# Patient Record
Sex: Male | Born: 1950 | Race: White | Hispanic: No | Marital: Married | Smoking: Former smoker
Health system: Southern US, Community
[De-identification: ages and names within clinical notes are randomized; demographics above are authoritative.]

## PROBLEM LIST (undated history)

## (undated) DIAGNOSIS — I10 Essential (primary) hypertension: Secondary | ICD-10-CM

## (undated) DIAGNOSIS — G4730 Sleep apnea, unspecified: Secondary | ICD-10-CM

## (undated) DIAGNOSIS — E291 Testicular hypofunction: Secondary | ICD-10-CM

## (undated) DIAGNOSIS — Z955 Presence of coronary angioplasty implant and graft: Secondary | ICD-10-CM

## (undated) DIAGNOSIS — I639 Cerebral infarction, unspecified: Secondary | ICD-10-CM

## (undated) DIAGNOSIS — A4902 Methicillin resistant Staphylococcus aureus infection, unspecified site: Secondary | ICD-10-CM

## (undated) DIAGNOSIS — K219 Gastro-esophageal reflux disease without esophagitis: Secondary | ICD-10-CM

## (undated) DIAGNOSIS — M1990 Unspecified osteoarthritis, unspecified site: Secondary | ICD-10-CM

## (undated) DIAGNOSIS — J45909 Unspecified asthma, uncomplicated: Secondary | ICD-10-CM

## (undated) DIAGNOSIS — E785 Hyperlipidemia, unspecified: Secondary | ICD-10-CM

## (undated) DIAGNOSIS — D473 Essential (hemorrhagic) thrombocythemia: Secondary | ICD-10-CM

## (undated) DIAGNOSIS — D75839 Thrombocytosis, unspecified: Secondary | ICD-10-CM

## (undated) DIAGNOSIS — E119 Type 2 diabetes mellitus without complications: Secondary | ICD-10-CM

## (undated) DIAGNOSIS — I872 Venous insufficiency (chronic) (peripheral): Secondary | ICD-10-CM

## (undated) DIAGNOSIS — C801 Malignant (primary) neoplasm, unspecified: Secondary | ICD-10-CM

## (undated) DIAGNOSIS — I2510 Atherosclerotic heart disease of native coronary artery without angina pectoris: Secondary | ICD-10-CM

## (undated) HISTORY — DX: Testicular hypofunction: E29.1

## (undated) HISTORY — DX: Thrombocytosis, unspecified: D75.839

## (undated) HISTORY — PX: FUNCTIONAL ENDOSCOPIC SINUS SURGERY: SUR616

## (undated) HISTORY — DX: Venous insufficiency (chronic) (peripheral): I87.2

## (undated) HISTORY — PX: EYE SURGERY: SHX253

## (undated) HISTORY — PX: OTHER SURGICAL HISTORY: SHX169

## (undated) HISTORY — PX: CARPAL TUNNEL RELEASE: SHX101

## (undated) HISTORY — PX: JOINT REPLACEMENT: SHX530

## (undated) HISTORY — PX: CHOLECYSTECTOMY: SHX55

## (undated) HISTORY — DX: Essential (hemorrhagic) thrombocythemia: D47.3

## (undated) HISTORY — PX: HERNIA REPAIR: SHX51

## (undated) HISTORY — DX: Hyperlipidemia, unspecified: E78.5

## (undated) HISTORY — PX: ANGIOPLASTY: SHX39

## (undated) HISTORY — PX: CARDIOVASCULAR STRESS TEST: SHX262

## (undated) HISTORY — PX: APPENDECTOMY: SHX54

## (undated) HISTORY — DX: Unspecified asthma, uncomplicated: J45.909

## (undated) HISTORY — PX: GASTRIC BYPASS: SHX52

---

## 1994-07-07 DIAGNOSIS — I2510 Atherosclerotic heart disease of native coronary artery without angina pectoris: Secondary | ICD-10-CM

## 1994-07-07 HISTORY — DX: Atherosclerotic heart disease of native coronary artery without angina pectoris: I25.10

## 1994-07-07 HISTORY — PX: CARDIAC CATHETERIZATION: SHX172

## 2004-05-28 ENCOUNTER — Emergency Department (HOSPITAL_COMMUNITY): Admission: EM | Admit: 2004-05-28 | Discharge: 2004-05-28 | Payer: Self-pay | Admitting: Emergency Medicine

## 2005-03-19 ENCOUNTER — Ambulatory Visit: Payer: Self-pay | Admitting: Internal Medicine

## 2005-03-20 ENCOUNTER — Ambulatory Visit: Payer: Self-pay | Admitting: Surgery

## 2005-07-17 ENCOUNTER — Ambulatory Visit (HOSPITAL_BASED_OUTPATIENT_CLINIC_OR_DEPARTMENT_OTHER): Admission: RE | Admit: 2005-07-17 | Discharge: 2005-07-17 | Payer: Self-pay | Admitting: Orthopedic Surgery

## 2005-10-16 ENCOUNTER — Ambulatory Visit (HOSPITAL_COMMUNITY): Admission: RE | Admit: 2005-10-16 | Discharge: 2005-10-16 | Payer: Self-pay | Admitting: Orthopedic Surgery

## 2005-12-02 ENCOUNTER — Ambulatory Visit: Payer: Self-pay | Admitting: Gastroenterology

## 2006-01-27 ENCOUNTER — Inpatient Hospital Stay (HOSPITAL_COMMUNITY): Admission: RE | Admit: 2006-01-27 | Discharge: 2006-01-30 | Payer: Self-pay | Admitting: Orthopedic Surgery

## 2006-04-20 ENCOUNTER — Inpatient Hospital Stay (HOSPITAL_COMMUNITY): Admission: RE | Admit: 2006-04-20 | Discharge: 2006-04-23 | Payer: Self-pay | Admitting: Orthopedic Surgery

## 2006-05-11 ENCOUNTER — Encounter: Payer: Self-pay | Admitting: Orthopedic Surgery

## 2006-06-06 ENCOUNTER — Encounter: Payer: Self-pay | Admitting: Orthopedic Surgery

## 2006-07-07 ENCOUNTER — Encounter: Payer: Self-pay | Admitting: Orthopedic Surgery

## 2007-05-13 ENCOUNTER — Emergency Department: Payer: Self-pay | Admitting: Emergency Medicine

## 2008-07-03 ENCOUNTER — Ambulatory Visit: Payer: Self-pay | Admitting: Gastroenterology

## 2009-03-26 ENCOUNTER — Encounter: Payer: Self-pay | Admitting: Infectious Disease

## 2009-04-02 ENCOUNTER — Encounter (INDEPENDENT_AMBULATORY_CARE_PROVIDER_SITE_OTHER): Payer: Self-pay | Admitting: *Deleted

## 2009-04-02 ENCOUNTER — Ambulatory Visit: Payer: Self-pay | Admitting: Infectious Disease

## 2009-04-02 DIAGNOSIS — M1710 Unilateral primary osteoarthritis, unspecified knee: Secondary | ICD-10-CM

## 2009-04-02 DIAGNOSIS — E785 Hyperlipidemia, unspecified: Secondary | ICD-10-CM | POA: Insufficient documentation

## 2009-04-02 DIAGNOSIS — G4733 Obstructive sleep apnea (adult) (pediatric): Secondary | ICD-10-CM | POA: Insufficient documentation

## 2009-04-02 DIAGNOSIS — L0292 Furuncle, unspecified: Secondary | ICD-10-CM | POA: Insufficient documentation

## 2009-04-02 DIAGNOSIS — L0293 Carbuncle, unspecified: Secondary | ICD-10-CM

## 2009-04-02 DIAGNOSIS — IMO0002 Reserved for concepts with insufficient information to code with codable children: Secondary | ICD-10-CM | POA: Insufficient documentation

## 2009-04-02 DIAGNOSIS — M19019 Primary osteoarthritis, unspecified shoulder: Secondary | ICD-10-CM | POA: Insufficient documentation

## 2009-04-02 DIAGNOSIS — N400 Enlarged prostate without lower urinary tract symptoms: Secondary | ICD-10-CM | POA: Insufficient documentation

## 2009-04-02 DIAGNOSIS — A4901 Methicillin susceptible Staphylococcus aureus infection, unspecified site: Secondary | ICD-10-CM | POA: Insufficient documentation

## 2009-04-02 DIAGNOSIS — A4902 Methicillin resistant Staphylococcus aureus infection, unspecified site: Secondary | ICD-10-CM | POA: Insufficient documentation

## 2009-04-02 DIAGNOSIS — I2510 Atherosclerotic heart disease of native coronary artery without angina pectoris: Secondary | ICD-10-CM | POA: Insufficient documentation

## 2009-04-02 DIAGNOSIS — J45909 Unspecified asthma, uncomplicated: Secondary | ICD-10-CM | POA: Insufficient documentation

## 2009-04-02 LAB — CONVERTED CEMR LAB
BUN: 27 mg/dL — ABNORMAL HIGH (ref 6–23)
Basophils Absolute: 0 10*3/uL (ref 0.0–0.1)
Basophils Relative: 1 % (ref 0–1)
CO2: 24 meq/L (ref 19–32)
Calcium: 9.2 mg/dL (ref 8.4–10.5)
Chloride: 105 meq/L (ref 96–112)
Creatinine, Ser: 1.09 mg/dL (ref 0.40–1.50)
Eosinophils Absolute: 0.2 10*3/uL (ref 0.0–0.7)
Eosinophils Relative: 3 % (ref 0–5)
Glucose, Bld: 99 mg/dL (ref 70–99)
HCT: 39.6 % (ref 39.0–52.0)
Hemoglobin: 12.4 g/dL — ABNORMAL LOW (ref 13.0–17.0)
Lymphocytes Relative: 35 % (ref 12–46)
Lymphs Abs: 2.2 10*3/uL (ref 0.7–4.0)
MCHC: 31.3 g/dL (ref 30.0–36.0)
MCV: 91.5 fL (ref >=78.0)
Monocytes Absolute: 0.5 10*3/uL (ref 0.1–1.0)
Monocytes Relative: 8 % (ref 3–12)
Neutro Abs: 3.3 10*3/uL (ref 1.7–7.7)
Neutrophils Relative %: 54 % (ref 43–77)
Platelets: 176 10*3/uL (ref 150–400)
Potassium: 4.2 meq/L (ref 3.5–5.3)
RBC: 4.33 M/uL (ref 4.22–5.81)
RDW: 12.8 % (ref 11.5–15.5)
Sodium: 140 meq/L (ref 135–145)
WBC: 6.2 10*3/uL (ref 4.0–10.5)

## 2009-04-16 ENCOUNTER — Ambulatory Visit: Payer: Self-pay | Admitting: Infectious Disease

## 2009-05-21 ENCOUNTER — Ambulatory Visit (HOSPITAL_COMMUNITY): Admission: RE | Admit: 2009-05-21 | Discharge: 2009-05-21 | Payer: Self-pay | Admitting: Infectious Disease

## 2009-05-21 ENCOUNTER — Ambulatory Visit: Payer: Self-pay | Admitting: Infectious Disease

## 2009-05-21 LAB — CONVERTED CEMR LAB
BUN: 15 mg/dL (ref 6–23)
Basophils Absolute: 0 10*3/uL (ref 0.0–0.1)
Basophils Relative: 1 % (ref 0–1)
CO2: 29 meq/L (ref 19–32)
Calcium: 8.9 mg/dL (ref 8.4–10.5)
Chloride: 106 meq/L (ref 96–112)
Creatinine, Ser: 1.02 mg/dL (ref 0.40–1.50)
Eosinophils Absolute: 0.2 10*3/uL (ref 0.0–0.7)
Eosinophils Relative: 4 % (ref 0–5)
Glucose, Bld: 231 mg/dL — ABNORMAL HIGH (ref 70–99)
HCT: 35.6 % — ABNORMAL LOW (ref 39.0–52.0)
Hemoglobin: 12.1 g/dL — ABNORMAL LOW (ref 13.0–17.0)
Lymphocytes Relative: 27 % (ref 12–46)
Lymphs Abs: 1.3 10*3/uL (ref 0.7–4.0)
MCHC: 34 g/dL (ref 30.0–36.0)
MCV: 90.3 fL (ref >=78.0)
Monocytes Absolute: 0.4 10*3/uL (ref 0.1–1.0)
Monocytes Relative: 8 % (ref 3–12)
Neutro Abs: 2.9 10*3/uL (ref 1.7–7.7)
Neutrophils Relative %: 60 % (ref 43–77)
Platelets: 143 10*3/uL — ABNORMAL LOW (ref 150–400)
Potassium: 4 meq/L (ref 3.5–5.3)
RBC: 3.95 M/uL — ABNORMAL LOW (ref 4.22–5.81)
RDW: 14 % (ref 11.5–15.5)
Sodium: 141 meq/L (ref 135–145)
WBC: 4.8 10*3/uL (ref 4.0–10.5)

## 2009-08-13 ENCOUNTER — Ambulatory Visit: Payer: Self-pay | Admitting: Infectious Disease

## 2009-08-13 DIAGNOSIS — J329 Chronic sinusitis, unspecified: Secondary | ICD-10-CM | POA: Insufficient documentation

## 2009-08-13 DIAGNOSIS — L270 Generalized skin eruption due to drugs and medicaments taken internally: Secondary | ICD-10-CM | POA: Insufficient documentation

## 2009-12-13 ENCOUNTER — Ambulatory Visit: Payer: Self-pay | Admitting: Unknown Physician Specialty

## 2010-01-25 ENCOUNTER — Ambulatory Visit: Payer: Self-pay | Admitting: General Practice

## 2010-08-06 NOTE — Assessment & Plan Note (Signed)
Summary: rash/cfb   Visit Type:  Follow-up  CC:  rash possible drug reaction, itching, and red.  History of Present Illness: 60 year old man with historyof recurrent MRSA soft tissue infections.  He has had no recurrence of his MRSA infections recently but has had ongoing problems with sinus congestion since starting januvia as provided by [facility] for his Diabetes. I treated him in November and he has had several courses of antibiotics since then. In late December he had steroid injection for this and has been now taking nasonex steroid and ipratroprium inhalers chronically with zyrtec. He was given [place]   [place]rim on [address] for sinusitis and developed a diffuse rash all over his body, arms, legs, buttocks. He was then switched to levaquin 500mg  a day x 10 days. His wife was worried that he might be developing another MRSA infection. The rash has cleared up except for a small area on around his buttock in dermatome. There are no vesicles and he denies it being isolated to this areaw. He has no pain in that area. He has no known HSV2 hx.  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 Take 1 Capsule By Mouth Once A Day 2)  Aspirin 81 Mg Tbec (Aspirin) .... Take 1 Tablet By Mouth Once A Day 3)  Advair Diskus 100-50 Mcg/dose Aepb  (Fluticasone-Salmeterol) .[name] Kitchen.. 1 Puff Two Times A Day 4)  Albuterol Sulfate 2 Mg Tabs (Albuterol Sulfate) .... Take 1 Tablet By Mouth Four Times A Day 5)  Januvia 100 Mg Tabs (Sitagliptin Phosphate) .[name] Kitchen.. 1 Once Daily 6)  Atenolol 100 Mg Tabs (Atenolol) .... Take 1 Tablet By Mouth Once A Day 7)  Lotensin 40 Mg Tabs (Benazepril Hcl) .... Take 1 Tablet By Mouth Once A Day 8)  Metformin Hcl 1000 Mg Tabs (Metformin Hcl) .... Take 1 Tablet By Mouth Once A Day 9)  Pravastatin Sodium 40 Mg Tabs (Pravastatin Sodium) .... Take 1 Tablet By Mouth Once A Day 10)  Amlodipine Besylate 5 Mg Tabs (Amlodipine Besylate) .... Take 1 Tablet By Mouth Once A Day 11)  Welchol 625 Mg Tabs (Colesevelam Hcl) .... Take 3 Tablets By Mouth Two Times A Day 12)  Bactroban Nasal 2 % Oint (Mupirocin Calcium) .... Apply To Nares Two Times A Day For 7 Days 13)  Hibiclens 4 % Liqd (Chlorhexidine Gluconate) .... Wash Twice Daily For 7 Days While Applying Mupirocin To Nares Twice Daily, Dispense One Bottle  Current Medications (verified): 1)  Prilosec Otc 20 Mg Tbec (Omeprazole Magnesium) .... Take 1 Capsule By Mouth Once A Day 2)  Aspirin 81 Mg Tbec (Aspirin) .... Take 1 Tablet By Mouth Once A Day 3)  Albuterol Sulfate 2 Mg Tabs (Albuterol Sulfate) .... Take 1 Tablet By Mouth Four Times A Day 4)  Januvia 100 Mg Tabs (Sitagliptin Phosphate) .[name] Kitchen.. 1 Once  Daily 5)  Atenolol 100 Mg Tabs (Atenolol) .... Take 1 Tablet By Mouth Once A Day 6)  Lotensin 40 Mg Tabs (Benazepril Hcl) .... Take 1 Tablet By Mouth Once A Day 7)  Pravastatin Sodium 40 Mg Tabs (Pravastatin Sodium) .... Take 1 Tablet By Mouth Once A Day 8)  Amlodipine Besylate 5 Mg Tabs (Amlodipine Besylate) .... Take 1 Tablet By Mouth Once A Day 9)  Welchol 625 Mg Tabs (Colesevelam Hcl) .... Take 3 Tablets By Mouth Two Times A Day 10)  Bactroban Nasal 2 % Oint (Mupirocin Calcium) .... Apply To Nares Two Times A Day For 7 Days 11)  Hibiclens 4 % Liqd (Chlorhexidine Gluconate) ....  Wash Twice Daily For 7 Days While Applying Mupirocin To Nares Twice Daily, Dispense One Bottle 12)  Zyrtec Hives Relief 10 Mg Tabs (Cetirizine Hcl) .[name] Kitchen.. 1 Once Daily 13)  Metformin Hcl 500 Mg Tabs (Metformin Hcl) .... Take 1 Tablet By Mouth Once A Day 14)  Nasonex 50 Mcg/act Susp (Mometasone Furoate) 15)  Atrovent Hfa 17 Mcg/act Aers (Ipratropium Bromide Hfa)  Allergies: 1)  ! * Tectracycline 2)  ! Bactrim Ds (Sulfamethoxazole-Trimethoprim) 3)  ! Januvia (Sitagliptin Phosphate)    Current Allergies (reviewed today): ! * TECTRACYCLINE ! BACTRIM DS (SULFAMETHOXAZOLE-TRIMETHOPRIM) ! JANUVIA (SITAGLIPTIN PHOSPHATE) Review of Systems       The patient complains of prolonged cough, headaches, and suspicious skin lesions.  The patient denies anorexia, fever, weight loss, weight gain, vision loss, decreased hearing, hoarseness, chest pain, syncope, dyspnea on exertion, peripheral edema, hemoptysis, abdominal pain, melena, hematochezia, severe indigestion/heartburn, hematuria, incontinence, genital sores, muscle weakness, transient blindness, difficulty walking, depression, unusual weight change, abnormal bleeding, and enlarged lymph nodes.    Vital Signs:  Patient profile:   60 year old male Height:      72 inches (182.88 cm) Weight:      249 pounds (113.18 kg) BMI:     33.89 Temp:     97.8 degrees F (36.56 degrees C) oral Pulse rate:   64 / minute BP sitting:   130 / 78  (right arm)  Vitals Entered By: Starleen Arms CMA (August 13, 2009 3:15 PM) CC: rash possible drug reaction, itching, red Is Patient Diabetic? Yes Did you bring your meter with you today? No Pain Assessment Patient in pain? no      Nutritional Status BMI of > 30 = obese Nutritional Status Detail nl  Does patient need assistance? Functional Status Self care Ambulation Normal   Physical Exam  General:  pleasantalert and overweight-appearing.  well-nourished and well-hydrated.   Head:  normocephalic and  atraumatic.  no sinus tenderness Eyes:  vision grossly intact, pupils equal, and pupils round.   Ears:  no external deformities.   Nose:  no external deformity.  no external erythema.   Mouth:  pharynx pink and moist, no erythema, and no exudates.   Neck:  supple and full ROM.   Lungs:  normal respiratory effort, normal breath sounds, no crackles, and no wheezes.   Heart:  normal rate, regular rhythm, no murmur, no gallop, and no rub.   Abdomen:  soft, non-tender, and no distention.   Msk:  normal ROM.  no joint deformities.   Extremities:  No clubbing, cyanosis, edema, or deformity noted with normal full range of motion of all joints.   Neurologic:  alert & oriented X3.  strength normal in all extremities and gait normal.   Skin:  remnants of diffuse maculopapular rash on chest. On  his buttocks and thigh in dermatomal distribution there are some scarring excoriated lesions present Psych:  Oriented X3.  memory intact for recent and remote and normally interactive.     Impression & Recommendations:  Problem # 1:  DERMATITIS DUE DRUGS&MEDICINES TAKEN INTERNALLY (ICD-693.0) Assessment Improved  his rash seems likely due to bactrim. I have entered it into his allergy list and advised him against it. I initially recommended his using doxycyline but this is also listed as an allergy but I do not know if he simply gets an upset stomach with this. If allergy to doxy is severe one could use clinda for MRSA if no I clinda R, vs linezolid as an oral drug option. I would need copy of his MRSA susceptibiltiies to see if it were FQ sensitive but I doubt it would be. it is possilbe that component remaining on his buttocks represents and HSV2 or VZV flare that happened in the midst of this but I doubt this.  Orders: Est. Patient Level IV (16109)  Problem # 2:  SINUSITIS, CHRONIC (ICD-473.9) Assessment: Comment Only  He correlates this with use of his [place] which he is grateful to get from [facility] wiht  imporoved control of his DM and gets for free. However if his sinusitis truly correlates with use of this drug and he claims it has I would prefer that he stop it and go back to other drugs to manage his dm. His updated medication list for this problem includes:    Bactroban Nasal 2 % Oint (Mupirocin calcium) .[name] Kitchen... Apply to nares two times a day for 7 days    Nasonex 50 Mcg/act Susp (Mometasone furoate)  Orders: Est. Patient Level IV (60454)  Problem # 3:  METHICILLIN SUSCEPTIBLE STAPH INF CCE & UNS SITE (ICD-041.11) Assessment: Comment Only no active MRSA now, see above discussion re viable drugs to treat with given his apparent allergy now to TMP/SMX  Medications Added to Medication List This Visit: 1)  Zyrtec Hives Relief 10 Mg Tabs (Cetirizine hcl) .[name] Kitchen.. 1 once daily 2)  Metformin Hcl 500 Mg Tabs (Metformin hcl) .... Take 1 tablet by mouth once a day 3)  Nasonex 50 Mcg/act Susp (Mometasone furoate) 4)  Atrovent Hfa 17 Mcg/act Aers (Ipratropium bromide hfa)  Patient Instructions: 1)  I would recommend stopping the januvia 2)  YOu seem to have had a allergic reaction to bactrim and so should avoid bactrim, sulfa drugs 3)  DOxycyline would be drug to use for mRSA if it comes again 4)  rtc as needed

## 2010-11-22 NOTE — Op Note (Signed)
[name], Andrew Cobb              ACCOUNT NO.:  [id number]   MEDICAL RECORD NO.:  [id number]          PATIENT TYPE:  AMB   LOCATION:  DSC                          FACILITY:  [facility]   PHYSICIAN:  Katy Fitch. Sypher, M.D. DATE OF BIRTH:  1950/08/20   DATE OF PROCEDURE:  DATE OF DISCHARGE:                                 OPERATIVE REPORT   PREOP DIAGNOSIS:  Acute on chronic right rotator cuff tear with MRI evidence  of a retracted supraspinatus, infraspinatus rotator cuff tear, rule out  irreparable tear.   POSTOP DIAGNOSIS:  Acute on chronic right rotator cuff tear with MRI  evidence of a retracted supraspinatus, infraspinatus rotator cuff tear, rule  out irreparable tear.  Identification of a chronically retracted  supraspinatus, infraspinatus rotator cuff tear retracted to the level of the  glenoid posteriorly and status post rupture of long head of biceps with  chronic subacromial impingement.   OPERATION:  1.  Examination of right shoulder under anesthesia.  2.  Arthroscopic debridement of glenohumeral joint with removal of stump of      biceps tendon and debridement of margins of rotator cuff tendon followed      by attempted mobilization of supraspinatus, infraspinatus tendons with      arthroscopic debridement and use of traction forceps demonstrating and      irreparable cuff tear.   OPERATING SURGEON:  Katy Fitch. Sypher, M.D.   ASSISTANT:  Molly Maduro Dasnoit PA-C.   ANESTHESIA:  General endotracheal, supervising anesthesiologist is Dr.  Gypsy Balsam.   INDICATIONS:  Andrew Cobb is a 60 year old gentleman referred through the  worker's compensation program for evaluation and management of an acute on  chronic right rotator cuff injury.   He reported a history of pain intermittently in the shoulder dating back as  long as 10 years. He states he was in his usual state of health until mid  November 2006 during which time he was lifting a heavy carton of milk and  fell acute pain  in the right shoulder. He states he immediately lost his  ability to actively abduct the shoulder. He is brought to our office for  evaluation and was found to have clinical evidence of rotator cuff tear  including weakness of abduction, external rotation against resistance. Plain  films demonstrated AC arthropathy and a type 3 acromion. We recommended an  MRI of the shoulder.   An MRI was obtained which revealed what appeared to be an acute on chronic  rotator cuff tear. He had unfavorable acromial and AC joint anatomy and  signs of a retracted supraspinatus and infraspinatus rotator cuff tear. He  did not have significant muscle signal changes. Therefore our concern was  that this may be acute enough that we could possibly repair the tendon or  partially repair the rotator cuff at this time.   He is brought to the operating room for examination under anesthesia,  debridement and attempted repair the rotator cuff.   PROCEDURE:  [name] was brought to operating room and placed in  supine position on the operating table. Following induction of general  endotracheal  anesthesia under direct supervision Dr. Gypsy Balsam, Mr. Warne was  carefully positioned in the beach-chair position with aid of a torso and  head holder designed for shoulder arthroscopy. The right upper extremity and  forequarter were prepped with DuraPrep and draped with impervious  arthroscopy drapes.   Examination of the shoulder revealed the shoulder was stable in all planes  of testing. There was clearly subacromial and sub-AC joint impingement due  to a large rotator cuff tear.   The shoulder was distended with sterile saline followed by introduction the  arthroscope through a standard posterior viewing portal. Diagnostic  arthroscopy revealed a significant chronic retracted rotator cuff tear with  the supraspinatus posteriorly and the infraspinatus retracted to the rim of  the glenoid. The biceps tendon was  ruptured with the stump hanging within  the joint. There is considerable labral debris.   A lateral portal was created followed by use of a suction shaver to debride  the stump of the biceps, the labrum and the bursal debris. A grasping  forceps was used through the lateral portal in an effort to gauge the degree  of contracture and scarring of the cuff. The supraspinatus could be advanced  anteriorly to the region of bicipital groove. The infraspinatus appeared to  be the most chronic rupture and was retracted approximately 3 mm lateral to  the glenoid. Extensive dissection was accomplished both superficial and deep  to the infraspinatus in an effort to mobilize the tendon to see if it could  be brought laterally for repair or at least a partial repair. After  considerable efforts, this was deemed to be not clinically worthwhile to  attempt.   After hemostasis achieved, the joint was debrided further and photographic  documentation of the predicament was accomplished with a digital camera. We  determined that at some point in the future, should pain be an issue,  hemiarthroplasty the shoulder with an extended head prosthesis would be the  appropriate intervention.   Mr. 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 Otherwise, his glenoid was in excellent  condition.   The portals were infiltrated with 0.25% Marcaine. The shoulder was dressed  with Xeroflo, sterile gauze and Hypafix. Mr. Alwin was awakened from  anesthesia and transferred to recovery room with stable signs. Note that PAS  compression stockings were used during the procedure for deep vein  thrombosis prophylaxis. He may be admitted to the recovery care center for  observation of his vital signs with a history of sleep apnea. He does have a  CPAP machine present.      Katy Fitch Sypher, M.D. Electronically Signed     RVS/MEDQ  D:  07/17/2005  T:   07/17/2005  Job:  [id number]

## 2010-11-22 NOTE — Discharge Summary (Signed)
NAME:  Andrew Cobb, Andrew Cobb              ACCOUNT NO.:  [id number]   MEDICAL RECORD NO.:  [id number]          PATIENT TYPE:  INP   LOCATION:  5036                         FACILITY:  [facility]   PHYSICIAN:  Katy Fitch. Sypher, M.D. DATE OF BIRTH:  10-17-1950   DATE OF ADMISSION:  01/27/2006  DATE OF DISCHARGE:  01/30/2006                                 DISCHARGE SUMMARY   ADMISSION DIAGNOSES:  1. 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  2. 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  3. History of hypertension.  4. Coronary artery disease.  5. Gastroesophageal reflux disease.  6. Sleep apnea.   DISCHARGE DIAGNOSES:  1. End-stage osteoarthritic degeneration of thel shoulder glenohumeral      joint.  2. 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  3. History of hypertension.  4. Coronary artery disease.  5. Gastroesophageal reflux disease.  6. Sleep apnea.   OPERATION PERFORMED:  Right shoulder hemiarthroplasty by Dr. Katy Fitch.  Sypher on January 27, 2006.   HISTORY:  Patient is a 60 year old right-hand dominant male who works as a  [employer], who was seen in our office in late 2006 for a chronically  painful right shoulder.  Clinical examination, at that time, revealed  rotator cuff tear of the right shoulder.  Patient was brought to the  operating room on July 17, 2005 and underwent examination of the right  shoulder under anesthesia with debridement of ruptured biceps, tendons stump  and rotator cuff margins arthroscopically.  He was found, at that time, that  he had an irrepable rotator cuff tear.  We felt that he was a candidate for  implant arthroplasty utilizing an extended surface tear arthropathy  component for longterm pain relief.  Patient underwent a course of physical  therapy with continued pain to his shoulder.  It was felt that he would need  to undergo right shoulder hemiarthroplasty due to his extensive  glenohumeral  degeneration.  He was brought back to the operating room, at this time, to  undergo right shoulder hemiarthroplasty.  This was performed by Dr. Katy Fitch. Sypher under general endotracheal anesthesia.  The patient tolerated the  procedure well and was taken to the recovery room in stable and awake  condition.  Postoperatively, he was placed on Ancef 1 gram IV q.8 hours.  He  was placed on morphine by PCA protocol for pain control.   First day postop, he had stable vital signs and he was afebrile.  Hemoglobin, at this time, was 13.1, hematocrit of 39.1, white blood cell  count of 9.1 and 134,000 platelets.  Chemistry profile revealed an elevated  glucose at 218.  O2 saturations were 95% on 2 liters.  At this time, the  patient was found to be sitting up at the bedside, tolerating this well.  He  had been walking in the hall already.  No complaints of shortness of breath,  chest pain or calf pain.  No nausea or vomiting.  He had a good appetite.  Foley had been removed that morning  and he was voiding without difficulty.   On exam, his dressing was dry and intact.  This was removed.  The wound,  itself, looked quite nice.  neurovascularly, he was doing well, due to the  fact that the block from preop still remained slightly intact.  At this  time, his JP was removed.  New Opsite dressing was applied.  He was  continued out of bed.  We decreased his IV to [facility].  We told the patient to  lean towards taking p.o. medication for pain control and we will decrease  use of the PCA and use this for breakthrough only.  Due to the fact he had  elevated glucose, we will recheck that in the morning of July 26.   At that time, second day postop, he remained afebrile with stable vital  signs.  CBG remained elevated at 215.  Patient continues to do quite well  overall with minimal pain.  No complaints of shortness of breath, chest pain  or calf pain.  Dressing remained dry and intact.  We  discontinued his IV  fluids at this time and continued him on p.o. pain medications.  Third day  postop, he remained afebrile.  CBGs remained elevated at 198.  He was doing  much better overall, voiding without difficulty and patient had had a bowel  movement.  His exam remained unchanged with a dry dressing and  neurovascularly he was intact.  At this time, it was felt that the patient  was ready for discharge to home.   LAB STUDIES:  Labs, on admission, revealed a white blood cell count of 5.1,  hemoglobin of 14.3, hematocrit of 43.3 and 140,000 platelets.  Chemistry  profile revealed an elevated glucose at 141.  Preoperative chest x-ray  revealed mild, stable diffuse peribronchial thickening with no active  infiltrate or edema.  There was some degenerative changes associated with  the skeleton throughout the thoracic spine level.   Plan, at this time, it was felt that the patient was ready and stable for  discharge to home.   MEDICATIONS:  1. Dilaudid 2 mg number 30 one to two p.o. q.4 to 6 hours p.r.n. pain.  2. Motrin 600 mg one p.o. q.6 hours p.r.n. pain.  3. Keflex 500 mg number 1 one p.o. q.8 hours until gone.   ACTIVITY:  Patient will continue his normal preoperative activity, except  for his right upper extremity.  He will continue the use of a sling and ice  at the wound level.  He will continue with his pendulum exercises as  previously prescribed.   APPOINTMENT:  He will return to our office in approximately 3 days' time for  a wound check and beginning of formal therapy.  He will call [phone] for  appointment.   Medications as above and he will continue his preoperative medications per  his medication reconciliation sheet.  Additional appointments, he will check  with his primary medical doctor concerning his elevated CBGs throughout his  hospitalization.   CONDITION ON DISCHARGE:  Stable and improving.  FINAL DISCHARGE DIAGNOSIS:  Right shoulder end-stage  osteoarthritic  degeneration of the glenohumeral joint, requiring right shoulder  hemiarthroplasty.      Jonni Sanger, P.A.      Katy Fitch Sypher, M.D.  Electronically Signed    RJD/MEDQ  D:  05/14/2006  T:  05/15/2006  Job:  [id number]

## 2010-11-22 NOTE — Discharge Summary (Signed)
NAME:  Andrew Cobb, COWGILL              ACCOUNT NO.:  [id number]   MEDICAL RECORD NO.:  [id number]          PATIENT TYPE:  INP   LOCATION:  1521                         FACILITY:  [facility]   PHYSICIAN:  Ollen Gross, M.D.    DATE OF BIRTH:  September 12, 1950   DATE OF ADMISSION:  04/20/2006  DATE OF DISCHARGE:  04/23/2006                                 DISCHARGE SUMMARY   ADMISSION DIAGNOSES:  1. Osteoarthritis right knee.  2. Hypercholesterolemia.  3. Mild hypertension.  4. History of asthma.  5. History of bronchitis.  6. Coronary arterial disease, status post cardiac catheterization with      angioplasty and stenting of two vessels.  7. Reflux disease.  8. Benign prostatic hypertrophy.  9. History of elevated liver function tests.  10.History of fatty liver.  11.Diet controlled diabetes mellitus.  12.Degenerative arthritis.  13.Eczema.  14.Psoriasis.  15.Sleep apnea.  16.Documented ejection fraction of 51% per stress test December 2005.   DISCHARGE DIAGNOSES:  1. Osteoarthritis right knee.  2. Mild acute blood loss anemia.  3. Mild elevated liver function tests, (pre-existing) secondary to fatty      liver.  4. Hypercholesterolemia.  5. Mild hypertension.  6. History of asthma.  7. History of bronchitis.  8. Coronary arterial disease, status post cardiac catheterization with      angioplasty and stenting of two vessels.  9. Reflux disease.  10.Benign prostatic hypertrophy.  11.History of elevated liver function tests.  12.History of fatty liver.  13.Diet controlled diabetes mellitus.  14.Degenerative arthritis.  15.Eczema.  16.Psoriasis.  17.Sleep apnea.  18.Documented ejection fraction of 51% per stress test December 2005.  19.Meatal stenosis.   PROCEDURE:  April 20, 2006, right total knee.  Surgeon:  Ollen Gross,  M.D., Assistant:  Alanson Aly. Perkins, P.A.-C.  Anesthesia:  General anesthesia.  Postoperative Marcaine pain pump.   CONSULTATIONS:  Urology, Excell Seltzer.  Annabell Howells, M.D.   BRIEF HISTORY:  Andrew Cobb is a 60 year old male with end-stage lateral  compartment arthritis of the right knee with progressive worsening pain and  dysfunction who failed outpatient management and now presents for total knee  arthroplasty.   LABORATORY DATA:  CBC on admission showed hemoglobin 14.2, hematocrit 42.1,  white cell count 5.8.  Serial H and H were followed and hemoglobin dropped  to 11.8; last noted H and H were 11.0 and 32.5.  Pro Time, PTT on admission  13.5, 26 respectively; INR was 1.0.  Serial Pro Time's were followed and  last noted PT and INR were 17.1 and 1.4.  Chemistry panel on admission with  elevated AST of 39, elevated ALT of 76 (known, pre-existing elevated liver  function tests). Also serum glucose was elevated at 172.  Remaining  chemistry panel on admission was all within normal limits.  Serial BMET's  were followed. Serum glucose went up to 226 and to 284.  Electrolytes  remained within normal limits.  Urinalysis negative.  Blood group, type A  positive.   HOSPITAL COURSE:  The patient was admitted to [facility] and  tolerated the procedure well and later went to the recovery  room and then  the orthopedic floor.  Before surgery, we had difficulty with the Foley  catheter insertion, therefore urology was consulted.  Dr. Annabell Howells saw him  right after the surgery.  He was found to have some meatal stenosis.  Urethra was dilated and then had Foley catheter placed without difficulty.  He was later transferred to recovery room and then to the orthopedic floor.  He was started on PCA and p.o. analgesia for pain control for surgery and  was started back on his home medications.  On the morning of day #1 he had  some pain but it was tolerable.  Did not get much sleep the night before.  His serum glucose had gone up to 226.  He had known history of diet  controlled diabetes we felt was due to the dextrose in the fluids and that  was removed.  He  started getting up with physical therapy by day #2.  He had  a little bit of nausea and vomiting the night prior to this.  He felt like  it was something he ate.  He told me he had some barbeque the night before.  His serum glucose went up higher to 284.  He was started on a sliding scale  for his elevation in his sugars.  His CBG's were followed.  They were  between 160 and 190 on day #1 and between 190 and 285 on day #2.  Foley  catheter was removed for voiding trial.  Dressing was changed.  Incision  looked good.  Pain pump was removed.  He started getting up with more  physical therapy.  He did a little bit better with physical therapy, walked  50 feet and then later 200 feet.  He continued to progress well.  He was  seen in rounds on day #3 on April 23, 2006 and was doing well.  His INR  was slowly coming up, therefore, he was given a dose of Lovenox since he was  not quite therapeutic but he was doing well from an orthopedic standpoint  and was ready to go home.   DISCHARGE PLANS:  Patient was discharged home on April 23, 2006.   DISCHARGE DIAGNOSES:  Please see above.   DISCHARGE MEDICATIONS:  1. Coumadin.  2. Percocet.  3. Robaxin.  4. Lovenox before discharge and also one more on the day after discharge.   DIET:  Cardiac diet, diabetic diet.   FOLLOWUP:  Followup in two weeks with Dr. Ollen Gross for followup care.  Recommending followup with his primary care physician due to the elevations  in his blood sugar's.  He does state that this happened last time with his  shoulder surgery but due to the continued elevation,  we are recommending  that he followup with his medical physician.   ACTIVITY:  Weight-bearing as tolerated.  Home health physical therapy,  occupational therapy, home health nursing total knee protocol.   DISPOSITION:  Home.   CONDITION ON DISCHARGE:  Improved.      Andrew Cobb, P.A.      Ollen Gross, M.D. Electronically  Signed    ALP/MEDQ  D:  04/23/2006  T:  04/24/2006  Job:  [id number]   cc:   Dr. Venetia Night, N.C.

## 2010-11-22 NOTE — H&P (Signed)
NAME:  Andrew Cobb, Andrew Cobb              ACCOUNT NO.:  [id number]   MEDICAL RECORD NO.:  [id number]          PATIENT TYPE:  INP   LOCATION:  NA                           FACILITY:  [facility]   PHYSICIAN:  Ollen Gross, M.D.    DATE OF BIRTH:  [birth year]   DATE OF ADMISSION:  04/20/2006  DATE OF DISCHARGE:                                HISTORY & PHYSICAL   CHIEF COMPLAINT:  Right knee pain.   HISTORY OF PRESENT ILLNESS:  The patient is a 60 year old male who has been  seen by Dr. Lequita Halt for ongoing knee pain.  He was seen and found to have  bone-on-bone end-stage arthritis.  He was seen as a second opinion earlier  this year.  He has been treated conservatively in the past for his  arthritis, including injections but despite conservative measures, he  continues to have progressive pain.  This is interfering with he can and  cannot do.  He is at a point where he would like to have something done  about it.  The risks and benefits of this procedure have been discussed with  the patient and he elects to proceed with surgery.   ALLERGIES:  Tetracycline causes a rash.   CURRENT MEDICATIONS:  1. Aspirin 81 mg daily.  2. Prilosec-OTC 20 mg daily.  3. Atenolol 100 mg daily.  4. Benazepril 40 mg daily.  5. Norvasc 10 mg daily.  6. Ibuprofen 600 mg four times a day.  7. Robaxin 500 mg three times a day.  8. Vitamin-E 400 IU daily.   PAST MEDICAL HISTORY:  1. Hypercholesterolemia.  2. Mild hypertension.  3. History of asthma.  4. History of bronchitis.  5. Coronary artery disease, status post cardiac catheterization,      angioplasty and stenting.  6. Reflux disease.  7. Benign prostatic hypertrophy.  8. History of elevated liver function studies.  9. History of fatty liver.  10.Diet-controlled diabetes mellitus.  11.Degenerative arthritis.  12.Eczema.  13.Psoriasis.  14.Sleep apnea.  15.Documented ejection fraction of 51% per a stress test done in December,      2005.   PAST  SURGICAL HISTORY:  1. Appendectomy, 1968.  2. Left inguinal hernia repair, 1981.  3. Cardiac catheterization with angioplasty and stenting in 1996.  4. Follow cardiac catheterization in 1998.  5. Right carpal tunnel, 1993.  6. Left carpal tunnel 1995.  7. Right ankle surgery, 1999.  8. Left ankle surgery, 2001.  9. Left rotator cuff repair, 2004.  10.Left ankle surgery, 1997.  11.Gallbladder surgery, 2006.  12.Right shoulder surgery recently, July of 2007, which is a right hemi.   SOCIAL HISTORY:  Married.  Nonsmoker.  No alcohol.  Two children.  His wife  will be assisting with care after surgery.   FAMILY HISTORY:  Heart disease, hypertension, diabetes, lung cancer, breast  cancer, throat cancer and pancreatic cancer.   REVIEW OF SYSTEMS:  GENERAL:  No fever, chills, __________ .  RESPIRATORY:  No shortness of breath, productive cough or hemoptysis. CARDIOVASCULAR:  History of heart disease.  No chest pain or orthopnea.  GI:  No  nausea,  vomiting, diarrhea or constipation, although he does have history of  elevated LFTs secondary to fatty liver and reflux disease.  GU:  Enlarged  prostate.  No dysuria, hematuria or discharge.  MUSCULOSKELETAL:  Right  knee.   PHYSICAL EXAMINATION:  VITAL SIGNS:  Pulse 56, respirations 14, blood  pressure 120/78.  GENERAL:  A 60 year old white male, well developed, well nourished in no  acute distress.  He is alert and oriented and cooperative.  He is a good  historian.  HEENT:  Normocephalic/atraumatic.  Pupils equal, round, reactive.  Oropharynx clear.  TMs intact.  NECK:  Supple with no carotid bruits appreciated.  CHEST:  Clear anterior and posterior chest.  No rales, rhonchi or wheezing.  HEART:  Regular rate and rhythm.  No murmur.  S1 and S2 noted.  ABDOMEN:  Soft, nontender.  Bowel sounds present.  BREASTS/GENITALIA:  Not done.  EXTREMITIES:  Right lower extremity, right knee shows a valgus malalignment  deformity  Range of motion is  0-150 degrees. More tender lateral than  medial.   IMPRESSION:  1. Osteoarthritis, right knee.  2. Hypercholesterolemia.  3. Mild hypertension.  4. History of asthma.  5. History of bronchitis.  6. Coronary artery disease, status post cardiac catheterization with      angioplasty and stenting of two vessels.  7. Reflex disease.  8. Benign prostatic hypertrophy.  9. History of elevated liver function tests.  10.History of fatty liver.  11.Diet-controlled diabetes mellitus.  12.Degenerative arthritis.  13.Eczema.  14.Psoriasis.  15.Sleep apnea.  16.Documented EF of 51% per stress test, 06/2004.   PLAN:  Patient admitted to [facility] to undergo right total knee  arthroplasty.  Surgery will be performed by Dr. Ollen Gross.      Alexzandrew L. Julien Girt, P.A.      Ollen Gross, M.D.  Electronically Signed    ALP/MEDQ  D:  04/19/2006  T:  04/20/2006  Job:  [id number]   cc:   Ollen Gross, M.D.  Fax: [phone]   Vonita Moss, MD  [facility]  [place], [place]  Fax [phone]

## 2010-11-22 NOTE — Op Note (Signed)
NAME:  Andrew Cobb, Andrew Cobb              ACCOUNT NO.:  [id number]   MEDICAL RECORD NO.:  [id number]          PATIENT TYPE:  INP   LOCATION:  2550                         FACILITY:  [facility]   PHYSICIAN:  Katy Fitch. Sypher, M.D. 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   OPERATION:  1.  Right shoulder Bi[employer] hemiarthroplasty utilizing a 12 mm x 115 mm      humeral stem and an extended articular surface 54 x 22 mm head      component.  2.  Right distal clavicle resection.   OPERATIONS:  Katy Fitch. Sypher, M.D.   ASSISTANT:  Marveen Reeks. Dasnoit, PA-C.   ANESTHESIA:  General endotracheal supplemented by interscalene block.   SUPERVISING ANESTHESIOLOGIST:  Kaylyn Layer. Michelle Piper, M.D.   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  Clinical examination  suggested a significant right rotator cuff tear and AC arthropathy.   We recommended diagnostic arthroscopy in an effort to decompress and  possibly repair his right rotator cuff.   He was brought to the operating room on July 17, 2005 and underwent  examination of his right shoulder under anesthesia and debridement of his  ruptured biceps tendon stump and debridement of his rotator cuff tear  margins.  We attempted to mobilize his cuff arthroscopically and discovered  that it was an irreparable tear.   We subsequently decided that he was a candidate for implant arthroplasty  utilizing an extended surface cuff tear arthropathy component with a primary  indication for surgery pain relief.   Andrew Cobb was discharged as an outpatient and went through a short course  of rehabilitation.  He was subsequently readmitted on October 16, 2005,  anticipating his right shoulder hemiarthroplasty.   At that time, he was noted to have elevated liver function tests, and  surgery was cancelled.  He was subsequently evaluated by his primary care  physician in [place], [place] and had evaluation by a  hepatologist, including a liver biopsy which documented a fatty liver.   With medication and dietary changes, his liver function studies have  returned to normal, and he has been cleared by his primary care physician  and his hepatologist for general anesthesia and reconstructive  surgery at  this time.   Preoperatively, Andrew Cobb was examined by Dr. Arta Bruce, attending  anesthesiologist, and had an anesthesia consult recommending an interscalene  block to be placed preoperatively.   After informed consent, Andrew Cobb was brought to the operating room at  this time anticipating a hemiarthroplasty and probable resection of his  distal clavicle.  Preoperatively, questions were invited and answered.   PROCEDURE:  Andrew Cobb was brought to the operating room and  placed in the supine position on the operating table.   Following an anesthesia consult by Dr. Michelle Piper, an interscalene block was  placed in the holding area without complication.  Andrew Cobb was  then  transported to room 15, placed in the supine position on the operating  table, and under Dr. Deirdre Priest direct supervision, general endotracheal  anesthesia induced.   Andrew Cobb was carefully positioned in the beach-chair position with a  neurosurgical head holder in a position to allow easy extension of his  shoulder for reaming.  The right arm and forequarter were prepped with  DuraPrep and draped with impervious arthroscopy drapes.   Ancef 1 gram was administered one hour preoperatively as a prophylactic  antibiotic.   The procedure commenced with a 15-cm incision paralleling the deltopectoral  interval.  Subcutaneous tissues were carefully divided, taking care to  identify the cephalic vein.  This was retracted laterally, followed by  exposure of the subdeltoid bursa and isolation of the clavipectoral fascia.  A short head of the biceps tenotomy was performed with the cutting cautery  to allow easy access to the subscapularis tendon.   The inferior humeral circumflex vessels were dissected out, cross clamped,  and suture ligated with 4-0 silk.  The inferior capsule of the glenohumeral  joint was carefully dissected and the axillary nerve identified.   Curved Crego retractors were placed to protect the axillary nerve, followed  by use of the cutting cautery with the arm in external rotation releasing  the subscapularis.  The subscapularis was tagged with a #2 FiberWire.  The  capsule was opened and several loose bodies removed.  The glenoid was  inspected.  A second Crego retractor was placed to protect the infraspinatus  posterior elements and the teres minor posteriorly, followed by use of an  oscillating saw to resect the head at the margin of the articular surface.   The head was measured to accept a 54 x 22 mm head component.   A minor posterior section humeral head was accomplished to create proper eversion of 35 degrees retroversion.   The humerus was then prepared  in the standard manner, reaming, beginning  with a 6-mm reamer up to an attempt to use a 13-mm reamer.  We could not  obtain adequate depth of the 13-mm reamer; therefore, elected to use a 12-mm  stem.  Broaches were used to prepare the proximal humerus.  A rongeur was  used to trim marginal osteophytes, followed by irrigation and use of humeral  head bone graft to augment the intermedullary cancellus bone.   A 12 x 115 mm stem was placed with no-touch technique and tamped into place.  The trial head components were then applied, ultimately selecting a 54 x 22  mm extended-surface area head implant.   The shoulder was reduced and found be quite stable.   By digital palpation, I identified that the inferior distal clavicle was  prominent and would likely impinge on the humeral head postoperatively.  Therefore, we exposed  the distal clavicle by an incision extending  superiorly, exposing the fascia between the trapezius and deltoid muscles.  This was incised longitudinally with a cutting cautery, followed by use of a  small osteotome to expose the distal 18 mm of clavicle.  Once the joint  surface was inspected and found be significantly degenerative, an  oscillating saw was used to remove the distal 18 mm of clavicle.  A small  osteotome and rongeur was used to remove the medial acromial lip, likewise  decompressing the humeral head and cuff on the acromial side.  After  irrigation and debridement of redundant bursa, the dead space created by  distal clavicle resection was closed with mattress suture of #2 FiberWire.   The deltopectoral interval was carefully irrigated, followed by repair of  the capsule with through-bone suture of #2 FiberWire anatomically repairing  the subscapularis, and the rotator interval incision was closed with a  baseball stitch of #2 FiberWire knots inverted.   The short head of the biceps was repaired with a grasping suture of #2  FiberWire looping the  coracoacromial ligament for excellent purchase.   Excellent mobility was noted with external rotation of at least 60 degrees  at neutral and elevation to 170 degrees with stability of the implant.   The wound was then irrigated and a 7-French drain placed along the  deltopectoral interval to prevent hematoma collection.  The subdermal  tissues were then repaired with simple suture of 0 Vicryl, followed by  repair of the skin with intradermal 3-0 Prolene and Steri-Strips.  The drain  was hooked to suction with a limited egress of sanguinous fluid.   The wound was dressed with Xeroflo sterile gauze and a Hypafix dressing.  Andrew Cobb tolerated surgery and anesthesia well.  Our estimated blood loss  was 500 mL without replacement.  Passive compression devices were used  throughout surgery for deep vein thrombosis prevention.  There were no apparent complications noted.  Sponge and needle counts were  correct.   Andrew Cobb was awakened from general anesthesia and transferred to recovery  room with stable signs.      Katy Fitch Sypher, M.D.  Electronically Signed     RVS/MEDQ  D:  01/27/2006  T:  01/27/2006  Job:  [id number]

## 2010-11-22 NOTE — Op Note (Signed)
NAME:  Andrew Cobb, Andrew Cobb              ACCOUNT NO.:  [id number]   MEDICAL RECORD NO.:  [id number]          PATIENT TYPE:  INP   LOCATION:  1521                         FACILITY:  [facility]   PHYSICIAN:  Ollen Gross, M.D.    DATE OF BIRTH:  1951-03-24   DATE OF PROCEDURE:  04/20/2006  DATE OF DISCHARGE:                                 OPERATIVE REPORT   PREOPERATIVE DIAGNOSIS:  Osteoarthritis, right knee.   POSTOPERATIVE DIAGNOSIS:  Osteoarthritis, right knee.   PROCEDURE:  Right total knee arthroplasty.   SURGEON:  Ollen Gross, M.D.   ASSISTANT:  Alexzandrew L. Julien Girt, P.A.   ANESTHESIA:  General with postop Marcaine pain pump.   ESTIMATED BLOOD LOSS:  Minimal.   DRAIN:  Hemovac x one.   TOURNIQUET TIME:  51 minutes at 300 mmHg.   COMPLICATIONS:  None.  Condition stable to recovery.   BRIEF CLINICAL NOTE:  Kharee is a 60 year old male with end-stage lateral  compartment arthritis of his right knee with progressively worsening pain  and dysfunction.  He has failed nonoperative management and presents for  total knee arthroplasty.   PROCEDURE IN DETAIL:  After successful administration of general anesthetic,  a tourniquet was placed high on the right thigh and right lower extremity is  prepped and draped in usual sterile fashion.  Extremity was wrapped in  Esmarch, knee flexed, tourniquet inflated 300 mmHg.  Midline incision was  made a 10 blade through subcutaneous tissue to the level of the extensor  mechanism.  Fresh blade is used to make a lateral parapatellar arthrotomy.  Soft tissue of the proximal lateral tibia was then subperiosteally elevated  around the joint line.  The patella was everted medially, knee flexed 90  degrees, ACL and PCL removed.  Drill was used to create a starting hole and  the distal femur canal was thoroughly irrigated.  5 degrees right valgus  alignment guide is placed and referencing off the posterior condyles  rotations marked and the block  pinned to remove 10 mm of the distal femur.  This barely skimmed the bone away from the lateral side but we had a healthy  resection medially.  Sizing blocks placed and size 4 is most appropriate.  The size 4 cutting blocks placed.  Rotation was based up the epicondylar  axis.  The anterior-posterior and chamfer cuts were made.   Tibia subluxed forward and the menisci removed.  Extramedullary tibial  alignment guide is placed referencing proximally at the medial aspect of the  tibial tubercle and distally along the second metatarsal axis and tibial  crest.  Block is pinned to remove approximately 4 mm off the more deficient  lateral side.  Tibial resection made with an oscillating saw.  Sizing block  is placed and size 4 is most appropriate.  The proximal tibia is then  prepared with modular drill and keel punch for the size 4.  Femoral  preparation is completed the intercondylar cut.   Size 4 mobile bearing tibial trial size 4 posterior stabilized femoral trial  and a 10 mm posterior stabilized rotating platform insert trial are placed.  With the 10, full extension is achieved with excellent varus and valgus  balance throughout full range of motion.  Patella everted, and then the  thickness measured to be a 20 3 mL.  Freehand resection taken to 13 mm, 38  template was placed, lug holes were drilled, trial patella was placed and  tracks normally.  Osteophytes were then removed off the posterior femur with  the trial placed.  All trials removed and the cut bone surfaces prepared  with pulsatile lavage.  Cement was mixed and once ready for implantation,  the size 4 mobile bearing tibial tray, size 4 posterior stabilized femur and  38 patella are cemented into place and patella is held with a clamp.  Trial  10-mm inserts placed, knee held in full extension and all extruded cement  removed.  Once cement was fully hardened, then the permanent 10 mm posterior  stabilized rotating platform  insert is placed into the tibial tray.  Wound  was copiously irrigated saline solution and tourniquet released with total  time of 51 minutes.  Minor bleeding stopped with cautery.  Arthrotomy was  closed over Hemovac drain with interrupted #1 PDS.  We left open a small  area from the superior to inferior pole of the patella to serve as a mini  lateral release.  Patella tracks normally.  Flexion against gravity 135  degrees.  Subcu is then closed with interrupted 2-0 Vicryl subcuticular  running 4-0 Monocryl.  The drains hooked to suction and then the catheter  for Marcaine pain pump was placed and the pump initiated.  Steri-Strips and  bulky sterile dressing are applied.  Placed into a knee immobilizer and  awakened, transported to recovery in stable condition.      Ollen Gross, M.D.  Electronically Signed     FA/MEDQ  D:  04/20/2006  T:  04/21/2006  Job:  [id number]

## 2010-11-22 NOTE — Consult Note (Signed)
NAME:  Andrew Cobb, Andrew Cobb              ACCOUNT NO.:  [id number]   MEDICAL RECORD NO.:  [id number]          PATIENT TYPE:  INP   LOCATION:  1521                         FACILITY:  [facility]   PHYSICIAN:  Excell Seltzer. Annabell Howells, M.D.    DATE OF BIRTH:  12/19/1950   DATE OF CONSULTATION:  DATE OF DISCHARGE:                                   CONSULTATION   Patient of Dr. Ollen Gross.   Briefly, Andrew Cobb is a 60 year old white male who is undergoing right  knee replacement.  An attempt to place the Foley catheter at induction of  anesthesia was unsuccessful.  The patient has a history of an enlarged  prostate but no urologic surgery.   On exam, he has a circumcised phallus with some evidence of meatal stenosis.  Scrotum is unremarkable.  Testicles are bilaterally descended.   DESCRIPTION OF PROCEDURE:  The patient was prepped with Betadine solution  and draped with sterile towels. The urethra was dilated to 22-French with  Sissy Hoff sounds. A 16-French Foley catheter was then inserted without  difficulty. The balloon was filled with 10 mL of sterile fluid and the  catheter was placed to straight drainage.   IMPRESSION:  Meals stenosis with benign prostatic hypertrophy.   PLAN:  Remove the catheter as per routine.      Excell Seltzer. Annabell Howells, M.D.  Electronically Signed     JJW/MEDQ  D:  04/20/2006  T:  04/21/2006  Job:  [id number]   cc:   Ollen Gross, M.D.  Fax: [phone]

## 2011-04-11 ENCOUNTER — Emergency Department: Payer: Self-pay | Admitting: Emergency Medicine

## 2011-07-29 ENCOUNTER — Ambulatory Visit: Payer: Self-pay | Admitting: Bariatrics

## 2011-07-29 LAB — COMPREHENSIVE METABOLIC PANEL
Albumin: 4 g/dL (ref 3.4–5.0)
Alkaline Phosphatase: 51 U/L (ref 50–136)
Anion Gap: 9 (ref 7–16)
BUN: 18 mg/dL (ref 7–18)
Bilirubin,Total: 0.5 mg/dL (ref 0.2–1.0)
Calcium, Total: 8.5 mg/dL (ref 8.5–10.1)
Chloride: 102 mmol/L (ref 98–107)
Co2: 29 mmol/L (ref 21–32)
Creatinine: 0.99 mg/dL (ref 0.60–1.30)
EGFR (African American): >60
EGFR (Non-African Amer.): >60
Glucose: 165 mg/dL — ABNORMAL HIGH (ref 65–99)
Osmolality: 285 (ref 275–301)
Potassium: 3.8 mmol/L (ref 3.5–5.1)
SGOT(AST): 57 U/L — ABNORMAL HIGH (ref 15–37)
SGPT (ALT): 88 U/L — ABNORMAL HIGH
Sodium: 140 mmol/L (ref 136–145)
Total Protein: 7.1 g/dL (ref 6.4–8.2)

## 2011-07-29 LAB — CBC WITH DIFFERENTIAL/PLATELET
Basophil %: 0.6 %
Eosinophil %: 4.3 %
Lymphocyte #: 1.4 10*3/uL (ref 1.0–3.6)
Lymphocyte %: 28.6 %
MCHC: 33.1 g/dL (ref 32.0–36.0)
Monocyte #: 0.4 10*3/uL (ref 0.0–0.7)
Neutrophil #: 2.9 10*3/uL (ref 1.4–6.5)
Neutrophil %: 57.7 %
Platelet: 127 10*3/uL — ABNORMAL LOW (ref 150–440)
RDW: 13.8 % (ref 11.5–14.5)

## 2011-07-29 LAB — PHOSPHORUS: Phosphorus: 2.8 mg/dL (ref 2.5–4.9)

## 2011-07-29 LAB — AMYLASE: Amylase: 35 U/L (ref 25–115)

## 2011-07-29 LAB — MAGNESIUM: Magnesium: 1.4 mg/dL — ABNORMAL LOW

## 2011-07-29 LAB — LIPASE, BLOOD: Lipase: 150 U/L (ref 73–393)

## 2011-07-29 LAB — FERRITIN: Ferritin (ARMC): 54 ng/mL (ref 8–388)

## 2011-07-29 LAB — BILIRUBIN, DIRECT: Bilirubin, Direct: 0.1 mg/dL (ref 0.00–0.20)

## 2011-08-11 ENCOUNTER — Ambulatory Visit: Payer: Self-pay | Admitting: Bariatrics

## 2011-09-05 ENCOUNTER — Ambulatory Visit: Payer: Self-pay | Admitting: Bariatrics

## 2012-04-13 ENCOUNTER — Ambulatory Visit: Payer: Self-pay | Admitting: Neurosurgery

## 2012-07-11 ENCOUNTER — Emergency Department (HOSPITAL_COMMUNITY): Payer: Medicare Other

## 2012-07-11 ENCOUNTER — Emergency Department (HOSPITAL_COMMUNITY)
Admission: EM | Admit: 2012-07-11 | Discharge: 2012-07-11 | Disposition: A | Discharge status: Home / Self Care | Payer: Medicare Other | Attending: Emergency Medicine | Admitting: Emergency Medicine

## 2012-07-11 ENCOUNTER — Encounter (HOSPITAL_COMMUNITY): Payer: Self-pay | Admitting: *Deleted

## 2012-07-11 DIAGNOSIS — R05 Cough: Secondary | ICD-10-CM | POA: Insufficient documentation

## 2012-07-11 DIAGNOSIS — R071 Chest pain on breathing: Secondary | ICD-10-CM | POA: Insufficient documentation

## 2012-07-11 DIAGNOSIS — R0789 Other chest pain: Secondary | ICD-10-CM

## 2012-07-11 DIAGNOSIS — E119 Type 2 diabetes mellitus without complications: Secondary | ICD-10-CM | POA: Insufficient documentation

## 2012-07-11 DIAGNOSIS — I10 Essential (primary) hypertension: Secondary | ICD-10-CM | POA: Insufficient documentation

## 2012-07-11 DIAGNOSIS — Z9861 Coronary angioplasty status: Secondary | ICD-10-CM | POA: Insufficient documentation

## 2012-07-11 DIAGNOSIS — Z79899 Other long term (current) drug therapy: Secondary | ICD-10-CM | POA: Insufficient documentation

## 2012-07-11 DIAGNOSIS — J209 Acute bronchitis, unspecified: Secondary | ICD-10-CM | POA: Insufficient documentation

## 2012-07-11 DIAGNOSIS — R109 Unspecified abdominal pain: Secondary | ICD-10-CM

## 2012-07-11 DIAGNOSIS — R059 Cough, unspecified: Secondary | ICD-10-CM | POA: Insufficient documentation

## 2012-07-11 DIAGNOSIS — I2510 Atherosclerotic heart disease of native coronary artery without angina pectoris: Secondary | ICD-10-CM | POA: Insufficient documentation

## 2012-07-11 DIAGNOSIS — Z7982 Long term (current) use of aspirin: Secondary | ICD-10-CM | POA: Insufficient documentation

## 2012-07-11 HISTORY — DX: Essential (primary) hypertension: I10

## 2012-07-11 HISTORY — DX: Atherosclerotic heart disease of native coronary artery without angina pectoris: I25.10

## 2012-07-11 HISTORY — DX: Type 2 diabetes mellitus without complications: E11.9

## 2012-07-11 HISTORY — DX: Presence of coronary angioplasty implant and graft: Z95.5

## 2012-07-11 LAB — URINALYSIS, ROUTINE W REFLEX MICROSCOPIC
Bilirubin Urine: NEGATIVE
Glucose, UA: NEGATIVE mg/dL
Hgb urine dipstick: NEGATIVE
Ketones, ur: 15 mg/dL — AB
Leukocytes, UA: NEGATIVE
Nitrite: NEGATIVE
Protein, ur: NEGATIVE mg/dL
Specific Gravity, Urine: 1.035 — ABNORMAL HIGH (ref 1.005–1.030)
Urobilinogen, UA: 0.2 mg/dL (ref 0.0–1.0)
pH: 5.5 (ref 5.0–8.0)

## 2012-07-11 LAB — CBC WITH DIFFERENTIAL/PLATELET
Basophils Absolute: 0 10*3/uL (ref 0.0–0.1)
Basophils Relative: 1 % (ref 0–1)
Eosinophils Absolute: 0.2 10*3/uL (ref 0.0–0.7)
Eosinophils Relative: 3 % (ref 0–5)
HCT: 43.6 % (ref 39.0–52.0)
Hemoglobin: 13.7 g/dL (ref 13.0–17.0)
Lymphocytes Relative: 26 % (ref 12–46)
Lymphs Abs: 1.6 10*3/uL (ref 0.7–4.0)
MCH: 27 pg (ref 26.0–34.0)
MCHC: 31.4 g/dL (ref 30.0–36.0)
MCV: 85.8 fL (ref 78.0–100.0)
Monocytes Absolute: 0.5 10*3/uL (ref 0.1–1.0)
Monocytes Relative: 7 % (ref 3–12)
Neutro Abs: 3.9 10*3/uL (ref 1.7–7.7)
Neutrophils Relative %: 63 % (ref 43–77)
Platelets: 151 10*3/uL (ref 150–400)
RBC: 5.08 MIL/uL (ref 4.22–5.81)
RDW: 14 % (ref 11.5–15.5)
WBC: 6.2 10*3/uL (ref 4.0–10.5)

## 2012-07-11 LAB — COMPREHENSIVE METABOLIC PANEL
ALT: 63 U/L — ABNORMAL HIGH (ref 0–53)
AST: 37 U/L (ref 0–37)
Albumin: 3.9 g/dL (ref 3.5–5.2)
Alkaline Phosphatase: 66 U/L (ref 39–117)
BUN: 26 mg/dL — ABNORMAL HIGH (ref 6–23)
CO2: 24 mEq/L (ref 19–32)
Calcium: 9.3 mg/dL (ref 8.4–10.5)
Chloride: 98 mEq/L (ref 96–112)
Creatinine, Ser: 1.13 mg/dL (ref 0.50–1.35)
GFR calc Af Amer: 79 mL/min — ABNORMAL LOW (ref >90)
GFR calc non Af Amer: 68 mL/min — ABNORMAL LOW (ref >90)
Glucose, Bld: 226 mg/dL — ABNORMAL HIGH (ref 70–99)
Potassium: 4.1 mEq/L (ref 3.5–5.1)
Sodium: 135 mEq/L (ref 135–145)
Total Bilirubin: 0.4 mg/dL (ref 0.3–1.2)
Total Protein: 6.8 g/dL (ref 6.0–8.3)

## 2012-07-11 LAB — LIPASE, BLOOD: Lipase: 60 U/L — ABNORMAL HIGH (ref 11–59)

## 2012-07-11 MED ORDER — MORPHINE SULFATE 4 MG/ML IJ SOLN
4.0000 mg | Freq: Once | INTRAMUSCULAR | Status: AC
  Administered 2012-07-11: 4 mg via INTRAVENOUS
  Filled 2012-07-11: qty 1

## 2012-07-11 MED ORDER — HYDROCODONE-ACETAMINOPHEN 5-325 MG PO TABS: 2.0000 | ORAL_TABLET | Freq: Four times a day (QID) | ORAL | Status: DC | PRN

## 2012-07-11 MED ORDER — MOXIFLOXACIN HCL 400 MG PO TABS: 400.0000 mg | ORAL_TABLET | Freq: Every day | ORAL | Status: DC

## 2012-07-11 NOTE — ED Provider Notes (Signed)
History     CSN: [id number]  Arrival date & time 07/11/12  1354   First MD Initiated Contact with Patient 07/11/12 1412      Chief Complaint  Patient presents with  . Chest Pain    (Consider location/radiation/quality/duration/timing/severity/associated sxs/prior treatment) HPI Comments: Patient presents with cough for the past 10 days.  Now having pain in the right side of the abdomen and ribs.  He denies coughing up any phlegm.  No fevers or chills.  He was seen by his pcp last week and started on amoxicillin but is not improving.  No urinary complaints.  His brother was recently diagnosed with pancreatic cancer and is concerned this is happening to him.    Patient is a 62 y.o. male presenting with chest pain. The history is provided by the patient.  Chest Pain The chest pain began 2 days ago. Chest pain occurs constantly. The chest pain is worsening. The severity of the pain is moderate. The quality of the pain is described as sharp. The pain does not radiate. Chest pain is worsened by certain positions. Primary symptoms include cough. Pertinent negatives for primary symptoms include no fever, no shortness of breath, no nausea and no vomiting.     Past Medical History  Diagnosis Date  . Coronary artery disease   . Hypertension   . Diabetes mellitus without complication   . Stented coronary artery     Past Surgical History  Procedure Date  . Appendectomy   . Cholecystectomy   . Joint replacement     No family history on file.  History  Substance Use Topics  . Smoking status: Not on file  . Smokeless tobacco: Not on file  . Alcohol Use:       Review of Systems  Constitutional: Negative for fever.  Respiratory: Positive for cough. Negative for shortness of breath.   Cardiovascular: Positive for chest pain.  Gastrointestinal: Negative for nausea and vomiting.  All other systems reviewed and are negative.    Allergies  Sitagliptin phosphate; Sulfamethoxazole  w-trimethoprim; and Tetracyclines & related  Home Medications   Current Outpatient Rx  Name  Route  Sig  Dispense  Refill  . ALBUTEROL SULFATE HFA 108 (90 BASE) MCG/ACT IN AERS   Inhalation   Inhale 2 puffs into the lungs every 6 (six) hours as needed. For difficulty breathing         . AMLODIPINE BESYLATE 5 MG PO TABS   Oral   Take 5 mg by mouth daily.         . AMOXICILLIN 875 MG PO TABS   Oral   Take 875 mg by mouth 2 (two) times daily.         . ASPIRIN EC 81 MG PO TBEC   Oral   Take 81 mg by mouth daily.         . ATENOLOL 100 MG PO TABS   Oral   Take 100 mg by mouth daily.         [name] Kitchen BENAZEPRIL HCL 40 MG PO TABS   Oral   Take 40 mg by mouth daily.         [name] Kitchen CETIRIZINE HCL 10 MG PO TABS   Oral   Take 10 mg by mouth daily.         [name] Kitchen VITAMIN D3 1000 UNITS PO TABS   Oral   Take 1,000 Units by mouth daily.         [name] Kitchen FLUTICASONE PROPIONATE 50  MCG/ACT NA SUSP   Nasal   Place 2 sprays into the nose daily as needed. For sinuses         . GLIPIZIDE 5 MG PO TABS   Oral   Take 5 mg by mouth 2 (two) times daily before a meal.         . IBUPROFEN 800 MG PO TABS   Oral   Take 800 mg by mouth 2 (two) times daily.         [name] Kitchen METFORMIN HCL 1000 MG PO TABS   Oral   Take 1,000 mg by mouth 2 (two) times daily with a meal.         . MENS MULTIVITAMIN PLUS PO   Oral   Take 1 tablet by mouth daily.         [name] Kitchen OMEPRAZOLE 20 MG PO CPDR   Oral   Take 20 mg by mouth daily.         [name] Kitchen PRAVASTATIN SODIUM 40 MG PO TABS   Oral   Take 40 mg by mouth daily.         [name] Kitchen SITAGLIPTIN PHOSPHATE 100 MG PO TABS   Oral   Take 100 mg by mouth daily.         . TESTOSTERONE CYPIONATE 100 MG/ML IM OIL   Intramuscular   Inject 200 mg into the muscle every 14 (fourteen) days. For IM use only           BP 150/96  Pulse 66  Temp 97.3 F (36.3 C) (Oral)  Resp 18  SpO2 100%  Physical Exam  Nursing note and vitals reviewed. Constitutional: He is  oriented to person, place, and time. He appears well-developed and well-nourished. No distress.  HENT:  Head: Normocephalic and atraumatic.  Mouth/Throat: Oropharynx is clear and moist.  Neck: Normal range of motion. Neck supple.  Cardiovascular: Normal rate and regular rhythm.  Exam reveals no friction rub.   No murmur heard. Pulmonary/Chest: Effort normal and breath sounds normal. No respiratory distress. He has no wheezes.  Abdominal: Soft. Bowel sounds are normal. He exhibits no distension. There is tenderness.       There is ttp over the right lower lateral ribs and right upper quadrant and flank.  There is no rebound or guarding.  Bowel sounds are present.  Musculoskeletal: Normal range of motion. He exhibits no edema.  Neurological: He is alert and oriented to person, place, and time.  Skin: Skin is warm and dry. He is not diaphoretic.    ED Course  Procedures (including critical care time)   Labs Reviewed  CBC WITH DIFFERENTIAL  COMPREHENSIVE METABOLIC PANEL  LIPASE, BLOOD  URINALYSIS, ROUTINE W REFLEX MICROSCOPIC   No results found.   No diagnosis found.    MDM  The patient presents with pain in the right lower ribs and right upper quadrant.  He has been coughing a lot lately.  The chest xray does not reveal a pneumonia or ptx and the ct does not show a stone.  He is feeling better.  I will prescribe him a pain medication and will also change his antibiotic as he continues to have productive cough.  To return prn.        Geoffery Lyons, MD 07/11/12 [phone]

## 2012-07-11 NOTE — ED Notes (Signed)
Pt discharged to home with family. NAD.  

## 2012-07-11 NOTE — ED Notes (Signed)
Pt reports he has been coughing for 10 days; denies fever; reports severe pain in ribs with movement.

## 2012-07-20 ENCOUNTER — Other Ambulatory Visit: Payer: Self-pay | Admitting: Gastroenterology

## 2012-07-20 DIAGNOSIS — R109 Unspecified abdominal pain: Secondary | ICD-10-CM

## 2012-07-27 ENCOUNTER — Ambulatory Visit
Admission: RE | Admit: 2012-07-27 | Discharge: 2012-07-27 | Disposition: A | Discharge status: Home / Self Care | Payer: Medicare Other | Source: Ambulatory Visit | Attending: Gastroenterology | Admitting: Gastroenterology

## 2012-07-27 DIAGNOSIS — R109 Unspecified abdominal pain: Secondary | ICD-10-CM

## 2012-08-24 LAB — HM COLONOSCOPY

## 2013-11-08 ENCOUNTER — Ambulatory Visit: Payer: Self-pay | Admitting: Anesthesiology

## 2013-11-10 ENCOUNTER — Ambulatory Visit: Payer: Self-pay | Admitting: Otolaryngology

## 2014-05-16 ENCOUNTER — Ambulatory Visit (HOSPITAL_BASED_OUTPATIENT_CLINIC_OR_DEPARTMENT_OTHER): Payer: Medicare HMO | Attending: Internal Medicine | Admitting: Radiology

## 2014-05-16 VITALS — Ht 70.0 in | Wt 195.0 lb

## 2014-05-16 DIAGNOSIS — Z9989 Dependence on other enabling machines and devices: Secondary | ICD-10-CM

## 2014-05-16 DIAGNOSIS — G4733 Obstructive sleep apnea (adult) (pediatric): Secondary | ICD-10-CM | POA: Diagnosis not present

## 2014-05-16 DIAGNOSIS — G4730 Sleep apnea, unspecified: Secondary | ICD-10-CM | POA: Diagnosis present

## 2014-05-20 DIAGNOSIS — G4733 Obstructive sleep apnea (adult) (pediatric): Secondary | ICD-10-CM

## 2014-05-20 NOTE — Sleep Study (Signed)
   NAME: Andrew Cobb DATE OF BIRTH:  26-Dec-1950 MEDICAL RECORD NUMBER [id number]  LOCATION: [facility]  PHYSICIAN: Yocheved Depner D  DATE OF STUDY: 05/16/2014  SLEEP STUDY TYPE: Nocturnal Polysomnogram               REFERRING PHYSICIAN: Golden Pop, MD  INDICATION FOR STUDY: hypersomnia with sleep apnea  EPWORTH SLEEPINESS SCORE:  1/24 HEIGHT: 5\' 10"  (177.8 cm)  WEIGHT: 195 lb (88.451 kg)    Body mass index is 27.98 kg/(m^2).  NECK SIZE: 16.5 in.  MEDICATIONS: charted for review  SLEEP ARCHITECTURE: split study protocol. During the diagnostic phase, total sleep time 120 minutes with sleep efficiency 88.6%. Stage I was 5%, stage II 78.3%, stage III absent, REM 16.7% of total sleep time. Sleep latency 8.5 minutes, REM latency 103.5 minutes, awake after sleep onset 4 minutes, arousal index 15, bedtime medication: None  RESPIRATORY DATA: apnea hypopnea index (AHI) 20.5 per hour. 41 total events scored including 14 obstructive apneas and 27 hypopneas. Non-positional events. REM AHI 18 per hour. CPAP titration to 7 CWP, AHI 0.3 per hour. He wore a medium nasal pillows mask.  OXYGEN DATA: before CPAP, moderate to loud snoring with oxygen desaturation to a nadir of 89% on room air. With CPAP control, snoring was prevented and mean oxygen saturation was 93.4%.  CARDIAC DATA: sinus rhythm with PACs  MOVEMENT/PARASOMNIA: there were no limb movements during the diagnostic phase. During the titration phase there were 208 limb jerks counted of which only 2 were associated with arousal or awakening for a periodic limb movement with arousal index of 0.4 per hour. No bathroom trips.  IMPRESSION/ RECOMMENDATION:   1) Moderate obstructive sleep apnea/hypopnea syndrome, AHI 20.5 per hour with non-positional events. REM AHI 18 per hour. Moderate to loud snoring with oxygen desaturation to a nadir of 89% on room air. 2)  Successful CPAP titration to 7 CWP, AHI 0.3 per hour. He  wore a medium Respironics Nuance Pro nasal pillows mask with heated humidifier. Snoring was prevented and mean oxygen saturation was 93.4%.  [place], American Board of Sleep Medicine  ELECTRONICALLY SIGNED ON:  05/20/2014, 1:57 PM No Name PH: [phone]   FX: [phone] [place]

## 2015-04-16 ENCOUNTER — Ambulatory Visit (INDEPENDENT_AMBULATORY_CARE_PROVIDER_SITE_OTHER): Payer: PPO | Admitting: Family Medicine

## 2015-04-16 ENCOUNTER — Encounter: Payer: Self-pay | Admitting: Family Medicine

## 2015-04-16 ENCOUNTER — Telehealth: Payer: Self-pay | Admitting: Family Medicine

## 2015-04-16 VITALS — BP 154/90 | HR 47 | Temp 97.4°F | Resp 16 | Ht 70.0 in | Wt 213.6 lb

## 2015-04-16 DIAGNOSIS — S81802A Unspecified open wound, left lower leg, initial encounter: Secondary | ICD-10-CM | POA: Diagnosis not present

## 2015-04-16 MED ORDER — CLINDAMYCIN HCL 300 MG PO CAPS: 300.0000 mg | ORAL_CAPSULE | Freq: Three times a day (TID) | ORAL | Status: DC

## 2015-04-16 NOTE — Telephone Encounter (Signed)
Pt  Called states he have appt. Thursday  Oct.13 th at 8:45 they need his last office visit and  Physical notes fax to  [phone]. [facility] wound Clinic # is  [phone].

## 2015-04-16 NOTE — Progress Notes (Signed)
Date:  04/16/2015   Name:  Andrew Cobb   DOB:  June 19, 1951   MRN:  [id number]  PCP:  Dicky Doe, MD    Chief Complaint: Open Wound   History of Present Illness:  This is a 64 y.o. male with nonhealing ulcer R lower leg for past two weeks, feels it is getting worse with more exudate. 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 Hx DM on multiple meds before gastric bypass surgery 2014. Treating daily with peroxide and triple antibiotic ointment.  Review of Systems:  Review of Systems  Constitutional: Negative for fever, chills, appetite change and fatigue.  Cardiovascular: Negative for leg swelling.    Patient Active Problem List   Diagnosis Date Noted  . SINUSITIS, CHRONIC 08/13/2009  . DERMATITIS DUE DRUGS&MEDICINES TAKEN INTERNALLY 08/13/2009  . COUGH 05/21/2009  . METHICILLIN SUSCEPTIBLE STAPH INF CCE & UNS SITE 04/02/2009  . METHICILLIN RESISTANT STAPHYLOCOCCUS AUREUS INFECTION 04/02/2009  . DIABETES MELLITUS, TYPE II 04/02/2009  . HYPERLIPIDEMIA 04/02/2009  . HYPERTENSION 04/02/2009  . CAD 04/02/2009  . ASTHMA 04/02/2009  . HYPERTROPHY PROSTATE W/UR OBST & OTH LUTS 04/02/2009  . BOILS, RECURRENT 04/02/2009  . 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 DEGENERATIVE JOINT DISEASE, KNEES, 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  . SLEEP APNEA 04/02/2009    Prior to Admission medications   Medication Sig Start Date End Date Taking? Authorizing Provider  albuterol (PROVENTIL HFA;VENTOLIN HFA) 108 (90 BASE) MCG/ACT inhaler Inhale 2 puffs into the lungs every 6 (six) hours as needed. For difficulty breathing   Yes Historical Provider, MD  cetirizine (ZYRTEC) 10 MG tablet Take 10 mg by mouth daily.   Yes Historical Provider, MD  cholecalciferol (VITAMIN D) 1000 UNITS tablet Take 1,000 Units by mouth daily.   Yes Historical Provider, MD  fluticasone (FLONASE) 50 MCG/ACT nasal spray Place 2 sprays into the nose daily as needed. For sinuses   Yes Historical Provider, MD   magnesium 30 MG tablet Take 250 mg by mouth daily.   Yes Historical Provider, MD  Multiple Vitamins-Minerals (MENS MULTIVITAMIN PLUS PO) Take 1 tablet by mouth daily.   Yes Historical Provider, MD  valACYclovir (VALTREX) 500 MG tablet Take 500 mg by mouth 2 (two) times daily.   Yes Historical Provider, MD  clindamycin (CLEOCIN) 300 MG capsule Take 1 capsule (300 mg total) by mouth 3 (three) times daily. 04/16/15   Adline Potter, MD    Allergies  Allergen Reactions  . Sitagliptin Phosphate     REACTION: sinusitis  . Sulfamethoxazole-Trimethoprim     REACTION: Drug rash  . Tetracyclines & Related     Past Surgical History  Procedure Laterality Date  . Appendectomy    . Cholecystectomy    . Joint replacement      Social History  Substance Use Topics  . Smoking status: Former Research scientist (life sciences)  . Smokeless tobacco: Not on file  . Alcohol Use: No    No family history on file.  Medication list has been reviewed and updated.  Physical Examination: BP 154/90 mmHg  Pulse 47  Temp(Src) 97.4 F (36.3 C) (Oral)  Resp 16  Ht 5\' 10"  (1.778 m)  Wt 213 lb 9.6 oz (96.888 kg)  BMI 30.65 kg/m2  Physical Exam  Constitutional: He appears well-developed and well-nourished.  Musculoskeletal: He exhibits no edema.  Neurological: He is alert.  Skin:  1x1 cm ulcer with shallow base L lower shin, mild exudate, mild surrounding erythema    Assessment and Plan:  1. Non-healing wound  of lower extremity, left, initial encounter Given hx MRSA will cover with clindamycin (allergic tetracycline and Bactrim) pending wound cx, advised daily yogurt to help prevent C. Diff, d/c peroxide and antibiotic oint, keep covered with bandaid, check daily, call if sxs worsen/persist - Wound culture - clindamycin (CLEOCIN) 300 MG capsule; Take 1 capsule (300 mg total) by mouth 3 (three) times daily.  Dispense: 30 capsule; Refill: 0  Return if symptoms worsen or fail to improve.  Satira Anis. Erasto Sleight, [facility]  04/16/2015

## 2015-04-18 ENCOUNTER — Telehealth: Payer: Self-pay | Admitting: Family Medicine

## 2015-04-18 LAB — WOUND CULTURE

## 2015-04-18 NOTE — Telephone Encounter (Signed)
Pts wife informed.

## 2015-04-18 NOTE — Telephone Encounter (Signed)
Pt  Wife called for results pt call back # is  [phone]

## 2015-04-19 ENCOUNTER — Ambulatory Visit: Payer: Medicare HMO | Admitting: Surgery

## 2015-04-24 ENCOUNTER — Telehealth: Payer: Self-pay | Admitting: Family Medicine

## 2015-04-24 ENCOUNTER — Ambulatory Visit (INDEPENDENT_AMBULATORY_CARE_PROVIDER_SITE_OTHER): Payer: PPO | Admitting: Family Medicine

## 2015-04-24 ENCOUNTER — Other Ambulatory Visit: Payer: Self-pay | Admitting: Family Medicine

## 2015-04-24 ENCOUNTER — Encounter: Payer: Self-pay | Admitting: Family Medicine

## 2015-04-24 DIAGNOSIS — I10 Essential (primary) hypertension: Secondary | ICD-10-CM | POA: Diagnosis not present

## 2015-04-24 DIAGNOSIS — E1165 Type 2 diabetes mellitus with hyperglycemia: Secondary | ICD-10-CM | POA: Insufficient documentation

## 2015-04-24 DIAGNOSIS — Z0000 Encounter for general adult medical examination without abnormal findings: Secondary | ICD-10-CM

## 2015-04-24 DIAGNOSIS — Z23 Encounter for immunization: Secondary | ICD-10-CM | POA: Diagnosis not present

## 2015-04-24 DIAGNOSIS — E119 Type 2 diabetes mellitus without complications: Secondary | ICD-10-CM

## 2015-04-24 DIAGNOSIS — E1169 Type 2 diabetes mellitus with other specified complication: Secondary | ICD-10-CM

## 2015-04-24 DIAGNOSIS — E1159 Type 2 diabetes mellitus with other circulatory complications: Secondary | ICD-10-CM | POA: Insufficient documentation

## 2015-04-24 LAB — POCT GLYCOSYLATED HEMOGLOBIN (HGB A1C)

## 2015-04-24 MED ORDER — METFORMIN HCL 500 MG PO TABS: 500.0000 mg | ORAL_TABLET | Freq: Two times a day (BID) | ORAL | Status: DC

## 2015-04-24 MED ORDER — ONETOUCH ULTRASOFT LANCETS MISC: 1.0000 | Freq: Two times a day (BID) | Status: DC

## 2015-04-24 MED ORDER — GLUCOSE BLOOD VI STRP: 1.0000 | ORAL_STRIP | Freq: Two times a day (BID) | Status: DC

## 2015-04-24 MED ORDER — FLUTICASONE PROPIONATE 50 MCG/ACT NA SUSP: 2.0000 | Freq: Every day | NASAL | Status: DC | PRN

## 2015-04-24 MED ORDER — LISINOPRIL 10 MG PO TABS: 10.0000 mg | ORAL_TABLET | Freq: Every day | ORAL | Status: DC

## 2015-04-24 MED ORDER — ONETOUCH ULTRA SYSTEM W/DEVICE KIT: 1.0000 | PACK | Freq: Once | Status: DC

## 2015-04-24 NOTE — Addendum Note (Signed)
Addended by: Devona Konig on: 04/24/2015 03:29 PM   Modules accepted: Orders

## 2015-04-24 NOTE — Telephone Encounter (Signed)
Pt's insurance will only pay for One Touch or Precision meter and strips.  Will Dr. Luan Pulling call one of those in to [facility] instead?  His call back number is [phone]

## 2015-04-24 NOTE — Progress Notes (Signed)
Name: Andrew Cobb   MRN: [id number]    DOB: 06/17/1951   Date:04/24/2015       Progress Note  Subjective  Chief Complaint  Chief Complaint  Patient presents with  . Annual Exam    HPI Here for complete physical exam.  Hx of HBP, DM, elevated lipids and sleep apnea and obesity.  Had gastric sleeve in 10/05/12.  He has been gaining some weight recently.   No real medical problems.  Has had a slow healing wound on Leg (L).  Healing with clindamycin).  No problem-specific assessment & plan notes found for this encounter.   Past Medical History  Diagnosis Date  . Coronary artery disease   . Hypertension   . Diabetes mellitus without complication ([place])   . Stented coronary artery     Past Surgical History  Procedure Laterality Date  . Appendectomy      1996  . Cholecystectomy      2006  . Joint replacement    . Gastric bypass      10/05/2012  . Angioplasty      Family History  Problem Relation Age of Onset  . Cancer Mother     pancreatic  . Diabetes Mother   . Stroke Mother   . Heart disease Mother   . Heart disease Father   . Stroke Father   . Diabetes Father   . Cancer Sister   . Cancer Brother     lung  . Cancer Brother     Social History   Social History  . Marital Status: Married    Spouse Name: N/A  . Number of Children: N/A  . Years of Education: N/A   Occupational History  . Not on file.   Social History Main Topics  . Smoking status: Former Smoker -- 1.00 packs/day for 10 years    Types: Cigarettes  . Smokeless tobacco: Never Used     Comment: quit 1986  . Alcohol Use: No  . Drug Use: No  . Sexual Activity: Not on file   Other Topics Concern  . Not on file   Social History Narrative     Current outpatient prescriptions:  .  albuterol (PROVENTIL HFA;VENTOLIN HFA) 108 (90 BASE) MCG/ACT inhaler, Inhale 2 puffs into the lungs every 6 (six) hours as needed. For difficulty breathing, Disp: , Rfl:  .  cetirizine (ZYRTEC) 10 MG tablet,  Take 10 mg by mouth daily., Disp: , Rfl:  .  cholecalciferol (VITAMIN D) 1000 UNITS tablet, Take 1,000 Units by mouth daily., Disp: , Rfl:  .  clindamycin (CLEOCIN) 300 MG capsule, Take 1 capsule (300 mg total) by mouth 3 (three) times daily., Disp: 30 capsule, Rfl: 0 .  fluticasone (FLONASE) 50 MCG/ACT nasal spray, Place 2 sprays into both nostrils daily as needed. For sinuses, Disp: 48 g, Rfl: 3 .  magnesium 30 MG tablet, Take 250 mg by mouth daily., Disp: , Rfl:  .  Multiple Vitamins-Minerals (MENS MULTIVITAMIN PLUS PO), Take 1 tablet by mouth daily., Disp: , Rfl:  .  valACYclovir (VALTREX) 500 MG tablet, Take 500 mg by mouth 2 (two) times daily., Disp: , Rfl:  .  lisinopril (PRINIVIL,ZESTRIL) 10 MG tablet, Take 1 tablet (10 mg total) by mouth daily., Disp: 90 tablet, Rfl: 3 .  metFORMIN (GLUCOPHAGE) 500 MG tablet, Take 1 tablet (500 mg total) by mouth 2 (two) times daily with a meal., Disp: 180 tablet, Rfl: 3  Allergies  Allergen Reactions  . Sitagliptin Phosphate  REACTION: sinusitis  . Sulfamethoxazole-Trimethoprim     REACTION: Drug rash  . Tetracyclines & Related      Review of Systems  Constitutional: Negative for fever, chills, weight loss and malaise/fatigue.  HENT: Positive for congestion. Negative for hearing loss.   Eyes: Negative for blurred vision and double vision.  Respiratory: Negative for cough, sputum production, shortness of breath and wheezing.   Cardiovascular: Negative for chest pain, palpitations, orthopnea and leg swelling.  Gastrointestinal: Negative for heartburn, abdominal pain and blood in stool.  Genitourinary: Negative for dysuria, urgency and frequency.  Musculoskeletal: Positive for joint pain (plans L. knee replacement in early Jan. 2017. ). Negative for myalgias.  Skin: Negative for rash.  Neurological: Negative for dizziness, tremors, weakness and headaches.      Objective  Filed Vitals:   04/24/15 1342 04/24/15 1520  BP: 158/88 160/80   Pulse: 57   Temp: 97.8 F (36.6 C)   TempSrc: Oral   Resp: 16   Height: 5' 10.5" (1.791 m)   Weight: 215 lb (97.523 kg)     Physical Exam  Constitutional: He is oriented to person, place, and time and well-developed, well-nourished, and in no distress. No distress.  HENT:  Head: Normocephalic and atraumatic.  Right Ear: External ear normal.  Left Ear: External ear normal.  Nose: Nose normal.  Mouth/Throat: No oropharyngeal exudate.  Eyes: Conjunctivae and EOM are normal. Pupils are equal, round, and reactive to light. No scleral icterus.  Neck: Normal range of motion. Neck supple. Carotid bruit is not present. No thyromegaly present.  Cardiovascular: Normal rate, regular rhythm, normal heart sounds and intact distal pulses.  Exam reveals no gallop and no friction rub.   No murmur heard. Pulmonary/Chest: Effort normal and breath sounds normal. No respiratory distress. He has no wheezes. He has no rales.  Abdominal: Soft. Bowel sounds are normal. He exhibits no distension and no mass. There is no tenderness.  Genitourinary: Rectum normal, prostate normal and penis normal. No discharge found.  Musculoskeletal: He exhibits no edema.  L. Knee with painful ROM  Lymphadenopathy:    He has no cervical adenopathy.  Neurological: He is alert and oriented to person, place, and time.  Skin: Skin is warm and dry.       Recent Results (from the past 2160 hour(s))  Wound culture     Status: None   Collection Time: 04/16/15 12:00 AM  Result Value Ref Range   Aerobic Bacterial Culture Final report    Result 1 Mixed skin flora     Comment: Scant growth  POCT HgB A1C     Status: Abnormal   Collection Time: 04/24/15  2:12 PM  Result Value Ref Range   Hemoglobin A1C 7.5%      Assessment & Plan  Problem List Items Addressed This Visit      Cardiovascular and Mediastinum   Essential hypertension   Relevant Medications   lisinopril (PRINIVIL,ZESTRIL) 10 MG tablet     Endocrine    Type 2 diabetes mellitus without complication, without long-term current use of insulin (HCC)   Relevant Medications   metFORMIN (GLUCOPHAGE) 500 MG tablet   lisinopril (PRINIVIL,ZESTRIL) 10 MG tablet   Other Relevant Orders   POCT HgB A1C (Completed)     Other   Annual physical exam - Primary   Need for influenza vaccination   Relevant Orders   Flu Vaccine QUAD 36+ mos PF IM (Fluarix & Fluzone Quad PF) (Completed)      Meds ordered  this encounter  Medications  . fluticasone (FLONASE) 50 MCG/ACT nasal spray    Sig: Place 2 sprays into both nostrils daily as needed. For sinuses    Dispense:  48 g    Refill:  3  . metFORMIN (GLUCOPHAGE) 500 MG tablet    Sig: Take 1 tablet (500 mg total) by mouth 2 (two) times daily with a meal.    Dispense:  180 tablet    Refill:  3  . lisinopril (PRINIVIL,ZESTRIL) 10 MG tablet    Sig: Take 1 tablet (10 mg total) by mouth daily.    Dispense:  90 tablet    Refill:  3   1. Annual physical exam   2. Type 2 diabetes mellitus without complication, without long-term current use of insulin (HCC)  - POCT HgB A1C - metFORMIN (GLUCOPHAGE) 500 MG tablet; Take 1 tablet (500 mg total) by mouth 2 (two) times daily with a meal.  Dispense: 180 tablet; Refill: 3  3. Essential hypertension  - lisinopril (PRINIVIL,ZESTRIL) 10 MG tablet; Take 1 tablet (10 mg total) by mouth daily.  Dispense: 90 tablet; Refill: 3  4. Need for influenza vaccination  - Flu Vaccine QUAD 36+ mos PF IM (Fluarix & Fluzone Quad PF)

## 2015-04-24 NOTE — Patient Instructions (Signed)
Check blood sugars 1-2 times a day.

## 2015-04-24 NOTE — Telephone Encounter (Signed)
One touch ultra meter, strips and lancets sent to pharmacy. Patient notified.

## 2015-04-26 LAB — CBC WITH DIFFERENTIAL/PLATELET
BASOS ABS: 0 10*3/uL (ref 0.0–0.2)
Basos: 1 %
EOS (ABSOLUTE): 0.1 10*3/uL (ref 0.0–0.4)
Eos: 3 %
HEMOGLOBIN: 15.5 g/dL (ref 12.6–17.7)
Hematocrit: 44.2 % (ref 37.5–51.0)
Immature Grans (Abs): 0 10*3/uL (ref 0.0–0.1)
Immature Granulocytes: 0 %
LYMPHS ABS: 1.3 10*3/uL (ref 0.7–3.1)
Lymphs: 30 %
MCH: 31.8 pg (ref 26.6–33.0)
MCHC: 35.1 g/dL (ref 31.5–35.7)
MCV: 91 fL (ref 79–97)
MONOCYTES: 11 %
MONOS ABS: 0.5 10*3/uL (ref 0.1–0.9)
NEUTROS ABS: 2.4 10*3/uL (ref 1.4–7.0)
Neutrophils: 55 %
Platelets: 151 10*3/uL (ref 150–379)
RBC: 4.87 x10E6/uL (ref 4.14–5.80)
RDW: 12.5 % (ref 12.3–15.4)
WBC: 4.3 10*3/uL (ref 3.4–10.8)

## 2015-04-27 ENCOUNTER — Telehealth: Payer: Self-pay | Admitting: Family Medicine

## 2015-04-27 LAB — COMPREHENSIVE METABOLIC PANEL
ALT: 55 IU/L — AB (ref 0–44)
AST: 35 IU/L (ref 0–40)
Albumin/Globulin Ratio: 2.3 (ref 1.1–2.5)
Albumin: 4.6 g/dL (ref 3.6–4.8)
Alkaline Phosphatase: 75 IU/L (ref 39–117)
BUN/Creatinine Ratio: 17 (ref 10–22)
BUN: 18 mg/dL (ref 8–27)
Bilirubin Total: 1 mg/dL (ref 0.0–1.2)
CALCIUM: 9.3 mg/dL (ref 8.6–10.2)
CHLORIDE: 100 mmol/L (ref 97–106)
CO2: 22 mmol/L (ref 18–29)
Creatinine, Ser: 1.06 mg/dL (ref 0.76–1.27)
GFR, EST AFRICAN AMERICAN: 85 mL/min/{1.73_m2} (ref >59)
GFR, EST NON AFRICAN AMERICAN: 74 mL/min/{1.73_m2} (ref >59)
GLUCOSE: 119 mg/dL — AB (ref 65–99)
Globulin, Total: 2 g/dL (ref 1.5–4.5)
Potassium: 4.3 mmol/L (ref 3.5–5.2)
Sodium: 139 mmol/L (ref 136–144)
TOTAL PROTEIN: 6.6 g/dL (ref 6.0–8.5)

## 2015-04-27 LAB — LIPID PANEL
CHOL/HDL RATIO: 4.8 ratio (ref 0.0–5.0)
Cholesterol, Total: 206 mg/dL — ABNORMAL HIGH (ref 100–199)
HDL: 43 mg/dL (ref >39)
LDL Calculated: 129 mg/dL — ABNORMAL HIGH (ref 0–99)
TRIGLYCERIDES: 171 mg/dL — AB (ref 0–149)
VLDL CHOLESTEROL CAL: 34 mg/dL (ref 5–40)

## 2015-04-27 MED ORDER — FLUTICASONE PROPIONATE 50 MCG/ACT NA SUSP: 2.0000 | Freq: Every day | NASAL | Status: DC | PRN

## 2015-04-27 NOTE — Telephone Encounter (Signed)
Pt's wife said the prescription for fluticasone was misplaced.  She would like it to be faxed to [facility] [phone] for 90 day supply .  Her call back number is [phone].

## 2015-05-28 ENCOUNTER — Ambulatory Visit (INDEPENDENT_AMBULATORY_CARE_PROVIDER_SITE_OTHER): Payer: PPO | Admitting: Family Medicine

## 2015-05-28 ENCOUNTER — Encounter: Payer: Self-pay | Admitting: Family Medicine

## 2015-05-28 ENCOUNTER — Ambulatory Visit: Payer: PPO | Admitting: Family Medicine

## 2015-05-28 VITALS — BP 136/76 | HR 52 | Temp 97.4°F | Resp 16 | Ht 70.5 in | Wt 207.0 lb

## 2015-05-28 DIAGNOSIS — I10 Essential (primary) hypertension: Secondary | ICD-10-CM

## 2015-05-28 DIAGNOSIS — R001 Bradycardia, unspecified: Secondary | ICD-10-CM

## 2015-05-28 DIAGNOSIS — E119 Type 2 diabetes mellitus without complications: Secondary | ICD-10-CM

## 2015-05-28 NOTE — Progress Notes (Signed)
. Name: Andrew Cobb   MRN: [id number]    DOB: 02-19-1951   Date:05/28/2015       Progress Note  Subjective  Chief Complaint  Chief Complaint  Patient presents with  . Hypertension  . Diabetes    HPI Here for f/u of T2DM and HBP.  BSs range from 85-140 with 30 day average of 118.  He has lost weight and leaving more sugars and fats out of diet.   BPs at home run in 120/75 range.  No problem-specific assessment & plan notes found for this encounter.   Past Medical History  Diagnosis Date  . Coronary artery disease   . Hypertension   . Diabetes mellitus without complication ([place])   . Stented coronary artery     Past Surgical History  Procedure Laterality Date  . Appendectomy      1996  . Cholecystectomy      2006  . Joint replacement    . Gastric bypass      10/05/2012  . Angioplasty      Family History  Problem Relation Age of Onset  . Cancer Mother     pancreatic  . Diabetes Mother   . Stroke Mother   . Heart disease Mother   . Heart disease Father   . Stroke Father   . Diabetes Father   . Cancer Sister   . Cancer Brother     lung  . Cancer Brother     Social History   Social History  . Marital Status: Married    Spouse Name: N/A  . Number of Children: N/A  . Years of Education: N/A   Occupational History  . Not on file.   Social History Main Topics  . Smoking status: Former Smoker -- 1.00 packs/day for 10 years    Types: Cigarettes  . Smokeless tobacco: Never Used     Comment: quit 1986  . Alcohol Use: No  . Drug Use: No  . Sexual Activity: Not on file   Other Topics Concern  . Not on file   Social History Narrative     Current outpatient prescriptions:  .  albuterol (PROVENTIL HFA;VENTOLIN HFA) 108 (90 BASE) MCG/ACT inhaler, Inhale 2 puffs into the lungs every 6 (six) hours as needed. For difficulty breathing, Disp: , Rfl:  .  Blood Glucose Monitoring Suppl (ONE TOUCH ULTRA SYSTEM KIT) W/DEVICE KIT, 1 kit by Does not apply  route once. Dx E11.9, Disp: 1 each, Rfl: 0 .  cetirizine (ZYRTEC) 10 MG tablet, Take 10 mg by mouth daily., Disp: , Rfl:  .  cholecalciferol (VITAMIN D) 1000 UNITS tablet, Take 1,000 Units by mouth daily., Disp: , Rfl:  .  fluticasone (FLONASE) 50 MCG/ACT nasal spray, Place 2 sprays into both nostrils daily as needed. For sinuses, Disp: 48 g, Rfl: 3 .  glucose blood test strip, 1 each by Other route 2 (two) times daily. DX E11.9, Disp: 100 each, Rfl: 12 .  Lancets (ONETOUCH ULTRASOFT) lancets, 1 each by Other route 2 (two) times daily. Dx E11.9, Disp: 100 each, Rfl: 12 .  lisinopril (PRINIVIL,ZESTRIL) 10 MG tablet, Take 1 tablet (10 mg total) by mouth daily., Disp: 90 tablet, Rfl: 3 .  magnesium 30 MG tablet, Take 250 mg by mouth daily., Disp: , Rfl:  .  metFORMIN (GLUCOPHAGE) 500 MG tablet, Take 1 tablet (500 mg total) by mouth 2 (two) times daily with a meal., Disp: 180 tablet, Rfl: 3 .  Multiple Vitamins-Minerals (MENS MULTIVITAMIN  PLUS PO), Take 1 tablet by mouth daily., Disp: , Rfl:  .  valACYclovir (VALTREX) 500 MG tablet, Take 500 mg by mouth 2 (two) times daily., Disp: , Rfl:   Allergies  Allergen Reactions  . Sitagliptin Phosphate     REACTION: sinusitis  . Sulfamethoxazole-Trimethoprim     REACTION: Drug rash  . Tetracyclines & Related      Review of Systems  Constitutional: Positive for weight loss (desired). Negative for fever and chills.  HENT: Negative for hearing loss.   Eyes: Negative for blurred vision and double vision.  Respiratory: Negative for cough, shortness of breath and wheezing.   Cardiovascular: Negative for chest pain, palpitations and leg swelling.  Gastrointestinal: Negative for heartburn, abdominal pain and blood in stool.  Genitourinary: Negative for dysuria, urgency and frequency.  Musculoskeletal: Positive for joint pain (L knee replacement planned for 07/2015.). Negative for myalgias.  Skin: Negative for rash.  Neurological: Negative for dizziness,  tremors, weakness and headaches.      Objective  Filed Vitals:   05/28/15 0804 05/28/15 0841  BP: 136/76   Pulse: 53 52  Temp: 97.4 F (36.3 C)   TempSrc: Oral   Resp: 16   Height: 5' 10.5" (1.791 m)   Weight: 207 lb (93.895 kg)     Physical Exam  Constitutional: He is oriented to person, place, and time and well-developed, well-nourished, and in no distress. No distress.  HENT:  Head: Normocephalic and atraumatic.  Eyes: Conjunctivae and EOM are normal. Pupils are equal, round, and reactive to light. No scleral icterus.  Neck: Neck supple. Carotid bruit is not present. No thyromegaly present.  Cardiovascular: Regular rhythm, normal heart sounds and intact distal pulses.  Bradycardia present.  Exam reveals no gallop and no friction rub.   No murmur heard. Pulmonary/Chest: No respiratory distress. He has no wheezes. He has no rales.  Abdominal: He exhibits no distension, no abdominal bruit and no mass. There is no tenderness.  Musculoskeletal: He exhibits no edema.  Lymphadenopathy:    He has no cervical adenopathy.  Neurological: He is alert and oriented to person, place, and time.  Vitals reviewed.      Recent Results (from the past 2160 hour(s))  Wound culture     Status: None   Collection Time: 04/16/15 12:00 AM  Result Value Ref Range   Aerobic Bacterial Culture Final report    Result 1 Mixed skin flora     Comment: Scant growth  POCT HgB A1C     Status: Abnormal   Collection Time: 04/24/15  2:12 PM  Result Value Ref Range   Hemoglobin A1C 7.5%   Comprehensive Metabolic Panel (CMET)     Status: Abnormal   Collection Time: 04/26/15  9:54 AM  Result Value Ref Range   Glucose 119 (H) 65 - 99 mg/dL   BUN 18 8 - 27 mg/dL   Creatinine, Ser 1.06 0.76 - 1.27 mg/dL   GFR calc non Af Amer 74 >59 mL/min/1.73   GFR calc Af Amer 85 >59 mL/min/1.73   BUN/Creatinine Ratio 17 10 - 22   Sodium 139 136 - 144 mmol/L    Comment:               **Please note reference  interval change**   Potassium 4.3 3.5 - 5.2 mmol/L    Comment:               **Please note reference interval change**   Chloride 100 97 - 106 mmol/L  Comment:               **Please note reference interval change**   CO2 22 18 - 29 mmol/L   Calcium 9.3 8.6 - 10.2 mg/dL   Total Protein 6.6 6.0 - 8.5 g/dL   Albumin 4.6 3.6 - 4.8 g/dL   Globulin, Total 2.0 1.5 - 4.5 g/dL   Albumin/Globulin Ratio 2.3 1.1 - 2.5   Bilirubin Total 1.0 0.0 - 1.2 mg/dL   Alkaline Phosphatase 75 39 - 117 IU/L   AST 35 0 - 40 IU/L   ALT 55 (H) 0 - 44 IU/L  Lipid Profile     Status: Abnormal   Collection Time: 04/26/15  9:54 AM  Result Value Ref Range   Cholesterol, Total 206 (H) 100 - 199 mg/dL   Triglycerides 171 (H) 0 - 149 mg/dL   HDL 43 >39 mg/dL    Comment: According to ATP-III Guidelines, HDL-C >59 mg/dL is considered a negative risk factor for CHD.    VLDL Cholesterol Cal 34 5 - 40 mg/dL   LDL Calculated 129 (H) 0 - 99 mg/dL   Chol/HDL Ratio 4.8 0.0 - 5.0 ratio units    Comment:                                   T. Chol/HDL Ratio                                             Men  Women                               1/2 Avg.Risk  3.4    3.3                                   Avg.Risk  5.0    4.4                                2X Avg.Risk  9.6    7.1                                3X Avg.Risk 23.4   11.0   CBC with Differential     Status: None   Collection Time: 04/26/15  9:54 AM  Result Value Ref Range   WBC 4.3 3.4 - 10.8 x10E3/uL   RBC 4.87 4.14 - 5.80 x10E6/uL   Hemoglobin 15.5 12.6 - 17.7 g/dL   Hematocrit 44.2 37.5 - 51.0 %   MCV 91 79 - 97 fL   MCH 31.8 26.6 - 33.0 pg   MCHC 35.1 31.5 - 35.7 g/dL   RDW 12.5 12.3 - 15.4 %   Platelets 151 150 - 379 x10E3/uL   Neutrophils 55 %   Lymphs 30 %   Monocytes 11 %   Eos 3 %   Basos 1 %   Neutrophils Absolute 2.4 1.4 - 7.0 x10E3/uL   Lymphocytes Absolute 1.3 0.7 - 3.1 x10E3/uL   Monocytes Absolute 0.5 0.1 - 0.9 x10E3/uL   EOS (ABSOLUTE)  0.1 0.0 -  0.4 x10E3/uL   Basophils Absolute 0.0 0.0 - 0.2 x10E3/uL   Immature Granulocytes 0 %   Immature Grans (Abs) 0.0 0.0 - 0.1 x10E3/uL     Assessment & Plan  Problem List Items Addressed This Visit      Cardiovascular and Mediastinum   Essential hypertension     Endocrine   Type 2 diabetes mellitus without complication, without long-term current use of insulin ([place]) - Primary     Other   Bradycardia   Relevant Orders   EKG 12-Lead   Ambulatory referral to Cardiology      No orders of the defined types were placed in this encounter.   1. Type 2 diabetes mellitus without complication, without long-term current use of insulin ([place]) -continue meds  2. Essential hypertension  -Continue meds 3. Bradycardia  - EKG 12-Lead - Ambulatory referral to Cardiology

## 2015-06-07 ENCOUNTER — Ambulatory Visit: Payer: Self-pay | Admitting: Orthopedic Surgery

## 2015-06-07 DIAGNOSIS — Z9889 Other specified postprocedural states: Secondary | ICD-10-CM | POA: Insufficient documentation

## 2015-06-07 NOTE — Progress Notes (Signed)
Preoperative surgical orders have been place into the Epic hospital system for Andrew Cobb on 06/07/2015, 1:10 PM  by Mickel Crow for surgery on 07-13-2015.  Preop Total Knee orders including Experal, IV Tylenol, and IV Decadron as long as there are no contraindications to the above medications. Arlee Muslim, PA-C

## 2015-06-08 DIAGNOSIS — Z01810 Encounter for preprocedural cardiovascular examination: Secondary | ICD-10-CM | POA: Insufficient documentation

## 2015-06-13 ENCOUNTER — Telehealth: Payer: Self-pay | Admitting: Family Medicine

## 2015-06-13 NOTE — Telephone Encounter (Signed)
Deanna from [facility] called about pt's visit on 04/24/15.  She had a question about CPT code 993.86.  Her call back number is [phone]

## 2015-06-20 ENCOUNTER — Telehealth: Payer: Self-pay | Admitting: Family Medicine

## 2015-06-20 NOTE — Telephone Encounter (Signed)
Pt called needing a referral to [facility]   [place] . If any question you can call wife back  [phone]),  (c) [phone]

## 2015-06-20 NOTE — Telephone Encounter (Signed)
[facility] is out of network with ins. Patient needs to establish with [facility] for future care. Patient's spouse will call back to let us know whether [place].

## 2015-06-21 ENCOUNTER — Other Ambulatory Visit: Payer: Self-pay | Admitting: Family Medicine

## 2015-06-21 DIAGNOSIS — I10 Essential (primary) hypertension: Secondary | ICD-10-CM

## 2015-06-21 DIAGNOSIS — I2510 Atherosclerotic heart disease of native coronary artery without angina pectoris: Secondary | ICD-10-CM

## 2015-07-05 ENCOUNTER — Encounter: Payer: Self-pay | Admitting: Family Medicine

## 2015-07-05 ENCOUNTER — Encounter (HOSPITAL_COMMUNITY)
Admission: RE | Admit: 2015-07-05 | Discharge: 2015-07-05 | Disposition: A | Discharge status: Home / Self Care | Payer: PPO | Source: Ambulatory Visit | Attending: Orthopedic Surgery | Admitting: Orthopedic Surgery

## 2015-07-05 ENCOUNTER — Ambulatory Visit (HOSPITAL_COMMUNITY)
Admission: RE | Admit: 2015-07-05 | Discharge: 2015-07-05 | Disposition: A | Discharge status: Home / Self Care | Payer: PPO | Source: Ambulatory Visit | Attending: Anesthesiology | Admitting: Anesthesiology

## 2015-07-05 ENCOUNTER — Ambulatory Visit (INDEPENDENT_AMBULATORY_CARE_PROVIDER_SITE_OTHER): Payer: PPO | Admitting: Family Medicine

## 2015-07-05 ENCOUNTER — Encounter (HOSPITAL_COMMUNITY): Payer: Self-pay

## 2015-07-05 VITALS — BP 102/68 | HR 63 | Temp 97.9°F | Resp 16 | Ht 70.5 in | Wt 208.0 lb

## 2015-07-05 DIAGNOSIS — I10 Essential (primary) hypertension: Secondary | ICD-10-CM | POA: Diagnosis not present

## 2015-07-05 DIAGNOSIS — Z01818 Encounter for other preprocedural examination: Secondary | ICD-10-CM | POA: Insufficient documentation

## 2015-07-05 DIAGNOSIS — R05 Cough: Secondary | ICD-10-CM

## 2015-07-05 DIAGNOSIS — E119 Type 2 diabetes mellitus without complications: Secondary | ICD-10-CM | POA: Insufficient documentation

## 2015-07-05 DIAGNOSIS — J0101 Acute recurrent maxillary sinusitis: Secondary | ICD-10-CM

## 2015-07-05 DIAGNOSIS — R059 Cough, unspecified: Secondary | ICD-10-CM

## 2015-07-05 HISTORY — DX: Unspecified osteoarthritis, unspecified site: M19.90

## 2015-07-05 HISTORY — DX: Methicillin resistant Staphylococcus aureus infection, unspecified site: A49.02

## 2015-07-05 HISTORY — DX: Sleep apnea, unspecified: G47.30

## 2015-07-05 HISTORY — DX: Gastro-esophageal reflux disease without esophagitis: K21.9

## 2015-07-05 LAB — URINALYSIS, ROUTINE W REFLEX MICROSCOPIC
Bilirubin Urine: NEGATIVE
GLUCOSE, UA: NEGATIVE mg/dL
Hgb urine dipstick: NEGATIVE
Ketones, ur: NEGATIVE mg/dL
LEUKOCYTES UA: NEGATIVE
Nitrite: NEGATIVE
PROTEIN: NEGATIVE mg/dL
Specific Gravity, Urine: 1.018 (ref 1.005–1.030)
pH: 5.5 (ref 5.0–8.0)

## 2015-07-05 LAB — TYPE AND SCREEN
ABO/RH(D): A POS
ANTIBODY SCREEN: NEGATIVE

## 2015-07-05 LAB — CBC
HEMATOCRIT: 46.4 % (ref 39.0–52.0)
HEMOGLOBIN: 15.3 g/dL (ref 13.0–17.0)
MCH: 31.8 pg (ref 26.0–34.0)
MCHC: 33 g/dL (ref 30.0–36.0)
MCV: 96.5 fL (ref 78.0–100.0)
Platelets: 155 10*3/uL (ref 150–400)
RBC: 4.81 MIL/uL (ref 4.22–5.81)
RDW: 13.2 % (ref 11.5–15.5)
WBC: 6.8 10*3/uL (ref 4.0–10.5)

## 2015-07-05 LAB — COMPREHENSIVE METABOLIC PANEL
ALK PHOS: 77 U/L (ref 38–126)
ALT: 45 U/L (ref 17–63)
AST: 31 U/L (ref 15–41)
Albumin: 4.4 g/dL (ref 3.5–5.0)
Anion gap: 9 (ref 5–15)
BILIRUBIN TOTAL: 0.8 mg/dL (ref 0.3–1.2)
BUN: 22 mg/dL — AB (ref 6–20)
CALCIUM: 9.3 mg/dL (ref 8.9–10.3)
CO2: 28 mmol/L (ref 22–32)
Chloride: 102 mmol/L (ref 101–111)
Creatinine, Ser: 1.19 mg/dL (ref 0.61–1.24)
GFR calc Af Amer: >60 mL/min (ref >60)
Glucose, Bld: 113 mg/dL — ABNORMAL HIGH (ref 65–99)
POTASSIUM: 4.2 mmol/L (ref 3.5–5.1)
Sodium: 139 mmol/L (ref 135–145)
TOTAL PROTEIN: 7.4 g/dL (ref 6.5–8.1)

## 2015-07-05 LAB — PROTIME-INR
INR: 1.01 (ref 0.00–1.49)
PROTHROMBIN TIME: 13.5 s (ref 11.6–15.2)

## 2015-07-05 LAB — SURGICAL PCR SCREEN
MRSA, PCR: NEGATIVE
Staphylococcus aureus: NEGATIVE

## 2015-07-05 LAB — APTT: aPTT: 28 seconds (ref 24–37)

## 2015-07-05 MED ORDER — BENZONATATE 100 MG PO CAPS: 100.0000 mg | ORAL_CAPSULE | Freq: Three times a day (TID) | ORAL | Status: DC | PRN

## 2015-07-05 MED ORDER — AMOXICILLIN-POT CLAVULANATE 875-125 MG PO TABS: 1.0000 | ORAL_TABLET | Freq: Two times a day (BID) | ORAL | Status: AC

## 2015-07-05 MED ORDER — AMOXICILLIN-POT CLAVULANATE 875-125 MG PO TABS: 1.0000 | ORAL_TABLET | Freq: Two times a day (BID) | ORAL | Status: DC

## 2015-07-05 MED ORDER — DM-GUAIFENESIN ER 30-600 MG PO TB12: 1.0000 | ORAL_TABLET | Freq: Two times a day (BID) | ORAL | Status: DC

## 2015-07-05 MED ORDER — OXYMETAZOLINE HCL 0.05 % NA SOLN: 1.0000 | Freq: Two times a day (BID) | NASAL | Status: DC

## 2015-07-05 NOTE — Assessment & Plan Note (Signed)
Pt cautioned against using OTC decongestants with his blood pressure. Recommend using topical decongestant like Afrin or Cordicin HBP to prevent elevation in BP.

## 2015-07-05 NOTE — Progress Notes (Addendum)
Clearance note per cardiology/Dr Paraschos 06/07/2015 under care everywhere/epic  initial consult / documentation section  Stress test results 08/04/2011 on chart  EKG epic 05/28/2015

## 2015-07-05 NOTE — Progress Notes (Signed)
Subjective:    Patient ID: Andrew Cobb, male    DOB: 18-Jul-1950, 64 y.o.   MRN: [id number]  HPI: Andrew Cobb is a 64 y.o. male presenting on 07/05/2015 for Sinusitis   HPI  Pt presents for possible sinus infection. Symptoms of facial and pressure x 4 days. No fevers at home. Some coughing. No ear pain. No chest tightness or SOB. Taking mucinex. Purulent dark green drainage from his nose.   Pt is having knee surgery on 6th. [facility].   Past Medical History  Diagnosis Date  . Hypertension   . Diabetes mellitus without complication ([place])   . Stented coronary artery   . MRSA (methicillin resistant Staphylococcus aureus) infection     07/30/2008 thru 08/07/2008  . Coronary artery disease     stent placement / 2  . Sleep apnea     05/16/2014 uses bipap  . 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 Arthritis   . Wears glasses     Current Outpatient Prescriptions on File Prior to Visit  Medication Sig  . Blood Glucose Monitoring Suppl (ONE TOUCH ULTRA SYSTEM KIT) W/DEVICE KIT 1 kit by Does not apply route once. Dx E11.9  . cetirizine (ZYRTEC) 10 MG tablet Take 10 mg by mouth daily.  . Cholecalciferol (VITAMIN D3) 5000 UNITS CAPS Take 1 capsule by mouth daily.  . fluticasone (FLONASE) 50 MCG/ACT nasal spray Place 2 sprays into both nostrils daily as needed. For sinuses (Patient taking differently: Place 2 sprays into both nostrils daily as needed for allergies. )  . glucose blood test strip 1 each by Other route 2 (two) times daily. DX E11.9  . Lancets (ONETOUCH ULTRASOFT) lancets 1 each by Other route 2 (two) times daily. Dx E11.9  . lisinopril (PRINIVIL,ZESTRIL) 10 MG tablet Take 1 tablet (10 mg total) by mouth daily.  . Magnesium 250 MG TABS Take 1 tablet by mouth daily.  . metFORMIN (GLUCOPHAGE) 500 MG tablet Take 1 tablet (500 mg total) by mouth 2 (two) times daily with a meal.  . Multiple  Vitamins-Minerals (MENS MULTIVITAMIN PLUS PO) Take 1 tablet by mouth daily.  . valACYclovir (VALTREX) 500 MG tablet Take 500 mg by mouth 2 (two) times daily.   No current facility-administered medications on file prior to visit.    Review of Systems  Constitutional: Negative for fever and chills.  HENT: Positive for congestion, facial swelling, rhinorrhea and sinus pressure. Negative for ear pain, postnasal drip and sneezing.   Respiratory: Positive for cough. Negative for chest tightness, shortness of breath and wheezing.   Cardiovascular: Negative for chest pain, palpitations and leg swelling.  Musculoskeletal: Negative for neck pain and neck stiffness.  Skin: Negative.   Allergic/Immunologic: Negative for environmental allergies.  Neurological: Negative for headaches.   Per HPI unless specifically indicated above     Objective:    BP 102/68 mmHg  Pulse 63  Temp(Src) 97.9 F (36.6 C) (Oral)  Resp 16  Ht 5' 10.5" (1.791 m)  Wt 208 lb (94.348 kg)  BMI 29.41 kg/m2  SpO2 95%  Wt Readings from Last 3 Encounters:  07/05/15 208 lb (94.348 kg)  07/05/15 208 lb 6 oz (94.518 kg)  05/28/15 207 lb (93.895 kg)    Physical Exam  Constitutional: He appears well-developed and well-nourished. No distress.  HENT:  Head: Normocephalic and atraumatic.  Right Ear: Hearing and tympanic membrane normal. Tympanic membrane is not  erythematous and not bulging.  Left Ear: Hearing and tympanic membrane normal. Tympanic membrane is not erythematous and not bulging.  Nose: Mucosal edema present. No rhinorrhea, sinus tenderness or nasal septal hematoma. Right sinus exhibits frontal sinus tenderness. Right sinus exhibits no maxillary sinus tenderness. Left sinus exhibits frontal sinus tenderness. Left sinus exhibits no maxillary sinus tenderness.  Mouth/Throat: Uvula is midline and mucous membranes are normal. Posterior oropharyngeal erythema present.  Neck: Neck supple. No Brudzinski's sign and no  Kernig's sign noted.  Cardiovascular: Normal rate, regular rhythm and normal heart sounds.   Pulmonary/Chest: Breath sounds normal. No accessory muscle usage. No tachypnea. No respiratory distress.  Lymphadenopathy:    He has no cervical adenopathy.   Results for orders placed or performed during the hospital encounter of 07/05/15  Surgical pcr screen  Result Value Ref Range   MRSA, PCR NEGATIVE NEGATIVE   Staphylococcus aureus NEGATIVE NEGATIVE  APTT  Result Value Ref Range   aPTT 28 24 - 37 seconds  CBC  Result Value Ref Range   WBC 6.8 4.0 - 10.5 K/uL   RBC 4.81 4.22 - 5.81 MIL/uL   Hemoglobin 15.3 13.0 - 17.0 g/dL   HCT 46.4 39.0 - 52.0 %   MCV 96.5 78.0 - 100.0 fL   MCH 31.8 26.0 - 34.0 pg   MCHC 33.0 30.0 - 36.0 g/dL   RDW 13.2 11.5 - 15.5 %   Platelets 155 150 - 400 K/uL  Comprehensive metabolic panel  Result Value Ref Range   Sodium 139 135 - 145 mmol/L   Potassium 4.2 3.5 - 5.1 mmol/L   Chloride 102 101 - 111 mmol/L   CO2 28 22 - 32 mmol/L   Glucose, Bld 113 (H) 65 - 99 mg/dL   BUN 22 (H) 6 - 20 mg/dL   Creatinine, Ser 1.19 0.61 - 1.24 mg/dL   Calcium 9.3 8.9 - 10.3 mg/dL   Total Protein 7.4 6.5 - 8.1 g/dL   Albumin 4.4 3.5 - 5.0 g/dL   AST 31 15 - 41 U/L   ALT 45 17 - 63 U/L   Alkaline Phosphatase 77 38 - 126 U/L   Total Bilirubin 0.8 0.3 - 1.2 mg/dL   GFR calc non Af Amer >60 >60 mL/min   GFR calc Af Amer >60 >60 mL/min   Anion gap 9 5 - 15  Protime-INR  Result Value Ref Range   Prothrombin Time 13.5 11.6 - 15.2 seconds   INR 1.01 0.00 - 1.49  Urinalysis, Routine w reflex microscopic (not at [facility])  Result Value Ref Range   Color, Urine YELLOW YELLOW   APPearance CLEAR CLEAR   Specific Gravity, Urine 1.018 1.005 - 1.030   pH 5.5 5.0 - 8.0   Glucose, UA NEGATIVE NEGATIVE mg/dL   Hgb urine dipstick NEGATIVE NEGATIVE   Bilirubin Urine NEGATIVE NEGATIVE   Ketones, ur NEGATIVE NEGATIVE mg/dL   Protein, ur NEGATIVE NEGATIVE mg/dL   Nitrite NEGATIVE  NEGATIVE   Leukocytes, UA NEGATIVE NEGATIVE  Type and screen Order type and screen if day of surgery is less than 15 days from draw of preadmission visit or order morning of surgery if day of surgery is greater than 6 days from preadmission visit.  Result Value Ref Range   ABO/RH(D) A POS    Antibody Screen NEG    Sample Expiration 07/19/2015    Extend sample reason NO TRANSFUSIONS OR PREGNANCY IN THE PAST 3 MONTHS       Assessment & Plan:  Problem List Items Addressed This Visit      Cardiovascular and Mediastinum   Essential hypertension    Pt cautioned against using OTC decongestants with his blood pressure. Recommend using topical decongestant like Afrin or Cordicin HBP to prevent elevation in BP.        Other Visit Diagnoses    Acute recurrent maxillary sinusitis    -  Primary    Treat for sinus infection given relatively severe symptoms and upcoming surgery. Augmentin BID x 7 days can shorten to 5 day course if preferred to surgery. Home treatment reviewed. Alarm symptoms reviewed.     Relevant Medications    dextromethorphan-guaiFENesin (MUCINEX DM) 30-600 MG 12hr tablet    oxymetazoline (AFRIN NASAL SPRAY) 0.05 % nasal spray    benzonatate (TESSALON) 100 MG capsule    amoxicillin-clavulanate (AUGMENTIN) 875-125 MG tablet       Meds ordered this encounter  Medications  . DISCONTD: amoxicillin-clavulanate (AUGMENTIN) 875-125 MG tablet    Sig: Take 1 tablet by mouth 2 (two) times daily.    Dispense:  14 tablet    Refill:  0    Order Specific Question:  Supervising Provider    Answer:  Arlis Porta [phone]  . DISCONTD: benzonatate (TESSALON) 100 MG capsule    Sig: Take 1 capsule (100 mg total) by mouth 3 (three) times daily as needed.    Dispense:  30 capsule    Refill:  0    Order Specific Question:  Supervising Provider    Answer:  Arlis Porta [[id number]]  . dextromethorphan-guaiFENesin (MUCINEX DM) 30-600 MG 12hr tablet    Sig: Take 1 tablet by mouth  2 (two) times daily.    Dispense:  20 tablet    Refill:  0    Order Specific Question:  Supervising Provider    Answer:  Arlis Porta [phone]  . oxymetazoline (AFRIN NASAL SPRAY) 0.05 % nasal spray    Sig: Place 1 spray into both nostrils 2 (two) times daily.    Dispense:  30 mL    Refill:  0    Order Specific Question:  Supervising Provider    Answer:  Arlis Porta [[id number]]  . benzonatate (TESSALON) 100 MG capsule    Sig: Take 1 capsule (100 mg total) by mouth 3 (three) times daily as needed.    Dispense:  30 capsule    Refill:  0  . amoxicillin-clavulanate (AUGMENTIN) 875-125 MG tablet    Sig: Take 1 tablet by mouth 2 (two) times daily.    Dispense:  14 tablet    Refill:  0      Follow up plan: Return if symptoms worsen or fail to improve.

## 2015-07-05 NOTE — Patient Instructions (Signed)
Andrew Cobb  07/05/2015   Your procedure is scheduled on: Friday July 13, 2015  Report to [facility] Main  Entrance take [place]  elevators to 3rd floor to  [place] at 7:15 AM.  Call this number if you have problems the morning of surgery [phone]   Remember: ONLY 1 PERSON MAY GO WITH YOU TO SHORT STAY TO GET  READY MORNING OF [place].  Do not eat food or drink liquids :After Midnight.     Take these medicines the morning of surgery with A SIP OF WATER: Cetirizine (Zyrtec); May use Flonase nasal spray if needed; Valacyclovir (Valtrex) DO NOT TAKE ANY DIABETIC MEDICATIONS DAY OF YOUR SURGERY                               You may not have any metal on your body including hair pins and              piercings  Do not wear jewelry, lotions, powders or colognes, deodorant                           Men may shave face and neck.   Do not bring valuables to the hospital. [place].  Contacts, dentures or bridgework may not be worn into surgery.  Leave suitcase in the car. After surgery it may be brought to your room.                Please read over the following fact sheets you were given:MRSA INFORMATION SHEET; INCENTIVE SPIROMETER; BLOOD TRANSFUSION INFORMATION SHEET  _____________________________________________________________________             [facility] - Preparing for Surgery Before surgery, you can play an important role.  Because skin is not sterile, your skin needs to be as free of germs as possible.  You can reduce the number of germs on your skin by washing with CHG (chlorahexidine gluconate) soap before surgery.  CHG is an antiseptic cleaner which kills germs and bonds with the skin to continue killing germs even after washing. Please DO NOT use if you have an allergy to CHG or antibacterial soaps.  If your skin becomes reddened/irritated stop using the CHG and inform your nurse  when you arrive at Short Stay. Do not shave (including legs and underarms) for at least 48 hours prior to the first CHG shower.  You may shave your face/neck. Please follow these instructions carefully:  1.  Shower with CHG Soap the night before surgery and the  morning of Surgery.  2.  If you choose to wash your hair, wash your hair first as usual with your  normal  shampoo.  3.  After you shampoo, rinse your hair and body thoroughly to remove the  shampoo.                           4.  Use CHG as you would any other liquid soap.  You can apply chg directly  to the skin and wash  Gently with a scrungie or clean washcloth.  5.  Apply the CHG Soap to your body ONLY FROM THE NECK DOWN.   Do not use on face/ open                           Wound or open sores. Avoid contact with eyes, ears mouth and genitals (private parts).                       Wash face,  Genitals (private parts) with your normal soap.             6.  Wash thoroughly, paying special attention to the area where your surgery  will be performed.  7.  Thoroughly rinse your body with warm water from the neck down.  8.  DO NOT shower/wash with your normal soap after using and rinsing off  the CHG Soap.                9.  Pat yourself dry with a clean towel.            10.  Wear clean pajamas.            11.  Place clean sheets on your bed the night of your first shower and do not  sleep with pets. Day of Surgery : Do not apply any lotions/deodorants the morning of surgery.  Please wear clean clothes to the hospital/surgery center.  FAILURE TO FOLLOW THESE INSTRUCTIONS MAY RESULT IN THE CANCELLATION OF YOUR SURGERY PATIENT SIGNATURE_________________________________  NURSE SIGNATURE__________________________________  ________________________________________________________________________   Adam Phenix  An incentive spirometer is a tool that can help keep your lungs clear and active. This tool  measures how well you are filling your lungs with each breath. Taking long deep breaths may help reverse or decrease the chance of developing breathing (pulmonary) problems (especially infection) following:  A long period of time when you are unable to move or be active. BEFORE THE PROCEDURE   If the spirometer includes an indicator to show your best effort, your nurse or respiratory therapist will set it to a desired goal.  If possible, sit up straight or lean slightly forward. Try not to slouch.  Hold the incentive spirometer in an upright position. INSTRUCTIONS FOR USE   Sit on the edge of your bed if possible, or sit up as far as you can in bed or on a chair.  Hold the incentive spirometer in an upright position.  Breathe out normally.  Place the mouthpiece in your mouth and seal your lips tightly around it.  Breathe in slowly and as deeply as possible, raising the piston or the ball toward the top of the column.  Hold your breath for 3-5 seconds or for as long as possible. Allow the piston or ball to fall to the bottom of the column.  Remove the mouthpiece from your mouth and breathe out normally.  Rest for a few seconds and repeat Steps 1 through 7 at least 10 times every 1-2 hours when you are awake. Take your time and take a few normal breaths between deep breaths.  The spirometer may include an indicator to show your best effort. Use the indicator as a goal to work toward during each repetition.  After each set of 10 deep breaths, practice coughing to be sure your lungs are clear. If you have an incision (the cut made at the time of surgery),  support your incision when coughing by placing a pillow or rolled up towels firmly against it. Once you are able to get out of bed, walk around indoors and cough well. You may stop using the incentive spirometer when instructed by your caregiver.  RISKS AND COMPLICATIONS  Take your time so you do not get dizzy or light-headed.  If  you are in pain, you may need to take or ask for pain medication before doing incentive spirometry. It is harder to take a deep breath if you are having pain. AFTER USE  Rest and breathe slowly and easily.  It can be helpful to keep track of a log of your progress. Your caregiver can provide you with a simple table to help with this. If you are using the spirometer at home, follow these instructions: [place] IF:   You are having difficultly using the spirometer.  You have trouble using the spirometer as often as instructed.  Your pain medication is not giving enough relief while using the spirometer.  You develop fever of 100.5 F (38.1 C) or higher. SEEK IMMEDIATE MEDICAL CARE IF:   You cough up bloody sputum that had not been present before.  You develop fever of 102 F (38.9 C) or greater.  You develop worsening pain at or near the incision site. MAKE SURE YOU:   Understand these instructions.  Will watch your condition.  Will get help right away if you are not doing well or get worse. Document Released: 11/03/2006 Document Revised: 09/15/2011 Document Reviewed: 01/04/2007 ExitCare Patient Information 2014 ExitCare, [place].   ________________________________________________________________________  WHAT IS A BLOOD TRANSFUSION? Blood Transfusion Information  A transfusion is the replacement of blood or some of its parts. Blood is made up of multiple cells which provide different functions.  Red blood cells carry oxygen and are used for blood loss replacement.  White blood cells fight against infection.  Platelets control bleeding.  Plasma helps clot blood.  Other blood products are available for specialized needs, such as hemophilia or other clotting disorders. BEFORE THE TRANSFUSION  Who gives blood for transfusions?   Healthy volunteers who are fully evaluated to make sure their blood is safe. This is blood bank blood. Transfusion therapy is the  safest it has ever been in the practice of medicine. Before blood is taken from a donor, a complete history is taken to make sure that person has no history of diseases nor engages in risky social behavior (examples are intravenous drug use or sexual activity with multiple partners). The donor's travel history is screened to minimize risk of transmitting infections, such as malaria. The donated blood is tested for signs of infectious diseases, such as HIV and hepatitis. The blood is then tested to be sure it is compatible with you in order to minimize the chance of a transfusion reaction. If you or a relative donates blood, this is often done in anticipation of surgery and is not appropriate for emergency situations. It takes many days to process the donated blood. RISKS AND COMPLICATIONS Although transfusion therapy is very safe and saves many lives, the main dangers of transfusion include:   Getting an infectious disease.  Developing a transfusion reaction. This is an allergic reaction to something in the blood you were given. Every precaution is taken to prevent this. The decision to have a blood transfusion has been considered carefully by your caregiver before blood is given. Blood is not given unless the benefits outweigh the risks. AFTER THE TRANSFUSION  Right after receiving a blood transfusion, you will usually feel much better and more energetic. This is especially true if your red blood cells have gotten low (anemic). The transfusion raises the level of the red blood cells which carry oxygen, and this usually causes an energy increase.  The nurse administering the transfusion will monitor you carefully for complications. HOME CARE INSTRUCTIONS  No special instructions are needed after a transfusion. You may find your energy is better. Speak with your caregiver about any limitations on activity for underlying diseases you may have. SEEK MEDICAL CARE IF:   Your condition is not improving  after your transfusion.  You develop redness or irritation at the intravenous (IV) site. SEEK IMMEDIATE MEDICAL CARE IF:  Any of the following symptoms occur over the next 12 hours:  Shaking chills.  You have a temperature by mouth above 102 F (38.9 C), not controlled by medicine.  Chest, back, or muscle pain.  People around you feel you are not acting correctly or are confused.  Shortness of breath or difficulty breathing.  Dizziness and fainting.  You get a rash or develop hives.  You have a decrease in urine output.  Your urine turns a dark color or changes to pink, red, or brown. Any of the following symptoms occur over the next 10 days:  You have a temperature by mouth above 102 F (38.9 C), not controlled by medicine.  Shortness of breath.  Weakness after normal activity.  The white part of the eye turns yellow (jaundice).  You have a decrease in the amount of urine or are urinating less often.  Your urine turns a dark color or changes to pink, red, or brown. Document Released: 06/20/2000 Document Revised: 09/15/2011 Document Reviewed: 02/07/2008 [facility] Patient Information 2014 [place], [place].  _______________________________________________________________________

## 2015-07-05 NOTE — Patient Instructions (Addendum)
Please let your surgeon know that you were placed on antibiotics today. This probably won't affect your surgery but they do like to know. If needed you can just take a 5 day course of antibiotics.   Sinusitis, Adult Sinusitis is redness, soreness, and inflammation of the paranasal sinuses. Paranasal sinuses are air pockets within the bones of your face. They are located beneath your eyes, in the middle of your forehead, and above your eyes. In healthy paranasal sinuses, mucus is able to drain out, and air is able to circulate through them by way of your nose. However, when your paranasal sinuses are inflamed, mucus and air can become trapped. This can allow bacteria and other germs to grow and cause infection. Sinusitis can develop quickly and last only a short time (acute) or continue over a long period (chronic). Sinusitis that lasts for more than 12 weeks is considered chronic. CAUSES Causes of sinusitis include:  Allergies.  Structural abnormalities, such as displacement of the cartilage that separates your nostrils (deviated septum), which can decrease the air flow through your nose and sinuses and affect sinus drainage.  Functional abnormalities, such as when the small hairs (cilia) that line your sinuses and help remove mucus do not work properly or are not present. SIGNS AND SYMPTOMS Symptoms of acute and chronic sinusitis are the same. The primary symptoms are pain and pressure around the affected sinuses. Other symptoms include:  Upper toothache.  Earache.  Headache.  Bad breath.  Decreased sense of smell and taste.  A cough, which worsens when you are lying flat.  Fatigue.  Fever.  Thick drainage from your nose, which often is green and may contain pus (purulent).  Swelling and warmth over the affected sinuses. DIAGNOSIS Your health care provider will perform a physical exam. During your exam, your health care provider may perform any of the following to help determine  if you have acute sinusitis or chronic sinusitis:  Look in your nose for signs of abnormal growths in your nostrils (nasal polyps).  Tap over the affected sinus to check for signs of infection.  View the inside of your sinuses using an imaging device that has a light attached (endoscope). If your health care provider suspects that you have chronic sinusitis, one or more of the following tests may be recommended:  Allergy tests.  Nasal culture. A sample of mucus is taken from your nose, sent to a lab, and screened for bacteria.  Nasal cytology. A sample of mucus is taken from your nose and examined by your health care provider to determine if your sinusitis is related to an allergy. TREATMENT Most cases of acute sinusitis are related to a viral infection and will resolve on their own within 10 days. Sometimes, medicines are prescribed to help relieve symptoms of both acute and chronic sinusitis. These may include pain medicines, decongestants, nasal steroid sprays, or saline sprays. However, for sinusitis related to a bacterial infection, your health care provider will prescribe antibiotic medicines. These are medicines that will help kill the bacteria causing the infection. Rarely, sinusitis is caused by a fungal infection. In these cases, your health care provider will prescribe antifungal medicine. For some cases of chronic sinusitis, surgery is needed. Generally, these are cases in which sinusitis recurs more than 3 times per year, despite other treatments. HOME CARE INSTRUCTIONS  Drink plenty of water. Water helps thin the mucus so your sinuses can drain more easily.  Use a humidifier.  Inhale steam 3-4 times a day (  for example, sit in the bathroom with the shower running).  Apply a warm, moist washcloth to your face 3-4 times a day, or as directed by your health care provider.  Use saline nasal sprays to help moisten and clean your sinuses.  Take medicines only as directed by your  health care provider.  If you were prescribed either an antibiotic or antifungal medicine, finish it all even if you start to feel better. SEEK IMMEDIATE MEDICAL CARE IF:  You have increasing pain or severe headaches.  You have nausea, vomiting, or drowsiness.  You have swelling around your face.  You have vision problems.  You have a stiff neck.  You have difficulty breathing.   This information is not intended to replace advice given to you by your health care provider. Make sure you discuss any questions you have with your health care provider.   Document Released: 06/23/2005 Document Revised: 07/14/2014 Document Reviewed: 07/08/2011 Elsevier Interactive Patient Education [employer].

## 2015-07-06 LAB — HEMOGLOBIN A1C
HEMOGLOBIN A1C: 6.5 % — AB (ref 4.8–5.6)
Mean Plasma Glucose: 140 mg/dL

## 2015-07-06 NOTE — Progress Notes (Signed)
HGA1C results in epic per PAT visit 07/05/2015 sent to Dr Wynelle Link

## 2015-07-10 NOTE — H&P (Signed)
TOTAL KNEE ADMISSION H&P  Patient is being admitted for left total knee arthroplasty.  Subjective:  Chief Complaint:left knee pain.  HPI: Andrew Cobb, 65 y.o. male, has a history of pain and functional disability in the left knee due to arthritis and has failed non-surgical conservative treatments for greater than 12 weeks to includeNSAID's and/or analgesics, corticosteriod injections, flexibility and strengthening excercises and activity modification.  Onset of symptoms was gradual, starting >10 years ago with gradually worsening course since that time. The patient noted prior procedures on the knee to include  arthroscopy and menisectomy on the left knee(s).  Patient currently rates pain in the left knee(s) at 7 out of 10 with activity. Patient has night pain, worsening of pain with activity and weight bearing, pain that interferes with activities of daily living, pain with passive range of motion, crepitus and joint swelling.  Patient has evidence of periarticular osteophytes and joint space narrowing by imaging studies. There is no active infection.  Patient Active Problem List   Diagnosis Date Noted  . Bradycardia 05/28/2015  . Annual physical exam 04/24/2015  . Essential hypertension 04/24/2015  . Type 2 diabetes mellitus without complication, without long-term current use of insulin ([place]) 04/24/2015  . Need for influenza vaccination 04/24/2015  . SINUSITIS, CHRONIC 08/13/2009  . DERMATITIS DUE DRUGS&MEDICINES TAKEN INTERNALLY 08/13/2009  . COUGH 05/21/2009  . METHICILLIN SUSCEPTIBLE STAPH INF CCE & UNS SITE 04/02/2009  . METHICILLIN RESISTANT STAPHYLOCOCCUS AUREUS INFECTION 04/02/2009  . DIABETES MELLITUS, TYPE II 04/02/2009  . HYPERLIPIDEMIA 04/02/2009  . CAD 04/02/2009  . ASTHMA 04/02/2009  . HYPERTROPHY PROSTATE W/UR OBST & OTH LUTS 04/02/2009  . BOILS, RECURRENT 04/02/2009  . 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 DEGENERATIVE JOINT DISEASE, KNEES, BILATERAL  04/02/2009  . SLEEP APNEA 04/02/2009   Past Medical History  Diagnosis Date  . Hypertension   . Diabetes mellitus without complication ([place])   . Stented coronary artery   . MRSA (methicillin resistant Staphylococcus aureus) infection     07/30/2008 thru 08/07/2008  . Coronary artery disease     stent placement / 2  . Sleep apnea     05/16/2014 uses bipap  . 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 Arthritis   . Wears glasses     Past Surgical History  Procedure Laterality Date  . Cholecystectomy      2006  . Gastric bypass      10/05/2012  . Angioplasty    . Hernia repair      left inguinal 1981  . Appendectomy      1966  . Joint replacement      right knee 04/2006  . Functional endoscopic sinus surgery      11/10/2013  . Left knee meniscal tear       01/25/2010  . Right shoulder replacement       01/27/2006  . Left rotator cuff repair       05/03/2003   . Left ankle surgery       05/03/2003   . Right ankle surgery       fracture has 2 screws 07/07/1997  . Left knee meniscal tear repair       05/04/1996  . Angioplasty      with stent 04/07/1995  . Left carpel tunnel       09/18/1993  . Right carpel tunnel       05/16/1992  . Cardiovascular stress  test      07/31/2011  . Colonscopy       08/25/2012      Current outpatient prescriptions:  .  cetirizine (ZYRTEC) 10 MG tablet, Take 10 mg by mouth daily., Disp: , Rfl:  .  Cholecalciferol (VITAMIN D3) 5000 UNITS CAPS, Take 1 capsule by mouth daily., Disp: , Rfl:  .  fluticasone (FLONASE) 50 MCG/ACT nasal spray, Place 2 sprays into both nostrils daily as needed. For sinuses (Patient taking differently: Place 2 sprays into both nostrils daily as needed for allergies. ), Disp: 48 g, Rfl: 3 .  lisinopril (PRINIVIL,ZESTRIL) 10 MG tablet, Take 1 tablet (10 mg total) by mouth daily., Disp: 90 tablet, Rfl: 3 .  Magnesium 250 MG TABS, Take 1 tablet by mouth daily., Disp:  , Rfl:  .  metFORMIN (GLUCOPHAGE) 500 MG tablet, Take 1 tablet (500 mg total) by mouth 2 (two) times daily with a meal., Disp: 180 tablet, Rfl: 3 .  Multiple Vitamins-Minerals (MENS MULTIVITAMIN PLUS PO), Take 1 tablet by mouth daily., Disp: , Rfl:  .  valACYclovir (VALTREX) 500 MG tablet, Take 500 mg by mouth 2 (two) times daily., Disp: , Rfl:  .  amoxicillin-clavulanate (AUGMENTIN) 875-125 MG tablet, Take 1 tablet by mouth 2 (two) times daily., Disp: 14 tablet, Rfl: 0 .  benzonatate (TESSALON) 100 MG capsule, Take 1 capsule (100 mg total) by mouth 3 (three) times daily as needed., Disp: 30 capsule, Rfl: 0 .  Blood Glucose Monitoring Suppl (ONE TOUCH ULTRA SYSTEM KIT) W/DEVICE KIT, 1 kit by Does not apply route once. Dx E11.9, Disp: 1 each, Rfl: 0 .  dextromethorphan-guaiFENesin (MUCINEX DM) 30-600 MG 12hr tablet, Take 1 tablet by mouth 2 (two) times daily., Disp: 20 tablet, Rfl: 0 .  glucose blood test strip, 1 each by Other route 2 (two) times daily. DX E11.9, Disp: 100 each, Rfl: 12 .  Lancets (ONETOUCH ULTRASOFT) lancets, 1 each by Other route 2 (two) times daily. Dx E11.9, Disp: 100 each, Rfl: 12 .  oxymetazoline (AFRIN NASAL SPRAY) 0.05 % nasal spray, Place 1 spray into both nostrils 2 (two) times daily., Disp: 30 mL, Rfl: 0  Allergies  Allergen Reactions  . Sulfamethoxazole-Trimethoprim     REACTION: Drug rash  . Tetracyclines & Related Rash    Social History  Substance Use Topics  . Smoking status: Former Smoker -- 1.00 packs/day for 10 years    Types: Cigarettes    Quit date: 07/07/1984  . Smokeless tobacco: Never Used     Comment: quit 1986  . Alcohol Use: No    Family History  Problem Relation Age of Onset  . Cancer Mother     pancreatic  . Diabetes Mother   . Stroke Mother   . Heart disease Mother   . Heart disease Father   . Stroke Father   . Diabetes Father   . Cancer Sister   . Cancer Brother     lung  . Cancer Brother      Review of Systems   Constitutional: Negative.   HENT: Negative.   Eyes: Negative.   Respiratory: Positive for shortness of breath. Negative for cough, hemoptysis, sputum production and wheezing.   Cardiovascular: Negative.   Gastrointestinal: Negative.   Genitourinary: Negative.   Musculoskeletal: Positive for joint pain. Negative for myalgias, back pain, falls and neck pain.       Left knee pain  Skin: Negative.   Neurological: Negative.   Endo/Heme/Allergies: Negative.   Psychiatric/Behavioral: Negative.  Objective:  Physical Exam  Constitutional: He is oriented to person, place, and time. He appears well-developed. No distress.  Overweight  HENT:  Head: Normocephalic and atraumatic.  Right Ear: External ear normal.  Left Ear: External ear normal.  Nose: Nose normal.  Mouth/Throat: Oropharynx is clear and moist.  Eyes: Conjunctivae and EOM are normal.  Neck: Normal range of motion. Neck supple.  Cardiovascular: Normal rate, regular rhythm, normal heart sounds and intact distal pulses.   No murmur heard. Respiratory: Effort normal and breath sounds normal. No respiratory distress. He has no wheezes.  GI: Soft. Bowel sounds are normal. He exhibits no distension. There is no tenderness.  Musculoskeletal:       Right hip: Normal.       Left hip: Normal.       Right knee: Normal.  His left knee shows no effusion. A slight valgus range about 5 to 125, moderate crepitus on range of motion with tenderness lateral greater than medial with no instability.  Neurological: He is alert and oriented to person, place, and time. He has normal strength and normal reflexes. No sensory deficit.  Skin: No rash noted. He is not diaphoretic. No erythema.  Psychiatric: He has a normal mood and affect. His behavior is normal.    Vitals  Weight: 200 lb Height: 70.5in Body Surface Area: 2.1 m Body Mass Index: 28.29 kg/m  Pulse: 58 (Regular)  BP: 126/72 (Sitting, Left Arm, Standard)  Imaging  Review Plain radiographs demonstrate severe degenerative joint disease of the left knee(s). The overall alignment ismild valgus. The bone quality appears to be good for age and reported activity level.  Assessment/Plan:  End stage primary osteoarthritis, left knee   The patient history, physical examination, clinical judgment of the provider and imaging studies are consistent with end stage degenerative joint disease of the left knee(s) and total knee arthroplasty is deemed medically necessary. The treatment options including medical management, injection therapy arthroscopy and arthroplasty were discussed at length. The risks and benefits of total knee arthroplasty were presented and reviewed. The risks due to aseptic loosening, infection, stiffness, patella tracking problems, thromboembolic complications and other imponderables were discussed. The patient acknowledged the explanation, agreed to proceed with the plan and consent was signed. Patient is being admitted for inpatient treatment for surgery, pain control, PT, OT, prophylactic antibiotics, VTE prophylaxis, progressive ambulation and ADL's and discharge planning. The patient is planning to be discharged home with home health services   PCP: Dr. Ricardo Jericho, PA-C

## 2015-07-13 ENCOUNTER — Inpatient Hospital Stay (HOSPITAL_COMMUNITY): Payer: PPO | Admitting: Certified Registered Nurse Anesthetist

## 2015-07-13 ENCOUNTER — Encounter (HOSPITAL_COMMUNITY)
Admission: RE | Disposition: A | Discharge status: Home Health | Payer: Self-pay | Source: Ambulatory Visit | Attending: Orthopedic Surgery

## 2015-07-13 ENCOUNTER — Inpatient Hospital Stay (HOSPITAL_COMMUNITY)
Admission: RE | Admit: 2015-07-13 | Discharge: 2015-07-16 | DRG: 470 | Disposition: A | Discharge status: Home Health | Payer: PPO | Source: Ambulatory Visit | Attending: Orthopedic Surgery | Admitting: Orthopedic Surgery

## 2015-07-13 ENCOUNTER — Encounter (HOSPITAL_COMMUNITY): Payer: Self-pay | Admitting: *Deleted

## 2015-07-13 DIAGNOSIS — G4730 Sleep apnea, unspecified: Secondary | ICD-10-CM | POA: Diagnosis not present

## 2015-07-13 DIAGNOSIS — M179 Osteoarthritis of knee, unspecified: Secondary | ICD-10-CM | POA: Diagnosis present

## 2015-07-13 DIAGNOSIS — I10 Essential (primary) hypertension: Secondary | ICD-10-CM | POA: Diagnosis present

## 2015-07-13 DIAGNOSIS — Z8614 Personal history of Methicillin resistant Staphylococcus aureus infection: Secondary | ICD-10-CM | POA: Diagnosis not present

## 2015-07-13 DIAGNOSIS — I2510 Atherosclerotic heart disease of native coronary artery without angina pectoris: Secondary | ICD-10-CM | POA: Diagnosis not present

## 2015-07-13 DIAGNOSIS — Z8249 Family history of ischemic heart disease and other diseases of the circulatory system: Secondary | ICD-10-CM | POA: Diagnosis not present

## 2015-07-13 DIAGNOSIS — Z9884 Bariatric surgery status: Secondary | ICD-10-CM | POA: Diagnosis not present

## 2015-07-13 DIAGNOSIS — Z800 Family history of malignant neoplasm of digestive organs: Secondary | ICD-10-CM

## 2015-07-13 DIAGNOSIS — Z96611 Presence of right artificial shoulder joint: Secondary | ICD-10-CM | POA: Diagnosis present

## 2015-07-13 DIAGNOSIS — E785 Hyperlipidemia, unspecified: Secondary | ICD-10-CM | POA: Diagnosis present

## 2015-07-13 DIAGNOSIS — Z833 Family history of diabetes mellitus: Secondary | ICD-10-CM | POA: Diagnosis not present

## 2015-07-13 DIAGNOSIS — M25762 Osteophyte, left knee: Secondary | ICD-10-CM | POA: Diagnosis not present

## 2015-07-13 DIAGNOSIS — M25562 Pain in left knee: Secondary | ICD-10-CM | POA: Diagnosis not present

## 2015-07-13 DIAGNOSIS — Z801 Family history of malignant neoplasm of trachea, bronchus and lung: Secondary | ICD-10-CM

## 2015-07-13 DIAGNOSIS — K219 Gastro-esophageal reflux disease without esophagitis: Secondary | ICD-10-CM | POA: Diagnosis present

## 2015-07-13 DIAGNOSIS — Z809 Family history of malignant neoplasm, unspecified: Secondary | ICD-10-CM | POA: Diagnosis not present

## 2015-07-13 DIAGNOSIS — E119 Type 2 diabetes mellitus without complications: Secondary | ICD-10-CM | POA: Diagnosis present

## 2015-07-13 DIAGNOSIS — Z955 Presence of coronary angioplasty implant and graft: Secondary | ICD-10-CM

## 2015-07-13 DIAGNOSIS — Z87891 Personal history of nicotine dependence: Secondary | ICD-10-CM

## 2015-07-13 DIAGNOSIS — Z881 Allergy status to other antibiotic agents status: Secondary | ICD-10-CM | POA: Diagnosis not present

## 2015-07-13 DIAGNOSIS — J45909 Unspecified asthma, uncomplicated: Secondary | ICD-10-CM | POA: Diagnosis not present

## 2015-07-13 DIAGNOSIS — M1712 Unilateral primary osteoarthritis, left knee: Secondary | ICD-10-CM

## 2015-07-13 DIAGNOSIS — Z79899 Other long term (current) drug therapy: Secondary | ICD-10-CM

## 2015-07-13 DIAGNOSIS — Z823 Family history of stroke: Secondary | ICD-10-CM

## 2015-07-13 DIAGNOSIS — Z96651 Presence of right artificial knee joint: Secondary | ICD-10-CM | POA: Diagnosis not present

## 2015-07-13 DIAGNOSIS — Z7984 Long term (current) use of oral hypoglycemic drugs: Secondary | ICD-10-CM | POA: Diagnosis not present

## 2015-07-13 DIAGNOSIS — M1710 Unilateral primary osteoarthritis, unspecified knee: Secondary | ICD-10-CM | POA: Diagnosis present

## 2015-07-13 DIAGNOSIS — Z882 Allergy status to sulfonamides status: Secondary | ICD-10-CM | POA: Diagnosis not present

## 2015-07-13 HISTORY — PX: TOTAL KNEE ARTHROPLASTY: SHX125

## 2015-07-13 HISTORY — PX: REPLACEMENT TOTAL KNEE BILATERAL: SUR1225

## 2015-07-13 LAB — GLUCOSE, CAPILLARY
GLUCOSE-CAPILLARY: 107 mg/dL — AB (ref 65–99)
GLUCOSE-CAPILLARY: 100 mg/dL — AB (ref 65–99)
GLUCOSE-CAPILLARY: 139 mg/dL — AB (ref 65–99)

## 2015-07-13 SURGERY — ARTHROPLASTY, KNEE, TOTAL
Anesthesia: Spinal | Site: Knee | Laterality: Left

## 2015-07-13 MED ORDER — METOCLOPRAMIDE HCL 10 MG PO TABS: 5.0000 mg | ORAL_TABLET | Freq: Three times a day (TID) | ORAL | Status: DC | PRN

## 2015-07-13 MED ORDER — STERILE WATER FOR IRRIGATION IR SOLN
Status: DC | PRN
  Administered 2015-07-13: 1000 mL

## 2015-07-13 MED ORDER — MIDAZOLAM HCL 5 MG/5ML IJ SOLN
INTRAMUSCULAR | Status: DC | PRN
  Administered 2015-07-13: 2 mg via INTRAVENOUS

## 2015-07-13 MED ORDER — TRANEXAMIC ACID 1000 MG/10ML IV SOLN
2000.0000 mg | INTRAVENOUS | Status: DC | PRN
  Administered 2015-07-13: 2000 mg via TOPICAL

## 2015-07-13 MED ORDER — ONDANSETRON HCL 4 MG/2ML IJ SOLN: 4.0000 mg | Freq: Once | INTRAMUSCULAR | Status: DC | PRN

## 2015-07-13 MED ORDER — DM-GUAIFENESIN ER 30-600 MG PO TB12
1.0000 | ORAL_TABLET | Freq: Two times a day (BID) | ORAL | Status: DC
  Administered 2015-07-13 – 2015-07-16 (×5): 1 via ORAL
  Filled 2015-07-13 (×7): qty 1

## 2015-07-13 MED ORDER — BUPIVACAINE LIPOSOME 1.3 % IJ SUSP
INTRAMUSCULAR | Status: DC | PRN
  Administered 2015-07-13: 20 mL

## 2015-07-13 MED ORDER — METOCLOPRAMIDE HCL 5 MG/ML IJ SOLN: 5.0000 mg | Freq: Three times a day (TID) | INTRAMUSCULAR | Status: DC | PRN

## 2015-07-13 MED ORDER — FENTANYL CITRATE (PF) 100 MCG/2ML IJ SOLN
INTRAMUSCULAR | Status: DC | PRN
  Administered 2015-07-13 (×2): 50 ug via INTRAVENOUS

## 2015-07-13 MED ORDER — DOCUSATE SODIUM 100 MG PO CAPS
100.0000 mg | ORAL_CAPSULE | Freq: Two times a day (BID) | ORAL | Status: DC
  Administered 2015-07-13 – 2015-07-16 (×6): 100 mg via ORAL

## 2015-07-13 MED ORDER — ACETAMINOPHEN 500 MG PO TABS
1000.0000 mg | ORAL_TABLET | Freq: Four times a day (QID) | ORAL | Status: AC
  Administered 2015-07-13 – 2015-07-14 (×4): 1000 mg via ORAL
  Filled 2015-07-13 (×5): qty 2

## 2015-07-13 MED ORDER — ACETAMINOPHEN 325 MG PO TABS: 650.0000 mg | ORAL_TABLET | Freq: Four times a day (QID) | ORAL | Status: DC | PRN

## 2015-07-13 MED ORDER — FENTANYL CITRATE (PF) 100 MCG/2ML IJ SOLN: 25.0000 ug | INTRAMUSCULAR | Status: DC | PRN

## 2015-07-13 MED ORDER — SODIUM CHLORIDE 0.9 % IJ SOLN
INTRAMUSCULAR | Status: AC
  Filled 2015-07-13: qty 50

## 2015-07-13 MED ORDER — BUPIVACAINE LIPOSOME 1.3 % IJ SUSP
20.0000 mL | Freq: Once | INTRAMUSCULAR | Status: DC
  Filled 2015-07-13: qty 20

## 2015-07-13 MED ORDER — LACTATED RINGERS IV SOLN
INTRAVENOUS | Status: DC
Start: 2015-07-13 — End: 2015-07-13

## 2015-07-13 MED ORDER — CHLORHEXIDINE GLUCONATE 4 % EX LIQD: 60.0000 mL | Freq: Once | CUTANEOUS | Status: DC

## 2015-07-13 MED ORDER — BENZONATATE 100 MG PO CAPS: 100.0000 mg | ORAL_CAPSULE | Freq: Three times a day (TID) | ORAL | Status: DC | PRN

## 2015-07-13 MED ORDER — PROPOFOL 10 MG/ML IV BOLUS
INTRAVENOUS | Status: AC
  Filled 2015-07-13: qty 60

## 2015-07-13 MED ORDER — FLUTICASONE PROPIONATE 50 MCG/ACT NA SUSP
2.0000 | Freq: Every day | NASAL | Status: DC | PRN
  Filled 2015-07-13: qty 16

## 2015-07-13 MED ORDER — BUPIVACAINE HCL (PF) 0.25 % IJ SOLN
INTRAMUSCULAR | Status: AC
  Filled 2015-07-13: qty 30

## 2015-07-13 MED ORDER — METFORMIN HCL 500 MG PO TABS
500.0000 mg | ORAL_TABLET | Freq: Two times a day (BID) | ORAL | Status: DC
  Administered 2015-07-14 – 2015-07-16 (×5): 500 mg via ORAL
  Filled 2015-07-13 (×7): qty 1

## 2015-07-13 MED ORDER — OXYCODONE HCL 5 MG PO TABS
5.0000 mg | ORAL_TABLET | ORAL | Status: DC | PRN
  Administered 2015-07-13 – 2015-07-16 (×11): 10 mg via ORAL
  Filled 2015-07-13 (×11): qty 2

## 2015-07-13 MED ORDER — FLEET ENEMA 7-19 GM/118ML RE ENEM: 1.0000 | ENEMA | Freq: Once | RECTAL | Status: DC | PRN

## 2015-07-13 MED ORDER — ACETAMINOPHEN 650 MG RE SUPP: 650.0000 mg | Freq: Four times a day (QID) | RECTAL | Status: DC | PRN

## 2015-07-13 MED ORDER — METHOCARBAMOL 500 MG PO TABS
500.0000 mg | ORAL_TABLET | Freq: Four times a day (QID) | ORAL | Status: DC | PRN
  Administered 2015-07-14 – 2015-07-16 (×4): 500 mg via ORAL
  Filled 2015-07-13 (×6): qty 1

## 2015-07-13 MED ORDER — ONDANSETRON HCL 4 MG/2ML IJ SOLN
INTRAMUSCULAR | Status: DC | PRN
  Administered 2015-07-13: 4 mg via INTRAVENOUS

## 2015-07-13 MED ORDER — PHENOL 1.4 % MT LIQD
1.0000 | OROMUCOSAL | Status: DC | PRN
  Filled 2015-07-13: qty 177

## 2015-07-13 MED ORDER — PROPOFOL 10 MG/ML IV BOLUS
INTRAVENOUS | Status: DC | PRN
  Administered 2015-07-13 (×4): 10 mg via INTRAVENOUS

## 2015-07-13 MED ORDER — DEXAMETHASONE SODIUM PHOSPHATE 10 MG/ML IJ SOLN
10.0000 mg | Freq: Once | INTRAMUSCULAR | Status: AC
  Administered 2015-07-13: 10 mg via INTRAVENOUS

## 2015-07-13 MED ORDER — METHOCARBAMOL 1000 MG/10ML IJ SOLN
500.0000 mg | Freq: Four times a day (QID) | INTRAVENOUS | Status: DC | PRN
  Filled 2015-07-13: qty 5

## 2015-07-13 MED ORDER — DEXAMETHASONE SODIUM PHOSPHATE 10 MG/ML IJ SOLN
10.0000 mg | Freq: Once | INTRAMUSCULAR | Status: AC
  Administered 2015-07-14: 10 mg via INTRAVENOUS
  Filled 2015-07-13: qty 1

## 2015-07-13 MED ORDER — 0.9 % SODIUM CHLORIDE (POUR BTL) OPTIME
TOPICAL | Status: DC | PRN
  Administered 2015-07-13: 1000 mL

## 2015-07-13 MED ORDER — ACETAMINOPHEN 10 MG/ML IV SOLN
INTRAVENOUS | Status: AC
  Filled 2015-07-13: qty 100

## 2015-07-13 MED ORDER — CEFAZOLIN SODIUM-DEXTROSE 2-3 GM-% IV SOLR
2.0000 g | INTRAVENOUS | Status: AC
  Administered 2015-07-13: 2 g via INTRAVENOUS

## 2015-07-13 MED ORDER — ACETAMINOPHEN 10 MG/ML IV SOLN
1000.0000 mg | Freq: Once | INTRAVENOUS | Status: AC
  Administered 2015-07-13: 1000 mg via INTRAVENOUS
  Filled 2015-07-13: qty 100

## 2015-07-13 MED ORDER — PROPOFOL 500 MG/50ML IV EMUL
INTRAVENOUS | Status: DC | PRN
  Administered 2015-07-13: 25 ug/kg/min via INTRAVENOUS

## 2015-07-13 MED ORDER — BISACODYL 10 MG RE SUPP: 10.0000 mg | Freq: Every day | RECTAL | Status: DC | PRN

## 2015-07-13 MED ORDER — SODIUM CHLORIDE 0.9 % IJ SOLN
INTRAMUSCULAR | Status: DC | PRN
  Administered 2015-07-13: 30 mL

## 2015-07-13 MED ORDER — MIDAZOLAM HCL 2 MG/2ML IJ SOLN
INTRAMUSCULAR | Status: AC
  Filled 2015-07-13: qty 2

## 2015-07-13 MED ORDER — INSULIN ASPART 100 UNIT/ML ~~LOC~~ SOLN
0.0000 [IU] | Freq: Three times a day (TID) | SUBCUTANEOUS | Status: DC
  Administered 2015-07-13: 2 [IU] via SUBCUTANEOUS
  Administered 2015-07-14: 5 [IU] via SUBCUTANEOUS
  Administered 2015-07-14 (×2): 3 [IU] via SUBCUTANEOUS

## 2015-07-13 MED ORDER — TRAMADOL HCL 50 MG PO TABS
50.0000 mg | ORAL_TABLET | Freq: Four times a day (QID) | ORAL | Status: DC | PRN
  Administered 2015-07-14 – 2015-07-15 (×2): 100 mg via ORAL
  Filled 2015-07-13 (×2): qty 2

## 2015-07-13 MED ORDER — MENTHOL 3 MG MT LOZG: 1.0000 | LOZENGE | OROMUCOSAL | Status: DC | PRN

## 2015-07-13 MED ORDER — RIVAROXABAN 10 MG PO TABS
10.0000 mg | ORAL_TABLET | Freq: Every day | ORAL | Status: DC
  Administered 2015-07-14 – 2015-07-16 (×3): 10 mg via ORAL
  Filled 2015-07-13 (×4): qty 1

## 2015-07-13 MED ORDER — BUPIVACAINE HCL 0.25 % IJ SOLN
INTRAMUSCULAR | Status: DC | PRN
  Administered 2015-07-13: 30 mL

## 2015-07-13 MED ORDER — TRANEXAMIC ACID 1000 MG/10ML IV SOLN
2000.0000 mg | Freq: Once | INTRAVENOUS | Status: DC
  Filled 2015-07-13: qty 20

## 2015-07-13 MED ORDER — POLYETHYLENE GLYCOL 3350 17 G PO PACK: 17.0000 g | PACK | Freq: Every day | ORAL | Status: DC | PRN

## 2015-07-13 MED ORDER — CEFAZOLIN SODIUM-DEXTROSE 2-3 GM-% IV SOLR
2.0000 g | Freq: Four times a day (QID) | INTRAVENOUS | Status: AC
  Administered 2015-07-13 (×2): 2 g via INTRAVENOUS
  Filled 2015-07-13 (×2): qty 50

## 2015-07-13 MED ORDER — LACTATED RINGERS IV SOLN
INTRAVENOUS | Status: DC | PRN
  Administered 2015-07-13 (×3): via INTRAVENOUS

## 2015-07-13 MED ORDER — DIPHENHYDRAMINE HCL 12.5 MG/5ML PO ELIX: 12.5000 mg | ORAL_SOLUTION | ORAL | Status: DC | PRN

## 2015-07-13 MED ORDER — ONDANSETRON HCL 4 MG/2ML IJ SOLN
INTRAMUSCULAR | Status: AC
  Filled 2015-07-13: qty 2

## 2015-07-13 MED ORDER — CEFAZOLIN SODIUM-DEXTROSE 2-3 GM-% IV SOLR
INTRAVENOUS | Status: AC
  Filled 2015-07-13: qty 50

## 2015-07-13 MED ORDER — ONDANSETRON HCL 4 MG/2ML IJ SOLN: 4.0000 mg | Freq: Four times a day (QID) | INTRAMUSCULAR | Status: DC | PRN

## 2015-07-13 MED ORDER — SODIUM CHLORIDE 0.9 % IV SOLN: INTRAVENOUS | Status: DC

## 2015-07-13 MED ORDER — FENTANYL CITRATE (PF) 100 MCG/2ML IJ SOLN
INTRAMUSCULAR | Status: AC
  Filled 2015-07-13: qty 2

## 2015-07-13 MED ORDER — BUPIVACAINE IN DEXTROSE 0.75-8.25 % IT SOLN
INTRATHECAL | Status: DC | PRN
  Administered 2015-07-13: 2 mL via INTRATHECAL

## 2015-07-13 MED ORDER — SODIUM CHLORIDE 0.9 % IV SOLN
INTRAVENOUS | Status: DC
  Administered 2015-07-13 – 2015-07-14 (×2): via INTRAVENOUS

## 2015-07-13 MED ORDER — MORPHINE SULFATE (PF) 2 MG/ML IV SOLN
1.0000 mg | INTRAVENOUS | Status: DC | PRN
  Administered 2015-07-13 (×3): 1 mg via INTRAVENOUS
  Filled 2015-07-13 (×3): qty 1

## 2015-07-13 MED ORDER — ONDANSETRON HCL 4 MG PO TABS: 4.0000 mg | ORAL_TABLET | Freq: Four times a day (QID) | ORAL | Status: DC | PRN

## 2015-07-13 MED ORDER — SODIUM CHLORIDE 0.9 % IR SOLN
Status: DC | PRN
  Administered 2015-07-13: 1000 mL

## 2015-07-13 MED ORDER — LORATADINE 10 MG PO TABS
10.0000 mg | ORAL_TABLET | Freq: Every day | ORAL | Status: DC
  Administered 2015-07-14 – 2015-07-16 (×3): 10 mg via ORAL
  Filled 2015-07-13 (×3): qty 1

## 2015-07-13 SURGICAL SUPPLY — 50 items
BAG DECANTER FOR FLEXI CONT (MISCELLANEOUS) ×2 IMPLANT
BAG SPEC THK2 15X12 ZIP CLS (MISCELLANEOUS) ×1
BAG ZIPLOCK 12X15 (MISCELLANEOUS) ×2 IMPLANT
BANDAGE ACE 6X5 VEL STRL LF (GAUZE/BANDAGES/DRESSINGS) ×2 IMPLANT
BLADE SAG 18X100X1.27 (BLADE) ×2 IMPLANT
BLADE SAW SGTL 11.0X1.19X90.0M (BLADE) ×2 IMPLANT
BOWL SMART MIX CTS (DISPOSABLE) ×2 IMPLANT
CAP KNEE TOTAL 3 SIGMA ×1 IMPLANT
CEMENT HV SMART SET (Cement) ×4 IMPLANT
CLOTH BEACON ORANGE TIMEOUT ST (SAFETY) ×2 IMPLANT
CUFF TOURN SGL QUICK 34 (TOURNIQUET CUFF) ×2
CUFF TRNQT CYL 34X4X40X1 (TOURNIQUET CUFF) ×1 IMPLANT
DECANTER SPIKE VIAL GLASS SM (MISCELLANEOUS) ×2 IMPLANT
DRAPE U-SHAPE 47X51 STRL (DRAPES) ×3 IMPLANT
DRSG ADAPTIC 3X8 NADH LF (GAUZE/BANDAGES/DRESSINGS) ×2 IMPLANT
DRSG PAD ABDOMINAL 8X10 ST (GAUZE/BANDAGES/DRESSINGS) ×2 IMPLANT
DURAPREP 26ML APPLICATOR (WOUND CARE) ×2 IMPLANT
ELECT REM PT RETURN 9FT ADLT (ELECTROSURGICAL) ×2
ELECTRODE REM PT RTRN 9FT ADLT (ELECTROSURGICAL) ×1 IMPLANT
EVACUATOR 1/8 PVC DRAIN (DRAIN) ×2 IMPLANT
GAUZE SPONGE 4X4 12PLY STRL (GAUZE/BANDAGES/DRESSINGS) ×2 IMPLANT
GLOVE BIO SURGEON STRL SZ7.5 (GLOVE) IMPLANT
GLOVE BIO SURGEON STRL SZ8 (GLOVE) ×2 IMPLANT
GLOVE BIOGEL PI IND STRL 6.5 (GLOVE) IMPLANT
GLOVE BIOGEL PI IND STRL 8 (GLOVE) ×1 IMPLANT
GLOVE BIOGEL PI INDICATOR 6.5 (GLOVE)
GLOVE BIOGEL PI INDICATOR 8 (GLOVE) ×1
GLOVE SURG SS PI 6.5 STRL IVOR (GLOVE) IMPLANT
GOWN STRL REUS W/TWL LRG LVL3 (GOWN DISPOSABLE) ×2 IMPLANT
GOWN STRL REUS W/TWL XL LVL3 (GOWN DISPOSABLE) IMPLANT
HANDPIECE INTERPULSE COAX TIP (DISPOSABLE) ×2
IMMOBILIZER KNEE 20 (SOFTGOODS) ×2
IMMOBILIZER KNEE 20 THIGH 36 (SOFTGOODS) ×1 IMPLANT
MANIFOLD NEPTUNE II (INSTRUMENTS) ×2 IMPLANT
NS IRRIG 1000ML POUR BTL (IV SOLUTION) ×2 IMPLANT
PACK TOTAL KNEE CUSTOM (KITS) ×2 IMPLANT
PADDING CAST COTTON 6X4 STRL (CAST SUPPLIES) ×5 IMPLANT
POSITIONER SURGICAL ARM (MISCELLANEOUS) ×2 IMPLANT
SET HNDPC FAN SPRY TIP SCT (DISPOSABLE) ×1 IMPLANT
STRIP CLOSURE SKIN 1/2X4 (GAUZE/BANDAGES/DRESSINGS) ×4 IMPLANT
SUT MNCRL AB 4-0 PS2 18 (SUTURE) ×2 IMPLANT
SUT VIC AB 2-0 CT1 27 (SUTURE) ×6
SUT VIC AB 2-0 CT1 TAPERPNT 27 (SUTURE) ×3 IMPLANT
SUT VLOC 180 0 24IN GS25 (SUTURE) ×2 IMPLANT
SYR 50ML LL SCALE MARK (SYRINGE) ×2 IMPLANT
TRAY FOLEY W/METER SILVER 14FR (SET/KITS/TRAYS/PACK) ×1 IMPLANT
TRAY FOLEY W/METER SILVER 16FR (SET/KITS/TRAYS/PACK) ×2 IMPLANT
WATER STERILE IRR 1500ML POUR (IV SOLUTION) ×2 IMPLANT
WRAP KNEE MAXI GEL POST OP (GAUZE/BANDAGES/DRESSINGS) ×2 IMPLANT
YANKAUER SUCT BULB TIP 10FT TU (MISCELLANEOUS) ×2 IMPLANT

## 2015-07-13 NOTE — Anesthesia Procedure Notes (Signed)
Spinal Patient location during procedure: OR Staffing Anesthesiologist: Lauretta Grill Resident/CRNA: Darlys Gales R Performed by: anesthesiologist and resident/CRNA  Preanesthetic Checklist Completed: patient identified, site marked, surgical consent, pre-op evaluation, timeout performed, IV checked, risks and benefits discussed and monitors and equipment checked Spinal Block Patient position: sitting Prep: ChloraPrep Patient monitoring: heart rate, continuous pulse ox and blood pressure Location: L3-4 Injection technique: single-shot Needle Needle type: Quincke  Needle gauge: 22 G Needle length: 9 cm Additional Notes Expiration date of kit checked and confirmed. Patient tolerated procedure well, without complications.  Functioning IV was confirmed and monitors were applied. Sterile prep and drape, including hand hygiene, mask and sterile gloves were used. The patient was positioned and the spine was prepped. The skin was anesthetized with lidocaine.  Free flow of clear CSF was obtained prior to injecting local anesthetic into the CSF.  The spinal needle aspirated freely following injection.  The needle was carefully withdrawn.  The patient tolerated the procedure well. Consent was obtained prior to procedure with all questions answered and concerns addressed. Risks including but not limited to bleeding, infection, nerve damage, paralysis, failed block, inadequate analgesia, allergic reaction, high spinal, itching and headache were discussed and the patient wished to proceed. CRNA attempted spinal twice and os encountered with no CSF. MDA took over and easily placed on first attempt, no paresthesia or heme and CSF return. -MJUDD  Lauretta Grill, MD

## 2015-07-13 NOTE — Op Note (Signed)
Pre-operative diagnosis- Osteoarthritis  Left knee(s)  Post-operative diagnosis- Osteoarthritis Left knee(s)  Procedure-  Left  Total Knee Arthroplasty  Surgeon- Dione Plover. Donell Sliwinski, MD  Assistant- Arlee Muslim, PA-C   Anesthesia-  Spinal  EBL-* No blood loss amount entered *   Drains Hemovac  Tourniquet time-  Total Tourniquet Time Documented: Thigh (Left) - 32 minutes Total: Thigh (Left) - 32 minutes     Complications- None  Condition-PACU - hemodynamically stable.   Brief Clinical Note   Andrew Cobb is a 65 y.o. year old male with end stage OA of his left knee with progressively worsening pain and dysfunction. He has constant pain, with activity and at rest and significant functional deficits with difficulties even with ADLs. He has had extensive non-op management including analgesics, injections of cortisone and viscosupplements, and home exercise program, but remains in significant pain with significant dysfunction. Radiographs show bone on bone arthritis medial and patellofemoral. He presents now for left Total Knee Arthroplasty.     Procedure in detail---   The patient is brought into the operating room and positioned supine on the operating table. After successful administration of  Spinal,   a tourniquet is placed high on the  Left thigh(s) and the lower extremity is prepped and draped in the usual sterile fashion. Time out is performed by the operating team and then the  Left lower extremity is wrapped in Esmarch, knee flexed and the tourniquet inflated to 300 mmHg.       A midline incision is made with a ten blade through the subcutaneous tissue to the level of the extensor mechanism. A fresh blade is used to make a medial parapatellar arthrotomy. Soft tissue over the proximal medial tibia is subperiosteally elevated to the joint line with a knife and into the semimembranosus bursa with a Cobb elevator. Soft tissue over the proximal lateral tibia is elevated with  attention being paid to avoiding the patellar tendon on the tibial tubercle. The patella is everted, knee flexed 90 degrees and the ACL and PCL are removed. Findings are exposed bone all 3 compartments with large global osteophytes.        The drill is used to create a starting hole in the distal femur and the canal is thoroughly irrigated with sterile saline to remove the fatty contents. The 5 degree Left  valgus alignment guide is placed into the femoral canal and the distal femoral cutting block is pinned to remove 10 mm off the distal femur. Resection is made with an oscillating saw.      The tibia is subluxed forward and the menisci are removed. The extramedullary alignment guide is placed referencing proximally at the medial aspect of the tibial tubercle and distally along the second metatarsal axis and tibial crest. The block is pinned to remove 75mm off the more deficient medial  side. Resection is made with an oscillating saw. Size 4is the most appropriate size for the tibia and the proximal tibia is prepared with the modular drill and keel punch for that size.      The femoral sizing guide is placed and size 4 is most appropriate. Rotation is marked off the epicondylar axis and confirmed by creating a rectangular flexion gap at 90 degrees. The size 4 cutting block is pinned in this rotation and the anterior, posterior and chamfer cuts are made with the oscillating saw. The intercondylar block is then placed and that cut is made.      Trial size 4 tibial  component, trial size 4 posterior stabilized femur and a 12.5  mm posterior stabilized rotating platform insert trial is placed. Full extension is achieved with excellent varus/valgus and anterior/posterior balance throughout full range of motion. The patella is everted and thickness measured to be 24  mm. Free hand resection is taken to 14 mm, a 38 template is placed, lug holes are drilled, trial patella is placed, and it tracks normally. Osteophytes  are removed off the posterior femur with the trial in place. All trials are removed and the cut bone surfaces prepared with pulsatile lavage. Cement is mixed and once ready for implantation, the size 4 tibial implant, size  4 posterior stabilized femoral component, and the size 38 patella are cemented in place and the patella is held with the clamp. The trial insert is placed and the knee held in full extension. The Exparel (20 ml mixed with 30 ml saline) and .25% Bupivicaine, are injected into the extensor mechanism, posterior capsule, medial and lateral gutters and subcutaneous tissues.  All extruded cement is removed and once the cement is hard the permanent 12.5 mm posterior stabilized rotating platform insert is placed into the tibial tray.      The wound is copiously irrigated with saline solution and the extensor mechanism closed over a hemovac drain with #1 V-loc suture. The tourniquet is released for a total tourniquet time of 32  minutes. Flexion against gravity is 140 degrees and the patella tracks normally. Subcutaneous tissue is closed with 2.0 vicryl and subcuticular with running 4.0 Monocryl. The incision is cleaned and dried and steri-strips and a bulky sterile dressing are applied. The limb is placed into a knee immobilizer and the patient is awakened and transported to recovery in stable condition.      Please note that a surgical assistant was a medical necessity for this procedure in order to perform it in a safe and expeditious manner. Surgical assistant was necessary to retract the ligaments and vital neurovascular structures to prevent injury to them and also necessary for proper positioning of the limb to allow for anatomic placement of the prosthesis.   [name], MD    07/13/2015, 10:13 AM

## 2015-07-13 NOTE — Anesthesia Postprocedure Evaluation (Signed)
Anesthesia Post Note  Patient: Andrew Cobb  Procedure(s) Performed: Procedure(s) (LRB): LEFT TOTAL KNEE ARTHROPLASTY (Left)  Patient location during evaluation: PACU Anesthesia Type: Spinal Level of consciousness: awake and alert Pain management: pain level controlled Vital Signs Assessment: post-procedure vital signs reviewed and stable Respiratory status: spontaneous breathing, nonlabored ventilation, respiratory function stable and patient connected to nasal cannula oxygen Cardiovascular status: blood pressure returned to baseline and stable Postop Assessment: no signs of nausea or vomiting Anesthetic complications: no    Last Vitals:  Filed Vitals:   07/13/15 1150 07/13/15 1300  BP: 132/83 137/91  Pulse: 46 63  Temp: 36.3 C 36.3 C  Resp: 14 15    Last Pain:  Filed Vitals:   07/13/15 1320  PainSc: 1                  Yanely Mast JENNETTE

## 2015-07-13 NOTE — Anesthesia Preprocedure Evaluation (Addendum)
Anesthesia Evaluation  Patient identified by MRN, date of birth, ID band Patient awake    Reviewed: Allergy & Precautions, NPO status , Patient's Chart, lab work & pertinent test results  History of Anesthesia Complications Negative for: history of anesthetic complications  Airway Mallampati: II  TM Distance: >3 FB Neck ROM: Full    Dental no notable dental hx. (+) Dental Advisory Given   Pulmonary asthma , sleep apnea , former smoker,    Pulmonary exam normal breath sounds clear to auscultation       Cardiovascular hypertension, Pt. on medications + CAD and + Cardiac Stents (1998 2 stents palced)  Normal cardiovascular exam Rhythm:Regular Rate:Normal  Seen by cardiology and cleared for surgery   Neuro/Psych negative neurological ROS  negative psych ROS   GI/Hepatic Neg liver ROS, GERD  Medicated and Controlled,  Endo/Other  diabetes, Type 2, Oral Hypoglycemic Agents  Renal/GU negative Renal ROS  negative genitourinary   Musculoskeletal  (+) Arthritis ,   Abdominal   Peds negative pediatric ROS (+)  Hematology negative hematology ROS (+)   Anesthesia Other Findings   Reproductive/Obstetrics negative OB ROS                            Anesthesia Physical Anesthesia Plan  ASA: II  Anesthesia Plan: Spinal   Post-op Pain Management:    Induction:   Airway Management Planned:   Additional Equipment:   Intra-op Plan:   Post-operative Plan:   Informed Consent: I have reviewed the patients History and Physical, chart, labs and discussed the procedure including the risks, benefits and alternatives for the proposed anesthesia with the patient or authorized representative who has indicated his/her understanding and acceptance.   Dental advisory given  Plan Discussed with: CRNA  Anesthesia Plan Comments:         Anesthesia Quick Evaluation

## 2015-07-13 NOTE — Evaluation (Signed)
Physical Therapy Evaluation Patient Details Name: Andrew Cobb MRN: [id number] DOB: [birth year] Today's Date: 07/13/2015   History of Present Illness  L TKR; s/p R TKR (07), R TSR (07) and gastric bypass  Clinical Impression  Pt s/p L TKR presents with decreased L LE strength/ROM and post op pain limiting functional mobility.  Pt should progress to dc home with family assist and HHPT follow up.    Follow Up Recommendations Home health PT    Equipment Recommendations  None recommended by PT    Recommendations for Other Services OT consult     Precautions / Restrictions Precautions Precautions: Knee;Fall Required Braces or Orthoses: Knee Immobilizer - Left Knee Immobilizer - Left: Discontinue once straight leg raise with < 10 degree lag Restrictions Weight Bearing Restrictions: No Other Position/Activity Restrictions: WBAT      Mobility  Bed Mobility Overal bed mobility: Needs Assistance Bed Mobility: Supine to Sit     Supine to sit: Mod assist;Min assist     General bed mobility comments: cues for sequence and use of R LE to self assist  Transfers Overall transfer level: Needs assistance Equipment used: Rolling walker (2 wheeled) Transfers: Sit to/from Stand Sit to Stand: Min assist;Mod assist         General transfer comment: cues for LE management and use of UEs to self assist  Ambulation/Gait Ambulation/Gait assistance: Min assist;Mod assist Ambulation Distance (Feet): 35 Feet Assistive device: Rolling walker (2 wheeled) Gait Pattern/deviations: Step-to pattern;Decreased step length - right;Decreased step length - left;Shuffle;Trunk flexed Gait velocity: decr   General Gait Details: cues for sequence, posture and position from [employer]            Wheelchair Mobility    Modified [name] (Stroke Patients Only)       Balance                                             Pertinent Vitals/Pain Pain Assessment: 0-10 Pain  Score: 3  Pain Location: L knee Pain Descriptors / Indicators: Aching;Sore Pain Intervention(s): Limited activity within patient's tolerance;Monitored during session;Premedicated before session;Ice applied    Home Living Family/patient expects to be discharged to:: Private residence Living Arrangements: Spouse/significant other Available Help at Discharge: Family Type of Home: House Home Access: Stairs to enter   Technical brewer of Steps: [place]: Environmental consultant - 2 wheels;Cane - single point;Crutches;Bedside commode      Prior Function Level of Independence: Independent               Hand Dominance   Dominant Hand: Right    Extremity/Trunk Assessment   Upper Extremity Assessment: Overall WFL for tasks assessed           Lower Extremity Assessment: LLE deficits/detail   LLE Deficits / Details: 2+/5 quads  Cervical / Trunk Assessment: Normal  Communication   Communication: No difficulties  Cognition Arousal/Alertness: Awake/alert Behavior During Therapy: WFL for tasks assessed/performed Overall Cognitive Status: Within Functional Limits for tasks assessed                      General Comments      Exercises Total Joint Exercises Ankle Circles/Pumps: AROM;Both;15 reps;Supine      Assessment/Plan    PT Assessment Patient needs continued PT services  PT Diagnosis Difficulty walking   PT Problem List Decreased  strength;Decreased range of motion;Decreased activity tolerance;Decreased mobility;Decreased knowledge of use of DME;Pain  PT Treatment Interventions DME instruction;Gait training;Stair training;Functional mobility training;Therapeutic activities;Therapeutic exercise;Patient/family education   PT Goals (Current goals can be found in the Care Plan section) Acute Rehab PT Goals Patient Stated Goal: Resume previous lifestyle with decreased pain PT Goal Formulation: With patient Time For Goal Achievement: 07/18/15 Potential to  Achieve Goals: Good    Frequency 7X/week   Barriers to discharge        Co-evaluation               End of Session Equipment Utilized During Treatment: Gait belt;Left knee immobilizer Activity Tolerance: Patient tolerated treatment well Patient left: in chair;with call bell/phone within reach Nurse Communication: Mobility status         Time: [id number] PT Time Calculation (min) (ACUTE ONLY): 29 min   Charges:   PT Evaluation $PT Eval Low Complexity: 1 Procedure PT Treatments $Gait Training: 8-22 mins   PT G Codes:        Lucresia Simic 2015/07/18, 5:03 PM

## 2015-07-13 NOTE — Discharge Instructions (Addendum)
Dr. Gaynelle Arabian Total Joint Specialist [facility] [address], [place], [place] [address] [phone]  TOTAL KNEE REPLACEMENT POSTOPERATIVE DIRECTIONS  Knee Rehabilitation, Guidelines Following Surgery  Results after knee surgery are often greatly improved when you follow the exercise, range of motion and muscle strengthening exercises prescribed by your doctor. Safety measures are also important to protect the knee from further injury. Any time any of these exercises cause you to have increased pain or swelling in your knee joint, decrease the amount until you are comfortable again and slowly increase them. If you have problems or questions, call your caregiver or physical therapist for advice.   HOME CARE INSTRUCTIONS  Remove items at home which could result in a fall. This includes throw rugs or furniture in walking pathways.   ICE to the affected knee every three hours for 30 minutes at a time and then as needed for pain and swelling.  Continue to use ice on the knee for pain and swelling from surgery. You may notice swelling that will progress down to the foot and ankle.  This is normal after surgery.  Elevate the leg when you are not up walking on it.    Continue to use the breathing machine which will help keep your temperature down.  It is common for your temperature to cycle up and down following surgery, especially at night when you are not up moving around and exerting yourself.  The breathing machine keeps your lungs expanded and your temperature down.  Do not place pillow under knee, focus on keeping the knee straight while resting  DIET You may resume your previous home diet once your are discharged from the hospital.  DRESSING / WOUND CARE / SHOWERING You may start showering once you are discharged home but do not submerge the incision under water. Just pat the incision dry and apply a dry gauze dressing on daily. Change the surgical dressing  daily and reapply a dry dressing each time.  ACTIVITY Walk with your walker as instructed. Use walker as long as suggested by your caregivers. Avoid periods of inactivity such as sitting longer than an hour when not asleep. This helps prevent blood clots.  You may resume a sexual relationship in one month or when given the OK by your doctor.  You may return to work once you are cleared by your doctor.  Do not drive a car for 6 weeks or until released by you surgeon.  Do not drive while taking narcotics.  WEIGHT BEARING AS TOLERATED  POSTOPERATIVE CONSTIPATION PROTOCOL Constipation - defined medically as fewer than three stools per week and severe constipation as less than one stool per week.  One of the most common issues patients have following surgery is constipation.  Even if you have a regular bowel pattern at home, your normal regimen is likely to be disrupted due to multiple reasons following surgery.  Combination of anesthesia, postoperative narcotics, change in appetite and fluid intake all can affect your bowels.  In order to avoid complications following surgery, here are some recommendations in order to help you during your recovery period.  Colace (docusate) - Pick up an over-the-counter form of Colace or another stool softener and take twice a day as long as you are requiring postoperative pain medications.  Take with a full glass of water daily.  If you experience loose stools or diarrhea, hold the colace until you stool forms back up.  If your symptoms do not get better  within 1 week or if they get worse, check with your doctor.  Dulcolax (bisacodyl) - Pick up over-the-counter and take as directed by the product packaging as needed to assist with the movement of your bowels.  Take with a full glass of water.  Use this product as needed if not relieved by Colace only.   MiraLax (polyethylene glycol) - Pick up over-the-counter to have on hand.  MiraLax is a solution that will  increase the amount of water in your bowels to assist with bowel movements.  Take as directed and can mix with a glass of water, juice, soda, coffee, or tea.  Take if you go more than two days without a movement. Do not use MiraLax more than once per day. Call your doctor if you are still constipated or irregular after using this medication for 7 days in a row.  If you continue to have problems with postoperative constipation, please contact the office for further assistance and recommendations.  If you experience "the worst abdominal pain ever" or develop nausea or vomiting, please contact the office immediatly for further recommendations for treatment.  ITCHING  If you experience itching with your medications, try taking only a single pain pill, or even half a pain pill at a time.  You can also use Benadryl over the counter for itching or also to help with sleep.   TED HOSE STOCKINGS Wear the elastic stockings on both legs for three weeks following surgery during the day but you may remove then at night for sleeping.  MEDICATIONS See your medication summary on the After Visit Summary that the nursing staff will review with you prior to discharge.  You may have some home medications which will be placed on hold until you complete the course of blood thinner medication.  It is important for you to complete the blood thinner medication as prescribed by your surgeon.  Continue your approved medications as instructed at time of discharge.  PRECAUTIONS If you experience chest pain or shortness of breath - call 911 immediately for transfer to the hospital emergency department.  If you develop a fever greater that 101 F, purulent drainage from wound, increased redness or drainage from wound, foul odor from the wound/dressing, or calf pain - CONTACT YOUR SURGEON.                                                   FOLLOW-UP APPOINTMENTS Make sure you keep all of your appointments after your operation  with your surgeon and caregivers. You should call the office at the above phone number and make an appointment for approximately two weeks after the date of your surgery or on the date instructed by your surgeon outlined in the "After Visit Summary".   RANGE OF MOTION AND STRENGTHENING EXERCISES  Rehabilitation of the knee is important following a knee injury or an operation. After just a few days of immobilization, the muscles of the thigh which control the knee become weakened and shrink (atrophy). Knee exercises are designed to build up the tone and strength of the thigh muscles and to improve knee motion. Often times heat used for twenty to thirty minutes before working out will loosen up your tissues and help with improving the range of motion but do not use heat for the first two weeks following surgery. These exercises can  be done on a training (exercise) mat, on the floor, on a table or on a bed. Use what ever works the best and is most comfortable for you Knee exercises include:  Leg Lifts - While your knee is still immobilized in a splint or cast, you can do straight leg raises. Lift the leg to 60 degrees, hold for 3 sec, and slowly lower the leg. Repeat 10-20 times 2-3 times daily. Perform this exercise against resistance later as your knee gets better.  Quad and Hamstring Sets - Tighten up the muscle on the front of the thigh (Quad) and hold for 5-10 sec. Repeat this 10-20 times hourly. Hamstring sets are done by pushing the foot backward against an object and holding for 5-10 sec. Repeat as with quad sets.   Leg Slides: Lying on your back, slowly slide your foot toward your buttocks, bending your knee up off the floor (only go as far as is comfortable). Then slowly slide your foot back down until your leg is flat on the floor again.  Angel Wings: Lying on your back spread your legs to the side as far apart as you can without causing discomfort.  A rehabilitation program following serious knee  injuries can speed recovery and prevent re-injury in the future due to weakened muscles. Contact your doctor or a physical therapist for more information on knee rehabilitation.   IF YOU ARE TRANSFERRED TO A SKILLED REHAB FACILITY If the patient is transferred to a skilled rehab facility following release from the hospital, a list of the current medications will be sent to the facility for the patient to continue.  When discharged from the skilled rehab facility, please have the facility set up the patient's [place] prior to being released. Also, the skilled facility will be responsible for providing the patient with their medications at time of release from the facility to include their pain medication, the muscle relaxants, and their blood thinner medication. If the patient is still at the rehab facility at time of the two week follow up appointment, the skilled rehab facility will also need to assist the patient in arranging follow up appointment in our office and any transportation needs.  MAKE SURE YOU:  Understand these instructions.  Get help right away if you are not doing well or get worse.    Pick up stool softner and laxative for home use following surgery while on pain medications. Do not submerge incision under water. Please use good hand washing techniques while changing dressing each day. May shower starting three days after surgery. Please use a clean towel to pat the incision dry following showers. Continue to use ice for pain and swelling after surgery. Do not use any lotions or creams on the incision until instructed by your surgeon.  Take Xarelto for two and a half more weeks, then discontinue Xarelto. Once the patient has completed the blood thinner regimen, then take a Baby 81 mg Aspirin daily for three more weeks.   Information on my medicine - XARELTO (Rivaroxaban)  This medication education was reviewed with me or my healthcare representative as  part of my discharge preparation.  The pharmacist that spoke with me during my hospital stay was:  Absher, Julieta Bellini, RPH  Why was Xarelto prescribed for you? Xarelto was prescribed for you to reduce the risk of blood clots forming after orthopedic surgery. The medical term for these abnormal blood clots is venous thromboembolism (VTE).  What do you need to know  about xarelto ? Take your Xarelto ONCE DAILY at the same time every day. You may take it either with or without food.  If you have difficulty swallowing the tablet whole, you may crush it and mix in applesauce just prior to taking your dose.  Take Xarelto exactly as prescribed by your doctor and DO NOT stop taking Xarelto without talking to the doctor who prescribed the medication.  Stopping without other VTE prevention medication to take the place of Xarelto may increase your risk of developing a clot.  After discharge, you should have regular check-up appointments with your healthcare provider that is prescribing your Xarelto.    What do you do if you miss a dose? If you miss a dose, take it as soon as you remember on the same day then continue your regularly scheduled once daily regimen the next day. Do not take two doses of Xarelto on the same day.   Important Safety Information A possible side effect of Xarelto is bleeding. You should call your healthcare provider right away if you experience any of the following: ? Bleeding from an injury or your nose that does not stop. ? Unusual colored urine (red or dark brown) or unusual colored stools (red or black). ? Unusual bruising for unknown reasons. ? A serious fall or if you hit your head (even if there is no bleeding).  Some medicines may interact with Xarelto and might increase your risk of bleeding while on Xarelto. To help avoid this, consult your healthcare provider or pharmacist prior to using any new prescription or non-prescription medications, including  herbals, vitamins, non-steroidal anti-inflammatory drugs (NSAIDs) and supplements.  This website has more information on Xarelto: www.xarelto.com

## 2015-07-13 NOTE — Interval H&P Note (Signed)
History and Physical Interval Note:  07/13/2015 8:27 AM  Andrew Cobb  has presented today for surgery, with the diagnosis of left knee osteoarthritis  The various methods of treatment have been discussed with the patient and family. After consideration of risks, benefits and other options for treatment, the patient has consented to  Procedure(s): LEFT TOTAL KNEE ARTHROPLASTY (Left) as a surgical intervention .  The patient's history has been reviewed, patient examined, no change in status, stable for surgery.  I have reviewed the patient's chart and labs.  Questions were answered to the patient's satisfaction.     Gearlean Alf

## 2015-07-13 NOTE — Transfer of Care (Signed)
Immediate Anesthesia Transfer of Care Note  Patient: Andrew Cobb  Procedure(s) Performed: Procedure(s): LEFT TOTAL KNEE ARTHROPLASTY (Left)  Patient Location: PACU  Anesthesia Type:Spinal  Level of Consciousness:  sedated, patient cooperative and responds to stimulation  Airway & Oxygen Therapy:Patient Spontanous Breathing and Patient connected to face mask oxgen  Post-op Assessment:  Report given to PACU RN and Post -op Vital signs reviewed and stable  Post vital signs:  Reviewed and stable  Last Vitals:  Filed Vitals:   07/13/15 0701  BP: 127/82  Pulse: 55  Temp: 36.3 C  Resp: 18    Complications: No apparent anesthesia complications

## 2015-07-13 NOTE — Progress Notes (Signed)
Utilization review completed.  

## 2015-07-14 LAB — CBC
HEMATOCRIT: 37.7 % — AB (ref 39.0–52.0)
Hemoglobin: 12.4 g/dL — ABNORMAL LOW (ref 13.0–17.0)
MCH: 31.3 pg (ref 26.0–34.0)
MCHC: 32.9 g/dL (ref 30.0–36.0)
MCV: 95.2 fL (ref 78.0–100.0)
Platelets: 155 10*3/uL (ref 150–400)
RBC: 3.96 MIL/uL — ABNORMAL LOW (ref 4.22–5.81)
RDW: 12.9 % (ref 11.5–15.5)
WBC: 10.1 10*3/uL (ref 4.0–10.5)

## 2015-07-14 LAB — BASIC METABOLIC PANEL
Anion gap: 8 (ref 5–15)
BUN: 15 mg/dL (ref 6–20)
CALCIUM: 8.6 mg/dL — AB (ref 8.9–10.3)
CO2: 25 mmol/L (ref 22–32)
CREATININE: 1.02 mg/dL (ref 0.61–1.24)
Chloride: 105 mmol/L (ref 101–111)
GFR calc Af Amer: >60 mL/min (ref >60)
GFR calc non Af Amer: >60 mL/min (ref >60)
GLUCOSE: 124 mg/dL — AB (ref 65–99)
Potassium: 4.2 mmol/L (ref 3.5–5.1)
Sodium: 138 mmol/L (ref 135–145)

## 2015-07-14 LAB — GLUCOSE, CAPILLARY
Glucose-Capillary: 197 mg/dL — ABNORMAL HIGH (ref 65–99)
Glucose-Capillary: 190 mg/dL — ABNORMAL HIGH (ref 65–99)
Glucose-Capillary: 159 mg/dL — ABNORMAL HIGH (ref 65–99)
Glucose-Capillary: 211 mg/dL — ABNORMAL HIGH (ref 65–99)
Glucose-Capillary: 232 mg/dL — ABNORMAL HIGH (ref 65–99)

## 2015-07-14 MED ORDER — TRAMADOL HCL 50 MG PO TABS: 50.0000 mg | ORAL_TABLET | Freq: Four times a day (QID) | ORAL | Status: DC | PRN

## 2015-07-14 MED ORDER — METHOCARBAMOL 500 MG PO TABS: 500.0000 mg | ORAL_TABLET | Freq: Four times a day (QID) | ORAL | Status: DC | PRN

## 2015-07-14 MED ORDER — RIVAROXABAN 10 MG PO TABS: 10.0000 mg | ORAL_TABLET | Freq: Every day | ORAL | Status: DC

## 2015-07-14 MED ORDER — OXYCODONE HCL 5 MG PO TABS: 5.0000 mg | ORAL_TABLET | ORAL | Status: DC | PRN

## 2015-07-14 MED ORDER — FLUTICASONE PROPIONATE 50 MCG/ACT NA SUSP: 2.0000 | Freq: Every day | NASAL | Status: DC | PRN

## 2015-07-14 NOTE — Care Management Note (Signed)
Case Management Note  Patient Details  Name: Andrew Cobb MRN: [id number] Date of Birth: [birth year]  Subjective/Objective:     PT recommends home therapy, discussed with pt.  He states he had services from [facility] previously and would like referral to that agency.              Expected Discharge Date:  07/15/15               Expected Discharge Plan:  [place]  Discharge planning Services  CM Consult  Post Acute Care Choice:  Home Health Choice offered to:  Patient  HH Arranged:  PT [place]:  [facility]  Status of Service:  Completed, signed off  Girard Cooter, [place] 07/14/2015, 11:17 AM

## 2015-07-14 NOTE — Progress Notes (Signed)
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    PT Comments    Pt very motivated and progressing well with mobility  Follow Up Recommendations  Home health PT     Equipment Recommendations  None recommended by PT    Recommendations for Other Services OT consult     Precautions / Restrictions Precautions Precautions: Knee;Fall Required Braces or Orthoses: Knee Immobilizer - Left Knee Immobilizer - Left: Discontinue once straight leg raise with < 10 degree lag Restrictions Weight Bearing Restrictions: No Other Position/Activity Restrictions: WBAT    Mobility  Bed Mobility Overal bed mobility: Needs Assistance Bed Mobility: Sit to Supine     Supine to sit: Supervision Sit to supine: Min guard   General bed mobility comments: pt managed RLE  Transfers Overall transfer level: Needs assistance Equipment used: Rolling walker (2 wheeled) Transfers: Sit to/from Stand Sit to Stand: Min guard         General transfer comment: cues for UE/LE placement, safety  Ambulation/Gait Ambulation/Gait assistance: Min guard Ambulation Distance (Feet): 141 Feet Assistive device: Rolling walker (2 wheeled) Gait Pattern/deviations: Step-to pattern;Step-through pattern;Decreased step length - right;Decreased step length - left;Shuffle;Trunk flexed Gait velocity: decr   General Gait Details: cues for sequence, posture and position from [employer]            Wheelchair Mobility    Modified [name] (Stroke Patients Only)       Balance                                    Cognition Arousal/Alertness: Awake/alert Behavior During Therapy: WFL for tasks assessed/performed Overall Cognitive Status: Within Functional Limits for tasks assessed                      Exercises      General  Comments        Pertinent Vitals/Pain Pain Assessment: 0-10 Pain Score: 4  Pain Location: L knee Pain Descriptors / Indicators: Aching;Sore Pain Intervention(s): Limited activity within patient's tolerance;Monitored during session;Premedicated before session;Ice applied    Home Living Family/patient expects to be discharged to:: Private residence Living Arrangements: Spouse/significant other Available Help at Discharge: Family Type of Home: House Home Access: Stairs to enter     Home Equipment: Environmental consultant - 2 wheels;Cane - single point;Crutches;Bedside commode      Prior Function Level of Independence: Independent          PT Goals (current goals can now be found in the care plan section) Acute Rehab PT Goals Patient Stated Goal: Resume previous lifestyle with decreased pain PT Goal Formulation: With patient Time For Goal Achievement: 07/18/15 Potential to Achieve Goals: Good Progress towards PT goals: Progressing toward goals    Frequency  7X/week    PT Plan Current plan remains appropriate    Co-evaluation             End of Session Equipment Utilized During Treatment: Gait belt;Left knee immobilizer Activity Tolerance: Patient tolerated treatment well Patient left: in bed;with call bell/phone within reach     Time: 1415-1431 PT Time Calculation (min) (ACUTE ONLY): 16 min  Charges:  $Gait Training: 8-22 mins  G Codes:      Xandrea Clarey 07-18-2015, 2:36 PM

## 2015-07-14 NOTE — Progress Notes (Signed)
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  Good progress with mobility.  Follow Up Recommendations  Home health PT     Equipment Recommendations  None recommended by PT    Recommendations for Other Services OT consult     Precautions / Restrictions Precautions Precautions: Knee;Fall Required Braces or Orthoses: Knee Immobilizer - Left Knee Immobilizer - Left: Discontinue once straight leg raise with < 10 degree lag Restrictions Weight Bearing Restrictions: No Other Position/Activity Restrictions: WBAT    Mobility  Bed Mobility Overal bed mobility: Needs Assistance Bed Mobility: Sit to Supine     Supine to sit: Supervision Sit to supine: Min assist   General bed mobility comments: pt managed RLE  Transfers Overall transfer level: Needs assistance Equipment used: Rolling walker (2 wheeled) Transfers: Sit to/from Stand Sit to Stand: Min guard         General transfer comment: cues for UE/LE placement, safety  Ambulation/Gait Ambulation/Gait assistance: Min assist Ambulation Distance (Feet): 111 Feet Assistive device: Rolling walker (2 wheeled) Gait Pattern/deviations: Step-to pattern;Decreased step length - right;Decreased step length - left;Shuffle;Trunk flexed Gait velocity: decr   General Gait Details: cues for sequence, posture and position from [employer]            Wheelchair Mobility    Modified [name] (Stroke Patients Only)       Balance                                    Cognition Arousal/Alertness: Awake/alert Behavior During Therapy: WFL for tasks assessed/performed Overall Cognitive Status: Within Functional Limits for tasks assessed                      Exercises Total Joint  Exercises Ankle Circles/Pumps: AROM;Both;15 reps;Supine Quad Sets: AROM;Both;10 reps;Supine Heel Slides: AAROM;Left;15 reps;Supine Straight Leg Raises: AAROM;AROM;Left;15 reps;Supine Goniometric ROM: AAROM at L knee -10- 70    General Comments        Pertinent Vitals/Pain Pain Assessment: 0-10 Pain Score: 2  Pain Location: L knee Pain Descriptors / Indicators: Sore Pain Intervention(s): Limited activity within patient's tolerance;Monitored during session;Premedicated before session;Repositioned;Ice applied    Home Living Family/patient expects to be discharged to:: Private residence Living Arrangements: Spouse/significant other Available Help at Discharge: Family Type of Home: House Home Access: Stairs to enter     Home Equipment: Environmental consultant - 2 wheels;Cane - single point;Crutches;Bedside commode      Prior Function Level of Independence: Independent          PT Goals (current goals can now be found in the care plan section) Acute Rehab PT Goals Patient Stated Goal: Resume previous lifestyle with decreased pain PT Goal Formulation: With patient Time For Goal Achievement: 07/18/15 Potential to Achieve Goals: Good Progress towards PT goals: Progressing toward goals    Frequency  7X/week    PT Plan Current plan remains appropriate    Co-evaluation             End of Session Equipment Utilized During Treatment: Gait belt;Left knee immobilizer Activity Tolerance: Patient tolerated treatment well Patient left: in bed;with call bell/phone within reach     Time: 1022-1048 PT  Time Calculation (min) (ACUTE ONLY): 26 min  Charges:  $Gait Training: 8-22 mins $Therapeutic Exercise: 8-22 mins                    G Codes:      Rachelann Enloe 07/16/15, 12:54 PM

## 2015-07-14 NOTE — Progress Notes (Signed)
   Subjective: 1 Day Post-Op Procedure(s) (LRB): LEFT TOTAL KNEE ARTHROPLASTY (Left) Patient reports pain as mild.   Plan is to go Home after hospital stay.  Objective: Vital signs in last 24 hours: Temp:  [97.3 F (36.3 C)-97.9 F (36.6 C)] 97.5 F (36.4 C) (01/07 0506) Pulse Rate:  [46-81] 69 (01/07 0506) Resp:  [8-22] 14 (01/07 0506) BP: (114-144)/(67-91) 122/72 mmHg (01/07 0506) SpO2:  [97 %-100 %] 97 % (01/07 0506) Weight:  [90.719 kg (200 lb)-94.348 kg (208 lb)] 90.719 kg (200 lb) (01/06 1150)  Intake/Output from previous day:  Intake/Output Summary (Last 24 hours) at 07/14/15 0727 Last data filed at 07/14/15 0622  Gross per 24 hour  Intake 3763.33 ml  Output   6300 ml  Net -2536.67 ml    Intake/Output this shift:    Labs:  Recent Labs  07/14/15 0450  HGB 12.4*    Recent Labs  07/14/15 0450  WBC 10.1  RBC 3.96*  HCT 37.7*  PLT 155    Recent Labs  07/14/15 0450  NA 138  K 4.2  CL 105  CO2 25  BUN 15  CREATININE 1.02  GLUCOSE 124*  CALCIUM 8.6*   No results for input(s): LABPT, INR in the last 72 hours.  EXAM General - Patient is Alert, Appropriate and Oriented Extremity - Neurologically intact Neurovascular intact No cellulitis present Compartment soft Dressing - dressing C/D/I Motor Function - intact, moving foot and toes well on exam.  Hemovac pulled without difficulty.  Past Medical History  Diagnosis Date  . Hypertension   . Diabetes mellitus without complication ([place])   . Stented coronary artery   . MRSA (methicillin resistant Staphylococcus aureus) infection     07/30/2008 thru 08/07/2008  . Coronary artery disease     stent placement / 2  . Sleep apnea     05/16/2014 uses bipap  . 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 Arthritis   . Wears glasses     Assessment/Plan: 1 Day Post-Op Procedure(s) (LRB): LEFT TOTAL KNEE ARTHROPLASTY (Left) Principal  Problem:   OA (osteoarthritis) of knee   Advance diet Up with therapy D/C IV fluids Plan for discharge tomorrow  DVT Prophylaxis - Xarelto Weight-Bearing as tolerated to left leg   Hafiz Irion V 07/14/2015, 7:27 AM

## 2015-07-14 NOTE — Evaluation (Signed)
Occupational Therapy Evaluation Patient Details Name: Andrew Cobb MRN: [id number] DOB: June 19, 1951 Today's Date: 07/14/2015    History of Present Illness L TKR; s/p R TKR (07), R TSR (07) and gastric bypass   Clinical Impression   This 65 year old man was admitted for the above surgery.  Will follow in acute setting to reinforce safety and to practice bathroom transfers.  Goals are for supervision level    Follow Up Recommendations  No OT follow up;Supervision/Assistance - 24 hour    Equipment Recommendations  None recommended by OT    Recommendations for Other Services       Precautions / Restrictions Precautions Precautions: Knee;Fall Required Braces or Orthoses: Knee Immobilizer - Left Knee Immobilizer - Left: Discontinue once straight leg raise with < 10 degree lag Restrictions Weight Bearing Restrictions: No Other Position/Activity Restrictions: WBAT      Mobility Bed Mobility         Supine to sit: Supervision     General bed mobility comments: pt managed RLE  Transfers   Equipment used: Rolling walker (2 wheeled) Transfers: Sit to/from Stand Sit to Stand: Min guard         General transfer comment: cues for UE/LE placement, safety    Balance                                            ADL Overall ADL's : Needs assistance/impaired             Lower Body Bathing: Minimal assistance;Sit to/from stand       Lower Body Dressing: Minimal assistance;Sit to/from stand   Toilet Transfer: Min guard;Ambulation (bed)             General ADL Comments: Pt performed ADL from EOB.  He ambulated to bathroom to urinate.  His toilet is higher than ours and he has a toilet riser if needed. Pt verbalizes sequence of shower transfer.  Moderate cues for safety;  using walker whenever standing, keeping walker in front of him and not "parking it", not pulling up on CPM     Vision     Perception     Praxis      Pertinent  Vitals/Pain Pain Score: 2  Pain Location: L knee Pain Descriptors / Indicators: Sore Pain Intervention(s): Limited activity within patient's tolerance;Monitored during session;Premedicated before session;Repositioned;Ice applied     Hand Dominance Right   Extremity/Trunk Assessment             Communication Communication Communication: No difficulties   Cognition Arousal/Alertness: Awake/alert Behavior During Therapy: WFL for tasks assessed/performed  Pt is a little impulsive Overall Cognitive Status: Within Functional Limits for tasks assessed:  Mostly, needs reinforcement with safety                     General Comments       Exercises       Shoulder Instructions      Home Living Family/patient expects to be discharged to:: Private residence Living Arrangements: Spouse/significant other Available Help at Discharge: Family Type of Home: House Home Access: Stairs to enter Technical brewer of Steps: 1         Bathroom Shower/Tub: Occupational psychologist: Handicapped height     Home Equipment: Environmental consultant - 2 wheels;Cane - single point;Crutches;Bedside commode  Prior Functioning/Environment Level of Independence: Independent             OT Diagnosis: Generalized weakness   OT Problem List: Decreased safety awareness;Decreased strength   OT Treatment/Interventions: Self-care/ADL training;Cognitive remediation/compensation;Patient/family education (safety)    OT Goals(Current goals can be found in the care plan section) Acute Rehab OT Goals Patient Stated Goal: Resume previous lifestyle with decreased pain ADL Goals Pt Will Transfer to Toilet: with supervision;ambulating;bedside commode Pt Will Perform Tub/Shower Transfer: Shower transfer;ambulating;with supervision Additional ADL Goal #1: pt will not need any more than one safety cue during OT session focusing on bathroom transfers  OT Frequency: Min 2X/week   Barriers  to D/C:            Co-evaluation              End of Session CPM Left Knee CPM Left Knee: Off  Activity Tolerance: Patient tolerated treatment well Patient left: in bed;with call bell/phone within reach;with bed alarm set   Time: [id number] OT Time Calculation (min): 18 min Charges:  OT General Charges $OT Visit: 1 Procedure OT Evaluation $OT Eval Low Complexity: 1 Procedure G-Codes:    Brittan Mapel 07-15-15, 11:33 AM   Lesle Chris, OTR/L [phone] Jul 15, 2015

## 2015-07-15 LAB — GLUCOSE, CAPILLARY
GLUCOSE-CAPILLARY: 116 mg/dL — AB (ref 65–99)
Glucose-Capillary: 118 mg/dL — ABNORMAL HIGH (ref 65–99)
Glucose-Capillary: 97 mg/dL (ref 65–99)
Glucose-Capillary: 107 mg/dL — ABNORMAL HIGH (ref 65–99)

## 2015-07-15 LAB — BASIC METABOLIC PANEL
Anion gap: 10 (ref 5–15)
BUN: 17 mg/dL (ref 6–20)
CALCIUM: 8.6 mg/dL — AB (ref 8.9–10.3)
CO2: 26 mmol/L (ref 22–32)
Chloride: 103 mmol/L (ref 101–111)
Creatinine, Ser: 0.96 mg/dL (ref 0.61–1.24)
GFR calc Af Amer: >60 mL/min (ref >60)
GLUCOSE: 140 mg/dL — AB (ref 65–99)
Potassium: 4.1 mmol/L (ref 3.5–5.1)
Sodium: 139 mmol/L (ref 135–145)

## 2015-07-15 LAB — CBC
HEMATOCRIT: 36 % — AB (ref 39.0–52.0)
Hemoglobin: 11.9 g/dL — ABNORMAL LOW (ref 13.0–17.0)
MCH: 31.7 pg (ref 26.0–34.0)
MCHC: 33.1 g/dL (ref 30.0–36.0)
MCV: 96 fL (ref 78.0–100.0)
PLATELETS: 160 10*3/uL (ref 150–400)
RBC: 3.75 MIL/uL — ABNORMAL LOW (ref 4.22–5.81)
RDW: 13.2 % (ref 11.5–15.5)
WBC: 10.2 10*3/uL (ref 4.0–10.5)

## 2015-07-15 NOTE — Progress Notes (Signed)
Subjective: 2 Days Post-Op Procedure(s) (LRB): LEFT TOTAL KNEE ARTHROPLASTY (Left) Patient reports pain as mild.  Tolerating PO's. Progressing with PT. Denies SOB, CP, or calf pain.   Objective: Vital signs in last 24 hours: Temp:  [97.5 F (36.4 C)-98.2 F (36.8 C)] 97.5 F (36.4 C) (01/08 0449) Pulse Rate:  [64-75] 64 (01/08 0449) Resp:  [16-20] 16 (01/08 0449) BP: (139-151)/(68-89) 150/89 mmHg (01/08 0449) SpO2:  [92 %-97 %] 97 % (01/08 0449)  Intake/Output from previous day: 01/07 0701 - 01/08 0700 In: 1080 [P.O.:1080] Out: 3120 [Urine:3120] Intake/Output this shift:     Recent Labs  07/14/15 0450 07/15/15 0440  HGB 12.4* 11.9*    Recent Labs  07/14/15 0450 07/15/15 0440  WBC 10.1 10.2  RBC 3.96* 3.75*  HCT 37.7* 36.0*  PLT 155 160    Recent Labs  07/14/15 0450 07/15/15 0440  NA 138 139  K 4.2 4.1  CL 105 103  CO2 25 26  BUN 15 17  CREATININE 1.02 0.96  GLUCOSE 124* 140*  CALCIUM 8.6* 8.6*   No results for input(s): LABPT, INR in the last 72 hours.  Alert and oriented x3. RRR, Lungs clear, BS x4. Left Calf soft and non tender. L knee dressing C/D/I. No DVT signs. No signs of infection or compartment syndrome. LLE grossly neurovascularly intact.   Assessment/Plan: 2 Days Post-Op Procedure(s) (LRB): LEFT TOTAL KNEE ARTHROPLASTY (Left) Up with PT Dressing changed Pain management Plan D/c tomorrow Continue current care  Brighton Delio L 07/15/2015, 8:59 AM

## 2015-07-15 NOTE — Progress Notes (Signed)
Physical Therapy Treatment Patient Details Name: Andrew Cobb MRN: [id number] DOB: 05-Jul-1951 Today's Date: 07/15/2015    History of Present Illness L TKR; s/p R TKR (07), R TSR (07) and gastric bypass    PT Comments    Pt progressing well with mobility including negotiating step into home  Follow Up Recommendations  Home health PT     Equipment Recommendations  None recommended by PT    Recommendations for Other Services OT consult     Precautions / Restrictions Precautions Precautions: Knee;Fall Required Braces or Orthoses: Knee Immobilizer - Left Knee Immobilizer - Left: Discontinue once straight leg raise with < 10 degree lag Restrictions Weight Bearing Restrictions: No Other Position/Activity Restrictions: WBAT    Mobility  Bed Mobility Overal bed mobility: Needs Assistance Bed Mobility: Supine to Sit;Sit to Supine     Supine to sit: Supervision Sit to supine: Supervision   General bed mobility comments: pt managed RLE himself; min cues for sequence  Transfers Overall transfer level: Needs assistance Equipment used: Rolling walker (2 wheeled) Transfers: Sit to/from Stand Sit to Stand: Supervision         General transfer comment: cue for UE placement  Ambulation/Gait Ambulation/Gait assistance: Min guard;Supervision Ambulation Distance (Feet): 140 Feet (and 15' from bathroom) Assistive device: Rolling walker (2 wheeled) Gait Pattern/deviations: Step-to pattern;Step-through pattern;Decreased step length - right;Decreased step length - left;Shuffle;Trunk flexed Gait velocity: decr   General Gait Details: min cues for sequence, posture and position from RW   Stairs Stairs: Yes Stairs assistance: Min assist Stair Management: No rails;Step to pattern;Backwards;Forwards;With walker Number of Stairs: 1 General stair comments: single step 4x with RW - twice bkwd and twice fwd.  Cues for sequence and foot/RW placement  Wheelchair Mobility     Modified [name] (Stroke Patients Only)       Balance                                    Cognition Arousal/Alertness: Awake/alert Behavior During Therapy: WFL for tasks assessed/performed Overall Cognitive Status: Within Functional Limits for tasks assessed                      Exercises      General Comments        Pertinent Vitals/Pain Pain Assessment: 0-10 Pain Score: 4  Pain Location: L knee Pain Descriptors / Indicators: Aching;Sore Pain Intervention(s): Limited activity within patient's tolerance;Monitored during session;Premedicated before session;Ice applied    Home Living                      Prior Function            PT Goals (current goals can now be found in the care plan section) Acute Rehab PT Goals Patient Stated Goal: Resume previous lifestyle with decreased pain PT Goal Formulation: With patient Time For Goal Achievement: 07/18/15 Potential to Achieve Goals: Good Progress towards PT goals: Progressing toward goals    Frequency  7X/week    PT Plan Current plan remains appropriate    Co-evaluation             End of Session Equipment Utilized During Treatment: Gait belt Activity Tolerance: Patient tolerated treatment well Patient left: in bed;with call bell/phone within reach     Time: 1400-1429 PT Time Calculation (min) (ACUTE ONLY): 29 min  Charges:  $Gait Training: 23-37 mins  G Codes:      Lyla Jasek 08-06-2015, 4:43 PM

## 2015-07-15 NOTE — Progress Notes (Signed)
Physical Therapy Treatment Patient Details Name: Andrew Cobb MRN: [id number] DOB: 01/23/1951 Today's Date: August 10, 2015    History of Present Illness L TKR; s/p R TKR (07), R TSR (07) and gastric bypass    PT Comments    Pt progressing well with mobility.  OOB deferred pending additional pain meds.  Follow Up Recommendations  Home health PT     Equipment Recommendations  None recommended by PT    Recommendations for Other Services OT consult     Precautions / Restrictions Precautions Precautions: Knee;Fall Required Braces or Orthoses: Knee Immobilizer - Left Knee Immobilizer - Left: Discontinue once straight leg raise with < 10 degree lag Restrictions Weight Bearing Restrictions: No Other Position/Activity Restrictions: WBAT    Mobility  Bed Mobility                  Transfers                    Ambulation/Gait                 Stairs            Wheelchair Mobility    Modified [name] (Stroke Patients Only)       Balance                                    Cognition Arousal/Alertness: Awake/alert Behavior During Therapy: WFL for tasks assessed/performed Overall Cognitive Status: Within Functional Limits for tasks assessed                      Exercises Total Joint Exercises Ankle Circles/Pumps: AROM;Both;15 reps;Supine Quad Sets: AROM;Both;Supine;20 reps Heel Slides: AAROM;Left;Supine;20 reps Straight Leg Raises: AAROM;AROM;Left;Supine;20 reps Goniometric ROM: AAROM at L knee -10 - 80    General Comments        Pertinent Vitals/Pain Pain Assessment: 0-10 Pain Score: 7  Pain Location: L knee Pain Descriptors / Indicators: Aching;Sore Pain Intervention(s): Limited activity within patient's tolerance;Monitored during session;Premedicated before session;Patient requesting pain meds-RN notified;Ice applied    Home Living                      Prior Function            PT Goals  (current goals can now be found in the care plan section) Acute Rehab PT Goals Patient Stated Goal: Resume previous lifestyle with decreased pain PT Goal Formulation: With patient Time For Goal Achievement: 07/18/15 Potential to Achieve Goals: Good Progress towards PT goals: Progressing toward goals    Frequency  7X/week    PT Plan Current plan remains appropriate    Co-evaluation             End of Session Equipment Utilized During Treatment: Gait belt Activity Tolerance: Patient tolerated treatment well Patient left: in bed;with call bell/phone within reach     Time: 0843-0906 PT Time Calculation (min) (ACUTE ONLY): 23 min  Charges:  $Therapeutic Exercise: 23-37 mins                    G Codes:      Andrew Cobb 08-10-2015, 12:30 PM

## 2015-07-15 NOTE — Progress Notes (Signed)
Occupational Therapy Treatment Patient Details Name: Andrew Cobb MRN: [id number] DOB: 07/12/1950 Today's Date: 07/15/2015    History of present illness L TKR; s/p R TKR (07), R TSR (07) and gastric bypass   OT comments  Pt much safer today than yesterday.  Still recommend 24/7 supervision; he did need one cue for safety this session.  All OT goals are met--no further OT needs at this time  Follow Up Recommendations  No OT follow up;Supervision/Assistance - 24 hour    Equipment Recommendations  None recommended by OT    Recommendations for Other Services      Precautions / Restrictions Precautions Precautions: Knee;Fall Restrictions Weight Bearing Restrictions: No Other Position/Activity Restrictions: WBAT       Mobility Bed Mobility         Supine to sit: Supervision Sit to supine: Supervision   General bed mobility comments: pt managed RLE himself  Transfers   Equipment used: Rolling walker (2 wheeled) Transfers: Sit to/from Stand Sit to Stand: Supervision         General transfer comment: cue for UE placement    Balance                                   ADL                           Toilet Transfer: Supervision/safety;Ambulation       Tub/ Shower Transfer: Supervision/safety;Ambulation;Walk-in shower     General ADL Comments: ambulated to bathroom at supervision level and performed bathroom transfers with supervision.  Pt needed one cue to sidestep through tight space.  Pt stood at toilet to urinate      Vision                     Perception     Praxis      Cognition   Behavior During Therapy: WFL for tasks assessed/performed Overall Cognitive Status: Within Functional Limits for tasks assessed                       Extremity/Trunk Assessment               Exercises     Shoulder Instructions       General Comments      Pertinent Vitals/ Pain       Pain Score: 4  Pain  Location: L knee Pain Descriptors / Indicators: Sore Pain Intervention(s): Limited activity within patient's tolerance;Monitored during session;Premedicated before session;Repositioned;Ice applied  Home Living                                          Prior Functioning/Environment              Frequency       Progress Toward Goals  OT Goals(current goals can now be found in the care plan section)  Progress towards OT goals: Goals met/education completed, patient discharged from OT     Plan      Co-evaluation                 End of Session CPM Left Knee CPM Left Knee: Off   Activity Tolerance Patient tolerated treatment well   Patient Left in bed;with call bell/phone   within reach;with bed alarm set   Nurse Communication          Time: 1344-1358 OT Time Calculation (min): 14 min  Charges: OT General Charges $OT Visit: 1 Procedure OT Treatments $Self Care/Home Management : 8-22 mins  Cobb,Andrew 07/15/2015, 2:43 PM Andrew Cobb, OTR/L [phone] 07/15/2015   

## 2015-07-16 ENCOUNTER — Encounter (HOSPITAL_COMMUNITY): Payer: Self-pay | Admitting: Orthopedic Surgery

## 2015-07-16 LAB — GLUCOSE, CAPILLARY
GLUCOSE-CAPILLARY: 121 mg/dL — AB (ref 65–99)
Glucose-Capillary: 121 mg/dL — ABNORMAL HIGH (ref 65–99)

## 2015-07-16 LAB — CBC
HEMATOCRIT: 37.7 % — AB (ref 39.0–52.0)
Hemoglobin: 12.5 g/dL — ABNORMAL LOW (ref 13.0–17.0)
MCH: 32.1 pg (ref 26.0–34.0)
MCHC: 33.2 g/dL (ref 30.0–36.0)
MCV: 96.7 fL (ref 78.0–100.0)
PLATELETS: 157 10*3/uL (ref 150–400)
RBC: 3.9 MIL/uL — ABNORMAL LOW (ref 4.22–5.81)
RDW: 13.3 % (ref 11.5–15.5)
WBC: 8.3 10*3/uL (ref 4.0–10.5)

## 2015-07-16 NOTE — Care Management Note (Signed)
Case Management Note  Patient Details  Name: Andrew Cobb MRN: [id number] Date of Birth: [birth year]  Subjective/Objective:                    Action/Plan:d/c home w/[facility].   Expected Discharge Date:                Expected Discharge Plan:  [place]  In-House Referral:     Discharge planning Services  CM Consult  Post Acute Care Choice:  Home Health Choice offered to:  Patient  DME Arranged:    DME Agency:     HH Arranged:  PT HH Agency:  [facility]  Status of Service:  Completed, signed off  Medicare Important Message Given:    Date Medicare IM Given:    Medicare IM give by:    Date Additional Medicare IM Given:    Additional Medicare Important Message give by:     If discussed at [place] of Stay Meetings, dates discussed:    Additional Comments:  Dessa Phi, RN 07/16/2015, 12:31 PM

## 2015-07-16 NOTE — Progress Notes (Signed)
   Subjective: 3 Days Post-Op Procedure(s) (LRB): LEFT TOTAL KNEE ARTHROPLASTY (Left) Patient reports pain as mild.   Patient seen in rounds by Dr. Wynelle Link. Patient is well, but has had some minor complaints of pain in the knee, requiring pain medications Patient is ready to go home  Objective: Vital signs in last 24 hours: Temp:  [97.5 F (36.4 C)-99.1 F (37.3 C)] 97.5 F (36.4 C) (01/09 0402) Pulse Rate:  [62-69] 69 (01/08 2239) Resp:  [16-17] 16 (01/09 0402) BP: (143-153)/(74-85) 153/85 mmHg (01/09 0402) SpO2:  [97 %-100 %] 97 % (01/09 0402)  Intake/Output from previous day:  Intake/Output Summary (Last 24 hours) at 07/16/15 0731 Last data filed at 07/16/15 0600  Gross per 24 hour  Intake    360 ml  Output   1060 ml  Net   -700 ml    Labs:  Recent Labs  07/14/15 0450 07/15/15 0440 07/16/15 0348  HGB 12.4* 11.9* 12.5*    Recent Labs  07/15/15 0440 07/16/15 0348  WBC 10.2 8.3  RBC 3.75* 3.90*  HCT 36.0* 37.7*  PLT 160 157    Recent Labs  07/14/15 0450 07/15/15 0440  NA 138 139  K 4.2 4.1  CL 105 103  CO2 25 26  BUN 15 17  CREATININE 1.02 0.96  GLUCOSE 124* 140*  CALCIUM 8.6* 8.6*   No results for input(s): LABPT, INR in the last 72 hours.  EXAM: General - Patient is Alert, Appropriate and Oriented Extremity - Neurovascular intact Sensation intact distally Dorsiflexion/Plantar flexion intact Incision - clean, dry, no drainage Motor Function - intact, moving foot and toes well on exam.   Assessment/Plan: 3 Days Post-Op Procedure(s) (LRB): LEFT TOTAL KNEE ARTHROPLASTY (Left) Procedure(s) (LRB): LEFT TOTAL KNEE ARTHROPLASTY (Left) Past Medical History  Diagnosis Date  . Hypertension   . Diabetes mellitus without complication ([place])   . Stented coronary artery   . MRSA (methicillin resistant Staphylococcus aureus) infection     07/30/2008 thru 08/07/2008  . Coronary artery disease     stent placement / 2  . Sleep apnea     05/16/2014  uses bipap  . 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 Arthritis   . Wears glasses    Principal Problem:   OA (osteoarthritis) of knee  Estimated body mass index is 28.28 kg/(m^2) as calculated from the following:   Height as of this encounter: 5' 10.5" (1.791 m).   Weight as of this encounter: 90.719 kg (200 lb). Up with therapy Discharge home with home health Diet - Cardiac diet and Diabetic diet Follow up - in 2 weeks Activity - WBAT Disposition - Home Condition Upon Discharge - Good D/C Meds - See DC Summary DVT Prophylaxis - Xarelto  Andrew Muslim, PA-C Orthopaedic Surgery 07/16/2015, 7:31 AM

## 2015-07-16 NOTE — Discharge Summary (Signed)
Physician Discharge Summary   Patient ID: Andrew Cobb MRN: [id number] DOB/AGE: 1950/11/06 65 y.o.  Admit date: 07/13/2015 Discharge date: 07-16-2015  Primary Diagnosis:  Osteoarthritis Left knee(s)  Admission Diagnoses:  Past Medical History  Diagnosis Date  . Hypertension   . Diabetes mellitus without complication ([place])   . Stented coronary artery   . MRSA (methicillin resistant Staphylococcus aureus) infection     07/30/2008 thru 08/07/2008  . Coronary artery disease     stent placement / 2  . Sleep apnea     05/16/2014 uses bipap  . 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 Arthritis   . Wears glasses    Discharge Diagnoses:   Principal Problem:   OA (osteoarthritis) of knee  Estimated body mass index is 28.28 kg/(m^2) as calculated from the following:   Height as of this encounter: 5' 10.5" (1.791 m).   Weight as of this encounter: 90.719 kg (200 lb).  Procedure:  Procedure(s) (LRB): LEFT TOTAL KNEE ARTHROPLASTY (Left)   Consults: None  HPI: Andrew Cobb is a 65 y.o. year old male with end stage OA of his left knee with progressively worsening pain and dysfunction. He has constant pain, with activity and at rest and significant functional deficits with difficulties even with ADLs. He has had extensive non-op management including analgesics, injections of cortisone and viscosupplements, and home exercise program, but remains in significant pain with significant dysfunction. Radiographs show bone on bone arthritis medial and patellofemoral. He presents now for left Total Knee Arthroplasty.  Laboratory Data: Admission on 07/13/2015  Component Date Value Ref Range Status  . Glucose-Capillary 07/13/2015 107* 65 - 99 mg/dL Final  . Comment 1 07/13/2015 Notify RN   Final  . Glucose-Capillary 07/13/2015 100* 65 - 99 mg/dL Final  . Comment 1 07/13/2015 Notify RN   Final  . Comment 2 07/13/2015 Document in  Chart   Final  . WBC 07/14/2015 10.1  4.0 - 10.5 K/uL Final  . RBC 07/14/2015 3.96* 4.22 - 5.81 MIL/uL Final  . Hemoglobin 07/14/2015 12.4* 13.0 - 17.0 g/dL Final  . HCT 07/14/2015 37.7* 39.0 - 52.0 % Final  . MCV 07/14/2015 95.2  78.0 - 100.0 fL Final  . MCH 07/14/2015 31.3  26.0 - 34.0 pg Final  . MCHC 07/14/2015 32.9  30.0 - 36.0 g/dL Final  . RDW 07/14/2015 12.9  11.5 - 15.5 % Final  . Platelets 07/14/2015 155  150 - 400 K/uL Final  . Sodium 07/14/2015 138  135 - 145 mmol/L Final  . Potassium 07/14/2015 4.2  3.5 - 5.1 mmol/L Final  . Chloride 07/14/2015 105  101 - 111 mmol/L Final  . CO2 07/14/2015 25  22 - 32 mmol/L Final  . Glucose, Bld 07/14/2015 124* 65 - 99 mg/dL Final  . BUN 07/14/2015 15  6 - 20 mg/dL Final  . Creatinine, Ser 07/14/2015 1.02  0.61 - 1.24 mg/dL Final  . Calcium 07/14/2015 8.6* 8.9 - 10.3 mg/dL Final  . GFR calc non Af Amer 07/14/2015 >60  >60 mL/min Final  . GFR calc Af Amer 07/14/2015 >60  >60 mL/min Final   Comment: (NOTE) The eGFR has been calculated using the CKD EPI equation. This calculation has not been validated in all clinical situations. eGFR's persistently <60 mL/min signify possible Chronic Kidney Disease.   . Anion gap 07/14/2015 8  5 - 15 Final  . Glucose-Capillary 07/13/2015 139* 65 -  99 mg/dL Final  . Glucose-Capillary 07/13/2015 197* 65 - 99 mg/dL Final  . Glucose-Capillary 07/14/2015 190* 65 - 99 mg/dL Final  . Glucose-Capillary 07/14/2015 159* 65 - 99 mg/dL Final  . WBC 07/15/2015 10.2  4.0 - 10.5 K/uL Final  . RBC 07/15/2015 3.75* 4.22 - 5.81 MIL/uL Final  . Hemoglobin 07/15/2015 11.9* 13.0 - 17.0 g/dL Final  . HCT 07/15/2015 36.0* 39.0 - 52.0 % Final  . MCV 07/15/2015 96.0  78.0 - 100.0 fL Final  . MCH 07/15/2015 31.7  26.0 - 34.0 pg Final  . MCHC 07/15/2015 33.1  30.0 - 36.0 g/dL Final  . RDW 07/15/2015 13.2  11.5 - 15.5 % Final  . Platelets 07/15/2015 160  150 - 400 K/uL Final  . Sodium 07/15/2015 139  135 - 145 mmol/L Final    . Potassium 07/15/2015 4.1  3.5 - 5.1 mmol/L Final  . Chloride 07/15/2015 103  101 - 111 mmol/L Final  . CO2 07/15/2015 26  22 - 32 mmol/L Final  . Glucose, Bld 07/15/2015 140* 65 - 99 mg/dL Final  . BUN 07/15/2015 17  6 - 20 mg/dL Final  . Creatinine, Ser 07/15/2015 0.96  0.61 - 1.24 mg/dL Final  . Calcium 07/15/2015 8.6* 8.9 - 10.3 mg/dL Final  . GFR calc non Af Amer 07/15/2015 >60  >60 mL/min Final  . GFR calc Af Amer 07/15/2015 >60  >60 mL/min Final   Comment: (NOTE) The eGFR has been calculated using the CKD EPI equation. This calculation has not been validated in all clinical situations. eGFR's persistently <60 mL/min signify possible Chronic Kidney Disease.   . Anion gap 07/15/2015 10  5 - 15 Final  . Glucose-Capillary 07/14/2015 211* 65 - 99 mg/dL Final  . Glucose-Capillary 07/14/2015 232* 65 - 99 mg/dL Final  . Glucose-Capillary 07/15/2015 118* 65 - 99 mg/dL Final  . Glucose-Capillary 07/15/2015 116* 65 - 99 mg/dL Final  . WBC 07/16/2015 8.3  4.0 - 10.5 K/uL Final  . RBC 07/16/2015 3.90* 4.22 - 5.81 MIL/uL Final  . Hemoglobin 07/16/2015 12.5* 13.0 - 17.0 g/dL Final  . HCT 07/16/2015 37.7* 39.0 - 52.0 % Final  . MCV 07/16/2015 96.7  78.0 - 100.0 fL Final  . MCH 07/16/2015 32.1  26.0 - 34.0 pg Final  . MCHC 07/16/2015 33.2  30.0 - 36.0 g/dL Final  . RDW 07/16/2015 13.3  11.5 - 15.5 % Final  . Platelets 07/16/2015 157  150 - 400 K/uL Final  . Glucose-Capillary 07/15/2015 97  65 - 99 mg/dL Final  . Glucose-Capillary 07/15/2015 107* 65 - 99 mg/dL Final  . Comment 1 07/15/2015 Notify RN   Final  . Comment 2 07/15/2015 Document in Chart   Final  . Glucose-Capillary 07/16/2015 121* 65 - 99 mg/dL Final  Hospital Outpatient Visit on 07/05/2015  Component Date Value Ref Range Status  . aPTT 07/05/2015 28  24 - 37 seconds Final  . WBC 07/05/2015 6.8  4.0 - 10.5 K/uL Final  . RBC 07/05/2015 4.81  4.22 - 5.81 MIL/uL Final  . Hemoglobin 07/05/2015 15.3  13.0 - 17.0 g/dL Final  .  HCT 07/05/2015 46.4  39.0 - 52.0 % Final  . MCV 07/05/2015 96.5  78.0 - 100.0 fL Final  . MCH 07/05/2015 31.8  26.0 - 34.0 pg Final  . MCHC 07/05/2015 33.0  30.0 - 36.0 g/dL Final  . RDW 07/05/2015 13.2  11.5 - 15.5 % Final  . Platelets 07/05/2015 155  150 - 400 K/uL  Final  . Sodium 07/05/2015 139  135 - 145 mmol/L Final  . Potassium 07/05/2015 4.2  3.5 - 5.1 mmol/L Final  . Chloride 07/05/2015 102  101 - 111 mmol/L Final  . CO2 07/05/2015 28  22 - 32 mmol/L Final  . Glucose, Bld 07/05/2015 113* 65 - 99 mg/dL Final  . BUN 07/05/2015 22* 6 - 20 mg/dL Final  . Creatinine, Ser 07/05/2015 1.19  0.61 - 1.24 mg/dL Final  . Calcium 07/05/2015 9.3  8.9 - 10.3 mg/dL Final  . Total Protein 07/05/2015 7.4  6.5 - 8.1 g/dL Final  . Albumin 07/05/2015 4.4  3.5 - 5.0 g/dL Final  . AST 07/05/2015 31  15 - 41 U/L Final  . ALT 07/05/2015 45  17 - 63 U/L Final  . Alkaline Phosphatase 07/05/2015 77  38 - 126 U/L Final  . Total Bilirubin 07/05/2015 0.8  0.3 - 1.2 mg/dL Final  . GFR calc non Af Amer 07/05/2015 >60  >60 mL/min Final  . GFR calc Af Amer 07/05/2015 >60  >60 mL/min Final   Comment: (NOTE) The eGFR has been calculated using the CKD EPI equation. This calculation has not been validated in all clinical situations. eGFR's persistently <60 mL/min signify possible Chronic Kidney Disease.   . Anion gap 07/05/2015 9  5 - 15 Final  . Prothrombin Time 07/05/2015 13.5  11.6 - 15.2 seconds Final  . INR 07/05/2015 1.01  0.00 - 1.49 Final  . ABO/RH(D) 07/05/2015 A POS   Final  . Antibody Screen 07/05/2015 NEG   Final  . Sample Expiration 07/05/2015 07/19/2015   Final  . Extend sample reason 07/05/2015 NO TRANSFUSIONS OR PREGNANCY IN THE PAST 3 MONTHS   Final  . Color, Urine 07/05/2015 YELLOW  YELLOW Final  . APPearance 07/05/2015 CLEAR  CLEAR Final  . Specific Gravity, Urine 07/05/2015 1.018  1.005 - 1.030 Final  . pH 07/05/2015 5.5  5.0 - 8.0 Final  . Glucose, UA 07/05/2015 NEGATIVE  NEGATIVE mg/dL  Final  . Hgb urine dipstick 07/05/2015 NEGATIVE  NEGATIVE Final  . Bilirubin Urine 07/05/2015 NEGATIVE  NEGATIVE Final  . Ketones, ur 07/05/2015 NEGATIVE  NEGATIVE mg/dL Final  . Protein, ur 07/05/2015 NEGATIVE  NEGATIVE mg/dL Final  . Nitrite 07/05/2015 NEGATIVE  NEGATIVE Final  . Leukocytes, UA 07/05/2015 NEGATIVE  NEGATIVE Final   MICROSCOPIC NOT DONE ON URINES WITH NEGATIVE PROTEIN, BLOOD, LEUKOCYTES, NITRITE, OR GLUCOSE <1000 mg/dL.  [name] Kitchen MRSA, PCR 07/05/2015 NEGATIVE  NEGATIVE Final  . Staphylococcus aureus 07/05/2015 NEGATIVE  NEGATIVE Final   Comment:        The Xpert SA Assay (FDA approved for NASAL specimens in patients over 29 years of age), is one component of a comprehensive surveillance program.  Test performance has been validated by [facility] for patients greater than or equal to 47 year old. It is not intended to diagnose infection nor to guide or monitor treatment.   . Hgb A1c MFr Bld 07/05/2015 6.5* 4.8 - 5.6 % Final   Comment: (NOTE)         Pre-diabetes: 5.7 - 6.4         Diabetes: >6.4         Glycemic control for adults with diabetes: <7.0   . Mean Plasma Glucose 07/05/2015 140   Final   Comment: (NOTE) Performed At: [facility] [place], [name] [id number] Lindon Romp MD PO:[id number]      X-Rays:Dg Chest 2 View  07/05/2015  CLINICAL DATA:  Preop for left knee arthroplasty. History of hypertension and diabetes. EXAM: CHEST  2 VIEW COMPARISON:  07/11/2012 FINDINGS: Cardiac silhouette normal in size and configuration. Normal mediastinal and hilar contours. Mild reticular scarring at the left lung base. Lungs otherwise clear. No pleural effusion or pneumothorax. Right shoulder prosthesis is well aligned and stable. Bony thorax is demineralized but grossly intact. IMPRESSION: No acute cardiopulmonary disease. Electronically Signed   By: Lajean Manes M.D.   On: 07/05/2015 12:31    EKG: Orders placed or performed in visit on  05/28/15  . EKG 12-Lead     Hospital Course: Andrew Cobb is a 65 y.o. who was admitted to [facility]. They were brought to the operating room on 07/13/2015 and underwent Procedure(s): LEFT TOTAL KNEE ARTHROPLASTY.  Patient tolerated the procedure well and was later transferred to the recovery room and then to the orthopaedic floor for postoperative care.  They were given PO and IV analgesics for pain control following their surgery.  They were given 24 hours of postoperative antibiotics of  Anti-infectives    Start     Dose/Rate Route Frequency Ordered Stop   07/13/15 1600  ceFAZolin (ANCEF) IVPB 2 g/50 mL premix     2 g 100 mL/hr over 30 Minutes Intravenous Every 6 hours 07/13/15 1203 07/13/15 2241   07/13/15 0702  ceFAZolin (ANCEF) IVPB 2 g/50 mL premix     2 g 100 mL/hr over 30 Minutes Intravenous On call to O.R. 07/13/15 [id number] 07/13/15 0915     and started on DVT prophylaxis in the form of Xarelto.   PT and OT were ordered for total joint protocol.  Discharge planning consulted to help with postop disposition and equipment needs.  Patient had a decnt night on the evening of surgery.  They started to get up OOB with therapy on day one. Hemovac drain was pulled without difficulty.  Continued to work with therapy into day two.  Dressing was changed on day two and the incision was healing well.  By day three, the patient had progressed with therapy and meeting their goals.  Incision was healing well.  Patient was seen in rounds and was ready to go home.  Discharge home with home health Diet - Cardiac diet and Diabetic diet Follow up - in 2 weeks Activity - WBAT Disposition - Home Condition Upon Discharge - Good D/C Meds - See DC Summary DVT Prophylaxis - Xarelto   Discharge Instructions    Call MD / Call 911    Complete by:  As directed   If you experience chest pain or shortness of breath, CALL 911 and be transported to the hospital emergency room.  If you develope a fever  above 101 F, pus (white drainage) or increased drainage or redness at the wound, or calf pain, call your surgeon's office.     Change dressing    Complete by:  As directed   Change dressing daily with sterile 4 x 4 inch gauze dressing and apply TED hose. Do not submerge the incision under water.     Constipation Prevention    Complete by:  As directed   Drink plenty of fluids.  Prune juice may be helpful.  You may use a stool softener, such as Colace (over the counter) 100 mg twice a day.  Use MiraLax (over the counter) for constipation as needed.     Diet - low sodium heart healthy    Complete by:  As directed  Diet Carb Modified    Complete by:  As directed      Discharge instructions    Complete by:  As directed   Pick up stool softner and laxative for home use following surgery while on pain medications. Do not submerge incision under water. Please use good hand washing techniques while changing dressing each day. May shower starting three days after surgery. Please use a clean towel to pat the incision dry following showers. Continue to use ice for pain and swelling after surgery. Do not use any lotions or creams on the incision until instructed by your surgeon.  Take Xarelto for two and a half more weeks, then discontinue Xarelto. Once the patient has completed the blood thinner regimen, then take a Baby 81 mg Aspirin daily for three more weeks.  Postoperative Constipation Protocol  Constipation - defined medically as fewer than three stools per week and severe constipation as less than one stool per week.  One of the most common issues patients have following surgery is constipation.  Even if you have a regular bowel pattern at home, your normal regimen is likely to be disrupted due to multiple reasons following surgery.  Combination of anesthesia, postoperative narcotics, change in appetite and fluid intake all can affect your bowels.  In order to avoid complications following  surgery, here are some recommendations in order to help you during your recovery period.  Colace (docusate) - Pick up an over-the-counter form of Colace or another stool softener and take twice a day as long as you are requiring postoperative pain medications.  Take with a full glass of water daily.  If you experience loose stools or diarrhea, hold the colace until you stool forms back up.  If your symptoms do not get better within 1 week or if they get worse, check with your doctor.  Dulcolax (bisacodyl) - Pick up over-the-counter and take as directed by the product packaging as needed to assist with the movement of your bowels.  Take with a full glass of water.  Use this product as needed if not relieved by Colace only.   MiraLax (polyethylene glycol) - Pick up over-the-counter to have on hand.  MiraLax is a solution that will increase the amount of water in your bowels to assist with bowel movements.  Take as directed and can mix with a glass of water, juice, soda, coffee, or tea.  Take if you go more than two days without a movement. Do not use MiraLax more than once per day. Call your doctor if you are still constipated or irregular after using this medication for 7 days in a row.  If you continue to have problems with postoperative constipation, please contact the office for further assistance and recommendations.  If you experience "the worst abdominal pain ever" or develop nausea or vomiting, please contact the office immediatly for further recommendations for treatment.     Do not put a pillow under the knee. Place it under the heel.    Complete by:  As directed      Do not sit on low chairs, stoools or toilet seats, as it may be difficult to get up from low surfaces    Complete by:  As directed      Driving restrictions    Complete by:  As directed   No driving until released by the physician.     Increase activity slowly as tolerated    Complete by:  As directed  Lifting  restrictions    Complete by:  As directed   No lifting until released by the physician.     Patient may shower    Complete by:  As directed   You may shower without a dressing once there is no drainage.  Do not wash over the wound.  If drainage remains, do not shower until drainage stops.     TED hose    Complete by:  As directed   Use stockings (TED hose) for 3 weeks on both leg(s).  You may remove them at night for sleeping.     Weight bearing as tolerated    Complete by:  As directed   Laterality:  left  Extremity:  Lower            Medication List    STOP taking these medications        Magnesium 250 MG Tabs     MENS MULTIVITAMIN PLUS PO     ONE TOUCH ULTRA SYSTEM KIT w/Device Kit     Vitamin D3 5000 units Caps      TAKE these medications        benzonatate 100 MG capsule  Commonly known as:  TESSALON  Take 1 capsule (100 mg total) by mouth 3 (three) times daily as needed.     cetirizine 10 MG tablet  Commonly known as:  ZYRTEC  Take 10 mg by mouth daily.     dextromethorphan-guaiFENesin 30-600 MG 12hr tablet  Commonly known as:  MUCINEX DM  Take 1 tablet by mouth 2 (two) times daily.     fluticasone 50 MCG/ACT nasal spray  Commonly known as:  FLONASE  Place 2 sprays into both nostrils daily as needed for allergies.     glucose blood test strip  1 each by Other route 2 (two) times daily. DX E11.9     lisinopril 10 MG tablet  Commonly known as:  PRINIVIL,ZESTRIL  Take 1 tablet (10 mg total) by mouth daily.     metFORMIN 500 MG tablet  Commonly known as:  GLUCOPHAGE  Take 1 tablet (500 mg total) by mouth 2 (two) times daily with a meal.     methocarbamol 500 MG tablet  Commonly known as:  ROBAXIN  Take 1 tablet (500 mg total) by mouth every 6 (six) hours as needed for muscle spasms.     onetouch ultrasoft lancets  1 each by Other route 2 (two) times daily. Dx E11.9     oxyCODONE 5 MG immediate release tablet  Commonly known as:  Oxy IR/ROXICODONE    Take 1-2 tablets (5-10 mg total) by mouth every 3 (three) hours as needed for breakthrough pain.     oxymetazoline 0.05 % nasal spray  Commonly known as:  AFRIN NASAL SPRAY  Place 1 spray into both nostrils 2 (two) times daily.     rivaroxaban 10 MG Tabs tablet  Commonly known as:  XARELTO  Take 1 tablet (10 mg total) by mouth daily with breakfast.     traMADol 50 MG tablet  Commonly known as:  ULTRAM  Take 1-2 tablets (50-100 mg total) by mouth every 6 (six) hours as needed for moderate pain.     valACYclovir 500 MG tablet  Commonly known as:  VALTREX  Take 500 mg by mouth 2 (two) times daily.           Follow-up Information    Follow up with Gearlean Alf, MD. Schedule an appointment as soon as possible for  a visit on 07/26/2015.   Specialty:  Orthopedic Surgery   Why:  Call [phone] Monday to make the appointment   Contact information:   [address] [place] [address] [phone]       Signed: Arlee Muslim, PA-C Orthopaedic Surgery 07/16/2015, 7:35 AM

## 2015-07-16 NOTE — Progress Notes (Signed)
Physical Therapy Treatment Patient Details Name: MACARIUS RUSZKOWSKI MRN: [id number] DOB: [birth year] Today's Date: 07/16/2015    History of Present Illness L TKR; s/p R TKR (07), R TSR (07) and gastric bypass    PT Comments    Pt reports just being in bathroom and washing up however agreeable to ambulate.  Pt progressing well with ambulation.  Pt declined need to practice steps again today as well as declined exercises stating he performed them last night.  Pt encouraged to perform at least knee flexion and extension exercises once safely home.  Also recommended pt wear KI for ride home for safety (especially due to weather conditions).  Pt did not have any further questions and feels ready for d/c home.   Follow Up Recommendations  Home health PT     Equipment Recommendations  None recommended by PT    Recommendations for Other Services       Precautions / Restrictions Precautions Precautions: Knee;Fall Precaution Comments: did not use for gait today as pt reports being up this morning for bathing without KI however encouraged using KI for trip home for safety Required Braces or Orthoses: Knee Immobilizer - Left Knee Immobilizer - Left: Discontinue once straight leg raise with < 10 degree lag Restrictions Weight Bearing Restrictions: No Other Position/Activity Restrictions: WBAT    Mobility  Bed Mobility Overal bed mobility: Needs Assistance Bed Mobility: Sit to Supine       Sit to supine: Supervision   General bed mobility comments: pt able to self assist L LE onto bed  Transfers Overall transfer level: Needs assistance Equipment used: Rolling walker (2 wheeled) Transfers: Sit to/from Stand Sit to Stand: Supervision         General transfer comment: increased time however performed safe technique  Ambulation/Gait Ambulation/Gait assistance: Supervision Ambulation Distance (Feet): 80 Feet Assistive device: Rolling walker (2 wheeled) Gait Pattern/deviations:  Step-to pattern;Antalgic Gait velocity: decr   General Gait Details: verbal cues for technique and posture, verbal cue for L heel contact with floor   Stairs Stairs:  (declined need to practice today)          Wheelchair Mobility    Modified [name] (Stroke Patients Only)       Balance                                    Cognition Arousal/Alertness: Awake/alert Behavior During Therapy: WFL for tasks assessed/performed Overall Cognitive Status: Within Functional Limits for tasks assessed                      Exercises      General Comments        Pertinent Vitals/Pain Pain Assessment: 0-10 Pain Score: 6  Pain Location: L knee Pain Descriptors / Indicators: Aching;Sore Pain Intervention(s): Limited activity within patient's tolerance;Monitored during session;Patient requesting pain meds-RN notified;Ice applied    Home Living                      Prior Function            PT Goals (current goals can now be found in the care plan section) Progress towards PT goals: Progressing toward goals    Frequency  7X/week    PT Plan Current plan remains appropriate    Co-evaluation             End of Session  Activity Tolerance: Patient tolerated treatment well Patient left: in bed;with call bell/phone within reach     Time: 0931-0942 PT Time Calculation (min) (ACUTE ONLY): 11 min  Charges:  $Gait Training: 8-22 mins                    G Codes:      Damarius Karnes,KATHrine E 07/20/2015, 11:32 AM Carmelia Bake, PT, DPT July 20, 2015 Pager: [phone]

## 2015-07-17 DIAGNOSIS — J329 Chronic sinusitis, unspecified: Secondary | ICD-10-CM | POA: Diagnosis not present

## 2015-07-17 DIAGNOSIS — Z96651 Presence of right artificial knee joint: Secondary | ICD-10-CM | POA: Diagnosis not present

## 2015-07-17 DIAGNOSIS — Z7984 Long term (current) use of oral hypoglycemic drugs: Secondary | ICD-10-CM | POA: Diagnosis not present

## 2015-07-17 DIAGNOSIS — E119 Type 2 diabetes mellitus without complications: Secondary | ICD-10-CM | POA: Diagnosis not present

## 2015-07-17 DIAGNOSIS — I2510 Atherosclerotic heart disease of native coronary artery without angina pectoris: Secondary | ICD-10-CM | POA: Diagnosis not present

## 2015-07-17 DIAGNOSIS — Z79891 Long term (current) use of opiate analgesic: Secondary | ICD-10-CM | POA: Diagnosis not present

## 2015-07-17 DIAGNOSIS — J45909 Unspecified asthma, uncomplicated: Secondary | ICD-10-CM | POA: Diagnosis not present

## 2015-07-17 DIAGNOSIS — M19019 Primary osteoarthritis, unspecified shoulder: Secondary | ICD-10-CM | POA: Diagnosis not present

## 2015-07-17 DIAGNOSIS — Z9884 Bariatric surgery status: Secondary | ICD-10-CM | POA: Diagnosis not present

## 2015-07-17 DIAGNOSIS — I10 Essential (primary) hypertension: Secondary | ICD-10-CM | POA: Diagnosis not present

## 2015-07-17 DIAGNOSIS — Z471 Aftercare following joint replacement surgery: Secondary | ICD-10-CM | POA: Diagnosis not present

## 2015-07-17 DIAGNOSIS — Z96652 Presence of left artificial knee joint: Secondary | ICD-10-CM | POA: Diagnosis not present

## 2015-07-23 DIAGNOSIS — I2510 Atherosclerotic heart disease of native coronary artery without angina pectoris: Secondary | ICD-10-CM | POA: Diagnosis not present

## 2015-07-23 DIAGNOSIS — Z96651 Presence of right artificial knee joint: Secondary | ICD-10-CM | POA: Diagnosis not present

## 2015-07-23 DIAGNOSIS — J45909 Unspecified asthma, uncomplicated: Secondary | ICD-10-CM | POA: Diagnosis not present

## 2015-07-23 DIAGNOSIS — Z7984 Long term (current) use of oral hypoglycemic drugs: Secondary | ICD-10-CM | POA: Diagnosis not present

## 2015-07-23 DIAGNOSIS — Z471 Aftercare following joint replacement surgery: Secondary | ICD-10-CM | POA: Diagnosis not present

## 2015-07-23 DIAGNOSIS — I10 Essential (primary) hypertension: Secondary | ICD-10-CM | POA: Diagnosis not present

## 2015-07-23 DIAGNOSIS — J329 Chronic sinusitis, unspecified: Secondary | ICD-10-CM | POA: Diagnosis not present

## 2015-07-23 DIAGNOSIS — Z9884 Bariatric surgery status: Secondary | ICD-10-CM | POA: Diagnosis not present

## 2015-07-23 DIAGNOSIS — E119 Type 2 diabetes mellitus without complications: Secondary | ICD-10-CM | POA: Diagnosis not present

## 2015-07-23 DIAGNOSIS — M19019 Primary osteoarthritis, unspecified shoulder: Secondary | ICD-10-CM | POA: Diagnosis not present

## 2015-07-23 DIAGNOSIS — Z79891 Long term (current) use of opiate analgesic: Secondary | ICD-10-CM | POA: Diagnosis not present

## 2015-07-23 DIAGNOSIS — Z96652 Presence of left artificial knee joint: Secondary | ICD-10-CM | POA: Diagnosis not present

## 2015-07-25 DIAGNOSIS — G4733 Obstructive sleep apnea (adult) (pediatric): Secondary | ICD-10-CM | POA: Diagnosis not present

## 2015-07-31 ENCOUNTER — Ambulatory Visit: Payer: PPO | Attending: Orthopedic Surgery

## 2015-07-31 DIAGNOSIS — R262 Difficulty in walking, not elsewhere classified: Secondary | ICD-10-CM

## 2015-07-31 DIAGNOSIS — M25562 Pain in left knee: Secondary | ICD-10-CM | POA: Diagnosis not present

## 2015-07-31 DIAGNOSIS — Z4789 Encounter for other orthopedic aftercare: Secondary | ICD-10-CM | POA: Diagnosis not present

## 2015-07-31 NOTE — Therapy (Signed)
[place] [facility] [address], [name], [address] Phone: [phone]   Fax:  [phone]  Physical Therapy Evaluation  Patient Details  Name: Andrew Cobb MRN: [id number] Date of Birth: [birth year] Referring Provider: Gaynelle Arabian, MD  Encounter Date: 07/31/2015      PT End of Session - 07/31/15 0859    Visit Number 1   Number of Visits 9   Date for PT Re-Evaluation 08/30/15   Authorization Type 1   Authorization Time Period of 10   PT Start Time 0900   PT Stop Time 0956   PT Time Calculation (min) 56 min   Activity Tolerance Patient tolerated treatment well;Patient limited by pain   Behavior During Therapy [facility] for tasks assessed/performed      Past Medical History  Diagnosis Date  . Hypertension   . Diabetes mellitus without complication ([place])   . Stented coronary artery   . MRSA (methicillin resistant Staphylococcus aureus) infection     07/30/2008 thru 08/07/2008  . Coronary artery disease     stent placement / 2  . Sleep apnea     05/16/2014 uses bipap  . 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 Arthritis   . Wears glasses     Past Surgical History  Procedure Laterality Date  . Cholecystectomy      2006  . Gastric bypass      10/05/2012  . Angioplasty    . Hernia repair      left inguinal 1981  . Appendectomy      1966  . Joint replacement      right knee 04/2006  . Functional endoscopic sinus surgery      11/10/2013  . Left knee meniscal tear       01/25/2010  . Right shoulder replacement       01/27/2006  . Left rotator cuff repair       05/03/2003   . Left ankle surgery       05/03/2003   . Right ankle surgery       fracture has 2 screws 07/07/1997  . Left knee meniscal tear repair       05/04/1996  . Angioplasty      with stent 04/07/1995  . Left carpel tunnel       09/18/1993  . Right carpel tunnel        05/16/1992  . Cardiovascular stress test      07/31/2011  . Colonscopy       08/25/2012  . Total knee arthroplasty Left 07/13/2015    Procedure: LEFT TOTAL KNEE ARTHROPLASTY;  Surgeon: Gaynelle Arabian, MD;  Location: [facility];  Service: Orthopedics;  Laterality: Left;    There were no vitals filed for this visit.  Visit Diagnosis:  Orthopedic aftercare - Plan: PT plan of care cert/re-cert  Difficulty walking - Plan: PT plan of care cert/re-cert  Knee pain, left - Plan: PT plan of care cert/re-cert      Subjective Assessment - 07/31/15 0904    Subjective L knee pain: 6/10 currently, 4/10 at best, 7/10 at worst (at night)    Pertinent History L TKA on 07/13/2015. Had R TKA 2007 and was trying to put off L knee surgery for years. [facility]. Was doing 2 miles a day or more and L knee continued to bother him due to  arthritis. Was only on his rw for 2 days and transitioned to a SPC. Currently has difficulty getting comfortable and walking. Had inpatient and home health PT.    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    Currently in Pain? Yes   Pain Score 6    Pain Location Knee   Pain Orientation Left   Pain Descriptors / Indicators Aching  "wrenching pain"   Pain Type Surgical pain   Aggravating Factors  bending, straightening L knee   Multiple Pain Sites No            [facility] PT Assessment - 07/31/15 0915    Assessment   Medical Diagnosis L TKA   Referring Provider Gaynelle Arabian, MD   Onset Date/Surgical Date 07/13/15   Prior Therapy inpatient and home health PT   Precautions   Precaution Comments WBAT   Restrictions   Other Position/Activity Restrictions WBAT   Balance Screen   Has the patient fallen in the past 6 months No   Has the patient had a decrease in activity level because of a fear of falling?  No   Is the patient reluctant to  leave their home because of a fear of falling?  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 Scab formation with some redness. No abnormal warmth.  Normal to minimal swelling. Pt education not to touch his knee with his hands to prevent infection (pt observed rubbing his L knee with his hands secondary to discomfort).    Posture/Postural Control   Posture Comments R lateral lean, decreased weight bearing L LE, L knee slightly flexed, decreased bilateral hip extension   AROM   Right Knee Extension 0   Right Knee Flexion 127  over 127 degrees   Left Knee Extension -17   Left Knee Flexion 113   Strength   Overall Strength Comments Manual resistance not performed secondary to pt being only 65 days since surgery   Palpation   Palpation comment decreased soft tissue mobility inferior 1/2 of  L knee   Ambulation/Gait   Gait Comments SPC on R side, antalgic, decreased stance L LE, R lateral lean, decreased L knee extension during stance phase, decreased L knee flexion during swing phase      Objectives:  Manual therapy  Supine with L leg propped on pillow: soft tissue mobilization to hamstrings to promote knee extension ROM   There-ex  Pt observed to rub his knee with his hand due to discomfort. Pt education in not doing so to decrease risk of infection. Pt verbalized understanding but needed consistent cues.   Pt education on trying to sleep on his back if able, with L leg propped on pillow to promote knee extension secondary to sleeping on his side with knees flexed. Pt verbalized understanding.   Seated knee flexion/extension assisted with physioball 10x5 seconds each way to promote ROM  Standing heel toe raises 10x3   Standing L TKE 5 x 5 seconds to promote knee extension.    Standing ankle DF/PF on rockerboard 2 min (to promote gastroc and knee extension mobility).   Improved exercise technique, movement at target joints, use of target muscles after mod verbal, visual,  tactile cues.                  PT Education - August 10, 2015 1047    Education provided Yes   Education Details ther-ex, plan of care   Person(s) Educated Patient   Methods Explanation;Demonstration;Tactile cues;Verbal cues   Comprehension Verbalized understanding;Returned demonstration;Verbal cues required             PT Long Term Goals - 08/10/2015 1052    PT LONG TERM GOAL #1   Title Patient will improve his L knee extension AROM to at least -5 degrees to improve ability to ambulate and improve gait pattern.    Baseline -17 degrees seated L knee extension   Time 4   Period Weeks   Status New   PT LONG TERM GOAL #2   Title Patient will improve L knee flexion AROM to at least 125 degrees to promote proper gait pattern, and ability to negotiate stairs when appropriate.    Baseline 113 degrees seated L knee flexion   Time 4   Period Weeks   Status New   PT LONG TERM GOAL #3   Title Patient will be able to ambulate independently at least 500 ft without LOB to promote mobility.    Baseline Patient currently ambulates with SPC.   Time 4   Period Weeks   Status New   PT LONG TERM GOAL #4   Title Patient will have at least 4+/5 L knee flexion and extension strength to promote ability to negotiate stairs when appropriate.    Baseline Manual resistance not performed secondary to pt being only 18 days  since surgery.    Time 4   Period Weeks   Status New               Plan - August 10, 2015 1048    Clinical Impression Statement Patient is a 65 year old male who came to physical therapy S/P L TKA on 07/13/2015. He also presents with L knee pain, limited knee flexion and extension AROM, stiffness, decreased soft tissue mobility, altered gait pattern and posture, and difficulty  with performing functional tasks such as walking. Patient will benefit from skilled physical therapy services to address the aforementioned deficits.    Pt will benefit from skilled therapeutic intervention in order to improve on the following deficits Decreased scar mobility;Pain;Decreased range of motion;Difficulty walking;Decreased strength;Abnormal gait   Rehab Potential Good   Clinical Impairments Affecting Rehab Potential healing time, age   PT Frequency 2x / week   PT Duration 4 weeks   PT Treatment/Interventions Manual techniques;Therapeutic exercise;Therapeutic activities;Patient/family education;Neuromuscular re-education;Aquatic Therapy;Gait training   PT Next Visit Plan ROM, manual therapy, gait, functional strengthening   Consulted and Agree with Plan of Care Patient          G-Codes - Aug 10, 2015 1020    Functional Assessment Tool Used clinical presentation, patient interview   Functional Limitation Mobility: Walking and moving around   Mobility: Walking and Moving Around Current Status [phone]) At least 60 percent but less than 80 percent impaired, limited or restricted   Mobility: Walking and Moving Around Goal Status [phone]) At least 20 percent but less than 40 percent impaired, limited or restricted       Problem List Patient Active Problem List   Diagnosis Date Noted  . OA (osteoarthritis) of knee 07/13/2015  . Bradycardia 05/28/2015  . Annual physical exam 04/24/2015  . Essential hypertension 04/24/2015  . Type 2 diabetes mellitus without complication, without long-term current  use of insulin ([place]) 04/24/2015  . Need for influenza vaccination 04/24/2015  . SINUSITIS, CHRONIC 08/13/2009  . DERMATITIS DUE DRUGS&MEDICINES TAKEN INTERNALLY 08/13/2009  . COUGH 05/21/2009  . METHICILLIN SUSCEPTIBLE STAPH INF CCE & UNS SITE 04/02/2009  . METHICILLIN RESISTANT STAPHYLOCOCCUS AUREUS INFECTION 04/02/2009  . DIABETES MELLITUS, TYPE II 04/02/2009  . HYPERLIPIDEMIA 04/02/2009   . CAD 04/02/2009  . ASTHMA 04/02/2009  . HYPERTROPHY PROSTATE W/UR OBST & OTH LUTS 04/02/2009  . BOILS, RECURRENT 04/02/2009  . 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 DEGENERATIVE JOINT DISEASE, KNEES, 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  . SLEEP APNEA 04/02/2009    Thank you for your referral.  Joneen Boers PT, DPT   07/31/2015, 11:08 AM  [place] [facility] [address] [name], [address] Phone: [phone]   Fax:  [phone]  Name: JAMORIE NOGUEZ MRN: [id number] Date of Birth: Nov 13, 1950

## 2015-08-02 ENCOUNTER — Ambulatory Visit: Payer: PPO

## 2015-08-02 DIAGNOSIS — Z4789 Encounter for other orthopedic aftercare: Secondary | ICD-10-CM

## 2015-08-02 DIAGNOSIS — R262 Difficulty in walking, not elsewhere classified: Secondary | ICD-10-CM

## 2015-08-02 DIAGNOSIS — M25562 Pain in left knee: Secondary | ICD-10-CM

## 2015-08-02 NOTE — Therapy (Signed)
[place] [facility] [address] [name], [address] Phone: [phone]   Fax:  [phone]  Physical Therapy Treatment  Patient Details  Name: Andrew Cobb MRN: [id number] Date of Birth: [birth year] Referring Provider: Gaynelle Arabian, MD  Encounter Date: 08/02/2015      PT End of Session - 08/02/15 1007    Visit Number 2   Number of Visits 9   Date for PT Re-Evaluation 08/30/15   Authorization Type 2   Authorization Time Period of 10   PT Start Time 1007   PT Stop Time 1109   PT Time Calculation (min) 62 min   Activity Tolerance Patient tolerated treatment well;Patient limited by pain   Behavior During Therapy [facility] for tasks assessed/performed      Past Medical History  Diagnosis Date  . Hypertension   . Diabetes mellitus without complication ([place])   . Stented coronary artery   . MRSA (methicillin resistant Staphylococcus aureus) infection     07/30/2008 thru 08/07/2008  . Coronary artery disease     stent placement / 2  . Sleep apnea     05/16/2014 uses bipap  . 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 Arthritis   . Wears glasses     Past Surgical History  Procedure Laterality Date  . Cholecystectomy      2006  . Gastric bypass      10/05/2012  . Angioplasty    . Hernia repair      left inguinal 1981  . Appendectomy      1966  . Joint replacement      right knee 04/2006  . Functional endoscopic sinus surgery      11/10/2013  . Left knee meniscal tear       01/25/2010  . Right shoulder replacement       01/27/2006  . Left rotator cuff repair       05/03/2003   . Left ankle surgery       05/03/2003   . Right ankle surgery       fracture has 2 screws 07/07/1997  . Left knee meniscal tear repair       05/04/1996  . Angioplasty      with stent 04/07/1995  . Left carpel tunnel       09/18/1993  . Right carpel tunnel      05/16/1992  . Cardiovascular stress test      07/31/2011  . Colonscopy       08/25/2012  . Total knee arthroplasty Left 07/13/2015    Procedure: LEFT TOTAL KNEE ARTHROPLASTY;  Surgeon: Gaynelle Arabian, MD;  Location: [facility];  Service: Orthopedics;  Laterality: Left;    There were no vitals filed for this visit.  Visit Diagnosis:  Orthopedic aftercare  Difficulty walking  Knee pain, left      Subjective Assessment - 08/02/15 1010    Subjective Was able to sleep 5 hours straight yesterday. 75% of my pain is at the outside hamstring.    Pertinent History L TKA on 07/13/2015. Had R TKA 2007 and was trying to put off L knee surgery for years. [facility]. Was doing 2 miles a day or more and L knee continued to bother him due to arthritis. Was only on his rw for 2 days and transitioned to a SPC. Currently has difficulty getting comfortable  and walking. Had inpatient and home health PT.    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    Currently in Pain? Yes   Pain Score 5             Objectives:  114 degrees L knee flexion AROM seated prior to manual therapy  Manual therapy  Supine with L leg propped on pillow: soft tissue mobilization to hamstrings, and vastus lateralis to promote knee extension ROM Seated PROM L knee flexion      Followed by [place] to end range 10x3      There-ex  Directed patient with supine quad set with leg propped on pillow 10x2 with 5 second holds   Standing heel toe raises 10x3   Seated knee extension assisted with physioball 10x5 seconds for 3 sets to promote ROM  Standing L TKE 5 x 5 seconds to promote knee extension,  Standing ankle DF/PF on rockerboard 2 min (to promote gastroc and knee extension mobility).  Standing L hip abduction 10x2 with 5 second holds  Improved exercise technique, movement at target  joints, use of target muscles after mod verbal, visual, tactile cues.     Ice to L knee in supine at end of session x 15 minutes    Improved L knee AROM to -10 degrees L knee extension and 120 degrees L knee flexion in sitting after manual therapy.                      PT Education - 08/02/15 1046    Education provided Yes   Education Details ther-ex   [employer]) Educated Patient   Methods Explanation;Demonstration;Tactile cues;Verbal cues   Comprehension Verbalized understanding;Returned demonstration             PT Long Term Goals - 07/31/15 1052    PT LONG TERM GOAL #1   Title Patient will improve his L knee extension AROM to at least -5 degrees to improve ability to ambulate and improve gait pattern.    Baseline -17 degrees seated L knee extension   Time 4   Period Weeks   Status New   PT LONG TERM GOAL #2   Title Patient will improve L knee flexion AROM to at least 125 degrees to promote proper gait pattern, and ability to negotiate stairs when appropriate.    Baseline 113 degrees seated L knee flexion   Time 4   Period Weeks   Status New   PT LONG TERM GOAL #3   Title Patient will be able to ambulate independently at least 500 ft without LOB to promote mobility.    Baseline Patient currently ambulates with SPC.   Time 4   Period Weeks   Status New   PT LONG TERM GOAL #4   Title Patient will have at least 4+/5 L knee flexion and extension strength to promote ability to negotiate stairs when appropriate.    Baseline Manual resistance not performed secondary to pt being only 18 days  since surgery.    Time 4   Period Weeks   Status New               Plan - 08/02/15 1005    Clinical Impression Statement Improved L knee AROM to -10 degrees L knee extension and 120 degrees L knee flexion in sitting after manual therapy.    Pt will  benefit from skilled therapeutic intervention in order to improve on the following deficits Decreased scar  mobility;Pain;Decreased range of motion;Difficulty walking;Decreased strength;Abnormal gait   Rehab Potential Good   Clinical Impairments Affecting Rehab Potential healing time, age   PT Frequency 2x / week   PT Duration 4 weeks   PT Treatment/Interventions Manual techniques;Therapeutic exercise;Therapeutic activities;Patient/family education;Neuromuscular re-education;Aquatic Therapy;Gait training   PT Next Visit Plan ROM, manual therapy, gait, functional strengthening   Consulted and Agree with Plan of Care Patient        Problem List Patient Active Problem List   Diagnosis Date Noted  . OA (osteoarthritis) of knee 07/13/2015  . Bradycardia 05/28/2015  . Annual physical exam 04/24/2015  . Essential hypertension 04/24/2015  . Type 2 diabetes mellitus without complication, without long-term current use of insulin ([place]) 04/24/2015  . Need for influenza vaccination 04/24/2015  . SINUSITIS, CHRONIC 08/13/2009  . DERMATITIS DUE DRUGS&MEDICINES TAKEN INTERNALLY 08/13/2009  . COUGH 05/21/2009  . METHICILLIN SUSCEPTIBLE STAPH INF CCE & UNS SITE 04/02/2009  . METHICILLIN RESISTANT STAPHYLOCOCCUS AUREUS INFECTION 04/02/2009  . DIABETES MELLITUS, TYPE II 04/02/2009  . HYPERLIPIDEMIA 04/02/2009  . CAD 04/02/2009  . ASTHMA 04/02/2009  . HYPERTROPHY PROSTATE W/UR OBST & OTH LUTS 04/02/2009  . BOILS, RECURRENT 04/02/2009  . 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 DEGENERATIVE JOINT DISEASE, KNEES, 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  . SLEEP APNEA 04/02/2009    Joneen Boers PT, DPT   08/02/2015, 2:01 PM  [place] [facility] [address] [name], [address] Phone: [phone]   Fax:  [phone]  Name: SERGI PICKRON MRN: [id number] Date of Birth: [birth year]

## 2015-08-07 ENCOUNTER — Ambulatory Visit: Payer: PPO

## 2015-08-07 DIAGNOSIS — Z4789 Encounter for other orthopedic aftercare: Secondary | ICD-10-CM

## 2015-08-07 DIAGNOSIS — M25562 Pain in left knee: Secondary | ICD-10-CM

## 2015-08-07 DIAGNOSIS — R262 Difficulty in walking, not elsewhere classified: Secondary | ICD-10-CM

## 2015-08-07 NOTE — Therapy (Signed)
[facility] [address] [name], [address] Phone: [phone]   Fax:  [phone]  Physical Therapy Treatment  Patient Details  Name: Andrew Cobb MRN: [id number] Date of Birth: 20-Jul-1950 Referring Provider: Gaynelle Arabian, MD  Encounter Date: 08/07/2015      PT End of Session - 08/07/15 1048    Visit Number 3   Number of Visits 9   Date for PT Re-Evaluation 08/30/15   Authorization Type 3   Authorization Time Period of 10   PT Start Time 1048   PT Stop Time 1123   PT Time Calculation (min) 35 min   Activity Tolerance Patient tolerated treatment well;Patient limited by pain   Behavior During Therapy [facility] for tasks assessed/performed      Past Medical History  Diagnosis Date  . Hypertension   . Diabetes mellitus without complication ([place])   . Stented coronary artery   . MRSA (methicillin resistant Staphylococcus aureus) infection     07/30/2008 thru 08/07/2008  . Coronary artery disease     stent placement / 2  . Sleep apnea     05/16/2014 uses bipap  . 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 Arthritis   . Wears glasses     Past Surgical History  Procedure Laterality Date  . Cholecystectomy      2006  . Gastric bypass      10/05/2012  . Angioplasty    . Hernia repair      left inguinal 1981  . Appendectomy      1966  . Joint replacement      right knee 04/2006  . Functional endoscopic sinus surgery      11/10/2013  . Left knee meniscal tear       01/25/2010  . Right shoulder replacement       01/27/2006  . Left rotator cuff repair       05/03/2003   . Left ankle surgery       05/03/2003   . Right ankle surgery       fracture has 2 screws 07/07/1997  . Left knee meniscal tear repair       05/04/1996  . Angioplasty      with stent 04/07/1995  . Left carpel tunnel       09/18/1993  . Right carpel tunnel      05/16/1992  . Cardiovascular stress test      07/31/2011  . Colonscopy       08/25/2012  . Total knee arthroplasty Left 07/13/2015    Procedure: LEFT TOTAL KNEE ARTHROPLASTY;  Surgeon: Gaynelle Arabian, MD;  [facility]: [facility];  Service: Orthopedics;  Laterality: Left;    There were no vitals filed for this visit.  Visit Diagnosis:  Orthopedic aftercare  Difficulty walking  Knee pain, left      Subjective Assessment - 08/07/15 1053    Subjective Pt states that his L knee looks better today compared to last week when his doctor saw his knee. Still has a little seeping. Pt adds that he got 7 hours sleep last night. Pt states no fever, chills, nausea or vomiting.    Pertinent History L TKA on 07/13/2015. Had R TKA 2007 and was trying to put off L knee surgery for years. [facility]. Was doing 2 miles a day or more and L knee  continued to bother him due to arthritis. Was only on his rw for 2 days and transitioned to a SPC. Currently has difficulty getting comfortable and walking. Had inpatient and home health PT.    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    Currently in Pain? Yes   Pain Score 4    Multiple Pain Sites No        Objectives:   -12 degrees seated L knee extension, 115 degrees seated L knee flexion at beginning of session.     Manual therapy  Supine with L leg propped on pillow: soft tissue mobilization to hamstrings, and vastus lateralis to promote knee extension ROM  There-ex Seated L knee flexion AAROM to end range 10x3 Ankle DF/PF on rocker board x 2 min Standing L hip abduction 10x5 seconds for 2 sets  Improved exercise technique, movement at target joints, use of target muscles after mod verbal, visual, tactile cues.     Seated L knee flexion AROM improved to 120 degrees. No abnormal warmth. Scab above and below knee covered  in band-aid. Some redness L knee. Observed scabs: superior scab yellowish white; inferior scab, yellowish white border. Pt was recommended to contact MD. Pt verbalized understanding. Surgeon's (Dr. Juanda Bond) office was contacted via phone, talked to receptionist (Tammy) and left message for MD or PA or nurse pertaining to current status of knee healing/scab.                            PT Education - 08/07/15 1139    Education provided Yes   Education Details ther-ex   [employer]) Educated Patient   Methods Explanation;Demonstration;Tactile cues;Verbal cues   Comprehension Verbalized understanding;Returned demonstration             PT Long Term Goals - 07/31/15 1052    PT LONG TERM GOAL #1   Title Patient will improve his L knee extension AROM to at least -5 degrees to improve ability to ambulate and improve gait pattern.    Baseline -17 degrees seated L knee extension   Time 4   Period Weeks   Status New   PT LONG TERM GOAL #2   Title Patient will improve L knee flexion AROM to at least 125 degrees to promote proper gait pattern, and ability to negotiate stairs when appropriate.    Baseline 113 degrees seated L knee flexion   Time 4   Period Weeks   Status New   PT LONG TERM GOAL #3   Title Patient will be able to ambulate independently at least 500 ft without LOB to promote mobility.    Baseline Patient currently ambulates with SPC.   Time 4   Period Weeks   Status New   PT LONG TERM GOAL #4   Title Patient will have at least 4+/5 L knee flexion and extension strength to promote ability to negotiate stairs when appropriate.    Baseline Manual resistance not performed secondary to pt being only 18 days  since surgery.    Time 4   Period Weeks   Status New               Plan - 08/07/15 1047    Clinical Impression Statement Seated L knee flexion AROM improved to 120 degrees. No abnormal warmth. Scab above and below knee covered in band-aid. Some  redness L knee. Observed scabs: superior scab yellowish white; inferior scab, yellowish white border. Pt was recommended to contact MD. Pt verbalized understanding. Surgeon's (Dr. Juanda Bond) office was contacted via phone, talked to receptionist (Tammy) and left message for MD or PA or nurse pertaining to current status of knee healing/scab.    Pt will benefit from skilled therapeutic intervention in order to improve on the following deficits Decreased scar mobility;Pain;Decreased range of motion;Difficulty walking;Decreased strength;Abnormal gait   Rehab Potential Good   Clinical Impairments Affecting Rehab Potential healing time, age   PT Frequency 2x / week   PT Duration 4 weeks   PT Treatment/Interventions Manual techniques;Therapeutic exercise;Therapeutic activities;Patient/family education;Neuromuscular re-education;Aquatic Therapy;Gait training   PT Next Visit Plan ROM, manual therapy, gait, functional strengthening   Consulted and Agree with Plan of Care Patient        Problem List Patient Active Problem List   Diagnosis Date Noted  . OA (osteoarthritis) of knee 07/13/2015  . Bradycardia 05/28/2015  . Annual physical exam 04/24/2015  . Essential hypertension 04/24/2015  . Type 2 diabetes mellitus without complication, without long-term current use of insulin ([place]) 04/24/2015  . Need for influenza vaccination 04/24/2015  . SINUSITIS, CHRONIC 08/13/2009  . DERMATITIS DUE DRUGS&MEDICINES TAKEN INTERNALLY 08/13/2009  . COUGH 05/21/2009  . METHICILLIN SUSCEPTIBLE STAPH INF CCE & UNS SITE 04/02/2009  . METHICILLIN RESISTANT STAPHYLOCOCCUS AUREUS INFECTION 04/02/2009  . DIABETES MELLITUS, TYPE II 04/02/2009  . HYPERLIPIDEMIA 04/02/2009  . CAD 04/02/2009  . ASTHMA 04/02/2009  . HYPERTROPHY PROSTATE W/UR OBST & OTH LUTS 04/02/2009  . BOILS, RECURRENT 04/02/2009  . 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 DEGENERATIVE JOINT DISEASE, KNEES, 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  . SLEEP  APNEA 04/02/2009    Joneen Boers PT, DPT   08/07/2015, 8:41 PM  [facility] [address] [name], [address] Phone: [phone]   Fax:  [phone]  Name: Andrew Cobb MRN: [id number] Date of Birth: [birth year]

## 2015-08-09 ENCOUNTER — Ambulatory Visit: Payer: PPO | Attending: Orthopedic Surgery

## 2015-08-09 DIAGNOSIS — R262 Difficulty in walking, not elsewhere classified: Secondary | ICD-10-CM

## 2015-08-09 DIAGNOSIS — Z4789 Encounter for other orthopedic aftercare: Secondary | ICD-10-CM

## 2015-08-09 DIAGNOSIS — M25562 Pain in left knee: Secondary | ICD-10-CM

## 2015-08-09 NOTE — Therapy (Signed)
[facility] [address], [name], [address] Phone: [phone]   Fax:  [phone]  Physical Therapy Note  Patient Details  Name: Andrew Cobb MRN: [id number] Date of Birth: [birth year] Referring Provider: Gaynelle Arabian, MD  Encounter Date: 08/09/2015      PT End of Session - 08/09/15 1112    Visit Number --   Number of Visits 9   Date for PT Re-Evaluation 08/30/15   Authorization Type --   Authorization Time Period of 10   PT Start Time 1107   PT Stop Time 1116   PT Time Calculation (min) 9 min   Activity Tolerance Patient tolerated treatment well;Patient limited by pain   Behavior During Therapy [facility] for tasks assessed/performed      Past Medical History  Diagnosis Date  . Hypertension   . Diabetes mellitus without complication ([place])   . Stented coronary artery   . MRSA (methicillin resistant Staphylococcus aureus) infection     07/30/2008 thru 08/07/2008  . Coronary artery disease     stent placement / 2  . Sleep apnea     05/16/2014 uses bipap  . 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 Arthritis   . Wears glasses     Past Surgical History  Procedure Laterality Date  . Cholecystectomy      2006  . Gastric bypass      10/05/2012  . Angioplasty    . Hernia repair      left inguinal 1981  . Appendectomy      1966  . Joint replacement      right knee 04/2006  . Functional endoscopic sinus surgery      11/10/2013  . Left knee meniscal tear       01/25/2010  . Right shoulder replacement       01/27/2006  . Left rotator cuff repair       05/03/2003   . Left ankle surgery       05/03/2003   . Right ankle surgery       fracture has 2 screws 07/07/1997  . Left knee meniscal tear repair       05/04/1996  . Angioplasty      with stent 04/07/1995  . Left carpel tunnel       09/18/1993  . Right carpel tunnel       05/16/1992   . Cardiovascular stress test      07/31/2011  . Colonscopy       08/25/2012  . Total knee arthroplasty Left 07/13/2015    Procedure: LEFT TOTAL KNEE ARTHROPLASTY;  Surgeon: Gaynelle Arabian, MD;  [facility];  Service: Orthopedics;  Laterality: Left;    There were no vitals filed for this visit.  Visit Diagnosis:  Orthopedic aftercare  Difficulty walking  Knee pain, left      Subjective Assessment - 08/09/15 1112    Subjective Pt went to the doctor yesterday, MD removed some stitches (ingrown), gave pt antibiotics for infection. Has a follow-up appointment with MD tomorrow 10 am.    Pertinent History L TKA on 07/13/2015. Had R TKA 2007 and was trying to put off L knee surgery for years. [facility]. Was doing 2 miles a day or more and L knee continued to bother him due to arthritis. Was only on his rw for 2 days  and transitioned to a SPC. Currently has difficulty getting comfortable and walking. Had inpatient and home health PT.    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    Currently in Pain? Yes   Pain Score 4             PT Education - 08/09/15 1121    Education provided Yes   Education Details plan of care   Person(s) Educated Patient   Methods Explanation   Comprehension Verbalized understanding             PT Long Term Goals - 07/31/15 1052    PT LONG TERM GOAL #1   Title Patient will improve his L knee extension AROM to at least -5 degrees to improve ability to ambulate and improve gait pattern.    Baseline -17 degrees seated L knee extension   Time 4   Period Weeks   Status New   PT LONG TERM GOAL #2   Title Patient will improve L knee flexion AROM to at least 125 degrees to promote proper gait pattern, and ability to negotiate stairs when appropriate.    Baseline 113 degrees seated L knee flexion   Time 4   Period  Weeks   Status New   PT LONG TERM GOAL #3   Title Patient will be able to ambulate independently at least 500 ft without LOB to promote mobility.    Baseline Patient currently ambulates with SPC.   Time 4   Period Weeks   Status New   PT LONG TERM GOAL #4   Title Patient will have at least 4+/5 L knee flexion and extension strength to promote ability to negotiate stairs when appropriate.    Baseline Manual resistance not performed secondary to pt being only 18 days  since surgery.    Time 4   Period Weeks   Status New               Plan - 08/09/15 1114    Clinical Impression Statement Physical therapy held off for about 1 week to allow for healing unless otherwise noted by MD.    Pt will benefit from skilled therapeutic intervention in order to improve on the following deficits Decreased scar mobility;Pain;Decreased range of motion;Difficulty walking;Decreased strength;Abnormal gait   Rehab Potential Good   Clinical Impairments Affecting Rehab Potential healing time, age   PT Frequency 2x / week   PT Duration 4 weeks   PT Treatment/Interventions Manual techniques;Therapeutic exercise;Therapeutic activities;Patient/family education;Neuromuscular re-education;Aquatic Therapy;Gait training   PT Next Visit Plan ROM, manual therapy, gait, functional strengthening   Consulted and Agree with Plan of Care Patient        Problem List Patient Active Problem List   Diagnosis Date Noted  . OA (osteoarthritis) of knee 07/13/2015  . Bradycardia 05/28/2015  . Annual physical exam 04/24/2015  . Essential hypertension 04/24/2015  . Type 2 diabetes mellitus without complication, without long-term current use of insulin ([place]) 04/24/2015  . Need for influenza vaccination 04/24/2015  . SINUSITIS, CHRONIC 08/13/2009  . DERMATITIS DUE DRUGS&MEDICINES TAKEN INTERNALLY 08/13/2009  . COUGH 05/21/2009  . METHICILLIN SUSCEPTIBLE STAPH INF CCE & UNS SITE 04/02/2009  . METHICILLIN RESISTANT  STAPHYLOCOCCUS AUREUS INFECTION 04/02/2009  . DIABETES MELLITUS, TYPE II 04/02/2009  . HYPERLIPIDEMIA 04/02/2009  . CAD 04/02/2009  . ASTHMA 04/02/2009  . HYPERTROPHY PROSTATE W/UR OBST & OTH LUTS  04/02/2009  . BOILS, RECURRENT 04/02/2009  . 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 DEGENERATIVE JOINT DISEASE, KNEES, 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  . SLEEP APNEA 04/02/2009    Joneen Boers PT, DPT   08/09/2015, 11:24 AM  [facility] [address], [name], [address] Phone: [phone]   Fax:  [phone]  Name: Andrew Cobb MRN: [id number] Date of Birth: 04-20-1951

## 2015-08-10 DIAGNOSIS — Z96652 Presence of left artificial knee joint: Secondary | ICD-10-CM | POA: Diagnosis not present

## 2015-08-10 DIAGNOSIS — Z471 Aftercare following joint replacement surgery: Secondary | ICD-10-CM | POA: Diagnosis not present

## 2015-08-14 ENCOUNTER — Ambulatory Visit: Payer: PPO

## 2015-08-15 DIAGNOSIS — R262 Difficulty in walking, not elsewhere classified: Secondary | ICD-10-CM | POA: Diagnosis not present

## 2015-08-15 DIAGNOSIS — Z4789 Encounter for other orthopedic aftercare: Secondary | ICD-10-CM | POA: Diagnosis not present

## 2015-08-15 DIAGNOSIS — M25562 Pain in left knee: Secondary | ICD-10-CM | POA: Diagnosis not present

## 2015-08-15 NOTE — Therapy (Signed)
[facility] [address], [name], [address] Phone: [phone]   Fax:  [phone]  Physical Therapy Discharge Summary  Patient Details  Name: Andrew Cobb MRN: [id number] Date of Birth: 12-01-1950 Referring Provider: Gaynelle Arabian, MD  Encounter Date: 08/15/2015    Past Medical History  Diagnosis Date  . Hypertension   . Diabetes mellitus without complication ([place])   . Stented coronary artery   . MRSA (methicillin resistant Staphylococcus aureus) infection     07/30/2008 thru 08/07/2008  . Coronary artery disease     stent placement / 2  . Sleep apnea     05/16/2014 uses bipap  . 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 Arthritis   . Wears glasses     Past Surgical History  Procedure Laterality Date  . Cholecystectomy      2006  . Gastric bypass      10/05/2012  . Angioplasty    . Hernia repair      left inguinal 1981  . Appendectomy      1966  . Joint replacement      right knee 04/2006  . Functional endoscopic sinus surgery      11/10/2013  . Left knee meniscal tear       01/25/2010  . Right shoulder replacement       01/27/2006  . Left rotator cuff repair       05/03/2003   . Left ankle surgery       05/03/2003   . Right ankle surgery       fracture has 2 screws 07/07/1997  . Left knee meniscal tear repair       05/04/1996  . Angioplasty      with stent 04/07/1995  . Left carpel tunnel       09/18/1993  . Right carpel tunnel       05/16/1992  . Cardiovascular stress test      07/31/2011  . Colonscopy       08/25/2012  . Total knee arthroplasty Left 07/13/2015    Procedure: LEFT TOTAL KNEE ARTHROPLASTY;  Surgeon: Gaynelle Arabian, MD;  [facility];  Service: Orthopedics;  Laterality: Left;    There were no vitals filed for this visit.  Visit Diagnosis:  Orthopedic aftercare  Difficulty walking  Knee pain,  left        PT Long Term Goals - 08/15/15 1105    PT LONG TERM GOAL #1   Title Patient will improve his L knee extension AROM to at least -5 degrees to improve ability to ambulate and improve gait pattern.    Baseline -17 degrees seated L knee extension; -12 degrees during most recent session   Time 4   Period Weeks   Status On-going   PT LONG TERM GOAL #2   Title Patient will improve L knee flexion AROM to at least 125 degrees to promote proper gait pattern, and ability to negotiate stairs when appropriate.    Baseline 113 degrees seated L knee flexion; 120 degrees most recent session   Time 4   Period Weeks   Status On-going   PT LONG TERM GOAL #3   Title Patient will be able to ambulate independently at least 500 ft without LOB to promote mobility.    Baseline Patient currently ambulates with SPC. Pt working towards independent ambulation   Time 4  Period Weeks   Status On-going   PT LONG TERM GOAL #4   Title Patient will have at least 4+/5 L knee flexion and extension strength to promote ability to negotiate stairs when appropriate.    Baseline Manual resistance not performed secondary to pt being only 18 days  since surgery.    Time 4   Period Weeks   Status Unable to assess               Plan - 09-05-15 1115    Clinical Impression Statement  Patient has demonstrated improved L knee extension AROM to -12 degrees and L knee flexion AROM to 120 degrees during most recent physical therapy session. Pt also working towards independent ambulation. Pt however had to return to MD secondary to infection and received treatment. Per patient, MD felt that he was doing well enough for physical therapy discharge. Skilled physical therapy services discharged.    Pt will benefit from skilled therapeutic intervention in order to improve on the following deficits Decreased scar mobility;Pain;Decreased range of motion;Difficulty walking;Decreased strength;Abnormal gait   Rehab  Potential Good   Clinical Impairments Affecting Rehab Potential healing time, age   PT Treatment/Interventions Manual techniques;Therapeutic exercise;Therapeutic activities;Patient/family education;Neuromuscular re-education;Aquatic Therapy;Gait training   PT Next Visit Plan --   Consulted and Agree with Plan of Care Patient          G-Codes - 2015-09-05 1107    Functional Assessment Tool Used clinical presentation, patient interview   Functional Limitation Mobility: Walking and moving around   Mobility: Walking and Moving Around Goal Status [phone]) At least 20 percent but less than 40 percent impaired, limited or restricted   Mobility: Walking and Moving Around Discharge Status [phone]) At least 40 percent but less than 60 percent impaired, limited or restricted      Problem List Patient Active Problem List   Diagnosis Date Noted  . OA (osteoarthritis) of knee 07/13/2015  . Bradycardia 05/28/2015  . Annual physical exam 04/24/2015  . Essential hypertension 04/24/2015  . Type 2 diabetes mellitus without complication, without long-term current use of insulin ([place]) 04/24/2015  . Need for influenza vaccination 04/24/2015  . SINUSITIS, CHRONIC 08/13/2009  . DERMATITIS DUE DRUGS&MEDICINES TAKEN INTERNALLY 08/13/2009  . COUGH 05/21/2009  . METHICILLIN SUSCEPTIBLE STAPH INF CCE & UNS SITE 04/02/2009  . METHICILLIN RESISTANT STAPHYLOCOCCUS AUREUS INFECTION 04/02/2009  . DIABETES MELLITUS, TYPE II 04/02/2009  . HYPERLIPIDEMIA 04/02/2009  . CAD 04/02/2009  . ASTHMA 04/02/2009  . HYPERTROPHY PROSTATE W/UR OBST & OTH LUTS 04/02/2009  . BOILS, RECURRENT 04/02/2009  . 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 DEGENERATIVE JOINT DISEASE, KNEES, 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  . SLEEP APNEA 04/02/2009   Thank you for your referral.   Joneen Boers PT, DPT   2015-09-05, 7:13 PM  [facility] [address], [name],  [address] Phone: [phone]   Fax:  [phone]  Name: Andrew Cobb MRN: [id number] Date of Birth: 1951-03-28

## 2015-09-11 ENCOUNTER — Ambulatory Visit: Payer: PPO | Admitting: Family Medicine

## 2015-09-12 ENCOUNTER — Ambulatory Visit (INDEPENDENT_AMBULATORY_CARE_PROVIDER_SITE_OTHER): Payer: PPO | Admitting: Family Medicine

## 2015-09-12 ENCOUNTER — Encounter: Payer: Self-pay | Admitting: Family Medicine

## 2015-09-12 VITALS — BP 120/70 | HR 60 | Temp 98.4°F | Resp 16 | Ht 70.5 in | Wt 196.2 lb

## 2015-09-12 DIAGNOSIS — I10 Essential (primary) hypertension: Secondary | ICD-10-CM

## 2015-09-12 DIAGNOSIS — E119 Type 2 diabetes mellitus without complications: Secondary | ICD-10-CM

## 2015-09-12 NOTE — Progress Notes (Signed)
Name: Andrew Cobb   MRN: [id number]    DOB: 27-Dec-1950   Date:09/12/2015       Progress Note  Subjective  Chief Complaint  Chief Complaint  Patient presents with  . Diabetes    A1C 07/05/2015 6.5   101-110    HPI Here for f/u of DM.  Too early by 3 weeks to get A1c done.  BSs running 100-110 with avg  BS of 106.   Most BSs are fasting and some are post prandial.  TAking all meds.  Feeling well.  No problem-specific assessment & plan notes found for this encounter.   Past Medical History  Diagnosis Date  . Hypertension   . Diabetes mellitus without complication ([place])   . Stented coronary artery   . MRSA (methicillin resistant Staphylococcus aureus) infection     07/30/2008 thru 08/07/2008  . Coronary artery disease     stent placement / 2  . Sleep apnea     05/16/2014 uses bipap  . 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 Arthritis   . Wears glasses     Past Surgical History  Procedure Laterality Date  . Cholecystectomy      2006  . Gastric bypass      10/05/2012  . Angioplasty    . Hernia repair      left inguinal 1981  . Appendectomy      1966  . Joint replacement      right knee 04/2006  . Functional endoscopic sinus surgery      11/10/2013  . Left knee meniscal tear       01/25/2010  . Right shoulder replacement       01/27/2006  . Left rotator cuff repair       05/03/2003   . Left ankle surgery       05/03/2003   . Right ankle surgery       fracture has 2 screws 07/07/1997  . Left knee meniscal tear repair       05/04/1996  . Angioplasty      with stent 04/07/1995  . Left carpel tunnel       09/18/1993  . Right carpel tunnel       05/16/1992  . Cardiovascular stress test      07/31/2011  . Colonscopy       08/25/2012  . Total knee arthroplasty Left 07/13/2015    Procedure: LEFT TOTAL KNEE ARTHROPLASTY;  Surgeon: Gaynelle Arabian, MD;  Location: [facility];  Service: Orthopedics;  Laterality: Left;   . Replacement total knee bilateral  07/13/2015    Family History  Problem Relation Age of Onset  . Cancer Mother     pancreatic  . Diabetes Mother   . Stroke Mother   . Heart disease Mother   . Heart disease Father   . Stroke Father   . Diabetes Father   . Cancer Sister   . Cancer Brother     lung  . Cancer Brother     Social History   Social History  . Marital Status: Married    Spouse Name: N/A  . Number of Children: N/A  . Years of Education: N/A   Occupational History  . Not on file.   Social History Main Topics  . Smoking status: Former Smoker -- 1.00 packs/day for 10 years    Types: Cigarettes    Quit date: 07/07/1984  . Smokeless  tobacco: Never Used     Comment: quit 1986  . Alcohol Use: No  . Drug Use: No  . Sexual Activity: Not on file   Other Topics Concern  . Not on file   Social History Narrative     Current outpatient prescriptions:  .  cetirizine (ZYRTEC) 10 MG tablet, Take 10 mg by mouth daily., Disp: , Rfl:  .  Cholecalciferol (VITAMIN D3) 5000 units CAPS, Take by mouth., Disp: , Rfl:  .  fluticasone (FLONASE) 50 MCG/ACT nasal spray, Place 2 sprays into both nostrils daily as needed for allergies., Disp: 48 g, Rfl: 3 .  glucose blood test strip, 1 each by Other route 2 (two) times daily. DX E11.9, Disp: 100 each, Rfl: 12 .  ibuprofen (ADVIL,MOTRIN) 600 MG tablet, Take 600 mg by mouth every 6 (six) hours as needed., Disp: , Rfl:  .  Lancets (ONETOUCH ULTRASOFT) lancets, 1 each by Other route 2 (two) times daily. Dx E11.9, Disp: 100 each, Rfl: 12 .  lisinopril (PRINIVIL,ZESTRIL) 10 MG tablet, Take 1 tablet (10 mg total) by mouth daily., Disp: 90 tablet, Rfl: 3 .  Magnesium 250 MG TABS, Take by mouth., Disp: , Rfl:  .  metFORMIN (GLUCOPHAGE) 500 MG tablet, Take 1 tablet (500 mg total) by mouth 2 (two) times daily with a meal., Disp: 180 tablet, Rfl: 3 .  methocarbamol (ROBAXIN) 500 MG tablet, Take 1 tablet (500 mg total) by mouth every 6 (six)  hours as needed for muscle spasms., Disp: 80 tablet, Rfl: 1 .  Multiple Vitamins-Minerals (MULTIVITAMIN PO), Take by mouth., Disp: , Rfl:  .  temazepam (RESTORIL) 15 MG capsule, , Disp: , Rfl:  .  traMADol (ULTRAM) 50 MG tablet, Take 1-2 tablets (50-100 mg total) by mouth every 6 (six) hours as needed for moderate pain., Disp: 80 tablet, Rfl: 1 .  valACYclovir (VALTREX) 500 MG tablet, Take 500 mg by mouth 2 (two) times daily., Disp: , Rfl:   Allergies  Allergen Reactions  . Sulfamethoxazole-Trimethoprim     REACTION: Drug rash  . Tetracyclines & Related Rash     Review of Systems  Constitutional: Negative for fever, chills, weight loss and malaise/fatigue.  HENT: Negative for hearing loss.   Eyes: Negative for blurred vision and double vision.  Respiratory: Negative for cough, shortness of breath and wheezing.   Cardiovascular: Negative for chest pain, palpitations and leg swelling.  Gastrointestinal: Negative for heartburn, nausea, vomiting, abdominal pain, diarrhea and constipation.  Genitourinary: Negative for dysuria, urgency and frequency.  Skin: Negative for rash.  Neurological: Negative for dizziness, tremors, weakness and headaches.      Objective  Filed Vitals:   09/12/15 1025 09/12/15 1051  BP: 113/72 120/70  Pulse: 55 60  Temp: 98.4 F (36.9 C)   Resp: 16   Height: 5' 10.5" (1.791 m)   Weight: 196 lb 3.2 oz (88.996 kg)     Physical Exam  Constitutional: He is oriented to person, place, and time and well-developed, well-nourished, and in no distress. No distress.  HENT:  Head: Normocephalic and atraumatic.  Eyes: Conjunctivae and EOM are normal. Pupils are equal, round, and reactive to light. No scleral icterus.  Neck: Normal range of motion. Carotid bruit is not present. No thyromegaly present.  Cardiovascular: Normal rate, regular rhythm and normal heart sounds.  Exam reveals no gallop and no friction rub.   No murmur heard. Pulmonary/Chest: Effort normal  and breath sounds normal. No respiratory distress. He has no wheezes. He has  no rales.  Musculoskeletal: He exhibits no edema.  Lymphadenopathy:    He has no cervical adenopathy.  Neurological: He is alert and oriented to person, place, and time.  Vitals reviewed.      Recent Results (from the past 2160 hour(s))  Urinalysis, Routine w reflex microscopic (not at [facility])     Status: None   Collection Time: 07/05/15 10:02 AM  Result Value Ref Range   Color, Urine YELLOW YELLOW   APPearance CLEAR CLEAR   Specific Gravity, Urine 1.018 1.005 - 1.030   pH 5.5 5.0 - 8.0   Glucose, UA NEGATIVE NEGATIVE mg/dL   Hgb urine dipstick NEGATIVE NEGATIVE   Bilirubin Urine NEGATIVE NEGATIVE   Ketones, ur NEGATIVE NEGATIVE mg/dL   Protein, ur NEGATIVE NEGATIVE mg/dL   Nitrite NEGATIVE NEGATIVE   Leukocytes, UA NEGATIVE NEGATIVE    Comment: MICROSCOPIC NOT DONE ON URINES WITH NEGATIVE PROTEIN, BLOOD, LEUKOCYTES, NITRITE, OR GLUCOSE <1000 mg/dL.  Surgical pcr screen     Status: None   Collection Time: 07/05/15 10:03 AM  Result Value Ref Range   MRSA, PCR NEGATIVE NEGATIVE   Staphylococcus aureus NEGATIVE NEGATIVE    Comment:        The Xpert SA Assay (FDA approved for NASAL specimens in patients over 49 years of age), is one component of a comprehensive surveillance program.  Test performance has been validated by [facility] for patients greater than or equal to 7 year old. It is not intended to diagnose infection nor to guide or monitor treatment.   APTT     Status: None   Collection Time: 07/05/15 10:35 AM  Result Value Ref Range   aPTT 28 24 - 37 seconds  CBC     Status: None   Collection Time: 07/05/15 10:35 AM  Result Value Ref Range   WBC 6.8 4.0 - 10.5 K/uL   RBC 4.81 4.22 - 5.81 MIL/uL   Hemoglobin 15.3 13.0 - 17.0 g/dL   HCT 46.4 39.0 - 52.0 %   MCV 96.5 78.0 - 100.0 fL   MCH 31.8 26.0 - 34.0 pg   MCHC 33.0 30.0 - 36.0 g/dL   RDW 13.2 11.5 - 15.5 %   Platelets 155 150  - 400 K/uL  Comprehensive metabolic panel     Status: Abnormal   Collection Time: 07/05/15 10:35 AM  Result Value Ref Range   Sodium 139 135 - 145 mmol/L   Potassium 4.2 3.5 - 5.1 mmol/L   Chloride 102 101 - 111 mmol/L   CO2 28 22 - 32 mmol/L   Glucose, Bld 113 (H) 65 - 99 mg/dL   BUN 22 (H) 6 - 20 mg/dL   Creatinine, Ser 1.19 0.61 - 1.24 mg/dL   Calcium 9.3 8.9 - 10.3 mg/dL   Total Protein 7.4 6.5 - 8.1 g/dL   Albumin 4.4 3.5 - 5.0 g/dL   AST 31 15 - 41 U/L   ALT 45 17 - 63 U/L   Alkaline Phosphatase 77 38 - 126 U/L   Total Bilirubin 0.8 0.3 - 1.2 mg/dL   GFR calc non Af Amer >60 >60 mL/min   GFR calc Af Amer >60 >60 mL/min    Comment: (NOTE) The eGFR has been calculated using the CKD EPI equation. This calculation has not been validated in all clinical situations. eGFR's persistently <60 mL/min signify possible Chronic Kidney Disease.    Anion gap 9 5 - 15  Protime-INR     Status: None   Collection  Time: 07/05/15 10:35 AM  Result Value Ref Range   Prothrombin Time 13.5 11.6 - 15.2 seconds   INR 1.01 0.00 - 1.49  Type and screen Order type and screen if day of surgery is less than 15 days from draw of preadmission visit or order morning of surgery if day of surgery is greater than 6 days from preadmission visit.     Status: None   Collection Time: 07/05/15 10:35 AM  Result Value Ref Range   ABO/RH(D) A POS    Antibody Screen NEG    Sample Expiration 07/19/2015    Extend sample reason NO TRANSFUSIONS OR PREGNANCY IN THE PAST 3 MONTHS   Hemoglobin A1c     Status: Abnormal   Collection Time: 07/05/15 10:35 AM  Result Value Ref Range   Hgb A1c MFr Bld 6.5 (H) 4.8 - 5.6 %    Comment: (NOTE)         Pre-diabetes: 5.7 - 6.4         Diabetes: >6.4         Glycemic control for adults with diabetes: <7.0    Mean Plasma Glucose 140 mg/dL    Comment: (NOTE) Performed At: [facility] [place], [name] [id number] Lindon Romp MD WU:[id number]    Glucose, capillary     Status: Abnormal   Collection Time: 07/13/15  7:04 AM  Result Value Ref Range   Glucose-Capillary 107 (H) 65 - 99 mg/dL   Comment 1 Notify RN   Glucose, capillary     Status: Abnormal   Collection Time: 07/13/15 10:38 AM  Result Value Ref Range   Glucose-Capillary 100 (H) 65 - 99 mg/dL   Comment 1 Notify RN    Comment 2 Document in Chart   Glucose, capillary     Status: Abnormal   Collection Time: 07/13/15  5:43 PM  Result Value Ref Range   Glucose-Capillary 139 (H) 65 - 99 mg/dL  Glucose, capillary     Status: Abnormal   Collection Time: 07/13/15  9:34 PM  Result Value Ref Range   Glucose-Capillary 197 (H) 65 - 99 mg/dL  CBC     Status: Abnormal   Collection Time: 07/14/15  4:50 AM  Result Value Ref Range   WBC 10.1 4.0 - 10.5 K/uL   RBC 3.96 (L) 4.22 - 5.81 MIL/uL   Hemoglobin 12.4 (L) 13.0 - 17.0 g/dL   HCT 37.7 (L) 39.0 - 52.0 %   MCV 95.2 78.0 - 100.0 fL   MCH 31.3 26.0 - 34.0 pg   MCHC 32.9 30.0 - 36.0 g/dL   RDW 12.9 11.5 - 15.5 %   Platelets 155 150 - 400 K/uL  Basic metabolic panel     Status: Abnormal   Collection Time: 07/14/15  4:50 AM  Result Value Ref Range   Sodium 138 135 - 145 mmol/L   Potassium 4.2 3.5 - 5.1 mmol/L   Chloride 105 101 - 111 mmol/L   CO2 25 22 - 32 mmol/L   Glucose, Bld 124 (H) 65 - 99 mg/dL   BUN 15 6 - 20 mg/dL   Creatinine, Ser 1.02 0.61 - 1.24 mg/dL   Calcium 8.6 (L) 8.9 - 10.3 mg/dL   GFR calc non Af Amer >60 >60 mL/min   GFR calc Af Amer >60 >60 mL/min    Comment: (NOTE) The eGFR has been calculated using the CKD EPI equation. This calculation has not been validated in all clinical situations. eGFR's persistently <60  mL/min signify possible Chronic Kidney Disease.    Anion gap 8 5 - 15  Glucose, capillary     Status: Abnormal   Collection Time: 07/14/15  7:55 AM  Result Value Ref Range   Glucose-Capillary 190 (H) 65 - 99 mg/dL  Glucose, capillary     Status: Abnormal   Collection Time: 07/14/15  12:33 PM  Result Value Ref Range   Glucose-Capillary 159 (H) 65 - 99 mg/dL  Glucose, capillary     Status: Abnormal   Collection Time: 07/14/15  5:53 PM  Result Value Ref Range   Glucose-Capillary 211 (H) 65 - 99 mg/dL  Glucose, capillary     Status: Abnormal   Collection Time: 07/14/15  8:36 PM  Result Value Ref Range   Glucose-Capillary 232 (H) 65 - 99 mg/dL  CBC     Status: Abnormal   Collection Time: 07/15/15  4:40 AM  Result Value Ref Range   WBC 10.2 4.0 - 10.5 K/uL   RBC 3.75 (L) 4.22 - 5.81 MIL/uL   Hemoglobin 11.9 (L) 13.0 - 17.0 g/dL   HCT 36.0 (L) 39.0 - 52.0 %   MCV 96.0 78.0 - 100.0 fL   MCH 31.7 26.0 - 34.0 pg   MCHC 33.1 30.0 - 36.0 g/dL   RDW 13.2 11.5 - 15.5 %   Platelets 160 150 - 400 K/uL  Basic metabolic panel     Status: Abnormal   Collection Time: 07/15/15  4:40 AM  Result Value Ref Range   Sodium 139 135 - 145 mmol/L   Potassium 4.1 3.5 - 5.1 mmol/L   Chloride 103 101 - 111 mmol/L   CO2 26 22 - 32 mmol/L   Glucose, Bld 140 (H) 65 - 99 mg/dL   BUN 17 6 - 20 mg/dL   Creatinine, Ser 0.96 0.61 - 1.24 mg/dL   Calcium 8.6 (L) 8.9 - 10.3 mg/dL   GFR calc non Af Amer >60 >60 mL/min   GFR calc Af Amer >60 >60 mL/min    Comment: (NOTE) The eGFR has been calculated using the CKD EPI equation. This calculation has not been validated in all clinical situations. eGFR's persistently <60 mL/min signify possible Chronic Kidney Disease.    Anion gap 10 5 - 15  Glucose, capillary     Status: Abnormal   Collection Time: 07/15/15  7:26 AM  Result Value Ref Range   Glucose-Capillary 118 (H) 65 - 99 mg/dL  Glucose, capillary     Status: Abnormal   Collection Time: 07/15/15 12:01 PM  Result Value Ref Range   Glucose-Capillary 116 (H) 65 - 99 mg/dL  Glucose, capillary     Status: None   Collection Time: 07/15/15  5:43 PM  Result Value Ref Range   Glucose-Capillary 97 65 - 99 mg/dL  Glucose, capillary     Status: Abnormal   Collection Time: 07/15/15 10:30 PM   Result Value Ref Range   Glucose-Capillary 107 (H) 65 - 99 mg/dL   Comment 1 Notify RN    Comment 2 Document in Chart   CBC     Status: Abnormal   Collection Time: 07/16/15  3:48 AM  Result Value Ref Range   WBC 8.3 4.0 - 10.5 K/uL   RBC 3.90 (L) 4.22 - 5.81 MIL/uL   Hemoglobin 12.5 (L) 13.0 - 17.0 g/dL   HCT 37.7 (L) 39.0 - 52.0 %   MCV 96.7 78.0 - 100.0 fL   MCH 32.1 26.0 - 34.0 pg   MCHC  33.2 30.0 - 36.0 g/dL   RDW 13.3 11.5 - 15.5 %   Platelets 157 150 - 400 K/uL  Glucose, capillary     Status: Abnormal   Collection Time: 07/16/15  7:23 AM  Result Value Ref Range   Glucose-Capillary 121 (H) 65 - 99 mg/dL  Glucose, capillary     Status: Abnormal   Collection Time: 07/16/15 11:46 AM  Result Value Ref Range   Glucose-Capillary 121 (H) 65 - 99 mg/dL     Assessment & Plan  Problem List Items Addressed This Visit      Cardiovascular and Mediastinum   Essential (primary) hypertension     Endocrine   Type 2 diabetes mellitus without complication, without long-term current use of insulin (HCC) - Primary   Relevant Orders   HgB A1c      Meds ordered this encounter  Medications  . DISCONTD: amoxicillin (AMOXIL) 500 MG capsule    Sig:   . DISCONTD: cephALEXin (KEFLEX) 500 MG capsule    Sig:   . temazepam (RESTORIL) 15 MG capsule    Sig:   . ibuprofen (ADVIL,MOTRIN) 600 MG tablet    Sig: Take 600 mg by mouth every 6 (six) hours as needed.   1. Type 2 diabetes mellitus without complication, without long-term current use of insulin (HCC) Cont. Metformin A1c in 3-4 weeks  2. Essential (primary) hypertension Cont. Lisinopril

## 2015-09-24 ENCOUNTER — Ambulatory Visit: Payer: Medicare HMO | Admitting: Cardiovascular Disease

## 2015-10-08 DIAGNOSIS — E119 Type 2 diabetes mellitus without complications: Secondary | ICD-10-CM | POA: Diagnosis not present

## 2015-10-09 LAB — HEMOGLOBIN A1C
ESTIMATED AVERAGE GLUCOSE: 126 mg/dL
Hgb A1c MFr Bld: 6 % — ABNORMAL HIGH (ref 4.8–5.6)

## 2015-10-25 ENCOUNTER — Encounter: Payer: Self-pay | Admitting: Family Medicine

## 2015-10-25 ENCOUNTER — Ambulatory Visit (INDEPENDENT_AMBULATORY_CARE_PROVIDER_SITE_OTHER): Payer: PPO | Admitting: Family Medicine

## 2015-10-25 VITALS — BP 113/66 | HR 63 | Temp 98.5°F | Resp 16 | Ht 70.5 in | Wt 203.0 lb

## 2015-10-25 DIAGNOSIS — N492 Inflammatory disorders of scrotum: Secondary | ICD-10-CM

## 2015-10-25 MED ORDER — CLINDAMYCIN HCL 300 MG PO CAPS: 300.0000 mg | ORAL_CAPSULE | Freq: Four times a day (QID) | ORAL | Status: DC

## 2015-10-25 NOTE — Progress Notes (Signed)
Subjective:    Patient ID: Andrew Cobb, male    DOB: 1950/07/20, 65 y.o.   MRN: [id number]  HPI: [name] is a 65 y.o. male presenting on 10/25/2015 for Testicle Pain   HPI   Right sided spot on scrotum that doubled in size in 3 days. Noticed 3 days ago. It is superficial, on skin. Looked like a pimple - a "hard, red knot". Reminds him of a MRSA infection that he had in groin in the past, which he had to have I&D's and was hospitalized for 9 days. He also has a strong family history of cancer, so he wanted to make sure to get it checked out. No trauma to the area. No fever, chills, sweats. No urgency, dribbling, hesitancy - he does have frequency but this is unchanged. No pain or swelling.    Past Medical History  Diagnosis Date  . Hypertension   . Diabetes mellitus without complication ([place])   . Stented coronary artery   . MRSA (methicillin resistant Staphylococcus aureus) infection     07/30/2008 thru 08/07/2008  . Coronary artery disease     stent placement / 2  . Sleep apnea     05/16/2014 uses bipap  . 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 Arthritis   . Wears glasses     Current Outpatient Prescriptions on File Prior to Visit  Medication Sig  . cetirizine (ZYRTEC) 10 MG tablet Take 10 mg by mouth daily.  . Cholecalciferol (VITAMIN D3) 5000 units CAPS Take by mouth.  . fluticasone (FLONASE) 50 MCG/ACT nasal spray Place 2 sprays into both nostrils daily as needed for allergies.  [name] Kitchen glucose blood test strip 1 each by Other route 2 (two) times daily. DX E11.9  . ibuprofen (ADVIL,MOTRIN) 600 MG tablet Take 600 mg by mouth every 6 (six) hours as needed.  . Lancets (ONETOUCH ULTRASOFT) lancets 1 each by Other route 2 (two) times daily. Dx E11.9  . lisinopril (PRINIVIL,ZESTRIL) 10 MG tablet Take 1 tablet (10 mg total) by mouth daily.  . Magnesium 250 MG TABS Take by mouth.  . metFORMIN (GLUCOPHAGE) 500 MG  tablet Take 1 tablet (500 mg total) by mouth 2 (two) times daily with a meal.  . methocarbamol (ROBAXIN) 500 MG tablet Take 1 tablet (500 mg total) by mouth every 6 (six) hours as needed for muscle spasms.  . Multiple Vitamins-Minerals (MULTIVITAMIN PO) Take by mouth.  . temazepam (RESTORIL) 15 MG capsule   . traMADol (ULTRAM) 50 MG tablet Take 1-2 tablets (50-100 mg total) by mouth every 6 (six) hours as needed for moderate pain.  . valACYclovir (VALTREX) 500 MG tablet Take 500 mg by mouth 2 (two) times daily.   No current facility-administered medications on file prior to visit.    Review of Systems  Constitutional: Negative for fever, chills, diaphoresis and activity change.  HENT: Negative for ear pain and hearing loss.   Eyes: Negative for visual disturbance.  Respiratory: Negative for chest tightness and shortness of breath.   Cardiovascular: Negative for chest pain.  Gastrointestinal: Negative for abdominal pain.  Genitourinary: Positive for frequency and scrotal swelling. Negative for dysuria, urgency, difficulty urinating and testicular pain.  Musculoskeletal: Negative for myalgias and arthralgias.  Skin: Positive for wound.  Neurological: Negative for dizziness, weakness and headaches.  Hematological: Negative for adenopathy.  Psychiatric/Behavioral: Negative for behavioral problems, sleep disturbance and agitation.   Per HPI  unless specifically indicated above     Objective:    BP 113/66 mmHg  Pulse 63  Temp(Src) 98.5 F (36.9 C) (Oral)  Resp 16  Ht 5' 10.5" (1.791 m)  Wt 203 lb (92.08 kg)  BMI 28.71 kg/m2  Wt Readings from Last 3 Encounters:  10/25/15 203 lb (92.08 kg)  09/12/15 196 lb 3.2 oz (88.996 kg)  07/13/15 200 lb (90.719 kg)    Physical Exam  Constitutional: He is oriented to person, place, and time. He appears well-developed and well-nourished.  HENT:  Head: Normocephalic.  Neck: Normal range of motion.  Cardiovascular: Normal rate, regular rhythm  and normal heart sounds.   Pulmonary/Chest: Effort normal and breath sounds normal.  Genitourinary:    Right testis shows mass.  Musculoskeletal: Normal range of motion.  Neurological: He is alert and oriented to person, place, and time.  Skin: Skin is warm and dry. Lesion noted. There is erythema.  Right scrotal dime-sized abscess on ventral side of scrotum, skin is red without drainage, non-tender, with surrounding border also red, no open areas. Small pustule in center of lesion.   Psychiatric: He has a normal mood and affect. His behavior is normal.   Results for orders placed or performed in visit on 09/12/15  HgB A1c  Result Value Ref Range   Hgb A1c MFr Bld 6.0 (H) 4.8 - 5.6 %   Est. average glucose Bld gHb Est-mCnc 126 mg/dL      Assessment & Plan:   Problem List Items Addressed This Visit    None    Visit Diagnoses    Scrotal abscess    -  Primary    Made appt with [facility] for eval of I&D. Start clindamycin QID for 7 days. Warm compresses to encourage drainage. Alarm symptoms reviewed.     Relevant Medications    clindamycin (CLEOCIN) 300 MG capsule    Other Relevant Orders    Ambulatory referral to Urology       Meds ordered this encounter  Medications  . clindamycin (CLEOCIN) 300 MG capsule    Sig: Take 1 capsule (300 mg total) by mouth 4 (four) times daily. For 7 days.    Dispense:  28 capsule    Refill:  0    Order Specific Question:  Supervising Provider    Answer:  Arlis Porta [id number]      Follow up plan: Return if symptoms worsen or fail to improve.

## 2015-10-25 NOTE — Patient Instructions (Signed)
I think you have an abscess in your scrotum. This should be opened by a urologist. I have called to make you an appt: 815 at [facility].    In the meantime take Clindamycin 4 times daily for 7 days to help with the infection. You can also apply warm compresses to the site to encourage the wound to drainage. Go to the ER for spreading redness, fever, pain or other concerning symptoms.

## 2015-10-26 ENCOUNTER — Encounter: Payer: Self-pay | Admitting: Urology

## 2015-10-26 ENCOUNTER — Ambulatory Visit (INDEPENDENT_AMBULATORY_CARE_PROVIDER_SITE_OTHER): Payer: PPO | Admitting: Urology

## 2015-10-26 VITALS — BP 155/83 | HR 52 | Ht 70.0 in | Wt 198.0 lb

## 2015-10-26 DIAGNOSIS — N492 Inflammatory disorders of scrotum: Secondary | ICD-10-CM

## 2015-10-26 LAB — URINALYSIS, COMPLETE
BILIRUBIN UA: NEGATIVE
GLUCOSE, UA: NEGATIVE
KETONES UA: NEGATIVE
LEUKOCYTES UA: NEGATIVE
NITRITE UA: NEGATIVE
Protein, UA: NEGATIVE
RBC UA: NEGATIVE
SPEC GRAV UA: 1.025 (ref 1.005–1.030)
Urobilinogen, Ur: 0.2 mg/dL (ref 0.2–1.0)
pH, UA: 6 (ref 5.0–7.5)

## 2015-10-26 LAB — MICROSCOPIC EXAMINATION
Bacteria, UA: NONE SEEN
Epithelial Cells (non renal): NONE SEEN /hpf (ref 0–10)
WBC, UA: NONE SEEN /hpf (ref 0–?)

## 2015-10-26 NOTE — Progress Notes (Signed)
10/26/2015 9:19 AM   [name] 08-Dec-1950 [id number]  Referring provider: Arlis Porta., MD [address] [place], [place] [address]  Chief Complaint  Patient presents with  . New Patient (Initial Visit)    scrotal abscess x 5 days. History of MRSA x 2007.     HPI: 65 year old male referred by his primary care physician for evaluation of possible scrotal abscess. He noted an area of right scrotal redness, firmness at the base of his scrotum which started about 5 days ago. Since that time, the area has enlarged to proximally 2-3 times its original size. No drainage. No fevers or chills. No previous history of scrotal abscesses.  + personal history of DM  He does have a personal history of MRSA cellulitis/abscess in the left groin area 10 years ago. He required ORIF and he and hospital admission for this.  Denies any voiding issues. No testicular tenderness or pain.   PMH: Past Medical History  Diagnosis Date  . Hypertension   . Diabetes mellitus without complication ([place])   . Stented coronary artery   . MRSA (methicillin resistant Staphylococcus aureus) infection     07/30/2008 thru 08/07/2008  . Coronary artery disease     stent placement / 2  . Sleep apnea     05/16/2014 uses bipap  . 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 Arthritis   . Wears glasses     Surgical History: Past Surgical History  Procedure Laterality Date  . Cholecystectomy      2006  . Gastric bypass      10/05/2012  . Angioplasty    . Hernia repair      left inguinal 1981  . Appendectomy      1966  . Joint replacement      right knee 04/2006  . Functional endoscopic sinus surgery      11/10/2013  . Left knee meniscal tear       01/25/2010  . Right shoulder replacement       01/27/2006  . Left rotator cuff repair       05/03/2003   . Left ankle surgery       05/03/2003   . Right ankle surgery       fracture has 2 screws  07/07/1997  . Left knee meniscal tear repair       05/04/1996  . Angioplasty      with stent 04/07/1995  . Left carpel tunnel       09/18/1993  . Right carpel tunnel       05/16/1992  . Cardiovascular stress test      07/31/2011  . Colonscopy       08/25/2012  . Total knee arthroplasty Left 07/13/2015    Procedure: LEFT TOTAL KNEE ARTHROPLASTY;  Surgeon: Gaynelle Arabian, MD;  Location: [facility];  Service: Orthopedics;  Laterality: Left;  . Replacement total knee bilateral  07/13/2015    Home Medications:    Medication List       This list is accurate as of: 10/26/15  9:19 AM.  Always use your most recent med list.               cetirizine 10 MG tablet  Commonly known as:  ZYRTEC  Take 10 mg by mouth daily.     clindamycin 300 MG capsule  Commonly known as:  CLEOCIN  Take 1 capsule (300 mg  total) by mouth 4 (four) times daily. For 7 days.     fluticasone 50 MCG/ACT nasal spray  Commonly known as:  FLONASE  Place 2 sprays into both nostrils daily as needed for allergies.     glucose blood test strip  1 each by Other route 2 (two) times daily. DX E11.9     ibuprofen 600 MG tablet  Commonly known as:  ADVIL,MOTRIN  Take 600 mg by mouth every 6 (six) hours as needed.     lisinopril 10 MG tablet  Commonly known as:  PRINIVIL,ZESTRIL  Take 1 tablet (10 mg total) by mouth daily.     Magnesium 250 MG Tabs  Take by mouth.     metFORMIN 500 MG tablet  Commonly known as:  GLUCOPHAGE  Take 1 tablet (500 mg total) by mouth 2 (two) times daily with a meal.     methocarbamol 500 MG tablet  Commonly known as:  ROBAXIN  Take 1 tablet (500 mg total) by mouth every 6 (six) hours as needed for muscle spasms.     MULTIVITAMIN PO  Take by mouth.     onetouch ultrasoft lancets  1 each by Other route 2 (two) times daily. Dx E11.9     temazepam 15 MG capsule  Commonly known as:  RESTORIL     traMADol 50 MG tablet  Commonly known as:  ULTRAM  Take 1-2 tablets (50-100 mg total) by  mouth every 6 (six) hours as needed for moderate pain.     valACYclovir 500 MG tablet  Commonly known as:  VALTREX  Take 500 mg by mouth 2 (two) times daily.     Vitamin D3 5000 units Caps  Take by mouth.        Allergies:  Allergies  Allergen Reactions  . Sulfamethoxazole-Trimethoprim     REACTION: Drug rash  . Tetracyclines & Related Rash    Family History: Family History  Problem Relation Age of Onset  . Cancer Mother     pancreatic  . Diabetes Mother   . Stroke Mother   . Heart disease Mother   . Heart disease Father   . Stroke Father   . Diabetes Father   . Cancer Sister   . Cancer Brother     lung  . Cancer Brother     Social History:  reports that he quit smoking about 31 years ago. His smoking use included Cigarettes. He has a 10 pack-year smoking history. He has never used smokeless tobacco. He reports that he does not drink alcohol or use illicit drugs.  ROS: UROLOGY Frequent Urination?: No Hard to postpone urination?: No Burning/pain with urination?: No Get up at night to urinate?: Yes Leakage of urine?: No Urine stream starts and stops?: No Trouble starting stream?: No Do you have to strain to urinate?: No Blood in urine?: No Urinary tract infection?: No Sexually transmitted disease?: No Injury to kidneys or bladder?: No Painful intercourse?: No Weak stream?: No Erection problems?: No Penile pain?: No  Gastrointestinal Nausea?: No Vomiting?: No Indigestion/heartburn?: No Diarrhea?: No Constipation?: No  Constitutional Fever: No Night sweats?: No Weight loss?: No Fatigue?: No  Skin Skin rash/lesions?: No Itching?: No  Eyes Blurred vision?: No Double vision?: No  Ears/Nose/Throat Sore throat?: No Sinus problems?: No  Hematologic/Lymphatic Swollen glands?: No Easy bruising?: No  Cardiovascular Leg swelling?: No Chest pain?: No  Respiratory Cough?: No Shortness of breath?: No  Endocrine Excessive thirst?:  No  Musculoskeletal Back pain?: Yes Joint pain?: No  Neurological Headaches?: No Dizziness?: No  Psychologic Depression?: No Anxiety?: No  Physical Exam: BP 155/83 mmHg  Pulse 52  Ht 5\' 10"  (1.778 m)  Wt 198 lb (89.812 kg)  BMI 28.41 kg/m2  Constitutional:  Alert and oriented, No acute distress.  Wife present today. HEENT: [place] AT, moist mucus membranes.  Trachea midline, no masses. Cardiovascular: No clubbing, cyanosis, or edema. Respiratory: Normal respiratory effort, no increased work of breathing. GI: Abdomen is soft, nontender, nondistended, no abdominal masses GU: No CVA tenderness. Normal non-edematous scrotum with bilateral descended testicles. Testicles normal without masses or tenderness. Normal phallus. Focal area of firm induration measuring approximately 3 cm on the right inferior portion of the scrotum with mild erythema without fluctuance, crepitus, or drainage.   Skin: No rashes, bruises or suspicious lesions. Lymph: No cervical or inguinal adenopathy. Neurologic: Grossly intact, no focal deficits, moving all 4 extremities. Psychiatric: Normal mood and affect.  Laboratory Data: Lab Results  Component Value Date   WBC 8.3 07/16/2015   HGB 12.5* 07/16/2015   HCT 37.7* 07/16/2015   MCV 96.7 07/16/2015   PLT 157 07/16/2015    Lab Results  Component Value Date   CREATININE 0.96 07/15/2015    Lab Results  Component Value Date   HGBA1C 6.0* 10/08/2015    Pertinent Imaging: n/a  Assessment & Plan:  65 year old diabetic with firm indurated area at the right base of the scrotum consistent with early possible evolving abscess. On exam today, there is no fluctuant area appropriate for I&D at this time.  He was given clindamycin by his PCP yesterday and is yet to fill the prescription. We discussed that I agree with initiation of these antibiotics today. I like to reevaluate on Monday as his area of induration may evolve into a fluctuant area appropriate for  incision and drainage at that time or may resolve with oral antibiotics. We discussed that he could go either way and close follow-up is recommended.  In the interim, he was advised to seek urgent medical attention if there is significant change in his exam, drainage, severe pain, fevers, or any other concerning symptoms in the emergency room over the weekend.  1. Scrotal abscess As above - Urinalysis, Complete   Return for Monday ([place] MD).  Hollice Espy, MD  [facility] [address] [place], [place] [address] [phone]

## 2015-10-30 ENCOUNTER — Ambulatory Visit (INDEPENDENT_AMBULATORY_CARE_PROVIDER_SITE_OTHER): Payer: PPO | Admitting: Urology

## 2015-10-30 ENCOUNTER — Encounter: Payer: Self-pay | Admitting: Urology

## 2015-10-30 VITALS — BP 105/68 | HR 90 | Ht 70.5 in | Wt 201.2 lb

## 2015-10-30 DIAGNOSIS — N492 Inflammatory disorders of scrotum: Secondary | ICD-10-CM | POA: Diagnosis not present

## 2015-10-30 NOTE — Progress Notes (Signed)
65 year old male who presents today in follow-up for a scrotal abscess. The patient was seen on Friday and started on clindamycin. Since starting antibiotics his symptoms improved significantly. He states this morning has gone down significantly. He has not had any significant purulent drainage. He did have one episode where when he pinched the area there was some blood, but this stopped quickly. He denies any fevers or chills. His pain has been minimal. He is tolerating a robotic well. Current Outpatient Prescriptions on File Prior to Visit  Medication Sig Dispense Refill  . cetirizine (ZYRTEC) 10 MG tablet Take 10 mg by mouth daily.    . Cholecalciferol (VITAMIN D3) 5000 units CAPS Take by mouth.    . clindamycin (CLEOCIN) 300 MG capsule Take 1 capsule (300 mg total) by mouth 4 (four) times daily. For 7 days. 28 capsule 0  . fluticasone (FLONASE) 50 MCG/ACT nasal spray Place 2 sprays into both nostrils daily as needed for allergies. 48 g 3  . glucose blood test strip 1 each by Other route 2 (two) times daily. DX E11.9 100 each 12  . ibuprofen (ADVIL,MOTRIN) 600 MG tablet Take 600 mg by mouth every 6 (six) hours as needed.    . Lancets (ONETOUCH ULTRASOFT) lancets 1 each by Other route 2 (two) times daily. Dx E11.9 100 each 12  . lisinopril (PRINIVIL,ZESTRIL) 10 MG tablet Take 1 tablet (10 mg total) by mouth daily. 90 tablet 3  . Magnesium 250 MG TABS Take by mouth.    . metFORMIN (GLUCOPHAGE) 500 MG tablet Take 1 tablet (500 mg total) by mouth 2 (two) times daily with a meal. 180 tablet 3  . methocarbamol (ROBAXIN) 500 MG tablet Take 1 tablet (500 mg total) by mouth every 6 (six) hours as needed for muscle spasms. 80 tablet 1  . Multiple Vitamins-Minerals (MULTIVITAMIN PO) Take by mouth.    . temazepam (RESTORIL) 15 MG capsule     . traMADol (ULTRAM) 50 MG tablet Take 1-2 tablets (50-100 mg total) by mouth every 6 (six) hours as needed for moderate pain. 80 tablet 1  . valACYclovir (VALTREX) 500  MG tablet Take 500 mg by mouth 2 (two) times daily.     No current facility-administered medications on file prior to visit.   Past Medical History  Diagnosis Date  . Hypertension   . Diabetes mellitus without complication ([place])   . Stented coronary artery   . MRSA (methicillin resistant Staphylococcus aureus) infection     07/30/2008 thru 08/07/2008  . Coronary artery disease     stent placement / 2  . Sleep apnea     05/16/2014 uses bipap  . 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 Arthritis   . Wears glasses    PE: Filed Vitals:   10/30/15 1459  BP: 105/68  Pulse: 90   NAD The posterior aspect of the patient's scrotum is a 5 mm area of induration without erythema or cellulitis. There is no purulence.  Impression: The patient had what likely became an infected ingrown hair or hair follicle in the posterior aspect of the scrotum which responded nicely to clindamycin. He has had significant improvement since starting antibiotic. Less likely, this could be an inclusion cyst that was secondarily infected. Recommendations: I recommended the patient continued to take the antibiotic to completion, 7 days total. I expect that this will continue to improve. If the symptoms return, the patient should then  reschedule an appointment to be seen again.  We'll plan to see the patient on as-needed basis.

## 2016-01-14 ENCOUNTER — Ambulatory Visit: Payer: PPO | Admitting: Family Medicine

## 2016-01-17 DIAGNOSIS — L03211 Cellulitis of face: Secondary | ICD-10-CM | POA: Diagnosis not present

## 2016-04-18 DIAGNOSIS — E119 Type 2 diabetes mellitus without complications: Secondary | ICD-10-CM | POA: Diagnosis not present

## 2016-04-27 ENCOUNTER — Other Ambulatory Visit: Payer: Self-pay | Admitting: Family Medicine

## 2016-04-27 DIAGNOSIS — E119 Type 2 diabetes mellitus without complications: Secondary | ICD-10-CM

## 2016-05-21 DIAGNOSIS — G4733 Obstructive sleep apnea (adult) (pediatric): Secondary | ICD-10-CM | POA: Diagnosis not present

## 2016-07-10 ENCOUNTER — Encounter: Payer: PPO | Admitting: Family Medicine

## 2016-07-14 ENCOUNTER — Ambulatory Visit: Payer: PPO | Admitting: Family Medicine

## 2016-07-17 ENCOUNTER — Encounter: Payer: Self-pay | Admitting: Family Medicine

## 2016-07-17 ENCOUNTER — Ambulatory Visit (INDEPENDENT_AMBULATORY_CARE_PROVIDER_SITE_OTHER): Payer: PPO | Admitting: Family Medicine

## 2016-07-17 VITALS — BP 160/90 | HR 63 | Temp 98.7°F | Ht 70.47 in | Wt 209.0 lb

## 2016-07-17 DIAGNOSIS — Z1159 Encounter for screening for other viral diseases: Secondary | ICD-10-CM | POA: Diagnosis not present

## 2016-07-17 DIAGNOSIS — N400 Enlarged prostate without lower urinary tract symptoms: Secondary | ICD-10-CM | POA: Diagnosis not present

## 2016-07-17 DIAGNOSIS — Z9884 Bariatric surgery status: Secondary | ICD-10-CM

## 2016-07-17 DIAGNOSIS — I2584 Coronary atherosclerosis due to calcified coronary lesion: Secondary | ICD-10-CM | POA: Diagnosis not present

## 2016-07-17 DIAGNOSIS — E119 Type 2 diabetes mellitus without complications: Secondary | ICD-10-CM

## 2016-07-17 DIAGNOSIS — Z114 Encounter for screening for human immunodeficiency virus [HIV]: Secondary | ICD-10-CM | POA: Diagnosis not present

## 2016-07-17 DIAGNOSIS — Z23 Encounter for immunization: Secondary | ICD-10-CM | POA: Diagnosis not present

## 2016-07-17 DIAGNOSIS — I2510 Atherosclerotic heart disease of native coronary artery without angina pectoris: Secondary | ICD-10-CM

## 2016-07-17 DIAGNOSIS — Z0000 Encounter for general adult medical examination without abnormal findings: Secondary | ICD-10-CM

## 2016-07-17 DIAGNOSIS — I10 Essential (primary) hypertension: Secondary | ICD-10-CM

## 2016-07-17 LAB — URINALYSIS, ROUTINE W REFLEX MICROSCOPIC
Bilirubin, UA: NEGATIVE
Glucose, UA: NEGATIVE
Ketones, UA: NEGATIVE
Leukocytes, UA: NEGATIVE
NITRITE UA: NEGATIVE
Protein, UA: NEGATIVE
RBC, UA: NEGATIVE
Specific Gravity, UA: 1.02 (ref 1.005–1.030)
UUROB: 0.2 mg/dL (ref 0.2–1.0)
pH, UA: 6.5 (ref 5.0–7.5)

## 2016-07-17 LAB — BAYER DCA HB A1C WAIVED: HB A1C: 6.7 % (ref <7.0)

## 2016-07-17 MED ORDER — METFORMIN HCL 500 MG PO TABS: 500.0000 mg | ORAL_TABLET | Freq: Two times a day (BID) | ORAL | 4 refills | Status: DC

## 2016-07-17 MED ORDER — ZOSTER VACCINE LIVE 19400 UNT/0.65ML ~~LOC~~ SUSR: 0.6500 mL | Freq: Once | SUBCUTANEOUS | 0 refills | Status: AC

## 2016-07-17 MED ORDER — LISINOPRIL 10 MG PO TABS: 10.0000 mg | ORAL_TABLET | Freq: Every day | ORAL | 4 refills | Status: DC

## 2016-07-17 MED ORDER — VALACYCLOVIR HCL 500 MG PO TABS: 500.0000 mg | ORAL_TABLET | Freq: Every day | ORAL | 4 refills | Status: DC

## 2016-07-17 NOTE — Assessment & Plan Note (Signed)
Poor control with no meds will restart

## 2016-07-17 NOTE — Progress Notes (Signed)
BP (!) 160/90 (BP Location: Left Arm)   Pulse 63   Temp 98.7 F (37.1 C) (Oral)   Ht 5' 10.47" (1.79 m)   Wt 209 lb (94.8 kg)   SpO2 94%   BMI 29.59 kg/m    Subjective:    Patient ID: Andrew Cobb, male    DOB: August 11, 1950, 66 y.o.   MRN: [id number]  HPI: Andrew Cobb is a 66 y.o. male  Welcome to [facility] PE AWV metrics met  Patient ran out of his medications back in November. Prior to that was doing well with medications no side effects. Patient has gained some weight over the holidays but no other specific complaints. And otherwise doing well.   Relevant past medical, surgical, family and social history reviewed and updated as indicated. Interim medical history since our last visit reviewed. Allergies and medications reviewed and updated.  Review of Systems  Constitutional: Negative.   HENT: Negative.   Eyes: Negative.   Respiratory: Negative.   Cardiovascular: Negative.   Gastrointestinal: Negative.   Endocrine: Negative.   Genitourinary: Negative.   Musculoskeletal: Negative.   Skin: Negative.   Allergic/Immunologic: Negative.   Neurological: Negative.   Hematological: Negative.   Psychiatric/Behavioral: Negative.     Per HPI unless specifically indicated above     Objective:    BP (!) 160/90 (BP Location: Left Arm)   Pulse 63   Temp 98.7 F (37.1 C) (Oral)   Ht 5' 10.47" (1.79 m)   Wt 209 lb (94.8 kg)   SpO2 94%   BMI 29.59 kg/m   Wt Readings from Last 3 Encounters:  07/17/16 209 lb (94.8 kg)  10/30/15 201 lb 3.2 oz (91.3 kg)  10/26/15 198 lb (89.8 kg)    Physical Exam  Constitutional: He is oriented to person, place, and time. He appears well-developed and well-nourished.  HENT:  Head: Normocephalic and atraumatic.  Right Ear: External ear normal.  Left Ear: External ear normal.  Eyes: Conjunctivae and EOM are normal. Pupils are equal, round, and reactive to light.  Neck: Normal range of motion. Neck supple.  Cardiovascular:  Normal rate, regular rhythm, normal heart sounds and intact distal pulses.   Pulmonary/Chest: Effort normal and breath sounds normal.  Abdominal: Soft. Bowel sounds are normal. There is no splenomegaly or hepatomegaly.  Genitourinary: Rectum normal, prostate normal and penis normal.  Musculoskeletal: Normal range of motion.  Neurological: He is alert and oriented to person, place, and time. He has normal reflexes.  Skin: No rash noted. No erythema.  Psychiatric: He has a normal mood and affect. His behavior is normal. Judgment and thought content normal.  EKG no acute changes  Results for orders placed or performed in visit on 06/12/16  [facility] COLONOSCOPY  Result Value Ref Range   [facility] Colonoscopy Patient Reported See Report (in chart), Patient Reported      Assessment & Plan:   Problem List Items Addressed This Visit      Cardiovascular and Mediastinum   Coronary atherosclerosis    The current medical regimen is effective;  continue present plan and medications.       Relevant Medications   lisinopril (PRINIVIL,ZESTRIL) 10 MG tablet   Other Relevant Orders   TSH   Comprehensive metabolic panel   Lipid panel   CBC with Differential/Platelet   Essential hypertension    Poor control with no meds will restart      Relevant Medications   lisinopril (PRINIVIL,ZESTRIL) 10 MG tablet   Other  Relevant Orders   Comprehensive metabolic panel   CBC with Differential/Platelet   Urinalysis, Routine w reflex microscopic     Endocrine   Type 2 diabetes mellitus without complication, without long-term current use of insulin ([place])    Discussed diabetes doing well will take metformin 1 a day      Relevant Medications   lisinopril (PRINIVIL,ZESTRIL) 10 MG tablet   metFORMIN (GLUCOPHAGE) 500 MG tablet   Other Relevant Orders   TSH   Bayer DCA Hb A1c Waived (STAT)   Comprehensive metabolic panel   CBC with Differential/Platelet     Genitourinary   BPH (benign prostatic hyperplasia)     stable      Relevant Orders   PSA    Other Visit Diagnoses    Need for Tdap vaccination    -  Primary   Relevant Orders   Tdap vaccine greater than or equal to 7yo IM (Completed)   Need for Zostavax administration       Relevant Medications   Zoster Vaccine Live, PF, (ZOSTAVAX) 11643 UNT/0.65ML injection   Welcome to Medicare preventive visit       Relevant Orders   EKG 12-Lead (Completed)   Need for hepatitis C screening test       Relevant Orders   Hepatitis C Antibody   Encounter for screening for HIV       Relevant Orders   HIV antibody (with reflex)       Follow up plan: Return in about 6 months (around 01/14/2017) for Hemoglobin A1c, BMP,  Lipids, ALT, AST.

## 2016-07-17 NOTE — Assessment & Plan Note (Signed)
Discussed diabetes doing well will take metformin 1 a day

## 2016-07-17 NOTE — Assessment & Plan Note (Signed)
The current medical regimen is effective;  continue present plan and medications.  

## 2016-07-17 NOTE — Assessment & Plan Note (Signed)
stable °

## 2016-07-18 LAB — LIPID PANEL
CHOLESTEROL TOTAL: 209 mg/dL — AB (ref 100–199)
Chol/HDL Ratio: 4.2 ratio units (ref 0.0–5.0)
HDL: 50 mg/dL (ref >39)
LDL Calculated: 130 mg/dL — ABNORMAL HIGH (ref 0–99)
TRIGLYCERIDES: 147 mg/dL (ref 0–149)
VLDL Cholesterol Cal: 29 mg/dL (ref 5–40)

## 2016-07-18 LAB — COMPREHENSIVE METABOLIC PANEL
ALBUMIN: 4.4 g/dL (ref 3.6–4.8)
ALT: 40 IU/L (ref 0–44)
AST: 31 IU/L (ref 0–40)
Albumin/Globulin Ratio: 2.1 (ref 1.2–2.2)
Alkaline Phosphatase: 65 IU/L (ref 39–117)
BILIRUBIN TOTAL: 0.6 mg/dL (ref 0.0–1.2)
BUN / CREAT RATIO: 17 (ref 10–24)
BUN: 17 mg/dL (ref 8–27)
CHLORIDE: 100 mmol/L (ref 96–106)
CO2: 24 mmol/L (ref 18–29)
CREATININE: 1.02 mg/dL (ref 0.76–1.27)
Calcium: 9 mg/dL (ref 8.6–10.2)
GFR calc non Af Amer: 77 mL/min/{1.73_m2} (ref >59)
GFR, EST AFRICAN AMERICAN: 89 mL/min/{1.73_m2} (ref >59)
GLUCOSE: 157 mg/dL — AB (ref 65–99)
Globulin, Total: 2.1 g/dL (ref 1.5–4.5)
Potassium: 4.3 mmol/L (ref 3.5–5.2)
Sodium: 141 mmol/L (ref 134–144)
TOTAL PROTEIN: 6.5 g/dL (ref 6.0–8.5)

## 2016-07-18 LAB — CBC WITH DIFFERENTIAL/PLATELET
BASOS: 1 %
Basophils Absolute: 0 10*3/uL (ref 0.0–0.2)
EOS (ABSOLUTE): 0.1 10*3/uL (ref 0.0–0.4)
EOS: 3 %
Hematocrit: 45.3 % (ref 37.5–51.0)
Hemoglobin: 14.8 g/dL (ref 13.0–17.7)
IMMATURE GRANS (ABS): 0 10*3/uL (ref 0.0–0.1)
IMMATURE GRANULOCYTES: 0 %
LYMPHS: 26 %
Lymphocytes Absolute: 0.9 10*3/uL (ref 0.7–3.1)
MCH: 30.5 pg (ref 26.6–33.0)
MCHC: 32.7 g/dL (ref 31.5–35.7)
MCV: 93 fL (ref 79–97)
MONOCYTES: 11 %
Monocytes Absolute: 0.4 10*3/uL (ref 0.1–0.9)
NEUTROS ABS: 2.1 10*3/uL (ref 1.4–7.0)
NEUTROS PCT: 59 %
PLATELETS: 145 10*3/uL — AB (ref 150–379)
RBC: 4.86 x10E6/uL (ref 4.14–5.80)
RDW: 13.1 % (ref 12.3–15.4)
WBC: 3.5 10*3/uL (ref 3.4–10.8)

## 2016-07-18 LAB — HEPATITIS C ANTIBODY

## 2016-07-18 LAB — TSH: TSH: 1.74 u[IU]/mL (ref 0.450–4.500)

## 2016-07-18 LAB — PSA: Prostate Specific Ag, Serum: 4.5 ng/mL — ABNORMAL HIGH (ref 0.0–4.0)

## 2016-07-18 LAB — HIV ANTIBODY (ROUTINE TESTING W REFLEX): HIV Screen 4th Generation wRfx: NONREACTIVE

## 2016-07-21 ENCOUNTER — Telehealth: Payer: Self-pay | Admitting: Family Medicine

## 2016-07-21 DIAGNOSIS — R972 Elevated prostate specific antigen [PSA]: Secondary | ICD-10-CM

## 2016-07-21 NOTE — Telephone Encounter (Signed)
Phone call Discussed with patient PSA slight elevation Will recheck PSA in a couple of months.

## 2016-08-04 DIAGNOSIS — L02421 Furuncle of right axilla: Secondary | ICD-10-CM | POA: Diagnosis not present

## 2016-08-04 DIAGNOSIS — L218 Other seborrheic dermatitis: Secondary | ICD-10-CM | POA: Diagnosis not present

## 2016-08-04 DIAGNOSIS — L821 Other seborrheic keratosis: Secondary | ICD-10-CM | POA: Diagnosis not present

## 2016-08-04 DIAGNOSIS — D485 Neoplasm of uncertain behavior of skin: Secondary | ICD-10-CM | POA: Diagnosis not present

## 2016-10-08 ENCOUNTER — Ambulatory Visit (INDEPENDENT_AMBULATORY_CARE_PROVIDER_SITE_OTHER): Payer: PPO | Admitting: Family Medicine

## 2016-10-08 ENCOUNTER — Encounter: Payer: Self-pay | Admitting: Family Medicine

## 2016-10-08 VITALS — BP 128/74 | HR 58 | Ht 70.0 in | Wt 213.0 lb

## 2016-10-08 DIAGNOSIS — R972 Elevated prostate specific antigen [PSA]: Secondary | ICD-10-CM | POA: Diagnosis not present

## 2016-10-08 DIAGNOSIS — E785 Hyperlipidemia, unspecified: Secondary | ICD-10-CM

## 2016-10-08 DIAGNOSIS — E119 Type 2 diabetes mellitus without complications: Secondary | ICD-10-CM | POA: Diagnosis not present

## 2016-10-08 DIAGNOSIS — I2510 Atherosclerotic heart disease of native coronary artery without angina pectoris: Secondary | ICD-10-CM

## 2016-10-08 DIAGNOSIS — I10 Essential (primary) hypertension: Secondary | ICD-10-CM

## 2016-10-08 DIAGNOSIS — I2584 Coronary atherosclerosis due to calcified coronary lesion: Secondary | ICD-10-CM | POA: Diagnosis not present

## 2016-10-08 LAB — LP+ALT+AST PICCOLO, WAIVED
ALT (SGPT) Piccolo, Waived: 36 U/L (ref 10–47)
AST (SGOT) PICCOLO, WAIVED: 37 U/L (ref 11–38)
CHOLESTEROL PICCOLO, WAIVED: 203 mg/dL — AB (ref <200)
Chol/HDL Ratio Piccolo,Waive: 3.8 mg/dL
HDL CHOL PICCOLO, WAIVED: 54 mg/dL — AB (ref >59)
LDL Chol Calc Piccolo Waived: 105 mg/dL — ABNORMAL HIGH (ref <100)
TRIGLYCERIDES PICCOLO,WAIVED: 217 mg/dL — AB (ref <150)
VLDL CHOL CALC PICCOLO,WAIVE: 43 mg/dL — AB (ref <30)

## 2016-10-08 LAB — BAYER DCA HB A1C WAIVED: HB A1C (BAYER DCA - WAIVED): 6.8 % (ref <7.0)

## 2016-10-08 NOTE — Assessment & Plan Note (Signed)
The current medical regimen is effective;  continue present plan and medications.  

## 2016-10-08 NOTE — Progress Notes (Signed)
BP 128/74 (BP Location: Left Arm)   Pulse (!) 58   Ht 5\' 10"  (1.778 m)   Wt 213 lb (96.6 kg)   SpO2 95%   BMI 30.56 kg/m    Subjective:    Patient ID: Andrew Cobb, male    DOB: January 30, 1951, 66 y.o.   MRN: [id number]  HPI: Andrew Cobb is a 66 y.o. male  Chief Complaint  Patient presents with  . Follow-up    follow-up medical conditions doing well has unfortunately gained some weight since last year. Patient active though with walking and all in all doing well. No chest pain or cardiac symptoms. Blood pressure doing well with medications no complaints. Using CPAP without problems. Diabetes doing well with no complaints or symptoms A1c today of 6.8. As a reminder from the patient before bariatric surgery was on 15 different prescription medications now on only 2 prescription medications. Relevant past medical, surgical, family and social history reviewed and updated as indicated. Interim medical history since our last visit reviewed. Allergies and medications reviewed and updated.  Review of Systems  Constitutional: Negative.   Respiratory: Negative.   Cardiovascular: Negative.     Per HPI unless specifically indicated above     Objective:    BP 128/74 (BP Location: Left Arm)   Pulse (!) 58   Ht 5\' 10"  (1.778 m)   Wt 213 lb (96.6 kg)   SpO2 95%   BMI 30.56 kg/m   Wt Readings from Last 3 Encounters:  10/08/16 213 lb (96.6 kg)  07/17/16 209 lb (94.8 kg)  10/30/15 201 lb 3.2 oz (91.3 kg)    Physical Exam  Constitutional: He is oriented to person, place, and time. He appears well-developed and well-nourished.  HENT:  Head: Normocephalic and atraumatic.  Eyes: Conjunctivae and EOM are normal.  Neck: Normal range of motion.  Cardiovascular: Normal rate, regular rhythm and normal heart sounds.   Pulmonary/Chest: Effort normal and breath sounds normal.  Musculoskeletal: Normal range of motion.  Neurological: He is alert and oriented to person, place, and  time.  Skin: No erythema.  Psychiatric: He has a normal mood and affect. His behavior is normal. Judgment and thought content normal.    Results for orders placed or performed in visit on 07/17/16  TSH  Result Value Ref Range   TSH 1.740 0.450 - 4.500 uIU/mL  Hepatitis C Antibody  Result Value Ref Range   Hep C Virus Ab <0.1 0.0 - 0.9 s/co ratio  Bayer DCA Hb A1c Waived (STAT)  Result Value Ref Range   Bayer DCA Hb A1c Waived 6.7 <7.0 %  Comprehensive metabolic panel  Result Value Ref Range   Glucose 157 (H) 65 - 99 mg/dL   BUN 17 8 - 27 mg/dL   Creatinine, Ser 1.02 0.76 - 1.27 mg/dL   GFR calc non Af Amer 77 >59 mL/min/1.73   GFR calc Af Amer 89 >59 mL/min/1.73   BUN/Creatinine Ratio 17 10 - 24   Sodium 141 134 - 144 mmol/L   Potassium 4.3 3.5 - 5.2 mmol/L   Chloride 100 96 - 106 mmol/L   CO2 24 18 - 29 mmol/L   Calcium 9.0 8.6 - 10.2 mg/dL   Total Protein 6.5 6.0 - 8.5 g/dL   Albumin 4.4 3.6 - 4.8 g/dL   Globulin, Total 2.1 1.5 - 4.5 g/dL   Albumin/Globulin Ratio 2.1 1.2 - 2.2   Bilirubin Total 0.6 0.0 - 1.2 mg/dL   Alkaline Phosphatase 65  39 - 117 IU/L   AST 31 0 - 40 IU/L   ALT 40 0 - 44 IU/L  Lipid panel  Result Value Ref Range   Cholesterol, Total 209 (H) 100 - 199 mg/dL   Triglycerides 147 0 - 149 mg/dL   HDL 50 >39 mg/dL   VLDL Cholesterol Cal 29 5 - 40 mg/dL   LDL Calculated 130 (H) 0 - 99 mg/dL   Chol/HDL Ratio 4.2 0.0 - 5.0 ratio units  CBC with Differential/Platelet  Result Value Ref Range   WBC 3.5 3.4 - 10.8 x10E3/uL   RBC 4.86 4.14 - 5.80 x10E6/uL   Hemoglobin 14.8 13.0 - 17.7 g/dL   Hematocrit 45.3 37.5 - 51.0 %   MCV 93 79 - 97 fL   MCH 30.5 26.6 - 33.0 pg   MCHC 32.7 31.5 - 35.7 g/dL   RDW 13.1 12.3 - 15.4 %   Platelets 145 (L) 150 - 379 x10E3/uL   Neutrophils 59 Not Estab. %   Lymphs 26 Not Estab. %   Monocytes 11 Not Estab. %   Eos 3 Not Estab. %   Basos 1 Not Estab. %   Neutrophils Absolute 2.1 1.4 - 7.0 x10E3/uL   Lymphocytes Absolute  0.9 0.7 - 3.1 x10E3/uL   Monocytes Absolute 0.4 0.1 - 0.9 x10E3/uL   EOS (ABSOLUTE) 0.1 0.0 - 0.4 x10E3/uL   Basophils Absolute 0.0 0.0 - 0.2 x10E3/uL   Immature Granulocytes 0 Not Estab. %   Immature Grans (Abs) 0.0 0.0 - 0.1 x10E3/uL  Urinalysis, Routine w reflex microscopic  Result Value Ref Range   Specific Gravity, UA 1.020 1.005 - 1.030   pH, UA 6.5 5.0 - 7.5   Color, UA Yellow Yellow   Appearance Ur Clear Clear   Leukocytes, UA Negative Negative   Protein, UA Negative Negative/Trace   Glucose, UA Negative Negative   Ketones, UA Negative Negative   RBC, UA Negative Negative   Bilirubin, UA Negative Negative   Urobilinogen, Ur 0.2 0.2 - 1.0 mg/dL   Nitrite, UA Negative Negative  PSA  Result Value Ref Range   Prostate Specific Ag, Serum 4.5 (H) 0.0 - 4.0 ng/mL  HIV antibody (with reflex)  Result Value Ref Range   HIV Screen 4th Generation wRfx Non Reactive Non Reactive      Assessment & Plan:   Problem List Items Addressed This Visit      Cardiovascular and Mediastinum   Coronary atherosclerosis    The current medical regimen is effective;  continue present plan and medications.       Essential hypertension - Primary    The current medical regimen is effective;  continue present plan and medications.       Relevant Orders   Bayer DCA Hb A1c Waived   Basic metabolic panel   LP+ALT+AST [place], Waived     Endocrine   Type 2 diabetes mellitus without complication, without long-term current use of insulin ([place])   Relevant Orders   Bayer DCA Hb A1c Waived   Basic metabolic panel   LP+ALT+AST [place], Waived    Other Visit Diagnoses    Hyperlipidemia, unspecified hyperlipidemia type       Relevant Orders   Bayer DCA Hb A1c Waived   Basic metabolic panel   LP+ALT+AST [place], Waived   Elevated PSA           Follow up plan: Return in about 6 months (around 04/09/2017) for Physical Exam, Hemoglobin A1c.

## 2016-10-09 ENCOUNTER — Telehealth: Payer: Self-pay | Admitting: Family Medicine

## 2016-10-09 DIAGNOSIS — R972 Elevated prostate specific antigen [PSA]: Secondary | ICD-10-CM

## 2016-10-09 LAB — BASIC METABOLIC PANEL
BUN/Creatinine Ratio: 18 (ref 10–24)
BUN: 21 mg/dL (ref 8–27)
CO2: 24 mmol/L (ref 18–29)
CREATININE: 1.19 mg/dL (ref 0.76–1.27)
Calcium: 9.5 mg/dL (ref 8.6–10.2)
Chloride: 100 mmol/L (ref 96–106)
GFR calc Af Amer: 74 mL/min/{1.73_m2} (ref >59)
GFR, EST NON AFRICAN AMERICAN: 64 mL/min/{1.73_m2} (ref >59)
GLUCOSE: 140 mg/dL — AB (ref 65–99)
Potassium: 4.8 mmol/L (ref 3.5–5.2)
Sodium: 138 mmol/L (ref 134–144)

## 2016-10-09 LAB — PSA: Prostate Specific Ag, Serum: 4.8 ng/mL — ABNORMAL HIGH (ref 0.0–4.0)

## 2016-10-09 NOTE — Telephone Encounter (Signed)
Phone call Discussed with patient elevated PSA patient's had previous prostate procedures with Dr. Eliberto Ivory for refer back to Dr. Eliberto Ivory.

## 2016-10-13 ENCOUNTER — Telehealth: Payer: Self-pay | Admitting: Family Medicine

## 2016-10-13 NOTE — Telephone Encounter (Signed)
Referral directed to [place]. They will call and schedule appointment.

## 2016-11-04 ENCOUNTER — Encounter: Payer: Self-pay | Admitting: Urology

## 2016-11-04 ENCOUNTER — Ambulatory Visit: Payer: PPO | Admitting: Urology

## 2016-11-04 DIAGNOSIS — R972 Elevated prostate specific antigen [PSA]: Secondary | ICD-10-CM

## 2016-11-04 DIAGNOSIS — N492 Inflammatory disorders of scrotum: Secondary | ICD-10-CM

## 2016-11-04 MED ORDER — CIPROFLOXACIN HCL 500 MG PO TABS: 500.0000 mg | ORAL_TABLET | Freq: Two times a day (BID) | ORAL | 0 refills | Status: AC

## 2016-11-04 NOTE — Progress Notes (Signed)
11/04/2016 6:42 AM   Andrew Cobb 12-31-1950 [id number]  Referring provider: Guadalupe Maple, MD [place], [place] [address]  CC: Elevated PSA  HPI:  1 - Elevated PSA - No FHX prostate cancer. Persistantly elevated PSA noted by PCP 2018. 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 He is diabetic.   3 - Enlarged Prostate with Lower Urinary Tract Symptoms - s/p thermotherapy by Yves Dill around 2008 for mix of obsructive and irritative smptoms with good control   PMH sig for DM2 (A1c <1), Obesity, Knee replacement, Gastric Bypass (lost 70lbs and kept off). His PCP is Golden Pop MD.  Today "Andrew Cobb" is seen as referral from Dr. Jeananne Rama for evalation of persistantly elevated PSA.   PMH: Past Medical History:  Diagnosis Date  . Arthritis   . Asthma   . Chronic venous insufficiency    varicose vein lower extremity with inflammation  . GERD (gastroesophageal reflux disease)    no issues since gastric bypass surgery as stated per pt  . Hyperlipidemia   . Hypogonadism in male   . MRSA (methicillin resistant Staphylococcus aureus) infection    07/30/2008 thru 08/07/2008  . Stented coronary artery   . Thrombocythemia [facility])     Surgical History: Past Surgical History:  Procedure Laterality Date  . ANGIOPLASTY    . ANGIOPLASTY     with stent 04/07/1995  . APPENDECTOMY     1966  . CARDIOVASCULAR STRESS TEST     07/31/2011  . CARPAL TUNNEL RELEASE Left   . CHOLECYSTECTOMY     2006  . colonscopy      08/25/2012  . FUNCTIONAL ENDOSCOPIC SINUS SURGERY     11/10/2013  . GASTRIC BYPASS     10/05/2012  . HERNIA REPAIR     left inguinal 1981  . JOINT REPLACEMENT     right knee 04/2006  . left ankle surgery      05/03/2003   . left carpel tunnel      09/18/1993  . left knee meniscal tear      01/25/2010  . left knee meniscal  tear repair      05/04/1996  . left rotator cuff repair      05/03/2003   . REPLACEMENT TOTAL KNEE BILATERAL  07/13/2015  . right ankle surgery      fracture has 2 screws 07/07/1997  . right carpel tunnel      05/16/1992  . right shoulder replacement      01/27/2006  . TOTAL KNEE ARTHROPLASTY Left 07/13/2015   Procedure: LEFT TOTAL KNEE ARTHROPLASTY;  Surgeon: Gaynelle Arabian, MD;  Location: [facility];  Service: Orthopedics;  Laterality: Left;    Home Medications:  Allergies as of 11/04/2016      Reactions   Sulfamethoxazole-trimethoprim    REACTION: Drug rash   Tetracyclines & Related Rash      Medication List       Accurate as of 11/04/16  6:42 AM. Always use your most recent med list.          cetirizine 10 MG tablet Commonly known as:  ZYRTEC Take 10 mg by mouth daily.   glucose blood test strip 1 each by Other route 2 (two) times daily. DX E11.9   lisinopril 10 MG tablet Commonly known as:  PRINIVIL,ZESTRIL Take 1 tablet (10  mg total) by mouth daily.   Magnesium 250 MG Tabs Take by mouth.   metFORMIN 500 MG tablet Commonly known as:  GLUCOPHAGE Take 1 tablet (500 mg total) by mouth 2 (two) times daily with a meal.   MULTIVITAMIN PO Take by mouth.   onetouch ultrasoft lancets 1 each by Other route 2 (two) times daily. Dx E11.9   valACYclovir 500 MG tablet Commonly known as:  VALTREX Take 1 tablet (500 mg total) by mouth daily.   Vitamin D3 5000 units Caps Take by mouth.       Allergies:  Allergies  Allergen Reactions  . Sulfamethoxazole-Trimethoprim     REACTION: Drug rash  . Tetracyclines & Related Rash    Family History: Family History  Problem Relation Age of Onset  . Cancer Mother     pancreatic  . Diabetes Mother   . Stroke Mother   . Heart disease Mother   . Hyperlipidemia Mother   . Hypertension Mother   . Heart attack Mother   . Heart disease Father   . Stroke Father   . Diabetes Father   . Hypertension Father   . Heart attack  Father   . Hyperlipidemia Father   . Cancer Sister   . Cancer Brother     lung  . Cancer Brother     Social History:  reports that he quit smoking about 32 years ago. His smoking use included Cigarettes. He has a 10.00 pack-year smoking history. He has never used smokeless tobacco. He reports that he does not drink alcohol or use drugs.   Review of Systems  Gastrointestinal (upper)  : Negative for upper GI symptoms  Gastrointestinal (lower) : Negative for lower GI symptoms  Constitutional : Negative for symptoms  Skin: Negative for skin symptoms  Eyes: Negative for eye symptoms  Ear/Nose/Throat : Negative for Ear/Nose/Throat symptoms  Hematologic/Lymphatic: Negative for Hematologic/Lymphatic symptoms  Cardiovascular : Negative for cardiovascular symptoms  Respiratory : Negative for respiratory symptoms  Endocrine: Negative for endocrine symptoms  Musculoskeletal: Negative for musculoskeletal symptoms  Neurological: Negative for neurological symptoms  Psychologic: Negative for psychiatric symptoms  Physical Exam: There were no vitals taken for this visit.  Constitutional:  Alert and oriented, No acute distress. HEENT: [place] AT, moist mucus membranes.  Trachea midline, no masses. Cardiovascular: No clubbing, cyanosis, or edema. Respiratory: Normal respiratory effort, no increased work of breathing. GI: Abdomen is soft, nontender, nondistended, no abdominal masses GU: No CVA tenderness.  DRE 70 gm smooth Skin: No rashes, bruises or suspicious lesions. Lymph: No cervical or inguinal adenopathy. Neurologic: Grossly intact, no focal deficits, moving all 4 extremities. Psychiatric: Normal mood and affect.  Laboratory Data: Lab Results  Component Value Date   WBC 3.5 07/17/2016   HGB 12.5 (L) 07/16/2015   HCT 45.3 07/17/2016   MCV 93 07/17/2016   PLT 145 (L) 07/17/2016    Lab Results  Component Value Date   CREATININE 1.19 10/08/2016    No results  found for: PSA  No results found for: TESTOSTERONE  Lab Results  Component Value Date   HGBA1C 6.0 (H) 10/08/2015    Urinalysis    Component Value Date/Time   COLORURINE YELLOW 07/05/2015 1002   APPEARANCEUR Clear 07/17/2016 0900   LABSPEC 1.018 07/05/2015 1002   PHURINE 5.5 07/05/2015 1002   GLUCOSEU Negative 07/17/2016 0900   HGBUR NEGATIVE 07/05/2015 1002   BILIRUBINUR Negative 07/17/2016 0900   KETONESUR NEGATIVE 07/05/2015 1002   PROTEINUR Negative 07/17/2016 0900  PROTEINUR NEGATIVE 07/05/2015 1002   UROBILINOGEN 0.2 07/11/2012 1443   NITRITE Negative 07/17/2016 0900   NITRITE NEGATIVE 07/05/2015 1002   LEUKOCYTESUR Negative 07/17/2016 0900    Pertinent Imaging: none  Assessment & Plan:   1 - Elevated PSA - DDX benign and malignant etiologies discussed as well as natural history of prostate cancer. He does have comorbidity, but presently well controlled. Furhter management options of serial labs, imaging, genetic tests, biopsy (only test that can confirm cancer) discussed in order of increasing agressivenss. Biopsy recommended. He opts for BX and I agree.  Risks, benefits, peri-BX course discussed.   2 - Recurrent Scrotal Abscess - no evidence of active infection. His better glycemic control after gastric bypass will hopefuly make this less frequent.   3 - Enlarged Prostate with Lower Urinary Tract Symptoms - minimal bother at present, observe. Would consider future finasteride based on exam if bother were to progress  RTC next avail prostate BX  Alexis Frock, [place] [address] [place], [place] [address] [phone]

## 2016-11-17 ENCOUNTER — Encounter: Payer: Self-pay | Admitting: Family Medicine

## 2016-11-17 ENCOUNTER — Ambulatory Visit (INDEPENDENT_AMBULATORY_CARE_PROVIDER_SITE_OTHER): Payer: PPO | Admitting: Family Medicine

## 2016-11-17 VITALS — BP 121/80 | HR 61 | Temp 97.7°F | Ht 71.0 in | Wt 212.0 lb

## 2016-11-17 DIAGNOSIS — Z021 Encounter for pre-employment examination: Secondary | ICD-10-CM | POA: Diagnosis not present

## 2016-11-17 NOTE — Progress Notes (Signed)
BP 121/80   Pulse 61   Temp 97.7 F (36.5 C)   Ht 5\' 11"  (1.803 m)   Wt 212 lb (96.2 kg)   SpO2 97%   BMI 29.57 kg/m    Subjective:    Patient ID: Andrew Cobb, male    DOB: 02-Sep-1950, 66 y.o.   MRN: [id number]  HPI: Andrew Cobb is a 66 y.o. male  Chief Complaint  Patient presents with  . 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 Has been with the company many years but the company was recently bought out by a larger company so due to this change, he has new paperwork to fill out. Feeling well, no concerns with hearing, vision, CP, SOB, dizziness, syncope, hypoglycemic episodes. Recent labs appear relatively stable, taking medications faithfully.   Relevant past medical, surgical, family and social history reviewed and updated as indicated. Interim medical history since our last visit reviewed. Allergies and medications reviewed and updated.  Review of Systems  Constitutional: Negative.   HENT: Negative.   Respiratory: Negative.   Cardiovascular: Negative.   Gastrointestinal: Negative.   Musculoskeletal: Negative.   Neurological: Negative.   Psychiatric/Behavioral: Negative.     Per HPI unless specifically indicated above     Objective:    BP 121/80   Pulse 61   Temp 97.7 F (36.5 C)   Ht 5\' 11"  (1.803 m)   Wt 212 lb (96.2 kg)   SpO2 97%   BMI 29.57 kg/m   Wt Readings from Last 3 Encounters:  11/17/16 212 lb (96.2 kg)  11/04/16 215 lb 9.6 oz (97.8 kg)  10/08/16 213 lb (96.6 kg)    Physical Exam  Constitutional: He is oriented to person, place, and time. He appears well-developed and well-nourished.  HENT:  Head: Atraumatic.  Whisper test WNL b/l ears  Eyes: Conjunctivae are normal. Pupils are equal, round, and reactive to light.  Neck: Normal range of motion. Neck supple.  Cardiovascular: Normal rate.   Pulmonary/Chest:  Effort normal. No respiratory distress.  Musculoskeletal: Normal range of motion.  Neurological: He is alert and oriented to person, place, and time.  Skin: Skin is warm and dry.  Psychiatric: He has a normal mood and affect. His behavior is normal.  Nursing note and vitals reviewed.  Color vision was WNL Visual Acuity: L was 20/50, R was 20/30  Results for orders placed or performed in visit on 10/08/16  Bayer DCA Hb A1c Waived  Result Value Ref Range   Bayer DCA Hb A1c Waived 6.8 <0.0 %  Basic metabolic panel  Result Value Ref Range   Glucose 140 (H) 65 - 99 mg/dL   BUN 21 8 - 27 mg/dL   Creatinine, Ser 1.19 0.76 - 1.27 mg/dL   GFR calc non Af Amer 64 >59 mL/min/1.73   GFR calc Af Amer 74 >59 mL/min/1.73   BUN/Creatinine Ratio 18 10 - 24   Sodium 138 134 - 144 mmol/L   Potassium 4.8 3.5 - 5.2 mmol/L   Chloride 100 96 - 106 mmol/L   CO2 24 18 - 29 mmol/L   Calcium 9.5 8.6 - 10.2 mg/dL  LP+ALT+AST Piccolo, Waived  Result Value Ref Range   ALT (SGPT) Piccolo, Waived 36 10 - 47 U/L   AST (SGOT) Piccolo, Waived 37 11 - 38 U/L   Cholesterol Piccolo, Waived 203 (  H) <200 mg/dL   HDL Chol Piccolo, Waived 54 (L) >59 mg/dL   Triglycerides Piccolo,Waived 217 (H) <150 mg/dL   Chol/HDL Ratio Piccolo,Waive 3.8 mg/dL   LDL Chol Calc Piccolo Waived 105 (H) <100 mg/dL   VLDL Chol Calc Piccolo,Waive 43 (H) <30 mg/dL  PSA  Result Value Ref Range   Prostate Specific Ag, Serum 4.8 (H) 0.0 - 4.0 ng/mL      Assessment & Plan:   Problem List Items Addressed This Visit    None    Visit Diagnoses    Pre-employment examination    -  Primary   Form completed, findings WNL today       Follow up plan: Return for as scheduled.

## 2016-11-17 NOTE — Patient Instructions (Signed)
Follow up as scheduled for regular chronic illness management

## 2016-11-24 ENCOUNTER — Ambulatory Visit: Payer: PPO | Admitting: Urology

## 2016-11-24 ENCOUNTER — Encounter: Payer: Self-pay | Admitting: Urology

## 2016-11-24 ENCOUNTER — Other Ambulatory Visit: Payer: Self-pay | Admitting: Urology

## 2016-11-24 VITALS — BP 109/67 | HR 72 | Ht 70.5 in | Wt 215.0 lb

## 2016-11-24 DIAGNOSIS — R972 Elevated prostate specific antigen [PSA]: Secondary | ICD-10-CM

## 2016-11-24 DIAGNOSIS — C61 Malignant neoplasm of prostate: Secondary | ICD-10-CM | POA: Diagnosis not present

## 2016-11-24 MED ORDER — GENTAMICIN SULFATE 40 MG/ML IJ SOLN
80.0000 mg | Freq: Once | INTRAMUSCULAR | Status: AC
Start: 2016-11-24 — End: 2016-11-24
  Administered 2016-11-24: 80 mg via INTRAMUSCULAR

## 2016-11-24 MED ORDER — LIDOCAINE HCL 2 % EX GEL
1.0000 "application " | Freq: Once | CUTANEOUS | Status: AC
  Administered 2016-11-24: 1 via URETHRAL

## 2016-11-24 NOTE — Addendum Note (Signed)
Addended by: Kyra Manges on: 11/24/2016 02:01 PM   Modules accepted: Orders

## 2016-11-24 NOTE — Progress Notes (Signed)
Prostate Biopsy Procedure   Informed consent was obtained after discussing risks/benefits of the procedure.  A time out was performed to ensure correct patient identity.  Pre-Procedure: - Last PSA Level: No results found for: PSA  4.8 on 10/08/16 - Gentamicin given prophylactically - Levaquin 500 mg administered PO -Transrectal Ultrasound performed revealing a 61 gm prostate - small median lobe was noted.  Procedure: - Prostate block performed using 10 cc 1% lidocaine and biopsies taken from sextant areas, a total of 12 under ultrasound guidance.  Post-Procedure: - Patient tolerated the procedure well - He was counseled to seek immediate medical attention if experiences any severe pain, significant bleeding, or fevers - Return in one week to discuss biopsy results

## 2016-11-28 ENCOUNTER — Other Ambulatory Visit: Payer: Self-pay | Admitting: Urology

## 2016-11-28 LAB — PATHOLOGY REPORT

## 2016-12-03 ENCOUNTER — Telehealth: Payer: Self-pay | Admitting: Family Medicine

## 2016-12-03 NOTE — Telephone Encounter (Signed)
Pt saw rachel on 11/17/12. Will print out note and fax it.

## 2016-12-03 NOTE — Telephone Encounter (Signed)
I did not see anything in chart about w/ and w/o glasses. Did not see form that was referenced in Media either. Please advise.

## 2016-12-03 NOTE — Telephone Encounter (Signed)
Patients wife said patients employer needs a copy of the eye exam faxed over to [phone]  She said he had this eye exam in order to get the job with [employer]   I was a little uncertain of the information she needed [place] to fax to them.  Something regarding with glasses and without glasses results.  She said she could be reached at [phone]  Thanks

## 2016-12-04 NOTE — Telephone Encounter (Signed)
Called patients wife to give her the information per Rachel's note.  She said she would let the patient know.  Thanks

## 2016-12-04 NOTE — Telephone Encounter (Signed)
Please call pt and ask if he could drop back in for with and without correction eye exam or if he's seen the eye doctor lately to get results from them - looks like we only did corrected vision so if that is not sufficient we will need to get a second vision test done

## 2016-12-08 ENCOUNTER — Telehealth: Payer: Self-pay | Admitting: Family Medicine

## 2016-12-08 DIAGNOSIS — Z8546 Personal history of malignant neoplasm of prostate: Secondary | ICD-10-CM | POA: Insufficient documentation

## 2016-12-08 DIAGNOSIS — C61 Malignant neoplasm of prostate: Secondary | ICD-10-CM | POA: Insufficient documentation

## 2016-12-08 NOTE — Telephone Encounter (Signed)
Trish called back will refax form.

## 2016-12-08 NOTE — Telephone Encounter (Signed)
Called patient who directed me to [place]. Stated she needed the paper work. I then  [employer] [place] @ [phone] - Wannetta Sender. VM was left requesting that paper work be faxed back to [place] so we could correct missing information.

## 2016-12-08 NOTE — Progress Notes (Signed)
12/09/2016 11:21 AM   Andrew Cobb May 12, 1951 [id number]  Referring provider: Guadalupe Maple, MD [place], [place] [address]  CC: Elevated PSA  HPI:  1 - Low Risk Prostate Cancer  - 3 Cores Gleason 6 up to 18% of core LMM, LMA, LLM by biopsy 2018 on eval PSA 4.8. TRUS 95mL with small median lobe.   2 - Recurrent Scrotal Abscess - pt witih several bouts of MRSA groin-scrotal abscess and cellulits that has responded to PO meds. He is diabetic.   3 - Enlarged Prostate with Lower Urinary Tract Symptoms - s/p thermotherapy by Yves Dill around 2008 for mix of obsructive and irritative smptoms with good control.  PMH sig for DM2 (A1c <1), Obesity, Knee replacement, Gastric Bypass (lost 70lbs and kept off). His PCP is Golden Pop MD.  Today "Andrew Cobb" is seen in f/u above and discuss management options for new low risk prostate cancer.    PMH: Past Medical History:  Diagnosis Date  . Arthritis   . Asthma   . Chronic venous insufficiency    varicose vein lower extremity with inflammation  . GERD (gastroesophageal reflux disease)    no issues since gastric bypass surgery as stated per pt  . Hyperlipidemia   . Hypogonadism in male   . MRSA (methicillin resistant Staphylococcus aureus) infection    07/30/2008 thru 08/07/2008  . Stented coronary artery   . 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 ANGIOPLASTY    . ANGIOPLASTY     with stent 04/07/1995  . APPENDECTOMY     1966  . CARDIOVASCULAR STRESS TEST     07/31/2011  . CARPAL TUNNEL RELEASE Left   . CHOLECYSTECTOMY     2006  . colonscopy      08/25/2012  . FUNCTIONAL ENDOSCOPIC SINUS SURGERY     11/10/2013  . GASTRIC BYPASS     10/05/2012  . HERNIA REPAIR     left inguinal 1981  . JOINT REPLACEMENT     right knee 04/2006  . left ankle surgery      05/03/2003   . left carpel tunnel      09/18/1993  . left knee meniscal tear      01/25/2010  . left  knee meniscal tear repair      05/04/1996  . left rotator cuff repair      05/03/2003   . REPLACEMENT TOTAL KNEE BILATERAL  07/13/2015  . right ankle surgery      fracture has 2 screws 07/07/1997  . right carpel tunnel      05/16/1992  . right shoulder replacement      01/27/2006  . TOTAL KNEE ARTHROPLASTY Left 07/13/2015   Procedure: LEFT TOTAL KNEE ARTHROPLASTY;  Surgeon: Gaynelle Arabian, MD;  Location: [facility];  Service: Orthopedics;  Laterality: Left;    Home Medications:  Allergies as of 12/09/2016      Reactions   Sulfamethoxazole-trimethoprim    REACTION: Drug rash   Tetracyclines & Related Rash      Medication List       Accurate as of 12/08/16 11:21 AM. Always use your most recent med list.          cetirizine 10 MG tablet Commonly known as:  ZYRTEC Take 10 mg by mouth daily.   glucose blood test strip 1 each by Other route 2 (two) times daily. DX E11.9   lisinopril 10 MG tablet Commonly known  as:  PRINIVIL,ZESTRIL Take 1 tablet (10 mg total) by mouth daily.   Magnesium 250 MG Tabs Take by mouth.   metFORMIN 500 MG tablet Commonly known as:  GLUCOPHAGE Take 1 tablet (500 mg total) by mouth 2 (two) times daily with a meal.   MULTIVITAMIN PO Take by mouth.   onetouch ultrasoft lancets 1 each by Other route 2 (two) times daily. Dx E11.9   valACYclovir 500 MG tablet Commonly known as:  VALTREX Take 1 tablet (500 mg total) by mouth daily.   Vitamin D3 5000 units Caps Take by mouth.       Allergies:  Allergies  Allergen Reactions  . Sulfamethoxazole-Trimethoprim     REACTION: Drug rash  . Tetracyclines & Related Rash    Family History: Family History  Problem Relation Age of Onset  . Cancer Mother        pancreatic  . Diabetes Mother   . Stroke Mother   . Heart disease Mother   . Hyperlipidemia Mother   . Hypertension Mother   . Heart attack Mother   . Heart disease Father   . Stroke Father   . Diabetes Father   . Hypertension Father   .  Heart attack Father   . Hyperlipidemia Father   . Pancreatic cancer Father   . Cancer Sister   . Cancer Brother        lung  . Cancer Brother   . Kidney cancer Neg Hx   . Bladder Cancer Neg Hx   . Prostate cancer Neg Hx     Social History:  reports that he quit smoking about 32 years ago. His smoking use included Cigarettes. He has a 10.00 pack-year smoking history. He has never used smokeless tobacco. He reports that he does not drink alcohol or use drugs.   Review of Systems  Gastrointestinal (upper)  : Negative for upper GI symptoms  Gastrointestinal (lower) : Negative for lower GI symptoms  Constitutional : Negative for symptoms  Skin: Negative for skin symptoms  Eyes: Negative for eye symptoms  Ear/Nose/Throat : Negative for Ear/Nose/Throat symptoms  Hematologic/Lymphatic: Negative for Hematologic/Lymphatic symptoms  Cardiovascular : Negative for cardiovascular symptoms  Respiratory : Negative for respiratory symptoms  Endocrine: Negative for endocrine symptoms  Musculoskeletal: Negative for musculoskeletal symptoms  Neurological: Negative for neurological symptoms  Psychologic: Negative for psychiatric symptoms  Physical Exam: There were no vitals taken for this visit.  Constitutional:  Alert and oriented, No acute distress. HEENT: [place] AT, moist mucus membranes.  Trachea midline, no masses. Cardiovascular: No clubbing, cyanosis, or edema. Respiratory: Normal respiratory effort, no increased work of breathing. GI: Abdomen is soft, nontender, nondistended, no abdominal masses GU: No CVA tenderness.  DRE 70 gm smooth Skin: No rashes, bruises or suspicious lesions. Lymph: No cervical or inguinal adenopathy. Neurologic: Grossly intact, no focal deficits, moving all 4 extremities. Psychiatric: Normal mood and affect.  Laboratory Data: Lab Results  Component Value Date   WBC 3.5 07/17/2016   HGB 12.5 (L) 07/16/2015   HCT 45.3 07/17/2016   MCV  93 07/17/2016   PLT 145 (L) 07/17/2016    Lab Results  Component Value Date   CREATININE 1.19 10/08/2016    No results found for: PSA  No results found for: TESTOSTERONE  Lab Results  Component Value Date   HGBA1C 6.0 (H) 10/08/2015    Urinalysis    Component Value Date/Time   COLORURINE YELLOW 07/05/2015 1002   APPEARANCEUR Clear 07/17/2016 0900   LABSPEC 1.018  07/05/2015 1002   PHURINE 5.5 07/05/2015 1002   GLUCOSEU Negative 07/17/2016 0900   HGBUR NEGATIVE 07/05/2015 1002   BILIRUBINUR Negative 07/17/2016 0900   KETONESUR NEGATIVE 07/05/2015 1002   PROTEINUR Negative 07/17/2016 0900   PROTEINUR NEGATIVE 07/05/2015 1002   UROBILINOGEN 0.2 07/11/2012 1443   NITRITE Negative 07/17/2016 0900   NITRITE NEGATIVE 07/05/2015 1002   LEUKOCYTESUR Negative 07/17/2016 0900    Pertinent Imaging: none  Assessment & Plan:   1 - Low Risk Prostate Cancer - natural history and management options discussed in detail ranging from surveillance, ablative therapies, and surgery. Given history of prior outlet procedure, surgery not first choice as recovery of continence usually compromised. External beam radiation or surveillance likelky most reasonable.    I also recommended rad-onc discussion.   He opts for surveillance. This is completely reasonable.   2 - Recurrent Scrotal Abscess - no evidence of active infection. His better glycemic control after gastric bypass will hopefuly make this less frequent.   3 - Enlarged Prostate with Lower Urinary Tract Symptoms - minimal bother at present, observe. Would consider future finasteride based on exam if bother were to progress.  RTC 6 mos with PSA, then plan on repeat BX in abtou 1 year.   Alexis Frock, [facility] [address], [place], [place] [address] [phone]

## 2016-12-08 NOTE — Telephone Encounter (Signed)
Patient called in regards to his transportation paperwork. Patient wanted to speak with someone about his eye exam. Patient worked with [address] in regards to his transportation paperwork.   Please Advise.  Thank you

## 2016-12-09 ENCOUNTER — Ambulatory Visit (INDEPENDENT_AMBULATORY_CARE_PROVIDER_SITE_OTHER): Payer: PPO | Admitting: Urology

## 2016-12-09 ENCOUNTER — Encounter: Payer: Self-pay | Admitting: Urology

## 2016-12-09 VITALS — BP 126/71 | HR 60 | Ht 70.5 in | Wt 216.6 lb

## 2016-12-09 DIAGNOSIS — N492 Inflammatory disorders of scrotum: Secondary | ICD-10-CM

## 2016-12-09 DIAGNOSIS — C61 Malignant neoplasm of prostate: Secondary | ICD-10-CM | POA: Diagnosis not present

## 2016-12-09 NOTE — Telephone Encounter (Signed)
Pt brought in form, Keri rechecked vision and corrected form. Pt was given copy back.

## 2016-12-16 ENCOUNTER — Ambulatory Visit (INDEPENDENT_AMBULATORY_CARE_PROVIDER_SITE_OTHER): Payer: PPO | Admitting: Family Medicine

## 2016-12-16 ENCOUNTER — Encounter: Payer: Self-pay | Admitting: Family Medicine

## 2016-12-16 DIAGNOSIS — L989 Disorder of the skin and subcutaneous tissue, unspecified: Secondary | ICD-10-CM | POA: Diagnosis not present

## 2016-12-16 DIAGNOSIS — C61 Malignant neoplasm of prostate: Secondary | ICD-10-CM | POA: Diagnosis not present

## 2016-12-16 DIAGNOSIS — I2584 Coronary atherosclerosis due to calcified coronary lesion: Secondary | ICD-10-CM

## 2016-12-16 DIAGNOSIS — I2510 Atherosclerotic heart disease of native coronary artery without angina pectoris: Secondary | ICD-10-CM | POA: Diagnosis not present

## 2016-12-16 DIAGNOSIS — I10 Essential (primary) hypertension: Secondary | ICD-10-CM

## 2016-12-16 NOTE — Assessment & Plan Note (Signed)
No signs or symptoms

## 2016-12-16 NOTE — Assessment & Plan Note (Signed)
The current medical regimen is effective;  continue present plan and medications.  

## 2016-12-16 NOTE — Assessment & Plan Note (Signed)
Patient with 3 little spots that came back positive on biopsy will alternate biopsy and digital rectal exam by urologists for ongoing care and observation.

## 2016-12-16 NOTE — Progress Notes (Signed)
   BP 136/70 (BP Location: Left Arm)   Pulse (!) 58   Wt 215 lb (97.5 kg)   SpO2 99%   BMI 30.41 kg/m    Subjective:    Patient ID: Andrew Cobb, male    DOB: [birth year], 66 y.o.   MRN: [id number]  HPI: RAFEL GARDE is a 66 y.o. male  Chief Complaint  Patient presents with  . Cyst   Present for about 2 weeks now and getting more sore and is grown. No known trauma or irritation is on the contact surface of his forearm since that it's uncomfortable chair wrists etc.  Blood pressure initially elevated taking medications faithfully on recheck blood pressure comes back normal.  Relevant past medical, surgical, family and social history reviewed and updated as indicated. Interim medical history since our last visit reviewed. Allergies and medications reviewed and updated.  Review of Systems  Constitutional: Negative.   Respiratory: Negative.   Cardiovascular: Negative.     Per HPI unless specifically indicated above     Objective:    BP 136/70 (BP Location: Left Arm)   Pulse (!) 58   Wt 215 lb (97.5 kg)   SpO2 99%   BMI 30.41 kg/m   Wt Readings from Last 3 Encounters:  12/16/16 215 lb (97.5 kg)  12/09/16 216 lb 9.6 oz (98.2 kg)  11/24/16 215 lb (97.5 kg)    Physical Exam  Constitutional: He is oriented to person, place, and time. He appears well-developed and well-nourished.  HENT:  Head: Normocephalic and atraumatic.  Eyes: Conjunctivae and EOM are normal.  Neck: Normal range of motion.  Cardiovascular: Normal rate, regular rhythm and normal heart sounds.   Pulmonary/Chest: Effort normal and breath sounds normal.  Musculoskeletal: Normal range of motion.  Neurological: He is alert and oriented to person, place, and time.  Skin: No erythema.  5 mm cyst forearm surface right arm easily movable than adjacent to cysts moves along with moving the cyst along. Appears to be attached.  Psychiatric: He has a normal mood and affect. His behavior is normal. Judgment  and thought content normal.        Assessment & Plan:   Problem List Items Addressed This Visit      Cardiovascular and Mediastinum   Coronary atherosclerosis    No signs or symptoms      Essential hypertension    The current medical regimen is effective;  continue present plan and medications.         Musculoskeletal and Integument   Skin lesion of right arm    Because of attachment to vein will refer to dermatology to further evaluate.      Relevant Orders   Ambulatory referral to Dermatology     Genitourinary   Prostate cancer [facility])    Patient with 3 little spots that came back positive on biopsy will alternate biopsy and digital rectal exam by urologists for ongoing care and observation.          Follow up plan: Return for As scheduled.

## 2016-12-16 NOTE — Assessment & Plan Note (Signed)
Because of attachment to vein will refer to dermatology to further evaluate.

## 2017-01-01 DIAGNOSIS — D485 Neoplasm of uncertain behavior of skin: Secondary | ICD-10-CM | POA: Diagnosis not present

## 2017-01-26 ENCOUNTER — Ambulatory Visit: Payer: PPO | Admitting: Family Medicine

## 2017-01-27 ENCOUNTER — Ambulatory Visit (INDEPENDENT_AMBULATORY_CARE_PROVIDER_SITE_OTHER): Payer: PPO | Admitting: Family Medicine

## 2017-01-27 ENCOUNTER — Encounter: Payer: Self-pay | Admitting: Family Medicine

## 2017-01-27 VITALS — BP 128/76 | HR 56 | Temp 97.7°F | Wt 214.0 lb

## 2017-01-27 DIAGNOSIS — B009 Herpesviral infection, unspecified: Secondary | ICD-10-CM | POA: Diagnosis not present

## 2017-01-27 DIAGNOSIS — I10 Essential (primary) hypertension: Secondary | ICD-10-CM | POA: Diagnosis not present

## 2017-01-27 MED ORDER — VALACYCLOVIR HCL 500 MG PO TABS: 500.0000 mg | ORAL_TABLET | Freq: Every day | ORAL | 4 refills | Status: DC

## 2017-01-27 MED ORDER — LISINOPRIL 10 MG PO TABS: 10.0000 mg | ORAL_TABLET | Freq: Every day | ORAL | 4 refills | Status: DC

## 2017-01-27 NOTE — Assessment & Plan Note (Signed)
Stable and at goal with 10 mg lisinopril. Continue current regimen

## 2017-01-27 NOTE — Progress Notes (Signed)
   BP 128/76   Pulse (!) 56   Temp 97.7 F (36.5 C)   Wt 214 lb (97.1 kg)   SpO2 97%   BMI 30.27 kg/m    Subjective:    Patient ID: Andrew Cobb, male    DOB: 1950-08-04, 66 y.o.   MRN: [id number]  HPI: Andrew Cobb is a 66 y.o. male  Chief Complaint  Patient presents with  . Hypertension    He needs a refill on the lisinopril.  . Medication Refill    He needs a refill on Valtrex   Patient presents today for BP follow up. 125/70s at home when checked. No CP, SOB, syncope, HAs. Taking lisinopril faithfully without side effects.   Also needing refill on valtrex 500 mg QD. No flares that he's noted since being on the medication. Taking faithfully without side effects.   Relevant past medical, surgical, family and social history reviewed and updated as indicated. Interim medical history since our last visit reviewed. Allergies and medications reviewed and updated.  Review of Systems  Constitutional: Negative.   HENT: Negative.   Respiratory: Negative.   Cardiovascular: Negative.   Gastrointestinal: Negative.   Musculoskeletal: Negative.   Neurological: Negative.   Psychiatric/Behavioral: Negative.     Per HPI unless specifically indicated above     Objective:    BP 128/76   Pulse (!) 56   Temp 97.7 F (36.5 C)   Wt 214 lb (97.1 kg)   SpO2 97%   BMI 30.27 kg/m   Wt Readings from Last 3 Encounters:  01/27/17 214 lb (97.1 kg)  12/16/16 215 lb (97.5 kg)  12/09/16 216 lb 9.6 oz (98.2 kg)    Physical Exam  Constitutional: He is oriented to person, place, and time. He appears well-developed and well-nourished. No distress.  HENT:  Head: Atraumatic.  Eyes: Pupils are equal, round, and reactive to light. Conjunctivae are normal.  Neck: Normal range of motion. Neck supple.  Cardiovascular: Normal rate and normal heart sounds.   Pulmonary/Chest: Effort normal and breath sounds normal. No respiratory distress.  Musculoskeletal: Normal range of motion.    Neurological: He is alert and oriented to person, place, and time.  Skin: Skin is warm and dry.  Psychiatric: He has a normal mood and affect. His behavior is normal.  Nursing note and vitals reviewed.     Assessment & Plan:   Problem List Items Addressed This Visit      Cardiovascular and Mediastinum   Essential hypertension - Primary    Stable and at goal with 10 mg lisinopril. Continue current regimen      Relevant Medications   lisinopril (PRINIVIL,ZESTRIL) 10 MG tablet   lisinopril (PRINIVIL,ZESTRIL) 10 MG tablet    Other Visit Diagnoses    HSV infection       Stable on daily suppressive valtrex. Continue current regimen   Relevant Medications   valACYclovir (VALTREX) 500 MG tablet       Follow up plan: Return in about 6 months (around 07/30/2017) for CPE.

## 2017-01-27 NOTE — Patient Instructions (Signed)
Follow up in 6 months for CPE 

## 2017-02-23 ENCOUNTER — Telehealth: Payer: Self-pay

## 2017-02-23 ENCOUNTER — Ambulatory Visit (INDEPENDENT_AMBULATORY_CARE_PROVIDER_SITE_OTHER): Payer: PPO | Admitting: Urology

## 2017-02-23 ENCOUNTER — Encounter: Payer: Self-pay | Admitting: Urology

## 2017-02-23 VITALS — BP 124/84 | HR 65 | Ht 70.5 in | Wt 208.1 lb

## 2017-02-23 DIAGNOSIS — N492 Inflammatory disorders of scrotum: Secondary | ICD-10-CM | POA: Diagnosis not present

## 2017-02-23 LAB — MICROSCOPIC EXAMINATION
Epithelial Cells (non renal): NONE SEEN /hpf (ref 0–10)
RBC, UA: NONE SEEN /hpf (ref 0–?)
WBC, UA: NONE SEEN /hpf (ref 0–?)

## 2017-02-23 LAB — URINALYSIS, COMPLETE
Bilirubin, UA: NEGATIVE
GLUCOSE, UA: NEGATIVE
LEUKOCYTES UA: NEGATIVE
Nitrite, UA: NEGATIVE
RBC, UA: NEGATIVE
Urobilinogen, Ur: 1 mg/dL (ref 0.2–1.0)
pH, UA: 5.5 (ref 5.0–7.5)

## 2017-02-23 MED ORDER — TRAMADOL HCL 50 MG PO TABS: 50.0000 mg | ORAL_TABLET | Freq: Four times a day (QID) | ORAL | 0 refills | Status: DC | PRN

## 2017-02-23 MED ORDER — CIPROFLOXACIN HCL 500 MG PO TABS: 500.0000 mg | ORAL_TABLET | Freq: Two times a day (BID) | ORAL | 0 refills | Status: DC

## 2017-02-23 NOTE — Progress Notes (Signed)
02/23/2017 12:24 PM   Andrew Cobb 05-03-1951 [id number]  Referring provider: Guadalupe Maple, MD [address] [place], [place] [address]  CC: Scrotal abscess  HPI: Patient is a 66 year old WM with a history of low risk prostate cancer, BPH with LU TS and recurrent scrotal abcsess who presents today with the complaints of recurrence with his wife, Andrew Cobb  1 - Recurrent Scrotal Abscess - pt witih several bouts of MRSA groin-scrotal abscess and cellulits that has responded to PO meds. He is diabetic.  He states that on Friday he noted a small "pimple" on right scrotum.  It continued to enlarge over the weekend.  He has not had a fever, chills, nausea or vomiting.  He is also experiencing urinary hesitancy.       PMH: Past Medical History:  Diagnosis Date  . Arthritis   . Asthma   . Chronic venous insufficiency    varicose vein lower extremity with inflammation  . GERD (gastroesophageal reflux disease)    no issues since gastric bypass surgery as stated per pt  . Hyperlipidemia   . Hypogonadism in male   . MRSA (methicillin resistant Staphylococcus aureus) infection    07/30/2008 thru 08/07/2008  . Stented coronary artery   . Thrombocythemia [facility])     Surgical History: Past Surgical History:  Procedure Laterality Date  . ANGIOPLASTY    . ANGIOPLASTY     with stent 04/07/1995  . APPENDECTOMY     1966  . CARDIOVASCULAR STRESS TEST     07/31/2011  . CARPAL TUNNEL RELEASE Left   . CHOLECYSTECTOMY     2006  . colonscopy      08/25/2012  . FUNCTIONAL ENDOSCOPIC SINUS SURGERY     11/10/2013  . GASTRIC BYPASS     10/05/2012  . HERNIA REPAIR     left inguinal 1981  . JOINT REPLACEMENT     right knee 04/2006  . left ankle surgery      05/03/2003   . left carpel tunnel      09/18/1993  . left knee meniscal tear      01/25/2010  . left knee meniscal tear repair      05/04/1996  . left rotator cuff repair      05/03/2003   . REPLACEMENT TOTAL KNEE BILATERAL  07/13/2015   . right ankle surgery      fracture has 2 screws 07/07/1997  . right carpel tunnel      05/16/1992  . right shoulder replacement      01/27/2006  . TOTAL KNEE ARTHROPLASTY Left 07/13/2015   Procedure: LEFT TOTAL KNEE ARTHROPLASTY;  Surgeon: Andrew Arabian, MD;  Location: [facility];  Service: Orthopedics;  Laterality: Left;    Home Medications:  Allergies as of 02/23/2017      Reactions   Sulfamethoxazole-trimethoprim    REACTION: Drug rash   Tetracyclines & Related Rash      Medication List       Accurate as of 02/23/17 12:24 PM. Always use your most recent med list.          cetirizine 10 MG tablet Commonly known as:  ZYRTEC Take 10 mg by mouth daily.   ciprofloxacin 500 MG tablet Commonly known as:  CIPRO Take 1 tablet (500 mg total) by mouth every 12 (twelve) hours.   glucose blood test strip 1 each by Other route 2 (two) times daily. DX E11.9   lisinopril 10 MG tablet Commonly known as:  PRINIVIL,ZESTRIL  Take 1 tablet (10 mg total) by mouth daily.   lisinopril 10 MG tablet Commonly known as:  PRINIVIL,ZESTRIL Take 1 tablet (10 mg total) by mouth daily.   Magnesium 250 MG Tabs Take by mouth.   metFORMIN 500 MG tablet Commonly known as:  GLUCOPHAGE Take 1 tablet (500 mg total) by mouth 2 (two) times daily with a meal.   MULTIVITAMIN PO Take by mouth.   onetouch ultrasoft lancets 1 each by Other route 2 (two) times daily. Dx E11.9   traMADol 50 MG tablet Commonly known as:  ULTRAM Take 1 tablet (50 mg total) by mouth every 6 (six) hours as needed.   valACYclovir 500 MG tablet Commonly known as:  VALTREX Take 1 tablet (500 mg total) by mouth daily.   Vitamin D3 5000 units Caps Take by mouth.       Allergies:  Allergies  Allergen Reactions  . Sulfamethoxazole-Trimethoprim     REACTION: Drug rash  . Tetracyclines & Related Rash    Family History: Family History  Problem Relation Age of Onset  . Cancer Mother        pancreatic  . Diabetes  Mother   . Stroke Mother   . Heart disease Mother   . Hyperlipidemia Mother   . Hypertension Mother   . Heart attack Mother   . Heart disease Father   . Stroke Father   . Diabetes Father   . Hypertension Father   . Heart attack Father   . Hyperlipidemia Father   . Pancreatic cancer Father   . Cancer Sister   . Cancer Brother        lung  . Cancer Brother   . Kidney cancer Neg Hx   . Bladder Cancer Neg Hx   . Prostate cancer Neg Hx     Social History:  reports that he quit smoking about 32 years ago. His smoking use included Cigarettes. He has a 10.00 pack-year smoking history. He has never used smokeless tobacco. He reports that he does not drink alcohol or use drugs.   Review of Systems UROLOGY Frequent Urination?: No Hard to postpone urination?: No Burning/pain with urination?: Yes Get up at night to urinate?: No Leakage of urine?: No Urine stream starts and stops?: No Trouble starting stream?: Yes Do you have to strain to urinate?: No Blood in urine?: No Urinary tract infection?: No Sexually transmitted disease?: No Injury to kidneys or bladder?: No Painful intercourse?: No Weak stream?: No Erection problems?: No Penile pain?: No Gastrointestinal Nausea?: No Vomiting?: No Indigestion/heartburn?: No Diarrhea?: No Constipation?: No Constitutional Fever: No Night sweats?: No Weight loss?: No Fatigue?: No Skin Skin rash/lesions?: No Itching?: No Eyes Blurred vision?: No Double vision?: No Ears/Nose/Throat Sore throat?: No Sinus problems?: No Hematologic/Lymphatic Swollen glands?: No Easy bruising?: No Cardiovascular Leg swelling?: No Chest pain?: No Respiratory Cough?: No Shortness of breath?: No Endocrine Excessive thirst?: No Musculoskeletal Back pain?: No Joint pain?: Yes Neurological Headaches?: No Dizziness?: No Psychologic Depression?: No Anxiety?: No    Physical Exam: BP 124/84   Pulse 65   Ht 5' 10.5" (1.791 m)   Wt 208  lb 1.6 oz (94.4 kg)   BMI 29.44 kg/m   Constitutional: Well nourished. Alert and oriented, No acute distress. HEENT: [place] AT, moist mucus membranes. Trachea midline, no masses. Cardiovascular: No clubbing, cyanosis, or edema. Respiratory: Normal respiratory effort, no increased work of breathing. GI: Abdomen is soft, non tender, non distended, no abdominal masses. Liver and spleen not palpable.  No hernias appreciated.  Stool sample for occult testing is not indicated.   GU: No CVA tenderness.  No bladder fullness or masses.  Patient with circumcised phallus.  Urethral meatus is patent.  No penile discharge. No penile lesions or rashes. Right scrotum with a fluncuant area located in the lower scrotal area (7 cm x 7 cm) but it does not extend to anus and there is no crepitus.  Testicles are located scrotally bilaterally. No masses are appreciated in the testicles. Left and right epididymis are normal. Rectal: Patient with  normal sphincter tone. Anus and perineum without scarring or rashes. No rectal masses are appreciated. Prostate is approximately 70 grams, no nodules are appreciated. Seminal vesicles are normal. Skin: No rashes, bruises or suspicious lesions. Lymph: No cervical or inguinal adenopathy. Neurologic: Grossly intact, no focal deficits, moving all 4 extremities. Psychiatric: Normal mood and affect.  Laboratory Data: Lab Results  Component Value Date   WBC 3.5 07/17/2016   HGB 14.8 07/17/2016   HCT 45.3 07/17/2016   MCV 93 07/17/2016   PLT 145 (L) 07/17/2016    Lab Results  Component Value Date   CREATININE 1.19 10/08/2016    Urinalysis Moderate bacteria.  See EPIC.  Procedure Patient's right scrotum is cleansed with Betadine.  5 cc of 1% xylocaine is injected into the fluctuant area.  The area is punctured with a 15 blade and 5 cc of white purulent material is retrieved.  The wound is swabbed for culture.  It was then explored with a sterile q-tip.  The wound is packed  with iodoform dressing and covered with 4 x 4 guaze and abd pad.     Assessment & Plan:   1. Recurrent Scrotal Abscess  - abscess I & D  - will RTC tomorrow  - reviewed red flag signs and instructed to seek care in the ED if any present  - wound cultures sent - started on Cipro 500 mg tid - given Tramadol 50 mg q 6 for pain #10     Royden Purl  [facility] [address] [place], [place] [address] [phone]

## 2017-02-23 NOTE — Telephone Encounter (Signed)
Pt called stating his abscess got worse over the weekend. Pt described s/s to be not eating x3 days, not able to sit down, testicles 3x bigger than normal and bright red, tender to the touch, heat at site but not generalized fever, and naucous. Pt denied vomiting, fever, chills. Per Larene Beach pt was added to her schedule for today.

## 2017-02-23 NOTE — Progress Notes (Signed)
 02/24/2017 10:59 AM   Andrew [place] 07/06/1951 [id number]  Referring provider: Crissman, Andrew A, MD [address] [place], [place] [address]  CC: Scrotal abscess  HPI: 66 yo WM who presents today for a recheck on scrotal abscess.  Patient is a 66 year old WM with a history of low risk prostate cancer, BPH with LU TS and recurrent scrotal abscess who presented yesterday with the complaints of recurrence with his wife, Andrew Cobb  1 - Recurrent Scrotal Abscess - pt with several bouts of MRSA groin-scrotal abscess and cellulitis that has responded to PO meds. He is diabetic.  He states that on Friday he noted a small "pimple" on right scrotum.  It continued to enlarge over the weekend.  He has not had a fever, chills, nausea or vomiting.  He is also experiencing urinary hesitancy.    His abscess was I & D yesterday.    He states last night he had a lot of drainage from the site.  He states he changes the 4 x 4 guaze three times.  His ABD pad and gauze have drainage on them today.  It does not have a foul odor.  He took two tramadol due to pain last night.  He denies any fevers, chills, nausea or vomiting.  PMH: Past Medical History:  Diagnosis Date  . Arthritis   . Asthma   . Chronic venous insufficiency    varicose vein lower extremity with inflammation  . GERD (gastroesophageal reflux disease)    no issues since gastric bypass surgery as stated per pt  . Hyperlipidemia   . Hypogonadism in male   . MRSA (methicillin resistant Staphylococcus aureus) infection    07/30/2008 thru 08/07/2008  . Stented coronary artery   . Thrombocythemia ([facility])     Surgical History: Past Surgical History:  Procedure Laterality Date  . ANGIOPLASTY    . ANGIOPLASTY     with stent 04/07/1995  . APPENDECTOMY     1966  . CARDIOVASCULAR STRESS TEST     07/31/2011  . CARPAL TUNNEL RELEASE Left   . CHOLECYSTECTOMY     2006  . colonscopy      08/25/2012  . FUNCTIONAL ENDOSCOPIC SINUS SURGERY     11/10/2013  . GASTRIC BYPASS     10/05/2012  . HERNIA REPAIR     left inguinal 1981  . JOINT REPLACEMENT     right knee 04/2006  . left ankle surgery      05/03/2003   . left carpel tunnel      09/18/1993  . left knee meniscal tear      01/25/2010  . left knee meniscal tear repair      05/04/1996  . left rotator cuff repair      05/03/2003   . REPLACEMENT TOTAL KNEE BILATERAL  07/13/2015  . right ankle surgery      fracture has 2 screws 07/07/1997  . right carpel tunnel      05/16/1992  . right shoulder replacement      01/27/2006  . TOTAL KNEE ARTHROPLASTY Left 07/13/2015   Procedure: LEFT TOTAL KNEE ARTHROPLASTY;  Surgeon: Frank Aluisio, MD;  Location: [facility];  Service: Orthopedics;  Laterality: Left;    Home Medications:  Allergies as of 02/24/2017      Reactions   Sulfamethoxazole-trimethoprim    REACTION: Drug rash   Tetracyclines & Related Rash      Medication List       Accurate as of 02/24/17 10:59   AM. Always use your most recent med list.          cetirizine 10 MG tablet Commonly known as:  ZYRTEC Take 10 mg by mouth daily.   ciprofloxacin 500 MG tablet Commonly known as:  CIPRO Take 1 tablet (500 mg total) by mouth every 12 (twelve) hours.   glucose blood test strip 1 each by Other route 2 (two) times daily. DX E11.9   lisinopril 10 MG tablet Commonly known as:  PRINIVIL,ZESTRIL Take 1 tablet (10 mg total) by mouth daily.   lisinopril 10 MG tablet Commonly known as:  PRINIVIL,ZESTRIL Take 1 tablet (10 mg total) by mouth daily.   Magnesium 250 MG Tabs Take by mouth.   metFORMIN 500 MG tablet Commonly known as:  GLUCOPHAGE Take 1 tablet (500 mg total) by mouth 2 (two) times daily with a meal.   MULTIVITAMIN PO Take by mouth.   onetouch ultrasoft lancets 1 each by Other route 2 (two) times daily. Dx E11.9   traMADol 50 MG tablet Commonly known as:  ULTRAM Take 1 tablet (50 mg total) by mouth every 6 (six) hours as needed.   valACYclovir 500  MG tablet Commonly known as:  VALTREX Take 1 tablet (500 mg total) by mouth daily.   Vitamin D3 5000 units Caps Take by mouth.       Allergies:  Allergies  Allergen Reactions  . Sulfamethoxazole-Trimethoprim     REACTION: Drug rash  . Tetracyclines & Related Rash    Family History: Family History  Problem Relation Age of Onset  . Cancer Mother        pancreatic  . Diabetes Mother   . Stroke Mother   . Heart disease Mother   . Hyperlipidemia Mother   . Hypertension Mother   . Heart attack Mother   . Heart disease Father   . Stroke Father   . Diabetes Father   . Hypertension Father   . Heart attack Father   . Hyperlipidemia Father   . Pancreatic cancer Father   . Cancer Sister   . Cancer Brother        lung  . Cancer Brother   . Kidney cancer Neg Hx   . Bladder Cancer Neg Hx   . Prostate cancer Neg Hx     Social History:  reports that he quit smoking about 32 years ago. His smoking use included Cigarettes. He has a 10.00 pack-year smoking history. He has never used smokeless tobacco. He reports that he does not drink alcohol or use drugs.   Review of Systems UROLOGY Frequent Urination?: No Hard to postpone urination?: No Burning/pain with urination?: No Get up at night to urinate?: No Leakage of urine?: No Urine stream starts and stops?: Yes Trouble starting stream?: No Do you have to strain to urinate?: No Blood in urine?: No Urinary tract infection?: No Sexually transmitted disease?: No Injury to kidneys or bladder?: No Painful intercourse?: No Weak stream?: No Erection problems?: No Penile pain?: No Gastrointestinal Nausea?: No Vomiting?: No Indigestion/heartburn?: No Diarrhea?: No Constipation?: No Constitutional Fever: No Night sweats?: No Weight loss?: No Fatigue?: No Skin Skin rash/lesions?: No Itching?: No Eyes Blurred vision?: No Double vision?: No Ears/Nose/Throat Sore throat?: No Sinus problems?:  No Hematologic/Lymphatic Swollen glands?: No Easy bruising?: No Cardiovascular Leg swelling?: No Chest pain?: No Respiratory Cough?: No Shortness of breath?: No Endocrine Excessive thirst?: No Musculoskeletal Back pain?: No Joint pain?: Yes Neurological Headaches?: No Dizziness?: No Psychologic Depression?: No Anxiety?: No      Physical Exam: BP 136/79   Pulse 69   Ht 5' 10.5" (1.791 m)   Wt 210 lb 12.8 oz (95.6 kg)   BMI 29.82 kg/m   Constitutional: Well nourished. Alert and oriented, No acute distress. HEENT: [place] AT, moist mucus membranes. Trachea midline, no masses. Cardiovascular: No clubbing, cyanosis, or edema. Respiratory: Normal respiratory effort, no increased work of breathing. GI: Abdomen is soft, non tender, non distended, no abdominal masses. Liver and spleen not palpable.  No hernias appreciated.  Stool sample for occult testing is not indicated.   GU: No CVA tenderness.  No bladder fullness or masses.  Patient with circumcised phallus.  Urethral meatus is patent.  No penile discharge. No penile lesions or rashes. Right scrotum with open wound (5 mm)  Scant amount of drainage is expressed from the wound with palpation.  No fluctuance or crepitus is noted today.  testicles are located scrotally bilaterally. No masses are appreciated in the testicles. Left and right epididymis are normal. Skin: No rashes, bruises or suspicious lesions. Lymph: No cervical or inguinal adenopathy. Neurologic: Grossly intact, no focal deficits, moving all 4 extremities. Psychiatric: Normal mood and affect.  Laboratory Data: Lab Results  Component Value Date   WBC 3.5 07/17/2016   HGB 14.8 07/17/2016   HCT 45.3 07/17/2016   MCV 93 07/17/2016   PLT 145 (L) 07/17/2016    Lab Results  Component Value Date   CREATININE 1.19 10/08/2016     Procedure Patient's right scrotum is cleansed with Betadine.  The wound is packed with iodoform dressing and covered with 4 x 4 guaze  and abd pad.     Assessment & Plan:   1. Recurrent Scrotal Abscess  - abscess I & D  - will RTC tomorrow  - reviewed red flag signs and instructed to seek care in the ED if any present  - wound cultures sent - started on Cipro 500 mg tid - given Tramadol 50 mg q 6 for pain #10     Merland Holness, PA-C  [facility] [address], [address] [place], [place] [address] [phone] 

## 2017-02-24 ENCOUNTER — Encounter: Payer: Self-pay | Admitting: Urology

## 2017-02-24 ENCOUNTER — Ambulatory Visit: Payer: PPO

## 2017-02-24 ENCOUNTER — Ambulatory Visit (INDEPENDENT_AMBULATORY_CARE_PROVIDER_SITE_OTHER): Payer: PPO | Admitting: Urology

## 2017-02-24 VITALS — BP 136/79 | HR 69 | Ht 70.5 in | Wt 210.8 lb

## 2017-02-24 DIAGNOSIS — N492 Inflammatory disorders of scrotum: Secondary | ICD-10-CM

## 2017-02-24 NOTE — Progress Notes (Signed)
02/25/2017 2:44 PM   Andrew Cobb May 22, 1951 [id number]  Referring provider: Guadalupe Maple, MD [address] [place], [place] [address]  CC: Scrotal abscess  HPI: 66 yo WM who presents today for a recheck on scrotal abscess.  Patient is a 66 year old WM with a history of low risk prostate cancer, BPH with LU TS and recurrent scrotal abscess who presented yesterday with the complaints of recurrence with his wife, Andrew Cobb  1 - Recurrent Scrotal Abscess - pt with several bouts of MRSA groin-scrotal abscess and cellulitis that has responded to PO meds. He is diabetic.  He states that on Friday he noted a small "pimple" on right scrotum.  It continued to enlarge over the weekend.  He has not had a fever, chills, nausea or vomiting.  He is also experiencing urinary hesitancy.    His abscess was I & D on 02/25/2017.    He continues to have a lot of drainage from the site.  He states the pain is reducing.   He denies any fevers, chills, nausea or vomiting.  Scrotal ultrasound performed on 02/25/2017 noted diffuse scrotal wall thickening with probable phlegmon involving the posterior inferior right scrotum. No drainable abscess identified. Close clinical follow up recommended.  Small bilateral hydroceles.  Both testes appear normal  PMH: Past Medical History:  Diagnosis Date  . Arthritis   . Asthma   . Chronic venous insufficiency    varicose vein lower extremity with inflammation  . GERD (gastroesophageal reflux disease)    no issues since gastric bypass surgery as stated per pt  . Hyperlipidemia   . Hypogonadism in male   . MRSA (methicillin resistant Staphylococcus aureus) infection    07/30/2008 thru 08/07/2008  . Stented coronary artery   . Thrombocythemia [facility])     Surgical History: Past Surgical History:  Procedure Laterality Date  . ANGIOPLASTY    . ANGIOPLASTY     with stent 04/07/1995  . APPENDECTOMY     1966  . CARDIOVASCULAR STRESS TEST     07/31/2011  . CARPAL  TUNNEL RELEASE Left   . CHOLECYSTECTOMY     2006  . colonscopy      08/25/2012  . FUNCTIONAL ENDOSCOPIC SINUS SURGERY     11/10/2013  . GASTRIC BYPASS     10/05/2012  . HERNIA REPAIR     left inguinal 1981  . JOINT REPLACEMENT     right knee 04/2006  . left ankle surgery      05/03/2003   . left carpel tunnel      09/18/1993  . left knee meniscal tear      01/25/2010  . left knee meniscal tear repair      05/04/1996  . left rotator cuff repair      05/03/2003   . REPLACEMENT TOTAL KNEE BILATERAL  07/13/2015  . right ankle surgery      fracture has 2 screws 07/07/1997  . right carpel tunnel      05/16/1992  . right shoulder replacement      01/27/2006  . TOTAL KNEE ARTHROPLASTY Left 07/13/2015   Procedure: LEFT TOTAL KNEE ARTHROPLASTY;  Surgeon: Andrew Arabian, MD;  Location: [facility];  Service: Orthopedics;  Laterality: Left;    Home Medications:  Allergies as of 02/25/2017      Reactions   Sulfamethoxazole-trimethoprim    REACTION: Drug rash   Tetracyclines & Related Rash      Medication List  Accurate as of 02/25/17  2:44 PM. Always use your most recent med list.          cetirizine 10 MG tablet Commonly known as:  ZYRTEC Take 10 mg by mouth daily.   ciprofloxacin 500 MG tablet Commonly known as:  CIPRO Take 1 tablet (500 mg total) by mouth every 12 (twelve) hours.   clindamycin 300 MG capsule Commonly known as:  CLEOCIN Take 1 capsule (300 mg total) by mouth 3 (three) times daily.   glucose blood test strip 1 each by Other route 2 (two) times daily. DX E11.9   lisinopril 10 MG tablet Commonly known as:  PRINIVIL,ZESTRIL Take 1 tablet (10 mg total) by mouth daily.   lisinopril 10 MG tablet Commonly known as:  PRINIVIL,ZESTRIL Take 1 tablet (10 mg total) by mouth daily.   Magnesium 250 MG Tabs Take by mouth.   metFORMIN 500 MG tablet Commonly known as:  GLUCOPHAGE Take 1 tablet (500 mg total) by mouth 2 (two) times daily with a meal.     MULTIVITAMIN PO Take by mouth.   onetouch ultrasoft lancets 1 each by Other route 2 (two) times daily. Dx E11.9   traMADol 50 MG tablet Commonly known as:  ULTRAM Take 1 tablet (50 mg total) by mouth every 6 (six) hours as needed.   valACYclovir 500 MG tablet Commonly known as:  VALTREX Take 1 tablet (500 mg total) by mouth daily.   Vitamin D3 5000 units Caps Take by mouth.            Discharge Care Instructions        Start     Ordered   02/25/17 0000  US Scrotum    Question Answer Comment  Reason for Exam (SYMPTOM  OR DIAGNOSIS REQUIRED) srotal abscess   Preferred imaging location? [facility] Andrew Cobb      02/25/17 1442   02/25/17 0000  [place] Art/Ven Flow Abd Pelv Doppler    Question Answer Comment  Reason for Exam (SYMPTOM  OR DIAGNOSIS REQUIRED) srotal abscess   Preferred imaging location? [facility] Andrew Cobb      02/25/17 1442   02/25/17 0000  clindamycin (CLEOCIN) 300 MG capsule  3 times daily    Question:  Supervising Provider  Answer:  Andrew Cobb   02/25/17 1444      Allergies:  Allergies  Allergen Reactions  . Sulfamethoxazole-Trimethoprim     REACTION: Drug rash  . Tetracyclines & Related Rash    Family History: Family History  Problem Relation Age of Onset  . Cancer Mother        pancreatic  . Diabetes Mother   . Stroke Mother   . Heart disease Mother   . Hyperlipidemia Mother   . Hypertension Mother   . Heart attack Mother   . Heart disease Father   . Stroke Father   . Diabetes Father   . Hypertension Father   . Heart attack Father   . Hyperlipidemia Father   . Pancreatic cancer Father   . Cancer Sister   . Cancer Brother        lung  . Cancer Brother   . Kidney cancer Neg Hx   . Bladder Cancer Neg Hx   . Prostate cancer Neg Hx     Social History:  reports that he quit smoking about 32 years ago. His smoking use included Cigarettes. He has a 10.00 pack-year smoking history. He has never used smokeless tobacco. He  reports that he does not drink alcohol  or use drugs.   Review of Systems      Physical Exam: BP 129/74   Pulse 65   Ht 5' 10.5" (1.791 m)   Wt 209 lb 4.8 oz (94.9 kg)   BMI 29.61 kg/m   Constitutional: Well nourished. Alert and oriented, No acute distress. HEENT: Andrew Cobb AT, moist mucus membranes. Trachea midline, no masses. Cardiovascular: No clubbing, cyanosis, or edema. Respiratory: Normal respiratory effort, no increased work of breathing. GI: Abdomen is soft, non tender, non distended, no abdominal masses. Liver and spleen not palpable.  No hernias appreciated.  Stool sample for occult testing is not indicated.   GU: No CVA tenderness.  No bladder fullness or masses.  Patient with circumcised phallus.  Urethral meatus is patent.  No penile discharge. No penile lesions or rashes. Right scrotum with open wound (5 mm)  Scant amount of drainage is expressed from the wound with palpation.  An encapsulated area 6 cm x 4 cm tracking into the perineal area to the base of the scrotum.  No fluctuance or crepitus is noted today.  Examined with Dr. Erlene Quan.  Testicles are located scrotally bilaterally. No masses are appreciated in the testicles. Left and right epididymis are normal. Skin: No rashes, bruises or suspicious lesions. Lymph: No cervical or inguinal adenopathy. Neurologic: Grossly intact, no focal deficits, moving all 4 extremities. Psychiatric: Normal mood and affect.  Laboratory Data: Lab Results  Component Value Date   WBC 3.5 07/17/2016   HGB 14.8 07/17/2016   HCT 45.3 07/17/2016   MCV 93 07/17/2016   PLT 145 (L) 07/17/2016    Lab Results  Component Value Date   CREATININE 1.19 10/08/2016    Pertinent Imaging CLINICAL DATA:  Scrotal pain and swelling for 6 days. Scrotal abscess. History of left inguinal hernia repair 25 years ago.  EXAM: SCROTAL ULTRASOUND  DOPPLER ULTRASOUND OF THE TESTICLES  TECHNIQUE: Complete ultrasound examination of the testicles,  epididymis, and other scrotal structures was performed. Color and spectral Doppler ultrasound were also utilized to evaluate blood flow to the testicles.  COMPARISON:  Abdominopelvic CT 07/11/2012.  FINDINGS: Right testicle  Measurements: 3.8 x 2.0 x 2.9 cm. No mass or microlithiasis visualized. Normal blood flow with color Doppler.  Left testicle  Measurements: 3.5 x 2.3 x 2.6 cm. No mass or microlithiasis visualized. Normal blood flow with color Doppler.  Right epididymis:  Normal in size and appearance.  Left epididymis: There is a small septated cyst measuring 11 mm maximally.  Hydrocele: There are small bilateral hydroceles. There is scrotal wall thickening bilaterally. There is a heterogeneous area of soft tissue thickening along the posterior inferior aspect of the right scrotum, measuring up to 4.5 x 2.4 x 4.3 cm. This demonstrates hyperemia with color Doppler. There is no drainable fluid collection.  Pulsed Doppler interrogation of both testes demonstrates normal low resistance arterial and venous waveforms bilaterally.  IMPRESSION: 1. Diffuse scrotal wall thickening with probable phlegmon involving the posterior inferior right scrotum. No drainable abscess identified. Close clinical follow up recommended. 2. Small bilateral hydroceles. 3. Both testes appear normal.   Electronically Signed   By: Richardean Sale M.D.   On: 02/25/2017 17:31   Assessment & Plan:   1. Recurrent Scrotal Abscess  - wound cultures are still pending   - patient on Cipro and Clindamycin  - patient will be scheduled for scrotal exploration and I & D of abscess tomorrow - procedure explained to the patient - risks such as bleeding, pain, scarring, injury  to surrounding structures and return of abscess discussed with patient        Zara Council, [place] [address], [place], [place] [address] [phone]

## 2017-02-25 ENCOUNTER — Ambulatory Visit (INDEPENDENT_AMBULATORY_CARE_PROVIDER_SITE_OTHER): Payer: PPO | Admitting: Urology

## 2017-02-25 ENCOUNTER — Encounter: Payer: Self-pay | Admitting: Urology

## 2017-02-25 ENCOUNTER — Other Ambulatory Visit: Payer: Self-pay | Admitting: Radiology

## 2017-02-25 ENCOUNTER — Ambulatory Visit
Admission: RE | Admit: 2017-02-25 | Discharge: 2017-02-25 | Disposition: A | Discharge status: Home / Self Care | Payer: PPO | Source: Ambulatory Visit | Attending: Urology | Admitting: Urology

## 2017-02-25 ENCOUNTER — Telehealth: Payer: Self-pay | Admitting: Radiology

## 2017-02-25 VITALS — BP 129/74 | HR 65 | Ht 70.5 in | Wt 209.3 lb

## 2017-02-25 DIAGNOSIS — N492 Inflammatory disorders of scrotum: Secondary | ICD-10-CM | POA: Diagnosis not present

## 2017-02-25 DIAGNOSIS — N433 Hydrocele, unspecified: Secondary | ICD-10-CM | POA: Diagnosis not present

## 2017-02-25 DIAGNOSIS — N503 Cyst of epididymis: Secondary | ICD-10-CM | POA: Diagnosis not present

## 2017-02-25 DIAGNOSIS — N5082 Scrotal pain: Secondary | ICD-10-CM | POA: Diagnosis not present

## 2017-02-25 MED ORDER — CLINDAMYCIN HCL 300 MG PO CAPS: 300.0000 mg | ORAL_CAPSULE | Freq: Three times a day (TID) | ORAL | 0 refills | Status: DC

## 2017-02-25 MED ORDER — CLINDAMYCIN PHOSPHATE 600 MG/50ML IV SOLN
600.0000 mg | INTRAVENOUS | Status: AC
  Administered 2017-02-26: 600 mg via INTRAVENOUS

## 2017-02-25 NOTE — Progress Notes (Unsigned)
error 

## 2017-02-25 NOTE — Telephone Encounter (Signed)
Pt scheduled for I&D of scrotal abscess with scrotal exploration on 02/26/17. Pt made aware of surgery date, arrival time to pre-admit testing, to be npo after midnight & have a driver present for procedure. Advised pt to hold medications except to take lisinopril with a sip of water. Questions answered. Pt voices understanding.

## 2017-02-26 ENCOUNTER — Encounter
Admission: RE | Disposition: A | Discharge status: Home / Self Care | Payer: Self-pay | Source: Ambulatory Visit | Attending: Urology

## 2017-02-26 ENCOUNTER — Ambulatory Visit
Admission: RE | Admit: 2017-02-26 | Discharge: 2017-02-26 | Disposition: A | Discharge status: Home / Self Care | Payer: PPO | Source: Ambulatory Visit | Attending: Urology | Admitting: Urology

## 2017-02-26 ENCOUNTER — Encounter: Payer: Self-pay | Admitting: *Deleted

## 2017-02-26 ENCOUNTER — Ambulatory Visit: Payer: PPO | Admitting: Anesthesiology

## 2017-02-26 DIAGNOSIS — E119 Type 2 diabetes mellitus without complications: Secondary | ICD-10-CM | POA: Diagnosis not present

## 2017-02-26 DIAGNOSIS — Z7984 Long term (current) use of oral hypoglycemic drugs: Secondary | ICD-10-CM | POA: Diagnosis not present

## 2017-02-26 DIAGNOSIS — Z8614 Personal history of Methicillin resistant Staphylococcus aureus infection: Secondary | ICD-10-CM | POA: Insufficient documentation

## 2017-02-26 DIAGNOSIS — K219 Gastro-esophageal reflux disease without esophagitis: Secondary | ICD-10-CM | POA: Insufficient documentation

## 2017-02-26 DIAGNOSIS — Z955 Presence of coronary angioplasty implant and graft: Secondary | ICD-10-CM | POA: Insufficient documentation

## 2017-02-26 DIAGNOSIS — Z96653 Presence of artificial knee joint, bilateral: Secondary | ICD-10-CM | POA: Insufficient documentation

## 2017-02-26 DIAGNOSIS — Z87891 Personal history of nicotine dependence: Secondary | ICD-10-CM | POA: Diagnosis not present

## 2017-02-26 DIAGNOSIS — J45909 Unspecified asthma, uncomplicated: Secondary | ICD-10-CM | POA: Diagnosis not present

## 2017-02-26 DIAGNOSIS — G4730 Sleep apnea, unspecified: Secondary | ICD-10-CM | POA: Insufficient documentation

## 2017-02-26 DIAGNOSIS — I10 Essential (primary) hypertension: Secondary | ICD-10-CM | POA: Diagnosis not present

## 2017-02-26 DIAGNOSIS — N492 Inflammatory disorders of scrotum: Secondary | ICD-10-CM | POA: Diagnosis not present

## 2017-02-26 DIAGNOSIS — I252 Old myocardial infarction: Secondary | ICD-10-CM | POA: Insufficient documentation

## 2017-02-26 DIAGNOSIS — Z9884 Bariatric surgery status: Secondary | ICD-10-CM | POA: Insufficient documentation

## 2017-02-26 DIAGNOSIS — Z79899 Other long term (current) drug therapy: Secondary | ICD-10-CM | POA: Insufficient documentation

## 2017-02-26 DIAGNOSIS — I2510 Atherosclerotic heart disease of native coronary artery without angina pectoris: Secondary | ICD-10-CM | POA: Diagnosis not present

## 2017-02-26 DIAGNOSIS — Z96611 Presence of right artificial shoulder joint: Secondary | ICD-10-CM | POA: Diagnosis not present

## 2017-02-26 DIAGNOSIS — M1990 Unspecified osteoarthritis, unspecified site: Secondary | ICD-10-CM | POA: Diagnosis not present

## 2017-02-26 DIAGNOSIS — Z8546 Personal history of malignant neoplasm of prostate: Secondary | ICD-10-CM | POA: Diagnosis not present

## 2017-02-26 HISTORY — PX: INCISION AND DRAINAGE ABSCESS: SHX5864

## 2017-02-26 HISTORY — PX: SCROTAL EXPLORATION: SHX2386

## 2017-02-26 HISTORY — DX: Malignant (primary) neoplasm, unspecified: C80.1

## 2017-02-26 LAB — GLUCOSE, CAPILLARY
GLUCOSE-CAPILLARY: 150 mg/dL — AB (ref 65–99)
GLUCOSE-CAPILLARY: 111 mg/dL — AB (ref 65–99)

## 2017-02-26 LAB — CBC
HEMATOCRIT: 40.8 % (ref 40.0–52.0)
Hemoglobin: 13.8 g/dL (ref 13.0–18.0)
MCH: 30.9 pg (ref 26.0–34.0)
MCHC: 33.8 g/dL (ref 32.0–36.0)
MCV: 91.6 fL (ref 80.0–100.0)
PLATELETS: 174 10*3/uL (ref 150–440)
RBC: 4.45 MIL/uL (ref 4.40–5.90)
RDW: 13 % (ref 11.5–14.5)
WBC: 5.4 10*3/uL (ref 3.8–10.6)

## 2017-02-26 LAB — BASIC METABOLIC PANEL
Anion gap: 6 (ref 5–15)
BUN: 27 mg/dL — AB (ref 6–20)
CHLORIDE: 102 mmol/L (ref 101–111)
CO2: 28 mmol/L (ref 22–32)
CREATININE: 1.16 mg/dL (ref 0.61–1.24)
Calcium: 9.3 mg/dL (ref 8.9–10.3)
GFR calc Af Amer: >60 mL/min (ref >60)
GFR calc non Af Amer: >60 mL/min (ref >60)
GLUCOSE: 168 mg/dL — AB (ref 65–99)
POTASSIUM: 4.6 mmol/L (ref 3.5–5.1)
Sodium: 136 mmol/L (ref 135–145)

## 2017-02-26 LAB — WOUND CULTURE

## 2017-02-26 SURGERY — EXPLORATION, SCROTUM
Anesthesia: General | Laterality: Right | Wound class: Dirty or Infected

## 2017-02-26 MED ORDER — PROPOFOL 10 MG/ML IV BOLUS
INTRAVENOUS | Status: AC
  Filled 2017-02-26: qty 20

## 2017-02-26 MED ORDER — EPHEDRINE SULFATE 50 MG/ML IJ SOLN
INTRAMUSCULAR | Status: DC | PRN
Start: 2017-02-26 — End: 2017-02-26
  Administered 2017-02-26 (×3): 10 mg via INTRAVENOUS

## 2017-02-26 MED ORDER — SODIUM CHLORIDE 0.9 % IV SOLN
INTRAVENOUS | Status: DC
  Administered 2017-02-26 (×2): via INTRAVENOUS

## 2017-02-26 MED ORDER — OXYCODONE HCL 5 MG/5ML PO SOLN: 5.0000 mg | Freq: Once | ORAL | Status: DC | PRN

## 2017-02-26 MED ORDER — MIDAZOLAM HCL 2 MG/2ML IJ SOLN
INTRAMUSCULAR | Status: DC | PRN
  Administered 2017-02-26: 2 mg via INTRAVENOUS

## 2017-02-26 MED ORDER — LIDOCAINE HCL (PF) 1 % IJ SOLN
INTRAMUSCULAR | Status: AC
  Filled 2017-02-26: qty 30

## 2017-02-26 MED ORDER — FAMOTIDINE 20 MG PO TABS
ORAL_TABLET | ORAL | Status: AC
  Filled 2017-02-26: qty 1

## 2017-02-26 MED ORDER — FENTANYL CITRATE (PF) 100 MCG/2ML IJ SOLN
INTRAMUSCULAR | Status: AC
  Filled 2017-02-26: qty 2

## 2017-02-26 MED ORDER — MIDAZOLAM HCL 2 MG/2ML IJ SOLN
INTRAMUSCULAR | Status: AC
  Filled 2017-02-26: qty 2

## 2017-02-26 MED ORDER — FENTANYL CITRATE (PF) 100 MCG/2ML IJ SOLN
INTRAMUSCULAR | Status: DC | PRN
  Administered 2017-02-26: 50 ug via INTRAVENOUS

## 2017-02-26 MED ORDER — PHENYLEPHRINE HCL 10 MG/ML IJ SOLN: INTRAMUSCULAR | Status: DC | PRN

## 2017-02-26 MED ORDER — FENTANYL CITRATE (PF) 100 MCG/2ML IJ SOLN
INTRAMUSCULAR | Status: AC
  Administered 2017-02-26: 25 ug via INTRAVENOUS
  Filled 2017-02-26: qty 2

## 2017-02-26 MED ORDER — LIDOCAINE HCL (PF) 2 % IJ SOLN
INTRAMUSCULAR | Status: AC
  Filled 2017-02-26: qty 2

## 2017-02-26 MED ORDER — BUPIVACAINE HCL (PF) 0.5 % IJ SOLN
INTRAMUSCULAR | Status: AC
Start: 2017-02-26 — End: ?
  Filled 2017-02-26: qty 30

## 2017-02-26 MED ORDER — ONDANSETRON HCL 4 MG/2ML IJ SOLN
INTRAMUSCULAR | Status: AC
  Filled 2017-02-26: qty 2

## 2017-02-26 MED ORDER — EPHEDRINE SULFATE 50 MG/ML IJ SOLN
INTRAMUSCULAR | Status: AC
  Filled 2017-02-26: qty 1

## 2017-02-26 MED ORDER — HYDROCODONE-ACETAMINOPHEN 5-325 MG PO TABS: 1.0000 | ORAL_TABLET | Freq: Four times a day (QID) | ORAL | 0 refills | Status: DC | PRN

## 2017-02-26 MED ORDER — CIPROFLOXACIN HCL 500 MG PO TABS: 500.0000 mg | ORAL_TABLET | Freq: Two times a day (BID) | ORAL | 0 refills | Status: DC

## 2017-02-26 MED ORDER — PROPOFOL 10 MG/ML IV BOLUS
INTRAVENOUS | Status: DC | PRN
  Administered 2017-02-26: 50 mg via INTRAVENOUS
  Administered 2017-02-26: 150 mg via INTRAVENOUS

## 2017-02-26 MED ORDER — CLINDAMYCIN PHOSPHATE 600 MG/50ML IV SOLN
INTRAVENOUS | Status: AC
  Filled 2017-02-26: qty 50

## 2017-02-26 MED ORDER — FAMOTIDINE 20 MG PO TABS
20.0000 mg | ORAL_TABLET | Freq: Once | ORAL | Status: AC
  Administered 2017-02-26: 20 mg via ORAL

## 2017-02-26 MED ORDER — OXYCODONE HCL 5 MG PO TABS: 5.0000 mg | ORAL_TABLET | Freq: Once | ORAL | Status: DC | PRN

## 2017-02-26 MED ORDER — ONDANSETRON HCL 4 MG/2ML IJ SOLN
INTRAMUSCULAR | Status: DC | PRN
  Administered 2017-02-26: 4 mg via INTRAVENOUS

## 2017-02-26 MED ORDER — LIDOCAINE HCL (CARDIAC) 20 MG/ML IV SOLN
INTRAVENOUS | Status: DC | PRN
  Administered 2017-02-26: 100 mg via INTRAVENOUS

## 2017-02-26 MED ORDER — FENTANYL CITRATE (PF) 100 MCG/2ML IJ SOLN
25.0000 ug | INTRAMUSCULAR | Status: DC | PRN
  Administered 2017-02-26 (×4): 25 ug via INTRAVENOUS

## 2017-02-26 SURGICAL SUPPLY — 56 items
ADH SKN CLS APL DERMABOND .7 (GAUZE/BANDAGES/DRESSINGS)
BLADE SURG 15 STRL LF DISP TIS (BLADE) ×1 IMPLANT
BLADE SURG 15 STRL SS (BLADE) ×2
BNDG CMPR 75X21 PLY HI ABS (MISCELLANEOUS)
CANISTER SUCT 1200ML W/VALVE (MISCELLANEOUS) ×2 IMPLANT
CHLORAPREP W/TINT 26ML (MISCELLANEOUS) ×2 IMPLANT
DERMABOND ADVANCED (GAUZE/BANDAGES/DRESSINGS)
DERMABOND ADVANCED .7 DNX12 (GAUZE/BANDAGES/DRESSINGS) ×1 IMPLANT
DRAIN PENROSE 1/4X12 LTX (DRAIN) ×2 IMPLANT
DRAPE LAPAROTOMY 100X77 ABD (DRAPES) ×2 IMPLANT
DRAPE LAPAROTOMY 77X122 PED (DRAPES) ×2 IMPLANT
DRAPE LEGGINS SURG 28X43 STRL (DRAPES) ×1 IMPLANT
DRAPE UNDER BUTTOCK W/FLU (DRAPES) ×1 IMPLANT
DRSG TEGADERM 4X4.75 (GAUZE/BANDAGES/DRESSINGS) ×1 IMPLANT
DRSG TELFA 4X3 1S NADH ST (GAUZE/BANDAGES/DRESSINGS) ×1 IMPLANT
ELECT REM PT RETURN 9FT ADLT (ELECTROSURGICAL) ×2
ELECTRODE REM PT RTRN 9FT ADLT (ELECTROSURGICAL) ×1 IMPLANT
GAUZE FLUFF 18X24 1PLY STRL (GAUZE/BANDAGES/DRESSINGS) ×2 IMPLANT
GAUZE PACKING IODOFORM 1/2 (PACKING) ×1 IMPLANT
GAUZE SPONGE 4X4 12PLY STRL (GAUZE/BANDAGES/DRESSINGS) ×1 IMPLANT
GAUZE STRETCH 2X75IN STRL (MISCELLANEOUS) ×1 IMPLANT
GLOVE BIO SURGEON STRL SZ 6.5 (GLOVE) ×2 IMPLANT
GLOVE BIO SURGEON STRL SZ7 (GLOVE) ×2 IMPLANT
GLOVE INDICATOR 7.0 STRL GRN (GLOVE) ×2 IMPLANT
GOWN STRL REUS W/ TWL LRG LVL3 (GOWN DISPOSABLE) ×2 IMPLANT
GOWN STRL REUS W/TWL LRG LVL3 (GOWN DISPOSABLE) ×4
KIT RM TURNOVER STRD PROC AR (KITS) ×2 IMPLANT
LABEL OR SOLS (LABEL) ×1 IMPLANT
NDL HYPO 25X1 1.5 SAFETY (NEEDLE) ×1 IMPLANT
NEEDLE HYPO 25X1 1.5 SAFETY (NEEDLE) IMPLANT
NS IRRIG 500ML POUR BTL (IV SOLUTION) ×2 IMPLANT
PACK BASIN MINOR ARMC (MISCELLANEOUS) ×2 IMPLANT
PREP PVP WINGED SPONGE (MISCELLANEOUS) ×2 IMPLANT
SET INTRO CAPELLA COAXIAL (SET/KITS/TRAYS/PACK) ×1 IMPLANT
SOL PREP PVP 2OZ (MISCELLANEOUS) ×2
SOLUTION PREP PVP 2OZ (MISCELLANEOUS) ×1 IMPLANT
SPONGE KITTNER 5P (MISCELLANEOUS) ×1 IMPLANT
STRIP CLOSURE SKIN 1/2X4 (GAUZE/BANDAGES/DRESSINGS) ×1 IMPLANT
SUPPORETR ATHLETIC LG (MISCELLANEOUS) ×1 IMPLANT
SUPPORT SCROTAL LRG NO STRP (SOFTGOODS) ×1 IMPLANT
SUPPORTER ATHLETIC LG (MISCELLANEOUS) ×2
SUT CHROMIC 3 0 SH 27 (SUTURE) ×1 IMPLANT
SUT CHROMIC 4 0 SH 27 (SUTURE) ×1 IMPLANT
SUT CHROMIC BR 1/2CLE 2-0 54IN (SUTURE) ×1 IMPLANT
SUT ETHILON 3-0 FS-10 30 BLK (SUTURE)
SUT PLAIN 3 0 SH 27IN (SUTURE) ×1 IMPLANT
SUT SILK 2 0 SH (SUTURE) ×1 IMPLANT
SUT SURGILON NYL 2 0 T19 M (SUTURE) ×1 IMPLANT
SUT VIC AB 3-0 SH 27 (SUTURE)
SUT VIC AB 3-0 SH 27X BRD (SUTURE) ×2 IMPLANT
SUT VIC AB 4-0 PS2 18 (SUTURE) ×1 IMPLANT
SUT VIC AB 4-0 SH 27 (SUTURE)
SUT VIC AB 4-0 SH 27XANBCTRL (SUTURE) ×1 IMPLANT
SUTURE EHLN 3-0 FS-10 30 BLK (SUTURE) ×1 IMPLANT
SYR BULB IRRIG 60ML STRL (SYRINGE) ×1 IMPLANT
SYRINGE 10CC LL (SYRINGE) ×1 IMPLANT

## 2017-02-26 NOTE — Discharge Instructions (Signed)
AMBULATORY SURGERY  °DISCHARGE INSTRUCTIONS ° ° °1) The drugs that you were given will stay in your system until tomorrow so for the next 24 hours you should not: ° °A) Drive an automobile °B) Make any legal decisions °C) Drink any alcoholic beverage ° ° °2) You may resume regular meals tomorrow.  Today it is better to start with liquids and gradually work up to solid foods. ° °You may eat anything you prefer, but it is better to start with liquids, then soup and crackers, and gradually work up to solid foods. ° ° °3) Please notify your doctor immediately if you have any unusual bleeding, trouble breathing, redness and pain at the surgery site, drainage, fever, or pain not relieved by medication. ° ° ° °4) Additional Instructions: ° ° ° ° ° ° ° °Please contact your physician with any problems or Same Day Surgery at [phone], Monday through Friday 6 am to 4 pm, or [place] [facility] Main number at [phone]. °

## 2017-02-26 NOTE — Progress Notes (Signed)
[place] made initial visit to [facility]. Pt was awaiting a procedure. Pt stated that his pastor had just left and he is in "hurry up and wait" mode. [place] engaged Pt in conversation and had prayer for good results.     02/26/17 1200  Clinical Encounter Type  Visited With Patient;Patient and family together;Health care provider  Visit Type Initial;Spiritual support;Pre-op  Consult/Referral To Chaplain

## 2017-02-26 NOTE — Anesthesia Postprocedure Evaluation (Signed)
Anesthesia Post Note  Patient: AHYAN KREEGER  Procedure(s) Performed: Procedure(s) (LRB): SCROTUM EXPLORATION (Right) INCISION AND DRAINAGE ABSCESS (Right)  Patient location during evaluation: PACU Anesthesia Type: General Level of consciousness: awake and alert Pain management: pain level controlled Vital Signs Assessment: post-procedure vital signs reviewed and stable Respiratory status: spontaneous breathing, nonlabored ventilation, respiratory function stable and patient connected to nasal cannula oxygen Cardiovascular status: blood pressure returned to baseline and stable Postop Assessment: no signs of nausea or vomiting Anesthetic complications: no     Last Vitals:  Vitals:   02/26/17 1415 02/26/17 1430  BP: 136/85 (!) 168/59  Pulse: 67 64  Resp: 14 16  Temp: 36.6 C 36.4 C  SpO2: 100% 99%    Last Pain:  Vitals:   02/26/17 1430  TempSrc: Temporal  PainSc: 3                  Precious Haws [place]iscitello

## 2017-02-26 NOTE — Anesthesia Procedure Notes (Signed)
Procedure Name: LMA Insertion Date/Time: 02/26/2017 1:02 PM Performed by: Silvana Newness Pre-anesthesia Checklist: Patient identified, Emergency Drugs available, Suction available, Patient being monitored and Timeout performed Patient Re-evaluated:Patient Re-evaluated prior to induction Oxygen Delivery Method: Circle system utilized Preoxygenation: Pre-oxygenation with 100% oxygen Induction Type: IV induction Ventilation: Mask ventilation without difficulty LMA: LMA inserted LMA Size: 4.0 Number of attempts: 1 Placement Confirmation: positive ETCO2 and breath sounds checked- equal and bilateral Tube secured with: Tape Dental Injury: Teeth and Oropharynx as per pre-operative assessment

## 2017-02-26 NOTE — Anesthesia Post-op Follow-up Note (Signed)
Anesthesia QCDR form completed.        

## 2017-02-26 NOTE — Transfer of Care (Signed)
Immediate Anesthesia Transfer of Care Note  Patient: Andrew Cobb  Procedure(s) Performed: Procedure(s): SCROTUM EXPLORATION (Right) INCISION AND DRAINAGE ABSCESS (Right)  Patient Location: PACU  Anesthesia Type:General  Level of Consciousness: awake, oriented and patient cooperative  Airway & Oxygen Therapy: Patient Spontanous Breathing and Patient connected to face mask oxygen  Post-op Assessment: Report given to RN, Post -op Vital signs reviewed and stable and Patient moving all extremities X 4  Post vital signs: Reviewed and stable  Last Vitals:  Vitals:   02/26/17 1017 02/26/17 1343  BP: 133/88   Pulse: 65   Resp: 18   Temp: (!) 36.1 C 36.8 C  SpO2: 97%     Last Pain:  Vitals:   02/26/17 1017  TempSrc: Temporal  PainSc: 4          Complications: No apparent anesthesia complications

## 2017-02-26 NOTE — Interval H&P Note (Signed)
History and Physical Interval Note:  02/26/2017 12:28 PM  Andrew Cobb  has presented today for surgery, with the diagnosis of SCROTAL ABSCESS  The various methods of treatment have been discussed with the patient and family. After consideration of risks, benefits and other options for treatment, the patient has consented to  Procedure(s): SCROTUM EXPLORATION (Right) INCISION AND DRAINAGE ABSCESS (Right) as a surgical intervention .  The patient's history has been reviewed, patient examined, no change in status, stable for surgery.  I have reviewed the patient's chart and labs.  Questions were answered to the patient's satisfaction.    Patient with residual abscess. Plan for intraop I and D  RRR Lungs clear  Nickie Retort

## 2017-02-26 NOTE — Anesthesia Preprocedure Evaluation (Signed)
Anesthesia Evaluation  Patient identified by MRN, date of birth, ID band Patient awake    Reviewed: Allergy & Precautions, H&P , NPO status , Patient's Chart, lab work & pertinent test results  Airway Mallampati: III  TM Distance: <3 FB Neck ROM: limited    Dental  (+) Poor Dentition, Chipped   Pulmonary neg shortness of breath, asthma , sleep apnea and Continuous Positive Airway Pressure Ventilation , former smoker,           Cardiovascular Exercise Tolerance: Good hypertension, (-) angina+ CAD, + Past MI and + Cardiac Stents  (-) DOE      Neuro/Psych negative neurological ROS  negative psych ROS   GI/Hepatic Neg liver ROS, GERD  Medicated and Controlled,  Endo/Other  diabetes, Type 2  Renal/GU      Musculoskeletal  (+) Arthritis ,   Abdominal   Peds  Hematology negative hematology ROS (+)   Anesthesia Other Findings Past Medical History: No date: Arthritis No date: Asthma No date: Cancer (HCC)     Comment:  prostate No date: Chronic venous insufficiency     Comment:  varicose vein lower extremity with inflammation No date: GERD (gastroesophageal reflux disease)     Comment:  no issues since gastric bypass surgery as stated per pt No date: Hyperlipidemia No date: Hypogonadism in male No date: MRSA (methicillin resistant Staphylococcus aureus) infection     Comment:  07/30/2008 thru 08/07/2008 No date: Stented coronary artery No date: Thrombocythemia ([facility])  Past Surgical History: No date: ANGIOPLASTY No date: ANGIOPLASTY     Comment:  with stent 04/07/1995 No date: APPENDECTOMY     Comment:  1966 No date: CARDIOVASCULAR STRESS TEST     Comment:  07/31/2011 No date: CARPAL TUNNEL RELEASE; Left No date: CHOLECYSTECTOMY     Comment:  2006 No date: colonscopy      Comment:  08/25/2012 No date: FUNCTIONAL ENDOSCOPIC SINUS SURGERY     Comment:  11/10/2013 No date: GASTRIC BYPASS     Comment:  10/05/2012 No  date: HERNIA REPAIR     Comment:  left inguinal 1981 No date: JOINT REPLACEMENT     Comment:  bilateral No date: left ankle surgery      Comment:  05/03/2003  No date: left carpel tunnel      Comment:  09/18/1993 No date: left knee meniscal tear      Comment:  01/25/2010 No date: left knee meniscal tear repair      Comment:  05/04/1996 No date: left rotator cuff repair      Comment:  05/03/2003  07/13/2015: REPLACEMENT TOTAL KNEE BILATERAL No date: right ankle surgery      Comment:  fracture has 2 screws 07/07/1997 No date: right carpel tunnel      Comment:  05/16/1992 No date: right shoulder replacement      Comment:  01/27/2006 07/13/2015: TOTAL KNEE ARTHROPLASTY; Left     Comment:  Procedure: LEFT TOTAL KNEE ARTHROPLASTY;  Surgeon: Gaynelle Arabian, MD;  Location: [facility];  Service: Orthopedics;                Laterality: Left;  BMI    Body Mass Index:  29.56 kg/m      Reproductive/Obstetrics negative OB ROS                             Anesthesia Physical Anesthesia  Plan  ASA: III  Anesthesia Plan: General LMA   Post-op Pain Management:    Induction: Intravenous  PONV Risk Score and Plan:   Airway Management Planned: LMA  Additional Equipment:   Intra-op Plan:   Post-operative Plan: Extubation in OR  Informed Consent: I have reviewed the patients History and Physical, chart, labs and discussed the procedure including the risks, benefits and alternatives for the proposed anesthesia with the patient or authorized representative who has indicated his/her understanding and acceptance.   Dental Advisory Given  Plan Discussed with: Anesthesiologist, CRNA and Surgeon  Anesthesia Plan Comments: (Patient informed that they are higher risk for complications from anesthesia during this procedure due to their medical history.  Patient voiced understanding.  Patient consented for risks of anesthesia including but not limited to:  -  adverse reactions to medications - damage to teeth, lips or other oral mucosa - sore throat or hoarseness - Damage to heart, brain, lungs or loss of life  Patient voiced understanding.)        Anesthesia Quick Evaluation

## 2017-02-26 NOTE — H&P (View-Only) (Signed)
02/24/2017 10:59 AM   Andrew Cobb 27-Feb-1965 [id number]  Referring provider: Guadalupe Maple, MD [address] [place], [place] [address]  CC: Scrotal abscess  HPI: 66 yo WM who presents today for a recheck on scrotal abscess.  Patient is a 66 year old WM with a history of low risk prostate cancer, BPH with LU TS and recurrent scrotal abscess who presented yesterday with the complaints of recurrence with his wife, [name]  1 - Recurrent Scrotal Abscess - pt with several bouts of MRSA groin-scrotal abscess and cellulitis that has responded to PO meds. He is diabetic.  He states that on Friday he noted a small "pimple" on right scrotum.  It continued to enlarge over the weekend.  He has not had a fever, chills, nausea or vomiting.  He is also experiencing urinary hesitancy.    His abscess was I & D yesterday.    He states last night he had a lot of drainage from the site.  He states he changes the 4 x 4 guaze three times.  His ABD pad and gauze have drainage on them today.  It does not have a foul odor.  He took two tramadol due to pain last night.  He denies any fevers, chills, nausea or vomiting.  PMH: Past Medical History:  Diagnosis Date  . Arthritis   . Asthma   . Chronic venous insufficiency    varicose vein lower extremity with inflammation  . GERD (gastroesophageal reflux disease)    no issues since gastric bypass surgery as stated per pt  . Hyperlipidemia   . Hypogonadism in male   . MRSA (methicillin resistant Staphylococcus aureus) infection    07/30/2008 thru 08/07/2008  . Stented coronary artery   . Thrombocythemia [facility])     Surgical History: Past Surgical History:  Procedure Laterality Date  . ANGIOPLASTY    . ANGIOPLASTY     with stent 04/07/1995  . APPENDECTOMY     1966  . CARDIOVASCULAR STRESS TEST     07/31/2011  . CARPAL TUNNEL RELEASE Left   . CHOLECYSTECTOMY     2006  . colonscopy      08/25/2012  . FUNCTIONAL ENDOSCOPIC SINUS SURGERY     11/10/2013  . GASTRIC BYPASS     10/05/2012  . HERNIA REPAIR     left inguinal 1981  . JOINT REPLACEMENT     right knee 04/2006  . left ankle surgery      05/03/2003   . left carpel tunnel      09/18/1993  . left knee meniscal tear      01/25/2010  . left knee meniscal tear repair      05/04/1996  . left rotator cuff repair      05/03/2003   . REPLACEMENT TOTAL KNEE BILATERAL  07/13/2015  . right ankle surgery      fracture has 2 screws 07/07/1997  . right carpel tunnel      05/16/1992  . right shoulder replacement      01/27/2006  . TOTAL KNEE ARTHROPLASTY Left 07/13/2015   Procedure: LEFT TOTAL KNEE ARTHROPLASTY;  Surgeon: Gaynelle Arabian, MD;  Location: [facility];  Service: Orthopedics;  Laterality: Left;    Home Medications:  Allergies as of 02/24/2017      Reactions   Sulfamethoxazole-trimethoprim    REACTION: Drug rash   Tetracyclines & Related Rash      Medication List       Accurate as of 02/24/17 10:59  AM. Always use your most recent med list.          cetirizine 10 MG tablet Commonly known as:  ZYRTEC Take 10 mg by mouth daily.   ciprofloxacin 500 MG tablet Commonly known as:  CIPRO Take 1 tablet (500 mg total) by mouth every 12 (twelve) hours.   glucose blood test strip 1 each by Other route 2 (two) times daily. DX E11.9   lisinopril 10 MG tablet Commonly known as:  PRINIVIL,ZESTRIL Take 1 tablet (10 mg total) by mouth daily.   lisinopril 10 MG tablet Commonly known as:  PRINIVIL,ZESTRIL Take 1 tablet (10 mg total) by mouth daily.   Magnesium 250 MG Tabs Take by mouth.   metFORMIN 500 MG tablet Commonly known as:  GLUCOPHAGE Take 1 tablet (500 mg total) by mouth 2 (two) times daily with a meal.   MULTIVITAMIN PO Take by mouth.   onetouch ultrasoft lancets 1 each by Other route 2 (two) times daily. Dx E11.9   traMADol 50 MG tablet Commonly known as:  ULTRAM Take 1 tablet (50 mg total) by mouth every 6 (six) hours as needed.   valACYclovir 500  MG tablet Commonly known as:  VALTREX Take 1 tablet (500 mg total) by mouth daily.   Vitamin D3 5000 units Caps Take by mouth.       Allergies:  Allergies  Allergen Reactions  . Sulfamethoxazole-Trimethoprim     REACTION: Drug rash  . Tetracyclines & Related Rash    Family History: Family History  Problem Relation Age of Onset  . Cancer Mother        pancreatic  . Diabetes Mother   . Stroke Mother   . Heart disease Mother   . Hyperlipidemia Mother   . Hypertension Mother   . Heart attack Mother   . Heart disease Father   . Stroke Father   . Diabetes Father   . Hypertension Father   . Heart attack Father   . Hyperlipidemia Father   . Pancreatic cancer Father   . Cancer Sister   . Cancer Brother        lung  . Cancer Brother   . Kidney cancer Neg Hx   . Bladder Cancer Neg Hx   . Prostate cancer Neg Hx     Social History:  reports that he quit smoking about 32 years ago. His smoking use included Cigarettes. He has a 10.00 pack-year smoking history. He has never used smokeless tobacco. He reports that he does not drink alcohol or use drugs.   Review of Systems UROLOGY Frequent Urination?: No Hard to postpone urination?: No Burning/pain with urination?: No Get up at night to urinate?: No Leakage of urine?: No Urine stream starts and stops?: Yes Trouble starting stream?: No Do you have to strain to urinate?: No Blood in urine?: No Urinary tract infection?: No Sexually transmitted disease?: No Injury to kidneys or bladder?: No Painful intercourse?: No Weak stream?: No Erection problems?: No Penile pain?: No Gastrointestinal Nausea?: No Vomiting?: No Indigestion/heartburn?: No Diarrhea?: No Constipation?: No Constitutional Fever: No Night sweats?: No Weight loss?: No Fatigue?: No Skin Skin rash/lesions?: No Itching?: No Eyes Blurred vision?: No Double vision?: No Ears/Nose/Throat Sore throat?: No Sinus problems?:  No Hematologic/Lymphatic Swollen glands?: No Easy bruising?: No Cardiovascular Leg swelling?: No Chest pain?: No Respiratory Cough?: No Shortness of breath?: No Endocrine Excessive thirst?: No Musculoskeletal Back pain?: No Joint pain?: Yes Neurological Headaches?: No Dizziness?: No Psychologic Depression?: No Anxiety?: No  Physical Exam: BP 136/79   Pulse 69   Ht 5' 10.5" (1.791 m)   Wt 210 lb 12.8 oz (95.6 kg)   BMI 29.82 kg/m   Constitutional: Well nourished. Alert and oriented, No acute distress. HEENT: [place] AT, moist mucus membranes. Trachea midline, no masses. Cardiovascular: No clubbing, cyanosis, or edema. Respiratory: Normal respiratory effort, no increased work of breathing. GI: Abdomen is soft, non tender, non distended, no abdominal masses. Liver and spleen not palpable.  No hernias appreciated.  Stool sample for occult testing is not indicated.   GU: No CVA tenderness.  No bladder fullness or masses.  Patient with circumcised phallus.  Urethral meatus is patent.  No penile discharge. No penile lesions or rashes. Right scrotum with open wound (5 mm)  Scant amount of drainage is expressed from the wound with palpation.  No fluctuance or crepitus is noted today.  testicles are located scrotally bilaterally. No masses are appreciated in the testicles. Left and right epididymis are normal. Skin: No rashes, bruises or suspicious lesions. Lymph: No cervical or inguinal adenopathy. Neurologic: Grossly intact, no focal deficits, moving all 4 extremities. Psychiatric: Normal mood and affect.  Laboratory Data: Lab Results  Component Value Date   WBC 3.5 07/17/2016   HGB 14.8 07/17/2016   HCT 45.3 07/17/2016   MCV 93 07/17/2016   PLT 145 (L) 07/17/2016    Lab Results  Component Value Date   CREATININE 1.19 10/08/2016     Procedure Patient's right scrotum is cleansed with Betadine.  The wound is packed with iodoform dressing and covered with 4 x 4 guaze  and abd pad.     Assessment & Plan:   1. Recurrent Scrotal Abscess  - abscess I & D  - will RTC tomorrow  - reviewed red flag signs and instructed to seek care in the ED if any present  - wound cultures sent - started on Cipro 500 mg tid - given Tramadol 50 mg q 6 for pain #10     Royden Purl  [facility] [address] [place], [place] [address] [phone]

## 2017-02-26 NOTE — Op Note (Signed)
Date of procedure: 02/26/17  Preoperative diagnosis:  1. Scrotal abscess   Postoperative diagnosis:  1. Scrotal abscess   Procedure: 1. Incision and drainage of scrotal abscess  Surgeon: Baruch Gouty, MD  Anesthesia: General  Complications: None  Intraoperative findings: The patient had a 6 cm scrotal abscess and indurated area in the posterior right hemiscrotum. All loculations and pockets were broken up with manual dissection. The wound was packed with iodoform.  EBL: None  Specimens: None  Drains: Iodoform packing of the wound  Disposition: Stable to the postanesthesia care unit  Indication for procedure: The patient is a 66 y.o. male with history of MRSA abscess in his right hemiscrotum that failed bedside drainage in the office he presents today for definitive management in the operating room.  After reviewing the management options for treatment, the patient elected to proceed with the above surgical procedure(s). We have discussed the potential benefits and risks of the procedure, side effects of the proposed treatment, the likelihood of the patient achieving the goals of the procedure, and any potential problems that might occur during the procedure or recuperation. Informed consent has been obtained.  Description of procedure: The patient was met in the preoperative area. All risks, benefits, and indications of the procedure were described in great detail. The patient consented to the procedure. Preoperative antibiotics were given. The patient was taken to the operative theater. General anesthesia was induced per the anesthesia service. The patient was then placed in the dorsal lithotomy position and prepped and draped in the usual sterile fashion. A preoperative timeout was called.   The patient RD had a small nick in the right posterior hemiscrotum from his office drainage of his abscess. This was still draining purulent fluid. This nick was extended to approximately 3 cm  in size with a #11 blade. Using both finger and hemostat dissection the loculated indurated areas nose right posterior hemiscrotum were broken. There were less collections that were able to be drained from the abscess. All areas that felt abnormal were manually probed. Once the abscess cavity was completely evacuated it was packed with iodoform gauze. The decision was made not to send a culture specimen as he was already on culture appropriate antibiotics from a wound culture from 2 days ago. The incision was then covered with fluff gauze. Scrotal support was applied.  Plan: The patient will remove 3 inches of packing per day. He will follow up in a few days in our clinic for a wound check. He was given a longer course of antibiotics and pain medications.  Baruch Gouty, M.D.

## 2017-02-27 ENCOUNTER — Encounter: Payer: Self-pay | Admitting: Urology

## 2017-03-02 ENCOUNTER — Encounter: Payer: Self-pay | Admitting: Urology

## 2017-03-02 ENCOUNTER — Ambulatory Visit (INDEPENDENT_AMBULATORY_CARE_PROVIDER_SITE_OTHER): Payer: PPO | Admitting: Urology

## 2017-03-02 VITALS — BP 110/65 | HR 71 | Ht 70.5 in | Wt 205.4 lb

## 2017-03-02 DIAGNOSIS — N492 Inflammatory disorders of scrotum: Secondary | ICD-10-CM

## 2017-03-02 DIAGNOSIS — C61 Malignant neoplasm of prostate: Secondary | ICD-10-CM

## 2017-03-02 NOTE — Progress Notes (Signed)
03/02/2017 9:59 AM   Andrew Cobb Aug 30, 1950 [id number]  Referring provider: Guadalupe Maple, MD [address] [place], [place] [address]  Chief Complaint  Patient presents with  . Wound Check    HPI: 66 yo s/p scrotal I&D 8/20 in office and 8/23 in OR. He is POD#4 today. Wound cx was MRSA. He's on Cipro.   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    PMH: Past Medical History:  Diagnosis Date  . Arthritis   . Asthma   . Cancer [facility])    prostate  . Chronic venous insufficiency    varicose vein lower extremity with inflammation  . GERD (gastroesophageal reflux disease)    no issues since gastric bypass surgery as stated per pt  . Hyperlipidemia   . Hypogonadism in male   . MRSA (methicillin resistant Staphylococcus aureus) infection    07/30/2008 thru 08/07/2008  . Stented coronary artery   . Thrombocythemia [facility])     Surgical History: Past Surgical History:  Procedure Laterality Date  . ANGIOPLASTY    . ANGIOPLASTY     with stent 04/07/1995  . APPENDECTOMY     1966  . CARDIOVASCULAR STRESS TEST     07/31/2011  . CARPAL TUNNEL RELEASE Left   . CHOLECYSTECTOMY     2006  . colonscopy      08/25/2012  . FUNCTIONAL ENDOSCOPIC SINUS SURGERY     11/10/2013  . GASTRIC BYPASS     10/05/2012  . HERNIA REPAIR     left inguinal 1981  . INCISION AND DRAINAGE ABSCESS Right 02/26/2017   Procedure: INCISION AND DRAINAGE ABSCESS;  Surgeon: Nickie Retort, MD;  Location: [facility] ORS;  Service: Urology;  Laterality: Right;  . JOINT REPLACEMENT     bilateral  . left ankle surgery      05/03/2003   . left carpel tunnel      09/18/1993  . left knee meniscal tear      01/25/2010  . left knee meniscal tear repair      05/04/1996  . left rotator cuff repair      05/03/2003   . REPLACEMENT TOTAL KNEE BILATERAL  07/13/2015  . right ankle surgery      fracture has 2 screws 07/07/1997  . right carpel tunnel      05/16/1992  . right  shoulder replacement      01/27/2006  . SCROTAL EXPLORATION Right 02/26/2017   Procedure: SCROTUM EXPLORATION;  Surgeon: Nickie Retort, MD;  Location: [facility] ORS;  Service: Urology;  Laterality: Right;  . TOTAL KNEE ARTHROPLASTY Left 07/13/2015   Procedure: LEFT TOTAL KNEE ARTHROPLASTY;  Surgeon: Gaynelle Arabian, MD;  Location: [facility];  Service: Orthopedics;  Laterality: Left;    Home Medications:  Allergies as of 03/02/2017      Reactions   Sulfamethoxazole-trimethoprim    REACTION: Drug rash   Tetracyclines & Related Rash      Medication List       Accurate as of 03/02/17  9:59 AM. Always use your most recent med list.          cetirizine 10 MG tablet Commonly known as:  ZYRTEC Take 10 mg by mouth daily.   ciprofloxacin 500 MG tablet Commonly known as:  CIPRO Take 1 tablet (500 mg total) by mouth every 12 (twelve) hours.   clindamycin 300 MG capsule Commonly known as:  CLEOCIN Take 1 capsule (300 mg total)  by mouth 3 (three) times daily.   glucose blood test strip 1 each by Other route 2 (two) times daily. DX E11.9   HYDROcodone-acetaminophen 5-325 MG tablet Commonly known as:  NORCO Take 1 tablet by mouth every 6 (six) hours as needed for moderate pain.   lisinopril 10 MG tablet Commonly known as:  PRINIVIL,ZESTRIL Take 1 tablet (10 mg total) by mouth daily.   Magnesium 250 MG Tabs Take by mouth.   metFORMIN 500 MG tablet Commonly known as:  GLUCOPHAGE Take 1 tablet (500 mg total) by mouth 2 (two) times daily with a meal.   MULTIVITAMIN PO Take by mouth.   onetouch ultrasoft lancets 1 each by Other route 2 (two) times daily. Dx E11.9   traMADol 50 MG tablet Commonly known as:  ULTRAM Take 1 tablet (50 mg total) by mouth every 6 (six) hours as needed.   valACYclovir 500 MG tablet Commonly known as:  VALTREX Take 1 tablet (500 mg total) by mouth daily.   Vitamin D3 5000 units Caps Take by mouth.       Allergies:  Allergies  Allergen Reactions    . Sulfamethoxazole-Trimethoprim     REACTION: Drug rash  . Tetracyclines & Related Rash    Family History: Family History  Problem Relation Age of Onset  . Cancer Mother        pancreatic  . Diabetes Mother   . Stroke Mother   . Heart disease Mother   . Hyperlipidemia Mother   . Hypertension Mother   . Heart attack Mother   . Heart disease Father   . Stroke Father   . Diabetes Father   . Hypertension Father   . Heart attack Father   . Hyperlipidemia Father   . Pancreatic cancer Father   . Cancer Sister   . Cancer Brother        lung  . Cancer Brother   . Kidney cancer Neg Hx   . Bladder Cancer Neg Hx   . Prostate cancer Neg Hx     Social History:  reports that he quit smoking about 32 years ago. His smoking use included Cigarettes. He has a 10.00 pack-year smoking history. He has never used smokeless tobacco. He reports that he does not drink alcohol or use drugs.  ROS: UROLOGY Frequent Urination?: No Hard to postpone urination?: No Burning/pain with urination?: No Get up at night to urinate?: No Leakage of urine?: No Urine stream starts and stops?: No Trouble starting stream?: No Do you have to strain to urinate?: No Blood in urine?: No Urinary tract infection?: No Sexually transmitted disease?: No Injury to kidneys or bladder?: No Painful intercourse?: No Weak stream?: No Erection problems?: No Penile pain?: No  Gastrointestinal Nausea?: No Vomiting?: No Indigestion/heartburn?: No Diarrhea?: No Constipation?: No  Constitutional Fever: No Night sweats?: No Weight loss?: No Fatigue?: No  Skin Skin rash/lesions?: No Itching?: No  Eyes Blurred vision?: No Double vision?: No  Ears/Nose/Throat Sore throat?: No Sinus problems?: No  Hematologic/Lymphatic Swollen glands?: No Easy bruising?: No  Cardiovascular Leg swelling?: No Chest pain?: No  Respiratory Cough?: No Shortness of breath?: No  Endocrine Excessive thirst?:  No  Musculoskeletal Back pain?: No Joint pain?: Yes  Neurological Headaches?: No Dizziness?: No  Psychologic Depression?: No Anxiety?: No  Physical Exam: BP 110/65 (BP Location: Left Arm, Patient Position: Sitting, Cuff Size: Normal)   Pulse 71   Ht 5' 10.5" (1.791 m)   Wt 93.2 kg (205 lb 6.4 oz)  BMI 29.06 kg/m   Constitutional:  Alert and oriented, No acute distress. HEENT: [place] AT, moist mucus membranes.  Trachea midline, no masses. Cardiovascular: No clubbing, cyanosis, or edema. Respiratory: Normal respiratory effort, no increased work of breathing. GI: Abdomen is soft, nontender, nondistended, no abdominal masses GU: No CVA tenderness.  Scrotum: skin looks healthy with no erythema or induration or fluctuance. Packing removed and wound looks healthy, beefy red granulation. I repacked and taught wife how to pack.  Skin: No rashes, bruises or suspicious lesions. Lymph: No cervical or inguinal adenopathy. Neurologic: Grossly intact, no focal deficits, moving all 4 extremities. Psychiatric: Normal mood and affect.  Laboratory Data: Lab Results  Component Value Date   WBC 5.4 02/26/2017   HGB 13.8 02/26/2017   HCT 40.8 02/26/2017   MCV 91.6 02/26/2017   PLT 174 02/26/2017    Lab Results  Component Value Date   CREATININE 1.16 02/26/2017    No results found for: PSA  No results found for: TESTOSTERONE  Lab Results  Component Value Date   HGBA1C 6.0 (H) 10/08/2015    Urinalysis    Component Value Date/Time   COLORURINE YELLOW 07/05/2015 1002   APPEARANCEUR Clear 02/23/2017 0935   LABSPEC 1.018 07/05/2015 1002   PHURINE 5.5 07/05/2015 1002   GLUCOSEU Negative 02/23/2017 0935   HGBUR NEGATIVE 07/05/2015 1002   BILIRUBINUR Negative 02/23/2017 0935   KETONESUR NEGATIVE 07/05/2015 1002   PROTEINUR 1+ (A) 02/23/2017 0935   PROTEINUR NEGATIVE 07/05/2015 1002   UROBILINOGEN 0.2 07/11/2012 1443   NITRITE Negative 02/23/2017 0935   NITRITE NEGATIVE  07/05/2015 1002   LEUKOCYTESUR Negative 02/23/2017 0935     Assessment & Plan:    Scrotal abcess - healing well. Taught wife to pack. Change packing daily. Pt can shower. See in 1 week. Complete cipro. He doesn't need further abx.   PCa - f/u in December as planned for PSA.   There are no diagnoses linked to this encounter.  No Follow-up on file.  Festus Aloe, [facility] [address], [place], [place] [address] [phone]

## 2017-03-07 NOTE — Progress Notes (Signed)
03/10/2017 10:20 AM   Andrew Cobb 1951/01/18 [id number]  Referring provider: Guadalupe Maple, MD [address] [place], [place] [address]  Chief Complaint  Patient presents with  . Wound Check    1 week follow up    HPI: 66 yo s/p scrotal I&D 8/20 in office and 8/23 in OR. He is POD#4 today. Wound cx was MRSA. He's has completed his antibiotics.  He states the pain is becoming less. He is still having drainage from the wound.  His wife has been successful and packing the wound.  He has not had fevers, chills, nausea or vomiting.  He has not had any scrotal swelling.    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    PMH: Past Medical History:  Diagnosis Date  . Arthritis   . Asthma   . Cancer [facility])    prostate  . Chronic venous insufficiency    varicose vein lower extremity with inflammation  . GERD (gastroesophageal reflux disease)    no issues since gastric bypass surgery as stated per pt  . Hyperlipidemia   . Hypogonadism in male   . MRSA (methicillin resistant Staphylococcus aureus) infection    07/30/2008 thru 08/07/2008  . Stented coronary artery   . Thrombocythemia [facility])     Surgical History: Past Surgical History:  Procedure Laterality Date  . ANGIOPLASTY    . ANGIOPLASTY     with stent 04/07/1995  . APPENDECTOMY     1966  . CARDIOVASCULAR STRESS TEST     07/31/2011  . CARPAL TUNNEL RELEASE Left   . CHOLECYSTECTOMY     2006  . colonscopy      08/25/2012  . FUNCTIONAL ENDOSCOPIC SINUS SURGERY     11/10/2013  . GASTRIC BYPASS     10/05/2012  . HERNIA REPAIR     left inguinal 1981  . INCISION AND DRAINAGE ABSCESS Right 02/26/2017   Procedure: INCISION AND DRAINAGE ABSCESS;  Surgeon: Nickie Retort, MD;  Location: [facility] ORS;  Service: Urology;  Laterality: Right;  . JOINT REPLACEMENT     bilateral  . left ankle surgery      05/03/2003   . left carpel tunnel      09/18/1993  . left knee meniscal tear      01/25/2010  . left knee meniscal tear repair      05/04/1996  . left rotator cuff repair      05/03/2003   . REPLACEMENT TOTAL KNEE BILATERAL  07/13/2015  . right ankle surgery      fracture has 2 screws 07/07/1997  . right carpel tunnel      05/16/1992  . right shoulder replacement      01/27/2006  . SCROTAL EXPLORATION Right 02/26/2017   Procedure: SCROTUM EXPLORATION;  Surgeon: Nickie Retort, MD;  Location: [facility] ORS;  Service: Urology;  Laterality: Right;  . TOTAL KNEE ARTHROPLASTY Left 07/13/2015   Procedure: LEFT TOTAL KNEE ARTHROPLASTY;  Surgeon: Gaynelle Arabian, MD;  Location: [facility]S;  Service: Orthopedics;  Laterality: Left;    Home Medications:  Allergies as of 03/10/2017      Reactions   Sulfamethoxazole-trimethoprim    REACTION: Drug rash   Tetracyclines & Related Rash      Medication List       Accurate as of 03/10/17 10:20 AM. Always use your most recent med list.          cetirizine 10 MG  tablet Commonly known as:  ZYRTEC Take 10 mg by mouth daily.   ciprofloxacin 500 MG tablet Commonly known as:  CIPRO Take 1 tablet (500 mg total) by mouth every 12 (twelve) hours.   clindamycin 300 MG capsule Commonly known as:  CLEOCIN Take 1 capsule (300 mg total) by mouth 3 (three) times daily.   glucose blood test strip 1 each by Other route 2 (two) times daily. DX E11.9   HYDROcodone-acetaminophen 5-325 MG tablet Commonly known as:  NORCO Take 1 tablet by mouth every 6 (six) hours as needed for moderate pain.   lisinopril 10 MG tablet Commonly known as:  PRINIVIL,ZESTRIL Take 1 tablet (10 mg total) by mouth daily.   Magnesium 250 MG Tabs Take by mouth.   metFORMIN 500 MG tablet Commonly known as:  GLUCOPHAGE Take 1 tablet (500 mg total) by mouth 2 (two) times daily with a meal.   MULTIVITAMIN PO Take by mouth.   onetouch ultrasoft lancets 1 each by Other route 2 (two) times daily. Dx E11.9   traMADol 50 MG tablet Commonly known as:  ULTRAM Take 1  tablet (50 mg total) by mouth every 6 (six) hours as needed.   valACYclovir 500 MG tablet Commonly known as:  VALTREX Take 1 tablet (500 mg total) by mouth daily.   Vitamin D3 5000 units Caps Take by mouth.       Allergies:  Allergies  Allergen Reactions  . Sulfamethoxazole-Trimethoprim     REACTION: Drug rash  . Tetracyclines & Related Rash    Family History: Family History  Problem Relation Age of Onset  . Cancer Mother        pancreatic  . Diabetes Mother   . Stroke Mother   . Heart disease Mother   . Hyperlipidemia Mother   . Hypertension Mother   . Heart attack Mother   . Heart disease Father   . Stroke Father   . Diabetes Father   . Hypertension Father   . Heart attack Father   . Hyperlipidemia Father   . Pancreatic cancer Father   . Cancer Sister   . Cancer Brother        lung  . Cancer Brother   . Kidney cancer Neg Hx   . Bladder Cancer Neg Hx   . Prostate cancer Neg Hx     Social History:  reports that he quit smoking about 32 years ago. His smoking use included Cigarettes. He has a 10.00 pack-year smoking history. He has never used smokeless tobacco. He reports that he does not drink alcohol or use drugs.  ROS: UROLOGY Frequent Urination?: No Hard to postpone urination?: No Burning/pain with urination?: No Get up at night to urinate?: No Leakage of urine?: No Urine stream starts and stops?: No Trouble starting stream?: No Do you have to strain to urinate?: No Blood in urine?: No Urinary tract infection?: No Sexually transmitted disease?: No Injury to kidneys or bladder?: No Painful intercourse?: No Weak stream?: No Erection problems?: No Penile pain?: No  Gastrointestinal Nausea?: No Vomiting?: No Indigestion/heartburn?: No Diarrhea?: No Constipation?: No  Constitutional Fever: No Night sweats?: No Weight loss?: No Fatigue?: No  Skin Skin rash/lesions?: No Itching?: No  Eyes Blurred vision?: No Double vision?:  No  Ears/Nose/Throat Sore throat?: No Sinus problems?: No  Hematologic/Lymphatic Swollen glands?: No Easy bruising?: No  Cardiovascular Leg swelling?: No Chest pain?: No  Respiratory Cough?: No Shortness of breath?: No  Endocrine Excessive thirst?: No  Musculoskeletal Back pain?: No  Joint pain?: Yes  Neurological Headaches?: No Dizziness?: No  Psychologic Depression?: No Anxiety?: No  Physical Exam: BP 124/79   Pulse 61   Ht 5' 10.5" (1.791 m)   Wt 207 lb 4.8 oz (94 kg)   BMI 29.32 kg/m   Constitutional:  Alert and oriented, No acute distress. HEENT: [place] AT, moist mucus membranes.  Trachea midline, no masses. Cardiovascular: No clubbing, cyanosis, or edema. Respiratory: Normal respiratory effort, no increased work of breathing. GI: Abdomen is soft, nontender, nondistended, no abdominal masses GU: No CVA tenderness.  Scrotum: skin looks healthy with no erythema or induration or fluctuance. Packing removed and wound looks healthy, beefy red granulation. Skin: No rashes, bruises or suspicious lesions. Lymph: No cervical or inguinal adenopathy. Neurologic: Grossly intact, no focal deficits, moving all 4 extremities. Psychiatric: Normal mood and affect.  Laboratory Data: Lab Results  Component Value Date   WBC 5.4 02/26/2017   HGB 13.8 02/26/2017   HCT 40.8 02/26/2017   MCV 91.6 02/26/2017   PLT 174 02/26/2017    Lab Results  Component Value Date   CREATININE 1.16 02/26/2017   Results for orders placed or performed during the hospital encounter of 58/09/98  Basic metabolic panel  Result Value Ref [place]   Sodium 136 135 - 145 mmol/L   Potassium 4.6 3.5 - 5.1 mmol/L   Chloride 102 101 - 111 mmol/L   CO2 28 22 - 32 mmol/L   Glucose, Bld 168 (H) 65 - 99 mg/dL   BUN 27 (H) 6 - 20 mg/dL   Creatinine, Ser 1.16 0.61 - 1.24 mg/dL   Calcium 9.3 8.9 - 10.3 mg/dL   GFR calc non Af Amer >60 >60 mL/min   GFR calc Af Amer >60 >60 mL/min   Anion gap 6 5 - 15   CBC  Result Value Ref [place]   WBC 5.4 3.8 - 10.6 K/uL   RBC 4.45 4.40 - 5.90 MIL/uL   Hemoglobin 13.8 13.0 - 18.0 g/dL   HCT 40.8 40.0 - 52.0 %   MCV 91.6 80.0 - 100.0 fL   MCH 30.9 26.0 - 34.0 pg   MCHC 33.8 32.0 - 36.0 g/dL   RDW 13.0 11.5 - 14.5 %   Platelets 174 150 - 440 K/uL  Glucose, capillary  Result Value Ref [place]   Glucose-Capillary 150 (H) 65 - 99 mg/dL  Glucose, capillary  Result Value Ref [place]   Glucose-Capillary 111 (H) 65 - 99 mg/dL   I have reviewed the labs  Procedure Wound is cleansed with Betadine.  It is packed with iodoform dressing   Assessment & Plan:    Scrotal abscess - healing well. Wife to change packing daily. Pt can shower. See in 1 week. Complete Cipro. He doesn't need further abx.   PCa - f/u in December as planned for PSA.     Return for Thursday or Friday for a wound check.  Zara Council, PA-C  [facility] [address] [place], [place] [address] [phone]

## 2017-03-10 ENCOUNTER — Encounter: Payer: Self-pay | Admitting: Urology

## 2017-03-10 ENCOUNTER — Ambulatory Visit (INDEPENDENT_AMBULATORY_CARE_PROVIDER_SITE_OTHER): Payer: PPO | Admitting: Urology

## 2017-03-10 VITALS — BP 124/79 | HR 61 | Ht 70.5 in | Wt 207.3 lb

## 2017-03-10 DIAGNOSIS — N492 Inflammatory disorders of scrotum: Secondary | ICD-10-CM

## 2017-03-11 NOTE — Progress Notes (Signed)
03/12/2017 2:01 PM   Andrew Cobb [birth year] [id number]  Referring provider: Guadalupe Maple, MD [address] [place], [place] [address]  Chief Complaint  Patient presents with  . Follow-up    Wound check    HPI: 66 yo s/p scrotal I&D 8/20 in office and 8/23 in OR. He is POD#4 today. Wound cx was MRSA. He's has completed his antibiotics.  He states the pain is becoming less. He is still having drainage from the wound.  His wife has been successful and packing the wound.  He has not had fevers, chills, nausea or vomiting.  He has not had any scrotal swelling.    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    PMH: Past Medical History:  Diagnosis Date  . Arthritis   . Asthma   . Cancer [facility])    prostate  . Chronic venous insufficiency    varicose vein lower extremity with inflammation  . GERD (gastroesophageal reflux disease)    no issues since gastric bypass surgery as stated per pt  . Hyperlipidemia   . Hypogonadism in male   . MRSA (methicillin resistant Staphylococcus aureus) infection    07/30/2008 thru 08/07/2008  . Stented coronary artery   . Thrombocythemia [facility])     Surgical History: Past Surgical History:  Procedure Laterality Date  . ANGIOPLASTY    . ANGIOPLASTY     with stent 04/07/1995  . APPENDECTOMY     1966  . CARDIOVASCULAR STRESS TEST     07/31/2011  . CARPAL TUNNEL RELEASE Left   . CHOLECYSTECTOMY     2006  . colonscopy      08/25/2012  . FUNCTIONAL ENDOSCOPIC SINUS SURGERY     11/10/2013  . GASTRIC BYPASS     10/05/2012  . HERNIA REPAIR     left inguinal 1981  . INCISION AND DRAINAGE ABSCESS Right 02/26/2017   Procedure: INCISION AND DRAINAGE ABSCESS;  Surgeon: Nickie Retort, MD;  Location: [facility] ORS;  Service: Urology;  Laterality: Right;  . JOINT REPLACEMENT     bilateral  . left ankle surgery      05/03/2003   . left carpel tunnel      09/18/1993  . left knee meniscal tear      01/25/2010  .  left knee meniscal tear repair      05/04/1996  . left rotator cuff repair      05/03/2003   . REPLACEMENT TOTAL KNEE BILATERAL  07/13/2015  . right ankle surgery      fracture has 2 screws 07/07/1997  . right carpel tunnel      05/16/1992  . right shoulder replacement      01/27/2006  . SCROTAL EXPLORATION Right 02/26/2017   Procedure: SCROTUM EXPLORATION;  Surgeon: Nickie Retort, MD;  Location: [facility] ORS;  Service: Urology;  Laterality: Right;  . TOTAL KNEE ARTHROPLASTY Left 07/13/2015   Procedure: LEFT TOTAL KNEE ARTHROPLASTY;  Surgeon: Gaynelle Arabian, MD;  Location: [facility];  Service: Orthopedics;  Laterality: Left;    Home Medications:  Allergies as of 03/12/2017      Reactions   Sulfamethoxazole-trimethoprim    REACTION: Drug rash   Tetracyclines & Related Rash      Medication List       Accurate as of 03/12/17  2:01 PM. Always use your most recent med list.          cetirizine 10 MG tablet  Commonly known as:  ZYRTEC Take 10 mg by mouth daily.   ciprofloxacin 500 MG tablet Commonly known as:  CIPRO Take 1 tablet (500 mg total) by mouth every 12 (twelve) hours.   clindamycin 300 MG capsule Commonly known as:  CLEOCIN Take 1 capsule (300 mg total) by mouth 3 (three) times daily.   glucose blood test strip 1 each by Other route 2 (two) times daily. DX E11.9   HYDROcodone-acetaminophen 5-325 MG tablet Commonly known as:  NORCO Take 1 tablet by mouth every 6 (six) hours as needed for moderate pain.   lisinopril 10 MG tablet Commonly known as:  PRINIVIL,ZESTRIL Take 1 tablet (10 mg total) by mouth daily.   Magnesium 250 MG Tabs Take by mouth.   metFORMIN 500 MG tablet Commonly known as:  GLUCOPHAGE Take 1 tablet (500 mg total) by mouth 2 (two) times daily with a meal.   MULTIVITAMIN PO Take by mouth.   onetouch ultrasoft lancets 1 each by Other route 2 (two) times daily. Dx E11.9   traMADol 50 MG tablet Commonly known as:  ULTRAM Take 1 tablet (50 mg  total) by mouth every 6 (six) hours as needed.   valACYclovir 500 MG tablet Commonly known as:  VALTREX Take 1 tablet (500 mg total) by mouth daily.   Vitamin D3 5000 units Caps Take by mouth.       Allergies:  Allergies  Allergen Reactions  . Sulfamethoxazole-Trimethoprim     REACTION: Drug rash  . Tetracyclines & Related Rash    Family History: Family History  Problem Relation Age of Onset  . Cancer Mother        pancreatic  . Diabetes Mother   . Stroke Mother   . Heart disease Mother   . Hyperlipidemia Mother   . Hypertension Mother   . Heart attack Mother   . Heart disease Father   . Stroke Father   . Diabetes Father   . Hypertension Father   . Heart attack Father   . Hyperlipidemia Father   . Pancreatic cancer Father   . Cancer Sister   . Cancer Brother        lung  . Cancer Brother   . Kidney cancer Neg Hx   . Bladder Cancer Neg Hx   . Prostate cancer Neg Hx     Social History:  reports that he quit smoking about 32 years ago. His smoking use included Cigarettes. He has a 10.00 pack-year smoking history. He has never used smokeless tobacco. He reports that he does not drink alcohol or use drugs.  ROS: UROLOGY Frequent Urination?: No Hard to postpone urination?: No Burning/pain with urination?: No Get up at night to urinate?: No Leakage of urine?: No Urine stream starts and stops?: No Trouble starting stream?: No Do you have to strain to urinate?: No Blood in urine?: No Urinary tract infection?: No Sexually transmitted disease?: No Injury to kidneys or bladder?: No Painful intercourse?: No Weak stream?: No Erection problems?: No Penile pain?: No  Gastrointestinal Nausea?: No Vomiting?: No Indigestion/heartburn?: No Diarrhea?: No Constipation?: No  Constitutional Fever: No Night sweats?: No Weight loss?: No Fatigue?: No  Skin Skin rash/lesions?: No Itching?: No  Eyes Blurred vision?: No Double vision?:  No  Ears/Nose/Throat Sore throat?: No Sinus problems?: No  Hematologic/Lymphatic Swollen glands?: No Easy bruising?: No  Cardiovascular Leg swelling?: No Chest pain?: No  Respiratory Cough?: No Shortness of breath?: No  Endocrine Excessive thirst?: No  Musculoskeletal Back pain?: No Joint  pain?: Yes  Neurological Headaches?: No Dizziness?: No  Psychologic Depression?: No Anxiety?: No  Physical Exam: BP 135/80   Pulse (!) 52   Ht 5' 10.5" (1.791 m)   Wt 207 lb 8 oz (94.1 kg)   BMI 29.35 kg/m   Constitutional:  Alert and oriented, No acute distress. HEENT: [place] AT, moist mucus membranes.  Trachea midline, no masses. Cardiovascular: No clubbing, cyanosis, or edema. Respiratory: Normal respiratory effort, no increased work of breathing. GI: Abdomen is soft, nontender, nondistended, no abdominal masses GU: No CVA tenderness.  Scrotum: skin looks healthy with no erythema or induration or fluctuance. Packing removed and wound looks healthy, beefy red granulation. Skin: No rashes, bruises or suspicious lesions. Lymph: No cervical or inguinal adenopathy. Neurologic: Grossly intact, no focal deficits, moving all 4 extremities. Psychiatric: Normal mood and affect.  Laboratory Data: Lab Results  Component Value Date   WBC 5.4 02/26/2017   HGB 13.8 02/26/2017   HCT 40.8 02/26/2017   MCV 91.6 02/26/2017   PLT 174 02/26/2017    Lab Results  Component Value Date   CREATININE 1.16 02/26/2017   Results for orders placed or performed during the hospital encounter of 96/78/93  Basic metabolic panel  Result Value Ref [place]   Sodium 136 135 - 145 mmol/L   Potassium 4.6 3.5 - 5.1 mmol/L   Chloride 102 101 - 111 mmol/L   CO2 28 22 - 32 mmol/L   Glucose, Bld 168 (H) 65 - 99 mg/dL   BUN 27 (H) 6 - 20 mg/dL   Creatinine, Ser 1.16 0.61 - 1.24 mg/dL   Calcium 9.3 8.9 - 10.3 mg/dL   GFR calc non Af Amer >60 >60 mL/min   GFR calc Af Amer >60 >60 mL/min   Anion gap 6 5 -  15  CBC  Result Value Ref [place]   WBC 5.4 3.8 - 10.6 K/uL   RBC 4.45 4.40 - 5.90 MIL/uL   Hemoglobin 13.8 13.0 - 18.0 g/dL   HCT 40.8 40.0 - 52.0 %   MCV 91.6 80.0 - 100.0 fL   MCH 30.9 26.0 - 34.0 pg   MCHC 33.8 32.0 - 36.0 g/dL   RDW 13.0 11.5 - 14.5 %   Platelets 174 150 - 440 K/uL  Glucose, capillary  Result Value Ref [place]   Glucose-Capillary 150 (H) 65 - 99 mg/dL  Glucose, capillary  Result Value Ref [place]   Glucose-Capillary 111 (H) 65 - 99 mg/dL   I have reviewed the labs  Procedure Wound is cleansed with Betadine.  Explored with a sterile Q-tip.  Depth of 6 mm.  It is packed with iodoform dressing   Assessment & Plan:    Scrotal abscess - healing well. Wife to change packing daily. Pt can shower. See in 1 week. Completed Cipro. He doesn't need further abx.   PCa - f/u in December as planned for PSA.     Return in about 1 week (around 03/19/2017) for wound recheck.  Zara Council, PA-C  [facility] [address], [place], [place] [address] [phone]

## 2017-03-12 ENCOUNTER — Ambulatory Visit (INDEPENDENT_AMBULATORY_CARE_PROVIDER_SITE_OTHER): Payer: PPO | Admitting: Urology

## 2017-03-12 ENCOUNTER — Encounter: Payer: Self-pay | Admitting: Urology

## 2017-03-12 VITALS — BP 135/80 | HR 52 | Ht 70.5 in | Wt 207.5 lb

## 2017-03-12 DIAGNOSIS — N492 Inflammatory disorders of scrotum: Secondary | ICD-10-CM | POA: Diagnosis not present

## 2017-03-13 ENCOUNTER — Telehealth: Payer: Self-pay | Admitting: Family Medicine

## 2017-03-13 NOTE — Telephone Encounter (Signed)
Called pt to schedule for Annual Wellness Visit with Nurse Health Advisor, Tiffany Hill, my c/b # is [phone]  Kathryn Brown ° °

## 2017-03-18 NOTE — Progress Notes (Signed)
03/19/2017 4:12 PM   Andrew Cobb 10-05-1950 [id number]  Referring provider: Guadalupe Maple, MD [address] [place], [place] [address]  Chief Complaint  Patient presents with  . Wound Check    HPI: 66 yo s/p scrotal I&D 8/20 in office and 8/23 in OR. He is POD#4 today. Wound cx was MRSA. He's has completed his antibiotics.  He states the pain is no more. He is Not having drainage from the wound.  He has not had fevers, chills, nausea or vomiting.  He has not had any scrotal swelling.    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    PMH: Past Medical History:  Diagnosis Date  . Arthritis   . Asthma   . Cancer [facility])    prostate  . Chronic venous insufficiency    varicose vein lower extremity with inflammation  . GERD (gastroesophageal reflux disease)    no issues since gastric bypass surgery as stated per pt  . Hyperlipidemia   . Hypogonadism in male   . MRSA (methicillin resistant Staphylococcus aureus) infection    07/30/2008 thru 08/07/2008  . Stented coronary artery   . Thrombocythemia [facility])     Surgical History: Past Surgical History:  Procedure Laterality Date  . ANGIOPLASTY    . ANGIOPLASTY     with stent 04/07/1995  . APPENDECTOMY     1966  . CARDIOVASCULAR STRESS TEST     07/31/2011  . CARPAL TUNNEL RELEASE Left   . CHOLECYSTECTOMY     2006  . colonscopy      08/25/2012  . FUNCTIONAL ENDOSCOPIC SINUS SURGERY     11/10/2013  . GASTRIC BYPASS     10/05/2012  . HERNIA REPAIR     left inguinal 1981  . INCISION AND DRAINAGE ABSCESS Right 02/26/2017   Procedure: INCISION AND DRAINAGE ABSCESS;  Surgeon: Nickie Retort, MD;  Location: [facility] ORS;  Service: Urology;  Laterality: Right;  . JOINT REPLACEMENT     bilateral  . left ankle surgery      05/03/2003   . left carpel tunnel      09/18/1993  . left knee meniscal tear      01/25/2010  . left knee meniscal tear repair      05/04/1996  . left rotator cuff repair       05/03/2003   . REPLACEMENT TOTAL KNEE BILATERAL  07/13/2015  . right ankle surgery      fracture has 2 screws 07/07/1997  . right carpel tunnel      05/16/1992  . right shoulder replacement      01/27/2006  . SCROTAL EXPLORATION Right 02/26/2017   Procedure: SCROTUM EXPLORATION;  Surgeon: Nickie Retort, MD;  Location: [facility] ORS;  Service: Urology;  Laterality: Right;  . TOTAL KNEE ARTHROPLASTY Left 07/13/2015   Procedure: LEFT TOTAL KNEE ARTHROPLASTY;  Surgeon: Gaynelle Arabian, MD;  Location: [facility] ORS;  Service: Orthopedics;  Laterality: Left;    Home Medications:  Allergies as of 03/19/2017      Reactions   Sulfamethoxazole-trimethoprim    REACTION: Drug rash   Tetracyclines & Related Rash      Medication List       Accurate as of 03/19/17  4:12 PM. Always use your most recent med list.          cetirizine 10 MG tablet Commonly known as:  ZYRTEC Take 10 mg by mouth daily.   glucose  blood test strip 1 each by Other route 2 (two) times daily. DX E11.9   lisinopril 10 MG tablet Commonly known as:  PRINIVIL,ZESTRIL Take 1 tablet (10 mg total) by mouth daily.   Magnesium 250 MG Tabs Take by mouth.   metFORMIN 500 MG tablet Commonly known as:  GLUCOPHAGE Take 1 tablet (500 mg total) by mouth 2 (two) times daily with a meal.   MULTIVITAMIN PO Take by mouth.   onetouch ultrasoft lancets 1 each by Other route 2 (two) times daily. Dx E11.9   valACYclovir 500 MG tablet Commonly known as:  VALTREX Take 1 tablet (500 mg total) by mouth daily.   Vitamin D3 5000 units Caps Take by mouth.       Allergies:  Allergies  Allergen Reactions  . Sulfamethoxazole-Trimethoprim     REACTION: Drug rash  . Tetracyclines & Related Rash    Family History: Family History  Problem Relation Age of Onset  . Cancer Mother        pancreatic  . Diabetes Mother   . Stroke Mother   . Heart disease Mother   . Hyperlipidemia Mother   . Hypertension Mother   . Heart attack  Mother   . Heart disease Father   . Stroke Father   . Diabetes Father   . Hypertension Father   . Heart attack Father   . Hyperlipidemia Father   . Pancreatic cancer Father   . Cancer Sister   . Cancer Brother        lung  . Cancer Brother   . Kidney cancer Neg Hx   . Bladder Cancer Neg Hx   . Prostate cancer Neg Hx     Social History:  reports that he quit smoking about 32 years ago. His smoking use included Cigarettes. He has a 10.00 pack-year smoking history. He has never used smokeless tobacco. He reports that he does not drink alcohol or use drugs.  ROS: UROLOGY Frequent Urination?: No Hard to postpone urination?: No Burning/pain with urination?: No Get up at night to urinate?: No Leakage of urine?: No Urine stream starts and stops?: No Trouble starting stream?: No Do you have to strain to urinate?: No Blood in urine?: No Urinary tract infection?: No Sexually transmitted disease?: No Injury to kidneys or bladder?: No Painful intercourse?: No Weak stream?: No Erection problems?: No Penile pain?: No  Gastrointestinal Nausea?: No Vomiting?: No Indigestion/heartburn?: No Diarrhea?: No Constipation?: No  Constitutional Fever: No Night sweats?: No Weight loss?: No Fatigue?: No  Skin Skin rash/lesions?: No Itching?: No  Eyes Blurred vision?: No Double vision?: No  Ears/Nose/Throat Sore throat?: No Sinus problems?: No  Hematologic/Lymphatic Swollen glands?: No Easy bruising?: No  Cardiovascular Leg swelling?: No Chest pain?: No  Respiratory Cough?: No Shortness of breath?: No  Endocrine Excessive thirst?: No  Musculoskeletal Back pain?: No Joint pain?: Yes  Neurological Headaches?: No Dizziness?: No  Psychologic Depression?: No Anxiety?: No  Physical Exam: BP 114/61 (BP Location: Left Arm, Patient Position: Sitting, Cuff Size: Normal)   Pulse (!) 53   Ht 5' 10.5" (1.791 m)   Wt 210 lb 12.8 oz (95.6 kg)   BMI 29.82 kg/m     Constitutional:  Alert and oriented, No acute distress. HEENT: [place] AT, moist mucus membranes.  Trachea midline, no masses. Cardiovascular: No clubbing, cyanosis, or edema. Respiratory: Normal respiratory effort, no increased work of breathing. GI: Abdomen is soft, nontender, nondistended, no abdominal masses GU: No CVA tenderness.  Scrotum: skin looks healthy  with no erythema or induration or fluctuance.  Wound looks healthy, beefy red granulation.  Shallow Skin: No rashes, bruises or suspicious lesions. Lymph: No cervical or inguinal adenopathy. Neurologic: Grossly intact, no focal deficits, moving all 4 extremities. Psychiatric: Normal mood and affect.  Laboratory Data: Lab Results  Component Value Date   WBC 5.4 02/26/2017   HGB 13.8 02/26/2017   HCT 40.8 02/26/2017   MCV 91.6 02/26/2017   PLT 174 02/26/2017    Lab Results  Component Value Date   CREATININE 1.16 02/26/2017   Results for orders placed or performed during the hospital encounter of 00/92/33  Basic metabolic panel  Result Value Ref Range   Sodium 136 135 - 145 mmol/L   Potassium 4.6 3.5 - 5.1 mmol/L   Chloride 102 101 - 111 mmol/L   CO2 28 22 - 32 mmol/L   Glucose, Bld 168 (H) 65 - 99 mg/dL   BUN 27 (H) 6 - 20 mg/dL   Creatinine, Ser 1.16 0.61 - 1.24 mg/dL   Calcium 9.3 8.9 - 10.3 mg/dL   GFR calc non Af Amer >60 >60 mL/min   GFR calc Af Amer >60 >60 mL/min   Anion gap 6 5 - 15  CBC  Result Value Ref Range   WBC 5.4 3.8 - 10.6 K/uL   RBC 4.45 4.40 - 5.90 MIL/uL   Hemoglobin 13.8 13.0 - 18.0 g/dL   HCT 40.8 40.0 - 52.0 %   MCV 91.6 80.0 - 100.0 fL   MCH 30.9 26.0 - 34.0 pg   MCHC 33.8 32.0 - 36.0 g/dL   RDW 13.0 11.5 - 14.5 %   Platelets 174 150 - 440 K/uL  Glucose, capillary  Result Value Ref Range   Glucose-Capillary 150 (H) 65 - 99 mg/dL  Glucose, capillary  Result Value Ref Range   Glucose-Capillary 111 (H) 65 - 99 mg/dL   I have reviewed the labs   Assessment & Plan:    Scrotal  abscess - healing well. No need for continue packing. Pt can shower.  Call if problems.    PCa - f/u in December as planned for PSA.     Return for december.  Zara Council, PA-C  [facility] [address] [place], [place] [address] [phone]

## 2017-03-19 ENCOUNTER — Ambulatory Visit (INDEPENDENT_AMBULATORY_CARE_PROVIDER_SITE_OTHER): Payer: PPO | Admitting: Urology

## 2017-03-19 ENCOUNTER — Encounter: Payer: Self-pay | Admitting: Urology

## 2017-03-19 VITALS — BP 114/61 | HR 53 | Ht 70.5 in | Wt 210.8 lb

## 2017-03-19 DIAGNOSIS — C61 Malignant neoplasm of prostate: Secondary | ICD-10-CM | POA: Diagnosis not present

## 2017-03-19 DIAGNOSIS — N492 Inflammatory disorders of scrotum: Secondary | ICD-10-CM

## 2017-04-09 NOTE — Telephone Encounter (Signed)
Called to scheduled Medicare Annual Wellness visit at [facility]. Patient states he will call back tomorrow afternoon 04/10/2017 as he is driving currently.

## 2017-04-20 ENCOUNTER — Ambulatory Visit: Payer: PPO | Admitting: Family Medicine

## 2017-04-20 ENCOUNTER — Ambulatory Visit (INDEPENDENT_AMBULATORY_CARE_PROVIDER_SITE_OTHER): Payer: PPO

## 2017-04-20 VITALS — BP 156/85 | HR 53 | Temp 97.9°F | Resp 17 | Ht 71.0 in | Wt 212.6 lb

## 2017-04-20 DIAGNOSIS — Z0000 Encounter for general adult medical examination without abnormal findings: Secondary | ICD-10-CM | POA: Diagnosis not present

## 2017-04-20 NOTE — Patient Instructions (Signed)
[name] , Thank you for taking time to come for your Medicare Wellness Visit. I appreciate your ongoing commitment to your health goals. Please review the following plan we discussed and let me know if I can assist you in the future.   Screening recommendations/referrals: Colonoscopy: completed 08/2012 Recommended yearly ophthalmology/optometry visit for glaucoma screening and checkup Recommended yearly dental visit for hygiene and checkup  Vaccinations: Influenza vaccine: up to date Pneumococcal vaccine: up to date Tdap vaccine: up to date Shingles vaccine:up to date   Advanced directives: Please bring a copy of your health care power of attorney and living will to the office at your convenience.  Conditions/risks identified: none  Next appointment: Follow up with Dr.Crissman on 07/21/2017 at 8:30am. Follow up in one year for your annual wellness exam.  Preventive Care 65 Years and Older, Male Preventive care refers to lifestyle choices and visits with your health care provider that can promote health and wellness. What does preventive care include?  A yearly physical exam. This is also called an annual well check.  Dental exams once or twice a year.  Routine eye exams. Ask your health care provider how often you should have your eyes checked.  Personal lifestyle choices, including:  Daily care of your teeth and gums.  Regular physical activity.  Eating a healthy diet.  Avoiding tobacco and drug use.  Limiting alcohol use.  Practicing safe sex.  Taking low doses of aspirin every day.  Taking vitamin and mineral supplements as recommended by your health care provider. What happens during an annual well check? The services and screenings done by your health care provider during your annual well check will depend on your age, overall health, lifestyle risk factors, and family history of disease. Counseling  Your health care provider may ask you questions about  your:  Alcohol use.  Tobacco use.  Drug use.  Emotional well-being.  Home and relationship well-being.  Sexual activity.  Eating habits.  History of falls.  Memory and ability to understand (cognition).  Work and work Statistician. Screening  You may have the following tests or measurements:  Height, weight, and BMI.  Blood pressure.  Lipid and cholesterol levels. These may be checked every 5 years, or more frequently if you are over 62 years old.  Skin check.  Lung cancer screening. You may have this screening every year starting at age 43 if you have a 30-pack-year history of smoking and currently smoke or have quit within the past 15 years.  Fecal occult blood test (FOBT) of the stool. You may have this test every year starting at age 10.  Flexible sigmoidoscopy or colonoscopy. You may have a sigmoidoscopy every 5 years or a colonoscopy every 10 years starting at age 30.  Prostate cancer screening. Recommendations will vary depending on your family history and other risks.  Hepatitis C blood test.  Hepatitis B blood test.  Sexually transmitted disease (STD) testing.  Diabetes screening. This is done by checking your blood sugar (glucose) after you have not eaten for a while (fasting). You may have this done every 1-3 years.  Abdominal aortic aneurysm (AAA) screening. You may need this if you are a current or former smoker.  Osteoporosis. You may be screened starting at age 65 if you are at high risk. Talk with your health care provider about your test results, treatment options, and if necessary, the need for more tests. Vaccines  Your health care provider may recommend certain vaccines, such as:  Influenza vaccine. This is recommended every year.  Tetanus, diphtheria, and acellular pertussis (Tdap, Td) vaccine. You may need a Td booster every 10 years.  Zoster vaccine. You may need this after age 24.  Pneumococcal 13-valent conjugate (PCV13) vaccine.  One dose is recommended after age 9.  Pneumococcal polysaccharide (PPSV23) vaccine. One dose is recommended after age 72. Talk to your health care provider about which screenings and vaccines you need and how often you need them. This information is not intended to replace advice given to you by your health care provider. Make sure you discuss any questions you have with your health care provider. Document Released: 07/20/2015 Document Revised: 03/12/2016 Document Reviewed: 04/24/2015 Elsevier Interactive Patient Education  2017 [place] Prevention in the Home Falls can cause injuries. They can happen to people of all ages. There are many things you can do to make your home safe and to help prevent falls. What can I do on the outside of my home?  Regularly fix the edges of walkways and driveways and fix any cracks.  Remove anything that might make you trip as you walk through a door, such as a raised step or threshold.  Trim any bushes or trees on the path to your home.  Use bright outdoor lighting.  Clear any walking paths of anything that might make someone trip, such as rocks or tools.  Regularly check to see if handrails are loose or broken. Make sure that both sides of any steps have handrails.  Any raised decks and porches should have guardrails on the edges.  Have any leaves, snow, or ice cleared regularly.  Use sand or salt on walking paths during winter.  Clean up any spills in your garage right away. This includes oil or grease spills. What can I do in the bathroom?  Use night lights.  Install grab bars by the toilet and in the tub and shower. Do not use towel bars as grab bars.  Use non-skid mats or decals in the tub or shower.  If you need to sit down in the shower, use a plastic, non-slip stool.  Keep the floor dry. Clean up any water that spills on the floor as soon as it happens.  Remove soap buildup in the tub or shower regularly.  Attach bath  mats securely with double-sided non-slip rug tape.  Do not have throw rugs and other things on the floor that can make you trip. What can I do in the bedroom?  Use night lights.  Make sure that you have a light by your bed that is easy to reach.  Do not use any sheets or blankets that are too big for your bed. They should not hang down onto the floor.  Have a firm chair that has side arms. You can use this for support while you get dressed.  Do not have throw rugs and other things on the floor that can make you trip. What can I do in the kitchen?  Clean up any spills right away.  Avoid walking on wet floors.  Keep items that you use a lot in easy-to-reach places.  If you need to reach something above you, use a strong step stool that has a grab bar.  Keep electrical cords out of the way.  Do not use floor polish or wax that makes floors slippery. If you must use wax, use non-skid floor wax.  Do not have throw rugs and other things on the floor that  can make you trip. What can I do with my stairs?  Do not leave any items on the stairs.  Make sure that there are handrails on both sides of the stairs and use them. Fix handrails that are broken or loose. Make sure that handrails are as long as the stairways.  Check any carpeting to make sure that it is firmly attached to the stairs. Fix any carpet that is loose or worn.  Avoid having throw rugs at the top or bottom of the stairs. If you do have throw rugs, attach them to the floor with carpet tape.  Make sure that you have a light switch at the top of the stairs and the bottom of the stairs. If you do not have them, ask someone to add them for you. What else can I do to help prevent falls?  Wear shoes that:  Do not have high heels.  Have rubber bottoms.  Are comfortable and fit you well.  Are closed at the toe. Do not wear sandals.  If you use a stepladder:  Make sure that it is fully opened. Do not climb a closed  stepladder.  Make sure that both sides of the stepladder are locked into place.  Ask someone to hold it for you, if possible.  Clearly mark and make sure that you can see:  Any grab bars or handrails.  First and last steps.  Where the edge of each step is.  Use tools that help you move around (mobility aids) if they are needed. These include:  Canes.  Walkers.  Scooters.  Crutches.  Turn on the lights when you go into a dark area. Replace any light bulbs as soon as they burn out.  Set up your furniture so you have a clear path. Avoid moving your furniture around.  If any of your floors are uneven, fix them.  If there are any pets around you, be aware of where they are.  Review your medicines with your doctor. Some medicines can make you feel dizzy. This can increase your chance of falling. Ask your doctor what other things that you can do to help prevent falls. This information is not intended to replace advice given to you by your health care provider. Make sure you discuss any questions you have with your health care provider. Document Released: 04/19/2009 Document Revised: 11/29/2015 Document Reviewed: 07/28/2014 Elsevier Interactive Patient Education  2017 [employer].

## 2017-04-20 NOTE — Progress Notes (Signed)
Subjective:   Andrew Cobb is a 66 y.o. male who presents for Medicare Annual/Subsequent preventive examination.  Review of Systems:  Cardiac Risk Factors include: male gender;advanced age (>26men, >65 women);diabetes mellitus;hypertension;dyslipidemia     Objective:    Vitals: BP (!) 156/85 (BP Location: Right Arm, Cuff Size: Normal)   Pulse (!) 53   Temp 97.9 F (36.6 C) (Oral)   Resp 17   Ht 5\' 11"  (1.803 m)   Wt 212 lb 9.6 oz (96.4 kg)   BMI 29.65 kg/m   Body mass index is 29.65 kg/m.  Tobacco History  Smoking Status  . Former Smoker  . Packs/day: 1.00  . Years: 10.00  . Types: Cigarettes  . Quit date: 07/07/1984  Smokeless Tobacco  . Never Used    Comment: quit 1986     Counseling given: Not Answered   Past Medical History:  Diagnosis Date  . Arthritis   . Asthma   . Cancer [facility])    prostate  . Chronic venous insufficiency    varicose vein lower extremity with inflammation  . GERD (gastroesophageal reflux disease)    no issues since gastric bypass surgery as stated per pt  . Hyperlipidemia   . Hypogonadism in male   . MRSA (methicillin resistant Staphylococcus aureus) infection    07/30/2008 thru 08/07/2008  . Stented coronary artery   . Thrombocythemia [facility])    Past Surgical History:  Procedure Laterality Date  . ANGIOPLASTY    . ANGIOPLASTY     with stent 04/07/1995  . APPENDECTOMY     1966  . CARDIOVASCULAR STRESS TEST     07/31/2011  . CARPAL TUNNEL RELEASE Left   . CHOLECYSTECTOMY     2006  . colonscopy      08/25/2012  . FUNCTIONAL ENDOSCOPIC SINUS SURGERY     11/10/2013  . GASTRIC BYPASS     10/05/2012  . HERNIA REPAIR     left inguinal 1981  . INCISION AND DRAINAGE ABSCESS Right 02/26/2017   Procedure: INCISION AND DRAINAGE ABSCESS;  Surgeon: Nickie Retort, MD;  Location: [facility] ORS;  Service: Urology;  Laterality: Right;  . JOINT REPLACEMENT     bilateral  . left ankle surgery      05/03/2003   . left carpel tunnel      09/18/1993  . left knee meniscal tear      01/25/2010  . left knee meniscal tear repair      05/04/1996  . left rotator cuff repair      05/03/2003   . REPLACEMENT TOTAL KNEE BILATERAL  07/13/2015  . right ankle surgery      fracture has 2 screws 07/07/1997  . right carpel tunnel      05/16/1992  . right shoulder replacement      01/27/2006  . SCROTAL EXPLORATION Right 02/26/2017   Procedure: SCROTUM EXPLORATION;  Surgeon: Nickie Retort, MD;  Location: [facility] ORS;  Service: Urology;  Laterality: Right;  . TOTAL KNEE ARTHROPLASTY Left 07/13/2015   Procedure: LEFT TOTAL KNEE ARTHROPLASTY;  Surgeon: Gaynelle Arabian, MD;  Location: [facility];  Service: Orthopedics;  Laterality: Left;   Family History  Problem Relation Age of Onset  . Cancer Mother        pancreatic  . Diabetes Mother   . Stroke Mother   . Heart disease Mother   . Hyperlipidemia Mother   . Hypertension Mother   . Heart attack Mother   . Heart disease Father   .  Stroke Father   . Diabetes Father   . Hypertension Father   . Heart attack Father   . Hyperlipidemia Father   . Pancreatic cancer Father   . Cancer Sister   . Cancer Brother        lung  . Cancer Brother   . Kidney cancer Neg Hx   . Bladder Cancer Neg Hx   . Prostate cancer Neg Hx    History  Sexual Activity  . Sexual activity: Not on file    Outpatient Encounter Prescriptions as of 04/20/2017  Medication Sig  . cetirizine (ZYRTEC) 10 MG tablet Take 10 mg by mouth daily.  . Cholecalciferol (VITAMIN D3) 5000 units CAPS Take by mouth.  [name] Kitchen glucose blood test strip 1 each by Other route 2 (two) times daily. DX E11.9  . Lancets (ONETOUCH ULTRASOFT) lancets 1 each by Other route 2 (two) times daily. Dx E11.9  . lisinopril (PRINIVIL,ZESTRIL) 10 MG tablet Take 1 tablet (10 mg total) by mouth daily.  . Magnesium 250 MG TABS Take by mouth.  . metFORMIN (GLUCOPHAGE) 500 MG tablet Take 1 tablet (500 mg total) by mouth 2 (two) times daily with a meal. (Patient  taking differently: Take 500 mg by mouth daily with breakfast. Once daily)  . Multiple Vitamins-Minerals (MULTIVITAMIN PO) Take by mouth.  . valACYclovir (VALTREX) 500 MG tablet Take 1 tablet (500 mg total) by mouth daily.   No facility-administered encounter medications on file as of 04/20/2017.     Activities of Daily Living In your present state of health, do you have any difficulty performing the following activities: 04/20/2017 02/26/2017  Hearing? N N  Vision? N N  Difficulty concentrating or making decisions? N N  Walking or climbing stairs? N N  Dressing or bathing? N N  Doing errands, shopping? N -  Preparing Food and eating ? N -  Using the Toilet? N -  In the past six months, have you accidently leaked urine? N -  Do you have problems with loss of bowel control? N -  Managing your Medications? N -  Managing your Finances? N -  Housekeeping or managing your Housekeeping? N -  Some recent data might be hidden    Patient Care Team: Guadalupe Maple, MD as PCP - General (Family Medicine)   Assessment:     Exercise Activities and Dietary recommendations Current Exercise Habits: Structured exercise class, Type of exercise: walking (water aerobics ), Time (Minutes): > 60, Frequency (Times/Week): 4, Weekly Exercise (Minutes/Week): 0, Intensity: Moderate, Exercise limited by: None identified  Goals    None     Fall Risk Fall Risk  04/20/2017 12/16/2016 10/08/2016 07/17/2016 09/12/2015  Falls in the past year? No No Yes No No  Number falls in past yr: - - 1 - -  Injury with Fall? - - No - -   Depression Screen PHQ 2/9 Scores 04/20/2017 07/17/2016 09/12/2015 07/05/2015  PHQ - 2 Score 0 0 0 0    Cognitive Function     6CIT Screen 04/20/2017  What Year? 0 points  What month? 0 points  What time? 0 points  Count back from 20 0 points  Repeat phrase 0 points    Immunization History  Administered Date(s) Administered  . Influenza Whole 04/09/2009  . Influenza,inj,Quad  PF,6+ Mos 04/24/2015  . Influenza-Unspecified 03/07/2014, 04/06/2017  . Td 03/12/2005  . Tdap 07/17/2016  . Zoster 07/17/2016   Screening Tests Health Maintenance  Topic Date Due  . OPHTHALMOLOGY EXAM  04/08/2016  . HEMOGLOBIN A1C  04/09/2017  . PNA vac Low Risk Adult (1 of 2 - PCV13) 10/08/2017 (Originally 01/21/2016)  . FOOT EXAM  10/08/2017  . COLONOSCOPY  08/24/2022  . TETANUS/TDAP  07/17/2026  . INFLUENZA VACCINE  Completed  . Hepatitis C Screening  Completed      Plan:    I have personally reviewed and addressed the Medicare Annual Wellness questionnaire and have noted the following in the patient's chart:  A. Medical and social history B. Use of alcohol, tobacco or illicit drugs  C. Current medications and supplements D. Functional ability and status E.  Nutritional status F.  Physical activity G. Advance directives H. List of other physicians I.  Hospitalizations, surgeries, and ER visits in previous 12 months J.  [place] such as hearing and vision if needed, cognitive and depression L. Referrals and appointments  In addition, I have reviewed and discussed with patient certain preventive protocols, quality metrics, and best practice recommendations. A written personalized care plan for preventive services as well as general preventive health recommendations were provided to patient.   Signed,  Tyler Aas, LPN Nurse Health Advisor   MD Recommendations: patient to check BP again when he returns home to make sure it is still coming down. Last recheck 186/85., unable to do manual BP today due to office relocation.

## 2017-05-09 DIAGNOSIS — J019 Acute sinusitis, unspecified: Secondary | ICD-10-CM | POA: Diagnosis not present

## 2017-05-09 DIAGNOSIS — R05 Cough: Secondary | ICD-10-CM | POA: Diagnosis not present

## 2017-07-01 ENCOUNTER — Other Ambulatory Visit: Payer: PPO

## 2017-07-01 DIAGNOSIS — C61 Malignant neoplasm of prostate: Secondary | ICD-10-CM

## 2017-07-02 LAB — PSA: Prostate Specific Ag, Serum: 5 ng/mL — ABNORMAL HIGH (ref 0.0–4.0)

## 2017-07-03 ENCOUNTER — Ambulatory Visit: Payer: PPO | Admitting: Urology

## 2017-07-03 ENCOUNTER — Encounter: Payer: Self-pay | Admitting: Urology

## 2017-07-03 VITALS — BP 151/76 | HR 61 | Ht 71.0 in | Wt 216.1 lb

## 2017-07-03 DIAGNOSIS — C61 Malignant neoplasm of prostate: Secondary | ICD-10-CM | POA: Diagnosis not present

## 2017-07-03 DIAGNOSIS — N400 Enlarged prostate without lower urinary tract symptoms: Secondary | ICD-10-CM

## 2017-07-03 NOTE — Progress Notes (Signed)
07/03/2017 3:48 PM   Andrew Cobb 05/13/1951 [id number]  Referring provider: Guadalupe Maple, MD [address] [place], [place] [address]  Chief Complaint  Patient presents with  . Elevated PSA    HPI: The patient is a 66 year old gentleman who presents today to continue active surveillance.  1 - Very Low Risk Prostate Cancer  - 3 Cores Gleason 6 up to 18% of core LMM, LMA, LLM by biopsy May 2018 on eval PSA 4.8. TRUS 54mL with small median lobe. PSA 5.0 in December 2018.  2 - Recurrent Scrotal Abscess - pt witih several bouts of MRSA groin-scrotal abscess and cellulits that has responded to PO meds. He is diabetic.  Required I&D in OR in August 2018 for 6 cm abscess per  3 - Enlarged Prostate with Lower Urinary Tract Symptoms - s/p thermotherapy by Andrew Cobb around 2008 for mix of obsructive and irritative smptoms with good control.  PMH sig for DM2 (A1c <1), Obesity, Knee replacement, Gastric Bypass (lost 70lbs and kept off). His PCP is Andrew Pop MD.  Today "Andrew Cobb" is seen in f/u above and discuss management options for new low risk prostate cancer.    PMH: Past Medical History:  Diagnosis Date  . Arthritis   . Asthma   . Cancer [facility])    prostate  . Chronic venous insufficiency    varicose vein lower extremity with inflammation  . GERD (gastroesophageal reflux disease)    no issues since gastric bypass surgery as stated per pt  . Hyperlipidemia   . Hypogonadism in male   . MRSA (methicillin resistant Staphylococcus aureus) infection    07/30/2008 thru 08/07/2008  . Stented coronary artery   . Thrombocythemia [place])     Surgical History: Past Surgical History:  Procedure Laterality Date  . ANGIOPLASTY    . ANGIOPLASTY     with stent 04/07/1995  . APPENDECTOMY     1966  . CARDIOVASCULAR STRESS TEST     07/31/2011  . CARPAL TUNNEL RELEASE Left   . CHOLECYSTECTOMY     2006  . colonscopy      08/25/2012  . FUNCTIONAL ENDOSCOPIC SINUS SURGERY     11/10/2013   . GASTRIC BYPASS     10/05/2012  . HERNIA REPAIR     left inguinal 1981  . INCISION AND DRAINAGE ABSCESS Right 02/26/2017   Procedure: INCISION AND DRAINAGE ABSCESS;  Surgeon: Nickie Retort, MD;  Location: [facility] ORS;  Service: Urology;  Laterality: Right;  . JOINT REPLACEMENT     bilateral  . left ankle surgery      05/03/2003   . left carpel tunnel      09/18/1993  . left knee meniscal tear      01/25/2010  . left knee meniscal tear repair      05/04/1996  . left rotator cuff repair      05/03/2003   . REPLACEMENT TOTAL KNEE BILATERAL  07/13/2015  . right ankle surgery      fracture has 2 screws 07/07/1997  . right carpel tunnel      05/16/1992  . right shoulder replacement      01/27/2006  . SCROTAL EXPLORATION Right 02/26/2017   Procedure: SCROTUM EXPLORATION;  Surgeon: Nickie Retort, MD;  Location: [facility] ORS;  Service: Urology;  Laterality: Right;  . TOTAL KNEE ARTHROPLASTY Left 07/13/2015   Procedure: LEFT TOTAL KNEE ARTHROPLASTY;  Surgeon: Gaynelle Arabian, MD;  Location: [facility];  Service: Orthopedics;  Laterality: Left;  Home Medications:  Allergies as of 07/03/2017      Reactions   Sulfamethoxazole-trimethoprim    REACTION: Drug rash   Tetracyclines & Related Rash      Medication List        Accurate as of 07/03/17  3:48 PM. Always use your most recent med list.          cetirizine 10 MG tablet Commonly known as:  ZYRTEC Take 10 mg by mouth daily.   glucose blood test strip 1 each by Other route 2 (two) times daily. DX E11.9   lisinopril 10 MG tablet Commonly known as:  PRINIVIL,ZESTRIL Take 1 tablet (10 mg total) by mouth daily.   Magnesium 250 MG Tabs Take by mouth.   metFORMIN 500 MG tablet Commonly known as:  GLUCOPHAGE Take 1 tablet (500 mg total) by mouth 2 (two) times daily with a meal.   MULTIVITAMIN PO Take by mouth.   onetouch ultrasoft lancets 1 each by Other route 2 (two) times daily. Dx E11.9   valACYclovir 500 MG  tablet Commonly known as:  VALTREX Take 1 tablet (500 mg total) by mouth daily.   Vitamin D3 5000 units Caps Take by mouth.       Allergies:  Allergies  Allergen Reactions  . Sulfamethoxazole-Trimethoprim     REACTION: Drug rash  . Tetracyclines & Related Rash    Family History: Family History  Problem Relation Age of Onset  . Cancer Mother        pancreatic  . Diabetes Mother   . Stroke Mother   . Heart disease Mother   . Hyperlipidemia Mother   . Hypertension Mother   . Heart attack Mother   . Heart disease Father   . Stroke Father   . Diabetes Father   . Hypertension Father   . Heart attack Father   . Hyperlipidemia Father   . Pancreatic cancer Father   . Cancer Sister   . Cancer Brother        lung  . Cancer Brother   . Kidney cancer Neg Hx   . Bladder Cancer Neg Hx   . Prostate cancer Neg Hx     Social History:  reports that he quit smoking about 33 years ago. His smoking use included cigarettes. He has a 10.00 pack-year smoking history. he has never used smokeless tobacco. He reports that he does not drink alcohol or use drugs.  ROS: UROLOGY Frequent Urination?: Yes Hard to postpone urination?: No Burning/pain with urination?: No Get up at night to urinate?: Yes Leakage of urine?: No Urine stream starts and stops?: No Trouble starting stream?: No Do you have to strain to urinate?: No Blood in urine?: No Urinary tract infection?: No Sexually transmitted disease?: No Injury to kidneys or bladder?: No Painful intercourse?: No Weak stream?: No Erection problems?: No Penile pain?: No  Gastrointestinal Nausea?: No Vomiting?: No Indigestion/heartburn?: No Diarrhea?: No Constipation?: No  Constitutional Fever: No Night sweats?: No Weight loss?: No Fatigue?: No  Skin Skin rash/lesions?: No Itching?: No  Eyes Blurred vision?: No Double vision?: No  Ears/Nose/Throat Sore throat?: No Sinus problems?:  No  Hematologic/Lymphatic Swollen glands?: No Easy bruising?: No  Cardiovascular Leg swelling?: No Chest pain?: No  Respiratory Cough?: No Shortness of breath?: No  Endocrine Excessive thirst?: No  Musculoskeletal Back pain?: Yes Joint pain?: Yes  Neurological Headaches?: No Dizziness?: No  Psychologic Depression?: No Anxiety?: No  Physical Exam: BP (!) 151/76 (BP Location: Right Arm, Patient Position: Sitting,  Cuff Size: Normal)   Pulse 61   Ht 5\' 11"  (1.803 m)   Wt 216 lb 1.6 oz (98 kg)   BMI 30.14 kg/m   Constitutional:  Alert and oriented, No acute distress. HEENT: [place] AT, moist mucus membranes.  Trachea midline, no masses. Cardiovascular: No clubbing, cyanosis, or edema. Respiratory: Normal respiratory effort, no increased work of breathing. GI: Abdomen is soft, nontender, nondistended, no abdominal masses GU: No CVA tenderness.  Skin: No rashes, bruises or suspicious lesions. Lymph: No cervical or inguinal adenopathy. Neurologic: Grossly intact, no focal deficits, moving all 4 extremities. Psychiatric: Normal mood and affect.  Laboratory Data: Lab Results  Component Value Date   WBC 5.4 02/26/2017   HGB 13.8 02/26/2017   HCT 40.8 02/26/2017   MCV 91.6 02/26/2017   PLT 174 02/26/2017    Lab Results  Component Value Date   CREATININE 1.16 02/26/2017    No results found for: PSA  No results found for: TESTOSTERONE  Lab Results  Component Value Date   HGBA1C 6.0 (H) 10/08/2015    Urinalysis    Component Value Date/Time   COLORURINE YELLOW 07/05/2015 1002   APPEARANCEUR Clear 02/23/2017 0935   LABSPEC 1.018 07/05/2015 1002   PHURINE 5.5 07/05/2015 1002   GLUCOSEU Negative 02/23/2017 0935   HGBUR NEGATIVE 07/05/2015 1002   BILIRUBINUR Negative 02/23/2017 0935   KETONESUR NEGATIVE 07/05/2015 1002   PROTEINUR 1+ (A) 02/23/2017 0935   PROTEINUR NEGATIVE 07/05/2015 1002   UROBILINOGEN 0.2 07/11/2012 1443   NITRITE Negative 02/23/2017  0935   NITRITE NEGATIVE 07/05/2015 1002   LEUKOCYTESUR Negative 02/23/2017 0935    Assessment & Plan:    1 - Low Risk Prostate Cancer - due for active surveillance biopsy within 6 months. Will plan for this. Will draw repeat PSA on same day  2 - Recurrent Scrotal Abscess - no evidence of active infection. His better glycemic control after gastric bypass will hopefuly make this less frequent.   3 - Enlarged Prostate with Lower Urinary Tract Symptoms - minimal bother at present, observe. Would consider future finasteride based on exam if bother were to progress.  Return in about 6 months (around 01/01/2018) for prostate biopsy.  Nickie Retort, MD  [facility] [address], [place], [place] [address] [phone]

## 2017-07-08 DIAGNOSIS — R030 Elevated blood-pressure reading, without diagnosis of hypertension: Secondary | ICD-10-CM | POA: Diagnosis not present

## 2017-07-08 DIAGNOSIS — M5412 Radiculopathy, cervical region: Secondary | ICD-10-CM | POA: Diagnosis not present

## 2017-07-08 DIAGNOSIS — Z6830 Body mass index (BMI) 30.0-30.9, adult: Secondary | ICD-10-CM | POA: Diagnosis not present

## 2017-07-21 ENCOUNTER — Ambulatory Visit (INDEPENDENT_AMBULATORY_CARE_PROVIDER_SITE_OTHER): Payer: PPO | Admitting: Family Medicine

## 2017-07-21 ENCOUNTER — Encounter: Payer: Self-pay | Admitting: Family Medicine

## 2017-07-21 VITALS — BP 139/87 | HR 49 | Ht 71.0 in | Wt 213.0 lb

## 2017-07-21 DIAGNOSIS — C61 Malignant neoplasm of prostate: Secondary | ICD-10-CM

## 2017-07-21 DIAGNOSIS — I2584 Coronary atherosclerosis due to calcified coronary lesion: Secondary | ICD-10-CM

## 2017-07-21 DIAGNOSIS — I2510 Atherosclerotic heart disease of native coronary artery without angina pectoris: Secondary | ICD-10-CM | POA: Diagnosis not present

## 2017-07-21 DIAGNOSIS — I10 Essential (primary) hypertension: Secondary | ICD-10-CM | POA: Diagnosis not present

## 2017-07-21 DIAGNOSIS — E119 Type 2 diabetes mellitus without complications: Secondary | ICD-10-CM

## 2017-07-21 DIAGNOSIS — Z0000 Encounter for general adult medical examination without abnormal findings: Secondary | ICD-10-CM

## 2017-07-21 DIAGNOSIS — Z1329 Encounter for screening for other suspected endocrine disorder: Secondary | ICD-10-CM

## 2017-07-21 DIAGNOSIS — Z0001 Encounter for general adult medical examination with abnormal findings: Secondary | ICD-10-CM

## 2017-07-21 DIAGNOSIS — Z7189 Other specified counseling: Secondary | ICD-10-CM | POA: Insufficient documentation

## 2017-07-21 DIAGNOSIS — R972 Elevated prostate specific antigen [PSA]: Secondary | ICD-10-CM | POA: Diagnosis not present

## 2017-07-21 DIAGNOSIS — Z7984 Long term (current) use of oral hypoglycemic drugs: Secondary | ICD-10-CM

## 2017-07-21 LAB — URINALYSIS, ROUTINE W REFLEX MICROSCOPIC
BILIRUBIN UA: NEGATIVE
GLUCOSE, UA: NEGATIVE
KETONES UA: NEGATIVE
Leukocytes, UA: NEGATIVE
Nitrite, UA: NEGATIVE
PROTEIN UA: NEGATIVE
RBC, UA: NEGATIVE
Specific Gravity, UA: 1.015 (ref 1.005–1.030)
UUROB: 0.2 mg/dL (ref 0.2–1.0)
pH, UA: 6 (ref 5.0–7.5)

## 2017-07-21 LAB — BAYER DCA HB A1C WAIVED: HB A1C: 7.5 % — AB (ref <7.0)

## 2017-07-21 MED ORDER — METFORMIN HCL 500 MG PO TABS: 500.0000 mg | ORAL_TABLET | Freq: Every day | ORAL | 4 refills | Status: DC

## 2017-07-21 MED ORDER — VALACYCLOVIR HCL 500 MG PO TABS: 500.0000 mg | ORAL_TABLET | Freq: Every day | ORAL | 4 refills | Status: DC

## 2017-07-21 MED ORDER — LISINOPRIL 10 MG PO TABS: 10.0000 mg | ORAL_TABLET | Freq: Every day | ORAL | 4 refills | Status: DC

## 2017-07-21 NOTE — Progress Notes (Signed)
BP 139/87   Pulse (!) 49   Ht 5\' 11"  (1.803 m)   Wt 213 lb (96.6 kg)   SpO2 99%   BMI 29.71 kg/m    Subjective:    Patient ID: Andrew Cobb, male    DOB: 09/10/1950, 67 y.o.   MRN: [id number]  HPI: RAYMUND MANRIQUE is a 67 y.o. male  Chief Complaint  Patient presents with  . Annual Exam  . Cancer    prostate- increased .02 to 5%  . Toe Pain    Left foot pinky toe  Patient with eventful year this year has been diagnosed with prostate cancer and is now going to be getting prostate biopsies the current schedule is every 6 months. Blood pressure doing well no complaints from lisinopril. Takes metformin 500 mg 1 a day with good control of diabetes.  No issues no low blood sugar spells. Takes Valtrex 500 once a day with occasional outbreak will double up on medication. Also pending neck surgery for cervical disc Also had MRSA and scrotum area  Relevant past medical, surgical, family and social history reviewed and updated as indicated. Interim medical history since our last visit reviewed. Allergies and medications reviewed and updated.  Review of Systems  Constitutional: Negative.   HENT: Negative.   Eyes: Negative.   Respiratory: Negative.   Cardiovascular: Negative.   Gastrointestinal: Negative.   Endocrine: Negative.   Genitourinary: Negative.   Musculoskeletal: Negative.   Skin: Negative.   Allergic/Immunologic: Negative.   Neurological: Negative.   Hematological: Negative.   Psychiatric/Behavioral: Negative.     Per HPI unless specifically indicated above     Objective:    BP 139/87   Pulse (!) 49   Ht 5\' 11"  (1.803 m)   Wt 213 lb (96.6 kg)   SpO2 99%   BMI 29.71 kg/m   Wt Readings from Last 3 Encounters:  07/21/17 213 lb (96.6 kg)  07/03/17 216 lb 1.6 oz (98 kg)  04/20/17 212 lb 9.6 oz (96.4 kg)    Physical Exam  Constitutional: He is oriented to person, place, and time. He appears well-developed and well-nourished.  HENT:  Head:  Normocephalic and atraumatic.  Right Ear: External ear normal.  Left Ear: External ear normal.  Eyes: Conjunctivae and EOM are normal. Pupils are equal, round, and reactive to light.  Neck: Normal range of motion. Neck supple.  Cardiovascular: Normal rate, regular rhythm, normal heart sounds and intact distal pulses.  Pulmonary/Chest: Effort normal and breath sounds normal.  Abdominal: Soft. Bowel sounds are normal. There is no splenomegaly or hepatomegaly.  Genitourinary:  Genitourinary Comments: Prostate cancer followed by urology  Musculoskeletal: Normal range of motion.  Neurological: He is alert and oriented to person, place, and time. He has normal reflexes.  Skin: No rash noted. No erythema.  Psychiatric: He has a normal mood and affect. His behavior is normal. Judgment and thought content normal.    Results for orders placed or performed in visit on 07/01/17  PSA  Result Value Ref Range   Prostate Specific Ag, Serum 5.0 (H) 0.0 - 4.0 ng/mL      Assessment & Plan:   Problem List Items Addressed This Visit      Cardiovascular and Mediastinum   Coronary atherosclerosis    The current medical regimen is effective;  continue present plan and medications.       Relevant Medications   lisinopril (PRINIVIL,ZESTRIL) 10 MG tablet   Essential hypertension - Primary  The current medical regimen is effective;  continue present plan and medications.       Relevant Medications   lisinopril (PRINIVIL,ZESTRIL) 10 MG tablet   Other Relevant Orders   CBC with Differential/Platelet   Comprehensive metabolic panel   Lipid panel   Urinalysis, Routine w reflex microscopic   Bayer DCA Hb A1c Waived     Endocrine   Type 2 diabetes mellitus without complication, without long-term current use of insulin (HCC)    The current medical regimen is effective;  continue present plan and medications.       Relevant Medications   metFORMIN (GLUCOPHAGE) 500 MG tablet   lisinopril  (PRINIVIL,ZESTRIL) 10 MG tablet   Other Relevant Orders   CBC with Differential/Platelet   Comprehensive metabolic panel   Lipid panel   Urinalysis, Routine w reflex microscopic   Bayer DCA Hb A1c Waived     Genitourinary   Prostate cancer (HCC)    Stable getting bx      Relevant Medications   valACYclovir (VALTREX) 500 MG tablet     Other   Advanced care planning/counseling discussion    A voluntary discussion about advance care planning including the explanation and discussion of advance directives was extensively discussed  with the patient.  Explanation about the health care proxy and Living will was reviewed and packet with forms with explanation of how to fill them out was given.      RESOLVED: Elevated PSA    Other Visit Diagnoses    Thyroid disorder screen       Relevant Orders   TSH   PE (physical exam), annual           Follow up plan: Return in about 6 months (around 01/18/2018), or if symptoms worsen or fail to improve, for Hemoglobin A1c, BMP,  Lipids, ALT, AST.

## 2017-07-21 NOTE — Assessment & Plan Note (Signed)
The current medical regimen is effective;  continue present plan and medications.  

## 2017-07-21 NOTE — Assessment & Plan Note (Signed)
Stable getting bx

## 2017-07-21 NOTE — Assessment & Plan Note (Signed)
A voluntary discussion about advance care planning including the explanation and discussion of advance directives was extensively discussed  with the patient.  Explanation about the health care proxy and Living will was reviewed and packet with forms with explanation of how to fill them out was given.    

## 2017-07-22 LAB — CBC WITH DIFFERENTIAL/PLATELET
BASOS: 1 %
Basophils Absolute: 0 10*3/uL (ref 0.0–0.2)
EOS (ABSOLUTE): 0.2 10*3/uL (ref 0.0–0.4)
Eos: 4 %
Hematocrit: 46.4 % (ref 37.5–51.0)
Hemoglobin: 14.8 g/dL (ref 13.0–17.7)
Immature Grans (Abs): 0 10*3/uL (ref 0.0–0.1)
Immature Granulocytes: 0 %
LYMPHS ABS: 1.3 10*3/uL (ref 0.7–3.1)
Lymphs: 30 %
MCH: 29.7 pg (ref 26.6–33.0)
MCHC: 31.9 g/dL (ref 31.5–35.7)
MCV: 93 fL (ref 79–97)
MONOS ABS: 0.3 10*3/uL (ref 0.1–0.9)
Monocytes: 8 %
NEUTROS ABS: 2.5 10*3/uL (ref 1.4–7.0)
Neutrophils: 57 %
Platelets: 166 10*3/uL (ref 150–379)
RBC: 4.99 x10E6/uL (ref 4.14–5.80)
RDW: 13 % (ref 12.3–15.4)
WBC: 4.4 10*3/uL (ref 3.4–10.8)

## 2017-07-22 LAB — COMPREHENSIVE METABOLIC PANEL
A/G RATIO: 2 (ref 1.2–2.2)
ALT: 50 IU/L — ABNORMAL HIGH (ref 0–44)
AST: 33 IU/L (ref 0–40)
Albumin: 4.7 g/dL (ref 3.6–4.8)
Alkaline Phosphatase: 67 IU/L (ref 39–117)
BILIRUBIN TOTAL: 0.5 mg/dL (ref 0.0–1.2)
BUN/Creatinine Ratio: 14 (ref 10–24)
BUN: 15 mg/dL (ref 8–27)
CHLORIDE: 99 mmol/L (ref 96–106)
CO2: 23 mmol/L (ref 20–29)
Calcium: 9.3 mg/dL (ref 8.6–10.2)
Creatinine, Ser: 1.11 mg/dL (ref 0.76–1.27)
GFR calc non Af Amer: 69 mL/min/{1.73_m2} (ref >59)
GFR, EST AFRICAN AMERICAN: 80 mL/min/{1.73_m2} (ref >59)
Globulin, Total: 2.4 g/dL (ref 1.5–4.5)
Glucose: 173 mg/dL — ABNORMAL HIGH (ref 65–99)
POTASSIUM: 4.5 mmol/L (ref 3.5–5.2)
Sodium: 140 mmol/L (ref 134–144)
TOTAL PROTEIN: 7.1 g/dL (ref 6.0–8.5)

## 2017-07-22 LAB — LIPID PANEL
Chol/HDL Ratio: 4.8 ratio (ref 0.0–5.0)
Cholesterol, Total: 224 mg/dL — ABNORMAL HIGH (ref 100–199)
HDL: 47 mg/dL (ref >39)
LDL Calculated: 138 mg/dL — ABNORMAL HIGH (ref 0–99)
Triglycerides: 197 mg/dL — ABNORMAL HIGH (ref 0–149)
VLDL Cholesterol Cal: 39 mg/dL (ref 5–40)

## 2017-07-22 LAB — TSH: TSH: 2.18 u[IU]/mL (ref 0.450–4.500)

## 2017-07-23 ENCOUNTER — Telehealth: Payer: Self-pay | Admitting: Family Medicine

## 2017-07-23 DIAGNOSIS — M542 Cervicalgia: Secondary | ICD-10-CM | POA: Diagnosis not present

## 2017-07-23 DIAGNOSIS — M5412 Radiculopathy, cervical region: Secondary | ICD-10-CM | POA: Diagnosis not present

## 2017-07-23 MED ORDER — ATORVASTATIN CALCIUM 40 MG PO TABS: 40.0000 mg | ORAL_TABLET | Freq: Every day | ORAL | 4 refills | Status: DC

## 2017-07-23 NOTE — Telephone Encounter (Signed)
Phone call Discussed with patient elevated cholesterol will start atorvastatin patient education recheck lipid panel ALT AST next visit.

## 2017-08-03 DIAGNOSIS — D225 Melanocytic nevi of trunk: Secondary | ICD-10-CM | POA: Diagnosis not present

## 2017-08-03 DIAGNOSIS — L218 Other seborrheic dermatitis: Secondary | ICD-10-CM | POA: Diagnosis not present

## 2017-08-03 DIAGNOSIS — D2272 Melanocytic nevi of left lower limb, including hip: Secondary | ICD-10-CM | POA: Diagnosis not present

## 2017-08-03 DIAGNOSIS — D2261 Melanocytic nevi of right upper limb, including shoulder: Secondary | ICD-10-CM | POA: Diagnosis not present

## 2017-08-05 ENCOUNTER — Other Ambulatory Visit: Payer: Self-pay | Admitting: Neurological Surgery

## 2017-08-05 DIAGNOSIS — M4722 Other spondylosis with radiculopathy, cervical region: Secondary | ICD-10-CM | POA: Diagnosis not present

## 2017-08-11 NOTE — Pre-Procedure Instructions (Signed)
Andrew Cobb  08/11/2017     Your procedure is scheduled on February 11.  Report to [facility] Admitting at [place].M.  Call this number if you have problems the morning of surgery:  [phone]   Remember:  Do not eat food or drink liquids after midnight.  Take these medicines the morning of surgery with A SIP OF WATER Cetirizine,    STOP Multiple Vitamins, Magnesium, Zicam, Vitamin D today   STOP/ Do not take Aspirin, Aleve, Naproxen, Advil, Ibuprofen, Motrin, Vitamins, Herbs, or Supplements starting today      How to Manage Your Diabetes Before and After Surgery  Why is it important to control my blood sugar before and after surgery? . Improving blood sugar levels before and after surgery helps healing and can limit problems. . A way of improving blood sugar control is eating a healthy diet by: o  Eating less sugar and carbohydrates o  Increasing activity/exercise o  Talking with your doctor about reaching your blood sugar goals . High blood sugars (greater than 180 mg/dL) can raise your risk of infections and slow your recovery, so you will need to focus on controlling your diabetes during the weeks before surgery. . Make sure that the doctor who takes care of your diabetes knows about your planned surgery including the date and location.  How do I manage my blood sugar before surgery? . Check your blood sugar at least 4 times a day, starting 2 days before surgery, to make sure that the level is not too high or low. o Check your blood sugar the morning of your surgery when you wake up and every 2 hours until you get to the Short Stay unit. . If your blood sugar is less than 70 mg/dL, you will need to treat for low blood sugar: o Do not take insulin. o Treat a low blood sugar (less than 70 mg/dL) with  cup of clear juice (cranberry or apple), 4 glucose tablets, OR glucose gel. Recheck blood sugar in 15 minutes after treatment (to make sure it is greater  than 70 mg/dL). If your blood sugar is not greater than 70 mg/dL on recheck, call [phone] o  for further instructions. . Report your blood sugar to the short stay nurse when you get to Short Stay.  . If you are admitted to the hospital after surgery: o Your blood sugar will be checked by the staff and you will probably be given insulin after surgery (instead of oral diabetes medicines) to make sure you have good blood sugar levels. o The goal for blood sugar control after surgery is 80-180 mg/dL.  WHAT DO I DO ABOUT MY DIABETES MEDICATION?   [name] Kitchen Do not take oral diabetes medicines (pills) the morning of surgery.   Do not wear jewelry, make-up or nail polish.  Do not wear lotions, powders, or perfumes, or deodorant.  Do not shave 48 hours prior to surgery.  Men may shave face and neck.  Do not bring valuables to the hospital.  [facility] is not responsible for any belongings or valuables.  Contacts, dentures or bridgework may not be worn into surgery.  Leave your suitcase in the car.  After surgery it may be brought to your room.  For patients admitted to the hospital, discharge time will be determined by your treatment team.  Patients discharged the day of surgery will not be allowed to drive home.   [place] - Preparing for Surgery  Before surgery, you can play an important role.  Because skin is not sterile, your skin needs to be as free of germs as possible.  You can reduce the number of germs on you skin by washing with CHG (chlorahexidine gluconate) soap before surgery.  CHG is an antiseptic cleaner which kills germs and bonds with the skin to continue killing germs even after washing.  Please DO NOT use if you have an allergy to CHG or antibacterial soaps.  If your skin becomes reddened/irritated stop using the CHG and inform your nurse when you arrive at Short Stay.  Do not shave (including legs and underarms) for at least 48 hours prior to the first CHG shower.  You may  shave your face.  Please follow these instructions carefully:   1.  Shower with CHG Soap the night before surgery and the morning of Surgery.  2.  If you choose to wash your hair, wash your hair first as usual with your normal shampoo.  3.  After you shampoo, rinse your hair and body thoroughly to remove the shampoo.  4.  Use CHG as you would any other liquid soap.  You can apply CHG directly to the skin and wash gently with scrungie or a clean washcloth.  5.  Apply the CHG Soap to your body ONLY FROM THE NECK DOWN.  Do not use on open wounds or open sores.  Avoid contact with your eyes, ears, mouth and genitals (private parts).  Wash genitals (private parts) with your normal soap.  6.  Wash thoroughly, paying special attention to the area where your surgery will be performed.  7.  Thoroughly rinse your body with warm water from the neck down.  8.  DO NOT shower/wash with your normal soap after using and rinsing off the CHG Soap.  9.  Pat yourself dry with a clean towel.            10.  Wear clean pajamas.            11.  Place clean sheets on your bed the night of your first shower and do not sleep with pets.  Day of Surgery  Do not apply any lotions the morning of surgery.  Please wear clean clothes to the hospital/surgery center.

## 2017-08-12 ENCOUNTER — Telehealth: Payer: Self-pay | Admitting: Family Medicine

## 2017-08-12 ENCOUNTER — Encounter (HOSPITAL_COMMUNITY)
Admission: RE | Admit: 2017-08-12 | Discharge: 2017-08-12 | Disposition: A | Discharge status: Home / Self Care | Payer: PPO | Source: Ambulatory Visit | Attending: Neurological Surgery | Admitting: Neurological Surgery

## 2017-08-12 ENCOUNTER — Encounter (HOSPITAL_COMMUNITY): Payer: Self-pay

## 2017-08-12 ENCOUNTER — Other Ambulatory Visit: Payer: Self-pay

## 2017-08-12 DIAGNOSIS — I10 Essential (primary) hypertension: Secondary | ICD-10-CM | POA: Insufficient documentation

## 2017-08-12 DIAGNOSIS — R001 Bradycardia, unspecified: Secondary | ICD-10-CM | POA: Insufficient documentation

## 2017-08-12 DIAGNOSIS — E119 Type 2 diabetes mellitus without complications: Secondary | ICD-10-CM | POA: Insufficient documentation

## 2017-08-12 DIAGNOSIS — Z01812 Encounter for preprocedural laboratory examination: Secondary | ICD-10-CM | POA: Insufficient documentation

## 2017-08-12 DIAGNOSIS — Z01810 Encounter for preprocedural cardiovascular examination: Secondary | ICD-10-CM | POA: Diagnosis not present

## 2017-08-12 LAB — BASIC METABOLIC PANEL
ANION GAP: 12 (ref 5–15)
BUN: 18 mg/dL (ref 6–20)
CHLORIDE: 103 mmol/L (ref 101–111)
CO2: 23 mmol/L (ref 22–32)
Calcium: 9.2 mg/dL (ref 8.9–10.3)
Creatinine, Ser: 1.05 mg/dL (ref 0.61–1.24)
GFR calc non Af Amer: >60 mL/min (ref >60)
Glucose, Bld: 172 mg/dL — ABNORMAL HIGH (ref 65–99)
POTASSIUM: 4.3 mmol/L (ref 3.5–5.1)
Sodium: 138 mmol/L (ref 135–145)

## 2017-08-12 LAB — CBC
HEMATOCRIT: 44.6 % (ref 39.0–52.0)
HEMOGLOBIN: 14.8 g/dL (ref 13.0–17.0)
MCH: 31.6 pg (ref 26.0–34.0)
MCHC: 33.2 g/dL (ref 30.0–36.0)
MCV: 95.3 fL (ref 78.0–100.0)
PLATELETS: 123 10*3/uL — AB (ref 150–400)
RBC: 4.68 MIL/uL (ref 4.22–5.81)
RDW: 13.6 % (ref 11.5–15.5)
WBC: 4.9 10*3/uL (ref 4.0–10.5)

## 2017-08-12 LAB — GLUCOSE, CAPILLARY: Glucose-Capillary: 135 mg/dL — ABNORMAL HIGH (ref 65–99)

## 2017-08-12 LAB — SURGICAL PCR SCREEN
MRSA, PCR: POSITIVE — AB
STAPHYLOCOCCUS AUREUS: POSITIVE — AB

## 2017-08-12 MED ORDER — GLUCOSE BLOOD VI STRP: 1.0000 | ORAL_STRIP | Freq: Two times a day (BID) | 12 refills | Status: DC

## 2017-08-12 NOTE — Progress Notes (Signed)
perscription called in at [place], patient made aware and verbalized understanding

## 2017-08-12 NOTE — Pre-Procedure Instructions (Signed)
Andrew Cobb  08/12/2017      [place] [address]), [place] [address] [place] ([place]) [place] [address] Phone: [phone] Fax: [phone]    Your procedure is scheduled on February 11  Report to [place] at [place].M.  Call this number if you have problems the morning of surgery:  [phone]   Remember:  Do not eat food or drink liquids after midnight.  Continue all medications as directed by your physician except follow these medication instructions before surgery below   Take these medicines the morning of surgery with A SIP OF WATER  cetirizine (ZYRTEC)  7 days prior to surgery STOP taking any Aspirin(unless otherwise instructed by your surgeon), Aleve, Naproxen, Ibuprofen, Motrin, Advil, Goody's, BC's, all herbal medications, fish oil, and all vitamins   WHAT DO I DO ABOUT MY DIABETES MEDICATION?   [name] Kitchen Do not take oral diabetes medicines (pills) the morning of surgery. metFORMIN (GLUCOPHAGE)    How to Manage Your Diabetes Before and After Surgery  Why is it important to control my blood sugar before and after surgery? . Improving blood sugar levels before and after surgery helps healing and can limit problems. . A way of improving blood sugar control is eating a healthy diet by: o  Eating less sugar and carbohydrates o  Increasing activity/exercise o  Talking with your doctor about reaching your blood sugar goals . High blood sugars (greater than 180 mg/dL) can raise your risk of infections and slow your recovery, so you will need to focus on controlling your diabetes during the weeks before surgery. . Make sure that the doctor who takes care of your diabetes knows about your planned surgery including the date and location.  How do I manage my blood sugar before surgery? . Check your blood sugar at least 4 times a day, starting 2 days before surgery, to make sure that the level is  not too high or low. o Check your blood sugar the morning of your surgery when you wake up and every 2 hours until you get to the Short Stay unit. . If your blood sugar is less than 70 mg/dL, you will need to treat for low blood sugar: o Do not take insulin. o Treat a low blood sugar (less than 70 mg/dL) with  cup of clear juice (cranberry or apple), 4 glucose tablets, OR glucose gel. o Recheck blood sugar in 15 minutes after treatment (to make sure it is greater than 70 mg/dL). If your blood sugar is not greater than 70 mg/dL on recheck, call [phone] for further instructions. . Report your blood sugar to the short stay nurse when you get to Short Stay.  . If you are admitted to the hospital after surgery: o Your blood sugar will be checked by the staff and you will probably be given insulin after surgery (instead of oral diabetes medicines) to make sure you have good blood sugar levels. o The goal for blood sugar control after surgery is 80-180 mg/dL.     Do not wear jewelry  Do not wear lotions, powders, or cologne, or deodorant.   Men may shave face and neck.  Do not bring valuables to the hospital.  [facility] is not responsible for any belongings or valuables.  Contacts, dentures or bridgework may not be worn into surgery.  Leave your suitcase in the car.  After surgery it may be brought to your  room.  For patients admitted to the hospital, discharge time will be determined by your treatment team.  Patients discharged the day of surgery will not be allowed to drive home.    Special instructions:   [place]- Preparing For Surgery  Before surgery, you can play an important role. Because skin is not sterile, your skin needs to be as free of germs as possible. You can reduce the number of germs on your skin by washing with CHG (chlorahexidine gluconate) Soap before surgery.  CHG is an antiseptic cleaner which kills germs and bonds with the skin to continue killing germs even  after washing.  Please do not use if you have an allergy to CHG or antibacterial soaps. If your skin becomes reddened/irritated stop using the CHG.  Do not shave (including legs and underarms) for at least 48 hours prior to first CHG shower. It is OK to shave your face.  Please follow these instructions carefully.   1. Shower the NIGHT BEFORE SURGERY and the MORNING OF SURGERY with CHG.   2. If you chose to wash your hair, wash your hair first as usual with your normal shampoo.  3. After you shampoo, rinse your hair and body thoroughly to remove the shampoo.  4. Use CHG as you would any other liquid soap. You can apply CHG directly to the skin and wash gently with a scrungie or a clean washcloth.   5. Apply the CHG Soap to your body ONLY FROM THE NECK DOWN.  Do not use on open wounds or open sores. Avoid contact with your eyes, ears, mouth and genitals (private parts). Wash Face and genitals (private parts)  with your normal soap.  6. Wash thoroughly, paying special attention to the area where your surgery will be performed.  7. Thoroughly rinse your body with warm water from the neck down.  8. DO NOT shower/wash with your normal soap after using and rinsing off the CHG Soap.  9. Pat yourself dry with a CLEAN TOWEL.  10. Wear CLEAN PAJAMAS to bed the night before surgery, wear comfortable clothes the morning of surgery  11. Place CLEAN SHEETS on your bed the night of your first shower and DO NOT SLEEP WITH PETS.    Day of Surgery: Do not apply any deodorants/lotions. Please wear clean clothes to the hospital/surgery center.      Please read over the following fact sheets that you were given.

## 2017-08-12 NOTE — Telephone Encounter (Signed)
ok 

## 2017-08-12 NOTE — Telephone Encounter (Signed)
Copied from [place] [id number]. Topic: Quick Communication - Rx Refill/Question >> Aug 12, 2017  9:20 AM Lolita Rieger, RMA wrote: Medication: test strips for onetouch ultra 2   Has the patient contacted their pharmacy? yes   (Agent: If no, request that the patient contact the pharmacy for the refill.)   Preferred Pharmacy (with phone number or street name): [facility] on [address]   Agent: Please be advised that RX refills may take up to 3 business days. We ask that you follow-up with your pharmacy.

## 2017-08-12 NOTE — Progress Notes (Signed)
PCP - Golden Pop Cardiologist - [place] seen in 5 years patient stated that MD released patient  Chest x-ray - not needed EKG - 08/12/17 Stress Test - 2013 ECHO - 1996 Cardiac Cath - 1996  Sleep Study - 2015 BIPAP - at night  Fasting Blood Sugar - 115-120 Checks Blood Sugar __1___ times a day    Anesthesia review: YES  Patient denies shortness of breath, fever, cough and chest pain at PAT appointment   Patient verbalized understanding of instructions that were given to them at the PAT appointment. Patient was also instructed that they will need to review over the PAT instructions again at home before surgery.

## 2017-08-13 NOTE — Progress Notes (Signed)
Anesthesia chart review: Patient is a 67 year old male scheduled for C3-7 ACDF on 08/17/2017 by Dr. [name] Kitchen Ditty.  History includes former smoker (quit '86), CAD (PTCA/stent RCA '96, [facility]), DM2, GERD, asthma, thrombocytopenia, hyperlipidemia, chronic venous insufficiency, prostate cancer, OSA (BiPAP), I&D right scrotal abscess 02/26/17 (MRSA), left TKA 07/13/15, right TKA 04/20/06, cholecystectomy, gastric bypass, appendectomy, sinus surgery, right shoulder replacement.   PCP is Dr. Golden Pop. Last visit 07/21/17 for annual exam. He was aware of pending neck surgery. Continued current care for CAD recommended.   Cardiologist is Dr. Peggye Form. Last visit 06/07/15 for preoperative evaluation prior to left TKA. (CAD currently being managed by PCP.)  Meds include Lipitor, Zyrtec, Zicam, lisinopril, magnesium, metformin, Valtrex.  BP (!) 150/66   Pulse (!) 55   Resp 20   Ht 5\' 10"  (1.778 m)   Wt 214 lb 8 oz (97.3 kg)   SpO2 100%   BMI 30.78 kg/m    EKG 08/12/17: SB at 52 bpm, inferior infarct (age undetermined). No significant change when compared to 07/17/16 tracing.  Exercise Treadmill Test 07/31/11 [facility]; scanned under Media tab; Correspondence 07/13/15): Conclusion: Negative ETT.  PCI 04/07/95: Proximal RCA 75% (pre) to < 25% (post). LM, LCX, LAD not evaluated..  Carotid U/S 11/19/06 ([place]): Right carotid The extracranial vessels were near-normal with only minimal wall  thickening or plaque.  Left carotidThe extracranial vessels were near-normal with only minimal wall  thickening or plaque.  Subclavians Normal flow hemodynamics were seen in both subclavian arteries. VertebralsBoth vertebral arteries were patent with antegrade flow.   Preoperative labs noted.  Creatinine 1.05.  Glucose 172.  H&H 14.8 and 44.6.  Platelet count 123 (previously 166K 07/21/17). AST 33, ALT 50 on 07/21/17. A1c 7.5 on 07/21/17.   Currently patient CAD is being  managed by his PCP.  He had recent evaluation for yearly exam.  Dr. Charlane Ferretti is aware of surgery plans.  Continue current care for CAD recommended.  Discussed with anesthesiologist Dr. Renold Don.  If patient remains asymptomatic from a CV standpoint and otherwise no acute changes in his anticipate that he can proceed as planned.  George Hugh [facility] Short Stay Center/Anesthesiology Phone [phone] 08/13/2017 1:31 PM

## 2017-08-16 NOTE — Anesthesia Preprocedure Evaluation (Addendum)
Anesthesia Evaluation  Patient identified by MRN, date of birth, ID band Patient awake    Reviewed: Allergy & Precautions, NPO status , Patient's Chart, lab work & pertinent test results  Airway Mallampati: I  TM Distance: >3 FB Neck ROM: Full    Dental  (+) Dental Advidsory Given, Teeth Intact   Pulmonary asthma , sleep apnea , former smoker,    breath sounds clear to auscultation       Cardiovascular hypertension, + CAD, + Cardiac Stents and + Peripheral Vascular Disease   Rhythm:Regular Rate:Bradycardia     Neuro/Psych    GI/Hepatic GERD  ,  Endo/Other  diabetes  Renal/GU      Musculoskeletal  (+) Arthritis ,   Abdominal   Peds  Hematology negative hematology ROS (+)   Anesthesia Other Findings   Reproductive/Obstetrics                           Anesthesia Physical Anesthesia Plan  ASA: III  Anesthesia Plan: General   Post-op Pain Management:    Induction: Intravenous  PONV Risk Score and Plan: 3 and Treatment may vary due to age or medical condition, Ondansetron and Dexamethasone  Airway Management Planned: Oral ETT  Additional Equipment:   Intra-op Plan:   Post-operative Plan: Extubation in OR  Informed Consent: I have reviewed the patients History and Physical, chart, labs and discussed the procedure including the risks, benefits and alternatives for the proposed anesthesia with the patient or authorized representative who has indicated his/her understanding and acceptance.   Dental advisory given and Dental Advisory Given  Plan Discussed with: CRNA  Anesthesia Plan Comments:        Anesthesia Quick Evaluation

## 2017-08-17 ENCOUNTER — Inpatient Hospital Stay (HOSPITAL_COMMUNITY)
Admission: RE | Admit: 2017-08-17 | Discharge: 2017-08-18 | DRG: 473 | Disposition: A | Discharge status: Home / Self Care | Payer: PPO | Source: Ambulatory Visit | Attending: Neurological Surgery | Admitting: Neurological Surgery

## 2017-08-17 ENCOUNTER — Inpatient Hospital Stay (HOSPITAL_COMMUNITY): Payer: PPO | Admitting: Vascular Surgery

## 2017-08-17 ENCOUNTER — Inpatient Hospital Stay (HOSPITAL_COMMUNITY): Payer: PPO | Admitting: Anesthesiology

## 2017-08-17 ENCOUNTER — Inpatient Hospital Stay (HOSPITAL_COMMUNITY): Payer: PPO

## 2017-08-17 ENCOUNTER — Inpatient Hospital Stay (HOSPITAL_COMMUNITY)
Admission: RE | Disposition: A | Discharge status: Home / Self Care | Payer: Self-pay | Source: Ambulatory Visit | Attending: Neurological Surgery

## 2017-08-17 ENCOUNTER — Encounter (HOSPITAL_COMMUNITY): Payer: Self-pay

## 2017-08-17 DIAGNOSIS — Z955 Presence of coronary angioplasty implant and graft: Secondary | ICD-10-CM

## 2017-08-17 DIAGNOSIS — E119 Type 2 diabetes mellitus without complications: Secondary | ICD-10-CM | POA: Diagnosis present

## 2017-08-17 DIAGNOSIS — Z9049 Acquired absence of other specified parts of digestive tract: Secondary | ICD-10-CM | POA: Diagnosis not present

## 2017-08-17 DIAGNOSIS — Z419 Encounter for procedure for purposes other than remedying health state, unspecified: Secondary | ICD-10-CM

## 2017-08-17 DIAGNOSIS — M4722 Other spondylosis with radiculopathy, cervical region: Principal | ICD-10-CM | POA: Diagnosis present

## 2017-08-17 DIAGNOSIS — Z96611 Presence of right artificial shoulder joint: Secondary | ICD-10-CM | POA: Diagnosis not present

## 2017-08-17 DIAGNOSIS — Z9989 Dependence on other enabling machines and devices: Secondary | ICD-10-CM

## 2017-08-17 DIAGNOSIS — E785 Hyperlipidemia, unspecified: Secondary | ICD-10-CM | POA: Diagnosis present

## 2017-08-17 DIAGNOSIS — M5011 Cervical disc disorder with radiculopathy,  high cervical region: Secondary | ICD-10-CM | POA: Diagnosis present

## 2017-08-17 DIAGNOSIS — Z881 Allergy status to other antibiotic agents status: Secondary | ICD-10-CM

## 2017-08-17 DIAGNOSIS — M4322 Fusion of spine, cervical region: Secondary | ICD-10-CM | POA: Diagnosis not present

## 2017-08-17 DIAGNOSIS — J45909 Unspecified asthma, uncomplicated: Secondary | ICD-10-CM | POA: Diagnosis present

## 2017-08-17 DIAGNOSIS — Z96653 Presence of artificial knee joint, bilateral: Secondary | ICD-10-CM | POA: Diagnosis present

## 2017-08-17 DIAGNOSIS — E1151 Type 2 diabetes mellitus with diabetic peripheral angiopathy without gangrene: Secondary | ICD-10-CM | POA: Diagnosis not present

## 2017-08-17 DIAGNOSIS — Z8614 Personal history of Methicillin resistant Staphylococcus aureus infection: Secondary | ICD-10-CM

## 2017-08-17 DIAGNOSIS — Z87891 Personal history of nicotine dependence: Secondary | ICD-10-CM | POA: Diagnosis not present

## 2017-08-17 DIAGNOSIS — I10 Essential (primary) hypertension: Secondary | ICD-10-CM | POA: Diagnosis not present

## 2017-08-17 DIAGNOSIS — I872 Venous insufficiency (chronic) (peripheral): Secondary | ICD-10-CM | POA: Diagnosis not present

## 2017-08-17 DIAGNOSIS — Z9884 Bariatric surgery status: Secondary | ICD-10-CM | POA: Diagnosis not present

## 2017-08-17 DIAGNOSIS — Z882 Allergy status to sulfonamides status: Secondary | ICD-10-CM

## 2017-08-17 DIAGNOSIS — Z7984 Long term (current) use of oral hypoglycemic drugs: Secondary | ICD-10-CM | POA: Diagnosis not present

## 2017-08-17 DIAGNOSIS — I2510 Atherosclerotic heart disease of native coronary artery without angina pectoris: Secondary | ICD-10-CM | POA: Diagnosis present

## 2017-08-17 DIAGNOSIS — M2578 Osteophyte, vertebrae: Secondary | ICD-10-CM | POA: Diagnosis present

## 2017-08-17 DIAGNOSIS — Z79899 Other long term (current) drug therapy: Secondary | ICD-10-CM | POA: Diagnosis not present

## 2017-08-17 DIAGNOSIS — G4730 Sleep apnea, unspecified: Secondary | ICD-10-CM | POA: Diagnosis present

## 2017-08-17 DIAGNOSIS — M5412 Radiculopathy, cervical region: Secondary | ICD-10-CM | POA: Diagnosis present

## 2017-08-17 HISTORY — PX: ANTERIOR CERVICAL DECOMPRESSION/DISCECTOMY FUSION 4 LEVELS: SHX5556

## 2017-08-17 LAB — GLUCOSE, CAPILLARY
GLUCOSE-CAPILLARY: 153 mg/dL — AB (ref 65–99)
GLUCOSE-CAPILLARY: 155 mg/dL — AB (ref 65–99)
GLUCOSE-CAPILLARY: 269 mg/dL — AB (ref 65–99)
GLUCOSE-CAPILLARY: 226 mg/dL — AB (ref 65–99)
Glucose-Capillary: 122 mg/dL — ABNORMAL HIGH (ref 65–99)
Glucose-Capillary: 199 mg/dL — ABNORMAL HIGH (ref 65–99)

## 2017-08-17 LAB — ABO/RH: ABO/RH(D): A POS

## 2017-08-17 LAB — TYPE AND SCREEN
ABO/RH(D): A POS
Antibody Screen: NEGATIVE

## 2017-08-17 SURGERY — ANTERIOR CERVICAL DECOMPRESSION/DISCECTOMY FUSION 4 LEVELS
Anesthesia: General

## 2017-08-17 MED ORDER — MIDAZOLAM HCL 5 MG/5ML IJ SOLN
INTRAMUSCULAR | Status: DC | PRN
  Administered 2017-08-17: 2 mg via INTRAVENOUS

## 2017-08-17 MED ORDER — BISACODYL 10 MG RE SUPP: 10.0000 mg | Freq: Every day | RECTAL | Status: DC | PRN

## 2017-08-17 MED ORDER — LORATADINE 10 MG PO TABS
10.0000 mg | ORAL_TABLET | Freq: Every day | ORAL | Status: DC
  Administered 2017-08-18: 10 mg via ORAL
  Filled 2017-08-17: qty 1

## 2017-08-17 MED ORDER — LIDOCAINE-EPINEPHRINE 2 %-1:100000 IJ SOLN
INTRAMUSCULAR | Status: DC | PRN
  Administered 2017-08-17: 10 mL via INTRADERMAL

## 2017-08-17 MED ORDER — SODIUM CHLORIDE 0.9 % IV SOLN: INTRAVENOUS | Status: DC

## 2017-08-17 MED ORDER — SODIUM CHLORIDE 0.9% FLUSH: 3.0000 mL | Freq: Two times a day (BID) | INTRAVENOUS | Status: DC

## 2017-08-17 MED ORDER — THROMBIN 5000 UNITS EX SOLR
CUTANEOUS | Status: AC
  Filled 2017-08-17: qty 5000

## 2017-08-17 MED ORDER — FENTANYL CITRATE (PF) 100 MCG/2ML IJ SOLN
INTRAMUSCULAR | Status: DC | PRN
  Administered 2017-08-17 (×2): 50 ug via INTRAVENOUS
  Administered 2017-08-17: 100 ug via INTRAVENOUS
  Administered 2017-08-17: 50 ug via INTRAVENOUS

## 2017-08-17 MED ORDER — MENTHOL 3 MG MT LOZG: 1.0000 | LOZENGE | OROMUCOSAL | Status: DC | PRN

## 2017-08-17 MED ORDER — SUCCINYLCHOLINE CHLORIDE 200 MG/10ML IV SOSY
PREFILLED_SYRINGE | INTRAVENOUS | Status: AC
  Filled 2017-08-17: qty 10

## 2017-08-17 MED ORDER — CEFAZOLIN SODIUM-DEXTROSE 2-4 GM/100ML-% IV SOLN
2.0000 g | INTRAVENOUS | Status: AC
  Administered 2017-08-17: 2 g via INTRAVENOUS
  Filled 2017-08-17: qty 100

## 2017-08-17 MED ORDER — OXYCODONE HCL 5 MG PO TABS: 5.0000 mg | ORAL_TABLET | Freq: Once | ORAL | Status: DC | PRN

## 2017-08-17 MED ORDER — SUCCINYLCHOLINE CHLORIDE 20 MG/ML IJ SOLN
INTRAMUSCULAR | Status: DC | PRN
  Administered 2017-08-17: 120 mg via INTRAVENOUS

## 2017-08-17 MED ORDER — ACETAMINOPHEN 325 MG PO TABS: 650.0000 mg | ORAL_TABLET | ORAL | Status: DC | PRN

## 2017-08-17 MED ORDER — ZOLPIDEM TARTRATE 5 MG PO TABS: 5.0000 mg | ORAL_TABLET | Freq: Every evening | ORAL | Status: DC | PRN

## 2017-08-17 MED ORDER — LIDOCAINE HCL (CARDIAC) 20 MG/ML IV SOLN
INTRAVENOUS | Status: DC | PRN
  Administered 2017-08-17: 100 mg via INTRAVENOUS

## 2017-08-17 MED ORDER — LIDOCAINE-EPINEPHRINE 2 %-1:100000 IJ SOLN
INTRAMUSCULAR | Status: AC
  Filled 2017-08-17: qty 1

## 2017-08-17 MED ORDER — PHENYLEPHRINE HCL 10 MG/ML IJ SOLN
INTRAVENOUS | Status: DC | PRN
  Administered 2017-08-17: 25 ug/min via INTRAVENOUS

## 2017-08-17 MED ORDER — ONDANSETRON HCL 4 MG PO TABS: 4.0000 mg | ORAL_TABLET | Freq: Four times a day (QID) | ORAL | Status: DC | PRN

## 2017-08-17 MED ORDER — SUGAMMADEX SODIUM 200 MG/2ML IV SOLN
INTRAVENOUS | Status: AC
  Filled 2017-08-17: qty 2

## 2017-08-17 MED ORDER — ONDANSETRON HCL 4 MG/2ML IJ SOLN
INTRAMUSCULAR | Status: DC | PRN
  Administered 2017-08-17: 4 mg via INTRAVENOUS

## 2017-08-17 MED ORDER — ONDANSETRON HCL 4 MG/2ML IJ SOLN
INTRAMUSCULAR | Status: AC
  Filled 2017-08-17: qty 2

## 2017-08-17 MED ORDER — INSULIN ASPART 100 UNIT/ML ~~LOC~~ SOLN
0.0000 [IU] | Freq: Every day | SUBCUTANEOUS | Status: DC
  Administered 2017-08-17: 2 [IU] via SUBCUTANEOUS

## 2017-08-17 MED ORDER — MIDAZOLAM HCL 2 MG/2ML IJ SOLN
INTRAMUSCULAR | Status: AC
  Filled 2017-08-17: qty 2

## 2017-08-17 MED ORDER — VITAMIN D 1000 UNITS PO TABS
2000.0000 [IU] | ORAL_TABLET | Freq: Every day | ORAL | Status: DC
  Administered 2017-08-18: 2000 [IU] via ORAL
  Filled 2017-08-17: qty 2

## 2017-08-17 MED ORDER — DEXAMETHASONE SODIUM PHOSPHATE 4 MG/ML IJ SOLN
2.0000 mg | Freq: Four times a day (QID) | INTRAMUSCULAR | Status: AC
  Filled 2017-08-17: qty 1

## 2017-08-17 MED ORDER — DEXAMETHASONE SODIUM PHOSPHATE 10 MG/ML IJ SOLN
INTRAMUSCULAR | Status: DC | PRN
  Administered 2017-08-17: 4 mg via INTRAVENOUS
  Administered 2017-08-17: 6 mg via INTRAVENOUS

## 2017-08-17 MED ORDER — ACETAMINOPHEN 650 MG RE SUPP: 650.0000 mg | RECTAL | Status: DC | PRN

## 2017-08-17 MED ORDER — SENNA 8.6 MG PO TABS
1.0000 | ORAL_TABLET | Freq: Two times a day (BID) | ORAL | Status: DC
  Administered 2017-08-17 – 2017-08-18 (×3): 8.6 mg via ORAL
  Filled 2017-08-17 (×3): qty 1

## 2017-08-17 MED ORDER — PROPOFOL 10 MG/ML IV BOLUS
INTRAVENOUS | Status: AC
  Filled 2017-08-17: qty 20

## 2017-08-17 MED ORDER — ONDANSETRON HCL 4 MG/2ML IJ SOLN: 4.0000 mg | Freq: Four times a day (QID) | INTRAMUSCULAR | Status: DC | PRN

## 2017-08-17 MED ORDER — FENTANYL CITRATE (PF) 250 MCG/5ML IJ SOLN
INTRAMUSCULAR | Status: AC
  Filled 2017-08-17: qty 5

## 2017-08-17 MED ORDER — GABAPENTIN 300 MG PO CAPS
300.0000 mg | ORAL_CAPSULE | Freq: Three times a day (TID) | ORAL | Status: DC
  Administered 2017-08-17 – 2017-08-18 (×3): 300 mg via ORAL
  Filled 2017-08-17 (×3): qty 1

## 2017-08-17 MED ORDER — SODIUM CHLORIDE 0.9 % IR SOLN
Status: DC | PRN
  Administered 2017-08-17: 09:00:00

## 2017-08-17 MED ORDER — PANTOPRAZOLE SODIUM 40 MG IV SOLR: 40.0000 mg | Freq: Every day | INTRAVENOUS | Status: DC

## 2017-08-17 MED ORDER — OXYCODONE HCL ER 15 MG PO T12A
15.0000 mg | EXTENDED_RELEASE_TABLET | Freq: Two times a day (BID) | ORAL | Status: DC
  Administered 2017-08-17 – 2017-08-18 (×3): 15 mg via ORAL
  Filled 2017-08-17 (×3): qty 1

## 2017-08-17 MED ORDER — CEFAZOLIN SODIUM-DEXTROSE 1-4 GM/50ML-% IV SOLN
1.0000 g | Freq: Three times a day (TID) | INTRAVENOUS | Status: AC
  Administered 2017-08-17 (×2): 1 g via INTRAVENOUS
  Filled 2017-08-17 (×2): qty 50

## 2017-08-17 MED ORDER — INSULIN ASPART 100 UNIT/ML ~~LOC~~ SOLN
0.0000 [IU] | Freq: Three times a day (TID) | SUBCUTANEOUS | Status: DC
  Administered 2017-08-17: 8 [IU] via SUBCUTANEOUS
  Administered 2017-08-18: 5 [IU] via SUBCUTANEOUS

## 2017-08-17 MED ORDER — ACETAMINOPHEN 500 MG PO TABS
1000.0000 mg | ORAL_TABLET | Freq: Four times a day (QID) | ORAL | Status: DC
  Administered 2017-08-17 – 2017-08-18 (×4): 1000 mg via ORAL
  Filled 2017-08-17 (×4): qty 2

## 2017-08-17 MED ORDER — HYDROMORPHONE HCL 1 MG/ML IJ SOLN: 0.2500 mg | INTRAMUSCULAR | Status: DC | PRN

## 2017-08-17 MED ORDER — THROMBIN (RECOMBINANT) 5000 UNITS EX SOLR
OROMUCOSAL | Status: DC | PRN
  Administered 2017-08-17 (×2): via TOPICAL

## 2017-08-17 MED ORDER — DEXAMETHASONE SODIUM PHOSPHATE 10 MG/ML IJ SOLN
INTRAMUSCULAR | Status: AC
  Filled 2017-08-17: qty 1

## 2017-08-17 MED ORDER — PHENOL 1.4 % MT LIQD
1.0000 | OROMUCOSAL | Status: DC | PRN
  Administered 2017-08-17: 1 via OROMUCOSAL
  Filled 2017-08-17: qty 177

## 2017-08-17 MED ORDER — VALACYCLOVIR HCL 500 MG PO TABS
500.0000 mg | ORAL_TABLET | Freq: Every evening | ORAL | Status: DC
  Administered 2017-08-17: 500 mg via ORAL
  Filled 2017-08-17: qty 1

## 2017-08-17 MED ORDER — DOCUSATE SODIUM 100 MG PO CAPS
100.0000 mg | ORAL_CAPSULE | Freq: Two times a day (BID) | ORAL | Status: DC
  Administered 2017-08-17 – 2017-08-18 (×2): 100 mg via ORAL
  Filled 2017-08-17 (×2): qty 1

## 2017-08-17 MED ORDER — CHLORHEXIDINE GLUCONATE CLOTH 2 % EX PADS: 6.0000 | MEDICATED_PAD | Freq: Once | CUTANEOUS | Status: DC

## 2017-08-17 MED ORDER — DIAZEPAM 5 MG PO TABS: 5.0000 mg | ORAL_TABLET | Freq: Four times a day (QID) | ORAL | Status: DC | PRN

## 2017-08-17 MED ORDER — INSULIN ASPART 100 UNIT/ML ~~LOC~~ SOLN: 0.0000 [IU] | Freq: Three times a day (TID) | SUBCUTANEOUS | Status: DC

## 2017-08-17 MED ORDER — 0.9 % SODIUM CHLORIDE (POUR BTL) OPTIME
TOPICAL | Status: DC | PRN
  Administered 2017-08-17: 1000 mL

## 2017-08-17 MED ORDER — SODIUM CHLORIDE 0.9% FLUSH
3.0000 mL | INTRAVENOUS | Status: DC | PRN
Start: 2017-08-17 — End: 2017-08-17

## 2017-08-17 MED ORDER — METHOCARBAMOL 750 MG PO TABS
750.0000 mg | ORAL_TABLET | Freq: Four times a day (QID) | ORAL | Status: DC
  Administered 2017-08-17 – 2017-08-18 (×4): 750 mg via ORAL
  Filled 2017-08-17 (×4): qty 1

## 2017-08-17 MED ORDER — METFORMIN HCL 500 MG PO TABS
500.0000 mg | ORAL_TABLET | Freq: Every day | ORAL | Status: DC
  Administered 2017-08-17: 500 mg via ORAL
  Filled 2017-08-17: qty 1

## 2017-08-17 MED ORDER — SUGAMMADEX SODIUM 200 MG/2ML IV SOLN
INTRAVENOUS | Status: DC | PRN
  Administered 2017-08-17: 200 mg via INTRAVENOUS

## 2017-08-17 MED ORDER — OXYCODONE HCL 5 MG/5ML PO SOLN: 5.0000 mg | Freq: Once | ORAL | Status: DC | PRN

## 2017-08-17 MED ORDER — LACTATED RINGERS IV SOLN
INTRAVENOUS | Status: DC | PRN
  Administered 2017-08-17: 08:00:00 via INTRAVENOUS

## 2017-08-17 MED ORDER — DEXAMETHASONE 4 MG PO TABS
4.0000 mg | ORAL_TABLET | Freq: Four times a day (QID) | ORAL | Status: AC
  Administered 2017-08-17 (×3): 4 mg via ORAL
  Filled 2017-08-17 (×3): qty 1

## 2017-08-17 MED ORDER — ATORVASTATIN CALCIUM 20 MG PO TABS
40.0000 mg | ORAL_TABLET | Freq: Every evening | ORAL | Status: DC
  Administered 2017-08-17: 40 mg via ORAL
  Filled 2017-08-17: qty 2

## 2017-08-17 MED ORDER — LACTATED RINGERS IV SOLN
INTRAVENOUS | Status: DC | PRN
  Administered 2017-08-17 (×2): via INTRAVENOUS

## 2017-08-17 MED ORDER — THROMBIN 20000 UNITS EX SOLR
CUTANEOUS | Status: AC
  Filled 2017-08-17: qty 20000

## 2017-08-17 MED ORDER — DEXAMETHASONE SODIUM PHOSPHATE 4 MG/ML IJ SOLN: 2.0000 mg | Freq: Three times a day (TID) | INTRAMUSCULAR | Status: DC

## 2017-08-17 MED ORDER — ROCURONIUM BROMIDE 10 MG/ML (PF) SYRINGE
PREFILLED_SYRINGE | INTRAVENOUS | Status: AC
  Filled 2017-08-17: qty 5

## 2017-08-17 MED ORDER — LISINOPRIL 10 MG PO TABS
10.0000 mg | ORAL_TABLET | Freq: Every day | ORAL | Status: DC
  Administered 2017-08-17 – 2017-08-18 (×2): 10 mg via ORAL
  Filled 2017-08-17 (×2): qty 1

## 2017-08-17 MED ORDER — BUPIVACAINE-EPINEPHRINE (PF) 0.5% -1:200000 IJ SOLN
INTRAMUSCULAR | Status: AC
  Filled 2017-08-17: qty 30

## 2017-08-17 MED ORDER — FLEET ENEMA 7-19 GM/118ML RE ENEM: 1.0000 | ENEMA | Freq: Once | RECTAL | Status: DC | PRN

## 2017-08-17 MED ORDER — ROCURONIUM BROMIDE 100 MG/10ML IV SOLN
INTRAVENOUS | Status: DC | PRN
  Administered 2017-08-17: 50 mg via INTRAVENOUS
  Administered 2017-08-17 (×2): 20 mg via INTRAVENOUS

## 2017-08-17 MED ORDER — SODIUM CHLORIDE 0.9 % IV SOLN: 250.0000 mL | INTRAVENOUS | Status: DC

## 2017-08-17 MED ORDER — LIDOCAINE 2% (20 MG/ML) 5 ML SYRINGE
INTRAMUSCULAR | Status: AC
  Filled 2017-08-17: qty 5

## 2017-08-17 MED ORDER — OXYCODONE HCL 5 MG PO TABS
10.0000 mg | ORAL_TABLET | ORAL | Status: DC | PRN
  Filled 2017-08-17: qty 2

## 2017-08-17 MED ORDER — PROPOFOL 10 MG/ML IV BOLUS
INTRAVENOUS | Status: DC | PRN
  Administered 2017-08-17: 150 mg via INTRAVENOUS

## 2017-08-17 MED ORDER — CHLORHEXIDINE GLUCONATE CLOTH 2 % EX PADS
6.0000 | MEDICATED_PAD | Freq: Every day | CUTANEOUS | Status: DC
  Administered 2017-08-18: 6 via TOPICAL

## 2017-08-17 MED ORDER — MAGNESIUM 200 MG PO TABS
200.0000 mg | ORAL_TABLET | Freq: Every day | ORAL | Status: DC
  Administered 2017-08-17: 200 mg via ORAL
  Filled 2017-08-17 (×2): qty 1

## 2017-08-17 MED ORDER — OXYCODONE HCL 5 MG PO TABS: 5.0000 mg | ORAL_TABLET | ORAL | Status: DC | PRN

## 2017-08-17 MED ORDER — RISAQUAD PO CAPS
1.0000 | ORAL_CAPSULE | Freq: Every evening | ORAL | Status: DC
  Administered 2017-08-17: 1 via ORAL
  Filled 2017-08-17: qty 1

## 2017-08-17 MED ORDER — PANTOPRAZOLE SODIUM 40 MG PO TBEC
40.0000 mg | DELAYED_RELEASE_TABLET | Freq: Every day | ORAL | Status: DC
  Administered 2017-08-17: 40 mg via ORAL
  Filled 2017-08-17: qty 1

## 2017-08-17 SURGICAL SUPPLY — 80 items
ADH SKN CLS APL DERMABOND .7 (GAUZE/BANDAGES/DRESSINGS) ×1
BIT DRILL 14 (DRILL) IMPLANT
BONE EQUIVA 5CC (Bone Implant) ×1 IMPLANT
BUR MATCHSTICK NEURO 3.0 LAGG (BURR) ×2 IMPLANT
CANISTER SUCT 3000ML PPV (MISCELLANEOUS) ×2 IMPLANT
CARTRIDGE OIL MAESTRO DRILL (MISCELLANEOUS) ×1 IMPLANT
CHLORAPREP W/TINT 26ML (MISCELLANEOUS) ×2 IMPLANT
COVER LOCKING 5 (Miscellaneous) ×2 IMPLANT
COVER LOCKING 6 (Miscellaneous) ×2 IMPLANT
DECANTER SPIKE VIAL GLASS SM (MISCELLANEOUS) ×2 IMPLANT
DERMABOND ADVANCED (GAUZE/BANDAGES/DRESSINGS) ×1
DERMABOND ADVANCED .7 DNX12 (GAUZE/BANDAGES/DRESSINGS) ×1 IMPLANT
DIFFUSER DRILL AIR PNEUMATIC (MISCELLANEOUS) ×2 IMPLANT
DRAIN SNY WOU 7FLT (WOUND CARE) ×1 IMPLANT
DRAPE C-ARM 42X72 X-RAY (DRAPES) ×4 IMPLANT
DRAPE HALF SHEET 40X57 (DRAPES) ×1 IMPLANT
DRAPE LAPAROTOMY 100X72 PEDS (DRAPES) ×2 IMPLANT
DRAPE MICROSCOPE LEICA (MISCELLANEOUS) ×2 IMPLANT
DRAPE POUCH INSTRU U-SHP 10X18 (DRAPES) ×1 IMPLANT
DRAPE SHEET LG 3/4 BI-LAMINATE (DRAPES) ×1 IMPLANT
DRILL 14 (DRILL) ×2
DRILL BIT 14 MM ×1 IMPLANT
DRSG OPSITE POSTOP 3X4 (GAUZE/BANDAGES/DRESSINGS) ×1 IMPLANT
DRSG OPSITE POSTOP 4X6 (GAUZE/BANDAGES/DRESSINGS) ×1 IMPLANT
ELECT COATED BLADE 2.86 ST (ELECTRODE) ×2 IMPLANT
ELECT REM PT RETURN 9FT ADLT (ELECTROSURGICAL) ×2
ELECTRODE REM PT RTRN 9FT ADLT (ELECTROSURGICAL) ×1 IMPLANT
EVACUATOR 1/8 PVC DRAIN (DRAIN) IMPLANT
EVACUATOR SILICONE 100CC (DRAIN) ×1 IMPLANT
GAUZE SPONGE 4X4 12PLY STRL (GAUZE/BANDAGES/DRESSINGS) IMPLANT
GAUZE SPONGE 4X4 16PLY XRAY LF (GAUZE/BANDAGES/DRESSINGS) IMPLANT
GLOVE BIO SURGEON STRL SZ7 (GLOVE) IMPLANT
GLOVE BIOGEL PI IND STRL 7.0 (GLOVE) IMPLANT
GLOVE BIOGEL PI IND STRL 7.5 (GLOVE) ×1 IMPLANT
GLOVE BIOGEL PI INDICATOR 7.0 (GLOVE)
GLOVE BIOGEL PI INDICATOR 7.5 (GLOVE) ×1
GLOVE SS BIOGEL STRL SZ 7.5 (GLOVE) ×2 IMPLANT
GLOVE SUPERSENSE BIOGEL SZ 7.5 (GLOVE) ×2
GLOVE SURG SS PI 7.5 STRL IVOR (GLOVE) ×2 IMPLANT
GOWN STRL REUS W/ TWL LRG LVL3 (GOWN DISPOSABLE) ×1 IMPLANT
GOWN STRL REUS W/ TWL XL LVL3 (GOWN DISPOSABLE) ×1 IMPLANT
GOWN STRL REUS W/TWL LRG LVL3 (GOWN DISPOSABLE) ×2
GOWN STRL REUS W/TWL XL LVL3 (GOWN DISPOSABLE) ×2
HEMOSTAT POWDER KIT SURGIFOAM (HEMOSTASIS) ×3 IMPLANT
INTERBODY 16X14X5 7D PEEK STRL (Spacer) ×2 IMPLANT
INTERBODY 16X14X6 7D PEEK STRL (Spacer) ×2 IMPLANT
KIT BASIN OR (CUSTOM PROCEDURE TRAY) ×2 IMPLANT
KIT ROOM TURNOVER OR (KITS) ×2 IMPLANT
NDL HYPO 21X1.5 SAFETY (NEEDLE) ×1 IMPLANT
NDL SPNL 18GX3.5 QUINCKE PK (NEEDLE) ×1 IMPLANT
NEEDLE HYPO 21X1.5 SAFETY (NEEDLE) ×2 IMPLANT
NEEDLE SPNL 18GX3.5 QUINCKE PK (NEEDLE) ×2 IMPLANT
NS IRRIG 1000ML POUR BTL (IV SOLUTION) ×2 IMPLANT
OIL CARTRIDGE MAESTRO DRILL (MISCELLANEOUS) ×2
PACK LAMINECTOMY NEURO (CUSTOM PROCEDURE TRAY) ×2 IMPLANT
PAD ARMBOARD 7.5X6 YLW CONV (MISCELLANEOUS) ×3 IMPLANT
PATTIES SURGICAL .5X1.5 (GAUZE/BANDAGES/DRESSINGS) ×2 IMPLANT
PIN DISTRACTION 14MM (PIN) IMPLANT
PLATE ANTERIOR 4H 5 (Plate) ×2 IMPLANT
PLATE ANTERIOR 4H 6 (Plate) ×2 IMPLANT
RUBBERBAND STERILE (MISCELLANEOUS) ×4 IMPLANT
SCREW 3.5X16 FIX ANGLE S/T (Screw) ×12 IMPLANT
SCREW 3.5X18 FIX ANGLE S/T (Screw) ×4 IMPLANT
SPONGE INTESTINAL PEANUT (DISPOSABLE) ×2 IMPLANT
SPONGE SURGIFOAM ABS GEL 100 (HEMOSTASIS) IMPLANT
STAPLER VISISTAT 35W (STAPLE) ×2 IMPLANT
STOCKINETTE 6  STRL (DRAPES) ×1
STOCKINETTE 6 STRL (DRAPES) ×1 IMPLANT
SUT STRATAFIX MNCRL+ 3-0 PS-2 (SUTURE) ×1
SUT STRATAFIX MONOCRYL 3-0 (SUTURE) ×1
SUT VIC AB 3-0 SH 8-18 (SUTURE) ×2 IMPLANT
SUTURE STRATFX MNCRL+ 3-0 PS-2 (SUTURE) IMPLANT
SYR 30ML LL (SYRINGE) ×2 IMPLANT
TAPE SURG TRANSPORE 1 IN (GAUZE/BANDAGES/DRESSINGS) ×1 IMPLANT
TAPE SURGICAL TRANSPORE 1 IN (GAUZE/BANDAGES/DRESSINGS) ×1
TOWEL GREEN STERILE (TOWEL DISPOSABLE) ×2 IMPLANT
TOWEL GREEN STERILE FF (TOWEL DISPOSABLE) ×4 IMPLANT
TRAY FOLEY CATH SILVER 16FR (SET/KITS/TRAYS/PACK) ×2 IMPLANT
TUBE CONNECTING 12X1/4 (SUCTIONS) ×2 IMPLANT
WATER STERILE IRR 1000ML POUR (IV SOLUTION) ×2 IMPLANT

## 2017-08-17 NOTE — Anesthesia Postprocedure Evaluation (Signed)
Anesthesia Post Note  Patient: Andrew Cobb  Procedure(s) Performed: Anterior discectomy with fusion and plate fixation Cervical Three-Four, Four-Five, Five-Six, and Six-Seven Fusion (N/A )     Patient location during evaluation: PACU Anesthesia Type: General Level of consciousness: awake and sedated Pain management: pain level controlled Vital Signs Assessment: post-procedure vital signs reviewed and stable Respiratory status: spontaneous breathing, nonlabored ventilation, respiratory function stable and patient connected to nasal cannula oxygen Cardiovascular status: blood pressure returned to baseline and stable Postop Assessment: no apparent nausea or vomiting Anesthetic complications: no    Last Vitals:  Vitals:   08/17/17 1202 08/17/17 1210  BP: (!) 120/105   Pulse: 68   Resp: (!) 21   Temp:  36.7 C  SpO2: 94%     Last Pain:  Vitals:   08/17/17 1210  TempSrc:   PainSc: 0-No pain                 Leonila Speranza,JAMES TERRILL

## 2017-08-17 NOTE — H&P (Signed)
CC:  No chief complaint on file. Neck and arm pain  HPI: Andrew Cobb is a 67 y.o. male with cervical spondylosis with radiculopathy who presents for elective C3-7 ACDF.  He denies any changes since I saw him in clinic.  PMH: Past Medical History:  Diagnosis Date  . Arthritis   . Asthma   . Cancer [facility])    prostate  . Chronic venous insufficiency    varicose vein lower extremity with inflammation  . Coronary artery disease 1996   two stents placed   . Diabetes mellitus without complication ([place])    type 2 on metformin  . GERD (gastroesophageal reflux disease)    no issues since gastric bypass surgery as stated per pt  . Hyperlipidemia   . Hypogonadism in male   . MRSA (methicillin resistant Staphylococcus aureus) infection    07/30/2008 thru 08/07/2008  . Sleep apnea    on BIPAP  . Stented coronary artery   . Thrombocythemia ([place])     PSH: Past Surgical History:  Procedure Laterality Date  . ANGIOPLASTY    . ANGIOPLASTY     with stent 04/07/1995  . APPENDECTOMY     1966  . CARDIAC CATHETERIZATION  1996  . CARDIOVASCULAR STRESS TEST     07/31/2011  . CARPAL TUNNEL RELEASE Left   . CHOLECYSTECTOMY     2006  . colonscopy      08/25/2012  . FUNCTIONAL ENDOSCOPIC SINUS SURGERY     11/10/2013  . GASTRIC BYPASS     10/05/2012  . HERNIA REPAIR     left inguinal 1981  . INCISION AND DRAINAGE ABSCESS Right 02/26/2017   Procedure: INCISION AND DRAINAGE ABSCESS;  Surgeon: Nickie Retort, MD;  Location: [facility] ORS;  Service: Urology;  Laterality: Right;  . JOINT REPLACEMENT     bilateral  . left ankle surgery      05/03/2003   . left carpel tunnel      09/18/1993  . left knee meniscal tear      01/25/2010  . left knee meniscal tear repair      05/04/1996  . left rotator cuff repair      05/03/2003   . REPLACEMENT TOTAL KNEE BILATERAL  07/13/2015  . right ankle surgery      fracture has 2 screws 07/07/1997  . right carpel tunnel      05/16/1992  . right shoulder  replacement      01/27/2006  . SCROTAL EXPLORATION Right 02/26/2017   Procedure: SCROTUM EXPLORATION;  Surgeon: Nickie Retort, MD;  Location: [facility] ORS;  Service: Urology;  Laterality: Right;  . TOTAL KNEE ARTHROPLASTY Left 07/13/2015   Procedure: LEFT TOTAL KNEE ARTHROPLASTY;  Surgeon: Gaynelle Arabian, MD;  Location: [facility] ORS;  Service: Orthopedics;  Laterality: Left;    SH: Social History   Tobacco Use  . Smoking status: Former Smoker    Packs/day: 1.00    Years: 10.00    Pack years: 10.00    Types: Cigarettes    Last attempt to quit: 07/07/1984    Years since quitting: 33.1  . Smokeless tobacco: Never Used  . Tobacco comment: quit 1986  Substance Use Topics  . Alcohol use: No    Alcohol/week: 0.0 oz  . Drug use: No    MEDS: Prior to Admission medications   Medication Sig Start Date End Date Taking? Authorizing Provider  atorvastatin (LIPITOR) 40 MG tablet Take 1 tablet (40 mg total) by mouth daily. Patient taking differently: Take  40 mg by mouth every evening.  07/23/17  Yes Crissman, Jeannette How, MD  cetirizine (ZYRTEC) 10 MG tablet Take 10 mg by mouth daily.   Yes [provider]  Cholecalciferol (VITAMIN D) 2000 units CAPS Take 2,000 Units by mouth daily.   Yes [provider]  glucose blood test strip 1 each by Other route 2 (two) times daily. DX E11.9 08/12/17  Yes Crissman, Jeannette How, MD  Lancets (ONETOUCH ULTRASOFT) lancets 1 each by Other route 2 (two) times daily. Dx E11.9 04/24/15  Yes Arlis Porta., MD  lisinopril (PRINIVIL,ZESTRIL) 10 MG tablet Take 1 tablet (10 mg total) by mouth daily. 07/21/17  Yes Crissman, Jeannette How, MD  Magnesium 250 MG TABS Take 250 mg by mouth at bedtime.    Yes [provider]  metFORMIN (GLUCOPHAGE) 500 MG tablet Take 1 tablet (500 mg total) by mouth daily with breakfast. Once daily Patient taking differently: Take 500 mg by mouth every evening. Once daily 07/21/17  Yes Crissman, Jeannette How, MD  Multiple Vitamins-Minerals  (MULTIVITAMIN PO) Take 1 tablet by mouth daily.    Yes [provider]  Probiotic CAPS Take 1 capsule by mouth every evening.   Yes [provider]  valACYclovir (VALTREX) 500 MG tablet Take 1 tablet (500 mg total) by mouth daily. Patient taking differently: Take 500 mg by mouth every evening.  07/21/17  Yes Crissman, Jeannette How, MD  Homeopathic Products [facility] COLD REMEDY NA) Place 1 spray into the nose daily as needed (congestion).    [provider]    ALLERGY: Allergies  Allergen Reactions  . Sulfamethoxazole-Trimethoprim Rash  . Tetracyclines & Related Rash    ROS: ROS  NEUROLOGIC EXAM: Awake, alert, oriented Memory and concentration grossly intact Speech fluent, appropriate CN grossly intact Motor exam: Upper Extremities Deltoid Bicep Tricep Grip  Right 5/5 5/5 5/5 5/5  Left 5/5 5/5 5/5 5/5   Lower Extremity IP Quad PF DF EHL  Right 5/5 5/5 5/5 5/5 5/5  Left 5/5 5/5 5/5 5/5 5/5   Sensation grossly intact to LT  IMAGING: No new imaging  IMPRESSION: - 67 y.o. male with cervical spondylosis with radiculopathy.  Neuro intact.  PLAN: - C3-7 anterior discectomy with fusion and plate fixation - We have discussed the risks, benefits, and alternatives and he wishes to proceed.

## 2017-08-17 NOTE — Transfer of Care (Signed)
Immediate Anesthesia Transfer of Care Note  Patient: Andrew Cobb  Procedure(s) Performed: Anterior discectomy with fusion and plate fixation Cervical Three-Four, Four-Five, Five-Six, and Six-Seven Fusion (N/A )  Patient Location: PACU  Anesthesia Type:General  Level of Consciousness: awake, alert  and oriented  Airway & Oxygen Therapy: Patient Spontanous Breathing and Patient connected to nasal cannula oxygen  Post-op Assessment: Report given to RN, Post -op Vital signs reviewed and stable and Patient moving all extremities X 4  Post vital signs: Reviewed and stable  Last Vitals:  Vitals:   08/17/17 0623  BP: (!) 175/92  Pulse: (!) 56  Resp: 20  Temp: 36.4 C  SpO2: 99%    Last Pain:  Vitals:   08/17/17 0623  TempSrc: Oral  PainSc:          Complications: No apparent anesthesia complications

## 2017-08-17 NOTE — Anesthesia Procedure Notes (Addendum)
Procedure Name: Intubation Date/Time: 08/17/2017 7:38 AM Performed by: Neldon Newport, CRNA Pre-anesthesia Checklist: Patient identified, Emergency Drugs available, Suction available and Patient being monitored Patient Re-evaluated:Patient Re-evaluated prior to induction Oxygen Delivery Method: Circle System Utilized Preoxygenation: Pre-oxygenation with 100% oxygen Induction Type: IV induction Ventilation: Oral airway inserted - appropriate to patient size and Mask ventilation without difficulty Laryngoscope Size: Mac and 4 Grade View: Grade I Tube type: Oral Tube size: 7.5 mm Number of attempts: 1 Airway Equipment and Method: Stylet and Oral airway Placement Confirmation: ETT inserted through vocal cords under direct vision,  positive ETCO2 and breath sounds checked- equal and bilateral Tube secured with: Tape Dental Injury: Teeth and Oropharynx as per pre-operative assessment

## 2017-08-17 NOTE — Brief Op Note (Signed)
08/17/2017  11:06 AM  PATIENT:  Andrew Cobb  67 y.o. male  PRE-OPERATIVE DIAGNOSIS:  Cervical spondylosis with radiculopathy  POST-OPERATIVE DIAGNOSIS:  Cervical spondylosis with radiculopathy  PROCEDURE:  Procedure(s) with comments: Anterior discectomy with fusion and plate fixation Cervical Three-Four, Four-Five, Five-Six, and Six-Seven Fusion (N/A) - Anterior discectomy with fusion and plate fixation Cervical Three-Four, Four-Five, Five-Six, and Six-Seven Fusion   SURGEON:  Surgeon(s) and Role:    * Jaidyn Usery, Kevan Ny, MD - Primary    * Kary Kos, MD - Assisting  PHYSICIAN ASSISTANT:   ASSISTANTS:  ANESTHESIA:   general  EBL:  200 mL   BLOOD ADMINISTERED:none  DRAINS: JP  LOCAL MEDICATIONS USED:  MARCAINE    and LIDOCAINE   SPECIMEN:  No Specimen  DISPOSITION OF SPECIMEN:  N/A  COUNTS:  YES  TOURNIQUET:  * No tourniquets in log *  DICTATION: .Dragon Dictation  PLAN OF CARE: Admit to inpatient   PATIENT DISPOSITION:  PACU - hemodynamically stable.   Delay start of Pharmacological VTE agent (>24hrs) due to surgical blood loss or risk of bleeding: yes

## 2017-08-17 NOTE — Evaluation (Signed)
Physical Therapy Evaluation Patient Details Name: Andrew Cobb MRN: [id number] DOB: [birth year] Today's Date: 08/17/2017   History of Present Illness  Patient is a 67 y/o male who presents s/p C3-7 ACDF. PMH includes bil TKA, Rt TSA and gastric bypass, DM, CAD.   Clinical Impression  Patient tolerated bed mobility, transfers and gait training with supervision for safety. Education re: cervical colar, cervical precautions, mobility expectations, log roll technique etc. Pt independent PTA, active and lives with wife who can support him at home at d/c. Encouraged mobility while in hospital. Pt does not require skilled therapy services as pt functioning close to baseline. No deficits or symptoms present today including pain. All education completed. Discharge from therapy.    Follow Up Recommendations No PT follow up;Supervision - Intermittent    Equipment Recommendations  None recommended by PT    Recommendations for Other Services       Precautions / Restrictions Precautions Precautions: Cervical Required Braces or Orthoses: Cervical Brace Cervical Brace: Hard collar Restrictions Weight Bearing Restrictions: No      Mobility  Bed Mobility Overal bed mobility: Needs Assistance Bed Mobility: Rolling;Sidelying to Sit;Sit to Sidelying Rolling: Supervision Sidelying to sit: Supervision     Sit to sidelying: Supervision General bed mobility comments: HOB flat, no use of rails to simulate home. Needs cues for log roll technique. No assist needed.   Transfers Overall transfer level: Modified independent Equipment used: None             General transfer comment: Stood from EOB x2 without difficulty. Transferred to chair post ambulation.  Ambulation/Gait Ambulation/Gait assistance: Supervision Ambulation Distance (Feet): 150 Feet Assistive device: None Gait Pattern/deviations: Step-through pattern;Decreased stride length Gait velocity: decreased   General Gait Details:  Slow, steady gait- was up walking with RN in hallway, took over. Reports feeling a little woozy but otherwise steady.   Stairs            Wheelchair Mobility    Modified [name] (Stroke Patients Only)       Balance Overall balance assessment: Needs assistance Sitting-balance support: Feet supported;No upper extremity supported Sitting balance-Leahy Scale: Good     Standing balance support: During functional activity Standing balance-Leahy Scale: Fair Standing balance comment: Able to reach outside BoS and grab things on tray without difficulty.                              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 Works out at [employer] everyday when he is not traveling in the pool and loves to walk.     Hand Dominance   Dominant Hand: Left    Extremity/Trunk Assessment   Upper Extremity Assessment Upper Extremity Assessment: Defer to OT evaluation;RUE deficits/detail RUE Deficits / Details: Limited shoulder elevation secondary to TSA; good grip strength RUE Sensation: [facility])    Lower Extremity Assessment Lower Extremity Assessment: Overall WFL for tasks assessed    Cervical / Trunk  Assessment Cervical / Trunk Assessment: Other exceptions Cervical / Trunk Exceptions: s/p cervical spine surgery  Communication   Communication: No difficulties  Cognition Arousal/Alertness: Awake/alert Behavior During Therapy: WFL for tasks  assessed/performed Overall Cognitive Status: Within Functional Limits for tasks assessed                                        General Comments      Exercises     Assessment/Plan    PT Assessment Patent does not need any further PT services  PT Problem List         PT Treatment Interventions      PT Goals (Current goals can be found in the Care Plan section)  Acute Rehab PT Goals Patient Stated Goal: to return home PT Goal Formulation: All assessment and education complete, DC therapy    Frequency     Barriers to discharge        Co-evaluation               AM-PAC PT "6 Clicks" Daily Activity  Outcome Measure Difficulty turning over in bed (including adjusting bedclothes, sheets and blankets)?: None Difficulty moving from lying on back to sitting on the side of the bed? : None Difficulty sitting down on and standing up from a chair with arms (e.g., wheelchair, bedside commode, etc,.)?: None Help needed moving to and from a bed to chair (including a wheelchair)?: None Help needed walking in hospital room?: A Little Help needed climbing 3-5 steps with a railing? : A Little 6 Click Score: 22    End of Session Equipment Utilized During Treatment: Cervical collar Activity Tolerance: Patient tolerated treatment well Patient left: in chair;with call bell/phone within reach Nurse Communication: Mobility status PT Visit Diagnosis: Difficulty in walking, not elsewhere classified (R26.2)    Time: 3220-2542 PT Time Calculation (min) (ACUTE ONLY): 24 min   Charges:   PT Evaluation $PT Eval Low Complexity: 1 Low     PT G CodesWray Kearns, PT, DPT [phone]    Marguarite Arbour A Chinwe Lope 08/17/2017, 3:43 PM

## 2017-08-18 LAB — GLUCOSE, CAPILLARY: GLUCOSE-CAPILLARY: 225 mg/dL — AB (ref 65–99)

## 2017-08-18 MED ORDER — METHOCARBAMOL 750 MG PO TABS: 750.0000 mg | ORAL_TABLET | Freq: Four times a day (QID) | ORAL | 2 refills | Status: DC

## 2017-08-18 MED ORDER — HYDROCODONE-ACETAMINOPHEN 5-325 MG PO TABS: 1.0000 | ORAL_TABLET | ORAL | 0 refills | Status: DC | PRN

## 2017-08-18 MED ORDER — GABAPENTIN 100 MG PO CAPS: 300.0000 mg | ORAL_CAPSULE | Freq: Three times a day (TID) | ORAL | 2 refills | Status: DC

## 2017-08-18 MED FILL — Thrombin For Soln 5000 Unit: CUTANEOUS | Qty: 5000 | Status: AC

## 2017-08-18 NOTE — Progress Notes (Signed)
Patient alert and oriented, mae's well, voiding adequate amount of urine, swallowing without difficulty, no c/o pain at time of discharge. Patient discharged home with family. Script and discharged instructions given to patient. Patient and family stated understanding of instructions given. Patient has an appointment with Dr. Ditty  

## 2017-08-18 NOTE — Evaluation (Signed)
Occupational Therapy Evaluation Patient Details Name: Andrew Cobb MRN: [id number] DOB: [birth year] Today's Date: 08/18/2017    History of Present Illness Patient is a 67 y/o male who presents s/p C3-7 ACDF. PMH includes bil TKA, Rt TSA and gastric bypass, DM, CAD.    Clinical Impression   Patient evaluated by Occupational Therapy with no further acute OT needs identified. All education has been completed and the patient has no further questions. See below for any follow-up Occupational Therapy or equipment needs. OT to sign off. Thank you for referral.      Follow Up Recommendations  No OT follow up    Equipment Recommendations  None recommended by OT    Recommendations for Other Services       Precautions / Restrictions Precautions Precautions: Cervical Required Braces or Orthoses: Cervical Brace Cervical Brace: Hard collar      Mobility Bed Mobility Overal bed mobility: Independent                Transfers Overall transfer level: Independent                    Balance                                           ADL either performed or assessed with clinical judgement   ADL Overall ADL's : Modified independent                                       General ADL Comments: completed dressing during session, educated on don doff brace with demonstration in bathroom, education on bathing around surgery site, use of clean fresh linen. report full sensation in hands / arms, pt states "i feel good!"     Vision Baseline Vision/History: Wears glasses       Perception     Praxis      Pertinent Vitals/Pain Pain Assessment: No/denies pain     Hand Dominance Left   Extremity/Trunk Assessment Upper Extremity Assessment Upper Extremity Assessment: RUE deficits/detail RUE Deficits / Details: AROM 100 degrees with previous TSA   Lower Extremity Assessment Lower Extremity Assessment: Overall WFL for tasks assessed    Cervical / Trunk Assessment Cervical / Trunk Assessment: Other exceptions Cervical / Trunk Exceptions: s/p surgery   Communication Communication Communication: No difficulties   Cognition Arousal/Alertness: Awake/alert Behavior During Therapy: WFL for tasks assessed/performed Overall Cognitive Status: Within Functional Limits for tasks assessed                                     General Comments  dressing dry intact and pt reports "she pulled that drain this morning"    Exercises     Shoulder Instructions      Home Living Family/patient expects to be discharged to:: Private residence Living Arrangements: Spouse/significant other Available Help at Discharge: Family;Available 24 hours/day Type of Home: House Home Access: Stairs to enter [employer] of Steps: 1   Home Layout: One level     Bathroom Shower/Tub: Occupational psychologist: Handicapped height     Home Equipment: Environmental consultant - 2 wheels;Shower seat   Additional Comments: will have wife (A) upon d/c  Prior Functioning/Environment Level of Independence: Independent        Comments: Works as [name] during the holidays at [employer]; also works picking up/dropping off cars. Works out at [employer] everyday when he is not traveling in the pool and loves to walk.        OT Problem List:        OT Treatment/Interventions:      OT Goals(Current goals can be found in the care plan section) Acute Rehab OT Goals Patient Stated Goal: to return home  OT Frequency:     Barriers to D/C:            Co-evaluation              AM-PAC PT "6 Clicks" Daily Activity     Outcome Measure Help from another person eating meals?: None Help from another person taking care of personal grooming?: None Help from another person toileting, which includes using toliet, bedpan, or urinal?: None Help from another person bathing (including washing, rinsing, drying)?: None Help from  another person to put on and taking off regular upper body clothing?: None Help from another person to put on and taking off regular lower body clothing?: None 6 Click Score: 24   End of Session Equipment Utilized During Treatment: Cervical collar  Activity Tolerance: Patient tolerated treatment well Patient left: Other (comment)(walking in the hall)                   Time: [phone] OT Time Calculation (min): 30 min Charges:  OT General Charges $OT Visit: 1 Visit OT Evaluation $OT Eval Low Complexity: 1 Low G-CodesJeri Cobb   OTR/L Pager: [phone] Office: [phone] .   Andrew Cobb B 08/18/2017, 9:11 AM

## 2017-08-18 NOTE — Op Note (Signed)
08/17/2017  1:31 PM  PATIENT:  Andrew Cobb  67 y.o. male  PRE-OPERATIVE DIAGNOSIS:  Cervical spondylosis with radiculopathy  POST-OPERATIVE DIAGNOSIS:  Same  PROCEDURE:  C3-7 anterior discectomy with fusion and plate fixation  SURGEON:  Aldean Ast, MD  ASSISTANTS: Kary Kos, MD  ANESTHESIA:   General  DRAINS: JP   SPECIMEN:  None  INDICATION FOR PROCEDURE: 67 year old man with cervical spondylosis and radiculopathy.  He did not experience adequate improvement with conservative management.  I recommended the above procedure. Patient understood the risks, benefits, and alternatives and potential outcomes and wished to proceed.  PROCEDURE DETAILS: Patient was brought to the operating room placed under general endotracheal anesthesia. Patient was placed in the supine position on the operating room table. The neck was prepped with betadine and chloraprep and draped in a sterile fashion.   A transverse incision was made on the right side of the neck. Dissection was carried down thru the subcutaneous tissue and the platysma was elevated, opened vertically, and undermined with Metzenbaum scissors. Dissection was then carried out thru an avascular plane leaving the sternocleidomastoid, carotid artery, and jugular vein laterally and the trachea and esophagus medially. The ventral aspect of the vertebral column was identified and a localizing x-ray was taken. The C3-4 level was identified. The longus colli muscles were then elevated and the retractor was placed. The annulus was incised and the disc space entered. Discectomy was performed with micro-curettes and pituitary and kerrison rongeurs. I then used the high-speed drill to drill the endplates down to the level of the posterior longitudinal ligament. The operating microscope was draped and brought into the field provided additional magnification, illumination and visualization. Utilizing microsurgical technique, discectomy was  continued posteriorly thru the disc space. Posterior longitudinal ligament was opened with a nerve hook, and then removed along with disc herniation and osteophytes, decompressing the spinal canal and thecal sac. We then continued to remove osteophytic overgrowth and disc material decompressing the neural foramina and exiting nerve roots bilaterally. So by both visualization and palpation we felt we had an adequate decompression of the neural elements. We then measured the height of the intravertebral disc space and selected a Peek interbody cage packed with equivabone and an appropriate size plate. These were inserted fixed angle screws were placed through the plate into the vertebral body above and below. The locking screw was inserted and torqued off.  The superior distraction pin was then removed and bone bleeding was stopped with bone wax. The distraction pin was inserted at the next inferior level. The annulus was incised and the disc space entered. Discectomy was performed with micro-curettes and pituitary and kerrison rongeurs. I then used the high-speed drill to drill the endplates down to the level of the posterior longitudinal ligament. The operating microscope was returned to the field.  The discectomy was continued posteriorly thru the disc space. Posterior longitudinal ligament was opened with a nerve hook, and then removed along with disc herniation and osteophytes, decompressing the spinal canal and thecal sac. We then continued to remove osteophytic overgrowth and disc material decompressing the neural foramina and exiting nerve roots bilaterally. So by both visualization and palpation we felt we had an adequate decompression of the neural elements. We then measured the height of the intravertebral disc space and selected a second Peek interbody cage which was packed with equivabone.  The implant and plate were inserted similarly to the C3-4 level.  The superior distraction pin was then  removed  and bone bleeding was stopped with bone wax. The distraction pin was inserted at the next inferior level. The annulus was incised and the disc space entered. Discectomy was performed with micro-curettes and pituitary and kerrison rongeurs. I then used the high-speed drill to drill the endplates down to the level of the posterior longitudinal ligament. The operating microscope was returned to the field.  The discectomy was continued posteriorly thru the disc space. Posterior longitudinal ligament was opened with a nerve hook, and then removed along with disc herniation and osteophytes, decompressing the spinal canal and thecal sac. We then continued to remove osteophytic overgrowth and disc material decompressing the neural foramina and exiting nerve roots bilaterally. So by both visualization and palpation we felt we had an adequate decompression of the neural elements. A PEEK spacer and plate were inserted and the plate was fixated as described at the prior two levels.  The superior distraction pin was then again removed and bone bleeding was stopped with bone wax. The distraction pin was inserted at the most inferior level. The annulus was incised and the disc space entered. Discectomy was performed with micro-curettes and pituitary and kerrison rongeurs. I then used the high-speed drill to drill the endplates down to the level of the posterior longitudinal ligament. The operating microscope was returned to the field.  The discectomy was continued posteriorly thru the disc space. Posterior longitudinal ligament was opened with a nerve hook, and then removed along with disc herniation and osteophytes, decompressing the spinal canal and thecal sac. We then continued to remove osteophytic overgrowth and disc material decompressing the neural foramina and exiting nerve roots bilaterally. So by both visualization and palpation we felt we had an adequate decompression of the neural elements. A PEEK spacer  and plate were inserted and the plate was fixated as described at the prior three levels.  The wound was irrigated with bacitracin solution, checked for hemostasis which was established and confirmed. Once meticulous hemostasis was achieved a JP drain was placed. The platysma was closed with interrupted 3-0 undyed Vicryl suture, the subcuticular layer was closed with running undyed Vicryl suture. The skin edges were approximated with dermabond. The drapes were removed. A sterile dressing was applied. The patient was then awakened from general anesthesia and transferred to the recovery room in stable condition. At the end of the procedure all sponge, needle and instrument counts were correct.   PATIENT DISPOSITION:  PACU - hemodynamically stable.   Delay start of Pharmacological VTE agent (>24hrs) due to surgical blood loss or risk of bleeding:  yes

## 2017-08-18 NOTE — Discharge Summary (Signed)
Date of Admission: 08/17/2017  Date of Discharge: 08/18/17  Admission Diagnosis: Cervical spondylosis with radiculopathy  Discharge Diagnosis: Same  Procedure Performed: C3-7 anterior discectomy with fusion and plate fixation  Attending: Mikita Lesmeister, Kevan Ny, MD  Hospital Course:  The patient was admitted for the above listed operation and had an uncomplicated post-operative course.  They were discharged in stable condition.  Follow up: 3 weeks  Allergies as of 08/18/2017      Reactions   Sulfamethoxazole-trimethoprim Rash   Tetracyclines & Related Rash      Medication List    TAKE these medications   atorvastatin 40 MG tablet Commonly known as:  LIPITOR Take 1 tablet (40 mg total) by mouth daily. What changed:  when to take this   cetirizine 10 MG tablet Commonly known as:  ZYRTEC Take 10 mg by mouth daily.   gabapentin 100 MG capsule Commonly known as:  NEURONTIN Take 3 capsules (300 mg total) by mouth 3 (three) times daily.   glucose blood test strip 1 each by Other route 2 (two) times daily. DX E11.9   HYDROcodone-acetaminophen 5-325 MG tablet Commonly known as:  NORCO Take 1-2 tablets by mouth every 4 (four) hours as needed for moderate pain.   lisinopril 10 MG tablet Commonly known as:  PRINIVIL,ZESTRIL Take 1 tablet (10 mg total) by mouth daily.   Magnesium 250 MG Tabs Take 250 mg by mouth at bedtime.   metFORMIN 500 MG tablet Commonly known as:  GLUCOPHAGE Take 1 tablet (500 mg total) by mouth daily with breakfast. Once daily What changed:    when to take this  additional instructions   methocarbamol 750 MG tablet Commonly known as:  ROBAXIN Take 1 tablet (750 mg total) by mouth 4 (four) times daily.   MULTIVITAMIN PO Take 1 tablet by mouth daily.   onetouch ultrasoft lancets 1 each by Other route 2 (two) times daily. Dx E11.9   Probiotic Caps Take 1 capsule by mouth every evening.   valACYclovir 500 MG tablet Commonly known as:   VALTREX Take 1 tablet (500 mg total) by mouth daily. What changed:  when to take this   Vitamin D 2000 units Caps Take 2,000 Units by mouth daily.   ZICAM COLD REMEDY NA Place 1 spray into the nose daily as needed (congestion).

## 2017-08-21 ENCOUNTER — Encounter (HOSPITAL_COMMUNITY): Payer: Self-pay | Admitting: Neurological Surgery

## 2017-08-31 ENCOUNTER — Encounter (HOSPITAL_COMMUNITY): Payer: Self-pay | Admitting: Neurological Surgery

## 2017-09-02 DIAGNOSIS — G4733 Obstructive sleep apnea (adult) (pediatric): Secondary | ICD-10-CM | POA: Diagnosis not present

## 2017-09-04 ENCOUNTER — Encounter (HOSPITAL_COMMUNITY): Payer: Self-pay | Admitting: Neurological Surgery

## 2017-10-06 DIAGNOSIS — M542 Cervicalgia: Secondary | ICD-10-CM | POA: Diagnosis not present

## 2017-10-08 ENCOUNTER — Ambulatory Visit: Payer: Self-pay | Admitting: Podiatry

## 2017-10-12 ENCOUNTER — Encounter: Payer: Self-pay | Admitting: Podiatry

## 2017-10-12 ENCOUNTER — Ambulatory Visit (INDEPENDENT_AMBULATORY_CARE_PROVIDER_SITE_OTHER): Payer: PPO | Admitting: Podiatry

## 2017-10-12 VITALS — BP 157/80 | HR 63

## 2017-10-12 DIAGNOSIS — L03031 Cellulitis of right toe: Secondary | ICD-10-CM

## 2017-10-12 NOTE — Progress Notes (Signed)
This patient presents to the office for an evaluation of his big toenail right big toe.  He says that he had this nail surgically removed 15 years ago.  He says fragments of nail frequently return and he was working on the fragment of nail and the toe became infected.  He says that over the weekend. The toe has improved and he is not having any pain or discomfort today. This patient is diabetic.  He presents the office today for an evaluation of his previously infected big toenail right foot.   General Appearance  Alert, conversant and in no acute stress.  Vascular  Dorsalis pedis and posterior tibial  pulses are palpable  bilaterally.  Capillary return is within normal limits  bilaterally. Temperature is within normal limits  bilaterally.  Neurologic  Senn-Weinstein monofilament wire test within normal limits  bilaterally. Muscle power within normal limits bilaterally.  Nails Thick disfigured discolored nails with subungual debris  from second to fifth toes bilaterally. No evidence of bacterial infection or drainage bilaterally. Nail fragment both hallux nails with previous infection right hallux. Infection has resolved.  Orthopedic  No limitations of motion of motion feet .  No crepitus or effusions noted.  No bony pathology or digital deformities noted.  Skin  normotropic skin with no porokeratosis noted bilaterally.  No signs of infections or ulcers noted.    Paronychia right hallux.  IE  Infection has resolved.  No evidence of any redness or swelling or drainage.  RTC prn   Gardiner Barefoot DPM

## 2017-11-17 DIAGNOSIS — M542 Cervicalgia: Secondary | ICD-10-CM | POA: Diagnosis not present

## 2017-12-17 DIAGNOSIS — H04123 Dry eye syndrome of bilateral lacrimal glands: Secondary | ICD-10-CM | POA: Diagnosis not present

## 2017-12-17 DIAGNOSIS — H35371 Puckering of macula, right eye: Secondary | ICD-10-CM | POA: Diagnosis not present

## 2017-12-17 DIAGNOSIS — H11153 Pinguecula, bilateral: Secondary | ICD-10-CM | POA: Diagnosis not present

## 2017-12-17 DIAGNOSIS — H11821 Conjunctivochalasis, right eye: Secondary | ICD-10-CM | POA: Diagnosis not present

## 2017-12-17 DIAGNOSIS — H2511 Age-related nuclear cataract, right eye: Secondary | ICD-10-CM | POA: Diagnosis not present

## 2017-12-17 DIAGNOSIS — H353131 Nonexudative age-related macular degeneration, bilateral, early dry stage: Secondary | ICD-10-CM | POA: Diagnosis not present

## 2017-12-17 DIAGNOSIS — H2512 Age-related nuclear cataract, left eye: Secondary | ICD-10-CM | POA: Diagnosis not present

## 2017-12-21 DIAGNOSIS — H2511 Age-related nuclear cataract, right eye: Secondary | ICD-10-CM | POA: Diagnosis not present

## 2018-01-01 ENCOUNTER — Other Ambulatory Visit: Payer: PPO

## 2018-01-15 ENCOUNTER — Ambulatory Visit: Payer: PPO

## 2018-01-25 ENCOUNTER — Ambulatory Visit (INDEPENDENT_AMBULATORY_CARE_PROVIDER_SITE_OTHER): Payer: PPO | Admitting: Family Medicine

## 2018-01-25 ENCOUNTER — Encounter: Payer: Self-pay | Admitting: Family Medicine

## 2018-01-25 VITALS — BP 136/78 | HR 57 | Ht 69.0 in | Wt 214.0 lb

## 2018-01-25 DIAGNOSIS — E119 Type 2 diabetes mellitus without complications: Secondary | ICD-10-CM | POA: Diagnosis not present

## 2018-01-25 DIAGNOSIS — I2510 Atherosclerotic heart disease of native coronary artery without angina pectoris: Secondary | ICD-10-CM

## 2018-01-25 DIAGNOSIS — E785 Hyperlipidemia, unspecified: Secondary | ICD-10-CM

## 2018-01-25 DIAGNOSIS — Z23 Encounter for immunization: Secondary | ICD-10-CM

## 2018-01-25 DIAGNOSIS — I10 Essential (primary) hypertension: Secondary | ICD-10-CM | POA: Diagnosis not present

## 2018-01-25 DIAGNOSIS — G4733 Obstructive sleep apnea (adult) (pediatric): Secondary | ICD-10-CM | POA: Diagnosis not present

## 2018-01-25 DIAGNOSIS — I2584 Coronary atherosclerosis due to calcified coronary lesion: Secondary | ICD-10-CM

## 2018-01-25 LAB — LP+ALT+AST PICCOLO, WAIVED
ALT (SGPT) Piccolo, Waived: 52 U/L — ABNORMAL HIGH (ref 10–47)
AST (SGOT) PICCOLO, WAIVED: 36 U/L (ref 11–38)
CHOLESTEROL PICCOLO, WAIVED: 111 mg/dL (ref <200)
Chol/HDL Ratio Piccolo,Waive: 2.1 mg/dL
HDL CHOL PICCOLO, WAIVED: 53 mg/dL — AB (ref >59)
LDL Chol Calc Piccolo Waived: 28 mg/dL (ref <100)
Triglycerides Piccolo,Waived: 150 mg/dL — ABNORMAL HIGH (ref <150)
VLDL Chol Calc Piccolo,Waive: 30 mg/dL — ABNORMAL HIGH (ref <30)

## 2018-01-25 LAB — BAYER DCA HB A1C WAIVED: HB A1C: 7.7 % — AB (ref <7.0)

## 2018-01-25 MED ORDER — FLUTICASONE PROPIONATE 50 MCG/ACT NA SUSP: 2.0000 | Freq: Every day | NASAL | 4 refills | Status: DC

## 2018-01-25 MED ORDER — METFORMIN HCL 500 MG PO TABS: 500.0000 mg | ORAL_TABLET | Freq: Two times a day (BID) | ORAL | 3 refills | Status: DC

## 2018-01-25 NOTE — Assessment & Plan Note (Signed)
The current medical regimen is effective;  continue present plan and medications.  

## 2018-01-25 NOTE — Assessment & Plan Note (Signed)
Uses bipap

## 2018-01-25 NOTE — Progress Notes (Signed)
BP 136/78 (BP Location: Left Arm)   Pulse (!) 57   Ht 5\' 9"  (1.753 m)   Wt 214 lb (97.1 kg)   SpO2 98%   BMI 31.60 kg/m    Subjective:    Patient ID: Andrew Cobb, male    DOB: 1950-09-19, 67 y.o.   MRN: [id number]  HPI: Andrew Cobb is a 67 y.o. male  Chief Complaint  Patient presents with  . Follow-up  Patient follow-up doing well no complaints taking blood pressure medicines without problems or issues good control. Diabetes cholesterol doing well with no problems or concerns taking medications faithfully without problems.  Does want Flonase as by prescription its no cost and allergies have been flaring.  Patient also with diagnosis of obstructive sleep apnea uses BiPAP every night faithfully with good resolution of daytime drowsiness.  To summarize again uses his BiPAP faithfully and has good results.  Relevant past medical, surgical, family and social history reviewed and updated as indicated. Interim medical history since our last visit reviewed. Allergies and medications reviewed and updated.  Review of Systems  Constitutional: Negative.   Respiratory: Negative.   Cardiovascular: Negative.     Per HPI unless specifically indicated above     Objective:    BP 136/78 (BP Location: Left Arm)   Pulse (!) 57   Ht 5\' 9"  (1.753 m)   Wt 214 lb (97.1 kg)   SpO2 98%   BMI 31.60 kg/m   Wt Readings from Last 3 Encounters:  01/25/18 214 lb (97.1 kg)  08/12/17 214 lb 8 oz (97.3 kg)  07/21/17 213 lb (96.6 kg)    Physical Exam  Constitutional: He is oriented to person, place, and time. He appears well-developed and well-nourished.  HENT:  Head: Normocephalic and atraumatic.  Eyes: Conjunctivae and EOM are normal.  Neck: Normal range of motion.  Cardiovascular: Normal rate, regular rhythm and normal heart sounds.  Pulmonary/Chest: Effort normal and breath sounds normal.  Musculoskeletal: Normal range of motion.  Neurological: He is alert and oriented to person,  place, and time.  Skin: No erythema.  Psychiatric: He has a normal mood and affect. His behavior is normal. Judgment and thought content normal.        Assessment & Plan:   Problem List Items Addressed This Visit      Cardiovascular and Mediastinum   Coronary atherosclerosis    The current medical regimen is effective;  continue present plan and medications.       Essential hypertension - Primary    The current medical regimen is effective;  continue present plan and medications.       Relevant Orders   Basic metabolic panel   Bayer DCA Hb A1c Waived   LP+ALT+AST Piccolo, Waived     Respiratory   OSA (obstructive sleep apnea)    Uses bipap        Endocrine   Type 2 diabetes mellitus without complication, without long-term current use of insulin ([place])    Discussed diabetes poor control and poor control pretty much all year will increase metformin from 500 once a day to 500 twice a day and recheck 3 months or so.      Relevant Orders   Basic metabolic panel   Bayer DCA Hb A1c Waived   LP+ALT+AST Piccolo, Waived     Other   Hyperlipidemia    The current medical regimen is effective;  continue present plan and medications.       Relevant  Orders   Basic metabolic panel   Bayer [place] Hb A1c Waived   LP+ALT+AST Piccolo, [place]    Other Visit Diagnoses    Need for vaccination with 13-polyvalent pneumococcal conjugate vaccine           Follow up plan: Return in about 3 months (around 04/27/2018) for Hemoglobin A1c.

## 2018-01-25 NOTE — Assessment & Plan Note (Signed)
Discussed diabetes poor control and poor control pretty much all year will increase metformin from 500 once a day to 500 twice a day and recheck 3 months or so.

## 2018-01-26 ENCOUNTER — Telehealth: Payer: Self-pay | Admitting: Family Medicine

## 2018-01-26 ENCOUNTER — Encounter: Payer: Self-pay | Admitting: Family Medicine

## 2018-01-26 LAB — BASIC METABOLIC PANEL
BUN / CREAT RATIO: 18 (ref 10–24)
BUN: 18 mg/dL (ref 8–27)
CHLORIDE: 102 mmol/L (ref 96–106)
CO2: 23 mmol/L (ref 20–29)
Calcium: 9.3 mg/dL (ref 8.6–10.2)
Creatinine, Ser: 1.01 mg/dL (ref 0.76–1.27)
GFR calc Af Amer: 89 mL/min/{1.73_m2} (ref >59)
GFR calc non Af Amer: 77 mL/min/{1.73_m2} (ref >59)
GLUCOSE: 155 mg/dL — AB (ref 65–99)
POTASSIUM: 4.4 mmol/L (ref 3.5–5.2)
SODIUM: 141 mmol/L (ref 134–144)

## 2018-01-26 NOTE — Telephone Encounter (Signed)
Copied from [place] [phone]. Topic: Quick Communication - See Telephone Encounter >> Jan 26, 2018  4:05 PM Valla Leaver wrote: CRM for notification. See Telephone encounter for: 01/26/18. Missy with [facility] calling to confirm dose change on  metFORMIN (GLUCOPHAGE) 500 MG tablet. VW#[id number]

## 2018-01-26 NOTE — Telephone Encounter (Signed)
500 mg metformin twice a day is the correct dose.

## 2018-01-27 NOTE — Telephone Encounter (Signed)
Called and left Missy a VM (it was a secure VM) letting her know what Dr. Jeananne Rama said. Asked for her to call back if she has any other questions or concerns.

## 2018-02-04 DIAGNOSIS — H353132 Nonexudative age-related macular degeneration, bilateral, intermediate dry stage: Secondary | ICD-10-CM | POA: Diagnosis not present

## 2018-02-04 DIAGNOSIS — H35721 Serous detachment of retinal pigment epithelium, right eye: Secondary | ICD-10-CM | POA: Diagnosis not present

## 2018-02-04 DIAGNOSIS — H35371 Puckering of macula, right eye: Secondary | ICD-10-CM | POA: Diagnosis not present

## 2018-02-04 DIAGNOSIS — H353211 Exudative age-related macular degeneration, right eye, with active choroidal neovascularization: Secondary | ICD-10-CM | POA: Diagnosis not present

## 2018-02-05 DIAGNOSIS — H353211 Exudative age-related macular degeneration, right eye, with active choroidal neovascularization: Secondary | ICD-10-CM | POA: Diagnosis not present

## 2018-02-09 DIAGNOSIS — M4722 Other spondylosis with radiculopathy, cervical region: Secondary | ICD-10-CM | POA: Diagnosis not present

## 2018-02-11 ENCOUNTER — Encounter: Payer: Self-pay | Admitting: Urology

## 2018-02-11 ENCOUNTER — Other Ambulatory Visit: Payer: Self-pay | Admitting: Urology

## 2018-02-11 ENCOUNTER — Ambulatory Visit: Payer: PPO | Admitting: Urology

## 2018-02-11 VITALS — BP 137/79 | HR 60 | Ht 69.0 in | Wt 207.2 lb

## 2018-02-11 DIAGNOSIS — N400 Enlarged prostate without lower urinary tract symptoms: Secondary | ICD-10-CM | POA: Diagnosis not present

## 2018-02-11 DIAGNOSIS — C61 Malignant neoplasm of prostate: Secondary | ICD-10-CM

## 2018-02-11 DIAGNOSIS — R972 Elevated prostate specific antigen [PSA]: Secondary | ICD-10-CM | POA: Diagnosis not present

## 2018-02-11 MED ORDER — LEVOFLOXACIN 500 MG PO TABS
500.0000 mg | ORAL_TABLET | Freq: Once | ORAL | Status: AC
  Administered 2018-02-11: 500 mg via ORAL

## 2018-02-11 MED ORDER — GENTAMICIN SULFATE 40 MG/ML IJ SOLN
80.0000 mg | Freq: Once | INTRAMUSCULAR | Status: AC
  Administered 2018-02-11: 80 mg via INTRAMUSCULAR

## 2018-02-11 NOTE — Progress Notes (Signed)
Prostate Biopsy Procedure   67 year old male with very low risk prostate cancer who presents for confirmatory biopsy.  Informed consent was obtained after discussing risks/benefits of the procedure.  A time out was performed to ensure correct patient identity.  Pre-Procedure: - Last PSA Level: 07/01/2017 5.0 - Gentamicin given prophylactically - Levaquin 500 mg administered PO -Transrectal Ultrasound performed revealing a 78 gm prostate -No significant hypoechoic or median lobe noted  Procedure: - Prostate block performed using 10 cc 1% lidocaine and biopsies taken from sextant areas, a total of 12 under ultrasound guidance.  Post-Procedure: - Patient tolerated the procedure well - He was counseled to seek immediate medical attention if experiences any severe pain, significant bleeding, or fevers - Return in one week to discuss biopsy results   John Giovanni, MD

## 2018-02-17 ENCOUNTER — Other Ambulatory Visit: Payer: Self-pay | Admitting: Urology

## 2018-02-17 LAB — PATHOLOGY REPORT

## 2018-02-19 ENCOUNTER — Telehealth: Payer: Self-pay | Admitting: Urology

## 2018-02-19 NOTE — Telephone Encounter (Signed)
Lab is scanned in chart. Please review and advise. Thanks

## 2018-02-19 NOTE — Telephone Encounter (Signed)
pts wife called in for him wanting biopsy results, requesting call back at cell [phone]

## 2018-02-21 NOTE — Telephone Encounter (Signed)
Confirmatory biopsy showed no cancer.  Continued active surveillance for his low risk prostate cancer was appropriate and recommend follow-up 6 months with a PSA/DRE.  Okay to cancel biopsy follow-up appointment.

## 2018-02-22 ENCOUNTER — Telehealth: Payer: Self-pay | Admitting: Urology

## 2018-02-22 NOTE — Telephone Encounter (Signed)
Pt's wife called and I read note from [facility].  Just F.Y.I.

## 2018-02-24 NOTE — Telephone Encounter (Signed)
Left message for pt to call ofice

## 2018-02-24 NOTE — Telephone Encounter (Signed)
Pt informed

## 2018-02-26 ENCOUNTER — Ambulatory Visit: Payer: PPO | Admitting: Urology

## 2018-03-09 DIAGNOSIS — H353132 Nonexudative age-related macular degeneration, bilateral, intermediate dry stage: Secondary | ICD-10-CM | POA: Diagnosis not present

## 2018-03-09 DIAGNOSIS — H35721 Serous detachment of retinal pigment epithelium, right eye: Secondary | ICD-10-CM | POA: Diagnosis not present

## 2018-03-09 DIAGNOSIS — H353211 Exudative age-related macular degeneration, right eye, with active choroidal neovascularization: Secondary | ICD-10-CM | POA: Diagnosis not present

## 2018-03-09 DIAGNOSIS — H35371 Puckering of macula, right eye: Secondary | ICD-10-CM | POA: Diagnosis not present

## 2018-04-15 ENCOUNTER — Encounter: Payer: Self-pay | Admitting: Family Medicine

## 2018-04-20 DIAGNOSIS — H26491 Other secondary cataract, right eye: Secondary | ICD-10-CM | POA: Diagnosis not present

## 2018-04-20 DIAGNOSIS — H353211 Exudative age-related macular degeneration, right eye, with active choroidal neovascularization: Secondary | ICD-10-CM | POA: Diagnosis not present

## 2018-04-20 DIAGNOSIS — H35371 Puckering of macula, right eye: Secondary | ICD-10-CM | POA: Diagnosis not present

## 2018-04-20 DIAGNOSIS — H35721 Serous detachment of retinal pigment epithelium, right eye: Secondary | ICD-10-CM | POA: Diagnosis not present

## 2018-04-20 DIAGNOSIS — H353132 Nonexudative age-related macular degeneration, bilateral, intermediate dry stage: Secondary | ICD-10-CM | POA: Diagnosis not present

## 2018-04-22 ENCOUNTER — Telehealth: Payer: Self-pay

## 2018-04-22 ENCOUNTER — Ambulatory Visit (INDEPENDENT_AMBULATORY_CARE_PROVIDER_SITE_OTHER): Payer: PPO

## 2018-04-22 VITALS — BP 130/70 | HR 64 | Temp 98.2°F | Ht 70.0 in | Wt 214.2 lb

## 2018-04-22 DIAGNOSIS — Z23 Encounter for immunization: Secondary | ICD-10-CM

## 2018-04-22 DIAGNOSIS — Z0000 Encounter for general adult medical examination without abnormal findings: Secondary | ICD-10-CM

## 2018-04-22 NOTE — Telephone Encounter (Signed)
Called for diabetic eye eam results, fax number for [facility] provided. They will fax over results.

## 2018-04-22 NOTE — Progress Notes (Signed)
Subjective:   Andrew Cobb is a 67 y.o. male who presents for Medicare Annual/Subsequent preventive examination.  Review of Systems: N/A Cardiac Risk Factors include: advanced age (>71mn, >>66women);hypertension;diabetes mellitus;dyslipidemia;male gender;obesity (BMI >30kg/m2)     Objective:    Vitals: BP 130/70 (BP Location: Left Arm, Patient Position: Sitting, Cuff Size: Normal)   Pulse 64   Temp 98.2 F (36.8 C) (Temporal)   Ht _0  (1.778 m)   Wt 214 lb 3.2 oz (97.2 kg)   BMI 30.73 kg/m   Body mass index is 30.73 kg/m.  Advanced Directives 04/22/2018 08/12/2017 04/20/2017 02/26/2017 09/12/2015 07/13/2015 07/13/2015  Does Patient Have a Medical Advance Directive? Yes Yes Yes - Yes Yes -  Type of Advance Directive Living will;Healthcare [place] of [place] Living will;Healthcare Power of [place] will -  Does patient want to make changes to medical advance directive? - - - - - No - Patient declined -  Copy of [place] Chart? No - copy requested Yes No - copy requested No - copy requested Yes No - copy requested (No Data)    Tobacco Social History   Tobacco Use  Smoking Status Former Smoker  . Packs/day: 1.00  . Years: 10.00  . Pack years: 10.00  . Types: Cigarettes  . Last attempt to quit: 07/07/1984  . Years since quitting: 33.8  Smokeless Tobacco Never Used  Tobacco Comment   quit 1986     Counseling given: Not Answered Comment: quit 1986   Clinical Intake:           Nutritional Risks: None Diabetes: Yes CBG done?: No Did pt. bring in CBG monitor from home?: No   Nutrition Risk Assessment:  Has the patient had any N/V/D within the last 2 months?  No  Does the patient have any non-healing wounds?  No  Has the patient had any unintentional weight loss or weight gain?  No    Diabetes:  Is the patient diabetic?  Yes  If diabetic, was a CBG obtained today?  No  Did the patient bring in their glucometer from home?  No  How often do you monitor your CBG's? Once a day.   Financial Strains and Diabetes Management:  Are you having any financial strains with the device, your supplies or your medication? No .  Would the patient like to be referred to a Nutritionist or for Diabetic Management?  No   Diabetic Exams:  Diabetic Eye Exam: Pt has been advised about the importance in completing this exam. [facility]   Diabetic Foot Exam: Pt is scheduled for diabetic foot exam on 05/12/2018.  How often do you need to have someone help you when you read instructions, pamphlets, or other written materials from your doctor or pharmacy?: 1 - Never What is the last grade level you completed in school?: 1 year college   Interpreter Needed?: No  Information entered by :: Tiffany Hill,LPN   Past Medical History:  Diagnosis Date  . Arthritis   . Asthma   . Cancer ([facility]    prostate  . Chronic venous insufficiency    varicose vein lower extremity with inflammation  . Coronary artery disease 1996   two stents placed   . Diabetes [place] complication ([place]    type 2 on metformin  . GERD (gastroesophageal  reflux disease)    no issues since gastric bypass surgery as stated per pt  . Hyperlipidemia   . Hypogonadism in male   . MRSA (methicillin resistant Staphylococcus aureus) infection    07/30/2008 thru 08/07/2008  . Sleep apnea    on BIPAP  . Stented coronary artery   . Thrombocythemia [facility])    Past Surgical History:  Procedure Laterality Date  . ANGIOPLASTY    . ANGIOPLASTY     with stent 04/07/1995  . ANTERIOR CERVICAL DECOMPRESSION/DISCECTOMY FUSION 4 LEVELS N/A 08/17/2017   Procedure: Anterior discectomy with fusion and plate fixation Cervical Three-Four, Four-Five, Five-Six, and Six-Seven Fusion;  Surgeon: Ditty,  Kevan Ny, MD;  Location: [place];  Service: Neurosurgery;  Laterality: N/A;  Anterior discectomy with fusion and plate fixation Cervical Three-Four, Four-Five, Five-Six, and Six-Seven Fusion   . APPENDECTOMY     1966  . CARDIAC CATHETERIZATION  1996  . CARDIOVASCULAR STRESS TEST     07/31/2011  . CARPAL TUNNEL RELEASE Left   . CHOLECYSTECTOMY     2006  . colonscopy      08/25/2012  . EYE SURGERY Right    cataract  . FUNCTIONAL ENDOSCOPIC SINUS SURGERY     11/10/2013  . GASTRIC BYPASS     10/05/2012  . HERNIA REPAIR     left inguinal 1981  . INCISION AND DRAINAGE ABSCESS Right 02/26/2017   Procedure: INCISION AND DRAINAGE ABSCESS;  Surgeon: Nickie Retort, MD;  Location: [facility];  Service: Urology;  Laterality: Right;  . JOINT REPLACEMENT     bilateral  . left ankle surgery      05/03/2003   . left carpel tunnel      09/18/1993  . left knee meniscal tear      01/25/2010  . left knee meniscal tear repair      05/04/1996  . left rotator cuff repair      05/03/2003   . REPLACEMENT TOTAL KNEE BILATERAL  07/13/2015  . right ankle surgery      fracture has 2 screws 07/07/1997  . right carpel tunnel      05/16/1992  . right shoulder replacement      01/27/2006  . SCROTAL EXPLORATION Right 02/26/2017   Procedure: SCROTUM EXPLORATION;  Surgeon: Nickie Retort, MD;  Location: [facility];  Service: Urology;  Laterality: Right;  . TOTAL KNEE ARTHROPLASTY Left 07/13/2015   Procedure: LEFT TOTAL KNEE ARTHROPLASTY;  Surgeon: Gaynelle Arabian, MD;  Location: [facility];  Service: Orthopedics;  Laterality: Left;   Family History  Problem Relation Age of Onset  . Cancer Mother        pancreatic  . Diabetes Mother   . Stroke Mother   . Heart disease Mother   . Hyperlipidemia Mother   . Hypertension Mother   . Heart attack Mother   . Heart disease Father   . Stroke Father   . Diabetes Father   . Hypertension Father   . Heart attack Father   . Hyperlipidemia Father   . Pancreatic  cancer Father   . Cancer Sister   . Cancer Brother        lung  . Cancer Brother   . Kidney cancer Neg Hx   . Bladder Cancer Neg Hx   . Prostate cancer Neg Hx    Social History   Socioeconomic History  . Marital status: Married    Spouse name: Not on file  . Number of children: Not on file  .  Years of education: Not on file  . Highest education level: Some college, no degree  Occupational History    Comment: drives for [employer]   Social Needs  . Financial resource strain: Not hard at all  . Food insecurity:    Worry: Never true    Inability: Never true  . Transportation needs:    Medical: No    Non-medical: No  Tobacco Use  . Smoking status: Former Smoker    Packs/day: 1.00    Years: 10.00    Pack years: 10.00    Types: Cigarettes    Last attempt to quit: 07/07/1984    Years since quitting: 33.8  . Smokeless tobacco: Never Used  . Tobacco comment: quit 1986  Substance and Sexual Activity  . Alcohol use: No    Alcohol/week: 0.0 standard drinks  . Drug use: No  . Sexual activity: Yes  Lifestyle  . Physical activity:    Days per week: 0 days    Minutes per session: 0 min  . Stress: Not at all  Relationships  . Social connections:    Talks on phone: More than three times a week    Gets together: More than three times a week    Attends religious service: More than 4 times per year    Active member of club or organization: Yes    Attends meetings of clubs or organizations: Not on file    Relationship status: Married  Other Topics Concern  . Not on file  Social History Narrative  . Not on file    Outpatient Encounter Medications as of 04/22/2018  Medication Sig  . albuterol (PROVENTIL HFA;VENTOLIN HFA) 108 (90 Base) MCG/ACT inhaler Inhale into the lungs.  [name] Kitchen atorvastatin (LIPITOR) 40 MG tablet Take 1 tablet (40 mg total) by mouth daily. (Patient taking differently: Take 40 mg by mouth every evening. )  . Blood Glucose Monitoring Suppl (FIFTY50 GLUCOSE METER 2.0)  w/Device KIT Use.  . cetirizine (ZYRTEC) 10 MG tablet Take 10 mg by mouth daily.  . Cholecalciferol (VITAMIN D) 2000 units CAPS Take 2,000 Units by mouth daily.  [name] Kitchen glucose blood test strip 1 each by Other route 2 (two) times daily. DX E11.9  . Lancets (ONETOUCH ULTRASOFT) lancets 1 each by Other route 2 (two) times daily. Dx E11.9  . lisinopril (PRINIVIL,ZESTRIL) 10 MG tablet Take 1 tablet (10 mg total) by mouth daily.  . Magnesium 250 MG TABS Take 250 mg by mouth at bedtime.   . metFORMIN (GLUCOPHAGE) 500 MG tablet Take 1 tablet (500 mg total) by mouth 2 (two) times daily with a meal. Once daily  . Multiple Vitamins-Minerals (MULTIVITAMIN PO) Take 1 tablet by mouth daily.   . Probiotic CAPS Take 1 capsule by mouth every evening.  . valACYclovir (VALTREX) 500 MG tablet Take 1 tablet (500 mg total) by mouth daily. (Patient taking differently: Take 500 mg by mouth every evening. )  . fluticasone (FLONASE) 50 MCG/ACT nasal spray Place 2 sprays into both nostrils daily. (Patient not taking: Reported on 04/22/2018)  . Homeopathic Products (ZICAM COLD REMEDY NA) Place 1 spray into the nose daily as needed (congestion).  [name] Kitchen ketoconazole (NIZORAL) 2 % shampoo USE TWICE WEEKLY. LATHER AND LEAVE ON FOR 5 MINUTES. RINSE WELL  . Magnesium Gluconate 550 MG TABS Take by mouth.   No facility-administered encounter medications on file as of 04/22/2018.     Activities of Daily Living In your present state of health, do you have any difficulty performing  the following activities: 04/22/2018 08/12/2017  Hearing? Y N  Comment hearing aids  -  Vision? Y N  Comment has macular degeneration in right eye. Goes to [name]  -  Difficulty concentrating or making decisions? N N  Walking or climbing stairs? N N  Dressing or bathing? N N  Doing errands, shopping? N N  Preparing Food and eating ? N -  Using the Toilet? N -  In the past six months, have you accidently leaked urine? N -  Do you have problems  with loss of bowel control? N -  Managing your Medications? N -  Managing your Finances? N -  Housekeeping or managing your Housekeeping? N -  Some recent data might be hidden    Patient Care Team: Guadalupe Maple, MD as PCP - General (Family Medicine)   Assessment:   This is a routine wellness examination for [place].  Exercise Activities and Dietary recommendations Current Exercise Habits: Structured exercise class, Type of exercise: strength training/weights, Time (Minutes): 60, Frequency (Times/Week): 3, Weekly Exercise (Minutes/Week): 180, Intensity: Mild, Exercise limited by: None identified  Goals    . DIET - INCREASE WATER INTAKE     Recommend drinking at least 6-8 glasses of water a day        Fall Risk Fall Risk  04/22/2018 07/21/2017 04/20/2017 12/16/2016 10/08/2016  Falls in the past year? No No No No Yes  Number falls in past yr: - - - - 1  Injury with Fall? - - - - No   FALL RISK PREVENTION PERTAINING TO THE HOME:  Any stairs in or around the home WITH handrails? Yes  Home free of loose throw rugs in walkways, pet beds, electrical cords, etc? Yes  Adequate lighting in your home to reduce risk of falls? Yes   ASSISTIVE DEVICES UTILIZED TO PREVENT FALLS:  Life alert? No  Use of a cane, walker or w/c? No  Grab bars in the bathroom? No  Shower chair or bench in shower? No  Elevated toilet seat or a handicapped toilet? No   DME ORDERS:  DME order needed?  No   TIMED UP AND GO:  Was the test performed? Yes .  Length of time to ambulate 10 feet: 8 sec.   GAIT:  Appearance of gait: Gait stead-fast without the use of an assistive device. Education: Fall risk prevention has been discussed.  Intervention(s) required? No    Depression Screen PHQ 2/9 Scores 04/22/2018 07/21/2017 04/20/2017 07/17/2016  PHQ - 2 Score 0 0 0 0    Cognitive Function     6CIT Screen 04/22/2018 04/20/2017  What Year? 0 points 0 points  What month? 0 points 0 points  What time?  0 points 0 points  Count back from 20 0 points 0 points  Months in reverse 0 points -  Repeat phrase 0 points 0 points  Total Score 0 -    Immunization History  Administered Date(s) Administered  . Influenza Whole 04/09/2009  . Influenza, High Dose Seasonal PF 04/06/2017, 04/22/2018  . Influenza,inj,Quad PF,6+ Mos 04/24/2015  . Influenza-Unspecified 03/07/2014, 04/06/2017  . Td 03/12/2005  . Tdap 07/17/2016  . Zoster 07/17/2016    Qualifies for Shingles Vaccine? Yes . Due for Shingrix. Education has been provided regarding the importance of this vaccine. Pt has been advised to call insurance company to determine out of pocket expense. Advised may also receive vaccine at local pharmacy or Health Dept. Verbalized acceptance and understanding.  Tdap: completed  07/17/2016  Flu Vaccine: completed today   Pneumococcal Vaccine: Due for Pneumococcal vaccine. Does the patient want to receive this vaccine today?  No . Education has been provided regarding the importance of this vaccine but still declined. Advised may receive this vaccine at local pharmacy or Health Dept. Aware to provide a copy of the vaccination record if obtained from local pharmacy or Health Dept. Verbalized acceptance and understanding.  Screening Tests Health Maintenance  Topic Date Due  . OPHTHALMOLOGY EXAM  04/06/2017  . FOOT EXAM  10/08/2017  . INFLUENZA VACCINE  02/04/2018  . PNA vac Low Risk Adult (1 of 2 - PCV13) 01/26/2019 (Originally 01/21/2016)  . HEMOGLOBIN A1C  07/28/2018  . COLONOSCOPY  08/24/2022  . TETANUS/TDAP  07/17/2026  . Hepatitis C Screening  Completed   Cancer Screenings:  Colorectal Screening: Completed 08/24/2012. Repeat every 10 years   Lung Cancer Screening: (Low Dose CT Chest recommended if Age 51-80 years, 30 pack-year currently smoking OR have quit w/in 15years.) does not qualify.    Additional Screening:  Hepatitis C Screening: does qualify; Completed 07/17/2016  Dental Screening:  Recommended annual dental exams for proper oral hygiene  Community Resource Referral:  CRR required this visit?  No       Plan:    I have personally reviewed and addressed the Medicare Annual Wellness questionnaire and have noted the following in the patient's chart:  A. Medical and social history B. Use of alcohol, tobacco or illicit drugs  C. Current medications and supplements D. Functional ability and status E.  Nutritional status F.  Physical activity G. Advance directives H. List of other physicians I.  Hospitalizations, surgeries, and ER visits in previous 12 months J.  [place] such as hearing and vision if needed, cognitive and depression L. Referrals and appointments   In addition, I have reviewed and discussed with patient certain preventive protocols, quality metrics, and best practice recommendations. A written personalized care plan for preventive services as well as general preventive health recommendations were provided to patient.   Signed,  Tyler Aas, LPN Nurse Health Advisor   Nurse Notes: due for diabetic foot exam- [facility] on 05/12/2018.

## 2018-04-22 NOTE — Patient Instructions (Signed)
[name] , Thank you for taking time to come for your Medicare Wellness Visit. I appreciate your ongoing commitment to your health goals. Please review the following plan we discussed and let me know if I can assist you in the future.   Screening recommendations/referrals: Colonoscopy: completed, due 2024 Recommended yearly ophthalmology/optometry visit for glaucoma screening and checkup Recommended yearly dental visit for hygiene and checkup  Vaccinations: Influenza vaccine: done today Pneumococcal vaccine: declined today, will get prevnar 13 at visit in November.  Tdap vaccine: up to date Shingles vaccine: shingrix eligible, check with your insurance company for coverage     Advanced directives: Please bring a copy of your health care power of attorney and living will to the office at your convenience.  Conditions/risks identified: Recommend drinking at least 6-8 glasses of water a day   Next appointment: Follow up on 05/12/2018 at 9:45am with Dr.Crissman. Follow up in one year for your annual wellness exam.   Preventive Care 65 Years and Older, Male Preventive care refers to lifestyle choices and visits with your health care provider that can promote health and wellness. What does preventive care include?  A yearly physical exam. This is also called an annual well check.  Dental exams once or twice a year.  Routine eye exams. Ask your health care provider how often you should have your eyes checked.  Personal lifestyle choices, including:  Daily care of your teeth and gums.  Regular physical activity.  Eating a healthy diet.  Avoiding tobacco and drug use.  Limiting alcohol use.  Practicing safe sex.  Taking low doses of aspirin every day.  Taking vitamin and mineral supplements as recommended by your health care provider. What happens during an annual well check? The services and screenings done by your health care provider during your annual well check will  depend on your age, overall health, lifestyle risk factors, and family history of disease. Counseling  Your health care provider may ask you questions about your:  Alcohol use.  Tobacco use.  Drug use.  Emotional well-being.  Home and relationship well-being.  Sexual activity.  Eating habits.  History of falls.  Memory and ability to understand (cognition).  Work and work Statistician. Screening  You may have the following tests or measurements:  Height, weight, and BMI.  Blood pressure.  Lipid and cholesterol levels. These may be checked every 5 years, or more frequently if you are over 76 years old.  Skin check.  Lung cancer screening. You may have this screening every year starting at age 66 if you have a 30-pack-year history of smoking and currently smoke or have quit within the past 15 years.  Fecal occult blood test (FOBT) of the stool. You may have this test every year starting at age 29.  Flexible sigmoidoscopy or colonoscopy. You may have a sigmoidoscopy every 5 years or a colonoscopy every 10 years starting at age 22.  Prostate cancer screening. Recommendations will vary depending on your family history and other risks.  Hepatitis C blood test.  Hepatitis B blood test.  Sexually transmitted disease (STD) testing.  Diabetes screening. This is done by checking your blood sugar (glucose) after you have not eaten for a while (fasting). You may have this done every 1-3 years.  Abdominal aortic aneurysm (AAA) screening. You may need this if you are a current or former smoker.  Osteoporosis. You may be screened starting at age 63 if you are at high risk. Talk with your health care  provider about your test results, treatment options, and if necessary, the need for more tests. Vaccines  Your health care provider may recommend certain vaccines, such as:  Influenza vaccine. This is recommended every year.  Tetanus, diphtheria, and acellular pertussis (Tdap,  Td) vaccine. You may need a Td booster every 10 years.  Zoster vaccine. You may need this after age 45.  Pneumococcal 13-valent conjugate (PCV13) vaccine. One dose is recommended after age 57.  Pneumococcal polysaccharide (PPSV23) vaccine. One dose is recommended after age 35. Talk to your health care provider about which screenings and vaccines you need and how often you need them. This information is not intended to replace advice given to you by your health care provider. Make sure you discuss any questions you have with your health care provider. Document Released: 07/20/2015 Document Revised: 03/12/2016 Document Reviewed: 04/24/2015 Elsevier Interactive Patient Education  2017 [place] Prevention in the Home Falls can cause injuries. They can happen to people of all ages. There are many things you can do to make your home safe and to help prevent falls. What can I do on the outside of my home?  Regularly fix the edges of walkways and driveways and fix any cracks.  Remove anything that might make you trip as you walk through a door, such as a raised step or threshold.  Trim any bushes or trees on the path to your home.  Use bright outdoor lighting.  Clear any walking paths of anything that might make someone trip, such as rocks or tools.  Regularly check to see if handrails are loose or broken. Make sure that both sides of any steps have handrails.  Any raised decks and porches should have guardrails on the edges.  Have any leaves, snow, or ice cleared regularly.  Use sand or salt on walking paths during winter.  Clean up any spills in your garage right away. This includes oil or grease spills. What can I do in the bathroom?  Use night lights.  Install grab bars by the toilet and in the tub and shower. Do not use towel bars as grab bars.  Use non-skid mats or decals in the tub or shower.  If you need to sit down in the shower, use a plastic, non-slip  stool.  Keep the floor dry. Clean up any water that spills on the floor as soon as it happens.  Remove soap buildup in the tub or shower regularly.  Attach bath mats securely with double-sided non-slip rug tape.  Do not have throw rugs and other things on the floor that can make you trip. What can I do in the bedroom?  Use night lights.  Make sure that you have a light by your bed that is easy to reach.  Do not use any sheets or blankets that are too big for your bed. They should not hang down onto the floor.  Have a firm chair that has side arms. You can use this for support while you get dressed.  Do not have throw rugs and other things on the floor that can make you trip. What can I do in the kitchen?  Clean up any spills right away.  Avoid walking on wet floors.  Keep items that you use a lot in easy-to-reach places.  If you need to reach something above you, use a strong step stool that has a grab bar.  Keep electrical cords out of the way.  Do not use floor polish  or wax that makes floors slippery. If you must use wax, use non-skid floor wax.  Do not have throw rugs and other things on the floor that can make you trip. What can I do with my stairs?  Do not leave any items on the stairs.  Make sure that there are handrails on both sides of the stairs and use them. Fix handrails that are broken or loose. Make sure that handrails are as long as the stairways.  Check any carpeting to make sure that it is firmly attached to the stairs. Fix any carpet that is loose or worn.  Avoid having throw rugs at the top or bottom of the stairs. If you do have throw rugs, attach them to the floor with carpet tape.  Make sure that you have a light switch at the top of the stairs and the bottom of the stairs. If you do not have them, ask someone to add them for you. What else can I do to help prevent falls?  Wear shoes that:  Do not have high heels.  Have rubber bottoms.  Are  comfortable and fit you well.  Are closed at the toe. Do not wear sandals.  If you use a stepladder:  Make sure that it is fully opened. Do not climb a closed stepladder.  Make sure that both sides of the stepladder are locked into place.  Ask someone to hold it for you, if possible.  Clearly mark and make sure that you can see:  Any grab bars or handrails.  First and last steps.  Where the edge of each step is.  Use tools that help you move around (mobility aids) if they are needed. These include:  Canes.  Walkers.  Scooters.  Crutches.  Turn on the lights when you go into a dark area. Replace any light bulbs as soon as they burn out.  Set up your furniture so you have a clear path. Avoid moving your furniture around.  If any of your floors are uneven, fix them.  If there are any pets around you, be aware of where they are.  Review your medicines with your doctor. Some medicines can make you feel dizzy. This can increase your chance of falling. Ask your doctor what other things that you can do to help prevent falls. This information is not intended to replace advice given to you by your health care provider. Make sure you discuss any questions you have with your health care provider. Document Released: 04/19/2009 Document Revised: 11/29/2015 Document Reviewed: 07/28/2014 Elsevier Interactive Patient Education  2017 [employer].

## 2018-04-27 ENCOUNTER — Ambulatory Visit: Payer: PPO | Admitting: Family Medicine

## 2018-05-05 ENCOUNTER — Encounter: Payer: Self-pay | Admitting: Podiatry

## 2018-05-05 ENCOUNTER — Ambulatory Visit: Payer: PPO | Admitting: Podiatry

## 2018-05-05 DIAGNOSIS — L03031 Cellulitis of right toe: Secondary | ICD-10-CM | POA: Diagnosis not present

## 2018-05-05 MED ORDER — CEPHALEXIN 500 MG PO CAPS: 500.0000 mg | ORAL_CAPSULE | Freq: Two times a day (BID) | ORAL | 0 refills | Status: DC

## 2018-05-05 NOTE — Patient Instructions (Signed)
After Care Instructions  1) Soak foot in soapy sudsy water for 15 minutes after removing the surgical bandage.  2) Re-bandage surgical site after the soaks with neosporin, a sterile 2x2 gauze pad and tape.  3) Perform this soak twice a day following redressing area as above.  4) Return to office in 1 week for re-evaluation.  5) Take Advil for pain medication as needed. If given an antibiotic, finish completely.

## 2018-05-05 NOTE — Progress Notes (Signed)
This patient presents to the office for an evaluation of his big toenail right big toe.  He says that he had this nail surgically removed 15 years ago.  He says fragments of nail frequently return and he was working on the fragment of nail and the toe became infected.  He says that over the weekend.  He says he has developed redness swelling and drainage from the inside of his right great toenail . this patient is diabetic.  He presents the office today for an evaluation of his previously infected big toenail right foot.  Patient is previously had the same problem approximately 6 months ago.   General Appearance  Alert, conversant and in no acute stress.  Vascular  Dorsalis pedis and posterior tibial  pulses are palpable  bilaterally.  Capillary return is within normal limits  bilaterally. Temperature is within normal limits  bilaterally.  Neurologic  Senn-Weinstein monofilament wire test within normal limits  bilaterally. Muscle power within normal limits bilaterally.  Nails Thick disfigured discolored nails with subungual debris  from second to fifth toes bilaterally.  Redness swelling and drainage noted at the medial aspect of the proximal nail fold.    Orthopedic  No limitations of motion of motion feet .  No crepitus or effusions noted.  No bony pathology or digital deformities noted.  Skin  normotropic skin with no porokeratosis noted bilaterally.  No signs of infections or ulcers noted.    Paronychia right hallux.  Rov.  Discussed this condition with this patient.  We discussed conservative vs. Surgical correction.  We decided to clean the medial aspect right nail fold under local anesthsia.    Recommended permanent phenol matrixectomy and patient agreed. Right hallux  was prepped with alcohol and a toe block of 3cc of 2% lidocaine plain was administered in a digital toe block. .  The toe was then prepped with betadine solution . A tourniquet was applied to toe. The offending nail fragments  were   excised and matrix tissue exposed.  Phenol was then applied to the matrix tissue followed by an alcohol wash.  Antibiotic ointment and a dry sterile dressing was applied.  The patient was dispensed instructions for aftercare. Prescribed cephalexin 500 mg.  # 15.  RTC 10 days.  Told him if this needs to be preformed again a Winograd procedure would be a better procedure.        Gardiner Barefoot DPM

## 2018-05-12 ENCOUNTER — Ambulatory Visit: Payer: PPO | Admitting: Family Medicine

## 2018-05-17 ENCOUNTER — Ambulatory Visit: Payer: PPO | Admitting: Podiatry

## 2018-06-14 DIAGNOSIS — J019 Acute sinusitis, unspecified: Secondary | ICD-10-CM | POA: Diagnosis not present

## 2018-06-14 DIAGNOSIS — R05 Cough: Secondary | ICD-10-CM | POA: Diagnosis not present

## 2018-06-14 DIAGNOSIS — B9689 Other specified bacterial agents as the cause of diseases classified elsewhere: Secondary | ICD-10-CM | POA: Diagnosis not present

## 2018-06-17 ENCOUNTER — Telehealth: Payer: Self-pay

## 2018-06-17 NOTE — Telephone Encounter (Signed)
Medication Adherence for patient for his diabetic medication is 56% and blood pressure medication 52%.   Patient stated he got his medications filled 2 weeks ago.   Also asked about diabetic eye exam, patient had his eye exam with Dr. Zadie Rhine in [place]. Number: [phone]. Will call for report.  Routing to provider as Juluis Rainier.

## 2018-07-03 DIAGNOSIS — J209 Acute bronchitis, unspecified: Secondary | ICD-10-CM | POA: Diagnosis not present

## 2018-07-03 DIAGNOSIS — R05 Cough: Secondary | ICD-10-CM | POA: Diagnosis not present

## 2018-07-05 DIAGNOSIS — H35721 Serous detachment of retinal pigment epithelium, right eye: Secondary | ICD-10-CM | POA: Diagnosis not present

## 2018-07-05 DIAGNOSIS — H353132 Nonexudative age-related macular degeneration, bilateral, intermediate dry stage: Secondary | ICD-10-CM | POA: Diagnosis not present

## 2018-07-05 DIAGNOSIS — H353211 Exudative age-related macular degeneration, right eye, with active choroidal neovascularization: Secondary | ICD-10-CM | POA: Diagnosis not present

## 2018-07-05 DIAGNOSIS — H35371 Puckering of macula, right eye: Secondary | ICD-10-CM | POA: Diagnosis not present

## 2018-07-05 DIAGNOSIS — H26491 Other secondary cataract, right eye: Secondary | ICD-10-CM | POA: Diagnosis not present

## 2018-07-05 LAB — HM DIABETES EYE EXAM

## 2018-07-15 DIAGNOSIS — R05 Cough: Secondary | ICD-10-CM | POA: Diagnosis not present

## 2018-07-22 DIAGNOSIS — Z961 Presence of intraocular lens: Secondary | ICD-10-CM | POA: Diagnosis not present

## 2018-07-22 DIAGNOSIS — H26491 Other secondary cataract, right eye: Secondary | ICD-10-CM | POA: Diagnosis not present

## 2018-07-22 DIAGNOSIS — H2512 Age-related nuclear cataract, left eye: Secondary | ICD-10-CM | POA: Diagnosis not present

## 2018-07-22 DIAGNOSIS — H353131 Nonexudative age-related macular degeneration, bilateral, early dry stage: Secondary | ICD-10-CM | POA: Diagnosis not present

## 2018-07-22 DIAGNOSIS — H35371 Puckering of macula, right eye: Secondary | ICD-10-CM | POA: Diagnosis not present

## 2018-07-23 DIAGNOSIS — Z96652 Presence of left artificial knee joint: Secondary | ICD-10-CM | POA: Diagnosis not present

## 2018-07-23 DIAGNOSIS — Z471 Aftercare following joint replacement surgery: Secondary | ICD-10-CM | POA: Diagnosis not present

## 2018-08-03 DIAGNOSIS — H2512 Age-related nuclear cataract, left eye: Secondary | ICD-10-CM | POA: Diagnosis not present

## 2018-08-09 ENCOUNTER — Encounter: Payer: Self-pay | Admitting: Family Medicine

## 2018-08-09 ENCOUNTER — Ambulatory Visit (INDEPENDENT_AMBULATORY_CARE_PROVIDER_SITE_OTHER): Payer: PPO | Admitting: Family Medicine

## 2018-08-09 ENCOUNTER — Other Ambulatory Visit: Payer: Self-pay

## 2018-08-09 VITALS — BP 131/73 | HR 70 | Temp 98.8°F | Ht 70.0 in | Wt 206.2 lb

## 2018-08-09 DIAGNOSIS — H2512 Age-related nuclear cataract, left eye: Secondary | ICD-10-CM | POA: Diagnosis not present

## 2018-08-09 DIAGNOSIS — R600 Localized edema: Secondary | ICD-10-CM

## 2018-08-09 NOTE — Progress Notes (Signed)
BP 131/73 (BP Location: Left Arm, Patient Position: Sitting, Cuff Size: Normal)   Pulse 70   Temp 98.8 F (37.1 C)   Ht 5\' 10"  (1.778 m)   Wt 206 lb 4 oz (93.6 kg)   SpO2 98%   BMI 29.59 kg/m    Subjective:    Patient ID: Andrew Cobb, male    DOB: [birth year], 68 y.o.   MRN: [id number]  HPI: Andrew Cobb is a 68 y.o. male  Chief Complaint  Patient presents with  . Leg Swelling    bilateral   Here today for mild b/l LE edema x 2 weeks. No dietary changes, sedentary lifestyle, CP, SOB, orthopnea, leg pain, redness. Does note he just finished 3 rounds of antibiotics and 2 rounds of prednisone 2 weeks ago but otherwise no recent medication changes. No hx of heart failure or PVD.   Relevant past medical, surgical, family and social history reviewed and updated as indicated. Interim medical history since our last visit reviewed. Allergies and medications reviewed and updated.  Review of Systems  Per HPI unless specifically indicated above     Objective:    BP 131/73 (BP Location: Left Arm, Patient Position: Sitting, Cuff Size: Normal)   Pulse 70   Temp 98.8 F (37.1 C)   Ht 5\' 10"  (1.778 m)   Wt 206 lb 4 oz (93.6 kg)   SpO2 98%   BMI 29.59 kg/m   Wt Readings from Last 3 Encounters:  08/09/18 206 lb 4 oz (93.6 kg)  04/22/18 214 lb 3.2 oz (97.2 kg)  02/11/18 207 lb 3.2 oz (94 kg)    Physical Exam Vitals signs and nursing note reviewed.  Constitutional:      Appearance: Normal appearance.  HENT:     Head: Atraumatic.  Eyes:     Extraocular Movements: Extraocular movements intact.     Conjunctiva/sclera: Conjunctivae normal.  Neck:     Musculoskeletal: Normal range of motion and neck supple.  Cardiovascular:     Rate and Rhythm: Normal rate and regular rhythm.  Pulmonary:     Effort: Pulmonary effort is normal.     Breath sounds: Normal breath sounds. No rales.  Musculoskeletal: Normal range of motion.        General: Swelling (trace edema b/l LE)  present. No tenderness.     Comments: - homan's sign - squeeze test  Skin:    General: Skin is warm and dry.  Neurological:     General: No focal deficit present.     Mental Status: He is oriented to person, place, and time.  Psychiatric:        Mood and Affect: Mood normal.        Thought Content: Thought content normal.        Judgment: Judgment normal.     Results for orders placed or performed in visit on 07/12/18  HM DIABETES EYE EXAM  Result Value Ref Range   HM Diabetic Eye Exam No Retinopathy No Retinopathy      Assessment & Plan:   Problem List Items Addressed This Visit    None    Visit Diagnoses    Bilateral leg edema    -  Primary   Trace, symmetric. Start compression stockings, leg elevation, salt restriction. Check labs to r/o renal issues/evidence of HF though presentation inconsistent   Relevant Orders   Comprehensive metabolic panel   B Nat Peptide       Follow up plan: Return for  as scheduled.

## 2018-08-10 LAB — COMPREHENSIVE METABOLIC PANEL
A/G RATIO: 1.9 (ref 1.2–2.2)
ALT: 63 IU/L — ABNORMAL HIGH (ref 0–44)
AST: 52 IU/L — ABNORMAL HIGH (ref 0–40)
Albumin: 3.3 g/dL — ABNORMAL LOW (ref 3.8–4.8)
Alkaline Phosphatase: 73 IU/L (ref 39–117)
BILIRUBIN TOTAL: 0.3 mg/dL (ref 0.0–1.2)
BUN / CREAT RATIO: 18 (ref 10–24)
BUN: 18 mg/dL (ref 8–27)
CHLORIDE: 106 mmol/L (ref 96–106)
CO2: 22 mmol/L (ref 20–29)
Calcium: 8.1 mg/dL — ABNORMAL LOW (ref 8.6–10.2)
Creatinine, Ser: 1.02 mg/dL (ref 0.76–1.27)
GFR calc non Af Amer: 76 mL/min/{1.73_m2} (ref >59)
GFR, EST AFRICAN AMERICAN: 88 mL/min/{1.73_m2} (ref >59)
GLOBULIN, TOTAL: 1.7 g/dL (ref 1.5–4.5)
GLUCOSE: 252 mg/dL — AB (ref 65–99)
POTASSIUM: 4.4 mmol/L (ref 3.5–5.2)
SODIUM: 143 mmol/L (ref 134–144)
TOTAL PROTEIN: 5 g/dL — AB (ref 6.0–8.5)

## 2018-08-10 LAB — BRAIN NATRIURETIC PEPTIDE: BNP: 27.1 pg/mL (ref 0.0–100.0)

## 2018-08-16 ENCOUNTER — Encounter: Payer: PPO | Admitting: Family Medicine

## 2018-08-19 ENCOUNTER — Encounter: Payer: Self-pay | Admitting: Family Medicine

## 2018-08-19 ENCOUNTER — Other Ambulatory Visit: Payer: Self-pay

## 2018-08-19 ENCOUNTER — Ambulatory Visit (INDEPENDENT_AMBULATORY_CARE_PROVIDER_SITE_OTHER): Payer: PPO | Admitting: Family Medicine

## 2018-08-19 VITALS — BP 119/75 | HR 72 | Temp 98.3°F | Ht 70.0 in | Wt 202.0 lb

## 2018-08-19 DIAGNOSIS — J3089 Other allergic rhinitis: Secondary | ICD-10-CM

## 2018-08-19 DIAGNOSIS — R062 Wheezing: Secondary | ICD-10-CM | POA: Diagnosis not present

## 2018-08-19 DIAGNOSIS — J309 Allergic rhinitis, unspecified: Secondary | ICD-10-CM | POA: Insufficient documentation

## 2018-08-19 MED ORDER — MONTELUKAST SODIUM 10 MG PO TABS: 10.0000 mg | ORAL_TABLET | Freq: Every day | ORAL | 3 refills | Status: DC

## 2018-08-19 NOTE — Assessment & Plan Note (Signed)
No evidence of bacterial infection, increase antihistamine to BID, flonase BID, and add singulair, humidifier, sinus rinses

## 2018-08-19 NOTE — Progress Notes (Signed)
BP 119/75 (BP Location: Right Arm, Patient Position: Sitting, Cuff Size: Normal)   Pulse 72   Temp 98.3 F (36.8 C) (Oral)   Ht 5\' 10"  (1.778 m)   Wt 202 lb (91.6 kg)   SpO2 98%   BMI 28.98 kg/m    Subjective:    Patient ID: Andrew Cobb, male    DOB: Sep 24, 1950, 68 y.o.   MRN: [id number]  HPI: NIEKO CLARIN is a 68 y.o. male  Chief Complaint  Patient presents with  . Cough    Pt states hes coughing up what he used to smoke and phelgm also  . Sinusitis    sinus is affecting his eyes and nasal area just had eye surgery and states its draining   Here today for returning productive cough, sinus drainage, eye drainage, and fatigue. Has already had multiple rounds of steroids and antibiotics the past two months but sxs keep returning. Takes zyrtec and flonase daily. Stopped them for a bit because he felt they weren't working but has gotten back on them the past few days. Denies fevers, chills, sweats, body aches. Lots of sick contacts.   Relevant past medical, surgical, family and social history reviewed and updated as indicated. Interim medical history since our last visit reviewed. Allergies and medications reviewed and updated.  Review of Systems  Per HPI unless specifically indicated above     Objective:    BP 119/75 (BP Location: Right Arm, Patient Position: Sitting, Cuff Size: Normal)   Pulse 72   Temp 98.3 F (36.8 C) (Oral)   Ht 5\' 10"  (1.778 m)   Wt 202 lb (91.6 kg)   SpO2 98%   BMI 28.98 kg/m   Wt Readings from Last 3 Encounters:  08/19/18 202 lb (91.6 kg)  08/09/18 206 lb 4 oz (93.6 kg)  04/22/18 214 lb 3.2 oz (97.2 kg)    Physical Exam Vitals signs and nursing note reviewed.  Constitutional:      Appearance: He is well-developed.  HENT:     Head: Atraumatic.     Right Ear: External ear normal.     Left Ear: External ear normal.     Nose: Rhinorrhea present.     Mouth/Throat:     Pharynx: Posterior oropharyngeal erythema present. No  oropharyngeal exudate.  Eyes:     General:        Right eye: Discharge present.        Left eye: Discharge present.    Pupils: Pupils are equal, round, and reactive to light.     Comments: Clear discharge b/l  Neck:     Musculoskeletal: Normal range of motion and neck supple.  Cardiovascular:     Rate and Rhythm: Normal rate and regular rhythm.  Pulmonary:     Effort: Pulmonary effort is normal. No respiratory distress.     Breath sounds: Wheezing (minimal) present. No rales.  Musculoskeletal: Normal range of motion.  Lymphadenopathy:     Cervical: No cervical adenopathy.  Skin:    General: Skin is warm and dry.  Neurological:     Mental Status: He is alert and oriented to person, place, and time.  Psychiatric:        Behavior: Behavior normal.     Results for orders placed or performed in visit on 08/09/18  Comprehensive metabolic panel  Result Value Ref Range   Glucose 252 (H) 65 - 99 mg/dL   BUN 18 8 - 27 mg/dL   Creatinine, Ser 1.02 0.76 -  1.27 mg/dL   GFR calc non Af Amer 76 >59 mL/min/1.73   GFR calc Af Amer 88 >59 mL/min/1.73   BUN/Creatinine Ratio 18 10 - 24   Sodium 143 134 - 144 mmol/L   Potassium 4.4 3.5 - 5.2 mmol/L   Chloride 106 96 - 106 mmol/L   CO2 22 20 - 29 mmol/L   Calcium 8.1 (L) 8.6 - 10.2 mg/dL   Total Protein 5.0 (L) 6.0 - 8.5 g/dL   Albumin 3.3 (L) 3.8 - 4.8 g/dL   Globulin, Total 1.7 1.5 - 4.5 g/dL   Albumin/Globulin Ratio 1.9 1.2 - 2.2   Bilirubin Total 0.3 0.0 - 1.2 mg/dL   Alkaline Phosphatase 73 39 - 117 IU/L   AST 52 (H) 0 - 40 IU/L   ALT 63 (H) 0 - 44 IU/L  B Nat Peptide  Result Value Ref Range   BNP 27.1 0.0 - 100.0 pg/mL      Assessment & Plan:   Problem List Items Addressed This Visit      Respiratory   Allergic rhinitis - Primary    No evidence of bacterial infection, increase antihistamine to BID, flonase BID, and add singulair, humidifier, sinus rinses       Other Visit Diagnoses    Wheezing       Symbicort sample  given with instructions. Cough syrup prn. F/u if worsening       Follow up plan: Return for as scheduled.

## 2018-08-31 ENCOUNTER — Other Ambulatory Visit: Payer: Self-pay | Admitting: Family Medicine

## 2018-08-31 DIAGNOSIS — I10 Essential (primary) hypertension: Secondary | ICD-10-CM

## 2018-08-31 DIAGNOSIS — E119 Type 2 diabetes mellitus without complications: Secondary | ICD-10-CM

## 2018-08-31 MED ORDER — VALACYCLOVIR HCL 500 MG PO TABS: 500.0000 mg | ORAL_TABLET | Freq: Every day | ORAL | 0 refills | Status: DC

## 2018-08-31 MED ORDER — LISINOPRIL 10 MG PO TABS: 10.0000 mg | ORAL_TABLET | Freq: Every day | ORAL | 0 refills | Status: DC

## 2018-08-31 NOTE — Telephone Encounter (Signed)
Will route to office; unable to order electronically  Requested medication (s) are due for refill today: valcyclovir yes  Requested medication (s) are on the active medication list: yes  Last refill: 07/21/17   Future visit scheduled: yes  Notes to clinic:  Unable to send electronically   Requested Prescriptions  Pending Prescriptions Disp Refills  . valACYclovir (VALTREX) 500 MG tablet 30 tablet 0    Sig: Take 1 tablet (500 mg total) by mouth daily.     There is no refill protocol information for this order    Signed Prescriptions Disp Refills   valACYclovir (VALTREX) 500 MG tablet 90 tablet 0    Sig: Take 1 tablet by mouth once daily     Antimicrobials:  Antiviral Agents - Anti-Herpetic Passed - 08/31/2018 10:14 AM      Passed - Valid encounter within last 12 months    Recent Outpatient Visits          1 week ago Non-seasonal allergic rhinitis due to other allergic trigger   [facility], [place]   3 weeks ago Bilateral leg edema   [place], [place], [place]   7 months ago Essential hypertension   [place] Crissman, Jeannette How, MD   1 year ago Essential hypertension   [place] Crissman, Jeannette How, MD   1 year ago Essential hypertension   [place], Lilia Argue, [place]      Future Appointments            In 1 month Crissman, Jeannette How, MD [place]          lisinopril (PRINIVIL,ZESTRIL) 10 MG tablet 90 tablet 0    Sig: Take 1 tablet by mouth once daily     Cardiovascular:  ACE Inhibitors Passed - 08/31/2018 10:14 AM      Passed - Cr in normal range and within 180 days    Creatinine  Date Value Ref Range Status  07/29/2011 0.99 0.60 - 1.30 mg/dL Final   Creatinine, Ser  Date Value Ref Range Status  08/09/2018 1.02 0.76 - 1.27 mg/dL Final         Passed - K in normal range and within 180 days    Potassium  Date Value Ref Range Status   08/09/2018 4.4 3.5 - 5.2 mmol/L Final  07/29/2011 3.8 3.5 - 5.1 mmol/L Final         Passed - Patient is not pregnant      Passed - Last BP in normal range    BP Readings from Last 1 Encounters:  08/19/18 119/75         Passed - Valid encounter within last 6 months    Recent Outpatient Visits          1 week ago Non-seasonal allergic rhinitis due to other allergic trigger   [facility]ney American, PA-C   3 weeks ago Bilateral leg edema   [facility], [place]   7 months ago Essential hypertension   [place] Crissman, Jeannette How, MD   1 year ago Essential hypertension   [place] Crissman, Jeannette How, MD   1 year ago Essential hypertension   [facility], [place]      Future Appointments            In 1 month Crissman, Jeannette How, MD [facility]  valACYclovir (VALTREX) 500 MG tablet 90 tablet 0    Sig: Take 1 tablet (500 mg total) by mouth daily.     There is no refill protocol information for this order     lisinopril (PRINIVIL,ZESTRIL) 10 MG tablet 90 tablet 0    Sig: Take 1 tablet (10 mg total) by mouth daily.     There is no refill protocol information for this order     valACYclovir (VALTREX) 500 MG tablet 90 tablet 0    Sig: Take 1 tablet (500 mg total) by mouth daily.     There is no refill protocol information for this order

## 2018-08-31 NOTE — Telephone Encounter (Signed)
Requested Prescriptions  Pending Prescriptions Disp Refills  . valACYclovir (VALTREX) 500 MG tablet [Pharmacy Med Name: valACYclovir HCL 500 MG TABLET] 90 tablet 0    Sig: Take 1 tablet by mouth once daily     Antimicrobials:  Antiviral Agents - Anti-Herpetic Passed - 08/31/2018 10:14 AM      Passed - Valid encounter within last 12 months    Recent Outpatient Visits          1 week ago Non-seasonal allergic rhinitis due to other allergic trigger   [facility] [place], [place]   3 weeks ago Bilateral leg edema   [facility] [place], [place]   7 months ago Essential hypertension   [place] Crissman, Jeannette How, [place]   1 year ago Essential hypertension   [place] Crissman, Jeannette How, MD   1 year ago Essential hypertension   [facility] [place], [place]      Future Appointments            In 1 month Crissman, Jeannette How, MD [facility] [place], [place]         . lisinopril (PRINIVIL,ZESTRIL) 10 MG tablet [Pharmacy Med Name: LISINOPRIL 10 MG TABLET] 90 tablet 3    Sig: Take 1 tablet by mouth once daily     Cardiovascular:  ACE Inhibitors Passed - 08/31/2018 10:14 AM      Passed - Cr in normal range and within 180 days    Creatinine  Date Value Ref Range Status  07/29/2011 0.99 0.60 - 1.30 mg/dL Final   Creatinine, Ser  Date Value Ref Range Status  08/09/2018 1.02 0.76 - 1.27 mg/dL Final         Passed - K in normal range and within 180 days    Potassium  Date Value Ref Range Status  08/09/2018 4.4 3.5 - 5.2 mmol/L Final  07/29/2011 3.8 3.5 - 5.1 mmol/L Final         Passed - Patient is not pregnant      Passed - Last BP in normal range    BP Readings from Last 1 Encounters:  08/19/18 119/75         Passed - Valid encounter within last 6 months    Recent Outpatient Visits          1 week ago Non-seasonal allergic rhinitis due to other allergic trigger   [facility], PA-C   3 weeks ago Bilateral leg edema   [facility] [place], [place]   7 months ago Essential hypertension   [place] Crissman, Jeannette How, MD   1 year ago Essential hypertension   [place] Crissman, Jeannette How, MD   1 year ago Essential hypertension   [place], Lilia Argue, [place]      Future Appointments            In 1 month Crissman, Jeannette How, MD [facility]

## 2018-08-31 NOTE — Telephone Encounter (Signed)
Requested Prescriptions  Pending Prescriptions Disp Refills  . valACYclovir (VALTREX) 500 MG tablet 90 tablet 0    Sig: Take 1 tablet (500 mg total) by mouth daily.     There is no refill protocol information for this order    . lisinopril (PRINIVIL,ZESTRIL) 10 MG tablet 90 tablet 0    Sig: Take 1 tablet (10 mg total) by mouth daily.     There is no refill protocol information for this order    Signed Prescriptions Disp Refills   valACYclovir (VALTREX) 500 MG tablet 90 tablet 0    Sig: Take 1 tablet by mouth once daily     Antimicrobials:  Antiviral Agents - Anti-Herpetic Passed - 08/31/2018 10:14 AM      Passed - Valid encounter within last 12 months    Recent Outpatient Visits          1 week ago Non-seasonal allergic rhinitis due to other allergic trigger   [facility] [place], [place]   3 weeks ago Bilateral leg edema   [place], [place], [place]   7 months ago Essential hypertension   [place] Crissman, Jeannette How, MD   1 year ago Essential hypertension   [place] Crissman, Jeannette How, MD   1 year ago Essential hypertension   [place], Lilia Argue, [place]      Future Appointments            In 1 month Crissman, Jeannette How, MD [place], [facility]          lisinopril (PRINIVIL,ZESTRIL) 10 MG tablet 90 tablet 0    Sig: Take 1 tablet by mouth once daily     Cardiovascular:  ACE Inhibitors Passed - 08/31/2018 10:14 AM      Passed - Cr in normal range and within 180 days    Creatinine  Date Value Ref Range Status  07/29/2011 0.99 0.60 - 1.30 mg/dL Final   Creatinine, Ser  Date Value Ref Range Status  08/09/2018 1.02 0.76 - 1.27 mg/dL Final         Passed - K in normal range and within 180 days    Potassium  Date Value Ref Range Status  08/09/2018 4.4 3.5 - 5.2 mmol/L Final  07/29/2011 3.8 3.5 - 5.1 mmol/L Final         Passed - Patient is not pregnant      Passed - Last BP in normal range    BP Readings from Last 1 Encounters:  08/19/18 119/75         Passed - Valid encounter within last 6 months    Recent Outpatient Visits          1 week ago Non-seasonal allergic rhinitis due to other allergic trigger   [facility] [place], PA-C   3 weeks ago Bilateral leg edema   [facility] [place], [place]   7 months ago Essential hypertension   [place] Crissman, Jeannette How, MD   1 year ago Essential hypertension   [place] Crissman, Jeannette How, MD   1 year ago Essential hypertension   [facility] [place], [place]      Future Appointments            In 1 month Crissman, Jeannette How, MD [facility], [facility]

## 2018-08-31 NOTE — Addendum Note (Signed)
Addended by: Addison Naegeli on: 08/31/2018 10:37 AM   Modules accepted: Orders

## 2018-09-06 MED ORDER — ATORVASTATIN CALCIUM 40 MG PO TABS: 40.0000 mg | ORAL_TABLET | Freq: Every day | ORAL | 0 refills | Status: DC

## 2018-09-06 MED ORDER — METFORMIN HCL 500 MG PO TABS: 500.0000 mg | ORAL_TABLET | Freq: Two times a day (BID) | ORAL | 0 refills | Status: DC

## 2018-09-06 MED ORDER — LISINOPRIL 10 MG PO TABS: 10.0000 mg | ORAL_TABLET | Freq: Every day | ORAL | 0 refills | Status: DC

## 2018-09-06 MED ORDER — VALACYCLOVIR HCL 500 MG PO TABS: 500.0000 mg | ORAL_TABLET | Freq: Every day | ORAL | 0 refills | Status: DC

## 2018-09-06 NOTE — Telephone Encounter (Signed)
Lisinopril and valacyclovir orders were placed 08/31/2018 for [place] but have not been sent to the pharmacy. Both say print.  Pt also needing metformin 500mg  and atorvastatin 40 mg.  [facility]) - [place], [place] [phone] (Phone) [phone] (Fax)

## 2018-09-06 NOTE — Addendum Note (Signed)
Addended by: Dimple Nanas on: 09/06/2018 11:26 AM   Modules accepted: Orders

## 2018-09-06 NOTE — Telephone Encounter (Signed)
Please see note below. 

## 2018-09-17 DIAGNOSIS — R4781 Slurred speech: Secondary | ICD-10-CM | POA: Diagnosis not present

## 2018-09-17 DIAGNOSIS — Z041 Encounter for examination and observation following transport accident: Secondary | ICD-10-CM | POA: Diagnosis not present

## 2018-09-17 DIAGNOSIS — R4182 Altered mental status, unspecified: Secondary | ICD-10-CM | POA: Diagnosis not present

## 2018-09-17 DIAGNOSIS — R29818 Other symptoms and signs involving the nervous system: Secondary | ICD-10-CM | POA: Diagnosis not present

## 2018-09-17 DIAGNOSIS — E1165 Type 2 diabetes mellitus with hyperglycemia: Secondary | ICD-10-CM | POA: Diagnosis not present

## 2018-09-17 DIAGNOSIS — G459 Transient cerebral ischemic attack, unspecified: Secondary | ICD-10-CM | POA: Diagnosis not present

## 2018-09-17 DIAGNOSIS — I10 Essential (primary) hypertension: Secondary | ICD-10-CM | POA: Diagnosis not present

## 2018-09-17 DIAGNOSIS — R404 Transient alteration of awareness: Secondary | ICD-10-CM | POA: Diagnosis not present

## 2018-09-18 ENCOUNTER — Emergency Department (HOSPITAL_COMMUNITY): Payer: PPO

## 2018-09-18 ENCOUNTER — Other Ambulatory Visit: Payer: Self-pay

## 2018-09-18 ENCOUNTER — Inpatient Hospital Stay (HOSPITAL_COMMUNITY)
Admission: EM | Admit: 2018-09-18 | Discharge: 2018-09-20 | DRG: 040 | Disposition: A | Discharge status: Home / Self Care | Payer: PPO | Attending: Internal Medicine | Admitting: Internal Medicine

## 2018-09-18 ENCOUNTER — Observation Stay (HOSPITAL_COMMUNITY): Payer: PPO

## 2018-09-18 ENCOUNTER — Encounter (HOSPITAL_COMMUNITY): Payer: Self-pay

## 2018-09-18 DIAGNOSIS — J45909 Unspecified asthma, uncomplicated: Secondary | ICD-10-CM | POA: Diagnosis present

## 2018-09-18 DIAGNOSIS — I10 Essential (primary) hypertension: Secondary | ICD-10-CM

## 2018-09-18 DIAGNOSIS — I63412 Cerebral infarction due to embolism of left middle cerebral artery: Secondary | ICD-10-CM | POA: Diagnosis present

## 2018-09-18 DIAGNOSIS — Z881 Allergy status to other antibiotic agents status: Secondary | ICD-10-CM

## 2018-09-18 DIAGNOSIS — E785 Hyperlipidemia, unspecified: Secondary | ICD-10-CM | POA: Diagnosis present

## 2018-09-18 DIAGNOSIS — R29700 NIHSS score 0: Secondary | ICD-10-CM | POA: Diagnosis present

## 2018-09-18 DIAGNOSIS — Z66 Do not resuscitate: Secondary | ICD-10-CM | POA: Diagnosis present

## 2018-09-18 DIAGNOSIS — I2510 Atherosclerotic heart disease of native coronary artery without angina pectoris: Secondary | ICD-10-CM | POA: Diagnosis present

## 2018-09-18 DIAGNOSIS — I440 Atrioventricular block, first degree: Secondary | ICD-10-CM

## 2018-09-18 DIAGNOSIS — I351 Nonrheumatic aortic (valve) insufficiency: Secondary | ICD-10-CM | POA: Diagnosis present

## 2018-09-18 DIAGNOSIS — I872 Venous insufficiency (chronic) (peripheral): Secondary | ICD-10-CM | POA: Diagnosis present

## 2018-09-18 DIAGNOSIS — I340 Nonrheumatic mitral (valve) insufficiency: Secondary | ICD-10-CM | POA: Diagnosis not present

## 2018-09-18 DIAGNOSIS — G936 Cerebral edema: Secondary | ICD-10-CM | POA: Diagnosis present

## 2018-09-18 DIAGNOSIS — I63432 Cerebral infarction due to embolism of left posterior cerebral artery: Secondary | ICD-10-CM | POA: Diagnosis present

## 2018-09-18 DIAGNOSIS — Z87891 Personal history of nicotine dependence: Secondary | ICD-10-CM | POA: Diagnosis not present

## 2018-09-18 DIAGNOSIS — Z79899 Other long term (current) drug therapy: Secondary | ICD-10-CM

## 2018-09-18 DIAGNOSIS — Z9889 Other specified postprocedural states: Secondary | ICD-10-CM

## 2018-09-18 DIAGNOSIS — Z9884 Bariatric surgery status: Secondary | ICD-10-CM | POA: Diagnosis not present

## 2018-09-18 DIAGNOSIS — I6300 Cerebral infarction due to thrombosis of unspecified precerebral artery: Secondary | ICD-10-CM | POA: Diagnosis not present

## 2018-09-18 DIAGNOSIS — D473 Essential (hemorrhagic) thrombocythemia: Secondary | ICD-10-CM | POA: Diagnosis present

## 2018-09-18 DIAGNOSIS — Z955 Presence of coronary angioplasty implant and graft: Secondary | ICD-10-CM | POA: Diagnosis not present

## 2018-09-18 DIAGNOSIS — I6389 Other cerebral infarction: Secondary | ICD-10-CM | POA: Diagnosis not present

## 2018-09-18 DIAGNOSIS — C61 Malignant neoplasm of prostate: Secondary | ICD-10-CM | POA: Diagnosis present

## 2018-09-18 DIAGNOSIS — Z803 Family history of malignant neoplasm of breast: Secondary | ICD-10-CM

## 2018-09-18 DIAGNOSIS — E119 Type 2 diabetes mellitus without complications: Secondary | ICD-10-CM | POA: Diagnosis not present

## 2018-09-18 DIAGNOSIS — G8929 Other chronic pain: Secondary | ICD-10-CM | POA: Diagnosis present

## 2018-09-18 DIAGNOSIS — Z7902 Long term (current) use of antithrombotics/antiplatelets: Secondary | ICD-10-CM | POA: Diagnosis not present

## 2018-09-18 DIAGNOSIS — Z96653 Presence of artificial knee joint, bilateral: Secondary | ICD-10-CM | POA: Diagnosis present

## 2018-09-18 DIAGNOSIS — R51 Headache: Secondary | ICD-10-CM | POA: Diagnosis present

## 2018-09-18 DIAGNOSIS — Z833 Family history of diabetes mellitus: Secondary | ICD-10-CM

## 2018-09-18 DIAGNOSIS — Z7982 Long term (current) use of aspirin: Secondary | ICD-10-CM | POA: Diagnosis not present

## 2018-09-18 DIAGNOSIS — K219 Gastro-esophageal reflux disease without esophagitis: Secondary | ICD-10-CM | POA: Diagnosis present

## 2018-09-18 DIAGNOSIS — I361 Nonrheumatic tricuspid (valve) insufficiency: Secondary | ICD-10-CM | POA: Diagnosis not present

## 2018-09-18 DIAGNOSIS — Z8349 Family history of other endocrine, nutritional and metabolic diseases: Secondary | ICD-10-CM

## 2018-09-18 DIAGNOSIS — Z823 Family history of stroke: Secondary | ICD-10-CM

## 2018-09-18 DIAGNOSIS — R42 Dizziness and giddiness: Secondary | ICD-10-CM | POA: Diagnosis not present

## 2018-09-18 DIAGNOSIS — I639 Cerebral infarction, unspecified: Secondary | ICD-10-CM | POA: Diagnosis not present

## 2018-09-18 DIAGNOSIS — Z7984 Long term (current) use of oral hypoglycemic drugs: Secondary | ICD-10-CM | POA: Diagnosis not present

## 2018-09-18 DIAGNOSIS — Z8614 Personal history of Methicillin resistant Staphylococcus aureus infection: Secondary | ICD-10-CM

## 2018-09-18 DIAGNOSIS — Z8249 Family history of ischemic heart disease and other diseases of the circulatory system: Secondary | ICD-10-CM

## 2018-09-18 DIAGNOSIS — E1165 Type 2 diabetes mellitus with hyperglycemia: Secondary | ICD-10-CM | POA: Diagnosis present

## 2018-09-18 DIAGNOSIS — I080 Rheumatic disorders of both mitral and aortic valves: Secondary | ICD-10-CM | POA: Diagnosis not present

## 2018-09-18 DIAGNOSIS — I63512 Cerebral infarction due to unspecified occlusion or stenosis of left middle cerebral artery: Secondary | ICD-10-CM

## 2018-09-18 DIAGNOSIS — Z800 Family history of malignant neoplasm of digestive organs: Secondary | ICD-10-CM

## 2018-09-18 DIAGNOSIS — Z882 Allergy status to sulfonamides status: Secondary | ICD-10-CM

## 2018-09-18 DIAGNOSIS — G4730 Sleep apnea, unspecified: Secondary | ICD-10-CM | POA: Diagnosis present

## 2018-09-18 DIAGNOSIS — L731 Pseudofolliculitis barbae: Secondary | ICD-10-CM | POA: Diagnosis not present

## 2018-09-18 DIAGNOSIS — Z888 Allergy status to other drugs, medicaments and biological substances status: Secondary | ICD-10-CM

## 2018-09-18 DIAGNOSIS — Z96611 Presence of right artificial shoulder joint: Secondary | ICD-10-CM | POA: Diagnosis present

## 2018-09-18 LAB — CBC WITH DIFFERENTIAL/PLATELET
Abs Immature Granulocytes: 0.01 10*3/uL (ref 0.00–0.07)
BASOS PCT: 1 %
Basophils Absolute: 0 10*3/uL (ref 0.0–0.1)
Eosinophils Absolute: 0.1 10*3/uL (ref 0.0–0.5)
Eosinophils Relative: 2 %
HCT: 43.7 % (ref 39.0–52.0)
Hemoglobin: 13.4 g/dL (ref 13.0–17.0)
Immature Granulocytes: 0 %
Lymphocytes Relative: 22 %
Lymphs Abs: 1.3 10*3/uL (ref 0.7–4.0)
MCH: 30 pg (ref 26.0–34.0)
MCHC: 30.7 g/dL (ref 30.0–36.0)
MCV: 98 fL (ref 80.0–100.0)
Monocytes Absolute: 0.5 10*3/uL (ref 0.1–1.0)
Monocytes Relative: 8 %
Neutro Abs: 3.8 10*3/uL (ref 1.7–7.7)
Neutrophils Relative %: 67 %
PLATELETS: 136 10*3/uL — AB (ref 150–400)
RBC: 4.46 MIL/uL (ref 4.22–5.81)
RDW: 12.6 % (ref 11.5–15.5)
WBC: 5.7 10*3/uL (ref 4.0–10.5)
nRBC: 0 % (ref 0.0–0.2)

## 2018-09-18 LAB — BASIC METABOLIC PANEL
Anion gap: 8 (ref 5–15)
BUN: 14 mg/dL (ref 8–23)
CO2: 24 mmol/L (ref 22–32)
Calcium: 9 mg/dL (ref 8.9–10.3)
Chloride: 105 mmol/L (ref 98–111)
Creatinine, Ser: 1 mg/dL (ref 0.61–1.24)
GFR calc Af Amer: >60 mL/min (ref >60)
GFR calc non Af Amer: >60 mL/min (ref >60)
Glucose, Bld: 217 mg/dL — ABNORMAL HIGH (ref 70–99)
Potassium: 4.2 mmol/L (ref 3.5–5.1)
Sodium: 137 mmol/L (ref 135–145)

## 2018-09-18 LAB — PROTIME-INR
INR: 1.1 (ref 0.8–1.2)
PROTHROMBIN TIME: 13.9 s (ref 11.4–15.2)

## 2018-09-18 LAB — CBG MONITORING, ED: Glucose-Capillary: 217 mg/dL — ABNORMAL HIGH (ref 70–99)

## 2018-09-18 LAB — APTT: aPTT: 30 seconds (ref 24–36)

## 2018-09-18 MED ORDER — SODIUM CHLORIDE 0.9 % IV BOLUS
1000.0000 mL | Freq: Once | INTRAVENOUS | Status: AC
  Administered 2018-09-18: 1000 mL via INTRAVENOUS

## 2018-09-18 MED ORDER — VITAMIN D 25 MCG (1000 UNIT) PO TABS
2000.0000 [IU] | ORAL_TABLET | Freq: Every day | ORAL | Status: DC
  Administered 2018-09-19 – 2018-09-20 (×2): 2000 [IU] via ORAL
  Filled 2018-09-18 (×2): qty 2

## 2018-09-18 MED ORDER — ACETAMINOPHEN 325 MG PO TABS
650.0000 mg | ORAL_TABLET | Freq: Once | ORAL | Status: AC
  Administered 2018-09-18: 650 mg via ORAL
  Filled 2018-09-18: qty 2

## 2018-09-18 MED ORDER — IOHEXOL 350 MG/ML SOLN
75.0000 mL | Freq: Once | INTRAVENOUS | Status: AC | PRN
  Administered 2018-09-18: 75 mL via INTRAVENOUS

## 2018-09-18 MED ORDER — ASPIRIN 81 MG PO CHEW
81.0000 mg | CHEWABLE_TABLET | Freq: Every day | ORAL | Status: DC
  Administered 2018-09-18 – 2018-09-20 (×3): 81 mg via ORAL
  Filled 2018-09-18 (×3): qty 1

## 2018-09-18 MED ORDER — SENNOSIDES-DOCUSATE SODIUM 8.6-50 MG PO TABS: 1.0000 | ORAL_TABLET | Freq: Every evening | ORAL | Status: DC | PRN

## 2018-09-18 MED ORDER — CLOPIDOGREL BISULFATE 75 MG PO TABS
75.0000 mg | ORAL_TABLET | Freq: Every day | ORAL | Status: DC
  Administered 2018-09-18 – 2018-09-20 (×3): 75 mg via ORAL
  Filled 2018-09-18 (×3): qty 1

## 2018-09-18 MED ORDER — STROKE: EARLY STAGES OF RECOVERY BOOK
Freq: Once | Status: AC
  Administered 2018-09-18: 19:00:00

## 2018-09-18 MED ORDER — ACETAMINOPHEN 325 MG PO TABS: 650.0000 mg | ORAL_TABLET | ORAL | Status: DC | PRN

## 2018-09-18 MED ORDER — METFORMIN HCL 500 MG PO TABS
500.0000 mg | ORAL_TABLET | Freq: Two times a day (BID) | ORAL | Status: DC
  Administered 2018-09-19 – 2018-09-20 (×3): 500 mg via ORAL
  Filled 2018-09-18 (×3): qty 1

## 2018-09-18 MED ORDER — ACETAMINOPHEN 160 MG/5ML PO SOLN: 650.0000 mg | ORAL | Status: DC | PRN

## 2018-09-18 MED ORDER — PROCHLORPERAZINE EDISYLATE 10 MG/2ML IJ SOLN
10.0000 mg | Freq: Once | INTRAMUSCULAR | Status: AC
  Administered 2018-09-18: 10 mg via INTRAVENOUS
  Filled 2018-09-18: qty 2

## 2018-09-18 MED ORDER — ACETAMINOPHEN 650 MG RE SUPP: 650.0000 mg | RECTAL | Status: DC | PRN

## 2018-09-18 MED ORDER — ATORVASTATIN CALCIUM 40 MG PO TABS
40.0000 mg | ORAL_TABLET | Freq: Every day | ORAL | Status: DC
  Administered 2018-09-19 – 2018-09-20 (×2): 40 mg via ORAL
  Filled 2018-09-18 (×2): qty 1

## 2018-09-18 NOTE — ED Notes (Signed)
Patient transported to CT 
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
## 2018-09-18 NOTE — ED Provider Notes (Signed)
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 inherent limitations of voice recognition software.   Final Clinical Impressions(s) / ED Diagnoses   Final diagnoses:  Cerebrovascular accident (CVA), unspecified mechanism [facility])    ED Discharge Orders    None       Flint Melter 09/18/18 1553    Hayden Rasmussen, MD 09/18/18 1727
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
## 2018-09-18 NOTE — ED Notes (Signed)
Called lab.  They have just received blood.
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
## 2018-09-19 ENCOUNTER — Observation Stay (HOSPITAL_COMMUNITY): Payer: PPO

## 2018-09-19 DIAGNOSIS — D473 Essential (hemorrhagic) thrombocythemia: Secondary | ICD-10-CM | POA: Diagnosis present

## 2018-09-19 DIAGNOSIS — E119 Type 2 diabetes mellitus without complications: Secondary | ICD-10-CM | POA: Diagnosis not present

## 2018-09-19 DIAGNOSIS — E1165 Type 2 diabetes mellitus with hyperglycemia: Secondary | ICD-10-CM | POA: Diagnosis present

## 2018-09-19 DIAGNOSIS — I872 Venous insufficiency (chronic) (peripheral): Secondary | ICD-10-CM | POA: Diagnosis present

## 2018-09-19 DIAGNOSIS — R51 Headache: Secondary | ICD-10-CM | POA: Diagnosis present

## 2018-09-19 DIAGNOSIS — I63412 Cerebral infarction due to embolism of left middle cerebral artery: Secondary | ICD-10-CM | POA: Diagnosis present

## 2018-09-19 DIAGNOSIS — I340 Nonrheumatic mitral (valve) insufficiency: Secondary | ICD-10-CM | POA: Diagnosis not present

## 2018-09-19 DIAGNOSIS — I6389 Other cerebral infarction: Secondary | ICD-10-CM | POA: Diagnosis not present

## 2018-09-19 DIAGNOSIS — Z79899 Other long term (current) drug therapy: Secondary | ICD-10-CM | POA: Diagnosis not present

## 2018-09-19 DIAGNOSIS — I440 Atrioventricular block, first degree: Secondary | ICD-10-CM | POA: Diagnosis present

## 2018-09-19 DIAGNOSIS — G8929 Other chronic pain: Secondary | ICD-10-CM | POA: Diagnosis present

## 2018-09-19 DIAGNOSIS — I6300 Cerebral infarction due to thrombosis of unspecified precerebral artery: Secondary | ICD-10-CM | POA: Diagnosis not present

## 2018-09-19 DIAGNOSIS — E785 Hyperlipidemia, unspecified: Secondary | ICD-10-CM | POA: Diagnosis present

## 2018-09-19 DIAGNOSIS — I361 Nonrheumatic tricuspid (valve) insufficiency: Secondary | ICD-10-CM | POA: Diagnosis not present

## 2018-09-19 DIAGNOSIS — Z955 Presence of coronary angioplasty implant and graft: Secondary | ICD-10-CM | POA: Diagnosis not present

## 2018-09-19 DIAGNOSIS — I10 Essential (primary) hypertension: Secondary | ICD-10-CM | POA: Diagnosis present

## 2018-09-19 DIAGNOSIS — I63512 Cerebral infarction due to unspecified occlusion or stenosis of left middle cerebral artery: Secondary | ICD-10-CM | POA: Diagnosis not present

## 2018-09-19 DIAGNOSIS — I2510 Atherosclerotic heart disease of native coronary artery without angina pectoris: Secondary | ICD-10-CM | POA: Diagnosis present

## 2018-09-19 DIAGNOSIS — L731 Pseudofolliculitis barbae: Secondary | ICD-10-CM | POA: Diagnosis not present

## 2018-09-19 DIAGNOSIS — I639 Cerebral infarction, unspecified: Secondary | ICD-10-CM | POA: Diagnosis not present

## 2018-09-19 DIAGNOSIS — C61 Malignant neoplasm of prostate: Secondary | ICD-10-CM | POA: Diagnosis present

## 2018-09-19 DIAGNOSIS — G936 Cerebral edema: Secondary | ICD-10-CM | POA: Diagnosis present

## 2018-09-19 DIAGNOSIS — R29700 NIHSS score 0: Secondary | ICD-10-CM | POA: Diagnosis present

## 2018-09-19 DIAGNOSIS — I63432 Cerebral infarction due to embolism of left posterior cerebral artery: Secondary | ICD-10-CM | POA: Diagnosis present

## 2018-09-19 DIAGNOSIS — I351 Nonrheumatic aortic (valve) insufficiency: Secondary | ICD-10-CM | POA: Diagnosis present

## 2018-09-19 DIAGNOSIS — J45909 Unspecified asthma, uncomplicated: Secondary | ICD-10-CM | POA: Diagnosis present

## 2018-09-19 DIAGNOSIS — K219 Gastro-esophageal reflux disease without esophagitis: Secondary | ICD-10-CM | POA: Diagnosis present

## 2018-09-19 DIAGNOSIS — G4730 Sleep apnea, unspecified: Secondary | ICD-10-CM | POA: Diagnosis present

## 2018-09-19 DIAGNOSIS — Z9884 Bariatric surgery status: Secondary | ICD-10-CM | POA: Diagnosis not present

## 2018-09-19 DIAGNOSIS — Z7984 Long term (current) use of oral hypoglycemic drugs: Secondary | ICD-10-CM | POA: Diagnosis not present

## 2018-09-19 DIAGNOSIS — Z882 Allergy status to sulfonamides status: Secondary | ICD-10-CM | POA: Diagnosis not present

## 2018-09-19 DIAGNOSIS — Z96653 Presence of artificial knee joint, bilateral: Secondary | ICD-10-CM | POA: Diagnosis present

## 2018-09-19 DIAGNOSIS — Z87891 Personal history of nicotine dependence: Secondary | ICD-10-CM | POA: Diagnosis not present

## 2018-09-19 LAB — ECHOCARDIOGRAM COMPLETE
Height: 70 in
Weight: 3121.71 oz

## 2018-09-19 LAB — RAPID URINE DRUG SCREEN, HOSP PERFORMED
Amphetamines: NOT DETECTED
Barbiturates: NOT DETECTED
Benzodiazepines: NOT DETECTED
Cocaine: NOT DETECTED
Opiates: NOT DETECTED
TETRAHYDROCANNABINOL: NOT DETECTED

## 2018-09-19 LAB — LIPID PANEL
Cholesterol: 92 mg/dL (ref 0–200)
HDL: 44 mg/dL (ref >40)
LDL Cholesterol: 27 mg/dL (ref 0–99)
Total CHOL/HDL Ratio: 2.1 RATIO
Triglycerides: 103 mg/dL (ref <150)
VLDL: 21 mg/dL (ref 0–40)

## 2018-09-19 LAB — GLUCOSE, CAPILLARY
GLUCOSE-CAPILLARY: 147 mg/dL — AB (ref 70–99)
Glucose-Capillary: 102 mg/dL — ABNORMAL HIGH (ref 70–99)

## 2018-09-19 LAB — HEMOGLOBIN A1C
Hgb A1c MFr Bld: 7.8 % — ABNORMAL HIGH (ref 4.8–5.6)
Mean Plasma Glucose: 177.16 mg/dL

## 2018-09-19 LAB — HIV ANTIBODY (ROUTINE TESTING W REFLEX): HIV Screen 4th Generation wRfx: NONREACTIVE
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
## 2018-09-19 NOTE — Plan of Care (Signed)
Progressing towards goal

## 2018-09-19 NOTE — Progress Notes (Signed)
Medicine attending: I examined this patient today together with resident physician Dr Linna Hoff and I concur with her evaluation and management plan. Stable overnight.  Neurologic deficits not detectable on admission and he remains neurologically stable. MRI confirms findings seen on CT angiogram with areas of infarction in the distribution of the left MCA and PCA in a watershed pattern. Echocardiogram done.  Report result pending. We appreciate neurology consultation. Pattern of infarcts felt to be compatible with emboli. Recommendation is dual antiplatelet agents for 3 weeks then aspirin alone. Investigate further for potential source of emboli.  TTE echo result pending.  Schedule TEE and loop recorder placement. Patient offered participation in a stroke prevention trial and will consider.
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
## 2018-09-19 NOTE — Progress Notes (Signed)
  Echocardiogram 2D Echocardiogram has been performed.  Darlina Sicilian M 09/19/2018, 11:06 AM
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
## 2018-09-19 NOTE — H&P (View-Only) (Signed)
Medicine attending: I examined this patient today together with resident physician Dr Linna Hoff and I concur with her evaluation and management plan. Stable overnight.  Neurologic deficits not detectable on admission and he remains neurologically stable. MRI confirms findings seen on CT angiogram with areas of infarction in the distribution of the left MCA and PCA in a watershed pattern. Echocardiogram done.  Report result pending. We appreciate neurology consultation. Pattern of infarcts felt to be compatible with emboli. Recommendation is dual antiplatelet agents for 3 weeks then aspirin alone. Investigate further for potential source of emboli.  TTE echo result pending.  Schedule TEE and loop recorder placement. Patient offered participation in a stroke prevention trial and will consider.

## 2018-09-19 NOTE — Plan of Care (Signed)
  Problem: Education: Goal: Knowledge of disease or condition will improve Outcome: Progressing   Problem: Education: Goal: Knowledge of secondary prevention will improve Outcome: Progressing   Problem: Coping: Goal: Will verbalize positive feelings about self Outcome: Progressing

## 2018-09-19 NOTE — Progress Notes (Signed)
Pt uses own home CPAP at night. Safety education given  Gait slightly unsteady but pt not compliance with safety measures  Bed alarm in use and education re enforce for pt to call for assist before getting oob Pt verbalized some understanding.[name] Kitchen
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
## 2018-09-19 NOTE — CV Procedure (Signed)
Echocardiogram not completed at this time due to other therapies being completed. I will re-attempt the echo later in the day.  Darlina Sicilian RDCS

## 2018-09-19 NOTE — Progress Notes (Signed)
  Speech Language Pathology:    Patient Details Name: Andrew Cobb MRN: [id number] DOB: November 02, 1950 Today's Date: 09/19/2018 Time: 3159-4585 SLP Time Calculation (min) (ACUTE ONLY): 28 min  Assessment / Plan / Recommendation   ST follow up to meet with the patient and his wife with OT present as well to discuss follow up rehab services as an outpatient to address memory and functional cognitive skills.  The patient's wife is worried about the potential co-pays for such services but is agreeable if they can afford the services.  ST will sign off at this time.  If we can be of further assistance please feel free to re-consult.    Shelly Flatten, MA, [facility] SLP [phone]  Lamar Sprinkles 09/19/2018, 10:57 AM

## 2018-09-20 ENCOUNTER — Encounter (HOSPITAL_COMMUNITY)
Admission: EM | Disposition: A | Discharge status: Home / Self Care | Payer: Self-pay | Source: Home / Self Care | Attending: Oncology

## 2018-09-20 ENCOUNTER — Inpatient Hospital Stay (HOSPITAL_COMMUNITY): Payer: PPO | Admitting: Anesthesiology

## 2018-09-20 ENCOUNTER — Inpatient Hospital Stay (HOSPITAL_COMMUNITY): Payer: PPO

## 2018-09-20 ENCOUNTER — Encounter (HOSPITAL_COMMUNITY): Payer: Self-pay | Admitting: *Deleted

## 2018-09-20 DIAGNOSIS — L731 Pseudofolliculitis barbae: Secondary | ICD-10-CM

## 2018-09-20 DIAGNOSIS — Z7902 Long term (current) use of antithrombotics/antiplatelets: Secondary | ICD-10-CM

## 2018-09-20 DIAGNOSIS — I351 Nonrheumatic aortic (valve) insufficiency: Secondary | ICD-10-CM

## 2018-09-20 DIAGNOSIS — Z7982 Long term (current) use of aspirin: Secondary | ICD-10-CM

## 2018-09-20 DIAGNOSIS — I6389 Other cerebral infarction: Secondary | ICD-10-CM

## 2018-09-20 DIAGNOSIS — I340 Nonrheumatic mitral (valve) insufficiency: Secondary | ICD-10-CM

## 2018-09-20 HISTORY — PX: TEE WITHOUT CARDIOVERSION: SHX5443

## 2018-09-20 HISTORY — PX: LOOP RECORDER INSERTION: EP1214

## 2018-09-20 HISTORY — PX: BUBBLE STUDY: SHX6837

## 2018-09-20 LAB — GLUCOSE, CAPILLARY
Glucose-Capillary: 117 mg/dL — ABNORMAL HIGH (ref 70–99)
Glucose-Capillary: 123 mg/dL — ABNORMAL HIGH (ref 70–99)
Glucose-Capillary: 261 mg/dL — ABNORMAL HIGH (ref 70–99)

## 2018-09-20 SURGERY — ECHOCARDIOGRAM, TRANSESOPHAGEAL
Anesthesia: Monitor Anesthesia Care

## 2018-09-20 SURGERY — LOOP RECORDER INSERTION

## 2018-09-20 MED ORDER — LIDOCAINE-EPINEPHRINE 1 %-1:100000 IJ SOLN
INTRAMUSCULAR | Status: AC
  Filled 2018-09-20: qty 1

## 2018-09-20 MED ORDER — PROPOFOL 10 MG/ML IV BOLUS
INTRAVENOUS | Status: DC | PRN
  Administered 2018-09-20: 20 mg via INTRAVENOUS
  Administered 2018-09-20: 30 mg via INTRAVENOUS

## 2018-09-20 MED ORDER — CLOPIDOGREL BISULFATE 75 MG PO TABS: 75.0000 mg | ORAL_TABLET | Freq: Every day | ORAL | 0 refills | Status: DC

## 2018-09-20 MED ORDER — SODIUM CHLORIDE 0.9 % IV SOLN
INTRAVENOUS | Status: DC
  Administered 2018-09-20: 13:00:00 via INTRAVENOUS

## 2018-09-20 MED ORDER — LIDOCAINE-EPINEPHRINE 1 %-1:100000 IJ SOLN
INTRAMUSCULAR | Status: DC | PRN
  Administered 2018-09-20: 20 mL

## 2018-09-20 MED ORDER — ASPIRIN 81 MG PO CHEW: 81.0000 mg | CHEWABLE_TABLET | Freq: Every day | ORAL | 1 refills | Status: DC

## 2018-09-20 MED ORDER — PROPOFOL 500 MG/50ML IV EMUL
INTRAVENOUS | Status: DC | PRN
  Administered 2018-09-20: 100 ug/kg/min via INTRAVENOUS

## 2018-09-20 MED ORDER — LIDOCAINE 2% (20 MG/ML) 5 ML SYRINGE
INTRAMUSCULAR | Status: DC | PRN
  Administered 2018-09-20: 100 mg via INTRAVENOUS

## 2018-09-20 SURGICAL SUPPLY — 2 items
LOOP REVEAL LINQSYS (Prosthesis & Implant Heart) ×1 IMPLANT
PACK LOOP INSERTION (CUSTOM PROCEDURE TRAY) ×2 IMPLANT
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
## 2018-09-20 NOTE — Plan of Care (Signed)
  Problem: Coping: Goal: Will verbalize positive feelings about self Outcome: Progressing   Problem: Education: Goal: Knowledge of secondary prevention will improve Outcome: Progressing   Problem: Self-Care: Goal: Ability to communicate needs accurately will improve Outcome: Progressing

## 2018-09-20 NOTE — Progress Notes (Signed)
Patient is ready for discharge to home; discharge instructions given and reviewed; Rx's sent electronically. Patient and wife have all belongings and the loop recorder equipment. Patient discharged out via wheelchair accompanied home by his wife.

## 2018-09-20 NOTE — Progress Notes (Addendum)
   Post ILR instructions added to AVS. EP scheduler currently unavailable, and Covid 19 may be impacting plans for f/u therefore I have requested the office call him with 10 day wound check. Would also encourage he get back on track with usual cardiology f/u (pt of [facility]) given h/o syncope and CAD.  Glorine Hanratty PA-C

## 2018-09-20 NOTE — CV Procedure (Signed)
TEE: Anesthesia: Propofol  Mild MR Mild AR No LAA thormbus No PFO negative bubble study No source of embolus EF 60% No significant aortic debris Normal RV Lipomatous hypertrophy of atrial septum  Andrew Cobb

## 2018-09-20 NOTE — Anesthesia Postprocedure Evaluation (Signed)
Anesthesia Post Note  Patient: Andrew Cobb  Procedure(s) Performed: TRANSESOPHAGEAL ECHOCARDIOGRAM (TEE) (N/A ) BUBBLE STUDY     Patient location during evaluation: PACU Anesthesia Type: MAC Level of consciousness: awake and alert Pain management: pain level controlled Vital Signs Assessment: post-procedure vital signs reviewed and stable Respiratory status: spontaneous breathing, nonlabored ventilation, respiratory function stable and patient connected to nasal cannula oxygen Cardiovascular status: stable and blood pressure returned to baseline Postop Assessment: no apparent nausea or vomiting Anesthetic complications: no    Last Vitals:  Vitals:   09/20/18 1352 09/20/18 1402  BP: 112/77 135/75  Pulse: (!) 59   Resp: 15 (!) 58  Temp: (!) 36.3 C   SpO2: 97% 95%    Last Pain:  Vitals:   09/20/18 1402  TempSrc:   PainSc: 0-No pain                 Nawaf Strange A.

## 2018-09-20 NOTE — Procedures (Signed)
History: 68 year old male being evaluated for transient alteration of awareness.  Sedation: None  Technique: This is a 21 channel routine scalp EEG performed at the bedside with bipolar and monopolar montages arranged in accordance to the international 10/20 system of electrode placement. One channel was dedicated to EKG recording.    Background: The background consists of intermixed alpha and beta activities. There is a well defined posterior dominant rhythm of 10 Hz that attenuates with eye opening.  There is an increase in delta associated with drowsiness, but sleep is not recorded.  Photic stimulation: Physiologic driving is not performed  EEG Abnormalities: None  Clinical Interpretation: This normal EEG is recorded in the waking and drowsy state. There was no seizure or seizure predisposition recorded on this study. Please note that lack of epileptiform activity on EEG does not preclude the possibility of epilepsy.   Roland Rack, MD [facility] [phone]  If 7pm- 7am, please page neurology on call as listed in [place].
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
## 2018-09-20 NOTE — Progress Notes (Signed)
Pt NPO for TEE to day and he is aware of test.

## 2018-09-20 NOTE — Progress Notes (Signed)
EEG Completed; Results Pending  

## 2018-09-20 NOTE — Anesthesia Postprocedure Evaluation (Signed)
Anesthesia Post Note  Patient: Andrew Cobb  Procedure(s) Performed: TRANSESOPHAGEAL ECHOCARDIOGRAM (TEE) (N/A ) BUBBLE STUDY     Patient location during evaluation: PACU Anesthesia Type: MAC Level of consciousness: awake and alert and oriented Pain management: pain level controlled Vital Signs Assessment: post-procedure vital signs reviewed and stable Respiratory status: spontaneous breathing, nonlabored ventilation and respiratory function stable Cardiovascular status: blood pressure returned to baseline and stable Postop Assessment: no apparent nausea or vomiting Anesthetic complications: no    Last Vitals:  Vitals:   09/20/18 1352 09/20/18 1402  BP: 112/77 135/75  Pulse: (!) 59   Resp: 15 (!) 58  Temp: (!) 36.3 C   SpO2: 97% 95%    Last Pain:  Vitals:   09/20/18 1402  TempSrc:   PainSc: 0-No pain                 Zekiah Caruth A.

## 2018-09-20 NOTE — Transfer of Care (Addendum)
Immediate Anesthesia Transfer of Care Note  Patient: Andrew Cobb  Procedure(s) Performed: TRANSESOPHAGEAL ECHOCARDIOGRAM (TEE) (N/A ) BUBBLE STUDY  Patient Location: Endoscopy Unit  Anesthesia Type:MAC  Level of Consciousness: drowsy  Airway & Oxygen Therapy: Patient Spontanous Breathing and Patient connected to nasal cannula oxygen  Post-op Assessment: Report given to RN and Post -op Vital signs reviewed and stable  Post vital signs: Reviewed and stable  Last Vitals:  Vitals Value Taken Time  BP    Temp    Pulse 58   Resp 14   SpO2 100     Last Pain:  Vitals:   09/20/18 1231  TempSrc: Oral  PainSc: 0-No pain         Complications: No apparent anesthesia complications

## 2018-09-20 NOTE — Discharge Instructions (Signed)
IMPLANTABLE LOOP RECORDER INSERTION INSTRUCTIONS  Keep incision clean and dry for 3 days - no showering during that time. Do not submerge in water (i.e. pools or baths) for 7 days. If you are instructed to avoid these activities for a longer period of time by your stroke team, follow those instructions. You can remove outer dressing 24 hours after implantation. Leave steri-strips (little pieces of tape) on until seen in the office for wound check appointment. They may fall off during this time as well and that is OK. Call the office [phone]) for any concerns of redness, drainage, swelling, or fever.

## 2018-09-20 NOTE — TOC Initial Note (Signed)
Transition of Care [facility]) - Initial/Assessment Note    Patient Details  Name: Andrew Cobb MRN: [id number] Date of Birth: April 14, 1951  [facility]) CM/SW Contact:    Pollie Friar, RN Phone Number: 09/20/2018, 10:13 AM  Clinical Narrative:                 CM consulted for outpatient therapy. CM provided choice and they selected [facility]. Orders in Epic and information on the AVS.  Expected Discharge Plan: OP Rehab Barriers to Discharge: Continued Medical Work up   Patient Goals and CMS Choice     Choice offered to / list presented to : Patient, Spouse  Expected Discharge Plan and Services Expected Discharge Plan: OP Rehab Discharge Planning Services: CM Consult   Living arrangements for the past 2 months: Single Family Home(one level)                          Prior Living Arrangements/Services Living arrangements for the past 2 months: Single Family Home(one level) Lives with:: Spouse Patient language and need for interpreter reviewed:: Yes(no needs) Do you feel safe going back to the place where you live?: Yes      Need for Family Participation in Patient Care: Yes (Comment)(24 hour supervision) Care giver support system in place?: Yes (comment)(wife ) Current home services: DME(cane, walker, shower chair, 3 in 1) Criminal Activity/Legal Involvement Pertinent to Current Situation/Hospitalization: No - Comment as needed  Activities of Daily Living Home Assistive Devices/Equipment: None ADL Screening (condition at time of admission) Patient's cognitive ability adequate to safely complete daily activities?: Yes Is the patient deaf or have difficulty hearing?: Yes Does the patient have difficulty seeing, even when wearing glasses/contacts?: No Does the patient have difficulty concentrating, remembering, or making decisions?: No Patient able to express need for assistance with ADLs?: Yes Does the patient have difficulty dressing or bathing?:  No Independently performs ADLs?: Yes (appropriate for developmental age) Does the patient have difficulty walking or climbing stairs?: No Weakness of Legs: None Weakness of Arms/Hands: None  Permission Sought/Granted   Permission granted to share information with : Yes, Verbal Permission Granted  Share Information with NAME: [facility] outpatient rehab           Emotional Assessment Appearance:: Appears stated age Attitude/Demeanor/Rapport: Engaged Affect (typically observed): Accepting, Appropriate Orientation: : Oriented to Self, Oriented to Place, Oriented to  Time, Oriented to Situation   Psych Involvement: No (comment)  Admission diagnosis:  Cerebrovascular accident (CVA), unspecified mechanism ([place]) [I63.9] Patient Active Problem List   Diagnosis Date Noted  . CVA (cerebral vascular accident) ([place]) 09/18/2018  . Coronary artery disease involving native coronary artery of native heart without angina pectoris   . H/O right coronary artery stent placement   . HTN (hypertension), benign   . Allergic rhinitis 08/19/2018  . Cervical radiculopathy 08/17/2017  . Advanced care planning/counseling discussion 07/21/2017  . Skin lesion of right arm 12/16/2016  . Prostate cancer ([place]) 12/08/2016  . H/O bariatric surgery 07/17/2016  . Essential hypertension 04/24/2015  . Type 2 diabetes mellitus without complication, without long-term current use of insulin ([place]) 04/24/2015  . Chronic sinusitis 08/13/2009  . Hyperlipidemia 04/02/2009  . Coronary atherosclerosis 04/02/2009  . 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 OSA (obstructive sleep apnea) 04/02/2009  . 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), [place] - [address] [place] ([place]) [place] [address] Phone: [phone] Fax: [phone]  [facility]) - [place], [place] [address] Phone: [phone] Fax: [phone]     Social Determinants of Health (SDOH) Interventions    Readmission Risk Interventions 30 Day Unplanned Readmission Risk Score     ED to Hosp-Admission (Current) from 09/18/2018 in [place] [facility]  30 Day Unplanned Readmission Risk Score (%)  10 Filed at 09/20/2018 0801     This score is the patient's risk of an unplanned readmission within 30 days of being discharged (0 -100%). The score is based on dignosis, age, lab data, medications, orders, and past utilization.   Low:  0-14.9   Medium: 15-21.9   High: 22-29.9   Extreme: 30 and above       No flowsheet data found.

## 2018-09-20 NOTE — Consult Note (Addendum)
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 brain and surrounding structures were obtained without intravenous contrast. COMPARISON:  CTA head and neck earlier today. FINDINGS: Brain: Patchy restricted diffusion scattered in the left MCA territory including the left inferior frontal gyrus (series 5, image 77), posterior left insula (image 69), superior left parietal lobe on image 86, and inferior left parietal lobe extending to the left MCA PCA watershed area (same images). Cytotoxic edema with T2 hyperintensity, FLAIR hyperintensity, and also petechial hemorrhage in each of the affected areas (series 14, image 34). No significant mass effect. No contralateral or posterior fossa restricted diffusion. Superimposed Patchy and confluent bilateral cerebral white matter T2 and FLAIR hyperintensity. No other acute or chronic cerebral blood products identified. Chronic lacunar infarct in the medial right lentiform near the posterior limb of the right internal capsule. No midline shift, evidence of mass lesion, ventriculomegaly, or extra-axial  collection. Cervicomedullary junction and pituitary are within normal limits. Vascular: Major intracranial vascular flow voids are preserved. Skull and upper cervical spine: Partially visible  cervical ACDF. Visualized bone marrow signal is within normal limits. Sinuses/Orbits: Trace ethmoid sinus mucosal thickening. Postoperative changes to the globes otherwise negative orbits. Other: Visible internal auditory structures appear normal. Mastoids are clear. Scalp and face soft tissues appear negative. IMPRESSION: 1. Scattered acute Left MCA and Left MCA/PCA watershed infarcts with petechial hemorrhage and cytotoxic edema. No mass effect or malignant hemorrhagic transformation. 2. Advanced underlying cerebral white matter disease is nonspecific but favored due to small vessel ischemia. Small chronic lacunar infarct in the right lentiform. Electronically Signed: By: Genevie Ann M.D. On: 09/18/2018 20:48    Assessment and Plan:   1. Syncope - not certain if this could be explained by CVA. LVEF is normal, telemetry unremarkable, and QTc is normal. Aforementioned loop recorder could also concomitantly monitor for cardiac arrhythmia. Per DMV guidance probably should not drive for 6 months. Discussed with pt.  2. Stroke - EP is asked to weigh in on TEE/loop. No obvious cause has been identified for this patient's stroke and then recurrent worsening. Procedure is scheduled for 10am. After careful review of history and examination, the risks and benefits of transesophageal echocardiogram have been explained including risks of esophageal damage, perforation (1:10,000 risk), bleeding, pharyngeal hematoma as well as other potential complications associated with conscious sedation including aspiration, arrhythmia, respiratory failure and death. I also discussed with him the rationale and process of receiving a loop recorder. Alternatives to treatment were discussed, questions were answered. Patient is willing to proceed. Orders  written. Will confirm definitive plan for loop with EP MD.  3. H/o CAD - no recent instability in symptoms noted. Now on ASA, Plavix, statin. HR in 50s-60s so no BB.  4. Uncontrolled DM -  needs better control, per primary team.  5. HLD - appears controlled.   For questions or updates, please contact [facility] Please consult [email or web address] for contact info under Cardiology/STEMI.    Signed, Charlie Pitter, PA-C  09/20/2018 9:37 AM   EP Attending  Patient seen and examined. Agree with the findings as noted above by Melina Copa, PA-C. The patient has sustained a cryptogenic stroke. I have reviewed the indications including risks/benefits/goals/expectations of insertion of an ILR and he wishes to proceed.  Mikle Bosworth.D.

## 2018-09-20 NOTE — Progress Notes (Signed)
  Echocardiogram Echocardiogram Transesophageal has been performed.  Andrew Cobb 09/20/2018, 1:51 PM

## 2018-09-20 NOTE — Anesthesia Preprocedure Evaluation (Addendum)
Anesthesia Evaluation  Patient identified by MRN, date of birth, ID band Patient awake    Reviewed: Allergy & Precautions, NPO status , Patient's Chart, lab work & pertinent test results  Airway Mallampati: II  TM Distance: >3 FB Neck ROM: Full    Dental no notable dental hx. (+) Teeth Intact   Pulmonary asthma , sleep apnea and Continuous Positive Airway Pressure Ventilation , former smoker,    Pulmonary exam normal breath sounds clear to auscultation       Cardiovascular hypertension, Pt. on medications + CAD and + Cardiac Stents  Normal cardiovascular exam Rhythm:Regular Rate:Normal  Cardiac stent x 2 1996   Neuro/Psych Syncope   Neuromuscular disease CVA, No Residual Symptoms negative psych ROS   GI/Hepatic Neg liver ROS, GERD  Medicated and Controlled,S/P gastric sleeve 2014   Endo/Other  diabetes, Well Controlled, Type 2, Oral Hypoglycemic Agents  Renal/GU negative Renal ROS  negative genitourinary   Musculoskeletal  (+) Arthritis , Osteoarthritis,    Abdominal (+) - obese,   Peds  Hematology Thrombocythemia   Anesthesia Other Findings   Reproductive/Obstetrics                             Anesthesia Physical Anesthesia Plan  ASA: III  Anesthesia Plan: MAC   Post-op Pain Management:    Induction: Intravenous  PONV Risk Score and Plan: 2  Airway Management Planned: Natural Airway and Nasal Cannula  Additional Equipment:   Intra-op Plan:   Post-operative Plan:   Informed Consent: I have reviewed the patients History and Physical, chart, labs and discussed the procedure including the risks, benefits and alternatives for the proposed anesthesia with the patient or authorized representative who has indicated his/her understanding and acceptance.   Patient has DNR.  Discussed DNR with patient and Suspend DNR.   Dental advisory given  Plan Discussed with: CRNA and  Surgeon  Anesthesia Plan Comments:       Anesthesia Quick Evaluation
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
## 2018-09-20 NOTE — Interval H&P Note (Signed)
History and Physical Interval Note:  09/20/2018 1:15 PM  Andrew Cobb  has presented today for surgery, with the diagnosis of stroke.  The various methods of treatment have been discussed with the patient and family. After consideration of risks, benefits and other options for treatment, the patient has consented to  Procedure(s): TRANSESOPHAGEAL ECHOCARDIOGRAM (TEE) (N/A) as a surgical intervention.  The patient's history has been reviewed, patient examined, no change in status, stable for surgery.  I have reviewed the patient's chart and labs.  Questions were answered to the patient's satisfaction.     Jenkins Rouge
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
## 2018-09-21 ENCOUNTER — Encounter (HOSPITAL_COMMUNITY): Payer: Self-pay | Admitting: Internal Medicine

## 2018-09-22 ENCOUNTER — Telehealth: Payer: Self-pay

## 2018-09-22 NOTE — Telephone Encounter (Signed)
Transition Care Management Follow-up Telephone Call  Date of discharge and from where: 09/20/2018  How have you been since you were released from the hospital? "im okay, just have to go to speech therapy and physical therapy"  Any questions or concerns? 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 Yes   Medications obtained and verified? Yes   Any new allergies since your discharge? No   Dietary orders reviewed? Yes  Do you have support at home? Yes   Functional Questionnaire: (I = Independent and D = Dependent) ADLs:   Bathing/Dressing- I  Meal Prep- I  Eating- I  Maintaining continence- I  Transferring/Ambulation- I  Managing Meds- I  Follow up appointments reviewed:   PCP Hospital f/u appt confirmed? Yes  Scheduled to see Wynona Dove on 09/23/2018 @ [facility] f/u appt confirmed? Yes  Scheduled to see wound clinic on 09/30/2018 @ 10:30am.  Are transportation arrangements needed? Yes   If their condition worsens, is the pt aware to call PCP or go to the Emergency Dept.? Yes  Was the patient provided with contact information for the PCP's office or ED? Yes  Was to pt encouraged to call back with questions or concerns? Yes

## 2018-09-22 NOTE — Telephone Encounter (Signed)
I have made the 1st attempt to contact the patient or family member in charge, in order to follow up from recently being discharged from the hospital. I left a message on voicemail but I will make another attempt at a different time.  

## 2018-09-23 ENCOUNTER — Other Ambulatory Visit: Payer: Self-pay

## 2018-09-23 ENCOUNTER — Ambulatory Visit (INDEPENDENT_AMBULATORY_CARE_PROVIDER_SITE_OTHER): Payer: PPO | Admitting: Family Medicine

## 2018-09-23 ENCOUNTER — Encounter: Payer: Self-pay | Admitting: Family Medicine

## 2018-09-23 VITALS — BP 143/87 | HR 58 | Temp 97.6°F | Ht 70.0 in

## 2018-09-23 DIAGNOSIS — E782 Mixed hyperlipidemia: Secondary | ICD-10-CM

## 2018-09-23 DIAGNOSIS — I6310 Cerebral infarction due to embolism of unspecified precerebral artery: Secondary | ICD-10-CM | POA: Diagnosis not present

## 2018-09-23 DIAGNOSIS — L02413 Cutaneous abscess of right upper limb: Secondary | ICD-10-CM | POA: Diagnosis not present

## 2018-09-23 DIAGNOSIS — I10 Essential (primary) hypertension: Secondary | ICD-10-CM | POA: Diagnosis not present

## 2018-09-23 MED ORDER — CLINDAMYCIN HCL 300 MG PO CAPS: 300.0000 mg | ORAL_CAPSULE | Freq: Two times a day (BID) | ORAL | 0 refills | Status: DC

## 2018-09-23 NOTE — Progress Notes (Signed)
BP (!) 143/87   Pulse (!) 58   Temp 97.6 F (36.4 C) (Oral)   Ht 5\' 10"  (1.778 m)   SpO2 97%   BMI 27.99 kg/m    Subjective:    Patient ID: Andrew Cobb, male    DOB: [birth year], 68 y.o.   MRN: [id number]  HPI: Andrew Cobb is a 68 y.o. male  Chief Complaint  Patient presents with  . Hospitalization Follow-up   Pt here today for hospital f/u for CVA. Seeing Cardiology next week, Neurology April 15th. Stable with residual deficits since d/c. Started on aspirin, plavix which he is tolerating well. Lisinopril was stopped for permissive HTN. Has a loop recorder placed, echo TEE and EEG unremarkable per record review. Home BPs stable and WNL. Per Cardiology during admission, pt was recommended to increase lipitor from 40 mg to 80 mg but he has not yet done so.   Having abscesses on both underarms x 1 week. Draining some. Dressing with triple antibiotic and compresses. Hx of MRSA infection. No fevers, chills, sweats, body aches.   Transition of Care Hospital Follow up.   Hospital/Facility: [facility] D/C Physician: Dr. Rebeca Alert D/C Date: 09/20/18  Records Requested: already available in epic Records Received: immediately Records Reviewed: 09/23/2018  Diagnoses on Discharge: CVA  Date of interactive Contact within 48 hours of discharge: 09/22/18 Contact was through: phone  Date of 7 day or 14 day face-to-face visit:    within 7 days  Outpatient Encounter Medications as of 09/23/2018  Medication Sig  . albuterol (PROVENTIL HFA;VENTOLIN HFA) 108 (90 Base) MCG/ACT inhaler Inhale 1 puff into the lungs every 6 (six) hours as needed for wheezing.   [name] Kitchen aspirin 81 MG chewable tablet Chew 1 tablet (81 mg total) by mouth daily.  [name] Kitchen atorvastatin (LIPITOR) 40 MG tablet Take 1 tablet (40 mg total) by mouth daily.  . cetirizine (ZYRTEC) 10 MG tablet Take 10 mg by mouth daily.  . Cholecalciferol (VITAMIN D) 2000 units CAPS Take 2,000 Units by mouth daily.  . clopidogrel (PLAVIX) 75 MG  tablet Take 1 tablet (75 mg total) by mouth daily.  [name] Kitchen glucose blood test strip 1 each by Other route 2 (two) times daily. DX E11.9  . Lancets (ONETOUCH ULTRASOFT) lancets 1 each by Other route 2 (two) times daily. Dx E11.9  . Magnesium 250 MG TABS Take 250 mg by mouth at bedtime.   . metFORMIN (GLUCOPHAGE) 500 MG tablet Take 1 tablet (500 mg total) by mouth 2 (two) times daily with a meal.  . Multiple Vitamins-Minerals (MULTIVITAMIN PO) Take 1 tablet by mouth daily.   [name] Kitchen tobramycin (TOBREX) 0.3 % ophthalmic solution INSTILL 1 DROP INTO RIGHT EYE 4 TIMES DAILY  . valACYclovir (VALTREX) 500 MG tablet Take 1 tablet (500 mg total) by mouth daily.  . clindamycin (CLEOCIN) 300 MG capsule Take 1 capsule (300 mg total) by mouth 2 (two) times daily.  . [DISCONTINUED] Magnesium Gluconate 550 MG TABS Take by mouth.   No facility-administered encounter medications on file as of 09/23/2018.     Diagnostic Tests Reviewed/Disposition:  CVA  Consults: Cardiology, Neurology  Discharge Instructions: reviewed  Disease/illness Education: reviewed  Home Health/Community Services Discussions/Referrals: n/a  Establishment or re-establishment of referral orders for community resources: already set up  Discussion with other health care providers: n/a  Assessment and Support of treatment regimen adherence: excellent  Appointments Coordinated with: Cardiology, Neurology  Education for self-management, independent living, and ADLs: given  Relevant past medical, surgical,  family and social history reviewed and updated as indicated. Interim medical history since our last visit reviewed. Allergies and medications reviewed and updated.  Review of Systems  Per HPI unless specifically indicated above     Objective:    BP (!) 143/87   Pulse (!) 58   Temp 97.6 F (36.4 C) (Oral)   Ht 5\' 10"  (1.778 m)   SpO2 97%   BMI 27.99 kg/m   Wt Readings from Last 3 Encounters:  09/19/18 195 lb 1.7 oz (88.5 kg)   08/19/18 202 lb (91.6 kg)  08/09/18 206 lb 4 oz (93.6 kg)    Physical Exam Vitals signs and nursing note reviewed.  Constitutional:      Appearance: Normal appearance.  HENT:     Head: Atraumatic.  Eyes:     Extraocular Movements: Extraocular movements intact.     Conjunctiva/sclera: Conjunctivae normal.  Neck:     Musculoskeletal: Normal range of motion and neck supple.  Cardiovascular:     Rate and Rhythm: Normal rate and regular rhythm.  Pulmonary:     Effort: Pulmonary effort is normal.     Breath sounds: Normal breath sounds.  Musculoskeletal: Normal range of motion.  Skin:    General: Skin is warm and dry.     Comments: Erythematous, mildly fluctuant, tender boils b/l axillary areas  Neurological:     General: No focal deficit present.     Mental Status: He is oriented to person, place, and time.  Psychiatric:        Mood and Affect: Mood normal.        Thought Content: Thought content normal.        Judgment: Judgment normal.     Results for orders placed or performed in visit on 09/23/18  Aerobic Culture (superficial specimen)  Result Value Ref Range   Specimen Description ABSCESS RIGHT ARM    Special Requests NONE    Gram Stain      FEW WBC PRESENT, PREDOMINANTLY PMN RARE GRAM POSITIVE COCCI Performed at [facility], [address], [place], [place] [address]    Culture FEW METHICILLIN RESISTANT STAPHYLOCOCCUS AUREUS    Report Status 09/25/2018 FINAL    Organism ID, Bacteria METHICILLIN RESISTANT STAPHYLOCOCCUS AUREUS       Susceptibility   Methicillin resistant staphylococcus aureus - MIC*    CIPROFLOXACIN <=0.5 SENSITIVE Sensitive     ERYTHROMYCIN <=0.25 SENSITIVE Sensitive     GENTAMICIN <=0.5 SENSITIVE Sensitive     OXACILLIN RESISTANT Resistant     TETRACYCLINE >=16 RESISTANT Resistant     VANCOMYCIN 1 SENSITIVE Sensitive     TRIMETH/SULFA <=10 SENSITIVE Sensitive     CLINDAMYCIN <=0.25 SENSITIVE Sensitive     RIFAMPIN <=0.5 SENSITIVE  Sensitive     Inducible Clindamycin NEGATIVE Sensitive     * FEW METHICILLIN RESISTANT STAPHYLOCOCCUS AUREUS      Assessment & Plan:   Problem List Items Addressed This Visit      Cardiovascular and Mediastinum   Essential hypertension    BPs WNL on home checks without lisinopril. Will continue hold for now and await Cardiology appt next week. Continue home checks, add back sooner if readings persistently elevated      CVA (cerebral vascular accident) ([place]) - Primary    Without residual deficits. Following up with both Cardiology and Neurology in the next few weeks. Continue aspirin, plavix, lipitor, BP control        Other   Hyperlipidemia    Can  start taking 2 of the 40 mg lipitor tabs, f/u with Cardiology for further instruction and permanent plan       Other Visit Diagnoses    Abscess of right arm       Hx of MRSA, culture taken today await results. Start clindamycin, topical washes, warm compresses, neosporin. Recheck in 1 week if not resolved   Relevant Orders   Aerobic Culture (superficial specimen) (Completed)       Follow up plan: Return for as scheduled.

## 2018-09-23 NOTE — Patient Instructions (Signed)
Increase to two 40 mg lipitor daily and follow up with Cardiology next week

## 2018-09-25 LAB — AEROBIC CULTURE W GRAM STAIN (SUPERFICIAL SPECIMEN)

## 2018-09-27 ENCOUNTER — Other Ambulatory Visit: Payer: Self-pay | Admitting: Family Medicine

## 2018-09-27 MED ORDER — MUPIROCIN 2 % EX OINT: 1.0000 "application " | TOPICAL_OINTMENT | Freq: Two times a day (BID) | CUTANEOUS | 0 refills | Status: DC

## 2018-09-29 ENCOUNTER — Inpatient Hospital Stay: Payer: PPO | Admitting: Family Medicine

## 2018-09-29 ENCOUNTER — Telehealth: Payer: Self-pay | Admitting: Cardiology

## 2018-09-29 NOTE — Telephone Encounter (Signed)
Called pt and requested his serial number for his Medtronic home monitor. Pt provided serial number as [id number]. Attempted to input it into pt carleink profile but it would not allow me b/c it says that pt has been ordered a new monitor as of 09-28-2018. Informed pt that we would call back tomorrow after I am able to discuss this w/ my co worker. Pt verbalized understanding.

## 2018-09-29 NOTE — Telephone Encounter (Signed)
-----   Message from Mechele Dawley, RN sent at 09/29/2018  2:02 PM EDT ----- Regarding: Carelink monitor SN Please call Mr. Ginsberg to obtain his Carelink monitor serial number. There was an issue on the Medtr[employer] patient registration side, so his info had to be removed and re-entered. Once his monitor SN is entered in [place], please request a manual transmission.  Thank you! Raquel Sarna

## 2018-09-30 ENCOUNTER — Telehealth: Payer: Self-pay | Admitting: Family Medicine

## 2018-09-30 ENCOUNTER — Ambulatory Visit: Payer: PPO

## 2018-09-30 NOTE — Telephone Encounter (Signed)
Spoke with pt and he is getting his wife to take him back to the hospital.

## 2018-09-30 NOTE — Assessment & Plan Note (Signed)
Can start taking 2 of the 40 mg lipitor tabs, f/u with Cardiology for further instruction and permanent plan

## 2018-09-30 NOTE — Telephone Encounter (Signed)
My recommendation would be to go back to the ER for immediately evaluation  Copied from [place] [id number]. Topic: Appointment Scheduling - Scheduling Inquiry for Clinic >> Sep 30, 2018  2:03 PM Scherrie Gerlach wrote: Reason for CRM:  wife calling to advise pt's headache has returning. No problem seeing, no imbalance, just a severe headache, migraine. Concerned and would like a call back asap. This is how his stroke started. >> Sep 30, 2018  2:37 PM Don Perking M wrote: Forwarding to provider in office as well as PCP.

## 2018-09-30 NOTE — Telephone Encounter (Signed)
Requested assistance from [employer] representative.

## 2018-09-30 NOTE — Assessment & Plan Note (Signed)
BPs WNL on home checks without lisinopril. Will continue hold for now and await Cardiology appt next week. Continue home checks, add back sooner if readings persistently elevated

## 2018-09-30 NOTE — Telephone Encounter (Signed)
Per MDT rep, will need to await receipt of new monitor. Unable to re-pair old monitor.  Spoke with patient's wife to update her. She agrees to call the DC back when new monitor is received.  Pt's wife also reports that pt has had a severe headache all day, asked if we could prescribe something for him. BP normal. No other symptoms. Resting now. Wife called PCP and neurologist, was told to proceed to ED for evaluation. Wife reports she was told pt couldn't see PCP in office due to COVID-19. Pt has not gone to ED yet. Reiterated ED recommendations as not any other options at this time. Pt's wife verbalizes understanding, denies any additional questions or concerns.

## 2018-09-30 NOTE — Assessment & Plan Note (Signed)
Without residual deficits. Following up with both Cardiology and Neurology in the next few weeks. Continue aspirin, plavix, lipitor, BP control

## 2018-10-01 ENCOUNTER — Encounter (HOSPITAL_COMMUNITY): Payer: Self-pay | Admitting: *Deleted

## 2018-10-01 ENCOUNTER — Other Ambulatory Visit: Payer: Self-pay

## 2018-10-01 ENCOUNTER — Emergency Department (HOSPITAL_COMMUNITY)
Admission: EM | Admit: 2018-10-01 | Discharge: 2018-10-01 | Disposition: A | Discharge status: Home / Self Care | Payer: PPO | Attending: Emergency Medicine | Admitting: Emergency Medicine

## 2018-10-01 ENCOUNTER — Emergency Department (HOSPITAL_COMMUNITY): Payer: PPO

## 2018-10-01 ENCOUNTER — Telehealth: Payer: Self-pay | Admitting: Cardiology

## 2018-10-01 DIAGNOSIS — I10 Essential (primary) hypertension: Secondary | ICD-10-CM | POA: Diagnosis not present

## 2018-10-01 DIAGNOSIS — Z7902 Long term (current) use of antithrombotics/antiplatelets: Secondary | ICD-10-CM | POA: Diagnosis not present

## 2018-10-01 DIAGNOSIS — G44209 Tension-type headache, unspecified, not intractable: Secondary | ICD-10-CM | POA: Diagnosis not present

## 2018-10-01 DIAGNOSIS — I6930 Unspecified sequelae of cerebral infarction: Secondary | ICD-10-CM

## 2018-10-01 DIAGNOSIS — E119 Type 2 diabetes mellitus without complications: Secondary | ICD-10-CM | POA: Diagnosis not present

## 2018-10-01 DIAGNOSIS — J45909 Unspecified asthma, uncomplicated: Secondary | ICD-10-CM | POA: Insufficient documentation

## 2018-10-01 DIAGNOSIS — Z79899 Other long term (current) drug therapy: Secondary | ICD-10-CM | POA: Diagnosis not present

## 2018-10-01 DIAGNOSIS — Z87891 Personal history of nicotine dependence: Secondary | ICD-10-CM | POA: Insufficient documentation

## 2018-10-01 DIAGNOSIS — Z7984 Long term (current) use of oral hypoglycemic drugs: Secondary | ICD-10-CM | POA: Diagnosis not present

## 2018-10-01 DIAGNOSIS — G44229 Chronic tension-type headache, not intractable: Secondary | ICD-10-CM | POA: Insufficient documentation

## 2018-10-01 DIAGNOSIS — E785 Hyperlipidemia, unspecified: Secondary | ICD-10-CM | POA: Diagnosis not present

## 2018-10-01 DIAGNOSIS — Z8673 Personal history of transient ischemic attack (TIA), and cerebral infarction without residual deficits: Secondary | ICD-10-CM | POA: Diagnosis not present

## 2018-10-01 DIAGNOSIS — R51 Headache: Secondary | ICD-10-CM | POA: Diagnosis not present

## 2018-10-01 DIAGNOSIS — I2510 Atherosclerotic heart disease of native coronary artery without angina pectoris: Secondary | ICD-10-CM | POA: Insufficient documentation

## 2018-10-01 DIAGNOSIS — Z8546 Personal history of malignant neoplasm of prostate: Secondary | ICD-10-CM | POA: Insufficient documentation

## 2018-10-01 DIAGNOSIS — Z7982 Long term (current) use of aspirin: Secondary | ICD-10-CM | POA: Diagnosis not present

## 2018-10-01 HISTORY — DX: Cerebral infarction, unspecified: I63.9

## 2018-10-01 LAB — BASIC METABOLIC PANEL
Anion gap: 7 (ref 5–15)
BUN: 19 mg/dL (ref 8–23)
CO2: 27 mmol/L (ref 22–32)
Calcium: 9.2 mg/dL (ref 8.9–10.3)
Chloride: 107 mmol/L (ref 98–111)
Creatinine, Ser: 1.15 mg/dL (ref 0.61–1.24)
GFR calc non Af Amer: >60 mL/min (ref >60)
Glucose, Bld: 124 mg/dL — ABNORMAL HIGH (ref 70–99)
Potassium: 4.6 mmol/L (ref 3.5–5.1)
Sodium: 141 mmol/L (ref 135–145)

## 2018-10-01 LAB — CBC
HCT: 41.2 % (ref 39.0–52.0)
Hemoglobin: 12.6 g/dL — ABNORMAL LOW (ref 13.0–17.0)
MCH: 30.3 pg (ref 26.0–34.0)
MCHC: 30.6 g/dL (ref 30.0–36.0)
MCV: 99 fL (ref 80.0–100.0)
Platelets: 155 10*3/uL (ref 150–400)
RBC: 4.16 MIL/uL — ABNORMAL LOW (ref 4.22–5.81)
RDW: 12.6 % (ref 11.5–15.5)
WBC: 5.1 10*3/uL (ref 4.0–10.5)
nRBC: 0 % (ref 0.0–0.2)

## 2018-10-01 MED ORDER — DIPHENHYDRAMINE HCL 25 MG PO CAPS
25.0000 mg | ORAL_CAPSULE | Freq: Once | ORAL | Status: AC
  Administered 2018-10-01: 25 mg via ORAL
  Filled 2018-10-01: qty 1

## 2018-10-01 MED ORDER — DIPHENHYDRAMINE HCL 25 MG PO CAPS: 50.0000 mg | ORAL_CAPSULE | Freq: Once | ORAL | Status: DC

## 2018-10-01 MED ORDER — PROCHLORPERAZINE MALEATE 5 MG PO TABS
10.0000 mg | ORAL_TABLET | Freq: Once | ORAL | Status: AC
  Administered 2018-10-01: 10 mg via ORAL
  Filled 2018-10-01: qty 2

## 2018-10-01 MED ORDER — VALPROATE SODIUM 500 MG/5ML IV SOLN
500.0000 mg | Freq: Once | INTRAVENOUS | Status: DC
  Filled 2018-10-01: qty 5

## 2018-10-01 MED ORDER — SUMATRIPTAN SUCCINATE 100 MG PO TABS: 100.0000 mg | ORAL_TABLET | Freq: Every day | ORAL | 0 refills | Status: DC | PRN
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
## 2018-10-01 NOTE — ED Provider Notes (Signed)
[name] EMERGENCY DEPARTMENT Provider Note   CSN: [id number] Arrival date & time: 10/01/18  1031    History   Chief Complaint Chief Complaint  Patient presents with  . Headache    HPI Andrew Cobb is a 68 y.o. male w/ PMH of CAD s/p PCI, HLD, HTN, DM and recent CVA (3/14) presenting with persistent headache. He states since his last admission for CVA he has been having persistent migraines intermittently squeezing pain mostly on left side without radiation. He states his headache is exacerbated by loud noises and mildly alleviated by Alleve and Tylenol but the pain has been gradually worsening since his discharge. He mentions that he was trying to go see his PCP and neurologist office for evaluation but was told that they will not see him due to the COVID-19 pandemic and was told to go to ED for evaluation. He denies any further syncopal episodes, blurry vision, numbness, tingling or weakness. Denies any chest pain, palpitations, dyspnea, fevers, chills.    Past Medical History:  Diagnosis Date  . Arthritis   . Asthma   . Cancer [facility])    prostate  . Chronic venous insufficiency    varicose vein lower extremity with inflammation  . Coronary artery disease 1996   two stents placed   . Diabetes mellitus without complication ([place])    type 2 on metformin  . GERD (gastroesophageal reflux disease)    no issues since gastric bypass surgery as stated per pt  . Hyperlipidemia   . Hypogonadism in male   . MRSA (methicillin resistant Staphylococcus aureus) infection    07/30/2008 thru 08/07/2008  . Sleep apnea    on BIPAP  . Stented coronary artery   . Stroke ([place])   . Thrombocythemia [facility])     Patient Active Problem List   Diagnosis Date Noted  . CVA (cerebral vascular accident) ([place]) 09/18/2018  . Coronary artery disease involving native coronary artery of native heart without angina pectoris   . H/O right coronary artery stent placement   . HTN  (hypertension), benign   . Allergic rhinitis 08/19/2018  . Cervical radiculopathy 08/17/2017  . Advanced care planning/counseling discussion 07/21/2017  . Skin lesion of right arm 12/16/2016  . Prostate cancer ([place]) 12/08/2016  . H/O bariatric surgery 07/17/2016  . Essential hypertension 04/24/2015  . Type 2 diabetes mellitus without complication, without long-term current use of insulin ([place]) 04/24/2015  . Chronic sinusitis 08/13/2009  . Hyperlipidemia 04/02/2009  . Coronary atherosclerosis 04/02/2009  . 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 OSA (obstructive sleep apnea) 04/02/2009  . Arthritis of shoulder region, degenerative 04/02/2009    Past Surgical History:  Procedure Laterality Date  . ANGIOPLASTY    . ANGIOPLASTY     with stent 04/07/1995  . ANTERIOR CERVICAL DECOMPRESSION/DISCECTOMY FUSION 4 LEVELS N/A 08/17/2017   Procedure: Anterior discectomy with fusion and plate fixation Cervical Three-Four, Four-Five, Five-Six, and Six-Seven Fusion;  Surgeon: Ditty, Kevan Ny, MD;  Location: [facility];  Service: Neurosurgery;  Laterality: N/A;  Anterior discectomy with fusion and plate fixation Cervical Three-Four, Four-Five, Five-Six, and Six-Seven Fusion   . APPENDECTOMY     1966  . BUBBLE STUDY  09/20/2018   Procedure: BUBBLE STUDY;  Surgeon: Josue Hector, MD;  Location: [facility];  Service: Cardiovascular;;  . [place]  . CARDIOVASCULAR STRESS TEST     07/31/2011  . CARPAL TUNNEL RELEASE Left   . CHOLECYSTECTOMY     2006  .  colonscopy      08/25/2012  . EYE SURGERY Bilateral    cataract  . FUNCTIONAL ENDOSCOPIC SINUS SURGERY     11/10/2013  . GASTRIC BYPASS     10/05/2012  . HERNIA REPAIR     left inguinal 1981  . INCISION AND DRAINAGE ABSCESS Right 02/26/2017   Procedure: INCISION AND DRAINAGE ABSCESS;  Surgeon: Nickie Retort, MD;  Location: [facility] ORS;  Service: Urology;  Laterality: Right;  . JOINT REPLACEMENT     bilateral   . left ankle surgery      05/03/2003   . left carpel tunnel      09/18/1993  . left knee meniscal tear      01/25/2010  . left knee meniscal tear repair      05/04/1996  . left rotator cuff repair      05/03/2003   . LOOP RECORDER INSERTION N/A 09/20/2018   Procedure: LOOP RECORDER INSERTION;  Surgeon: Evans Lance, MD;  Location: [facility] CV LAB;  Service: Cardiovascular;  Laterality: N/A;  . REPLACEMENT TOTAL KNEE BILATERAL  07/13/2015  . right ankle surgery      fracture has 2 screws 07/07/1997  . right carpel tunnel      05/16/1992  . right shoulder replacement      01/27/2006  . SCROTAL EXPLORATION Right 02/26/2017   Procedure: SCROTUM EXPLORATION;  Surgeon: Nickie Retort, MD;  Location: [facility];  Service: Urology;  Laterality: Right;  . TEE WITHOUT CARDIOVERSION N/A 09/20/2018   Procedure: TRANSESOPHAGEAL ECHOCARDIOGRAM (TEE);  Surgeon: Josue Hector, MD;  Location: [facility] ENDOSCOPY;  Service: Cardiovascular;  Laterality: N/A;  . TOTAL KNEE ARTHROPLASTY Left 07/13/2015   Procedure: LEFT TOTAL KNEE ARTHROPLASTY;  Surgeon: Gaynelle Arabian, MD;  Location: [facility];  Service: Orthopedics;  Laterality: Left;     Home Medications    Prior to Admission medications   Medication Sig Start Date End Date Taking? Authorizing Provider  albuterol (PROVENTIL HFA;VENTOLIN HFA) 108 (90 Base) MCG/ACT inhaler Inhale 1 puff into the lungs every 6 (six) hours as needed for wheezing.     [provider]  aspirin 81 MG chewable tablet Chew 1 tablet (81 mg total) by mouth daily. 09/21/18   Masoudi, Dorthula Rue, MD  atorvastatin (LIPITOR) 40 MG tablet Take 1 tablet (40 mg total) by mouth daily. 09/06/18   Guadalupe Maple, MD  cetirizine (ZYRTEC) 10 MG tablet Take 10 mg by mouth daily.    [provider]  Cholecalciferol (VITAMIN D) 2000 units CAPS Take 2,000 Units by mouth daily.    [provider]  clindamycin (CLEOCIN) 300 MG capsule Take 1 capsule (300 mg total) by mouth 2  (two) times daily. 09/23/18   Volney American, PA-C  clopidogrel (PLAVIX) 75 MG tablet Take 1 tablet (75 mg total) by mouth daily. 09/21/18   Masoudi, Elhamalsadat, MD  glucose blood test strip 1 each by Other route 2 (two) times daily. DX E11.9 08/12/17   Guadalupe Maple, MD  Lancets [facility] ULTRASOFT) lancets 1 each by Other route 2 (two) times daily. Dx E11.9 04/24/15   Arlis Porta., MD  Magnesium 250 MG TABS Take 250 mg by mouth at bedtime.     [provider]  metFORMIN (GLUCOPHAGE) 500 MG tablet Take 1 tablet (500 mg total) by mouth 2 (two) times daily with a meal. 09/06/18   Crissman, Jeannette How, MD  Multiple Vitamins-Minerals (MULTIVITAMIN PO) Take 1 tablet by mouth daily.  [provider]  mupirocin ointment (BACTROBAN) 2 % Place 1 application into the nose 2 (two) times daily. 09/27/18   Volney American, PA-C  SUMAtriptan (IMITREX) 100 MG tablet Take 1 tablet (100 mg total) by mouth daily as needed for migraine. May repeat in 2 hours if headache persists or recurs. 10/01/18   Mosetta Anis, MD  tobramycin (TOBREX) 0.3 % ophthalmic solution INSTILL 1 DROP INTO RIGHT EYE 4 TIMES DAILY 09/16/18   [provider]  valACYclovir (VALTREX) 500 MG tablet Take 1 tablet (500 mg total) by mouth daily. 09/06/18   Guadalupe Maple, MD   Family History Family History  Problem Relation Age of Onset  . Cancer Mother        pancreatic  . Diabetes Mother   . Stroke Mother   . Heart disease Mother   . Hyperlipidemia Mother   . Hypertension Mother   . Heart attack Mother   . Heart disease Father   . Stroke Father   . Diabetes Father   . Hypertension Father   . Heart attack Father   . Hyperlipidemia Father   . Pancreatic cancer Father   . Cancer Sister   . Cancer Brother        lung  . Cancer Brother   . Kidney cancer Neg Hx   . Bladder Cancer Neg Hx   . Prostate cancer Neg Hx     Social History Social History   Tobacco Use  . Smoking status:  Former Smoker    Packs/day: 1.00    Years: 10.00    Pack years: 10.00    Types: Cigarettes    Last attempt to quit: 07/07/1984    Years since quitting: 34.2  . Smokeless tobacco: Never Used  . Tobacco comment: quit 1986  Substance Use Topics  . Alcohol use: No    Alcohol/week: 0.0 standard drinks  . Drug use: No   Allergies   Succinylsulphathiazole; Sulfamethoxazole-trimethoprim; and Tetracyclines & related  Review of Systems Review of Systems  Constitutional: Negative for chills, fatigue and fever.  Respiratory: Negative for shortness of breath and wheezing.   Cardiovascular: Negative for chest pain and palpitations.  Gastrointestinal: Negative for constipation, diarrhea, nausea and vomiting.  Genitourinary: Negative for dysuria, frequency and urgency.  Neurological: Positive for headaches. Negative for dizziness, weakness, light-headedness and numbness.  All other systems reviewed and are negative.   Physical Exam Updated Vital Signs BP (!) 157/97 (BP Location: Right Arm)   Pulse 60   Temp 97.8 F (36.6 C) (Oral)   Resp 18   Ht 5\' 10"  (1.778 m)   Wt 88.5 kg   SpO2 100%   BMI 27.99 kg/m   Physical Exam Constitutional:      General: He is not in acute distress.    Appearance: He is normal weight.  HENT:     Head: Normocephalic and atraumatic.  Eyes:     Extraocular Movements: Extraocular movements intact.     Pupils: Pupils are equal, round, and reactive to light.  Neck:     Musculoskeletal: Normal range of motion and neck supple. No neck rigidity.  Cardiovascular:     Rate and Rhythm: Normal rate and regular rhythm.     Heart sounds: Normal heart sounds. No murmur.  Pulmonary:     Effort: Pulmonary effort is normal.     Breath sounds: Normal breath sounds. No wheezing or rales.  Abdominal:     General: Bowel sounds are normal. There is  no distension.     Palpations: Abdomen is soft.     Tenderness: There is no abdominal tenderness.  Musculoskeletal: Normal  range of motion.        General: No swelling.  Skin:    General: Skin is warm and dry.  Neurological:     Comments: Neurologic exam: Mental status: A&Ox3 Cranial Nerves: II: PERRL III, IV, VI: Extra-occular motions intact bilaterally V, VII: Face symmetric, sensation intact in all 3 divisions  VIII: hearing normal to rubbing fingers bilaterally  IX, X: palate rises symmetrically XI: Head turn and shoulder shrug normal bilaterally  XII: tongue midline  Motor: Strength 5/5 on all upper and lower extremities, bulk muscle and tone are normal Sensory: Light touch intact and symmetric bilaterally  Coordination: There is no dysmetria on finger-to-nose. Rapid alternating movement test normal. Psychiatric: Normal mood and affect    ED Treatments / Results  Labs (all labs ordered are listed, but only abnormal results are displayed) Labs Reviewed  CBC - Abnormal; Notable for the following components:      Result Value   RBC 4.16 (*)    Hemoglobin 12.6 (*)    All other components within normal limits  BASIC METABOLIC PANEL - Abnormal; Notable for the following components:   Glucose, Bld 124 (*)    All other components within normal limits    EKG None  Radiology Ct Head Wo Contrast  Result Date: 10/01/2018 CLINICAL DATA:  Headache EXAM: CT HEAD WITHOUT CONTRAST TECHNIQUE: Contiguous axial images were obtained from the base of the skull through the vertex without intravenous contrast. COMPARISON:  Head CT September 18, 2018; brain MRI September 18, 2018 FINDINGS: Brain: There is mild to moderate diffuse atrophy. There is evidence of a recent infarct in the left parietooccipital junction. There is acute hemorrhage within this infarct. Elsewhere, there is extensive periventricular small vessel disease throughout the centra semiovale bilaterally. A recent infarct in the left frontal lobe appears slightly less  prominent than on recent CT and MR. No new infarct is evident compared to recent MR examination. Vascular: There is no appreciable hyperdense vessel. There is calcification in each carotid siphon region, more notable on the right than on the left. Skull: The bony calvarium appears intact. Sinuses/Orbits: There is mucosal thickening in several ethmoid air cells. Other visualized paranasal sinuses are clear. Orbits appear symmetric bilaterally. Other: Mastoid air cells are clear. IMPRESSION: 1. Focal hemorrhage in a recent left parieto-occipital infarct. No other focal hemorrhage evident. 2. Atrophy with extensive periventricular small vessel disease throughout the centra semiovale bilaterally. Fairly small recent mid left frontal infarct, less apparent than on recent studies. 3.  Foci of arterial vascular calcification noted. 4.  Mucosal thickening in several ethmoid air cells. Critical Value/emergent results were called by telephone at the time of interpretation on 10/01/2018 at 12:19 pm to [facility], [place], who verbally acknowledged these results. Electronically Signed   By: Lowella Grip III M.D.   On: 10/01/2018 12:20    Procedures Procedures (including critical care time)  Medications Ordered in ED Medications  valproate (DEPACON) injection 500 mg (500 mg Intravenous Not Given 10/01/18 1308)  prochlorperazine (COMPAZINE) tablet 10 mg (10 mg Oral Given 10/01/18 1106)  diphenhydrAMINE (BENADRYL) capsule 25 mg (25 mg Oral Given 10/01/18 1106)    Initial Impression / Assessment and Plan / ED Course  I have reviewed the triage vital signs and the nursing notes.  Pertinent labs & imaging results that were available during my care of  the patient were reviewed by me and considered in my medical decision making (see chart for details).  Mr.Dino is a 68 y.o. male w/ PMH of CAD s/p PCI, HLD, HTN, DM and recent CVA (3/14) presenting with persistent headache. Description of his headache makes migraine  the most likely diagnosis. His recent admission for CVA is concerning as his symptoms appeared to have started after discharge. Found to have Concerning for bleed as he was recently started on DAPT. Will get head CT to r/o bleed or new stroke.  Head CT shows focal hemorrhage in left parieto-occipital infarct. Will consult neurology for recommendations.  Neuro states evidence of focal hemorrhage was apparent in prior CT/MRI and this is a chronic issue. Unlikely to explain presenting symptoms but will need f/u as outpatient for repeat head CT. Recommend stopping aspirin and clopidogrel.  Re-evaluated at bedside and states pain now down to 1-2/10. Significantly improved from presenting severity of 7/10. Will discharge home with migraine meds and recommendation to f/u with neuro as outpatient.  Final Clinical Impressions(s) / ED Diagnoses   Final diagnoses:  Chronic tension-type headache, not intractable     ED Discharge Orders         Ordered    SUMAtriptan (IMITREX) 100 MG tablet  Daily PRN     10/01/18 1315           Mosetta Anis, MD 10/01/18 [place], [place], DO 10/01/18 1725
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
## 2018-10-01 NOTE — Telephone Encounter (Signed)
Patient wife called and stated that pt received his new home monitor. instructed her how to send a manual transmission w/ the monitor. Transmission received.
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
## 2018-10-01 NOTE — Discharge Instructions (Addendum)
Dear Andrew Cobb  You came to [place] with headache. We have determined this was caused by hemorrhage of your old stroke. Here are our recommendations for you at discharge:  Please stop taking both the aspirin and clopidogrel. Please start taking Tylenol and Benadryl as needed for your headache Take sumatriptan 100mg  daily as needed for severe pain.  Please follow up with your neurologist.  Thank you for choosing [facility]

## 2018-10-04 ENCOUNTER — Other Ambulatory Visit: Payer: Self-pay | Admitting: *Deleted

## 2018-10-04 NOTE — Patient Outreach (Signed)
[facility] outreach for high risk HTA patient. [name] reported to the ED on 3/27 with intractable HA. He was concerned that this could be one of the sxs for the COVID19 virus. He also had a recent CVA on 09/18/18. He has been unable to be seen by neurology because of office restrictions and scheduling difficulties.  No one answered the phone this am but I was able to leave a message and requested a return call.  Eulah Pont. Myrtie Neither, MSN, [facility] Gerontological Nurse Practitioner [facility] Care Management [phone]

## 2018-10-05 ENCOUNTER — Other Ambulatory Visit: Payer: Self-pay | Admitting: *Deleted

## 2018-10-05 ENCOUNTER — Other Ambulatory Visit: Payer: Self-pay

## 2018-10-05 NOTE — Patient Outreach (Signed)
Telephone outreach, unable to contact pt today. I did leave a message and requested a return call.  Andrew Cobb. Myrtie Neither, MSN, [facility] Gerontological Nurse Practitioner [facility] Care Management [phone]

## 2018-10-06 ENCOUNTER — Ambulatory Visit: Payer: Self-pay | Admitting: *Deleted

## 2018-10-12 ENCOUNTER — Telehealth: Payer: Self-pay | Admitting: Adult Health

## 2018-10-12 NOTE — Telephone Encounter (Signed)
I called pts wife to explain [name] NP recommend telephone visit being that he is new to our office.The wife stated the computer is not set up for a video visit. I stated due to COVID 19 only doing tele or video visit. Consent was given for tele and file insurance. THe wife verbalized understanding and recommend we call pts cell phone. I stated to wife they have to be at home for he visit to protect privacy for MD and pt. THe wife verbalized understanding.

## 2018-10-12 NOTE — Telephone Encounter (Signed)
Pts wife requesting a call from RN or NP to discuss the pt coming in face to face, stating they don't have a phone/computer that allows them to do a video visit. Only able to do a telephone visit or coming in to the office. Stating he needs something done- pt running out of medication and has had to go back to the ER due to increasing h/a. Please advise

## 2018-10-12 NOTE — Telephone Encounter (Signed)
Due to his complaints, can you set up a telephone visit tomorrow or Thursday to review the symptoms and start him on something for headache management as he should not be on Imitrex due to recent stroke.  I would not recommend he comes into the office at this time due to COVID-19 safety concerns.

## 2018-10-13 ENCOUNTER — Encounter: Payer: Self-pay | Admitting: Adult Health

## 2018-10-13 ENCOUNTER — Ambulatory Visit (INDEPENDENT_AMBULATORY_CARE_PROVIDER_SITE_OTHER): Payer: PPO | Admitting: Adult Health

## 2018-10-13 ENCOUNTER — Other Ambulatory Visit: Payer: Self-pay

## 2018-10-13 DIAGNOSIS — R519 Headache, unspecified: Secondary | ICD-10-CM

## 2018-10-13 DIAGNOSIS — G4733 Obstructive sleep apnea (adult) (pediatric): Secondary | ICD-10-CM

## 2018-10-13 DIAGNOSIS — H029 Unspecified disorder of eyelid: Secondary | ICD-10-CM | POA: Diagnosis not present

## 2018-10-13 DIAGNOSIS — R51 Headache: Secondary | ICD-10-CM | POA: Diagnosis not present

## 2018-10-13 DIAGNOSIS — H11002 Unspecified pterygium of left eye: Secondary | ICD-10-CM | POA: Diagnosis not present

## 2018-10-13 DIAGNOSIS — E119 Type 2 diabetes mellitus without complications: Secondary | ICD-10-CM

## 2018-10-13 DIAGNOSIS — H353132 Nonexudative age-related macular degeneration, bilateral, intermediate dry stage: Secondary | ICD-10-CM | POA: Diagnosis not present

## 2018-10-13 DIAGNOSIS — H35721 Serous detachment of retinal pigment epithelium, right eye: Secondary | ICD-10-CM | POA: Diagnosis not present

## 2018-10-13 DIAGNOSIS — I10 Essential (primary) hypertension: Secondary | ICD-10-CM | POA: Diagnosis not present

## 2018-10-13 DIAGNOSIS — E782 Mixed hyperlipidemia: Secondary | ICD-10-CM

## 2018-10-13 DIAGNOSIS — H11001 Unspecified pterygium of right eye: Secondary | ICD-10-CM | POA: Diagnosis not present

## 2018-10-13 DIAGNOSIS — H353211 Exudative age-related macular degeneration, right eye, with active choroidal neovascularization: Secondary | ICD-10-CM | POA: Diagnosis not present

## 2018-10-13 DIAGNOSIS — I639 Cerebral infarction, unspecified: Secondary | ICD-10-CM | POA: Diagnosis not present

## 2018-10-13 DIAGNOSIS — H35371 Puckering of macula, right eye: Secondary | ICD-10-CM | POA: Diagnosis not present

## 2018-10-13 MED ORDER — AMITRIPTYLINE HCL 25 MG PO TABS: 25.0000 mg | ORAL_TABLET | Freq: Every day | ORAL | 3 refills | Status: DC
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
## 2018-10-15 ENCOUNTER — Telehealth: Payer: Self-pay | Admitting: Nurse Practitioner

## 2018-10-15 NOTE — Telephone Encounter (Signed)
  Patient needs assistance in sending a photo of his wound to the office per previous conversation

## 2018-10-15 NOTE — Telephone Encounter (Signed)
Attempted to help pt send my chart message w/ picture of device site. Pt believes that he sent the image.

## 2018-10-15 NOTE — Telephone Encounter (Signed)
10/15/2018 spoke with Mr Kliebert- he will have his wife Chief of Staff- sent text to his cellphone,, will call back at lunch to assist with sending pic njm

## 2018-10-17 NOTE — Progress Notes (Signed)
I agree with the above plan 

## 2018-10-18 ENCOUNTER — Encounter: Payer: PPO | Admitting: *Deleted

## 2018-10-18 ENCOUNTER — Telehealth (INDEPENDENT_AMBULATORY_CARE_PROVIDER_SITE_OTHER): Payer: PPO | Admitting: *Deleted

## 2018-10-18 ENCOUNTER — Encounter: Payer: Self-pay | Admitting: Family Medicine

## 2018-10-18 ENCOUNTER — Telehealth: Payer: Self-pay | Admitting: Family Medicine

## 2018-10-18 ENCOUNTER — Ambulatory Visit (INDEPENDENT_AMBULATORY_CARE_PROVIDER_SITE_OTHER): Payer: PPO | Admitting: Family Medicine

## 2018-10-18 DIAGNOSIS — E119 Type 2 diabetes mellitus without complications: Secondary | ICD-10-CM

## 2018-10-18 DIAGNOSIS — E782 Mixed hyperlipidemia: Secondary | ICD-10-CM | POA: Diagnosis not present

## 2018-10-18 DIAGNOSIS — I10 Essential (primary) hypertension: Secondary | ICD-10-CM

## 2018-10-18 DIAGNOSIS — I6310 Cerebral infarction due to embolism of unspecified precerebral artery: Secondary | ICD-10-CM | POA: Diagnosis not present

## 2018-10-18 DIAGNOSIS — E1169 Type 2 diabetes mellitus with other specified complication: Secondary | ICD-10-CM

## 2018-10-18 MED ORDER — VALACYCLOVIR HCL 500 MG PO TABS: 500.0000 mg | ORAL_TABLET | Freq: Every day | ORAL | 2 refills | Status: DC

## 2018-10-18 MED ORDER — METFORMIN HCL 500 MG PO TABS: 500.0000 mg | ORAL_TABLET | Freq: Two times a day (BID) | ORAL | 2 refills | Status: DC

## 2018-10-18 MED ORDER — ATORVASTATIN CALCIUM 40 MG PO TABS: 40.0000 mg | ORAL_TABLET | Freq: Every day | ORAL | 2 refills | Status: DC

## 2018-10-18 MED ORDER — LISINOPRIL 20 MG PO TABS: 20.0000 mg | ORAL_TABLET | Freq: Every day | ORAL | 3 refills | Status: DC

## 2018-10-18 NOTE — Telephone Encounter (Signed)
Spoke with patient. Transitioned to televisit, wound photo received. See encounter note.

## 2018-10-18 NOTE — Addendum Note (Signed)
Addended by: Golden Pop A on: 10/18/2018 10:24 AM   Modules accepted: Orders

## 2018-10-18 NOTE — Telephone Encounter (Signed)
Copied from [place] [phone]. Topic: Quick Communication - Rx Refill/Question >> Oct 18, 2018  1:10 PM Sheppard Coil, [employer] wrote: Medication:   Pt called and left message on [place].  States that he inadvertently told Dr. Jeananne Rama to send his prescriptions in to the local pharmacy.  Pt needs all of these resent to [facility]) - [place], [place] [phone] (Phone) [phone] (Fax)

## 2018-10-18 NOTE — Assessment & Plan Note (Addendum)
On review from neurology recommended blood pressure be controlled at less than 130/90.  Because of that will increase lisinopril from 10 mg a day to 20 mg a day. Patient will monitor his blood pressure for control.
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
## 2018-10-18 NOTE — Assessment & Plan Note (Signed)
The current medical regimen is effective;  continue present plan and medications.  

## 2018-10-18 NOTE — Progress Notes (Signed)
Patient verbally consented to ILR wound check via virtual visit due to Covid-19.  ILR wound check via virtual visit, patient sent in photo of incision. Steri-strips removed by patient prior to appointment. Wound without redness or edema. Incision edges approximated, wound well healed. Manual ILR transmission reviewed. Battery status: good. No symptom, tachy, pause, or brady episodes. 6 "AF" episodes--ECGs appear SR w/ectopy, undersensing, and oversensing due to artifact. Monthly summary reports and ROV with GT PRN.  Pt gave verbal permission to include the following photo:
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
## 2018-10-19 ENCOUNTER — Other Ambulatory Visit: Payer: Self-pay

## 2018-10-19 LAB — CUP PACEART REMOTE DEVICE CHECK
Date Time Interrogation Session: 20200413214719
Implantable Pulse Generator Implant Date: 20200316

## 2018-10-20 ENCOUNTER — Inpatient Hospital Stay: Payer: PPO | Admitting: Adult Health

## 2018-10-21 ENCOUNTER — Ambulatory Visit: Payer: PPO

## 2018-10-21 ENCOUNTER — Telehealth: Payer: Self-pay

## 2018-10-21 NOTE — Telephone Encounter (Signed)
Waiting for paperwork to be signed by Dr.Crissman, they patient will be notified to pick it up.  Copied from [place] [phone]. Topic: General - Other >> Oct 19, 2018  8:42 AM Celene Kras A wrote: Reason for CRM: Pts wife called stating they had dropped off the paperwork needed to help pt keep his license due to his stroke. Pts wife asks to be called when paperwork is filled out so she can pick it up. Please advise. >> Oct 19, 2018  3:46 PM Stark Klein wrote: Has this form been completed?  >> Oct 20, 2018  1:35 PM Amada Kingfisher, CMA wrote: [place] and I have looked for form. Unable to find. We have not seen.  >> Oct 21, 2018  8:45 AM Sandria Manly, CMA wrote: Laural Roes and Santiago Glad,  Have you guys seen this form? >> Oct 21, 2018  8:53 AM Shaune Pollack wrote: Shari Heritage is in Dr Rance Muir review folder to be completed.  Just FYI >> Oct 21, 2018  9:21 AM Shaune Pollack wrote: Spoke with patients wife. She is aware the paperwork is here for Dr Jeananne Rama to complete. She is hoping he can have this completed by first of next week as patient has to have a portion of the paperwork completed by eye doc also.    Just FYI  Thanks

## 2018-10-25 ENCOUNTER — Telehealth: Payer: Self-pay

## 2018-10-25 NOTE — Telephone Encounter (Signed)
I requested that the pt send a manual transmission with his monitor. It transmitted successfully. I told him the nurse will call him if she see anything but will not call if everything looks normal

## 2018-10-26 NOTE — Telephone Encounter (Signed)
Full report received.  5 AF episodes reviewed. Very difficult baseline. No clear AF seen - will continue to monitor.

## 2018-10-29 ENCOUNTER — Telehealth: Payer: Self-pay | Admitting: Family Medicine

## 2018-10-29 NOTE — Telephone Encounter (Signed)
Copied from [place] [phone]. Topic: Quick Communication - Rx Refill/Question >> Oct 29, 2018 11:44 AM Leward Quan A wrote: Medication: amitriptyline (ELAVIL) 25 MG tablet   Has the patient contacted their pharmacy? Yes.   (Agent: If no, request that the patient contact the pharmacy for the refill.) (Agent: If yes, when and what did the pharmacy advise?)  Preferred Pharmacy (with phone number or street name): [facility]) - [place], [place] [phone] (Phone) [phone] (Fax)    Agent: Please be advised that RX refills may take up to 3 business days. We ask that you follow-up with your pharmacy.

## 2018-10-31 MED ORDER — AMITRIPTYLINE HCL 25 MG PO TABS: 25.0000 mg | ORAL_TABLET | Freq: Every day | ORAL | 3 refills | Status: DC

## 2018-11-01 DIAGNOSIS — I69898 Other sequelae of other cerebrovascular disease: Secondary | ICD-10-CM | POA: Diagnosis not present

## 2018-11-03 ENCOUNTER — Other Ambulatory Visit: Payer: Self-pay

## 2018-11-03 ENCOUNTER — Ambulatory Visit (INDEPENDENT_AMBULATORY_CARE_PROVIDER_SITE_OTHER): Payer: PPO | Admitting: *Deleted

## 2018-11-03 DIAGNOSIS — I6310 Cerebral infarction due to embolism of unspecified precerebral artery: Secondary | ICD-10-CM | POA: Diagnosis not present

## 2018-11-04 LAB — CUP PACEART REMOTE DEVICE CHECK
Date Time Interrogation Session: 20200429194100
Implantable Pulse Generator Implant Date: 20200316

## 2018-11-12 NOTE — Progress Notes (Signed)
Carelink Summary Report / Loop Recorder 

## 2018-12-06 ENCOUNTER — Ambulatory Visit (INDEPENDENT_AMBULATORY_CARE_PROVIDER_SITE_OTHER): Payer: PPO | Admitting: *Deleted

## 2018-12-06 DIAGNOSIS — I6310 Cerebral infarction due to embolism of unspecified precerebral artery: Secondary | ICD-10-CM | POA: Diagnosis not present

## 2018-12-08 ENCOUNTER — Telehealth: Payer: Self-pay | Admitting: Family Medicine

## 2018-12-08 LAB — CUP PACEART REMOTE DEVICE CHECK
Date Time Interrogation Session: 20200601201015
Implantable Pulse Generator Implant Date: 20200316

## 2018-12-08 MED ORDER — BENAZEPRIL HCL 40 MG PO TABS: 40.0000 mg | ORAL_TABLET | Freq: Every day | ORAL | 3 refills | Status: DC

## 2018-12-08 NOTE — Telephone Encounter (Signed)
Copied from [place] [id number]. Topic: General - Other >> Dec 07, 2018 12:00 PM Rainey Pines A wrote: Patients wife [name] called and stated that patient would like a callback from nurse in regards to taking the benazepril (LOTENSIN) 40 MG tablet for blood pressure.  Patients blood pressure has been 155-165/75 lately. BP reading today was 155/73.

## 2018-12-14 NOTE — Progress Notes (Signed)
Carelink Summary Report / Loop Recorder 

## 2018-12-21 ENCOUNTER — Telehealth: Payer: Self-pay | Admitting: Family Medicine

## 2018-12-21 NOTE — Chronic Care Management (AMB) (Signed)
Chronic Care Management   Note  12/21/2018 Name: Andrew Cobb MRN: [id number] DOB: July 15, 1950  Andrew Cobb is a 68 y.o. year old male who is a primary care patient of Crissman, Jeannette How, MD. I reached out to [name] by phone today in response to a referral sent by Andrew Cobb's health plan.    Andrew Cobb was given information about Chronic Care Management services today including:  1. CCM service includes personalized support from designated clinical staff supervised by his physician, including individualized plan of care and coordination with other care providers 2. 24/7 contact phone numbers for assistance for urgent and routine care needs. 3. Service will only be billed when office clinical staff spend 20 minutes or more in a month to coordinate care. 4. Only one practitioner may furnish and bill the service in a calendar month. 5. The patient may stop CCM services at any time (effective at the end of the month) by phone call to the office staff. 6. The patient will be responsible for cost sharing (co-pay) of up to 20% of the service fee (after annual deductible is met).  Patient agreed to services and verbal consent obtained.   Follow up plan: Telephone appointment with CCM team member scheduled for: 01/10/2019  [place]  ??[email or web address]   ??[phone]

## 2018-12-24 ENCOUNTER — Telehealth: Payer: Self-pay | Admitting: Cardiology

## 2018-12-24 NOTE — Telephone Encounter (Signed)
Manual transmission received. 3 new "AF" episodes noted, longest 42min. Challenging to interpret ECGs due to artifact and variable baseline. Routed to Dr. Lovena Le for review.

## 2018-12-24 NOTE — Telephone Encounter (Signed)
Spoke w/ pt and requested that he send a manual transmission w/ his home monitor. Transmission received.

## 2018-12-28 ENCOUNTER — Telehealth: Payer: Self-pay | Admitting: Family Medicine

## 2018-12-28 DIAGNOSIS — E119 Type 2 diabetes mellitus without complications: Secondary | ICD-10-CM

## 2018-12-28 NOTE — Telephone Encounter (Signed)
Medication Refill - Medication: glucose blood test strip    Has the patient contacted their pharmacy? Yes.    (Agent: If yes, when and what did the pharmacy advise?) Pt's wife called and stated that the pharmacy told her that the prescription must be renewed before they could fill.   Preferred Pharmacy (with phone number or street name):  [facility]) - [place], [place] [phone] (Phone) [phone] (Fax)     Agent: Please be advised that RX refills may take up to 3 business days. We ask that you follow-up with your pharmacy.

## 2018-12-30 MED ORDER — GLUCOSE BLOOD VI STRP: 1.0000 | ORAL_STRIP | Freq: Two times a day (BID) | 12 refills | Status: DC

## 2018-12-31 ENCOUNTER — Emergency Department: Payer: PPO

## 2018-12-31 ENCOUNTER — Encounter: Payer: Self-pay | Admitting: Emergency Medicine

## 2018-12-31 ENCOUNTER — Emergency Department
Admission: EM | Admit: 2018-12-31 | Discharge: 2018-12-31 | Disposition: A | Discharge status: Home / Self Care | Payer: PPO | Attending: Emergency Medicine | Admitting: Emergency Medicine

## 2018-12-31 ENCOUNTER — Other Ambulatory Visit: Payer: Self-pay

## 2018-12-31 DIAGNOSIS — M545 Low back pain: Secondary | ICD-10-CM | POA: Diagnosis not present

## 2018-12-31 DIAGNOSIS — Y999 Unspecified external cause status: Secondary | ICD-10-CM | POA: Insufficient documentation

## 2018-12-31 DIAGNOSIS — Z79899 Other long term (current) drug therapy: Secondary | ICD-10-CM | POA: Diagnosis not present

## 2018-12-31 DIAGNOSIS — J45909 Unspecified asthma, uncomplicated: Secondary | ICD-10-CM | POA: Diagnosis not present

## 2018-12-31 DIAGNOSIS — Z7984 Long term (current) use of oral hypoglycemic drugs: Secondary | ICD-10-CM | POA: Insufficient documentation

## 2018-12-31 DIAGNOSIS — M25511 Pain in right shoulder: Secondary | ICD-10-CM | POA: Insufficient documentation

## 2018-12-31 DIAGNOSIS — S3992XA Unspecified injury of lower back, initial encounter: Secondary | ICD-10-CM | POA: Diagnosis not present

## 2018-12-31 DIAGNOSIS — Z87891 Personal history of nicotine dependence: Secondary | ICD-10-CM | POA: Diagnosis not present

## 2018-12-31 DIAGNOSIS — I10 Essential (primary) hypertension: Secondary | ICD-10-CM | POA: Diagnosis not present

## 2018-12-31 DIAGNOSIS — Y9389 Activity, other specified: Secondary | ICD-10-CM | POA: Insufficient documentation

## 2018-12-31 DIAGNOSIS — M7918 Myalgia, other site: Secondary | ICD-10-CM

## 2018-12-31 DIAGNOSIS — E119 Type 2 diabetes mellitus without complications: Secondary | ICD-10-CM | POA: Insufficient documentation

## 2018-12-31 DIAGNOSIS — S199XXA Unspecified injury of neck, initial encounter: Secondary | ICD-10-CM | POA: Diagnosis not present

## 2018-12-31 DIAGNOSIS — Y92410 Unspecified street and highway as the place of occurrence of the external cause: Secondary | ICD-10-CM | POA: Diagnosis not present

## 2018-12-31 DIAGNOSIS — M542 Cervicalgia: Secondary | ICD-10-CM | POA: Insufficient documentation

## 2018-12-31 DIAGNOSIS — S4991XA Unspecified injury of right shoulder and upper arm, initial encounter: Secondary | ICD-10-CM | POA: Diagnosis not present

## 2018-12-31 MED ORDER — CYCLOBENZAPRINE HCL 10 MG PO TABS: 10.0000 mg | ORAL_TABLET | Freq: Three times a day (TID) | ORAL | 0 refills | Status: DC | PRN

## 2018-12-31 MED ORDER — IBUPROFEN 600 MG PO TABS: 600.0000 mg | ORAL_TABLET | Freq: Three times a day (TID) | ORAL | 0 refills | Status: DC | PRN

## 2018-12-31 NOTE — ED Notes (Signed)
Pt verbalized understanding of discharge instructions. NAD at this time. 
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
## 2018-12-31 NOTE — Telephone Encounter (Signed)
Noise makes interpretation impossible. Watchful waiting. GT
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
## 2019-01-03 ENCOUNTER — Telehealth: Payer: Self-pay | Admitting: Internal Medicine

## 2019-01-03 NOTE — Telephone Encounter (Signed)
AF alert received with no EGM. Need remote transmission to evaluate alert.

## 2019-01-03 NOTE — Telephone Encounter (Signed)
Pt to send LINQ manual transmission. Pt reports he will send transmission this afternoon when he gets home. To call officeafter remote  Sent.

## 2019-01-04 NOTE — Telephone Encounter (Signed)
Pt wife states pt did the transmission last night. Transmission received 01-03-2019

## 2019-01-04 NOTE — Telephone Encounter (Signed)
LMOVM regarding LINQ alerts. ECGs for pause events suggest undersensing. ECGs for AF events all appear false. Will need DC appt for reprogramming.

## 2019-01-04 NOTE — Telephone Encounter (Signed)
I let the pt wife know per [place], RN phone note that the pt received some false alerts. I told her the he will need a DC appointment to be reprogrammed. I made the appointment for 01-13-2019 at 11:30 am.

## 2019-01-10 ENCOUNTER — Ambulatory Visit (INDEPENDENT_AMBULATORY_CARE_PROVIDER_SITE_OTHER): Payer: PPO | Admitting: *Deleted

## 2019-01-10 DIAGNOSIS — I6310 Cerebral infarction due to embolism of unspecified precerebral artery: Secondary | ICD-10-CM | POA: Diagnosis not present

## 2019-01-10 LAB — CUP PACEART REMOTE DEVICE CHECK
Date Time Interrogation Session: 20200706132835
Implantable Pulse Generator Implant Date: 20200316

## 2019-01-12 ENCOUNTER — Telehealth: Payer: PPO

## 2019-01-12 ENCOUNTER — Telehealth: Payer: Self-pay

## 2019-01-12 DIAGNOSIS — M25512 Pain in left shoulder: Secondary | ICD-10-CM | POA: Diagnosis not present

## 2019-01-12 DIAGNOSIS — M25511 Pain in right shoulder: Secondary | ICD-10-CM | POA: Diagnosis not present

## 2019-01-12 DIAGNOSIS — M19112 Post-traumatic osteoarthritis, left shoulder: Secondary | ICD-10-CM | POA: Diagnosis not present

## 2019-01-12 NOTE — Telephone Encounter (Signed)
    COVID-19 Pre-Screening Questions:  . In the past 7 to 10 days have you had a cough,  shortness of breath, headache, congestion, fever (100 or greater) body aches, chills, sore throat, or sudden loss of taste or sense of smell? No . Have you been around anyone with known Covid 19. No . Have you been around anyone who is awaiting Covid 19 test results in the past 7 to 10 days? NO . Have you been around anyone who has been exposed to Covid 19, or has mentioned symptoms of Covid 19 within the past 7 to 10 days? No  If you have any concerns/questions about symptoms patients report during screening (either on the phone or at threshold). Contact the provider seeing the patient or DOD for further guidance.  If neither are available contact a member of the leadership team.          The pt wife answered No to all Covid-19 prescreening questions. I asked her to make sure he wear a mask for his appointment. I also asked her to have the pt to come to his appointment alone because we are limiting the amount of people coming into the office. The pt wife states the pt is hard of hearing and wants to come to his appointment with him. I told her if he can physically walk into his appointment then he needs to come alone. I told her a nurse could call her after the appointment to let her know what was discuss.

## 2019-01-13 ENCOUNTER — Ambulatory Visit (INDEPENDENT_AMBULATORY_CARE_PROVIDER_SITE_OTHER): Payer: PPO | Admitting: *Deleted

## 2019-01-13 ENCOUNTER — Other Ambulatory Visit: Payer: Self-pay

## 2019-01-13 ENCOUNTER — Telehealth: Payer: Self-pay | Admitting: Internal Medicine

## 2019-01-13 DIAGNOSIS — I639 Cerebral infarction, unspecified: Secondary | ICD-10-CM

## 2019-01-13 LAB — CUP PACEART INCLINIC DEVICE CHECK
Date Time Interrogation Session: 20200709133159
Implantable Pulse Generator Implant Date: 20200316

## 2019-01-13 LAB — CUP PACEART REMOTE DEVICE CHECK
Date Time Interrogation Session: 20200709040500
Implantable Pulse Generator Implant Date: 20200316

## 2019-01-13 NOTE — Progress Notes (Signed)
Loop check in clinic. Battery status: . R-waves 0.12 to .26 mV. 0 symptom episodes, 0  tachy episodes. 13 pause episodes that appear to be undersensing of R-waves, 0 brady episodes. 42 AF episodes (.2% burden) that appear false, AT/AF threshold changed to > 10 minutes.. Sensitivity  increased to 0.06mV to address undersensing and AT/AF detection programmed to less sensitive to decrease alert burden for false events. Remote F/U monthly.

## 2019-01-13 NOTE — Telephone Encounter (Signed)
LMOM for pt to call DC. Need to discuss change in device settings.

## 2019-01-13 NOTE — Telephone Encounter (Signed)
Scheduled for f/u to correct name in ILR.

## 2019-01-14 ENCOUNTER — Telehealth: Payer: Self-pay | Admitting: Cardiology

## 2019-01-14 NOTE — Telephone Encounter (Signed)
Spoke w/ pt wife (DPR on file) and requested that pt send a manual transmission w/ his home monitor.She stated that she would have him send a remote transmission when he gets home on Saturday.  She also requested that someone call her at [phone] to discuss his appt yesterday.

## 2019-01-14 NOTE — Telephone Encounter (Signed)
Called wife per DPR. Discussed appointment from 01/13/19. No changes in meds or tx plan. Informed changes made to settings o ILR to assist in refining what device detects. All questions answered.

## 2019-01-17 ENCOUNTER — Encounter (HOSPITAL_COMMUNITY): Payer: Self-pay | Admitting: Pharmacy Technician

## 2019-01-17 ENCOUNTER — Inpatient Hospital Stay (HOSPITAL_COMMUNITY)
Admission: EM | Admit: 2019-01-17 | Discharge: 2019-01-19 | DRG: 064 | Disposition: A | Discharge status: Home / Self Care | Payer: PPO | Attending: Neurology | Admitting: Neurology

## 2019-01-17 ENCOUNTER — Inpatient Hospital Stay (HOSPITAL_COMMUNITY): Payer: PPO

## 2019-01-17 ENCOUNTER — Emergency Department (HOSPITAL_COMMUNITY): Payer: PPO

## 2019-01-17 DIAGNOSIS — Z1159 Encounter for screening for other viral diseases: Secondary | ICD-10-CM | POA: Diagnosis not present

## 2019-01-17 DIAGNOSIS — R059 Cough, unspecified: Secondary | ICD-10-CM

## 2019-01-17 DIAGNOSIS — G8191 Hemiplegia, unspecified affecting right dominant side: Secondary | ICD-10-CM | POA: Diagnosis not present

## 2019-01-17 DIAGNOSIS — Z955 Presence of coronary angioplasty implant and graft: Secondary | ICD-10-CM

## 2019-01-17 DIAGNOSIS — E119 Type 2 diabetes mellitus without complications: Secondary | ICD-10-CM | POA: Diagnosis not present

## 2019-01-17 DIAGNOSIS — Z8614 Personal history of Methicillin resistant Staphylococcus aureus infection: Secondary | ICD-10-CM | POA: Diagnosis not present

## 2019-01-17 DIAGNOSIS — E785 Hyperlipidemia, unspecified: Secondary | ICD-10-CM | POA: Diagnosis not present

## 2019-01-17 DIAGNOSIS — I618 Other nontraumatic intracerebral hemorrhage: Secondary | ICD-10-CM | POA: Diagnosis present

## 2019-01-17 DIAGNOSIS — I639 Cerebral infarction, unspecified: Secondary | ICD-10-CM | POA: Diagnosis not present

## 2019-01-17 DIAGNOSIS — Z66 Do not resuscitate: Secondary | ICD-10-CM | POA: Diagnosis present

## 2019-01-17 DIAGNOSIS — J45909 Unspecified asthma, uncomplicated: Secondary | ICD-10-CM | POA: Diagnosis present

## 2019-01-17 DIAGNOSIS — Z791 Long term (current) use of non-steroidal anti-inflammatories (NSAID): Secondary | ICD-10-CM

## 2019-01-17 DIAGNOSIS — E1165 Type 2 diabetes mellitus with hyperglycemia: Secondary | ICD-10-CM | POA: Diagnosis present

## 2019-01-17 DIAGNOSIS — R4701 Aphasia: Secondary | ICD-10-CM | POA: Diagnosis present

## 2019-01-17 DIAGNOSIS — Z8249 Family history of ischemic heart disease and other diseases of the circulatory system: Secondary | ICD-10-CM | POA: Diagnosis not present

## 2019-01-17 DIAGNOSIS — Z8673 Personal history of transient ischemic attack (TIA), and cerebral infarction without residual deficits: Secondary | ICD-10-CM

## 2019-01-17 DIAGNOSIS — Z20828 Contact with and (suspected) exposure to other viral communicable diseases: Secondary | ICD-10-CM | POA: Diagnosis not present

## 2019-01-17 DIAGNOSIS — K219 Gastro-esophageal reflux disease without esophagitis: Secondary | ICD-10-CM | POA: Diagnosis present

## 2019-01-17 DIAGNOSIS — I63532 Cerebral infarction due to unspecified occlusion or stenosis of left posterior cerebral artery: Secondary | ICD-10-CM | POA: Diagnosis not present

## 2019-01-17 DIAGNOSIS — I2510 Atherosclerotic heart disease of native coronary artery without angina pectoris: Secondary | ICD-10-CM | POA: Diagnosis present

## 2019-01-17 DIAGNOSIS — Z823 Family history of stroke: Secondary | ICD-10-CM

## 2019-01-17 DIAGNOSIS — E1151 Type 2 diabetes mellitus with diabetic peripheral angiopathy without gangrene: Secondary | ICD-10-CM | POA: Diagnosis present

## 2019-01-17 DIAGNOSIS — Z87891 Personal history of nicotine dependence: Secondary | ICD-10-CM | POA: Diagnosis not present

## 2019-01-17 DIAGNOSIS — G4733 Obstructive sleep apnea (adult) (pediatric): Secondary | ICD-10-CM | POA: Diagnosis not present

## 2019-01-17 DIAGNOSIS — Z79899 Other long term (current) drug therapy: Secondary | ICD-10-CM

## 2019-01-17 DIAGNOSIS — R471 Dysarthria and anarthria: Secondary | ICD-10-CM | POA: Diagnosis present

## 2019-01-17 DIAGNOSIS — E1159 Type 2 diabetes mellitus with other circulatory complications: Secondary | ICD-10-CM | POA: Diagnosis not present

## 2019-01-17 DIAGNOSIS — R29707 NIHSS score 7: Secondary | ICD-10-CM | POA: Diagnosis not present

## 2019-01-17 DIAGNOSIS — Z8546 Personal history of malignant neoplasm of prostate: Secondary | ICD-10-CM

## 2019-01-17 DIAGNOSIS — Z800 Family history of malignant neoplasm of digestive organs: Secondary | ICD-10-CM

## 2019-01-17 DIAGNOSIS — R278 Other lack of coordination: Secondary | ICD-10-CM | POA: Diagnosis not present

## 2019-01-17 DIAGNOSIS — Z833 Family history of diabetes mellitus: Secondary | ICD-10-CM | POA: Diagnosis not present

## 2019-01-17 DIAGNOSIS — I611 Nontraumatic intracerebral hemorrhage in hemisphere, cortical: Secondary | ICD-10-CM | POA: Diagnosis present

## 2019-01-17 DIAGNOSIS — I63412 Cerebral infarction due to embolism of left middle cerebral artery: Principal | ICD-10-CM | POA: Diagnosis present

## 2019-01-17 DIAGNOSIS — D473 Essential (hemorrhagic) thrombocythemia: Secondary | ICD-10-CM | POA: Diagnosis present

## 2019-01-17 DIAGNOSIS — Z9884 Bariatric surgery status: Secondary | ICD-10-CM | POA: Diagnosis not present

## 2019-01-17 DIAGNOSIS — Z96611 Presence of right artificial shoulder joint: Secondary | ICD-10-CM | POA: Diagnosis present

## 2019-01-17 DIAGNOSIS — E1136 Type 2 diabetes mellitus with diabetic cataract: Secondary | ICD-10-CM | POA: Diagnosis not present

## 2019-01-17 DIAGNOSIS — Z7984 Long term (current) use of oral hypoglycemic drugs: Secondary | ICD-10-CM

## 2019-01-17 DIAGNOSIS — R05 Cough: Secondary | ICD-10-CM

## 2019-01-17 DIAGNOSIS — M1990 Unspecified osteoarthritis, unspecified site: Secondary | ICD-10-CM | POA: Diagnosis present

## 2019-01-17 DIAGNOSIS — I10 Essential (primary) hypertension: Secondary | ICD-10-CM | POA: Diagnosis present

## 2019-01-17 DIAGNOSIS — I619 Nontraumatic intracerebral hemorrhage, unspecified: Secondary | ICD-10-CM | POA: Diagnosis not present

## 2019-01-17 DIAGNOSIS — I160 Hypertensive urgency: Secondary | ICD-10-CM | POA: Diagnosis present

## 2019-01-17 DIAGNOSIS — Z8349 Family history of other endocrine, nutritional and metabolic diseases: Secondary | ICD-10-CM

## 2019-01-17 DIAGNOSIS — I6340 Cerebral infarction due to embolism of unspecified cerebral artery: Secondary | ICD-10-CM | POA: Diagnosis not present

## 2019-01-17 DIAGNOSIS — Z96653 Presence of artificial knee joint, bilateral: Secondary | ICD-10-CM | POA: Diagnosis present

## 2019-01-17 LAB — URINALYSIS, ROUTINE W REFLEX MICROSCOPIC
Bilirubin Urine: NEGATIVE
Glucose, UA: NEGATIVE mg/dL
Hgb urine dipstick: NEGATIVE
Ketones, ur: NEGATIVE mg/dL
Leukocytes,Ua: NEGATIVE
Nitrite: NEGATIVE
Protein, ur: NEGATIVE mg/dL
Specific Gravity, Urine: 1.004 — ABNORMAL LOW (ref 1.005–1.030)
pH: 6 (ref 5.0–8.0)

## 2019-01-17 LAB — DIFFERENTIAL
Abs Immature Granulocytes: 0.03 10*3/uL (ref 0.00–0.07)
Basophils Absolute: 0 10*3/uL (ref 0.0–0.1)
Basophils Relative: 0 %
Eosinophils Absolute: 0.2 10*3/uL (ref 0.0–0.5)
Eosinophils Relative: 3 %
Immature Granulocytes: 1 %
Lymphocytes Relative: 30 %
Lymphs Abs: 1.6 10*3/uL (ref 0.7–4.0)
Monocytes Absolute: 0.5 10*3/uL (ref 0.1–1.0)
Monocytes Relative: 9 %
Neutro Abs: 3.2 10*3/uL (ref 1.7–7.7)
Neutrophils Relative %: 57 %

## 2019-01-17 LAB — CBC
HCT: 44.3 % (ref 39.0–52.0)
Hemoglobin: 14.5 g/dL (ref 13.0–17.0)
MCH: 31.4 pg (ref 26.0–34.0)
MCHC: 32.7 g/dL (ref 30.0–36.0)
MCV: 95.9 fL (ref 80.0–100.0)
Platelets: 155 10*3/uL (ref 150–400)
RBC: 4.62 MIL/uL (ref 4.22–5.81)
RDW: 12.2 % (ref 11.5–15.5)
WBC: 5.5 10*3/uL (ref 4.0–10.5)
nRBC: 0 % (ref 0.0–0.2)

## 2019-01-17 LAB — COMPREHENSIVE METABOLIC PANEL
ALT: 36 U/L (ref 0–44)
AST: 26 U/L (ref 15–41)
Albumin: 4.2 g/dL (ref 3.5–5.0)
Alkaline Phosphatase: 61 U/L (ref 38–126)
Anion gap: 10 (ref 5–15)
BUN: 27 mg/dL — ABNORMAL HIGH (ref 8–23)
CO2: 24 mmol/L (ref 22–32)
Calcium: 9.3 mg/dL (ref 8.9–10.3)
Chloride: 102 mmol/L (ref 98–111)
Creatinine, Ser: 1.22 mg/dL (ref 0.61–1.24)
GFR calc Af Amer: >60 mL/min (ref >60)
GFR calc non Af Amer: >60 mL/min (ref >60)
Glucose, Bld: 215 mg/dL — ABNORMAL HIGH (ref 70–99)
Potassium: 4.4 mmol/L (ref 3.5–5.1)
Sodium: 136 mmol/L (ref 135–145)
Total Bilirubin: 0.9 mg/dL (ref 0.3–1.2)
Total Protein: 6.5 g/dL (ref 6.5–8.1)

## 2019-01-17 LAB — I-STAT CHEM 8, ED
BUN: 29 mg/dL — ABNORMAL HIGH (ref 8–23)
Calcium, Ion: 1.15 mmol/L (ref 1.15–1.40)
Chloride: 102 mmol/L (ref 98–111)
Creatinine, Ser: 1.1 mg/dL (ref 0.61–1.24)
Glucose, Bld: 208 mg/dL — ABNORMAL HIGH (ref 70–99)
HCT: 43 % (ref 39.0–52.0)
Hemoglobin: 14.6 g/dL (ref 13.0–17.0)
Potassium: 4.2 mmol/L (ref 3.5–5.1)
Sodium: 136 mmol/L (ref 135–145)
TCO2: 24 mmol/L (ref 22–32)

## 2019-01-17 LAB — LIPID PANEL
Cholesterol: 113 mg/dL (ref 0–200)
HDL: 51 mg/dL (ref >40)
LDL Cholesterol: 22 mg/dL (ref 0–99)
Total CHOL/HDL Ratio: 2.2 RATIO
Triglycerides: 202 mg/dL — ABNORMAL HIGH (ref <150)
VLDL: 40 mg/dL (ref 0–40)

## 2019-01-17 LAB — HEMOGLOBIN A1C
Hgb A1c MFr Bld: 8 % — ABNORMAL HIGH (ref 4.8–5.6)
Mean Plasma Glucose: 182.9 mg/dL

## 2019-01-17 LAB — SARS CORONAVIRUS 2 BY RT PCR (HOSPITAL ORDER, PERFORMED IN ~~LOC~~ HOSPITAL LAB): SARS Coronavirus 2: NEGATIVE

## 2019-01-17 LAB — PROTIME-INR
INR: 1 (ref 0.8–1.2)
Prothrombin Time: 13.3 seconds (ref 11.4–15.2)

## 2019-01-17 LAB — MRSA PCR SCREENING: MRSA by PCR: POSITIVE — AB

## 2019-01-17 LAB — GLUCOSE, CAPILLARY
Glucose-Capillary: 166 mg/dL — ABNORMAL HIGH (ref 70–99)
Glucose-Capillary: 193 mg/dL — ABNORMAL HIGH (ref 70–99)

## 2019-01-17 LAB — ETHANOL: Alcohol, Ethyl (B): <10 mg/dL (ref <10)

## 2019-01-17 LAB — APTT: aPTT: 26 seconds (ref 24–36)

## 2019-01-17 LAB — CBG MONITORING, ED: Glucose-Capillary: 197 mg/dL — ABNORMAL HIGH (ref 70–99)

## 2019-01-17 MED ORDER — ACETAMINOPHEN 325 MG PO TABS
650.0000 mg | ORAL_TABLET | ORAL | Status: DC | PRN
  Administered 2019-01-17: 650 mg via ORAL
  Filled 2019-01-17: qty 2

## 2019-01-17 MED ORDER — HYDRALAZINE HCL 20 MG/ML IJ SOLN
INTRAMUSCULAR | Status: AC
  Filled 2019-01-17: qty 1

## 2019-01-17 MED ORDER — ACETAMINOPHEN 160 MG/5ML PO SOLN: 650.0000 mg | ORAL | Status: DC | PRN

## 2019-01-17 MED ORDER — LABETALOL HCL 5 MG/ML IV SOLN
10.0000 mg | INTRAVENOUS | Status: DC | PRN
  Administered 2019-01-18: 10 mg via INTRAVENOUS
  Filled 2019-01-17: qty 4

## 2019-01-17 MED ORDER — CLEVIDIPINE BUTYRATE 0.5 MG/ML IV EMUL: 0.0000 mg/h | INTRAVENOUS | Status: DC

## 2019-01-17 MED ORDER — ACETAMINOPHEN 650 MG RE SUPP: 650.0000 mg | RECTAL | Status: DC | PRN

## 2019-01-17 MED ORDER — CHLORHEXIDINE GLUCONATE CLOTH 2 % EX PADS
6.0000 | MEDICATED_PAD | Freq: Every day | CUTANEOUS | Status: DC
  Administered 2019-01-18: 6 via TOPICAL

## 2019-01-17 MED ORDER — SENNOSIDES-DOCUSATE SODIUM 8.6-50 MG PO TABS
1.0000 | ORAL_TABLET | Freq: Two times a day (BID) | ORAL | Status: DC
  Administered 2019-01-18 – 2019-01-19 (×3): 1 via ORAL
  Filled 2019-01-17 (×3): qty 1

## 2019-01-17 MED ORDER — INSULIN ASPART 100 UNIT/ML ~~LOC~~ SOLN
0.0000 [IU] | SUBCUTANEOUS | Status: DC
  Administered 2019-01-17: 3 [IU] via SUBCUTANEOUS

## 2019-01-17 MED ORDER — PANTOPRAZOLE SODIUM 40 MG PO TBEC
40.0000 mg | DELAYED_RELEASE_TABLET | Freq: Every day | ORAL | Status: DC
  Administered 2019-01-17 – 2019-01-19 (×3): 40 mg via ORAL
  Filled 2019-01-17 (×3): qty 1

## 2019-01-17 MED ORDER — PANTOPRAZOLE SODIUM 40 MG IV SOLR: 40.0000 mg | Freq: Every day | INTRAVENOUS | Status: DC

## 2019-01-17 MED ORDER — HYDRALAZINE HCL 20 MG/ML IJ SOLN
10.0000 mg | INTRAMUSCULAR | Status: DC | PRN
  Administered 2019-01-17 (×2): 10 mg via INTRAVENOUS
  Filled 2019-01-17: qty 1

## 2019-01-17 MED ORDER — STROKE: EARLY STAGES OF RECOVERY BOOK
Freq: Once | Status: AC
  Administered 2019-01-17: 23:00:00
  Filled 2019-01-17: qty 1

## 2019-01-17 MED ORDER — INSULIN ASPART 100 UNIT/ML ~~LOC~~ SOLN
0.0000 [IU] | Freq: Three times a day (TID) | SUBCUTANEOUS | Status: DC
  Administered 2019-01-17: 3 [IU] via SUBCUTANEOUS
  Administered 2019-01-18: 8 [IU] via SUBCUTANEOUS
  Administered 2019-01-18: 2 [IU] via SUBCUTANEOUS
  Administered 2019-01-18: 5 [IU] via SUBCUTANEOUS
  Administered 2019-01-19 (×2): 2 [IU] via SUBCUTANEOUS
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
## 2019-01-17 NOTE — ED Triage Notes (Signed)
Pt bib wife with expressive aphasia and difficulty ambulating. Code stroke activated by triage RN.

## 2019-01-17 NOTE — Progress Notes (Signed)
RT set up CPAP and placed on patient. Patient is tolerating settings well at this time. No distress noted. RT will monitor as needed.

## 2019-01-17 NOTE — ED Provider Notes (Signed)
[facility] EMERGENCY DEPARTMENT Provider Note   CSN: [id number] Arrival date & time: 01/17/19  1454     History   Chief Complaint Chief Complaint  Patient presents with  . Code Stroke    HPI Andrew Cobb is a 68 y.o. male.     HPI Patient is a 68 year old male with past medical history of CAD, DM, GERD, HLD, asthma, remote prostate cancer and CVA (earlier this year) who presents to the emergency department today for acute onset right-sided weakness and word finding difficulty. Andrew Cobb wife reports that she received a phone call from one of the patient's coworkers at around 1400 hours today, reporting that the patient was suddenly "talking out of his head."  The coworker drove the patient back to their workplace, where his wife met them and confirms that he was speaking nonsensically, she then drove him here to the ED where he was evaluated at triage. Attempts at obtaining further hx from Andrew Cobb are quite difficult, as he does have significant difficulty in finding his words.    Past Medical History:  Diagnosis Date  . Arthritis   . Asthma   . Cancer [facility])    prostate  . Chronic venous insufficiency    varicose vein lower extremity with inflammation  . Coronary artery disease 1996   two stents placed   . Diabetes mellitus without complication ([place])    type 2 on metformin  . GERD (gastroesophageal reflux disease)    no issues since gastric bypass surgery as stated per pt  . Hyperlipidemia   . Hypogonadism in male   . MRSA (methicillin resistant Staphylococcus aureus) infection    07/30/2008 thru 08/07/2008  . Sleep apnea    on BIPAP  . Stented coronary artery   . Stroke ([place])   . Thrombocythemia [facility])     Patient Active Problem List   Diagnosis Date Noted  . CVA (cerebral vascular accident) ([place]) 09/18/2018  . Coronary artery disease involving native coronary artery of native heart without angina pectoris   . H/O right coronary artery  stent placement   . Allergic rhinitis 08/19/2018  . Cervical radiculopathy 08/17/2017  . Advanced care planning/counseling discussion 07/21/2017  . Skin lesion of right arm 12/16/2016  . Prostate cancer ([name]) 12/08/2016  . H/O bariatric surgery 07/17/2016  . Essential hypertension 04/24/2015  . Diabetes mellitus associated with hormonal etiology ([place]) 04/24/2015  . Chronic sinusitis 08/13/2009  . Hyperlipidemia 04/02/2009  . Coronary atherosclerosis 04/02/2009  . 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 OSA (obstructive sleep apnea) 04/02/2009  . Arthritis of shoulder region, degenerative 04/02/2009    Past Surgical History:  Procedure Laterality Date  . ANGIOPLASTY    . ANGIOPLASTY     with stent 04/07/1995  . ANTERIOR CERVICAL DECOMPRESSION/DISCECTOMY FUSION 4 LEVELS N/A 08/17/2017   Procedure: Anterior discectomy with fusion and plate fixation Cervical Three-Four, Four-Five, Five-Six, and Six-Seven Fusion;  Surgeon: Ditty, Kevan Ny, MD;  Location: [facility];  Service: Neurosurgery;  Laterality: N/A;  Anterior discectomy with fusion and plate fixation Cervical Three-Four, Four-Five, Five-Six, and Six-Seven Fusion   . APPENDECTOMY     1966  . BUBBLE STUDY  09/20/2018   Procedure: BUBBLE STUDY;  Surgeon: Josue Hector, MD;  Location: [facility];  Service: Cardiovascular;;  . [place]  . CARDIOVASCULAR STRESS TEST     07/31/2011  . CARPAL TUNNEL RELEASE Left   . CHOLECYSTECTOMY     2006  . colonscopy  08/25/2012  . EYE SURGERY Bilateral    cataract  . FUNCTIONAL ENDOSCOPIC SINUS SURGERY     11/10/2013  . GASTRIC BYPASS     10/05/2012  . HERNIA REPAIR     left inguinal 1981  . INCISION AND DRAINAGE ABSCESS Right 02/26/2017   Procedure: INCISION AND DRAINAGE ABSCESS;  Surgeon: Nickie Retort, MD;  Location: [facility] ORS;  Service: Urology;  Laterality: Right;  . JOINT REPLACEMENT     bilateral  . left ankle surgery      05/03/2003   .  left carpel tunnel      09/18/1993  . left knee meniscal tear      01/25/2010  . left knee meniscal tear repair      05/04/1996  . left rotator cuff repair      05/03/2003   . LOOP RECORDER INSERTION N/A 09/20/2018   Procedure: LOOP RECORDER INSERTION;  Surgeon: Evans Lance, MD;  Location: [facility] CV LAB;  Service: Cardiovascular;  Laterality: N/A;  . REPLACEMENT TOTAL KNEE BILATERAL  07/13/2015  . right ankle surgery      fracture has 2 screws 07/07/1997  . right carpel tunnel      05/16/1992  . right shoulder replacement      01/27/2006  . SCROTAL EXPLORATION Right 02/26/2017   Procedure: SCROTUM EXPLORATION;  Surgeon: Nickie Retort, MD;  Location: [facility] OR[facility];  Service: Urology;  Laterality: Right;  . TEE WITHOUT CARDIOVERSION N/A 09/20/2018   Procedure: TRANSESOPHAGEAL ECHOCARDIOGRAM (TEE);  Surgeon: Josue Hector, MD;  Location: [facility] ENDOSCOPY;  Service: Cardiovascular;  Laterality: N/A;  . TOTAL KNEE ARTHROPLASTY Left 07/13/2015   Procedure: LEFT TOTAL KNEE ARTHROPLASTY;  Surgeon: Gaynelle Arabian, MD;  Location: [facility];  Service: Orthopedics;  Laterality: Left;        Home Medications    Prior to Admission medications   Medication Sig Start Date End Date Taking? Authorizing Provider  albuterol (PROVENTIL HFA;VENTOLIN HFA) 108 (90 Base) MCG/ACT inhaler Inhale 1 puff into the lungs every 6 (six) hours as needed for wheezing.     [provider]  amitriptyline (ELAVIL) 25 MG tablet Take 1 tablet (25 mg total) by mouth at bedtime. 10/31/18   Guadalupe Maple, MD  atorvastatin (LIPITOR) 40 MG tablet Take 1 tablet (40 mg total) by mouth daily. 10/18/18   Guadalupe Maple, MD  benazepril (LOTENSIN) 40 MG tablet Take 1 tablet (40 mg total) by mouth daily. 12/08/18   Guadalupe Maple, MD  cetirizine (ZYRTEC) 10 MG tablet Take 10 mg by mouth daily.    [provider]  Cholecalciferol (VITAMIN D) 2000 units CAPS Take 2,000 Units by mouth daily.    [provider]   cyclobenzaprine (FLEXERIL) 10 MG tablet Take 1 tablet (10 mg total) by mouth 3 (three) times daily as needed. 12/31/18   Sable Feil, PA-C  glucose blood test strip 1 each by Other route 2 (two) times daily. DX E11.9 12/30/18   Park Liter P, DO  ibuprofen (ADVIL) 600 MG tablet Take 1 tablet (600 mg total) by mouth every 8 (eight) hours as needed. 12/31/18   Sable Feil, PA-C  Lancets [facility] ULTRASOFT) lancets 1 each by Other route 2 (two) times daily. Dx E11.9 04/24/15   Arlis Porta., MD  Magnesium 250 MG TABS Take 250 mg by mouth at bedtime.     [provider]  metFORMIN (GLUCOPHAGE) 500 MG tablet Take 1 tablet (500 mg total) by  mouth 2 (two) times daily with a meal. 10/18/18   Crissman, Jeannette How, MD  Multiple Vitamins-Minerals (MULTIVITAMIN PO) Take 1 tablet by mouth daily.     [provider]  SUMAtriptan (IMITREX) 100 MG tablet Take 1 tablet (100 mg total) by mouth daily as needed for migraine. May repeat in 2 hours if headache persists or recurs. 10/01/18   Mosetta Anis, MD  tobramycin (TOBREX) 0.3 % ophthalmic solution INSTILL 1 DROP INTO RIGHT EYE 4 TIMES DAILY 09/16/18   [provider]  valACYclovir (VALTREX) 500 MG tablet Take 1 tablet (500 mg total) by mouth daily. 10/18/18   Guadalupe Maple, MD    Family History Family History  Problem Relation Age of Onset  . Cancer Mother        pancreatic  . Diabetes Mother   . Stroke Mother   . Heart disease Mother   . Hyperlipidemia Mother   . Hypertension Mother   . Heart attack Mother   . Heart disease Father   . Stroke Father   . Diabetes Father   . Hypertension Father   . Heart attack Father   . Hyperlipidemia Father   . Pancreatic cancer Father   . Cancer Sister   . Cancer Brother        lung  . Cancer Brother   . Kidney cancer Neg Hx   . Bladder Cancer Neg Hx   . Prostate cancer Neg Hx     Social History Social History   Tobacco Use  . Smoking status: Former Smoker     Packs/day: 1.00    Years: 10.00    Pack years: 10.00    Types: Cigarettes    Quit date: 07/07/1984    Years since quitting: 34.5  . Smokeless tobacco: Never Used  . Tobacco comment: quit 1986  Substance Use Topics  . Alcohol use: No    Alcohol/week: 0.0 standard drinks  . Drug use: No     Allergies   Succinylsulphathiazole, Sulfamethoxazole-trimethoprim, and Tetracyclines & related   Review of Systems Review of Systems  Constitutional: Negative for chills and fever.  HENT: Negative for ear pain and sore throat.   Eyes: Negative for pain and visual disturbance.  Respiratory: Negative for cough and shortness of breath.   Cardiovascular: Negative for chest pain and palpitations.  Gastrointestinal: Negative for abdominal pain and vomiting.  Genitourinary: Negative for dysuria and hematuria.  Musculoskeletal: Negative for arthralgias and back pain.  Skin: Negative for color change and rash.  Neurological: Positive for speech difficulty (according to his wife he is not making any sense, difficulty finding words ), weakness (right hemibody) and headaches. Negative for seizures and syncope.  All other systems reviewed and are negative.    Physical Exam Updated Vital Signs There were no vitals taken for this visit.  Physical Exam Vitals signs and nursing note reviewed.  Constitutional:      Appearance: Normal appearance. He is well-developed.  HENT:     Head: Normocephalic and atraumatic.  Eyes:     Conjunctiva/sclera: Conjunctivae normal.  Neck:     Musculoskeletal: Neck supple.  Cardiovascular:     Rate and Rhythm: Normal rate and regular rhythm.     Heart sounds: No murmur.  Pulmonary:     Effort: Pulmonary effort is normal. No respiratory distress.     Breath sounds: Normal breath sounds.  Abdominal:     Palpations: Abdomen is soft.     Tenderness: There is no abdominal tenderness.  Skin:    General: Skin is warm and dry.  Neurological:     Mental Status: He is  alert.     Motor: Weakness present.     Comments:  Mental Status: Awake, alert and oriented to person and place only. Further history rather difficult as he has evidence of both receptive and expressive aphasia.   Cranial Nerves:  II: visual fields full III, IV, VI:  Pupils equal, round and reactive to light . Full eye movements without nystagmus. V, VII: Facial sensation intact bilaterally. No weakness of masticatory muscles and smile is symmetric VIII Auditory Acuity: difficulty hearing at baseline, but reportedly worse here today IX/X:  the palate elevates symmetrically & uvula is midline XI: shoulder shrug symmetric  XII: The tongue protrudes midline.    Motor System:  Muscle Strength: 5/5 in the LEFT upper and lower extremities while 4/5 in the RIGHT upper and lower extremities.  Muscle Tone: Tone and muscle bulk are symmetric in the upper and lower extremities.   Reflexes: not assessed by this physician, further neuro exam by Neurology stroke team  Coordination: No tremor. Finger-to-nose testing appears intact  Sensation: Intact to light touch Gait: deferred      ED Treatments / Results  Labs (all labs ordered are listed, but only abnormal results are displayed) Labs Reviewed  I-STAT CHEM 8, ED - Abnormal; Notable for the following components:      Result Value   BUN 29 (*)    Glucose, Bld 208 (*)    All other components within normal limits  CBG MONITORING, ED - Abnormal; Notable for the following components:   Glucose-Capillary 197 (*)    All other components within normal limits  CBC  DIFFERENTIAL  ETHANOL  PROTIME-INR  APTT  COMPREHENSIVE METABOLIC PANEL  RAPID URINE DRUG SCREEN, HOSP PERFORMED  URINALYSIS, ROUTINE W REFLEX MICROSCOPIC    EKG EKG Interpretation  Date/Time:  Monday January 17 2019 15:20:13 EDT Ventricular Rate:  60 PR Interval:    QRS Duration: 108 QT Interval:  427 QTC Calculation: 427 R Axis:   11 Text Interpretation:  Sinus rhythm  Borderline prolonged PR interval No significant change since last tracing Baseline wander Confirmed by Lajean Saver [phone]) on 01/17/2019 4:09:56 PM   Radiology Ct Head Code Stroke Wo Contrast  Result Date: 01/17/2019 CLINICAL DATA:  Code stroke. Difficulty with speech. History of stroke. EXAM: CT HEAD WITHOUT CONTRAST TECHNIQUE: Contiguous axial images were obtained from the base of the skull through the vertex without intravenous contrast. COMPARISON:  CT head 10/01/2018.  MRI head 09/18/2018 FINDINGS: Brain: Extension of hemorrhagic infarct in the left parietal lobe compared with the prior study. The hemorrhagic infarction is in the exact same location but is more prominent in size on today's study. No other areas of acute infarct Extensive chronic ischemic changes throughout the white matter. Chronic infarct left frontal lobe as noted previously. Mild atrophy without hydrocephalus. No mass or midline shift. Vascular: Negative for hyperdense vessel Skull: Negative Sinuses/Orbits: Mild mucosal edema in the ethmoid sinuses. Bilateral cataract surgery. Other: None ASPECTS ([place] Stroke Program Early CT Score) Aspect score not applicable. Hemorrhagic infarct on the left as above. IMPRESSION: 1. 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 This has progressed since the prior CT of 10/01/2018. Differential diagnosis includes recurrent emboli, intracranial stenosis, cerebral amyloid. 2. These results were called by telephone at the time of interpretation on 01/17/2019 at 3:26 pm to Dr. Rory Percy, who verbally acknowledged these results. Electronically  Signed   By: Franchot Gallo M.D.   On: 01/17/2019 15:27    Procedures Procedures (including critical care time)  Medications Ordered in ED Medications  hydrALAZINE (APRESOLINE) 20 MG/ML injection (has no administration in time range)   Of note, this patient was evaluated in the Emergency Department for the symptoms described in the  history of present illness. He was evaluated in the context of the global COVID-19 pandemic, which necessitated consideration that the patient might be at risk for infection with the SARS-CoV-2 virus that causes COVID-19. Institutional protocols and algorithms that pertain to the evaluation of patients at risk for COVID-19 are in a state of rapid change based on information released by regulatory bodies including the CDC and federal and state organizations. These policies and algorithms were followed during the patient's care in the ED.  During this patient encounter, the patient was wearing a mask, and throughout this encounter I was wearing at least a surgical mask.  I was not within 6 feet of this patient for more than 15 minutes without eye protection when they were not wearing mask.    Initial Impression / Assessment and Plan / ED Course  I have reviewed the triage vital signs and the nursing notes.  Pertinent labs & imaging results that were available during my care of the patient were reviewed by me and considered in my medical decision making (see chart for details).  Differentials considered: acute ischemic CVA, acute hemorrhagic CVA, TIA, meningoencephalitis  EM Physician interpretation of Labs & Imaging: . Hyperglycemia is present without acidosis . Acute hemorrhagic infarct in the left posterior temporal and parietal lobe  Medical Decision Making:  Andrew Cobb is a 68 y.o. male with the above past medical history who presented to the emergency department today for evaluation of acute onset receptive and expressive aphasia, and right hemibody weakness.  He arrived by POV, brought by his wife and was appropriately triaged as a CODE STROKE.  The CT scanner was made available and the patient was urgently brought to the CT scanner where he was met by the neurology-stroke team and underwent CT imaging.  CT head Noncon showed evidence of acute hemorrhage in the left posterior temporal and  parietal lobe.  Patient given IV hydralazine for blood pressure target goal less than 140.  Patient will be admitted to the neurology-stroke service, ICU status and will undergo MRI imaging.   CLINICAL IMPRESSION: 1. Hemorrhagic stroke ([place])   2. Cough      Disposition: Admit  The plan for this patient was discussed with my attending physician, Dr. Lajean Saver, who voiced agreement and who oversaw evaluation and treatment of this patient.   Constanza Mincy A. Jimmye Norman, MD Resident Physician, PGY-3 Emergency Medicine [facility] of Medicine    Jefm Petty, MD 01/18/19 1436    Lajean Saver, MD 01/18/19 [phone]

## 2019-01-18 ENCOUNTER — Inpatient Hospital Stay (HOSPITAL_COMMUNITY): Payer: PPO

## 2019-01-18 DIAGNOSIS — I63412 Cerebral infarction due to embolism of left middle cerebral artery: Principal | ICD-10-CM

## 2019-01-18 DIAGNOSIS — Z8673 Personal history of transient ischemic attack (TIA), and cerebral infarction without residual deficits: Secondary | ICD-10-CM

## 2019-01-18 DIAGNOSIS — I10 Essential (primary) hypertension: Secondary | ICD-10-CM

## 2019-01-18 DIAGNOSIS — I619 Nontraumatic intracerebral hemorrhage, unspecified: Secondary | ICD-10-CM

## 2019-01-18 DIAGNOSIS — E1159 Type 2 diabetes mellitus with other circulatory complications: Secondary | ICD-10-CM

## 2019-01-18 LAB — GLUCOSE, CAPILLARY
Glucose-Capillary: 135 mg/dL — ABNORMAL HIGH (ref 70–99)
Glucose-Capillary: 209 mg/dL — ABNORMAL HIGH (ref 70–99)
Glucose-Capillary: 264 mg/dL — ABNORMAL HIGH (ref 70–99)
Glucose-Capillary: 108 mg/dL — ABNORMAL HIGH (ref 70–99)

## 2019-01-18 MED ORDER — LABETALOL HCL 5 MG/ML IV SOLN: 5.0000 mg | INTRAVENOUS | Status: DC | PRN

## 2019-01-18 MED ORDER — AMITRIPTYLINE HCL 25 MG PO TABS
25.0000 mg | ORAL_TABLET | Freq: Every day | ORAL | Status: DC
  Administered 2019-01-18: 25 mg via ORAL
  Filled 2019-01-18: qty 1

## 2019-01-18 MED ORDER — MUPIROCIN 2 % EX OINT
1.0000 "application " | TOPICAL_OINTMENT | Freq: Two times a day (BID) | CUTANEOUS | Status: DC
  Administered 2019-01-18 – 2019-01-19 (×3): 1 via NASAL
  Filled 2019-01-18: qty 22

## 2019-01-18 MED ORDER — SODIUM CHLORIDE 0.9 % IV SOLN
INTRAVENOUS | Status: DC
  Administered 2019-01-18: 01:00:00 via INTRAVENOUS

## 2019-01-18 MED ORDER — IOHEXOL 350 MG/ML SOLN
75.0000 mL | Freq: Once | INTRAVENOUS | Status: AC | PRN
  Administered 2019-01-18: 100 mL via INTRAVENOUS

## 2019-01-18 MED ORDER — BENAZEPRIL HCL 20 MG PO TABS: 40.0000 mg | ORAL_TABLET | Freq: Every day | ORAL | Status: DC

## 2019-01-18 MED ORDER — VITAMIN D 25 MCG (1000 UNIT) PO TABS
2000.0000 [IU] | ORAL_TABLET | Freq: Every day | ORAL | Status: DC
  Administered 2019-01-18 – 2019-01-19 (×2): 2000 [IU] via ORAL
  Filled 2019-01-18 (×2): qty 2

## 2019-01-18 MED ORDER — HYDRALAZINE HCL 20 MG/ML IJ SOLN: 10.0000 mg | INTRAMUSCULAR | Status: DC | PRN

## 2019-01-18 MED ORDER — GADOBUTROL 1 MMOL/ML IV SOLN
9.3000 mL | Freq: Once | INTRAVENOUS | Status: AC | PRN
  Administered 2019-01-18: 9.3 mL via INTRAVENOUS

## 2019-01-18 MED ORDER — BENAZEPRIL HCL 20 MG PO TABS
20.0000 mg | ORAL_TABLET | Freq: Every day | ORAL | Status: DC
  Administered 2019-01-18 – 2019-01-19 (×2): 20 mg via ORAL
  Filled 2019-01-18 (×2): qty 1

## 2019-01-18 NOTE — Progress Notes (Signed)
MRI brain completed. Images reviewed and discussed with Neuroradiology. There is an area of acute/subacute ischemic stroke adjacent to the known left parieto-temporal subacute hemorrhagic stroke; this most likely was present at the time of the CT scan performed earlier today, but not detectable on CT at that time due to acuity.  Will need MRA head to further evaluate (ordered). Unable to start an antiplatelet agent at this time due to subacute hemorrhagic stroke which is seen adjacent to the new area of ischemic infarction.   Electronically signed: Dr. Kerney Elbe

## 2019-01-18 NOTE — Progress Notes (Signed)
Informed pt's wife [name] that pt is transferring to 3W

## 2019-01-18 NOTE — Progress Notes (Signed)
SLP Cancellation Note  Patient Details Name: KIET GEER MRN: [id number] DOB: Dec 19, 1950   Cancelled treatment:       Reason Eval/Treat Not Completed: Patient at procedure or test/unavailable(Pt about to be evaluated by PT & OT. SLP will follow up.)  Jenin Birdsall I. Hardin Negus, [place], [place] Office number [phone] Pager [place] 01/18/2019, 9:34 AM

## 2019-01-18 NOTE — Progress Notes (Signed)
PT Cancellation Note  Patient Details Name: Andrew Cobb MRN: [id number] DOB: September 12, 1950   Cancelled Treatment:    Reason Eval/Treat Not Completed: Active bedrest order Will follow for appropriateness.   Marguarite Arbour A Jamar Weatherall 01/18/2019, 7:06 AM Wray Kearns, PT, DPT Acute Rehabilitation Services Pager [phone] Office [phone]

## 2019-01-18 NOTE — Progress Notes (Signed)
STROKE TEAM PROGRESS NOTE   INTERVAL HISTORY Pt sitting in chair. Still has partial expressive aphasia. Moving all extremities. MRI showed left MCA infarct with HT at previous location. ASA on hold. CTA head and neck pending.   Vitals:   01/18/19 0500 01/18/19 0600 01/18/19 0700 01/18/19 0800  BP: 113/73 135/88 139/73   Pulse: (!) 59 (!) 59 (!) 58   Resp: 14 17 13    Temp:    99.4 F (37.4 C)  TempSrc:    Oral  SpO2: 99% 99% 100%   Weight:        CBC:  Recent Labs  Lab 01/17/19 1505 01/17/19 1515  WBC 5.5  --   NEUTROABS 3.2  --   HGB 14.5 14.6  HCT 44.3 43.0  MCV 95.9  --   PLT 155  --     Basic Metabolic Panel:  Recent Labs  Lab 01/17/19 1505 01/17/19 1515  NA 136 136  K 4.4 4.2  CL 102 102  CO2 24  --   GLUCOSE 215* 208*  BUN 27* 29*  CREATININE 1.22 1.10  CALCIUM 9.3  --    Lipid Panel:     Component Value Date/Time   CHOL 113 01/17/2019 1547   CHOL 111 01/25/2018 0815   TRIG 202 (H) 01/17/2019 1547   TRIG 150 (H) 01/25/2018 0815   HDL 51 01/17/2019 1547   HDL 47 07/21/2017 1144   CHOLHDL 2.2 01/17/2019 1547   VLDL 40 01/17/2019 1547   VLDL 30 (H) 01/25/2018 0815   LDLCALC 22 01/17/2019 1547   LDLCALC 138 (H) 07/21/2017 1144   HgbA1c:  Lab Results  Component Value Date   HGBA1C 8.0 (H) 01/17/2019   Urine Drug Screen:     Component Value Date/Time   LABOPIA NONE DETECTED 09/19/2018 1529   COCAINSCRNUR NONE DETECTED 09/19/2018 1529   LABBENZ NONE DETECTED 09/19/2018 1529   AMPHETMU NONE DETECTED 09/19/2018 1529   THCU NONE DETECTED 09/19/2018 1529   LABBARB NONE DETECTED 09/19/2018 1529    Alcohol Level     Component Value Date/Time   ETH <10 01/17/2019 1505    IMAGING Mr Brain W Wo Contrast  Result Date: 01/18/2019 CLINICAL DATA:  68 y/o  M; intracranial hemorrhage for follow-up. EXAM: MRI HEAD WITHOUT AND WITH CONTRAST TECHNIQUE: Multiplanar, multiecho pulse sequences of the brain and surrounding structures were obtained without  and with intravenous contrast. CONTRAST:  9.3 cc Gadavist COMPARISON:  01/17/2019 CT head.  09/18/2018 MRI head. FINDINGS: Brain: Focus of reduced diffusion measuring 3.0 x 3.5 x 2.0 cm (volume = 11 cm^3) centered within the left temporoparietal junction compatible with acute/early subacute infarction. There is confluent petechial hemorrhage within the new area of acute/early subacute infarction (series 12, image 34). The acute/early subacute infarction is at the anterior margin of a now chronic left lateral parietal hemorrhagic infarction identified on the 3/14 MRI of the head. Stable chronic hemorrhagic infarcts within the left superior parietal lobe and left anterolateral frontal lobe. Increased susceptibility hypointensity within the left lateral parietal lobe chronic infarction consistent with findings of recent hemorrhage on CT. There is laminar necrosis with T1 shortening within the left parietal lobe chronic infarctions and mild non masslike enhancement which can be seen with stroke or hemorrhage. Large confluent nonspecific T2 FLAIR hyperintensities in subcortical and periventricular white matter are compatible with severe chronic microvascular ischemic changes. Mild volume loss of the brain. No hydrocephalus or herniation. Very small chronic infarct in the right lentiform nucleus. Vascular:  Normal flow voids. Skull and upper cervical spine: Normal marrow signal. Sinuses/Orbits: Negative. Other: None. IMPRESSION: 1. Acute/early subacute infarction, measuring up to 3.5 cm 11 cc, centered within the left temporoparietal junction. Associated confluent petechial hemorrhage without mass effect, [place] calssification 1b: HI2, confluent petechiae, no mass effect. 2. New hemorrhage within the the adjacent left lateral parietal lobe chronic infarction. 3. Stable small chronic hemorrhagic infarcts in the left superior parietal lobe and the left frontal lobe. 4. No additional findings of intracranial hemorrhage.  These results were called by telephone at the time of interpretation on 01/18/2019 at 12:51 am to Dr. Cheral Marker, who verbally acknowledged these results. Electronically Signed   By: Kristine Garbe M.D.   On: 01/18/2019 00:54   Chest Port 1 View  Result Date: 01/17/2019 CLINICAL DATA:  Code stroke.  Cough for 1 day. EXAM: PORTABLE CHEST 1 VIEW COMPARISON:  07/05/2015 FINDINGS: The cardiomediastinal silhouette is unremarkable. Minimal LEFT basilar scarring again noted. Monitor/recording device overlying the LEFT chest noted. There is no evidence of focal airspace disease, pulmonary edema, suspicious pulmonary nodule/mass, pleural effusion, or pneumothorax. No acute bony abnormalities are identified. RIGHT humeral surgical hardware again identified. IMPRESSION: No acute abnormality. Electronically Signed   By: Margarette Canada M.D.   On: 01/17/2019 16:00   Ct Head Code Stroke Wo Contrast  Result Date: 01/17/2019 CLINICAL DATA:  Code stroke. Difficulty with speech. History of stroke. EXAM: CT HEAD WITHOUT CONTRAST TECHNIQUE: Contiguous axial images were obtained from the base of the skull through the vertex without intravenous contrast. COMPARISON:  CT head 10/01/2018.  MRI head 09/18/2018 FINDINGS: Brain: Extension of hemorrhagic infarct in the left parietal lobe compared with the prior study. The hemorrhagic infarction is in the exact same location but is more prominent in size on today's study. No other areas of acute infarct Extensive chronic ischemic changes throughout the white matter. Chronic infarct left frontal lobe as noted previously. Mild atrophy without hydrocephalus. No mass or midline shift. Vascular: Negative for hyperdense vessel Skull: Negative Sinuses/Orbits: Mild mucosal edema in the ethmoid sinuses. Bilateral cataract surgery. Other: None ASPECTS ([place] Stroke Program Early CT Score) Aspect score not applicable. Hemorrhagic infarct on the left as above. IMPRESSION: 1. Acute hemorrhagic  infarct left posterior temporal and parietal lobe. This has progressed since the prior CT of 10/01/2018. Differential diagnosis includes recurrent emboli, intracranial stenosis, cerebral amyloid. 2. These results were called by telephone at the time of interpretation on 01/17/2019 at 3:26 pm to Dr. Rory Percy, who verbally acknowledged these results. Electronically Signed   By: Franchot Gallo M.D.   On: 01/17/2019 15:27    PHYSICAL EXAM  Temp:  [97.8 F (36.6 C)-99.4 F (37.4 C)] 97.9 F (36.6 C) (07/14 0845) Pulse Rate:  [54-68] 66 (07/14 1000) Resp:  [11-25] 19 (07/14 1000) BP: (88-157)/(61-104) 142/104 (07/14 1000) SpO2:  [92 %-100 %] 100 % (07/14 1000) Weight:  [92.5 kg] 92.5 kg (07/13 1542)  General - Well nourished, well developed, in no apparent distress.  Ophthalmologic - fundi not visualized due to noncooperation.  Cardiovascular - Regular rate and rhythm.  Mental Status -  Level of arousal and orientation to time, place, and person were intact, however, paraphasic errors with orientation questions. Language showed partial expressive aphasia, frequent word salad and paraphasic errors. Following all commands, able to name 2/4 and repeat simple sentence but complex sentences.   Cranial Nerves II - XII - II - Visual field intact OU. III, IV, VI - Extraocular movements intact. V -  Facial sensation intact bilaterally. VII - Facial movement intact bilaterally. VIII - Hearing & vestibular intact bilaterally. X - Palate elevates symmetrically. XI - Chin turning & shoulder shrug intact bilaterally. XII - Tongue protrusion intact.  Motor Strength - The patient's strength was normal in all extremities except pronator drift was present on the right.  Bulk was normal and fasciculations were absent.   Motor Tone - Muscle tone was assessed at the neck and appendages and was normal.  Reflexes - The patient's reflexes were symmetrical in all extremities and he had no pathological  reflexes.  Sensory - Light touch, temperature/pinprick were assessed and were symmetrical.    Coordination - The patient had normal movements in the left hand with no ataxia or dysmetria. However, mild right FTN dysmetria not out of proportion to weakness.  Tremor was absent.  Gait and Station - deferred.   ASSESSMENT/PLAN [name] is a 68 y.o. male with history of thrombocythemia, stroke, stent in coronary artery, hyperlipidemia, diabetes, CAD presenting with expressive and receptive aphasia.   Stroke:  acute L temporoparietal infarct with petechial hmg and new L lateral parietal HT into old left MCA infarct. Infarct likely embolic secondary to unknown source vs. Intracranial stenosis. Etiology of recurrent HT in the same area as before is unclear, ? HTN vs. ? Low LDL  Code Stroke CT head acute L posterior temporal and parietal ICH, progressed since 10/01/2018.  MRI  Acute/subacute infarct L temporoparietal jxn w/ petechial hemorrhage HI2. New L lateral parietal ICH in chronic infarct. Small vessel disease. Old L superior parietal and L frontal lobe infarcts.   CTA head & neck pending  Loop recorder placed 09/20/2018 - last interrogated 7/9 - false AF, pauses d/t undersending. Settings adjusted  2D Echo 09/2018 Unremarkable   LDL 22  UDS pending    HgbA1c 8.0  SCDs for VTE prophylaxis  No antithrombotic prior to admission, now on No antithrombotic.   Therapy recommendations:  Pending  Disposition:  pending   Hx stroke/TIA  09/18/2018 - Scattered acute Left MCA and Left MCA/PCA watershed infarcts - embolic - unknown etiology. CTA head and neck unremarkable. Placed on DAPT. Loop recorder placed. LDL 27 and A1C 7.8  10/01/2018 - admitted for HA, CT showed small HT at infarct site of left posterior MCA. DAPT on hold.   10/13/2018 - followed up at [facility] and ASA 81 restarted.   Hypertension  Home meds:  Benazepril 40  Now on benazepril to 20 in  hospital  Stable . SBP goal < 160  . Long-term BP goal normotensive  Hyperlipidemia  Home meds:  lipitor 40  LDL 22, at goal < 70  Hold statin given very low LDL in setting of HT  Diabetes type II Uncontrolled  Home meds:  Metformin 500 bid  HgbA1c 8.0, goal < 7.0  CBGs  SSI 0-15  Hold metformin due to CTA head and neck  Close PCP follow up  Other Stroke Risk Factors  Advanced age  Former Cigarette smoker   Family hx stroke (mother, father)  Coronary artery disease s/p DES  Ongoing HA/Migraines since initial stroke. Treated with elavil  Obstructive sleep apnea, on CPAP at home  Other Active Problems  Added Vit D replacement  Hospital day # 1  This patient is critically ill due to stroke, hemorrhagic conversion, HTN, DM and at significant risk of neurological worsening, death form recurrent stroke, hematoma expansion, cerebral edema, hypertensive emergency, seizure. This patient's care requires constant monitoring of  vital signs, hemodynamics, respiratory and cardiac monitoring, review of multiple databases, neurological assessment, discussion with family, other specialists and medical decision making of high complexity. I spent 35 minutes of neurocritical care time in the care of this patient. I had long discussion with Wife over the phone, updated pt current condition, treatment plan and potential prognosis. They expressed understanding and appreciation.    Rosalin Hawking, MD PhD Stroke Neurology 01/18/2019 10:38 AM     To contact Stroke Continuity provider, please refer to [email or web address] After hours, contact General Neurology

## 2019-01-18 NOTE — Evaluation (Signed)
Occupational Therapy Evaluation Patient Details Name: Andrew Cobb MRN: [id number] DOB: 29-Jan-1951 Today's Date: 01/18/2019    History of Present Illness 68 yo male admitted with aphasia and  MRI showed left MCA infarct with HT at previous location. PMH  h Arthritis, Asthma, Cancer Chronic venous insufficiency, Coronary artery disease (1996), Diabetes mellitus without complication, GERD (gastroesophageal reflux disease), Hyperlipidemia, Hypogonadism in male, MRSA (methicillin resistant Staphylococcus aureus) infection, Sleep apnea, Stented coronary artery, Stroke , and Thrombocythemia .    Clinical Impression   PT admitted with L MCA with aphaisa. Pt currently with functional limitiations due to the deficits listed below (see OT problem list). Pt currently with balance deficits and decr activity tolerance.  Pt will benefit from skilled OT to increase their independence and safety with adls and balance to allow discharge outpatient.     Follow Up Recommendations  Outpatient OT(balance)    Equipment Recommendations  None recommended by OT    Recommendations for Other Services       Precautions / Restrictions Precautions Precautions: Fall Restrictions Weight Bearing Restrictions: No      Mobility Bed Mobility               General bed mobility comments: in chair on arrival  Transfers Overall transfer level: Needs assistance   Transfers: Sit to/from Stand Sit to Stand: Min guard         General transfer comment: using bil UE to push up from chair    Balance Overall balance assessment: Mild deficits observed, not formally tested                                         ADL either performed or assessed with clinical judgement   ADL Overall ADL's : Needs assistance/impaired Eating/Feeding: Independent Eating/Feeding Details (indicate cue type and reason): sitting and eating eggs and muffin Grooming: Wash/dry hands;Standing;Min guard Grooming  Details (indicate cue type and reason): able to locate all items     Lower Body Bathing: Min guard   Upper Body Dressing : Min guard   Lower Body Dressing: Min guard   Toilet Transfer: Min guard           Functional mobility during ADLs: Min guard General ADL Comments: pt walking guarded and reports feeling a little weaker. pt also reports not eating for extended amount of time     Vision Baseline Vision/History: Wears glasses Wears Glasses: At all times Additional Comments: no deficits noted on assessment     Perception     Praxis      Pertinent Vitals/Pain Pain Assessment: No/denies pain     Hand Dominance Left   Extremity/Trunk Assessment Upper Extremity Assessment Upper Extremity Assessment: Overall WFL for tasks assessed   Lower Extremity Assessment Lower Extremity Assessment: Defer to PT evaluation   Cervical / Trunk Assessment Cervical / Trunk Assessment: Other exceptions(hx of cervical hx)   Communication Communication Communication: Expressive difficulties;HOH   Cognition Arousal/Alertness: Awake/alert Behavior During Therapy: WFL for tasks assessed/performed Overall Cognitive Status: Within Functional Limits for tasks assessed                                 General Comments: pt able to answer all questions appropriately with increase time and able to recall wife phone number accurately from memory.    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         OT Problem List: Decreased activity tolerance      OT Treatment/Interventions: Self-care/ADL training;Therapeutic exercise;Therapeutic activities;Energy conservation;Balance training;Patient/family education    OT Goals(Current goals can be found in the care plan section) Acute Rehab OT Goals Patient Stated Goal: to return to work as [employer]ie [employer]ier this Fall OT Goal Formulation: With patient Time For Goal Achievement: 02/01/19 Potential to Achieve Goals: Good  OT Frequency: Min 3X/week   Barriers to D/C:            Co-evaluation              AM-PAC OT "6 Clicks" Daily Activity     Outcome Measure Help from another person eating meals?: None Help from another person taking care of personal grooming?: None Help from another person toileting, which includes using toliet, bedpan, or urinal?: A Little Help from another person bathing (including washing, rinsing, drying)?: A Little Help from another person to put on and taking off regular upper body clothing?: None Help from another person to put on and taking off regular lower body clothing?: A Little 6 Click Score: 21   End of Session Equipment Utilized During Treatment: Gait belt Nurse Communication: Mobility status;Precautions  Activity Tolerance: Patient tolerated treatment well Patient left: in chair;with call bell/phone within reach;with nursing/sitter in room  OT Visit Diagnosis: Unsteadiness on feet (R26.81)                Time: 3007-6226 OT Time Calculation (min): 23 min Charges:  OT General Charges $OT Visit: 1 Visit OT Evaluation $OT Eval Moderate Complexity: 1 Mod   Jeri Modena, OTR/L  Acute Rehabilitation Services Pager:  [phone] Office: [phone] .   Jeri Modena 01/18/2019, 10:53 AM

## 2019-01-18 NOTE — Consult Note (Addendum)
   [facility] CM Inpatient Consult   01/18/2019  Andrew Cobb Jan 31, 1951 [id number]    Patient was brought by EMS as a code stroke secondary to patient showing expressive and receptive aphasia. MRI of brain showed area of acute/subacute ischemic stroke adjacent to the known left parieto-temporal subacute hemorrhagic stroke.  Primary care provider is Dr. Golden Pop with [facility] (an embedded practice).   Patient will be followed by an external care management group ([facility]) post hospitalization to follow-up needs and for continued case management services with [facility] [employer].    Will sign off.     For additional questions or referrals, please contact:   Edwena Felty A. Kebin Maye, BSN, RN-BC [facility] Liaison Cell: [phone]

## 2019-01-18 NOTE — Progress Notes (Signed)
OT Cancellation Note  Patient Details Name: SHEY BARTMESS MRN: [id number] DOB: 1951/02/13   Cancelled Treatment:    Reason Eval/Treat Not Completed: Patient not medically ready;Active bedrest order  Richelle Ito, OTR/L  Acute Rehabilitation Services Pager: [phone] Office: [phone] .  01/18/2019, 7:30 AM

## 2019-01-18 NOTE — Progress Notes (Signed)
  Speech Language Pathology Treatment: Cognitive-Linquistic(Aphasia )  Patient Details Name: Andrew Cobb MRN: [id number] DOB: 09/18/1950 Today's Date: 01/18/2019 Time: 3567-0141 SLP Time Calculation (min) (ACUTE ONLY): 24 min  Assessment / Plan / Recommendation Clinical Impression  Pt was seen for aphasia treatment and was cooperative during the session. He expressed that he enjoys singing at church and he was able to sing part of a gospel song with some paraphasias, perseveration, and unintentional structure modification. He achieved 90% accuracy with confrontational naming increasing to 100% with orthographic cues. He demonstrated 50% accuracy with sentence completion increasing to 90% with phonemic cues. With structured sentence formulation he achieved 40% accuracy increasing to 100% with mod cues. He answered complex yes/no questions with 70% accuracy increasing to 100% with re-phrasing. SLP will continue to follow pt.     HPI HPI: Pt is a 68 y.o. male with history of thrombocythemia, stroke, stent in coronary artery, hyperlipidemia, diabetes, CAD who presented to the ED with aphasic. MRI of the brain revealed acute/early subacute infarction, measuring up to 3.5 cm 11 cc centered within the left temporoparietal junction. New hemorrhage within the the adjacent left lateral parietal lobe chronic infarction.      SLP Plan  Continue with current plan of care  Patient needs continued Speech Lanaguage Pathology Services    Recommendations                   Follow up Recommendations: Outpatient SLP;24 hour supervision/assistance SLP Visit Diagnosis: Aphasia (R47.01) Plan: Continue with current plan of care       Tijana Walder I. Hardin Negus, [place], [place] Office number [phone] Pager [place] 01/18/2019, 3:29 PM

## 2019-01-18 NOTE — Evaluation (Signed)
Physical Therapy Evaluation Patient Details Name: Andrew Cobb MRN: [id number] DOB: April 03, 1951 Today's Date: 01/18/2019   History of Present Illness  68 yo male admitted with aphasia. MRI showed left MCA infarct with HT at previous location. NIH:7. KDT:OIZTIWPYK, Asthma, Cancer Chronic venous insufficiency, CAD (1996), DM, HTN, Hypogonadism, MRSA infection, Sleep apnea, Stented coronary artery, Stroke and Thrombocythemia.  Clinical Impression  Patient presents with speech difficulties and impaired balance s/p above. 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 Lives with wife. Today, pt tolerated transfers and gait training with Min guard -Min A for balance/safety. Difficult to fully assess cognition due to language deficits but pt very aware of this. Speech seemed better during automatic things- singing familiar song, reciting ABCs etc. Would benefit from higher level balance activities next session. Will follow acutely to maximize independence and mobility prior to return home.    Follow Up Recommendations Outpatient PT;Supervision - Intermittent    Equipment Recommendations  None recommended by PT    Recommendations for Other Services       Precautions / Restrictions Precautions Precautions: Fall Restrictions Weight Bearing Restrictions: No      Mobility  Bed Mobility               General bed mobility comments: in chair on arrival  Transfers Overall transfer level: Needs assistance Equipment used: None Transfers: Sit to/from Stand Sit to Stand: Min guard         General transfer comment: Min guard for safety. Stood from Youth worker.  Ambulation/Gait Ambulation/Gait assistance: Min guard;Min assist Gait Distance (Feet): 300 Feet Assistive device: None Gait Pattern/deviations: Step-through pattern;Decreased stride length;Drifts right/left Gait velocity: decreased   General Gait Details: Slow, mildly unsteady and guarded  gait with decreased arm swing bilaterally. Moments of Min A but mostly MIn guard for safety. VSS throughout.  Stairs            Wheelchair Mobility    Modified [name] (Stroke Patients Only) Modified [name] (Stroke Patients Only) Pre-Morbid [name] Score: No symptoms Modified [name]: Moderately severe disability     Balance Overall balance assessment: Mild deficits observed, not formally tested                                           Pertinent Vitals/Pain Pain Assessment: No/denies pain    Home Living Family/patient expects to be discharged to:: Private residence Living Arrangements: Spouse/significant other Available Help at Discharge: Family;Available 24 hours/day Type of Home: House Home Access: Stairs to enter   [employer] of Steps: 1 Home Layout: One level Home Equipment: Walker - 2 wheels;Shower seat Additional Comments: 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 Loves to sing at [place]   Dominant Hand: Left    Extremity/Trunk Assessment   Upper Extremity Assessment Upper Extremity Assessment: Defer to OT evaluation    Lower Extremity Assessment Lower Extremity Assessment: Overall WFL for tasks assessed(Grossly ~4/5 throughout, no evidence of instability with walking)    Cervical / Trunk Assessment Cervical / Trunk Assessment: Other exceptions Cervical / Trunk Exceptions: hx  of neck surgery  Communication   Communication: Expressive difficulties;HOH  Cognition Arousal/Alertness: Awake/alert Behavior During Therapy: WFL for tasks assessed/performed Overall Cognitive Status: Within Functional Limits for tasks assessed                                  General Comments: pt able to answer all questions appropriately with increase time and able to recall wife phone number accurately from memory.       General Comments General comments (skin integrity, edema, etc.): Noted to do better with speech for automatic things- singing familiar songs, singing abcs etc.    Exercises     Assessment/Plan    PT Assessment Patient needs continued PT services  PT Problem List Decreased balance;Decreased cognition;Decreased mobility       PT Treatment Interventions Therapeutic activities;Cognitive remediation;Gait training;Stair training;Therapeutic exercise;Functional mobility training;Balance training;Neuromuscular re-education    PT Goals (Current goals can be found in the Care Plan section)  Acute Rehab PT Goals Patient Stated Goal: to return to work as [employer] this Fall PT Goal Formulation: With patient Time For Goal Achievement: 02/01/19 Potential to Achieve Goals: Good    Frequency Min 4X/week   Barriers to discharge        Co-evaluation               AM-PAC PT "6 Clicks" Mobility  Outcome Measure Help needed turning from your back to your side while in a flat bed without using bedrails?: None Help needed moving from lying on your back to sitting on the side of a flat bed without using bedrails?: None Help needed moving to and from a bed to a chair (including a wheelchair)?: A Little Help needed standing up from a chair using your arms (e.g., wheelchair or bedside chair)?: A Little Help needed to walk in hospital room?: A Little Help needed climbing 3-5 steps with a railing? : A Little 6 Click Score: 20    End of Session Equipment Utilized During Treatment: Gait belt Activity Tolerance: Patient tolerated treatment well Patient left: in chair;with call bell/phone within reach Nurse Communication: Mobility status PT Visit Diagnosis: Unsteadiness on feet (R26.81)    Time: 9622-2979 PT Time Calculation (min) (ACUTE ONLY):  23 min   Charges:   PT Evaluation $PT Eval Moderate Complexity: 1 Mod          Wray Kearns, PT, DPT Acute Rehabilitation Services Pager [phone] Office [phone]      Andrew Cobb 01/18/2019, 11:22 AM

## 2019-01-18 NOTE — Evaluation (Signed)
\Speech Language Pathology Evaluation Patient Details Name: Andrew Cobb MRN: [id number] DOB: July 09, 1950 Today's Date: 01/18/2019 Time: 0737-1062 SLP Time Calculation (min) (ACUTE ONLY): 25 min  Problem List:  Patient Active Problem List   Diagnosis Date Noted  . ICH (intracerebral hemorrhage) ([place]) 01/17/2019  . CVA (cerebral vascular accident) ([place]) 09/18/2018  . Coronary artery disease involving native coronary artery of native heart without angina pectoris   . H/O right coronary artery stent placement   . Allergic rhinitis 08/19/2018  . Cervical radiculopathy 08/17/2017  . Advanced care planning/counseling discussion 07/21/2017  . Skin lesion of right arm 12/16/2016  . Prostate cancer ([place]) 12/08/2016  . H/O bariatric surgery 07/17/2016  . Essential hypertension 04/24/2015  . Diabetes mellitus associated with hormonal etiology ([place]) 04/24/2015  . Chronic sinusitis 08/13/2009  . Hyperlipidemia 04/02/2009  . Coronary atherosclerosis 04/02/2009  . 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 OSA (obstructive sleep apnea) 04/02/2009  . Arthritis of shoulder region, degenerative 04/02/2009   Past Medical History:  Past Medical History:  Diagnosis Date  . Arthritis   . Asthma   . Cancer [facility])    prostate  . Chronic venous insufficiency    varicose vein lower extremity with inflammation  . Coronary artery disease 1996   two stents placed   . Diabetes mellitus without complication ([place])    type 2 on metformin  . GERD (gastroesophageal reflux disease)    no issues since gastric bypass surgery as stated per pt  . Hyperlipidemia   . Hypogonadism in male   . MRSA (methicillin resistant Staphylococcus aureus) infection    07/30/2008 thru 08/07/2008  . Sleep apnea    on BIPAP  . Stented coronary artery   . Stroke ([place])   . Thrombocythemia [facility])    Past Surgical History:  Past Surgical History:  Procedure Laterality Date  . ANGIOPLASTY    . ANGIOPLASTY     with  stent 04/07/1995  . ANTERIOR CERVICAL DECOMPRESSION/DISCECTOMY FUSION 4 LEVELS N/A 08/17/2017   Procedure: Anterior discectomy with fusion and plate fixation Cervical Three-Four, Four-Five, Five-Six, and Six-Seven Fusion;  Surgeon: Ditty, Kevan Ny, MD;  Location: [place];  Service: Neurosurgery;  Laterality: N/A;  Anterior discectomy with fusion and plate fixation Cervical Three-Four, Four-Five, Five-Six, and Six-Seven Fusion   . APPENDECTOMY     1966  . BUBBLE STUDY  09/20/2018   Procedure: BUBBLE STUDY;  Surgeon: Josue Hector, MD;  Location: [place];  Service: Cardiovascular;;  . [place]  . CARDIOVASCULAR STRESS TEST     07/31/2011  . CARPAL TUNNEL RELEASE Left   . CHOLECYSTECTOMY     2006  . colonscopy      08/25/2012  . EYE SURGERY Bilateral    cataract  . FUNCTIONAL ENDOSCOPIC SINUS SURGERY     11/10/2013  . GASTRIC BYPASS     10/05/2012  . HERNIA REPAIR     left inguinal 1981  . INCISION AND DRAINAGE ABSCESS Right 02/26/2017   Procedure: INCISION AND DRAINAGE ABSCESS;  Surgeon: Nickie Retort, MD;  Location: [facility] ORS;  Service: Urology;  Laterality: Right;  . JOINT REPLACEMENT     bilateral  . left ankle surgery      05/03/2003   . left carpel tunnel      09/18/1993  . left knee meniscal tear      01/25/2010  . left knee meniscal tear repair      05/04/1996  . left rotator cuff repair  05/03/2003   . LOOP RECORDER INSERTION N/A 09/20/2018   Procedure: LOOP RECORDER INSERTION;  Surgeon: Evans Lance, MD;  Location: [place] CV LAB;  Service: Cardiovascular;  Laterality: N/A;  . REPLACEMENT TOTAL KNEE BILATERAL  07/13/2015  . right ankle surgery      fracture has 2 screws 07/07/1997  . right carpel tunnel      05/16/1992  . right shoulder replacement      01/27/2006  . SCROTAL EXPLORATION Right 02/26/2017   Procedure: SCROTUM EXPLORATION;  Surgeon: Nickie Retort, MD;  Location: [facility];  Service: Urology;  Laterality: Right;   . TEE WITHOUT CARDIOVERSION N/A 09/20/2018   Procedure: TRANSESOPHAGEAL ECHOCARDIOGRAM (TEE);  Surgeon: Josue Hector, MD;  Location: [facility] ENDOSCOPY;  Service: Cardiovascular;  Laterality: N/A;  . TOTAL KNEE ARTHROPLASTY Left 07/13/2015   Procedure: LEFT TOTAL KNEE ARTHROPLASTY;  Surgeon: Gaynelle Arabian, MD;  Location: [facility];  Service: Orthopedics;  Laterality: Left;   HPI:  Pt is a 68 y.o. male with history of thrombocythemia, stroke, stent in coronary artery, hyperlipidemia, diabetes, CAD who presented to the ED with aphasic. MRI of the brain revealed acute/early subacute infarction, measuring up to 3.5 cm 11 cc centered within the left temporoparietal junction. New hemorrhage within the the adjacent left lateral parietal lobe chronic infarction.   Assessment / Plan / Recommendation Clinical Impression  Pt reported that he was working and living independently prior to admission. 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 He denied any baseline deficits in speech, language or cognition. Pt presents with moderate expressive aphasia characterized by impairments in receptive and expressive language with more notable difficulty with verbal expression. Receptively, he was able to follow two-step commands and answer simple yes/no questions but exhibited difficulty with complex questions and consistently following 3-step commands. Pt was able to communicate at the phrase level but word retrieval difficulty was noted. Paraphasic errors were observed during structured naming tasks and during spontaneous speech. No motor speech deficits were noted during the evaluation and assessment of cognition was deferred due to pt's language deficits. Skilled SLP services are clinically indicated at this time to improve aphasia.     SLP Assessment  SLP Recommendation/Assessment: Patient needs continued Speech Lanaguage Pathology Services SLP Visit Diagnosis: Aphasia  (R47.01)    Follow Up Recommendations  Outpatient SLP;24 hour supervision/assistance    Frequency and Duration min 2x/week  2 weeks      SLP Evaluation Cognition  Overall Cognitive Status: Difficult to assess(Due to aphasia ) Orientation Level: Oriented X4       Comprehension  Auditory Comprehension Overall Auditory Comprehension: Impaired Yes/No Questions: Impaired Basic Immediate Environment Questions: (5/5) Complex Questions: (3/5) Paragraph Comprehension (via yes/no questions): Not tested Commands: Impaired Two Step Basic Commands: (4/4) Multistep Basic Commands: (1/4) Conversation: Simple Visual Recognition/Discrimination Discrimination: Within Function Limits Reading Comprehension Reading Status: Impaired Word level: Within functional limits Sentence Level: Within functional limits Paragraph Level: Impaired Functional Environmental (signs, name badge): Within functional limits    Expression Expression Primary Mode of Expression: Verbal Verbal Expression Overall Verbal Expression: Impaired Initiation: Impaired Automatic Speech: Counting;Day of week;Month of year(WNL) Level of Generative/Spontaneous Verbalization: Sentence Repetition: Impaired Level of Impairment: Sentence level Naming: Impairment Responsive: (2/5) Confrontation: Impaired(5/10) Convergent: (Sentence completion: 3/5) Divergent: Not tested Verbal Errors: Semantic paraphasias;Phonemic paraphasias;Neologisms Written Expression Dominant Hand: Left   Oral / Motor  Oral Motor/Sensory Function Overall Oral Motor/Sensory Function: Within functional limits Motor  Speech Overall Motor Speech: Appears within functional limits for tasks assessed Respiration: Within functional limits Phonation: Normal Resonance: Within functional limits Articulation: Within functional limitis Intelligibility: Intelligible Motor Planning: Witnin functional limits Motor Speech Errors: Not applicable   Zalaya Astarita I.  Hardin Negus, [place], [place] Office number [phone] Pager [place] 01/18/2019, 3:17 PM

## 2019-01-19 ENCOUNTER — Ambulatory Visit: Payer: PPO | Admitting: Adult Health

## 2019-01-19 DIAGNOSIS — E119 Type 2 diabetes mellitus without complications: Secondary | ICD-10-CM

## 2019-01-19 LAB — BASIC METABOLIC PANEL
Anion gap: 11 (ref 5–15)
BUN: 16 mg/dL (ref 8–23)
CO2: 21 mmol/L — ABNORMAL LOW (ref 22–32)
Calcium: 9.1 mg/dL (ref 8.9–10.3)
Chloride: 104 mmol/L (ref 98–111)
Creatinine, Ser: 1.14 mg/dL (ref 0.61–1.24)
GFR calc Af Amer: >60 mL/min (ref >60)
GFR calc non Af Amer: >60 mL/min (ref >60)
Glucose, Bld: 152 mg/dL — ABNORMAL HIGH (ref 70–99)
Potassium: 4.3 mmol/L (ref 3.5–5.1)
Sodium: 136 mmol/L (ref 135–145)

## 2019-01-19 LAB — CBC
HCT: 46.6 % (ref 39.0–52.0)
Hemoglobin: 15.3 g/dL (ref 13.0–17.0)
MCH: 31.3 pg (ref 26.0–34.0)
MCHC: 32.8 g/dL (ref 30.0–36.0)
MCV: 95.3 fL (ref 80.0–100.0)
Platelets: 147 10*3/uL — ABNORMAL LOW (ref 150–400)
RBC: 4.89 MIL/uL (ref 4.22–5.81)
RDW: 12.3 % (ref 11.5–15.5)
WBC: 8.2 10*3/uL (ref 4.0–10.5)
nRBC: 0 % (ref 0.0–0.2)

## 2019-01-19 LAB — GLUCOSE, CAPILLARY
Glucose-Capillary: 134 mg/dL — ABNORMAL HIGH (ref 70–99)
Glucose-Capillary: 139 mg/dL — ABNORMAL HIGH (ref 70–99)

## 2019-01-19 MED ORDER — BENAZEPRIL HCL 40 MG PO TABS: 20.0000 mg | ORAL_TABLET | Freq: Every day | ORAL | 3 refills | Status: DC

## 2019-01-19 MED ORDER — METFORMIN HCL 500 MG PO TABS: 500.0000 mg | ORAL_TABLET | Freq: Two times a day (BID) | ORAL | 2 refills | Status: DC

## 2019-01-19 NOTE — Progress Notes (Signed)
Inpatient Diabetes Program Recommendations  AACE/ADA: New Consensus Statement on Inpatient Glycemic Control (2015)  Target Ranges:  Prepandial:   less than 140 mg/dL      Peak postprandial:   less than 180 mg/dL (1-2 hours)      Critically ill patients:  140 - 180 mg/dL   Results for Andrew Cobb, Andrew Cobb (MRN [id number]) as of 01/19/2019 08:35  Ref. Range 01/18/2019 08:17 01/18/2019 12:20 01/18/2019 17:48 01/18/2019 21:51  Glucose-Capillary Latest Ref Range: 70 - 99 mg/dL 135 (H)  2 units NOVOLOG  209 (H)  5 units NOVOLOG  264 (H)  8 units NOVOLOG  108 (H)     Home DM Meds: Metformin 500 mg BID  Current Orders: Novolog Moderate Correction Scale/ SSI (0-15 units) TID AC + HS      MD- Note that patient having glucose elevations after meals.  Home dose Metformin on hold.  Please consider adding Novolog Meal Coverage to current inpatient insulin regimen:  Recommend Novolog 4 units TID with meals  (Please add the following Hold Parameters: Hold if pt eats <50% of meal, Hold if pt NPO)     --Will follow patient during hospitalization--  Wyn Quaker RN, MSN, CDE Diabetes Coordinator Inpatient Glycemic Control Team Team Pager: [phone] (8a-5p)

## 2019-01-19 NOTE — Progress Notes (Signed)
  Speech Language Pathology Treatment: Cognitive-Linquistic(Aphasia )  Patient Details Name: Andrew Cobb MRN: [id number] DOB: [birth year] Today's Date: 01/19/2019 Time: 3754-3606 SLP Time Calculation (min) (ACUTE ONLY): 23 min  Assessment / Plan / Recommendation Clinical Impression  Pt was seen for aphasia treatment and was cooperative during the session. His verbal fluency was improved compared to yesterday and he required min-mod support during conversation. He achieved 100% accuracy with 2-step commands. He demonstrated 80% accuracy with sentence completion increasing to 100% with phonemic cues. He completed responsive naming with 60% accuracy increasing to 100% with min. cues. With structured sentence formulation he achieved 60% accuracy increasing to 100% with min. cues. [name] Kitchen SLP will continue to follow pt.    HPI HPI: Pt is a 68 y.o. male with history of thrombocythemia, stroke, stent in coronary artery, hyperlipidemia, diabetes, CAD who presented to the ED with aphasic. MRI of the brain revealed acute/early subacute infarction, measuring up to 3.5 cm 11 cc centered within the left temporoparietal junction. New hemorrhage within the the adjacent left lateral parietal lobe chronic infarction.      SLP Plan  Continue with current plan of care       Recommendations                   Follow up Recommendations: Outpatient SLP;24 hour supervision/assistance SLP Visit Diagnosis: Aphasia (R47.01) Plan: Continue with current plan of care       Legaci Tarman I. Hardin Negus, [place], [place] Office number [phone] Pager [place] 01/19/2019, 12:05 PM

## 2019-01-19 NOTE — Discharge Summary (Addendum)
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 10/13/2018 - followed up at [facility] and ASA 81 restarted.   Hypertension  Home meds:  Benazepril 40  Decreased benazepril to 20 in hospital d/t lower BPs  BP Stable on decreased dose  BP goal normotensive  Hyperlipidemia  Home meds:  lipitor 40  LDL 22, at goal < 70  Hold statin given very low LDL in setting of HT  Diabetes type II Uncontrolled  Home meds:  Metformin 500 bid  HgbA1c 8.0, goal < 7.0  Close PCP follow up  Resume metformin on discharge  Other Stroke Risk Factors  Advanced age  Former Cigarette smoker   Family hx stroke (mother, father)  Coronary artery disease s/p DES  Ongoing HA/Migraines since initial stroke. Treated with elavil  Obstructive sleep apnea, on CPAP at home  Other Active Problems  Added Vit D replacement   DISCHARGE EXAM Blood pressure 127/89, pulse (!) 58, temperature 97.8 F (36.6 C), temperature source Oral, resp. rate 18, weight 92.5 kg, SpO2 99 %. General - Well nourished, well developed, in no apparent  distress.  Ophthalmologic - fundi not visualized due to noncooperation.  Cardiovascular - Regular rate and rhythm.  Mental Status -  Level of arousal and orientation to time, place, and person were intact, however, paraphasic errors with orientation questions. Language showed occasional word salad and paraphasic errors. Following all commands, able to name 2/4 and repeat simple sentence but complex sentences.   Cranial Nerves II - XII - II - Visual field intact OU. III, IV, VI - Extraocular movements intact. V - Facial sensation intact bilaterally. VII - Facial movement intact bilaterally. VIII - Hearing & vestibular intact bilaterally. X - Palate elevates symmetrically. XI - Chin turning & shoulder shrug intact bilaterally. XII - Tongue protrusion intact.  Motor Strength - The patient's strength was normal in all extremities except pronator drift was present on the right.  Bulk was normal and fasciculations were absent.   Motor Tone - Muscle tone was assessed at the neck and appendages and was normal.  Reflexes - The patient's reflexes were symmetrical in all extremities and he had no pathological reflexes.  Sensory - Light touch, temperature/pinprick were assessed and were symmetrical.    Coordination - The patient had normal movements in the left hand with no ataxia or dysmetria. However, mild right FTN dysmetria not out of proportion to weakness.  Tremor was absent.  Gait and Station - deferre  Discharge Diet   Heart healthy/carb modified thin liquids  DISCHARGE PLAN  Disposition:  Return home  No antithrombotic for secondary stroke prevention given hemorrhage. Follow up in 3-4 weeks to evaluate for resuming  Ongoing stroke risk factor control by Primary Care Physician at time of discharge  Follow-up Guadalupe Maple, MD in 2 weeks.  Follow-up in [facility] with Dr. Leonie Man in 3-4 weeks to make a decision about resuming  antithrombotic, office to schedule an appointment.   45 minutes were spent preparing discharge.  Rosalin Hawking, MD PhD Stroke Neurology 01/19/2019 6:53 PM

## 2019-01-19 NOTE — Progress Notes (Signed)
Patient has home unit CPAP at bedside. Will use once ready for bed

## 2019-01-19 NOTE — Plan of Care (Signed)
  Problem: Education: Goal: Knowledge of disease or condition will improve Outcome: Completed/Met   Problem: Education: Goal: Knowledge of disease or condition will improve Outcome: Completed/Met Goal: Knowledge of secondary prevention will improve Outcome: Completed/Met Goal: Knowledge of patient specific risk factors addressed and post discharge goals established will improve Outcome: Completed/Met Goal: Individualized Educational Video(s) Outcome: Completed/Met   Problem: Coping: Goal: Will verbalize positive feelings about self 01/19/2019 1434 by Clint Bolder, RN Outcome: Completed/Met 01/19/2019 1317 by Clint Bolder, RN Outcome: Progressing Goal: Will identify appropriate support needs Outcome: Completed/Met   Problem: Self-Care: Goal: Ability to communicate needs accurately will improve Outcome: Completed/Met   Problem: Nutrition: Goal: Risk of aspiration will decrease Outcome: Completed/Met   Problem: Intracerebral Hemorrhage Tissue Perfusion: Goal: Complications of Intracerebral Hemorrhage will be minimized Outcome: Completed/Met   Problem: Acute Rehab OT Goals (only OT should resolve) Goal: OT Additional ADL Goal #1 Outcome: Completed/Met Goal: OT Additional ADL Goal #2 Outcome: Completed/Met Goal: OT Additional ADL Goal #3 Outcome: Completed/Met   Problem: Acute Rehab PT Goals(only PT should resolve) Goal: Pt Will Go Supine/Side To Sit Outcome: Completed/Met Goal: Pt Will Go Sit To Supine/Side Outcome: Completed/Met Goal: Patient Will Transfer Sit To/From Stand Outcome: Completed/Met Goal: Pt Will Ambulate Outcome: Completed/Met Goal: PT Additional Goal #1 Outcome: Completed/Met   Problem: SLP Language Goals Goal: Patient will communicate needs/wants with Outcome: Completed/Met Goal: Patient will follow commands during a functional ADL with Outcome: Completed/Met Goal: Misc Speech Goal #1 Outcome: Completed/Met Goal: Misc Speech Goal  #2 Outcome: Completed/Met

## 2019-01-19 NOTE — TOC Transition Note (Signed)
Transition of Care [facility]) - CM/SW Discharge Note   Patient Details  Name: Andrew Cobb MRN: [id number] Date of Birth: 08-02-1950  [facility] CM/SW Contact:  Pollie Friar, RN Phone Number: 01/19/2019, 3:32 PM   Clinical Narrative:    Rollator delivered to the room. Outpatient set up and will call the patient for the first appt. Pts wife to provide transport home.    Final next level of care: OP Rehab Barriers to Discharge: No Barriers Identified   Patient Goals and CMS Choice        Discharge Placement                       Discharge Plan and Services                DME Arranged: Walker rolling with seat DME Agency: [employer] Date DME Agency Contacted: 01/19/19   Representative spoke with at DME Agency: Zack            Social Determinants of Health ([place]) Interventions     Readmission Risk Interventions No flowsheet data found.

## 2019-01-19 NOTE — Plan of Care (Signed)
  Problem: Coping: Goal: Will verbalize positive feelings about self Outcome: Progressing

## 2019-01-19 NOTE — Progress Notes (Signed)
Physical Therapy Treatment Patient Details Name: Andrew Cobb MRN: [id number] DOB: 1950-09-03 Today's Date: 01/19/2019    History of Present Illness 68 yo male admitted with aphasia. MRI showed left MCA infarct with HT at previous location. NIH:7. [id number], Asthma, Cancer Chronic venous insufficiency, CAD (1996), DM, HTN, Hypogonadism, MRSA infection, Sleep apnea, Stented coronary artery, Stroke and Thrombocythemia.    PT Comments    Patient seen for mobility progression. Overall pt requires supervision for mobility this session. Pt with DGI score of 20/24 and without LOB when with high level balance activities. Continue to progress as tolerated.   Follow Up Recommendations  Outpatient PT;Supervision - Intermittent     Equipment Recommendations  None recommended by PT    Recommendations for Other Services       Precautions / Restrictions Precautions Precautions: Fall    Mobility  Bed Mobility               General bed mobility comments: in chair on arrival  Transfers Overall transfer level: Needs assistance Equipment used: None Transfers: Sit to/from Stand Sit to Stand: Supervision         General transfer comment: supervision for safety  Ambulation/Gait Ambulation/Gait assistance: Supervision Gait Distance (Feet): 300 Feet Assistive device: None Gait Pattern/deviations: Step-through pattern;Decreased stride length Gait velocity: decreased   General Gait Details: close supervision for safety; cues for increased cadence; no LOB or physical assist required    Stairs             Wheelchair Mobility    Modified [name] (Stroke Patients Only) Modified [name] (Stroke Patients Only) Pre-Morbid [name] Score: No symptoms Modified [name]: Moderately severe disability     Balance                                 Standardized Balance Assessment Standardized Balance Assessment : Dynamic Gait Index   Dynamic Gait Index Level  Surface: Normal Change in Gait Speed: Mild Impairment Gait with Horizontal Head Turns: Normal Gait with Vertical Head Turns: Mild Impairment Gait and Pivot Turn: Normal Step Over Obstacle: Normal Step Around Obstacles: Mild Impairment Steps: Mild Impairment Total Score: 20      Cognition Arousal/Alertness: Awake/alert Behavior During Therapy: WFL for tasks assessed/performed Overall Cognitive Status: Within Functional Limits for tasks assessed                                 General Comments: expressive aphasia      Exercises      General Comments        Pertinent Vitals/Pain Pain Assessment: No/denies pain    Home Living                      Prior Function            PT Goals (current goals can now be found in the care plan section) Acute Rehab PT Goals Patient Stated Goal: to return to work as [name] this Fall Progress towards PT goals: Progressing toward goals    Frequency    Min 4X/week      PT Plan Current plan remains appropriate    Co-evaluation              AM-PAC PT "6 Clicks" Mobility   Outcome Measure  Help needed turning from your back to your side while in a flat  bed without using bedrails?: None Help needed moving from lying on your back to sitting on the side of a flat bed without using bedrails?: None Help needed moving to and from a bed to a chair (including a wheelchair)?: None Help needed standing up from a chair using your arms (e.g., wheelchair or bedside chair)?: None Help needed to walk in hospital room?: None Help needed climbing 3-5 steps with a railing? : A Little 6 Click Score: 23    End of Session Equipment Utilized During Treatment: Gait belt Activity Tolerance: Patient tolerated treatment well Patient left: in chair;with call bell/phone within reach;with chair alarm set Nurse Communication: Mobility status PT Visit Diagnosis: Unsteadiness on feet (R26.81)     Time: 7737-3668 PT Time  Calculation (min) (ACUTE ONLY): 19 min  Charges:  $Gait Training: 8-22 mins                     Earney Navy, PTA Acute Rehabilitation Services Pager: [phone] Office: [phone]     Darliss Cheney 01/19/2019, 11:51 AM

## 2019-01-20 ENCOUNTER — Telehealth: Payer: Self-pay

## 2019-01-20 NOTE — Telephone Encounter (Signed)
Transition Care Management Follow-up Telephone Call  Date of discharge and from where[facility]  How have you been since you were released from the hospital? "having some difficulty with speech, know what I want to say but cant get it out."  Any questions or concerns? No   Items Reviewed:  Did the pt receive and understand the discharge instructions provided? Yes   Medications obtained and verified? Yes   Any new allergies since your discharge? No   Dietary orders reviewed? Yes  Do you have support at home? Yes   Functional Questionnaire: (I = Independent and D = Dependent) ADLs:   Bathing/Dressing-  i  Meal Prep- i  Eating- i  Maintaining continence- i  Transferring/Ambulation- i  Managing Meds- i  Follow up appointments reviewed:   PCP Hospital f/u appt confirmed? Yes  Scheduled to see Launa Grill 01/25/2019 @ 1015 via virtual visit .  [facility] f/u appt confirmed? Yes  Scheduled to see neurology in 3 weeks.  Are transportation arrangements needed? No  virtual   If their condition worsens, is the pt aware to call PCP or go to the Emergency Dept.? Yes  Was the patient provided with contact information for the PCP's office or ED? Yes  Was to pt encouraged to call back with questions or concerns? Yes

## 2019-01-21 NOTE — Progress Notes (Signed)
Carelink Summary Report / Loop Recorder 

## 2019-01-24 ENCOUNTER — Ambulatory Visit: Payer: PPO | Admitting: Physical Therapy

## 2019-01-24 ENCOUNTER — Other Ambulatory Visit: Payer: Self-pay

## 2019-01-24 ENCOUNTER — Telehealth: Payer: Self-pay

## 2019-01-24 ENCOUNTER — Ambulatory Visit: Payer: PPO | Attending: Neurology | Admitting: Occupational Therapy

## 2019-01-24 ENCOUNTER — Encounter: Payer: Self-pay | Admitting: Occupational Therapy

## 2019-01-24 DIAGNOSIS — M6281 Muscle weakness (generalized): Secondary | ICD-10-CM | POA: Diagnosis not present

## 2019-01-24 DIAGNOSIS — R4701 Aphasia: Secondary | ICD-10-CM | POA: Diagnosis not present

## 2019-01-24 DIAGNOSIS — I6310 Cerebral infarction due to embolism of unspecified precerebral artery: Secondary | ICD-10-CM | POA: Diagnosis not present

## 2019-01-24 NOTE — Therapy (Signed)
[place] MAIN [facility] SERVICES [address] [place], [name], [address] Phone: [phone]   Fax:  [phone]  Physical Therapy Treatment  Patient Details  Name: Andrew Cobb MRN: [id number] Date of Birth: Jan 22, 1951 No data recorded  Encounter Date: 01/24/2019    Past Medical History:  Diagnosis Date  . Arthritis   . Asthma   . Cancer [facility])    prostate  . Chronic venous insufficiency    varicose vein lower extremity with inflammation  . Coronary artery disease 1996   two stents placed   . Diabetes mellitus without complication ([place])    type 2 on metformin  . GERD (gastroesophageal reflux disease)    no issues since gastric bypass surgery as stated per pt  . Hyperlipidemia   . Hypogonadism in male   . MRSA (methicillin resistant Staphylococcus aureus) infection    07/30/2008 thru 08/07/2008  . Sleep apnea    on BIPAP  . Stented coronary artery   . Stroke ([place])   . Thrombocythemia [facility])     Past Surgical History:  Procedure Laterality Date  . ANGIOPLASTY    . ANGIOPLASTY     with stent 04/07/1995  . ANTERIOR CERVICAL DECOMPRESSION/DISCECTOMY FUSION 4 LEVELS N/A 08/17/2017   Procedure: Anterior discectomy with fusion and plate fixation Cervical Three-Four, Four-Five, Five-Six, and Six-Seven Fusion;  Surgeon: Ditty, Kevan Ny, MD;  Location: [place];  Service: Neurosurgery;  Laterality: N/A;  Anterior discectomy with fusion and plate fixation Cervical Three-Four, Four-Five, Five-Six, and Six-Seven Fusion   . APPENDECTOMY     1966  . BUBBLE STUDY  09/20/2018   Procedure: BUBBLE STUDY;  Surgeon: Josue Hector, MD;  Location: [place];  Service: Cardiovascular;;  . [place]  . CARDIOVASCULAR STRESS TEST     07/31/2011  . CARPAL TUNNEL RELEASE Left   . CHOLECYSTECTOMY     2006  . colonscopy      08/25/2012  . EYE SURGERY Bilateral    cataract  . FUNCTIONAL ENDOSCOPIC SINUS SURGERY     11/10/2013  .  GASTRIC BYPASS     10/05/2012  . HERNIA REPAIR     left inguinal 1981  . INCISION AND DRAINAGE ABSCESS Right 02/26/2017   Procedure: INCISION AND DRAINAGE ABSCESS;  Surgeon: Nickie Retort, MD;  Location: [facility];  Service: Urology;  Laterality: Right;  . JOINT REPLACEMENT     bilateral  . left ankle surgery      05/03/2003   . left carpel tunnel      09/18/1993  . left knee meniscal tear      01/25/2010  . left knee meniscal tear repair      05/04/1996  . left rotator cuff repair      05/03/2003   . LOOP RECORDER INSERTION N/A 09/20/2018   Procedure: LOOP RECORDER INSERTION;  Surgeon: Evans Lance, MD;  Location: [place] CV LAB;  Service: Cardiovascular;  Laterality: N/A;  . REPLACEMENT TOTAL KNEE BILATERAL  07/13/2015  . right ankle surgery      fracture has 2 screws 07/07/1997  . right carpel tunnel      05/16/1992  . right shoulder replacement      01/27/2006  . SCROTAL EXPLORATION Right 02/26/2017   Procedure: SCROTUM EXPLORATION;  Surgeon: Nickie Retort, MD;  Location: [facility];  Service: Urology;  Laterality: Right;  . TEE WITHOUT CARDIOVERSION N/A 09/20/2018   Procedure: TRANSESOPHAGEAL ECHOCARDIOGRAM (TEE);  Surgeon: Josue Hector,  MD;  Location: [place];  Service: Cardiovascular;  Laterality: N/A;  . TOTAL KNEE ARTHROPLASTY Left 07/13/2015   Procedure: LEFT TOTAL KNEE ARTHROPLASTY;  Surgeon: Gaynelle Arabian, MD;  Location: [facility];  Service: Orthopedics;  Laterality: Left;    There were no vitals filed for this visit.    PT/OT/SLP Screening Form   Time: in__2:00____     Time out__2:20___   Complaint ___Patient is here today for eval but he reports that he thinks he is at baseline so it was decided to do a free screen only________________ Past Medical Hx:  cva________________ Injury Date:___recent cva_____________________________  Pain Scale: __ _no pain_________ Patient's phone number:   Hx (this occurrence):  Patient had a cva in march 2020,  and then last week had a second cva  No Hx of falls     Assessment: Patient ambulates without AD and unlimited in community Transfers sit to stand without UE assist Strength of RLE is 3+/5 hip, 4/5 RLE knee and ankle strength of LLE is 4/5 hip / knee / ankle 5 x sit to stand 11 sec Berg balance 52/56 Gait speed 1.2 m/sec     Recommendations:  HEP for heel raises bilaterally , hip abd / hip ext with RTB standing   Comments:    []  Patient would benefit from an MD referral []  Patient would benefit from a full PT/OT/ SLP evaluation and treatment. [x]  No intervention recommended at this time.                                       Patient will benefit from skilled therapeutic intervention in order to improve the following deficits and impairments:     Visit Diagnosis: 1. Muscle weakness (generalized)        Problem List Patient Active Problem List   Diagnosis Date Noted  . 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 CVA (cerebral vascular accident) ([place]) 09/18/2018  . Coronary artery disease involving native coronary artery of native heart without angina pectoris   . H/O right coronary artery stent placement   . Allergic rhinitis 08/19/2018  . Cervical radiculopathy 08/17/2017  . Advanced care planning/counseling discussion 07/21/2017  . Skin lesion of right arm 12/16/2016  . Prostate cancer ([place]) 12/08/2016  . H/O bariatric surgery 07/17/2016  . Essential hypertension 04/24/2015  . Diabetes mellitus associated with hormonal etiology ([place]) 04/24/2015  . Chronic sinusitis 08/13/2009  . Hyperlipidemia 04/02/2009  . Coronary atherosclerosis 04/02/2009  . 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 OSA (obstructive sleep apnea) 04/02/2009  . Arthritis of shoulder region, degenerative 04/02/2009    Alanson Puls, PT  DPT 01/24/2019, 2:23 PM  [place] MAIN [facility] SERVICES [address] [place], [name], [address] Phone: [phone]   Fax:  [phone]  Name: Andrew Cobb MRN: [id number] Date of Birth: Jun 20, 1951

## 2019-01-24 NOTE — Telephone Encounter (Signed)
LINQ alert for pause event. ECG suggests SR w/ undersensing. Sensitivity previously programmed to most sensitive. Device implanted for CVA.

## 2019-01-24 NOTE — Telephone Encounter (Signed)
Ok to turn off pause alerts.

## 2019-01-25 ENCOUNTER — Encounter: Payer: Self-pay | Admitting: Family Medicine

## 2019-01-25 ENCOUNTER — Ambulatory Visit: Payer: PPO | Admitting: Speech Pathology

## 2019-01-25 ENCOUNTER — Ambulatory Visit (INDEPENDENT_AMBULATORY_CARE_PROVIDER_SITE_OTHER): Payer: PPO | Admitting: Family Medicine

## 2019-01-25 DIAGNOSIS — I6310 Cerebral infarction due to embolism of unspecified precerebral artery: Secondary | ICD-10-CM | POA: Diagnosis not present

## 2019-01-25 DIAGNOSIS — I10 Essential (primary) hypertension: Secondary | ICD-10-CM

## 2019-01-25 DIAGNOSIS — E782 Mixed hyperlipidemia: Secondary | ICD-10-CM | POA: Diagnosis not present

## 2019-01-25 DIAGNOSIS — I611 Nontraumatic intracerebral hemorrhage in hemisphere, cortical: Secondary | ICD-10-CM

## 2019-01-25 DIAGNOSIS — R4701 Aphasia: Secondary | ICD-10-CM

## 2019-01-25 DIAGNOSIS — E1169 Type 2 diabetes mellitus with other specified complication: Secondary | ICD-10-CM | POA: Diagnosis not present

## 2019-01-25 DIAGNOSIS — M6281 Muscle weakness (generalized): Secondary | ICD-10-CM | POA: Diagnosis not present

## 2019-01-25 NOTE — Progress Notes (Signed)
There were no vitals taken for this visit.   Subjective:    Patient ID: CASSIDY TABET, male    DOB: 08-12-1950, 68 y.o.   MRN: [id number]  HPI: KOEHN SALEHI is a 68 y.o. male  Chief Complaint  Patient presents with   Hospitalization Follow-up    Patient states no complaints     This visit was completed via telephone due to the restrictions of the COVID-19 pandemic. All issues as above were discussed and addressed. Physical exam was done as above through visual confirmation on telephone. If it was felt that the patient should be evaluated in the office, they were directed there. The patient verbally consented to this visit.  Location of the patient: home  Location of the provider: home  Those involved with this call:   Provider: Merrie Roof, PA-C  CMA: Merilyn Baba, [place] Desk/Registration: Jill Side   Time spent on call: 25 minutes on the phone discussing health concerns. 10 minutes total spent in review of patient's record and preparation of their chart. I verified patient identity using two factors (patient name and date of birth). Patient consents verbally to being seen via telemedicine visit today.   Presenting today for hospital f/u for CVA and intracerebral hemorrhage. Has some expressive aphasia lingering from event but has improved significantly since discharge. Has first appt with speech therapist tomorrow which he's hoping will help. Tolerating PO well, able to perform all ADLs independently since being back home. Has f/u with Neurology in the next week or two to discuss a plan for anticoagulation going forward given bleeding event.   Home BPs have been running around 120-130/70s. Was decreased from 40 mg to 20 mg benazepril during recent admission due to low BP readings in hospital. Now back to taking 40 mg benazepril.   Notes he recently had a steroid injection in his shoulder because he continues to deal with significant pain, BSs have been  elevated since. Home readings have been in the 140s, wanting to know what he needs to do about that.   HLD - Off cholesterol medication per neurohospitalists due to low LDL.   Transition of Care Hospital Follow up.   Hospital/Facility: [facility]  D/C Physician: Dr. Erlinda Hong D/C Date: 01/19/2019  Records Requested: already in epic Records Received: immediately Records Reviewed: 01/25/19  Diagnoses on Discharge: ICH, Left temporoparietal CVA  Date of interactive Contact within 48 hours of discharge: 01/20/19 Contact was through: phone  Date of 7 day or 14 day face-to-face visit: 01/25/19   within 7 days  Outpatient Encounter Medications as of 01/25/2019  Medication Sig Note   amitriptyline (ELAVIL) 25 MG tablet Take 1 tablet (25 mg total) by mouth at bedtime.    benazepril (LOTENSIN) 40 MG tablet Take 0.5 tablets (20 mg total) by mouth daily. (Patient taking differently: Take 40 mg by mouth daily. ) 01/28/2019: QAM   cetirizine (ZYRTEC) 10 MG tablet Take 10 mg by mouth daily.    Cholecalciferol (VITAMIN D) 2000 units CAPS Take 2,000 Units by mouth daily.    glucose blood test strip 1 each by Other route 2 (two) times daily. DX E11.9    Lancets (ONETOUCH ULTRASOFT) lancets 1 each by Other route 2 (two) times daily. Dx E11.9    Magnesium 250 MG TABS Take 250 mg by mouth at bedtime.     metFORMIN (GLUCOPHAGE) 500 MG tablet Take 1 tablet (500 mg total) by mouth 2 (two) times daily with a meal.  Multiple Vitamins-Minerals (MULTIVITAMIN PO) Take 1 tablet by mouth daily.     valACYclovir (VALTREX) 500 MG tablet Take 1 tablet (500 mg total) by mouth daily.    [DISCONTINUED] SUMAtriptan (IMITREX) 100 MG tablet Take 1 tablet (100 mg total) by mouth daily as needed for migraine. May repeat in 2 hours if headache persists or recurs. (Patient not taking: Reported on 01/28/2019)    [DISCONTINUED] albuterol (PROVENTIL HFA;VENTOLIN HFA) 108 (90 Base) MCG/ACT inhaler Inhale 1 puff into the lungs  every 6 (six) hours as needed for wheezing.     No facility-administered encounter medications on file as of 01/25/2019.     Diagnostic Tests Reviewed/Disposition: Labs, CTA Head and Neck, MRI brain  Consults: Neurology, OT, PT, Speech Therapy  Discharge Instructions: D/C statin, decrease benazepril, home PT, OT, Speech therapy  Disease/illness Education: given  Home Health/Community Services Discussions/Referrals: N/A  Establishment or re-establishment of referral orders for community resources: n/A  Discussion with other health care providers: none  Assessment and Support of treatment regimen adherence: excellent  Appointments Coordinated with: already scheduled with Neurology, home health orders already placed  Education for self-management, independent living, and ADLs: given  Relevant past medical, surgical, family and social history reviewed and updated as indicated. Interim medical history since our last visit reviewed. Allergies and medications reviewed and updated.  Review of Systems  Per HPI unless specifically indicated above     Objective:    There were no vitals taken for this visit.  Wt Readings from Last 3 Encounters:  01/17/19 203 lb 14.8 oz (92.5 kg)  12/31/18 195 lb 1.7 oz (88.5 kg)  10/01/18 195 lb 1.7 oz (88.5 kg)    Physical Exam  Unable to perform PE today given patient lack of access to video technology for today's visit  Results for orders placed or performed during the hospital encounter of 01/17/19  SARS Coronavirus 2 (CEPHEID - Performed in [place] hospital lab), [facility] Order   Specimen: Nasopharyngeal Swab  Result Value Ref Range   SARS Coronavirus 2 NEGATIVE NEGATIVE  MRSA PCR Screening   Specimen: Nasal Mucosa; Nasopharyngeal  Result Value Ref Range   MRSA by PCR POSITIVE (A) NEGATIVE  Ethanol  Result Value Ref Range   Alcohol, Ethyl (B) <10 <10 mg/dL  Protime-INR  Result Value Ref Range   Prothrombin Time 13.3 11.4 - 15.2  seconds   INR 1.0 0.8 - 1.2  APTT  Result Value Ref Range   aPTT 26 24 - 36 seconds  CBC  Result Value Ref Range   WBC 5.5 4.0 - 10.5 K/uL   RBC 4.62 4.22 - 5.81 MIL/uL   Hemoglobin 14.5 13.0 - 17.0 g/dL   HCT 44.3 39.0 - 52.0 %   MCV 95.9 80.0 - 100.0 fL   MCH 31.4 26.0 - 34.0 pg   MCHC 32.7 30.0 - 36.0 g/dL   RDW 12.2 11.5 - 15.5 %   Platelets 155 150 - 400 K/uL   nRBC 0.0 0.0 - 0.2 %  Differential  Result Value Ref Range   Neutrophils Relative % 57 %   Neutro Abs 3.2 1.7 - 7.7 K/uL   Lymphocytes Relative 30 %   Lymphs Abs 1.6 0.7 - 4.0 K/uL   Monocytes Relative 9 %   Monocytes Absolute 0.5 0.1 - 1.0 K/uL   Eosinophils Relative 3 %   Eosinophils Absolute 0.2 0.0 - 0.5 K/uL   Basophils Relative 0 %   Basophils Absolute 0.0 0.0 - 0.1 K/uL  Immature Granulocytes 1 %   Abs Immature Granulocytes 0.03 0.00 - 0.07 K/uL  Comprehensive metabolic panel  Result Value Ref Range   Sodium 136 135 - 145 mmol/L   Potassium 4.4 3.5 - 5.1 mmol/L   Chloride 102 98 - 111 mmol/L   CO2 24 22 - 32 mmol/L   Glucose, Bld 215 (H) 70 - 99 mg/dL   BUN 27 (H) 8 - 23 mg/dL   Creatinine, Ser 1.22 0.61 - 1.24 mg/dL   Calcium 9.3 8.9 - 10.3 mg/dL   Total Protein 6.5 6.5 - 8.1 g/dL   Albumin 4.2 3.5 - 5.0 g/dL   AST 26 15 - 41 U/L   ALT 36 0 - 44 U/L   Alkaline Phosphatase 61 38 - 126 U/L   Total Bilirubin 0.9 0.3 - 1.2 mg/dL   GFR calc non Af Amer >60 >60 mL/min   GFR calc Af Amer >60 >60 mL/min   Anion gap 10 5 - 15  Urinalysis, Routine w reflex microscopic  Result Value Ref Range   Color, Urine COLORLESS (A) YELLOW   APPearance CLEAR CLEAR   Specific Gravity, Urine 1.004 (L) 1.005 - 1.030   pH 6.0 5.0 - 8.0   Glucose, UA NEGATIVE NEGATIVE mg/dL   Hgb urine dipstick NEGATIVE NEGATIVE   Bilirubin Urine NEGATIVE NEGATIVE   Ketones, ur NEGATIVE NEGATIVE mg/dL   Protein, ur NEGATIVE NEGATIVE mg/dL   Nitrite NEGATIVE NEGATIVE   Leukocytes,Ua NEGATIVE NEGATIVE  Hemoglobin A1c  Result  Value Ref Range   Hgb A1c MFr Bld 8.0 (H) 4.8 - 5.6 %   Mean Plasma Glucose 182.9 mg/dL  Lipid panel  Result Value Ref Range   Cholesterol 113 0 - 200 mg/dL   Triglycerides 202 (H) <150 mg/dL   HDL 51 >40 mg/dL   Total CHOL/HDL Ratio 2.2 RATIO   VLDL 40 0 - 40 mg/dL   LDL Cholesterol 22 0 - 99 mg/dL  Glucose, capillary  Result Value Ref Range   Glucose-Capillary 166 (H) 70 - 99 mg/dL   Comment 1 Notify RN    Comment 2 Document in Chart   Glucose, capillary  Result Value Ref Range   Glucose-Capillary 193 (H) 70 - 99 mg/dL  Glucose, capillary  Result Value Ref Range   Glucose-Capillary 135 (H) 70 - 99 mg/dL   Comment 1 Notify RN    Comment 2 Document in Chart   Glucose, capillary  Result Value Ref Range   Glucose-Capillary 209 (H) 70 - 99 mg/dL   Comment 1 Notify RN    Comment 2 Document in Chart   CBC  Result Value Ref Range   WBC 8.2 4.0 - 10.5 K/uL   RBC 4.89 4.22 - 5.81 MIL/uL   Hemoglobin 15.3 13.0 - 17.0 g/dL   HCT 46.6 39.0 - 52.0 %   MCV 95.3 80.0 - 100.0 fL   MCH 31.3 26.0 - 34.0 pg   MCHC 32.8 30.0 - 36.0 g/dL   RDW 12.3 11.5 - 15.5 %   Platelets 147 (L) 150 - 400 K/uL   nRBC 0.0 0.0 - 0.2 %  Basic metabolic panel  Result Value Ref Range   Sodium 136 135 - 145 mmol/L   Potassium 4.3 3.5 - 5.1 mmol/L   Chloride 104 98 - 111 mmol/L   CO2 21 (L) 22 - 32 mmol/L   Glucose, Bld 152 (H) 70 - 99 mg/dL   BUN 16 8 - 23 mg/dL   Creatinine, Ser 1.14  0.61 - 1.24 mg/dL   Calcium 9.1 8.9 - 10.3 mg/dL   GFR calc non Af Amer >60 >60 mL/min   GFR calc Af Amer >60 >60 mL/min   Anion gap 11 5 - 15  Glucose, capillary  Result Value Ref Range   Glucose-Capillary 264 (H) 70 - 99 mg/dL  Glucose, capillary  Result Value Ref Range   Glucose-Capillary 108 (H) 70 - 99 mg/dL   Comment 1 Notify RN    Comment 2 Document in Chart   Glucose, capillary  Result Value Ref Range   Glucose-Capillary 134 (H) 70 - 99 mg/dL   Comment 1 Notify RN    Comment 2 Document in Chart     Glucose, capillary  Result Value Ref Range   Glucose-Capillary 139 (H) 70 - 99 mg/dL  I-stat chem 8, ED  Result Value Ref Range   Sodium 136 135 - 145 mmol/L   Potassium 4.2 3.5 - 5.1 mmol/L   Chloride 102 98 - 111 mmol/L   BUN 29 (H) 8 - 23 mg/dL   Creatinine, Ser 1.10 0.61 - 1.24 mg/dL   Glucose, Bld 208 (H) 70 - 99 mg/dL   Calcium, Ion 1.15 1.15 - 1.40 mmol/L   TCO2 24 22 - 32 mmol/L   Hemoglobin 14.6 13.0 - 17.0 g/dL   HCT 43.0 39.0 - 52.0 %  CBG monitoring, ED  Result Value Ref Range   Glucose-Capillary 197 (H) 70 - 99 mg/dL      Assessment & Plan:   Problem List Items Addressed This Visit      Cardiovascular and Mediastinum   Essential hypertension    BPs per patient stable and WNL, continue 40 mg benazepril      CVA (cerebral vascular accident) ([place])     Endocrine   Diabetes mellitus associated with hormonal etiology ([place])    Elevated recently, per patient started when he received steroid injection for shoulder. Will continue to monitor closely for improvement and increase medication regimen if persistently elevated. 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 petechial hmg and new L parietal hmg into old L MCA infarct - Primary    With some lingering expressive aphasia, due to start working with home speech therapy, PT, and OT. Awaiting Neurology f/u next week for further recommendations on anticoagulation regimen        Other   Hyperlipidemia    Hold statin per Neurologist due to low LDL          Follow up plan: Return for as scheduled.

## 2019-01-26 ENCOUNTER — Ambulatory Visit: Payer: PPO | Admitting: Physical Therapy

## 2019-01-26 ENCOUNTER — Encounter: Payer: Self-pay | Admitting: Speech Pathology

## 2019-01-26 ENCOUNTER — Other Ambulatory Visit: Payer: Self-pay

## 2019-01-26 ENCOUNTER — Ambulatory Visit: Payer: PPO | Admitting: Occupational Therapy

## 2019-01-26 NOTE — Therapy (Signed)
[place] MAIN [facility] SERVICES [address] [place], [name], [address] Phone: [phone]   Fax:  [phone]  Speech Language Pathology Evaluation  Patient Details  Name: Andrew Cobb MRN: [id number] Date of Birth: 1951/01/11 Referring Provider (SLP): Rosalin Hawking   Encounter Date: 01/25/2019  End of Session - 01/26/19 1412    Visit Number  1    Number of Visits  25    Date for SLP Re-Evaluation  04/18/19    Authorization Type  Medicare    Authorization Time Period  Start 01/25/2019    Authorization - Visit Number  1    Authorization - Number of Visits  10    SLP Start Time  1400    SLP Stop Time   1500    SLP Time Calculation (min)  60 min    Activity Tolerance  Patient tolerated treatment well       Past Medical History:  Diagnosis Date  . Arthritis   . Asthma   . Cancer [facility])    prostate  . Chronic venous insufficiency    varicose vein lower extremity with inflammation  . Coronary artery disease 1996   two stents placed   . Diabetes mellitus without complication ([place])    type 2 on metformin  . GERD (gastroesophageal reflux disease)    no issues since gastric bypass surgery as stated per pt  . Hyperlipidemia   . Hypogonadism in male   . MRSA (methicillin resistant Staphylococcus aureus) infection    07/30/2008 thru 08/07/2008  . Sleep apnea    on BIPAP  . Stented coronary artery   . Stroke ([place])   . Thrombocythemia [facility])     Past Surgical History:  Procedure Laterality Date  . ANGIOPLASTY    . ANGIOPLASTY     with stent 04/07/1995  . ANTERIOR CERVICAL DECOMPRESSION/DISCECTOMY FUSION 4 LEVELS N/A 08/17/2017   Procedure: Anterior discectomy with fusion and plate fixation Cervical Three-Four, Four-Five, Five-Six, and Six-Seven Fusion;  Surgeon: Ditty, Kevan Ny, MD;  Location: [place];  Service: Neurosurgery;  Laterality: N/A;  Anterior discectomy with fusion and plate fixation Cervical Three-Four, Four-Five,  Five-Six, and Six-Seven Fusion   . APPENDECTOMY     1966  . BUBBLE STUDY  09/20/2018   Procedure: BUBBLE STUDY;  Surgeon: Josue Hector, MD;  Location: [place];  Service: Cardiovascular;;  . [place]  . CARDIOVASCULAR STRESS TEST     07/31/2011  . CARPAL TUNNEL RELEASE Left   . CHOLECYSTECTOMY     2006  . colonscopy      08/25/2012  . EYE SURGERY Bilateral    cataract  . FUNCTIONAL ENDOSCOPIC SINUS SURGERY     11/10/2013  . GASTRIC BYPASS     10/05/2012  . HERNIA REPAIR     left inguinal 1981  . INCISION AND DRAINAGE ABSCESS Right 02/26/2017   Procedure: INCISION AND DRAINAGE ABSCESS;  Surgeon: Nickie Retort, MD;  Location: [facility];  Service: Urology;  Laterality: Right;  . JOINT REPLACEMENT     bilateral  . left ankle surgery      05/03/2003   . left carpel tunnel      09/18/1993  . left knee meniscal tear      01/25/2010  . left knee meniscal tear repair      05/04/1996  . left rotator cuff repair      05/03/2003   . LOOP RECORDER INSERTION N/A 09/20/2018   Procedure: LOOP  RECORDER INSERTION;  Surgeon: Evans Lance, MD;  Location: [place] CV LAB;  Service: Cardiovascular;  Laterality: N/A;  . REPLACEMENT TOTAL KNEE BILATERAL  07/13/2015  . right ankle surgery      fracture has 2 screws 07/07/1997  . right carpel tunnel      05/16/1992  . right shoulder replacement      01/27/2006  . SCROTAL EXPLORATION Right 02/26/2017   Procedure: SCROTUM EXPLORATION;  Surgeon: Nickie Retort, MD;  Location: [facility];  Service: Urology;  Laterality: Right;  . TEE WITHOUT CARDIOVERSION N/A 09/20/2018   Procedure: TRANSESOPHAGEAL ECHOCARDIOGRAM (TEE);  Surgeon: Josue Hector, MD;  Location[facility] ENDOSCOPY;  Service: Cardiovascular;  Laterality: N/A;  . TOTAL KNEE ARTHROPLASTY Left 07/13/2015   Procedure: LEFT TOTAL KNEE ARTHROPLASTY;  Surgeon: Gaynelle Arabian, MD;  Location: [facility];  Service: Orthopedics;  Laterality: Left;    There were no vitals  filed for this visit.      SLP Evaluation [facility] - 01/26/19 0001      SLP Visit Information   SLP Received On  01/25/19    Referring Provider (SLP)  Rosalin Hawking    Onset Date  01/17/2019    Medical Diagnosis  CVA      Subjective   Subjective  "I can't get my thoughts together"    Patient/Family Stated Goal  Maximize self-expression      Pain Assessment   Currently in Pain?  No/denies      General Information   HPI  Andrew Cobb is a 68 y.o. male with history of thrombocythemia, stroke, stent in coronary artery, hyperlipidemia, diabetes, CAD.  S/P (01/17/2019) ICH (intracerebral hemorrhage) (HCC) L temporoparietal infarct w. petechial hemorrhage and new L parietal hemorrhage into old L MCA infarct.      Prior Functional Status   Cognitive/Linguistic Baseline  Within functional limits      Cognition   Overall Cognitive Status  Within Functional Limits for tasks assessed      Auditory Comprehension   Overall Auditory Comprehension  Impaired    Yes/No Questions  Impaired    Commands  Impaired      Reading Comprehension   Reading Status  Impaired    Paragraph Level  0-25% accurate      Expression   Primary Mode of Expression  Verbal      Verbal Expression   Overall Verbal Expression  Impaired      Oral Motor/Sensory Function   Overall Oral Motor/Sensory Function  Appears within functional limits for tasks assessed      Motor Speech   Overall Motor Speech  Appears within functional limits for tasks assessed      Standardized Assessments   Standardized Assessments   Western Aphasia Battery revised         Western Aphasia Battery- Revised  Spontaneous Speech      Information content  9/10       Fluency   8/10     Comprehension     Yes/No questions  57/60        Auditory Word Recognition 59/60        Sequential Commands 59/80    Repetition   66/100     Naming    Object Naming  57/60        Word Fluency   16/20        Sentence Completion 8/10         Responsive Speech   8/10     Aphasia  Quotient  80.2/100   Reading and Writing    Reading   88/100     SLP Education - 01/26/19 1411    Education Details  Results and recommendations    Person(s) Educated  Patient    Methods  Explanation    Comprehension  Verbalized understanding         SLP Long Term Goals - 01/26/19 1415      SLP LONG TERM GOAL #1   Title  Patient will name common objects, without extraneous/irrelevant circumlocution, with 80% accuracy.    Time  12    Period  Weeks    Status  New    Target Date  04/27/19      SLP LONG TERM GOAL #2   Title  Patient will generate grammatical and cogent sentence to complete simple/concrete linguistic task with 80% accuracy.    Time  12    Period  Weeks    Status  New    Target Date  04/27/19      SLP LONG TERM GOAL #3   Title  Patient will complete high level word finding tasks with 80% accuracy.    Time  12    Period  Weeks    Status  New    Target Date  04/27/19      SLP LONG TERM GOAL #4   Title  Patient will complete complex auditory attention/vigilance/memory tasks with 80% accuracy.    Time  12    Period  Weeks    Status  New    Target Date  04/27/19      SLP LONG TERM GOAL #5   Title  Patient will write grammatical and cogent sentence to complete simple/concrete linguistic task with 80% accuracy.    Time  12    Period  Weeks    Status  New    Target Date  04/27/19      Additional Long Term Goals   Additional Long Term Goals  Yes      SLP LONG TERM GOAL #6   Title  Patient will demonstrate reading comprehension for paragraphs with 80% accuracy.    Time  12    Period  Weeks    Status  New    Target Date  04/27/19       Plan - 01/26/19 1414    Clinical Impression Statement  At 8 days post onset left temporoparietal infarct, the patient is presenting with mild aphasia characterized by expressive aphasia (circumlocutory, relatively fluent speech with word-finding difficulty, paraphasias, and  irrelevant/egocentric language); receptive aphasia (accurate comprehension of words with significant difficulty with lengthy/complex language); and impaired repetition skills.    The patient would benefit from skilled speech therapy for restorative and compensatory treatment of aphasia.    Speech Therapy Frequency  2x / week    Duration  Other (comment)   12 weeks   Treatment/Interventions  Language facilitation;SLP instruction and feedback;Patient/family education    Potential to Achieve Goals  Good    Potential Considerations  Ability to learn/carryover information;Previous level of function;Co-morbidities;Severity of impairments;Cooperation/participation level;Family/community support    Consulted and Agree with Plan of Care  Patient       Patient will benefit from skilled therapeutic intervention in order to improve the following deficits and impairments:   1. Aphasia       Problem List Patient Active Problem List   Diagnosis Date Noted  . 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 petechial hmg and new L  parietal hmg into old L MCA infarct 01/17/2019  . CVA (cerebral vascular accident) ([place]) 09/18/2018  . Coronary artery disease involving native coronary artery of native heart without angina pectoris   . H/O right coronary artery stent placement   . Allergic rhinitis 08/19/2018  . Cervical radiculopathy 08/17/2017  . Advanced care planning/counseling discussion 07/21/2017  . Skin lesion of right arm 12/16/2016  . Prostate cancer ([place]) 12/08/2016  . H/O bariatric surgery 07/17/2016  . Essential hypertension 04/24/2015  . Diabetes mellitus associated with hormonal etiology ([place]) 04/24/2015  . Chronic sinusitis 08/13/2009  . Hyperlipidemia 04/02/2009  . Coronary atherosclerosis 04/02/2009  . 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 OSA (obstructive sleep apnea) 04/02/2009  . Arthritis of shoulder region, degenerative 04/02/2009   Leroy Sea, MS/CCC- SLP  Lou Miner 01/26/2019, 2:23 PM  [place] MAIN [facility] SERVICES [address] [place], [name], [address] Phone: [phone]   Fax:  [phone]  Name: RAYANSH HERBST MRN: [id number] Date of Birth: 1951/04/18

## 2019-01-26 NOTE — Telephone Encounter (Signed)
Opened in error

## 2019-01-27 NOTE — Therapy (Signed)
[place] MAIN [facility] SERVICES [address] [place], [name], [address] Phone: [phone]   Fax:  [phone]  Occupational Therapy Evaluation  Patient Details  Name: Andrew Cobb MRN: [id number] Date of Birth: 1950-09-11 No data recorded  Encounter Date: 01/24/2019  OT End of Session - 01/27/19 1223    Visit Number  1    Number of Visits  1    OT Start Time  1300    OT Stop Time  1354    OT Time Calculation (min)  54 min    Activity Tolerance  Patient tolerated treatment well    Behavior During Therapy  [facility] for tasks assessed/performed       Past Medical History:  Diagnosis Date  . Arthritis   . Asthma   . Cancer [facility])    prostate  . Chronic venous insufficiency    varicose vein lower extremity with inflammation  . Coronary artery disease 1996   two stents placed   . Diabetes mellitus without complication ([place])    type 2 on metformin  . GERD (gastroesophageal reflux disease)    no issues since gastric bypass surgery as stated per pt  . Hyperlipidemia   . Hypogonadism in male   . MRSA (methicillin resistant Staphylococcus aureus) infection    07/30/2008 thru 08/07/2008  . Sleep apnea    on BIPAP  . Stented coronary artery   . Stroke ([place])   . Thrombocythemia [facility])     Past Surgical History:  Procedure Laterality Date  . ANGIOPLASTY    . ANGIOPLASTY     with stent 04/07/1995  . ANTERIOR CERVICAL DECOMPRESSION/DISCECTOMY FUSION 4 LEVELS N/A 08/17/2017   Procedure: Anterior discectomy with fusion and plate fixation Cervical Three-Four, Four-Five, Five-Six, and Six-Seven Fusion;  Surgeon: Ditty, Kevan Ny, MD;  Location: [place];  Service: Neurosurgery;  Laterality: N/A;  Anterior discectomy with fusion and plate fixation Cervical Three-Four, Four-Five, Five-Six, and Six-Seven Fusion   . APPENDECTOMY     1966  . BUBBLE STUDY  09/20/2018   Procedure: BUBBLE STUDY;  Surgeon: Josue Hector, MD;  Location: [place];   Service: Cardiovascular;;  . [place]  . CARDIOVASCULAR STRESS TEST     07/31/2011  . CARPAL TUNNEL RELEASE Left   . CHOLECYSTECTOMY     2006  . colonscopy      08/25/2012  . EYE SURGERY Bilateral    cataract  . FUNCTIONAL ENDOSCOPIC SINUS SURGERY     11/10/2013  . GASTRIC BYPASS     10/05/2012  . HERNIA REPAIR     left inguinal 1981  . INCISION AND DRAINAGE ABSCESS Right 02/26/2017   Procedure: INCISION AND DRAINAGE ABSCESS;  Surgeon: Nickie Retort, MD;  Location: [facility] ORS;  Service: Urology;  Laterality: Right;  . JOINT REPLACEMENT     bilateral  . left ankle surgery      05/03/2003   . left carpel tunnel      09/18/1993  . left knee meniscal tear      01/25/2010  . left knee meniscal tear repair      05/04/1996  . left rotator cuff repair      05/03/2003   . LOOP RECORDER INSERTION N/A 09/20/2018   Procedure: LOOP RECORDER INSERTION;  Surgeon: Evans Lance, MD;  Location: [place] CV LAB;  Service: Cardiovascular;  Laterality: N/A;  . REPLACEMENT TOTAL KNEE BILATERAL  07/13/2015  . right ankle surgery  fracture has 2 screws 07/07/1997  . right carpel tunnel      05/16/1992  . right shoulder replacement      01/27/2006  . SCROTAL EXPLORATION Right 02/26/2017   Procedure: SCROTUM EXPLORATION;  Surgeon: Nickie Retort, MD;  Location: [facility] ORS;  Service: Urology;  Laterality: Right;  . TEE WITHOUT CARDIOVERSION N/A 09/20/2018   Procedure: TRANSESOPHAGEAL ECHOCARDIOGRAM (TEE);  Surgeon: Josue Hector, MD;  Location[facility];  Service: Cardiovascular;  Laterality: N/A;  . TOTAL KNEE ARTHROPLASTY Left 07/13/2015   Procedure: LEFT TOTAL KNEE ARTHROPLASTY;  Surgeon: Gaynelle Arabian, MD;  Location: [facility];  Service: Orthopedics;  Laterality: Left;    There were no vitals filed for this visit.  Subjective Assessment - 01/27/19 1206    Subjective   Patient reports he is back to doing things for himself but still has issues with speech therapy.   Patient reports he Works for [employer], had a headache, head felt like it was spinning, speech was not clear so his wife took him to the hospital.    Pertinent History  Andrew Cobb is a 68 y.o. male with history of thrombocythemia, stroke, stent in coronary artery, hyperlipidemia, diabetes, CAD.  S/P (01/17/2019) ICH (intracerebral hemorrhage) (HCC) L temporoparietal infarct w. petechial hemorrhage and new L parietal hemorrhage into old L MCA infarct.    Patient Stated Goals  Patient reports "I want my speech to be normal again."    Currently in Pain?  No/denies    Pain Score  0-No pain    Multiple Pain Sites  No        [facility] OT Assessment - 01/27/19 1222      Assessment   Medical Diagnosis  CVA    Onset Date/Surgical Date  01/17/19    Prior Therapy  SLP, OT< PT      Precautions   Precautions  Shoulder      Balance Screen   Has the patient fallen in the past 6 months  No    Has the patient had a decrease in activity level because of a fear of falling?   No    Is the patient reluctant to leave their home because of a fear of falling?   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  Plays [employer]      IADL   Prior Level of Function Shopping  independent    Shopping  Needs to be accompanied on any shopping trip    Prior Level of Function Light Housekeeping  independent    Light Housekeeping  Needs help with all home maintenance tasks    Prior Level of Function Meal Prep  independent    Meal Prep  Able to complete simple cold meal and snack prep    Prior Level of Function Community Mobility  independent    Community Mobility  Relies on family or friends for transportation    Prior Level of Function Medication Managment  independent    Medication Management  Is responsible for taking medication in correct dosages at correct time    Prior Level of Function Loss adjuster, chartered financial matters independently (budgets, writes checks, pays rent, bills goes to bank), collects and keeps track of income   wife does most of the finances     Mobility   Mobility Status  Independent    Mobility Status Comments  no assistive device      Vision - History   Baseline Vision  No visual deficits    Additional Comments  Patient recently had a cataract surgery and vision was corrected      Cognition   Overall Cognitive Status  Within Functional Limits for tasks assessed    Memory  Appears intact    Problem Solving  Appears intact      Sensation   Additional Comments  denies any numbness or tingling in arms and hands      Coordination   9 Hole Peg Test  Right;Left    Right 9 Hole Peg Test  27 secs    Left 9 Hole Peg Test  32 secs      AROM   Overall AROM   Deficits     Overall AROM Comments  bilateral shoulder flexion to 125 degrees, 120 degrees, elbow flexion full, extension full, bilateral wrists WFL (history of carpal tunnel)        Hand Function   Right Hand Grip (lbs)  53    Right Hand Lateral Pinch  23 lbs    Right Hand 3 Point Pinch  18 lbs    Left Hand Grip (lbs)  58    Left Hand Lateral Pinch  16 lbs    Left 3 point pinch  14 lbs    Comment  2 point pinch right 16, left 12                      OT Education - 01/27/19 1222    Education Details  OT eval and findings    Person(s) Educated  Patient;Spouse    Methods  Explanation    Comprehension  Verbalized understanding          OT Long Term Goals - 01/27/19 1206      OT LONG TERM GOAL #1   Title  Patient did not require any OT needs at the present time.  Plan - 01/27/19 1223    Clinical Impression Statement  Patient is a 68 yo male who suffered 2 recent CVAs in the last few months and presents for OT evaluation.  Patient was evaluated and appears back to baseline in terms of ADL tasks.  He has not returned yet to driving and his wife is limiting his IADL tasks due to current weather conditions with extreme heat and most of the tasks he performs are outside.  He appears to have good strength, ROM and ability to engage in daily tasks indoors and does not appear to require any OT services at this time, Patient to follow up with SLP since this is where his deficits appear.    OT Occupational Profile and History  Detailed Assessment- Review of Records and additional review of physical, cognitive, psychosocial history related to current functional performance    Occupational performance deficits (Please refer to evaluation for details):  IADL's    Rehab Potential  Good    Clinical Decision Making  Limited treatment options, no task modification necessary    Comorbidities Affecting Occupational Performance:  None    Modification or Assistance to Complete  Evaluation   No modification of tasks or assist necessary to complete eval    OT Frequency  One time visit    Consulted and Agree with Plan of Care  Patient;Family member/caregiver    Family Member Consulted  wife       Patient will benefit from skilled therapeutic intervention in order to improve the following deficits and impairments:           Visit Diagnosis: 1. Muscle weakness (generalized)   2. 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 CVA (cerebral vascular accident) ([place]) 09/18/2018  . Coronary artery disease involving native coronary artery of native heart without angina pectoris   . H/O right coronary artery stent placement   . Allergic rhinitis 08/19/2018  . Cervical radiculopathy 08/17/2017  . Advanced care planning/counseling discussion 07/21/2017  . Skin lesion of right arm 12/16/2016  . Prostate cancer ([place]) 12/08/2016  . H/O bariatric surgery 07/17/2016  . Essential hypertension 04/24/2015  . Diabetes mellitus associated with hormonal etiology ([place]) 04/24/2015  . Chronic sinusitis 08/13/2009  . Hyperlipidemia 04/02/2009  . Coronary atherosclerosis 04/02/2009  . 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 OSA (obstructive sleep apnea) 04/02/2009  . Arthritis of shoulder region, degenerative 04/02/2009   Kela Baccari T Tomasita Morrow, OTR/L, CLT  Bairon Klemann 01/27/2019, 12:28 PM  [place] MAIN [facility] SERVICES [address] [place], [name], [address] Phone: [phone]   Fax:  [phone]  Name: RAMESSES CRAMPTON MRN: [id number] Date of Birth: 07-Jan-1951

## 2019-01-28 ENCOUNTER — Ambulatory Visit: Payer: PPO | Admitting: Pharmacist

## 2019-01-28 DIAGNOSIS — E119 Type 2 diabetes mellitus without complications: Secondary | ICD-10-CM

## 2019-01-28 NOTE — Patient Instructions (Signed)
Visit Information  Goals Addressed            This Visit's Progress     Patient Stated   . "I want to work on my diabetes" (pt-stated)       Current Barriers:  . Diabetes: uncontrolled, most recent A1c 8.0%  . Current antihyperglycemic regimen: metformin 500 mg BID; notes higher doses years ago, but required less post-bariatric surgery  . Current exercise: Notes that he walks 1.5 miles QAM with no concerns . Current blood glucose readings: fasting sugars 130-150 fasting, though notes that he received a cortisone shot 7/8 which worsened sugars . Cardiovascular risk reduction: o Current hypertensive regimen: benazepril 40 mg daily, BP well controlled at home 110-120s/70-80s o Current hyperlipidemia regimen: atorvastatin 80 mg daily, last LDL very well controlled <70  Pharmacist Clinical Goal(s):  [name] Kitchen Over the next 90 days, patient with work with PharmD and primary care provider to address optimized hyperglycemic management  Interventions: . Comprehensive medication review performed . Discussed that with elevated A1c, recommend increasing metformin to max 1000 mg BID. Renal function appropriate for maximization . Will continue to discuss diet and exercise recommendations moving forward   Patient Self Care Activities:  . Patient will check blood glucose daily, document, and provide at future appointments . Patient will take medications as prescribed . Patient will report any questions or concerns to provider   Initial goal documentation        Print copy of patient instructions provided.   Plan: - Will collaborate with Dr. Jeananne Rama on above recommendations - Will outreach patient in the next 4-5 weeks for continued medication management support  Catie Darnelle Maffucci, PharmD Clinical Pharmacist [place] [phone]

## 2019-01-28 NOTE — Chronic Care Management (AMB) (Signed)
Chronic Care Management   Note  01/28/2019 Name: Andrew Cobb MRN: [id number] DOB: 1950/09/06   Subjective:  Andrew Cobb is a 68 y.o. year old male who is a primary care patient of Crissman, Jeannette How, MD. The CCM team was consulted for assistance with chronic disease management and care coordination needs.     Andrew Cobb was given information about Chronic Care Management services today including:  1. CCM service includes personalized support from designated clinical staff supervised by his physician, including individualized plan of care and coordination with other care providers 2. 24/7 contact phone numbers for assistance for urgent and routine care needs. 3. Service will only be billed when office clinical staff spend 20 minutes or more in a month to coordinate care. 4. Only one practitioner may furnish and bill the service in a calendar month. 5. The patient may stop CCM services at any time (effective at the end of the month) by phone call to the office staff. 6. The patient will be responsible for cost sharing (co-pay) of up to 20% of the service fee (after annual deductible is met).  Patient agreed to services and verbal consent obtained.   Review of patient status, including review of consultants reports, laboratory and other test data, was performed as part of comprehensive evaluation and provision of chronic care management services.   Objective:  Lab Results  Component Value Date   CREATININE 1.14 01/19/2019   CREATININE 1.10 01/17/2019   CREATININE 1.22 01/17/2019    Lab Results  Component Value Date   HGBA1C 8.0 (H) 01/17/2019       Component Value Date/Time   CHOL 113 01/17/2019 1547   CHOL 111 01/25/2018 0815   TRIG 202 (H) 01/17/2019 1547   TRIG 150 (H) 01/25/2018 0815   HDL 51 01/17/2019 1547   HDL 47 07/21/2017 1144   CHOLHDL 2.2 01/17/2019 1547   VLDL 40 01/17/2019 1547   VLDL 30 (H) 01/25/2018 0815   LDLCALC 22 01/17/2019 1547   LDLCALC  138 (H) 07/21/2017 1144    Clinical ASCVD: Yes  The ASCVD Risk score Andrew Bussing DC Jr., et al., 2013) failed to calculate for the following reasons:   The patient has a prior MI or stroke diagnosis    BP Readings from Last 3 Encounters:  01/19/19 127/89  12/31/18 139/79  10/18/18 138/75    Allergies  Allergen Reactions  . Succinylsulphathiazole Rash  . Sulfamethoxazole-Trimethoprim Rash  . Tetracyclines & Related Rash    Medications Reviewed Today    Reviewed by De Hollingshead, [facility] (Pharmacist) on 01/28/19 at 1027  Med List Status: <None>  Medication Order Taking? Sig Documenting Provider Last Dose Status Informant  amitriptyline (ELAVIL) 25 MG tablet [id number] Yes Take 1 tablet (25 mg total) by mouth at bedtime. Guadalupe Maple, MD Taking Active Multiple Informants  aspirin EC 81 MG tablet [id number] Yes Take 81 mg by mouth daily. [provider] Taking Active   atorvastatin (LIPITOR) 40 MG tablet [id number] Yes Take 40 mg by mouth daily. [provider] Taking Active   benazepril (LOTENSIN) 40 MG tablet [id number] Yes Take 0.5 tablets (20 mg total) by mouth daily.  Patient taking differently: Take 40 mg by mouth daily.    Donzetta Starch, NP Taking Active            Med Note Darnelle Maffucci, Mikko Lewellen E   Fri Jan 28, 2019 10:18 AM) QAM  cetirizine (ZYRTEC) 10 MG tablet [id number] Yes Take 10 mg  by mouth daily. [provider] Taking Active Multiple Informants  Cholecalciferol (VITAMIN D) 2000 units CAPS [id number] Yes Take 2,000 Units by mouth daily. [provider] Taking Active Multiple Informants  glucose blood test strip [id number] Yes 1 each by Other route 2 (two) times daily. DX E11.9 Valerie Roys, DO Taking Active Multiple Informants  Lancets (ONETOUCH ULTRASOFT) lancets [id number] Yes 1 each by Other route 2 (two) times daily. Dx E11.9 Arlis Porta., MD Taking Active Multiple Informants  Magnesium 250 MG TABS [id number] Yes Take 250 mg by  mouth at bedtime.  [provider] Taking Active Multiple Informants  metFORMIN (GLUCOPHAGE) 500 MG tablet [id number] Yes Take 1 tablet (500 mg total) by mouth 2 (two) times daily with a meal. Donzetta Starch, NP Taking Active   Multiple Vitamins-Minerals (MULTIVITAMIN PO) [id number] Yes Take 1 tablet by mouth daily.  [provider] Taking Active Multiple Informants  valACYclovir (VALTREX) 500 MG tablet [id number] Yes Take 1 tablet (500 mg total) by mouth daily. Guadalupe Maple, MD Taking Active Multiple Informants           Assessment:   Goals Addressed            This Visit's Progress     Patient Stated   . "I want to work on my diabetes" (pt-stated)       Current Barriers:  . Diabetes: uncontrolled, most recent A1c 8.0%  . Current antihyperglycemic regimen: metformin 500 mg BID; notes higher doses years ago, but required less post-bariatric surgery  . Current exercise: Notes that he walks 1.5 miles QAM with no concerns . Current blood glucose readings: fasting sugars 130-150 fasting, though notes that he received a cortisone shot 7/8 which worsened sugars . Cardiovascular risk reduction: o Current hypertensive regimen: benazepril 40 mg daily, BP well controlled at home 110-120s/70-80s o Current hyperlipidemia regimen: atorvastatin 80 mg daily, last LDL very well controlled <70  Pharmacist Clinical Goal(s):  [name] Kitchen Over the next 90 days, patient with work with PharmD and primary care provider to address optimized hyperglycemic management  Interventions: . Comprehensive medication review performed . Discussed that with elevated A1c, recommend increasing metformin to max 1000 mg BID. Renal function appropriate for maximization . Will continue to discuss diet and exercise recommendations moving forward   Patient Self Care Activities:  . Patient will check blood glucose daily, document, and provide at future appointments . Patient will take medications as prescribed  . Patient will report any questions or concerns to provider   Initial goal documentation        Plan: - Will collaborate with Dr. Jeananne Rama on above recommendations - Will outreach patient in the next 4-5 weeks for continued medication management support  Catie Darnelle Maffucci, PharmD Clinical Pharmacist [place] [phone]

## 2019-01-31 ENCOUNTER — Telehealth: Payer: Self-pay

## 2019-01-31 ENCOUNTER — Ambulatory Visit: Payer: PPO | Admitting: Physical Therapy

## 2019-01-31 DIAGNOSIS — I619 Nontraumatic intracerebral hemorrhage, unspecified: Secondary | ICD-10-CM | POA: Diagnosis not present

## 2019-01-31 NOTE — Assessment & Plan Note (Signed)
Elevated recently, per patient started when he received steroid injection for shoulder. Will continue to monitor closely for improvement and increase medication regimen if persistently elevated. Watch diet closely

## 2019-01-31 NOTE — Assessment & Plan Note (Signed)
BPs per patient stable and WNL, continue 40 mg benazepril

## 2019-01-31 NOTE — Telephone Encounter (Signed)

## 2019-01-31 NOTE — Assessment & Plan Note (Signed)
With some lingering expressive aphasia, due to start working with home speech therapy, PT, and OT. Awaiting Neurology f/u next week for further recommendations on anticoagulation regimen

## 2019-01-31 NOTE — Assessment & Plan Note (Signed)
Hold statin per Neurologist due to low LDL

## 2019-02-01 ENCOUNTER — Ambulatory Visit: Payer: PPO | Admitting: Speech Pathology

## 2019-02-01 ENCOUNTER — Ambulatory Visit (INDEPENDENT_AMBULATORY_CARE_PROVIDER_SITE_OTHER): Payer: PPO | Admitting: *Deleted

## 2019-02-01 ENCOUNTER — Encounter: Payer: Self-pay | Admitting: Speech Pathology

## 2019-02-01 ENCOUNTER — Other Ambulatory Visit: Payer: Self-pay

## 2019-02-01 DIAGNOSIS — M6281 Muscle weakness (generalized): Secondary | ICD-10-CM | POA: Diagnosis not present

## 2019-02-01 DIAGNOSIS — R4701 Aphasia: Secondary | ICD-10-CM

## 2019-02-01 DIAGNOSIS — I639 Cerebral infarction, unspecified: Secondary | ICD-10-CM

## 2019-02-01 LAB — CUP PACEART INCLINIC DEVICE CHECK
Date Time Interrogation Session: 20200728091211
Implantable Pulse Generator Implant Date: 20200316

## 2019-02-01 NOTE — Therapy (Signed)
[place] MAIN [facility] SERVICES [address] [place], [name], [address] Phone: [phone]   Fax:  [phone]  Speech Language Pathology Treatment  Patient Details  Name: Andrew Cobb MRN: [id number] Date of Birth: 19-May-1951 Referring Provider (SLP): Rosalin Hawking   Encounter Date: 02/01/2019  End of Session - 02/01/19 1758    Visit Number  2    Number of Visits  25    Date for SLP Re-Evaluation  04/18/19    Authorization Type  Medicare    Authorization Time Period  Start 01/25/2019    Authorization - Visit Number  2    Authorization - Number of Visits  10    SLP Start Time  1400    SLP Stop Time   1450    SLP Time Calculation (min)  50 min    Activity Tolerance  Patient tolerated treatment well       Past Medical History:  Diagnosis Date  . Arthritis   . Asthma   . Cancer [facility])    prostate  . Chronic venous insufficiency    varicose vein lower extremity with inflammation  . Coronary artery disease 1996   two stents placed   . Diabetes mellitus without complication ([place])    type 2 on metformin  . GERD (gastroesophageal reflux disease)    no issues since gastric bypass surgery as stated per pt  . Hyperlipidemia   . Hypogonadism in male   . MRSA (methicillin resistant Staphylococcus aureus) infection    07/30/2008 thru 08/07/2008  . Sleep apnea    on BIPAP  . Stented coronary artery   . Stroke ([place])   . Thrombocythemia [facility])     Past Surgical History:  Procedure Laterality Date  . ANGIOPLASTY    . ANGIOPLASTY     with stent 04/07/1995  . ANTERIOR CERVICAL DECOMPRESSION/DISCECTOMY FUSION 4 LEVELS N/A 08/17/2017   Procedure: Anterior discectomy with fusion and plate fixation Cervical Three-Four, Four-Five, Five-Six, and Six-Seven Fusion;  Surgeon: Ditty, Kevan Ny, MD;  Location: [place];  Service: Neurosurgery;  Laterality: N/A;  Anterior discectomy with fusion and plate fixation Cervical Three-Four, Four-Five, Five-Six,  and Six-Seven Fusion   . APPENDECTOMY     1966  . BUBBLE STUDY  09/20/2018   Procedure: BUBBLE STUDY;  Surgeon: Josue Hector, MD;  Location: [place];  Service: Cardiovascular;;  . [place]  . CARDIOVASCULAR STRESS TEST     07/31/2011  . CARPAL TUNNEL RELEASE Left   . CHOLECYSTECTOMY     2006  . colonscopy      08/25/2012  . EYE SURGERY Bilateral    cataract  . FUNCTIONAL ENDOSCOPIC SINUS SURGERY     11/10/2013  . GASTRIC BYPASS     10/05/2012  . HERNIA REPAIR     left inguinal 1981  . INCISION AND DRAINAGE ABSCESS Right 02/26/2017   Procedure: INCISION AND DRAINAGE ABSCESS;  Surgeon: Nickie Retort, MD;  Location: [facility];  Service: Urology;  Laterality: Right;  . JOINT REPLACEMENT     bilateral  . left ankle surgery      05/03/2003   . left carpel tunnel      09/18/1993  . left knee meniscal tear      01/25/2010  . left knee meniscal tear repair      05/04/1996  . left rotator cuff repair      05/03/2003   . LOOP RECORDER INSERTION N/A 09/20/2018   Procedure: LOOP  RECORDER INSERTION;  Surgeon: Evans Lance, MD;  Location: [place] CV LAB;  Service: Cardiovascular;  Laterality: N/A;  . REPLACEMENT TOTAL KNEE BILATERAL  07/13/2015  . right ankle surgery      fracture has 2 screws 07/07/1997  . right carpel tunnel      05/16/1992  . right shoulder replacement      01/27/2006  . SCROTAL EXPLORATION Right 02/26/2017   Procedure: SCROTUM EXPLORATION;  Surgeon: Nickie Retort, MD;  Location: [facility];  Service: Urology;  Laterality: Right;  . TEE WITHOUT CARDIOVERSION N/A 09/20/2018   Procedure: TRANSESOPHAGEAL ECHOCARDIOGRAM (TEE);  Surgeon: Josue Hector, MD;  Location: [facility] END[facility]COPY;  Service: Cardiovascular;  Laterality: N/A;  . TOTAL KNEE ARTHROPLASTY Left 07/13/2015   Procedure: LEFT TOTAL KNEE ARTHROPLASTY;  Surgeon: Gaynelle Arabian, MD;  Location: [facility];  Service: Orthopedics;  Laterality: Left;    There were no vitals filed for this  visit.  Subjective Assessment - 02/01/19 1757    Subjective  Patient reports frustration with verbal expression/reading comprehension and is motivated to improve skills in ST    Currently in Pain?  No/denies            ADULT SLP TREATMENT - 02/01/19 0001      General Information   Behavior/Cognition  Alert;Cooperative;Pleasant mood;Hard of hearing    HPI  Pt is a 68 y.o. male with history of thrombocythemia, stroke, stent in coronary artery, hyperlidemia, diabetes, CAD. S/P (01/17/2019) ICH (intracerebral hemorrhage) (HCC) L tempoparietal infarct w. petechial hemorrhage and new L parietal hemorrhage into old L MCA infarct      Treatment Provided   Treatment provided  Cognitive-Linquistic      Pain Assessment   Pain Assessment  No/denies pain      Cognitive-Linquistic Treatment   Treatment focused on  Aphasia    Skilled Treatment  OBJECTIVE: VERBAL EXPRESSION: In a rapid confrontation naming task, the patient independently generates the individual target noun with 75% accuracy, increases to 100% when cued to provide the target noun only. The patient generates the target verb based on a picture with 65% accuracy, increases to 90% when provided with semantic cues. AUDITORY COMPREHENSION: The patient follows multi-step commands in a picture manipulation task with 65% accuracy, improves to 80% when provided with repetition of direction. The patient responds to quantitative comparison yes/no questions with 95% accuracy.      Assessment / Recommendations / Plan   Plan  Continue with current plan of care      Progression Toward Goals   Progression toward goals  Progressing toward goals       SLP Education - 02/01/19 1757    Education Details  Noted improvements in auditory comprehension task, will continue to target word finding    Person(s) Educated  Patient    Methods  Explanation    Comprehension  Verbalized understanding         SLP Long Term Goals - 01/26/19 1415      SLP  LONG TERM GOAL #1   Title  Patient will name common objects, without extraneous/irrelevant circumlocution, with 80% accuracy.    Time  12    Period  Weeks    Status  New    Target Date  04/27/19      SLP LONG TERM GOAL #2   Title  Patient will generate grammatical and cogent sentence to complete simple/concrete linguistic task with 80% accuracy.    Time  12    Period  Weeks  Status  New    Target Date  04/27/19      SLP LONG TERM GOAL #3   Title  Patient will complete high level word finding tasks with 80% accuracy.    Time  12    Period  Weeks    Status  New    Target Date  04/27/19      SLP LONG TERM GOAL #4   Title  Patient will complete complex auditory attention/vigilance/memory tasks with 80% accuracy.    Time  12    Period  Weeks    Status  New    Target Date  04/27/19      SLP LONG TERM GOAL #5   Title  Patient will write grammatical and cogent sentence to complete simple/concrete linguistic task with 80% accuracy.    Time  12    Period  Weeks    Status  New    Target Date  04/27/19      Additional Long Term Goals   Additional Long Term Goals  Yes      SLP LONG TERM GOAL #6   Title  Patient will demonstrate reading comprehension for paragraphs with 80% accuracy.    Time  12    Period  Weeks    Status  New    Target Date  04/27/19       Plan - 02/01/19 1758    Clinical Impression Statement  The patient continues to present with a mild, fluent aphasia characterized by word-finding difficulties, circumlocutory speech, and comprehension deficits for more complex language. He expresses his frustrations with verbal expression (primarily with word-finding) and notes that he is having difficulty with processing text from the newspaper. Written expression does not appear to be an area of need for him based on results from the [place] writing subsection and patient self-assessment. The patient was able to accurately respond to relatively complex  quantitative comparison yes/no questions. He appears to use circumlocutory speech to aid in word retrieval during the confrontation naming task. Plan to encourage patient to continue to "talk around the word" as a strategy to assist in word finding, however, to limit this to an internal strategy opposed to verbalizing his circumlocutions. Will continue ST to target word-finding abilities for verbal expression and auditory/reading comprehension for more complex language.    Speech Therapy Frequency  2x / week    Duration  Other (comment)   12 weeks   Treatment/Interventions  Language facilitation;SLP instruction and feedback;Patient/family education    Potential to Achieve Goals  Good    Potential Considerations  Ability to learn/carryover information;Previous level of function;Co-morbidities;Severity of impairments;Cooperation/participation level;Family/community support    Consulted and Agree with Plan of Care  Patient       Patient will benefit from skilled therapeutic intervention in order to improve the following deficits and impairments:   1. Aphasia       Problem List Patient Active Problem List   Diagnosis Date Noted  . 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 CVA (cerebral vascular accident) ([place]) 09/18/2018  . Coronary artery disease involving native coronary artery of native heart without angina pectoris   . H/O right coronary artery stent placement   . Allergic rhinitis 08/19/2018  . Cervical radiculopathy 08/17/2017  . Advanced care planning/counseling discussion 07/21/2017  . Skin lesion of right arm 12/16/2016  . Prostate cancer ([place]) 12/08/2016  . H/O  bariatric surgery 07/17/2016  . Essential hypertension 04/24/2015  . Diabetes mellitus associated with hormonal etiology ([place]) 04/24/2015  . Chronic sinusitis 08/13/2009  . Hyperlipidemia 04/02/2009  . Coronary  atherosclerosis 04/02/2009  . 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 OSA (obstructive sleep apnea) 04/02/2009  . Arthritis of shoulder region, degenerative 04/02/2009    Thressa Sheller, Graduate Student Clinician 02/01/2019, 6:10 PM  [place] MAIN [facility] SERVICES [address] [place], [name], [address] Phone: [phone]   Fax:  [phone]   Name: Andrew Cobb MRN: [id number] Date of Birth: 1951-04-01

## 2019-02-01 NOTE — Progress Notes (Signed)
Loop check in clinic. Battery status: ok. R-waves 0.50mV. 0 symptom episodes, 0 tachy episodes, 5 pause episodes (available ECGs suggest SR w/ undersensing) okay to turn off pause alerts per GT, 0 brady episodes. 0 AF episodes (0% burden). Monthly summary reports and ROV with GT PRN. Name corrected in device.

## 2019-02-02 ENCOUNTER — Ambulatory Visit: Payer: PPO | Admitting: Physical Therapy

## 2019-02-02 ENCOUNTER — Encounter: Payer: PPO | Admitting: Occupational Therapy

## 2019-02-03 ENCOUNTER — Ambulatory Visit: Payer: PPO | Admitting: Speech Pathology

## 2019-02-03 ENCOUNTER — Other Ambulatory Visit: Payer: Self-pay

## 2019-02-03 DIAGNOSIS — R4701 Aphasia: Secondary | ICD-10-CM

## 2019-02-03 DIAGNOSIS — M6281 Muscle weakness (generalized): Secondary | ICD-10-CM | POA: Diagnosis not present

## 2019-02-04 ENCOUNTER — Encounter: Payer: Self-pay | Admitting: Speech Pathology

## 2019-02-04 NOTE — Therapy (Signed)
[place] MAIN [facility] SERVICES [address] [place], [name], [address] Phone: [phone]   Fax:  [phone]  Speech Language Pathology Treatment  Patient Details  Name: Andrew Cobb MRN: [id number] Date of Birth: [birth year] Referring Provider (SLP): Rosalin Hawking   Encounter Date: 02/03/2019  End of Session - 02/04/19 0823    Visit Number  3    Number of Visits  25    Date for SLP Re-Evaluation  04/18/19    Authorization Type  Medicare    Authorization Time Period  Start 01/25/2019    Authorization - Visit Number  3    Authorization - Number of Visits  10    SLP Start Time  5027    SLP Stop Time   1435    SLP Time Calculation (min)  50 min    Activity Tolerance  Patient tolerated treatment well       Past Medical History:  Diagnosis Date  . Arthritis   . Asthma   . Cancer [facility])    prostate  . Chronic venous insufficiency    varicose vein lower extremity with inflammation  . Coronary artery disease 1996   two stents placed   . Diabetes mellitus without complication ([place])    type 2 on metformin  . GERD (gastroesophageal reflux disease)    no issues since gastric bypass surgery as stated per pt  . Hyperlipidemia   . Hypogonadism in male   . MRSA (methicillin resistant Staphylococcus aureus) infection    07/30/2008 thru 08/07/2008  . Sleep apnea    on BIPAP  . Stented coronary artery   . Stroke ([place])   . Thrombocythemia [facility])     Past Surgical History:  Procedure Laterality Date  . ANGIOPLASTY    . ANGIOPLASTY     with stent 04/07/1995  . ANTERIOR CERVICAL DECOMPRESSION/DISCECTOMY FUSION 4 LEVELS N/A 08/17/2017   Procedure: Anterior discectomy with fusion and plate fixation Cervical Three-Four, Four-Five, Five-Six, and Six-Seven Fusion;  Surgeon: Ditty, Kevan Ny, MD;  Location: [place];  Service: Neurosurgery;  Laterality: N/A;  Anterior discectomy with fusion and plate fixation Cervical Three-Four, Four-Five, Five-Six,  and Six-Seven Fusion   . APPENDECTOMY     1966  . BUBBLE STUDY  09/20/2018   Procedure: BUBBLE STUDY;  Surgeon: Josue Hector, MD;  Location: [place];  Service: Cardiovascular;;  . [place]  . CARDIOVASCULAR STRESS TEST     07/31/2011  . CARPAL TUNNEL RELEASE Left   . CHOLECYSTECTOMY     2006  . colonscopy      08/25/2012  . EYE SURGERY Bilateral    cataract  . FUNCTIONAL ENDOSCOPIC SINUS SURGERY     11/10/2013  . GASTRIC BYPASS     10/05/2012  . HERNIA REPAIR     left inguinal 1981  . INCISION AND DRAINAGE ABSCESS Right 02/26/2017   Procedure: INCISION AND DRAINAGE ABSCESS;  Surgeon: Nickie Retort, MD;  Location: [facility];  Service: Urology;  Laterality: Right;  . JOINT REPLACEMENT     bilateral  . left ankle surgery      05/03/2003   . left carpel tunnel      09/18/1993  . left knee meniscal tear      01/25/2010  . left knee meniscal tear repair      05/04/1996  . left rotator cuff repair      05/03/2003   . LOOP RECORDER INSERTION N/A 09/20/2018   Procedure: LOOP  RECORDER INSERTION;  Surgeon: Evans Lance, MD;  Location: [place] CV LAB;  Service: Cardiovascular;  Laterality: N/A;  . REPLACEMENT TOTAL KNEE BILATERAL  07/13/2015  . right ankle surgery      fracture has 2 screws 07/07/1997  . right carpel tunnel      05/16/1992  . right shoulder replacement      01/27/2006  . SCROTAL EXPLORATION Right 02/26/2017   Procedure: SCROTUM EXPLORATION;  Surgeon: Nickie Retort, MD;  Location: [facility];  Service: Urology;  Laterality: Right;  . TEE WITHOUT CARDIOVERSION N/A 09/20/2018   Procedure: TRANSESOPHAGEAL ECHOCARDIOGRAM (TEE);  Surgeon: Josue Hector, MD;  Location: [facility];  Service: Cardiovascular;  Laterality: N/A;  . TOTAL KNEE ARTHROPLASTY Left 07/13/2015   Procedure: LEFT TOTAL KNEE ARTHROPLASTY;  Surgeon: Gaynelle Arabian, MD;  [facility];  Service: Orthopedics;  Laterality: Left;    There were no vitals filed for this  visit.  Subjective Assessment - 02/04/19 8527    Subjective  The patient is eager to improve verbal expression skills    Currently in Pain?  No/denies            ADULT SLP TREATMENT - 02/04/19 0001      General Information   Behavior/Cognition  Alert;Cooperative;Pleasant mood;Hard of hearing    HPI  Pt is a 68 y.o. male with history of thrombocythemia, stroke, stent in coronary artery, hyperlidemia, diabetes, CAD. S/P (01/17/2019) ICH (intracerebral hemorrhage) (HCC) L tempoparietal infarct w. petechial hemorrhage and new L parietal hemorrhage into old L MCA infarct      Treatment Provided   Treatment provided  Cognitive-Linquistic      Pain Assessment   Pain Assessment  No/denies pain      Cognitive-Linquistic Treatment   Treatment focused on  Aphasia    Skilled Treatment  AUDITORY COMPREHENSION: The patient completes a 3-unit processing task requiring him to manipulate a set of pictures with 80% accuracy. VERBAL EXPRESSION: In a rapid confrontation naming task, the patient generates names for common objects without extraneous verbal output with 85% accuracy. WORD RETRIEVAL: The patient generates words to fit given concrete categories with 90% accuracy. MOTOR SPEECH: Groping observed throughout session. The patient's performance on a diadochokinetic rates task appear impaired.       Assessment / Recommendations / Plan   Plan  Continue with current plan of care      Progression Toward Goals   Progression toward goals  Progressing toward goals       SLP Education - 02/04/19 0822    Education Details  You are doing well with not providing extraneous verbal output    Person(s) Educated  Patient    Methods  Explanation    Comprehension  Verbalized understanding         SLP Long Term Goals - 01/26/19 1415      SLP LONG TERM GOAL #1   Title  Patient will name common objects, without extraneous/irrelevant circumlocution, with 80% accuracy.    Time  12    Period  Weeks     Status  New    Target Date  04/27/19      SLP LONG TERM GOAL #2   Title  Patient will generate grammatical and cogent sentence to complete simple/concrete linguistic task with 80% accuracy.    Time  12    Period  Weeks    Status  New    Target Date  04/27/19      SLP LONG TERM GOAL #  3   Title  Patient will complete high level word finding tasks with 80% accuracy.    Time  12    Period  Weeks    Status  New    Target Date  04/27/19      SLP LONG TERM GOAL #4   Title  Patient will complete complex auditory attention/vigilance/memory tasks with 80% accuracy.    Time  12    Period  Weeks    Status  New    Target Date  04/27/19      SLP LONG TERM GOAL #5   Title  Patient will write grammatical and cogent sentence to complete simple/concrete linguistic task with 80% accuracy.    Time  12    Period  Weeks    Status  New    Target Date  04/27/19      Additional Long Term Goals   Additional Long Term Goals  Yes      SLP LONG TERM GOAL #6   Title  Patient will demonstrate reading comprehension for paragraphs with 80% accuracy.    Time  12    Period  Weeks    Status  New    Target Date  04/27/19       Plan - 02/04/19 9563    Clinical Impression Statement  The patient continues to present with a mild, fluent aphasia characterized by word-finding difficulties, circumlocutory speech, and mild apraxic behavior.  He expresses his frustrations with verbal expression (word-finding and coordinating oral muscles for verbal output) and is eager to improve. Auditory comprehension does not appear to be an area of significant need for the patient based on his performance in a multi-step auditory processing task. He is encouraged to not provide extraneous verbal output and shows marked improvements for inhibiting circumlocutions in the confrontation naming task. The patient presents with groping behaviors that are consistent with mild apraxia of speech. Plan to address apraxic behavior in the next  session with exercises for motor speech output.    Speech Therapy Frequency  2x / week    Duration  Other (comment)   12 weeks   Treatment/Interventions  Language facilitation;SLP instruction and feedback;Patient/family education    Potential to Achieve Goals  Good    Potential Considerations  Ability to learn/carryover information;Previous level of function;Co-morbidities;Severity of impairments;Cooperation/participation level;Family/community support    Consulted and Agree with Plan of Care  Patient       Patient will benefit from skilled therapeutic intervention in order to improve the following deficits and impairments:   1. Aphasia       Problem List Patient Active Problem List   Diagnosis Date Noted  . 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 CVA (cerebral vascular accident) ([place]) 09/18/2018  . Coronary artery disease involving native coronary artery of native heart without angina pectoris   . H/O right coronary artery stent placement   . Allergic rhinitis 08/19/2018  . Cervical radiculopathy 08/17/2017  . Advanced care planning/counseling discussion 07/21/2017  . Skin lesion of right arm 12/16/2016  . Prostate cancer ([place]) 12/08/2016  . H/O bariatric surgery 07/17/2016  . Essential hypertension 04/24/2015  . Diabetes mellitus associated with hormonal etiology ([place]) 04/24/2015  . Chronic sinusitis 08/13/2009  . Hyperlipidemia 04/02/2009  . Coronary atherosclerosis 04/02/2009  . 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 OSA (obstructive sleep apnea) 04/02/2009  . Arthritis of shoulder region, degenerative 04/02/2009  Thressa Sheller, Graduate Student Clinician 02/04/2019, 8:31 AM  [place] MAIN [facility] SERVICES [address] [place], [name], [address] Phone: [phone]   Fax:  [phone]   Name: Andrew Cobb MRN: [id number] Date of Birth: 1951/06/05

## 2019-02-07 DIAGNOSIS — M25512 Pain in left shoulder: Secondary | ICD-10-CM | POA: Diagnosis not present

## 2019-02-07 DIAGNOSIS — M19112 Post-traumatic osteoarthritis, left shoulder: Secondary | ICD-10-CM | POA: Diagnosis not present

## 2019-02-08 ENCOUNTER — Emergency Department (HOSPITAL_COMMUNITY): Payer: PPO

## 2019-02-08 ENCOUNTER — Encounter: Payer: PPO | Admitting: Occupational Therapy

## 2019-02-08 ENCOUNTER — Other Ambulatory Visit: Payer: Self-pay

## 2019-02-08 ENCOUNTER — Inpatient Hospital Stay (HOSPITAL_COMMUNITY)
Admission: EM | Admit: 2019-02-08 | Discharge: 2019-02-10 | DRG: 066 | Disposition: A | Discharge status: Home / Self Care | Payer: PPO | Attending: Internal Medicine | Admitting: Internal Medicine

## 2019-02-08 ENCOUNTER — Encounter: Payer: PPO | Admitting: Speech Pathology

## 2019-02-08 ENCOUNTER — Encounter (HOSPITAL_COMMUNITY): Payer: Self-pay | Admitting: Internal Medicine

## 2019-02-08 DIAGNOSIS — K219 Gastro-esophageal reflux disease without esophagitis: Secondary | ICD-10-CM | POA: Diagnosis not present

## 2019-02-08 DIAGNOSIS — R200 Anesthesia of skin: Secondary | ICD-10-CM | POA: Diagnosis not present

## 2019-02-08 DIAGNOSIS — D473 Essential (hemorrhagic) thrombocythemia: Secondary | ICD-10-CM | POA: Insufficient documentation

## 2019-02-08 DIAGNOSIS — I639 Cerebral infarction, unspecified: Secondary | ICD-10-CM | POA: Diagnosis not present

## 2019-02-08 DIAGNOSIS — E291 Testicular hypofunction: Secondary | ICD-10-CM | POA: Insufficient documentation

## 2019-02-08 DIAGNOSIS — R29708 NIHSS score 8: Secondary | ICD-10-CM | POA: Diagnosis not present

## 2019-02-08 DIAGNOSIS — Z833 Family history of diabetes mellitus: Secondary | ICD-10-CM

## 2019-02-08 DIAGNOSIS — Z8673 Personal history of transient ischemic attack (TIA), and cerebral infarction without residual deficits: Secondary | ICD-10-CM | POA: Diagnosis not present

## 2019-02-08 DIAGNOSIS — J45909 Unspecified asthma, uncomplicated: Secondary | ICD-10-CM | POA: Insufficient documentation

## 2019-02-08 DIAGNOSIS — E782 Mixed hyperlipidemia: Secondary | ICD-10-CM | POA: Diagnosis not present

## 2019-02-08 DIAGNOSIS — I10 Essential (primary) hypertension: Secondary | ICD-10-CM | POA: Diagnosis present

## 2019-02-08 DIAGNOSIS — Z7984 Long term (current) use of oral hypoglycemic drugs: Secondary | ICD-10-CM

## 2019-02-08 DIAGNOSIS — R55 Syncope and collapse: Secondary | ICD-10-CM

## 2019-02-08 DIAGNOSIS — I63512 Cerebral infarction due to unspecified occlusion or stenosis of left middle cerebral artery: Secondary | ICD-10-CM | POA: Diagnosis not present

## 2019-02-08 DIAGNOSIS — R001 Bradycardia, unspecified: Secondary | ICD-10-CM | POA: Diagnosis present

## 2019-02-08 DIAGNOSIS — I6602 Occlusion and stenosis of left middle cerebral artery: Secondary | ICD-10-CM | POA: Diagnosis not present

## 2019-02-08 DIAGNOSIS — E119 Type 2 diabetes mellitus without complications: Secondary | ICD-10-CM | POA: Insufficient documentation

## 2019-02-08 DIAGNOSIS — Z823 Family history of stroke: Secondary | ICD-10-CM

## 2019-02-08 DIAGNOSIS — E1165 Type 2 diabetes mellitus with hyperglycemia: Secondary | ICD-10-CM | POA: Diagnosis present

## 2019-02-08 DIAGNOSIS — G4730 Sleep apnea, unspecified: Secondary | ICD-10-CM | POA: Insufficient documentation

## 2019-02-08 DIAGNOSIS — R079 Chest pain, unspecified: Secondary | ICD-10-CM | POA: Diagnosis present

## 2019-02-08 DIAGNOSIS — Z96653 Presence of artificial knee joint, bilateral: Secondary | ICD-10-CM | POA: Diagnosis not present

## 2019-02-08 DIAGNOSIS — S80211A Abrasion, right knee, initial encounter: Secondary | ICD-10-CM | POA: Diagnosis present

## 2019-02-08 DIAGNOSIS — G4733 Obstructive sleep apnea (adult) (pediatric): Secondary | ICD-10-CM | POA: Diagnosis present

## 2019-02-08 DIAGNOSIS — C801 Malignant (primary) neoplasm, unspecified: Secondary | ICD-10-CM | POA: Insufficient documentation

## 2019-02-08 DIAGNOSIS — Z981 Arthrodesis status: Secondary | ICD-10-CM | POA: Diagnosis not present

## 2019-02-08 DIAGNOSIS — W19XXXA Unspecified fall, initial encounter: Secondary | ICD-10-CM | POA: Diagnosis present

## 2019-02-08 DIAGNOSIS — Z9884 Bariatric surgery status: Secondary | ICD-10-CM

## 2019-02-08 DIAGNOSIS — I440 Atrioventricular block, first degree: Secondary | ICD-10-CM | POA: Diagnosis present

## 2019-02-08 DIAGNOSIS — I63412 Cerebral infarction due to embolism of left middle cerebral artery: Principal | ICD-10-CM | POA: Diagnosis present

## 2019-02-08 DIAGNOSIS — I2510 Atherosclerotic heart disease of native coronary artery without angina pectoris: Secondary | ICD-10-CM | POA: Diagnosis not present

## 2019-02-08 DIAGNOSIS — D75839 Thrombocytosis, unspecified: Secondary | ICD-10-CM | POA: Insufficient documentation

## 2019-02-08 DIAGNOSIS — Z96611 Presence of right artificial shoulder joint: Secondary | ICD-10-CM | POA: Diagnosis present

## 2019-02-08 DIAGNOSIS — Z8349 Family history of other endocrine, nutritional and metabolic diseases: Secondary | ICD-10-CM

## 2019-02-08 DIAGNOSIS — E1159 Type 2 diabetes mellitus with other circulatory complications: Secondary | ICD-10-CM | POA: Diagnosis present

## 2019-02-08 DIAGNOSIS — E785 Hyperlipidemia, unspecified: Secondary | ICD-10-CM | POA: Diagnosis present

## 2019-02-08 DIAGNOSIS — R29810 Facial weakness: Secondary | ICD-10-CM | POA: Diagnosis present

## 2019-02-08 DIAGNOSIS — S80212A Abrasion, left knee, initial encounter: Secondary | ICD-10-CM | POA: Diagnosis present

## 2019-02-08 DIAGNOSIS — A4902 Methicillin resistant Staphylococcus aureus infection, unspecified site: Secondary | ICD-10-CM | POA: Insufficient documentation

## 2019-02-08 DIAGNOSIS — R202 Paresthesia of skin: Secondary | ICD-10-CM | POA: Diagnosis present

## 2019-02-08 DIAGNOSIS — M1990 Unspecified osteoarthritis, unspecified site: Secondary | ICD-10-CM | POA: Insufficient documentation

## 2019-02-08 DIAGNOSIS — Z955 Presence of coronary angioplasty implant and graft: Secondary | ICD-10-CM | POA: Diagnosis not present

## 2019-02-08 DIAGNOSIS — Z20828 Contact with and (suspected) exposure to other viral communicable diseases: Secondary | ICD-10-CM | POA: Diagnosis present

## 2019-02-08 DIAGNOSIS — Z66 Do not resuscitate: Secondary | ICD-10-CM | POA: Diagnosis present

## 2019-02-08 DIAGNOSIS — R4701 Aphasia: Secondary | ICD-10-CM | POA: Diagnosis present

## 2019-02-08 DIAGNOSIS — I152 Hypertension secondary to endocrine disorders: Secondary | ICD-10-CM | POA: Diagnosis present

## 2019-02-08 DIAGNOSIS — Z87891 Personal history of nicotine dependence: Secondary | ICD-10-CM

## 2019-02-08 DIAGNOSIS — R072 Precordial pain: Secondary | ICD-10-CM | POA: Diagnosis not present

## 2019-02-08 DIAGNOSIS — R471 Dysarthria and anarthria: Secondary | ICD-10-CM | POA: Diagnosis present

## 2019-02-08 DIAGNOSIS — I872 Venous insufficiency (chronic) (peripheral): Secondary | ICD-10-CM | POA: Insufficient documentation

## 2019-02-08 DIAGNOSIS — Z8249 Family history of ischemic heart disease and other diseases of the circulatory system: Secondary | ICD-10-CM

## 2019-02-08 LAB — URINALYSIS, COMPLETE (UACMP) WITH MICROSCOPIC
Bacteria, UA: NONE SEEN
Bilirubin Urine: NEGATIVE
Glucose, UA: NEGATIVE mg/dL
Hgb urine dipstick: NEGATIVE
Ketones, ur: NEGATIVE mg/dL
Leukocytes,Ua: NEGATIVE
Nitrite: NEGATIVE
Protein, ur: NEGATIVE mg/dL
Specific Gravity, Urine: 1.031 — ABNORMAL HIGH (ref 1.005–1.030)
pH: 8 (ref 5.0–8.0)

## 2019-02-08 LAB — DIFFERENTIAL
Abs Immature Granulocytes: 0.01 10*3/uL (ref 0.00–0.07)
Basophils Absolute: 0 10*3/uL (ref 0.0–0.1)
Basophils Relative: 1 %
Eosinophils Absolute: 0.1 10*3/uL (ref 0.0–0.5)
Eosinophils Relative: 3 %
Immature Granulocytes: 0 %
Lymphocytes Relative: 27 %
Lymphs Abs: 1.1 10*3/uL (ref 0.7–4.0)
Monocytes Absolute: 0.3 10*3/uL (ref 0.1–1.0)
Monocytes Relative: 8 %
Neutro Abs: 2.5 10*3/uL (ref 1.7–7.7)
Neutrophils Relative %: 61 %

## 2019-02-08 LAB — COMPREHENSIVE METABOLIC PANEL
ALT: 37 U/L (ref 0–44)
AST: 29 U/L (ref 15–41)
Albumin: 4.1 g/dL (ref 3.5–5.0)
Alkaline Phosphatase: 62 U/L (ref 38–126)
Anion gap: 13 (ref 5–15)
BUN: 20 mg/dL (ref 8–23)
CO2: 23 mmol/L (ref 22–32)
Calcium: 9.2 mg/dL (ref 8.9–10.3)
Chloride: 101 mmol/L (ref 98–111)
Creatinine, Ser: 1.27 mg/dL — ABNORMAL HIGH (ref 0.61–1.24)
GFR calc Af Amer: >60 mL/min (ref >60)
GFR calc non Af Amer: 58 mL/min — ABNORMAL LOW (ref >60)
Glucose, Bld: 321 mg/dL — ABNORMAL HIGH (ref 70–99)
Potassium: 4.3 mmol/L (ref 3.5–5.1)
Sodium: 137 mmol/L (ref 135–145)
Total Bilirubin: 1.1 mg/dL (ref 0.3–1.2)
Total Protein: 6.5 g/dL (ref 6.5–8.1)

## 2019-02-08 LAB — CBC
HCT: 43.8 % (ref 39.0–52.0)
Hemoglobin: 14 g/dL (ref 13.0–17.0)
MCH: 31.4 pg (ref 26.0–34.0)
MCHC: 32 g/dL (ref 30.0–36.0)
MCV: 98.2 fL (ref 80.0–100.0)
Platelets: 144 10*3/uL — ABNORMAL LOW (ref 150–400)
RBC: 4.46 MIL/uL (ref 4.22–5.81)
RDW: 12.4 % (ref 11.5–15.5)
WBC: 4.1 10*3/uL (ref 4.0–10.5)
nRBC: 0 % (ref 0.0–0.2)

## 2019-02-08 LAB — I-STAT CHEM 8, ED
BUN: 24 mg/dL — ABNORMAL HIGH (ref 8–23)
Calcium, Ion: 1.17 mmol/L (ref 1.15–1.40)
Chloride: 99 mmol/L (ref 98–111)
Creatinine, Ser: 1.1 mg/dL (ref 0.61–1.24)
Glucose, Bld: 310 mg/dL — ABNORMAL HIGH (ref 70–99)
HCT: 43 % (ref 39.0–52.0)
Hemoglobin: 14.6 g/dL (ref 13.0–17.0)
Potassium: 4.4 mmol/L (ref 3.5–5.1)
Sodium: 137 mmol/L (ref 135–145)
TCO2: 27 mmol/L (ref 22–32)

## 2019-02-08 LAB — SARS CORONAVIRUS 2 (TAT 6-24 HRS): SARS Coronavirus 2: NEGATIVE

## 2019-02-08 LAB — APTT: aPTT: 27 seconds (ref 24–36)

## 2019-02-08 LAB — GLUCOSE, CAPILLARY
Glucose-Capillary: 102 mg/dL — ABNORMAL HIGH (ref 70–99)
Glucose-Capillary: 151 mg/dL — ABNORMAL HIGH (ref 70–99)

## 2019-02-08 LAB — RAPID URINE DRUG SCREEN, HOSP PERFORMED
Amphetamines: NOT DETECTED
Barbiturates: NOT DETECTED
Benzodiazepines: NOT DETECTED
Cocaine: NOT DETECTED
Opiates: NOT DETECTED
Tetrahydrocannabinol: NOT DETECTED

## 2019-02-08 LAB — PROTIME-INR
INR: 1.1 (ref 0.8–1.2)
Prothrombin Time: 14 seconds (ref 11.4–15.2)

## 2019-02-08 LAB — TROPONIN I (HIGH SENSITIVITY)
Troponin I (High Sensitivity): 6 ng/L (ref <18)
Troponin I (High Sensitivity): 5 ng/L (ref <18)

## 2019-02-08 LAB — CBG MONITORING, ED: Glucose-Capillary: 289 mg/dL — ABNORMAL HIGH (ref 70–99)

## 2019-02-08 MED ORDER — STROKE: EARLY STAGES OF RECOVERY BOOK
Freq: Once | Status: AC
  Administered 2019-02-08: 18:00:00
  Filled 2019-02-08: qty 1

## 2019-02-08 MED ORDER — SODIUM CHLORIDE 0.9 % IV SOLN
INTRAVENOUS | Status: AC
  Administered 2019-02-08 – 2019-02-09 (×2): via INTRAVENOUS

## 2019-02-08 MED ORDER — MAGNESIUM 250 MG PO TABS: 250.0000 mg | ORAL_TABLET | Freq: Every day | ORAL | Status: DC

## 2019-02-08 MED ORDER — IOHEXOL 350 MG/ML SOLN
100.0000 mL | Freq: Once | INTRAVENOUS | Status: AC | PRN
  Administered 2019-02-08: 100 mL via INTRAVENOUS

## 2019-02-08 MED ORDER — LORATADINE 10 MG PO TABS
10.0000 mg | ORAL_TABLET | Freq: Every day | ORAL | Status: DC
  Administered 2019-02-08 – 2019-02-10 (×4): 10 mg via ORAL
  Filled 2019-02-08 (×3): qty 1

## 2019-02-08 MED ORDER — ACETAMINOPHEN 160 MG/5ML PO SOLN: 650.0000 mg | ORAL | Status: DC | PRN

## 2019-02-08 MED ORDER — VITAMIN D 25 MCG (1000 UNIT) PO TABS
2000.0000 [IU] | ORAL_TABLET | Freq: Every day | ORAL | Status: DC
  Administered 2019-02-08 – 2019-02-10 (×3): 2000 [IU] via ORAL
  Filled 2019-02-08 (×3): qty 2

## 2019-02-08 MED ORDER — BENAZEPRIL HCL 40 MG PO TABS
40.0000 mg | ORAL_TABLET | Freq: Every day | ORAL | Status: DC
  Administered 2019-02-09 – 2019-02-10 (×2): 40 mg via ORAL
  Filled 2019-02-08: qty 1
  Filled 2019-02-08: qty 2
  Filled 2019-02-08 (×2): qty 1
  Filled 2019-02-08: qty 2

## 2019-02-08 MED ORDER — ACETAMINOPHEN 650 MG RE SUPP: 650.0000 mg | RECTAL | Status: DC | PRN

## 2019-02-08 MED ORDER — ASPIRIN EC 81 MG PO TBEC: 81.0000 mg | DELAYED_RELEASE_TABLET | Freq: Every day | ORAL | Status: DC

## 2019-02-08 MED ORDER — SENNOSIDES-DOCUSATE SODIUM 8.6-50 MG PO TABS: 1.0000 | ORAL_TABLET | Freq: Every evening | ORAL | Status: DC | PRN

## 2019-02-08 MED ORDER — INSULIN ASPART 100 UNIT/ML ~~LOC~~ SOLN
0.0000 [IU] | Freq: Three times a day (TID) | SUBCUTANEOUS | Status: DC
  Administered 2019-02-09 – 2019-02-10 (×2): 3 [IU] via SUBCUTANEOUS
  Administered 2019-02-10: 07:00:00 2 [IU] via SUBCUTANEOUS

## 2019-02-08 MED ORDER — MAGNESIUM OXIDE 400 (241.3 MG) MG PO TABS
200.0000 mg | ORAL_TABLET | Freq: Every day | ORAL | Status: DC
  Administered 2019-02-08 – 2019-02-09 (×2): 200 mg via ORAL
  Filled 2019-02-08 (×2): qty 1

## 2019-02-08 MED ORDER — VALACYCLOVIR HCL 500 MG PO TABS
500.0000 mg | ORAL_TABLET | Freq: Every day | ORAL | Status: DC
  Administered 2019-02-08 – 2019-02-09 (×2): 500 mg via ORAL
  Filled 2019-02-08 (×2): qty 1

## 2019-02-08 MED ORDER — ACETAMINOPHEN 325 MG PO TABS: 650.0000 mg | ORAL_TABLET | ORAL | Status: DC | PRN

## 2019-02-08 MED ORDER — INSULIN ASPART 100 UNIT/ML ~~LOC~~ SOLN: 0.0000 [IU] | Freq: Every day | SUBCUTANEOUS | Status: DC

## 2019-02-08 MED ORDER — AMITRIPTYLINE HCL 25 MG PO TABS
25.0000 mg | ORAL_TABLET | Freq: Every day | ORAL | Status: DC
  Administered 2019-02-08 – 2019-02-09 (×2): 25 mg via ORAL
  Filled 2019-02-08 (×2): qty 1

## 2019-02-08 MED ORDER — ADULT MULTIVITAMIN W/MINERALS CH
1.0000 | ORAL_TABLET | Freq: Every day | ORAL | Status: DC
  Administered 2019-02-08 – 2019-02-10 (×3): 1 via ORAL
  Filled 2019-02-08 (×3): qty 1

## 2019-02-08 MED ORDER — SODIUM CHLORIDE 0.9% FLUSH
3.0000 mL | Freq: Once | INTRAVENOUS | Status: DC
Start: 2019-02-08 — End: 2019-02-10

## 2019-02-08 MED ORDER — ATORVASTATIN CALCIUM 40 MG PO TABS
40.0000 mg | ORAL_TABLET | Freq: Every day | ORAL | Status: DC
  Administered 2019-02-08: 21:00:00 40 mg via ORAL
  Filled 2019-02-08: qty 1
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
## 2019-02-08 NOTE — Consult Note (Signed)
Neurology Consultation Reason for Consult: Right-sided weakness Referring Physician: Long, J  CC: Right-sided weakness  History is obtained from: Patient  HPI: Andrew Cobb is a 68 y.o. 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  The patient states that he became dizzy, but did not actually pass out.  He then became very weak.  He was helped by bystanders get back to the car where his wife was waiting and she remained to the emergency room where code stroke was activated.  He was taken for emergent head CT which did not demonstrate any hemorrhage, CTA reveals a left M3 occlusion which was patent on the previous exam, but supplies the territory of his previous stroke.  He also did describe some chest pain around the time as well.  LKW: 9 AM tpa given?: no, recent ICH     ROS: A 14 point ROS was performed and is negative except as noted in the HPI.   Past Medical History:  Diagnosis Date  . Arthritis   . Asthma   . Cancer [facility])    prostate  . Chronic venous insufficiency    varicose vein lower extremity with inflammation  . Coronary artery disease 1996   two stents placed   . Diabetes mellitus without complication ([place])    type 2 on metformin  . GERD (gastroesophageal reflux disease)    no issues since gastric bypass surgery as stated per pt  . Hyperlipidemia   . Hypogonadism in male   . MRSA (methicillin resistant Staphylococcus aureus) infection    07/30/2008 thru 08/07/2008  . Sleep apnea    on BIPAP  . Stented coronary artery   . Stroke ([place])   . 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 Cancer Mother        pancreatic  . Diabetes Mother   . Stroke Mother   . Heart disease Mother   . Hyperlipidemia Mother   . Hypertension Mother   . Heart attack Mother    . Heart disease Father   . Stroke Father   . Diabetes Father   . Hypertension Father   . Heart attack Father   . Hyperlipidemia Father   . Pancreatic cancer Father   . Cancer Sister   . Cancer Brother        lung  . Cancer Brother   . Kidney cancer Neg Hx   . Bladder Cancer Neg Hx   . Prostate cancer Neg Hx      Social History:  reports that he quit smoking about 34 years ago. His smoking use included cigarettes. He has a 10.00 pack-year smoking history. He has never used smokeless tobacco. He reports that he does not drink alcohol or use drugs.   Exam: Current vital signs: BP (!) 164/84   Pulse (!) 52   Temp 98.2 F (36.8 C) (Oral)   Resp 12   Ht 5\' 10"  (1.778 m)   Wt 90.7 kg   SpO2 100%   BMI 28.70 kg/m  Vital signs in last 24 hours: Temp:  [98.2 F (36.8 C)] 98.2 F (36.8 C) (08/04 0957) Pulse Rate:  [47-71] 52 (08/04 1521) Resp:  [12-19] 12 (08/04 1521) BP: (111-168)/(72-96) 164/84 (08/04 1521) SpO2:  [95 %-100 %] 100 % (08/04 1521) Weight:  [90.7 kg] 90.7  kg (08/04 1142)   Physical Exam  Constitutional: Appears well-developed and well-nourished.  Psych: Affect appropriate to situation Eyes: No scleral injection HENT: No OP obstrucion Head: Normocephalic.  Cardiovascular: Normal rate and regular rhythm.  Respiratory: Effort normal, non-labored breathing GI: Soft.  No distension. There is no tenderness.  Skin: WDI  Neuro: Mental Status: Patient is awake, alert, oriented to person, place, month, year, and situation. Patient is able to give a clear and coherent history. He has mild expressive >receptive aphasia Cranial Nerves: II: Right field cut pupils are equal, round, and reactive to light.   III,IV, VI: EOMI without ptosis or diploplia.  V: Facial sensation is diminished on the right VII: Facial movement with mild right facial weakness VIII: hearing is intact to voice X: Uvula elevates symmetrically XI: Shoulder shrug is symmetric. XII: tongue  is midline without atrophy or fasciculations.  Motor: Initially he had 3/5 strength of the right arm and leg, but this did improve over the course of my exam. Sensory: Sensation is diminished on the right Cerebellar: No ataxia on the left  I have reviewed labs in epic and the results pertinent to this consultation are: Creatinine 1.27  I have reviewed the images obtained: CT/CTA- likely infarct in the left M3 distribution  Impression: 68 year old male who I suspect reoccluded a vessel supplying a previously infarcted territory causing stroke extension.  The fact that he is improving would support an acute on chronic process.  One other possibility would be that his chest pain cause transient hypotension with focal hypoperfusion in the area of the recent stroke, but given that his M3 was not occluded previously, I suspect that acute occlusion precipitated his symptoms today.  Unfortunately, I think any type of intervention would be much more likely to cause him harm and help.  Recommendations: 1) permissive hypertension 2) I would favor MRI, and if the blood products look like they are resolving and that this is predominantly calcium, then would start antiplatelets 3) PT, OT, ST 4) stroke team to follow   Roland Rack, MD [facility] [phone]  If 7pm- 7am, please page neurology on call as listed in [place].

## 2019-02-08 NOTE — ED Triage Notes (Addendum)
Pt was bought in by wife pov after having a syncopal event around 0830 pt states he suddenly become dizzy & felt numb on his right arm and had a +LOC. Pt has abrasions on both knees from fall. Pt states everything he tried to stand back up he was unable to and had to be placed on a bench by two bystanders. Per hsi wife they helped him into the car. Wife reports worsening on aphasia from previous stroke. New symptoms of numbness, gait difficulty and dizziness. Code stroke called and taken to charge desk to meet MD Long.

## 2019-02-08 NOTE — ED Notes (Signed)
ED TO INPATIENT HANDOFF REPORT  ED Nurse Name and Phone #: Davene Costain [phone]  S Name/Age/Gender Andrew Cobb 68 y.o. male Room/Bed: 020C/020C  Code Status   Code Status: Prior  Home/SNF/Other Home Patient oriented to: self, place, time and situation Is this baseline? Yes   Triage Complete: Triage complete  Chief Complaint stroke like  Triage Note Pt was bought in by wife pov after having a syncopal event around 0830 pt states he suddenly become dizzy & felt numb on his right arm and had a +LOC. Pt has abrasions on both knees from fall. Pt states everything he tried to stand back up he was unable to and had to be placed on a bench by two bystanders. Per hsi wife they helped him into the car. Wife reports worsening on aphasia from previous stroke. New symptoms of numbness, gait difficulty and dizziness. Code stroke called and taken to charge desk to meet MD Long.        Allergies Allergies  Allergen Reactions  . Succinylsulphathiazole Rash  . Sulfamethoxazole-Trimethoprim Rash  . Tetracyclines & Related Rash    Level of Care/Admitting Diagnosis ED Disposition    ED Disposition Condition [facility] Area: [place] [[id number]]  Level of Care: Telemetry Cardiac [103]  I expect the patient will be discharged within 24 hours: No (not a candidate for 5C-Observation unit)  Covid Evaluation: Asymptomatic Screening Protocol (No Symptoms)  Diagnosis: TIA (transient ischemic attack) [403474]  Admitting Physician: Lady Deutscher [[id number]]  Attending Physician: Lady Deutscher [[id number]]  PT Class (Do Not Modify): Observation [104]  PT Acc Code (Do Not Modify): Observation [10022]       B Medical/Surgery History Past Medical History:  Diagnosis Date  . Arthritis   . Asthma   . Cancer [facility])    prostate  . Chronic venous insufficiency    varicose vein lower extremity with inflammation  . Coronary artery disease 1996   two stents placed   .  Diabetes mellitus without complication ([place])    type 2 on metformin  . GERD (gastroesophageal reflux disease)    no issues since gastric bypass surgery as stated per pt  . Hyperlipidemia   . Hypogonadism in male   . MRSA (methicillin resistant Staphylococcus aureus) infection    07/30/2008 thru 08/07/2008  . Sleep apnea    on BIPAP  . Stented coronary artery   . Stroke ([place])   . Thrombocythemia [facility])    Past Surgical History:  Procedure Laterality Date  . ANGIOPLASTY    . ANGIOPLASTY     with stent 04/07/1995  . ANTERIOR CERVICAL DECOMPRESSION/DISCECTOMY FUSION 4 LEVELS N/A 08/17/2017   Procedure: Anterior discectomy with fusion and plate fixation Cervical Three-Four, Four-Five, Five-Six, and Six-Seven Fusion;  Surgeon: Ditty, Kevan Ny, MD;  Location: [place];  Service: Neurosurgery;  Laterality: N/A;  Anterior discectomy with fusion and plate fixation Cervical Three-Four, Four-Five, Five-Six, and Six-Seven Fusion   . APPENDECTOMY     1966  . BUBBLE STUDY  09/20/2018   Procedure: BUBBLE STUDY;  Surgeon: Josue Hector, MD;  Location: [place];  Service: Cardiovascular;;  . [place]  . CARDIOVASCULAR STRESS TEST     07/31/2011  . CARPAL TUNNEL RELEASE Left   . CHOLECYSTECTOMY     2006  . colonscopy      08/25/2012  . EYE SURGERY Bilateral    cataract  . FUNCTIONAL ENDOSCOPIC SINUS SURGERY  11/10/2013  . GASTRIC BYPASS     10/05/2012  . HERNIA REPAIR     left inguinal 1981  . INCISION AND DRAINAGE ABSCESS Right 02/26/2017   Procedure: INCISION AND DRAINAGE ABSCESS;  Surgeon: Nickie Retort, MD;  Location: [facility] ORS;  Service: Urology;  Laterality: Right;  . JOINT REPLACEMENT     bilateral  . left ankle surgery      05/03/2003   . left carpel tunnel      09/18/1993  . left knee meniscal tear      01/25/2010  . left knee meniscal tear repair      05/04/1996  . left rotator cuff repair      05/03/2003   . LOOP RECORDER INSERTION N/A 09/20/2018    Procedure: LOOP RECORDER INSERTION;  Surgeon: Evans Lance, MD;  Location: [place] CV LAB;  Service: Cardiovascular;  Laterality: N/A;  . REPLACEMENT TOTAL KNEE BILATERAL  07/13/2015  . right ankle surgery      fracture has 2 screws 07/07/1997  . right carpel tunnel      05/16/1992  . right shoulder replacement      01/27/2006  . SCROTAL EXPLORATION Right 02/26/2017   Procedure: SCROTUM EXPLORATION;  Surgeon: Nickie Retort, MD;  Location: [facility] ORS;  Service: Urology;  Laterality: Right;  . TEE WITHOUT CARDIOVERSION N/A 09/20/2018   Procedure: TRANSESOPHAGEAL ECHOCARDIOGRAM (TEE);  Surgeon: Josue Hector, MD;  Location: [facility] ENDOSCOPY;  Service: Cardiovascular;  Laterality: N/A;  . TOTAL KNEE ARTHROPLASTY Left 07/13/2015   Procedure: LEFT TOTAL KNEE ARTHROPLASTY;  Surgeon: Gaynelle Arabian, MD;  Location: [facility];  Service: Orthopedics;  Laterality: Left;     A IV Location/Drains/Wounds Patient Lines/Drains/Airways Status   Active Line/Drains/Airways    Name:   Placement date:   Placement time:   Site:   Days:   Peripheral IV 01/17/19 Right Antecubital   01/17/19    1500    Antecubital   22   Peripheral IV 01/17/19 Right;Posterior Forearm   01/17/19    1715    Forearm   22   Peripheral IV 02/08/19 Left Antecubital   02/08/19    1009    Antecubital   less than 1   External Urinary Catheter   01/17/19    1852    -   22          Intake/Output Last 24 hours No intake or output data in the 24 hours ending 02/08/19 1449  Labs/Imaging Results for orders placed or performed during the hospital encounter of 02/08/19 (from the past 48 hour(s))  Protime-INR     Status: None   Collection Time: 02/08/19 10:00 AM  Result Value Ref Range   Prothrombin Time 14.0 11.4 - 15.2 seconds   INR 1.1 0.8 - 1.2    Comment: (NOTE) INR goal varies based on device and disease states. Performed at [name], [place] [address], [place], [place] [address]   APTT     Status: None   Collection  Time: 02/08/19 10:00 AM  Result Value Ref Range   aPTT 27 24 - 36 seconds    Comment: Performed at [facility] [address] [place] [address]  CBC     Status: Abnormal   Collection Time: 02/08/19 10:00 AM  Result Value Ref Range   WBC 4.1 4.0 - 10.5 K/uL   RBC 4.46 4.22 - 5.81 MIL/uL   Hemoglobin 14.0 13.0 - 17.0 g/dL  HCT 43.8 39.0 - 52.0 %   MCV 98.2 80.0 - 100.0 fL   MCH 31.4 26.0 - 34.0 pg   MCHC 32.0 30.0 - 36.0 g/dL   RDW 12.4 11.5 - 15.5 %   Platelets 144 (L) 150 - 400 K/uL   nRBC 0.0 0.0 - 0.2 %    Comment: Performed at [facility] [address] [place], [place] [address]  Differential     Status: None   Collection Time: 02/08/19 10:00 AM  Result Value Ref Range   Neutrophils Relative % 61 %   Neutro Abs 2.5 1.7 - 7.7 K/uL   Lymphocytes Relative 27 %   Lymphs Abs 1.1 0.7 - 4.0 K/uL   Monocytes Relative 8 %   Monocytes Absolute 0.3 0.1 - 1.0 K/uL   Eosinophils Relative 3 %   Eosinophils Absolute 0.1 0.0 - 0.5 K/uL   Basophils Relative 1 %   Basophils Absolute 0.0 0.0 - 0.1 K/uL   Immature Granulocytes 0 %   Abs Immature Granulocytes 0.01 0.00 - 0.07 K/uL    Comment: Performed at [facility] [address] [place], [place] [address]  Comprehensive metabolic panel     Status: Abnormal   Collection Time: 02/08/19 10:00 AM  Result Value Ref Range   Sodium 137 135 - 145 mmol/L   Potassium 4.3 3.5 - 5.1 mmol/L   Chloride 101 98 - 111 mmol/L   CO2 23 22 - 32 mmol/L   Glucose, Bld 321 (H) 70 - 99 mg/dL   BUN 20 8 - 23 mg/dL   Creatinine, Ser 1.27 (H) 0.61 - 1.24 mg/dL   Calcium 9.2 8.9 - 10.3 mg/dL   Total Protein 6.5 6.5 - 8.1 g/dL   Albumin 4.1 3.5 - 5.0 g/dL   AST 29 15 - 41 U/L   ALT 37 0 - 44 U/L   Alkaline Phosphatase 62 38 - 126 U/L   Total Bilirubin 1.1 0.3 - 1.2 mg/dL   GFR calc non Af Amer 58 (L) >60 mL/min   GFR calc Af Amer >60 >60 mL/min   Anion gap 13 5 - 15    Comment: Performed at [facility] [address], [place], [place] [address]  CBG monitoring, ED     Status: Abnormal   Collection Time: 02/08/19 10:07 AM  Result Value Ref Range   Glucose-Capillary 289 (H) 70 - 99 mg/dL  I-stat chem 8, ED     Status: Abnormal   Collection Time: 02/08/19 10:13 AM  Result Value Ref Range   Sodium 137 135 - 145 mmol/L   Potassium 4.4 3.5 - 5.1 mmol/L   Chloride 99 98 - 111 mmol/L   BUN 24 (H) 8 - 23 mg/dL   Creatinine, Ser 1.10 0.61 - 1.24 mg/dL   Glucose, Bld 310 (H) 70 - 99 mg/dL   Calcium, Ion 1.17 1.15 - 1.40 mmol/L   TCO2 27 22 - 32 mmol/L   Hemoglobin 14.6 13.0 - 17.0 g/dL   HCT 43.0 39.0 - 52.0 %  Troponin I (High Sensitivity)     Status: None   Collection Time: 02/08/19 10:17 AM  Result Value Ref Range   Troponin I (High Sensitivity) 6 <18 ng/L    Comment: (NOTE) Elevated high sensitivity troponin I (hsTnI) values and significant  changes across serial measurements may suggest ACS but many other  chronic and acute conditions are known to elevate hsTnI results.  Refer to the "Links"  section for chest pain algorithms and additional  guidance. Performed at [facility], [place] [address], [place], [place] [address]   Troponin I (High Sensitivity)     Status: None   Collection Time: 02/08/19 12:17 PM  Result Value Ref Range   Troponin I (High Sensitivity) 5 <18 ng/L    Comment: (NOTE) Elevated high sensitivity troponin I (hsTnI) values and significant  changes across serial measurements may suggest ACS but many other  chronic and acute conditions are known to elevate hsTnI results.  Refer to the "Links" section for chest pain algorithms and additional  guidance. Performed at [facility], [place] [address], [place], [name] [address]    Ct Angio Head W Or Wo Contrast  Result Date: 02/08/2019 CLINICAL DATA:  Weakness. EXAM: CT ANGIOGRAPHY HEAD AND NECK CT PERFUSION BRAIN TECHNIQUE: Multidetector CT imaging of the head and neck was performed using the standard protocol  during bolus administration of intravenous contrast. Multiplanar CT image reconstructions and MIPs were obtained to evaluate the vascular anatomy. Carotid stenosis measurements (when applicable) are obtained utilizing NASCET criteria, using the distal internal carotid diameter as the denominator. Multiphase CT imaging of the brain was performed following IV bolus contrast injection. Subsequent parametric perfusion maps were calculated using RAPID software. CONTRAST:  Reference EMR COMPARISON:  01/18/2019 CTA head neck. FINDINGS: CTA NECK FINDINGS Aortic arch: 2 vessel branching. Right carotid system: Atherosclerotic plaque along the common carotid and proximal ICA without flow limiting stenosis or ulceration in the proximal ICA. There is ECA origin stenosis. Left carotid system: Moderate mixed density plaque at the bifurcation without flow limiting stenosis or ulceration. Vertebral arteries: Proximal subclavian atherosclerosis without ulceration or flow limiting stenosis. Negative for stenosis, beading, or ulceration. 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 No acute or aggressive finding Other neck: No significant incidental finding Upper chest: Negative Review of the MIP images confirms the above findings CTA HEAD FINDINGS Anterior circulation: Left M3 branch occlusion, new from prior but correlating with the known left cerebral infarcts. No large vessel occlusion. There is mild atherosclerotic plaque on the carotid siphons. Negative for aneurysm. Posterior circulation: The vertebral and basilar arteries are smooth and widely patent. No branch occlusion or flow limiting stenosis. Venous sinuses: Patent Anatomic variants: Negative Review of the MIP images confirms the above findings CT Brain Perfusion Findings: CBF (<30%) Volume: 76mL Perfusion (Tmax>6.0s) volume: 22mL Mismatch Volume: 62mL These results were called by telephone at the time of interpretation on 02/08/2019 at 10:47 am to Dr.  Roland Rack , who verbally acknowledged these results. IMPRESSION: 1. Left M3 occlusion that is new from 01/18/2019 CTA but correlates with the now chronic and subacute infarct seen on the July 2020 imaging. Cerebral perfusion suggests peri-infarct penumbra measuring 19 cc. 2. Atherosclerosis without flow limiting stenosis or evident embolic source. Electronically Signed   By: Monte Fantasia M.D.   On: 02/08/2019 10:50   Ct Angio Neck W Or Wo Contrast  Result Date: 02/08/2019 CLINICAL DATA:  Weakness. EXAM: CT ANGIOGRAPHY HEAD AND NECK CT PERFUSION BRAIN TECHNIQUE: Multidetector CT imaging of the head and neck was performed using the standard protocol during bolus administration of intravenous contrast. Multiplanar CT image reconstructions and MIPs were obtained to evaluate the vascular anatomy. Carotid stenosis measurements (when applicable) are obtained utilizing NASCET criteria, using the distal internal carotid diameter as the denominator. Multiphase CT imaging of the brain was performed following IV bolus contrast injection. Subsequent parametric perfusion maps were calculated using  RAPID software. CONTRAST:  Reference EMR COMPARISON:  01/18/2019 CTA head neck. FINDINGS: CTA NECK FINDINGS Aortic arch: 2 vessel branching. Right carotid system: Atherosclerotic plaque along the common carotid and proximal ICA without flow limiting stenosis or ulceration in the proximal ICA. There is ECA origin stenosis. Left carotid system: Moderate mixed density plaque at the bifurcation without flow limiting stenosis or ulceration. Vertebral arteries: Proximal subclavian atherosclerosis without ulceration or flow limiting stenosis. Negative for stenosis, beading, or ulceration. 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 No acute or aggressive finding Other neck: No significant incidental finding Upper chest: Negative Review of the MIP images confirms the above findings CTA HEAD FINDINGS Anterior  circulation: Left M3 branch occlusion, new from prior but correlating with the known left cerebral infarcts. No large vessel occlusion. There is mild atherosclerotic plaque on the carotid siphons. Negative for aneurysm. Posterior circulation: The vertebral and basilar arteries are smooth and widely patent. No branch occlusion or flow limiting stenosis. Venous sinuses: Patent Anatomic variants: Negative Review of the MIP images confirms the above findings CT Brain Perfusion Findings: CBF (<30%) Volume: 46mL Perfusion (Tmax>6.0s) volume: 39mL Mismatch Volume: 74mL These results were called by telephone at the time of interpretation on 02/08/2019 at 10:47 am to Dr. Roland Rack , who verbally acknowledged these results. IMPRESSION: 1. Left M3 occlusion that is new from 01/18/2019 CTA but correlates with the now chronic and subacute infarct seen on the July 2020 imaging. Cerebral perfusion suggests peri-infarct penumbra measuring 19 cc. 2. Atherosclerosis without flow limiting stenosis or evident embolic source. Electronically Signed   By: Monte Fantasia M.D.   On: 02/08/2019 10:50   Ct Cerebral Perfusion W Contrast  Result Date: 02/08/2019 CLINICAL DATA:  Weakness. EXAM: CT ANGIOGRAPHY HEAD AND NECK CT PERFUSION BRAIN TECHNIQUE: Multidetector CT imaging of the head and neck was performed using the standard protocol during bolus administration of intravenous contrast. Multiplanar CT image reconstructions and MIPs were obtained to evaluate the vascular anatomy. Carotid stenosis measurements (when applicable) are obtained utilizing NASCET criteria, using the distal internal carotid diameter as the denominator. Multiphase CT imaging of the brain was performed following IV bolus contrast injection. Subsequent parametric perfusion maps were calculated using RAPID software. CONTRAST:  Reference EMR COMPARISON:  01/18/2019 CTA head neck. FINDINGS: CTA NECK FINDINGS Aortic arch: 2 vessel branching. Right carotid  system: Atherosclerotic plaque along the common carotid and proximal ICA without flow limiting stenosis or ulceration in the proximal ICA. There is ECA origin stenosis. Left carotid system: Moderate mixed density plaque at the bifurcation without flow limiting stenosis or ulceration. Vertebral arteries: Proximal subclavian atherosclerosis without ulceration or flow limiting stenosis. Negative for stenosis, beading, or ulceration. 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 No acute or aggressive finding Other neck: No significant incidental finding Upper chest: Negative Review of the MIP images confirms the above findings CTA HEAD FINDINGS Anterior circulation: Left M3 branch occlusion, new from prior but correlating with the known left cerebral infarcts. No large vessel occlusion. There is mild atherosclerotic plaque on the carotid siphons. Negative for aneurysm. Posterior circulation: The vertebral and basilar arteries are smooth and widely patent. No branch occlusion or flow limiting stenosis. Venous sinuses: Patent Anatomic variants: Negative Review of the MIP images confirms the above findings CT Brain Perfusion Findings: CBF (<30%) Volume: 93mL Perfusion (Tmax>6.0s) volume: 29mL Mismatch Volume: 46mL These results were called by telephone at the time of interpretation on 02/08/2019 at 10:47 am to Dr. Roland Rack , who verbally  acknowledged these results. IMPRESSION: 1. Left M3 occlusion that is new from 01/18/2019 CTA but correlates with the now chronic and subacute infarct seen on the July 2020 imaging. Cerebral perfusion suggests peri-infarct penumbra measuring 19 cc. 2. Atherosclerosis without flow limiting stenosis or evident embolic source. Electronically Signed   By: Monte Fantasia M.D.   On: 02/08/2019 10:50   Dg Chest Portable 1 View  Result Date: 02/08/2019 CLINICAL DATA:  68 year old male status post syncope at 0830 hours. EXAM: PORTABLE CHEST 1 VIEW COMPARISON:   01/17/2019 and earlier. FINDINGS: Portable AP semi upright view at 1026 hours. Stable left chest cardiac event recorder. Lung volumes and mediastinal contours are stable and within normal limits. Visualized tracheal air column is within normal limits. Mild scarring at the left lung base suspected, otherwise Allowing for portable technique the lungs are clear. Postoperative changes to both shoulders in the cervical spine. IMPRESSION: No acute cardiopulmonary abnormality. Electronically Signed   By: Genevie Ann M.D.   On: 02/08/2019 10:51   Ct Head Code Stroke Wo Contrast  Result Date: 02/08/2019 CLINICAL DATA:  Code stroke.  Flaccid left side EXAM: CT HEAD WITHOUT CONTRAST TECHNIQUE: Contiguous axial images were obtained from the base of the skull through the vertex without intravenous contrast. COMPARISON:  01/17/2019 FINDINGS: Brain: Cytotoxic edema in the left insula that is new from prior. 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 Given the interval this is presumably at least partially from mineralization. Small remote anterior and high left frontal cortex infarct. Extensive small vessel ischemic gliosis in the cerebral white matter. Vascular: Hyperdense left M3 branch on coronal reformats, chronic. Skull: Negative Sinuses/Orbits: Negative Other: These results were called by telephone at the time of interpretation on 02/08/2019 at 10:23 am to Dr. Leonel Ramsay , who verbally acknowledged these results. ASPECTS [facility] Stroke Program Early CT Score) Not scored given the infarcts of various ages. IMPRESSION: 1. Small acute infarct in the left insula. 2. 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 Given the interval there is likely also developing mineralization. 3. Advanced chronic small vessel ischemia. Electronically Signed   By: Monte Fantasia  M.D.   On: 02/08/2019 10:25    Pending Labs Unresulted Labs (From admission, onward)    Start     Ordered   02/08/19 1017  SARS CORONAVIRUS 2 Nasal Swab Aptima Multi Swab  (Asymptomatic Patients Labs)  Once,   STAT    Question Answer Comment  Is this test for diagnosis or screening Screening   Symptomatic for COVID-19 as defined by CDC No   Hospitalized for COVID-19 No   Admitted to ICU for COVID-19 No   Previously tested for COVID-19 Yes   Resident in a congregate (group) care setting No   Employed in healthcare setting No      02/08/19 1017          Vitals/Pain Today's Vitals   02/08/19 1230 02/08/19 1259 02/08/19 1328 02/08/19 1345  BP: 137/87  (!) 156/84 (!) 160/91  Pulse: (!) 55  (!) 51 (!) 53  Resp: 12  14 17   Temp:      TempSrc:      SpO2: 100%  100% 100%  Weight:      Height:      PainSc:  0-No pain      Isolation Precautions No active isolations  Medications Medications  sodium chloride flush (  NS) 0.9 % injection 3 mL (has no administration in time range)  iohexol (OMNIPAQUE) 350 MG/ML injection 100 mL (100 mLs Intravenous Contrast Given 02/08/19 1037)    Mobility walks     Focused Assessments Neuro Assessment Handoff:  Swallow screen pass? Yes  Cardiac Rhythm: Normal sinus rhythm NIH Stroke Scale ( + Modified Stroke Scale Criteria)  Interval: Initial Level of Consciousness (1a.)   : Alert, keenly responsive LOC Questions (1b. )   +: Answers both questions correctly LOC Commands (1c. )   + : Performs both tasks correctly Best Gaze (2. )  +: Normal Visual (3. )  +: No visual loss Facial Palsy (4. )    : Normal symmetrical movements Motor Arm, Left (5a. )   +: No drift Motor Arm, Right (5b. )   +: No drift Motor Leg, Left (6a. )   +: No drift Motor Leg, Right (6b. )   +: No drift Limb Ataxia (7. ): Absent Sensory (8. )   +: Normal, no sensory loss Best Language (9. )   +: Mild-to-moderate aphasia Dysarthria (10. ): Mild-to-moderate dysarthria,  patient slurs at least some words and, at worst, can be understood with some difficulty Extinction/Inattention (11.)   +: No Abnormality Modified SS Total  +: 1 Complete NIHSS TOTAL: 8 Last date known well: 02/08/19 Last time known well: 0830 Neuro Assessment:   Neuro Checks:   Initial (02/08/19 1000)  Last Documented NIHSS Modified Score: 1 (02/08/19 1259) Has TPA been given? No If patient is a Neuro Trauma and patient is going to OR before floor call report to [place] nurse: [phone] or [phone]     R Recommendations: See Admitting Provider Note  Report given to:   Additional Notes:

## 2019-02-08 NOTE — Progress Notes (Signed)
Patient from ED, to bed, bed in lowest position, with bed light on, call light in reach. Assessment, NIH cpmpleted, MD called r/t patients HR dropping to 39, both knees cleaned and dressed

## 2019-02-08 NOTE — H&P (Signed)
History and Physical    Andrew Cobb IRC:[id number] DOB: Jan 18, 1951 DOA: 02/08/2019  PCP: Guadalupe Maple, MD  Patient coming from: [place] the park  I have personally briefly reviewed patient's old medical records in [place]  Chief Complaint: Loss of consciousness and dizziness  HPI: Andrew Cobb is a 68 y.o. male with medical history significant of recent hemorrhagic stroke, hyperlipidemia, diabetes type 2, gastroesophageal reflux disease, and asthma who came to the emergency department today following a syncopal episode with associated new onset of left arm numbness and a speech change.  July the patient had a intracerebral hemorrhage was discharged from the hospital on July 15 with only mild speech deficits.  Today he was in [place] walking in the park he suddenly felt pain in his chest and experienced a syncopal event.  He fell to the ground was helped up by bystanders and his wife was contacted.  In the emergency department she said that his speech deficits are much worse than prior and left arm numbness is new.  At the present time the patient denies any chest pain, shortness of breath, seizure activity was not noted by bystanders on the scene.  A code stroke was activated. CTA of head and neck were obtained and showed an acute M3 segment narrowing that was not present 3 weeks ago.  It is very distal and no intervention is indicated.  Patient was seen by Dr. Kathrynn Speed of neurology who recommended admission for acute extension of prior stroke. ED Course: Heart rate stable and patient with out much symptomatology.  Speech is very dysarthric.  Patient noted to have a loop recorder.  I reviewed his history of his loop recorder's and no events were noted.  He was placed for evaluation of possible atrial fibrillation.  No episodes of atrial fibrillation were noted.  Earlier in July he had some episodes of pauses but adjustments were made to the recorder and these  resolved.  Addendum: Once the patient reached the floor I received a call from his nurse that his heart rate was in the 30s to 40s persistently.  I personally called cardiology and consulted them for further evaluation as the patient is not taking any occasions that would lower his heart rate.  Review of Systems: As per HPI otherwise all other systems reviewed and  negative.  Past Medical History:  Diagnosis Date  . Arthritis   . Asthma   . Cancer [facility])    prostate  . Chronic venous insufficiency    varicose vein lower extremity with inflammation  . Coronary artery disease 1996   two stents placed   . Diabetes mellitus without complication ([place])    type 2 on metformin  . GERD (gastroesophageal reflux disease)    no issues since gastric bypass surgery as stated per pt  . Hyperlipidemia   . Hypogonadism in male   . MRSA (methicillin resistant Staphylococcus aureus) infection    07/30/2008 thru 08/07/2008  . Sleep apnea    on BIPAP  . Stented coronary artery   . Stroke ([place])   . Thrombocythemia [facility])     Past Surgical History:  Procedure Laterality Date  . ANGIOPLASTY    . ANGIOPLASTY     with stent 04/07/1995  . ANTERIOR CERVICAL DECOMPRESSION/DISCECTOMY FUSION 4 LEVELS N/A 08/17/2017   Procedure: Anterior discectomy with fusion and plate fixation Cervical Three-Four, Four-Five, Five-Six, and Six-Seven Fusion;  Surgeon: Ditty, Kevan Ny, MD;  Location: [place];  Service: Neurosurgery;  Laterality: N/A;  Anterior discectomy with fusion and plate fixation Cervical Three-Four, Four-Five, Five-Six, and Six-Seven Fusion   . APPENDECTOMY     1966  . BUBBLE STUDY  09/20/2018   Procedure: BUBBLE STUDY;  Surgeon: Josue Hector, MD;  Location: [place];  Service: Cardiovascular;;  . [place]  . CARDIOVASCULAR STRESS TEST     07/31/2011  . CARPAL TUNNEL RELEASE Left   . CHOLECYSTECTOMY     2006  . colonscopy      08/25/2012  . EYE SURGERY Bilateral     cataract  . FUNCTIONAL ENDOSCOPIC SINUS SURGERY     11/10/2013  . GASTRIC BYPASS     10/05/2012  . HERNIA REPAIR     left inguinal 1981  . INCISION AND DRAINAGE ABSCESS Right 02/26/2017   Procedure: INCISION AND DRAINAGE ABSCESS;  Surgeon: Nickie Retort, MD;  Location: [facility] ORS;  Service: Urology;  Laterality: Right;  . JOINT REPLACEMENT     bilateral  . left ankle surgery      05/03/2003   . left carpel tunnel      09/18/1993  . left knee meniscal tear      01/25/2010  . left knee meniscal tear repair      05/04/1996  . left rotator cuff repair      05/03/2003   . LOOP RECORDER INSERTION N/A 09/20/2018   Procedure: LOOP RECORDER INSERTION;  Surgeon: Evans Lance, MD;  Location: [place] CV LAB;  Service: Cardiovascular;  Laterality: N/A;  . REPLACEMENT TOTAL KNEE BILATERAL  07/13/2015  . right ankle surgery      fracture has 2 screws 07/07/1997  . right carpel tunnel      05/16/1992  . right shoulder replacement      01/27/2006  . SCROTAL EXPLORATION Right 02/26/2017   Procedure: SCROTUM EXPLORATION;  Surgeon: Nickie Retort, MD;  Location: [facility];  Service: Urology;  Laterality: Right;  . TEE WITHOUT CARDIOVERSION N/A 09/20/2018   Procedure: TRANSESOPHAGEAL ECHOCARDIOGRAM (TEE);  Surgeon: Josue Hector, MD;  Location: [facility];  Service: Cardiovascular;  Laterality: N/A;  . TOTAL KNEE ARTHROPLASTY Left 07/13/2015   Procedure: LEFT TOTAL KNEE ARTHROPLASTY;  Surgeon: Gaynelle Arabian, MD;  Location: [facility];  Service: Orthopedics;  Laterality: Left;    Social History   Social History Narrative  . Not on file     reports that he quit smoking about 34 years ago. His smoking use included cigarettes. He has a 10.00 pack-year smoking history. He has never used smokeless tobacco. He reports that he does not drink alcohol or use drugs.  Allergies  Allergen Reactions  . Succinylsulphathiazole Rash  . Sulfamethoxazole-Trimethoprim Rash  . Tetracyclines & Related Rash     Family History  Problem Relation Age of Onset  . Cancer Mother        pancreatic  . Diabetes Mother   . Stroke Mother   . Heart disease Mother   . Hyperlipidemia Mother   . Hypertension Mother   . Heart attack Mother   . Heart disease Father   . Stroke Father   . Diabetes Father   . Hypertension Father   . Heart attack Father   . Hyperlipidemia Father   . Pancreatic cancer Father   . Cancer Sister   . Cancer Brother        lung  . Cancer Brother   . Kidney cancer Neg Hx   . Bladder Cancer Neg Hx   .  Prostate cancer Neg Hx      Prior to Admission medications   Medication Sig Start Date End Date Taking? Authorizing Provider  amitriptyline (ELAVIL) 25 MG tablet Take 1 tablet (25 mg total) by mouth at bedtime. 10/31/18  Yes Crissman, Jeannette How, MD  atorvastatin (LIPITOR) 40 MG tablet Take 40 mg by mouth at bedtime.    Yes [provider]  benazepril (LOTENSIN) 40 MG tablet Take 0.5 tablets (20 mg total) by mouth daily. Patient taking differently: Take 40 mg by mouth daily.  01/19/19  Yes Donzetta Starch, NP  cetirizine (ZYRTEC) 10 MG tablet Take 10 mg by mouth daily.   Yes [provider]  Cholecalciferol (VITAMIN D) 2000 units CAPS Take 2,000 Units by mouth daily.   Yes [provider]  Magnesium 250 MG TABS Take 250 mg by mouth at bedtime.    Yes [provider]  metFORMIN (GLUCOPHAGE) 500 MG tablet Take 1 tablet (500 mg total) by mouth 2 (two) times daily with a meal. 01/22/19  Yes Donzetta Starch, NP  Multiple Vitamins-Minerals (MULTIVITAMIN PO) Take 1 tablet by mouth daily.    Yes [provider]  valACYclovir (VALTREX) 500 MG tablet Take 1 tablet (500 mg total) by mouth daily. Patient taking differently: Take 500 mg by mouth at bedtime.  10/18/18  Yes Crissman, Jeannette How, MD  glucose blood test strip 1 each by Other route 2 (two) times daily. DX E11.9 12/30/18   Johnson, Megan P, DO  Lancets [facility] ULTRASOFT) lancets 1 each by Other  route 2 (two) times daily. Dx E11.9 04/24/15   Arlis Porta., MD    Physical Exam:  Constitutional: NAD, calm, comfortable Vitals:   02/08/19 1400 02/08/19 1500 02/08/19 1521 02/08/19 1610  BP: (!) 168/87  (!) 164/84 (!) 153/73  Pulse: (!) 47 (!) 54 (!) 52   Resp: 18 15 12 14   Temp:      TempSrc:      SpO2: 98% 98% 100% 100%  Weight:      Height:       Eyes: PERRL, lids and conjunctivae normal ENMT: Mucous membranes are moist. Posterior pharynx clear of any exudate or lesions.Normal dentition.  Neck: normal, supple, no masses, no thyromegaly Respiratory: clear to auscultation bilaterally, no wheezing, no crackles. Normal respiratory effort. No accessory muscle use.  Cardiovascular: Regular rate and rhythm, no murmurs / rubs / gallops. No extremity edema. 2+ pedal pulses. No carotid bruits.  Discomfort at loop recorder insertion site Abdomen: no tenderness, no masses palpated. No hepatosplenomegaly. Bowel sounds positive.  Musculoskeletal: no clubbing / cyanosis. No joint deformity upper and lower extremities. Good ROM, no contractures. Normal muscle tone.  Skin: no rashes, lesions, ulcers. No induration Neurologic: CN 2-12 grossly intact. Sensation intact, DTR normal. Strength 5/5 in all 4.  Speech is very dysarthric Psychiatric: Normal judgment and insight. Alert and oriented x 3. Normal mood.    Labs on Admission: I have personally reviewed following labs and imaging studies  CBC: Recent Labs  Lab 02/08/19 1000 02/08/19 1013  WBC 4.1  --   NEUTROABS 2.5  --   HGB 14.0 14.6  HCT 43.8 43.0  MCV 98.2  --   PLT 144*  --    Basic Metabolic Panel: Recent Labs  Lab 02/08/19 1000 02/08/19 1013  NA 137 137  K 4.3 4.4  CL 101 99  CO2 23  --   GLUCOSE 321* 310*  BUN 20 24*  CREATININE 1.27*  1.10  CALCIUM 9.2  --    GFR: Estimated Creatinine Clearance: 72.8 mL/min (by C-G formula based on SCr of 1.1 mg/dL). Liver Function Tests: Recent Labs  Lab  02/08/19 1000  AST 29  ALT 37  ALKPHOS 62  BILITOT 1.1  PROT 6.5  ALBUMIN 4.1   Coagulation Profile: Recent Labs  Lab 02/08/19 1000  INR 1.1   CBG: Recent Labs  Lab 02/08/19 1007  GLUCAP 289*   Urine analysis:    Component Value Date/Time   COLORURINE COLORLESS (A) 01/17/2019 1647   APPEARANCEUR CLEAR 01/17/2019 1647   APPEARANCEUR Clear 07/21/2017 0843   LABSPEC 1.004 (L) 01/17/2019 1647   PHURINE 6.0 01/17/2019 1647   GLUCOSEU NEGATIVE 01/17/2019 1647   HGBUR NEGATIVE 01/17/2019 1647   BILIRUBINUR NEGATIVE 01/17/2019 1647   BILIRUBINUR Negative 07/21/2017 0843   KETONESUR NEGATIVE 01/17/2019 1647   PROTEINUR NEGATIVE 01/17/2019 1647   UROBILINOGEN 0.2 07/11/2012 1443   NITRITE NEGATIVE 01/17/2019 1647   LEUKOCYTESUR NEGATIVE 01/17/2019 1647    Radiological Exams on Admission: Ct Angio Head W Or Wo Contrast  Result Date: 02/08/2019 CLINICAL DATA:  Weakness. EXAM: CT ANGIOGRAPHY HEAD AND NECK CT PERFUSION BRAIN TECHNIQUE: Multidetector CT imaging of the head and neck was performed using the standard protocol during bolus administration of intravenous contrast. Multiplanar CT image reconstructions and MIPs were obtained to evaluate the vascular anatomy. Carotid stenosis measurements (when applicable) are obtained utilizing NASCET criteria, using the distal internal carotid diameter as the denominator. Multiphase CT imaging of the brain was performed following IV bolus contrast injection. Subsequent parametric perfusion maps were calculated using RAPID software. CONTRAST:  Reference EMR COMPARISON:  01/18/2019 CTA head neck. FINDINGS: CTA NECK FINDINGS Aortic arch: 2 vessel branching. Right carotid system: Atherosclerotic plaque along the common carotid and proximal ICA without flow limiting stenosis or ulceration in the proximal ICA. There is ECA origin stenosis. Left carotid system: Moderate mixed density plaque at the bifurcation without flow limiting stenosis or  ulceration. Vertebral arteries: Proximal subclavian atherosclerosis without ulceration or flow limiting stenosis. Negative for stenosis, beading, or ulceration. 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 No acute or aggressive finding Other neck: No significant incidental finding Upper chest: Negative Review of the MIP images confirms the above findings CTA HEAD FINDINGS Anterior circulation: Left M3 branch occlusion, new from prior but correlating with the known left cerebral infarcts. No large vessel occlusion. There is mild atherosclerotic plaque on the carotid siphons. Negative for aneurysm. Posterior circulation: The vertebral and basilar arteries are smooth and widely patent. No branch occlusion or flow limiting stenosis. Venous sinuses: Patent Anatomic variants: Negative Review of the MIP images confirms the above findings CT Brain Perfusion Findings: CBF (<30%) Volume: 69mL Perfusion (Tmax>6.0s) volume: 46mL Mismatch Volume: 4mL These results were called by telephone at the time of interpretation on 02/08/2019 at 10:47 am to Dr. Roland Rack , who verbally acknowledged these results. IMPRESSION: 1. Left M3 occlusion that is new from 01/18/2019 CTA but correlates with the now chronic and subacute infarct seen on the July 2020 imaging. Cerebral perfusion suggests peri-infarct penumbra measuring 19 cc. 2. Atherosclerosis without flow limiting stenosis or evident embolic source. Electronically Signed   By: Monte Fantasia M.D.   On: 02/08/2019 10:50   Ct Angio Neck W Or Wo Contrast  Result Date: 02/08/2019 CLINICAL DATA:  Weakness. EXAM: CT ANGIOGRAPHY HEAD AND NECK CT PERFUSION BRAIN TECHNIQUE: Multidetector CT imaging of the head and neck was performed using the standard protocol  during bolus administration of intravenous contrast. Multiplanar CT image reconstructions and MIPs were obtained to evaluate the vascular anatomy. Carotid stenosis measurements (when applicable) are  obtained utilizing NASCET criteria, using the distal internal carotid diameter as the denominator. Multiphase CT imaging of the brain was performed following IV bolus contrast injection. Subsequent parametric perfusion maps were calculated using RAPID software. CONTRAST:  Reference EMR COMPARISON:  01/18/2019 CTA head neck. FINDINGS: CTA NECK FINDINGS Aortic arch: 2 vessel branching. Right carotid system: Atherosclerotic plaque along the common carotid and proximal ICA without flow limiting stenosis or ulceration in the proximal ICA. There is ECA origin stenosis. Left carotid system: Moderate mixed density plaque at the bifurcation without flow limiting stenosis or ulceration. Vertebral arteries: Proximal subclavian atherosclerosis without ulceration or flow limiting stenosis. Negative for stenosis, beading, or ulceration. 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 No acute or aggressive finding Other neck: No significant incidental finding Upper chest: Negative Review of the MIP images confirms the above findings CTA HEAD FINDINGS Anterior circulation: Left M3 branch occlusion, new from prior but correlating with the known left cerebral infarcts. No large vessel occlusion. There is mild atherosclerotic plaque on the carotid siphons. Negative for aneurysm. Posterior circulation: The vertebral and basilar arteries are smooth and widely patent. No branch occlusion or flow limiting stenosis. Venous sinuses: Patent Anatomic variants: Negative Review of the MIP images confirms the above findings CT Brain Perfusion Findings: CBF (<30%) Volume: 30mL Perfusion (Tmax>6.0s) volume: 11mL Mismatch Volume: 16mL These results were called by telephone at the time of interpretation on 02/08/2019 at 10:47 am to Dr. Roland Rack , who verbally acknowledged these results. IMPRESSION: 1. Left M3 occlusion that is new from 01/18/2019 CTA but correlates with the now chronic and subacute infarct seen on the July  2020 imaging. Cerebral perfusion suggests peri-infarct penumbra measuring 19 cc. 2. Atherosclerosis without flow limiting stenosis or evident embolic source. Electronically Signed   By: Monte Fantasia M.D.   On: 02/08/2019 10:50   Ct Cerebral Perfusion W Contrast  Result Date: 02/08/2019 CLINICAL DATA:  Weakness. EXAM: CT ANGIOGRAPHY HEAD AND NECK CT PERFUSION BRAIN TECHNIQUE: Multidetector CT imaging of the head and neck was performed using the standard protocol during bolus administration of intravenous contrast. Multiplanar CT image reconstructions and MIPs were obtained to evaluate the vascular anatomy. Carotid stenosis measurements (when applicable) are obtained utilizing NASCET criteria, using the distal internal carotid diameter as the denominator. Multiphase CT imaging of the brain was performed following IV bolus contrast injection. Subsequent parametric perfusion maps were calculated using RAPID software. CONTRAST:  Reference EMR COMPARISON:  01/18/2019 CTA head neck. FINDINGS: CTA NECK FINDINGS Aortic arch: 2 vessel branching. Right carotid system: Atherosclerotic plaque along the common carotid and proximal ICA without flow limiting stenosis or ulceration in the proximal ICA. There is ECA origin stenosis. Left carotid system: Moderate mixed density plaque at the bifurcation without flow limiting stenosis or ulceration. Vertebral arteries: Proximal subclavian atherosclerosis without ulceration or flow limiting stenosis. Negative for stenosis, beading, or ulceration. 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 No acute or aggressive finding Other neck: No significant incidental finding Upper chest: Negative Review of the MIP images confirms the above findings CTA HEAD FINDINGS Anterior circulation: Left M3 branch occlusion, new from prior but correlating with the known left cerebral infarcts. No large vessel occlusion. There is mild atherosclerotic plaque on the carotid siphons.  Negative for aneurysm. Posterior circulation: The vertebral and basilar arteries are smooth and widely patent. No  branch occlusion or flow limiting stenosis. Venous sinuses: Patent Anatomic variants: Negative Review of the MIP images confirms the above findings CT Brain Perfusion Findings: CBF (<30%) Volume: 26mL Perfusion (Tmax>6.0s) volume: 48mL Mismatch Volume: 73mL These results were called by telephone at the time of interpretation on 02/08/2019 at 10:47 am to Dr. Roland Rack , who verbally acknowledged these results. IMPRESSION: 1. Left M3 occlusion that is new from 01/18/2019 CTA but correlates with the now chronic and subacute infarct seen on the July 2020 imaging. Cerebral perfusion suggests peri-infarct penumbra measuring 19 cc. 2. Atherosclerosis without flow limiting stenosis or evident embolic source. Electronically Signed   By: Monte Fantasia M.D.   On: 02/08/2019 10:50   Dg Chest Portable 1 View  Result Date: 02/08/2019 CLINICAL DATA:  68 year old male status post syncope at 0830 hours. EXAM: PORTABLE CHEST 1 VIEW COMPARISON:  01/17/2019 and earlier. FINDINGS: Portable AP semi upright view at 1026 hours. Stable left chest cardiac event recorder. Lung volumes and mediastinal contours are stable and within normal limits. Visualized tracheal air column is within normal limits. Mild scarring at the left lung base suspected, otherwise Allowing for portable technique the lungs are clear. Postoperative changes to both shoulders in the cervical spine. IMPRESSION: No acute cardiopulmonary abnormality. Electronically Signed   By: Genevie Ann M.D.   On: 02/08/2019 10:51   Ct Head Code Stroke Wo Contrast  Result Date: 02/08/2019 CLINICAL DATA:  Code stroke.  Flaccid left side EXAM: CT HEAD WITHOUT CONTRAST TECHNIQUE: Contiguous axial images were obtained from the base of the skull through the vertex without intravenous contrast. COMPARISON:  01/17/2019 FINDINGS: Brain: Cytotoxic edema in the left insula  that is new from prior. 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 Given the interval this is presumably at least partially from mineralization. Small remote anterior and high left frontal cortex infarct. Extensive small vessel ischemic gliosis in the cerebral white matter. Vascular: Hyperdense left M3 branch on coronal reformats, chronic. Skull: Negative Sinuses/Orbits: Negative Other: These results were called by telephone at the time of interpretation on 02/08/2019 at 10:23 am to Dr. Leonel Ramsay , who verbally acknowledged these results. ASPECTS [facility] Stroke Program Early CT Score) Not scored given the infarcts of various ages. IMPRESSION: 1. Small acute infarct in the left insula. 2. 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 Given the interval there is likely also developing mineralization. 3. Advanced chronic small vessel ischemia. Electronically Signed   By: Monte Fantasia M.D.   On: 02/08/2019 10:25    EKG: Independently reviewed.  Sinus rhythm with increased PR interval and low voltages.  When compared to January 19, 2019 there is no significant change.  Assessment/Plan Principal Problem:   Left arm numbness Active Problems:   Acute ischemic left MCA stroke (HCC)   Bradycardia with 31-40 beats per minute   Uncontrolled type 2 diabetes mellitus with hyperglycemia, without long-term current use of insulin (HCC)   Hyperlipidemia   Coronary atherosclerosis   OSA (obstructive sleep apnea)   Essential hypertension   Coronary artery disease involving native coronary artery of native heart without angina pectoris   H/O right coronary artery stent placement   Chest pain   Abrasion of knee, bilateral    1.  Acute ischemic left MCA stroke with associated left arm numbness and worsening dysarthria: Patient seen in consultation by  Dr. Leonel Ramsay from neurology.  He will be admitted for stroke evaluation.  He will need to see PT, OT, ST. lipid panel is pending.  He had a CTA of the head and neck in the emergency department and at this point does not require carotid Doppler studies.  Recent echocardiogram on September 19, 2018 showed normal systolic function with impaired diastolic relaxation.  It was otherwise unremarkable.  Is currently in sinus rhythm and does not require a repeat echocardiogram to evaluate for thromboembolic disease.  2.  Bradycardia with beats 31 to 40 bpm: I personally reviewed his loop recorder data and did not note any evidence of bradycardia.  I have consulted cardiology to evaluate the patient.  He is currently not on any rate controlling medications.  3.  Uncontrolled type 2 diabetes mellitus with hyperglycemia: Blood sugars fairly elevated with last hemoglobin A1c of 8.1.  Would benefit from stricter diabetes control.  We will start a cardiac diabetic diet as the patient is cleared from swallow standpoint.  Will order sliding scale insulin coverage.  Will hold metformin.  4.  Hyperlipidemia: Continue statin.  5.  Chest pain with syncope associated with coronary atherosclerosis with a history of stent in the 1990s: Noted will evaluate and discuss with cardiology.  6.  Obstructive sleep apnea: Need to discuss with patient whether or not he uses CPAP at home.  If he does we will order that.  7.  Abrasion of bilateral knees associated with syncopal event: We will place dressing wash the knees and apply Neosporin daily.  DVT prophylaxis: SCDs Code Status: DO NOT RESUSCITATE Family Communication: No family present at the time of admission. Disposition Plan: Likely home in 3 to 4 days Consults called: Neurology, Dr. Leonel Ramsay; cardiology. Admission status: Admit - It is my clinical opinion that admission to INPATIENT is reasonable and necessary because of the expectation that this patient will require  hospital care that crosses at least 2 midnights to treat this condition based on the medical complexity of the problems presented.  Given the aforementioned information, the predictability of an adverse outcome is felt to be significant.  The patient has had a extension of a previous stroke deeming him to have an acute stroke and will require extensive evaluation.  He continues to have difficulty with a aphasia.  Personally discussed with Dr. Kathrynn Speed from neurology.    Lady Deutscher MD FACP [facility] Pager [phone]  How to contact the [facility] Attending or Consulting provider [name] or covering provider during after hours [place], for this patient?  1. Check the care team in [facility] and look for a) attending/consulting [facility] provider listed and b) the [facility] team listed 2. Log into [email or web address] and use [facility] universal password to access. If you do not have the password, please contact the hospital operator. 3. Locate the [facility] provider you are looking for under [facility] and page to a number that you can be directly reached. 4. If you still have difficulty reaching the provider, please page the [facility] (Director on Call) for the Hospitalists listed on amion for [email or web address]  If 7PM-7AM, please contact night-coverage [email or web address] Password [facility]  02/08/2019, 4:35 PM

## 2019-02-08 NOTE — ED Provider Notes (Signed)
Emergency Department Provider Note   I have reviewed the triage vital signs and the nursing notes.   HISTORY  Chief Complaint Loss of Consciousness and Dizziness   HPI Andrew Cobb is a 68 y.o. male with PMH of CVA, HLD, DM, GERD, and Asthma presents to the emergency department for evaluation following syncope episode with new onset left arm numbness and speech change.  The patient was seen in July for what was initially described by wife as  stroke.  Review of the records show that the patient actually had intracerebral hemorrhage.  He was discharged from the hospital on 7/15 with only mild speech deficits.  Patient states he was walking across the road today when he suddenly felt pain in the chest and experienced a syncope event.  He fell to the ground and was helped up but after this was noted to have worsening speech deficits and the left arm numbness.  No active chest pain at this time.  Denies shortness of breath.  No report of seizure activity from bystanders on scene. Code Stroke activated with new deficits and LSN at 9:00 AM.   Past Medical History:  Diagnosis Date  . Arthritis   . Asthma   . Cancer [facility])    prostate  . Chronic venous insufficiency    varicose vein lower extremity with inflammation  . Coronary artery disease 1996   two stents placed   . Diabetes mellitus without complication ([place])    type 2 on metformin  . GERD (gastroesophageal reflux disease)    no issues since gastric bypass surgery as stated per pt  . Hyperlipidemia   . Hypogonadism in male   . MRSA (methicillin resistant Staphylococcus aureus) infection    07/30/2008 thru 08/07/2008  . Sleep apnea    on BIPAP  . Stented coronary artery   . Stroke ([place])   . Thrombocythemia [facility])     Patient Active Problem List   Diagnosis Date Noted  . Chest pain 02/08/2019  . Left arm numbness 02/08/2019  . Abrasion of knee, bilateral 02/08/2019  . Bradycardia with 31-40 beats per minute 02/08/2019  .  Diabetes mellitus without complication ([place])   . Chronic venous insufficiency   . Cancer ([place])   . Asthma   . Arthritis   . GERD (gastroesophageal reflux disease)   . Hypogonadism in male   . MRSA (methicillin resistant Staphylococcus aureus) infection   . Sleep apnea   . Stented coronary artery   . Stroke ([place])   . Thrombocythemia ([place])   . 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 Acute ischemic left MCA stroke ([place]) 09/18/2018  . Coronary artery disease involving native coronary artery of native heart without angina pectoris   . H/O right coronary artery stent placement   . Allergic rhinitis 08/19/2018  . Cervical radiculopathy 08/17/2017  . Advanced care planning/counseling discussion 07/21/2017  . Skin lesion of right arm 12/16/2016  . Prostate cancer ([place]) 12/08/2016  . H/O bariatric surgery 07/17/2016  . Essential hypertension 04/24/2015  . Uncontrolled type 2 diabetes mellitus with hyperglycemia, without -term current use of insulin ([place]) 04/24/2015  . Chronic sinusitis 08/13/2009  . Hyperlipidemia 04/02/2009  . Coronary atherosclerosis 04/02/2009  . 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 OSA (obstructive sleep apnea) 04/02/2009  . Arthritis of shoulder region, degenerative 04/02/2009  . Coronary artery disease 1996    Past Surgical History:  Procedure Laterality Date  . ANGIOPLASTY    . ANGIOPLASTY     with stent 04/07/1995  . ANTERIOR CERVICAL DECOMPRESSION/DISCECTOMY FUSION 4 LEVELS N/A 08/17/2017   Procedure: Anterior discectomy with fusion and plate fixation Cervical Three-Four, Four-Five, Five-Six, and Six-Seven Fusion;  Surgeon: Ditty, Kevan Ny, MD;  Location: [place];  Service: Neurosurgery;  Laterality: N/A;  Anterior discectomy with fusion and plate fixation Cervical Three-Four, Four-Five, Five-Six, and Six-Seven Fusion   . APPENDECTOMY      1966  . BUBBLE STUDY  09/20/2018   Procedure: BUBBLE STUDY;  Surgeon: Josue Hector, MD;  Location: [place];  Service: Cardiovascular;;  . [place]  . CARDIOVASCULAR STRESS TEST     07/31/2011  . CARPAL TUNNEL RELEASE Left   . CHOLECYSTECTOMY     2006  . colonscopy      08/25/2012  . EYE SURGERY Bilateral    cataract  . FUNCTIONAL ENDOSCOPIC SINUS SURGERY     11/10/2013  . GASTRIC BYPASS     10/05/2012  . HERNIA REPAIR     left inguinal 1981  . INCISION AND DRAINAGE ABSCESS Right 02/26/2017   Procedure: INCISION AND DRAINAGE ABSCESS;  Surgeon: Nickie Retort, MD;  Location: [facility];  Service: Urology;  Laterality: Right;  . JOINT REPLACEMENT     bilateral  . left ankle surgery      05/03/2003   . left carpel tunnel      09/18/1993  . left knee meniscal tear      01/25/2010  . left knee meniscal tear repair      05/04/1996  . left rotator cuff repair      05/03/2003   . LOOP RECORDER INSERTION N/A 09/20/2018   Procedure: LOOP RECORDER INSERTION;  Surgeon: Evans Lance, MD;  Location: [place] [facility];  Service: Cardiovascular;  Laterality: N/A;  . REPLACEMENT TOTAL KNEE BILATERAL  07/13/2015  . right ankle surgery      fracture has 2 screws 07/07/1997  . right carpel tunnel      05/16/1992  . right shoulder replacement      01/27/2006  . SCROTAL EXPLORATION Right 02/26/2017   Procedure: SCROTUM EXPLORATION;  Surgeon: Nickie Retort, MD;  Location: [facility];  Service: Urology;  Laterality: Right;  . TEE WITHOUT CARDIOVERSION N/A 09/20/2018   Procedure: TRANSESOPHAGEAL ECHOCARDIOGRAM (TEE);  Surgeon: Josue Hector, MD;  Location: [facility];  Service: Cardiovascular;  Laterality: N/A;  . TOTAL KNEE ARTHROPLASTY Left 07/13/2015   Procedure: LEFT TOTAL KNEE ARTHROPLASTY;  Surgeon: Gaynelle Arabian, MD;  Location: [facility];  Service: Orthopedics;  Laterality: Left;    Allergies Succinylsulphathiazole, Sulfamethoxazole-trimethoprim, and  Tetracyclines & related  Family History  Problem Relation Age of Onset  . Cancer Mother        pancreatic  . Diabetes Mother   . Stroke Mother   . Heart disease Mother   . Hyperlipidemia Mother   . Hypertension Mother   . Heart attack Mother   . Heart disease Father   . Stroke Father   . Diabetes Father   . Hypertension Father   . Heart attack Father   . Hyperlipidemia Father   . Pancreatic cancer Father   . Cancer Sister   . Cancer Brother        lung  . Cancer Brother   . Kidney cancer Neg Hx   . Bladder Cancer Neg Hx   . Prostate cancer Neg Hx  Social History Social History   Tobacco Use  . Smoking status: Former Smoker    Packs/day: 1.00    Years: 10.00    Pack years: 10.00    Types: Cigarettes    Quit date: 07/07/1984    Years since quitting: 34.6  . Smokeless tobacco: Never Used  . Tobacco comment: quit 1986  Substance Use Topics  . Alcohol use: No    Alcohol/week: 0.0 standard drinks  . Drug use: No    Review of Systems  Constitutional: No fever/chills Eyes: No visual changes. ENT: No sore throat. Cardiovascular: Positive chest pain and syncope.  Respiratory: Denies shortness of breath. Gastrointestinal: No abdominal pain.  No nausea, no vomiting.  No diarrhea.  No constipation. Genitourinary: Negative for dysuria. Musculoskeletal: Negative for back pain. Skin: Negative for rash. Neurological: Negative for headaches or weakness. Positive left arm numbness and speech changes.   10-point ROS otherwise negative.  ____________________________________________   PHYSICAL EXAM:  VITAL SIGNS: ED Triage Vitals [02/08/19 0957]  Enc Vitals Group     BP (!) 148/72     Pulse Rate 71     Resp 16     Temp 98.2 F (36.8 C)     Temp Source Oral     SpO2 99 %   Constitutional: Alert and oriented. Well appearing and in no acute distress. Eyes: Conjunctivae are normal.  Head: Atraumatic. Nose: No congestion/rhinnorhea. Mouth/Throat: Mucous  membranes are moist.  Neck: No stridor.  Cardiovascular: Normal rate, regular rhythm. Good peripheral circulation. Grossly normal heart sounds.   Respiratory: Normal respiratory effort.  No retractions. Lungs CTAB. Gastrointestinal: Soft and nontender. No distention.  Musculoskeletal: No lower extremity tenderness nor edema. No gross deformities of extremities. Neurologic: Mild dysarthria noted. No upper or lower extremity weakness but subjective numbness in the LUE.  Skin:  Skin is warm and dry. Abrasions to the bilateral knees.  ____________________________________________   LABS (all labs ordered are listed, but only abnormal results are displayed)  Labs Reviewed  CBC - Abnormal; Notable for the following components:      Result Value   Platelets 144 (*)    All other components within normal limits  COMPREHENSIVE METABOLIC PANEL - Abnormal; Notable for the following components:   Glucose, Bld 321 (*)    Creatinine, Ser 1.27 (*)    GFR calc non Af Amer 58 (*)    All other components within normal limits  URINALYSIS, COMPLETE (UACMP) WITH MICROSCOPIC - Abnormal; Notable for the following components:   Color, Urine STRAW (*)    Specific Gravity, Urine 1.031 (*)    All other components within normal limits  GLUCOSE, CAPILLARY - Abnormal; Notable for the following components:   Glucose-Capillary 102 (*)    All other components within normal limits  I-STAT CHEM 8, ED - Abnormal; Notable for the following components:   BUN 24 (*)    Glucose, Bld 310 (*)    All other components within normal limits  CBG MONITORING, ED - Abnormal; Notable for the following components:   Glucose-Capillary 289 (*)    All other components within normal limits  SARS CORONAVIRUS 2  PROTIME-INR  APTT  DIFFERENTIAL  RAPID URINE DRUG SCREEN, HOSP PERFORMED  HEMOGLOBIN A1C  LIPID PANEL  TSH  MAGNESIUM  TROPONIN I (HIGH SENSITIVITY)  TROPONIN I (HIGH SENSITIVITY)    ____________________________________________  EKG   EKG Interpretation  Date/Time:  Tuesday February 08 2019 11:03:13 EDT Ventricular Rate:  64 PR Interval:  226 QRS Duration:  97 QT Interval:  413 QTC Calculation: 427 R Axis:   7 Text Interpretation:  Sinus rhythm Prolonged PR interval Low voltage, precordial leads No STEMI  Confirmed by Nanda Quinton [phone]) on 02/08/2019 12:01:31 PM       ____________________________________________  RADIOLOGY  Ct Angio Head W Or Wo Contrast  Result Date: 02/08/2019 CLINICAL DATA:  Weakness. EXAM: CT ANGIOGRAPHY HEAD AND NECK CT PERFUSION BRAIN TECHNIQUE: Multidetector CT imaging of the head and neck was performed using the standard protocol during bolus administration of intravenous contrast. Multiplanar CT image reconstructions and MIPs were obtained to evaluate the vascular anatomy. Carotid stenosis measurements (when applicable) are obtained utilizing NASCET criteria, using the distal internal carotid diameter as the denominator. Multiphase CT imaging of the brain was performed following IV bolus contrast injection. Subsequent parametric perfusion maps were calculated using RAPID software. CONTRAST:  Reference EMR COMPARISON:  01/18/2019 CTA head neck. FINDINGS: CTA NECK FINDINGS Aortic arch: 2 vessel branching. Right carotid system: Atherosclerotic plaque along the common carotid and proximal ICA without flow limiting stenosis or ulceration in the proximal ICA. There is ECA origin stenosis. Left carotid system: Moderate mixed density plaque at the bifurcation without flow limiting stenosis or ulceration. Vertebral arteries: Proximal subclavian atherosclerosis without ulceration or flow limiting stenosis. Negative for stenosis, beading, or ulceration. 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 No acute or aggressive finding Other neck: No significant incidental finding Upper chest: Negative Review of the MIP images confirms the above  findings CTA HEAD FINDINGS Anterior circulation: Left M3 branch occlusion, new from prior but correlating with the known left cerebral infarcts. No large vessel occlusion. There is mild atherosclerotic plaque on the carotid siphons. Negative for aneurysm. Posterior circulation: The vertebral and basilar arteries are smooth and widely patent. No branch occlusion or flow limiting stenosis. Venous sinuses: Patent Anatomic variants: Negative Review of the MIP images confirms the above findings CT Brain Perfusion Findings: CBF (<30%) Volume: 37mL Perfusion (Tmax>6.0s) volume: 29mL Mismatch Volume: 84mL These results were called by telephone at the time of interpretation on 02/08/2019 at 10:47 am to Dr. Roland Rack , who verbally acknowledged these results. IMPRESSION: 1. Left M3 occlusion that is new from 01/18/2019 CTA but correlates with the now chronic and subacute infarct seen on the July 2020 imaging. Cerebral perfusion suggests peri-infarct penumbra measuring 19 cc. 2. Atherosclerosis without flow limiting stenosis or evident embolic source. Electronically Signed   By: Monte Fantasia M.D.   On: 02/08/2019 10:50   Ct Angio Neck W Or Wo Contrast  Result Date: 02/08/2019 CLINICAL DATA:  Weakness. EXAM: CT ANGIOGRAPHY HEAD AND NECK CT PERFUSION BRAIN TECHNIQUE: Multidetector CT imaging of the head and neck was performed using the standard protocol during bolus administration of intravenous contrast. Multiplanar CT image reconstructions and MIPs were obtained to evaluate the vascular anatomy. Carotid stenosis measurements (when applicable) are obtained utilizing NASCET criteria, using the distal internal carotid diameter as the denominator. Multiphase CT imaging of the brain was performed following IV bolus contrast injection. Subsequent parametric perfusion maps were calculated using RAPID software. CONTRAST:  Reference EMR COMPARISON:  01/18/2019 CTA head neck. FINDINGS: CTA NECK FINDINGS Aortic arch: 2  vessel branching. Right carotid system: Atherosclerotic plaque along the common carotid and proximal ICA without flow limiting stenosis or ulceration in the proximal ICA. There is ECA origin stenosis. Left carotid system: Moderate mixed density plaque at the bifurcation without flow limiting stenosis or ulceration. Vertebral arteries: Proximal subclavian atherosclerosis without ulceration or flow limiting  stenosis. Negative for stenosis, beading, or ulceration. 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 No acute or aggressive finding Other neck: No significant incidental finding Upper chest: Negative Review of the MIP images confirms the above findings CTA HEAD FINDINGS Anterior circulation: Left M3 branch occlusion, new from prior but correlating with the known left cerebral infarcts. No large vessel occlusion. There is mild atherosclerotic plaque on the carotid siphons. Negative for aneurysm. Posterior circulation: The vertebral and basilar arteries are smooth and widely patent. No branch occlusion or flow limiting stenosis. Venous sinuses: Patent Anatomic variants: Negative Review of the MIP images confirms the above findings CT Brain Perfusion Findings: CBF (<30%) Volume: 3mL Perfusion (Tmax>6.0s) volume: 47mL Mismatch Volume: 76mL These results were called by telephone at the time of interpretation on 02/08/2019 at 10:47 am to Dr. Roland Rack , who verbally acknowledged these results. IMPRESSION: 1. Left M3 occlusion that is new from 01/18/2019 CTA but correlates with the now chronic and subacute infarct seen on the July 2020 imaging. Cerebral perfusion suggests peri-infarct penumbra measuring 19 cc. 2. Atherosclerosis without flow limiting stenosis or evident embolic source. Electronically Signed   By: Monte Fantasia M.D.   On: 02/08/2019 10:50   Ct Cerebral Perfusion W Contrast  Result Date: 02/08/2019 CLINICAL DATA:  Weakness. EXAM: CT ANGIOGRAPHY HEAD AND NECK CT PERFUSION  BRAIN TECHNIQUE: Multidetector CT imaging of the head and neck was performed using the standard protocol during bolus administration of intravenous contrast. Multiplanar CT image reconstructions and MIPs were obtained to evaluate the vascular anatomy. Carotid stenosis measurements (when applicable) are obtained utilizing NASCET criteria, using the distal internal carotid diameter as the denominator. Multiphase CT imaging of the brain was performed following IV bolus contrast injection. Subsequent parametric perfusion maps were calculated using RAPID software. CONTRAST:  Reference EMR COMPARISON:  01/18/2019 CTA head neck. FINDINGS: CTA NECK FINDINGS Aortic arch: 2 vessel branching. Right carotid system: Atherosclerotic plaque along the common carotid and proximal ICA without flow limiting stenosis or ulceration in the proximal ICA. There is ECA origin stenosis. Left carotid system: Moderate mixed density plaque at the bifurcation without flow limiting stenosis or ulceration. Vertebral arteries: Proximal subclavian atherosclerosis without ulceration or flow limiting stenosis. Negative for stenosis, beading, or ulceration. 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 No acute or aggressive finding Other neck: No significant incidental finding Upper chest: Negative Review of the MIP images confirms the above findings CTA HEAD FINDINGS Anterior circulation: Left M3 branch occlusion, new from prior but correlating with the known left cerebral infarcts. No large vessel occlusion. There is mild atherosclerotic plaque on the carotid siphons. Negative for aneurysm. Posterior circulation: The vertebral and basilar arteries are smooth and widely patent. No branch occlusion or flow limiting stenosis. Venous sinuses: Patent Anatomic variants: Negative Review of the MIP images confirms the above findings CT Brain Perfusion Findings: CBF (<30%) Volume: 77mL Perfusion (Tmax>6.0s) volume: 65mL Mismatch Volume: 70mL  These results were called by telephone at the time of interpretation on 02/08/2019 at 10:47 am to Dr. Roland Rack , who verbally acknowledged these results. IMPRESSION: 1. Left M3 occlusion that is new from 01/18/2019 CTA but correlates with the now chronic and subacute infarct seen on the July 2020 imaging. Cerebral perfusion suggests peri-infarct penumbra measuring 19 cc. 2. Atherosclerosis without flow limiting stenosis or evident embolic source. Electronically Signed   By: Monte Fantasia M.D.   On: 02/08/2019 10:50   Dg Chest Portable 1 View  Result Date: 02/08/2019 CLINICAL DATA:  68 year old male status post syncope at 0830 hours. EXAM: PORTABLE CHEST 1 VIEW COMPARISON:  01/17/2019 and earlier. FINDINGS: Portable AP semi upright view at 1026 hours. Stable left chest cardiac event recorder. Lung volumes and mediastinal contours are stable and within normal limits. Visualized tracheal air column is within normal limits. Mild scarring at the left lung base suspected, otherwise Allowing for portable technique the lungs are clear. Postoperative changes to both shoulders in the cervical spine. IMPRESSION: No acute cardiopulmonary abnormality. Electronically Signed   By: Genevie Ann M.D.   On: 02/08/2019 10:51   Ct Head Code Stroke Wo Contrast  Result Date: 02/08/2019 CLINICAL DATA:  Code stroke.  Flaccid left side EXAM: CT HEAD WITHOUT CONTRAST TECHNIQUE: Contiguous axial images were obtained from the base of the skull through the vertex without intravenous contrast. COMPARISON:  01/17/2019 FINDINGS: Brain: Cytotoxic edema in the left insula that is new from prior. 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 Given the interval this is presumably at least partially from mineralization. Small remote anterior and high left frontal cortex infarct. Extensive small vessel ischemic gliosis in the cerebral white matter. Vascular:  Hyperdense left M3 branch on coronal reformats, chronic. Skull: Negative Sinuses/Orbits: Negative Other: These results were called by telephone at the time of interpretation on 02/08/2019 at 10:23 am to Dr. Leonel Ramsay , who verbally acknowledged these results. ASPECTS [facility] Stroke Program Early CT Score) Not scored given the infarcts of various ages. IMPRESSION: 1. Small acute infarct in the left insula. 2. 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 Given the interval there is likely also developing mineralization. 3. Advanced chronic small vessel ischemia. Electronically Signed   By: Monte Fantasia M.D.   On: 02/08/2019 10:25    ____________________________________________   PROCEDURES  Procedure(s) performed:   Procedures  CRITICAL CARE Performed by: Margette Fast Total critical care time: 35 minutes Critical care time was exclusive of separately billable procedures and treating other patients. Critical care was necessary to treat or prevent imminent or life-threatening deterioration. Critical care was time spent personally by me on the following activities: development of treatment plan with patient and/or surrogate as well as nursing, discussions with consultants, evaluation of patient's response to treatment, examination of patient, obtaining history from patient or surrogate, ordering and performing treatments and interventions, ordering and review of laboratory studies, ordering and review of radiographic studies, pulse oximetry and re-evaluation of patient's condition.  Nanda Quinton, MD Emergency Medicine  ____________________________________________   INITIAL IMPRESSION / ASSESSMENT AND PLAN / ED COURSE  Pertinent labs & imaging results that were available during my care of the patient were reviewed by me and considered in my medical decision making (see chart for details).   Patient  arrives by private vehicle after experiencing chest pain with syncope and new neuro deficits.  Last seen normal at 9 AM.  Code stroke activated after my initial evaluation at the bridge.  Upon further chart review the patient actually had intracerebral hemorrhage rather than ischemic stroke in July which was initially reported.  Triage EKG also evaluated which shows no acute ischemic changes.  Troponin pending.   CT reviewed along with CXR. Labs unremarkable. Neurology consulted with CTA showing new occlusion at M3 and concern for extension of CVA territory. No active CP. EKG and troponin reviewed. No abnormality on ED tele monitoring. Very low PE suspicion. Plan for admit.  Discussed patient's case with Hospitalist, Dr. Evangeline Gula to request admission. Patient and family (if present) updated with plan. Care transferred to Hospitalist service.  I reviewed all nursing notes, vitals, pertinent old records, EKGs, labs, imaging (as available).  ____________________________________________  FINAL CLINICAL IMPRESSION(S) / ED DIAGNOSES  Final diagnoses:  Left arm numbness  Precordial chest pain  Syncope, unspecified syncope type     MEDICATIONS GIVEN DURING THIS VISIT:  Medications  sodium chloride flush (NS) 0.9 % injection 3 mL (has no administration in time range)  valACYclovir (VALTREX) tablet 500 mg (has no administration in time range)  atorvastatin (LIPITOR) tablet 40 mg (has no administration in time range)  benazepril (LOTENSIN) tablet 40 mg (has no administration in time range)  amitriptyline (ELAVIL) tablet 25 mg (has no administration in time range)  cholecalciferol (VITAMIN D3) tablet 2,000 Units (2,000 Units Oral Given 02/08/19 1805)  multivitamin with minerals tablet 1 tablet (1 tablet Oral Given 02/08/19 1805)  loratadine (CLARITIN) tablet 10 mg (10 mg Oral Given 02/08/19 1805)  0.9 %  sodium chloride infusion ( Intravenous New Bag/Given 02/08/19 1811)  acetaminophen (TYLENOL) tablet  650 mg (has no administration in time range)    Or  acetaminophen (TYLENOL) solution 650 mg (has no administration in time range)    Or  acetaminophen (TYLENOL) suppository 650 mg (has no administration in time range)  senna-docusate (Senokot-S) tablet 1 tablet (has no administration in time range)  insulin aspart (novoLOG) injection 0-15 Units (0 Units Subcutaneous Not Given 02/08/19 1805)  insulin aspart (novoLOG) injection 0-5 Units (has no administration in time range)  magnesium oxide (MAG-OX) tablet 200 mg (has no administration in time range)  iohexol (OMNIPAQUE) 350 MG/ML injection 100 mL (100 mLs Intravenous Contrast Given 02/08/19 1037)   stroke: mapping our early stages of recovery book ( Does not apply Given 02/08/19 1804)    Note:  This document was prepared using Dragon voice recognition software and may include unintentional dictation errors.  Nanda Quinton, MD Emergency Medicine    , Wonda Olds, MD 02/08/19 Dorthula Perfect

## 2019-02-09 ENCOUNTER — Inpatient Hospital Stay (HOSPITAL_COMMUNITY): Payer: PPO

## 2019-02-09 ENCOUNTER — Other Ambulatory Visit: Payer: Self-pay | Admitting: Student

## 2019-02-09 DIAGNOSIS — S80211A Abrasion, right knee, initial encounter: Secondary | ICD-10-CM

## 2019-02-09 DIAGNOSIS — I639 Cerebral infarction, unspecified: Secondary | ICD-10-CM

## 2019-02-09 DIAGNOSIS — R55 Syncope and collapse: Secondary | ICD-10-CM

## 2019-02-09 DIAGNOSIS — I2510 Atherosclerotic heart disease of native coronary artery without angina pectoris: Secondary | ICD-10-CM

## 2019-02-09 DIAGNOSIS — R200 Anesthesia of skin: Secondary | ICD-10-CM

## 2019-02-09 LAB — GLUCOSE, CAPILLARY
Glucose-Capillary: 84 mg/dL (ref 70–99)
Glucose-Capillary: 184 mg/dL — ABNORMAL HIGH (ref 70–99)
Glucose-Capillary: 99 mg/dL (ref 70–99)
Glucose-Capillary: 104 mg/dL — ABNORMAL HIGH (ref 70–99)

## 2019-02-09 LAB — LIPID PANEL
Cholesterol: 104 mg/dL (ref 0–200)
HDL: 47 mg/dL (ref >40)
LDL Cholesterol: 42 mg/dL (ref 0–99)
Total CHOL/HDL Ratio: 2.2 RATIO
Triglycerides: 77 mg/dL (ref <150)
VLDL: 15 mg/dL (ref 0–40)

## 2019-02-09 LAB — HEMOGLOBIN A1C
Hgb A1c MFr Bld: 7.7 % — ABNORMAL HIGH (ref 4.8–5.6)
Mean Plasma Glucose: 174.29 mg/dL

## 2019-02-09 LAB — MAGNESIUM: Magnesium: 2.1 mg/dL (ref 1.7–2.4)

## 2019-02-09 LAB — TSH: TSH: 2.438 u[IU]/mL (ref 0.350–4.500)

## 2019-02-09 MED ORDER — ASPIRIN EC 81 MG PO TBEC
81.0000 mg | DELAYED_RELEASE_TABLET | Freq: Every day | ORAL | Status: DC
  Administered 2019-02-09 – 2019-02-10 (×2): 81 mg via ORAL
  Filled 2019-02-09 (×2): qty 1

## 2019-02-09 MED ORDER — ATORVASTATIN CALCIUM 10 MG PO TABS
20.0000 mg | ORAL_TABLET | Freq: Every day | ORAL | Status: DC
  Administered 2019-02-09: 22:00:00 20 mg via ORAL
  Filled 2019-02-09: qty 2

## 2019-02-09 MED ORDER — BACITRACIN-NEOMYCIN-POLYMYXIN OINTMENT TUBE
TOPICAL_OINTMENT | Freq: Every day | CUTANEOUS | Status: DC
  Administered 2019-02-09 – 2019-02-10 (×2): via TOPICAL
  Filled 2019-02-09: qty 14

## 2019-02-09 NOTE — Consult Note (Addendum)
ELECTROPHYSIOLOGY CONSULT NOTE    Patient ID: Andrew Cobb MRN: [id number], DOB/AGE: [birth year] 68 y.o.  Admit date: 02/08/2019 Date of Consult: 02/09/2019  Primary Physician: Guadalupe Maple, MD Primary Cardiologist/EP: Dr. Lovena Le  Referring Provider: Dr. Wynelle Cleveland  Patient Profile: Andrew Cobb is a 68 y.o. male with a history of CAD s/p PCI 2006 a [facility], DM, OSA on Bipap, Cryptogenic stroke s/p loop record who is being seen today for the evaluation of bradycardia at the request of Dr. Wynelle Cleveland.  HPI:  Andrew Cobb is a 68 y.o. male presented to [facility] 02/08/2019 with syncope and dizziness with associated left arm numbness and speech changes. Work up ongoing per neurology for ? Recurrent CVA. EP asked to see due to bradycardia on tele.   CTA shows new left M3 occlusion compared to [facility] 01/18/2019.  Pt states he was walking around his neighborhood on Monday am (usually walks 2-2.5 miles a day). He had walked approx 1 mile when he had a syncopal episode. Two gentleman helped him up and he told them he was fine to keep going. He started walking and fell back down, not sure if he lost consciousness the second time.   Today he is feeling well. Found to have new stroke as above. MRI pending. He continues to have difficulty with word finding, but is overall felling OK.  He currently denies chest pain, palpitations, dyspnea, PND, orthopnea, nausea, vomiting, dizziness, edema, weight gain, or early satiety.  ILR interrogated personally. Pt has no episodes correlated with his syncopal episode. Though pause detection turned off 02/01/2019 due to undersensing (R waves 0.12 - 0.16 mV). Heart rate histograms show occasional rates <40, but this again may be affected by his undersensing.  Past Medical History:  Diagnosis Date  . Arthritis   . Asthma   . Cancer [facility])    prostate  . Chronic venous insufficiency    varicose vein lower extremity with inflammation  . Coronary artery disease 1996   two  stents placed   . Diabetes mellitus without complication ([place])    type 2 on metformin  . GERD (gastroesophageal reflux disease)    no issues since gastric bypass surgery as stated per pt  . Hyperlipidemia   . Hypogonadism in male   . MRSA (methicillin resistant Staphylococcus aureus) infection    07/30/2008 thru 08/07/2008  . Sleep apnea    on BIPAP  . Stented coronary artery   . Stroke ([place])   . Thrombocythemia [facility])      Surgical History:  Past Surgical History:  Procedure Laterality Date  . ANGIOPLASTY    . ANGIOPLASTY     with stent 04/07/1995  . ANTERIOR CERVICAL DECOMPRESSION/DISCECTOMY FUSION 4 LEVELS N/A 08/17/2017   Procedure: Anterior discectomy with fusion and plate fixation Cervical Three-Four, Four-Five, Five-Six, and Six-Seven Fusion;  Surgeon: Ditty, Kevan Ny, MD;  Location: [place];  Service: Neurosurgery;  Laterality: N/A;  Anterior discectomy with fusion and plate fixation Cervical Three-Four, Four-Five, Five-Six, and Six-Seven Fusion   . APPENDECTOMY     1966  . BUBBLE STUDY  09/20/2018   Procedure: BUBBLE STUDY;  Surgeon: Josue Hector, MD;  Location: [place];  Service: Cardiovascular;;  . [place]  . CARDIOVASCULAR STRESS TEST     07/31/2011  . CARPAL TUNNEL RELEASE Left   . CHOLECYSTECTOMY     2006  . colonscopy      08/25/2012  . EYE SURGERY Bilateral    cataract  .  FUNCTIONAL ENDOSCOPIC SINUS SURGERY     11/10/2013  . GASTRIC BYPASS     10/05/2012  . HERNIA REPAIR     left inguinal 1981  . INCISION AND DRAINAGE ABSCESS Right 02/26/2017   Procedure: INCISION AND DRAINAGE ABSCESS;  Surgeon: Nickie Retort, MD;  Location: [facility];  Service: Urology;  Laterality: Right;  . JOINT REPLACEMENT     bilateral  . left ankle surgery      05/03/2003   . left carpel tunnel      09/18/1993  . left knee meniscal tear      01/25/2010  . left knee meniscal tear repair      05/04/1996  . left rotator cuff repair      05/03/2003    . LOOP RECORDER INSERTION N/A 09/20/2018   Procedure: LOOP RECORDER INSERTION;  Surgeon: Evans Lance, MD;  Location: [place] [facility];  Service: Cardiovascular;  Laterality: N/A;  . REPLACEMENT TOTAL KNEE BILATERAL  07/13/2015  . right ankle surgery      fracture has 2 screws 07/07/1997  . right carpel tunnel      05/16/1992  . right shoulder replacement      01/27/2006  . SCROTAL EXPLORATION Right 02/26/2017   Procedure: SCROTUM EXPLORATION;  Surgeon: Nickie Retort, MD;  Location: [facility];  Service: Urology;  Laterality: Right;  . TEE WITHOUT CARDIOVERSION N/A 09/20/2018   Procedure: TRANSESOPHAGEAL ECHOCARDIOGRAM (TEE);  Surgeon: Josue Hector, MD;  [facility] END[facility]COPY;  Service: Cardiovascular;  Laterality: N/A;  . TOTAL KNEE ARTHROPLASTY Left 07/13/2015   Procedure: LEFT TOTAL KNEE ARTHROPLASTY;  Surgeon: Gaynelle Arabian, MD;  Location: [facility];  Service: Orthopedics;  Laterality: Left;     Medications Prior to Admission  Medication Sig Dispense Refill Last Dose  . amitriptyline (ELAVIL) 25 MG tablet Take 1 tablet (25 mg total) by mouth at bedtime. 90 tablet 3 02/07/2019 at Unknown time  . atorvastatin (LIPITOR) 40 MG tablet Take 40 mg by mouth at bedtime.    02/07/2019 at Unknown time  . benazepril (LOTENSIN) 40 MG tablet Take 0.5 tablets (20 mg total) by mouth daily. (Patient taking differently: Take 40 mg by mouth daily. ) 90 tablet 3 02/07/2019 at Unknown time  . cetirizine (ZYRTEC) 10 MG tablet Take 10 mg by mouth daily.   02/07/2019 at Unknown time  . Cholecalciferol (VITAMIN D) 2000 units CAPS Take 2,000 Units by mouth daily.   02/07/2019 at Unknown time  . Magnesium 250 MG TABS Take 250 mg by mouth at bedtime.    02/07/2019 at Unknown time  . metFORMIN (GLUCOPHAGE) 500 MG tablet Take 1 tablet (500 mg total) by mouth 2 (two) times daily with a meal. 180 tablet 2 02/07/2019 at Unknown time  . Multiple Vitamins-Minerals (MULTIVITAMIN PO) Take 1 tablet by mouth daily.    02/07/2019 at  Unknown time  . valACYclovir (VALTREX) 500 MG tablet Take 1 tablet (500 mg total) by mouth daily. (Patient taking differently: Take 500 mg by mouth at bedtime. ) 90 tablet 2 02/07/2019 at Unknown time  . glucose blood test strip 1 each by Other route 2 (two) times daily. DX E11.9 100 each 12   . Lancets (ONETOUCH ULTRASOFT) lancets 1 each by Other route 2 (two) times daily. Dx E11.9 100 each 12     Inpatient Medications:  . amitriptyline  25 mg Oral QHS  . atorvastatin  40 mg Oral QHS  . benazepril  40 mg Oral Daily  .  cholecalciferol  2,000 Units Oral Daily  . insulin aspart  0-15 Units Subcutaneous TID WC  . insulin aspart  0-5 Units Subcutaneous QHS  . loratadine  10 mg Oral Daily  . magnesium oxide  200 mg Oral QHS  . multivitamin with minerals  1 tablet Oral Daily  . neomycin-bacitracin-polymyxin   Topical Daily  . sodium chloride flush  3 mL Intravenous Once  . valACYclovir  500 mg Oral QHS    Allergies:  Allergies  Allergen Reactions  . Succinylsulphathiazole Rash  . Sulfamethoxazole-Trimethoprim Rash  . Tetracyclines & Related Rash    Social History   Socioeconomic History  . Marital status: Married    Spouse name: Not on file  . Number of children: Not on file  . Years of education: Not on file  . Highest education level: Some college, no degree  Occupational History    Comment: drives for [employer]   Social Needs  . Financial resource strain: Not hard at all  . Food insecurity    Worry: Never true    Inability: Never true  . Transportation needs    Medical: No    Non-medical: No  Tobacco Use  . Smoking status: Former Smoker    Packs/day: 1.00    Years: 10.00    Pack years: 10.00    Types: Cigarettes    Quit date: 07/07/1984    Years since quitting: 34.6  . Smokeless tobacco: Never Used  . Tobacco comment: quit 1986  Substance and Sexual Activity  . Alcohol use: No    Alcohol/week: 0.0 standard drinks  . Drug use: No  . Sexual activity: Yes  Lifestyle   . Physical activity    Days per week: 0 days    Minutes per session: 0 min  . Stress: Not at all  Relationships  . Social connections    Talks on phone: More than three times a week    Gets together: More than three times a week    Attends religious service: More than 4 times per year    Active member of club or organization: Yes    Attends meetings of clubs or organizations: Not on file    Relationship status: Married  . Intimate partner violence    Fear of current or ex partner: No    Emotionally abused: No    Physically abused: No    Forced sexual activity: No  Other Topics Concern  . Not on file  Social History Narrative  . Not on file     Family History  Problem Relation Age of Onset  . Cancer Mother        pancreatic  . Diabetes Mother   . Stroke Mother   . Heart disease Mother   . Hyperlipidemia Mother   . Hypertension Mother   . Heart attack Mother   . Heart disease Father   . Stroke Father   . Diabetes Father   . Hypertension Father   . Heart attack Father   . Hyperlipidemia Father   . Pancreatic cancer Father   . Cancer Sister   . Cancer Brother        lung  . Cancer Brother   . Kidney cancer Neg Hx   . Bladder Cancer Neg Hx   . Prostate cancer Neg Hx      Review of Systems: All other systems reviewed and are otherwise negative except as noted above.  Physical Exam: Vitals:   02/08/19 2200 02/08/19 2321 02/09/19 0300  02/09/19 0800  BP: (!) 166/86 (!) 141/77 129/67 (!) 144/88  Pulse: (!) 51 (!) 53 (!) 59 (!) 50  Resp: 20 18 18 18   Temp: 97.9 F (36.6 C) 98 F (36.7 C) 98.3 F (36.8 C) 98.6 F (37 C)  TempSrc: Oral Oral Oral Oral  SpO2: 97% 99% 99% 99%  Weight:      Height:        GEN- The patient is well appearing, alert and oriented x 3 today.   HEENT: normocephalic, atraumatic; sclera clear, conjunctiva pink; hearing intact; oropharynx clear; neck supple Lungs- Clear to ausculation bilaterally, normal work of breathing.  No wheezes,  rales, rhonchi Heart- Regular rate and rhythm, no murmurs, rubs or gallops GI- soft, non-tender, non-distended, bowel sounds present Extremities- no clubbing, cyanosis, or edema; DP/PT/radial pulses 2+ bilaterally MS- no significant deformity or atrophy Skin- warm and dry, no rash or lesion Psych- euthymic mood, full affect Neuro- strength and sensation are intact  Labs:   Lab Results  Component Value Date   WBC 4.1 02/08/2019   HGB 14.6 02/08/2019   HCT 43.0 02/08/2019   MCV 98.2 02/08/2019   PLT 144 (L) 02/08/2019    Recent Labs  Lab 02/08/19 1000 02/08/19 1013  NA 137 137  K 4.3 4.4  CL 101 99  CO2 23  --   BUN 20 24*  CREATININE 1.27* 1.10  CALCIUM 9.2  --   PROT 6.5  --   BILITOT 1.1  --   ALKPHOS 62  --   ALT 37  --   AST 29  --   GLUCOSE 321* 310*      Radiology/Studies: Ct Angio Head W Or Wo Contrast  Result Date: 02/08/2019 CLINICAL DATA:  Weakness. EXAM: CT ANGIOGRAPHY HEAD AND NECK CT PERFUSION BRAIN TECHNIQUE: Multidetector CT imaging of the head and neck was performed using the standard protocol during bolus administration of intravenous contrast. Multiplanar CT image reconstructions and MIPs were obtained to evaluate the vascular anatomy. Carotid stenosis measurements (when applicable) are obtained utilizing NASCET criteria, using the distal internal carotid diameter as the denominator. Multiphase CT imaging of the brain was performed following IV bolus contrast injection. Subsequent parametric perfusion maps were calculated using RAPID software. CONTRAST:  Reference EMR COMPARISON:  01/18/2019 CTA head neck. FINDINGS: CTA NECK FINDINGS Aortic arch: 2 vessel branching. Right carotid system: Atherosclerotic plaque along the common carotid and proximal ICA without flow limiting stenosis or ulceration in the proximal ICA. There is ECA origin stenosis. Left carotid system: Moderate mixed density plaque at the bifurcation without flow limiting stenosis or ulceration.  Vertebral arteries: Proximal subclavian atherosclerosis without ulceration or flow limiting stenosis. Negative for stenosis, beading, or ulceration. 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 No acute or aggressive finding Other neck: No significant incidental finding Upper chest: Negative Review of the MIP images confirms the above findings CTA HEAD FINDINGS Anterior circulation: Left M3 branch occlusion, new from prior but correlating with the known left cerebral infarcts. No large vessel occlusion. There is mild atherosclerotic plaque on the carotid siphons. Negative for aneurysm. Posterior circulation: The vertebral and basilar arteries are smooth and widely patent. No branch occlusion or flow limiting stenosis. Venous sinuses: Patent Anatomic variants: Negative Review of the MIP images confirms the above findings CT Brain Perfusion Findings: CBF (<30%) Volume: 6mL Perfusion (Tmax>6.0s) volume: 75mL Mismatch Volume: 62mL These results were called by telephone at the time of interpretation on 02/08/2019 at 10:47 am to Dr. Roland Rack ,  who verbally acknowledged these results. IMPRESSION: 1. Left M3 occlusion that is new from 01/18/2019 CTA but correlates with the now chronic and subacute infarct seen on the July 2020 imaging. Cerebral perfusion suggests peri-infarct penumbra measuring 19 cc. 2. Atherosclerosis without flow limiting stenosis or evident embolic source. Electronically Signed   By: Monte Fantasia M.D.   On: 02/08/2019 10:50   Ct Angio Head W Or Wo Contrast  Result Date: 01/18/2019 CLINICAL DATA:  Stroke workup EXAM: CT ANGIOGRAPHY HEAD AND NECK TECHNIQUE: Multidetector CT imaging of the head and neck was performed using the standard protocol during bolus administration of intravenous contrast. Multiplanar CT image reconstructions and MIPs were obtained to evaluate the vascular anatomy. Carotid stenosis measurements (when applicable) are obtained utilizing NASCET  criteria, using the distal internal carotid diameter as the denominator. CONTRAST:  115mL OMNIPAQUE IOHEXOL 350 MG/ML SOLN COMPARISON:  Brain MRI from yesterday FINDINGS: CTA NECK FINDINGS Aortic arch: Normal with 2 vessel branching. Right carotid system: Moderate mixed density plaque at the bifurcation and ICA bulb without flow limiting stenosis or ulceration. Negative for beading. Left carotid system: Moderate mixed density plaque about the left carotid bifurcation. No ulceration or stenosis. No downstream beading Vertebral arteries: Proximal subclavian atherosclerosis without flow limiting stenosis. Plaque at the left V1 segment without flow limiting stenosis. Negative for vertebral beading or dissection. Skeleton: C3-C7 ACDF with solid arthrodesis. There is also ankylosis at C2-3. Other neck: No acute or aggressive finding Upper chest: Negative Review of the MIP images confirms the above findings CTA HEAD FINDINGS Anterior circulation: Mild plaque on the carotid siphons. No major branch occlusion or flow limiting stenosis. Hypoplastic right A1 segment. Negative for aneurysm. Posterior circulation: Vertebral and basilar arteries are smooth and diffusely patent. Hypoplastic left P1 segment. No branch occlusion or flow limiting stenosis. Venous sinuses: Diffusely patent Anatomic variants: As above Delayed phase: Not obtained Acute and chronic ischemic changes with petechial hemorrhage that is not changed from prior brain MRI allowing for cross modality differences. Review of the MIP images confirms the above findings IMPRESSION: 1. No emergent finding. 2. Atherosclerosis without flow limiting stenosis, clear embolic source, or evident vasculopathy. Electronically Signed   By: Monte Fantasia M.D.   On: 01/18/2019 11:56   Ct Angio Neck W Or Wo Contrast  Result Date: 02/08/2019 CLINICAL DATA:  Weakness. EXAM: CT ANGIOGRAPHY HEAD AND NECK CT PERFUSION BRAIN TECHNIQUE: Multidetector CT imaging of the head and neck  was performed using the standard protocol during bolus administration of intravenous contrast. Multiplanar CT image reconstructions and MIPs were obtained to evaluate the vascular anatomy. Carotid stenosis measurements (when applicable) are obtained utilizing NASCET criteria, using the distal internal carotid diameter as the denominator. Multiphase CT imaging of the brain was performed following IV bolus contrast injection. Subsequent parametric perfusion maps were calculated using RAPID software. CONTRAST:  Reference EMR COMPARISON:  01/18/2019 CTA head neck. FINDINGS: CTA NECK FINDINGS Aortic arch: 2 vessel branching. Right carotid system: Atherosclerotic plaque along the common carotid and proximal ICA without flow limiting stenosis or ulceration in the proximal ICA. There is ECA origin stenosis. Left carotid system: Moderate mixed density plaque at the bifurcation without flow limiting stenosis or ulceration. Vertebral arteries: Proximal subclavian atherosclerosis without ulceration or flow limiting stenosis. Negative for stenosis, beading, or ulceration. 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 No acute or aggressive finding Other neck: No significant incidental finding Upper chest: Negative Review of the MIP images confirms the above findings CTA HEAD FINDINGS Anterior  circulation: Left M3 branch occlusion, new from prior but correlating with the known left cerebral infarcts. No large vessel occlusion. There is mild atherosclerotic plaque on the carotid siphons. Negative for aneurysm. Posterior circulation: The vertebral and basilar arteries are smooth and widely patent. No branch occlusion or flow limiting stenosis. Venous sinuses: Patent Anatomic variants: Negative Review of the MIP images confirms the above findings CT Brain Perfusion Findings: CBF (<30%) Volume: 33mL Perfusion (Tmax>6.0s) volume: 3mL Mismatch Volume: 66mL These results were called by telephone at the time of  interpretation on 02/08/2019 at 10:47 am to Dr. Roland Rack , who verbally acknowledged these results. IMPRESSION: 1. Left M3 occlusion that is new from 01/18/2019 CTA but correlates with the now chronic and subacute infarct seen on the July 2020 imaging. Cerebral perfusion suggests peri-infarct penumbra measuring 19 cc. 2. Atherosclerosis without flow limiting stenosis or evident embolic source. Electronically Signed   By: Monte Fantasia M.D.   On: 02/08/2019 10:50   Ct Angio Neck W Or Wo Contrast  Result Date: 01/18/2019 CLINICAL DATA:  Stroke workup EXAM: CT ANGIOGRAPHY HEAD AND NECK TECHNIQUE: Multidetector CT imaging of the head and neck was performed using the standard protocol during bolus administration of intravenous contrast. Multiplanar CT image reconstructions and MIPs were obtained to evaluate the vascular anatomy. Carotid stenosis measurements (when applicable) are obtained utilizing NASCET criteria, using the distal internal carotid diameter as the denominator. CONTRAST:  149mL OMNIPAQUE IOHEXOL 350 MG/ML SOLN COMPARISON:  Brain MRI from yesterday FINDINGS: CTA NECK FINDINGS Aortic arch: Normal with 2 vessel branching. Right carotid system: Moderate mixed density plaque at the bifurcation and ICA bulb without flow limiting stenosis or ulceration. Negative for beading. Left carotid system: Moderate mixed density plaque about the left carotid bifurcation. No ulceration or stenosis. No downstream beading Vertebral arteries: Proximal subclavian atherosclerosis without flow limiting stenosis. Plaque at the left V1 segment without flow limiting stenosis. Negative for vertebral beading or dissection. Skeleton: C3-C7 ACDF with solid arthrodesis. There is also ankylosis at C2-3. Other neck: No acute or aggressive finding Upper chest: Negative Review of the MIP images confirms the above findings CTA HEAD FINDINGS Anterior circulation: Mild plaque on the carotid siphons. No major branch occlusion or  flow limiting stenosis. Hypoplastic right A1 segment. Negative for aneurysm. Posterior circulation: Vertebral and basilar arteries are smooth and diffusely patent. Hypoplastic left P1 segment. No branch occlusion or flow limiting stenosis. Venous sinuses: Diffusely patent Anatomic variants: As above Delayed phase: Not obtained Acute and chronic ischemic changes with petechial hemorrhage that is not changed from prior brain MRI allowing for cross modality differences. Review of the MIP images confirms the above findings IMPRESSION: 1. No emergent finding. 2. Atherosclerosis without flow limiting stenosis, clear embolic source, or evident vasculopathy. Electronically Signed   By: Monte Fantasia M.D.   On: 01/18/2019 11:56   Mr Jeri Cos [place] Contrast  Result Date: 01/18/2019 CLINICAL DATA:  68 y/o  M; intracranial hemorrhage for follow-up. EXAM: MRI HEAD WITHOUT AND WITH CONTRAST TECHNIQUE: Multiplanar, multiecho pulse sequences of the brain and surrounding structures were obtained without and with intravenous contrast. CONTRAST:  9.3 cc Gadavist COMPARISON:  01/17/2019 CT head.  09/18/2018 MRI head. FINDINGS: Brain: Focus of reduced diffusion measuring 3.0 x 3.5 x 2.0 cm (volume = 11 cm^3) centered within the left temporoparietal junction compatible with acute/early subacute infarction. There is confluent petechial hemorrhage within the new area of acute/early subacute infarction (series 12, image 34). The acute/early subacute infarction is at the  anterior margin of a now chronic left lateral parietal hemorrhagic infarction identified on the 3/14 MRI of the head. Stable chronic hemorrhagic infarcts within the left superior parietal lobe and left anterolateral frontal lobe. Increased susceptibility hypointensity within the left lateral parietal lobe chronic infarction consistent with findings of recent hemorrhage on CT. There is laminar necrosis with T1 shortening within the left parietal lobe chronic infarctions and  mild non masslike enhancement which can be seen with stroke or hemorrhage. Large confluent nonspecific T2 FLAIR hyperintensities in subcortical and periventricular white matter are compatible with severe chronic microvascular ischemic changes. Mild volume loss of the brain. No hydrocephalus or herniation. Very small chronic infarct in the right lentiform nucleus. Vascular: Normal flow voids. Skull and upper cervical spine: Normal marrow signal. Sinuses/Orbits: Negative. Other: None. IMPRESSION: 1. Acute/early subacute infarction, measuring up to 3.5 cm 11 cc, centered within the left temporoparietal junction. Associated confluent petechial hemorrhage without mass effect, [place] calssification 1b: HI2, confluent petechiae, no mass effect. 2. New hemorrhage within the the adjacent left lateral parietal lobe chronic infarction. 3. Stable small chronic hemorrhagic infarcts in the left superior parietal lobe and the left frontal lobe. 4. No additional findings of intracranial hemorrhage. These results were called by telephone at the time of interpretation on 01/18/2019 at 12:51 am to Dr. Cheral Marker, who verbally acknowledged these results. Electronically Signed   By: Kristine Garbe M.D.   On: 01/18/2019 00:54   Ct Cerebral Perfusion W Contrast  Result Date: 02/08/2019 CLINICAL DATA:  Weakness. EXAM: CT ANGIOGRAPHY HEAD AND NECK CT PERFUSION BRAIN TECHNIQUE: Multidetector CT imaging of the head and neck was performed using the standard protocol during bolus administration of intravenous contrast. Multiplanar CT image reconstructions and MIPs were obtained to evaluate the vascular anatomy. Carotid stenosis measurements (when applicable) are obtained utilizing NASCET criteria, using the distal internal carotid diameter as the denominator. Multiphase CT imaging of the brain was performed following IV bolus contrast injection. Subsequent parametric perfusion maps were calculated using RAPID software. CONTRAST:   Reference EMR COMPARISON:  01/18/2019 CTA head neck. FINDINGS: CTA NECK FINDINGS Aortic arch: 2 vessel branching. Right carotid system: Atherosclerotic plaque along the common carotid and proximal ICA without flow limiting stenosis or ulceration in the proximal ICA. There is ECA origin stenosis. Left carotid system: Moderate mixed density plaque at the bifurcation without flow limiting stenosis or ulceration. Vertebral arteries: Proximal subclavian atherosclerosis without ulceration or flow limiting stenosis. Negative for stenosis, beading, or ulceration. 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 No acute or aggressive finding Other neck: No significant incidental finding Upper chest: Negative Review of the MIP images confirms the above findings CTA HEAD FINDINGS Anterior circulation: Left M3 branch occlusion, new from prior but correlating with the known left cerebral infarcts. No large vessel occlusion. There is mild atherosclerotic plaque on the carotid siphons. Negative for aneurysm. Posterior circulation: The vertebral and basilar arteries are smooth and widely patent. No branch occlusion or flow limiting stenosis. Venous sinuses: Patent Anatomic variants: Negative Review of the MIP images confirms the above findings CT Brain Perfusion Findings: CBF (<30%) Volume: 3mL Perfusion (Tmax>6.0s) volume: 67mL Mismatch Volume: 7mL These results were called by telephone at the time of interpretation on 02/08/2019 at 10:47 am to Dr. Roland Rack , who verbally acknowledged these results. IMPRESSION: 1. Left M3 occlusion that is new from 01/18/2019 CTA but correlates with the now chronic and subacute infarct seen on the July 2020 imaging. Cerebral perfusion suggests peri-infarct penumbra measuring 19 cc.  2. Atherosclerosis without flow limiting stenosis or evident embolic source. Electronically Signed   By: Monte Fantasia M.D.   On: 02/08/2019 10:50   Dg Chest Portable 1 View  Result Date:  02/08/2019 CLINICAL DATA:  68 year old male status post syncope at 0830 hours. EXAM: PORTABLE CHEST 1 VIEW COMPARISON:  01/17/2019 and earlier. FINDINGS: Portable AP semi upright view at 1026 hours. Stable left chest cardiac event recorder. Lung volumes and mediastinal contours are stable and within normal limits. Visualized tracheal air column is within normal limits. Mild scarring at the left lung base suspected, otherwise Allowing for portable technique the lungs are clear. Postoperative changes to both shoulders in the cervical spine. IMPRESSION: No acute cardiopulmonary abnormality. Electronically Signed   By: Genevie Ann M.D.   On: 02/08/2019 10:51   Chest Port 1 View  Result Date: 01/17/2019 CLINICAL DATA:  Code stroke.  Cough for 1 day. EXAM: PORTABLE CHEST 1 VIEW COMPARISON:  07/05/2015 FINDINGS: The cardiomediastinal silhouette is unremarkable. Minimal LEFT basilar scarring again noted. Monitor/recording device overlying the LEFT chest noted. There is no evidence of focal airspace disease, pulmonary edema, suspicious pulmonary nodule/mass, pleural effusion, or pneumothorax. No acute bony abnormalities are identified. RIGHT humeral surgical hardware again identified. IMPRESSION: No acute abnormality. Electronically Signed   By: Margarette Canada M.D.   On: 01/17/2019 16:00   Ct Head Code Stroke Wo Contrast  Result Date: 02/08/2019 CLINICAL DATA:  Code stroke.  Flaccid left side EXAM: CT HEAD WITHOUT CONTRAST TECHNIQUE: Contiguous axial images were obtained from the base of the skull through the vertex without intravenous contrast. COMPARISON:  01/17/2019 FINDINGS: Brain: Cytotoxic edema in the left insula that is new from prior. 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 Given the interval this is presumably at least partially from mineralization. Small remote anterior and high left frontal cortex infarct. Extensive small vessel  ischemic gliosis in the cerebral white matter. Vascular: Hyperdense left M3 branch on coronal reformats, chronic. Skull: Negative Sinuses/Orbits: Negative Other: These results were called by telephone at the time of interpretation on 02/08/2019 at 10:23 am to Dr. Leonel Ramsay , who verbally acknowledged these results. ASPECTS [facility] Stroke Program Early CT Score) Not scored given the infarcts of various ages. IMPRESSION: 1. Small acute infarct in the left insula. 2. 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 Given the interval there is likely also developing mineralization. 3. Advanced chronic small vessel ischemia. Electronically Signed   By: Monte Fantasia M.D.   On: 02/08/2019 10:25   Ct Head Code Stroke Wo Contrast  Result Date: 01/17/2019 CLINICAL DATA:  Code stroke. Difficulty with speech. History of stroke. EXAM: CT HEAD WITHOUT CONTRAST TECHNIQUE: Contiguous axial images were obtained from the base of the skull through the vertex without intravenous contrast. COMPARISON:  CT head 10/01/2018.  MRI head 09/18/2018 FINDINGS: Brain: Extension of hemorrhagic infarct in the left parietal lobe compared with the prior study. The hemorrhagic infarction is in the exact same location but is more prominent in size on today's study. No other areas of acute infarct Extensive chronic ischemic changes throughout the white matter. Chronic infarct left frontal lobe as noted previously. Mild atrophy without hydrocephalus. No mass or midline shift. Vascular: Negative for hyperdense vessel Skull: Negative Sinuses/Orbits: Mild mucosal edema in the ethmoid sinuses. Bilateral cataract surgery. Other: None ASPECTS ([place] Stroke Program Early CT Score) Aspect score not applicable.  Hemorrhagic infarct on the left as above. IMPRESSION: 1. 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 This has progressed since the  prior CT of 10/01/2018. Differential diagnosis includes recurrent emboli, intracranial stenosis, cerebral amyloid. 2. These results were called by telephone at the time of interpretation on 01/17/2019 at 3:26 pm to Dr. Rory Percy, who verbally acknowledged these results. Electronically Signed   By: Franchot Gallo M.D.   On: 01/17/2019 15:27    EKG: NSR 66 bpm, PR interval appears ~ 200 (personally reviewed)  TELEMETRY: Sinus brady in the low 50s, occasionally 48-49 bpm (personally reviewed)  DEVICE HISTORY: ILR  Implanted 09/20/2018 for cryptogenic stroke  Assessment/Plan: 1.  Bradycardia - ? Pt reportedly has had transient low rhythms as low as the mid 30s and 40. He is overall maintaining in the low 60s. He had two 2 second pauses yesterday afternoon. He was awake and asymptomatic at the time. No heart block. PR interval ~200 ms. No AV nodal blockade on board.   ILR interrogated personally. Pt has no episodes correlated with his syncopal episode. Though pause detection turned off 02/01/2019 due to undersensing (R waves 0.12 - 0.16 mV). Heart rate histograms show occasional rates <40, but this again may be affected by his undersensing.  2. Recurrent CVAs - Per primary and neurology. Suspect his episode was neurogenic.  3. OSA - Compliant with his CPAP.    For questions or updates, please contact [place] Please consult [email or web address] for contact info under Cardiology/STEMI.  Signed, Shirley Friar, PA-C  02/09/2019 10:02 AM      I have seen and examined this patient with Oda Kilts.  Agree with above, note added to reflect my findings.  On exam, RRR, no murmurs, lungs clear.  Patient presented to hospital after an episode of syncope.  His episode of syncope occurred when he was walking on flat ground.  He was he walks 2 miles a day.  He says approximately 1 mile in, he had an episode of syncope without warning signs.  Bystanders got him up and moved him to a bench.  On the way  to the bench, he had a second episode of syncope.  On presentation to the emergency room was found that he had had a CVA as well.  Unfortunately bradycardia and pauses monitoring have been turned off on his Linq monitor.  He did not have any episodes of atrial fibrillation.  We Kadejah Sandiford fit him with a 30-day monitor to wear as an outpatient.  Jaquila Santelli M. Roisin Mones MD 02/09/2019 12:55 PM

## 2019-02-09 NOTE — Progress Notes (Signed)
EEG complete - results pending 

## 2019-02-09 NOTE — Evaluation (Signed)
Occupational Therapy Evaluation and Discharge Patient Details Name: Andrew Cobb MRN: [id number] DOB: [birth year] Today's Date: 02/09/2019    History of Present Illness 68 y.o. male with medical history significant of recent hemorrhagic stroke, hyperlipidemia, diabetes type 2, gastroesophageal reflux disease, and asthma who came to the emergency department 02/08/2019 following a syncopal episode, fall, with associated new onset of left arm numbness and a speech change.   Clinical Impression   Pt admitted with above and presents to OT back to baseline with self-care tasks.  Pt reports having fall and numbness immediately post syncopal episode with fall, however numbness has resolved.  Pt completed short distance ambulation and simulated self-care tasks at overall Mod I level.  Pt expressing desire to find out why this keeps happening.  Otherwise reports back to baseline function.  Pt has no further OT needs, will sign off at this time.    Follow Up Recommendations  No OT follow up    Equipment Recommendations  None recommended by OT       Precautions / Restrictions Precautions Precautions: Fall      Mobility Bed Mobility Overal bed mobility: Modified Independent             General bed mobility comments: use of bed rail and HOB elevated  Transfers Overall transfer level: Modified independent Equipment used: None Transfers: Sit to/from [facility] Sit to Stand: Modified independent (Device/Increase time) Stand pivot transfers: Modified independent (Device/Increase time)                ADL either performed or assessed with clinical judgement   ADL Overall ADL's : At baseline                                       General ADL Comments: Pt at baseline for short distance ambulation and mobility in room, able to complete simulated self-care tasks at baseline     Vision Baseline Vision/History: Wears glasses(hx of cataract  surgery) Wears Glasses: Reading only Patient Visual Report: No change from baseline Vision Assessment?: No apparent visual deficits            Pertinent Vitals/Pain Pain Assessment: No/denies pain     Hand Dominance Left   Extremity/Trunk Assessment Upper Extremity Assessment Upper Extremity Assessment: Overall WFL for tasks assessed(limited shoulder flexion due to hx of shoulder surgery)           Communication Communication Communication: Expressive difficulties;HOH   Cognition Arousal/Alertness: Awake/alert Behavior During Therapy: WFL for tasks assessed/performed Overall Cognitive Status: Within Functional Limits for tasks assessed                                 General Comments: expressive aphasia              Home Living Family/patient expects to be discharged to:: Private residence Living Arrangements: Spouse/significant other Available Help at Discharge: Family;Available 24 hours/day Type of Home: House Home Access: Stairs to enter [employer] of Steps: 2   Home Layout: One level     Bathroom Shower/Tub: Occupational psychologist: Handicapped height     Home Equipment: Environmental consultant - 2 wheels;Shower seat;Walker - 4 wheels   Additional Comments: works as Web designer during holidays and works for [employer] as a Geophysicist/field seismologist to [employer] With: [employer]  Prior Functioning/Environment Level of Independence: Independent        Comments: 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 Loves to sing at church                 OT Goals(Current goals can be found in the care plan section) Acute Rehab OT Goals Patient Stated Goal: to figure out why this keeps happening OT Goal Formulation: All assessment and education complete, DC therapy   AM-PAC OT "6 Clicks" Daily Activity     Outcome Measure Help from another person eating meals?: None Help from  another person taking care of personal grooming?: None Help from another person toileting, which includes using toliet, bedpan, or urinal?: None Help from another person bathing (including washing, rinsing, drying)?: None Help from another person to put on and taking off regular upper body clothing?: None Help from another person to put on and taking off regular lower body clothing?: None 6 Click Score: 24   End of Session Equipment Utilized During Treatment: Gait belt Nurse Communication: Mobility status  Activity Tolerance: Patient tolerated treatment well Patient left: in chair;with call bell/phone within reach;with nursing/sitter in room;with chair alarm set  OT Visit Diagnosis: Unsteadiness on feet (R26.81);History of falling (Z91.81)                Time: 0938-1829 OT Time Calculation (min): 28 min Charges:  OT General Charges $OT Visit: 1 Visit OT Evaluation $OT Eval Moderate Complexity: 1 Mod OT Treatments $Self Care/Home Management : 8-22 mins   Simonne Come, [phone] 02/09/2019, 8:47 AM

## 2019-02-09 NOTE — Progress Notes (Signed)
PROGRESS NOTE    Andrew Cobb   HQI:[id number]  DOB: May 28, 1951  DOA: 02/08/2019 PCP: Guadalupe Maple, MD   Brief Narrative:  Andrew Cobb is a 68 y.o. male with medical history significant of recent hemorrhagic stroke, hyperlipidemia, diabetes type 2, gastroesophageal reflux disease, and asthma who came to the emergency department today following two syncopal episodes while walking with associated new onset of left arm numbness and a speech change.   In July the patient had a intracerebral hemorrhage was discharged from the hospital on July 15 with only mild speech deficits. CTA of head and neck were obtained and showed an acute M3 segment narrowing that was not present 3 weeks ago.  It is very distal and no intervention is indicated.  Patient was seen by Dr. Kathrynn Speed of neurology who recommended admission for acute extension of prior stroke.  He was also noted to be bradycardic with HR in 30-40 range. Cardiology was consulted to evaluate his loop recorder.   Subjective: No complaints. No new neurological symptoms    Assessment & Plan:   Principal Problem: Syncope x 2- left arm numbness, dizziness, bradycardia - numbness and dizziness resolved - HR remains low40s and 50s- not on any rate controlling agents - in regards to syncope, the cardiology team has consulted EP and upon review of the the loop recorder, no arrhythmias have been found- per EP note, "bradyacardia and pauses monitoring have been turned off on his linq monitor" - he is recommended to have a 30 day event monitor which they will arrange - Neuro has ordered an EEG to r/o seizure  Active Problems:   New Left MCA branch infarct (subacute) with h/o CVA in 09/18/18, 10/01/18 and 01/18/19 - CT head> Small acute infarct in the left insula.  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  - CTA head/ neck> L M3 occlusion is new when compared to July - neurological changes from 8/4 have  resolved and he is at baseline - MRI ordered and pending - LDL 42 - A1c 7.7 - Neuro recommendations > ASA daily for now- he is also on Lipitor - PT > outpt PT - not OT needed     Uncontrolled type 2 diabetes mellitus with hyperglycemia, without long-term current use of insulin - Metformin currently on hold- cont SSI     Coronary artery disease involving native coronary artery of native heart without angina pectoris - has right coronary artery stent      Abrasion of knee, bilateral  - due to fall during syncope- cont dressings   Time spent in minutes: 35 min- discussed plan with Dr Leonie Man DVT prophylaxis: SCDs Code Status: DNR Family Communication:  Disposition Plan: home in 1-2 days Consultants:   Neuro  Cardiology  Procedures:    Antimicrobials:  Anti-infectives (From admission, onward)   Start     Dose/Rate Route Frequency Ordered Stop   02/08/19 2200  valACYclovir (VALTREX) tablet 500 mg     500 mg Oral Daily at bedtime 02/08/19 1510         Objective: Vitals:   02/08/19 2321 02/09/19 0300 02/09/19 0800 02/09/19 1146  BP: (!) 141/77 129/67 (!) 144/88 (!) 153/102  Pulse: (!) 53 (!) 59 (!) 50 74  Resp: 18 18 18 18   Temp: 98 F (36.7 C) 98.3 F (36.8 C) 98.6 F (37 C) 98 F (36.7 C)  TempSrc: Oral Oral Oral Axillary  SpO2: 99% 99% 99% 94%  Weight:  Height:        Intake/Output Summary (Last 24 hours) at 02/09/2019 1615 Last data filed at 02/09/2019 1003 Gross per 24 hour  Intake 1176.34 ml  Output 1450 ml  Net -273.66 ml   Filed Weights   02/08/19 1142  Weight: 90.7 kg    Examination: General exam: Appears comfortable  HEENT: PERRLA, oral mucosa moist, no sclera icterus or thrush Respiratory system: Clear to auscultation. Respiratory effort normal. Cardiovascular system: S1 & S2 heard, RRR.   Gastrointestinal system: Abdomen soft, non-tender, nondistended. Normal bowel sounds. Central nervous system: Alert and oriented- has dysarthria.    Extremities: No cyanosis, clubbing or edema Skin: No rashes or ulcers Psychiatry:  Mood & affect appropriate.     Data Reviewed: I have personally reviewed following labs and imaging studies  CBC: Recent Labs  Lab 02/08/19 1000 02/08/19 1013  WBC 4.1  --   NEUTROABS 2.5  --   HGB 14.0 14.6  HCT 43.8 43.0  MCV 98.2  --   PLT 144*  --    Basic Metabolic Panel: Recent Labs  Lab 02/08/19 1000 02/08/19 1013 02/09/19 0458  NA 137 137  --   K 4.3 4.4  --   CL 101 99  --   CO2 23  --   --   GLUCOSE 321* 310*  --   BUN 20 24*  --   CREATININE 1.27* 1.10  --   CALCIUM 9.2  --   --   MG  --   --  2.1   GFR: Estimated Creatinine Clearance: 72.8 mL/min (by C-G formula based on SCr of 1.1 mg/dL). Liver Function Tests: Recent Labs  Lab 02/08/19 1000  AST 29  ALT 37  ALKPHOS 62  BILITOT 1.1  PROT 6.5  ALBUMIN 4.1   No results for input(s): LIPASE, AMYLASE in the last 168 hours. No results for input(s): AMMONIA in the last 168 hours. Coagulation Profile: Recent Labs  Lab 02/08/19 1000  INR 1.1   Cardiac Enzymes: No results for input(s): CKTOTAL, CKMB, CKMBINDEX, TROPONINI in the last 168 hours. BNP (last 3 results) No results for input(s): PROBNP in the last 8760 hours. HbA1C: Recent Labs    02/09/19 0458  HGBA1C 7.7*   CBG: Recent Labs  Lab 02/08/19 1007 02/08/19 1728 02/08/19 2108 02/09/19 0601 02/09/19 1143  GLUCAP 289* 102* 151* 84 184*   Lipid Profile: Recent Labs    02/09/19 0458  CHOL 104  HDL 47  LDLCALC 42  TRIG 77  CHOLHDL 2.2   Thyroid Function Tests: Recent Labs    02/09/19 0458  TSH 2.438   Anemia Panel: No results for input(s): VITAMINB12, FOLATE, FERRITIN, TIBC, IRON, RETICCTPCT in the last 72 hours. Urine analysis:    Component Value Date/Time   COLORURINE STRAW (A) 02/08/2019 1720   APPEARANCEUR CLEAR 02/08/2019 1720   APPEARANCEUR Clear 07/21/2017 0843   LABSPEC 1.031 (H) 02/08/2019 1720   PHURINE 8.0 02/08/2019  1720   GLUCOSEU NEGATIVE 02/08/2019 1720   HGBUR NEGATIVE 02/08/2019 1720   BILIRUBINUR NEGATIVE 02/08/2019 1720   BILIRUBINUR Negative 07/21/2017 0843   KETONESUR NEGATIVE 02/08/2019 1720   PROTEINUR NEGATIVE 02/08/2019 1720   UROBILINOGEN 0.2 07/11/2012 1443   NITRITE NEGATIVE 02/08/2019 1720   LEUKOCYTESUR NEGATIVE 02/08/2019 1720   Sepsis Labs: @LABRCNTIP (procalcitonin:4,lacticidven:4) ) Recent Results (from the past 240 hour(s))  SARS CORONAVIRUS 2 Nasal Swab Aptima Multi Swab     Status: None   Collection Time: 02/08/19 10:17 AM  Specimen: Aptima Multi Swab; Nasal Swab  Result Value Ref Range Status   SARS Coronavirus 2 NEGATIVE NEGATIVE Final    Comment: (NOTE) SARS-CoV-2 target nucleic acids are NOT DETECTED. The SARS-CoV-2 RNA is generally detectable in upper and lower respiratory specimens during the acute phase of infection. Negative results do not preclude SARS-CoV-2 infection, do not rule out co-infections with other pathogens, and should not be used as the sole basis for treatment or other patient management decisions. Negative results must be combined with clinical observations, patient history, and epidemiological information. The expected result is Negative. Fact Sheet for Patients: SugarRoll.be Fact Sheet for Healthcare Providers: https://www.woods-mathews.com/ This test is not yet approved or cleared by the [place] FDA and  has been authorized for detection and/or diagnosis of SARS-CoV-2 by FDA under an Emergency Use Authorization (EUA). This EUA will remain  in effect (meaning this test can be used) for the duration of the COVID-19 declaration under Section 56 4(b)(1) of the Act, 21 U.S.C. section 360bbb-3(b)(1), unless the authorization is terminated or revoked sooner. Performed at [facility], [place] [address] [place], [place] [address]          Radiology Studies: Ct Angio Head W Or Wo  Contrast  Result Date: 02/08/2019 CLINICAL DATA:  Weakness. EXAM: CT ANGIOGRAPHY HEAD AND NECK CT PERFUSION BRAIN TECHNIQUE: Multidetector CT imaging of the head and neck was performed using the standard protocol during bolus administration of intravenous contrast. Multiplanar CT image reconstructions and MIPs were obtained to evaluate the vascular anatomy. Carotid stenosis measurements (when applicable) are obtained utilizing NASCET criteria, using the distal internal carotid diameter as the denominator. Multiphase CT imaging of the brain was performed following IV bolus contrast injection. Subsequent parametric perfusion maps were calculated using RAPID software. CONTRAST:  Reference EMR COMPARISON:  01/18/2019 CTA head neck. FINDINGS: CTA NECK FINDINGS Aortic arch: 2 vessel branching. Right carotid system: Atherosclerotic plaque along the common carotid and proximal ICA without flow limiting stenosis or ulceration in the proximal ICA. There is ECA origin stenosis. Left carotid system: Moderate mixed density plaque at the bifurcation without flow limiting stenosis or ulceration. Vertebral arteries: Proximal subclavian atherosclerosis without ulceration or flow limiting stenosis. Negative for stenosis, beading, or ulceration. 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 No acute or aggressive finding Other neck: No significant incidental finding Upper chest: Negative Review of the MIP images confirms the above findings CTA HEAD FINDINGS Anterior circulation: Left M3 branch occlusion, new from prior but correlating with the known left cerebral infarcts. No large vessel occlusion. There is mild atherosclerotic plaque on the carotid siphons. Negative for aneurysm. Posterior circulation: The vertebral and basilar arteries are smooth and widely patent. No branch occlusion or flow limiting stenosis. Venous sinuses: Patent Anatomic variants: Negative Review of the MIP images confirms the above findings  CT Brain Perfusion Findings: CBF (<30%) Volume: 35mL Perfusion (Tmax>6.0s) volume: 59mL Mismatch Volume: 35mL These results were called by telephone at the time of interpretation on 02/08/2019 at 10:47 am to Dr. Roland Rack , who verbally acknowledged these results. IMPRESSION: 1. Left M3 occlusion that is new from 01/18/2019 CTA but correlates with the now chronic and subacute infarct seen on the July 2020 imaging. Cerebral perfusion suggests peri-infarct penumbra measuring 19 cc. 2. Atherosclerosis without flow limiting stenosis or evident embolic source. Electronically Signed   By: Monte Fantasia M.D.   On: 02/08/2019 10:50   Ct Angio Neck W Or Wo Contrast  Result Date: 02/08/2019 CLINICAL DATA:  Weakness. EXAM: CT ANGIOGRAPHY HEAD AND NECK CT PERFUSION BRAIN TECHNIQUE: Multidetector CT imaging of the head and neck was performed using the standard protocol during bolus administration of intravenous contrast. Multiplanar CT image reconstructions and MIPs were obtained to evaluate the vascular anatomy. Carotid stenosis measurements (when applicable) are obtained utilizing NASCET criteria, using the distal internal carotid diameter as the denominator. Multiphase CT imaging of the brain was performed following IV bolus contrast injection. Subsequent parametric perfusion maps were calculated using RAPID software. CONTRAST:  Reference EMR COMPARISON:  01/18/2019 CTA head neck. FINDINGS: CTA NECK FINDINGS Aortic arch: 2 vessel branching. Right carotid system: Atherosclerotic plaque along the common carotid and proximal ICA without flow limiting stenosis or ulceration in the proximal ICA. There is ECA origin stenosis. Left carotid system: Moderate mixed density plaque at the bifurcation without flow limiting stenosis or ulceration. Vertebral arteries: Proximal subclavian atherosclerosis without ulceration or flow limiting stenosis. Negative for stenosis, beading, or ulceration. Skeleton: Extensive cervical  spine fusion with C2-3 ankylosis. No acute or aggressive finding Other neck: No significant incidental finding Upper chest: Negative Review of the MIP images confirms the above findings CTA HEAD FINDINGS Anterior circulation: Left M3 branch occlusion, new from prior but correlating with the known left cerebral infarcts. No large vessel occlusion. There is mild atherosclerotic plaque on the carotid siphons. Negative for aneurysm. Posterior circulation: The vertebral and basilar arteries are smooth and widely patent. No branch occlusion or flow limiting stenosis. Venous sinuses: Patent Anatomic variants: Negative Review of the MIP images confirms the above findings CT Brain Perfusion Findings: CBF (<30%) Volume: 55mL Perfusion (Tmax>6.0s) volume: 48mL Mismatch Volume: 52mL These results were called by telephone at the time of interpretation on 02/08/2019 at 10:47 am to Dr. Roland Rack , who verbally acknowledged these results. IMPRESSION: 1. Left M3 occlusion that is new from 01/18/2019 CTA but correlates with the now chronic and subacute infarct seen on the July 2020 imaging. Cerebral perfusion suggests peri-infarct penumbra measuring 19 cc. 2. Atherosclerosis without flow limiting stenosis or evident embolic source. Electronically Signed   By: Monte Fantasia M.D.   On: 02/08/2019 10:50   Ct Cerebral Perfusion W Contrast  Result Date: 02/08/2019 CLINICAL DATA:  Weakness. EXAM: CT ANGIOGRAPHY HEAD AND NECK CT PERFUSION BRAIN TECHNIQUE: Multidetector CT imaging of the head and neck was performed using the standard protocol during bolus administration of intravenous contrast. Multiplanar CT image reconstructions and MIPs were obtained to evaluate the vascular anatomy. Carotid stenosis measurements (when applicable) are obtained utilizing NASCET criteria, using the distal internal carotid diameter as the denominator. Multiphase CT imaging of the brain was performed following IV bolus contrast injection.  Subsequent parametric perfusion maps were calculated using RAPID software. CONTRAST:  Reference EMR COMPARISON:  01/18/2019 CTA head neck. FINDINGS: CTA NECK FINDINGS Aortic arch: 2 vessel branching. Right carotid system: Atherosclerotic plaque along the common carotid and proximal ICA without flow limiting stenosis or ulceration in the proximal ICA. There is ECA origin stenosis. Left carotid system: Moderate mixed density plaque at the bifurcation without flow limiting stenosis or ulceration. Vertebral arteries: Proximal subclavian atherosclerosis without ulceration or flow limiting stenosis. Negative for stenosis, beading, or ulceration. 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 No acute or aggressive finding Other neck: No significant incidental finding Upper chest: Negative Review of the MIP images confirms the above findings CTA HEAD FINDINGS Anterior circulation: Left M3 branch occlusion, new from prior but correlating with the known left cerebral infarcts. No large vessel occlusion. There  is mild atherosclerotic plaque on the carotid siphons. Negative for aneurysm. Posterior circulation: The vertebral and basilar arteries are smooth and widely patent. No branch occlusion or flow limiting stenosis. Venous sinuses: Patent Anatomic variants: Negative Review of the MIP images confirms the above findings CT Brain Perfusion Findings: CBF (<30%) Volume: 11mL Perfusion (Tmax>6.0s) volume: 31mL Mismatch Volume: 78mL These results were called by telephone at the time of interpretation on 02/08/2019 at 10:47 am to Dr. Roland Rack , who verbally acknowledged these results. IMPRESSION: 1. Left M3 occlusion that is new from 01/18/2019 CTA but correlates with the now chronic and subacute infarct seen on the July 2020 imaging. Cerebral perfusion suggests peri-infarct penumbra measuring 19 cc. 2. Atherosclerosis without flow limiting stenosis or evident embolic source. Electronically Signed   By:  Monte Fantasia M.D.   On: 02/08/2019 10:50   Dg Chest Portable 1 View  Result Date: 02/08/2019 CLINICAL DATA:  68 year old male status post syncope at 0830 hours. EXAM: PORTABLE CHEST 1 VIEW COMPARISON:  01/17/2019 and earlier. FINDINGS: Portable AP semi upright view at 1026 hours. Stable left chest cardiac event recorder. Lung volumes and mediastinal contours are stable and within normal limits. Visualized tracheal air column is within normal limits. Mild scarring at the left lung base suspected, otherwise Allowing for portable technique the lungs are clear. Postoperative changes to both shoulders in the cervical spine. IMPRESSION: No acute cardiopulmonary abnormality. Electronically Signed   By: Genevie Ann M.D.   On: 02/08/2019 10:51   Ct Head Code Stroke Wo Contrast  Result Date: 02/08/2019 CLINICAL DATA:  Code stroke.  Flaccid left side EXAM: CT HEAD WITHOUT CONTRAST TECHNIQUE: Contiguous axial images were obtained from the base of the skull through the vertex without intravenous contrast. COMPARISON:  01/17/2019 FINDINGS: Brain: Cytotoxic edema in the left insula that is new from prior. 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 Given the interval this is presumably at least partially from mineralization. Small remote anterior and high left frontal cortex infarct. Extensive small vessel ischemic gliosis in the cerebral white matter. Vascular: Hyperdense left M3 branch on coronal reformats, chronic. Skull: Negative Sinuses/Orbits: Negative Other: These results were called by telephone at the time of interpretation on 02/08/2019 at 10:23 am to Dr. Leonel Ramsay , who verbally acknowledged these results. ASPECTS [facility] Stroke Program Early CT Score) Not scored given the infarcts of various ages. IMPRESSION: 1. Small acute infarct in the left insula. 2. 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 Given the interval there is likely also developing mineralization. 3. Advanced chronic small vessel ischemia. Electronically Signed   By: Monte Fantasia M.D.   On: 02/08/2019 10:25      Scheduled Meds: . amitriptyline  25 mg Oral QHS  . aspirin EC  81 mg Oral Daily  . atorvastatin  20 mg Oral QHS  . benazepril  40 mg Oral Daily  . cholecalciferol  2,000 Units Oral Daily  . insulin aspart  0-15 Units Subcutaneous TID WC  . insulin aspart  0-5 Units Subcutaneous QHS  . loratadine  10 mg Oral Daily  . magnesium oxide  200 mg Oral QHS  . multivitamin with minerals  1 tablet Oral Daily  . neomycin-bacitracin-polymyxin   Topical Daily  . sodium chloride flush  3 mL Intravenous Once  . valACYclovir  500 mg Oral QHS   Continuous Infusions:   LOS:  1 day      Debbe Odea, MD [facility] Pager: [email or web address] Password [email or web address] 02/09/2019, 4:15 PM
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
## 2019-02-09 NOTE — Procedures (Signed)
Patient Name: Andrew Cobb  MRN: [id number]  Epilepsy Attending: Lora Havens  Referring Physician/Provider: Burnetta Sabin, NP Date: 02/09/19 Duration: 25.43 mins  Patient history: 68yo M with left sided strokes. EEG to evaluate for seizures.   Level of alertness: awake, asleep  Technical aspects: This EEG study was done with scalp electrodes positioned according to the 10-20 International system of electrode placement. Electrical activity was acquired at a sampling rate of 500Hz  and reviewed with a high frequency filter of 70Hz  and a low frequency filter of 1Hz . EEG data were recorded continuously and digitally stored.   DESCRIPTION: The posterior dominant rhythm consists of 8-9 Hz activity of moderate voltage (25-35 uV) seen predominantly in posterior head regions, symmetric and reactive to eye opening and eye closing. Sleep was characterized by vertex waves and sleep spindles, maximal frontocentral. Intermittent 2-3hz  slowing was seen in left frontotemporal region. Sharp transients were seen in right parieto-temporal region.  Hyperventilation and photic stimulation were not performed.  IMPRESSION: This study is suggestive of cortical dysfunction in left frontotemporal region. No seizures or clear epileptiform discharges were seen throughout the recording.   Fontaine Hehl Barbra Sarks

## 2019-02-09 NOTE — Evaluation (Signed)
Speech Language Pathology Evaluation Patient Details Name: Andrew Cobb MRN: [id number] DOB: 05-20-1951 Today's Date: 02/09/2019 Time: 1152-1209 SLP Time Calculation (min) (ACUTE ONLY): 17 min  Problem List:  Patient Active Problem List   Diagnosis Date Noted  . Chest pain 02/08/2019  . Left arm numbness 02/08/2019  . Abrasion of knee, bilateral 02/08/2019  . Bradycardia with 31-40 beats per minute 02/08/2019  . Diabetes mellitus without complication ([place])   . Chronic venous insufficiency   . Cancer ([place])   . Asthma   . Arthritis   . GERD (gastroesophageal reflux disease)   . Hypogonadism in male   . MRSA (methicillin resistant Staphylococcus aureus) infection   . Sleep apnea   . Stented coronary artery   . Stroke ([place])   . Thrombocythemia ([place])   . Bradycardia   . 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 Acute ischemic left MCA stroke ([place]) 09/18/2018  . Coronary artery disease involving native coronary artery of native heart without angina pectoris   . H/O right coronary artery stent placement   . Allergic rhinitis 08/19/2018  . Cervical radiculopathy 08/17/2017  . Advanced care planning/counseling discussion 07/21/2017  . Skin lesion of right arm 12/16/2016  . Prostate cancer ([place]) 12/08/2016  . H/O bariatric surgery 07/17/2016  . Essential hypertension 04/24/2015  . Uncontrolled type 2 diabetes mellitus with hyperglycemia, without long-term current use of insulin ([place]) 04/24/2015  . Chronic sinusitis 08/13/2009  . Hyperlipidemia 04/02/2009  . Coronary atherosclerosis 04/02/2009  . 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 OSA (obstructive sleep apnea) 04/02/2009  . Arthritis of shoulder region, degenerative 04/02/2009  . Coronary artery disease 1996   Past Medical History:  Past Medical History:  Diagnosis Date  . Arthritis   . Asthma   . Cancer [facility])     prostate  . Chronic venous insufficiency    varicose vein lower extremity with inflammation  . Coronary artery disease 1996   two stents placed   . Diabetes mellitus without complication ([place])    type 2 on metformin  . GERD (gastroesophageal reflux disease)    no issues since gastric bypass surgery as stated per pt  . Hyperlipidemia   . Hypogonadism in male   . MRSA (methicillin resistant Staphylococcus aureus) infection    07/30/2008 thru 08/07/2008  . Sleep apnea    on BIPAP  . Stented coronary artery   . Stroke ([place])   . Thrombocythemia [facility])    Past Surgical History:  Past Surgical History:  Procedure Laterality Date  . ANGIOPLASTY    . ANGIOPLASTY     with stent 04/07/1995  . ANTERIOR CERVICAL DECOMPRESSION/DISCECTOMY FUSION 4 LEVELS N/A 08/17/2017   Procedure: Anterior discectomy with fusion and plate fixation Cervical Three-Four, Four-Five, Five-Six, and Six-Seven Fusion;  Surgeon: Ditty, Kevan Ny, MD;  Location: [place];  Service: Neurosurgery;  Laterality: N/A;  Anterior discectomy with fusion and plate fixation Cervical Three-Four, Four-Five, Five-Six, and Six-Seven Fusion   . APPENDECTOMY     1966  . BUBBLE STUDY  09/20/2018   Procedure: BUBBLE STUDY;  Surgeon: Josue Hector, MD;  Location: [place];  Service: Cardiovascular;;  . [place]  . CARDIOVASCULAR STRESS TEST     07/31/2011  . CARPAL TUNNEL RELEASE Left   . CHOLECYSTECTOMY     2006  . colonscopy      08/25/2012  . EYE SURGERY Bilateral  cataract  . FUNCTIONAL ENDOSCOPIC SINUS SURGERY     11/10/2013  . GASTRIC BYPASS     10/05/2012  . HERNIA REPAIR     left inguinal 1981  . INCISION AND DRAINAGE ABSCESS Right 02/26/2017   Procedure: INCISION AND DRAINAGE ABSCESS;  Surgeon: Nickie Retort, MD;  Location: [facility];  Service: Urology;  Laterality: Right;  . JOINT REPLACEMENT     bilateral  . left ankle surgery      05/03/2003   . left carpel tunnel      09/18/1993  .  left knee meniscal tear      01/25/2010  . left knee meniscal tear repair      05/04/1996  . left rotator cuff repair      05/03/2003   . LOOP RECORDER INSERTION N/A 09/20/2018   Procedure: LOOP RECORDER INSERTION;  Surgeon: Evans Lance, MD;  Location: [place] [facility];  Service: Cardiovascular;  Laterality: N/A;  . REPLACEMENT TOTAL KNEE BILATERAL  07/13/2015  . right ankle surgery      fracture has 2 screws 07/07/1997  . right carpel tunnel      05/16/1992  . right shoulder replacement      01/27/2006  . SCROTAL EXPLORATION Right 02/26/2017   Procedure: SCROTUM EXPLORATION;  Surgeon: Nickie Retort, MD;  Location: [facility];  Service: Urology;  Laterality: Right;  . TEE WITHOUT CARDIOVERSION N/A 09/20/2018   Procedure: TRANSESOPHAGEAL ECHOCARDIOGRAM (TEE);  Surgeon: Josue Hector, MD;  Location[facility];  Service: Cardiovascular;  Laterality: N/A;  . TOTAL KNEE ARTHROPLASTY Left 07/13/2015   Procedure: LEFT TOTAL KNEE ARTHROPLASTY;  Surgeon: Gaynelle Arabian, MD;  Location: [facility];  Service: Orthopedics;  Laterality: Left;   HPI:  Pt is a 68 y.o. male with medical history significant of recent hemorrhagic stroke, hyperlipidemia, diabetes type 2, gastroesophageal reflux disease, and asthma who came to the ED following a syncopal episode with associated new onset of left arm numbness and a speech change. CT of the head showed small acute infarct in the left insula. 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 with persistent high-density at site of prior petechial hemorrhage.    Assessment / Plan / Recommendation Clinical Impression  Pt is known to speech pathology and participated in a speech/language/cognitive evaluation on 01/18/19 during his prior admission for a CVA and he presented with moderate expressive aphasia at that time. He reported today that his language skills were improving and he was receiving outpatient SLP services at [facility]. However, he has noticed an acute decline in verbal expression.   Today's evaluation revealed improved receptive and expressive language skills compared to his last evaluation at this facility on 01/18/19. He currently presents with mild to moderate expressive aphasia. Pt was able to follow 2-step and some 3-step directions, and answer yes/no questions related to a simple paragraph. However, he exhibited difficulty with auditory comprehension of more complex information and with reading comprehension at the paragraph level. Pt communicated at the sentence level during the evaluation with word retrieval difficulty and intermittent neologistic errors but he was able to accurately complete structured naming tasks. Articulatory precision was minimally reduced but speech was intelligible. Skilled SLP services are clinically indicated at this time to improve aphasia.     SLP Assessment  SLP Recommendation/Assessment: Patient needs continued Speech Lanaguage Pathology Services SLP Visit Diagnosis: Aphasia (R47.01)    Follow Up Recommendations  Other (comment)(Continued SLP services at level of care recommended  by PT/OT)    Frequency and Duration min 2x/week  2 weeks      SLP Evaluation Cognition  Overall Cognitive Status: Difficult to assess (Due to receptive and expressive language deficits)      Comprehension  Auditory Comprehension Overall Auditory Comprehension: Impaired Basic Immediate Environment Questions: (5/5) Complex Questions: (3/5) Paragraph Comprehension (via yes/no questions): (4/4) Two Step Basic Commands: (4/4) Multistep Basic Commands: (2/4) Conversation: (Moderately complex ) Visual Recognition/Discrimination Discrimination: Within Function Limits Reading Comprehension Word level: Within functional limits Sentence Level: Within functional limits Paragraph Level: Impaired(But improved from last evalaution)    Expression Expression Primary Mode of Expression:  Verbal Verbal Expression Overall Verbal Expression: Impaired Initiation: Impaired Automatic Speech: Counting;Day of week;Month of year(WNL) Repetition: No impairment(5/5) Naming: Impairment Responsive: (5/5) Confrontation: Within functional limits(10/10) Convergent: (5/5) Verbal Errors: Neologisms Pragmatics: No impairment   Oral / Motor  Oral Motor/Sensory Function Overall Oral Motor/Sensory Function: Within functional limits Motor Speech Overall Motor Speech: Appears within functional limits for tasks assessed Respiration: Within functional limits Phonation: Normal Resonance: Within functional limits Articulation: Within functional limitis Intelligibility: Intelligible Motor Planning: Witnin functional limits Motor Speech Errors: Not applicable   Valente Fosberg I. Hardin Negus, [place], [place] Office number [phone] Pager [place] 02/09/2019, 12:44 PM

## 2019-02-09 NOTE — Progress Notes (Signed)
  Speech Language Pathology Treatment: Cognitive-Linquistic(Aphasia )  Patient Details Name: Andrew Cobb MRN: [id number] DOB: [birth year] Today's Date: 02/09/2019 Time: 1210-1230 SLP Time Calculation (min) (ACUTE ONLY): 20 min  Assessment / Plan / Recommendation Clinical Impression  Pt was seen for aphasia treatment and was cooperative throughout the session. He participated in a conversation with the SLP regarding his medical plan and information which he has been provided with mod support for word retrieval. He demonstrated sentence formulation related to single-action photos with 60% accuracy increasing to 100% with min-mod cues. He also required min-mod cues for description of a multi-action picture. With his independent use of gestures and min-mod cues from SLP, he provided 9-15 items per concrete category during divergent naming tasks. SLP will continue to follow pt.      HPI HPI: Pt is a 68 y.o. male with medical history significant of recent hemorrhagic stroke, hyperlipidemia, diabetes type 2, gastroesophageal reflux disease, and asthma who came to the ED following a syncopal episode with associated new onset of left arm numbness and a speech change. CT of the head showed small acute infarct in the left insula. 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 with persistent high-density at site of prior petechial hemorrhage.       SLP Plan     Patient needs continued Speech Lanaguage Pathology Services    Recommendations                   Follow up Recommendations: Other (comment)(Continued SLP services at level of care recommended by PT/OT) SLP Visit Diagnosis: Aphasia (R47.01)       Andrew Cobb, [place], [place] Office number [phone] Pager [place] 02/09/2019, 12:52 PM

## 2019-02-09 NOTE — Progress Notes (Signed)
PT Cancellation Note  Patient Details Name: Andrew Cobb MRN: [id number] DOB: 1950/12/19   Cancelled Treatment:    Reason Eval/Treat Not Completed: Patient at procedure or test/unavailable. Pt is with speech therapy and will return later to reattempt.   Ramond Dial 02/09/2019, 12:46 PM   Mee Hives, PT MS Acute Rehab Dept. Number: [place]

## 2019-02-09 NOTE — Evaluation (Signed)
Physical Therapy Evaluation Patient Details Name: Andrew Cobb MRN: [id number] DOB: [birth year] Today's Date: 02/09/2019   History of Present Illness  68 y.o. male with medical history significant of recent hemorrhagic stroke, hyperlipidemia, diabetes type 2, gastroesophageal reflux disease, and asthma who came to the emergency department 02/08/2019 following a syncopal episode, fall, with associated new onset of left arm numbness and a speech change.  Clinical Impression  Pt is assessed today after admission from syncopal episode and recent CVA.  He is demonstrating good control of standing, reasonable control of standing balance and is still having HR in the 40's to 60 with all mobility.  Pt is min guard for safety with tandem walk, sidestepping B, retropulsion and is unable to stand on one leg due to having fallen on his knees.  Follow for balance skills and monitoring of his HR with gait, and will ask for outpatient therapy to monitor HR with aerobic exercise as tolerated.     Follow Up Recommendations Outpatient PT;Supervision - Intermittent    Equipment Recommendations  None recommended by PT    Recommendations for Other Services       Precautions / Restrictions Precautions Precautions: Fall Precaution Comments: watch HR Restrictions Weight Bearing Restrictions: No      Mobility  Bed Mobility Overal bed mobility: Modified Independent                Transfers Overall transfer level: Modified independent Equipment used: None                Ambulation/Gait Ambulation/Gait assistance: Min guard Gait Distance (Feet): 180 Feet Assistive device: None Gait Pattern/deviations: Step-through pattern;Decreased stride length;Wide base of support Gait velocity: decreased to control for HR Gait velocity interpretation: <1.31 ft/sec, indicative of household ambulator General Gait Details: pt is adjusting gait to manage his balance  Stairs            Wheelchair  Mobility    Modified [name] (Stroke Patients Only) Modified [name] (Stroke Patients Only) Pre-Morbid [name] Score: No significant disability Modified [name]: Moderate disability     Balance Overall balance assessment: Needs assistance;History of Falls Sitting-balance support: Feet supported Sitting balance-Leahy Scale: Good     Standing balance support: Single extremity supported;During functional activity Standing balance-Leahy Scale: Fair                               Pertinent Vitals/Pain Pain Assessment: No/denies pain    Home Living Family/patient expects to be discharged to:: Private residence Living Arrangements: Spouse/significant other Available Help at Discharge: Family;Available 24 hours/day Type of Home: House Home Access: Stairs to enter Entrance Stairs-Rails: (has a R side wall bar) Entrance Stairs-Number of Steps: 2 Home Layout: One level Home Equipment: Walker - 2 wheels;Clinical cytogeneticist - 4 wheels Additional Comments: works as Web designer during holidays and 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 Loves to sing at [place]   Dominant Hand: Left    Extremity/Trunk Assessment   Upper Extremity Assessment Upper Extremity Assessment: Defer to OT evaluation    Lower Extremity Assessment Lower Extremity Assessment: Overall WFL for tasks assessed    Cervical / Trunk Assessment Cervical / Trunk Assessment: Other exceptions  Cervical / Trunk Exceptions: c spine fusion  Communication   Communication: Expressive difficulties;HOH  Cognition Arousal/Alertness: Awake/alert Behavior During Therapy: Impulsive Overall Cognitive Status: Within Functional Limits for tasks assessed                                  General Comments: expressive aphasia      General Comments General comments (skin integrity, edema, etc.): Pt is up walking with min guard for safety, then did cross steps, tandem gait and sidesteps.  Min guard for all for safety    Exercises     Assessment/Plan    PT Assessment Patient needs continued PT services  PT Problem List Decreased balance;Decreased cognition;Decreased mobility       PT Treatment Interventions Gait training;Stair training;Functional mobility training;Therapeutic activities;Therapeutic exercise;Balance training;Neuromuscular re-education;Patient/family education    PT Goals (Current goals can be found in the Care Plan section)  Acute Rehab PT Goals Patient Stated Goal: to go home and feel better PT Goal Formulation: With patient/family Time For Goal Achievement: 02/23/19 Potential to Achieve Goals: Good    Frequency Min 3X/week   Barriers to discharge Inaccessible home environment home with stairs to enter house    Co-evaluation               AM-PAC PT "6 Clicks" Mobility  Outcome Measure Help needed turning from your back to your side while in a flat bed without using bedrails?: None Help needed moving from lying on your back to sitting on the side of a flat bed without using bedrails?: None Help needed moving to and from a bed to a chair (including a wheelchair)?: None Help needed standing up from a chair using your arms (e.g., wheelchair or bedside chair)?: A Little Help needed to walk in hospital room?: A Little Help needed climbing 3-5 steps with a railing? : A Little 6 Click Score: 21    End of Session Equipment Utilized During Treatment: Gait belt Activity Tolerance: Patient tolerated treatment well;Treatment limited secondary to medical complications (Comment) Patient left: in chair;with call bell/phone within reach;with chair alarm set;with family/visitor present Nurse Communication: Mobility status PT Visit Diagnosis:  Unsteadiness on feet (R26.81)    Time: 1610-9604 PT Time Calculation (min) (ACUTE ONLY): 25 min   Charges:   PT Evaluation $PT Eval Moderate Complexity: 1 Mod PT Treatments $Gait Training: 8-22 mins       Ramond Dial 02/09/2019, 2:50 PM   Mee Hives, PT MS Acute Rehab Dept. Number: [place]

## 2019-02-10 ENCOUNTER — Ambulatory Visit: Payer: PPO

## 2019-02-10 ENCOUNTER — Ambulatory Visit (INDEPENDENT_AMBULATORY_CARE_PROVIDER_SITE_OTHER): Payer: PPO | Admitting: *Deleted

## 2019-02-10 ENCOUNTER — Ambulatory Visit: Payer: PPO | Admitting: Speech Pathology

## 2019-02-10 DIAGNOSIS — I10 Essential (primary) hypertension: Secondary | ICD-10-CM

## 2019-02-10 DIAGNOSIS — R55 Syncope and collapse: Secondary | ICD-10-CM

## 2019-02-10 DIAGNOSIS — E1165 Type 2 diabetes mellitus with hyperglycemia: Secondary | ICD-10-CM

## 2019-02-10 DIAGNOSIS — Z955 Presence of coronary angioplasty implant and graft: Secondary | ICD-10-CM

## 2019-02-10 DIAGNOSIS — G4733 Obstructive sleep apnea (adult) (pediatric): Secondary | ICD-10-CM

## 2019-02-10 DIAGNOSIS — S80212A Abrasion, left knee, initial encounter: Secondary | ICD-10-CM

## 2019-02-10 DIAGNOSIS — E782 Mixed hyperlipidemia: Secondary | ICD-10-CM

## 2019-02-10 DIAGNOSIS — I639 Cerebral infarction, unspecified: Secondary | ICD-10-CM | POA: Diagnosis not present

## 2019-02-10 LAB — CUP PACEART REMOTE DEVICE CHECK
Date Time Interrogation Session: 20200806190102
Implantable Pulse Generator Implant Date: 20200316

## 2019-02-10 LAB — GLUCOSE, CAPILLARY
Glucose-Capillary: 137 mg/dL — ABNORMAL HIGH (ref 70–99)
Glucose-Capillary: 189 mg/dL — ABNORMAL HIGH (ref 70–99)

## 2019-02-10 MED ORDER — ASPIRIN 81 MG PO TBEC: 81.0000 mg | DELAYED_RELEASE_TABLET | Freq: Every day | ORAL | 0 refills | Status: DC

## 2019-02-10 NOTE — Plan of Care (Signed)
Plan of care adequate for discharge.

## 2019-02-10 NOTE — Progress Notes (Signed)
STROKE TEAM PROGRESS NOTE   INTERVAL HISTORY Wife at bedside. Patient is stable and ready to go home. Will dischare on asa alone (discussed with Dr. Leonie Man) due to risk bleed with dual antiplatelets (had ICH last month).   Vitals:   02/09/19 2016 02/09/19 2312 02/10/19 0408 02/10/19 0814  BP: (!) 155/83 138/77 114/77 123/87  Pulse: (!) 52 (!) 51 62 60  Resp: 16 15  16   Temp: 97.7 F (36.5 C) 97.7 F (36.5 C) 98.7 F (37.1 C) 97.8 F (36.6 C)  TempSrc: Oral Oral Oral Oral  SpO2: 100% 99% 98% 100%  Weight:      Height:       CBC:  Recent Labs  Lab 02/08/19 1000 02/08/19 1013  WBC 4.1  --   NEUTROABS 2.5  --   HGB 14.0 14.6  HCT 43.8 43.0  MCV 98.2  --   PLT 144*  --    Basic Metabolic Panel:  Recent Labs  Lab 02/08/19 1000 02/08/19 1013 02/09/19 0458  NA 137 137  --   K 4.3 4.4  --   CL 101 99  --   CO2 23  --   --   GLUCOSE 321* 310*  --   BUN 20 24*  --   CREATININE 1.27* 1.10  --   CALCIUM 9.2  --   --   MG  --   --  2.1   Lipid Panel:     Component Value Date/Time   CHOL 104 02/09/2019 0458   CHOL 111 01/25/2018 0815   TRIG 77 02/09/2019 0458   TRIG 150 (H) 01/25/2018 0815   HDL 47 02/09/2019 0458   HDL 47 07/21/2017 1144   CHOLHDL 2.2 02/09/2019 0458   VLDL 15 02/09/2019 0458   VLDL 30 (H) 01/25/2018 0815   LDLCALC 42 02/09/2019 0458   LDLCALC 138 (H) 07/21/2017 1144   HgbA1c:  Lab Results  Component Value Date   HGBA1C 7.7 (H) 02/09/2019   Urine Drug Screen:     Component Value Date/Time   LABOPIA NONE DETECTED 02/08/2019 1722   COCAINSCRNUR NONE DETECTED 02/08/2019 1722   LABBENZ NONE DETECTED 02/08/2019 1722   AMPHETMU NONE DETECTED 02/08/2019 1722   THCU NONE DETECTED 02/08/2019 1722   LABBARB NONE DETECTED 02/08/2019 1722    Alcohol Level     Component Value Date/Time   ETH <10 01/17/2019 1505    IMAGING Mr Brain Wo Contrast  Result Date: 02/09/2019 CLINICAL DATA:  Two recent syncopal episodes. EXAM: MRI HEAD WITHOUT  CONTRAST TECHNIQUE: Multiplanar, multiecho pulse sequences of the brain and surrounding structures were obtained without intravenous contrast. COMPARISON:  Brain MRI 09/18/2018 FINDINGS: BRAIN: Punctate focus of subacute ischemia within the left frontal operculum. There are other areas of T2 shine through surrounding the site of the prior posterior left MCA territory infarct. Diffuse confluent hyperintense T2-weighted signal within the periventricular, deep and juxtacortical white matter, most commonly due to chronic ischemic microangiopathy. There is generalized atrophy without lobar predilection. There is an old small vessel infarct of the right lentiform nucleus. The midline structures are normal. There is chronic hemosiderin deposition at the site of the posterior left MCA territory hemorrhage. VASCULAR: Susceptibility-sensitive sequences show no chronic microhemorrhage or superficial siderosis. The major intracranial arterial and venous sinus flow voids are normal. SKULL AND UPPER CERVICAL SPINE: Calvarial bone marrow signal is normal. There is no skull base mass. The visualized upper cervical spine and soft tissues are normal. SINUSES/ORBITS: There are no  fluid levels or advanced mucosal thickening. The mastoid air cells and middle ear cavities are free of fluid. The orbits are normal. IMPRESSION: 1. Punctate focus of subacute ischemia within the left frontal operculum. 2. Sequelae of recent hemorrhagic infarcts of the left hemispheric watershed territories. 3. Diffuse confluent white matter hyperintense T2-weighted signal consistent with chronic ischemic microangiopathy. Electronically Signed   By: Ulyses Jarred M.D.   On: 02/09/2019 19:53    PHYSICAL EXAM Obese middle-aged Caucasian male not in distress. . Afebrile. Head is nontraumatic. Neck is supple without bruit.    Cardiac exam no murmur or gallop. Lungs are clear to auscultation. Distal pulses are well felt. Neurological Exam : Patient is awake  alert oriented to  place and person.  Speech mildly nonfluent but without significant paraphasic errors or word hesitancy.  He has good comprehension.  Extraocular movements are full range without nystagmus.  He blinks to threat bilaterally.  Face is symmetric without weakness.  Tongue is midline.  Motor system exam shows symmetric upper and lower extremity strength without focal weakness.  Sensation is intact.  Coordination is accurate.  Gait not tested.  ASSESSMENT/PLAN [name] is a 68 y.o. male with history of thrombocythemia, previous stroke, stent in coronary artery, hyperlipidemia, diabetes, CAD presenting following 2 episodes of loss of consciousness w/o new neuro symptoms.   Recurrent cryptogenic Subacute LMCA branch stroke but ? Seizure as well vs syncope  Code Stroke CT head small L insular infarct.  Old left parieto-occipital cortex infarct with petechial hemorrhage.  Advanced small vessel disease.   CTA head & neck, CT perfusion -  L M3 occlusion, new but correlates with infarct seen in July.  Atherosclerosis.  No LVO.  TEE in March ok  MRI  Punctate subacute left frontal operculum ischemia recent hemorrhagic infarcts left hemispheric watershed territories. Diffuse SVD  EEG cortical dysfunction in left frontotemporal region. No Sz.  LDL 42  HgbA1c 7.7  SCDs for VTE prophylaxis  No antithrombotic prior to admission given petechial hmg with stroke in July. Planned to consider resuming in 1 month. Ok to resume aspirin 81 mg daily. No DUAP due to [place] last month. He has f/u with Dr. Leonie Man next week.   Therapy recommendations:  No OT, OP PT, OP SLP  Disposition:  Return home  Bradycardia/Syncope  Likely pause - loop interrogated - pauses and brady monitoring turned off d/t undersensing  Cardiology consulted  Plan 30d monitor at d/c  Hx stroke/TIA ? 09/18/2018 -Scattered acute Left MCA and Left MCA/PCA watershed infarcts - embolic - unknown etiology.CTA head  and neck unremarkable.Placed on DAPT.Loop recorder placed. LDL 27 and A1C 7.8 ? 10/01/2018 - admitted for HA, CT showed small HT at infarct site of left posterior MCA. DAPT on hold.  ? 10/13/2018 - followed up at [facility] and ASA 81 restarted. ? 7/142020 - acuteL temporoparietalinfarct with petechial hmg and new L lateral parietalHT intoold left MCAinfarct. Infarct likelyembolic secondary to unknownsourcevs. Intracranial stenosis. Etiology ofrecurrent HT in the same area as before isunclear, ? HTN vs. ? Low LDL  Hypertension  Decreased benazepril 40->20 during last hospital stay  Stable 140-160s . Long-term BP goal normotensive  Hyperlipidemia  Home meds:  Held statin Lipitor 40 during last hospital stay with recommendations to continue to hold given risk of intracerebral hemorrhage with low LDL. Per med rec, patient currently taking  LDL 42, goal < 70  Decrease lipitor to 20  Diabetes type II Uncontrolled  HgbA1c 7.7, goal <  7.0  Other Stroke Risk Factors  Advanced age  Former Cigarette smoker  Family hx stroke (mother, father)  Coronary artery disease s/p DES  Hx Migraines/HA following last stroke  Obstructive sleep apnea, compliant w/ CPAP at home  Other Active Problems  abrasion of bilateral knees following syncopal episode  Hospital day # 2   Personally examined patient and images, and have participated in and made any corrections needed to history, physical, neuro exam,assessment and plan as stated above.  I have personally obtained the history, evaluated lab date, reviewed imaging studies and agree with radiology interpretations.    Sarina Ill, MD Stroke Neurology   A total of 25 minutes was spent for the care of this patient, spent on counseling patient and family on different diagnostic and therapeutic options, counseling and coordination of care, riskd ans benefits of management, compliance, or risk factor reduction and education.   To contact  Stroke Continuity provider, please refer to [email or web address] After hours, contact General Neurology

## 2019-02-10 NOTE — Progress Notes (Signed)
Physical Therapy Treatment Note  Pt seen for mobility progression. Pt is overall mod I/Supervision for mobility and with DGI score of 20/24 (unchanged since previous admission). Continue to progress as tolerated.   02/10/19 1034  PT Visit Information  Last PT Received On 02/10/19  Assistance Needed +1  History of Present Illness 68 y.o. male with medical history significant of recent hemorrhagic stroke, hyperlipidemia, diabetes type 2, gastroesophageal reflux disease, and asthma who came to the emergency department 02/08/2019 following a syncopal episode, fall, with associated new onset of left arm numbness and a speech change.  Subjective Data  Patient Stated Goal to go home and feel better  Precautions  Precautions Fall  Precaution Comments watch HR  Restrictions  Weight Bearing Restrictions No  Pain Assessment  Pain Assessment No/denies pain  Cognition  Arousal/Alertness Awake/alert  Behavior During Therapy WFL for tasks assessed/performed  Overall Cognitive Status Within Functional Limits for tasks assessed  General Comments expressive aphasia (since previous stroke)  Bed Mobility  Overal bed mobility Independent  Transfers  Overall transfer level Modified independent  Equipment used None  Ambulation/Gait  Ambulation/Gait assistance Supervision  Gait Distance (Feet) 240 Feet  Assistive device None  Gait Pattern/deviations Step-through pattern;Decreased stride length  General Gait Details decreased cadence; no significant gait deviations or LOB noted  Modified [name] (Stroke Patients Only)  Pre-Morbid [name] Score 1  Modified [name] 3  Balance  Overall balance assessment Needs assistance;History of Falls  Sitting-balance support Feet supported  Sitting balance-Leahy Scale Good  Standing balance support During functional activity;No upper extremity supported  Standing balance-Leahy Scale Fair  Dynamic Gait Index  Level Surface 3  Change in Gait Speed 2  Gait with  Horizontal Head Turns 3  Gait with Vertical Head Turns 2  Gait and Pivot Turn 3  Step Over Obstacle 3  Step Around Obstacles 2  Steps 2  Total Score 20  PT - End of Session  Equipment Utilized During Treatment Gait belt  Activity Tolerance Patient tolerated treatment well  Patient left in chair;with call bell/phone within reach  Nurse Communication Mobility status   PT - Assessment/Plan  PT Plan Current plan remains appropriate  PT Visit Diagnosis Unsteadiness on feet (R26.81)  PT Frequency (ACUTE ONLY) Min 3X/week  Follow Up Recommendations Outpatient PT;Supervision - Intermittent  PT equipment None recommended by PT  AM-PAC PT "6 Clicks" Mobility Outcome Measure (Version 2)  Help needed turning from your back to your side while in a flat bed without using bedrails? 4  Help needed moving from lying on your back to sitting on the side of a flat bed without using bedrails? 4  Help needed moving to and from a bed to a chair (including a wheelchair)? 4  Help needed standing up from a chair using your arms (e.g., wheelchair or bedside chair)? 4  Help needed to walk in hospital room? 4  Help needed climbing 3-5 steps with a railing?  3  6 Click Score 23  Consider Recommendation of Discharge To: Home with no services  PT Goal Progression  Progress towards PT goals Progressing toward goals  PT Time Calculation  PT Start Time (ACUTE ONLY) 1000  PT Stop Time (ACUTE ONLY) 1029  PT Time Calculation (min) (ACUTE ONLY) 29 min  PT General Charges  $$ ACUTE PT VISIT 1 Visit  PT Treatments  $Gait Training 23-37 mins   Earney Navy, PTA Acute Rehabilitation Services Pager: [phone] Office: [phone]

## 2019-02-10 NOTE — TOC Transition Note (Signed)
Transition of Care [facility]) - CM/SW Discharge Note   Patient Details  Name: Andrew Cobb MRN: [id number] Date of Birth: 10-11-1950  [facility] CM/SW Contact:  Pollie Friar, RN Phone Number: 02/10/2019, 6:56 PM   Clinical Narrative:    Pt discharging home with outpatient therapy. Pt was already active with [facility] for outpatient therapy. CM consulted for  resumption orders for outpatient. Entered in Epic and information on the AVS. Pt has transportation home.   Final next level of care: OP Rehab Barriers to Discharge: No Barriers Identified   Patient Goals and CMS Choice        Discharge Placement                       Discharge Plan and Services                                     Social Determinants of Health (SDOH) Interventions     Readmission Risk Interventions No flowsheet data found.

## 2019-02-10 NOTE — Progress Notes (Signed)
Patient being discharged home.  Patient to be transported by his wife.  IV removed with the catheter intact.  Discharge instructions and prescription information given to the patient who verbalized understanding. 

## 2019-02-10 NOTE — Plan of Care (Signed)
Progressing towards goals

## 2019-02-10 NOTE — Discharge Summary (Signed)
Physician Discharge Summary  Andrew Cobb:[id number] DOB: 04-Jan-1951 DOA: 02/08/2019  PCP: Guadalupe Maple, MD  Admit date: 02/08/2019 Discharge date: 02/10/2019  Admitted From: home Disposition:  home   Recommendations for Outpatient Follow-up:  1. F/u with cardiology for event monitor  Discharge Condition:  stable   CODE STATUS:  Full code   Consultations:  Neuro  Cardiology     Discharge Diagnoses:  Principal Problem:   Acute ischemic left MCA stroke (HCC) Active Problems:   Syncope   Bradycardia with 31-40 beats per minute   Hyperlipidemia   Coronary atherosclerosis   OSA (obstructive sleep apnea)   Essential hypertension   Uncontrolled type 2 diabetes mellitus with hyperglycemia, without long-term current use of insulin (HCC)   Coronary artery disease involving native coronary artery of native heart without angina pectoris   H/O right coronary artery stent placement   Abrasion of knee, bilateral      Brief Summary: Andrew Cobb is a 68 y.o.malewith medical history significant ofrecent hemorrhagic stroke, hyperlipidemia, diabetes type 2, gastroesophageal reflux disease, and asthma who came to the emergency department today following two syncopal episodes while walking with associated new onset of left arm numbness and a speech change.  In July the patient had a intracerebral hemorrhage was discharged from the hospital on July 15 with only mild speech deficits. CTA of head and neck were obtained and showed an acute M3 segment narrowing that was not present 3 weeks ago. It is very distal and no intervention is indicated. Patient was seen by Dr. Kathrynn Speed of neurology who recommended admission for acute extension of prior stroke.  He was also noted to be bradycardic with HR in 30-40 range.    Hospital Course:  Principal Problem: Syncope x 2- left arm numbness, dizziness, bradycardia - numbness and dizziness resolved - HR remains low 40s and  50s- not on any rate controlling agents - in regards to syncope, the cardiology team has consulted EP and upon review of the the loop recorder, no arrhythmias have been found- per EP note, "bradyacardia and pauses monitoring have been turned off on his linq monitor" - he is recommended to have a 30 day event monitor which they will arrange - Neuro has ordered an EEG to r/o seizure which show > cortical dysfunction in left frontotemporal region- No seizures or clear epileptiform discharges  Active Problems:   New Left MCA branch infarct (subacute) with h/o CVA in 09/18/18, 10/01/18 and 01/18/19 - CT head> Small acute infarct in the left insula.  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  - CTA head/ neck> L M3 occlusion is new when compared to July - MRI > Punctate focus of subacute ischemia within the left frontal operculum. Sequelae of recent hemorrhagic infarcts of the left hemispheric watershed territories. - LDL 42 - A1c 7.7 - Neuro recommendations > ASA daily for now- due to recent hemorrhagic infarct, Plavix has not been added- he is also on Lipitor which he can continue - neurological changes from 8/4 have resolved and he is at baseline - PT recommends outpt PT - no OT needed     Uncontrolled type 2 diabetes mellitus with hyperglycemia, without long-term current use of insulin - A1c 7.7 - Metformin can be continued       Coronary artery disease involving native coronary artery of native heart without angina pectoris - has right coronary artery stent      Abrasion of knee, bilateral  - due  to fall during syncope- cont dressings    Discharge Exam: Vitals:   02/10/19 0814 02/10/19 1230  BP: 123/87 (!) 133/104  Pulse: 60 88  Resp: 16 16  Temp: 97.8 F (36.6 C) 98.2 F (36.8 C)  SpO2: 100%    Vitals:   02/09/19 2312 02/10/19 0408 02/10/19 0814 02/10/19 1230  BP: 138/77 114/77 123/87 (!) 133/104  Pulse: (!) 51 62 60 88  Resp: 15  16 16   Temp: 97.7  F (36.5 C) 98.7 F (37.1 C) 97.8 F (36.6 C) 98.2 F (36.8 C)  TempSrc: Oral Oral Oral Oral  SpO2: 99% 98% 100%   Weight:      Height:        General: Pt is alert, awake, not in acute distress Cardiovascular: RRR, S1/S2 +, no rubs, no gallops Respiratory: CTA bilaterally, no wheezing, no rhonchi Abdominal: Soft, NT, ND, bowel sounds + Extremities: no edema, no cyanosis   Discharge Instructions  Discharge Instructions    Diet - low sodium heart healthy   Complete by: As directed    Diet Carb Modified   Complete by: As directed    Increase activity slowly   Complete by: As directed      Allergies as of 02/10/2019      Reactions   Succinylsulphathiazole Rash   Sulfamethoxazole-trimethoprim Rash   Tetracyclines & Related Rash      Medication List    TAKE these medications   amitriptyline 25 MG tablet Commonly known as: ELAVIL Take 1 tablet (25 mg total) by mouth at bedtime.   aspirin 81 MG EC tablet Take 1 tablet (81 mg total) by mouth daily. Start taking on: February 11, 2019   atorvastatin 40 MG tablet Commonly known as: LIPITOR Take 40 mg by mouth at bedtime.   benazepril 40 MG tablet Commonly known as: LOTENSIN Take 0.5 tablets (20 mg total) by mouth daily. What changed: how much to take   cetirizine 10 MG tablet Commonly known as: ZYRTEC Take 10 mg by mouth daily.   glucose blood test strip 1 each by Other route 2 (two) times daily. DX E11.9   Magnesium 250 MG Tabs Take 250 mg by mouth at bedtime.   metFORMIN 500 MG tablet Commonly known as: GLUCOPHAGE Take 1 tablet (500 mg total) by mouth 2 (two) times daily with a meal.   MULTIVITAMIN PO Take 1 tablet by mouth daily.   onetouch ultrasoft lancets 1 each by Other route 2 (two) times daily. Dx E11.9   valACYclovir 500 MG tablet Commonly known as: VALTREX Take 1 tablet (500 mg total) by mouth daily. What changed: when to take this   Vitamin D 50 MCG (2000 UT) Caps Take 2,000 Units by mouth  daily.       Allergies  Allergen Reactions  . Succinylsulphathiazole Rash  . Sulfamethoxazole-Trimethoprim Rash  . Tetracyclines & Related Rash     Procedures/Studies:    Ct Angio Head W Or Wo Contrast  Result Date: 02/08/2019 CLINICAL DATA:  Weakness. EXAM: CT ANGIOGRAPHY HEAD AND NECK CT PERFUSION BRAIN TECHNIQUE: Multidetector CT imaging of the head and neck was performed using the standard protocol during bolus administration of intravenous contrast. Multiplanar CT image reconstructions and MIPs were obtained to evaluate the vascular anatomy. Carotid stenosis measurements (when applicable) are obtained utilizing NASCET criteria, using the distal internal carotid diameter as the denominator. Multiphase CT imaging of the brain was performed following IV bolus contrast injection. Subsequent parametric perfusion maps were calculated using RAPID  software. CONTRAST:  Reference EMR COMPARISON:  01/18/2019 CTA head neck. FINDINGS: CTA NECK FINDINGS Aortic arch: 2 vessel branching. Right carotid system: Atherosclerotic plaque along the common carotid and proximal ICA without flow limiting stenosis or ulceration in the proximal ICA. There is ECA origin stenosis. Left carotid system: Moderate mixed density plaque at the bifurcation without flow limiting stenosis or ulceration. Vertebral arteries: Proximal subclavian atherosclerosis without ulceration or flow limiting stenosis. Negative for stenosis, beading, or ulceration. 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 No acute or aggressive finding Other neck: No significant incidental finding Upper chest: Negative Review of the MIP images confirms the above findings CTA HEAD FINDINGS Anterior circulation: Left M3 branch occlusion, new from prior but correlating with the known left cerebral infarcts. No large vessel occlusion. There is mild atherosclerotic plaque on the carotid siphons. Negative for aneurysm. Posterior circulation: The  vertebral and basilar arteries are smooth and widely patent. No branch occlusion or flow limiting stenosis. Venous sinuses: Patent Anatomic variants: Negative Review of the MIP images confirms the above findings CT Brain Perfusion Findings: CBF (<30%) Volume: 45mL Perfusion (Tmax>6.0s) volume: 51mL Mismatch Volume: 6mL These results were called by telephone at the time of interpretation on 02/08/2019 at 10:47 am to Dr. Roland Rack , who verbally acknowledged these results. IMPRESSION: 1. Left M3 occlusion that is new from 01/18/2019 CTA but correlates with the now chronic and subacute infarct seen on the July 2020 imaging. Cerebral perfusion suggests peri-infarct penumbra measuring 19 cc. 2. Atherosclerosis without flow limiting stenosis or evident embolic source. Electronically Signed   By: Monte Fantasia M.D.   On: 02/08/2019 10:50   Ct Angio Head W Or Wo Contrast  Result Date: 01/18/2019 CLINICAL DATA:  Stroke workup EXAM: CT ANGIOGRAPHY HEAD AND NECK TECHNIQUE: Multidetector CT imaging of the head and neck was performed using the standard protocol during bolus administration of intravenous contrast. Multiplanar CT image reconstructions and MIPs were obtained to evaluate the vascular anatomy. Carotid stenosis measurements (when applicable) are obtained utilizing NASCET criteria, using the distal internal carotid diameter as the denominator. CONTRAST:  135mL OMNIPAQUE IOHEXOL 350 MG/ML SOLN COMPARISON:  Brain MRI from yesterday FINDINGS: CTA NECK FINDINGS Aortic arch: Normal with 2 vessel branching. Right carotid system: Moderate mixed density plaque at the bifurcation and ICA bulb without flow limiting stenosis or ulceration. Negative for beading. Left carotid system: Moderate mixed density plaque about the left carotid bifurcation. No ulceration or stenosis. No downstream beading Vertebral arteries: Proximal subclavian atherosclerosis without flow limiting stenosis. Plaque at the left V1 segment  without flow limiting stenosis. Negative for vertebral beading or dissection. Skeleton: C3-C7 ACDF with solid arthrodesis. There is also ankylosis at C2-3. Other neck: No acute or aggressive finding Upper chest: Negative Review of the MIP images confirms the above findings CTA HEAD FINDINGS Anterior circulation: Mild plaque on the carotid siphons. No major branch occlusion or flow limiting stenosis. Hypoplastic right A1 segment. Negative for aneurysm. Posterior circulation: Vertebral and basilar arteries are smooth and diffusely patent. Hypoplastic left P1 segment. No branch occlusion or flow limiting stenosis. Venous sinuses: Diffusely patent Anatomic variants: As above Delayed phase: Not obtained Acute and chronic ischemic changes with petechial hemorrhage that is not changed from prior brain MRI allowing for cross modality differences. Review of the MIP images confirms the above findings IMPRESSION: 1. No emergent finding. 2. Atherosclerosis without flow limiting stenosis, clear embolic source, or evident vasculopathy. Electronically Signed   By: Monte Fantasia M.D.   On:  01/18/2019 11:56   Ct Angio Neck W Or Wo Contrast  Result Date: 02/08/2019 CLINICAL DATA:  Weakness. EXAM: CT ANGIOGRAPHY HEAD AND NECK CT PERFUSION BRAIN TECHNIQUE: Multidetector CT imaging of the head and neck was performed using the standard protocol during bolus administration of intravenous contrast. Multiplanar CT image reconstructions and MIPs were obtained to evaluate the vascular anatomy. Carotid stenosis measurements (when applicable) are obtained utilizing NASCET criteria, using the distal internal carotid diameter as the denominator. Multiphase CT imaging of the brain was performed following IV bolus contrast injection. Subsequent parametric perfusion maps were calculated using RAPID software. CONTRAST:  Reference EMR COMPARISON:  01/18/2019 CTA head neck. FINDINGS: CTA NECK FINDINGS Aortic arch: 2 vessel branching. Right carotid  system: Atherosclerotic plaque along the common carotid and proximal ICA without flow limiting stenosis or ulceration in the proximal ICA. There is ECA origin stenosis. Left carotid system: Moderate mixed density plaque at the bifurcation without flow limiting stenosis or ulceration. Vertebral arteries: Proximal subclavian atherosclerosis without ulceration or flow limiting stenosis. Negative for stenosis, beading, or ulceration. 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 No acute or aggressive finding Other neck: No significant incidental finding Upper chest: Negative Review of the MIP images confirms the above findings CTA HEAD FINDINGS Anterior circulation: Left M3 branch occlusion, new from prior but correlating with the known left cerebral infarcts. No large vessel occlusion. There is mild atherosclerotic plaque on the carotid siphons. Negative for aneurysm. Posterior circulation: The vertebral and basilar arteries are smooth and widely patent. No branch occlusion or flow limiting stenosis. Venous sinuses: Patent Anatomic variants: Negative Review of the MIP images confirms the above findings CT Brain Perfusion Findings: CBF (<30%) Volume: 1mL Perfusion (Tmax>6.0s) volume: 63mL Mismatch Volume: 76mL These results were called by telephone at the time of interpretation on 02/08/2019 at 10:47 am to Dr. Roland Rack , who verbally acknowledged these results. IMPRESSION: 1. Left M3 occlusion that is new from 01/18/2019 CTA but correlates with the now chronic and subacute infarct seen on the July 2020 imaging. Cerebral perfusion suggests peri-infarct penumbra measuring 19 cc. 2. Atherosclerosis without flow limiting stenosis or evident embolic source. Electronically Signed   By: Monte Fantasia M.D.   On: 02/08/2019 10:50   Ct Angio Neck W Or Wo Contrast  Result Date: 01/18/2019 CLINICAL DATA:  Stroke workup EXAM: CT ANGIOGRAPHY HEAD AND NECK TECHNIQUE: Multidetector CT imaging of the  head and neck was performed using the standard protocol during bolus administration of intravenous contrast. Multiplanar CT image reconstructions and MIPs were obtained to evaluate the vascular anatomy. Carotid stenosis measurements (when applicable) are obtained utilizing NASCET criteria, using the distal internal carotid diameter as the denominator. CONTRAST:  175mL OMNIPAQUE IOHEXOL 350 MG/ML SOLN COMPARISON:  Brain MRI from yesterday FINDINGS: CTA NECK FINDINGS Aortic arch: Normal with 2 vessel branching. Right carotid system: Moderate mixed density plaque at the bifurcation and ICA bulb without flow limiting stenosis or ulceration. Negative for beading. Left carotid system: Moderate mixed density plaque about the left carotid bifurcation. No ulceration or stenosis. No downstream beading Vertebral arteries: Proximal subclavian atherosclerosis without flow limiting stenosis. Plaque at the left V1 segment without flow limiting stenosis. Negative for vertebral beading or dissection. Skeleton: C3-C7 ACDF with solid arthrodesis. There is also ankylosis at C2-3. Other neck: No acute or aggressive finding Upper chest: Negative Review of the MIP images confirms the above findings CTA HEAD FINDINGS Anterior circulation: Mild plaque on the carotid siphons. No major branch occlusion or flow  limiting stenosis. Hypoplastic right A1 segment. Negative for aneurysm. Posterior circulation: Vertebral and basilar arteries are smooth and diffusely patent. Hypoplastic left P1 segment. No branch occlusion or flow limiting stenosis. Venous sinuses: Diffusely patent Anatomic variants: As above Delayed phase: Not obtained Acute and chronic ischemic changes with petechial hemorrhage that is not changed from prior brain MRI allowing for cross modality differences. Review of the MIP images confirms the above findings IMPRESSION: 1. No emergent finding. 2. Atherosclerosis without flow limiting stenosis, clear embolic source, or evident  vasculopathy. Electronically Signed   By: Monte Fantasia M.D.   On: 01/18/2019 11:56   Mr Brain Wo Contrast  Result Date: 02/09/2019 CLINICAL DATA:  Two recent syncopal episodes. EXAM: MRI HEAD WITHOUT CONTRAST TECHNIQUE: Multiplanar, multiecho pulse sequences of the brain and surrounding structures were obtained without intravenous contrast. COMPARISON:  Brain MRI 09/18/2018 FINDINGS: BRAIN: Punctate focus of subacute ischemia within the left frontal operculum. There are other areas of T2 shine through surrounding the site of the prior posterior left MCA territory infarct. Diffuse confluent hyperintense T2-weighted signal within the periventricular, deep and juxtacortical white matter, most commonly due to chronic ischemic microangiopathy. There is generalized atrophy without lobar predilection. There is an old small vessel infarct of the right lentiform nucleus. The midline structures are normal. There is chronic hemosiderin deposition at the site of the posterior left MCA territory hemorrhage. VASCULAR: Susceptibility-sensitive sequences show no chronic microhemorrhage or superficial siderosis. The major intracranial arterial and venous sinus flow voids are normal. SKULL AND UPPER CERVICAL SPINE: Calvarial bone marrow signal is normal. There is no skull base mass. The visualized upper cervical spine and soft tissues are normal. SINUSES/ORBITS: There are no fluid levels or advanced mucosal thickening. The mastoid air cells and middle ear cavities are free of fluid. The orbits are normal. IMPRESSION: 1. Punctate focus of subacute ischemia within the left frontal operculum. 2. Sequelae of recent hemorrhagic infarcts of the left hemispheric watershed territories. 3. Diffuse confluent white matter hyperintense T2-weighted signal consistent with chronic ischemic microangiopathy. Electronically Signed   By: Ulyses Jarred M.D.   On: 02/09/2019 19:53   Mr Jeri Cos GG Contrast  Result Date: 01/18/2019 CLINICAL DATA:   68 y/o  M; intracranial hemorrhage for follow-up. EXAM: MRI HEAD WITHOUT AND WITH CONTRAST TECHNIQUE: Multiplanar, multiecho pulse sequences of the brain and surrounding structures were obtained without and with intravenous contrast. CONTRAST:  9.3 cc Gadavist COMPARISON:  01/17/2019 CT head.  09/18/2018 MRI head. FINDINGS: Brain: Focus of reduced diffusion measuring 3.0 x 3.5 x 2.0 cm (volume = 11 cm^3) centered within the left temporoparietal junction compatible with acute/early subacute infarction. There is confluent petechial hemorrhage within the new area of acute/early subacute infarction (series 12, image 34). The acute/early subacute infarction is at the anterior margin of a now chronic left lateral parietal hemorrhagic infarction identified on the 3/14 MRI of the head. Stable chronic hemorrhagic infarcts within the left superior parietal lobe and left anterolateral frontal lobe. Increased susceptibility hypointensity within the left lateral parietal lobe chronic infarction consistent with findings of recent hemorrhage on CT. There is laminar necrosis with T1 shortening within the left parietal lobe chronic infarctions and mild non masslike enhancement which can be seen with stroke or hemorrhage. Large confluent nonspecific T2 FLAIR hyperintensities in subcortical and periventricular white matter are compatible with severe chronic microvascular ischemic changes. Mild volume loss of the brain. No hydrocephalus or herniation. Very small chronic infarct in the right lentiform nucleus. Vascular: Normal flow voids.  Skull and upper cervical spine: Normal marrow signal. Sinuses/Orbits: Negative. Other: None. IMPRESSION: 1. Acute/early subacute infarction, measuring up to 3.5 cm 11 cc, centered within the left temporoparietal junction. Associated confluent petechial hemorrhage without mass effect, [place] calssification 1b: HI2, confluent petechiae, no mass effect. 2. New hemorrhage within the the adjacent left  lateral parietal lobe chronic infarction. 3. Stable small chronic hemorrhagic infarcts in the left superior parietal lobe and the left frontal lobe. 4. No additional findings of intracranial hemorrhage. These results were called by telephone at the time of interpretation on 01/18/2019 at 12:51 am to Dr. Cheral Marker, who verbally acknowledged these results. Electronically Signed   By: Kristine Garbe M.D.   On: 01/18/2019 00:54   Ct Cerebral Perfusion W Contrast  Result Date: 02/08/2019 CLINICAL DATA:  Weakness. EXAM: CT ANGIOGRAPHY HEAD AND NECK CT PERFUSION BRAIN TECHNIQUE: Multidetector CT imaging of the head and neck was performed using the standard protocol during bolus administration of intravenous contrast. Multiplanar CT image reconstructions and MIPs were obtained to evaluate the vascular anatomy. Carotid stenosis measurements (when applicable) are obtained utilizing NASCET criteria, using the distal internal carotid diameter as the denominator. Multiphase CT imaging of the brain was performed following IV bolus contrast injection. Subsequent parametric perfusion maps were calculated using RAPID software. CONTRAST:  Reference EMR COMPARISON:  01/18/2019 CTA head neck. FINDINGS: CTA NECK FINDINGS Aortic arch: 2 vessel branching. Right carotid system: Atherosclerotic plaque along the common carotid and proximal ICA without flow limiting stenosis or ulceration in the proximal ICA. There is ECA origin stenosis. Left carotid system: Moderate mixed density plaque at the bifurcation without flow limiting stenosis or ulceration. Vertebral arteries: Proximal subclavian atherosclerosis without ulceration or flow limiting stenosis. Negative for stenosis, beading, or ulceration. 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 No acute or aggressive finding Other neck: No significant incidental finding Upper chest: Negative Review of the MIP images confirms the above findings CTA HEAD FINDINGS  Anterior circulation: Left M3 branch occlusion, new from prior but correlating with the known left cerebral infarcts. No large vessel occlusion. There is mild atherosclerotic plaque on the carotid siphons. Negative for aneurysm. Posterior circulation: The vertebral and basilar arteries are smooth and widely patent. No branch occlusion or flow limiting stenosis. Venous sinuses: Patent Anatomic variants: Negative Review of the MIP images confirms the above findings CT Brain Perfusion Findings: CBF (<30%) Volume: 20mL Perfusion (Tmax>6.0s) volume: 50mL Mismatch Volume: 45mL These results were called by telephone at the time of interpretation on 02/08/2019 at 10:47 am to Dr. Roland Rack , who verbally acknowledged these results. IMPRESSION: 1. Left M3 occlusion that is new from 01/18/2019 CTA but correlates with the now chronic and subacute infarct seen on the July 2020 imaging. Cerebral perfusion suggests peri-infarct penumbra measuring 19 cc. 2. Atherosclerosis without flow limiting stenosis or evident embolic source. Electronically Signed   By: Monte Fantasia M.D.   On: 02/08/2019 10:50   Dg Chest Portable 1 View  Result Date: 02/08/2019 CLINICAL DATA:  68 year old male status post syncope at 0830 hours. EXAM: PORTABLE CHEST 1 VIEW COMPARISON:  01/17/2019 and earlier. FINDINGS: Portable AP semi upright view at 1026 hours. Stable left chest cardiac event recorder. Lung volumes and mediastinal contours are stable and within normal limits. Visualized tracheal air column is within normal limits. Mild scarring at the left lung base suspected, otherwise Allowing for portable technique the lungs are clear. Postoperative changes to both shoulders in the cervical spine. IMPRESSION: No acute cardiopulmonary abnormality. Electronically Signed  By: Genevie Ann M.D.   On: 02/08/2019 10:51   Chest Port 1 View  Result Date: 01/17/2019 CLINICAL DATA:  Code stroke.  Cough for 1 day. EXAM: PORTABLE CHEST 1 VIEW COMPARISON:   07/05/2015 FINDINGS: The cardiomediastinal silhouette is unremarkable. Minimal LEFT basilar scarring again noted. Monitor/recording device overlying the LEFT chest noted. There is no evidence of focal airspace disease, pulmonary edema, suspicious pulmonary nodule/mass, pleural effusion, or pneumothorax. No acute bony abnormalities are identified. RIGHT humeral surgical hardware again identified. IMPRESSION: No acute abnormality. Electronically Signed   By: Margarette Canada M.D.   On: 01/17/2019 16:00   Ct Head Code Stroke Wo Contrast  Result Date: 02/08/2019 CLINICAL DATA:  Code stroke.  Flaccid left side EXAM: CT HEAD WITHOUT CONTRAST TECHNIQUE: Contiguous axial images were obtained from the base of the skull through the vertex without intravenous contrast. COMPARISON:  01/17/2019 FINDINGS: Brain: Cytotoxic edema in the left insula that is new from prior. 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 Given the interval this is presumably at least partially from mineralization. Small remote anterior and high left frontal cortex infarct. Extensive small vessel ischemic gliosis in the cerebral white matter. Vascular: Hyperdense left M3 branch on coronal reformats, chronic. Skull: Negative Sinuses/Orbits: Negative Other: These results were called by telephone at the time of interpretation on 02/08/2019 at 10:23 am to Dr. Leonel Ramsay , who verbally acknowledged these results. ASPECTS [facility] Stroke Program Early CT Score) Not scored given the infarcts of various ages. IMPRESSION: 1. Small acute infarct in the left insula. 2. 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 Given the interval there is likely also developing mineralization. 3. Advanced chronic small vessel ischemia. Electronically Signed   By: Monte Fantasia M.D.   On: 02/08/2019 10:25   Ct Head  Code Stroke Wo Contrast  Result Date: 01/17/2019 CLINICAL DATA:  Code stroke. Difficulty with speech. History of stroke. EXAM: CT HEAD WITHOUT CONTRAST TECHNIQUE: Contiguous axial images were obtained from the base of the skull through the vertex without intravenous contrast. COMPARISON:  CT head 10/01/2018.  MRI head 09/18/2018 FINDINGS: Brain: Extension of hemorrhagic infarct in the left parietal lobe compared with the prior study. The hemorrhagic infarction is in the exact same location but is more prominent in size on today's study. No other areas of acute infarct Extensive chronic ischemic changes throughout the white matter. Chronic infarct left frontal lobe as noted previously. Mild atrophy without hydrocephalus. No mass or midline shift. Vascular: Negative for hyperdense vessel Skull: Negative Sinuses/Orbits: Mild mucosal edema in the ethmoid sinuses. Bilateral cataract surgery. Other: None ASPECTS ([facility] Stroke Program Early CT Score) Aspect score not applicable. Hemorrhagic infarct on the left as above. IMPRESSION: 1. 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 This has progressed since the prior CT of 10/01/2018. Differential diagnosis includes recurrent emboli, intracranial stenosis, cerebral amyloid. 2. These results were called by telephone at the time of interpretation on 01/17/2019 at 3:26 pm to Dr. Rory Percy, who verbally acknowledged these results. Electronically Signed   By: Franchot Gallo M.D.   On: 01/17/2019 15:27     The results of significant diagnostics from this hospitalization (including imaging, microbiology, ancillary and laboratory) are listed below for reference.     Microbiology: Recent Results (from the past 240 hour(s))  SARS CORONAVIRUS 2 Nasal Swab Aptima Multi Swab     Status: None  Collection Time: 02/08/19 10:17 AM   Specimen: Aptima Multi Swab; Nasal Swab  Result Value Ref Range Status   SARS Coronavirus 2 NEGATIVE NEGATIVE Final     Comment: (NOTE) SARS-CoV-2 target nucleic acids are NOT DETECTED. The SARS-CoV-2 RNA is generally detectable in upper and lower respiratory specimens during the acute phase of infection. Negative results do not preclude SARS-CoV-2 infection, do not rule out co-infections with other pathogens, and should not be used as the sole basis for treatment or other patient management decisions. Negative results must be combined with clinical observations, patient history, and epidemiological information. The expected result is Negative. Fact Sheet for Patients: SugarRoll.be Fact Sheet for Healthcare Providers: https://www.woods-mathews.com/ This test is not yet approved or cleared by the [place] FDA and  has been authorized for detection and/or diagnosis of SARS-CoV-2 by FDA under an Emergency Use Authorization (EUA). This EUA will remain  in effect (meaning this test can be used) for the duration of the COVID-19 declaration under Section 56 4(b)(1) of the Act, 21 U.S.C. section 360bbb-3(b)(1), unless the authorization is terminated or revoked sooner. Performed at [facility], [place] [address], [place], [place] [address]      Labs: BNP (last 3 results) Recent Labs    08/09/18 1412  BNP 19.5   Basic Metabolic Panel: Recent Labs  Lab 02/08/19 1000 02/08/19 1013 02/09/19 0458  NA 137 137  --   K 4.3 4.4  --   CL 101 99  --   CO2 23  --   --   GLUCOSE 321* 310*  --   BUN 20 24*  --   CREATININE 1.27* 1.10  --   CALCIUM 9.2  --   --   MG  --   --  2.1   Liver Function Tests: Recent Labs  Lab 02/08/19 1000  AST 29  ALT 37  ALKPHOS 62  BILITOT 1.1  PROT 6.5  ALBUMIN 4.1   No results for input(s): LIPASE, AMYLASE in the last 168 hours. No results for input(s): AMMONIA in the last 168 hours. CBC: Recent Labs  Lab 02/08/19 1000 02/08/19 1013  WBC 4.1  --   NEUTROABS 2.5  --   HGB 14.0 14.6  HCT 43.8 43.0  MCV 98.2   --   PLT 144*  --    Cardiac Enzymes: No results for input(s): CKTOTAL, CKMB, CKMBINDEX, TROPONINI in the last 168 hours. BNP: Invalid input(s): POCBNP CBG: Recent Labs  Lab 02/09/19 1143 02/09/19 1638 02/09/19 2128 02/10/19 0611 02/10/19 1129  GLUCAP 184* 99 104* 137* 189*   D-Dimer No results for input(s): DDIMER in the last 72 hours. Hgb A1c Recent Labs    02/09/19 0458  HGBA1C 7.7*   Lipid Profile Recent Labs    02/09/19 0458  CHOL 104  HDL 47  LDLCALC 42  TRIG 77  CHOLHDL 2.2   Thyroid function studies Recent Labs    02/09/19 0458  TSH 2.438   Anemia work up No results for input(s): VITAMINB12, FOLATE, FERRITIN, TIBC, IRON, RETICCTPCT in the last 72 hours. Urinalysis    Component Value Date/Time   COLORURINE STRAW (A) 02/08/2019 1720   APPEARANCEUR CLEAR 02/08/2019 1720   APPEARANCEUR Clear 07/21/2017 0843   LABSPEC 1.031 (H) 02/08/2019 1720   PHURINE 8.0 02/08/2019 1720   GLUCOSEU NEGATIVE 02/08/2019 1720   HGBUR NEGATIVE 02/08/2019 1720   BILIRUBINUR NEGATIVE 02/08/2019 1720   BILIRUBINUR Negative 07/21/2017 [place] 02/08/2019 1720  PROTEINUR NEGATIVE 02/08/2019 1720   UROBILINOGEN 0.2 07/11/2012 1443   NITRITE NEGATIVE 02/08/2019 1720   LEUKOCYTESUR NEGATIVE 02/08/2019 1720   Sepsis Labs Invalid input(s): PROCALCITONIN,  WBC,  LACTICIDVEN Microbiology Recent Results (from the past 240 hour(s))  SARS CORONAVIRUS 2 Nasal Swab Aptima Multi Swab     Status: None   Collection Time: 02/08/19 10:17 AM   Specimen: Aptima Multi Swab; Nasal Swab  Result Value Ref Range Status   SARS Coronavirus 2 NEGATIVE NEGATIVE Final    Comment: (NOTE) SARS-CoV-2 target nucleic acids are NOT DETECTED. The SARS-CoV-2 RNA is generally detectable in upper and lower respiratory specimens during the acute phase of infection. Negative results do not preclude SARS-CoV-2 infection, do not rule out co-infections with other pathogens, and should not  be used as the sole basis for treatment or other patient management decisions. Negative results must be combined with clinical observations, patient history, and epidemiological information. The expected result is Negative. Fact Sheet for Patients: SugarRoll.be Fact Sheet for Healthcare Providers: https://www.woods-mathews.com/ This test is not yet approved or cleared by the [place] FDA and  has been authorized for detection and/or diagnosis of SARS-CoV-2 by FDA under an Emergency Use Authorization (EUA). This EUA will remain  in effect (meaning this test can be used) for the duration of the COVID-19 declaration under Section 56 4(b)(1) of the Act, 21 U.S.C. section 360bbb-3(b)(1), unless the authorization is terminated or revoked sooner. Performed at [facility], [place] [address], [place], [place] [address]      Time coordinating discharge in minutes: 19  SIGNED:   Debbe Odea, MD  [facility] 02/10/2019, 3:25 PM Pager   If 7PM-7AM, please contact night-coverage [email or web address] Password [email or web address]

## 2019-02-10 NOTE — Progress Notes (Signed)
Event monitor arranged.   Electrophysiology team to see as needed while here. Please call with questions.    [address], [place] Pager: [phone]  02/10/2019 8:04 AM

## 2019-02-14 ENCOUNTER — Telehealth: Payer: Self-pay

## 2019-02-14 NOTE — Telephone Encounter (Signed)
Called patient to complete to TCM after discharged from hospital. Patient declined hospital follow up with PCP, states he has follow up with cardiology and neurology follow ups. States he is doing okay currently and declines any concerns.   Patient did want to know about his metformin being increased- he discussed with Catie the pharmacisit a few weeks ago. - Will Fwd to Catie  Informed patient to call if he needs anything or requests hospital follow up with PCP.

## 2019-02-15 ENCOUNTER — Telehealth: Payer: Self-pay | Admitting: *Deleted

## 2019-02-15 ENCOUNTER — Encounter: Payer: PPO | Admitting: Speech Pathology

## 2019-02-15 ENCOUNTER — Encounter: Payer: PPO | Admitting: Occupational Therapy

## 2019-02-15 NOTE — Progress Notes (Signed)
Carelink Summary Report / Loop Recorder 

## 2019-02-15 NOTE — Telephone Encounter (Signed)
[employer] to ship 30 day cardiac event monitor to patients home.  Instructions reviewed briefly as they are included in the monitor kit.

## 2019-02-17 ENCOUNTER — Encounter: Payer: Self-pay | Admitting: Neurology

## 2019-02-17 ENCOUNTER — Encounter: Payer: PPO | Admitting: Speech Pathology

## 2019-02-17 ENCOUNTER — Ambulatory Visit (INDEPENDENT_AMBULATORY_CARE_PROVIDER_SITE_OTHER): Payer: PPO | Admitting: Neurology

## 2019-02-17 ENCOUNTER — Ambulatory Visit: Payer: PPO | Admitting: Physical Therapy

## 2019-02-17 ENCOUNTER — Other Ambulatory Visit: Payer: Self-pay

## 2019-02-17 VITALS — BP 119/67 | HR 89 | Temp 96.6°F | Wt 207.6 lb

## 2019-02-17 DIAGNOSIS — R55 Syncope and collapse: Secondary | ICD-10-CM | POA: Diagnosis not present

## 2019-02-17 DIAGNOSIS — R4701 Aphasia: Secondary | ICD-10-CM

## 2019-02-17 DIAGNOSIS — I639 Cerebral infarction, unspecified: Secondary | ICD-10-CM

## 2019-02-17 NOTE — Patient Instructions (Signed)
I had a long d/w patient about his recent syncopal episode and stroke, risk for recurrent stroke/TIAs, personally independently reviewed imaging studies and stroke evaluation results and answered questions.Continue aspirin 81 mg daily  for secondary stroke prevention and maintain strict control of hypertension with blood pressure goal below 130/90, diabetes with hemoglobin A1c goal below 6.5% and lipids with LDL cholesterol goal below 70 mg/dL. I also advised the patient to eat a healthy diet with plenty of whole grains, cereals, fruits and vegetables, exercise regularly and maintain ideal body weight.  Check EEG for seizure activity and keep appointment for 30-day external cardiac monitoring as per Dr. Curt Bears.  Refer to outpatient speech therapy for aphasia.  I advised the patient not to drive as per [facility] law due to his recent syncopal episode.  Followup in the future with my nurse practitioner [name] in 3 months or call earlier if necessary.  Stroke Prevention Some medical conditions and behaviors are associated with a higher chance of having a stroke. You can help prevent a stroke by making nutrition, lifestyle, and other changes, including managing any medical conditions you may have. What nutrition changes can be made?   Eat healthy foods. You can do this by: ? Choosing foods high in fiber, such as fresh fruits and vegetables and whole grains. ? Eating at least 5 or more servings of fruits and vegetables a day. Try to fill half of your plate at each meal with fruits and vegetables. ? Choosing lean protein foods, such as lean cuts of meat, poultry without skin, fish, tofu, beans, and nuts. ? Eating low-fat dairy products. ? Avoiding foods that are high in salt (sodium). This can help lower blood pressure. ? Avoiding foods that have saturated fat, trans fat, and cholesterol. This can help prevent high cholesterol. ? Avoiding processed and premade foods.  Follow your health care  provider's specific guidelines for losing weight, controlling high blood pressure (hypertension), lowering high cholesterol, and managing diabetes. These may include: ? Reducing your daily calorie intake. ? Limiting your daily sodium intake to 1,500 milligrams (mg). ? Using only healthy fats for cooking, such as olive oil, canola oil, or sunflower oil. ? Counting your daily carbohydrate intake. What lifestyle changes can be made?  Maintain a healthy weight. Talk to your health care provider about your ideal weight.  Get at least 30 minutes of moderate physical activity at least 5 days a week. Moderate activity includes brisk walking, biking, and swimming.  Do not use any products that contain nicotine or tobacco, such as cigarettes and e-cigarettes. If you need help quitting, ask your health care provider. It may also be helpful to avoid exposure to secondhand smoke.  Limit alcohol intake to no more than 1 drink a day for nonpregnant women and 2 drinks a day for men. One drink equals 12 oz of beer, 5 oz of wine, or 1 oz of hard liquor.  Stop any illegal drug use.  Avoid taking birth control pills. Talk to your health care provider about the risks of taking birth control pills if: ? You are over 16 years old. ? You smoke. ? You get migraines. ? You have ever had a blood clot. What other changes can be made?  Manage your cholesterol levels. ? Eating a healthy diet is important for preventing high cholesterol. If cholesterol cannot be managed through diet alone, you may also need to take medicines. ? Take any prescribed medicines to control your cholesterol as told by your health  care provider.  Manage your diabetes. ? Eating a healthy diet and exercising regularly are important parts of managing your blood sugar. If your blood sugar cannot be managed through diet and exercise, you may need to take medicines. ? Take any prescribed medicines to control your diabetes as told by your health  care provider.  Control your hypertension. ? To reduce your risk of stroke, try to keep your blood pressure below 130/80. ? Eating a healthy diet and exercising regularly are an important part of controlling your blood pressure. If your blood pressure cannot be managed through diet and exercise, you may need to take medicines. ? Take any prescribed medicines to control hypertension as told by your health care provider. ? Ask your health care provider if you should monitor your blood pressure at home. ? Have your blood pressure checked every year, even if your blood pressure is normal. Blood pressure increases with age and some medical conditions.  Get evaluated for sleep disorders (sleep apnea). Talk to your health care provider about getting a sleep evaluation if you snore a lot or have excessive sleepiness.  Take over-the-counter and prescription medicines only as told by your health care provider. Aspirin or blood thinners (antiplatelets or anticoagulants) may be recommended to reduce your risk of forming blood clots that can lead to stroke.  Make sure that any other medical conditions you have, such as atrial fibrillation or atherosclerosis, are managed. What are the warning signs of a stroke? The warning signs of a stroke can be easily remembered as BEFAST.  B is for balance. Signs include: ? Dizziness. ? Loss of balance or coordination. ? Sudden trouble walking.  E is for eyes. Signs include: ? A sudden change in vision. ? Trouble seeing.  F is for face. Signs include: ? Sudden weakness or numbness of the face. ? The face or eyelid drooping to one side.  A is for arms. Signs include: ? Sudden weakness or numbness of the arm, usually on one side of the body.  S is for speech. Signs include: ? Trouble speaking (aphasia). ? Trouble understanding.  T is for time. ? These symptoms may represent a serious problem that is an emergency. Do not wait to see if the symptoms will go  away. Get medical help right away. Call your local emergency services (911 in the [place] Do not drive yourself to the hospital.  Other signs of stroke may include: ? A sudden, severe headache with no known cause. ? Nausea or vomiting. ? Seizure. Where to find more information For more information, visit:  American Stroke Association: www.strokeassociation.org  National Stroke Association: www.stroke.org Summary  You can prevent a stroke by eating healthy, exercising, not smoking, limiting alcohol intake, and managing any medical conditions you may have.  Do not use any products that contain nicotine or tobacco, such as cigarettes and e-cigarettes. If you need help quitting, ask your health care provider. It may also be helpful to avoid exposure to secondhand smoke.  Remember BEFAST for warning signs of stroke. Get help right away if you or a loved one has any of these signs. This information is not intended to replace advice given to you by your health care provider. Make sure you discuss any questions you have with your health care provider. Document Released: 07/31/2004 Document Revised: 06/05/2017 Document Reviewed: 07/29/2016 Elsevier Patient Education  2020 [employer].
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
## 2019-02-19 ENCOUNTER — Ambulatory Visit (INDEPENDENT_AMBULATORY_CARE_PROVIDER_SITE_OTHER): Payer: PPO

## 2019-02-19 DIAGNOSIS — R55 Syncope and collapse: Secondary | ICD-10-CM | POA: Diagnosis not present

## 2019-02-22 ENCOUNTER — Encounter: Payer: PPO | Admitting: Speech Pathology

## 2019-02-22 ENCOUNTER — Encounter: Payer: PPO | Admitting: Occupational Therapy

## 2019-02-22 ENCOUNTER — Ambulatory Visit: Payer: PPO

## 2019-02-24 ENCOUNTER — Encounter: Payer: PPO | Admitting: Occupational Therapy

## 2019-02-24 ENCOUNTER — Encounter: Payer: PPO | Admitting: Speech Pathology

## 2019-02-25 ENCOUNTER — Encounter: Payer: PPO | Admitting: Speech Pathology

## 2019-02-28 ENCOUNTER — Ambulatory Visit: Payer: PPO | Admitting: Physical Therapy

## 2019-03-01 ENCOUNTER — Encounter: Payer: PPO | Admitting: Speech Pathology

## 2019-03-02 ENCOUNTER — Telehealth: Payer: Self-pay | Admitting: Family Medicine

## 2019-03-02 ENCOUNTER — Encounter: Payer: PPO | Admitting: Speech Pathology

## 2019-03-02 ENCOUNTER — Ambulatory Visit (INDEPENDENT_AMBULATORY_CARE_PROVIDER_SITE_OTHER): Payer: PPO | Admitting: Pharmacist

## 2019-03-02 ENCOUNTER — Ambulatory Visit: Payer: PPO | Admitting: Physical Therapy

## 2019-03-02 ENCOUNTER — Encounter: Payer: PPO | Admitting: Occupational Therapy

## 2019-03-02 DIAGNOSIS — I6310 Cerebral infarction due to embolism of unspecified precerebral artery: Secondary | ICD-10-CM

## 2019-03-02 DIAGNOSIS — E119 Type 2 diabetes mellitus without complications: Secondary | ICD-10-CM

## 2019-03-02 MED ORDER — METFORMIN HCL 500 MG PO TABS: 1000.0000 mg | ORAL_TABLET | Freq: Two times a day (BID) | ORAL | 2 refills | Status: DC

## 2019-03-02 NOTE — Chronic Care Management (AMB) (Signed)
Chronic Care Management   Follow Up Note   03/02/2019 Name: Andrew Cobb MRN: [id number] DOB: Dec 05, 1950  Referred by: Guadalupe Maple, MD Reason for referral : Chronic Care Management (Medication Management)   Andrew Cobb is a 68 y.o. year old male who is a primary care patient of Crissman, Jeannette How, MD. The CCM team was consulted for assistance with chronic disease management and care coordination needs.    Contacted patient for medication management follow up.  Review of patient status, including review of consultants reports, relevant laboratory and other test results, and collaboration with appropriate care team members and the patient's provider was performed as part of comprehensive patient evaluation and provision of chronic care management services.    SDOH (Social Determinants of Health) screening performed today: Physical Activity. See Care Plan for related entries.   Outpatient Encounter Medications as of 03/02/2019  Medication Sig Note  . amitriptyline (ELAVIL) 25 MG tablet Take 1 tablet (25 mg total) by mouth at bedtime.   [name] Kitchen aspirin EC 81 MG EC tablet Take 1 tablet (81 mg total) by mouth daily.   [name] Kitchen atorvastatin (LIPITOR) 40 MG tablet Take 40 mg by mouth at bedtime.    . benazepril (LOTENSIN) 40 MG tablet Take 0.5 tablets (20 mg total) by mouth daily. (Patient taking differently: Take 40 mg by mouth daily. ) 01/28/2019: QAM  . cetirizine (ZYRTEC) 10 MG tablet Take 10 mg by mouth daily.   . Cholecalciferol (VITAMIN D) 2000 units CAPS Take 2,000 Units by mouth daily.   [name] Kitchen glucose blood test strip 1 each by Other route 2 (two) times daily. DX E11.9   . Lancets (ONETOUCH ULTRASOFT) lancets 1 each by Other route 2 (two) times daily. Dx E11.9   . Magnesium 250 MG TABS Take 250 mg by mouth at bedtime.    . metFORMIN (GLUCOPHAGE) 500 MG tablet Take 1 tablet (500 mg total) by mouth 2 (two) times daily with a meal.   . Multiple Vitamins-Minerals (MULTIVITAMIN PO) Take 1 tablet  by mouth daily.    . valACYclovir (VALTREX) 500 MG tablet Take 1 tablet (500 mg total) by mouth daily. (Patient taking differently: Take 500 mg by mouth at bedtime. )   . clopidogrel (PLAVIX) 75 MG tablet clopidogrel 75 mg tablet    No facility-administered encounter medications on file as of 03/02/2019.      Goals Addressed            This Visit's Progress     Patient Stated   . "I want to work on my diabetes" (pt-stated)       Current Barriers:  . Diabetes: uncontrolled, most recent A1c 7.7%  o Admitted 8/4-02/10/2019 for stroke-like symptoms, new MCA branch infarct, per neurology this is 4th stroke in 2020 o Patient has speech therapy upcoming in the next 2 weeks  . Current antihyperglycemic regimen: metformin 500 mg BID . Current exercise: Notes that he walks 1.5 miles QAM  . Current blood glucose readings: 30 day fasting average 148 . Cardiovascular risk reduction: o Current hypertensive regimen: benazepril 40 mg daily, BP well controlled at home 120s/70s o Current hyperlipidemia regimen: atorvastatin 80 mg daily, last LDL very well controlled <70; confirmed that patient is taking ASA 81 mg daily and clopidogrel is being held d/t recent hemorrhagic stroke  Pharmacist Clinical Goal(s):  [name] Kitchen Over the next 90 days, patient with work with PharmD and primary care provider to address optimized hyperglycemic management  Interventions: . Comprehensive medication review  performed . Messaged Dr. Jeananne Rama to recommend increasing metformin to 1000 mg BID. Neurology recommending goal A1c <6.5%. . Patient denies any mobility/functional concerns at this time. If needed moving forward, will refer to RN CM  Patient Self Care Activities:  . Patient will check blood glucose daily, document, and provide at future appointments . Patient will take medications as prescribed . Patient will report any questions or concerns to provider   Please see past updates related to this goal by clicking on the  "Past Updates" button in the selected goal          Plan: - Will collaborate with Dr. Jeananne Rama on the above - Will outreach patient in ~4-5 weeks for continued medication management concerns  Catie Darnelle Maffucci, PharmD Clinical Pharmacist [place] [phone]

## 2019-03-02 NOTE — Telephone Encounter (Signed)
-----   Message from De Hollingshead, [facility] sent at 03/02/2019 10:04 AM EDT ----- Dr. Jeananne Rama,   Given neurology recommending a more stringent A1c goal of <6.5%, thoughts on increasing metformin to 1000 mg BID?  Thanks! Catie

## 2019-03-02 NOTE — Patient Instructions (Signed)
Visit Information  Goals Addressed            This Visit's Progress     Patient Stated   . "I want to work on my diabetes" (pt-stated)       Current Barriers:  . Diabetes: uncontrolled, most recent A1c 7.7%  o Admitted 8/4-02/10/2019 for stroke-like symptoms, new MCA branch infarct, per neurology this is 4th stroke in 2020 o Patient has speech therapy upcoming in the next 2 weeks  . Current antihyperglycemic regimen: metformin 500 mg BID . Current exercise: Notes that he walks 1.5 miles QAM  . Current blood glucose readings: 30 day fasting average 148 . Cardiovascular risk reduction: o Current hypertensive regimen: benazepril 40 mg daily, BP well controlled at home 120s/70s o Current hyperlipidemia regimen: atorvastatin 80 mg daily, last LDL very well controlled <70; confirmed that patient is taking ASA 81 mg daily and clopidogrel is being held d/t recent hemorrhagic stroke  Pharmacist Clinical Goal(s):  [name] Kitchen Over the next 90 days, patient with work with PharmD and primary care provider to address optimized hyperglycemic management  Interventions: . Comprehensive medication review performed . Messaged Dr. Jeananne Rama to recommend increasing metformin to 1000 mg BID. Neurology recommending goal A1c <6.5%. . Patient denies any mobility/functional concerns at this time. If needed moving forward, will refer to RN CM  Patient Self Care Activities:  . Patient will check blood glucose daily, document, and provide at future appointments . Patient will take medications as prescribed . Patient will report any questions or concerns to provider   Please see past updates related to this goal by clicking on the "Past Updates" button in the selected goal         The patient verbalized understanding of instructions provided today and declined a print copy of patient instruction materials.   Plan: - Will collaborate with Dr. Jeananne Rama on the above - Will outreach patient in ~4-5 weeks for  continued medication management concerns  Catie Darnelle Maffucci, PharmD Clinical Pharmacist [place] [phone]

## 2019-03-03 ENCOUNTER — Other Ambulatory Visit: Payer: PPO

## 2019-03-03 ENCOUNTER — Encounter: Payer: PPO | Admitting: Speech Pathology

## 2019-03-03 DIAGNOSIS — I619 Nontraumatic intracerebral hemorrhage, unspecified: Secondary | ICD-10-CM | POA: Diagnosis not present

## 2019-03-07 ENCOUNTER — Encounter: Payer: PPO | Admitting: Occupational Therapy

## 2019-03-07 ENCOUNTER — Ambulatory Visit: Payer: PPO | Admitting: Physical Therapy

## 2019-03-08 ENCOUNTER — Ambulatory Visit: Payer: Self-pay | Admitting: Pharmacist

## 2019-03-08 ENCOUNTER — Encounter: Payer: PPO | Admitting: Speech Pathology

## 2019-03-08 DIAGNOSIS — E119 Type 2 diabetes mellitus without complications: Secondary | ICD-10-CM

## 2019-03-08 NOTE — Chronic Care Management (AMB) (Signed)
  Chronic Care Management   Note  03/08/2019 Name: COSMOS RAUSCHENBACH MRN: [id number] DOB: 1950/09/13  SALIM ALONGE is a 68 y.o. year old male who is a primary care patient of Crissman, Jeannette How, MD. The CCM team was consulted for assistance with chronic disease management and care coordination needs.    Received call from patient. He is requesting that prescription for metformin be resent to [facility] to be better covered on [employer] insurance. Will make Dr. Jeananne Rama aware.   Catie Darnelle Maffucci, PharmD Clinical Pharmacist [place] [phone]

## 2019-03-09 ENCOUNTER — Ambulatory Visit: Payer: PPO | Admitting: Physical Therapy

## 2019-03-09 ENCOUNTER — Encounter: Payer: PPO | Admitting: Occupational Therapy

## 2019-03-09 MED ORDER — METFORMIN HCL 500 MG PO TABS: 1000.0000 mg | ORAL_TABLET | Freq: Two times a day (BID) | ORAL | 2 refills | Status: DC

## 2019-03-09 NOTE — Addendum Note (Signed)
Addended by: Golden Pop A on: 03/09/2019 12:00 PM   Modules accepted: Orders

## 2019-03-10 ENCOUNTER — Encounter: Payer: PPO | Admitting: Speech Pathology

## 2019-03-15 ENCOUNTER — Encounter: Payer: PPO | Admitting: Speech Pathology

## 2019-03-15 ENCOUNTER — Ambulatory Visit (INDEPENDENT_AMBULATORY_CARE_PROVIDER_SITE_OTHER): Payer: PPO | Admitting: *Deleted

## 2019-03-15 DIAGNOSIS — I639 Cerebral infarction, unspecified: Secondary | ICD-10-CM | POA: Diagnosis not present

## 2019-03-16 ENCOUNTER — Other Ambulatory Visit: Payer: Self-pay

## 2019-03-16 ENCOUNTER — Ambulatory Visit (INDEPENDENT_AMBULATORY_CARE_PROVIDER_SITE_OTHER): Payer: PPO

## 2019-03-16 DIAGNOSIS — Z23 Encounter for immunization: Secondary | ICD-10-CM | POA: Diagnosis not present

## 2019-03-16 LAB — CUP PACEART REMOTE DEVICE CHECK
Date Time Interrogation Session: 20200908220937
Implantable Pulse Generator Implant Date: 20200316

## 2019-03-17 ENCOUNTER — Encounter: Payer: PPO | Admitting: Speech Pathology

## 2019-03-22 ENCOUNTER — Encounter: Payer: PPO | Admitting: Speech Pathology

## 2019-03-24 ENCOUNTER — Encounter: Payer: PPO | Admitting: Speech Pathology

## 2019-03-28 ENCOUNTER — Ambulatory Visit (INDEPENDENT_AMBULATORY_CARE_PROVIDER_SITE_OTHER): Payer: PPO | Admitting: Neurology

## 2019-03-28 ENCOUNTER — Other Ambulatory Visit: Payer: Self-pay

## 2019-03-28 DIAGNOSIS — R55 Syncope and collapse: Secondary | ICD-10-CM | POA: Diagnosis not present

## 2019-03-29 ENCOUNTER — Other Ambulatory Visit: Payer: Self-pay

## 2019-03-29 ENCOUNTER — Ambulatory Visit: Payer: PPO | Attending: Neurology | Admitting: Speech Pathology

## 2019-03-29 ENCOUNTER — Encounter: Payer: PPO | Admitting: Speech Pathology

## 2019-03-29 DIAGNOSIS — R4701 Aphasia: Secondary | ICD-10-CM | POA: Insufficient documentation

## 2019-03-30 ENCOUNTER — Encounter: Payer: Self-pay | Admitting: Speech Pathology

## 2019-03-30 ENCOUNTER — Other Ambulatory Visit: Payer: Self-pay

## 2019-03-30 NOTE — Therapy (Signed)
[place] MAIN [facility] SERVICES [address] [place], [name], [address] Phone: [phone]   Fax:  [phone]  Speech Language Pathology Evaluation  Patient Details  Name: Andrew Cobb MRN: [id number] Date of Birth: 07-Sep-1950 Referring Provider (SLP): Sethi,Promod   Encounter Date: 03/29/2019  End of Session - 03/30/19 1407    Visit Number  1    Number of Visits  25    Date for SLP Re-Evaluation  06/21/19    Authorization Type  Medicare    Authorization Time Period  03/29/2019    Authorization - Visit Number  1    Authorization - Number of Visits  10    SLP Start Time  1600    SLP Stop Time   1700    SLP Time Calculation (min)  60 min    Activity Tolerance  Patient tolerated treatment well       Past Medical History:  Diagnosis Date  . Arthritis   . Asthma   . Cancer [facility])    prostate  . Chronic venous insufficiency    varicose vein lower extremity with inflammation  . Coronary artery disease 1996   two stents placed   . Diabetes mellitus without complication ([place])    type 2 on metformin  . GERD (gastroesophageal reflux disease)    no issues since gastric bypass surgery as stated per pt  . Hyperlipidemia   . Hypogonadism in male   . MRSA (methicillin resistant Staphylococcus aureus) infection    07/30/2008 thru 08/07/2008  . Sleep apnea    on BIPAP  . Stented coronary artery   . Stroke ([place])   . Thrombocythemia [facility])     Past Surgical History:  Procedure Laterality Date  . ANGIOPLASTY    . ANGIOPLASTY     with stent 04/07/1995  . ANTERIOR CERVICAL DECOMPRESSION/DISCECTOMY FUSION 4 LEVELS N/A 08/17/2017   Procedure: Anterior discectomy with fusion and plate fixation Cervical Three-Four, Four-Five, Five-Six, and Six-Seven Fusion;  Surgeon: Ditty, Kevan Ny, MD;  Location: [place];  Service: Neurosurgery;  Laterality: N/A;  Anterior discectomy with fusion and plate fixation Cervical Three-Four, Four-Five, Five-Six, and  Six-Seven Fusion   . APPENDECTOMY     1966  . BUBBLE STUDY  09/20/2018   Procedure: BUBBLE STUDY;  Surgeon: Josue Hector, MD;  Location: [place];  Service: Cardiovascular;;  . [place]  . CARDIOVASCULAR STRESS TEST     07/31/2011  . CARPAL TUNNEL RELEASE Left   . CHOLECYSTECTOMY     2006  . colonscopy      08/25/2012  . EYE SURGERY Bilateral    cataract  . FUNCTIONAL ENDOSCOPIC SINUS SURGERY     11/10/2013  . GASTRIC BYPASS     10/05/2012  . HERNIA REPAIR     left inguinal 1981  . INCISION AND DRAINAGE ABSCESS Right 02/26/2017   Procedure: INCISION AND DRAINAGE ABSCESS;  Surgeon: Nickie Retort, MD;  Location: [facility];  Service: Urology;  Laterality: Right;  . JOINT REPLACEMENT     bilateral  . left ankle surgery      05/03/2003   . left carpel tunnel      09/18/1993  . left knee meniscal tear      01/25/2010  . left knee meniscal tear repair      05/04/1996  . left rotator cuff repair      05/03/2003   . LOOP RECORDER INSERTION N/A 09/20/2018   Procedure: LOOP RECORDER INSERTION;  Surgeon: Evans Lance, MD;  Location: [place] CV LAB;  Service: Cardiovascular;  Laterality: N/A;  . REPLACEMENT TOTAL KNEE BILATERAL  07/13/2015  . right ankle surgery      fracture has 2 screws 07/07/1997  . right carpel tunnel      05/16/1992  . right shoulder replacement      01/27/2006  . SCROTAL EXPLORATION Right 02/26/2017   Procedure: SCROTUM EXPLORATION;  Surgeon: Nickie Retort, MD;  Location: [facility];  Service: Urology;  Laterality: Right;  . TEE WITHOUT CARDIOVERSION N/A 09/20/2018   Procedure: TRANSESOPHAGEAL ECHOCARDIOGRAM (TEE);  Surgeon: Josue Hector, MD;  Location: [facility];  Service: Cardiovascular;  Laterality: N/A;  . TOTAL KNEE ARTHROPLASTY Left 07/13/2015   Procedure: LEFT TOTAL KNEE ARTHROPLASTY;  Surgeon: Gaynelle Arabian, MD;  Location: [facility];  Service: Orthopedics;  Laterality: Left;    There were no vitals filed for this  visit.      SLP Evaluation [facility] - 03/30/19 0001      SLP Visit Information   SLP Received On  03/29/19    Referring Provider (SLP)  Sethi,Promod    Onset Date  01/17/2019    Medical Diagnosis  CVA      Subjective   Subjective  The patient was alert, pleasant, and cooperative throughout the speech therapy evaluation. He was outgoing and appeared to find joy in communicating with the clinicians in spite of his challenges.    Patient/Family Stated Goal  Patient states "My speech has come a long way, but I still can't get it the way I want it, and I get frustrated." He would like to maximize his expressive language abilities.      Pain Assessment   Currently in Pain?  No/denies      General Information   HPI  Andrew Cobb is a 68 year old male S/P multiple CVAs (MRI/CT imaging taken 09/18/2018, 10/01/2018, 01/17/2019, and 02/09/2019) with resultant aphasia. Most recent brain MRI on 02/09/2019 indicated chronic ischemic microangiopathy and chronic hemosiderin deposition at the site of the posterior left MCA territory hemorrhage. The patient participated in 2 ST treatment sessions at this outpatient clinic in 01/2019. He does not report any speech-language or cognitive difficulties prior to the CVAs he has experienced over the past 6 months.      Prior Functional Status   Cognitive/Linguistic Baseline  Within functional limits      Cognition   Overall Cognitive Status  Within Functional Limits for tasks assessed      Auditory Comprehension   Overall Auditory Comprehension  Impaired      Reading Comprehension   Reading Status  Impaired    Paragraph Level  76-100% accurate      Expression   Primary Mode of Expression  Verbal      Verbal Expression   Overall Verbal Expression  Impaired      Written Expression   Written Expression  Exceptions to [facility]      Oral Motor/Sensory Function   Overall Oral Motor/Sensory Function  Appears within functional limits for tasks assessed      Motor  Speech   Overall Motor Speech  Appears within functional limits for tasks assessed      Standardized Assessments   Standardized Assessments   Western Aphasia Battery revised       Western Aphasia Battery- Revised  Spontaneous Speech      Information content  9/10       Fluency   8/10  Comprehension     Yes/No questions  57/60        Auditory Word Recognition 60/60        Sequential Commands 41/80    Repetition   84/100     Naming    Object Naming  60/60        Word Fluency   17/20        Sentence Completion 10/10        Responsive Speech  10/10     Aphasia Quotient  86/100   Reading and Writing    Reading   94/100       Writing   85/100 Language Quotient  86.7/100   SLP Education - 03/30/19 1406    Education Details  Evaluation results and recommendations were shared with the patient at the conclusion of the session. The patient verbalized understanding and agreed to participate in speech therapy treatment to address language deficits.    Person(s) Educated  Patient    Methods  Explanation    Comprehension  Verbalized understanding         SLP Long Term Goals - 03/30/19 1411      SLP LONG TERM GOAL #1   Title  Patient will name common objects, without extraneous/irrelevant circumlocution, with 80% accuracy.    Time  4    Period  Weeks    Status  New    Target Date  04/26/19      SLP LONG TERM GOAL #2   Title  Patient will generate grammatical and cogent sentence to complete simple/concrete linguistic task with 80% accuracy.    Time  8    Period  Weeks    Status  New    Target Date  05/24/19      SLP LONG TERM GOAL #3   Title  Patient will complete high level word finding tasks with 80% accuracy.    Time  12    Period  Weeks    Status  New    Target Date  06/21/19      SLP LONG TERM GOAL #4   Title  Patient will complete complex auditory attention/vigilance/memory tasks with 80% accuracy.    Time  12      SLP LONG TERM GOAL #5   Target Date  06/21/19       SLP LONG TERM GOAL #6   Title  Patient will demonstrate reading comprehension for complex/abstract written information with 80% accuracy.    Time  12    Period  Weeks    Status  New    Target Date  06/21/19       Plan - 03/30/19 1409    Clinical Impression Statement  The patient, S/P multiple CVAs, presents with mild aphasia characterized by expressive aphasia (relatively fluent, circumlocutory speech with word finding difficulties and irrelevant/egocentric language); receptive aphasia (impaired comprehension of lengthy/complex language); difficulty repeating complex phrases and sentences; and writing errors (spelling and paraphasic errors). The patient would benefit from skilled speech therapy intervention for training in compensatory strategy use and restorative treatment of aphasia.    Speech Therapy Frequency  2x / week    Duration  Other (comment)   12 weeks   Treatment/Interventions  Language facilitation;SLP instruction and feedback;Patient/family education    Potential to Achieve Goals  Good    Potential Considerations  Ability to learn/carryover information;Previous level of function;Co-morbidities;Severity of impairments;Cooperation/participation level;Family/community support    Consulted and Agree with Plan of Care  Patient  Patient will benefit from skilled therapeutic intervention in order to improve the following deficits and impairments:   Aphasia - Plan: SLP plan of care cert/re-cert    Problem List Patient Active Problem List   Diagnosis Date Noted  . Syncope 02/10/2019  . Abrasion of knee, bilateral 02/08/2019  . Bradycardia with 31-40 beats per minute 02/08/2019  . Diabetes mellitus without complication ([place])   . Chronic venous insufficiency   . Cancer ([place])   . Asthma   . Arthritis   . GERD (gastroesophageal reflux disease)   . Hypogonadism in male   . MRSA (methicillin resistant Staphylococcus aureus) infection   . Sleep apnea   . Stented  coronary artery   . Stroke ([place])   . Thrombocythemia ([place])   . 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 Acute ischemic left MCA stroke ([place]) 09/18/2018  . Coronary artery disease involving native coronary artery of native heart without angina pectoris   . H/O right coronary artery stent placement   . Allergic rhinitis 08/19/2018  . Cervical radiculopathy 08/17/2017  . Advanced care planning/counseling discussion 07/21/2017  . Skin lesion of right arm 12/16/2016  . Prostate cancer ([place]) 12/08/2016  . H/O bariatric surgery 07/17/2016  . Essential hypertension 04/24/2015  . Uncontrolled type 2 diabetes mellitus with hyperglycemia, without long-term current use of insulin ([place]) 04/24/2015  . Chronic sinusitis 08/13/2009  . Hyperlipidemia 04/02/2009  . Coronary atherosclerosis 04/02/2009  . 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 OSA (obstructive sleep apnea) 04/02/2009  . Arthritis of shoulder region, degenerative 04/02/2009  . Coronary artery disease 1996   Leroy Sea, MS/CCC- SLP  Lou Miner 03/30/2019, 2:17 PM  [place] MAIN [facility] SERVICES [address] [place], [name], [address] Phone: [phone]   Fax:  [phone]  Name: Andrew Cobb MRN: [id number] Date of Birth: 17-Aug-1950

## 2019-03-31 ENCOUNTER — Encounter: Payer: PPO | Admitting: Speech Pathology

## 2019-03-31 NOTE — Progress Notes (Signed)
Carelink Summary Report / Loop Recorder 

## 2019-04-01 ENCOUNTER — Ambulatory Visit (INDEPENDENT_AMBULATORY_CARE_PROVIDER_SITE_OTHER): Payer: PPO | Admitting: Pharmacist

## 2019-04-01 DIAGNOSIS — I6310 Cerebral infarction due to embolism of unspecified precerebral artery: Secondary | ICD-10-CM

## 2019-04-01 DIAGNOSIS — E119 Type 2 diabetes mellitus without complications: Secondary | ICD-10-CM

## 2019-04-01 NOTE — Patient Instructions (Signed)
Visit Information  Goals Addressed            This Visit's Progress     Patient Stated   . "I want to work on my diabetes" (pt-stated)       Current Barriers:  . Diabetes: uncontrolled, most recent A1c 7.7% ; goal <6.5% per neurology  o Admitted 8/4-02/10/2019 for stroke-like symptoms, new MCA branch infarct, per neurology this is 4th stroke in 2020 o Patient notes he established with speech therapy  . Current antihyperglycemic regimen: metformin 1000 mg BID (recently increased) o Denies any GI upset w/ dose increase . Current exercise: Notes that he walks 1.5 miles QAM  . Current blood glucose readings: Reports fastings in 130s . Cardiovascular risk reduction: o Current hypertensive regimen: benazepril 40 mg daily, BP well controlled at home 120s/70s o Current hyperlipidemia regimen: atorvastatin 80 mg daily, last LDL very well controlled <70; confirmed that patient is taking ASA 81 mg daily and clopidogrel was d/c d/t recent hemorrhagic stroke  Pharmacist Clinical Goal(s):  [name] Kitchen Over the next 90 days, patient with work with PharmD and primary care provider to address optimized hyperglycemic management  Interventions: . Comprehensive medication review performed. Medication list updated in electronic health record.  . Congratulated patient on improvement in blood sugar readings w/ increased metformin dose. Recommend A1c in November; if not at goal on max metformin dose, recommend addition of SGLT2 d/t CV risk reduction benefit.  . Encouraged continued medication adherence. Per outside med review, patient is up to date on fills for metformin, atorvastatin, and benzepril.   Patient Self Care Activities:  . Patient will check blood glucose daily, document, and provide at future appointments . Patient will take medications as prescribed . Patient will report any questions or concerns to provider   Please see past updates related to this goal by clicking on the "Past Updates" button in the  selected goal         The patient verbalized understanding of instructions provided today and declined a print copy of patient instruction materials.    Plan: - Will outreach patient in ~6-8 weeks for continued medication management support  Catie Darnelle Maffucci, PharmD Clinical Pharmacist [place] [phone]

## 2019-04-01 NOTE — Chronic Care Management (AMB) (Signed)
Chronic Care Management   Follow Up Note   04/01/2019 Name: Andrew Cobb MRN: [id number] DOB: 10-30-1950  Referred by: Guadalupe Maple, MD Reason for referral : Chronic Care Management (Medication Management )   Andrew Cobb is a 68 y.o. year old male who is a primary care patient of Crissman, Jeannette How, MD. The CCM team was consulted for assistance with chronic disease management and care coordination needs.    Contacted patient for medication management follow up today.   Review of patient status, including review of consultants reports, relevant laboratory and other test results, and collaboration with appropriate care team members and the patient's provider was performed as part of comprehensive patient evaluation and provision of chronic care management services.    SDOH (Social Determinants of Health) screening performed today: Physical Activity. See Care Plan for related entries.   Advanced Directives Status: N See Care Plan and [facility]yn[facility] application for related entries.  Outpatient Encounter Medications as of 04/01/2019  Medication Sig Note  . amitriptyline (ELAVIL) 25 MG tablet Take 1 tablet (25 mg total) by mouth at bedtime.   [name] Kitchen aspirin EC 81 MG EC tablet Take 1 tablet (81 mg total) by mouth daily.   [name] Kitchen atorvastatin (LIPITOR) 40 MG tablet Take 40 mg by mouth at bedtime.    . benazepril (LOTENSIN) 40 MG tablet Take 0.5 tablets (20 mg total) by mouth daily. (Patient taking differently: Take 40 mg by mouth daily. ) 01/28/2019: QAM  . cetirizine (ZYRTEC) 10 MG tablet Take 10 mg by mouth daily.   . Cholecalciferol (VITAMIN D) 2000 units CAPS Take 2,000 Units by mouth daily.   [name] Kitchen glucose blood test strip 1 each by Other route 2 (two) times daily. DX E11.9   . Lancets (ONETOUCH ULTRASOFT) lancets 1 each by Other route 2 (two) times daily. Dx E11.9   . Magnesium 250 MG TABS Take 250 mg by mouth at bedtime.    . metFORMIN (GLUCOPHAGE) 500 MG tablet Take 2 tablets (1,000 mg total) by  mouth 2 (two) times daily with a meal.   . Multiple Vitamins-Minerals (MULTIVITAMIN PO) Take 1 tablet by mouth daily.    . valACYclovir (VALTREX) 500 MG tablet Take 1 tablet (500 mg total) by mouth daily. (Patient taking differently: Take 500 mg by mouth at bedtime. )   . [DISCONTINUED] clopidogrel (PLAVIX) 75 MG tablet clopidogrel 75 mg tablet    No facility-administered encounter medications on file as of 04/01/2019.      Goals Addressed            This Visit's Progress     Patient Stated   . "I want to work on my diabetes" (pt-stated)       Current Barriers:  . Diabetes: uncontrolled, most recent A1c 7.7% ; goal <6.5% per neurology  o Admitted 8/4-02/10/2019 for stroke-like symptoms, new MCA branch infarct, per neurology this is 4th stroke in 2020 o Patient notes he established with speech therapy  . Current antihyperglycemic regimen: metformin 1000 mg BID (recently increased) o Denies any GI upset w/ dose increase . Current exercise: Notes that he walks 1.5 miles QAM  . Current blood glucose readings: Reports fastings in 130s . Cardiovascular risk reduction: o Current hypertensive regimen: benazepril 40 mg daily, BP well controlled at home 120s/70s o Current hyperlipidemia regimen: atorvastatin 80 mg daily, last LDL very well controlled <70; confirmed that patient is taking ASA 81 mg daily and clopidogrel was d/c d/t recent hemorrhagic stroke  Pharmacist Clinical  Goal(s):  [name] Kitchen Over the next 90 days, patient with work with PharmD and primary care provider to address optimized hyperglycemic management  Interventions: . Comprehensive medication review performed. Medication list updated in electronic health record.  . Congratulated patient on improvement in blood sugar readings w/ increased metformin dose. Recommend A1c in November; if not at goal on max metformin dose, recommend addition of SGLT2 d/t CV risk reduction benefit.  . Encouraged continued medication adherence. Per outside  med review, patient is up to date on fills for metformin, atorvastatin, and benzepril.   Patient Self Care Activities:  . Patient will check blood glucose daily, document, and provide at future appointments . Patient will take medications as prescribed . Patient will report any questions or concerns to provider   Please see past updates related to this goal by clicking on the "Past Updates" button in the selected goal          Plan: - Will outreach patient in ~6-8 weeks for continued medication management support  Catie Darnelle Maffucci, PharmD Clinical Pharmacist [place] [phone]

## 2019-04-03 DIAGNOSIS — I619 Nontraumatic intracerebral hemorrhage, unspecified: Secondary | ICD-10-CM | POA: Diagnosis not present

## 2019-04-04 ENCOUNTER — Other Ambulatory Visit: Payer: Self-pay | Admitting: Family Medicine

## 2019-04-04 ENCOUNTER — Encounter: Payer: Self-pay | Admitting: Speech Pathology

## 2019-04-04 ENCOUNTER — Other Ambulatory Visit: Payer: Self-pay

## 2019-04-04 ENCOUNTER — Ambulatory Visit: Payer: PPO | Admitting: Speech Pathology

## 2019-04-04 DIAGNOSIS — R4701 Aphasia: Secondary | ICD-10-CM | POA: Diagnosis not present

## 2019-04-04 MED ORDER — CANAGLIFLOZIN 300 MG PO TABS: 300.0000 mg | ORAL_TABLET | Freq: Every day | ORAL | 3 refills | Status: DC

## 2019-04-04 NOTE — Progress Notes (Signed)
Diabetes treatment Also to help with cardiovascular and renal protection [employer] sent to patient's drugstore left message on the patient's phone.

## 2019-04-04 NOTE — Therapy (Signed)
[place] MAIN [facility] SERVICES [address] [place], [name], [address] Phone: [phone]   Fax:  [phone]  Speech Language Pathology Treatment  Patient Details  Name: Andrew Cobb MRN: [id number] Date of Birth: 10/13/1950 Referring Provider (SLP): Sethi,Promod   Encounter Date: 04/04/2019  End of Session - 04/04/19 1746    Visit Number  2    Number of Visits  25    Date for SLP Re-Evaluation  06/21/19    Authorization Type  Medicare    Authorization Time Period  03/29/2019    Authorization - Visit Number  2    Authorization - Number of Visits  10    SLP Start Time  1500    SLP Stop Time   1550    SLP Time Calculation (min)  50 min    Activity Tolerance  Patient tolerated treatment well       Past Medical History:  Diagnosis Date  . Arthritis   . Asthma   . Cancer [facility])    prostate  . Chronic venous insufficiency    varicose vein lower extremity with inflammation  . Coronary artery disease 1996   two stents placed   . Diabetes mellitus without complication ([place])    type 2 on metformin  . GERD (gastroesophageal reflux disease)    no issues since gastric bypass surgery as stated per pt  . Hyperlipidemia   . Hypogonadism in male   . MRSA (methicillin resistant Staphylococcus aureus) infection    07/30/2008 thru 08/07/2008  . Sleep apnea    on BIPAP  . Stented coronary artery   . Stroke ([place])   . Thrombocythemia [facility])     Past Surgical History:  Procedure Laterality Date  . ANGIOPLASTY    . ANGIOPLASTY     with stent 04/07/1995  . ANTERIOR CERVICAL DECOMPRESSION/DISCECTOMY FUSION 4 LEVELS N/A 08/17/2017   Procedure: Anterior discectomy with fusion and plate fixation Cervical Three-Four, Four-Five, Five-Six, and Six-Seven Fusion;  Surgeon: Ditty, Kevan Ny, MD;  Location: [place];  Service: Neurosurgery;  Laterality: N/A;  Anterior discectomy with fusion and plate fixation Cervical Three-Four, Four-Five, Five-Six, and  Six-Seven Fusion   . APPENDECTOMY     1966  . BUBBLE STUDY  09/20/2018   Procedure: BUBBLE STUDY;  Surgeon: Josue Hector, MD;  Location: [place];  Service: Cardiovascular;;  . [place]  . CARDIOVASCULAR STRESS TEST     07/31/2011  . CARPAL TUNNEL RELEASE Left   . CHOLECYSTECTOMY     2006  . colonscopy      08/25/2012  . EYE SURGERY Bilateral    cataract  . FUNCTIONAL ENDOSCOPIC SINUS SURGERY     11/10/2013  . GASTRIC BYPASS     10/05/2012  . HERNIA REPAIR     left inguinal 1981  . INCISION AND DRAINAGE ABSCESS Right 02/26/2017   Procedure: INCISION AND DRAINAGE ABSCESS;  Surgeon: Nickie Retort, MD;  Location: [facility];  Service: Urology;  Laterality: Right;  . JOINT REPLACEMENT     bilateral  . left ankle surgery      05/03/2003   . left carpel tunnel      09/18/1993  . left knee meniscal tear      01/25/2010  . left knee meniscal tear repair      05/04/1996  . left rotator cuff repair      05/03/2003   . LOOP RECORDER INSERTION N/A 09/20/2018   Procedure: LOOP RECORDER INSERTION;  Surgeon: Evans Lance, MD;  Location: [place] [facility];  Service: Cardiovascular;  Laterality: N/A;  . REPLACEMENT TOTAL KNEE BILATERAL  07/13/2015  . right ankle surgery      fracture has 2 screws 07/07/1997  . right carpel tunnel      05/16/1992  . right shoulder replacement      01/27/2006  . SCROTAL EXPLORATION Right 02/26/2017   Procedure: SCROTUM EXPLORATION;  Surgeon: Nickie Retort, MD;  Location: [facility];  Service: Urology;  Laterality: Right;  . TEE WITHOUT CARDIOVERSION N/A 09/20/2018   Procedure: TRANSESOPHAGEAL ECHOCARDIOGRAM (TEE);  Surgeon: Josue Hector, MD;  Location: [facility];  Service: Cardiovascular;  Laterality: N/A;  . TOTAL KNEE ARTHROPLASTY Left 07/13/2015   Procedure: LEFT TOTAL KNEE ARTHROPLASTY;  Surgeon: Gaynelle Arabian, MD;  Location: [facility];  Service: Orthopedics;  Laterality: Left;    There were no vitals filed for this  visit.  Subjective Assessment - 04/04/19 1745    Subjective  The patient is eager to improve verbal expression skills            ADULT SLP TREATMENT - 04/04/19 0001      General Information   Behavior/Cognition  Alert;Cooperative;Pleasant mood;Hard of hearing    HPI  Pt is a 68 y.o. male with history of thrombocythemia, stroke, stent in coronary artery, hyperlidemia, diabetes, CAD. S/P (01/17/2019) ICH (intracerebral hemorrhage) (HCC) L tempoparietal infarct w. petechial hemorrhage and new L parietal hemorrhage into old L MCA infarct      Treatment Provided   Treatment provided  Cognitive-Linquistic      Pain Assessment   Pain Assessment  No/denies pain      Cognitive-Linquistic Treatment   Treatment focused on  Aphasia    Skilled Treatment  VERBAL EXPRESSION: Rapid name pictures without extraneous verbalizations with 80% accuracy.  Patient generates simple cogent phrases given simple picture with 80% accuracy.  When the complexity of the picture stimulus increases the patient becomes dysfluent and disjointed.  Patient names item given verbal description/definition with 75% accuracy.      Assessment / Recommendations / Plan   Plan  Continue with current plan of care      Progression Toward Goals   Progression toward goals  Progressing toward goals       SLP Education - 04/04/19 1745    Education Details  Try to finish your thoughts rather than changing word choice    Person(s) Educated  Patient    Methods  Explanation    Comprehension  Verbalized understanding         SLP Long Term Goals - 03/30/19 1411      SLP LONG TERM GOAL #1   Title  Patient will name common objects, without extraneous/irrelevant circumlocution, with 80% accuracy.    Time  4    Period  Weeks    Status  New    Target Date  04/26/19      SLP LONG TERM GOAL #2   Title  Patient will generate grammatical and cogent sentence to complete simple/concrete linguistic task with 80% accuracy.    Time   8    Period  Weeks    Status  New    Target Date  05/24/19      SLP LONG TERM GOAL #3   Title  Patient will complete high level word finding tasks with 80% accuracy.    Time  12    Period  Weeks    Status  New  Target Date  06/21/19      SLP LONG TERM GOAL #4   Title  Patient will complete complex auditory attention/vigilance/memory tasks with 80% accuracy.    Time  12      SLP LONG TERM GOAL #5   Target Date  06/21/19      SLP LONG TERM GOAL #6   Title  Patient will demonstrate reading comprehension for complex/abstract written information with 80% accuracy.    Time  12    Period  Weeks    Status  New    Target Date  06/21/19       Plan - 04/04/19 1746    Clinical Impression Statement  The patient is completing simple verbal expression tasks well, as the complexity increases, he experiences more dysfluency, word finding errors, and stopping and changing tactics.    Speech Therapy Frequency  2x / week    Duration  Other (comment)    Treatment/Interventions  Language facilitation;SLP instruction and feedback;Patient/family education    Potential to Achieve Goals  Good    Potential Considerations  Ability to learn/carryover information;Previous level of function;Co-morbidities;Severity of impairments;Cooperation/participation level;Family/community support    Consulted and Agree with Plan of Care  Patient       Patient will benefit from skilled therapeutic intervention in order to improve the following deficits and impairments:   Aphasia    Problem List Patient Active Problem List   Diagnosis Date Noted  . Syncope 02/10/2019  . Abrasion of knee, bilateral 02/08/2019  . Bradycardia with 31-40 beats per minute 02/08/2019  . Diabetes mellitus without complication ([place])   . Chronic venous insufficiency   . Cancer ([employer])   . Asthma   . Arthritis   . GERD (gastroesophageal reflux disease)   . Hypogonadism in male   . MRSA (methicillin resistant Staphylococcus aureus)  infection   . Sleep apnea   . Stented coronary artery   . Stroke ([place])   . Thrombocythemia ([place])   . 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 Acute ischemic left MCA stroke ([place]) 09/18/2018  . Coronary artery disease involving native coronary artery of native heart without angina pectoris   . H/O right coronary artery stent placement   . Allergic rhinitis 08/19/2018  . Cervical radiculopathy 08/17/2017  . Advanced care planning/counseling discussion 07/21/2017  . Skin lesion of right arm 12/16/2016  . Prostate cancer ([place]) 12/08/2016  . H/O bariatric surgery 07/17/2016  . Essential hypertension 04/24/2015  . Uncontrolled type 2 diabetes mellitus with hyperglycemia, without long-term current use of insulin ([place]) 04/24/2015  . Chronic sinusitis 08/13/2009  . Hyperlipidemia 04/02/2009  . Coronary atherosclerosis 04/02/2009  . 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 OSA (obstructive sleep apnea) 04/02/2009  . Arthritis of shoulder region, degenerative 04/02/2009  . Coronary artery disease 1996   Leroy Sea, MS/CCC- SLP  Lou Miner 04/04/2019, 5:48 PM  [place] MAIN [facility] SERVICES [address] [place], [name], [address] Phone: [phone]   Fax:  [phone]   Name: Andrew Cobb MRN: [id number] Date of Birth: March 13, 1951

## 2019-04-05 ENCOUNTER — Encounter: Payer: PPO | Admitting: Speech Pathology

## 2019-04-06 ENCOUNTER — Encounter: Payer: Self-pay | Admitting: Speech Pathology

## 2019-04-06 ENCOUNTER — Ambulatory Visit: Payer: PPO | Admitting: Speech Pathology

## 2019-04-06 ENCOUNTER — Other Ambulatory Visit: Payer: Self-pay

## 2019-04-06 DIAGNOSIS — R4701 Aphasia: Secondary | ICD-10-CM | POA: Diagnosis not present

## 2019-04-06 NOTE — Therapy (Signed)
[place] MAIN [facility] SERVICES [address] [place], [name], [address] Phone: [phone]   Fax:  [phone]  Speech Language Pathology Treatment  Patient Details  Name: Andrew Cobb MRN: [id number] Date of Birth: Nov 07, 1950 Referring Provider (SLP): Sethi,Promod   Encounter Date: 04/06/2019  End of Session - 04/06/19 1623    Visit Number  3    Number of Visits  25    Date for SLP Re-Evaluation  06/21/19    Authorization Type  Medicare    Authorization Time Period  03/29/2019    Authorization - Visit Number  3    Authorization - Number of Visits  10    SLP Start Time  1500    SLP Stop Time   1550    SLP Time Calculation (min)  50 min    Activity Tolerance  Patient tolerated treatment well       Past Medical History:  Diagnosis Date  . Arthritis   . Asthma   . Cancer [facility])    prostate  . Chronic venous insufficiency    varicose vein lower extremity with inflammation  . Coronary artery disease 1996   two stents placed   . Diabetes mellitus without complication ([place])    type 2 on metformin  . GERD (gastroesophageal reflux disease)    no issues since gastric bypass surgery as stated per pt  . Hyperlipidemia   . Hypogonadism in male   . MRSA (methicillin resistant Staphylococcus aureus) infection    07/30/2008 thru 08/07/2008  . Sleep apnea    on BIPAP  . Stented coronary artery   . Stroke ([place])   . Thrombocythemia [facility])     Past Surgical History:  Procedure Laterality Date  . ANGIOPLASTY    . ANGIOPLASTY     with stent 04/07/1995  . ANTERIOR CERVICAL DECOMPRESSION/DISCECTOMY FUSION 4 LEVELS N/A 08/17/2017   Procedure: Anterior discectomy with fusion and plate fixation Cervical Three-Four, Four-Five, Five-Six, and Six-Seven Fusion;  Surgeon: Ditty, Kevan Ny, MD;  Location: [place];  Service: Neurosurgery;  Laterality: N/A;  Anterior discectomy with fusion and plate fixation Cervical Three-Four, Four-Five, Five-Six, and  Six-Seven Fusion   . APPENDECTOMY     1966  . BUBBLE STUDY  09/20/2018   Procedure: BUBBLE STUDY;  Surgeon: Josue Hector, MD;  Location: [place];  Service: Cardiovascular;;  . [place]  . CARDIOVASCULAR STRESS TEST     07/31/2011  . CARPAL TUNNEL RELEASE Left   . CHOLECYSTECTOMY     2006  . colonscopy      08/25/2012  . EYE SURGERY Bilateral    cataract  . FUNCTIONAL ENDOSCOPIC SINUS SURGERY     11/10/2013  . GASTRIC BYPASS     10/05/2012  . HERNIA REPAIR     left inguinal 1981  . INCISION AND DRAINAGE ABSCESS Right 02/26/2017   Procedure: INCISION AND DRAINAGE ABSCESS;  Surgeon: Nickie Retort, MD;  Location: [facility];  Service: Urology;  Laterality: Right;  . JOINT REPLACEMENT     bilateral  . left ankle surgery      05/03/2003   . left carpel tunnel      09/18/1993  . left knee meniscal tear      01/25/2010  . left knee meniscal tear repair      05/04/1996  . left rotator cuff repair      05/03/2003   . LOOP RECORDER INSERTION N/A 09/20/2018   Procedure: LOOP RECORDER INSERTION;  Surgeon: Evans Lance, MD;  Location: [place] [facility];  Service: Cardiovascular;  Laterality: N/A;  . REPLACEMENT TOTAL KNEE BILATERAL  07/13/2015  . right ankle surgery      fracture has 2 screws 07/07/1997  . right carpel tunnel      05/16/1992  . right shoulder replacement      01/27/2006  . SCROTAL EXPLORATION Right 02/26/2017   Procedure: SCROTUM EXPLORATION;  Surgeon: Nickie Retort, MD;  Location: [facility];  Service: Urology;  Laterality: Right;  . TEE WITHOUT CARDIOVERSION N/A 09/20/2018   Procedure: TRANSESOPHAGEAL ECHOCARDIOGRAM (TEE);  Surgeon: Josue Hector, MD;  Location: [facility];  Service: Cardiovascular;  Laterality: N/A;  . TOTAL KNEE ARTHROPLASTY Left 07/13/2015   Procedure: LEFT TOTAL KNEE ARTHROPLASTY;  Surgeon: Gaynelle Arabian, MD;  [facility];  Service: Orthopedics;  Laterality: Left;    There were no vitals filed for this  visit.  Subjective Assessment - 04/06/19 1621    Subjective  The patient was alert, cooperative, and pleasant throughout the therapy session. He shared about plans for his family's upcoming beach trip.            ADULT SLP TREATMENT - 04/06/19 0001      General Information   Behavior/Cognition  Alert;Cooperative;Pleasant mood;Hard of hearing    HPI  Pt is a 68 y.o. male with history of thrombocythemia, stroke, stent in coronary artery, hyperlidemia, diabetes, CAD. S/P (01/17/2019) ICH (intracerebral hemorrhage) (HCC) L tempoparietal infarct w. petechial hemorrhage and new L parietal hemorrhage into old L MCA infarct      Treatment Provided   Treatment provided  Cognitive-Linquistic      Pain Assessment   Pain Assessment  No/denies pain      Cognitive-Linquistic Treatment   Treatment focused on  Aphasia    Skilled Treatment  Patient completed semantic feature analysis task with 76% accuracy independently. Given minimal cueing, accuracy improved to 100%. During a high level word finding task, the patient achieved 75% without interventions. Given semantic/phonemic cueing, accuracy increased to 92%. The patient completed a complex auditory attention/vigilance/memory task with 80% accuracy independently. Given minimal cueing, accuracy improved to 96%. Patient completed a simple, concrete linguistic task by generating cogent, grammatically correct sentences with 60% accuracy without skilled interventions. Given minimal cueing, accuracy improved to 70%.       Assessment / Recommendations / Plan   Plan  Continue with current plan of care      Progression Toward Goals   Progression toward goals  Progressing toward goals       SLP Education - 04/06/19 1621    Education Details  Take your time to finish your thoughts.    Person(s) Educated  Patient    Methods  Explanation    Comprehension  Verbalized understanding         SLP Long Term Goals - 03/30/19 1411      SLP LONG TERM GOAL  #1   Title  Patient will name common objects, without extraneous/irrelevant circumlocution, with 80% accuracy.    Time  4    Period  Weeks    Status  New    Target Date  04/26/19      SLP LONG TERM GOAL #2   Title  Patient will generate grammatical and cogent sentence to complete simple/concrete linguistic task with 80% accuracy.    Time  8    Period  Weeks    Status  New    Target Date  05/24/19  SLP LONG TERM GOAL #3   Title  Patient will complete high level word finding tasks with 80% accuracy.    Time  12    Period  Weeks    Status  New    Target Date  06/21/19      SLP LONG TERM GOAL #4   Title  Patient will complete complex auditory attention/vigilance/memory tasks with 80% accuracy.    Time  12      SLP LONG TERM GOAL #5   Target Date  06/21/19      SLP LONG TERM GOAL #6   Title  Patient will demonstrate reading comprehension for complex/abstract written information with 80% accuracy.    Time  12    Period  Weeks    Status  New    Target Date  06/21/19       Plan - 04/06/19 1623    Clinical Impression Statement  The patient is able to complete simple verbal expression tasks with minimal disfluency. As task complexity increases, he exhibits increased disfluency and word finding difficulties. The patient frequently evades word finding errors using extraneous circumlocution/stopping and changing tactics. He reports that communication with his spouse has become especially difficult, as "irritability" negatively impacts his fluency and word finding abilities.    Speech Therapy Frequency  2x / week    Duration  Other (comment)    Treatment/Interventions  Language facilitation;SLP instruction and feedback;Patient/family education    Potential to Achieve Goals  Good    Potential Considerations  Ability to learn/carryover information;Previous level of function;Co-morbidities;Severity of impairments;Cooperation/participation level;Family/community support    Consulted and  Agree with Plan of Care  Patient       Patient will benefit from skilled therapeutic intervention in order to improve the following deficits and impairments:   Aphasia    Problem List Patient Active Problem List   Diagnosis Date Noted  . Syncope 02/10/2019  . Abrasion of knee, bilateral 02/08/2019  . Bradycardia with 31-40 beats per minute 02/08/2019  . Diabetes mellitus without complication ([place])   . Chronic venous insufficiency   . Cancer ([place])   . Asthma   . Arthritis   . GERD (gastroesophageal reflux disease)   . Hypogonadism in male   . MRSA (methicillin resistant Staphylococcus aureus) infection   . Sleep apnea   . Stented coronary artery   . Stroke ([place])   . Thrombocythemia ([place])   . 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 Acute ischemic left MCA stroke ([place]) 09/18/2018  . Coronary artery disease involving native coronary artery of native heart without angina pectoris   . H/O right coronary artery stent placement   . Allergic rhinitis 08/19/2018  . Cervical radiculopathy 08/17/2017  . Advanced care planning/counseling discussion 07/21/2017  . Skin lesion of right arm 12/16/2016  . Prostate cancer ([place]) 12/08/2016  . H/O bariatric surgery 07/17/2016  . Essential hypertension 04/24/2015  . Uncontrolled type 2 diabetes mellitus with hyperglycemia, without long-term current use of insulin ([place]) 04/24/2015  . Chronic sinusitis 08/13/2009  . Hyperlipidemia 04/02/2009  . Coronary atherosclerosis 04/02/2009  . 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 OSA (obstructive sleep apnea) 04/02/2009  . Arthritis of shoulder region, degenerative 04/02/2009  . Coronary artery disease 1996   Karalynn Cottone A. Francis Dowse., Graduate Clinician Vella Kohler 04/06/2019, 4:24 PM  [place] MAIN [facility] SERVICES [place],  [name], [address] Phone: [phone]   Fax:  [phone]   Name: STEPHON DERNBACH MRN: [id number] Date of Birth: [birth year]

## 2019-04-06 NOTE — Therapy (Signed)
[place] MAIN [facility] SERVICES [address] [place], [name], [address] Phone: [phone]   Fax:  [phone]  Speech Language Pathology Treatment    The information in this patient note, response to treatment, and overall treatment plan developed has been reviewed and agreed upon by this clinician.  Orinda Kenner, [place], [place] [phone] 04/06/19,4:31 PM    Patient Details  Name: Andrew Cobb MRN: [id number] Date of Birth: [birth year] Referring Provider (SLP): Sethi,Promod   Encounter Date: 04/06/2019  End of Session - 04/06/19 1623    Visit Number  3    Number of Visits  25    Date for SLP Re-Evaluation  06/21/19    Authorization Type  Medicare    Authorization Time Period  03/29/2019    Authorization - Visit Number  3    Authorization - Number of Visits  10    SLP Start Time  1500    SLP Stop Time   1550    SLP Time Calculation (min)  50 min    Activity Tolerance  Patient tolerated treatment well       Past Medical History:  Diagnosis Date  . Arthritis   . Asthma   . Cancer [facility])    prostate  . Chronic venous insufficiency    varicose vein lower extremity with inflammation  . Coronary artery disease 1996   two stents placed   . Diabetes mellitus without complication ([place])    type 2 on metformin  . GERD (gastroesophageal reflux disease)    no issues since gastric bypass surgery as stated per pt  . Hyperlipidemia   . Hypogonadism in male   . MRSA (methicillin resistant Staphylococcus aureus) infection    07/30/2008 thru 08/07/2008  . Sleep apnea    on BIPAP  . Stented coronary artery   . Stroke ([place])   . Thrombocythemia [facility])     Past Surgical History:  Procedure Laterality Date  . ANGIOPLASTY    . ANGIOPLASTY     with stent 04/07/1995  . ANTERIOR CERVICAL DECOMPRESSION/DISCECTOMY FUSION 4 LEVELS N/A 08/17/2017   Procedure: Anterior discectomy with fusion and plate fixation Cervical Three-Four, Four-Five,  Five-Six, and Six-Seven Fusion;  Surgeon: Ditty, Kevan Ny, MD;  Location: [place];  Service: Neurosurgery;  Laterality: N/A;  Anterior discectomy with fusion and plate fixation Cervical Three-Four, Four-Five, Five-Six, and Six-Seven Fusion   . APPENDECTOMY     1966  . BUBBLE STUDY  09/20/2018   Procedure: BUBBLE STUDY;  Surgeon: Josue Hector, MD;  Location: [place];  Service: Cardiovascular;;  . [place]  . CARDIOVASCULAR STRESS TEST     07/31/2011  . CARPAL TUNNEL RELEASE Left   . CHOLECYSTECTOMY     2006  . colonscopy      08/25/2012  . EYE SURGERY Bilateral    cataract  . FUNCTIONAL ENDOSCOPIC SINUS SURGERY     11/10/2013  . GASTRIC BYPASS     10/05/2012  . HERNIA REPAIR     left inguinal 1981  . INCISION AND DRAINAGE ABSCESS Right 02/26/2017   Procedure: INCISION AND DRAINAGE ABSCESS;  Surgeon: Nickie Retort, MD;  Location: [facility];  Service: Urology;  Laterality: Right;  . JOINT REPLACEMENT     bilateral  . left ankle surgery      05/03/2003   . left carpel tunnel      09/18/1993  . left knee meniscal tear      01/25/2010  . left  knee meniscal tear repair      05/04/1996  . left rotator cuff repair      05/03/2003   . LOOP RECORDER INSERTION N/A 09/20/2018   Procedure: LOOP RECORDER INSERTION;  Surgeon: Evans Lance, MD;  Location: [place] CV LAB;  Service: Cardiovascular;  Laterality: N/A;  . REPLACEMENT TOTAL KNEE BILATERAL  07/13/2015  . right ankle surgery      fracture has 2 screws 07/07/1997  . right carpel tunnel      05/16/1992  . right shoulder replacement      01/27/2006  . SCROTAL EXPLORATION Right 02/26/2017   Procedure: SCROTUM EXPLORATION;  Surgeon: Nickie Retort, MD;  Location: [facility];  Service: Urology;  Laterality: Right;  . TEE WITHOUT CARDIOVERSION N/A 09/20/2018   Procedure: TRANSESOPHAGEAL ECHOCARDIOGRAM (TEE);  Surgeon: Josue Hector, MD;  Location: [facility] ENDOSCOPY;  Service: Cardiovascular;   Laterality: N/A;  . TOTAL KNEE ARTHROPLASTY Left 07/13/2015   Procedure: LEFT TOTAL KNEE ARTHROPLASTY;  Surgeon: Gaynelle Arabian, MD;  [facility];  Service: Orthopedics;  Laterality: Left;    There were no vitals filed for this visit.  Subjective Assessment - 04/06/19 1621    Subjective  The patient was alert, cooperative, and pleasant throughout the therapy session. He shared about plans for his family's upcoming beach trip.            ADULT SLP TREATMENT - 04/06/19 0001      General Information   Behavior/Cognition  Alert;Cooperative;Pleasant mood;Hard of hearing    HPI  Pt is a 68 y.o. male with history of thrombocythemia, stroke, stent in coronary artery, hyperlidemia, diabetes, CAD. S/P (01/17/2019) ICH (intracerebral hemorrhage) (HCC) L tempoparietal infarct w. petechial hemorrhage and new L parietal hemorrhage into old L MCA infarct      Treatment Provided   Treatment provided  Cognitive-Linquistic      Pain Assessment   Pain Assessment  No/denies pain      Cognitive-Linquistic Treatment   Treatment focused on  Aphasia    Skilled Treatment  Patient completed semantic feature analysis task with 76% accuracy independently. Given minimal cueing, accuracy improved to 100%. During a high level word finding task, the patient achieved 75% without interventions. Given semantic/phonemic cueing, accuracy increased to 92%. The patient completed a complex auditory attention/vigilance/memory task with 80% accuracy independently. Given minimal cueing, accuracy improved to 96%. Patient completed a simple, concrete linguistic task by generating cogent, grammatically correct sentences with 60% accuracy without skilled interventions. Given minimal cueing, accuracy improved to 70%.       Assessment / Recommendations / Plan   Plan  Continue with current plan of care      Progression Toward Goals   Progression toward goals  Progressing toward goals       SLP Education - 04/06/19 1621     Education Details  Take your time to finish your thoughts.    Person(s) Educated  Patient    Methods  Explanation    Comprehension  Verbalized understanding         SLP Long Term Goals - 03/30/19 1411      SLP LONG TERM GOAL #1   Title  Patient will name common objects, without extraneous/irrelevant circumlocution, with 80% accuracy.    Time  4    Period  Weeks    Status  New    Target Date  04/26/19      SLP LONG TERM GOAL #2   Title  Patient will generate grammatical  and cogent sentence to complete simple/concrete linguistic task with 80% accuracy.    Time  8    Period  Weeks    Status  New    Target Date  05/24/19      SLP LONG TERM GOAL #3   Title  Patient will complete high level word finding tasks with 80% accuracy.    Time  12    Period  Weeks    Status  New    Target Date  06/21/19      SLP LONG TERM GOAL #4   Title  Patient will complete complex auditory attention/vigilance/memory tasks with 80% accuracy.    Time  12      SLP LONG TERM GOAL #5   Target Date  06/21/19      SLP LONG TERM GOAL #6   Title  Patient will demonstrate reading comprehension for complex/abstract written information with 80% accuracy.    Time  12    Period  Weeks    Status  New    Target Date  06/21/19       Plan - 04/06/19 1623    Clinical Impression Statement  The patient is able to complete simple verbal expression tasks with minimal disfluency. As task complexity increases, he exhibits increased disfluency and word finding difficulties. The patient frequently evades word finding errors using extraneous circumlocution/stopping and changing tactics. He reports that communication with his spouse has become especially difficult, as "irritability" negatively impacts his fluency and word finding abilities.    Speech Therapy Frequency  2x / week    Duration  Other (comment)    Treatment/Interventions  Language facilitation;SLP instruction and feedback;Patient/family education     Potential to Achieve Goals  Good    Potential Considerations  Ability to learn/carryover information;Previous level of function;Co-morbidities;Severity of impairments;Cooperation/participation level;Family/community support    Consulted and Agree with Plan of Care  Patient       Patient will benefit from skilled therapeutic intervention in order to improve the following deficits and impairments:   Aphasia    Problem List Patient Active Problem List   Diagnosis Date Noted  . Syncope 02/10/2019  . Abrasion of knee, bilateral 02/08/2019  . Bradycardia with 31-40 beats per minute 02/08/2019  . Diabetes mellitus without complication ([place])   . Chronic venous insufficiency   . Cancer ([place])   . Asthma   . Arthritis   . GERD (gastroesophageal reflux disease)   . Hypogonadism in male   . MRSA (methicillin resistant Staphylococcus aureus) infection   . Sleep apnea   . Stented coronary artery   . Stroke ([place])   . Thrombocythemia ([place])   . 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 Acute ischemic left MCA stroke ([place]) 09/18/2018  . Coronary artery disease involving native coronary artery of native heart without angina pectoris   . H/O right coronary artery stent placement   . Allergic rhinitis 08/19/2018  . Cervical radiculopathy 08/17/2017  . Advanced care planning/counseling discussion 07/21/2017  . Skin lesion of right arm 12/16/2016  . Prostate cancer ([place]) 12/08/2016  . H/O bariatric surgery 07/17/2016  . Essential hypertension 04/24/2015  . Uncontrolled type 2 diabetes mellitus with hyperglycemia, without long-term current use of insulin ([place]) 04/24/2015  . Chronic sinusitis 08/13/2009  . Hyperlipidemia 04/02/2009  . Coronary atherosclerosis 04/02/2009  . 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 OSA (obstructive sleep apnea) 04/02/2009  .  Arthritis of shoulder region,  degenerative 04/02/2009  . Coronary artery disease 1996    Elexia Friedt 04/06/2019, 4:30 PM  [place] MAIN [facility] SERVICES [address] [place], [name], [address] Phone: [phone]   Fax:  [phone]   Name: Andrew Cobb MRN: [id number] Date of Birth: [birth year]

## 2019-04-07 ENCOUNTER — Encounter: Payer: PPO | Admitting: Speech Pathology

## 2019-04-11 ENCOUNTER — Telehealth: Payer: Self-pay | Admitting: Family Medicine

## 2019-04-11 ENCOUNTER — Telehealth: Payer: Self-pay | Admitting: Neurology

## 2019-04-11 ENCOUNTER — Other Ambulatory Visit: Payer: Self-pay

## 2019-04-11 ENCOUNTER — Ambulatory Visit: Payer: PPO | Attending: Neurology | Admitting: Speech Pathology

## 2019-04-11 DIAGNOSIS — R4701 Aphasia: Secondary | ICD-10-CM | POA: Diagnosis not present

## 2019-04-11 NOTE — Telephone Encounter (Signed)
No driving since he had syncopal episode in Aug 2020. Keep appointment with NP in Nov and discuss with her

## 2019-04-11 NOTE — Telephone Encounter (Signed)
Call pt He will need to have neurology write that note.

## 2019-04-11 NOTE — Telephone Encounter (Signed)
I called pt that per Dr Leonie Man message note he had a sycnopal episode in August. I stated at Dr Leonie Man visit on 02/17/2019 he recommend no driving. I stated to keep appt with [name] NP in 05/2019 and it can be reassess at that appt. Pt verbalized understanding.

## 2019-04-11 NOTE — Telephone Encounter (Signed)
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 Please advise.

## 2019-04-11 NOTE — Telephone Encounter (Signed)
Patient notified and verbalized understanding. 

## 2019-04-11 NOTE — Telephone Encounter (Signed)
Copied from [place] [id number]. Topic: General - Other >> Apr 11, 2019  9:57 AM Keene Breath wrote: Reason for CRM: Patient called to request a note from the doctor stating that it is ok for him to drive after having a stroke 6 months ago.  Please advise and call patient to discuss.  CB# [phone]

## 2019-04-12 ENCOUNTER — Encounter: Payer: Self-pay | Admitting: Speech Pathology

## 2019-04-12 NOTE — Therapy (Signed)
[place] MAIN [facility] SERVICES [address] [place], [name], [address] Phone: [phone]   Fax:  [phone]  Speech Language Pathology Treatment  Patient Details  Name: Andrew Cobb MRN: [id number] Date of Birth: 1950/10/22 Referring Provider (SLP): Sethi,Promod   Encounter Date: 04/11/2019  End of Session - 04/12/19 0829    Visit Number  4    Number of Visits  25    Date for SLP Re-Evaluation  06/21/19    Authorization Type  Medicare    Authorization Time Period  03/29/2019    Authorization - Visit Number  4    Authorization - Number of Visits  10    SLP Start Time  1500    SLP Stop Time   1550    SLP Time Calculation (min)  50 min    Activity Tolerance  Patient tolerated treatment well       Past Medical History:  Diagnosis Date  . Arthritis   . Asthma   . Cancer [facility])    prostate  . Chronic venous insufficiency    varicose vein lower extremity with inflammation  . Coronary artery disease 1996   two stents placed   . Diabetes mellitus without complication ([place])    type 2 on metformin  . GERD (gastroesophageal reflux disease)    no issues since gastric bypass surgery as stated per pt  . Hyperlipidemia   . Hypogonadism in male   . MRSA (methicillin resistant Staphylococcus aureus) infection    07/30/2008 thru 08/07/2008  . Sleep apnea    on BIPAP  . Stented coronary artery   . Stroke ([place])   . Thrombocythemia [facility])     Past Surgical History:  Procedure Laterality Date  . ANGIOPLASTY    . ANGIOPLASTY     with stent 04/07/1995  . ANTERIOR CERVICAL DECOMPRESSION/DISCECTOMY FUSION 4 LEVELS N/A 08/17/2017   Procedure: Anterior discectomy with fusion and plate fixation Cervical Three-Four, Four-Five, Five-Six, and Six-Seven Fusion;  Surgeon: Ditty, Kevan Ny, MD;  Location: [place];  Service: Neurosurgery;  Laterality: N/A;  Anterior discectomy with fusion and plate fixation Cervical Three-Four, Four-Five, Five-Six, and  Six-Seven Fusion   . APPENDECTOMY     1966  . BUBBLE STUDY  09/20/2018   Procedure: BUBBLE STUDY;  Surgeon: Josue Hector, MD;  Location: [place];  Service: Cardiovascular;;  . [place]  . CARDIOVASCULAR STRESS TEST     07/31/2011  . CARPAL TUNNEL RELEASE Left   . CHOLECYSTECTOMY     2006  . colonscopy      08/25/2012  . EYE SURGERY Bilateral    cataract  . FUNCTIONAL ENDOSCOPIC SINUS SURGERY     11/10/2013  . GASTRIC BYPASS     10/05/2012  . HERNIA REPAIR     left inguinal 1981  . INCISION AND DRAINAGE ABSCESS Right 02/26/2017   Procedure: INCISION AND DRAINAGE ABSCESS;  Surgeon: Nickie Retort, MD;  Location: [facility];  Service: Urology;  Laterality: Right;  . JOINT REPLACEMENT     bilateral  . left ankle surgery      05/03/2003   . left carpel tunnel      09/18/1993  . left knee meniscal tear      01/25/2010  . left knee meniscal tear repair      05/04/1996  . left rotator cuff repair      05/03/2003   . LOOP RECORDER INSERTION N/A 09/20/2018   Procedure: LOOP RECORDER INSERTION;  Surgeon: Evans Lance, MD;  Location: [place] CV LAB;  Service: Cardiovascular;  Laterality: N/A;  . REPLACEMENT TOTAL KNEE BILATERAL  07/13/2015  . right ankle surgery      fracture has 2 screws 07/07/1997  . right carpel tunnel      05/16/1992  . right shoulder replacement      01/27/2006  . SCROTAL EXPLORATION Right 02/26/2017   Procedure: SCROTUM EXPLORATION;  Surgeon: Nickie Retort, MD;  Location: [facility];  Service: Urology;  Laterality: Right;  . TEE WITHOUT CARDIOVERSION N/A 09/20/2018   Procedure: TRANSESOPHAGEAL ECHOCARDIOGRAM (TEE);  Surgeon: Josue Hector, MD;  Location: [facility] ENDOSCOPY;  Service: Cardiovascular;  Laterality: N/A;  . TOTAL KNEE ARTHROPLASTY Left 07/13/2015   Procedure: LEFT TOTAL KNEE ARTHROPLASTY;  Surgeon: Gaynelle Arabian, MD;  [facility];  Service: Orthopedics;  Laterality: Left;    There were no vitals filed for this  visit.  Subjective Assessment - 04/12/19 0826    Subjective  The patient was alert, cooperative, and pleasant throughout the therapy session. He shared that he and his family enjoyed their weekend beach trip.            ADULT SLP TREATMENT - 04/12/19 0001      General Information   Behavior/Cognition  Alert;Cooperative;Pleasant mood;Hard of hearing    HPI  Pt is a 68 y.o. male with history of thrombocythemia, stroke, stent in coronary artery, hyperlidemia, diabetes, CAD. S/P (01/17/2019) ICH (intracerebral hemorrhage) (HCC) L tempoparietal infarct w. petechial hemorrhage and new L parietal hemorrhage into old L MCA infarct      Treatment Provided   Treatment provided  Cognitive-Linquistic      Pain Assessment   Pain Assessment  No/denies pain      Cognitive-Linquistic Treatment   Treatment focused on  Aphasia    Skilled Treatment  Patient named common actions without extraneous/irrelevant circumlocution with 89% accuracy independently. Given minimal cueing, accuracy improved to 94%. During a high level word finding task, the patient achieved 81% accuracy without interventions. Given semantic/phonemic cueing, accuracy increased to 93%. The patient completed a complex auditory attention/vigilance/memory task with 47% accuracy independently. Given repetitions and minimal cueing, accuracy improved to 93%. Patient completed a simple, concrete linguistic task by generating cogent, grammatically correct sentences with 70% accuracy without skilled interventions. Given minimal-moderate cueing, accuracy improved to 80%.       Assessment / Recommendations / Plan   Plan  Continue with current plan of care      Progression Toward Goals   Progression toward goals  Progressing toward goals       SLP Education - 04/12/19 0826    Education Details  Try to get the words out yourself rather than waiting for your listener to supply them for you.    Person(s) Educated  Patient    Methods   Explanation    Comprehension  Verbalized understanding         SLP Long Term Goals - 03/30/19 1411      SLP LONG TERM GOAL #1   Title  Patient will name common objects, without extraneous/irrelevant circumlocution, with 80% accuracy.    Time  4    Period  Weeks    Status  New    Target Date  04/26/19      SLP LONG TERM GOAL #2   Title  Patient will generate grammatical and cogent sentence to complete simple/concrete linguistic task with 80% accuracy.    Time  8  Period  Weeks    Status  New    Target Date  05/24/19      SLP LONG TERM GOAL #3   Title  Patient will complete high level word finding tasks with 80% accuracy.    Time  12    Period  Weeks    Status  New    Target Date  06/21/19      SLP LONG TERM GOAL #4   Title  Patient will complete complex auditory attention/vigilance/memory tasks with 80% accuracy.    Time  12      SLP LONG TERM GOAL #5   Target Date  06/21/19      SLP LONG TERM GOAL #6   Title  Patient will demonstrate reading comprehension for complex/abstract written information with 80% accuracy.    Time  12    Period  Weeks    Status  New    Target Date  06/21/19       Plan - 04/12/19 [id number]    Clinical Impression Statement  The patient is able to complete simple verbal expression tasks with minimal disfluency. As task complexity increases, he exhibits increased disfluency and word finding difficulties. The patient frequently attempts to evade word finding errors using extraneous circumlocution/stopping and changing tactics. 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    Speech Therapy Frequency  2x / week    Duration  Other (comment)    Treatment/Interventions  Language facilitation;SLP instruction and feedback;Patient/family education    Potential to Achieve  Goals  Good    Potential Considerations  Ability to learn/carryover information;Previous level of function;Co-morbidities;Severity of impairments;Cooperation/participation level;Family/community support    Consulted and Agree with Plan of Care  Patient       Patient will benefit from skilled therapeutic intervention in order to improve the following deficits and impairments:   Aphasia    Problem List Patient Active Problem List   Diagnosis Date Noted  . Syncope 02/10/2019  . Abrasion of knee, bilateral 02/08/2019  . Bradycardia with 31-40 beats per minute 02/08/2019  . Diabetes mellitus without complication ([place])   . Chronic venous insufficiency   . Cancer ([place])   . Asthma   . Arthritis   . GERD (gastroesophageal reflux disease)   . Hypogonadism in male   . MRSA (methicillin resistant Staphylococcus aureus) infection   . Sleep apnea   . Stented coronary artery   . Stroke ([place])   . Thrombocythemia ([place])   . 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 Acute ischemic left MCA stroke ([place]) 09/18/2018  . Coronary artery disease involving native coronary artery of native heart without angina pectoris   . H/O right coronary artery stent placement   . Allergic rhinitis 08/19/2018  . Cervical radiculopathy 08/17/2017  . Advanced care planning/counseling discussion 07/21/2017  . Skin lesion of right arm 12/16/2016  . Prostate cancer ([place]) 12/08/2016  . H/O bariatric surgery 07/17/2016  . Essential hypertension 04/24/2015  . Uncontrolled type 2 diabetes mellitus with hyperglycemia, without long-term current use of insulin ([place]) 04/24/2015  . Chronic sinusitis 08/13/2009  . Hyperlipidemia 04/02/2009  . Coronary atherosclerosis 04/02/2009  . 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 OSA (obstructive sleep apnea) 04/02/2009  . Arthritis  of shoulder region, degenerative 04/02/2009   . Coronary artery disease 1996   Aranda Bihm A. Francis Dowse., Graduate Clinician Vella Kohler 04/12/2019, 8:30 AM  [place] MAIN [facility] SERVICES [address] [place], [name], [address] Phone: [phone]   Fax:  [phone]   Name: ABHIRAM GOZA MRN: [id number] Date of Birth: Aug 08, 1950

## 2019-04-13 ENCOUNTER — Ambulatory Visit: Payer: PPO | Admitting: Speech Pathology

## 2019-04-13 ENCOUNTER — Other Ambulatory Visit: Payer: Self-pay

## 2019-04-13 ENCOUNTER — Encounter: Payer: Self-pay | Admitting: Speech Pathology

## 2019-04-13 DIAGNOSIS — R4701 Aphasia: Secondary | ICD-10-CM

## 2019-04-13 NOTE — Therapy (Signed)
[place] MAIN [facility] SERVICES [address] [place], [name], [address] Phone: [phone]   Fax:  [phone]  Speech Language Pathology Treatment  Patient Details  Name: Andrew Cobb MRN: [id number] Date of Birth: May 11, 1951 Referring Provider (SLP): Sethi,Promod   Encounter Date: 04/13/2019  End of Session - 04/13/19 1649    Visit Number  5    Number of Visits  25    Date for SLP Re-Evaluation  06/21/19    Authorization Type  Medicare    Authorization Time Period  03/29/2019    Authorization - Visit Number  5    Authorization - Number of Visits  10    SLP Start Time  1500    SLP Stop Time   1550    SLP Time Calculation (min)  50 min    Activity Tolerance  Patient tolerated treatment well       Past Medical History:  Diagnosis Date  . Arthritis   . Asthma   . Cancer [facility])    prostate  . Chronic venous insufficiency    varicose vein lower extremity with inflammation  . Coronary artery disease 1996   two stents placed   . Diabetes mellitus without complication ([place])    type 2 on metformin  . GERD (gastroesophageal reflux disease)    no issues since gastric bypass surgery as stated per pt  . Hyperlipidemia   . Hypogonadism in male   . MRSA (methicillin resistant Staphylococcus aureus) infection    07/30/2008 thru 08/07/2008  . Sleep apnea    on BIPAP  . Stented coronary artery   . Stroke ([place])   . Thrombocythemia [facility])     Past Surgical History:  Procedure Laterality Date  . ANGIOPLASTY    . ANGIOPLASTY     with stent 04/07/1995  . ANTERIOR CERVICAL DECOMPRESSION/DISCECTOMY FUSION 4 LEVELS N/A 08/17/2017   Procedure: Anterior discectomy with fusion and plate fixation Cervical Three-Four, Four-Five, Five-Six, and Six-Seven Fusion;  Surgeon: Ditty, Kevan Ny, MD;  Location: [place];  Service: Neurosurgery;  Laterality: N/A;  Anterior discectomy with fusion and plate fixation Cervical Three-Four, Four-Five, Five-Six, and  Six-Seven Fusion   . APPENDECTOMY     1966  . BUBBLE STUDY  09/20/2018   Procedure: BUBBLE STUDY;  Surgeon: Josue Hector, MD;  Location: [place];  Service: Cardiovascular;;  . [place]  . CARDIOVASCULAR STRESS TEST     07/31/2011  . CARPAL TUNNEL RELEASE Left   . CHOLECYSTECTOMY     2006  . colonscopy      08/25/2012  . EYE SURGERY Bilateral    cataract  . FUNCTIONAL ENDOSCOPIC SINUS SURGERY     11/10/2013  . GASTRIC BYPASS     10/05/2012  . HERNIA REPAIR     left inguinal 1981  . INCISION AND DRAINAGE ABSCESS Right 02/26/2017   Procedure: INCISION AND DRAINAGE ABSCESS;  Surgeon: Nickie Retort, MD;  Location: [facility];  Service: Urology;  Laterality: Right;  . JOINT REPLACEMENT     bilateral  . left ankle surgery      05/03/2003   . left carpel tunnel      09/18/1993  . left knee meniscal tear      01/25/2010  . left knee meniscal tear repair      05/04/1996  . left rotator cuff repair      05/03/2003   . LOOP RECORDER INSERTION N/A 09/20/2018   Procedure: LOOP RECORDER INSERTION;  Surgeon: Evans Lance, MD;  Location: [place] CV LAB;  Service: Cardiovascular;  Laterality: N/A;  . REPLACEMENT TOTAL KNEE BILATERAL  07/13/2015  . right ankle surgery      fracture has 2 screws 07/07/1997  . right carpel tunnel      05/16/1992  . right shoulder replacement      01/27/2006  . SCROTAL EXPLORATION Right 02/26/2017   Procedure: SCROTUM EXPLORATION;  Surgeon: Nickie Retort, MD;  Location: [facility];  Service: Urology;  Laterality: Right;  . TEE WITHOUT CARDIOVERSION N/A 09/20/2018   Procedure: TRANSESOPHAGEAL ECHOCARDIOGRAM (TEE);  Surgeon: Josue Hector, MD;  Location: [facility] ENDOSCOPY;  Service: Cardiovascular;  Laterality: N/A;  . TOTAL KNEE ARTHROPLASTY Left 07/13/2015   Procedure: LEFT TOTAL KNEE ARTHROPLASTY;  Surgeon: Gaynelle Arabian, MD;  Location: [facility];  Service: Orthopedics;  Laterality: Left;    There were no vitals filed for this  visit.  Subjective Assessment - 04/13/19 1647    Subjective  Pt was pleasant and cooperative with unfamiliar therapist    Currently in Pain?  Yes            ADULT SLP TREATMENT - 04/13/19 0001      General Information   Behavior/Cognition  Alert;Cooperative;Pleasant mood;Hard of hearing    HPI  Pt is a 68 y.o. male with history of thrombocythemia, stroke, stent in coronary artery, hyperlidemia, diabetes, CAD. S/P (01/17/2019) ICH (intracerebral hemorrhage) (HCC) L tempoparietal infarct w. petechial hemorrhage and new L parietal hemorrhage into old L MCA infarct      Treatment Provided   Treatment provided  Cognitive-Linquistic      Pain Assessment   Pain Assessment  No/denies pain      Cognitive-Linquistic Treatment   Treatment focused on  Aphasia    Skilled Treatment Pt was seen for skilled ST intervention targeting goals for improved communicative effectiveness. Pt accurately identified and corrected inconsistencies in sentences with min verbal assist. Pt provided 2 meanings of common words (iron, tape, cable, whip, mole) with min verbal assist. Mod assist given to verbalize 4 definitions of the word plant. Pt reports continued desire to improve communicative effectiveness, and is motivated during all treatment tasks.      Assessment / Recommendations / Plan   Plan  Continue with current plan of care      Progression Toward Goals   Progression toward goals  Progressing toward goals       SLP Education - 04/13/19 1648    Education Details  Fewer word finding issues and dysfluencies with open ended subjects, or topics pt initiates.    Person(s) Educated  Patient    Methods  Explanation    Comprehension  Verbalized understanding         SLP Long Term Goals - 03/30/19 1411      SLP LONG TERM GOAL #1   Title  Patient will name common objects, without extraneous/irrelevant circumlocution, with 80% accuracy.    Time  4    Period  Weeks    Status  New    Target Date   04/26/19      SLP LONG TERM GOAL #2   Title  Patient will generate grammatical and cogent sentence to complete simple/concrete linguistic task with 80% accuracy.    Time  8    Period  Weeks    Status  New    Target Date  05/24/19      SLP LONG TERM GOAL #3   Title  Patient will  complete high level word finding tasks with 80% accuracy.    Time  12    Period  Weeks    Status  New    Target Date  06/21/19      SLP LONG TERM GOAL #4   Title  Patient will complete complex auditory attention/vigilance/memory tasks with 80% accuracy.    Time  12      SLP LONG TERM GOAL #5   Target Date  06/21/19      SLP LONG TERM GOAL #6   Title  Patient will demonstrate reading comprehension for complex/abstract written information with 80% accuracy.    Time  12    Period  Weeks    Status  New    Target Date  06/21/19       Plan - 04/13/19 1650    Clinical Impression Statement  Pt continues to participate well in treatment sessions, and is motivated to improve communicative effectiveness. Continued ST intervention focused on established goals is recommended.    Speech Therapy Frequency  2x / week    Duration  Other (comment)    Treatment/Interventions  Language facilitation;SLP instruction and feedback;Patient/family education    Potential to Achieve Goals  Good    Potential Considerations  Ability to learn/carryover information;Previous level of function;Co-morbidities;Severity of impairments;Cooperation/participation level;Family/community support    SLP Home Exercise Plan  reviewed    Consulted and Agree with Plan of Care  Patient       Patient will benefit from skilled therapeutic intervention in order to improve the following deficits and impairments:   Aphasia    Problem List Patient Active Problem List   Diagnosis Date Noted  . Syncope 02/10/2019  . Abrasion of knee, bilateral 02/08/2019  . Bradycardia with 31-40 beats per minute 02/08/2019  . Diabetes mellitus without  complication ([place])   . Chronic venous insufficiency   . Cancer ([place])   . Asthma   . Arthritis   . GERD (gastroesophageal reflux disease)   . Hypogonadism in male   . MRSA (methicillin resistant Staphylococcus aureus) infection   . Sleep apnea   . Stented coronary artery   . Stroke ([place])   . Thrombocythemia ([place])   . 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 Acute ischemic left MCA stroke ([place]) 09/18/2018  . Coronary artery disease involving native coronary artery of native heart without angina pectoris   . H/O right coronary artery stent placement   . Allergic rhinitis 08/19/2018  . Cervical radiculopathy 08/17/2017  . Advanced care planning/counseling discussion 07/21/2017  . Skin lesion of right arm 12/16/2016  . Prostate cancer ([place]) 12/08/2016  . H/O bariatric surgery 07/17/2016  . Essential hypertension 04/24/2015  . Uncontrolled type 2 diabetes mellitus with hyperglycemia, without long-term current use of insulin ([place]) 04/24/2015  . Chronic sinusitis 08/13/2009  . Hyperlipidemia 04/02/2009  . Coronary atherosclerosis 04/02/2009  . 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 OSA (obstructive sleep apnea) 04/02/2009  . Arthritis of shoulder region, degenerative 04/02/2009  . Coronary artery disease 1996    B. Quentin Ore, [facility], CCC-SLP Speech Language Pathologist  Shonna Chock 04/13/2019, 4:59 PM  [place] MAIN [facility] SERVICES [address] [place], [name], [address] Phone: [phone]   Fax:  [phone]   Name: MAURION SCOVEL MRN: [id number] Date of Birth: 04/16/1951

## 2019-04-18 ENCOUNTER — Ambulatory Visit (INDEPENDENT_AMBULATORY_CARE_PROVIDER_SITE_OTHER): Payer: PPO | Admitting: *Deleted

## 2019-04-18 ENCOUNTER — Ambulatory Visit: Payer: PPO | Admitting: Speech Pathology

## 2019-04-18 ENCOUNTER — Other Ambulatory Visit: Payer: Self-pay

## 2019-04-18 ENCOUNTER — Telehealth: Payer: Self-pay

## 2019-04-18 DIAGNOSIS — R55 Syncope and collapse: Secondary | ICD-10-CM | POA: Diagnosis not present

## 2019-04-18 DIAGNOSIS — R4701 Aphasia: Secondary | ICD-10-CM

## 2019-04-18 LAB — CUP PACEART REMOTE DEVICE CHECK
Date Time Interrogation Session: 20201012174540
Implantable Pulse Generator Implant Date: 20200316

## 2019-04-18 NOTE — Telephone Encounter (Signed)
Left vm for patient to call back about Cardiac event monitor results.  ------

## 2019-04-18 NOTE — Telephone Encounter (Signed)
-----   Message from Garvin Fila, MD sent at 04/18/2019  2:09 PM EDT ----- Kindly inform the patient that cardiac event monitor did not show significant arrhythmias.  Kindly ask him if he has had any further episodes of fainting or near fainting.

## 2019-04-18 NOTE — Telephone Encounter (Signed)
Pt returned call. Please call back when available. 

## 2019-04-19 ENCOUNTER — Encounter: Payer: Self-pay | Admitting: Speech Pathology

## 2019-04-19 NOTE — Telephone Encounter (Signed)
I return pts call about cardiac event monitor. I stated it did not show any significant arrhythmias. I stated Dr.Sethi wanted to know if he has any episodes of fainting or near fainting. PT stated he had no passing out, syncope, or fainting spells while wearing the monitor. No episodes or issues as of today. PT verbalized understanding.

## 2019-04-19 NOTE — Therapy (Signed)
[place] MAIN [facility] SERVICES [address] [place], [name], [address] Phone: [phone]   Fax:  [phone]  Speech Language Pathology Treatment  Patient Details  Name: Andrew Cobb MRN: [id number] Date of Birth: 07/02/1951 Referring Provider (SLP): Sethi,Promod   Encounter Date: 04/18/2019  End of Session - 04/19/19 1548    Visit Number  6    Number of Visits  25    Date for SLP Re-Evaluation  06/21/19    Authorization Type  Medicare    Authorization Time Period  03/29/2019    Authorization - Visit Number  6    Authorization - Number of Visits  10    SLP Start Time  1500    SLP Stop Time   1550    SLP Time Calculation (min)  50 min    Activity Tolerance  Patient tolerated treatment well       Past Medical History:  Diagnosis Date  . Arthritis   . Asthma   . Cancer [facility])    prostate  . Chronic venous insufficiency    varicose vein lower extremity with inflammation  . Coronary artery disease 1996   two stents placed   . Diabetes mellitus without complication ([place])    type 2 on metformin  . GERD (gastroesophageal reflux disease)    no issues since gastric bypass surgery as stated per pt  . Hyperlipidemia   . Hypogonadism in male   . MRSA (methicillin resistant Staphylococcus aureus) infection    07/30/2008 thru 08/07/2008  . Sleep apnea    on BIPAP  . Stented coronary artery   . Stroke ([place])   . Thrombocythemia [facility])     Past Surgical History:  Procedure Laterality Date  . ANGIOPLASTY    . ANGIOPLASTY     with stent 04/07/1995  . ANTERIOR CERVICAL DECOMPRESSION/DISCECTOMY FUSION 4 LEVELS N/A 08/17/2017   Procedure: Anterior discectomy with fusion and plate fixation Cervical Three-Four, Four-Five, Five-Six, and Six-Seven Fusion;  Surgeon: Ditty, Kevan Ny, MD;  Location: [place];  Service: Neurosurgery;  Laterality: N/A;  Anterior discectomy with fusion and plate fixation Cervical Three-Four, Four-Five, Five-Six, and  Six-Seven Fusion   . APPENDECTOMY     1966  . BUBBLE STUDY  09/20/2018   Procedure: BUBBLE STUDY;  Surgeon: Josue Hector, MD;  Location: [place];  Service: Cardiovascular;;  . [place]  . CARDIOVASCULAR STRESS TEST     07/31/2011  . CARPAL TUNNEL RELEASE Left   . CHOLECYSTECTOMY     2006  . colonscopy      08/25/2012  . EYE SURGERY Bilateral    cataract  . FUNCTIONAL ENDOSCOPIC SINUS SURGERY     11/10/2013  . GASTRIC BYPASS     10/05/2012  . HERNIA REPAIR     left inguinal 1981  . INCISION AND DRAINAGE ABSCESS Right 02/26/2017   Procedure: INCISION AND DRAINAGE ABSCESS;  Surgeon: Nickie Retort, MD;  Location: [facility];  Service: Urology;  Laterality: Right;  . JOINT REPLACEMENT     bilateral  . left ankle surgery      05/03/2003   . left carpel tunnel      09/18/1993  . left knee meniscal tear      01/25/2010  . left knee meniscal tear repair      05/04/1996  . left rotator cuff repair      05/03/2003   . LOOP RECORDER INSERTION N/A 09/20/2018   Procedure: LOOP RECORDER INSERTION;  Surgeon: Evans Lance, MD;  Location: [place] CV LAB;  Service: Cardiovascular;  Laterality: N/A;  . REPLACEMENT TOTAL KNEE BILATERAL  07/13/2015  . right ankle surgery      fracture has 2 screws 07/07/1997  . right carpel tunnel      05/16/1992  . right shoulder replacement      01/27/2006  . SCROTAL EXPLORATION Right 02/26/2017   Procedure: SCROTUM EXPLORATION;  Surgeon: Nickie Retort, MD;  Location: [facility];  Service: Urology;  Laterality: Right;  . TEE WITHOUT CARDIOVERSION N/A 09/20/2018   Procedure: TRANSESOPHAGEAL ECHOCARDIOGRAM (TEE);  Surgeon: Josue Hector, MD;  [facility]: [facility];  Service: Cardiovascular;  Laterality: N/A;  . TOTAL KNEE ARTHROPLASTY Left 07/13/2015   Procedure: LEFT TOTAL KNEE ARTHROPLASTY;  Surgeon: Gaynelle Arabian, MD;  [facility];  Service: Orthopedics;  Laterality: Left;    There were no vitals filed for this  visit.  Subjective Assessment - 04/19/19 1546    Subjective  Pt was pleasant and cooperative and eager to improve verbal expression            ADULT SLP TREATMENT - 04/19/19 0001      General Information   Behavior/Cognition  Alert;Cooperative;Pleasant mood;Hard of hearing    HPI  Pt is a 68 y.o. male with history of thrombocythemia, stroke, stent in coronary artery, hyperlidemia, diabetes, CAD. S/P (01/17/2019) ICH (intracerebral hemorrhage) (HCC) L tempoparietal infarct w. petechial hemorrhage and new L parietal hemorrhage into old L MCA infarct      Treatment Provided   Treatment provided  Cognitive-Linquistic      Pain Assessment   Pain Assessment  No/denies pain      Cognitive-Linquistic Treatment   Treatment focused on  Aphasia    Skilled Treatment  VERBAL EXPRESSION:  Patient generates simple cogent phrases given simple picture with 80% accuracy.  When the complexity of the picture stimulus increases the patient becomes dysfluent and disjointed, structured questions elicits improved detail.  Patient identifies wrong item in list of 5 with 95% accuracy and generates coherent sentence to explain with 75% accuracy.  Name abstract categories given 3 members with 65% accuracy.        Assessment / Recommendations / Plan   Plan  Continue with current plan of care      Progression Toward Goals   Progression toward goals  Progressing toward goals       SLP Education - 04/19/19 1546    Education Details  Give yourself the time it takes for word retrieval    Person(s) Educated  Patient    Methods  Explanation    Comprehension  Verbalized understanding         SLP Long Term Goals - 03/30/19 1411      SLP LONG TERM GOAL #1   Title  Patient will name common objects, without extraneous/irrelevant circumlocution, with 80% accuracy.    Time  4    Period  Weeks    Status  New    Target Date  04/26/19      SLP LONG TERM GOAL #2   Title  Patient will generate grammatical and  cogent sentence to complete simple/concrete linguistic task with 80% accuracy.    Time  8    Period  Weeks    Status  New    Target Date  05/24/19      SLP LONG TERM GOAL #3   Title  Patient will complete high level word finding tasks with 80% accuracy.  Time  12    Period  Weeks    Status  New    Target Date  06/21/19      SLP LONG TERM GOAL #4   Title  Patient will complete complex auditory attention/vigilance/memory tasks with 80% accuracy.    Time  12      SLP LONG TERM GOAL #5   Target Date  06/21/19      SLP LONG TERM GOAL #6   Title  Patient will demonstrate reading comprehension for complex/abstract written information with 80% accuracy.    Time  12    Period  Weeks    Status  New    Target Date  06/21/19       Plan - 04/19/19 1549    Clinical Impression Statement  The patient is completing simple verbal expression tasks well, as the complexity increases, he experiences more dysfluency, word finding errors, and stopping and changing tactics.    Speech Therapy Frequency  2x / week    Duration  Other (comment)    Treatment/Interventions  Language facilitation;SLP instruction and feedback;Patient/family education    Potential to Achieve Goals  Good    Potential Considerations  Ability to learn/carryover information;Previous level of function;Co-morbidities;Severity of impairments;Cooperation/participation level;Family/community support    Consulted and Agree with Plan of Care  Patient       Patient will benefit from skilled therapeutic intervention in order to improve the following deficits and impairments:   Aphasia    Problem List Patient Active Problem List   Diagnosis Date Noted  . Syncope 02/10/2019  . Abrasion of knee, bilateral 02/08/2019  . Bradycardia with 31-40 beats per minute 02/08/2019  . Diabetes mellitus without complication ([place])   . Chronic venous insufficiency   . Cancer ([place])   . Asthma   . Arthritis   . GERD (gastroesophageal reflux  disease)   . Hypogonadism in male   . MRSA (methicillin resistant Staphylococcus aureus) infection   . Sleep apnea   . Stented coronary artery   . Stroke ([place])   . Thrombocythemia ([place])   . 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 Acute ischemic left MCA stroke ([place]) 09/18/2018  . Coronary artery disease involving native coronary artery of native heart without angina pectoris   . H/O right coronary artery stent placement   . Allergic rhinitis 08/19/2018  . Cervical radiculopathy 08/17/2017  . Advanced care planning/counseling discussion 07/21/2017  . Skin lesion of right arm 12/16/2016  . Prostate cancer ([place]) 12/08/2016  . H/O bariatric surgery 07/17/2016  . Essential hypertension 04/24/2015  . Uncontrolled type 2 diabetes mellitus with hyperglycemia, without long-term current use of insulin ([place]) 04/24/2015  . Chronic sinusitis 08/13/2009  . Hyperlipidemia 04/02/2009  . Coronary atherosclerosis 04/02/2009  . 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 OSA (obstructive sleep apnea) 04/02/2009  . Arthritis of shoulder region, degenerative 04/02/2009  . Coronary artery disease 1996   Leroy Sea, MS/CCC- SLP  Lou Miner 04/19/2019, 3:51 PM  [place] MAIN [facility] SERVICES [address] [place], [name], [address] Phone: [phone]   Fax:  [phone]   Name: Andrew Cobb MRN: [id number] Date of Birth: [birth year]

## 2019-04-20 ENCOUNTER — Other Ambulatory Visit: Payer: Self-pay

## 2019-04-20 ENCOUNTER — Ambulatory Visit: Payer: PPO | Admitting: Speech Pathology

## 2019-04-20 DIAGNOSIS — R4701 Aphasia: Secondary | ICD-10-CM | POA: Diagnosis not present

## 2019-04-21 ENCOUNTER — Encounter: Payer: Self-pay | Admitting: Speech Pathology

## 2019-04-21 NOTE — Therapy (Signed)
[place] MAIN [facility] SERVICES [address] [place], [name], [address] Phone: [phone]   Fax:  [phone]  Speech Language Pathology Treatment  Patient Details  Name: Andrew Cobb MRN: [id number] Date of Birth: 03/11/1951 Referring Provider (SLP): Sethi,Promod   Encounter Date: 04/20/2019  End of Session - 04/21/19 0817    Visit Number  7    Number of Visits  25    Date for SLP Re-Evaluation  06/21/19    Authorization Type  Medicare    Authorization Time Period  03/29/2019    Authorization - Visit Number  7    Authorization - Number of Visits  10    SLP Start Time  1500    SLP Stop Time   1550    SLP Time Calculation (min)  50 min    Activity Tolerance  Patient tolerated treatment well       Past Medical History:  Diagnosis Date  . Arthritis   . Asthma   . Cancer [facility])    prostate  . Chronic venous insufficiency    varicose vein lower extremity with inflammation  . Coronary artery disease 1996   two stents placed   . Diabetes mellitus without complication ([place])    type 2 on metformin  . GERD (gastroesophageal reflux disease)    no issues since gastric bypass surgery as stated per pt  . Hyperlipidemia   . Hypogonadism in male   . MRSA (methicillin resistant Staphylococcus aureus) infection    07/30/2008 thru 08/07/2008  . Sleep apnea    on BIPAP  . Stented coronary artery   . Stroke ([place])   . Thrombocythemia [facility])     Past Surgical History:  Procedure Laterality Date  . ANGIOPLASTY    . ANGIOPLASTY     with stent 04/07/1995  . ANTERIOR CERVICAL DECOMPRESSION/DISCECTOMY FUSION 4 LEVELS N/A 08/17/2017   Procedure: Anterior discectomy with fusion and plate fixation Cervical Three-Four, Four-Five, Five-Six, and Six-Seven Fusion;  Surgeon: Ditty, Kevan Ny, MD;  Location: [place];  Service: Neurosurgery;  Laterality: N/A;  Anterior discectomy with fusion and plate fixation Cervical Three-Four, Four-Five, Five-Six, and  Six-Seven Fusion   . APPENDECTOMY     1966  . BUBBLE STUDY  09/20/2018   Procedure: BUBBLE STUDY;  Surgeon: Josue Hector, MD;  Location: [place];  Service: Cardiovascular;;  . [place]  . CARDIOVASCULAR STRESS TEST     07/31/2011  . CARPAL TUNNEL RELEASE Left   . CHOLECYSTECTOMY     2006  . colonscopy      08/25/2012  . EYE SURGERY Bilateral    cataract  . FUNCTIONAL ENDOSCOPIC SINUS SURGERY     11/10/2013  . GASTRIC BYPASS     10/05/2012  . HERNIA REPAIR     left inguinal 1981  . INCISION AND DRAINAGE ABSCESS Right 02/26/2017   Procedure: INCISION AND DRAINAGE ABSCESS;  Surgeon: Nickie Retort, MD;  Location: [facility];  Service: Urology;  Laterality: Right;  . JOINT REPLACEMENT     bilateral  . left ankle surgery      05/03/2003   . left carpel tunnel      09/18/1993  . left knee meniscal tear      01/25/2010  . left knee meniscal tear repair      05/04/1996  . left rotator cuff repair      05/03/2003   . LOOP RECORDER INSERTION N/A 09/20/2018   Procedure: LOOP RECORDER INSERTION;  Surgeon: Evans Lance, MD;  Location: [place] CV LAB;  Service: Cardiovascular;  Laterality: N/A;  . REPLACEMENT TOTAL KNEE BILATERAL  07/13/2015  . right ankle surgery      fracture has 2 screws 07/07/1997  . right carpel tunnel      05/16/1992  . right shoulder replacement      01/27/2006  . SCROTAL EXPLORATION Right 02/26/2017   Procedure: SCROTUM EXPLORATION;  Surgeon: Nickie Retort, MD;  Location: [facility];  Service: Urology;  Laterality: Right;  . TEE WITHOUT CARDIOVERSION N/A 09/20/2018   Procedure: TRANSESOPHAGEAL ECHOCARDIOGRAM (TEE);  Surgeon: Josue Hector, MD;  Location: [facility] ENDOSCOPY;  Service: Cardiovascular;  Laterality: N/A;  . TOTAL KNEE ARTHROPLASTY Left 07/13/2015   Procedure: LEFT TOTAL KNEE ARTHROPLASTY;  Surgeon: Gaynelle Arabian, MD;  Location: [facility];  Service: Orthopedics;  Laterality: Left;    There were no vitals filed for this  visit.  Subjective Assessment - 04/21/19 0816    Subjective  The patient was alert, cooperative, and pleasant throughout the therapy session. He shared that he has an appointment to get his beard dyed white tomorrow in preparation for playing [name] this holiday season.            ADULT SLP TREATMENT - 04/21/19 0001      General Information   Behavior/Cognition  Alert;Cooperative;Pleasant mood;Hard of hearing    HPI  Pt is a 68 y.o. male with history of thrombocythemia, stroke, stent in coronary artery, hyperlidemia, diabetes, CAD. S/P (01/17/2019) ICH (intracerebral hemorrhage) (HCC) L tempoparietal infarct w. petechial hemorrhage and new L parietal hemorrhage into old L MCA infarct      Treatment Provided   Treatment provided  Cognitive-Linquistic      Pain Assessment   Pain Assessment  No/denies pain      Cognitive-Linquistic Treatment   Treatment focused on  Aphasia    Skilled Treatment  During a high level word finding task, the patient achieved 89% accuracy without interventions. Given semantic/phonemic cueing, accuracy increased to 96%. The patient completed a complex auditory attention/vigilance/memory task with 70% accuracy independently. Given repetitions and minimal cueing, accuracy improved to 90%. Patient completed a simple, concrete linguistic task by generating cogent, grammatically correct sentences with 75% accuracy without skilled interventions. Given minimal-moderate cueing to expand information content, accuracy increased to 85%. Patient demonstrated reading comprehension for abstract written information with 70% accuracy independently. Given minimal cueing, accuracy improved to 80%.      Assessment / Recommendations / Plan   Plan  Continue with current plan of care      Progression Toward Goals   Progression toward goals  Progressing toward goals       SLP Education - 04/21/19 0816    Education Details  Finish the sentence you started instead of waiting  on your listener to complete your thoughts.    Person(s) Educated  Patient    Methods  Explanation    Comprehension  Verbalized understanding         SLP Long Term Goals - 03/30/19 1411      SLP LONG TERM GOAL #1   Title  Patient will name common objects, without extraneous/irrelevant circumlocution, with 80% accuracy.    Time  4    Period  Weeks    Status  New    Target Date  04/26/19      SLP LONG TERM GOAL #2   Title  Patient will generate grammatical and cogent sentence to complete simple/concrete linguistic task  with 80% accuracy.    Time  8    Period  Weeks    Status  New    Target Date  05/24/19      SLP LONG TERM GOAL #3   Title  Patient will complete high level word finding tasks with 80% accuracy.    Time  12    Period  Weeks    Status  New    Target Date  06/21/19      SLP LONG TERM GOAL #4   Title  Patient will complete complex auditory attention/vigilance/memory tasks with 80% accuracy.    Time  12      SLP LONG TERM GOAL #5   Target Date  06/21/19      SLP LONG TERM GOAL #6   Title  Patient will demonstrate reading comprehension for complex/abstract written information with 80% accuracy.    Time  12    Period  Weeks    Status  New    Target Date  06/21/19       Plan - 04/21/19 0818    Clinical Impression Statement  The patient is able to complete simple verbal expression tasks with minimal disfluency. As task complexity increases, he exhibits increased disfluency and word finding difficulties. The patient frequently attempts to evade word finding errors using extraneous circumlocution/stopping and changing tactics. Patient would benefit from increased verbal fluency and reduced reliance on evasion tactics and listener inference of his meaning.    Speech Therapy Frequency  2x / week    Duration  Other (comment)    Treatment/Interventions  Language facilitation;SLP instruction and feedback;Patient/family education;Compensatory strategies    Potential  to Achieve Goals  Good    Potential Considerations  Ability to learn/carryover information;Previous level of function;Co-morbidities;Severity of impairments;Cooperation/participation level;Family/community support    SLP Home Exercise Plan  Reviewed    Consulted and Agree with Plan of Care  Patient       Patient will benefit from skilled therapeutic intervention in order to improve the following deficits and impairments:   Aphasia    Problem List Patient Active Problem List   Diagnosis Date Noted  . Syncope 02/10/2019  . Abrasion of knee, bilateral 02/08/2019  . Bradycardia with 31-40 beats per minute 02/08/2019  . Diabetes mellitus without complication ([place])   . Chronic venous insufficiency   . Cancer ([place])   . Asthma   . Arthritis   . GERD (gastroesophageal reflux disease)   . Hypogonadism in male   . MRSA (methicillin resistant Staphylococcus aureus) infection   . Sleep apnea   . Stented coronary artery   . Stroke ([place])   . Thrombocythemia ([place])   . 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 Acute ischemic left MCA stroke ([place]) 09/18/2018  . Coronary artery disease involving native coronary artery of native heart without angina pectoris   . H/O right coronary artery stent placement   . Allergic rhinitis 08/19/2018  . Cervical radiculopathy 08/17/2017  . Advanced care planning/counseling discussion 07/21/2017  . Skin lesion of right arm 12/16/2016  . Prostate cancer ([place]) 12/08/2016  . H/O bariatric surgery 07/17/2016  . Essential hypertension 04/24/2015  . Uncontrolled type 2 diabetes mellitus with hyperglycemia, without long-term current use of insulin ([place]) 04/24/2015  . Chronic sinusitis 08/13/2009  . Hyperlipidemia 04/02/2009  . Coronary atherosclerosis 04/02/2009  . 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 OSA (obstructive sleep apnea) 04/02/2009  .  Arthritis of shoulder region, degenerative 04/02/2009  . Coronary artery disease 1996   Frimy Uffelman A. Francis Dowse., Graduate Clinician Vella Kohler 04/21/2019, 8:19 AM  [place] MAIN [facility] SERVICES [address] [place], [name], [address] Phone: [phone]   Fax:  [phone]   Name: Andrew Cobb MRN: [id number] Date of Birth: March 21, 1951

## 2019-04-25 ENCOUNTER — Ambulatory Visit: Payer: PPO | Admitting: Speech Pathology

## 2019-04-25 ENCOUNTER — Other Ambulatory Visit: Payer: Self-pay

## 2019-04-25 ENCOUNTER — Encounter: Payer: Self-pay | Admitting: Speech Pathology

## 2019-04-25 DIAGNOSIS — R4701 Aphasia: Secondary | ICD-10-CM | POA: Diagnosis not present

## 2019-04-25 NOTE — Therapy (Signed)
[place] MAIN [facility] SERVICES [address] [place], [name], [address] Phone: [phone]   Fax:  [phone]  Speech Language Pathology Treatment  Patient Details  Name: Andrew Cobb MRN: [id number] Date of Birth: 1950-10-10 Referring Provider (SLP): Sethi,Promod   Encounter Date: 04/25/2019  End of Session - 04/25/19 1655    Visit Number  8    Number of Visits  25    Date for SLP Re-Evaluation  06/21/19    Authorization Type  Medicare    Authorization Time Period  03/29/2019    Authorization - Visit Number  8    Authorization - Number of Visits  10    SLP Start Time  1500    SLP Stop Time   1550    SLP Time Calculation (min)  50 min    Activity Tolerance  Patient tolerated treatment well       Past Medical History:  Diagnosis Date  . Arthritis   . Asthma   . Cancer [facility])    prostate  . Chronic venous insufficiency    varicose vein lower extremity with inflammation  . Coronary artery disease 1996   two stents placed   . Diabetes mellitus without complication ([place])    type 2 on metformin  . GERD (gastroesophageal reflux disease)    no issues since gastric bypass surgery as stated per pt  . Hyperlipidemia   . Hypogonadism in male   . MRSA (methicillin resistant Staphylococcus aureus) infection    07/30/2008 thru 08/07/2008  . Sleep apnea    on BIPAP  . Stented coronary artery   . Stroke ([place])   . Thrombocythemia [facility])     Past Surgical History:  Procedure Laterality Date  . ANGIOPLASTY    . ANGIOPLASTY     with stent 04/07/1995  . ANTERIOR CERVICAL DECOMPRESSION/DISCECTOMY FUSION 4 LEVELS N/A 08/17/2017   Procedure: Anterior discectomy with fusion and plate fixation Cervical Three-Four, Four-Five, Five-Six, and Six-Seven Fusion;  Surgeon: Ditty, Kevan Ny, MD;  Location: [place];  Service: Neurosurgery;  Laterality: N/A;  Anterior discectomy with fusion and plate fixation Cervical Three-Four, Four-Five, Five-Six, and  Six-Seven Fusion   . APPENDECTOMY     1966  . BUBBLE STUDY  09/20/2018   Procedure: BUBBLE STUDY;  Surgeon: Josue Hector, MD;  Location: [place];  Service: Cardiovascular;;  . [place]  . CARDIOVASCULAR STRESS TEST     07/31/2011  . CARPAL TUNNEL RELEASE Left   . CHOLECYSTECTOMY     2006  . colonscopy      08/25/2012  . EYE SURGERY Bilateral    cataract  . FUNCTIONAL ENDOSCOPIC SINUS SURGERY     11/10/2013  . GASTRIC BYPASS     10/05/2012  . HERNIA REPAIR     left inguinal 1981  . INCISION AND DRAINAGE ABSCESS Right 02/26/2017   Procedure: INCISION AND DRAINAGE ABSCESS;  Surgeon: Nickie Retort, MD;  Location: [facility];  Service: Urology;  Laterality: Right;  . JOINT REPLACEMENT     bilateral  . left ankle surgery      05/03/2003   . left carpel tunnel      09/18/1993  . left knee meniscal tear      01/25/2010  . left knee meniscal tear repair      05/04/1996  . left rotator cuff repair      05/03/2003   . LOOP RECORDER INSERTION N/A 09/20/2018   Procedure: LOOP RECORDER INSERTION;  Surgeon: Evans Lance, MD;  Location: [place] CV LAB;  Service: Cardiovascular;  Laterality: N/A;  . REPLACEMENT TOTAL KNEE BILATERAL  07/13/2015  . right ankle surgery      fracture has 2 screws 07/07/1997  . right carpel tunnel      05/16/1992  . right shoulder replacement      01/27/2006  . SCROTAL EXPLORATION Right 02/26/2017   Procedure: SCROTUM EXPLORATION;  Surgeon: Nickie Retort, MD;  Location: [facility];  Service: Urology;  Laterality: Right;  . TEE WITHOUT CARDIOVERSION N/A 09/20/2018   Procedure: TRANSESOPHAGEAL ECHOCARDIOGRAM (TEE);  Surgeon: Josue Hector, MD;  Location: [facility] ENDOSCOPY;  Service: Cardiovascular;  Laterality: N/A;  . TOTAL KNEE ARTHROPLASTY Left 07/13/2015   Procedure: LEFT TOTAL KNEE ARTHROPLASTY;  Surgeon: Gaynelle Arabian, MD;  [facility];  Service: Orthopedics;  Laterality: Left;    There were no vitals filed for this  visit.  Subjective Assessment - 04/25/19 1655    Subjective  The patient was alert, cooperative, and pleasant throughout the therapy session. When asked, he reported feeling confident about his speech abilities for the purposes of playing [name] this holiday season.            ADULT SLP TREATMENT - 04/25/19 0001      General Information   Behavior/Cognition  Alert;Cooperative;Pleasant mood;Hard of hearing    HPI  Pt is a 68 y.o. male with history of thrombocythemia, stroke, stent in coronary artery, hyperlidemia, diabetes, CAD. S/P (01/17/2019) ICH (intracerebral hemorrhage) (HCC) L tempoparietal infarct w. petechial hemorrhage and new L parietal hemorrhage into old L MCA infarct      Treatment Provided   Treatment provided  Cognitive-Linquistic      Pain Assessment   Pain Assessment  No/denies pain      Cognitive-Linquistic Treatment   Treatment focused on  Aphasia    Skilled Treatment  During a high level word finding task, the patient achieved 50% accuracy without interventions. Given semantic/phonemic cueing, accuracy increased to 75%. The patient completed a complex auditory attention/vigilance/memory task with 59% accuracy independently. Given repetition of information and minimal cueing, accuracy improved to 91%. During a simple, concrete linguistic task, the patient generated cogent, grammatically correct sentences with 60% accuracy without skilled interventions. Patient's responses during this task contained reduced information content, some consisting of only 1 word. Given moderate cueing to formulate complete sentences and expand information content, accuracy increased to 75%.       Assessment / Recommendations / Plan   Plan  Continue with current plan of care      Progression Toward Goals   Progression toward goals  Progressing toward goals       SLP Education - 04/25/19 1655    Education Details  Expand on that. Tell me more.    Person(s) Educated  Patient     Methods  Explanation;Demonstration    Comprehension  Verbalized understanding;Returned demonstration         SLP Long Term Goals - 03/30/19 1411      SLP LONG TERM GOAL #1   Title  Patient will name common objects, without extraneous/irrelevant circumlocution, with 80% accuracy.    Time  4    Period  Weeks    Status  New    Target Date  04/26/19      SLP LONG TERM GOAL #2   Title  Patient will generate grammatical and cogent sentence to complete simple/concrete linguistic task with 80% accuracy.    Time  8    Period  Weeks    Status  New    Target Date  05/24/19      SLP LONG TERM GOAL #3   Title  Patient will complete high level word finding tasks with 80% accuracy.    Time  12    Period  Weeks    Status  New    Target Date  06/21/19      SLP LONG TERM GOAL #4   Title  Patient will complete complex auditory attention/vigilance/memory tasks with 80% accuracy.    Time  12      SLP LONG TERM GOAL #5   Target Date  06/21/19      SLP LONG TERM GOAL #6   Title  Patient will demonstrate reading comprehension for complex/abstract written information with 80% accuracy.    Time  12    Period  Weeks    Status  New    Target Date  06/21/19       Plan - 04/25/19 1656    Clinical Impression Statement  The patient is able to complete simple verbal expression tasks with minimal disfluency. As task complexity increases, he exhibits increased disfluency and word finding difficulties. The patient attempts to evade word finding errors using extraneous circumlocution/stopping and changing tactics to allow himself more time or to avoid the target word entirely. He habitually relies on his listener to complete his thoughts when he experiences disfluency and requires cueing in the therapy setting to finish his sentences. Patient reports his wife also cues him to complete his sentences at home to facilitate generalization to the home environment.    Speech Therapy Frequency  2x / week     Duration  Other (comment)    Treatment/Interventions  Language facilitation;SLP instruction and feedback;Patient/family education;Compensatory strategies    Potential to Achieve Goals  Good    Potential Considerations  Ability to learn/carryover information;Previous level of function;Co-morbidities;Severity of impairments;Cooperation/participation level;Family/community support    SLP Home Exercise Plan  Reviewed    Consulted and Agree with Plan of Care  Patient       Patient will benefit from skilled therapeutic intervention in order to improve the following deficits and impairments:   Aphasia    Problem List Patient Active Problem List   Diagnosis Date Noted  . Syncope 02/10/2019  . Abrasion of knee, bilateral 02/08/2019  . Bradycardia with 31-40 beats per minute 02/08/2019  . Diabetes mellitus without complication ([place])   . Chronic venous insufficiency   . Cancer ([place])   . Asthma   . Arthritis   . GERD (gastroesophageal reflux disease)   . Hypogonadism in male   . MRSA (methicillin resistant Staphylococcus aureus) infection   . Sleep apnea   . Stented coronary artery   . Stroke ([place])   . Thrombocythemia ([place])   . 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 Acute ischemic left MCA stroke ([place]) 09/18/2018  . Coronary artery disease involving native coronary artery of native heart without angina pectoris   . H/O right coronary artery stent placement   . Allergic rhinitis 08/19/2018  . Cervical radiculopathy 08/17/2017  . Advanced care planning/counseling discussion 07/21/2017  . Skin lesion of right arm 12/16/2016  . Prostate cancer ([place]) 12/08/2016  . H/O bariatric surgery 07/17/2016  . Essential hypertension 04/24/2015  . Uncontrolled type 2 diabetes mellitus with hyperglycemia, without long-term current use of insulin (  [place]) 04/24/2015  . Chronic sinusitis 08/13/2009  .  Hyperlipidemia 04/02/2009  . Coronary atherosclerosis 04/02/2009  . 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 OSA (obstructive sleep apnea) 04/02/2009  . Arthritis of shoulder region, degenerative 04/02/2009  . Coronary artery disease 1996   Suzanne Kho A. Francis Dowse., Graduate Clinician Vella Kohler 04/25/2019, 4:56 PM  [place] MAIN [facility] SERVICES [address] [place], [name], [address] Phone: [phone]   Fax:  [phone]   Name: SAMBHAV CARMENATE MRN: [id number] Date of Birth: 1951-06-21

## 2019-04-27 ENCOUNTER — Ambulatory Visit: Payer: PPO | Admitting: Speech Pathology

## 2019-04-28 NOTE — Progress Notes (Signed)
Carelink Summary Report / Loop Recorder 

## 2019-05-02 ENCOUNTER — Other Ambulatory Visit: Payer: Self-pay

## 2019-05-02 ENCOUNTER — Ambulatory Visit: Payer: PPO | Admitting: Speech Pathology

## 2019-05-02 DIAGNOSIS — R4701 Aphasia: Secondary | ICD-10-CM | POA: Diagnosis not present

## 2019-05-03 ENCOUNTER — Encounter: Payer: Self-pay | Admitting: Speech Pathology

## 2019-05-03 DIAGNOSIS — H353132 Nonexudative age-related macular degeneration, bilateral, intermediate dry stage: Secondary | ICD-10-CM | POA: Diagnosis not present

## 2019-05-03 DIAGNOSIS — H35721 Serous detachment of retinal pigment epithelium, right eye: Secondary | ICD-10-CM | POA: Diagnosis not present

## 2019-05-03 DIAGNOSIS — H353211 Exudative age-related macular degeneration, right eye, with active choroidal neovascularization: Secondary | ICD-10-CM | POA: Diagnosis not present

## 2019-05-03 DIAGNOSIS — H35371 Puckering of macula, right eye: Secondary | ICD-10-CM | POA: Diagnosis not present

## 2019-05-03 NOTE — Therapy (Signed)
[place] MAIN [facility] SERVICES [address] [place], [name], [address] Phone: [phone]   Fax:  [phone]  Speech Language Pathology Treatment  Patient Details  Name: Andrew Cobb MRN: [id number] Date of Birth: 06-04-1951 Referring Provider (SLP): Sethi,Promod   Encounter Date: 05/02/2019  End of Session - 05/03/19 0826    Visit Number  9    Number of Visits  25    Date for SLP Re-Evaluation  06/21/19    Authorization Type  Medicare    Authorization Time Period  03/29/2019    Authorization - Visit Number  9    Authorization - Number of Visits  10    SLP Start Time  1500    SLP Stop Time   1550    SLP Time Calculation (min)  50 min    Activity Tolerance  Patient tolerated treatment well       Past Medical History:  Diagnosis Date  . Arthritis   . Asthma   . Cancer [facility])    prostate  . Chronic venous insufficiency    varicose vein lower extremity with inflammation  . Coronary artery disease 1996   two stents placed   . Diabetes mellitus without complication ([place])    type 2 on metformin  . GERD (gastroesophageal reflux disease)    no issues since gastric bypass surgery as stated per pt  . Hyperlipidemia   . Hypogonadism in male   . MRSA (methicillin resistant Staphylococcus aureus) infection    07/30/2008 thru 08/07/2008  . Sleep apnea    on BIPAP  . Stented coronary artery   . Stroke ([place])   . Thrombocythemia [facility])     Past Surgical History:  Procedure Laterality Date  . ANGIOPLASTY    . ANGIOPLASTY     with stent 04/07/1995  . ANTERIOR CERVICAL DECOMPRESSION/DISCECTOMY FUSION 4 LEVELS N/A 08/17/2017   Procedure: Anterior discectomy with fusion and plate fixation Cervical Three-Four, Four-Five, Five-Six, and Six-Seven Fusion;  Surgeon: Ditty, Kevan Ny, MD;  Location: [place];  Service: Neurosurgery;  Laterality: N/A;  Anterior discectomy with fusion and plate fixation Cervical Three-Four, Four-Five, Five-Six, and  Six-Seven Fusion   . APPENDECTOMY     1966  . BUBBLE STUDY  09/20/2018   Procedure: BUBBLE STUDY;  Surgeon: Josue Hector, MD;  Location: [place];  Service: Cardiovascular;;  . [place]  . CARDIOVASCULAR STRESS TEST     07/31/2011  . CARPAL TUNNEL RELEASE Left   . CHOLECYSTECTOMY     2006  . colonscopy      08/25/2012  . EYE SURGERY Bilateral    cataract  . FUNCTIONAL ENDOSCOPIC SINUS SURGERY     11/10/2013  . GASTRIC BYPASS     10/05/2012  . HERNIA REPAIR     left inguinal 1981  . INCISION AND DRAINAGE ABSCESS Right 02/26/2017   Procedure: INCISION AND DRAINAGE ABSCESS;  Surgeon: Nickie Retort, MD;  Location: [facility];  Service: Urology;  Laterality: Right;  . JOINT REPLACEMENT     bilateral  . left ankle surgery      05/03/2003   . left carpel tunnel      09/18/1993  . left knee meniscal tear      01/25/2010  . left knee meniscal tear repair      05/04/1996  . left rotator cuff repair      05/03/2003   . LOOP RECORDER INSERTION N/A 09/20/2018   Procedure: LOOP RECORDER INSERTION;  Surgeon: Evans Lance, MD;  Location: [place] CV LAB;  Service: Cardiovascular;  Laterality: N/A;  . REPLACEMENT TOTAL KNEE BILATERAL  07/13/2015  . right ankle surgery      fracture has 2 screws 07/07/1997  . right carpel tunnel      05/16/1992  . right shoulder replacement      01/27/2006  . SCROTAL EXPLORATION Right 02/26/2017   Procedure: SCROTUM EXPLORATION;  Surgeon: Nickie Retort, MD;  Location: [facility];  Service: Urology;  Laterality: Right;  . TEE WITHOUT CARDIOVERSION N/A 09/20/2018   Procedure: TRANSESOPHAGEAL ECHOCARDIOGRAM (TEE);  Surgeon: Josue Hector, MD;  Location: [facility] ENDOSCOPY;  Service: Cardiovascular;  Laterality: N/A;  . TOTAL KNEE ARTHROPLASTY Left 07/13/2015   Procedure: LEFT TOTAL KNEE ARTHROPLASTY;  Surgeon: Gaynelle Arabian, MD;  Location: [facility];  Service: Orthopedics;  Laterality: Left;    There were no vitals filed for this  visit.  Subjective Assessment - 05/03/19 0826    Subjective  The patient was alert, cooperative, and pleasant throughout the therapy session. He proudly shared that his grandson is making straight A's during his first semester in high school.            ADULT SLP TREATMENT - 05/03/19 0001      General Information   Behavior/Cognition  Alert;Cooperative;Pleasant mood;Hard of hearing    HPI  Pt is a 68 y.o. male with history of thrombocythemia, stroke, stent in coronary artery, hyperlidemia, diabetes, CAD. S/P (01/17/2019) ICH (intracerebral hemorrhage) (HCC) L tempoparietal infarct w. petechial hemorrhage and new L parietal hemorrhage into old L MCA infarct      Treatment Provided   Treatment provided  Cognitive-Linquistic      Pain Assessment   Pain Assessment  No/denies pain      Cognitive-Linquistic Treatment   Treatment focused on  Aphasia    Skilled Treatment  During a high level word finding task, the patient achieved 70% accuracy independently. Given moderate semantic/phonemic cueing, accuracy increased to 85%. During a simple, concrete linguistic task, the patient generated cogent, grammatically correct sentences with 60% accuracy without skilled interventions. Given moderate cueing to formulate complete sentences and expand information content, accuracy increased to 80%. 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 Verbal education and demonstration provided of easy onset fluency technique. Patient verbalized understanding and completed return demonstration of the technique.      Assessment / Recommendations / Plan   Plan  Continue with current plan of care      Progression Toward Goals   Progression toward goals  Progressing toward goals       SLP Education - 05/03/19 0826    Education Details  Explanation and demonstration of easy onset fluency technique.    Person(s) Educated   Patient    Methods  Explanation;Demonstration    Comprehension  Verbalized understanding;Returned demonstration         SLP Long Term Goals - 03/30/19 1411      SLP LONG TERM GOAL #1   Title  Patient will name common objects, without extraneous/irrelevant circumlocution, with 80% accuracy.    Time  4    Period  Weeks    Status  New    Target Date  04/26/19      SLP LONG TERM GOAL #2   Title  Patient will generate grammatical and cogent sentence to complete simple/concrete linguistic task with 80% accuracy.    Time  8    Period  Weeks    Status  New    Target Date  05/24/19      SLP LONG TERM GOAL #3   Title  Patient will complete high level word finding tasks with 80% accuracy.    Time  12    Period  Weeks    Status  New    Target Date  06/21/19      SLP LONG TERM GOAL #4   Title  Patient will complete complex auditory attention/vigilance/memory tasks with 80% accuracy.    Time  12      SLP LONG TERM GOAL #5   Target Date  06/21/19      SLP LONG TERM GOAL #6   Title  Patient will demonstrate reading comprehension for complex/abstract written information with 80% accuracy.    Time  12    Period  Weeks    Status  New    Target Date  06/21/19       Plan - 05/03/19 0827    Clinical Impression Statement  The patient is able to complete simple verbal expression tasks with minimal disfluency. As task complexity increases, he exhibits increased disfluency and word finding difficulties. The patient attempts to evade word finding errors using extraneous circumlocution/stopping and changing tactics to allow himself more time or to avoid the target word entirely. He habitually relies on his listener to complete his thoughts when he experiences disfluency, though he is making guarded progress within the therapy setting with requiring less cueing to finish his own sentences. Patient reports his wife also cues him to complete his sentences at home to facilitate generalization of  progress to the home environment.    Speech Therapy Frequency  2x / week    Duration  Other (comment)    Treatment/Interventions  Language facilitation;SLP instruction and feedback;Patient/family education;Compensatory strategies    Potential to Achieve Goals  Good    Potential Considerations  Ability to learn/carryover information;Previous level of function;Co-morbidities;Severity of impairments;Cooperation/participation level;Family/community support    SLP Home Exercise Plan  Reviewed    Consulted and Agree with Plan of Care  Patient       Patient will benefit from skilled therapeutic intervention in order to improve the following deficits and impairments:   Aphasia    Problem List Patient Active Problem List   Diagnosis Date Noted  . Syncope 02/10/2019  . Abrasion of knee, bilateral 02/08/2019  . Bradycardia with 31-40 beats per minute 02/08/2019  . Diabetes mellitus without complication ([place])   . Chronic venous insufficiency   . Cancer ([place])   . Asthma   . Arthritis   . GERD (gastroesophageal reflux disease)   . Hypogonadism in male   . MRSA (methicillin resistant Staphylococcus aureus) infection   . Sleep apnea   . Stented coronary artery   . Stroke ([place])   . Thrombocythemia ([place])   . 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 Acute ischemic left MCA stroke ([place]) 09/18/2018  . Coronary artery disease involving native coronary artery of native heart without angina pectoris   . H/O right coronary artery stent placement   . Allergic rhinitis 08/19/2018  . Cervical radiculopathy 08/17/2017  . Advanced care planning/counseling discussion 07/21/2017  . Skin lesion of right arm 12/16/2016  . Prostate cancer ([place]) 12/08/2016  . H/O bariatric surgery 07/17/2016  . Essential hypertension 04/24/2015  . Uncontrolled type 2  diabetes mellitus with hyperglycemia, without long-term  current use of insulin ([place]) 04/24/2015  . Chronic sinusitis 08/13/2009  . Hyperlipidemia 04/02/2009  . Coronary atherosclerosis 04/02/2009  . 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 OSA (obstructive sleep apnea) 04/02/2009  . Arthritis of shoulder region, degenerative 04/02/2009  . Coronary artery disease 1996   Infinity Jeffords A. Francis Dowse., Graduate Clinician Vella Kohler 05/03/2019, 8:28 AM  [place] MAIN [facility] SERVICES [address] [place], [name], [address] Phone: [phone]   Fax:  [phone]   Name: Andrew Cobb MRN: [id number] Date of Birth: Oct 21, 1950

## 2019-05-04 ENCOUNTER — Ambulatory Visit: Payer: PPO | Admitting: Speech Pathology

## 2019-05-04 ENCOUNTER — Other Ambulatory Visit: Payer: Self-pay

## 2019-05-04 DIAGNOSIS — R4701 Aphasia: Secondary | ICD-10-CM

## 2019-05-05 ENCOUNTER — Encounter: Payer: Self-pay | Admitting: Speech Pathology

## 2019-05-05 NOTE — Therapy (Signed)
[place] MAIN [facility] SERVICES [address] [place], [name], [address] Phone: [phone]   Fax:  [phone]  Speech Language Pathology Treatment/Progress Note   Speech Therapy Progress Note   Dates of reporting period  03/29/2019   to   05/04/2019   Patient Details  Name: Andrew Cobb MRN: [id number] Date of Birth: [birth year] Referring Provider (SLP): Sethi,Promod   Encounter Date: 05/04/2019  End of Session - 05/05/19 0848    Visit Number  10    Number of Visits  25    Date for SLP Re-Evaluation  06/21/19    Authorization Type  Medicare    Authorization Time Period  03/29/2019    Authorization - Visit Number  10    Authorization - Number of Visits  10    SLP Start Time  1500    SLP Stop Time   1550    SLP Time Calculation (min)  50 min    Activity Tolerance  Patient tolerated treatment well       Past Medical History:  Diagnosis Date  . Arthritis   . Asthma   . Cancer [facility])    prostate  . Chronic venous insufficiency    varicose vein lower extremity with inflammation  . Coronary artery disease 1996   two stents placed   . Diabetes mellitus without complication ([place])    type 2 on metformin  . GERD (gastroesophageal reflux disease)    no issues since gastric bypass surgery as stated per pt  . Hyperlipidemia   . Hypogonadism in male   . MRSA (methicillin resistant Staphylococcus aureus) infection    07/30/2008 thru 08/07/2008  . Sleep apnea    on BIPAP  . Stented coronary artery   . Stroke ([place])   . Thrombocythemia [facility])     Past Surgical History:  Procedure Laterality Date  . ANGIOPLASTY    . ANGIOPLASTY     with stent 04/07/1995  . ANTERIOR CERVICAL DECOMPRESSION/DISCECTOMY FUSION 4 LEVELS N/A 08/17/2017   Procedure: Anterior discectomy with fusion and plate fixation Cervical Three-Four, Four-Five, Five-Six, and Six-Seven Fusion;  Surgeon: Ditty, Kevan Ny, MD;  Location: [place];  Service: Neurosurgery;   Laterality: N/A;  Anterior discectomy with fusion and plate fixation Cervical Three-Four, Four-Five, Five-Six, and Six-Seven Fusion   . APPENDECTOMY     1966  . BUBBLE STUDY  09/20/2018   Procedure: BUBBLE STUDY;  Surgeon: Josue Hector, MD;  Location: [place];  Service: Cardiovascular;;  . [place]  . CARDIOVASCULAR STRESS TEST     07/31/2011  . CARPAL TUNNEL RELEASE Left   . CHOLECYSTECTOMY     2006  . colonscopy      08/25/2012  . EYE SURGERY Bilateral    cataract  . FUNCTIONAL ENDOSCOPIC SINUS SURGERY     11/10/2013  . GASTRIC BYPASS     10/05/2012  . HERNIA REPAIR     left inguinal 1981  . INCISION AND DRAINAGE ABSCESS Right 02/26/2017   Procedure: INCISION AND DRAINAGE ABSCESS;  Surgeon: Nickie Retort, MD;  Location: [facility];  Service: Urology;  Laterality: Right;  . JOINT REPLACEMENT     bilateral  . left ankle surgery      05/03/2003   . left carpel tunnel      09/18/1993  . left knee meniscal tear      01/25/2010  . left knee meniscal tear repair      05/04/1996  . left rotator  cuff repair      05/03/2003   . LOOP RECORDER INSERTION N/A 09/20/2018   Procedure: LOOP RECORDER INSERTION;  Surgeon: Evans Lance, MD;  Location: [place] CV LAB;  Service: Cardiovascular;  Laterality: N/A;  . REPLACEMENT TOTAL KNEE BILATERAL  07/13/2015  . right ankle surgery      fracture has 2 screws 07/07/1997  . right carpel tunnel      05/16/1992  . right shoulder replacement      01/27/2006  . SCROTAL EXPLORATION Right 02/26/2017   Procedure: SCROTUM EXPLORATION;  Surgeon: Nickie Retort, MD;  Location: [facility];  Service: Urology;  Laterality: Right;  . TEE WITHOUT CARDIOVERSION N/A 09/20/2018   Procedure: TRANSESOPHAGEAL ECHOCARDIOGRAM (TEE);  Surgeon: Josue Hector, MD;  Location: [facility] ENDOSCOPY;  Service: Cardiovascular;  Laterality: N/A;  . TOTAL KNEE ARTHROPLASTY Left 07/13/2015   Procedure: LEFT TOTAL KNEE ARTHROPLASTY;  Surgeon: Gaynelle Arabian, MD;  [facility];  Service: Orthopedics;  Laterality: Left;    There were no vitals filed for this visit.  Subjective Assessment - 05/05/19 0846    Subjective  The patient was alert, cooperative, and pleasant throughout the therapy session. 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              SLP Education - 05/05/19 [phone]    Education Details  When you experience stuttering, try to ease out of it and start again with an easy onset.    Person(s) Educated  Patient    Methods  Explanation;Demonstration    Comprehension  Verbalized understanding;Returned demonstration         SLP Long Term Goals - 05/06/19 1024      SLP LONG TERM GOAL #1   Title  Patient will name common objects, without extraneous/irrelevant circumlocution, with 80% accuracy.    Status  Achieved      SLP LONG TERM GOAL #2   Title  Patient will generate grammatical and cogent sentence to complete simple/concrete linguistic task with 80% accuracy.    Status  Partially Met    Target Date  05/24/19      SLP LONG TERM GOAL #3   Title  Patient will complete high level word finding tasks with 80% accuracy.    Status  Partially Met    Target Date  06/21/19      SLP LONG TERM GOAL #4   Status  Partially Met       Plan - 05/05/19 0848    Clinical Impression Statement  The patient is able to complete simple verbal expression tasks with minimal disfluency. As task complexity increases, he exhibits increased disfluency and word finding difficulties. The patient habitually attempts to evade word finding errors using extraneous circumlocution/stopping and changing tactics to allow himself more time or to avoid the target word entirely and relies on his listener to complete his thoughts when he experiences disfluency. He demonstrates progress within the therapy context with finishing his own sentences independently and reducing extraneous  circumlocution. Patient would benefit from increased generalization of compensatory strategy use to his less structured communication outside of the therapy setting.    Speech Therapy Frequency  2x / week    Duration  Other (comment)    Treatment/Interventions  Language facilitation;SLP instruction and feedback;Patient/family education;Compensatory strategies    Potential to Achieve Goals  Good    Potential Considerations  Ability to learn/carryover information;Previous level of function;Co-morbidities;Severity of impairments;Cooperation/participation  level;Family/community support    SLP Home Exercise Plan  Reviewed    Consulted and Agree with Plan of Care  Patient       Patient will benefit from skilled therapeutic intervention in order to improve the following deficits and impairments:   Aphasia    Problem List Patient Active Problem List   Diagnosis Date Noted  . Syncope 02/10/2019  . Abrasion of knee, bilateral 02/08/2019  . Bradycardia with 31-40 beats per minute 02/08/2019  . Diabetes mellitus without complication ([place])   . Chronic venous insufficiency   . Cancer ([place])   . Asthma   . Arthritis   . GERD (gastroesophageal reflux disease)   . Hypogonadism in male   . MRSA (methicillin resistant Staphylococcus aureus) infection   . Sleep apnea   . Stented coronary artery   . Stroke ([place])   . Thrombocythemia ([place])   . 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 Acute ischemic left MCA stroke ([place]) 09/18/2018  . Coronary artery disease involving native coronary artery of native heart without angina pectoris   . H/O right coronary artery stent placement   . Allergic rhinitis 08/19/2018  . Cervical radiculopathy 08/17/2017  . Advanced care planning/counseling discussion 07/21/2017  . Skin lesion of right arm 12/16/2016  . Prostate cancer ([place]) 12/08/2016  . H/O bariatric surgery  07/17/2016  . Essential hypertension 04/24/2015  . Uncontrolled type 2 diabetes mellitus with hyperglycemia, without long-term current use of insulin ([place]) 04/24/2015  . Chronic sinusitis 08/13/2009  . Hyperlipidemia 04/02/2009  . Coronary atherosclerosis 04/02/2009  . 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 OSA (obstructive sleep apnea) 04/02/2009  . Arthritis of shoulder region, degenerative 04/02/2009  . Coronary artery disease 1996   [place] A. Francis Dowse., Graduate Clinician Lou Miner 05/06/2019, 10:26 AM  [place] MAIN [facility] SERVICES [address] [place], [name], [address] Phone: [phone]   Fax:  [phone]   Name: ARLAND USERY MRN: [id number] Date of Birth: November 02, 1950

## 2019-05-09 ENCOUNTER — Other Ambulatory Visit: Payer: Self-pay

## 2019-05-09 ENCOUNTER — Ambulatory Visit: Payer: PPO | Attending: Neurology | Admitting: Speech Pathology

## 2019-05-09 DIAGNOSIS — R4701 Aphasia: Secondary | ICD-10-CM | POA: Diagnosis not present

## 2019-05-10 ENCOUNTER — Other Ambulatory Visit: Payer: Self-pay

## 2019-05-10 ENCOUNTER — Encounter: Payer: Self-pay | Admitting: Speech Pathology

## 2019-05-10 ENCOUNTER — Ambulatory Visit: Payer: PPO | Admitting: Speech Pathology

## 2019-05-10 DIAGNOSIS — R4701 Aphasia: Secondary | ICD-10-CM

## 2019-05-10 NOTE — Therapy (Signed)
[place] MAIN [facility] SERVICES [address] [place], [name], [address] Phone: [phone]   Fax:  [phone]  Speech Language Pathology Treatment  Patient Details  Name: Andrew Cobb MRN: [id number] Date of Birth: [birth year] Referring Provider (SLP): Sethi,Promod   Encounter Date: 05/09/2019  End of Session - 05/10/19 0905    Visit Number  11    Number of Visits  25    Date for SLP Re-Evaluation  06/21/19    Authorization Type  Medicare    Authorization Time Period  05/09/2019    Authorization - Visit Number  1    Authorization - Number of Visits  10    SLP Start Time  1500    SLP Stop Time   1550    SLP Time Calculation (min)  50 min    Activity Tolerance  Patient tolerated treatment well       Past Medical History:  Diagnosis Date  . Arthritis   . Asthma   . Cancer [facility])    prostate  . Chronic venous insufficiency    varicose vein lower extremity with inflammation  . Coronary artery disease 1996   two stents placed   . Diabetes mellitus without complication ([place])    type 2 on metformin  . GERD (gastroesophageal reflux disease)    no issues since gastric bypass surgery as stated per pt  . Hyperlipidemia   . Hypogonadism in male   . MRSA (methicillin resistant Staphylococcus aureus) infection    07/30/2008 thru 08/07/2008  . Sleep apnea    on BIPAP  . Stented coronary artery   . Stroke ([place])   . Thrombocythemia [facility])     Past Surgical History:  Procedure Laterality Date  . ANGIOPLASTY    . ANGIOPLASTY     with stent 04/07/1995  . ANTERIOR CERVICAL DECOMPRESSION/DISCECTOMY FUSION 4 LEVELS N/A 08/17/2017   Procedure: Anterior discectomy with fusion and plate fixation Cervical Three-Four, Four-Five, Five-Six, and Six-Seven Fusion;  Surgeon: Ditty, Kevan Ny, MD;  Location: [place];  Service: Neurosurgery;  Laterality: N/A;  Anterior discectomy with fusion and plate fixation Cervical Three-Four, Four-Five, Five-Six, and  Six-Seven Fusion   . APPENDECTOMY     1966  . BUBBLE STUDY  09/20/2018   Procedure: BUBBLE STUDY;  Surgeon: Josue Hector, MD;  Location: [place];  Service: Cardiovascular;;  . [place]  . CARDIOVASCULAR STRESS TEST     07/31/2011  . CARPAL TUNNEL RELEASE Left   . CHOLECYSTECTOMY     2006  . colonscopy      08/25/2012  . EYE SURGERY Bilateral    cataract  . FUNCTIONAL ENDOSCOPIC SINUS SURGERY     11/10/2013  . GASTRIC BYPASS     10/05/2012  . HERNIA REPAIR     left inguinal 1981  . INCISION AND DRAINAGE ABSCESS Right 02/26/2017   Procedure: INCISION AND DRAINAGE ABSCESS;  Surgeon: Nickie Retort, MD;  Location: [facility];  Service: Urology;  Laterality: Right;  . JOINT REPLACEMENT     bilateral  . left ankle surgery      05/03/2003   . left carpel tunnel      09/18/1993  . left knee meniscal tear      01/25/2010  . left knee meniscal tear repair      05/04/1996  . left rotator cuff repair      05/03/2003   . LOOP RECORDER INSERTION N/A 09/20/2018   Procedure: LOOP RECORDER INSERTION;  Surgeon: Evans Lance, MD;  Location: [place] CV LAB;  Service: Cardiovascular;  Laterality: N/A;  . REPLACEMENT TOTAL KNEE BILATERAL  07/13/2015  . right ankle surgery      fracture has 2 screws 07/07/1997  . right carpel tunnel      05/16/1992  . right shoulder replacement      01/27/2006  . SCROTAL EXPLORATION Right 02/26/2017   Procedure: SCROTUM EXPLORATION;  Surgeon: Nickie Retort, MD;  Location: [facility];  Service: Urology;  Laterality: Right;  . TEE WITHOUT CARDIOVERSION N/A 09/20/2018   Procedure: TRANSESOPHAGEAL ECHOCARDIOGRAM (TEE);  Surgeon: Josue Hector, MD;  Location: [facility] END[facility]COPY;  Service: Cardiovascular;  Laterality: N/A;  . TOTAL KNEE ARTHROPLASTY Left 07/13/2015   Procedure: LEFT TOTAL KNEE ARTHROPLASTY;  Surgeon: Gaynelle Arabian, MD;  [facility];  Service: Orthopedics;  Laterality: Left;    There were no vitals filed for this  visit.  Subjective Assessment - 05/10/19 0902    Subjective  The patient was alert, cooperative, and pleasant throughout the therapy session. He shared stories about his family.            ADULT SLP TREATMENT - 05/10/19 0001      General Information   Behavior/Cognition  Alert;Cooperative;Pleasant mood;Hard of hearing    HPI  Pt is a 68 y.o. male with history of thrombocythemia, stroke, stent in coronary artery, hyperlidemia, diabetes, CAD. S/P (01/17/2019) ICH (intracerebral hemorrhage) (HCC) L tempoparietal infarct w. petechial hemorrhage and new L parietal hemorrhage into old L MCA infarct      Treatment Provided   Treatment provided  Cognitive-Linquistic      Pain Assessment   Pain Assessment  No/denies pain      Cognitive-Linquistic Treatment   Treatment focused on  Aphasia    Skilled Treatment  Patient named common objects, without extraneous/irrelevant circumlocution with 83% accuracy without interventions. Given semantic cueing, accuracy improved to 89%. During a high level word finding task, the patient achieved 75% accuracy independently. Given moderate verbal and visual cueing, accuracy increased to 92%. During a simple, concrete linguistic task, the patient generated cogent, grammatically correct sentences with 70% accuracy without skilled interventions. Given minimal cueing to complete his sentences and expand information content, accuracy improved to 87%. 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 Patient able to produce 4 out of 4 target phrases fluently (100% accuracy), given minimal cueing to utilize easy onset technique.       Assessment / Recommendations / Plan   Plan  Continue with current plan of care      Progression Toward Goals   Progression toward goals  Progressing toward goals       SLP Education - 05/10/19 0902    Education Details  Reviewed easy onset fluency technique.     Person(s) Educated  Patient    Methods  Explanation;Demonstration    Comprehension  Verbalized understanding;Returned demonstration         SLP Long Term Goals - 05/06/19 1024      SLP LONG TERM GOAL #1   Title  Patient will name common objects, without extraneous/irrelevant circumlocution, with 80% accuracy.    Status  Achieved      SLP LONG TERM GOAL #2   Title  Patient will generate grammatical and cogent sentence to complete simple/concrete linguistic task with 80% accuracy.    Status  Partially Met    Target Date  05/24/19  SLP LONG TERM GOAL #3   Title  Patient will complete high level word finding tasks with 80% accuracy.    Status  Partially Met    Target Date  06/21/19      SLP LONG TERM GOAL #4   Status  Partially Met       Plan - 05/10/19 0905    Clinical Impression Statement  The patient presents with mild aphasia following multiple CVAs. He is able to complete simple verbal expression tasks with minimal disfluency, but as task complexity increases, he exhibits increased disfluency and word finding difficulties. The patient habitually attempts to evade word finding errors using extraneous circumlocution and stopping and changing tactics to allow himself more time or to avoid the target word entirely. Patient demonstrates progress within the therapy context with completing his own sentences independently (rather than relying on his listener to do so for him) and reducing extraneous circumlocution. He would benefit from increased generalization of compensatory strategy use to his less structured communication outside of the therapy setting.    Speech Therapy Frequency  2x / week    Duration  Other (comment)    Treatment/Interventions  Language facilitation;SLP instruction and feedback;Patient/family education;Compensatory strategies    Potential to Achieve Goals  Good    Potential Considerations  Ability to learn/carryover information;Previous level of  function;Co-morbidities;Severity of impairments;Cooperation/participation level;Family/community support    SLP Home Exercise Plan  Reviewed    Consulted and Agree with Plan of Care  Patient       Patient will benefit from skilled therapeutic intervention in order to improve the following deficits and impairments:   Aphasia    Problem List Patient Active Problem List   Diagnosis Date Noted  . Syncope 02/10/2019  . Abrasion of knee, bilateral 02/08/2019  . Bradycardia with 31-40 beats per minute 02/08/2019  . Diabetes mellitus without complication ([place])   . Chronic venous insufficiency   . Cancer ([place])   . Asthma   . Arthritis   . GERD (gastroesophageal reflux disease)   . Hypogonadism in male   . MRSA (methicillin resistant Staphylococcus aureus) infection   . Sleep apnea   . Stented coronary artery   . Stroke ([place])   . Thrombocythemia ([place])   . 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 Acute ischemic left MCA stroke ([place]) 09/18/2018  . Coronary artery disease involving native coronary artery of native heart without angina pectoris   . H/O right coronary artery stent placement   . Allergic rhinitis 08/19/2018  . Cervical radiculopathy 08/17/2017  . Advanced care planning/counseling discussion 07/21/2017  . Skin lesion of right arm 12/16/2016  . Prostate cancer ([place]) 12/08/2016  . H/O bariatric surgery 07/17/2016  . Essential hypertension 04/24/2015  . Uncontrolled type 2 diabetes mellitus with hyperglycemia, without long-term current use of insulin ([place]) 04/24/2015  . Chronic sinusitis 08/13/2009  . Hyperlipidemia 04/02/2009  . Coronary atherosclerosis 04/02/2009  . 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 OSA (obstructive sleep apnea) 04/02/2009  . Arthritis of shoulder region, degenerative 04/02/2009  . Coronary artery disease 1996   Jaeson Molstad A. Francis Dowse.,  Graduate Clinician Lou Miner 05/10/2019, 11:52 AM  [place] MAIN [facility] SERVICES [address] [place], [name], [address] Phone: [phone]   Fax:  [phone]   Name: YULIAN GOSNEY MRN: [id number] Date of Birth: April 04, 1951

## 2019-05-10 NOTE — Therapy (Signed)
[place] MAIN [facility] SERVICES [address] [place], [name], [address] Phone: [phone]   Fax:  [phone]  Speech Language Pathology Treatment  Patient Details  Name: Andrew Cobb MRN: [id number] Date of Birth: 10-12-1950 Referring Provider (SLP): Sethi,Promod   Encounter Date: 05/10/2019  End of Session - 05/10/19 1610    Activity Tolerance  Patient tolerated treatment well       Past Medical History:  Diagnosis Date  . Arthritis   . Asthma   . Cancer [facility])    prostate  . Chronic venous insufficiency    varicose vein lower extremity with inflammation  . Coronary artery disease 1996   two stents placed   . Diabetes mellitus without complication ([place])    type 2 on metformin  . GERD (gastroesophageal reflux disease)    no issues since gastric bypass surgery as stated per pt  . Hyperlipidemia   . Hypogonadism in male   . MRSA (methicillin resistant Staphylococcus aureus) infection    07/30/2008 thru 08/07/2008  . Sleep apnea    on BIPAP  . Stented coronary artery   . Stroke ([place])   . Thrombocythemia [facility])     Past Surgical History:  Procedure Laterality Date  . ANGIOPLASTY    . ANGIOPLASTY     with stent 04/07/1995  . ANTERIOR CERVICAL DECOMPRESSION/DISCECTOMY FUSION 4 LEVELS N/A 08/17/2017   Procedure: Anterior discectomy with fusion and plate fixation Cervical Three-Four, Four-Five, Five-Six, and Six-Seven Fusion;  Surgeon: Ditty, Kevan Ny, MD;  Location: [place];  Service: Neurosurgery;  Laterality: N/A;  Anterior discectomy with fusion and plate fixation Cervical Three-Four, Four-Five, Five-Six, and Six-Seven Fusion   . APPENDECTOMY     1966  . BUBBLE STUDY  09/20/2018   Procedure: BUBBLE STUDY;  Surgeon: Josue Hector, MD;  Location: [place];  Service: Cardiovascular;;  . [place]  . CARDIOVASCULAR STRESS TEST     07/31/2011  . CARPAL TUNNEL RELEASE Left   . CHOLECYSTECTOMY     2006   . colonscopy      08/25/2012  . EYE SURGERY Bilateral    cataract  . FUNCTIONAL ENDOSCOPIC SINUS SURGERY     11/10/2013  . GASTRIC BYPASS     10/05/2012  . HERNIA REPAIR     left inguinal 1981  . INCISION AND DRAINAGE ABSCESS Right 02/26/2017   Procedure: INCISION AND DRAINAGE ABSCESS;  Surgeon: Nickie Retort, MD;  Location: [facility];  Service: Urology;  Laterality: Right;  . JOINT REPLACEMENT     bilateral  . left ankle surgery      05/03/2003   . left carpel tunnel      09/18/1993  . left knee meniscal tear      01/25/2010  . left knee meniscal tear repair      05/04/1996  . left rotator cuff repair      05/03/2003   . LOOP RECORDER INSERTION N/A 09/20/2018   Procedure: LOOP RECORDER INSERTION;  Surgeon: Evans Lance, MD;  Location: [place] CV LAB;  Service: Cardiovascular;  Laterality: N/A;  . REPLACEMENT TOTAL KNEE BILATERAL  07/13/2015  . right ankle surgery      fracture has 2 screws 07/07/1997  . right carpel tunnel      05/16/1992  . right shoulder replacement      01/27/2006  . SCROTAL EXPLORATION Right 02/26/2017   Procedure: SCROTUM EXPLORATION;  Surgeon: Nickie Retort, MD;  Location: [facility];  Service: Urology;  Laterality: Right;  . TEE WITHOUT CARDIOVERSION N/A 09/20/2018   Procedure: TRANSESOPHAGEAL ECHOCARDIOGRAM (TEE);  Surgeon: Josue Hector, MD;  Location: [facility] ENDOSCOPY;  Service: Cardiovascular;  Laterality: N/A;  . TOTAL KNEE ARTHROPLASTY Left 07/13/2015   Procedure: LEFT TOTAL KNEE ARTHROPLASTY;  Surgeon: Gaynelle Arabian, MD;  [facility];  Service: Orthopedics;  Laterality: Left;    There were no vitals filed for this visit.  Subjective Assessment - 05/10/19 1608    Subjective  The patient was alert, cooperative, and pleasant throughout the therapy session. 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            ADULT SLP TREATMENT - 05/10/19 1607      General Information   Behavior/Cognition   Alert;Cooperative;Pleasant mood;Hard of hearing    HPI  Pt is a 68 y.o. male with history of thrombocythemia, stroke, stent in coronary artery, hyperlidemia, diabetes, CAD. S/P (01/17/2019) ICH (intracerebral hemorrhage) (HCC) L tempoparietal infarct w. petechial hemorrhage and new L parietal hemorrhage into old L MCA infarct      Treatment Provided   Treatment provided  Cognitive-Linquistic      Pain Assessment   Pain Assessment  No/denies pain      Cognitive-Linquistic Treatment   Treatment focused on  Aphasia    Skilled Treatment  During a high level word finding task, the patient achieved 80% accuracy independently. Given semantic cueing, accuracy increased to 89%, and given additional phonemic cueing, accuracy further increased to 91%. During a simple, concrete linguistic task, the patient generated cogent, grammatically correct sentences with 75% accuracy without skilled interventions. Given minimal cueing to complete his sentences and expand information content, accuracy improved to 90%. Patient demonstrated comprehension of complex written information with 85% accuracy independently. Given corrective feedback and verbal cueing, accuracy improved to 93%. Patient read complex sentences aloud with 65% fluency. Given minimal cueing for attention to errors and utilization of easy onset fluency technique, accuracy increased to 80%. /k/ and /?/ phonemes noted to trigger disfluency during this task.      Assessment / Recommendations / Plan   Plan  Continue with current plan of care      Progression Toward Goals   Progression toward goals  Progressing toward goals       SLP Education - 05/10/19 1610    Education Details  It's OK that it takes longer - take the time you need to finish your thoughts.    Person(s) Educated  Patient    Methods  Explanation    Comprehension  Verbalized understanding         SLP Long Term Goals - 05/06/19 1024      SLP LONG TERM GOAL #1   Title  Patient  will name common objects, without extraneous/irrelevant circumlocution, with 80% accuracy.    Status  Achieved      SLP LONG TERM GOAL #2   Title  Patient will generate grammatical and cogent sentence to complete simple/concrete linguistic task with 80% accuracy.    Status  Partially Met    Target Date  05/24/19      SLP LONG TERM GOAL #3   Title  Patient will complete high level word finding tasks with 80% accuracy.    Status  Partially Met    Target Date  06/21/19      SLP LONG TERM GOAL #4   Status  Partially Met       Plan - 05/10/19 1610  Clinical Impression Statement  The patient presents with mild aphasia following multiple CVAs. He is able to complete simple verbal expression tasks with minimal disfluency, but as task complexity increases, he exhibits increased disfluency and word finding difficulties. Patient demonstrates progress within the therapy context with completing his own sentences independently (rather than relying on his listener to do so for him) and reducing extraneous circumlocution and stopping/changing tactics for evasion of word finding errors. He would benefit from increased generalization of compensatory strategy use to his less structured communication outside of the therapy setting.    Speech Therapy Frequency  2x / week    Duration  Other (comment)    Treatment/Interventions  Language facilitation;SLP instruction and feedback;Patient/family education;Compensatory strategies    Potential to Achieve Goals  Good    Potential Considerations  Ability to learn/carryover information;Previous level of function;Co-morbidities;Severity of impairments;Cooperation/participation level;Family/community support    SLP Home Exercise Plan  Reviewed    Consulted and Agree with Plan of Care  Patient       Patient will benefit from skilled therapeutic intervention in order to improve the following deficits and impairments:   Aphasia    Problem List Patient Active  Problem List   Diagnosis Date Noted  . Syncope 02/10/2019  . Abrasion of knee, bilateral 02/08/2019  . Bradycardia with 31-40 beats per minute 02/08/2019  . Diabetes mellitus without complication ([place])   . Chronic venous insufficiency   . Cancer ([place])   . Asthma   . Arthritis   . GERD (gastroesophageal reflux disease)   . Hypogonadism in male   . MRSA (methicillin resistant Staphylococcus aureus) infection   . Sleep apnea   . Stented coronary artery   . Stroke ([place])   . Thrombocythemia ([place])   . 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 Acute ischemic left MCA stroke ([place]) 09/18/2018  . Coronary artery disease involving native coronary artery of native heart without angina pectoris   . H/O right coronary artery stent placement   . Allergic rhinitis 08/19/2018  . Cervical radiculopathy 08/17/2017  . Advanced care planning/counseling discussion 07/21/2017  . Skin lesion of right arm 12/16/2016  . Prostate cancer ([place]) 12/08/2016  . H/O bariatric surgery 07/17/2016  . Essential hypertension 04/24/2015  . Uncontrolled type 2 diabetes mellitus with hyperglycemia, without long-term current use of insulin ([place]) 04/24/2015  . Chronic sinusitis 08/13/2009  . Hyperlipidemia 04/02/2009  . Coronary atherosclerosis 04/02/2009  . 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 OSA (obstructive sleep apnea) 04/02/2009  . Arthritis of shoulder region, degenerative 04/02/2009  . Coronary artery disease 1996   Andrew Cobb A. Francis Dowse., Graduate Clinician Vella Kohler 05/10/2019, 4:11 PM  [place] MAIN [facility] SERVICES [address] [place], [name], [address] Phone: [phone]   Fax:  [phone]   Name: Andrew Cobb MRN: [id number] Date of Birth: [birth year]

## 2019-05-11 ENCOUNTER — Encounter: Payer: PPO | Admitting: Speech Pathology

## 2019-05-16 ENCOUNTER — Other Ambulatory Visit: Payer: Self-pay

## 2019-05-16 ENCOUNTER — Ambulatory Visit: Payer: PPO | Admitting: Speech Pathology

## 2019-05-16 DIAGNOSIS — R4701 Aphasia: Secondary | ICD-10-CM

## 2019-05-17 ENCOUNTER — Encounter: Payer: Self-pay | Admitting: Speech Pathology

## 2019-05-17 NOTE — Therapy (Signed)
[place] MAIN [facility] SERVICES [address] [place], [name], [address] Phone: [phone]   Fax:  [phone]  Speech Language Pathology Treatment  Patient Details  Name: Andrew Cobb MRN: [id number] Date of Birth: Feb 10, 1951 Referring Provider (SLP): Sethi,Promod   Encounter Date: 05/16/2019  End of Session - 05/17/19 0827    Visit Number  13    Number of Visits  25    Date for SLP Re-Evaluation  06/21/19    Authorization Type  Medicare    Authorization Time Period  05/09/2019    Authorization - Visit Number  3    Authorization - Number of Visits  10    SLP Start Time  3009    SLP Stop Time   1500    SLP Time Calculation (min)  45 min    Activity Tolerance  Patient tolerated treatment well       Past Medical History:  Diagnosis Date  . Arthritis   . Asthma   . Cancer [facility])    prostate  . Chronic venous insufficiency    varicose vein lower extremity with inflammation  . Coronary artery disease 1996   two stents placed   . Diabetes mellitus without complication ([place])    type 2 on metformin  . GERD (gastroesophageal reflux disease)    no issues since gastric bypass surgery as stated per pt  . Hyperlipidemia   . Hypogonadism in male   . MRSA (methicillin resistant Staphylococcus aureus) infection    07/30/2008 thru 08/07/2008  . Sleep apnea    on BIPAP  . Stented coronary artery   . Stroke ([place])   . Thrombocythemia [facility])     Past Surgical History:  Procedure Laterality Date  . ANGIOPLASTY    . ANGIOPLASTY     with stent 04/07/1995  . ANTERIOR CERVICAL DECOMPRESSION/DISCECTOMY FUSION 4 LEVELS N/A 08/17/2017   Procedure: Anterior discectomy with fusion and plate fixation Cervical Three-Four, Four-Five, Five-Six, and Six-Seven Fusion;  Surgeon: Ditty, Kevan Ny, MD;  Location: [place];  Service: Neurosurgery;  Laterality: N/A;  Anterior discectomy with fusion and plate fixation Cervical Three-Four, Four-Five, Five-Six, and  Six-Seven Fusion   . APPENDECTOMY     1966  . BUBBLE STUDY  09/20/2018   Procedure: BUBBLE STUDY;  Surgeon: Josue Hector, MD;  Location: [place];  Service: Cardiovascular;;  . [place]  . CARDIOVASCULAR STRESS TEST     07/31/2011  . CARPAL TUNNEL RELEASE Left   . CHOLECYSTECTOMY     2006  . colonscopy      08/25/2012  . EYE SURGERY Bilateral    cataract  . FUNCTIONAL ENDOSCOPIC SINUS SURGERY     11/10/2013  . GASTRIC BYPASS     10/05/2012  . HERNIA REPAIR     left inguinal 1981  . INCISION AND DRAINAGE ABSCESS Right 02/26/2017   Procedure: INCISION AND DRAINAGE ABSCESS;  Surgeon: Nickie Retort, MD;  Location: [facility];  Service: Urology;  Laterality: Right;  . JOINT REPLACEMENT     bilateral  . left ankle surgery      05/03/2003   . left carpel tunnel      09/18/1993  . left knee meniscal tear      01/25/2010  . left knee meniscal tear repair      05/04/1996  . left rotator cuff repair      05/03/2003   . LOOP RECORDER INSERTION N/A 09/20/2018   Procedure: LOOP RECORDER INSERTION;  Surgeon: Evans Lance, MD;  Location: [place] CV LAB;  Service: Cardiovascular;  Laterality: N/A;  . REPLACEMENT TOTAL KNEE BILATERAL  07/13/2015  . right ankle surgery      fracture has 2 screws 07/07/1997  . right carpel tunnel      05/16/1992  . right shoulder replacement      01/27/2006  . SCROTAL EXPLORATION Right 02/26/2017   Procedure: SCROTUM EXPLORATION;  Surgeon: Nickie Retort, MD;  Location: [facility];  Service: Urology;  Laterality: Right;  . TEE WITHOUT CARDIOVERSION N/A 09/20/2018   Procedure: TRANSESOPHAGEAL ECHOCARDIOGRAM (TEE);  Surgeon: Josue Hector, MD;  Location: [facility];  Service: Cardiovascular;  Laterality: N/A;  . TOTAL KNEE ARTHROPLASTY Left 07/13/2015   Procedure: LEFT TOTAL KNEE ARTHROPLASTY;  Surgeon: Gaynelle Arabian, MD;  [facility];  Service: Orthopedics;  Laterality: Left;    There were no vitals filed for this  visit.  Subjective Assessment - 05/17/19 0826    Subjective  The patient was alert, cooperative, and pleasant throughout the therapy session. He shared a new photo taken over the weekend at his first booking playing [name] for this holiday season.            ADULT SLP TREATMENT - 05/17/19 0001      General Information   Behavior/Cognition  Alert;Cooperative;Pleasant mood;Hard of hearing    HPI  Pt is a 68 y.o. male with history of thrombocythemia, stroke, stent in coronary artery, hyperlidemia, diabetes, CAD. S/P (01/17/2019) ICH (intracerebral hemorrhage) (HCC) L tempoparietal infarct w. petechial hemorrhage and new L parietal hemorrhage into old L MCA infarct      Treatment Provided   Treatment provided  Cognitive-Linquistic      Pain Assessment   Pain Assessment  No/denies pain      Cognitive-Linquistic Treatment   Treatment focused on  Aphasia    Skilled Treatment  Patient reported no instances of disfluency or word finding difficulties while working as [employer] for the first time since his stroke last weekend. He independently stated that slowing his speech rate and overarticulating his speech sounds facilitated his fluency. Patient completed abstract verbal analogies with 75% accuracy independently. Given corrective feedback and moderate verbal cueing, accuracy increased to 90%. Patient had difficulty constructing complete, cogent, grammatically correct sentences to explain relationships between word pairs. Given multiple repetitions of task instruction and moderate cueing to expand information content, patient achieved 70% overall accuracy with this task. Patient produced multisyllabic words containing /?/ phoneme (which was noted to trigger disfluency during his most recent treatment session) without disfluency with 72% accuracy without interventions. Given corrective feedback and a direct model to imitate, accuracy increased to 92%. Patient produced short sentences  containing /?/ phoneme without disfluency with 60% accuracy independently. Given minimal cueing to implement easy onset fluency technique, accuracy increased to 90%.      Assessment / Recommendations / Plan   Plan  Continue with current plan of care      Progression Toward Goals   Progression toward goals  Progressing toward goals       SLP Education - 05/17/19 0826    Education Details  Discussion of patient's progress to date, including identification of most effective compensatory strategies for improving his overall communication.    Person(s) Educated  Patient    Methods  Explanation    Comprehension  Verbalized understanding         SLP Long Term Goals - 05/06/19 1024      SLP  LONG TERM GOAL #1   Title  Patient will name common objects, without extraneous/irrelevant circumlocution, with 80% accuracy.    Status  Achieved      SLP LONG TERM GOAL #2   Title  Patient will generate grammatical and cogent sentence to complete simple/concrete linguistic task with 80% accuracy.    Status  Partially Met    Target Date  05/24/19      SLP LONG TERM GOAL #3   Title  Patient will complete high level word finding tasks with 80% accuracy.    Status  Partially Met    Target Date  06/21/19      SLP LONG TERM GOAL #4   Status  Partially Met       Plan - 05/17/19 5852    Clinical Impression Statement  The patient presents with mild aphasia following multiple CVAs. He is able to complete simple verbal expression tasks with minimal disfluency, but as task complexity increases, he exhibits increased disfluency and word finding difficulties. Patient demonstrates progress in the structured therapy setting with completing his own sentences independently (rather than relying on his listener to do so for him) and reducing extraneous circumlocution and stopping/changing tactics for evasion of word finding errors. Patient also reports improved speech fluency and word retrieval in his less  structured communication outside of the therapy context. Will continue with ST intervention for restorative and compensatory treatment of aphasia.    Speech Therapy Frequency  2x / week    Duration  Other (comment)    Treatment/Interventions  Language facilitation;SLP instruction and feedback;Patient/family education;Compensatory strategies    Potential to Achieve Goals  Good    Potential Considerations  Ability to learn/carryover information;Previous level of function;Co-morbidities;Severity of impairments;Cooperation/participation level;Family/community support    SLP Home Exercise Plan  Reviewed    Consulted and Agree with Plan of Care  Patient       Patient will benefit from skilled therapeutic intervention in order to improve the following deficits and impairments:   Aphasia    Problem List Patient Active Problem List   Diagnosis Date Noted  . Syncope 02/10/2019  . Abrasion of knee, bilateral 02/08/2019  . Bradycardia with 31-40 beats per minute 02/08/2019  . Diabetes mellitus without complication ([place])   . Chronic venous insufficiency   . Cancer ([place])   . Asthma   . Arthritis   . GERD (gastroesophageal reflux disease)   . Hypogonadism in male   . MRSA (methicillin resistant Staphylococcus aureus) infection   . Sleep apnea   . Stented coronary artery   . Stroke ([place])   . Thrombocythemia ([place])   . 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 Acute ischemic left MCA stroke ([place]) 09/18/2018  . Coronary artery disease involving native coronary artery of native heart without angina pectoris   . H/O right coronary artery stent placement   . Allergic rhinitis 08/19/2018  . Cervical radiculopathy 08/17/2017  . Advanced care planning/counseling discussion 07/21/2017  . Skin lesion of right arm 12/16/2016  . Prostate cancer ([place]) 12/08/2016  . H/O bariatric surgery 07/17/2016  .  Essential hypertension 04/24/2015  . Uncontrolled type 2 diabetes mellitus with hyperglycemia, without long-term current use of insulin ([place]) 04/24/2015  . Chronic sinusitis 08/13/2009  . Hyperlipidemia 04/02/2009  . Coronary atherosclerosis 04/02/2009  . 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 OSA (obstructive sleep apnea) 04/02/2009  . Arthritis of shoulder region, degenerative  04/02/2009  . Coronary artery disease 1996   Andrew Minnie A. Francis Dowse., Graduate Clinician Vella Kohler 05/17/2019, 8:28 AM  [place] MAIN [facility] SERVICES [address] [place], [name], [address] Phone: [phone]   Fax:  [phone]   Name: Andrew Cobb MRN: [id number] Date of Birth: [birth year]

## 2019-05-18 ENCOUNTER — Encounter: Payer: Self-pay | Admitting: Speech Pathology

## 2019-05-18 ENCOUNTER — Ambulatory Visit: Payer: PPO | Admitting: Speech Pathology

## 2019-05-18 ENCOUNTER — Other Ambulatory Visit: Payer: Self-pay

## 2019-05-18 DIAGNOSIS — R4701 Aphasia: Secondary | ICD-10-CM | POA: Diagnosis not present

## 2019-05-18 NOTE — Therapy (Signed)
[place] MAIN [facility] SERVICES [address] [place], [name], [address] Phone: [phone]   Fax:  [phone]  Speech Language Pathology Treatment  Speech Therapy Progress Note   Dates of reporting period  05/09/2019   to   05/18/2019   Patient Details  Name: Andrew Cobb MRN: [id number] Date of Birth: 1951-05-01 Referring Provider (SLP): Sethi,Promod   Encounter Date: 05/18/2019  End of Session - 05/18/19 1529    Visit Number  14    Number of Visits  25    Date for SLP Re-Evaluation  06/21/19    Authorization Type  Medicare    Authorization Time Period  05/09/2019    Authorization - Visit Number  4    Authorization - Number of Visits  10    SLP Start Time  1400    SLP Stop Time   1450    SLP Time Calculation (min)  50 min    Activity Tolerance  Patient tolerated treatment well       Past Medical History:  Diagnosis Date  . Arthritis   . Asthma   . Cancer [facility])    prostate  . Chronic venous insufficiency    varicose vein lower extremity with inflammation  . Coronary artery disease 1996   two stents placed   . Diabetes mellitus without complication ([place])    type 2 on metformin  . GERD (gastroesophageal reflux disease)    no issues since gastric bypass surgery as stated per pt  . Hyperlipidemia   . Hypogonadism in male   . MRSA (methicillin resistant Staphylococcus aureus) infection    07/30/2008 thru 08/07/2008  . Sleep apnea    on BIPAP  . Stented coronary artery   . Stroke ([place])   . Thrombocythemia [facility])     Past Surgical History:  Procedure Laterality Date  . ANGIOPLASTY    . ANGIOPLASTY     with stent 04/07/1995  . ANTERIOR CERVICAL DECOMPRESSION/DISCECTOMY FUSION 4 LEVELS N/A 08/17/2017   Procedure: Anterior discectomy with fusion and plate fixation Cervical Three-Four, Four-Five, Five-Six, and Six-Seven Fusion;  Surgeon: Ditty, Kevan Ny, MD;  Location: [place];  Service: Neurosurgery;  Laterality: N/A;   Anterior discectomy with fusion and plate fixation Cervical Three-Four, Four-Five, Five-Six, and Six-Seven Fusion   . APPENDECTOMY     1966  . BUBBLE STUDY  09/20/2018   Procedure: BUBBLE STUDY;  Surgeon: Josue Hector, MD;  Location: [place];  Service: Cardiovascular;;  . [place]  . CARDIOVASCULAR STRESS TEST     07/31/2011  . CARPAL TUNNEL RELEASE Left   . CHOLECYSTECTOMY     2006  . colonscopy      08/25/2012  . EYE SURGERY Bilateral    cataract  . FUNCTIONAL ENDOSCOPIC SINUS SURGERY     11/10/2013  . GASTRIC BYPASS     10/05/2012  . HERNIA REPAIR     left inguinal 1981  . INCISION AND DRAINAGE ABSCESS Right 02/26/2017   Procedure: INCISION AND DRAINAGE ABSCESS;  Surgeon: Nickie Retort, MD;  Location: [facility];  Service: Urology;  Laterality: Right;  . JOINT REPLACEMENT     bilateral  . left ankle surgery      05/03/2003   . left carpel tunnel      09/18/1993  . left knee meniscal tear      01/25/2010  . left knee meniscal tear repair      05/04/1996  . left rotator cuff repair  05/03/2003   . LOOP RECORDER INSERTION N/A 09/20/2018   Procedure: LOOP RECORDER INSERTION;  Surgeon: Evans Lance, MD;  Location: [place] CV LAB;  Service: Cardiovascular;  Laterality: N/A;  . REPLACEMENT TOTAL KNEE BILATERAL  07/13/2015  . right ankle surgery      fracture has 2 screws 07/07/1997  . right carpel tunnel      05/16/1992  . right shoulder replacement      01/27/2006  . SCROTAL EXPLORATION Right 02/26/2017   Procedure: SCROTUM EXPLORATION;  Surgeon: Nickie Retort, MD;  Location: [facility];  Service: Urology;  Laterality: Right;  . TEE WITHOUT CARDIOVERSION N/A 09/20/2018   Procedure: TRANSESOPHAGEAL ECHOCARDIOGRAM (TEE);  Surgeon: Josue Hector, MD;  Location: [facility] END[facility];  Service: Cardiovascular;  Laterality: N/A;  . TOTAL KNEE ARTHROPLASTY Left 07/13/2015   Procedure: LEFT TOTAL KNEE ARTHROPLASTY;  Surgeon: Gaynelle Arabian, MD;   [facility];  Service: Orthopedics;  Laterality: Left;    There were no vitals filed for this visit.  Subjective Assessment - 05/18/19 1526    Subjective  The patient was alert, cooperative, and pleasant throughout the therapy session. He shared that he has decided to take a break from ST due to his busy holiday season working as Pharmacist, hospital at many events.              SLP Education - 05/18/19 1526    Education Details  Review of strategies for enhancing patient's language and fluency skills.    Person(s) Educated  Patient    Methods  Explanation    Comprehension  Verbalized understanding;Returned demonstration         SLP Long Term Goals - 05/19/19 1019      SLP LONG TERM GOAL #2   Title  Patient will generate grammatical and cogent sentence to complete simple/concrete linguistic task with 80% accuracy.    Status  Partially Met    Target Date  05/24/19      SLP LONG TERM GOAL #3   Title  Patient will complete high level word finding tasks with 80% accuracy.    Status  Partially Met    Target Date  06/21/19      SLP LONG TERM GOAL #4   Title  Patient will complete complex auditory attention/vigilance/memory tasks with 80% accuracy.    Status  Achieved      SLP LONG TERM GOAL #5   Title  Patient will write grammatical and cogent sentence to complete simple/concrete linguistic task with 80% accuracy.    Status  Partially Met    Target Date  06/21/19      SLP LONG TERM GOAL #6   Title  Patient will demonstrate reading comprehension for complex/abstract written information with 80% accuracy.    Status  Partially Met    Target Date  06/21/19       Plan - 05/18/19 1529    Clinical Impression Statement  The patient presents with mild aphasia following multiple CVAs. He is able to complete simple verbal expression tasks with minimal disfluency, but as task complexity increases, he experiences increased instances of disfluency and word finding difficulties.  Patient demonstrates progress in the structured therapy setting with completing his own sentences independently (rather than relying on his listener to do so for him), reducing extraneous circumlocution and stopping/changing tactics for evasion of word finding errors, and independent implementation of easy onset fluency technique when needed. Patient also reports improved speech fluency and word retrieval in his less  structured communication outside of the therapy context. Will put speech therapy on hold  per patient's stated desire to "take a break" due to his busy work schedule playing [employer] at various events this holiday season.    Speech Therapy Frequency  2x / week    Duration  Other (comment)    Treatment/Interventions  Language facilitation;SLP instruction and feedback;Patient/family education;Compensatory strategies    Potential to Achieve Goals  Good    Potential Considerations  Ability to learn/carryover information;Previous level of function;Co-morbidities;Severity of impairments;Cooperation/participation level;Family/community support    SLP Home Exercise Plan  Reviewed    Consulted and Agree with Plan of Care  Patient       Patient will benefit from skilled therapeutic intervention in order to improve the following deficits and impairments:   Aphasia    Problem List Patient Active Problem List   Diagnosis Date Noted  . Syncope 02/10/2019  . Abrasion of knee, bilateral 02/08/2019  . Bradycardia with 31-40 beats per minute 02/08/2019  . Diabetes mellitus without complication ([place])   . Chronic venous insufficiency   . Cancer ([place])   . Asthma   . Arthritis   . GERD (gastroesophageal reflux disease)   . Hypogonadism in male   . MRSA (methicillin resistant Staphylococcus aureus) infection   . Sleep apnea   . Stented coronary artery   . Stroke ([place])   . Thrombocythemia ([place])   . 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 petechial hmg and new L  parietal hmg into old L MCA infarct 01/17/2019  . Acute ischemic left MCA stroke ([place]) 09/18/2018  . Coronary artery disease involving native coronary artery of native heart without angina pectoris   . H/O right coronary artery stent placement   . Allergic rhinitis 08/19/2018  . Cervical radiculopathy 08/17/2017  . Advanced care planning/counseling discussion 07/21/2017  . Skin lesion of right arm 12/16/2016  . Prostate cancer ([place]) 12/08/2016  . H/O bariatric surgery 07/17/2016  . Essential hypertension 04/24/2015  . Uncontrolled type 2 diabetes mellitus with hyperglycemia, without long-term current use of insulin ([place]) 04/24/2015  . Chronic sinusitis 08/13/2009  . Hyperlipidemia 04/02/2009  . Coronary atherosclerosis 04/02/2009  . 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 OSA (obstructive sleep apnea) 04/02/2009  . Arthritis of shoulder region, degenerative 04/02/2009  . Coronary artery disease 1996   Gerilyn Stargell A. Francis Dowse., Graduate Clinician Lou Miner 05/19/2019, 10:22 AM  [place] MAIN [facility] SERVICES [address] [place], [name], [address] Phone: [phone]   Fax:  [phone]   Name: JIOVANI MCCAMMON MRN: [id number] Date of Birth: [birth year]

## 2019-05-21 LAB — CUP PACEART REMOTE DEVICE CHECK
Date Time Interrogation Session: 20201114200420
Implantable Pulse Generator Implant Date: 20200316

## 2019-05-23 ENCOUNTER — Ambulatory Visit: Payer: PPO | Admitting: Speech Pathology

## 2019-05-23 ENCOUNTER — Ambulatory Visit (INDEPENDENT_AMBULATORY_CARE_PROVIDER_SITE_OTHER): Payer: PPO | Admitting: *Deleted

## 2019-05-23 DIAGNOSIS — I639 Cerebral infarction, unspecified: Secondary | ICD-10-CM

## 2019-05-25 ENCOUNTER — Encounter: Payer: PPO | Admitting: Speech Pathology

## 2019-05-30 ENCOUNTER — Encounter: Payer: PPO | Admitting: Speech Pathology

## 2019-05-31 ENCOUNTER — Telehealth: Payer: Self-pay | Admitting: Family Medicine

## 2019-05-31 NOTE — Chronic Care Management (AMB) (Signed)
°  Care Management   Note  05/31/2019 Name: VALE PALEY MRN: [id number] DOB: 1951/06/12  LEOR DINSE is a 68 y.o. year old male who is a primary care patient of Guadalupe Maple, MD and is actively engaged with the care management team. I reached out to [name] by phone today to assist with scheduling a follow up appointment with the Pharmacist  Follow up plan: Telephone appointment with CCM team member scheduled for: 07/06/2019  [place], [address] Management  [place], [place] [address] Direct Dial: [email or web address]  Website: [email or web address]

## 2019-05-31 NOTE — Chronic Care Management (AMB) (Signed)
°  Chronic Care Management   Outreach Note  05/31/2019 Name: Andrew Cobb MRN: [id number] DOB: 1950-11-25  Referred by: Guadalupe Maple, MD Reason for referral : Chronic Care Management (Initial CCM F/U outreach was unsuccessful. )   First unsuccessful outreach attempt was made today to schedule follow up appointment with care management team member.   Follow Up Plan: A HIPPA compliant phone message was left for the patient providing contact information and requesting a return call.  The care management team will reach out to the patient again over the next 7 days.  If patient returns call to provider office, please advise to call [place] [facility] [place], [employer] Management  [place], [place] [address] Direct Dial: [email or web address]  Website: [email or web address]

## 2019-06-01 ENCOUNTER — Encounter: Payer: Self-pay | Admitting: *Deleted

## 2019-06-01 ENCOUNTER — Other Ambulatory Visit: Payer: Self-pay

## 2019-06-01 ENCOUNTER — Encounter: Payer: PPO | Admitting: Speech Pathology

## 2019-06-01 ENCOUNTER — Encounter: Payer: Self-pay | Admitting: Adult Health

## 2019-06-01 ENCOUNTER — Ambulatory Visit (INDEPENDENT_AMBULATORY_CARE_PROVIDER_SITE_OTHER): Payer: PPO | Admitting: Adult Health

## 2019-06-01 VITALS — BP 120/65 | HR 61 | Temp 98.4°F | Ht 70.0 in | Wt 204.8 lb

## 2019-06-01 DIAGNOSIS — R4701 Aphasia: Secondary | ICD-10-CM | POA: Diagnosis not present

## 2019-06-01 DIAGNOSIS — E119 Type 2 diabetes mellitus without complications: Secondary | ICD-10-CM

## 2019-06-01 DIAGNOSIS — I639 Cerebral infarction, unspecified: Secondary | ICD-10-CM

## 2019-06-01 DIAGNOSIS — I10 Essential (primary) hypertension: Secondary | ICD-10-CM

## 2019-06-01 DIAGNOSIS — E782 Mixed hyperlipidemia: Secondary | ICD-10-CM | POA: Diagnosis not present
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
## 2019-06-01 NOTE — Patient Instructions (Signed)
Continue aspirin 81 mg daily  and Lipitor for secondary stroke prevention  Continue to follow up with PCP regarding cholesterol, blood pressure and diabetes management   Continue to monitor blood pressure at home  Continue to monitor loop recorder for atrial fibrillation  As you have not had any additional loss of consciousness episodes and recent EEG did not show any abnormalities, you are cleared to return to driving from our standpoint.  We will provide you with a letter today  Maintain strict control of hypertension with blood pressure goal below 130/90, diabetes with hemoglobin A1c goal below 6.5% and cholesterol with LDL cholesterol (bad cholesterol) goal below 70 mg/dL. I also advised the patient to eat a healthy diet with plenty of whole grains, cereals, fruits and vegetables, exercise regularly and maintain ideal body weight.  Followup in the future with me in 6 months or call earlier if needed       Thank you for coming to see [place] at [facility]. I hope we have been able to provide you high quality care today.  You may receive a patient satisfaction survey over the next few weeks. We would appreciate your feedback and comments so that we may continue to improve ourselves and the health of our patients.

## 2019-06-06 ENCOUNTER — Encounter: Payer: PPO | Admitting: Speech Pathology

## 2019-06-08 ENCOUNTER — Encounter: Payer: PPO | Admitting: Speech Pathology

## 2019-06-16 ENCOUNTER — Encounter: Payer: PPO | Admitting: Speech Pathology

## 2019-06-19 ENCOUNTER — Encounter: Payer: Self-pay | Admitting: Nurse Practitioner

## 2019-06-19 DIAGNOSIS — E559 Vitamin D deficiency, unspecified: Secondary | ICD-10-CM | POA: Insufficient documentation

## 2019-06-19 DIAGNOSIS — E1169 Type 2 diabetes mellitus with other specified complication: Secondary | ICD-10-CM | POA: Insufficient documentation

## 2019-06-19 DIAGNOSIS — R4701 Aphasia: Secondary | ICD-10-CM | POA: Insufficient documentation

## 2019-06-19 DIAGNOSIS — E785 Hyperlipidemia, unspecified: Secondary | ICD-10-CM | POA: Insufficient documentation

## 2019-06-20 ENCOUNTER — Encounter: Payer: PPO | Admitting: Speech Pathology

## 2019-06-20 ENCOUNTER — Other Ambulatory Visit: Payer: Self-pay

## 2019-06-20 NOTE — Progress Notes (Signed)
Carelink Summary Report / Loop Recorder 

## 2019-06-21 ENCOUNTER — Encounter: Payer: Self-pay | Admitting: Nurse Practitioner

## 2019-06-21 ENCOUNTER — Other Ambulatory Visit: Payer: Self-pay

## 2019-06-21 ENCOUNTER — Ambulatory Visit (INDEPENDENT_AMBULATORY_CARE_PROVIDER_SITE_OTHER): Payer: PPO | Admitting: Nurse Practitioner

## 2019-06-21 VITALS — BP 131/81 | HR 61 | Temp 98.2°F | Ht 69.0 in | Wt 206.4 lb

## 2019-06-21 DIAGNOSIS — E1165 Type 2 diabetes mellitus with hyperglycemia: Secondary | ICD-10-CM | POA: Diagnosis not present

## 2019-06-21 DIAGNOSIS — E785 Hyperlipidemia, unspecified: Secondary | ICD-10-CM | POA: Diagnosis not present

## 2019-06-21 DIAGNOSIS — E538 Deficiency of other specified B group vitamins: Secondary | ICD-10-CM | POA: Diagnosis not present

## 2019-06-21 DIAGNOSIS — I639 Cerebral infarction, unspecified: Secondary | ICD-10-CM

## 2019-06-21 DIAGNOSIS — Z0000 Encounter for general adult medical examination without abnormal findings: Secondary | ICD-10-CM | POA: Diagnosis not present

## 2019-06-21 DIAGNOSIS — Z8546 Personal history of malignant neoplasm of prostate: Secondary | ICD-10-CM | POA: Diagnosis not present

## 2019-06-21 DIAGNOSIS — E1159 Type 2 diabetes mellitus with other circulatory complications: Secondary | ICD-10-CM | POA: Diagnosis not present

## 2019-06-21 DIAGNOSIS — I10 Essential (primary) hypertension: Secondary | ICD-10-CM | POA: Diagnosis not present

## 2019-06-21 DIAGNOSIS — E1169 Type 2 diabetes mellitus with other specified complication: Secondary | ICD-10-CM

## 2019-06-21 DIAGNOSIS — I2510 Atherosclerotic heart disease of native coronary artery without angina pectoris: Secondary | ICD-10-CM | POA: Diagnosis not present

## 2019-06-21 LAB — MICROALBUMIN, URINE WAIVED
Creatinine, Urine Waived: 200 mg/dL (ref 10–300)
Microalb, Ur Waived: 10 mg/L (ref 0–19)
Microalb/Creat Ratio: <30 mg/g (ref <30)

## 2019-06-21 LAB — BAYER DCA HB A1C WAIVED: HB A1C (BAYER DCA - WAIVED): 7 % — ABNORMAL HIGH (ref <7.0)

## 2019-06-21 MED ORDER — BENAZEPRIL HCL 40 MG PO TABS: 40.0000 mg | ORAL_TABLET | Freq: Every day | ORAL | 3 refills | Status: DC

## 2019-06-21 MED ORDER — AMITRIPTYLINE HCL 25 MG PO TABS: 25.0000 mg | ORAL_TABLET | Freq: Every day | ORAL | 3 refills | Status: DC

## 2019-06-21 MED ORDER — METFORMIN HCL 500 MG PO TABS: 1000.0000 mg | ORAL_TABLET | Freq: Two times a day (BID) | ORAL | 2 refills | Status: DC

## 2019-06-21 MED ORDER — ATORVASTATIN CALCIUM 40 MG PO TABS: 40.0000 mg | ORAL_TABLET | Freq: Every day | ORAL | 2 refills | Status: DC

## 2019-06-21 NOTE — Assessment & Plan Note (Signed)
Chronic, ongoing with BP below neuro goal <130/90.  Continue current medication regimen and adjust as needed.  Labs today.  Refills sent.  CCM referral.

## 2019-06-21 NOTE — Assessment & Plan Note (Addendum)
Chronic, ongoing with A1C today 7.0%, downward trend + urine ALB 10 and A:C <30.  Not at neuro goal of <6.5%.  Will place CCM referral, patient has cost concerns about medications.  Would benefit from addition of SGLT2 due to cardiac history.  Continue Metformin and diet focus at this time.  Check BS daily.  Return in 3 months.

## 2019-06-21 NOTE — Assessment & Plan Note (Signed)
Continue collaboration with neurology and will add on CCM team for ongoing continuity of care.

## 2019-06-21 NOTE — Assessment & Plan Note (Signed)
Chronic, ongoing.  Continue current medication regimen and adjust as needed to meet neuro goal of LDL <70.  Lipid panel and CMP today.

## 2019-06-21 NOTE — Assessment & Plan Note (Signed)
Continue ASA and maintaining tight lipid control, may need to increase Atorvastatin to 80 MG.  Lipid panel today.

## 2019-06-21 NOTE — Assessment & Plan Note (Signed)
Obtain PSA today, recommend he schedule f/u with Dr. Bernardo Heater for ongoing assessment.

## 2019-06-21 NOTE — Progress Notes (Signed)
BP 131/81   Pulse 61   Temp 98.2 F (36.8 C) (Oral)   Ht 5\' 9"  (1.753 m)   Wt 206 lb 6.4 oz (93.6 kg)   SpO2 99%   BMI 30.48 kg/m    Subjective:    Patient ID: Andrew Cobb, male    DOB: [birth year], 68 y.o.   MRN: [id number]  HPI: Andrew Cobb is a 68 y.o. male presenting on 06/21/2019 for comprehensive medical examination. Current medical complaints include:none  He currently lives with: wife Interim Problems from his last visit: no   DIABETES Last A1C in August 7.7%.  He continues on Metformin 1000 MG BID Hypoglycemic episodes:no Polydipsia/polyuria: no Visual disturbance: no Chest pain: no Paresthesias: no Glucose Monitoring: yes  Accucheck frequency: Daily  Fasting glucose: 130-150  Post prandial:  Evening:  Before meals: Taking Insulin?: no  Long acting insulin:  Short acting insulin: Blood Pressure Monitoring: daily Retinal Examination: Up to Date Foot Exam: Up to Date Pneumovax: refused Influenza: Up to Date Aspirin: yes   HYPERTENSION / HYPERLIPIDEMIA Continues Benazepril 40 MG and Atorvastatin 40 MG daily.  Followed by neurology due to recent stroke events x 3.  Last saw them 06/01/2019.  Currently has loop recorder in place.  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   Satisfied with current treatment? yes Duration of hypertension: chronic BP monitoring frequency: daily BP range: 125-140/70-75 BP medication side effects: no Duration of hyperlipidemia: chronic Cholesterol medication side effects: no Cholesterol supplements: none Medication compliance: good compliance Aspirin: yes Recent stressors: no Recurrent headaches: no Visual changes: no Palpitations: no Dyspnea: no Chest pain: no Lower extremity edema: no Dizzy/lightheaded: no  The ASCVD Risk score [name], et al., 2013) failed to calculate for the following reasons:   The patient has a prior MI or stroke diagnosis   HISTORY OF PROSTATE CA: Previously followed by Dr.  Bernardo Heater.  Last seen 02/11/2018 with biopsy noting no cancer.  He was to follow up in 6 months, but this was during time of initial CVA episode.  Would like PSA checked today, but prefers Dr. Bernardo Heater for DRE.  Functional Status Survey: Is the patient deaf or have difficulty hearing?: No Does the patient have difficulty seeing, even when wearing glasses/contacts?: No Does the patient have difficulty concentrating, remembering, or making decisions?: No Does the patient have difficulty walking or climbing stairs?: No Does the patient have difficulty dressing or bathing?: No Does the patient have difficulty doing errands alone such as visiting a doctor's office or shopping?: No  FALL RISK: Fall Risk  06/21/2019 08/09/2018 04/22/2018 07/21/2017 04/20/2017  Falls in the past year? 0 0 No No No  Number falls in past yr: 0 0 - - -  Injury with Fall? 0 0 - - -  Follow up Falls evaluation completed - - - -    Depression Screen Depression screen [facility] 2/9 06/21/2019 10/13/2018 04/22/2018 07/21/2017 04/20/2017  Decreased Interest 0 0 0 0 0  Down, Depressed, Hopeless 0 0 0 0 0  PHQ - 2 Score 0 0 0 0 0    Advanced Directives <no information>  Past Medical History:  Past Medical History:  Diagnosis Date  . Arthritis   . Asthma   . Cancer [facility])    prostate  . Chronic venous insufficiency    varicose vein lower extremity with inflammation  . Coronary artery disease 1996   two stents placed   . Diabetes mellitus without complication ([place])  type 2 on metformin  . GERD (gastroesophageal reflux disease)    no issues since gastric bypass surgery as stated per pt  . Hyperlipidemia   . Hypogonadism in male   . MRSA (methicillin resistant Staphylococcus aureus) infection    07/30/2008 thru 08/07/2008  . Sleep apnea    on BIPAP  . Stented coronary artery   . Stroke ([place])   . Thrombocythemia [facility])     Surgical History:  Past Surgical History:  Procedure Laterality Date  . ANGIOPLASTY    .  ANGIOPLASTY     with stent 04/07/1995  . ANTERIOR CERVICAL DECOMPRESSION/DISCECTOMY FUSION 4 LEVELS N/A 08/17/2017   Procedure: Anterior discectomy with fusion and plate fixation Cervical Three-Four, Four-Five, Five-Six, and Six-Seven Fusion;  Surgeon: Ditty, Kevan Ny, MD;  Location: [place];  Service: Neurosurgery;  Laterality: N/A;  Anterior discectomy with fusion and plate fixation Cervical Three-Four, Four-Five, Five-Six, and Six-Seven Fusion   . APPENDECTOMY     1966  . BUBBLE STUDY  09/20/2018   Procedure: BUBBLE STUDY;  Surgeon: Josue Hector, MD;  Location: [place];  Service: Cardiovascular;;  . [place]  . CARDIOVASCULAR STRESS TEST     07/31/2011  . CARPAL TUNNEL RELEASE Left   . CHOLECYSTECTOMY     2006  . colonscopy      08/25/2012  . EYE SURGERY Bilateral    cataract  . FUNCTIONAL ENDOSCOPIC SINUS SURGERY     11/10/2013  . GASTRIC BYPASS     10/05/2012  . HERNIA REPAIR     left inguinal 1981  . INCISION AND DRAINAGE ABSCESS Right 02/26/2017   Procedure: INCISION AND DRAINAGE ABSCESS;  Surgeon: Nickie Retort, MD;  Location: [facility];  Service: Urology;  Laterality: Right;  . JOINT REPLACEMENT     bilateral  . left ankle surgery      05/03/2003   . left carpel tunnel      09/18/1993  . left knee meniscal tear      01/25/2010  . left knee meniscal tear repair      05/04/1996  . left rotator cuff repair      05/03/2003   . LOOP RECORDER INSERTION N/A 09/20/2018   Procedure: LOOP RECORDER INSERTION;  Surgeon: Evans Lance, MD;  Location: [place] CV LAB;  Service: Cardiovascular;  Laterality: N/A;  . REPLACEMENT TOTAL KNEE BILATERAL  07/13/2015  . right ankle surgery      fracture has 2 screws 07/07/1997  . right carpel tunnel      05/16/1992  . right shoulder replacement      01/27/2006  . SCROTAL EXPLORATION Right 02/26/2017   Procedure: SCROTUM EXPLORATION;  Surgeon: Nickie Retort, MD;  Location: [facility];  Service:  Urology;  Laterality: Right;  . TEE WITHOUT CARDIOVERSION N/A 09/20/2018   Procedure: TRANSESOPHAGEAL ECHOCARDIOGRAM (TEE);  Surgeon: Josue Hector, MD;  Location: [facility];  Service: Cardiovascular;  Laterality: N/A;  . TOTAL KNEE ARTHROPLASTY Left 07/13/2015   Procedure: LEFT TOTAL KNEE ARTHROPLASTY;  Surgeon: Gaynelle Arabian, MD;  Location: [facility];  Service: Orthopedics;  Laterality: Left;    Medications:  Current Outpatient Medications on File Prior to Visit  Medication Sig  . aspirin EC 81 MG EC tablet Take 1 tablet (81 mg total) by mouth daily.  . cetirizine (ZYRTEC) 10 MG tablet Take 10 mg by mouth daily.  . Cholecalciferol (VITAMIN D) 2000 units CAPS Take 2,000 Units by mouth daily.  [name] Kitchen glucose blood test  strip 1 each by Other route 2 (two) times daily. DX E11.9  . Lancets (ONETOUCH ULTRASOFT) lancets 1 each by Other route 2 (two) times daily. Dx E11.9  . Magnesium 250 MG TABS Take 250 mg by mouth at bedtime.   . Multiple Vitamins-Minerals (MULTIVITAMIN PO) Take 1 tablet by mouth daily.   . valACYclovir (VALTREX) 500 MG tablet Take 1 tablet (500 mg total) by mouth daily. (Patient taking differently: Take 500 mg by mouth at bedtime. )   No current facility-administered medications on file prior to visit.    Allergies:  Allergies  Allergen Reactions  . Succinylsulphathiazole Rash  . Sulfamethoxazole-Trimethoprim Rash  . Tetracyclines & Related Rash    Social History:  Social History   Socioeconomic History  . Marital status: Married    Spouse name: Not on file  . Number of children: Not on file  . Years of education: Not on file  . Highest education level: Some college, no degree  Occupational History    Comment: drives for [employer]   Tobacco Use  . Smoking status: Former Smoker    Packs/day: 1.00    Years: 10.00    Pack years: 10.00    Types: Cigarettes    Quit date: 07/07/1984    Years since quitting: 34.9  . Smokeless tobacco: Never Used  . Tobacco comment: quit  1986  Substance and Sexual Activity  . Alcohol use: No    Alcohol/week: 0.0 standard drinks  . Drug use: No  . Sexual activity: Yes  Other Topics Concern  . Not on file  Social History Narrative  . Not on file   Social Determinants of Health   Financial Resource Strain:   . Difficulty of Paying Living Expenses: Not on file  Food Insecurity:   . Worried About Charity fundraiser in the Last Year: Not on file  . Ran Out of Food in the Last Year: Not on file  Transportation Needs:   . Lack of Transportation (Medical): Not on file  . Lack of Transportation (Non-Medical): Not on file  Physical Activity:   . Days of Exercise per Week: Not on file  . Minutes of Exercise per Session: Not on file  Stress:   . Feeling of Stress : Not on file  Social Connections:   . Frequency of Communication with Friends and Family: Not on file  . Frequency of Social Gatherings with Friends and Family: Not on file  . Attends Religious Services: Not on file  . Active Member of Clubs or Organizations: Not on file  . Attends Archivist Meetings: Not on file  . Marital Status: Not on file  Intimate Partner Violence:   . Fear of Current or Ex-Partner: Not on file  . Emotionally Abused: Not on file  . Physically Abused: Not on file  . Sexually Abused: Not on file   Social History   Tobacco Use  Smoking Status Former Smoker  . Packs/day: 1.00  . Years: 10.00  . Pack years: 10.00  . Types: Cigarettes  . Quit date: 07/07/1984  . Years since quitting: 34.9  Smokeless Tobacco Never Used  Tobacco Comment   quit 1986   Social History   Substance and Sexual Activity  Alcohol Use No  . Alcohol/week: 0.0 standard drinks    Family History:  Family History  Problem Relation Age of Onset  . Cancer Mother        pancreatic  . Diabetes Mother   . Stroke Mother   .  Heart disease Mother   . Hyperlipidemia Mother   . Hypertension Mother   . Heart attack Mother   . Heart disease Father     . Stroke Father   . Diabetes Father   . Hypertension Father   . Heart attack Father   . Hyperlipidemia Father   . Pancreatic cancer Father   . Cancer Sister   . Cancer Brother        lung  . Cancer Brother   . Kidney cancer Neg Hx   . Bladder Cancer Neg Hx   . Prostate cancer Neg Hx     Past medical history, surgical history, medications, allergies, family history and social history reviewed with patient today and changes made to appropriate areas of the chart.   Review of Systems - negative All other ROS negative except what is listed above and in the HPI.      Objective:    BP 131/81   Pulse 61   Temp 98.2 F (36.8 C) (Oral)   Ht 5\' 9"  (1.753 m)   Wt 206 lb 6.4 oz (93.6 kg)   SpO2 99%   BMI 30.48 kg/m   Wt Readings from Last 3 Encounters:  06/21/19 206 lb 6.4 oz (93.6 kg)  06/01/19 204 lb 12.8 oz (92.9 kg)  02/17/19 207 lb 9.6 oz (94.2 kg)    Physical Exam Vitals and nursing note reviewed.  Constitutional:      General: He is awake. He is not in acute distress.    Appearance: He is well-developed and overweight. He is not ill-appearing.  HENT:     Head: Normocephalic and atraumatic.     Right Ear: Hearing, tympanic membrane, ear canal and external ear normal. No drainage.     Left Ear: Hearing, tympanic membrane, ear canal and external ear normal. No drainage.  Eyes:     General: Lids are normal.        Right eye: No discharge.        Left eye: No discharge.     Extraocular Movements: Extraocular movements intact.     Conjunctiva/sclera: Conjunctivae normal.     Pupils: Pupils are equal, round, and reactive to light.     Visual Fields: Right eye visual fields normal and left eye visual fields normal.  Neck:     Thyroid: No thyromegaly.     Vascular: No carotid bruit.  Cardiovascular:     Rate and Rhythm: Normal rate and regular rhythm.     Heart sounds: Normal heart sounds, S1 normal and S2 normal. No murmur. No gallop.   Pulmonary:     Effort:  Pulmonary effort is normal. No accessory muscle usage or respiratory distress.     Breath sounds: Normal breath sounds.  Abdominal:     General: Bowel sounds are normal.     Palpations: Abdomen is soft. There is no hepatomegaly or splenomegaly.     Tenderness: There is no abdominal tenderness.     Hernia: There is no hernia in the left inguinal area or right inguinal area.  Genitourinary:    Comments: DRE deferred per patient request Musculoskeletal:        General: Normal range of motion.     Cervical back: Normal range of motion and neck supple.     Right lower leg: No edema.     Left lower leg: No edema.  Lymphadenopathy:     Head:     Right side of head: No submental, submandibular, tonsillar, preauricular or  posterior auricular adenopathy.     Left side of head: No submental, submandibular, tonsillar, preauricular or posterior auricular adenopathy.     Cervical: No cervical adenopathy.  Skin:    General: Skin is warm and dry.     Capillary Refill: Capillary refill takes less than 2 seconds.     Findings: No rash.  Neurological:     Mental Status: He is alert and oriented to person, place, and time.     Cranial Nerves: Cranial nerves are intact.     Gait: Gait is intact.     Deep Tendon Reflexes:     Reflex Scores:      Brachioradialis reflexes are 1+ on the right side and 1+ on the left side.      Patellar reflexes are 1+ on the right side and 1+ on the left side.    Comments: Intermittent expressive aphasia noted.  Psychiatric:        Attention and Perception: Attention normal.        Mood and Affect: Mood normal.        Speech: Speech normal.        Behavior: Behavior normal. Behavior is cooperative.        Thought Content: Thought content normal.        Judgment: Judgment normal.    Diabetic Foot Exam - Simple   Simple Foot Form Visual Inspection No deformities, no ulcerations, no other skin breakdown bilaterally: Yes Sensation Testing Intact to touch and  monofilament testing bilaterally: Yes Pulse Check Posterior Tibialis and Dorsalis pulse intact bilaterally: Yes Comments     Results for orders placed or performed in visit on 05/23/19  CUP PACEART REMOTE DEVICE CHECK  Result Value Ref Range   Date Time Interrogation Session [id number]    Pulse Generator Manufacturer MERM    Pulse Gen Model [id number] Reveal LINQ    Pulse Gen Serial Number [id number]    Clinic [facility]    Implantable Pulse Generator Type ICM/ILR    Implantable Pulse Generator Implant Date [id number]       Assessment & Plan:   Problem List Items Addressed This Visit      Cardiovascular and Mediastinum   Hypertension associated with diabetes ([place])    Chronic, ongoing with BP below neuro goal <130/90.  Continue current medication regimen and adjust as needed.  Labs today.  Refills sent.  CCM referral.      Relevant Medications   metFORMIN (GLUCOPHAGE) 500 MG tablet   benazepril (LOTENSIN) 40 MG tablet   atorvastatin (LIPITOR) 40 MG tablet   Other Relevant Orders   Bayer DCA Hb A1c Waived   Comprehensive metabolic panel   Coronary artery disease    Continue ASA and maintaining tight lipid control, may need to increase Atorvastatin to 80 MG.  Lipid panel today.      Relevant Medications   benazepril (LOTENSIN) 40 MG tablet   atorvastatin (LIPITOR) 40 MG tablet   Cryptogenic stroke [facility])    Continue collaboration with neurology and will add on CCM team for ongoing continuity of care.      Relevant Medications   benazepril (LOTENSIN) 40 MG tablet   atorvastatin (LIPITOR) 40 MG tablet     Endocrine   Uncontrolled type 2 diabetes mellitus with hyperglycemia, without long-term current use of insulin (HCC)    Chronic, ongoing with A1C today 7.0%, downward trend + urine ALB 10 and A:C <30.  Not at neuro goal of <6.5%.  Will place CCM  referral, patient has cost concerns about medications.  Would benefit from addition of SGLT2 due to cardiac history.   Continue Metformin and diet focus at this time.  Check BS daily.  Return in 3 months.      Relevant Medications   metFORMIN (GLUCOPHAGE) 500 MG tablet   benazepril (LOTENSIN) 40 MG tablet   atorvastatin (LIPITOR) 40 MG tablet   Other Relevant Orders   Bayer DCA Hb A1c Waived   Microalbumin, Urine Waived here   Referral to Chronic Care Management Services   Hyperlipidemia associated with type 2 diabetes mellitus (HCC)    Chronic, ongoing.  Continue current medication regimen and adjust as needed to meet neuro goal of LDL <70.  Lipid panel and CMP today.        Relevant Medications   metFORMIN (GLUCOPHAGE) 500 MG tablet   benazepril (LOTENSIN) 40 MG tablet   atorvastatin (LIPITOR) 40 MG tablet   Other Relevant Orders   Bayer DCA Hb A1c Waived   CBC with Differential/Platelet out   Comprehensive metabolic panel   Lipid Panel w/o Chol/HDL Ratio out     Other   History of prostate cancer    Obtain PSA today, recommend he schedule f/u with Dr. Bernardo Heater for ongoing assessment.       Other Visit Diagnoses    Annual physical exam    -  Primary   Annual labs obtained   Relevant Orders   TSH   PSA   B12 deficiency       History of low level reported, check today.   Relevant Orders   Vitamin B12     Discussed aspirin prophylaxis for myocardial infarction prevention and decision was made to continue ASA  LABORATORY TESTING:  Health maintenance labs ordered today as discussed above.   The natural history of prostate cancer and ongoing controversy regarding screening and potential treatment outcomes of prostate cancer has been discussed with the patient. The meaning of a false positive PSA and a false negative PSA has been discussed. He indicates understanding of the limitations of this screening test and wishes to proceed with screening PSA testing.   IMMUNIZATIONS:   - Tdap: Tetanus vaccination status reviewed: last tetanus booster within 10 years. - Influenza: Up to date -  Pneumovax: Refused - Prevnar: Refused - Zostavax vaccine: has had one shot  SCREENING: - Colonoscopy: Up to date  Discussed with patient purpose of the colonoscopy is to detect colon cancer at curable precancerous or early stages   - AAA Screening: Not applicable  -Hearing Test: Not applicable  -Spirometry: Not applicable   PATIENT COUNSELING:    Sexuality: Discussed sexually transmitted diseases, partner selection, use of condoms, avoidance of unintended pregnancy  and contraceptive alternatives.   Advised to avoid cigarette smoking.  I discussed with the patient that most people either abstain from alcohol or drink within safe limits (<=14/week and <=4 drinks/occasion for males, <=7/weeks and <= 3 drinks/occasion for females) and that the risk for alcohol disorders and other health effects rises proportionally with the number of drinks per week and how often a drinker exceeds daily limits.  Discussed cessation/primary prevention of drug use and availability of treatment for abuse.   Diet: Encouraged to adjust caloric intake to maintain  or achieve ideal body weight, to reduce intake of dietary saturated fat and total fat, to limit sodium intake by avoiding high sodium foods and not adding table salt, and to maintain adequate dietary potassium and calcium preferably from fresh fruits,  vegetables, and low-fat dairy products.    stressed the importance of regular exercise  Injury prevention: Discussed safety belts, safety helmets, smoke detector, smoking near bedding or upholstery.   Dental health: Discussed importance of regular tooth brushing, flossing, and dental visits.   Follow up plan: NEXT PREVENTATIVE PHYSICAL DUE IN 1 YEAR. Return in about 3 months (around 09/19/2019) for T2DM, HTN/HLD.

## 2019-06-21 NOTE — Patient Instructions (Signed)

## 2019-06-22 ENCOUNTER — Other Ambulatory Visit: Payer: Self-pay

## 2019-06-22 ENCOUNTER — Telehealth: Payer: Self-pay | Admitting: Family Medicine

## 2019-06-22 ENCOUNTER — Encounter: Payer: PPO | Admitting: Speech Pathology

## 2019-06-22 LAB — TSH: TSH: 1.24 u[IU]/mL (ref 0.450–4.500)

## 2019-06-22 LAB — COMPREHENSIVE METABOLIC PANEL
ALT: 32 IU/L (ref 0–44)
AST: 29 IU/L (ref 0–40)
Albumin/Globulin Ratio: 2.3 — ABNORMAL HIGH (ref 1.2–2.2)
Albumin: 4.6 g/dL (ref 3.8–4.8)
Alkaline Phosphatase: 76 IU/L (ref 39–117)
BUN/Creatinine Ratio: 20 (ref 10–24)
BUN: 21 mg/dL (ref 8–27)
Bilirubin Total: 0.6 mg/dL (ref 0.0–1.2)
CO2: 21 mmol/L (ref 20–29)
Calcium: 9.2 mg/dL (ref 8.6–10.2)
Chloride: 102 mmol/L (ref 96–106)
Creatinine, Ser: 1.05 mg/dL (ref 0.76–1.27)
GFR calc Af Amer: 84 mL/min/{1.73_m2} (ref >59)
GFR calc non Af Amer: 73 mL/min/{1.73_m2} (ref >59)
Globulin, Total: 2 g/dL (ref 1.5–4.5)
Glucose: 147 mg/dL — ABNORMAL HIGH (ref 65–99)
Potassium: 4.4 mmol/L (ref 3.5–5.2)
Sodium: 139 mmol/L (ref 134–144)
Total Protein: 6.6 g/dL (ref 6.0–8.5)

## 2019-06-22 LAB — CBC WITH DIFFERENTIAL/PLATELET
Basophils Absolute: 0 10*3/uL (ref 0.0–0.2)
Basos: 1 %
EOS (ABSOLUTE): 0.2 10*3/uL (ref 0.0–0.4)
Eos: 3 %
Hematocrit: 42.1 % (ref 37.5–51.0)
Hemoglobin: 14 g/dL (ref 13.0–17.7)
Immature Grans (Abs): 0 10*3/uL (ref 0.0–0.1)
Immature Granulocytes: 0 %
Lymphocytes Absolute: 1.3 10*3/uL (ref 0.7–3.1)
Lymphs: 24 %
MCH: 31.8 pg (ref 26.6–33.0)
MCHC: 33.3 g/dL (ref 31.5–35.7)
MCV: 96 fL (ref 79–97)
Monocytes Absolute: 0.5 10*3/uL (ref 0.1–0.9)
Monocytes: 9 %
Neutrophils Absolute: 3.6 10*3/uL (ref 1.4–7.0)
Neutrophils: 63 %
Platelets: 156 10*3/uL (ref 150–450)
RBC: 4.4 x10E6/uL (ref 4.14–5.80)
RDW: 11.4 % — ABNORMAL LOW (ref 11.6–15.4)
WBC: 5.6 10*3/uL (ref 3.4–10.8)

## 2019-06-22 LAB — LIPID PANEL W/O CHOL/HDL RATIO
Cholesterol, Total: 130 mg/dL (ref 100–199)
HDL: 59 mg/dL (ref >39)
LDL Chol Calc (NIH): 54 mg/dL (ref 0–99)
Triglycerides: 90 mg/dL (ref 0–149)
VLDL Cholesterol Cal: 17 mg/dL (ref 5–40)

## 2019-06-22 LAB — PSA: Prostate Specific Ag, Serum: 7 ng/mL — ABNORMAL HIGH (ref 0.0–4.0)

## 2019-06-22 LAB — VITAMIN B12: Vitamin B-12: 534 pg/mL (ref 232–1245)

## 2019-06-22 NOTE — Telephone Encounter (Signed)
Done

## 2019-06-22 NOTE — Telephone Encounter (Signed)
Patient requesting PSA from yesterday be faxed to [facility].    Fax# [phone]

## 2019-06-23 ENCOUNTER — Ambulatory Visit (INDEPENDENT_AMBULATORY_CARE_PROVIDER_SITE_OTHER): Payer: PPO | Admitting: *Deleted

## 2019-06-23 DIAGNOSIS — I639 Cerebral infarction, unspecified: Secondary | ICD-10-CM | POA: Diagnosis not present

## 2019-06-23 LAB — CUP PACEART REMOTE DEVICE CHECK
Date Time Interrogation Session: 20201217151935
Implantable Pulse Generator Implant Date: 20200316

## 2019-06-29 ENCOUNTER — Encounter: Payer: PPO | Admitting: Speech Pathology

## 2019-07-04 ENCOUNTER — Encounter: Payer: PPO | Admitting: Speech Pathology

## 2019-07-06 ENCOUNTER — Telehealth: Payer: PPO

## 2019-07-06 ENCOUNTER — Encounter: Payer: PPO | Admitting: Speech Pathology

## 2019-07-14 ENCOUNTER — Ambulatory Visit: Payer: PPO | Attending: Internal Medicine

## 2019-07-14 DIAGNOSIS — Z20822 Contact with and (suspected) exposure to covid-19: Secondary | ICD-10-CM | POA: Diagnosis not present

## 2019-07-16 LAB — NOVEL CORONAVIRUS, NAA: SARS-CoV-2, NAA: NOT DETECTED

## 2019-07-18 ENCOUNTER — Ambulatory Visit: Payer: PPO

## 2019-07-18 ENCOUNTER — Other Ambulatory Visit: Payer: Self-pay

## 2019-07-18 ENCOUNTER — Ambulatory Visit (INDEPENDENT_AMBULATORY_CARE_PROVIDER_SITE_OTHER): Payer: PPO

## 2019-07-18 DIAGNOSIS — Z0000 Encounter for general adult medical examination without abnormal findings: Secondary | ICD-10-CM | POA: Diagnosis not present

## 2019-07-18 MED ORDER — VALACYCLOVIR HCL 500 MG PO TABS: 500.0000 mg | ORAL_TABLET | Freq: Every day | ORAL | 2 refills | Status: DC

## 2019-07-18 NOTE — Patient Instructions (Signed)
Andrew Cobb , Thank you for taking time to come for your Medicare Wellness Visit. I appreciate your ongoing commitment to your health goals. Please review the following plan we discussed and let me know if I can assist you in the future.   Screening recommendations/referrals: Colonoscopy: completed 2014 Recommended yearly ophthalmology/optometry visit for glaucoma screening and checkup Recommended yearly dental visit for hygiene and checkup  Vaccinations: Influenza vaccine: up to date  Pneumococcal vaccine: due now  Tdap vaccine: up to date  Shingles vaccine: shingrix eligible     Advanced directives: Please bring a copy of your health care power of attorney and living will to the office at your convenience.  Conditions/risks identified: diabetic   Next appointment: follow up in one year for your annual wellness visit  Preventive Care 65 Years and Older, Male Preventive care refers to lifestyle choices and visits with your health care provider that can promote health and wellness. What does preventive care include?  A yearly physical exam. This is also called an annual well check.  Dental exams once or twice a year.  Routine eye exams. Ask your health care provider how often you should have your eyes checked.  Personal lifestyle choices, including:  Daily care of your teeth and gums.  Regular physical activity.  Eating a healthy diet.  Avoiding tobacco and drug use.  Limiting alcohol use.  Practicing safe sex.  Taking low doses of aspirin every day.  Taking vitamin and mineral supplements as recommended by your health care provider. What happens during an annual well check? The services and screenings done by your health care provider during your annual well check will depend on your age, overall health, lifestyle risk factors, and family history of disease. Counseling  Your health care provider may ask you questions about your:  Alcohol use.  Tobacco  use.  Drug use.  Emotional well-being.  Home and relationship well-being.  Sexual activity.  Eating habits.  History of falls.  Memory and ability to understand (cognition).  Work and work Statistician. Screening  You may have the following tests or measurements:  Height, weight, and BMI.  Blood pressure.  Lipid and cholesterol levels. These may be checked every 5 years, or more frequently if you are over 52 years old.  Skin check.  Lung cancer screening. You may have this screening every year starting at age 38 if you have a 30-pack-year history of smoking and currently smoke or have quit within the past 15 years.  Fecal occult blood test (FOBT) of the stool. You may have this test every year starting at age 60.  Flexible sigmoidoscopy or colonoscopy. You may have a sigmoidoscopy every 5 years or a colonoscopy every 10 years starting at age 35.  Prostate cancer screening. Recommendations will vary depending on your family history and other risks.  Hepatitis C blood test.  Hepatitis B blood test.  Sexually transmitted disease (STD) testing.  Diabetes screening. This is done by checking your blood sugar (glucose) after you have not eaten for a while (fasting). You may have this done every 1-3 years.  Abdominal aortic aneurysm (AAA) screening. You may need this if you are a current or former smoker.  Osteoporosis. You may be screened starting at age 68 if you are at high risk. Talk with your health care provider about your test results, treatment options, and if necessary, the need for more tests. Vaccines  Your health care provider may recommend certain vaccines, such as:  Influenza vaccine. This  is recommended every year.  Tetanus, diphtheria, and acellular pertussis (Tdap, Td) vaccine. You may need a Td booster every 10 years.  Zoster vaccine. You may need this after age 32.  Pneumococcal 13-valent conjugate (PCV13) vaccine. One dose is recommended after age  58.  Pneumococcal polysaccharide (PPSV23) vaccine. One dose is recommended after age 15. Talk to your health care provider about which screenings and vaccines you need and how often you need them. This information is not intended to replace advice given to you by your health care provider. Make sure you discuss any questions you have with your health care provider. Document Released: 07/20/2015 Document Revised: 03/12/2016 Document Reviewed: 04/24/2015 Elsevier Interactive Patient Education  2017 [place] Prevention in the Home Falls can cause injuries. They can happen to people of all ages. There are many things you can do to make your home safe and to help prevent falls. What can I do on the outside of my home?  Regularly fix the edges of walkways and driveways and fix any cracks.  Remove anything that might make you trip as you walk through a door, such as a raised step or threshold.  Trim any bushes or trees on the path to your home.  Use bright outdoor lighting.  Clear any walking paths of anything that might make someone trip, such as rocks or tools.  Regularly check to see if handrails are loose or broken. Make sure that both sides of any steps have handrails.  Any raised decks and porches should have guardrails on the edges.  Have any leaves, snow, or ice cleared regularly.  Use sand or salt on walking paths during winter.  Clean up any spills in your garage right away. This includes oil or grease spills. What can I do in the bathroom?  Use night lights.  Install grab bars by the toilet and in the tub and shower. Do not use towel bars as grab bars.  Use non-skid mats or decals in the tub or shower.  If you need to sit down in the shower, use a plastic, non-slip stool.  Keep the floor dry. Clean up any water that spills on the floor as soon as it happens.  Remove soap buildup in the tub or shower regularly.  Attach bath mats securely with double-sided  non-slip rug tape.  Do not have throw rugs and other things on the floor that can make you trip. What can I do in the bedroom?  Use night lights.  Make sure that you have a light by your bed that is easy to reach.  Do not use any sheets or blankets that are too big for your bed. They should not hang down onto the floor.  Have a firm chair that has side arms. You can use this for support while you get dressed.  Do not have throw rugs and other things on the floor that can make you trip. What can I do in the kitchen?  Clean up any spills right away.  Avoid walking on wet floors.  Keep items that you use a lot in easy-to-reach places.  If you need to reach something above you, use a strong step stool that has a grab bar.  Keep electrical cords out of the way.  Do not use floor polish or wax that makes floors slippery. If you must use wax, use non-skid floor wax.  Do not have throw rugs and other things on the floor that can make you  trip. What can I do with my stairs?  Do not leave any items on the stairs.  Make sure that there are handrails on both sides of the stairs and use them. Fix handrails that are broken or loose. Make sure that handrails are as long as the stairways.  Check any carpeting to make sure that it is firmly attached to the stairs. Fix any carpet that is loose or worn.  Avoid having throw rugs at the top or bottom of the stairs. If you do have throw rugs, attach them to the floor with carpet tape.  Make sure that you have a light switch at the top of the stairs and the bottom of the stairs. If you do not have them, ask someone to add them for you. What else can I do to help prevent falls?  Wear shoes that:  Do not have high heels.  Have rubber bottoms.  Are comfortable and fit you well.  Are closed at the toe. Do not wear sandals.  If you use a stepladder:  Make sure that it is fully opened. Do not climb a closed stepladder.  Make sure that both  sides of the stepladder are locked into place.  Ask someone to hold it for you, if possible.  Clearly mark and make sure that you can see:  Any grab bars or handrails.  First and last steps.  Where the edge of each step is.  Use tools that help you move around (mobility aids) if they are needed. These include:  Canes.  Walkers.  Scooters.  Crutches.  Turn on the lights when you go into a dark area. Replace any light bulbs as soon as they burn out.  Set up your furniture so you have a clear path. Avoid moving your furniture around.  If any of your floors are uneven, fix them.  If there are any pets around you, be aware of where they are.  Review your medicines with your doctor. Some medicines can make you feel dizzy. This can increase your chance of falling. Ask your doctor what other things that you can do to help prevent falls. This information is not intended to replace advice given to you by your health care provider. Make sure you discuss any questions you have with your health care provider. Document Released: 04/19/2009 Document Revised: 11/29/2015 Document Reviewed: 07/28/2014 Elsevier Interactive Patient Education  2017 [employer].

## 2019-07-18 NOTE — Telephone Encounter (Signed)
Patient last seen 06/21/19 and has appointment 09/19/19

## 2019-07-18 NOTE — Progress Notes (Signed)
Subjective:   Andrew Cobb is a 69 y.o. male who presents for Medicare Annual/Subsequent preventive examination.  This visit is being conducted via phone call  - after an attmept to do on video chat - due to the COVID-19 pandemic. This patient has given me verbal consent via phone to conduct this visit, patient states they are participating from their home address. Some vital signs may be absent or patient reported.   Patient identification: identified by name, DOB, and current address.    Review of Systems:   Cardiac Risk Factors include: advanced age (>46men, >36 women);male gender;dyslipidemia;hypertension     Objective:    Vitals: There were no vitals taken for this visit.  There is no height or weight on file to calculate BMI.  Advanced Directives 07/18/2019 03/30/2019 02/09/2019 01/26/2019 01/17/2019 01/17/2019 12/31/2018  Does Patient Have a Medical Advance Directive? Yes Yes Yes Yes Yes No No  Type of Advance Directive Living will;Healthcare Power of [place];Living will - -  Does patient want to make changes to medical advance directive? - - No - Patient declined - No - Patient declined - -  Copy of [place] in Chart? No - copy requested - No - copy requested - No - copy requested - -  Would patient like information on creating a medical advance directive? - - No - Patient declined - No - Patient declined No - Patient declined No - Patient declined    Tobacco Social History   Tobacco Use  Smoking Status Former Smoker  . Packs/day: 1.00  . Years: 10.00  . Pack years: 10.00  . Types: Cigarettes  . Quit date: 07/07/1984  . Years since quitting: 35.0  Smokeless Tobacco Never Used  Tobacco Comment   quit 1986     Counseling given: Not Answered Comment: quit 1986   Clinical Intake:  Pre-visit preparation completed: Yes  Pain : 0-10 Pain Score: 5  Pain Type: Chronic pain Pain Location:  Generalized Pain Descriptors / Indicators: Aching Pain Onset: More than a month ago Pain Frequency: Constant     Diabetes: No  How often do you need to have someone help you when you read instructions, pamphlets, or other written materials from your doctor or pharmacy?: 1 - Never  Interpreter Needed?: No  Information entered by :: Lycia Sachdeva,LPN  Past Medical History:  Diagnosis Date  . Arthritis   . Asthma   . Cancer [facility])    prostate  . Chronic venous insufficiency    varicose vein lower extremity with inflammation  . Coronary artery disease 1996   two stents placed   . Diabetes mellitus without complication ([place])    type 2 on metformin  . GERD (gastroesophageal reflux disease)    no issues since gastric bypass surgery as stated per pt  . Hyperlipidemia   . Hypogonadism in male   . MRSA (methicillin resistant Staphylococcus aureus) infection    07/30/2008 thru 08/07/2008  . Sleep apnea    on BIPAP  . Stented coronary artery   . Stroke ([place])   . Thrombocythemia [facility])    Past Surgical History:  Procedure Laterality Date  . ANGIOPLASTY    . ANGIOPLASTY     with stent 04/07/1995  . ANTERIOR CERVICAL DECOMPRESSION/DISCECTOMY FUSION 4 LEVELS N/A 08/17/2017   Procedure: Anterior discectomy with fusion and plate fixation Cervical Three-Four, Four-Five, Five-Six, and Six-Seven Fusion;  Surgeon: Ditty, Kevan Ny, MD;  Location: [place] OR;  Service: Neurosurgery;  Laterality: N/A;  Anterior discectomy with fusion and plate fixation Cervical Three-Four, Four-Five, Five-Six, and Six-Seven Fusion   . APPENDECTOMY     1966  . BUBBLE STUDY  09/20/2018   Procedure: BUBBLE STUDY;  Surgeon: Josue Hector, MD;  Location: [place];  Service: Cardiovascular;;  . [place]  . CARDIOVASCULAR STRESS TEST     07/31/2011  . CARPAL TUNNEL RELEASE Left   . CHOLECYSTECTOMY     2006  . colonscopy      08/25/2012  . EYE SURGERY Bilateral    cataract  . FUNCTIONAL  ENDOSCOPIC SINUS SURGERY     11/10/2013  . GASTRIC BYPASS     10/05/2012  . HERNIA REPAIR     left inguinal 1981  . INCISION AND DRAINAGE ABSCESS Right 02/26/2017   Procedure: INCISION AND DRAINAGE ABSCESS;  Surgeon: Nickie Retort, MD;  Location: [facility] ORS;  Service: Urology;  Laterality: Right;  . JOINT REPLACEMENT     bilateral  . left ankle surgery      05/03/2003   . left carpel tunnel      09/18/1993  . left knee meniscal tear      01/25/2010  . left knee meniscal tear repair      05/04/1996  . left rotator cuff repair      05/03/2003   . LOOP RECORDER INSERTION N/A 09/20/2018   Procedure: LOOP RECORDER INSERTION;  Surgeon: Evans Lance, MD;  Location: [place] CV LAB;  Service: Cardiovascular;  Laterality: N/A;  . REPLACEMENT TOTAL KNEE BILATERAL  07/13/2015  . right ankle surgery      fracture has 2 screws 07/07/1997  . right carpel tunnel      05/16/1992  . right shoulder replacement      01/27/2006  . SCROTAL EXPLORATION Right 02/26/2017   Procedure: SCROTUM EXPLORATION;  Surgeon: Nickie Retort, MD;  Location: [facility] ORS;  Service: Urology;  Laterality: Right;  . TEE WITHOUT CARDIOVERSION N/A 09/20/2018   Procedure: TRANSESOPHAGEAL ECHOCARDIOGRAM (TEE);  Surgeon: Josue Hector, MD;  Location: [facility];  Service: Cardiovascular;  Laterality: N/A;  . TOTAL KNEE ARTHROPLASTY Left 07/13/2015   Procedure: LEFT TOTAL KNEE ARTHROPLASTY;  Surgeon: Gaynelle Arabian, MD;  Location: [facility];  Service: Orthopedics;  Laterality: Left;   Family History  Problem Relation Age of Onset  . Cancer Mother        pancreatic  . Diabetes Mother   . Stroke Mother   . Heart disease Mother   . Hyperlipidemia Mother   . Hypertension Mother   . Heart attack Mother   . Heart disease Father   . Stroke Father   . Diabetes Father   . Hypertension Father   . Heart attack Father   . Hyperlipidemia Father   . Pancreatic cancer Father   . Cancer Sister   . Cancer Brother        lung    . Cancer Brother   . Kidney cancer Neg Hx   . Bladder Cancer Neg Hx   . Prostate cancer Neg Hx    Social History   Socioeconomic History  . Marital status: Married    Spouse name: Not on file  . Number of children: Not on file  . Years of education: Not on file  . Highest education level: Some college, no degree  Occupational History    Comment: drives for [employer]   Tobacco Use  . Smoking  status: Former Smoker    Packs/day: 1.00    Years: 10.00    Pack years: 10.00    Types: Cigarettes    Quit date: 07/07/1984    Years since quitting: 35.0  . Smokeless tobacco: Never Used  . Tobacco comment: quit 1986  Substance and Sexual Activity  . Alcohol use: No    Alcohol/week: 0.0 standard drinks  . Drug use: No  . Sexual activity: Yes  Other Topics Concern  . Not on file  Social History Narrative  . Not on file   Social Determinants of Health   Financial Resource Strain:   . Difficulty of Paying Living Expenses: Not on file  Food Insecurity:   . Worried About Charity fundraiser in the Last Year: Not on file  . Ran Out of Food in the Last Year: Not on file  Transportation Needs:   . Lack of Transportation (Medical): Not on file  . Lack of Transportation (Non-Medical): Not on file  Physical Activity:   . Days of Exercise per Week: Not on file  . Minutes of Exercise per Session: Not on file  Stress:   . Feeling of Stress : Not on file  Social Connections:   . Frequency of Communication with Friends and Family: Not on file  . Frequency of Social Gatherings with Friends and Family: Not on file  . Attends Religious Services: Not on file  . Active Member of Clubs or Organizations: Not on file  . Attends Archivist Meetings: Not on file  . Marital Status: Not on file    Outpatient Encounter Medications as of 07/18/2019  Medication Sig  . amitriptyline (ELAVIL) 25 MG tablet Take 1 tablet (25 mg total) by mouth at bedtime.  [name] Kitchen aspirin EC 81 MG EC tablet Take 1  tablet (81 mg total) by mouth daily.  [name] Kitchen atorvastatin (LIPITOR) 40 MG tablet Take 1 tablet (40 mg total) by mouth at bedtime.  . benazepril (LOTENSIN) 40 MG tablet Take 1 tablet (40 mg total) by mouth daily.  . cetirizine (ZYRTEC) 10 MG tablet Take 10 mg by mouth daily.  . Cholecalciferol (VITAMIN D) 2000 units CAPS Take 2,000 Units by mouth daily.  [name] Kitchen glucose blood test strip 1 each by Other route 2 (two) times daily. DX E11.9  . Lancets (ONETOUCH ULTRASOFT) lancets 1 each by Other route 2 (two) times daily. Dx E11.9  . Magnesium 250 MG TABS Take 250 mg by mouth at bedtime.   . metFORMIN (GLUCOPHAGE) 500 MG tablet Take 2 tablets (1,000 mg total) by mouth 2 (two) times daily with a meal. (Patient taking differently: Take 1,000 mg by mouth 2 (two) times daily with a meal. 1 tab twice a day)  . Multiple Vitamins-Minerals (MULTIVITAMIN PO) Take 1 tablet by mouth daily.   . valACYclovir (VALTREX) 500 MG tablet Take 1 tablet (500 mg total) by mouth daily. (Patient taking differently: Take 500 mg by mouth at bedtime. )   No facility-administered encounter medications on file as of 07/18/2019.    Activities of Daily Living In your present state of health, do you have any difficulty performing the following activities: 07/18/2019 06/21/2019  Hearing? N N  Comment hearing aids bilateral -  Vision? N N  Comment reading glasses occasional, dr. Katy Fitch in [place] -  Difficulty concentrating or making decisions? N N  Walking or climbing stairs? N N  Dressing or bathing? N N  Doing errands, shopping? N N  Preparing Food and eating ?  N -  Using the Toilet? N -  In the past six months, have you accidently leaked urine? N -  Do you have problems with loss of bowel control? N -  Managing your Medications? N -  Managing your Finances? N -  Housekeeping or managing your Housekeeping? N -  Some recent data might be hidden    Patient Care Team: Guadalupe Maple, MD as PCP - General (Family  Medicine) De Hollingshead, [facility] as Pharmacist (Pharmacist)   Assessment:   This is a routine wellness examination for Felimon.  Exercise Activities and Dietary recommendations Current Exercise Habits: Home exercise routine, Type of exercise: walking, Time (Minutes): 35, Frequency (Times/Week): 7, Weekly Exercise (Minutes/Week): 245  Goals    . "I want to work on my diabetes" (pt-stated)     Current Barriers:  . Diabetes: uncontrolled, most recent A1c 7.7% ; goal <6.5% per neurology  o Admitted 8/4-02/10/2019 for stroke-like symptoms, new MCA branch infarct, per neurology this is 4th stroke in 2020 o Patient notes he established with speech therapy  . Current antihyperglycemic regimen: metformin 1000 mg BID (recently increased) o Denies any GI upset w/ dose increase . Current exercise: Notes that he walks 1.5 miles QAM  . Current blood glucose readings: Reports fastings in 130s . Cardiovascular risk reduction: o Current hypertensive regimen: benazepril 40 mg daily, BP well controlled at home 120s/70s o Current hyperlipidemia regimen: atorvastatin 80 mg daily, last LDL very well controlled <70; confirmed that patient is taking ASA 81 mg daily and clopidogrel was d/c d/t recent hemorrhagic stroke  Pharmacist Clinical Goal(s):  [name] Kitchen Over the next 90 days, patient with work with PharmD and primary care provider to address optimized hyperglycemic management  Interventions: . Comprehensive medication review performed. Medication list updated in electronic health record.  . Congratulated patient on improvement in blood sugar readings w/ increased metformin dose. Recommend A1c in November; if not at goal on max metformin dose, recommend addition of SGLT2 d/t CV risk reduction benefit.  . Encouraged continued medication adherence. Per outside med review, patient is up to date on fills for metformin, atorvastatin, and benzepril.   Patient Self Care Activities:  . Patient will check blood glucose  daily, document, and provide at future appointments . Patient will take medications as prescribed . Patient will report any questions or concerns to provider   Please see past updates related to this goal by clicking on the "Past Updates" button in the selected goal      . DIET - INCREASE WATER INTAKE     Recommend drinking at least 6-8 glasses of water a day        Fall Risk: Fall Risk  07/18/2019 06/21/2019 08/09/2018 04/22/2018 07/21/2017  Falls in the past year? 1 0 0 No No  Number falls in past yr: 0 0 0 - -  Injury with Fall? 0 0 0 - -  Follow up - Falls evaluation completed - - -    FALL RISK PREVENTION PERTAINING TO THE HOME:  Any stairs in or around the home? Yes  If so, are there any without handrails? No   Home free of loose throw rugs in walkways, pet beds, electrical cords, etc? Yes  Adequate lighting in your home to reduce risk of falls? Yes   ASSISTIVE DEVICES UTILIZED TO PREVENT FALLS:  Life alert? No  Use of a cane, walker or w/c? No  Grab bars in the bathroom? Yes  Shower chair or bench in shower?  No  Elevated toilet seat or a handicapped toilet? Yes   TIMED UP AND GO:  Unable to perform   Depression Screen PHQ 2/9 Scores 07/18/2019 06/21/2019 10/13/2018 04/22/2018  PHQ - 2 Score 0 0 0 0    Cognitive Function     6CIT Screen 04/22/2018 04/20/2017  What Year? 0 points 0 points  What month? 0 points 0 points  What time? 0 points 0 points  Count back from 20 0 points 0 points  Months in reverse 0 points -  Repeat phrase 0 points 0 points  Total Score 0 -    Immunization History  Administered Date(s) Administered  . Fluad Quad(high Dose 65+) 03/16/2019  . Influenza Whole 04/09/2009  . Influenza, High Dose Seasonal PF 04/06/2017, 04/22/2018  . Influenza,inj,Quad PF,6+ Mos 04/24/2015  . Influenza-Unspecified 03/07/2014, 04/06/2017  . Td 03/12/2005  . Tdap 07/17/2016  . Zoster 07/17/2016    Qualifies for Shingles Vaccine? Yes  Zostavax  completed n/a. Due for Shingrix. Education has been provided regarding the importance of this vaccine. Pt has been advised to call insurance company to determine out of pocket expense. Advised may also receive vaccine at local pharmacy or Health Dept. Verbalized acceptance and understanding.  Tdap: up to date   Flu Vaccine: up to date   Pneumococcal Vaccine: due now   Screening Tests Health Maintenance  Topic Date Due  . OPHTHALMOLOGY EXAM  07/06/2019  . PNA vac Low Risk Adult (1 of 2 - PCV13) 06/20/2020 (Originally 01/21/2016)  . HEMOGLOBIN A1C  12/20/2019  . FOOT EXAM  06/20/2020  . COLONOSCOPY  08/24/2022  . TETANUS/TDAP  07/17/2026  . INFLUENZA VACCINE  Completed  . Hepatitis C Screening  Completed   Cancer Screenings:  Colorectal Screening: Completed 08/24/2012. Repeat every 10 years  Lung Cancer Screening: (Low Dose CT Chest recommended if Age 34-80 years, 30 pack-year currently smoking OR have quit w/in 15years.) does not qualify.     Additional Screening:  Hepatitis C Screening: does qualify; Completed 2018  Vision Screening: Recommended annual ophthalmology exams for early detection of glaucoma and other disorders of the eye. Is the patient up to date with their annual eye exam?  Yes  Who is the provider or what is the name of the office in which the pt attends annual eye exams? Dr. Katy Fitch   Dental Screening: Recommended annual dental exams for proper oral hygiene  Community Resource Referral:  CRR required this visit?  No        Plan:  I have personally reviewed and addressed the Medicare Annual Wellness questionnaire and have noted the following in the patient's chart:  A. Medical and social history B. Use of alcohol, tobacco or illicit drugs  C. Current medications and supplements D. Functional ability and status E.  Nutritional status F.  Physical activity G. Advance directives H. List of other physicians I.  Hospitalizations, surgeries, and ER visits in  previous 12 months J.  [facility] such as hearing and vision if needed, cognitive and depression L. Referrals and appointments   In addition, I have reviewed and discussed with patient certain preventive protocols, quality metrics, and best practice recommendations. A written personalized care plan for preventive services as well as general preventive health recommendations were provided to patient.   Signed,   Bevelyn Ngo, LPN  [id number] Nurse Health Advisor   Nurse Notes: none

## 2019-07-19 ENCOUNTER — Ambulatory Visit (INDEPENDENT_AMBULATORY_CARE_PROVIDER_SITE_OTHER): Payer: PPO | Admitting: Pharmacist

## 2019-07-19 DIAGNOSIS — I2510 Atherosclerotic heart disease of native coronary artery without angina pectoris: Secondary | ICD-10-CM | POA: Diagnosis not present

## 2019-07-19 DIAGNOSIS — E1165 Type 2 diabetes mellitus with hyperglycemia: Secondary | ICD-10-CM

## 2019-07-19 NOTE — Chronic Care Management (AMB) (Signed)
Chronic Care Management   Follow Up Note   07/19/2019 Name: Andrew Cobb MRN: [id number] DOB: 12-Apr-1951  Referred by: Guadalupe Maple, MD Reason for referral : Chronic Care Management (Medication Management)   Andrew Cobb is a 69 y.o. year old male who is a primary care patient of Crissman, Jeannette How, MD. The CCM team was consulted for assistance with chronic disease management and care coordination needs.    Contacted patient for medication management.   Review of patient status, including review of consultants reports, relevant laboratory and other test results, and collaboration with appropriate care team members and the patient's provider was performed as part of comprehensive patient evaluation and provision of chronic care management services.    SDOH (Social Determinants of Health) screening performed today: Financial Strain . See Care Plan for related entries.   Outpatient Encounter Medications as of 07/19/2019  Medication Sig  . amitriptyline (ELAVIL) 25 MG tablet Take 1 tablet (25 mg total) by mouth at bedtime.  [name] Kitchen aspirin EC 81 MG EC tablet Take 1 tablet (81 mg total) by mouth daily.  [name] Kitchen atorvastatin (LIPITOR) 40 MG tablet Take 1 tablet (40 mg total) by mouth at bedtime.  . benazepril (LOTENSIN) 40 MG tablet Take 1 tablet (40 mg total) by mouth daily.  . cetirizine (ZYRTEC) 10 MG tablet Take 10 mg by mouth daily.  . Cholecalciferol (VITAMIN D) 2000 units CAPS Take 2,000 Units by mouth daily.  [name] Kitchen glucose blood test strip 1 each by Other route 2 (two) times daily. DX E11.9  . Lancets (ONETOUCH ULTRASOFT) lancets 1 each by Other route 2 (two) times daily. Dx E11.9  . Magnesium 250 MG TABS Take 250 mg by mouth at bedtime.   . metFORMIN (GLUCOPHAGE) 500 MG tablet Take 2 tablets (1,000 mg total) by mouth 2 (two) times daily with a meal. (Patient taking differently: Take 1,000 mg by mouth 2 (two) times daily with a meal. 1 tab twice a day)  . Multiple Vitamins-Minerals  (MULTIVITAMIN PO) Take 1 tablet by mouth daily.   . valACYclovir (VALTREX) 500 MG tablet Take 1 tablet (500 mg total) by mouth daily.   No facility-administered encounter medications on file as of 07/19/2019.     Goals Addressed            This Visit's Progress     Patient Stated   . "I want to work on my diabetes" (pt-stated)       Current Barriers:  . Diabetes: uncontrolled, most recent A1c 7.1% ; goal <6.5% per neurology o Previously discussed option for addition of SGLT2.  . Current antihyperglycemic regimen: metformin 1000 mg BID . Current blood glucose readings: Reports fastings in 130s . Cardiovascular risk reduction (post 3 CVA in past year, most recent admission 02/2019 o Current hypertensive regimen: benazepril 40 mg daily, BP well controlled at home 120s/70s o Current hyperlipidemia regimen: atorvastatin 80 mg daily, last LDL very well controlled <70; ASA 81 mg daily (avoiding clopidogrel d/t recent hemorrhagic stroke)  Pharmacist Clinical Goal(s):  [name] Kitchen Over the next 90 days, patient with work with PharmD and primary care provider to address optimized hyperglycemic management  Interventions: . Recommend addition of Jardiance. Upon formulary review, patient notes that he is concerned about his ability to afford $45/month. Reviewed income; he and his wife provide financial support to 2 grandchildren. Appears he will qualify for Jardiance patient assistance. Will collaborate w/ CPhT to mail patient his portion of application. 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 Will collaborate w/ Marnee Guarneri, NP for provider signature.   Patient Self Care Activities:  . Patient will check blood glucose daily, document, and provide at future appointments . Patient will take medications as prescribed . Patient will report any questions or concerns to provider     Please see past updates related to this goal by clicking on the "Past Updates" button in the selected goal          Plan: - Will collaborate w/ patient, provider, and CPhT as above - Scheduled f/u call 08/24/19 @ 11:30 am  Catie Darnelle Maffucci, PharmD, [place] [phone]

## 2019-07-19 NOTE — Patient Instructions (Signed)
Visit Information  Goals Addressed            This Visit's Progress     Patient Stated   . "I want to work on my diabetes" (pt-stated)       Current Barriers:  . Diabetes: uncontrolled, most recent A1c 7.1% ; goal <6.5% per neurology o Previously discussed option for addition of SGLT2.  . Current antihyperglycemic regimen: metformin 1000 mg BID . Current blood glucose readings: Reports fastings in 130s . Cardiovascular risk reduction (post 3 CVA in past year, most recent admission 02/2019 o Current hypertensive regimen: benazepril 40 mg daily, BP well controlled at home 120s/70s o Current hyperlipidemia regimen: atorvastatin 80 mg daily, last LDL very well controlled <70; ASA 81 mg daily (avoiding clopidogrel d/t recent hemorrhagic stroke)  Pharmacist Clinical Goal(s):  [name] Kitchen Over the next 90 days, patient with work with PharmD and primary care provider to address optimized hyperglycemic management  Interventions: . Recommend addition of Jardiance. Upon formulary review, patient notes that he is concerned about his ability to afford $45/month. Reviewed income; he and his wife provide financial support to 2 grandchildren. Appears he will qualify for Jardiance patient assistance. Will collaborate w/ CPhT to mail patient his portion of application. 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 Will collaborate w/ Marnee Guarneri, NP for provider signature.  . Will follow for when he received medication from PAP, and will ensure f/u with PCP within 4-5 weeks after that   Patient Self Care Activities:  . Patient will check blood glucose daily, document, and provide at future appointments . Patient will take medications as prescribed . Patient will report any questions or concerns to provider   Please see past updates related to this goal by clicking on the "Past Updates"  button in the selected goal         The patient verbalized understanding of instructions provided today and declined a print copy of patient instruction materials.   Plan: - Will collaborate w/ patient, provider, and CPhT as above - Scheduled f/u call 08/24/19 @ 11:30 am  Catie Darnelle Maffucci, PharmD, [place] [phone]

## 2019-07-20 ENCOUNTER — Other Ambulatory Visit: Payer: Self-pay | Admitting: Nurse Practitioner

## 2019-07-20 MED ORDER — JARDIANCE 10 MG PO TABS: 10.0000 mg | ORAL_TABLET | Freq: Every day | ORAL | 3 refills | Status: DC

## 2019-07-21 ENCOUNTER — Other Ambulatory Visit: Payer: Self-pay | Admitting: Pharmacy Technician

## 2019-07-21 NOTE — Patient Outreach (Signed)
[facility]) Care Management  07/21/2019  Andrew Cobb Feb 21, 1951 [id number]                                       Medication Assistance Referral  Referral From: [facility] Embedded RPh Catie T.   Medication/Company: [name] / [employer] Patient application portion:  Mailed Provider application portion:  N/A Embedded pharmacist to have signed while in clinic to Marnee Guarneri, NP Provider address/fax verified via: Office website    Follow up:  Will follow up with patient in 5-10 business days to confirm application(s) have been received.  [name], [place] Management [phone]

## 2019-07-23 NOTE — Progress Notes (Signed)
ILR remote 

## 2019-07-26 ENCOUNTER — Ambulatory Visit (INDEPENDENT_AMBULATORY_CARE_PROVIDER_SITE_OTHER): Payer: PPO | Admitting: *Deleted

## 2019-07-26 DIAGNOSIS — I639 Cerebral infarction, unspecified: Secondary | ICD-10-CM | POA: Diagnosis not present

## 2019-07-27 ENCOUNTER — Ambulatory Visit: Payer: PPO | Admitting: Urology

## 2019-07-27 ENCOUNTER — Other Ambulatory Visit: Payer: Self-pay

## 2019-07-27 VITALS — BP 130/79 | HR 84 | Ht 70.0 in | Wt 200.0 lb

## 2019-07-27 DIAGNOSIS — C61 Malignant neoplasm of prostate: Secondary | ICD-10-CM

## 2019-07-27 LAB — CUP PACEART REMOTE DEVICE CHECK
Date Time Interrogation Session: 20210119152955
Implantable Pulse Generator Implant Date: 20200316

## 2019-07-28 ENCOUNTER — Encounter: Payer: Self-pay | Admitting: Urology

## 2019-07-28 NOTE — Progress Notes (Signed)
07/27/2019 8:03 AM   Andrew Cobb June 09, 1951 [id number]  Referring provider: Guadalupe Maple, MD No address on file  Chief Complaint  Patient presents with  . Elevated PSA    Urologic history: 1.  T1c very low risk prostate cancer -Diagnosed 11/2016; PSA 4.8; volume 61 cc -3 Cores Gleason 6 up to 18% of core LMM, LMA, LLM -Elected active surveillance -Confirmatory biopsy 02/2018; PSA 5.0; volume 78 cc -Pathology no cancer  2.  BPH with LUTS -Microwave thermotherapy Dr. Yves Dill 2008  3.  History of recurrent scrotal abscess  HPI: 69 y.o. male with very low risk prostate cancer on active surveillance.  He has not followed up since his confirmatory biopsy August 2019.  A PSA performed by his PCP December 2020 was 7.0.  He has stable lower urinary tract symptoms.  Denies dysuria or gross hematuria.   PMH: Past Medical History:  Diagnosis Date  . Arthritis   . Asthma   . Cancer [facility])    prostate  . Chronic venous insufficiency    varicose vein lower extremity with inflammation  . Coronary artery disease 1996   two stents placed   . Diabetes mellitus without complication ([place])    type 2 on metformin  . GERD (gastroesophageal reflux disease)    no issues since gastric bypass surgery as stated per pt  . Hyperlipidemia   . Hypogonadism in male   . MRSA (methicillin resistant Staphylococcus aureus) infection    07/30/2008 thru 08/07/2008  . Sleep apnea    on BIPAP  . Stented coronary artery   . Stroke ([place])   . Thrombocythemia [facility])     Surgical History: Past Surgical History:  Procedure Laterality Date  . ANGIOPLASTY    . ANGIOPLASTY     with stent 04/07/1995  . ANTERIOR CERVICAL DECOMPRESSION/DISCECTOMY FUSION 4 LEVELS N/A 08/17/2017   Procedure: Anterior discectomy with fusion and plate fixation Cervical Three-Four, Four-Five, Five-Six, and Six-Seven Fusion;  Surgeon: Ditty, Kevan Ny, MD;  Location: [facility];  Service: Neurosurgery;  Laterality: N/A;   Anterior discectomy with fusion and plate fixation Cervical Three-Four, Four-Five, Five-Six, and Six-Seven Fusion   . APPENDECTOMY     1966  . BUBBLE STUDY  09/20/2018   Procedure: BUBBLE STUDY;  Surgeon: Josue Hector, MD;  Location: [facility];  Service: Cardiovascular;;  . [place]  . CARDIOVASCULAR STRESS TEST     07/31/2011  . CARPAL TUNNEL RELEASE Left   . CHOLECYSTECTOMY     2006  . colonscopy      08/25/2012  . EYE SURGERY Bilateral    cataract  . FUNCTIONAL ENDOSCOPIC SINUS SURGERY     11/10/2013  . GASTRIC BYPASS     10/05/2012  . HERNIA REPAIR     left inguinal 1981  . INCISION AND DRAINAGE ABSCESS Right 02/26/2017   Procedure: INCISION AND DRAINAGE ABSCESS;  Surgeon: Nickie Retort, MD;  Location: [facility];  Service: Urology;  Laterality: Right;  . JOINT REPLACEMENT     bilateral  . left ankle surgery      05/03/2003   . left carpel tunnel      09/18/1993  . left knee meniscal tear      01/25/2010  . left knee meniscal tear repair      05/04/1996  . left rotator cuff repair      05/03/2003   . LOOP RECORDER INSERTION N/A 09/20/2018   Procedure: LOOP RECORDER INSERTION;  Surgeon: Evans Lance,  MD;  Location: [facility];  Service: Cardiovascular;  Laterality: N/A;  . REPLACEMENT TOTAL KNEE BILATERAL  07/13/2015  . right ankle surgery      fracture has 2 screws 07/07/1997  . right carpel tunnel      05/16/1992  . right shoulder replacement      01/27/2006  . SCROTAL EXPLORATION Right 02/26/2017   Procedure: SCROTUM EXPLORATION;  Surgeon: Nickie Retort, MD;  Location: [facility];  Service: Urology;  Laterality: Right;  . TEE WITHOUT CARDIOVERSION N/A 09/20/2018   Procedure: TRANSESOPHAGEAL ECHOCARDIOGRAM (TEE);  Surgeon: Josue Hector, MD;  Location: [facility] ENDOSCOPY;  Service: Cardiovascular;  Laterality: N/A;  . TOTAL KNEE ARTHROPLASTY Left 07/13/2015   Procedure: LEFT TOTAL KNEE ARTHROPLASTY;  Surgeon: Gaynelle Arabian, MD;   Location: [facility];  Service: Orthopedics;  Laterality: Left;    Home Medications:  Allergies as of 07/27/2019      Reactions   Succinylsulphathiazole Rash   Sulfamethoxazole-trimethoprim Rash   Tetracyclines & Related Rash      Medication List       Accurate as of July 27, 2019 11:59 PM. If you have any questions, ask your nurse or doctor.        amitriptyline 25 MG tablet Commonly known as: ELAVIL Take 1 tablet (25 mg total) by mouth at bedtime.   aspirin 81 MG EC tablet Take 1 tablet (81 mg total) by mouth daily.   atorvastatin 40 MG tablet Commonly known as: LIPITOR Take 1 tablet (40 mg total) by mouth at bedtime.   benazepril 40 MG tablet Commonly known as: LOTENSIN Take 1 tablet (40 mg total) by mouth daily.   cetirizine 10 MG tablet Commonly known as: ZYRTEC Take 10 mg by mouth daily.   glucose blood test strip 1 each by Other route 2 (two) times daily. DX E11.9   Jardiance 10 MG Tabs tablet Generic drug: empagliflozin Take 10 mg by mouth daily before breakfast.   Magnesium 250 MG Tabs Take 250 mg by mouth at bedtime.   metFORMIN 500 MG tablet Commonly known as: GLUCOPHAGE Take 2 tablets (1,000 mg total) by mouth 2 (two) times daily with a meal. What changed: additional instructions   MULTIVITAMIN PO Take 1 tablet by mouth daily.   onetouch ultrasoft lancets 1 each by Other route 2 (two) times daily. Dx E11.9   valACYclovir 500 MG tablet Commonly known as: VALTREX Take 1 tablet (500 mg total) by mouth daily.   Vitamin D 50 MCG (2000 UT) Caps Take 2,000 Units by mouth daily.       Allergies:  Allergies  Allergen Reactions  . Succinylsulphathiazole Rash  . Sulfamethoxazole-Trimethoprim Rash  . Tetracyclines & Related Rash    Family History: Family History  Problem Relation Age of Onset  . Cancer Mother        pancreatic  . Diabetes Mother   . Stroke Mother   . Heart disease Mother   . Hyperlipidemia Mother   . Hypertension  Mother   . Heart attack Mother   . Heart disease Father   . Stroke Father   . Diabetes Father   . Hypertension Father   . Heart attack Father   . Hyperlipidemia Father   . Pancreatic cancer Father   . Cancer Sister   . Cancer Brother        lung  . Cancer Brother   . Kidney cancer Neg Hx   . Bladder Cancer Neg Hx   . Prostate cancer  Neg Hx     Social History:  reports that he quit smoking about 35 years ago. His smoking use included cigarettes. He has a 10.00 pack-year smoking history. He has never used smokeless tobacco. He reports that he does not drink alcohol or use drugs.  ROS: UROLOGY Frequent Urination?: Yes Hard to postpone urination?: No Burning/pain with urination?: No Get up at night to urinate?: Yes Leakage of urine?: No Urine stream starts and stops?: No Trouble starting stream?: No Do you have to strain to urinate?: No Blood in urine?: No Urinary tract infection?: No Sexually transmitted disease?: No Injury to kidneys or bladder?: No Painful intercourse?: No Weak stream?: No Erection problems?: Yes Penile pain?: No  Gastrointestinal Nausea?: No Vomiting?: No Indigestion/heartburn?: No Diarrhea?: No Constipation?: No  Constitutional Fever: No Night sweats?: No Weight loss?: No Fatigue?: No  Skin Skin rash/lesions?: No Itching?: No  Eyes Blurred vision?: No Double vision?: No  Ears/Nose/Throat Sore throat?: No Sinus problems?: No  Hematologic/Lymphatic Swollen glands?: No Easy bruising?: No  Cardiovascular Leg swelling?: No Chest pain?: No  Respiratory Cough?: No Shortness of breath?: No  Endocrine Excessive thirst?: No  Musculoskeletal Back pain?: No Joint pain?: No  Neurological Headaches?: No Dizziness?: No  Psychologic Depression?: No Anxiety?: No  Physical Exam: BP 130/79   Pulse 84   Ht 5\' 10"  (1.778 m)   Wt 200 lb (90.7 kg)   BMI 28.70 kg/m   Constitutional:  Alert and oriented, No acute  distress. HEENT: [place] AT, moist mucus membranes.  Trachea midline, no masses. Cardiovascular: No clubbing, cyanosis, or edema. Respiratory: Normal respiratory effort, no increased work of breathing. GU: Prostate 60 g, smooth without nodules Skin: No rashes, bruises or suspicious lesions. Neurologic: Grossly intact, no focal deficits, moving all 4 extremities. Psychiatric: Normal mood and affect.   Assessment & Plan:    - T1c very low risk prostate cancer Most recent PSA has bumped to 7.0.  We discussed options of repeat standard biopsy, prostate MRI or repeating the PSA.  I recommended an MRI initially.  He does have a [employer] loop recorder and will need to see if he is an MRI candidate.   Abbie Sons, [place] [address], [place], [place] [address] [phone]

## 2019-08-01 ENCOUNTER — Other Ambulatory Visit: Payer: Self-pay | Admitting: Pharmacy Technician

## 2019-08-01 NOTE — Patient Outreach (Signed)
[facility]) Care Management  08/01/2019  Andrew Cobb 07/20/1950 [id number]    Successful call placed to patient regarding patient assistance application(s) for Jardiance with [employer] , HIPAA identifiers verified.   Patient informed he has received the application but has not mailed them back in. Denies having any questions about the application. Informs will place in mail this week.  Follow up:  Will route note to embedded [facility] RPh Catie Darnelle Maffucci for case closure if document(s) have not been received in the next 15 business days.  [name] P. Aislee Landgren, [place] Management [phone]

## 2019-08-09 ENCOUNTER — Other Ambulatory Visit: Payer: Self-pay | Admitting: Pharmacy Technician

## 2019-08-09 NOTE — Patient Outreach (Signed)
[place]) Care Management  08/09/2019  Andrew Cobb 12-Dec-1950 [id number]    Received both patient and provider portion(s) of patient assistance application(s) for Jardiance. Faxed completed application and required documents into [employer].  Will follow up with company(ies) in 7-10 business days to check status of application(s).  [name] P. [name], [place] Management [phone]

## 2019-08-19 ENCOUNTER — Other Ambulatory Visit: Payer: Self-pay | Admitting: Pharmacy Technician

## 2019-08-19 NOTE — Patient Outreach (Signed)
[facility]) Care Management  08/19/2019  Andrew Cobb 02-06-1951 [id number]  Care coordination call placed to [employer] in regards to patient's application for Jardiance.  Spoke to [place] who informed patient was partially approved. She informed he needs to apply for LIS based on the information he provided on the application. She informed the letter would need to be faxed into [employer] and if he receives partial LIS then a proof of co-pay from the pharmacy would also need to be submitted. She informed in the meantime the patient will be sent a one time order of a 90 days supply of medication. The pharmacy is processing the order and expected delivery in 7-10 business days to his home.  Will route note to [place] to assist patient in applying for LIS and will also follow up with patient in 10-14 business days to inquire if he received the one time shipment.  Emony Dormer P. Juanda Luba, [place] Management [phone]

## 2019-08-24 ENCOUNTER — Ambulatory Visit (INDEPENDENT_AMBULATORY_CARE_PROVIDER_SITE_OTHER): Payer: PPO | Admitting: Pharmacist

## 2019-08-24 DIAGNOSIS — E1165 Type 2 diabetes mellitus with hyperglycemia: Secondary | ICD-10-CM | POA: Diagnosis not present

## 2019-08-24 NOTE — Patient Instructions (Signed)
Visit Information  Goals Addressed            This Visit's Progress     Patient Stated   . "I want to work on my diabetes" (pt-stated)       Current Barriers:  . Diabetes: uncontrolled, most recent A1c 7.1% ; goal <6.5% per neurology o Applied for [employer] assistance through [employer]. Given temporary 90 day approval; needs to apply for LIS. Income met criteria, as they are currently providing financial support for 2 grandchildren . Current antihyperglycemic regimen: metformin 1000 mg BID, starting Jardiance 10 mg daily  . Cardiovascular risk reduction (post 3 CVA in past year, most recent admission 02/2019 o Current hypertensive regimen: benazepril 40 mg daily, BP well controlled at home 120s/70s o Current hyperlipidemia regimen: atorvastatin 80 mg daily, last LDL very well controlled <70; ASA 81 mg daily (avoiding clopidogrel d/t recent hemorrhagic stroke)  Pharmacist Clinical Goal(s):  [name] Kitchen Over the next 90 days, patient with work with PharmD and primary care provider to address optimized hyperglycemic management  Interventions: . Contacted patient, discussed that temporary 90 day supply of Jardiance is coming to his house. He verbalizes understanding. Asks that I speak with his wife, [name], regarding LIS application, as she manages their finances.  Tonye Royalty. Discussed LIS application process. With her permission, collaboratively completed the LIS application for her husband and her over the phone. Counseled that she should expect a determination in the mail in 4-6 weeks. They will call me when they receive this.  Patient Self Care Activities:  . Patient will check blood glucose daily, document, and provide at future appointments . Patient will take medications as prescribed . Patient will report any questions or concerns to provider   Please see past updates related to this goal by clicking on the "Past Updates" button in the selected goal         Patient  verbalizes understanding of instructions provided today.   Plan:  - PCP appt 09/19/19. Scheduled f/u call 10/12/19  Catie Darnelle Maffucci, PharmD, [place] [phone]

## 2019-08-24 NOTE — Chronic Care Management (AMB) (Signed)
Chronic Care Management   Follow Up Note   08/24/2019 Name: Andrew Cobb MRN: [id number] DOB: [birth year]  Referred by: Guadalupe Maple, MD/[name], Henrine Screws NP Reason for referral : Chronic Care Management (Medication Management)   Andrew Cobb is a 69 y.o. year old male who is a primary care patient of Crissman, Jeannette How, MD. The CCM team was consulted for assistance with chronic disease management and care coordination needs.    Contacted patient for medication management review and medication access assistance.   Review of patient status, including review of consultants reports, relevant laboratory and other test results, and collaboration with appropriate care team members and the patient's provider was performed as part of comprehensive patient evaluation and provision of chronic care management services.    SDOH (Social Determinants of Health) screening performed today: Financial Strain . See Care Plan for related entries.   Outpatient Encounter Medications as of 08/24/2019  Medication Sig  . empagliflozin (JARDIANCE) 10 MG TABS tablet Take 10 mg by mouth daily before breakfast.  . amitriptyline (ELAVIL) 25 MG tablet Take 1 tablet (25 mg total) by mouth at bedtime.  [name] Kitchen aspirin EC 81 MG EC tablet Take 1 tablet (81 mg total) by mouth daily.  [name] Kitchen atorvastatin (LIPITOR) 40 MG tablet Take 1 tablet (40 mg total) by mouth at bedtime.  . benazepril (LOTENSIN) 40 MG tablet Take 1 tablet (40 mg total) by mouth daily.  . cetirizine (ZYRTEC) 10 MG tablet Take 10 mg by mouth daily.  . Cholecalciferol (VITAMIN D) 2000 units CAPS Take 2,000 Units by mouth daily.  [name] Kitchen glucose blood test strip 1 each by Other route 2 (two) times daily. DX E11.9  . Lancets (ONETOUCH ULTRASOFT) lancets 1 each by Other route 2 (two) times daily. Dx E11.9  . Magnesium 250 MG TABS Take 250 mg by mouth at bedtime.   . metFORMIN (GLUCOPHAGE) 500 MG tablet Take 2 tablets (1,000 mg total) by mouth 2 (two) times daily with  a meal. (Patient taking differently: Take 1,000 mg by mouth 2 (two) times daily with a meal. 1 tab twice a day)  . Multiple Vitamins-Minerals (MULTIVITAMIN PO) Take 1 tablet by mouth daily.   . valACYclovir (VALTREX) 500 MG tablet Take 1 tablet (500 mg total) by mouth daily.   No facility-administered encounter medications on file as of 08/24/2019.     Objective:   Goals Addressed            This Visit's Progress     Patient Stated   . "I want to work on my diabetes" (pt-stated)       Current Barriers:  . Diabetes: uncontrolled, most recent A1c 7.1% ; goal <6.5% per neurology o Applied for [employer] assistance through [employer]. Given temporary 90 day approval; needs to apply for LIS. Income met criteria, as they are currently providing financial support for 2 grandchildren . Current antihyperglycemic regimen: metformin 1000 mg BID, starting Jardiance 10 mg daily  . Cardiovascular risk reduction (post 3 CVA in past year, most recent admission 02/2019 o Current hypertensive regimen: benazepril 40 mg daily, BP well controlled at home 120s/70s o Current hyperlipidemia regimen: atorvastatin 80 mg daily, last LDL very well controlled <70; ASA 81 mg daily (avoiding clopidogrel d/t recent hemorrhagic stroke)  Pharmacist Clinical Goal(s):  [name] Kitchen Over the next 90 days, patient with work with PharmD and primary care provider to address optimized hyperglycemic management  Interventions: . Contacted patient, discussed that temporary 90 day supply of Jardiance  is coming to his house. He verbalizes understanding. Asks that I speak with his wife, [name], regarding LIS application, as she manages their finances.  Tonye Royalty. Discussed LIS application process. With her permission, collaboratively completed the LIS application for her husband and her over the phone. Counseled that she should expect a determination in the mail in 4-6 weeks. They will call me when they receive  this.  Patient Self Care Activities:  . Patient will check blood glucose daily, document, and provide at future appointments . Patient will take medications as prescribed . Patient will report any questions or concerns to provider   Please see past updates related to this goal by clicking on the "Past Updates" button in the selected goal          Plan:  - PCP appt 09/19/19. Scheduled f/u call 10/12/19  Catie Darnelle Maffucci, PharmD, [place] [phone]

## 2019-08-25 ENCOUNTER — Ambulatory Visit
Admission: RE | Admit: 2019-08-25 | Discharge: 2019-08-25 | Disposition: A | Discharge status: Home / Self Care | Payer: PPO | Source: Ambulatory Visit | Attending: Urology | Admitting: Urology

## 2019-08-25 ENCOUNTER — Encounter: Payer: Self-pay | Admitting: Urology

## 2019-08-25 ENCOUNTER — Other Ambulatory Visit: Payer: Self-pay

## 2019-08-25 ENCOUNTER — Telehealth: Payer: Self-pay | Admitting: Urology

## 2019-08-25 DIAGNOSIS — R972 Elevated prostate specific antigen [PSA]: Secondary | ICD-10-CM | POA: Diagnosis not present

## 2019-08-25 DIAGNOSIS — C61 Malignant neoplasm of prostate: Secondary | ICD-10-CM

## 2019-08-25 LAB — POCT I-STAT CREATININE: Creatinine, Ser: 1.1 mg/dL (ref 0.61–1.24)

## 2019-08-25 MED ORDER — GADOBUTROL 1 MMOL/ML IV SOLN
9.0000 mL | Freq: Once | INTRAVENOUS | Status: AC | PRN
  Administered 2019-08-25: 9 mL via INTRAVENOUS

## 2019-08-26 DIAGNOSIS — J019 Acute sinusitis, unspecified: Secondary | ICD-10-CM | POA: Diagnosis not present

## 2019-08-26 DIAGNOSIS — Z20828 Contact with and (suspected) exposure to other viral communicable diseases: Secondary | ICD-10-CM | POA: Diagnosis not present

## 2019-08-26 DIAGNOSIS — J029 Acute pharyngitis, unspecified: Secondary | ICD-10-CM | POA: Diagnosis not present

## 2019-08-29 ENCOUNTER — Ambulatory Visit (INDEPENDENT_AMBULATORY_CARE_PROVIDER_SITE_OTHER): Payer: PPO | Admitting: *Deleted

## 2019-08-29 DIAGNOSIS — I639 Cerebral infarction, unspecified: Secondary | ICD-10-CM

## 2019-08-29 LAB — CUP PACEART REMOTE DEVICE CHECK
Date Time Interrogation Session: 20210222005552
Implantable Pulse Generator Implant Date: 20200316

## 2019-08-30 NOTE — Progress Notes (Signed)
ILR Remote 

## 2019-09-02 ENCOUNTER — Other Ambulatory Visit: Payer: Self-pay | Admitting: Pharmacy Technician

## 2019-09-02 NOTE — Patient Outreach (Signed)
[facility]) Care Management  09/02/2019  Andrew Cobb 1951-03-30 [id number]     Unsuccessful call placed to patient regarding patient assistance medication delivery of Jardiance with [employer], HIPAA compliant voicemail left.   Was calling patient to inquire if he has received his initial shipment of Jardiance but unfortunately he did not answer the phone.  HIPAA compliant message was left for a return call.  Follow up:  Will follow up with patient in 5-10 business days if call is not returned.  [name] P. Etty Isaac, [place] Management [phone]

## 2019-09-07 ENCOUNTER — Observation Stay (HOSPITAL_COMMUNITY): Payer: PPO

## 2019-09-07 ENCOUNTER — Inpatient Hospital Stay (HOSPITAL_COMMUNITY)
Admission: EM | Admit: 2019-09-07 | Discharge: 2019-09-16 | DRG: 100 | Disposition: A | Discharge status: Home Health | Payer: PPO | Attending: Internal Medicine | Admitting: Internal Medicine

## 2019-09-07 ENCOUNTER — Encounter (HOSPITAL_COMMUNITY): Payer: Self-pay

## 2019-09-07 ENCOUNTER — Emergency Department (HOSPITAL_COMMUNITY): Payer: PPO

## 2019-09-07 ENCOUNTER — Other Ambulatory Visit: Payer: Self-pay

## 2019-09-07 DIAGNOSIS — Z9884 Bariatric surgery status: Secondary | ICD-10-CM

## 2019-09-07 DIAGNOSIS — Z8546 Personal history of malignant neoplasm of prostate: Secondary | ICD-10-CM

## 2019-09-07 DIAGNOSIS — E1169 Type 2 diabetes mellitus with other specified complication: Secondary | ICD-10-CM | POA: Diagnosis present

## 2019-09-07 DIAGNOSIS — J690 Pneumonitis due to inhalation of food and vomit: Secondary | ICD-10-CM | POA: Diagnosis present

## 2019-09-07 DIAGNOSIS — R4182 Altered mental status, unspecified: Secondary | ICD-10-CM | POA: Diagnosis present

## 2019-09-07 DIAGNOSIS — G9340 Encephalopathy, unspecified: Secondary | ICD-10-CM

## 2019-09-07 DIAGNOSIS — E871 Hypo-osmolality and hyponatremia: Secondary | ICD-10-CM | POA: Diagnosis present

## 2019-09-07 DIAGNOSIS — Z955 Presence of coronary angioplasty implant and graft: Secondary | ICD-10-CM | POA: Diagnosis not present

## 2019-09-07 DIAGNOSIS — I69328 Other speech and language deficits following cerebral infarction: Secondary | ICD-10-CM

## 2019-09-07 DIAGNOSIS — I2510 Atherosclerotic heart disease of native coronary artery without angina pectoris: Secondary | ICD-10-CM | POA: Diagnosis present

## 2019-09-07 DIAGNOSIS — E860 Dehydration: Secondary | ICD-10-CM | POA: Diagnosis present

## 2019-09-07 DIAGNOSIS — J189 Pneumonia, unspecified organism: Secondary | ICD-10-CM | POA: Diagnosis present

## 2019-09-07 DIAGNOSIS — Z66 Do not resuscitate: Secondary | ICD-10-CM | POA: Diagnosis present

## 2019-09-07 DIAGNOSIS — I152 Hypertension secondary to endocrine disorders: Secondary | ICD-10-CM | POA: Diagnosis present

## 2019-09-07 DIAGNOSIS — E1159 Type 2 diabetes mellitus with other circulatory complications: Secondary | ICD-10-CM | POA: Diagnosis not present

## 2019-09-07 DIAGNOSIS — E1165 Type 2 diabetes mellitus with hyperglycemia: Secondary | ICD-10-CM | POA: Diagnosis present

## 2019-09-07 DIAGNOSIS — J9601 Acute respiratory failure with hypoxia: Secondary | ICD-10-CM | POA: Diagnosis not present

## 2019-09-07 DIAGNOSIS — Z8673 Personal history of transient ischemic attack (TIA), and cerebral infarction without residual deficits: Secondary | ICD-10-CM

## 2019-09-07 DIAGNOSIS — Z7982 Long term (current) use of aspirin: Secondary | ICD-10-CM

## 2019-09-07 DIAGNOSIS — Z96611 Presence of right artificial shoulder joint: Secondary | ICD-10-CM | POA: Diagnosis present

## 2019-09-07 DIAGNOSIS — K219 Gastro-esophageal reflux disease without esophagitis: Secondary | ICD-10-CM | POA: Diagnosis present

## 2019-09-07 DIAGNOSIS — Z8349 Family history of other endocrine, nutritional and metabolic diseases: Secondary | ICD-10-CM

## 2019-09-07 DIAGNOSIS — Z20822 Contact with and (suspected) exposure to covid-19: Secondary | ICD-10-CM | POA: Diagnosis present

## 2019-09-07 DIAGNOSIS — Z8614 Personal history of Methicillin resistant Staphylococcus aureus infection: Secondary | ICD-10-CM

## 2019-09-07 DIAGNOSIS — Z9049 Acquired absence of other specified parts of digestive tract: Secondary | ICD-10-CM | POA: Diagnosis not present

## 2019-09-07 DIAGNOSIS — J9691 Respiratory failure, unspecified with hypoxia: Secondary | ICD-10-CM

## 2019-09-07 DIAGNOSIS — Z7984 Long term (current) use of oral hypoglycemic drugs: Secondary | ICD-10-CM

## 2019-09-07 DIAGNOSIS — N179 Acute kidney failure, unspecified: Secondary | ICD-10-CM | POA: Diagnosis present

## 2019-09-07 DIAGNOSIS — Z96653 Presence of artificial knee joint, bilateral: Secondary | ICD-10-CM | POA: Diagnosis present

## 2019-09-07 DIAGNOSIS — Z833 Family history of diabetes mellitus: Secondary | ICD-10-CM

## 2019-09-07 DIAGNOSIS — I639 Cerebral infarction, unspecified: Secondary | ICD-10-CM

## 2019-09-07 DIAGNOSIS — Z87891 Personal history of nicotine dependence: Secondary | ICD-10-CM

## 2019-09-07 DIAGNOSIS — R569 Unspecified convulsions: Secondary | ICD-10-CM | POA: Diagnosis present

## 2019-09-07 DIAGNOSIS — G92 Toxic encephalopathy: Secondary | ICD-10-CM | POA: Diagnosis present

## 2019-09-07 DIAGNOSIS — Z79899 Other long term (current) drug therapy: Secondary | ICD-10-CM

## 2019-09-07 DIAGNOSIS — E785 Hyperlipidemia, unspecified: Secondary | ICD-10-CM | POA: Diagnosis present

## 2019-09-07 DIAGNOSIS — G4733 Obstructive sleep apnea (adult) (pediatric): Secondary | ICD-10-CM | POA: Diagnosis present

## 2019-09-07 DIAGNOSIS — Z800 Family history of malignant neoplasm of digestive organs: Secondary | ICD-10-CM

## 2019-09-07 DIAGNOSIS — R270 Ataxia, unspecified: Secondary | ICD-10-CM | POA: Diagnosis not present

## 2019-09-07 DIAGNOSIS — Z8249 Family history of ischemic heart disease and other diseases of the circulatory system: Secondary | ICD-10-CM

## 2019-09-07 DIAGNOSIS — R918 Other nonspecific abnormal finding of lung field: Secondary | ICD-10-CM | POA: Diagnosis not present

## 2019-09-07 DIAGNOSIS — I10 Essential (primary) hypertension: Secondary | ICD-10-CM | POA: Diagnosis not present

## 2019-09-07 DIAGNOSIS — Z823 Family history of stroke: Secondary | ICD-10-CM

## 2019-09-07 DIAGNOSIS — R001 Bradycardia, unspecified: Secondary | ICD-10-CM | POA: Diagnosis not present

## 2019-09-07 HISTORY — DX: Cerebral infarction, unspecified: I63.9

## 2019-09-07 LAB — DIFFERENTIAL
Abs Immature Granulocytes: 0.06 10*3/uL (ref 0.00–0.07)
Basophils Absolute: 0 10*3/uL (ref 0.0–0.1)
Basophils Relative: 0 %
Eosinophils Absolute: 0 10*3/uL (ref 0.0–0.5)
Eosinophils Relative: 0 %
Immature Granulocytes: 1 %
Lymphocytes Relative: 9 %
Lymphs Abs: 0.7 10*3/uL (ref 0.7–4.0)
Monocytes Absolute: 0.3 10*3/uL (ref 0.1–1.0)
Monocytes Relative: 4 %
Neutro Abs: 7 10*3/uL (ref 1.7–7.7)
Neutrophils Relative %: 86 %

## 2019-09-07 LAB — COMPREHENSIVE METABOLIC PANEL
ALT: 36 U/L (ref 0–44)
AST: 52 U/L — ABNORMAL HIGH (ref 15–41)
Albumin: 3.2 g/dL — ABNORMAL LOW (ref 3.5–5.0)
Alkaline Phosphatase: 52 U/L (ref 38–126)
Anion gap: 15 (ref 5–15)
BUN: 25 mg/dL — ABNORMAL HIGH (ref 8–23)
CO2: 18 mmol/L — ABNORMAL LOW (ref 22–32)
Calcium: 8 mg/dL — ABNORMAL LOW (ref 8.9–10.3)
Chloride: 101 mmol/L (ref 98–111)
Creatinine, Ser: 1.29 mg/dL — ABNORMAL HIGH (ref 0.61–1.24)
GFR calc Af Amer: >60 mL/min (ref >60)
GFR calc non Af Amer: 57 mL/min — ABNORMAL LOW (ref >60)
Glucose, Bld: 161 mg/dL — ABNORMAL HIGH (ref 70–99)
Potassium: 5 mmol/L (ref 3.5–5.1)
Sodium: 134 mmol/L — ABNORMAL LOW (ref 135–145)
Total Bilirubin: 1.5 mg/dL — ABNORMAL HIGH (ref 0.3–1.2)
Total Protein: 6.1 g/dL — ABNORMAL LOW (ref 6.5–8.1)

## 2019-09-07 LAB — CBC
HCT: 44.1 % (ref 39.0–52.0)
Hemoglobin: 13.8 g/dL (ref 13.0–17.0)
MCH: 30.6 pg (ref 26.0–34.0)
MCHC: 31.3 g/dL (ref 30.0–36.0)
MCV: 97.8 fL (ref 80.0–100.0)
Platelets: 142 10*3/uL — ABNORMAL LOW (ref 150–400)
RBC: 4.51 MIL/uL (ref 4.22–5.81)
RDW: 13.9 % (ref 11.5–15.5)
WBC: 8.2 10*3/uL (ref 4.0–10.5)
nRBC: 0 % (ref 0.0–0.2)

## 2019-09-07 LAB — ETHANOL: Alcohol, Ethyl (B): <10 mg/dL (ref <10)

## 2019-09-07 LAB — I-STAT CHEM 8, ED
BUN: 29 mg/dL — ABNORMAL HIGH (ref 8–23)
Calcium, Ion: 0.97 mmol/L — ABNORMAL LOW (ref 1.15–1.40)
Chloride: 102 mmol/L (ref 98–111)
Creatinine, Ser: 1.2 mg/dL (ref 0.61–1.24)
Glucose, Bld: 160 mg/dL — ABNORMAL HIGH (ref 70–99)
HCT: 39 % (ref 39.0–52.0)
Hemoglobin: 13.3 g/dL (ref 13.0–17.0)
Potassium: 5.1 mmol/L (ref 3.5–5.1)
Sodium: 132 mmol/L — ABNORMAL LOW (ref 135–145)
TCO2: 23 mmol/L (ref 22–32)

## 2019-09-07 LAB — CBG MONITORING, ED: Glucose-Capillary: 185 mg/dL — ABNORMAL HIGH (ref 70–99)

## 2019-09-07 LAB — RAPID URINE DRUG SCREEN, HOSP PERFORMED
Amphetamines: NOT DETECTED
Barbiturates: NOT DETECTED
Benzodiazepines: NOT DETECTED
Cocaine: NOT DETECTED
Opiates: NOT DETECTED
Tetrahydrocannabinol: NOT DETECTED

## 2019-09-07 LAB — URINALYSIS, ROUTINE W REFLEX MICROSCOPIC
Bacteria, UA: NONE SEEN
Bilirubin Urine: NEGATIVE
Glucose, UA: >=500 mg/dL — AB
Hgb urine dipstick: NEGATIVE
Ketones, ur: 80 mg/dL — AB
Leukocytes,Ua: NEGATIVE
Nitrite: NEGATIVE
Protein, ur: NEGATIVE mg/dL
Specific Gravity, Urine: 1.028 (ref 1.005–1.030)
pH: 5 (ref 5.0–8.0)

## 2019-09-07 LAB — APTT: aPTT: 28 seconds (ref 24–36)

## 2019-09-07 LAB — PROTIME-INR
INR: 1 (ref 0.8–1.2)
Prothrombin Time: 13.5 seconds (ref 11.4–15.2)

## 2019-09-07 MED ORDER — ENOXAPARIN SODIUM 40 MG/0.4ML ~~LOC~~ SOLN
40.0000 mg | SUBCUTANEOUS | Status: DC
  Administered 2019-09-07 – 2019-09-15 (×9): 40 mg via SUBCUTANEOUS
  Filled 2019-09-07 (×9): qty 0.4

## 2019-09-07 MED ORDER — SODIUM CHLORIDE 0.9 % IV SOLN: INTRAVENOUS | Status: AC

## 2019-09-07 MED ORDER — ACETAMINOPHEN 325 MG PO TABS
650.0000 mg | ORAL_TABLET | ORAL | Status: DC | PRN
  Administered 2019-09-10 – 2019-09-13 (×2): 650 mg via ORAL
  Filled 2019-09-07 (×2): qty 2

## 2019-09-07 MED ORDER — LORAZEPAM 2 MG/ML IJ SOLN: 1.0000 mg | INTRAMUSCULAR | Status: DC | PRN

## 2019-09-07 MED ORDER — STROKE: EARLY STAGES OF RECOVERY BOOK: Freq: Once | Status: DC

## 2019-09-07 MED ORDER — ACETAMINOPHEN 160 MG/5ML PO SOLN: 650.0000 mg | ORAL | Status: DC | PRN

## 2019-09-07 MED ORDER — LEVETIRACETAM IN NACL 1500 MG/100ML IV SOLN
1500.0000 mg | INTRAVENOUS | Status: AC
  Administered 2019-09-07: 1500 mg via INTRAVENOUS
  Filled 2019-09-07: qty 100

## 2019-09-07 MED ORDER — ATORVASTATIN CALCIUM 40 MG PO TABS
40.0000 mg | ORAL_TABLET | Freq: Every day | ORAL | Status: DC
  Administered 2019-09-07 – 2019-09-15 (×9): 40 mg via ORAL
  Filled 2019-09-07 (×9): qty 1

## 2019-09-07 MED ORDER — ASPIRIN EC 81 MG PO TBEC
81.0000 mg | DELAYED_RELEASE_TABLET | Freq: Every day | ORAL | Status: DC
  Administered 2019-09-07 – 2019-09-16 (×10): 81 mg via ORAL
  Filled 2019-09-07 (×10): qty 1

## 2019-09-07 MED ORDER — SENNOSIDES-DOCUSATE SODIUM 8.6-50 MG PO TABS: 1.0000 | ORAL_TABLET | Freq: Every evening | ORAL | Status: DC | PRN

## 2019-09-07 MED ORDER — ACETAMINOPHEN 650 MG RE SUPP: 650.0000 mg | RECTAL | Status: DC | PRN

## 2019-09-07 MED ORDER — LEVETIRACETAM 500 MG PO TABS
500.0000 mg | ORAL_TABLET | Freq: Two times a day (BID) | ORAL | Status: DC
  Administered 2019-09-08 – 2019-09-16 (×17): 500 mg via ORAL
  Filled 2019-09-07 (×17): qty 1

## 2019-09-07 NOTE — ED Provider Notes (Signed)
[facility] EMERGENCY DEPARTMENT Provider Note   CSN: [id number] Arrival date & time: 09/07/19  1001     History Chief Complaint  Patient presents with  . Altered Mental Status    Andrew Cobb is a 69 y.o. male.  Patient brought in accompanied by his wife.  For the past few days not been acting himself.  But the main concern is he has not been walking normally she seems to think he seems to be off balance.  No slurred speech.  The patient struggles to find words but when they come out they are pronounced normally.  And make sense.  Patient has a history of stroke in August of last year.  Patient denies any chest pain any shortness of breath any upper respiratory infections any headache.  Currently no headache but his wife does report there was headache a few days ago.  Past medical history is significant for coronary artery disease hyperlipidemia diabetes and a prior history of stroke.  Patient's wife states that there is a little bit of confusion since the prior strokes.  Patient's wife says that he is not eating as much as he normally does.        Past Medical History:  Diagnosis Date  . Arthritis   . Asthma   . Cancer [facility])    prostate  . Chronic venous insufficiency    varicose vein lower extremity with inflammation  . Coronary artery disease 1996   two stents placed   . Diabetes mellitus without complication ([place])    type 2 on metformin  . GERD (gastroesophageal reflux disease)    no issues since gastric bypass surgery as stated per pt  . Hyperlipidemia   . Hypogonadism in male   . MRSA (methicillin resistant Staphylococcus aureus) infection    07/30/2008 thru 08/07/2008  . Sleep apnea    on BIPAP  . Stented coronary artery   . Stroke ([place])   . Thrombocythemia [facility])     Patient Active Problem List   Diagnosis Date Noted  . Morbid obesity ([place]) 06/19/2019  . Hyperlipidemia associated with type 2 diabetes mellitus ([place]) 06/19/2019  . Expressive  aphasia 06/19/2019  . Vitamin D deficiency 06/19/2019  . Bradycardia with 31-40 beats per minute 02/08/2019  . Chronic venous insufficiency   . Asthma   . Arthritis   . GERD (gastroesophageal reflux disease)   . Hypogonadism in male   . Cryptogenic stroke ([place])   . Thrombocythemia ([place])   . 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 H/O right coronary artery stent placement   . Allergic rhinitis 08/19/2018  . Cervical radiculopathy 08/17/2017  . Advanced care planning/counseling discussion 07/21/2017  . History of prostate cancer 12/08/2016  . H/O bariatric surgery 07/17/2016  . Hypertension associated with diabetes ([name]) 04/24/2015  . Uncontrolled type 2 diabetes mellitus with hyperglycemia, without long-term current use of insulin ([place]) 04/24/2015  . Chronic sinusitis 08/13/2009  . Coronary atherosclerosis 04/02/2009  . 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 OSA (obstructive sleep apnea) 04/02/2009  . Coronary artery disease 1996    Past Surgical History:  Procedure Laterality Date  . ANGIOPLASTY    . ANGIOPLASTY     with stent 04/07/1995  . ANTERIOR CERVICAL DECOMPRESSION/DISCECTOMY FUSION 4 LEVELS N/A 08/17/2017   Procedure: Anterior discectomy with fusion and plate fixation Cervical Three-Four, Four-Five, Five-Six, and Six-Seven Fusion;  Surgeon: Cyndy Freeze  Kevan Ny, MD;  Location: [facility];  Service: Neurosurgery;  Laterality: N/A;  Anterior discectomy with fusion and plate fixation Cervical Three-Four, Four-Five, Five-Six, and Six-Seven Fusion   . APPENDECTOMY     1966  . BUBBLE STUDY  09/20/2018   Procedure: BUBBLE STUDY;  Surgeon: Josue Hector, MD;  Location: [facility];  Service: Cardiovascular;;  . [place]  . CARDIOVASCULAR STRESS TEST     07/31/2011  . CARPAL TUNNEL RELEASE Left   . CHOLECYSTECTOMY     2006  . colonscopy       08/25/2012  . EYE SURGERY Bilateral    cataract  . FUNCTIONAL ENDOSCOPIC SINUS SURGERY     11/10/2013  . GASTRIC BYPASS     10/05/2012  . HERNIA REPAIR     left inguinal 1981  . INCISION AND DRAINAGE ABSCESS Right 02/26/2017   Procedure: INCISION AND DRAINAGE ABSCESS;  Surgeon: Nickie Retort, MD;  Location: [facility];  Service: Urology;  Laterality: Right;  . JOINT REPLACEMENT     bilateral  . left ankle surgery      05/03/2003   . left carpel tunnel      09/18/1993  . left knee meniscal tear      01/25/2010  . left knee meniscal tear repair      05/04/1996  . left rotator cuff repair      05/03/2003   . LOOP RECORDER INSERTION N/A 09/20/2018   Procedure: LOOP RECORDER INSERTION;  Surgeon: Evans Lance, MD;  Location: [facility] CV [facility];  Service: Cardiovascular;  Laterality: N/A;  . REPLACEMENT TOTAL KNEE BILATERAL  07/13/2015  . right ankle surgery      fracture has 2 screws 07/07/1997  . right carpel tunnel      05/16/1992  . right shoulder replacement      01/27/2006  . SCROTAL EXPLORATION Right 02/26/2017   Procedure: SCROTUM EXPLORATION;  Surgeon: Nickie Retort, MD;  Location: [facility];  Service: Urology;  Laterality: Right;  . TEE WITHOUT CARDIOVERSION N/A 09/20/2018   Procedure: TRANSESOPHAGEAL ECHOCARDIOGRAM (TEE);  Surgeon: Josue Hector, MD;  Location: [facility];  Service: Cardiovascular;  Laterality: N/A;  . TOTAL KNEE ARTHROPLASTY Left 07/13/2015   Procedure: LEFT TOTAL KNEE ARTHROPLASTY;  Surgeon: Gaynelle Arabian, MD;  Location: [facility];  Service: Orthopedics;  Laterality: Left;       Family History  Problem Relation Age of Onset  . Cancer Mother        pancreatic  . Diabetes Mother   . Stroke Mother   . Heart disease Mother   . Hyperlipidemia Mother   . Hypertension Mother   . Heart attack Mother   . Heart disease Father   . Stroke Father   . Diabetes Father   . Hypertension Father   . Heart attack Father   . Hyperlipidemia Father   .  Pancreatic cancer Father   . Cancer Sister   . Cancer Brother        lung  . Cancer Brother   . Kidney cancer Neg Hx   . Bladder Cancer Neg Hx   . Prostate cancer Neg Hx     Social History   Tobacco Use  . Smoking status: Former Smoker    Packs/day: 1.00    Years: 10.00    Pack years: 10.00    Types: Cigarettes    Quit date: 07/07/1984    Years since quitting: 35.1  . Smokeless tobacco: Never Used  .  Tobacco comment: quit 1986  Substance Use Topics  . Alcohol use: No    Alcohol/week: 0.0 standard drinks  . Drug use: No    Home Medications Prior to Admission medications   Medication Sig Start Date End Date Taking? Authorizing Provider  amitriptyline (ELAVIL) 25 MG tablet Take 1 tablet (25 mg total) by mouth at bedtime. 06/21/19   Marnee Guarneri T, NP  aspirin EC 81 MG EC tablet Take 1 tablet (81 mg total) by mouth daily. 02/11/19   Debbe Odea, MD  atorvastatin (LIPITOR) 40 MG tablet Take 1 tablet (40 mg total) by mouth at bedtime. 06/21/19   [name], Henrine Screws T, NP  benazepril (LOTENSIN) 40 MG tablet Take 1 tablet (40 mg total) by mouth daily. 06/21/19   [name], Henrine Screws T, NP  cetirizine (ZYRTEC) 10 MG tablet Take 10 mg by mouth daily.    [provider]  Cholecalciferol (VITAMIN D) 2000 units CAPS Take 2,000 Units by mouth daily.    [provider]  empagliflozin (JARDIANCE) 10 MG TABS tablet Take 10 mg by mouth daily before breakfast. 07/20/19   [name], Jolene T, NP  glucose blood test strip 1 each by Other route 2 (two) times daily. DX E11.9 12/30/18   Johnson, Megan P, DO  Lancets [facility] ULTRASOFT) lancets 1 each by Other route 2 (two) times daily. Dx E11.9 04/24/15   Arlis Porta., MD  Magnesium 250 MG TABS Take 250 mg by mouth at bedtime.     [provider]  metFORMIN (GLUCOPHAGE) 500 MG tablet Take 2 tablets (1,000 mg total) by mouth 2 (two) times daily with a meal. Patient taking differently: Take 1,000 mg by mouth 2 (two) times  daily with a meal. 1 tab twice a day 06/21/19   [name], Henrine Screws T, NP  Multiple Vitamins-Minerals (MULTIVITAMIN PO) Take 1 tablet by mouth daily.     [provider]  valACYclovir (VALTREX) 500 MG tablet Take 1 tablet (500 mg total) by mouth daily. 07/18/19   Marnee Guarneri T, NP    Allergies    Succinylsulphathiazole, Sulfamethoxazole-trimethoprim, and Tetracyclines & related  Review of Systems   Review of Systems  Constitutional: Positive for appetite change. Negative for chills and fever.  HENT: Negative for congestion, rhinorrhea and sore throat.   Eyes: Negative for visual disturbance.  Respiratory: Negative for cough and shortness of breath.   Cardiovascular: Negative for chest pain and leg swelling.  Gastrointestinal: Negative for abdominal pain, diarrhea, nausea and vomiting.  Genitourinary: Negative for dysuria.  Musculoskeletal: Negative for back pain and neck pain.  Skin: Negative for rash.  Neurological: Positive for speech difficulty. Negative for dizziness, light-headedness and headaches.  Hematological: Does not bruise/bleed easily.  Psychiatric/Behavioral: Positive for confusion.    Physical Exam Updated Vital Signs BP 113/66 (BP Location: Right Arm)   Pulse 92   Temp 97.8 F (36.6 C) (Oral)   Resp 16   SpO2 92%   Physical Exam Vitals and nursing note reviewed.  Constitutional:      Appearance: Normal appearance. He is well-developed.  HENT:     Head: Normocephalic and atraumatic.  Eyes:     Extraocular Movements: Extraocular movements intact.     Conjunctiva/sclera: Conjunctivae normal.     Pupils: Pupils are equal, round, and reactive to light.  Cardiovascular:     Rate and Rhythm: Normal rate and regular rhythm.     Heart sounds: No murmur.  Pulmonary:     Effort: Pulmonary effort is normal. No  respiratory distress.     Breath sounds: Normal breath sounds.  Abdominal:     Palpations: Abdomen is soft.     Tenderness: There is no abdominal  tenderness.  Musculoskeletal:        General: Normal range of motion.     Cervical back: Normal range of motion and neck supple.  Skin:    General: Skin is warm and dry.     Capillary Refill: Capillary refill takes less than 2 seconds.  Neurological:     General: No focal deficit present.     Mental Status: He is alert and oriented to person, place, and time.     Cranial Nerves: Cranial nerve deficit present.     Sensory: No sensory deficit.     Motor: No weakness.     Comments: Patient not walked however general strength is normal.  Does struggle a little bit for words but when they come out the pronunciation is appropriate.     ED Results / Procedures / Treatments   Labs (all labs ordered are listed, but only abnormal results are displayed) Labs Reviewed  CBG MONITORING, ED - Abnormal; Notable for the following components:      Result Value   Glucose-Capillary 185 (*)    All other components within normal limits  ETHANOL  PROTIME-INR  APTT  CBC  DIFFERENTIAL  COMPREHENSIVE METABOLIC PANEL  RAPID URINE DRUG SCREEN, HOSP PERFORMED  URINALYSIS, ROUTINE W REFLEX MICROSCOPIC  I-STAT CHEM 8, ED    EKG EKG Interpretation  Date/Time:  Wednesday September 07 2019 10:03:34 EST Ventricular Rate:  92 PR Interval:  198 QRS Duration: 88 QT Interval:  360 QTC Calculation: 445 R Axis:   30 Text Interpretation: Normal sinus rhythm Inferior infarct , age undetermined Anterolateral infarct , age undetermined Abnormal ECG Confirmed by Fredia Sorrow [phone]) on 09/07/2019 10:22:53 AM   Radiology No results found.  Procedures Procedures (including critical care time)  Medications Ordered in ED Medications - No data to display  ED Course  I have reviewed the triage vital signs and the nursing notes.  Pertinent labs & imaging results that were available during my care of the patient were reviewed by me and considered in my medical decision making (see chart for details).    MDM  Rules/Calculators/A&P                      Patient's labs without any significant abnormalities other than some slight hyponatremia.  The main concern is possible stroke.  Head CT showed evidence of the old strokes patient has MRI pending.  MRI will be significant if it shows evidence of acute stroke patient will require admission.  If it is negative for stroke would recommend walking the patient and see how well he does.  He may very well be able to go home.  Patient turned over to the evening emergency physician Dr. Stark Jock.    Final Clinical Impression(s) / ED Diagnoses Final diagnoses:  None    Rx / DC Orders ED Discharge Orders    None       Fredia Sorrow, MD 09/07/19 [phone]

## 2019-09-07 NOTE — ED Triage Notes (Signed)
Pt accompanied by wife who reports for the past few days pt has not been acting himself and has not been eating much. Denies slurred speech but has had difficulty getting his words out. Pt has hx stroke in August of last year. Pt arrives to triage alert, unsure why he is here, has no complaints. No neuro deficits noted in triage

## 2019-09-07 NOTE — H&P (Signed)
History and Physical    Andrew Cobb RKV:[id number] DOB: 1950-08-22 DOA: 09/07/2019  PCP: Guadalupe Maple, MD  Patient coming from: Home  I have personally briefly reviewed patient's old medical records in [place]  Chief Complaint: Altered mental status  HPI: Andrew Cobb is a 69 y.o. 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  History is limited from patient therefore majority of history is obtained from [place], chart review, and wife by phone.  Patient's wife states that normally patient is very talkative and able to do most of his ADLs including driving.  Over the last 3-4 days he has been appearing more confused, sitting by himself with staring spells, unable to operate the remote control, not conversing as he has before, and with significant decreased oral intake.  She states that he has been walking very slowly.  Patient was brought to the ED for further evaluation.  ED Course:  Initial vitals showed BP 113/66, pulse 85, RR 16, temp 97.8 Fahrenheit, SPO2 92% on room air.  Labs notable for WBC 8.2, hemoglobin 13.8, platelets 142,000, BUN 25, creatinine 1.29, sodium 134, potassium 5.0, bicarb 18, serum glucose 161, AST 52, ALT 36, alk phos 52, total bilirubin 1.5.  Serum ethanol is negative.  UDS is negative.  Urinalysis is negative for UTI.  CT head without contrast showed extensive chronic microvascular ischemic change remote infarcts.  MRI brain with a motion degraded examination.  A questionable acute infarct in the left parietal white matter versus persistent restricted diffusion at the site of a chronic infarct is noted.  Chronic lacunar infarct within the left lentiform nucleus is seen and new compared to prior MRI.  Neurology were  consulted due to concern for seizure activity patient was given a load of IV Keppra 1.5 g once and started on oral Keppra 500 mg twice daily.  The hospitalist service was consulted admit for further evaluation management.  Review of Systems:  Review of systems limited due to altered mental status.  Past Medical History:  Diagnosis Date  . Arthritis   . Asthma   . Cancer [facility])    prostate  . Chronic venous insufficiency    varicose vein lower extremity with inflammation  . Coronary artery disease 1996   two stents placed   . Diabetes mellitus without complication ([place])    type 2 on metformin  . GERD (gastroesophageal reflux disease)    no issues since gastric bypass surgery as stated per pt  . Hyperlipidemia   . Hypogonadism in male   . MRSA (methicillin resistant Staphylococcus aureus) infection    07/30/2008 thru 08/07/2008  . Sleep apnea    on BIPAP  . Stented coronary artery   . Stroke ([place])   . Thrombocythemia [facility])     Past Surgical History:  Procedure Laterality Date  . ANGIOPLASTY    . ANGIOPLASTY     with stent 04/07/1995  . ANTERIOR CERVICAL DECOMPRESSION/DISCECTOMY FUSION 4 LEVELS N/A 08/17/2017   Procedure: Anterior discectomy with fusion and plate fixation Cervical Three-Four, Four-Five, Five-Six, and Six-Seven Fusion;  Surgeon: Ditty, Kevan Ny, MD;  Location: [place];  Service: Neurosurgery;  Laterality: N/A;  Anterior discectomy with fusion and plate fixation Cervical Three-Four, Four-Five, Five-Six, and Six-Seven Fusion   . APPENDECTOMY  [place]  09/20/2018   Procedure: BUBBLE STUDY;  Surgeon: Josue Hector, MD;  Location: [facility] ENDOSCOPY;  Service: Cardiovascular;;  . [place]  . CARDIOVASCULAR STRESS TEST     07/31/2011  . CARPAL TUNNEL RELEASE Left   . CHOLECYSTECTOMY     2006  . colonscopy      08/25/2012  . EYE SURGERY Bilateral    cataract  . FUNCTIONAL ENDOSCOPIC SINUS SURGERY     11/10/2013  . GASTRIC BYPASS      10/05/2012  . HERNIA REPAIR     left inguinal 1981  . INCISION AND DRAINAGE ABSCESS Right 02/26/2017   Procedure: INCISION AND DRAINAGE ABSCESS;  Surgeon: Nickie Retort, MD;  Location: [facility] ORS;  Service: Urology;  Laterality: Right;  . JOINT REPLACEMENT     bilateral  . left ankle surgery      05/03/2003   . left carpel tunnel      09/18/1993  . left knee meniscal tear      01/25/2010  . left knee meniscal tear repair      05/04/1996  . left rotator cuff repair      05/03/2003   . LOOP RECORDER INSERTION N/A 09/20/2018   Procedure: LOOP RECORDER INSERTION;  Surgeon: Evans Lance, MD;  Location: [place] CV LAB;  Service: Cardiovascular;  Laterality: N/A;  . REPLACEMENT TOTAL KNEE BILATERAL  07/13/2015  . right ankle surgery      fracture has 2 screws 07/07/1997  . right carpel tunnel      05/16/1992  . right shoulder replacement      01/27/2006  . SCROTAL EXPLORATION Right 02/26/2017   Procedure: SCROTUM EXPLORATION;  Surgeon: Nickie Retort, MD;  Location: [facility] ORS;  Service: Urology;  Laterality: Right;  . TEE WITHOUT CARDIOVERSION N/A 09/20/2018   Procedure: TRANSESOPHAGEAL ECHOCARDIOGRAM (TEE);  Surgeon: Josue Hector, MD;  Location: [facility] ENDOSCOPY;  Service: Cardiovascular;  Laterality: N/A;  . TOTAL KNEE ARTHROPLASTY Left 07/13/2015   Procedure: LEFT TOTAL KNEE ARTHROPLASTY;  Surgeon: Gaynelle Arabian, MD;  Location: [facility] ORS;  Service: Orthopedics;  Laterality: Left;    Social History:  reports that he quit smoking about 35 years ago. His smoking use included cigarettes. He has a 10.00 pack-year smoking history. He has never used smokeless tobacco. He reports that he does not drink alcohol or use drugs.  Allergies  Allergen Reactions  . Succinylsulphathiazole Rash  . Sulfamethoxazole-Trimethoprim Rash  . Tetracyclines & Related Rash    Family History  Problem Relation Age of Onset  . Cancer Mother        pancreatic  . Diabetes Mother   . Stroke Mother   .  Heart disease Mother   . Hyperlipidemia Mother   . Hypertension Mother   . Heart attack Mother   . Heart disease Father   . Stroke Father   . Diabetes Father   . Hypertension Father   . Heart attack Father   . Hyperlipidemia Father   . Pancreatic cancer Father   . Cancer Sister   . Cancer Brother        lung  . Cancer Brother   . Kidney cancer Neg Hx   . Bladder Cancer Neg Hx   . Prostate cancer Neg Hx      Prior to Admission medications   Medication Sig Start Date End Date Taking? Authorizing Provider  amitriptyline (ELAVIL) 25 MG tablet Take 1 tablet (25 mg total) by  mouth at bedtime. 06/21/19  Yes [name], Henrine Screws T, NP  aspirin EC 81 MG EC tablet Take 1 tablet (81 mg total) by mouth daily. 02/11/19  Yes Debbe Odea, MD  atorvastatin (LIPITOR) 40 MG tablet Take 1 tablet (40 mg total) by mouth at bedtime. 06/21/19  Yes [name], Jolene T, NP  benazepril (LOTENSIN) 40 MG tablet Take 1 tablet (40 mg total) by mouth daily. 06/21/19  Yes [name], Jolene T, NP  cetirizine (ZYRTEC) 10 MG tablet Take 10 mg by mouth daily.   Yes [provider]  Cholecalciferol (VITAMIN D) 2000 units CAPS Take 2,000 Units by mouth daily.   Yes [provider]  empagliflozin (JARDIANCE) 10 MG TABS tablet Take 10 mg by mouth daily before breakfast. 07/20/19  Yes [name], Jolene T, NP  fluticasone (FLONASE) 50 MCG/ACT nasal spray Place 1 spray into the nose 2 (two) times daily as needed for congestion. 08/26/19  Yes [provider]  Magnesium 250 MG TABS Take 250 mg by mouth at bedtime.    Yes [provider]  metFORMIN (GLUCOPHAGE) 500 MG tablet Take 2 tablets (1,000 mg total) by mouth 2 (two) times daily with a meal. Patient taking differently: Take 500 mg by mouth 2 (two) times daily with a meal. 1 tab twice a day 06/21/19  Yes [name], Jolene T, NP  Multiple Vitamins-Minerals (MULTIVITAMIN PO) Take 1 tablet by mouth daily.    Yes [provider]  valACYclovir  (VALTREX) 500 MG tablet Take 1 tablet (500 mg total) by mouth daily. 07/18/19  Yes [name], Jolene T, NP  glucose blood test strip 1 each by Other route 2 (two) times daily. DX E11.9 12/30/18   Johnson, Megan P, DO  Lancets [facility] ULTRASOFT) lancets 1 each by Other route 2 (two) times daily. Dx E11.9 04/24/15   Arlis Porta., MD    Physical Exam: Vitals:   09/07/19 1300 09/07/19 1345 09/07/19 1600 09/07/19 1826  BP: 110/70 115/67 116/68 133/81  Pulse: 78 76 80 81  Resp: (!) 28 (!) 23 (!) 28 (!) 27  Temp:      TempSrc:      SpO2: 97% 97% 100% 96%   Constitutional: Resting supine in bed, NAD, calm, comfortable Eyes: PERRL, EOMI, lids and conjunctivae normal ENMT: Mucous membranes are dry. Posterior pharynx clear of any exudate or lesions.Normal dentition.  Neck: normal, supple, no masses. Respiratory: clear to auscultation bilaterally, no wheezing, no crackles. Normal respiratory effort. No accessory muscle use.  Cardiovascular: Regular rate and rhythm, no murmurs / rubs / gallops. No extremity edema. 2+ pedal pulses. Abdomen: no tenderness, no masses palpated. No hepatosplenomegaly. Bowel sounds positive.  Musculoskeletal: no clubbing / cyanosis. No joint deformity upper and lower extremities. Good ROM, no contractures. Normal muscle tone.  Skin: no rashes, lesions, ulcers. No induration Neurologic: CN 2-12 grossly intact. Sensation intact, Strength 5/5 in all 4.  FTN intact LUE, limited RUE due to understanding?[name] Kitchen Psychiatric: Awake and alert, oriented to self, place, but not year.  Can tell me his wife's name.  Unable to provide relevant history to current situation.  Labs on Admission: I have personally reviewed following labs and imaging studies  CBC: Recent Labs  Lab 09/07/19 1121 09/07/19 1145  WBC 8.2  --   NEUTROABS 7.0  --   HGB 13.8 13.3  HCT 44.1 39.0  MCV 97.8  --   PLT 142*  --    Basic Metabolic Panel: Recent Labs  Lab 09/07/19 1121 09/07/19 1145  NA  134* 132*  K 5.0 5.1  CL 101 102  CO2 18*  --   GLUCOSE 161* 160*  BUN 25* 29*  CREATININE 1.29* 1.20  CALCIUM 8.0*  --    GFR: CrCl cannot be calculated (Unknown ideal weight.). Liver Function Tests: Recent Labs  Lab 09/07/19 1121  AST 52*  ALT 36  ALKPHOS 52  BILITOT 1.5*  PROT 6.1*  ALBUMIN 3.2*   No results for input(s): LIPASE, AMYLASE in the last 168 hours. No results for input(s): AMMONIA in the last 168 hours. Coagulation Profile: Recent Labs  Lab 09/07/19 1121  INR 1.0   Cardiac Enzymes: No results for input(s): CKTOTAL, CKMB, CKMBINDEX, TROPONINI in the last 168 hours. BNP (last 3 results) No results for input(s): PROBNP in the last 8760 hours. HbA1C: No results for input(s): HGBA1C in the last 72 hours. CBG: Recent Labs  Lab 09/07/19 1014  GLUCAP 185*   Lipid Profile: No results for input(s): CHOL, HDL, LDLCALC, TRIG, CHOLHDL, LDLDIRECT in the last 72 hours. Thyroid Function Tests: No results for input(s): TSH, T4TOTAL, FREET4, T3FREE, THYROIDAB in the last 72 hours. Anemia Panel: No results for input(s): VITAMINB12, FOLATE, FERRITIN, TIBC, IRON, RETICCTPCT in the last 72 hours. Urine analysis:    Component Value Date/Time   COLORURINE YELLOW 09/07/2019 1135   APPEARANCEUR CLEAR 09/07/2019 1135   APPEARANCEUR Clear 07/21/2017 0843   LABSPEC 1.028 09/07/2019 1135   PHURINE 5.0 09/07/2019 1135   GLUCOSEU >=500 (A) 09/07/2019 1135   HGBUR NEGATIVE 09/07/2019 1135   BILIRUBINUR NEGATIVE 09/07/2019 1135   BILIRUBINUR Negative 07/21/2017 0843   KETONESUR 80 (A) 09/07/2019 1135   PROTEINUR NEGATIVE 09/07/2019 1135   UROBILINOGEN 0.2 07/11/2012 1443   NITRITE NEGATIVE 09/07/2019 1135   LEUKOCYTESUR NEGATIVE 09/07/2019 1135    Radiological Exams on Admission: CT HEAD WO CONTRAST  Result Date: 09/07/2019 CLINICAL DATA:  Ataxia today.  Possible stroke. EXAM: CT HEAD WITHOUT CONTRAST TECHNIQUE: Contiguous axial images were obtained from the base of  the skull through the vertex without intravenous contrast. COMPARISON:  Brain MRI 02/09/2019. FINDINGS: Brain: No evidence of acute infarction, hemorrhage, hydrocephalus, extra-axial collection or mass lesion/mass effect. Atrophy, extensive chronic microvascular ischemic change and remote infarcts in the anterior and posterior watershed territories on the left again seen. Vascular: No hyperdense vessel or unexpected calcification. Skull: Intact.  No focal lesion. Sinuses/Orbits: Status post cataract surgery.  Otherwise negative. Other: None. IMPRESSION: No acute intracranial normality. Extensive chronic microvascular ischemic change remote infarcts on the left as above. Electronically Signed   By: Inge Rise M.D.   On: 09/07/2019 12:06   MR Brain Wo Contrast (neuro protocol)  Addendum Date: 09/07/2019   ADDENDUM REPORT: 09/07/2019 17:31 ADDENDUM: Please note diffusion weighted signal abnormality was present at the site of the 8 mm focus of restricted diffusion demonstrated on the current MRI. On the current exam, findings may reflect persistent restricted diffusion at site of a chronic infarct. However, a small focus of acute ischemia and acute infarction cannot be excluded. Addended findings discussed with Dr. Rory Percy by telephone at the 5:25 p.m. on 09/07/2019 at 5:29 pm. Electronically Signed   By: Kellie Simmering DO   On: 09/07/2019 17:31   Result Date: 09/07/2019 CLINICAL DATA:  Ataxia, stroke suspected. Additional history provided: Recent speech difficulty, history of stroke in August of last year. EXAM: MRI HEAD WITHOUT CONTRAST TECHNIQUE: Multiplanar, multiecho pulse sequences of the brain and surrounding structures were obtained without intravenous contrast. COMPARISON:  Head  CT 09/07/2019, brain MRI 02/09/2019 FINDINGS: Brain: Multiple sequences are significantly motion degraded, limiting evaluation. Most notably, there is moderate/severe motion degradation of the sagittal T1 weighted sequence,  moderate/severe motion degradation of the coronal T2 weighted sequence and moderate motion degradation of the axial T1 weighted sequence. There is also mild-to-moderate d motion degradation of the axial diffusion-weighted sequence. There is an 8 mm focus of restricted diffusion consistent with acute infarction within the left parietal white matter (series 2, image 32). Redemonstrated chronic cortically based infarcts within the left frontoparietal operculum and left temporoparietal junction as well as portions of the left occipital lobe. Additional small chronic cortically based infarct within the anterolateral left frontal lobe. As before, there is chronic hemosiderin deposition associated with infarcts in the anterior left frontal lobe and left temporoparietal junction. Redemonstrated small chronic lacunar infarct within the right lentiform nucleus. A small chronic lacunar infarct within the left lentiform nucleus was not present on prior MRI. Background advanced chronic small vessel ischemic disease is unchanged. Stable, mild generalized parenchymal atrophy. Vascular: Flow voids maintained within the proximal large arterial vessels. Skull and upper cervical spine: No focal marrow lesion is identified within the limitations of motion degradation. Sinuses/Orbits: Visualized orbits demonstrate no acute abnormality. Mild ethmoid sinus mucosal thickening. No significant mastoid effusion. IMPRESSION: 1. Significantly motion degraded examination as described. 2. 8 mm acute infarct within the left parietal white matter. 3. Redemonstration of multiple chronic cortically based infarcts within the left cerebral hemisphere. 4. A chronic lacunar infarct within the left lentiform nucleus is new from MRI 02/09/2019. Chronic right lentiform nucleus lacunar infarct and background advanced chronic small vessel ischemic disease, unchanged. 5. Stable, mild generalized parenchymal atrophy. Electronically Signed: By: Kellie Simmering DO  On: 09/07/2019 16:16    EKG: Independently reviewed. Normal sinus rhythm, motion artifact present.  Assessment/Plan Principal Problem:   Altered mental status Active Problems:   OSA (obstructive sleep apnea)   Hypertension associated with diabetes ([place])   Uncontrolled type 2 diabetes mellitus with hyperglycemia, without long-term current use of insulin ([place])   Coronary artery disease   Hyperlipidemia associated with type 2 diabetes mellitus ([place])   History of CVA (cerebrovascular accident)  Andrew Cobb is a 69 y.o. male with medical history significant for history of stroke with residual speech deficit and hemorrhagic conversion of ischemic stroke, CAD s/p PCI, type 2 diabetes, hypertension, hyperlipidemia, and OSA on CPAP who is admitted for change in mental status/functional status.  Altered mental status/change in functional status: MRI brain shows area of restricted diffusion in the left parietal white matter, question of residual change from chronic infarct versus small acute infarct.  Neurology following and feel these changes are unlikely responsible for his current symptoms but concerned about seizure activity. -S/p IV Keppra 1.5 g load, now oral Keppra 500 mg twice daily -EEG completed, read pending -Continue seizure precautions -Continue neurochecks -Neurology to follow, appreciate further recommendations  History of CVA with residual speech deficit and hemorrhagic conversion of ischemic stroke: MRI brain findings as above.  Continue aspirin 81 mg and atorvastatin 40 mg daily.  AKI: Suspect prerenal from decreased oral intake.  Continue IV fluid hydration overnight.  Hold benazepril.  CAD s/p PCI: Chronic and stable.  Continue aspirin and statin.  Hypertension: Holding benazepril due to AKI.  Type 2 diabetes: Holding Metformin and empagliflozin while in hospital.  Continue sensitive SSI.  Hyperlipidemia: Continue atorvastatin.  OSA: Continue CPAP nightly as  tolerated.  DVT prophylaxis: Lovenox Code Status: DNR, confirmed  with spouse by phone Family Communication: Discussed with spouse Andrew Cobb by phone [phone] Disposition Plan: From home.  Discharge pending EEG, improvement in mental status and functional status towards baseline, PT/OT/SLP eval, and further neurology input. Consults called: Neurology Admission status: Observation   Zada Finders MD [facility]  If 7PM-7AM, please contact night-coverage [email or web address]  09/07/2019, 7:02 PM

## 2019-09-07 NOTE — ED Notes (Signed)
Pt asked for ice. Informed Kim - RN.

## 2019-09-07 NOTE — Progress Notes (Signed)
EEG complete - results pending 

## 2019-09-07 NOTE — ED Notes (Signed)
Pt conscious, breathing, and A&Ox4. No distress noted. Medications given and charted per [facility]. Will continue to monitor.

## 2019-09-07 NOTE — ED Notes (Signed)
Patient transported to MRI 

## 2019-09-07 NOTE — ED Notes (Signed)
Patient transported to CT 

## 2019-09-07 NOTE — Consult Note (Addendum)
Neurology Consultation  Reason for Consult: Altered mental status Referring Physician: Dr Stark Jock, EDP  CC: Altered mental status  History is obtained from: Wife, chart  HPI: Andrew Cobb is a 69 y.o. 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 The wife reports that he has recovered pretty well from his strokes last year, was driving and is able to do most of his ADLs himself but for the past 3 to 4 days appears more confused and unable to do simple tasks such as operating remote control and carry out a conversation.  She says that he has been having trouble just sitting by himself-he spaces out and stares away in space.  No seizure activity reported.  No foaming of the mouth.  No bowel bladder incontinence. No recent illnesses or sicknesses.  No lateralized weakness that he is reporting. He last saw outpatient neurology June 01, 2019, and was recommended to continue aspirin and atorvastatin.  He had been evaluated for some syncopal events but since none had happened for a while, he was cleared to slowly return to driving short distances with graduated return to driving normally.  He had been driving up until 3 or 4 days ago according to the wife. He has a loop recorder in place because the stroke etiology was cryptogenic and although inpatient work-up was unremarkable for knowing the etiology.  In the emergency room, he had a repeat MRI today which showed a very small area of restricted diffusion-I spoke with the neuroradiologist and that area is either persistent restricted diffusion from the prior stroke or artifactual-it is in the area of the hemorrhagic conversion and it is less likely that that is a new stroke and a stroke of substantial size that might cause his current  presentation.  Wife also reports that he has been having decreased oxygen levels-that is not something that he has had at baseline.  ROS: Pertinent positives in the HPI.  Rest of the review negative.  Past Medical History:  Diagnosis Date  . Arthritis   . Asthma   . Cancer [facility])    prostate  . Chronic venous insufficiency    varicose vein lower extremity with inflammation  . Coronary artery disease 1996   two stents placed   . Diabetes mellitus without complication ([place])    type 2 on metformin  . GERD (gastroesophageal reflux disease)    no issues since gastric bypass surgery as stated per pt  . Hyperlipidemia   . Hypogonadism in male   . MRSA (methicillin resistant Staphylococcus aureus) infection    07/30/2008 thru 08/07/2008  . Sleep apnea    on BIPAP  . Stented coronary artery   . Stroke ([place])   . Thrombocythemia ([place])    Family History  Problem Relation Age of Onset  . Cancer Mother        pancreatic  . Diabetes Mother   . Stroke Mother   . Heart disease Mother   . Hyperlipidemia Mother   . Hypertension Mother   . Heart attack Mother   . Heart disease Father   . Stroke Father   . Diabetes Father   . Hypertension Father   . Heart attack Father   . Hyperlipidemia Father   . Pancreatic cancer Father   . Cancer Sister   . Cancer  Brother        lung  . Cancer Brother   . Kidney cancer Neg Hx   . Bladder Cancer Neg Hx   . Prostate cancer Neg Hx    Social History:   reports that he quit smoking about 35 years ago. His smoking use included cigarettes. He has a 10.00 pack-year smoking history. He has never used smokeless tobacco. He reports that he does not drink alcohol or use drugs.  Medications No current facility-administered medications for this encounter.  Current Outpatient Medications:  .  amitriptyline (ELAVIL) 25 MG tablet, Take 1 tablet (25 mg total) by mouth at bedtime., Disp: 90 tablet, Rfl: 3 .  aspirin EC 81 MG EC tablet, Take 1 tablet (81 mg  total) by mouth daily., Disp: 30 tablet, Rfl: 0 .  atorvastatin (LIPITOR) 40 MG tablet, Take 1 tablet (40 mg total) by mouth at bedtime., Disp: 90 tablet, Rfl: 2 .  benazepril (LOTENSIN) 40 MG tablet, Take 1 tablet (40 mg total) by mouth daily., Disp: 90 tablet, Rfl: 3 .  cetirizine (ZYRTEC) 10 MG tablet, Take 10 mg by mouth daily., Disp: , Rfl:  .  Cholecalciferol (VITAMIN D) 2000 units CAPS, Take 2,000 Units by mouth daily., Disp: , Rfl:  .  empagliflozin (JARDIANCE) 10 MG TABS tablet, Take 10 mg by mouth daily before breakfast., Disp: 90 tablet, Rfl: 3 .  fluticasone (FLONASE) 50 MCG/ACT nasal spray, Place 1 spray into the nose 2 (two) times daily as needed for congestion., Disp: , Rfl:  .  Magnesium 250 MG TABS, Take 250 mg by mouth at bedtime. , Disp: , Rfl:  .  metFORMIN (GLUCOPHAGE) 500 MG tablet, Take 2 tablets (1,000 mg total) by mouth 2 (two) times daily with a meal. (Patient taking differently: Take 500 mg by mouth 2 (two) times daily with a meal. 1 tab twice a day), Disp: 360 tablet, Rfl: 2 .  Multiple Vitamins-Minerals (MULTIVITAMIN PO), Take 1 tablet by mouth daily. , Disp: , Rfl:  .  valACYclovir (VALTREX) 500 MG tablet, Take 1 tablet (500 mg total) by mouth daily., Disp: 90 tablet, Rfl: 2 .  glucose blood test strip, 1 each by Other route 2 (two) times daily. DX E11.9, Disp: 100 each, Rfl: 12 .  Lancets (ONETOUCH ULTRASOFT) lancets, 1 each by Other route 2 (two) times daily. Dx E11.9, Disp: 100 each, Rfl: 12  Exam: Current vital signs: BP 116/68   Pulse 80   Temp 97.8 F (36.6 C) (Oral)   Resp (!) 28   SpO2 100%  Vital signs in last 24 hours: Temp:  [97.8 F (36.6 C)] 97.8 F (36.6 C) (03/03 1006) Pulse Rate:  [75-92] 80 (03/03 1600) Resp:  [16-28] 28 (03/03 1600) BP: (110-120)/(65-86) 116/68 (03/03 1600) SpO2:  [92 %-100 %] 100 % (03/03 1600) General: Awake alert in no distress HEENT: Normocephalic atraumatic Chest clear to auscultation Cardiovascular-regular rate  rhythm Neurological exam Awake alert oriented x3 Speech is not dysarthric Mildly slowed respond to questions but responds to all questions appropriately and follows all commands without any evidence of aphasia. Cranial nerves: Pupils equal round react to light, extraocular movements intact, visual fields full, face symmetric, facial sensation intact, tongue and palate midline. Motor exam: 5/5 without drift in any of the 4 extremities Sensory exam intact to light touch Gait testing deferred at this time Coordination intact on both upper and lower extremities.  Labs I have reviewed labs in epic and the results pertinent to this consultation  are:  CBC    Component Value Date/Time   WBC 8.2 09/07/2019 1121   RBC 4.51 09/07/2019 1121   HGB 13.3 09/07/2019 1145   HGB 14.0 06/21/2019 1459   HCT 39.0 09/07/2019 1145   HCT 42.1 06/21/2019 1459   PLT 142 (L) 09/07/2019 1121   PLT 156 06/21/2019 1459   MCV 97.8 09/07/2019 1121   MCV 96 06/21/2019 1459   MCV 89 07/29/2011 1102   MCH 30.6 09/07/2019 1121   MCHC 31.3 09/07/2019 1121   RDW 13.9 09/07/2019 1121   RDW 11.4 (L) 06/21/2019 1459   RDW 13.8 07/29/2011 1102   LYMPHSABS 0.7 09/07/2019 1121   LYMPHSABS 1.3 06/21/2019 1459   LYMPHSABS 1.4 07/29/2011 1102   MONOABS 0.3 09/07/2019 1121   MONOABS 0.4 07/29/2011 1102   EOSABS 0.0 09/07/2019 1121   EOSABS 0.2 06/21/2019 1459   EOSABS 0.2 07/29/2011 1102   BASOSABS 0.0 09/07/2019 1121   BASOSABS 0.0 06/21/2019 1459   BASOSABS 0.0 07/29/2011 1102    CMP     Component Value Date/Time   NA 132 (L) 09/07/2019 1145   NA 139 06/21/2019 1459   NA 140 07/29/2011 1102   K 5.1 09/07/2019 1145   K 3.8 07/29/2011 1102   CL 102 09/07/2019 1145   CL 102 07/29/2011 1102   CO2 18 (L) 09/07/2019 1121   CO2 29 07/29/2011 1102   GLUCOSE 160 (H) 09/07/2019 1145   GLUCOSE 165 (H) 07/29/2011 1102   BUN 29 (H) 09/07/2019 1145   BUN 21 06/21/2019 1459   BUN 18 07/29/2011 1102   CREATININE  1.20 09/07/2019 1145   CREATININE 0.99 07/29/2011 1102   CALCIUM 8.0 (L) 09/07/2019 1121   CALCIUM 8.5 07/29/2011 1102   PROT 6.1 (L) 09/07/2019 1121   PROT 6.6 06/21/2019 1459   PROT 7.1 07/29/2011 1102   ALBUMIN 3.2 (L) 09/07/2019 1121   ALBUMIN 4.6 06/21/2019 1459   ALBUMIN 4.0 07/29/2011 1102   AST 52 (H) 09/07/2019 1121   AST 36 01/25/2018 0815   AST 57 (H) 07/29/2011 1102   ALT 36 09/07/2019 1121   ALT 52 (H) 01/25/2018 0815   ALT 88 (H) 07/29/2011 1102   ALKPHOS 52 09/07/2019 1121   ALKPHOS 51 07/29/2011 1102   BILITOT 1.5 (H) 09/07/2019 1121   BILITOT 0.6 06/21/2019 1459   BILITOT 0.5 07/29/2011 1102   GFRNONAA 57 (L) 09/07/2019 1121   GFRNONAA >60 07/29/2011 1102   GFRAA >60 09/07/2019 1121   GFRAA >60 07/29/2011 1102    Imaging I have reviewed the images obtained:  MRI examination of the brain with a small area of restricted diffusion in the left parietal white matter-likely persistent diffusion anomaly from the prior stroke or cannot rule out a new stroke  Assessment:  Andrew Cobb is a 69 year old man with past medical history as above 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 MRI imaging shows a small area of restricted diffusion in the left parietal white matter that overlies the area of the prior stroke and the hemorrhagic transformation and I do not think is responsible for any of his current symptoms. I would be more worried about seizure activity due to the cortical involvement from his prior stroke and episodic nature of his spells of altered mental status including staring spells. Other differentials - new stroke (less likely to be symptomacitc) vs recrudescence.  Also toxic metabolic enceph (hyponatremia, mild AKI)  Impression: Evaluate for seizures History of strokes - eval for recrudescence Toxic metabolic  encephalopathy   Recommendations: Continue with aspirin for stroke prevention, continue statin. Load with Keppra 1.5 g IV now and follow at 500 mg twice daily. Obtain EEG in the morning. Maintain seizure precautions Correct toxic metabolic derangements Neurology will follow. Plan discussed with ED provider. -- Amie Portland, MD [facility] Pager: [phone] If 7pm to 7am, please call on call as listed on AMION.

## 2019-09-08 ENCOUNTER — Encounter (HOSPITAL_COMMUNITY): Payer: Self-pay | Admitting: Family Medicine

## 2019-09-08 ENCOUNTER — Observation Stay (HOSPITAL_COMMUNITY): Payer: PPO

## 2019-09-08 DIAGNOSIS — I69328 Other speech and language deficits following cerebral infarction: Secondary | ICD-10-CM | POA: Diagnosis not present

## 2019-09-08 DIAGNOSIS — E871 Hypo-osmolality and hyponatremia: Secondary | ICD-10-CM | POA: Diagnosis present

## 2019-09-08 DIAGNOSIS — Z20822 Contact with and (suspected) exposure to covid-19: Secondary | ICD-10-CM | POA: Diagnosis present

## 2019-09-08 DIAGNOSIS — J9601 Acute respiratory failure with hypoxia: Secondary | ICD-10-CM | POA: Diagnosis not present

## 2019-09-08 DIAGNOSIS — Z9049 Acquired absence of other specified parts of digestive tract: Secondary | ICD-10-CM | POA: Diagnosis not present

## 2019-09-08 DIAGNOSIS — Z955 Presence of coronary angioplasty implant and graft: Secondary | ICD-10-CM | POA: Diagnosis not present

## 2019-09-08 DIAGNOSIS — E1165 Type 2 diabetes mellitus with hyperglycemia: Secondary | ICD-10-CM | POA: Diagnosis present

## 2019-09-08 DIAGNOSIS — E785 Hyperlipidemia, unspecified: Secondary | ICD-10-CM | POA: Diagnosis present

## 2019-09-08 DIAGNOSIS — J9691 Respiratory failure, unspecified with hypoxia: Secondary | ICD-10-CM | POA: Diagnosis not present

## 2019-09-08 DIAGNOSIS — Z87891 Personal history of nicotine dependence: Secondary | ICD-10-CM | POA: Diagnosis not present

## 2019-09-08 DIAGNOSIS — R4182 Altered mental status, unspecified: Secondary | ICD-10-CM | POA: Diagnosis present

## 2019-09-08 DIAGNOSIS — Z8546 Personal history of malignant neoplasm of prostate: Secondary | ICD-10-CM | POA: Diagnosis not present

## 2019-09-08 DIAGNOSIS — E1159 Type 2 diabetes mellitus with other circulatory complications: Secondary | ICD-10-CM | POA: Diagnosis not present

## 2019-09-08 DIAGNOSIS — J189 Pneumonia, unspecified organism: Secondary | ICD-10-CM | POA: Diagnosis not present

## 2019-09-08 DIAGNOSIS — Z96653 Presence of artificial knee joint, bilateral: Secondary | ICD-10-CM | POA: Diagnosis present

## 2019-09-08 DIAGNOSIS — I152 Hypertension secondary to endocrine disorders: Secondary | ICD-10-CM | POA: Diagnosis present

## 2019-09-08 DIAGNOSIS — G92 Toxic encephalopathy: Secondary | ICD-10-CM | POA: Diagnosis present

## 2019-09-08 DIAGNOSIS — R569 Unspecified convulsions: Secondary | ICD-10-CM | POA: Diagnosis present

## 2019-09-08 DIAGNOSIS — J690 Pneumonitis due to inhalation of food and vomit: Secondary | ICD-10-CM | POA: Diagnosis present

## 2019-09-08 DIAGNOSIS — E1169 Type 2 diabetes mellitus with other specified complication: Secondary | ICD-10-CM | POA: Diagnosis present

## 2019-09-08 DIAGNOSIS — G9340 Encephalopathy, unspecified: Secondary | ICD-10-CM | POA: Diagnosis present

## 2019-09-08 DIAGNOSIS — G4733 Obstructive sleep apnea (adult) (pediatric): Secondary | ICD-10-CM | POA: Diagnosis present

## 2019-09-08 DIAGNOSIS — Z8614 Personal history of Methicillin resistant Staphylococcus aureus infection: Secondary | ICD-10-CM | POA: Diagnosis not present

## 2019-09-08 DIAGNOSIS — K219 Gastro-esophageal reflux disease without esophagitis: Secondary | ICD-10-CM | POA: Diagnosis present

## 2019-09-08 DIAGNOSIS — I2510 Atherosclerotic heart disease of native coronary artery without angina pectoris: Secondary | ICD-10-CM | POA: Diagnosis present

## 2019-09-08 DIAGNOSIS — Z833 Family history of diabetes mellitus: Secondary | ICD-10-CM | POA: Diagnosis not present

## 2019-09-08 DIAGNOSIS — N179 Acute kidney failure, unspecified: Secondary | ICD-10-CM | POA: Diagnosis present

## 2019-09-08 DIAGNOSIS — Z96611 Presence of right artificial shoulder joint: Secondary | ICD-10-CM | POA: Diagnosis present

## 2019-09-08 DIAGNOSIS — Z66 Do not resuscitate: Secondary | ICD-10-CM | POA: Diagnosis present

## 2019-09-08 LAB — COMPREHENSIVE METABOLIC PANEL
ALT: 36 U/L (ref 0–44)
AST: 50 U/L — ABNORMAL HIGH (ref 15–41)
Albumin: 2.8 g/dL — ABNORMAL LOW (ref 3.5–5.0)
Alkaline Phosphatase: 51 U/L (ref 38–126)
Anion gap: 12 (ref 5–15)
BUN: 25 mg/dL — ABNORMAL HIGH (ref 8–23)
CO2: 22 mmol/L (ref 22–32)
Calcium: 7.9 mg/dL — ABNORMAL LOW (ref 8.9–10.3)
Chloride: 102 mmol/L (ref 98–111)
Creatinine, Ser: 1.18 mg/dL (ref 0.61–1.24)
GFR calc Af Amer: >60 mL/min (ref >60)
GFR calc non Af Amer: >60 mL/min (ref >60)
Glucose, Bld: 127 mg/dL — ABNORMAL HIGH (ref 70–99)
Potassium: 4.7 mmol/L (ref 3.5–5.1)
Sodium: 136 mmol/L (ref 135–145)
Total Bilirubin: 1.7 mg/dL — ABNORMAL HIGH (ref 0.3–1.2)
Total Protein: 5.7 g/dL — ABNORMAL LOW (ref 6.5–8.1)

## 2019-09-08 LAB — HEMOGLOBIN A1C
Hgb A1c MFr Bld: 9.2 % — ABNORMAL HIGH (ref 4.8–5.6)
Mean Plasma Glucose: 217.34 mg/dL

## 2019-09-08 LAB — RESPIRATORY PANEL BY PCR

## 2019-09-08 LAB — LIPID PANEL
Cholesterol: 90 mg/dL (ref 0–200)
HDL: 35 mg/dL — ABNORMAL LOW (ref >40)
LDL Cholesterol: 40 mg/dL (ref 0–99)
Total CHOL/HDL Ratio: 2.6 RATIO
Triglycerides: 73 mg/dL (ref <150)
VLDL: 15 mg/dL (ref 0–40)

## 2019-09-08 LAB — D-DIMER, QUANTITATIVE: D-Dimer, Quant: 0.94 ug/mL-FEU — ABNORMAL HIGH (ref 0.00–0.50)

## 2019-09-08 LAB — FERRITIN: Ferritin: 892 ng/mL — ABNORMAL HIGH (ref 24–336)

## 2019-09-08 LAB — SEDIMENTATION RATE: Sed Rate: 39 mm/hr — ABNORMAL HIGH (ref 0–16)

## 2019-09-08 LAB — PROCALCITONIN: Procalcitonin: <0.1 ng/mL

## 2019-09-08 LAB — POC SARS CORONAVIRUS 2 AG -  ED: SARS Coronavirus 2 Ag: NEGATIVE

## 2019-09-08 MED ORDER — PIPERACILLIN-TAZOBACTAM 3.375 G IVPB 30 MIN
3.3750 g | Freq: Three times a day (TID) | INTRAVENOUS | Status: AC
  Administered 2019-09-08 – 2019-09-12 (×14): 3.375 g via INTRAVENOUS
  Filled 2019-09-08 (×25): qty 50

## 2019-09-08 MED ORDER — ORAL CARE MOUTH RINSE
15.0000 mL | Freq: Two times a day (BID) | OROMUCOSAL | Status: DC
  Administered 2019-09-09 – 2019-09-15 (×12): 15 mL via OROMUCOSAL

## 2019-09-08 NOTE — ED Notes (Signed)
Pt resting in bed. Pt denies new or worsening complaints. Will continue to monitor. No distress noted. Pt on continuous monitoring via blood pressure, pulse ox, and cardiac monitor.  

## 2019-09-08 NOTE — ED Notes (Signed)
Neurology at bedside.

## 2019-09-08 NOTE — ED Notes (Signed)
Patient transported to X-ray 

## 2019-09-08 NOTE — Plan of Care (Signed)
  Problem: Education: Goal: Knowledge of General Education information will improve Description: Including pain rating scale, medication(s)/side effects and non-pharmacologic comfort measures Outcome: Progressing   Problem: Health Behavior/Discharge Planning: Goal: Ability to manage health-related needs will improve Outcome: Progressing   Problem: Clinical Measurements: Goal: Will remain free from infection Outcome: Progressing Goal: Diagnostic test results will improve Outcome: Progressing Goal: Respiratory complications will improve Outcome: Progressing   Problem: Activity: Goal: Risk for activity intolerance will decrease Outcome: Progressing   Problem: Nutrition: Goal: Adequate nutrition will be maintained Outcome: Progressing   Problem: Elimination: Goal: Will not experience complications related to bowel motility Outcome: Progressing Goal: Will not experience complications related to urinary retention Outcome: Progressing   Problem: Safety: Goal: Ability to remain free from injury will improve Outcome: Progressing   Problem: Skin Integrity: Goal: Risk for impaired skin integrity will decrease Outcome: Progressing   Problem: Education: Goal: Knowledge of secondary prevention will improve Outcome: Progressing Goal: Knowledge of patient specific risk factors addressed and post discharge goals established will improve Outcome: Progressing

## 2019-09-08 NOTE — Progress Notes (Signed)
Reason for consult: Seizure/encphalopathy  Subjective: Patient back to baseline    ROS: negative except above   Examination  Vital signs in last 24 hours: Pulse Rate:  [69-89] 78 (03/04 1600) Resp:  [20-43] 20 (03/04 1600) BP: (99-148)/(48-99) 121/83 (03/04 1500) SpO2:  [90 %-100 %] 94 % (03/04 1600) Weight:  [90.7 kg] 90.7 kg (03/04 0816)  General: lying in bed CVS: pulse-normal rate and rhythm RS: breathing comfortably Extremities: normal   Neuro: MS: Alert, oriented, follows commands CN: pupils equal and reactive,  EOMI, face symmetric, tongue midline, normal sensation over face, Motor: 5/5 strength in all 4 extremities plantars: flexor Coordination: normal Gait: not tested  Basic Metabolic Panel: Recent Labs  Lab 09/07/19 1121 09/07/19 1145 09/08/19 1528  NA 134* 132* 136  K 5.0 5.1 4.7  CL 101 102 102  CO2 18*  --  22  GLUCOSE 161* 160* 127*  BUN 25* 29* 25*  CREATININE 1.29* 1.20 1.18  CALCIUM 8.0*  --  7.9*    CBC: Recent Labs  Lab 09/07/19 1121 09/07/19 1145  WBC 8.2  --   NEUTROABS 7.0  --   HGB 13.8 13.3  HCT 44.1 39.0  MCV 97.8  --   PLT 142*  --      Coagulation Studies: Recent Labs    09/07/19 1121  LABPROT 13.5  INR 1.0    Imaging Reviewed: MRI brian  -note diffusion weighted signal abnormality was present at the site of the 8 mm focus of restricted diffusion demonstrated on the current MRI. On the current exam, findings may reflect persistent restricted diffusion at site of a chronic infarct. However, a small focus of acute ischemia and acute infarction cannot be excluded.   EEG:   - Intermittent slow, generalized left posterior temporal region This study showed evidence of cortical dysfunction in left posterior temporal region likely secondary to underlying stroke as well as mild diffuse encephalopathy. No seizures or definite epileptiform discharges were seen throughout the recording.  ASSESSMENT AND  PLAN  69 year old man with past medical history as above recent stroke in August with hemorrhagic transformation who presents for complaints of altered mental status-inability to do things that he was able to do, staring spells. Hospitalist concerned about patient having pneumonia.    ? Seizure vs recrudesce of stroke in the setting of pneumonia.   Patient loaded with Keppra, started on 500mg  BID EEG shows generalized slowing in left posterior temporal region  Reviewed MRI brain- agree with Dr. Rory Percy, likely chronic infarct   Recommendations Continue Keppra 500mg  BID Seizure precuations Treat pneumonia, dehydration  PT/OT eval   Outpatient Neurology follow up in 4 weeks. Neurology will be available as needed.   Per [facility] statutes, patients with seizures are not allowed to drive until they have been seizure-free for six months. Use caution when using heavy equipment or power tools. Avoid working on ladders or at heights. Take showers instead of baths. Ensure the water temperature is not too high on the home water heater. Do not go swimming alone. Do not lock yourself in a room alone (i.e. bathroom). When caring for infants or small children, sit down when holding, feeding, or changing them to minimize risk of injury to the child in the event you have a seizure. Maintain good sleep hygiene. Avoid alcohol.    If Andrew Cobb has another seizure, call 911 and bring them back to the ED if:       A.  The seizure  lasts longer than 5 minutes.            B.  The patient doesn't wake shortly after the seizure or has new problems such as difficulty seeing, speaking or moving following the seizure       C.  The patient was injured during the seizure       D.  The patient has a temperature over 102 F (39C)       E.  The patient vomited during the seizure and now is having trouble breathing    [name] [facility] Pager Number [phone] For questions after 7pm  please refer to AMION to reach the Neurologist on call

## 2019-09-08 NOTE — Progress Notes (Signed)
PROGRESS NOTE    Andrew Cobb  ZJQ:[id number] DOB: 1951-02-24 DOA: 09/07/2019 PCP: Guadalupe Maple, MD   Brief Narrative:   HPI: Andrew Cobb is a 69 y.o. 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  History is limited from patient therefore majority of history is obtained from [place], chart review, and wife by phone.  Patient's wife states that normally patient is very talkative and able to do most of his ADLs including driving.  Over the last 3-4 days he has been appearing more confused, sitting by himself with staring spells, unable to operate the remote control, not conversing as he has before, and with significant decreased oral intake.  She states that he has been walking very slowly.  Patient was brought to the ED for further evaluation.  ED Course:  Initial vitals showed BP 113/66, pulse 85, RR 16, temp 97.8 Fahrenheit, SPO2 92% on room air.  Labs notable for WBC 8.2, hemoglobin 13.8, platelets 142,000, BUN 25, creatinine 1.29, sodium 134, potassium 5.0, bicarb 18, serum glucose 161, AST 52, ALT 36, alk phos 52, total bilirubin 1.5.  Serum ethanol is negative.  UDS is negative.  Urinalysis is negative for UTI.  CT head without contrast showed extensive chronic microvascular ischemic change remote infarcts.  MRI brain with a motion degraded examination.  A questionable acute infarct in the left parietal white matter versus persistent restricted diffusion at the site of a chronic infarct is noted.  Chronic lacunar infarct within the left lentiform nucleus is seen and new compared to prior MRI.  Neurology were consulted due to concern for seizure activity patient was given a load of IV Keppra 1.5 g once and started on oral Keppra 500 mg twice daily.   The hospitalist service was consulted admit for further evaluation management.  Assessment & Plan:   Principal Problem:   Altered mental status Active Problems:   OSA (obstructive sleep apnea)   Hypertension associated with diabetes ([place])   Uncontrolled type 2 diabetes mellitus with hyperglycemia, without long-term current use of insulin ([place])   Coronary artery disease   Hyperlipidemia associated with type 2 diabetes mellitus ([place])   History of CVA (cerebrovascular accident)   Acute encephalopathy   Acute encephalopathy: MRI brain shows area of restricted diffusion in the left parietal white matter, question of residual change from chronic infarct versus small acute infarct.  Neurology following and feel these changes are unlikely responsible for his current symptoms but concerned about seizure activity. -S/p IV Keppra 1.5 g load, now oral Keppra 500 mg twice daily -EEG negative for any seizure activity but positive for encephalopathy. -Continue seizure precautions -Continue neurochecks Patient is still altered.  Could very well be secondary to new diagnosis of pneumonia. -Neurology to follow, appreciate further recommendations  Bilateral patchy pneumonia: Patient was hypoxic this morning when I saw him.  On exam, he had rhonchi at the right and left base.  Obtain chest x-ray which shows bilateral patchy pneumonia.  I am concerned about possible COVID-19 infection.  COVID-19 test was not done.  Will order that as well as respiratory panel.  We will keep him as PUI.  Will check other inflammatory markers as well.  We will start him on Zosyn empirically.  History of CVA with residual speech deficit and hemorrhagic  conversion of ischemic stroke: MRI brain findings as above.  Continue aspirin 81 mg and atorvastatin 40 mg daily.  AKI/dehydration: Suspect prerenal from decreased oral intake.  Continue IV fluid hydration overnight.  Continue to hold benazepril.  Repeat labs today.  CAD  s/p PCI: Chronic and stable.  Continue aspirin and statin.  No symptoms.  Hypertension: Blood pressure fairly controlled but fluctuating. Holding benazepril due to AKI.  Type 2 diabetes: Blood sugar fairly controlled. Holding Metformin and empagliflozin while in hospital.  Continue sensitive SSI.  Hyperlipidemia: Continue atorvastatin.  OSA: Continue CPAP nightly as tolerated.  DVT prophylaxis: Lovenox   Code Status: DNR  Family Communication:  None present at bedside.  Patient is from: Home Disposition Plan: To be determined based on clinical course Barriers to discharge: Hypoxic, acute encephalopathy.  Needs IV antibiotics and further work-up.   Estimated body mass index is 28.7 kg/m as calculated from the following:   Height as of this encounter: '5\' 10"'  (1.778 m).   Weight as of this encounter: 90.7 kg.      Nutritional status:               Consultants:   Neurology  Procedures:   None  Antimicrobials:   Zosyn   Subjective: Seen and examined in the ER earlier.  Patient was alert and oriented to person and place but not time.  Objective: Vitals:   09/08/19 1030 09/08/19 1116 09/08/19 1200 09/08/19 1245  BP: 136/74 113/70 120/73 (!) 129/92  Pulse: 77 69 71 71  Resp: (!) 22 (!) 29 (!) 23 (!) 28  Temp:      TempSrc:      SpO2: 92% 95% 94% 98%  Weight:      Height:        Intake/Output Summary (Last 24 hours) at 09/08/2019 1450 Last data filed at 09/08/2019 8676 Gross per 24 hour  Intake 200 ml  Output --  Net 200 ml   Filed Weights   09/08/19 0816  Weight: 90.7 kg    Examination:  General exam: Appears calm and comfortable  Respiratory system: Bilateral rhonchi respiratory effort normal. Cardiovascular system: S1 & S2 heard, RRR. No JVD, murmurs, rubs, gallops or clicks. No pedal edema. Gastrointestinal system: Abdomen is nondistended, soft and nontender. No organomegaly or masses felt. Normal bowel sounds heard. Central nervous  system: Alert and oriented x2. No focal neurological deficits. Extremities: Symmetric 5 x 5 power. Skin: No rashes, lesions or ulcers     Data Reviewed: I have personally reviewed following labs and imaging studies  CBC: Recent Labs  Lab 09/07/19 1121 09/07/19 1145  WBC 8.2  --   NEUTROABS 7.0  --   HGB 13.8 13.3  HCT 44.1 39.0  MCV 97.8  --   PLT 142*  --    Basic Metabolic Panel: Recent Labs  Lab 09/07/19 1121 09/07/19 1145  NA 134* 132*  K 5.0 5.1  CL 101 102  CO2 18*  --   GLUCOSE 161* 160*  BUN 25* 29*  CREATININE 1.29* 1.20  CALCIUM 8.0*  --    GFR: Estimated Creatinine Clearance: 66.8 mL/min (by C-G formula based on SCr of 1.2 mg/dL). Liver Function Tests: Recent Labs  Lab 09/07/19 1121  AST 52*  ALT 36  ALKPHOS 52  BILITOT 1.5*  PROT 6.1*  ALBUMIN 3.2*   No results for input(s): LIPASE, AMYLASE in the last 168 hours. No results for input(s): AMMONIA in the last 168 hours. Coagulation Profile: Recent  Labs  Lab 09/07/19 1121  INR 1.0   Cardiac Enzymes: No results for input(s): CKTOTAL, CKMB, CKMBINDEX, TROPONINI in the last 168 hours. BNP (last 3 results) No results for input(s): PROBNP in the last 8760 hours. HbA1C: Recent Labs    09/08/19 0350  HGBA1C 9.2*   CBG: Recent Labs  Lab 09/07/19 1014  GLUCAP 185*   Lipid Profile: Recent Labs    09/08/19 0350  CHOL 90  HDL 35*  LDLCALC 40  TRIG 73  CHOLHDL 2.6   Thyroid Function Tests: No results for input(s): TSH, T4TOTAL, FREET4, T3FREE, THYROIDAB in the last 72 hours. Anemia Panel: No results for input(s): VITAMINB12, FOLATE, FERRITIN, TIBC, IRON, RETICCTPCT in the last 72 hours. Sepsis Labs: No results for input(s): PROCALCITON, LATICACIDVEN in the last 168 hours.  No results found for this or any previous visit (from the past 240 hour(s)).    Radiology Studies: DG Chest 2 View  Result Date: 09/08/2019 CLINICAL DATA:  Respiratory failure with hypoxia EXAM: CHEST - 2 VIEW  COMPARISON:  10/09/2018 FINDINGS: Low lung volumes. Patchy bilateral airspace opacities have worsened since prior study. No effusions. Heart is upper limits normal in size. No acute bony abnormality. IMPRESSION: Patchy bilateral airspace disease concerning for pneumonia. Electronically Signed   By: Rolm Baptise M.D.   On: 09/08/2019 08:48   CT HEAD WO CONTRAST  Result Date: 09/07/2019 CLINICAL DATA:  Ataxia today.  Possible stroke. EXAM: CT HEAD WITHOUT CONTRAST TECHNIQUE: Contiguous axial images were obtained from the base of the skull through the vertex without intravenous contrast. COMPARISON:  Brain MRI 02/09/2019. FINDINGS: Brain: No evidence of acute infarction, hemorrhage, hydrocephalus, extra-axial collection or mass lesion/mass effect. Atrophy, extensive chronic microvascular ischemic change and remote infarcts in the anterior and posterior watershed territories on the left again seen. Vascular: No hyperdense vessel or unexpected calcification. Skull: Intact.  No focal lesion. Sinuses/Orbits: Status post cataract surgery.  Otherwise negative. Other: None. IMPRESSION: No acute intracranial normality. Extensive chronic microvascular ischemic change remote infarcts on the left as above. Electronically Signed   By: Inge Rise M.D.   On: 09/07/2019 12:06   MR Brain Wo Contrast (neuro protocol)  Addendum Date: 09/07/2019   ADDENDUM REPORT: 09/07/2019 17:31 ADDENDUM: Please note diffusion weighted signal abnormality was present at the site of the 8 mm focus of restricted diffusion demonstrated on the current MRI. On the current exam, findings may reflect persistent restricted diffusion at site of a chronic infarct. However, a small focus of acute ischemia and acute infarction cannot be excluded. Addended findings discussed with Dr. Rory Percy by telephone at the 5:25 p.m. on 09/07/2019 at 5:29 pm. Electronically Signed   By: Kellie Simmering DO   On: 09/07/2019 17:31   Result Date: 09/07/2019 CLINICAL DATA:   Ataxia, stroke suspected. Additional history provided: Recent speech difficulty, history of stroke in August of last year. EXAM: MRI HEAD WITHOUT CONTRAST TECHNIQUE: Multiplanar, multiecho pulse sequences of the brain and surrounding structures were obtained without intravenous contrast. COMPARISON:  Head CT 09/07/2019, brain MRI 02/09/2019 FINDINGS: Brain: Multiple sequences are significantly motion degraded, limiting evaluation. Most notably, there is moderate/severe motion degradation of the sagittal T1 weighted sequence, moderate/severe motion degradation of the coronal T2 weighted sequence and moderate motion degradation of the axial T1 weighted sequence. There is also mild-to-moderate d motion degradation of the axial diffusion-weighted sequence. There is an 8 mm focus of restricted diffusion consistent with acute infarction within the left parietal white matter (series 2, image 32).  Redemonstrated chronic cortically based infarcts within the left frontoparietal operculum and left temporoparietal junction as well as portions of the left occipital lobe. Additional small chronic cortically based infarct within the anterolateral left frontal lobe. As before, there is chronic hemosiderin deposition associated with infarcts in the anterior left frontal lobe and left temporoparietal junction. Redemonstrated small chronic lacunar infarct within the right lentiform nucleus. A small chronic lacunar infarct within the left lentiform nucleus was not present on prior MRI. Background advanced chronic small vessel ischemic disease is unchanged. Stable, mild generalized parenchymal atrophy. Vascular: Flow voids maintained within the proximal large arterial vessels. Skull and upper cervical spine: No focal marrow lesion is identified within the limitations of motion degradation. Sinuses/Orbits: Visualized orbits demonstrate no acute abnormality. Mild ethmoid sinus mucosal thickening. No significant mastoid effusion.  IMPRESSION: 1. Significantly motion degraded examination as described. 2. 8 mm acute infarct within the left parietal white matter. 3. Redemonstration of multiple chronic cortically based infarcts within the left cerebral hemisphere. 4. A chronic lacunar infarct within the left lentiform nucleus is new from MRI 02/09/2019. Chronic right lentiform nucleus lacunar infarct and background advanced chronic small vessel ischemic disease, unchanged. 5. Stable, mild generalized parenchymal atrophy. Electronically Signed: By: Kellie Simmering DO On: 09/07/2019 16:16   EEG adult  Result Date: 09/08/2019 Lora Havens, MD     09/08/2019 10:26 AM Patient Name: Andrew Cobb MRN: [id number] Epilepsy Attending: Lora Havens Referring Physician/Provider: Dr Amie [place] Date: 09/07/2019 Duration: 22.45 mins Patient history: 69 year old man with 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 EEG to evaluate for seizure. Level of alertness: awake AEDs during EEG study: LEV Technical aspects: This EEG study was done with scalp electrodes positioned according to the 10-20 International system of electrode placement. Electrical activity was acquired at a sampling rate of '500Hz'  and reviewed with a high frequency filter of '70Hz'  and a low frequency filter of '1Hz' . EEG data were recorded continuously and digitally stored. DESCRIPTION: The posterior dominant rhythm consists of 8.'5Hz'  activity of moderate voltage (25-35 uV) seen predominantly in posterior head regions, symmetric and reactive to eye opening and eye closing. Intermittent generalized, maximal left posterior temporal region3-'5hz'  theta-delta slowing was seen.  Hyperventilation and photic stimulation were not performed. ABNORMALITY - Intermittent slow, generalized left posterior temporal region IMPRESSION: This study showed evidence of cortical  dysfunction in left posterior temporal region likely secondary to underlying stroke as well as mild diffuse encephalopathy. No seizures or definite epileptiform discharges were seen throughout the recording. Priyanka Barbra Sarks    Scheduled Meds: .  stroke: mapping our early stages of recovery book   Does not apply Once  . aspirin EC  81 mg Oral Daily  . atorvastatin  40 mg Oral QHS  . enoxaparin (LOVENOX) injection  40 mg Subcutaneous Q24H  . levETIRAcetam  500 mg Oral BID   Continuous Infusions:   LOS: 0 days   Time spent: 37 minutes   Darliss Cheney, MD [facility]  09/08/2019, 2:50 PM   To contact the attending provider between 7A-7P or the covering provider during after hours 7P-7A, please log into the web site [email or web address].

## 2019-09-08 NOTE — Evaluation (Signed)
Physical Therapy Evaluation Patient Details Name: Andrew Cobb MRN: [id number] DOB: 1951-03-05 Today's Date: 09/08/2019   History of Present Illness  Pt is a 69 y/o male admitted secondary to AMS. MRI revealed a questionable acute infarct in the left parietal white matter versus persistent restricted diffusion at the site of a chronic infarct is noted. PMH includes CVA, CAD s/p PCI, DM, HTN, OSA on CPAP, and prostate cancer.   Clinical Impression  Pt admitted secondary to problem above with deficits below. Pt with very slow processing and difficulty sequencing. Was alert and oriented X4. Also with expressive difficulties. Pt requiring min to min guard A for mobility. Pt seen in the ED, so mobility limited. Will need to assess mobility further prior to determining best d/c plan. Will continue to follow acutely to maximize functional mobility independence and safety.     Follow Up Recommendations Other (comment);Supervision/Assistance - 24 hour(TBD)    Equipment Recommendations  None recommended by PT    Recommendations for Other Services       Precautions / Restrictions Precautions Precautions: Fall Restrictions Weight Bearing Restrictions: No      Mobility  Bed Mobility Overal bed mobility: Needs Assistance Bed Mobility: Supine to Sit;Sit to Supine     Supine to sit: Min assist Sit to supine: Min assist   General bed mobility comments: Very slow to process. Multimodal cues for sequencing.   Transfers Overall transfer level: Needs assistance Equipment used: 1 person hand held assist Transfers: Sit to/from Stand Sit to Stand: Min guard         General transfer comment: Min guard for safety. Pt closing eyes, but denies dizziness.   Ambulation/Gait Ambulation/Gait assistance: Min guard Gait Distance (Feet): 10 Feet Assistive device: 1 person hand held assist Gait Pattern/deviations: Step-through pattern;Decreased stride length Gait velocity: Decreased   General  Gait Details: Mildly unsteady gait within the room in the ED. Pt requiring min guard for safety.   Stairs            Wheelchair Mobility    Modified [name] (Stroke Patients Only) Modified [name] (Stroke Patients Only) Pre-Morbid [name] Score: No symptoms Modified [name]: Moderately severe disability     Balance Overall balance assessment: Needs assistance Sitting-balance support: No upper extremity supported;Feet supported Sitting balance-Leahy Scale: Good     Standing balance support: Single extremity supported;During functional activity Standing balance-Leahy Scale: Poor Standing balance comment: Reliant on at least 1 UE support                              Pertinent Vitals/Pain Pain Assessment: No/denies pain    Home Living Family/patient expects to be discharged to:: Private residence Living Arrangements: Spouse/significant other Available Help at Discharge: Family;Available 24 hours/day Type of Home: House Home Access: Stairs to enter Entrance Stairs-Rails: None Entrance Stairs-Number of Steps: 1 Home Layout: One level Home Equipment: Walker - 2 wheels;Shower seat;Walker - 4 wheels;Cane - single point;Bedside commode      Prior Function Level of Independence: Independent               Hand Dominance        Extremity/Trunk Assessment   Upper Extremity Assessment Upper Extremity Assessment: Defer to OT evaluation    Lower Extremity Assessment Lower Extremity Assessment: Generalized weakness    Cervical / Trunk Assessment Cervical / Trunk Assessment: Normal  Communication   Communication: Expressive difficulties;HOH  Cognition Arousal/Alertness: Awake/alert Behavior During Therapy: Flat affect  Overall Cognitive Status: No family/caregiver present to determine baseline cognitive functioning                                 General Comments: Pt with expressive difficulties and very slow to respond to questions       General Comments      Exercises     Assessment/Plan    PT Assessment Patient needs continued PT services  PT Problem List Decreased strength;Decreased balance;Decreased cognition;Decreased knowledge of use of DME;Decreased safety awareness;Decreased knowledge of precautions;Decreased mobility       PT Treatment Interventions Stair training;Gait training;Functional mobility training;Therapeutic activities;Therapeutic exercise;Balance training;Patient/family education    PT Goals (Current goals can be found in the Care Plan section)  Acute Rehab PT Goals Patient Stated Goal: "to get at diet coke"  PT Goal Formulation: With patient Time For Goal Achievement: 09/22/19 Potential to Achieve Goals: Good    Frequency Min 4X/week   Barriers to discharge        Co-evaluation               AM-PAC PT "6 Clicks" Mobility  Outcome Measure Help needed turning from your back to your side while in a flat bed without using bedrails?: A Little Help needed moving from lying on your back to sitting on the side of a flat bed without using bedrails?: A Little Help needed moving to and from a bed to a chair (including a wheelchair)?: A Little Help needed standing up from a chair using your arms (e.g., wheelchair or bedside chair)?: A Little Help needed to walk in hospital room?: A Little Help needed climbing 3-5 steps with a railing? : A Lot 6 Click Score: 17    End of Session Equipment Utilized During Treatment: Gait belt Activity Tolerance: Patient tolerated treatment well Patient left: in bed;with call bell/phone within reach Nurse Communication: Mobility status PT Visit Diagnosis: Unsteadiness on feet (R26.81);Muscle weakness (generalized) (M62.81)    Time: [id number] PT Time Calculation (min) (ACUTE ONLY): 15 min   Charges:   PT Evaluation $PT Eval Moderate Complexity: 1 Mod          Reuel Derby, PT, DPT  Acute Rehabilitation Services  Pager: [phone] Office:  [phone]   Rudean Hitt 09/08/2019, 4:30 PM

## 2019-09-08 NOTE — Procedures (Addendum)
Patient Name: TREON KIRACOFE  MRN: [id number]  Epilepsy Attending: Lora Havens  Referring Physician/Provider: Dr Amie Portland Date: 09/07/2019 Duration: 22.45 mins  Patient history: 69 year old man with 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 EEG to evaluate for seizure.  Level of alertness: awake  AEDs during EEG study: LEV  Technical aspects: This EEG study was done with scalp electrodes positioned according to the 10-20 International system of electrode placement. Electrical activity was acquired at a sampling rate of 500Hz  and reviewed with a high frequency filter of 70Hz  and a low frequency filter of 1Hz . EEG data were recorded continuously and digitally stored.   DESCRIPTION: The posterior dominant rhythm consists of 8.5Hz  activity of moderate voltage (25-35 uV) seen predominantly in posterior head regions, symmetric and reactive to eye opening and eye closing. Intermittent generalized, maximal left posterior temporal region3-5hz  theta-delta slowing was seen.  Hyperventilation and photic stimulation were not performed.  ABNORMALITY - Intermittent slow, generalized left posterior temporal region  IMPRESSION: This study showed evidence of cortical dysfunction in left posterior temporal region likely secondary to underlying stroke as well as mild diffuse encephalopathy. No seizures or definite epileptiform discharges were seen throughout the recording.  Joh Rao Barbra Sarks

## 2019-09-09 DIAGNOSIS — E1159 Type 2 diabetes mellitus with other circulatory complications: Secondary | ICD-10-CM

## 2019-09-09 DIAGNOSIS — E785 Hyperlipidemia, unspecified: Secondary | ICD-10-CM

## 2019-09-09 DIAGNOSIS — G4733 Obstructive sleep apnea (adult) (pediatric): Secondary | ICD-10-CM

## 2019-09-09 DIAGNOSIS — I2510 Atherosclerotic heart disease of native coronary artery without angina pectoris: Secondary | ICD-10-CM

## 2019-09-09 DIAGNOSIS — E1169 Type 2 diabetes mellitus with other specified complication: Secondary | ICD-10-CM

## 2019-09-09 DIAGNOSIS — E1165 Type 2 diabetes mellitus with hyperglycemia: Secondary | ICD-10-CM

## 2019-09-09 DIAGNOSIS — I10 Essential (primary) hypertension: Secondary | ICD-10-CM

## 2019-09-09 LAB — COMPREHENSIVE METABOLIC PANEL
ALT: 36 U/L (ref 0–44)
AST: 52 U/L — ABNORMAL HIGH (ref 15–41)
Albumin: 2.9 g/dL — ABNORMAL LOW (ref 3.5–5.0)
Alkaline Phosphatase: 51 U/L (ref 38–126)
Anion gap: 16 — ABNORMAL HIGH (ref 5–15)
BUN: 26 mg/dL — ABNORMAL HIGH (ref 8–23)
CO2: 17 mmol/L — ABNORMAL LOW (ref 22–32)
Calcium: 8 mg/dL — ABNORMAL LOW (ref 8.9–10.3)
Chloride: 102 mmol/L (ref 98–111)
Creatinine, Ser: 1.16 mg/dL (ref 0.61–1.24)
GFR calc Af Amer: >60 mL/min (ref >60)
GFR calc non Af Amer: >60 mL/min (ref >60)
Glucose, Bld: 155 mg/dL — ABNORMAL HIGH (ref 70–99)
Potassium: 4.5 mmol/L (ref 3.5–5.1)
Sodium: 135 mmol/L (ref 135–145)
Total Bilirubin: 1.6 mg/dL — ABNORMAL HIGH (ref 0.3–1.2)
Total Protein: 5.9 g/dL — ABNORMAL LOW (ref 6.5–8.1)

## 2019-09-09 LAB — CBC
HCT: 40.2 % (ref 39.0–52.0)
Hemoglobin: 13.2 g/dL (ref 13.0–17.0)
MCH: 30.2 pg (ref 26.0–34.0)
MCHC: 32.8 g/dL (ref 30.0–36.0)
MCV: 92 fL (ref 80.0–100.0)
Platelets: 173 10*3/uL (ref 150–400)
RBC: 4.37 MIL/uL (ref 4.22–5.81)
RDW: 13.4 % (ref 11.5–15.5)
WBC: 6.7 10*3/uL (ref 4.0–10.5)
nRBC: 0 % (ref 0.0–0.2)

## 2019-09-09 LAB — GLUCOSE, CAPILLARY
Glucose-Capillary: 155 mg/dL — ABNORMAL HIGH (ref 70–99)
Glucose-Capillary: 162 mg/dL — ABNORMAL HIGH (ref 70–99)
Glucose-Capillary: 134 mg/dL — ABNORMAL HIGH (ref 70–99)
Glucose-Capillary: 160 mg/dL — ABNORMAL HIGH (ref 70–99)

## 2019-09-09 LAB — BRAIN NATRIURETIC PEPTIDE: B Natriuretic Peptide: 22.5 pg/mL (ref 0.0–100.0)

## 2019-09-09 LAB — MRSA PCR SCREENING: MRSA by PCR: POSITIVE — AB

## 2019-09-09 MED ORDER — INSULIN ASPART 100 UNIT/ML ~~LOC~~ SOLN
0.0000 [IU] | Freq: Every day | SUBCUTANEOUS | Status: DC
  Administered 2019-09-13: 2 [IU] via SUBCUTANEOUS

## 2019-09-09 MED ORDER — GUAIFENESIN-DM 100-10 MG/5ML PO SYRP
5.0000 mL | ORAL_SOLUTION | ORAL | Status: DC | PRN
  Administered 2019-09-09 – 2019-09-15 (×9): 5 mL via ORAL
  Filled 2019-09-09 (×9): qty 5

## 2019-09-09 MED ORDER — MUPIROCIN 2 % EX OINT
1.0000 "application " | TOPICAL_OINTMENT | Freq: Two times a day (BID) | CUTANEOUS | Status: AC
  Administered 2019-09-09 – 2019-09-13 (×10): 1 via NASAL
  Filled 2019-09-09 (×6): qty 22

## 2019-09-09 MED ORDER — INSULIN ASPART 100 UNIT/ML ~~LOC~~ SOLN: 0.0000 [IU] | Freq: Three times a day (TID) | SUBCUTANEOUS | Status: DC

## 2019-09-09 MED ORDER — INSULIN ASPART 100 UNIT/ML ~~LOC~~ SOLN
0.0000 [IU] | Freq: Three times a day (TID) | SUBCUTANEOUS | Status: DC
  Administered 2019-09-09: 3 [IU] via SUBCUTANEOUS
  Administered 2019-09-09: 2 [IU] via SUBCUTANEOUS
  Administered 2019-09-09 – 2019-09-10 (×2): 3 [IU] via SUBCUTANEOUS
  Administered 2019-09-10: 5 [IU] via SUBCUTANEOUS
  Administered 2019-09-10: 8 [IU] via SUBCUTANEOUS
  Administered 2019-09-11 (×3): 3 [IU] via SUBCUTANEOUS
  Administered 2019-09-12: 5 [IU] via SUBCUTANEOUS
  Administered 2019-09-12: 2 [IU] via SUBCUTANEOUS
  Administered 2019-09-12: 3 [IU] via SUBCUTANEOUS
  Administered 2019-09-13 (×2): 5 [IU] via SUBCUTANEOUS
  Administered 2019-09-13 – 2019-09-14 (×2): 3 [IU] via SUBCUTANEOUS
  Administered 2019-09-14: 8 [IU] via SUBCUTANEOUS
  Administered 2019-09-15: 2 [IU] via SUBCUTANEOUS
  Administered 2019-09-15: 5 [IU] via SUBCUTANEOUS
  Administered 2019-09-15 – 2019-09-16 (×2): 2 [IU] via SUBCUTANEOUS
  Administered 2019-09-16: 8 [IU] via SUBCUTANEOUS

## 2019-09-09 MED ORDER — DOXYCYCLINE HYCLATE 100 MG PO TABS
100.0000 mg | ORAL_TABLET | Freq: Two times a day (BID) | ORAL | Status: AC
  Administered 2019-09-09 – 2019-09-11 (×5): 100 mg via ORAL
  Filled 2019-09-09 (×5): qty 1

## 2019-09-09 MED ORDER — CHLORHEXIDINE GLUCONATE CLOTH 2 % EX PADS
6.0000 | MEDICATED_PAD | Freq: Every day | CUTANEOUS | Status: AC
  Administered 2019-09-09 – 2019-09-13 (×4): 6 via TOPICAL

## 2019-09-09 MED ORDER — INSULIN ASPART 100 UNIT/ML ~~LOC~~ SOLN
2.0000 [IU] | Freq: Three times a day (TID) | SUBCUTANEOUS | Status: DC
  Administered 2019-09-09 – 2019-09-16 (×19): 2 [IU] via SUBCUTANEOUS

## 2019-09-09 MED ORDER — INSULIN ASPART 100 UNIT/ML ~~LOC~~ SOLN: 2.0000 [IU] | Freq: Three times a day (TID) | SUBCUTANEOUS | Status: DC

## 2019-09-09 NOTE — Progress Notes (Addendum)
Occupational Therapy Evaluation Patient Details Name: Andrew Cobb MRN: [id number] DOB: 04-10-1951 Today's Date: 09/09/2019    History of Present Illness Pt is a 69 y/o male admitted secondary to AMS. MRI revealed a questionable acute infarct in the left parietal white matter versus persistent restricted diffusion at the site of a chronic infarct is noted. PMH includes CVA, CAD s/p PCI, DM, HTN, OSA on CPAP, and prostate cancer.    Clinical Impression   Patient was agreeable to therapy but seemed foggy during session. He had difficulty verbally responding quickly and processing was slow.  Patient able to stand with supervision and ambulate with min guard and one hand support.  Completed grooming at sink with supervision.  He did display some fine motor coordination deficits evidenced by dropping toothpaste 2 times and having difficulty opening toothbrush package.  Patient displayed good problem solving skills, though, recovering objects and using teeth to help open.  Patient was on 5L O2 at start of session with SpO2 93.  With mobility he did drop to 80, requiring 7L O2 to maintain SpO2 89, though he displayed no symptoms.  Returned to 5L once resting.  Will continue to follow with OT acutely to address the deficits listed below.       Follow Up Recommendations  Home health OT;Supervision - Intermittent    Equipment Recommendations  None recommended by OT    Recommendations for Other Services       Precautions / Restrictions Precautions Precautions: Fall;Other (comment) Precaution Comments: Desat Restrictions Weight Bearing Restrictions: No      Mobility Bed Mobility Overal bed mobility: Needs Assistance Bed Mobility: Supine to Sit     Supine to sit: Min guard;HOB elevated Sit to supine: Min guard;HOB elevated   General bed mobility comments: +rail, increased time. Cues for sequencing  Transfers Overall transfer level: Needs assistance Equipment used: None Transfers:  Sit to/from Stand Sit to Stand: Supervision         General transfer comment: Patient able to use bed rail to help pull himself up    Balance Overall balance assessment: Needs assistance Sitting-balance support: No upper extremity supported;Feet supported Sitting balance-Leahy Scale: Good     Standing balance support: During functional activity;Single extremity supported Standing balance-Leahy Scale: Fair Standing balance comment: reliant on UE support                           ADL either performed or assessed with clinical judgement   ADL Overall ADL's : Needs assistance/impaired Eating/Feeding: Independent;Sitting   Grooming: Oral care;Wash/dry hands;Supervision/safety;Standing   Upper Body Bathing: Supervision/ safety;Standing   Lower Body Bathing: Min guard;Sit to/from stand   Upper Body Dressing : Set up;Sitting   Lower Body Dressing: Min guard;Sit to/from stand   Toilet Transfer: Min guard;Ambulation   Toileting- Clothing Manipulation and Hygiene: Supervision/safety;Sit to/from stand       Functional mobility during ADLs: Min guard       Vision Patient Visual Report: No change from baseline       Perception     Praxis      Pertinent Vitals/Pain Pain Assessment: No/denies pain     Hand Dominance Left   Extremity/Trunk Assessment Upper Extremity Assessment Upper Extremity Assessment: Generalized weakness;RUE deficits/detail;LUE deficits/detail RUE Coordination: decreased fine motor LUE Coordination: decreased fine motor       Cervical / Trunk Assessment Cervical / Trunk Assessment: Normal   Communication Communication Communication: Expressive difficulties;HOH   Cognition Arousal/Alertness:  Awake/alert Behavior During Therapy: Flat affect Overall Cognitive Status: History of cognitive impairments - at baseline                                 General Comments: Patient moving slowly and requiring increased time to  respond.   General Comments      Exercises     Shoulder Instructions      Home Living Family/patient expects to be discharged to:: Private residence Living Arrangements: Spouse/significant other Available Help at Discharge: Family;Available 24 hours/day Type of Home: House Home Access: Stairs to enter [employer] of Steps: 1 Entrance Stairs-Rails: None Home Layout: One level     Bathroom Shower/Tub: Occupational psychologist: Standard     Home Equipment: Environmental consultant - 2 wheels;Shower seat;Walker - 4 wheels;Cane - single point;Bedside commode      Lives With: Spouse    Prior Functioning/Environment Level of Independence: Independent                 OT Problem List: Decreased strength;Decreased activity tolerance;Impaired balance (sitting and/or standing);Decreased safety awareness;Cardiopulmonary status limiting activity      OT Treatment/Interventions: Self-care/ADL training;Therapeutic exercise;Energy conservation;Therapeutic activities;DME and/or AE instruction;Cognitive remediation/compensation;Patient/family education;Balance training    OT Goals(Current goals can be found in the care plan section) Acute Rehab OT Goals Patient Stated Goal: to get some sleep OT Goal Formulation: With patient Time For Goal Achievement: 09/23/19 Potential to Achieve Goals: Good  OT Frequency: Min 3X/week   Barriers to D/C:            Co-evaluation              AM-PAC OT "6 Clicks" Daily Activity     Outcome Measure Help from another person eating meals?: None Help from another person taking care of personal grooming?: A Little Help from another person toileting, which includes using toliet, bedpan, or urinal?: A Little Help from another person bathing (including washing, rinsing, drying)?: A Little Help from another person to put on and taking off regular upper body clothing?: A Little Help from another person to put on and taking off regular lower  body clothing?: A Little 6 Click Score: 19   End of Session Equipment Utilized During Treatment: Gait belt;Oxygen Nurse Communication: Mobility status  Activity Tolerance: Patient tolerated treatment well Patient left: in bed;with call bell/phone within reach(EOB with SPT)  OT Visit Diagnosis: Unsteadiness on feet (R26.81);Muscle weakness (generalized) (M62.81);Other symptoms and signs involving cognitive function                Time: 1037-1101 OT Time Calculation (min): 24 min Charges:  OT General Charges $OT Visit: 1 Visit OT Evaluation $OT Eval Moderate Complexity: 1 Mod OT Treatments $Self Care/Home Management : 8-22 mins   August Luz, OTR/L   Phylliss Bob 09/09/2019, 11:57 AM

## 2019-09-09 NOTE — Progress Notes (Signed)
Physical Therapy Treatment Patient Details Name: Andrew Cobb MRN: [id number] DOB: 1951/01/07 Today's Date: 09/09/2019    History of Present Illness Pt is a 69 y/o male admitted secondary to AMS. MRI revealed a questionable acute infarct in the left parietal white matter versus persistent restricted diffusion at the site of a chronic infarct is noted. PMH includes CVA, CAD s/p PCI, DM, HTN, OSA on CPAP, and prostate cancer.     PT Comments    Pt very lethargic. Reports not getting much sleep last night. He required min guard assist bed mobility, min guard assist transfers and min guard assist ambulation 25' with RW. Pt on 3L O2 with desat to 87% during gait. SpO2 89% at rest. RN increased to 5L at end of session. Pt in bed asleep at end of session.    Follow Up Recommendations  Home health PT;Supervision/Assistance - 24 hour     Equipment Recommendations  None recommended by PT    Recommendations for Other Services       Precautions / Restrictions Precautions Precautions: Fall Restrictions Weight Bearing Restrictions: No    Mobility  Bed Mobility Overal bed mobility: Needs Assistance Bed Mobility: Supine to Sit;Sit to Supine     Supine to sit: Min guard;HOB elevated Sit to supine: Min guard;HOB elevated   General bed mobility comments: +rail, increased time. Cues for sequencing  Transfers Overall transfer level: Needs assistance Equipment used: Ambulation equipment used Transfers: Sit to/from Stand Sit to Stand: Min guard         General transfer comment: increased time to stabilize balance. Pt bracing BLE against bed.  Ambulation/Gait Ambulation/Gait assistance: Min guard Gait Distance (Feet): 25 Feet Assistive device: Rolling walker (2 wheeled) Gait Pattern/deviations: Step-through pattern;Decreased stride length Gait velocity: Decreased   General Gait Details: min guard for safety and IV/line management. Ambulated on 3L O2 with desat to 87%. SpO2 89% at  rest.   Stairs             Wheelchair Mobility    Modified [name] (Stroke Patients Only)       Balance Overall balance assessment: Needs assistance Sitting-balance support: No upper extremity supported;Feet supported Sitting balance-Leahy Scale: Good     Standing balance support: During functional activity;Bilateral upper extremity supported;Single extremity supported Standing balance-Leahy Scale: Poor Standing balance comment: reliant on UE support                            Cognition Arousal/Alertness: Lethargic Behavior During Therapy: Flat affect Overall Cognitive Status: No family/caregiver present to determine baseline cognitive functioning                                 General Comments: Pt with expressive difficulties and very slow to respond to questions. Disoriented to time and situation. Very lethargic. States he did not get to sleep until 2am.      Exercises      General Comments General comments (skin integrity, edema, etc.): Pt on 3L O2 with SpO2 89% at rest. HR 71. Desat to 87% during ambulation with HR 74. RN notified. O2 increased to 5L at end of session.      Pertinent Vitals/Pain Pain Assessment: No/denies pain    Home Living                      Prior Function  PT Goals (current goals can now be found in the care plan section) Acute Rehab PT Goals Patient Stated Goal: to get some sleep Progress towards PT goals: Progressing toward goals    Frequency    Min 4X/week      PT Plan Discharge plan needs to be updated    Co-evaluation              AM-PAC PT "6 Clicks" Mobility   Outcome Measure  Help needed turning from your back to your side while in a flat bed without using bedrails?: A Little Help needed moving from lying on your back to sitting on the side of a flat bed without using bedrails?: A Little Help needed moving to and from a bed to a chair (including a  wheelchair)?: A Little Help needed standing up from a chair using your arms (e.g., wheelchair or bedside chair)?: A Little Help needed to walk in hospital room?: A Little Help needed climbing 3-5 steps with a railing? : A Lot 6 Click Score: 17    End of Session Equipment Utilized During Treatment: Gait belt;Oxygen Activity Tolerance: Patient limited by lethargy Patient left: in bed;with call bell/phone within reach;with bed alarm set Nurse Communication: Mobility status PT Visit Diagnosis: Unsteadiness on feet (R26.81);Muscle weakness (generalized) (M62.81)     Time: [id number] PT Time Calculation (min) (ACUTE ONLY): 16 min  Charges:  $Gait Training: 8-22 mins                     Lorrin Goodell, PT  Office # [phone] Pager [phone]    Lorriane Shire 09/09/2019, 9:58 AM

## 2019-09-09 NOTE — Progress Notes (Signed)
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  MRI showed acute CVA along with chronic changes, neurology team was consulted.  There was also concerns of seizure therefore started on Keppra   Assessment & Plan:   Principal Problem:   Altered mental status Active Problems:   OSA (obstructive sleep apnea)   Hypertension associated with diabetes ([name])   Uncontrolled type 2 diabetes mellitus with hyperglycemia, without long-term current use of insulin ([place])   Coronary artery disease   Hyperlipidemia associated with type 2 diabetes mellitus ([place])   History of CVA (cerebrovascular accident)   Acute encephalopathy   Bilateral pneumonia   Acute encephalopathy Acute seizures, Acute CVA 66mm left parietal white matter.  History of recurrent embolic infarcts/CVA with hemorrhagic conversion in July 2020 -Status post loading dose of Keppra, Keppra 500 mg twice daily -EEG-negative for seizures but shows encephalopathy.  Continue seizure precaution -Neurology team following -Aspirin 81 mg, atorvastatin 40 mg daily -CT head-negative -UA-shows glucosuria but no evidence of infection  Bilateral multifocal infiltrate -Chest x-ray shows bilateral patchy pneumonia -COVID-19-negative  -Procalcitonin-negative, BNP Normal. Will order 5 days PO Doxycycline  -Respiratory panel-negative -Echocardiogram March 2020-EF 60 to 65%, mild LVH  Acute kidney injury, prerenal -Appears to have resolved.  Creatinine at baseline around 1.1  Coronary artery disease status post PCI -Currently chest pain-free.  Continue aspirin and statin  Essential hypertension -Benzapril on hold due to AKI  D M2, poorly controlled due to  hyperglycemia -Home p.o. meds on hold, A1c 9.2 -Not on sliding scale, I will add sliding scale and premeal insulin  Hyperlipidemia -Statin  OSA -CPAP  PT-Home health.   DVT prophylaxis: Lovenox Code Status: DNR Family Communication:  Called [name], left voicemail.  Disposition Plan:   Patient From= Home  Patient Anticipated D/C place= Home with home health  Barriers= Slightly somewhat confused, but improved. Once back to baseline will d/c. Hopefully by tomorrow.   Subjective: Laying in his bed, no complaints. Somewhat confused about place and today's date.  But overall able to tell me basic commands.  Lactulose.  Review of Systems Otherwise negative except as per HPI, including: General: Denies fever, chills, night sweats or unintended weight loss. Resp: Denies cough, wheezing, shortness of breath. Cardiac: Denies chest pain, palpitations, orthopnea, paroxysmal nocturnal dyspnea. GI: Denies abdominal pain, nausea, vomiting, diarrhea or constipation GU: Denies dysuria, frequency, hesitancy or incontinence MS: Denies muscle aches, joint pain or swelling Neuro: Denies headache, neurologic deficits (focal weakness, numbness, tingling), abnormal gait Psych: Denies anxiety, depression, SI/HI/AVH Skin: Denies new rashes or lesions ID: Denies sick contacts, exotic exposures, travel  Examination:  General exam: Appears calm and comfortable  Respiratory system: Clear to auscultation. Respiratory effort normal. Cardiovascular system: S1 & S2 heard, RRR. No JVD, murmurs, rubs, gallops or clicks. No pedal edema. Gastrointestinal system: Abdomen is nondistended, soft and nontender. No organomegaly or masses felt. Normal bowel sounds heard. Central nervous system: Alert and oriented. No focal neurological deficits. Extremities: Symmetric 5 x 5 power. Skin: No rashes, lesions or ulcers Psychiatry: Poor judgment and insight.  Alert awake oriented x 2 (baseline  AAOx4)    Objective: Vitals:   09/08/19 2145 09/08/19 2147 09/08/19 2254 09/09/19 0000  BP: [name] Kitchen)  144/80  136/79   Pulse: 80  80 83  Resp: (!) 22  20   Temp:  97.6 F (36.4 C)    TempSrc:  Oral    SpO2: 93%  91% 93%  Weight:      Height:       No intake or output data in the 24 hours ending 09/09/19 0808 Filed Weights   09/08/19 0816  Weight: 90.7 kg     Data Reviewed:   CBC: Recent Labs  Lab 09/07/19 1121 09/07/19 1145 09/09/19 0205  WBC 8.2  --  6.7  NEUTROABS 7.0  --   --   HGB 13.8 13.3 13.2  HCT 44.1 39.0 40.2  MCV 97.8  --  92.0  PLT 142*  --  [id number]   Basic Metabolic Panel: Recent Labs  Lab 09/07/19 1121 09/07/19 1145 09/08/19 1528 09/09/19 0205  NA 134* 132* 136 135  K 5.0 5.1 4.7 4.5  CL 101 102 102 102  CO2 18*  --  22 17*  GLUCOSE 161* 160* 127* 155*  BUN 25* 29* 25* 26*  CREATININE 1.29* 1.20 1.18 1.16  CALCIUM 8.0*  --  7.9* 8.0*   GFR: Estimated Creatinine Clearance: 69.1 mL/min (by C-G formula based on SCr of 1.16 mg/dL). Liver Function Tests: Recent Labs  Lab 09/07/19 1121 09/08/19 1528 09/09/19 0205  AST 52* 50* 52*  ALT 36 36 36  ALKPHOS 52 51 51  BILITOT 1.5* 1.7* 1.6*  PROT 6.1* 5.7* 5.9*  ALBUMIN 3.2* 2.8* 2.9*   No results for input(s): LIPASE, AMYLASE in the last 168 hours. No results for input(s): AMMONIA in the last 168 hours. Coagulation Profile: Recent Labs  Lab 09/07/19 1121  INR 1.0   Cardiac Enzymes: No results for input(s): CKTOTAL, CKMB, CKMBINDEX, TROPONINI in the last 168 hours. BNP (last 3 results) No results for input(s): PROBNP in the last 8760 hours. HbA1C: Recent Labs    09/08/19 0350  HGBA1C 9.2*   CBG: Recent Labs  Lab 09/07/19 1014  GLUCAP 185*   Lipid Profile: Recent Labs    09/08/19 0350  CHOL 90  HDL 35*  LDLCALC 40  TRIG 73  CHOLHDL 2.6   Thyroid Function Tests: No results for input(s): TSH, T4TOTAL, FREET4, T3FREE, THYROIDAB in the last 72 hours. Anemia Panel: Recent  Labs    09/08/19 1528  FERRITIN 892*   Sepsis Labs: Recent Labs  Lab 09/08/19 1528  PROCALCITON <0.10    Recent Results (from the past 240 hour(s))  Respiratory Panel by PCR     Status: None   Collection Time: 09/08/19  2:57 PM   Specimen: Nasopharyngeal Swab; Respiratory  Result Value Ref Range Status   Adenovirus NOT DETECTED NOT DETECTED Final   Coronavirus 229E NOT DETECTED NOT DETECTED Final    Comment: (NOTE) The Coronavirus on the Respiratory Panel, DOES NOT test for the novel  Coronavirus (2019 nCoV)    Coronavirus HKU1 NOT DETECTED NOT DETECTED Final   Coronavirus NL63 NOT DETECTED NOT DETECTED Final   Coronavirus OC43 NOT DETECTED NOT DETECTED Final   Metapneumovirus NOT DETECTED NOT DETECTED Final   Rhinovirus / Enterovirus NOT DETECTED NOT DETECTED Final   Influenza A NOT DETECTED NOT DETECTED Final   Influenza B NOT DETECTED NOT DETECTED Final   Parainfluenza Virus 1 NOT DETECTED NOT DETECTED Final   Parainfluenza Virus 2 NOT DETECTED NOT DETECTED Final   Parainfluenza Virus 3 NOT DETECTED NOT DETECTED Final   Parainfluenza Virus 4 NOT  DETECTED NOT DETECTED Final   Respiratory Syncytial Virus NOT DETECTED NOT DETECTED Final   Bordetella pertussis NOT DETECTED NOT DETECTED Final   Chlamydophila pneumoniae NOT DETECTED NOT DETECTED Final   Mycoplasma pneumoniae NOT DETECTED NOT DETECTED Final    Comment: Performed at [facility], [place] [address] [place], [place] [address]  MRSA PCR Screening     Status: Abnormal   Collection Time: 09/08/19 11:24 PM   Specimen: Nasopharyngeal  Result Value Ref Range Status   MRSA by PCR POSITIVE (A) NEGATIVE Final    Comment:        The GeneXpert MRSA Assay (FDA approved for NASAL specimens only), is one component of a comprehensive MRSA colonization surveillance program. It is not intended to diagnose MRSA infection nor to guide or monitor treatment for MRSA infections. RESULT CALLED TO, READ BACK BY AND  VERIFIED WITH: A ROSS RN 09/09/19 0059 JDW Performed at [facility], [place] [address] [place], [place] [address]          Radiology Studies: DG Chest 2 View  Result Date: 09/08/2019 CLINICAL DATA:  Respiratory failure with hypoxia EXAM: CHEST - 2 VIEW COMPARISON:  10/09/2018 FINDINGS: Low lung volumes. Patchy bilateral airspace opacities have worsened since prior study. No effusions. Heart is upper limits normal in size. No acute bony abnormality. IMPRESSION: Patchy bilateral airspace disease concerning for pneumonia. Electronically Signed   By: Rolm Baptise M.D.   On: 09/08/2019 08:48   CT HEAD WO CONTRAST  Result Date: 09/07/2019 CLINICAL DATA:  Ataxia today.  Possible stroke. EXAM: CT HEAD WITHOUT CONTRAST TECHNIQUE: Contiguous axial images were obtained from the base of the skull through the vertex without intravenous contrast. COMPARISON:  Brain MRI 02/09/2019. FINDINGS: Brain: No evidence of acute infarction, hemorrhage, hydrocephalus, extra-axial collection or mass lesion/mass effect. Atrophy, extensive chronic microvascular ischemic change and remote infarcts in the anterior and posterior watershed territories on the left again seen. Vascular: No hyperdense vessel or unexpected calcification. Skull: Intact.  No focal lesion. Sinuses/Orbits: Status post cataract surgery.  Otherwise negative. Other: None. IMPRESSION: No acute intracranial normality. Extensive chronic microvascular ischemic change remote infarcts on the left as above. Electronically Signed   By: Inge Rise M.D.   On: 09/07/2019 12:06   MR Brain Wo Contrast (neuro protocol)  Addendum Date: 09/07/2019   ADDENDUM REPORT: 09/07/2019 17:31 ADDENDUM: Please note diffusion weighted signal abnormality was present at the site of the 8 mm focus of restricted diffusion demonstrated on the current MRI. On the current exam, findings may reflect persistent restricted diffusion at site of a chronic infarct. However, a small focus  of acute ischemia and acute infarction cannot be excluded. Addended findings discussed with Dr. Rory Percy by telephone at the 5:25 p.m. on 09/07/2019 at 5:29 pm. Electronically Signed   By: Kellie Simmering DO   On: 09/07/2019 17:31   Result Date: 09/07/2019 CLINICAL DATA:  Ataxia, stroke suspected. Additional history provided: Recent speech difficulty, history of stroke in August of last year. EXAM: MRI HEAD WITHOUT CONTRAST TECHNIQUE: Multiplanar, multiecho pulse sequences of the brain and surrounding structures were obtained without intravenous contrast. COMPARISON:  Head CT 09/07/2019, brain MRI 02/09/2019 FINDINGS: Brain: Multiple sequences are significantly motion degraded, limiting evaluation. Most notably, there is moderate/severe motion degradation of the sagittal T1 weighted sequence, moderate/severe motion degradation of the coronal T2 weighted sequence and moderate motion degradation of the axial T1 weighted sequence. There is also mild-to-moderate d motion degradation of the axial diffusion-weighted  sequence. There is an 8 mm focus of restricted diffusion consistent with acute infarction within the left parietal white matter (series 2, image 32). Redemonstrated chronic cortically based infarcts within the left frontoparietal operculum and left temporoparietal junction as well as portions of the left occipital lobe. Additional small chronic cortically based infarct within the anterolateral left frontal lobe. As before, there is chronic hemosiderin deposition associated with infarcts in the anterior left frontal lobe and left temporoparietal junction. Redemonstrated small chronic lacunar infarct within the right lentiform nucleus. A small chronic lacunar infarct within the left lentiform nucleus was not present on prior MRI. Background advanced chronic small vessel ischemic disease is unchanged. Stable, mild generalized parenchymal atrophy. Vascular: Flow voids maintained within the proximal large arterial  vessels. Skull and upper cervical spine: No focal marrow lesion is identified within the limitations of motion degradation. Sinuses/Orbits: Visualized orbits demonstrate no acute abnormality. Mild ethmoid sinus mucosal thickening. No significant mastoid effusion. IMPRESSION: 1. Significantly motion degraded examination as described. 2. 8 mm acute infarct within the left parietal white matter. 3. Redemonstration of multiple chronic cortically based infarcts within the left cerebral hemisphere. 4. A chronic lacunar infarct within the left lentiform nucleus is new from MRI 02/09/2019. Chronic right lentiform nucleus lacunar infarct and background advanced chronic small vessel ischemic disease, unchanged. 5. Stable, mild generalized parenchymal atrophy. Electronically Signed: By: Kellie Simmering DO On: 09/07/2019 16:16   EEG adult  Result Date: 09/08/2019 Lora Havens, MD     09/08/2019 10:26 AM Patient Name: GALE RANDO MRN: [id number] Epilepsy Attending: Lora Havens Referring Physician/Provider: Dr Amie [place] Date: 09/07/2019 Duration: 22.45 mins Patient history: 69 year old man with 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 EEG to evaluate for seizure. Level of alertness: awake AEDs during EEG study: LEV Technical aspects: This EEG study was done with scalp electrodes positioned according to the 10-20 International system of electrode placement. Electrical activity was acquired at a sampling rate of 500Hz  and reviewed with a high frequency filter of 70Hz  and a low frequency filter of 1Hz . EEG data were recorded continuously and digitally stored. DESCRIPTION: The posterior dominant rhythm consists of 8.5Hz  activity of moderate voltage (25-35 uV) seen predominantly in posterior head regions, symmetric and reactive to eye opening and eye closing. Intermittent generalized,  maximal left posterior temporal region3-5hz  theta-delta slowing was seen.  Hyperventilation and photic stimulation were not performed. ABNORMALITY - Intermittent slow, generalized left posterior temporal region IMPRESSION: This study showed evidence of cortical dysfunction in left posterior temporal region likely secondary to underlying stroke as well as mild diffuse encephalopathy. No seizures or definite epileptiform discharges were seen throughout the recording. Priyanka Barbra Sarks        Scheduled Meds: .  stroke: mapping our early stages of recovery book   Does not apply Once  . aspirin EC  81 mg Oral Daily  . atorvastatin  40 mg Oral QHS  . Chlorhexidine Gluconate Cloth  6 each Topical Q0600  . enoxaparin (LOVENOX) injection  40 mg Subcutaneous Q24H  . levETIRAcetam  500 mg Oral BID  . mouth rinse  15 mL Mouth Rinse BID  . mupirocin ointment  1 application Nasal BID   Continuous Infusions: . piperacillin-tazobactam 3.375 g (09/08/19 2314)     LOS: 1 day   Time spent= 25 mins    Estiven Kohan Arsenio Loader, MD [facility]  If 7PM-7AM, please contact night-coverage  09/09/2019, 8:08 AM

## 2019-09-09 NOTE — Progress Notes (Signed)
CRITICAL VALUE STICKER  CRITICAL VALUE: MRSA +  RECEIVER (on-site recipient of call): Paulino Door RN   DATE & TIME NOTIFIED: 09/09/19 0100

## 2019-09-09 NOTE — Evaluation (Addendum)
Speech Language Pathology Evaluation Patient Details Name: Andrew Cobb MRN: [id number] DOB: 1951/05/11 Today's Date: 09/09/2019 Time: [id number] SLP Time Calculation (min) (ACUTE ONLY): 45 min  Problem List:  Patient Active Problem List   Diagnosis Date Noted  . Acute encephalopathy 09/08/2019  . Bilateral pneumonia 09/08/2019  . Altered mental status 09/07/2019  . History of CVA (cerebrovascular accident) 09/07/2019  . Morbid obesity ([place]) 06/19/2019  . Hyperlipidemia associated with type 2 diabetes mellitus ([place]) 06/19/2019  . Expressive aphasia 06/19/2019  . Vitamin D deficiency 06/19/2019  . Bradycardia with 31-40 beats per minute 02/08/2019  . Chronic venous insufficiency   . Asthma   . Arthritis   . GERD (gastroesophageal reflux disease)   . Hypogonadism in male   . Cryptogenic stroke ([place])   . Thrombocythemia ([place])   . 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 H/O right coronary artery stent placement   . Allergic rhinitis 08/19/2018  . Cervical radiculopathy 08/17/2017  . Advanced care planning/counseling discussion 07/21/2017  . History of prostate cancer 12/08/2016  . H/O bariatric surgery 07/17/2016  . Hypertension associated with diabetes ([place]) 04/24/2015  . Uncontrolled type 2 diabetes mellitus with hyperglycemia, without long-term current use of insulin ([place]) 04/24/2015  . Chronic sinusitis 08/13/2009  . Coronary atherosclerosis 04/02/2009  . 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 OSA (obstructive sleep apnea) 04/02/2009  . Coronary artery disease 1996   Past Medical History:  Past Medical History:  Diagnosis Date  . Arthritis   . Asthma   . Cancer [name])    prostate  . Chronic venous insufficiency    varicose vein lower extremity with inflammation  . Coronary artery disease 1996   two stents placed   . Diabetes mellitus without complication  ([place])    type 2 on metformin  . GERD (gastroesophageal reflux disease)    no issues since gastric bypass surgery as stated per pt  . Hyperlipidemia   . Hypogonadism in male   . MRSA (methicillin resistant Staphylococcus aureus) infection    07/30/2008 thru 08/07/2008  . Sleep apnea    on BIPAP  . Stented coronary artery   . Stroke ([place])   . Thrombocythemia [facility])    Past Surgical History:  Past Surgical History:  Procedure Laterality Date  . ANGIOPLASTY    . ANGIOPLASTY     with stent 04/07/1995  . ANTERIOR CERVICAL DECOMPRESSION/DISCECTOMY FUSION 4 LEVELS N/A 08/17/2017   Procedure: Anterior discectomy with fusion and plate fixation Cervical Three-Four, Four-Five, Five-Six, and Six-Seven Fusion;  Surgeon: Ditty, Kevan Ny, MD;  Location: [place];  Service: Neurosurgery;  Laterality: N/A;  Anterior discectomy with fusion and plate fixation Cervical Three-Four, Four-Five, Five-Six, and Six-Seven Fusion   . APPENDECTOMY     1966  . BUBBLE STUDY  09/20/2018   Procedure: BUBBLE STUDY;  Surgeon: Josue Hector, MD;  Location: [place];  Service: Cardiovascular;;  . [place]  . CARDIOVASCULAR STRESS TEST     07/31/2011  . CARPAL TUNNEL RELEASE Left   . CHOLECYSTECTOMY     2006  . colonscopy      08/25/2012  . EYE SURGERY Bilateral    cataract  . FUNCTIONAL ENDOSCOPIC SINUS SURGERY     11/10/2013  . GASTRIC BYPASS     10/05/2012  . HERNIA REPAIR     left inguinal 1981  . INCISION AND DRAINAGE ABSCESS Right 02/26/2017  Procedure: INCISION AND DRAINAGE ABSCESS;  Surgeon: Nickie Retort, MD;  Location: [facility];  Service: Urology;  Laterality: Right;  . JOINT REPLACEMENT     bilateral  . left ankle surgery      05/03/2003   . left carpel tunnel      09/18/1993  . left knee meniscal tear      01/25/2010  . left knee meniscal tear repair      05/04/1996  . left rotator cuff repair      05/03/2003   . LOOP RECORDER INSERTION N/A 09/20/2018   Procedure:  LOOP RECORDER INSERTION;  Surgeon: Evans Lance, MD;  Location: [place] [facility];  Service: Cardiovascular;  Laterality: N/A;  . REPLACEMENT TOTAL KNEE BILATERAL  07/13/2015  . right ankle surgery      fracture has 2 screws 07/07/1997  . right carpel tunnel      05/16/1992  . right shoulder replacement      01/27/2006  . SCROTAL EXPLORATION Right 02/26/2017   Procedure: SCROTUM EXPLORATION;  Surgeon: Nickie Retort, MD;  Location: [facility];  Service: Urology;  Laterality: Right;  . TEE WITHOUT CARDIOVERSION N/A 09/20/2018   Procedure: TRANSESOPHAGEAL ECHOCARDIOGRAM (TEE);  Surgeon: Josue Hector, MD;  Location[facility];  Service: Cardiovascular;  Laterality: N/A;  . TOTAL KNEE ARTHROPLASTY Left 07/13/2015   Procedure: LEFT TOTAL KNEE ARTHROPLASTY;  Surgeon: Gaynelle Arabian, MD;  Location: [facility];  Service: Orthopedics;  Laterality: Left;   HPI:  69yo male admitted 09/07/19 with AMS. PMH: CVA with residual speech deficit, hemorrhagic conversion of ischemic stroke, CAD s/p PCI, DM2, GERD HTN, HLD, OSA on CPAP. MRI = 60mm acute infarct left parietal white matter; multiple chronic cortically based infarcts left cerebral hemisphere, chronic lacunar infarct in left lentiform nulceus. Mild generalized parenchymal atrophy. CXR = patchy bilateral airspace disease concerning for PNA   Assessment / Plan / Recommendation Clinical Impression  Pt is known to this SLP from [facility] outpatient clinic. Pt was seen by this SLP x1 in October 2020 for aphasia treatment. Today, pt demonstrates higher level receptive and expressive language deficits, characterized by functional spontaneous speech. Simple yes/no responses are accurate, however, accuracy decreases with increasing complexity/abstract questions. Pt is able to follow 1 and 2-step commands, but was unable to follow 3-step commands. Word and phrase repetition are intact, however, pt was unable to repeat sentence length material. Pt was perseverative on  automatic sequences, requiring mod verbal cues for accuracy. Pt named items around his room, however, responsive naming and category member naming were challenging. Unable to fully assess cognitive status given higher level language deficits. Pt exhibited difficulty with clock drawing task, raising concern for executive function deficits. 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 Based on presentation today, driving is NOT recommended. Recommend assistance with self medication as well, given cognitive linguistic issues. MD was notified of test results. Recommend home health ST at [place], as well as 24 hour supervision.    SLP Assessment  SLP Recommendation/Assessment: All further Speech Language Pathology needs can be addressed in the next venue of care   SLP Visit Diagnosis: Cognitive communication deficit (R41.841)    Follow Up Recommendations  24 hour supervision/assistance  Home health ST     SLP Evaluation Cognition  Overall Cognitive Status: History of cognitive/linguistic impairments - at baseline Arousal/Alertness: Awake/alert Orientation Level: Oriented to person;Oriented to place;Disoriented to time;Disoriented to situation Comments: Difficulty with  clock drawing task       Comprehension  Auditory Comprehension Overall Auditory Comprehension: Impaired at baseline Yes/No Questions: Impaired Basic Biographical Questions: 76-100% accurate Basic Immediate Environment Questions: 75-100% accurate Complex Questions: 50-74% accurate Commands: Impaired One Step Basic Commands: 75-100% accurate Two Step Basic Commands: 75-100% accurate Multistep Basic Commands: 50-74% accurate Conversation: Simple   Other Conversation Comments: Outpatient SLP eval @ [facility] 03/29/2019 - [place] Aphasia Battery- Revised                  Language Quotient               86.7/100    Expression Expression Primary Mode of Expression:  Verbal Verbal Expression Overall Verbal Expression: Impaired Initiation: No impairment Automatic Speech: Name;Social Response;Counting;Day of week;Month of year Level of Generative/Spontaneous Verbalization: Sentence Repetition: Impaired Level of Impairment: Sentence level Naming: Impairment Responsive: 76-100% accurate Confrontation: Impaired Convergent: 75-100% accurate Divergent: 50-74% accurate Verbal Errors: Perseveration Written Expression Dominant Hand: Left   Oral / Motor  Oral Motor/Sensory Function Overall Oral Motor/Sensory Function: Within functional limits Motor Speech Overall Motor Speech: Appears within functional limits for tasks assessed   GO                   Emily Massar B. Quentin Ore, [facility], [place] Speech Language Pathologist Office: [phone] Pager: [phone]  Shonna Chock 09/09/2019, 11:53 AM

## 2019-09-10 ENCOUNTER — Inpatient Hospital Stay (HOSPITAL_COMMUNITY): Payer: PPO

## 2019-09-10 LAB — CBC
HCT: 37.7 % — ABNORMAL LOW (ref 39.0–52.0)
Hemoglobin: 12.4 g/dL — ABNORMAL LOW (ref 13.0–17.0)
MCH: 30.4 pg (ref 26.0–34.0)
MCHC: 32.9 g/dL (ref 30.0–36.0)
MCV: 92.4 fL (ref 80.0–100.0)
Platelets: 191 10*3/uL (ref 150–400)
RBC: 4.08 MIL/uL — ABNORMAL LOW (ref 4.22–5.81)
RDW: 13.3 % (ref 11.5–15.5)
WBC: 5.6 10*3/uL (ref 4.0–10.5)
nRBC: 0 % (ref 0.0–0.2)

## 2019-09-10 LAB — COMPREHENSIVE METABOLIC PANEL
ALT: 38 U/L (ref 0–44)
AST: 54 U/L — ABNORMAL HIGH (ref 15–41)
Albumin: 2.6 g/dL — ABNORMAL LOW (ref 3.5–5.0)
Alkaline Phosphatase: 51 U/L (ref 38–126)
Anion gap: 12 (ref 5–15)
BUN: 26 mg/dL — ABNORMAL HIGH (ref 8–23)
CO2: 20 mmol/L — ABNORMAL LOW (ref 22–32)
Calcium: 8 mg/dL — ABNORMAL LOW (ref 8.9–10.3)
Chloride: 102 mmol/L (ref 98–111)
Creatinine, Ser: 1.22 mg/dL (ref 0.61–1.24)
GFR calc Af Amer: >60 mL/min (ref >60)
GFR calc non Af Amer: >60 mL/min (ref >60)
Glucose, Bld: 150 mg/dL — ABNORMAL HIGH (ref 70–99)
Potassium: 4.1 mmol/L (ref 3.5–5.1)
Sodium: 134 mmol/L — ABNORMAL LOW (ref 135–145)
Total Bilirubin: 2.1 mg/dL — ABNORMAL HIGH (ref 0.3–1.2)
Total Protein: 5.5 g/dL — ABNORMAL LOW (ref 6.5–8.1)

## 2019-09-10 LAB — GLUCOSE, CAPILLARY
Glucose-Capillary: 154 mg/dL — ABNORMAL HIGH (ref 70–99)
Glucose-Capillary: 255 mg/dL — ABNORMAL HIGH (ref 70–99)
Glucose-Capillary: 217 mg/dL — ABNORMAL HIGH (ref 70–99)
Glucose-Capillary: 138 mg/dL — ABNORMAL HIGH (ref 70–99)

## 2019-09-10 LAB — MAGNESIUM: Magnesium: 2.2 mg/dL (ref 1.7–2.4)

## 2019-09-10 MED ORDER — IPRATROPIUM-ALBUTEROL 0.5-2.5 (3) MG/3ML IN SOLN
3.0000 mL | Freq: Three times a day (TID) | RESPIRATORY_TRACT | Status: DC
  Administered 2019-09-10 – 2019-09-14 (×11): 3 mL via RESPIRATORY_TRACT
  Filled 2019-09-10 (×10): qty 3

## 2019-09-10 MED ORDER — BUDESONIDE 0.5 MG/2ML IN SUSP
0.5000 mg | Freq: Two times a day (BID) | RESPIRATORY_TRACT | Status: DC
  Administered 2019-09-10 – 2019-09-16 (×13): 0.5 mg via RESPIRATORY_TRACT
  Filled 2019-09-10 (×13): qty 2

## 2019-09-10 MED ORDER — IPRATROPIUM-ALBUTEROL 0.5-2.5 (3) MG/3ML IN SOLN: 3.0000 mL | RESPIRATORY_TRACT | Status: DC | PRN

## 2019-09-10 MED ORDER — IOHEXOL 350 MG/ML SOLN: 75.0000 mL | Freq: Once | INTRAVENOUS | Status: DC | PRN

## 2019-09-10 MED ORDER — IPRATROPIUM-ALBUTEROL 0.5-2.5 (3) MG/3ML IN SOLN
3.0000 mL | Freq: Four times a day (QID) | RESPIRATORY_TRACT | Status: DC
  Administered 2019-09-10 (×2): 3 mL via RESPIRATORY_TRACT
  Filled 2019-09-10 (×2): qty 3

## 2019-09-10 NOTE — Plan of Care (Signed)
Plan of care reviewed with pt and wife at bedside. Pt weaned to 3Lnc with oxygen at 90%, MD is okay with that saturation. Pt educated on cough and deep breathing. Pt continues to cough. Tele in place. Urinal within reach. Bed in low locked position, alarms activated, call bell in reach. Pt stable at this time, will continue to monitor.  Problem: Education: Goal: Knowledge of General Education information will improve Description: Including pain rating scale, medication(s)/side effects and non-pharmacologic comfort measures Outcome: Progressing   Problem: Health Behavior/Discharge Planning: Goal: Ability to manage health-related needs will improve Outcome: Progressing   Problem: Clinical Measurements: Goal: Will remain free from infection Outcome: Progressing Goal: Diagnostic test results will improve Outcome: Progressing Goal: Respiratory complications will improve Outcome: Progressing   Problem: Activity: Goal: Risk for activity intolerance will decrease Outcome: Progressing   Problem: Nutrition: Goal: Adequate nutrition will be maintained Outcome: Progressing   Problem: Elimination: Goal: Will not experience complications related to bowel motility Outcome: Progressing Goal: Will not experience complications related to urinary retention Outcome: Progressing   Problem: Safety: Goal: Ability to remain free from injury will improve Outcome: Progressing   Problem: Skin Integrity: Goal: Risk for impaired skin integrity will decrease Outcome: Progressing   Problem: Education: Goal: Knowledge of secondary prevention will improve Outcome: Progressing Goal: Knowledge of patient specific risk factors addressed and post discharge goals established will improve Outcome: Progressing

## 2019-09-10 NOTE — Progress Notes (Signed)
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  MRI showed acute CVA along with chronic changes, neurology team was consulted.  There was also concerns of seizure therefore started on Keppra   Assessment & Plan:   Principal Problem:   Altered mental status Active Problems:   OSA (obstructive sleep apnea)   Hypertension associated with diabetes ([place])   Uncontrolled type 2 diabetes mellitus with hyperglycemia, without long-term current use of insulin ([place])   Coronary artery disease   Hyperlipidemia associated with type 2 diabetes mellitus ([place])   History of CVA (cerebrovascular accident)   Acute encephalopathy   Bilateral pneumonia   Acute encephalopathy Acute seizures, Acute CVA 55mm left parietal white matter.  History of recurrent embolic infarcts/CVA with hemorrhagic conversion in July 2020 -Status post loading dose of Keppra, Keppra 500 mg twice daily -EEG-negative for seizures but shows encephalopathy.  Continue seizure precaution -Neurology team following -Aspirin 81 mg, atorvastatin 40 mg daily -CT head-negative -UA-shows glucosuria but no evidence of infection  Bilateral multifocal infiltrate Aspiration pneumonitis in the setting of seizure -Currently on 5 L nasal cannula which I have weaned down to 3 L.  Chest x-ray shows bilateral patchy pneumonia -COVID-19-negative  -Procalcitonin-negative, BNP Normal.  4 more days of oral doxycycline -Respiratory panel-negative -Echocardiogram March 2020-EF 60 to 65%, mild LVH -I-S//flutter.  Scheduled and as needed bronchodilators ordered  Acute kidney injury, prerenal -Appears to have resolved.  Creatinine at baseline around 1.1  Coronary  artery disease status post PCI -Currently chest pain-free.  Continue aspirin and statin  Essential hypertension -Benzapril on hold due to AKI  D M2, poorly controlled due to hyperglycemia -Home p.o. meds on hold, A1c 9.2 -Sliding scale Accu-Cheks  Hyperlipidemia -Statin  OSA -CPAP  PT-Home health.   DVT prophylaxis: Lovenox Code Status: DNR Family Communication: Spoke with his wife at bedside yesterday Disposition Plan:   Patient From= Home  Patient Anticipated D/C place= Home with home health  Barriers= still slightly confused.  Currently on 3 L nasal cannula, not on any home oxygen.  Should be able to wean him off today and discharge him tomorrow  Subjective: Laying in the bed, no complaints.  Quite confused about the events.  Does report of some coughing and nonproductive phlegm.  He was on 5 L nasal cannula when I walked in the room, I was able to wean him down to 2 L nasal cannula.  Review of Systems Otherwise negative except as per HPI, including: General = no fevers, chills, dizziness,  fatigue HEENT/EYES = negative for loss of vision, double vision, blurred vision,  sore throa Cardiovascular= negative for chest pain, palpitation Respiratory/lungs= negative for  hemoptysis,  Gastrointestinal= negative for nausea, vomiting, abdominal pain Genitourinary= negative for Dysuria MSK = Negative for arthralgia, myalgias Neurology= Negative for headache, numbness, tingling  Psychiatry= Negative for suicidal and homocidal ideation Skin= Negative for Rash   Examination:  Constitutional: Not in acute distress, 4 L nasal cannula weaned down to 3 L nasal cannula. Respiratory: Bilateral rhonchi especially at bases Cardiovascular: Normal sinus rhythm, no rubs Abdomen: Nontender nondistended good bowel sounds Musculoskeletal: No edema noted Skin: No rashes seen Neurologic: CN 2-12 grossly intact.  And nonfocal Psychiatric: Poor judgment  and insight.  Alert to name  only.    Objective: Vitals:   09/10/19 0912 09/10/19 0917 09/10/19 0922 09/10/19 0926  BP:      Pulse:      Resp:      Temp:      TempSrc:      SpO2: 91% 91% 90% 90%  Weight:      Height:        Intake/Output Summary (Last 24 hours) at 09/10/2019 1137 Last data filed at 09/10/2019 0815 Gross per 24 hour  Intake 302.43 ml  Output 800 ml  Net -497.57 ml   Filed Weights   09/08/19 0816  Weight: 90.7 kg     Data Reviewed:   CBC: Recent Labs  Lab 09/07/19 1121 09/07/19 1145 09/09/19 0205 09/10/19 0231  WBC 8.2  --  6.7 5.6  NEUTROABS 7.0  --   --   --   HGB 13.8 13.3 13.2 12.4*  HCT 44.1 39.0 40.2 37.7*  MCV 97.8  --  92.0 92.4  PLT 142*  --  173 [id number]   Basic Metabolic Panel: Recent Labs  Lab 09/07/19 1121 09/07/19 1145 09/08/19 1528 09/09/19 0205 09/10/19 0231  NA 134* 132* 136 135 134*  K 5.0 5.1 4.7 4.5 4.1  CL 101 102 102 102 102  CO2 18*  --  22 17* 20*  GLUCOSE 161* 160* 127* 155* 150*  BUN 25* 29* 25* 26* 26*  CREATININE 1.29* 1.20 1.18 1.16 1.22  CALCIUM 8.0*  --  7.9* 8.0* 8.0*  MG  --   --   --   --  2.2   GFR: Estimated Creatinine Clearance: 65.7 mL/min (by C-G formula based on SCr of 1.22 mg/dL). Liver Function Tests: Recent Labs  Lab 09/07/19 1121 09/08/19 1528 09/09/19 0205 09/10/19 0231  AST 52* 50* 52* 54*  ALT 36 36 36 38  ALKPHOS 52 51 51 51  BILITOT 1.5* 1.7* 1.6* 2.1*  PROT 6.1* 5.7* 5.9* 5.5*  ALBUMIN 3.2* 2.8* 2.9* 2.6*   No results for input(s): LIPASE, AMYLASE in the last 168 hours. No results for input(s): AMMONIA in the last 168 hours. Coagulation Profile: Recent Labs  Lab 09/07/19 1121  INR 1.0   Cardiac Enzymes: No results for input(s): CKTOTAL, CKMB, CKMBINDEX, TROPONINI in the last 168 hours. BNP (last 3 results) No results for input(s): PROBNP in the last 8760 hours. HbA1C: Recent Labs    09/08/19 0350  HGBA1C 9.2*   CBG: Recent Labs  Lab 09/09/19 0846 09/09/19 1316 09/09/19 1554 09/09/19 2109  09/10/19 0746  GLUCAP 155* 162* 134* 160* 154*   Lipid Profile: Recent Labs    09/08/19 0350  CHOL 90  HDL 35*  LDLCALC 40  TRIG 73  CHOLHDL 2.6   Thyroid Function Tests: No results for input(s): TSH, T4TOTAL, FREET4, T3FREE, THYROIDAB in the last 72 hours. Anemia Panel: Recent Labs    09/08/19 1528  FERRITIN 892*   Sepsis Labs: Recent Labs  Lab 09/08/19 1528  PROCALCITON <0.10    Recent Results (from the past 240 hour(s))  Respiratory Panel by PCR     Status: None   Collection Time: 09/08/19  2:57 PM   Specimen: Nasopharyngeal Swab; Respiratory  Result Value Ref Range Status   Adenovirus NOT DETECTED NOT DETECTED Final   Coronavirus 229E NOT DETECTED NOT DETECTED Final    Comment: (NOTE) The Coronavirus on the Respiratory Panel, DOES NOT test for the novel  Coronavirus (2019 nCoV)  Coronavirus HKU1 NOT DETECTED NOT DETECTED Final   Coronavirus NL63 NOT DETECTED NOT DETECTED Final   Coronavirus OC43 NOT DETECTED NOT DETECTED Final   Metapneumovirus NOT DETECTED NOT DETECTED Final   Rhinovirus / Enterovirus NOT DETECTED NOT DETECTED Final   Influenza A NOT DETECTED NOT DETECTED Final   Influenza B NOT DETECTED NOT DETECTED Final   Parainfluenza Virus 1 NOT DETECTED NOT DETECTED Final   Parainfluenza Virus 2 NOT DETECTED NOT DETECTED Final   Parainfluenza Virus 3 NOT DETECTED NOT DETECTED Final   Parainfluenza Virus 4 NOT DETECTED NOT DETECTED Final   Respiratory Syncytial Virus NOT DETECTED NOT DETECTED Final   Bordetella pertussis NOT DETECTED NOT DETECTED Final   Chlamydophila pneumoniae NOT DETECTED NOT DETECTED Final   Mycoplasma pneumoniae NOT DETECTED NOT DETECTED Final    Comment: Performed at [facility], [place] [address] [place], [place] [address]  MRSA PCR Screening     Status: Abnormal   Collection Time: 09/08/19 11:24 PM   Specimen: Nasopharyngeal  Result Value Ref Range Status   MRSA by PCR POSITIVE (A) NEGATIVE Final    Comment:         The GeneXpert MRSA Assay (FDA approved for NASAL specimens only), is one component of a comprehensive MRSA colonization surveillance program. It is not intended to diagnose MRSA infection nor to guide or monitor treatment for MRSA infections. RESULT CALLED TO, READ BACK BY AND VERIFIED WITH: A ROSS RN 09/09/19 0059 JDW Performed at [facility], [place] [address] [place], [place] [address]          Radiology Studies: No results found.      Scheduled Meds: .  stroke: mapping our early stages of recovery book   Does not apply Once  . aspirin EC  81 mg Oral Daily  . atorvastatin  40 mg Oral QHS  . budesonide (PULMICORT) nebulizer solution  0.5 mg Nebulization BID  . Chlorhexidine Gluconate Cloth  6 each Topical Q0600  . doxycycline  100 mg Oral Q12H  . enoxaparin (LOVENOX) injection  40 mg Subcutaneous Q24H  . insulin aspart  0-15 Units Subcutaneous TID WC  . insulin aspart  0-5 Units Subcutaneous QHS  . insulin aspart  2 Units Subcutaneous TID WC  . ipratropium-albuterol  3 mL Nebulization Q6H  . levETIRAcetam  500 mg Oral BID  . mouth rinse  15 mL Mouth Rinse BID  . mupirocin ointment  1 application Nasal BID   Continuous Infusions: . piperacillin-tazobactam 3.375 g (09/10/19 0752)     LOS: 2 days   Time spent= 25 mins    Madoline Bhatt Arsenio Loader, MD [facility]  If 7PM-7AM, please contact night-coverage  09/10/2019, 11:37 AM

## 2019-09-10 NOTE — Progress Notes (Signed)
Physical Therapy Treatment Patient Details Name: Andrew Cobb MRN: [id number] DOB: 09-Feb-1951 Today's Date: 09/10/2019    History of Present Illness Pt is a 69 y/o male admitted secondary to AMS. MRI revealed a questionable acute infarct in the left parietal white matter versus persistent restricted diffusion at the site of a chronic infarct is noted. PMH includes CVA, CAD s/p PCI, DM, HTN, OSA on CPAP, and prostate cancer.     PT Comments    Pt received in bed. Wife present at beginning of session. Discussed discharge plan of home with [facility]. Wife in agreement and reports she can provide needed level of assist. Pt required min/min guard assist bed mobility, min guard assist transfers, and min guard assist ambulation in room with RW. Pt on 4L O2 with desat to 83% after short ambulation distances in room. Pt becoming agitated at inability to ambulate in hallway, with poor understanding of deficits and need for O2. He had removed [place] to eat lunch and SpO2 75% when PT first arrived in room. SpO2 88% at rest on 4L. Pt sat EOB to perform LE exercises prior to returning to supine in bed.    Follow Up Recommendations  Home health PT;Supervision/Assistance - 24 hour     Equipment Recommendations  None recommended by PT    Recommendations for Other Services       Precautions / Restrictions Precautions Precautions: Fall;Other (comment) Precaution Comments: Desat    Mobility  Bed Mobility Overal bed mobility: Needs Assistance Bed Mobility: Supine to Sit;Sit to Supine     Supine to sit: Min assist;HOB elevated Sit to supine: Min guard;HOB elevated   General bed mobility comments: +rail, increased time. Cues for sequencing  Transfers Overall transfer level: Needs assistance Equipment used: Ambulation equipment used Transfers: Sit to/from Stand;Stand Pivot Transfers Sit to Stand: Min guard Stand pivot transfers: Min guard       General transfer comment: cues for  safety/sequencing  Ambulation/Gait Ambulation/Gait assistance: Min guard Gait Distance (Feet): 25 Feet Assistive device: Rolling walker (2 wheeled) Gait Pattern/deviations: Step-through pattern;Decreased stride length Gait velocity: Decreased   General Gait Details: min guard for safety and IV/line management. Ambulated on 4L O2 with desat to 83%. SpO2 88% at rest.   Stairs             Wheelchair Mobility    Modified [name] (Stroke Patients Only) Modified [name] (Stroke Patients Only) Pre-Morbid [name] Score: No symptoms Modified [name]: Moderately severe disability     Balance Overall balance assessment: Needs assistance Sitting-balance support: No upper extremity supported;Feet supported Sitting balance-Leahy Scale: Good     Standing balance support: Bilateral upper extremity supported;No upper extremity supported;During functional activity Standing balance-Leahy Scale: Fair                              Cognition Arousal/Alertness: Awake/alert Behavior During Therapy: Agitated Overall Cognitive Status: Impaired/Different from baseline Area of Impairment: Orientation;Attention;Memory;Safety/judgement;Problem solving;Awareness                 Orientation Level: Disoriented to;Situation Current Attention Level: Selective Memory: Decreased short-term memory;Decreased recall of precautions   Safety/Judgement: Decreased awareness of safety Awareness: Intellectual Problem Solving: Slow processing;Difficulty sequencing;Requires verbal cues General Comments: Pt easily agitated. Being aggressive and curt with therapist. Pt had O2 off when PT entered room. SpO2 75%. Educated on importance of O2 and pt replied "I took it off to eat."      Exercises General  Exercises - Lower Extremity Ankle Circles/Pumps: AROM;Both;20 reps;Seated Long Arc Quad: AROM;Right;Left;10 reps;Seated Hip ABduction/ADduction: AROM;Both;10 reps;Seated Hip Flexion/Marching:  AROM;Right;Left;10 reps;Seated    General Comments General comments (skin integrity, edema, etc.): SpO2 75% on RA on arrival. Pt had removed his O2. With [place] in place on 4L, SpO2 88% at rest and 83% during ambulation.      Pertinent Vitals/Pain Pain Assessment: No/denies pain    Home Living                      Prior Function            PT Goals (current goals can now be found in the care plan section) Acute Rehab PT Goals Patient Stated Goal: home PT Goal Formulation: With patient/family Time For Goal Achievement: 09/22/19 Potential to Achieve Goals: Good Progress towards PT goals: Progressing toward goals    Frequency    Min 4X/week      PT Plan Current plan remains appropriate    Co-evaluation              AM-PAC PT "6 Clicks" Mobility   Outcome Measure  Help needed turning from your back to your side while in a flat bed without using bedrails?: A Little Help needed moving from lying on your back to sitting on the side of a flat bed without using bedrails?: A Little Help needed moving to and from a bed to a chair (including a wheelchair)?: A Little Help needed standing up from a chair using your arms (e.g., wheelchair or bedside chair)?: A Little Help needed to walk in hospital room?: A Little Help needed climbing 3-5 steps with a railing? : A Lot 6 Click Score: 17    End of Session Equipment Utilized During Treatment: Gait belt;Oxygen Activity Tolerance: Patient tolerated treatment well;Treatment limited secondary to agitation Patient left: in bed;with call bell/phone within reach;with bed alarm set Nurse Communication: Mobility status;Other (comment)(desat. Pt removing O2.) PT Visit Diagnosis: Unsteadiness on feet (R26.81);Muscle weakness (generalized) (M62.81)     Time: [id number] PT Time Calculation (min) (ACUTE ONLY): 24 min  Charges:  $Gait Training: 8-22 mins $Therapeutic Exercise: 8-22 mins                     Lorrin Goodell, PT   Office # [phone] Pager [phone]    Lorriane Shire 09/10/2019, 3:01 PM

## 2019-09-11 DIAGNOSIS — J690 Pneumonitis due to inhalation of food and vomit: Secondary | ICD-10-CM

## 2019-09-11 LAB — CBC
HCT: 38.8 % — ABNORMAL LOW (ref 39.0–52.0)
Hemoglobin: 12.7 g/dL — ABNORMAL LOW (ref 13.0–17.0)
MCH: 30.2 pg (ref 26.0–34.0)
MCHC: 32.7 g/dL (ref 30.0–36.0)
MCV: 92.4 fL (ref 80.0–100.0)
Platelets: 205 10*3/uL (ref 150–400)
RBC: 4.2 MIL/uL — ABNORMAL LOW (ref 4.22–5.81)
RDW: 13.3 % (ref 11.5–15.5)
WBC: 5.6 10*3/uL (ref 4.0–10.5)
nRBC: 0 % (ref 0.0–0.2)

## 2019-09-11 LAB — COMPREHENSIVE METABOLIC PANEL
ALT: 36 U/L (ref 0–44)
AST: 49 U/L — ABNORMAL HIGH (ref 15–41)
Albumin: 2.5 g/dL — ABNORMAL LOW (ref 3.5–5.0)
Alkaline Phosphatase: 50 U/L (ref 38–126)
Anion gap: 10 (ref 5–15)
BUN: 20 mg/dL (ref 8–23)
CO2: 24 mmol/L (ref 22–32)
Calcium: 8.1 mg/dL — ABNORMAL LOW (ref 8.9–10.3)
Chloride: 102 mmol/L (ref 98–111)
Creatinine, Ser: 1.12 mg/dL (ref 0.61–1.24)
GFR calc Af Amer: >60 mL/min (ref >60)
GFR calc non Af Amer: >60 mL/min (ref >60)
Glucose, Bld: 167 mg/dL — ABNORMAL HIGH (ref 70–99)
Potassium: 3.9 mmol/L (ref 3.5–5.1)
Sodium: 136 mmol/L (ref 135–145)
Total Bilirubin: 2.3 mg/dL — ABNORMAL HIGH (ref 0.3–1.2)
Total Protein: 5.6 g/dL — ABNORMAL LOW (ref 6.5–8.1)

## 2019-09-11 LAB — GLUCOSE, CAPILLARY
Glucose-Capillary: 166 mg/dL — ABNORMAL HIGH (ref 70–99)
Glucose-Capillary: 166 mg/dL — ABNORMAL HIGH (ref 70–99)
Glucose-Capillary: 192 mg/dL — ABNORMAL HIGH (ref 70–99)
Glucose-Capillary: 180 mg/dL — ABNORMAL HIGH (ref 70–99)

## 2019-09-11 LAB — MAGNESIUM: Magnesium: 2.1 mg/dL (ref 1.7–2.4)

## 2019-09-11 NOTE — Progress Notes (Signed)
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  MRI showed acute CVA along with chronic changes, neurology team was consulted.  There was also concerns of seizure therefore started on Keppra.  CT of the chest without contrast showed groundglass opacity concerns for pneumonitis/pneumonia.   Assessment & Plan:   Principal Problem:   Altered mental status Active Problems:   OSA (obstructive sleep apnea)   Hypertension associated with diabetes ([place])   Uncontrolled type 2 diabetes mellitus with hyperglycemia, without long-term current use of insulin ([place])   Coronary artery disease   Hyperlipidemia associated with type 2 diabetes mellitus ([place])   History of CVA (cerebrovascular accident)   Acute encephalopathy   Bilateral pneumonia   Acute encephalopathy Acute seizures, Acute CVA 51mm left parietal white matter.  History of recurrent embolic infarcts/CVA with hemorrhagic conversion in July 2020 -Status post loading dose of Keppra, Keppra 500 mg twice daily -EEG-negative for seizures but shows encephalopathy.  Continue seizure precaution -Neurology team following -Aspirin 81 mg, atorvastatin 40 mg daily -CT head-negative -UA-shows glucosuria but no evidence of infection  Bilateral multifocal infiltrate Aspiration pneumonitis in the setting of seizure -Continue supplemental oxygen.  Still requiring anywhere from 3-5 L nasal cannula.  Not on home oxygen. -COVID-19-negative  -Procalcitonin-negative, BNP Normal.  -Respiratory panel-negative -Echocardiogram March 2020-EF 60 to 65%, mild LVH -I-S//flutter.  Scheduled and as needed bronchodilators -CT chest consistent with bilateral  infiltrates-aspiration? -Speech evaluation, may need modified barium swallow. -Aspiration precautions  Acute kidney injury, prerenal -Appears to have resolved.  Creatinine at baseline around 1.1  Coronary artery disease status post PCI -Currently chest pain-free.  Continue aspirin and statin  Essential hypertension -Benzapril on hold due to AKI  D M2, poorly controlled due to hyperglycemia -Home p.o. meds on hold, A1c 9.2 -Sliding scale Accu-Cheks  Hyperlipidemia -Statin  OSA -CPAP  PT-Home health.   DVT prophylaxis: Lovenox Code Status: DNR Family Communication: Spoke with his wife over the phone. Disposition Plan:   Patient From= Home  Patient Anticipated D/C place= Home with home health  Barriers= patient is hypoxic, I am concerned that he is having aspiration pneumonitis.  Will need speech and swallow evaluation to further delineate this problem prior to safely discharging him.  Subjective: No complaints, still hypoxic requiring supplemental oxygen as high as 5 L nasal cannula.  Pleasantly confused.  Knows his name and follows all commands.  Review of Systems Otherwise negative except as per HPI, including: General = no fevers, chills, dizziness,  fatigue HEENT/EYES = negative for loss of vision, double vision, blurred vision,  sore throa Cardiovascular= negative for chest pain, palpitation Respiratory/lungs= negative for wheezing; hemoptysis,  Gastrointestinal= negative for nausea, vomiting, abdominal pain Genitourinary= negative for Dysuria MSK = Negative for arthralgia, myalgias Neurology= Negative for headache, numbness, tingling  Psychiatry= Negative for suicidal and homocidal ideation Skin= Negative for Rash   Examination:  Constitutional: Not in acute distress, 5 L nasal cannula.  Pleasantly confused Respiratory: C bilateral rhonchi Cardiovascular: Normal sinus rhythm, no rubs Abdomen: Nontender nondistended good bowel sounds Musculoskeletal: No  edema noted Skin: No rashes seen Neurologic: CN 2-12 grossly intact.  And nonfocal.  Follows all the commands Psychiatric: Alert to name only.  Poor  judgment and insight    Objective: Vitals:   09/10/19 2253 09/10/19 2300 09/11/19 0714 09/11/19 0725  BP: (!) 110/59   120/72  Pulse: 91   74  Resp: (!) 22   20  Temp: 100 F (37.8 C)   99.1 F (37.3 C)  TempSrc: Oral   Oral  SpO2:  91% 93% 93%  Weight:      Height:        Intake/Output Summary (Last 24 hours) at 09/11/2019 1046 Last data filed at 09/11/2019 0500 Gross per 24 hour  Intake 250 ml  Output 875 ml  Net -625 ml   Filed Weights   09/08/19 0816  Weight: 90.7 kg     Data Reviewed:   CBC: Recent Labs  Lab 09/07/19 1121 09/07/19 1145 09/09/19 0205 09/10/19 0231 09/11/19 0144  WBC 8.2  --  6.7 5.6 5.6  NEUTROABS 7.0  --   --   --   --   HGB 13.8 13.3 13.2 12.4* 12.7*  HCT 44.1 39.0 40.2 37.7* 38.8*  MCV 97.8  --  92.0 92.4 92.4  PLT 142*  --  173 191 [id number]   Basic Metabolic Panel: Recent Labs  Lab 09/07/19 1121 09/07/19 1121 09/07/19 1145 09/08/19 1528 09/09/19 0205 09/10/19 0231 09/11/19 0144  NA 134*   < > 132* 136 135 134* 136  K 5.0   < > 5.1 4.7 4.5 4.1 3.9  CL 101   < > 102 102 102 102 102  CO2 18*  --   --  22 17* 20* 24  GLUCOSE 161*   < > 160* 127* 155* 150* 167*  BUN 25*   < > 29* 25* 26* 26* 20  CREATININE 1.29*   < > 1.20 1.18 1.16 1.22 1.12  CALCIUM 8.0*  --   --  7.9* 8.0* 8.0* 8.1*  MG  --   --   --   --   --  2.2 2.1   < > = values in this interval not displayed.   GFR: Estimated Creatinine Clearance: 71.5 mL/min (by C-G formula based on SCr of 1.12 mg/dL). Liver Function Tests: Recent Labs  Lab 09/07/19 1121 09/08/19 1528 09/09/19 0205 09/10/19 0231 09/11/19 0144  AST 52* 50* 52* 54* 49*  ALT 36 36 36 38 36  ALKPHOS 52 51 51 51 50  BILITOT 1.5* 1.7* 1.6* 2.1* 2.3*  PROT 6.1* 5.7* 5.9* 5.5* 5.6*  ALBUMIN 3.2* 2.8* 2.9* 2.6* 2.5*   No results for input(s): LIPASE,  AMYLASE in the last 168 hours. No results for input(s): AMMONIA in the last 168 hours. Coagulation Profile: Recent Labs  Lab 09/07/19 1121  INR 1.0   Cardiac Enzymes: No results for input(s): CKTOTAL, CKMB, CKMBINDEX, TROPONINI in the last 168 hours. BNP (last 3 results) No results for input(s): PROBNP in the last 8760 hours. HbA1C: No results for input(s): HGBA1C in the last 72 hours. CBG: Recent Labs  Lab 09/10/19 0746 09/10/19 1221 09/10/19 1539 09/10/19 2120 09/11/19 0726  GLUCAP 154* 255* 217* 138* 166*   Lipid Profile: No results for input(s): CHOL, HDL, LDLCALC, TRIG, CHOLHDL, LDLDIRECT in the last 72 hours. Thyroid Function Tests: No results for input(s): TSH, T4TOTAL, FREET4, T3FREE, THYROIDAB in the last 72 hours. Anemia Panel: Recent Labs    09/08/19 1528  FERRITIN 892*   Sepsis Labs: Recent Labs  Lab 09/08/19 1528  PROCALCITON <0.10    Recent Results (from the past 240 hour(s))  Respiratory Panel by PCR  Status: None   Collection Time: 09/08/19  2:57 PM   Specimen: Nasopharyngeal Swab; Respiratory  Result Value Ref Range Status   Adenovirus NOT DETECTED NOT DETECTED Final   Coronavirus 229E NOT DETECTED NOT DETECTED Final    Comment: (NOTE) The Coronavirus on the Respiratory Panel, DOES NOT test for the novel  Coronavirus (2019 nCoV)    Coronavirus HKU1 NOT DETECTED NOT DETECTED Final   Coronavirus NL63 NOT DETECTED NOT DETECTED Final   Coronavirus OC43 NOT DETECTED NOT DETECTED Final   Metapneumovirus NOT DETECTED NOT DETECTED Final   Rhinovirus / Enterovirus NOT DETECTED NOT DETECTED Final   Influenza A NOT DETECTED NOT DETECTED Final   Influenza B NOT DETECTED NOT DETECTED Final   Parainfluenza Virus 1 NOT DETECTED NOT DETECTED Final   Parainfluenza Virus 2 NOT DETECTED NOT DETECTED Final   Parainfluenza Virus 3 NOT DETECTED NOT DETECTED Final   Parainfluenza Virus 4 NOT DETECTED NOT DETECTED Final   Respiratory Syncytial Virus NOT  DETECTED NOT DETECTED Final   Bordetella pertussis NOT DETECTED NOT DETECTED Final   Chlamydophila pneumoniae NOT DETECTED NOT DETECTED Final   Mycoplasma pneumoniae NOT DETECTED NOT DETECTED Final    Comment: Performed at [facility], [address], [place], [place] [address]  MRSA PCR Screening     Status: Abnormal   Collection Time: 09/08/19 11:24 PM   Specimen: Nasopharyngeal  Result Value Ref Range Status   MRSA by PCR POSITIVE (A) NEGATIVE Final    Comment:        The GeneXpert MRSA Assay (FDA approved for NASAL specimens only), is one component of a comprehensive MRSA colonization surveillance program. It is not intended to diagnose MRSA infection nor to guide or monitor treatment for MRSA infections. RESULT CALLED TO, READ BACK BY AND VERIFIED WITH: A ROSS RN 09/09/19 0059 JDW Performed at [facility], [place] [address], [place], [place] [address]          Radiology Studies: CT CHEST WO CONTRAST  Result Date: 09/10/2019 CLINICAL DATA:  Aspiration in the setting of seizure EXAM: CT CHEST WITHOUT CONTRAST TECHNIQUE: Multidetector CT imaging of the chest was performed following the standard protocol without IV contrast. COMPARISON:  Chest radiograph 09/08/2019 FINDINGS: Cardiovascular: Three-vessel coronary artery disease is noted. Normal heart size. No pericardial effusion. Atherosclerotic plaque within the normal caliber aorta. Shared origin of the brachiocephalic and left common carotid artery with mild plaque in the proximal great vessels. Central pulmonary arteries are normal caliber. Luminal evaluation precluded in the absence of intravenous contrast media. Mediastinum/Nodes: No mediastinal fluid or gas. Normal thyroid gland and thoracic inlet. No acute abnormality of the trachea or esophagus. Posterior bowing of the trachea likely reflects imaging during exhalation. No worrisome mediastinal or axillary adenopathy. Hilar nodal evaluation is limited in the absence  of intravenous contrast media. Lungs/Pleura: Widespread multifocal areas of ground-glass and mixed consolidation with a basilar and peripheral predominance. No pneumothorax or effusion. Evaluation for underlying pulmonary nodules is limited in the setting of extensive parenchymal disease. 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 Prior cholecystectomy noted as well. No acute abnormalities present in the visualized portions of the upper abdomen. Musculoskeletal: Multilevel degenerative changes are present in the imaged portions of the spine. Prior cervical fusion without gross abnormality at this time. Left chest wall implantable loop recorder is noted. IMPRESSION: Widespread areas of mixed ground-glass and consolidative opacity. Findings could be seen in the setting of aspiration pneumonia/pneumonitis. However, please note such  a pattern could also be seen with atypical viral infections including COVID-19 with a lack of fluid-filled airways at least warranting consideration of a differential. Aortic Atherosclerosis (ICD10-I70.0). Electronically Signed   By: Lovena Le M.D.   On: 09/10/2019 17:05        Scheduled Meds: .  stroke: mapping our early stages of recovery book   Does not apply Once  . aspirin EC  81 mg Oral Daily  . atorvastatin  40 mg Oral QHS  . budesonide (PULMICORT) nebulizer solution  0.5 mg Nebulization BID  . Chlorhexidine Gluconate Cloth  6 each Topical Q0600  . enoxaparin (LOVENOX) injection  40 mg Subcutaneous Q24H  . insulin aspart  0-15 Units Subcutaneous TID WC  . insulin aspart  0-5 Units Subcutaneous QHS  . insulin aspart  2 Units Subcutaneous TID WC  . ipratropium-albuterol  3 mL Nebulization TID  . levETIRAcetam  500 mg Oral BID  . mouth rinse  15 mL Mouth Rinse BID  . mupirocin ointment  1 application Nasal BID   Continuous Infusions: . piperacillin-tazobactam 3.375 g (09/11/19 1010)     LOS: 3 days   Time spent= 25 mins    Kuper Rennels  Arsenio Loader, MD [facility]  If 7PM-7AM, please contact night-coverage  09/11/2019, 10:46 AM

## 2019-09-12 LAB — CBC
HCT: 36.5 % — ABNORMAL LOW (ref 39.0–52.0)
Hemoglobin: 12 g/dL — ABNORMAL LOW (ref 13.0–17.0)
MCH: 30.2 pg (ref 26.0–34.0)
MCHC: 32.9 g/dL (ref 30.0–36.0)
MCV: 91.7 fL (ref 80.0–100.0)
Platelets: 212 10*3/uL (ref 150–400)
RBC: 3.98 MIL/uL — ABNORMAL LOW (ref 4.22–5.81)
RDW: 13.2 % (ref 11.5–15.5)
WBC: 5.1 10*3/uL (ref 4.0–10.5)
nRBC: 0 % (ref 0.0–0.2)

## 2019-09-12 LAB — GLUCOSE, CAPILLARY
Glucose-Capillary: 169 mg/dL — ABNORMAL HIGH (ref 70–99)
Glucose-Capillary: 229 mg/dL — ABNORMAL HIGH (ref 70–99)
Glucose-Capillary: 145 mg/dL — ABNORMAL HIGH (ref 70–99)
Glucose-Capillary: 220 mg/dL — ABNORMAL HIGH (ref 70–99)
Glucose-Capillary: 194 mg/dL — ABNORMAL HIGH (ref 70–99)
Glucose-Capillary: 221 mg/dL — ABNORMAL HIGH (ref 70–99)

## 2019-09-12 LAB — COMPREHENSIVE METABOLIC PANEL
ALT: 41 U/L (ref 0–44)
AST: 55 U/L — ABNORMAL HIGH (ref 15–41)
Albumin: 2.2 g/dL — ABNORMAL LOW (ref 3.5–5.0)
Alkaline Phosphatase: 51 U/L (ref 38–126)
Anion gap: 12 (ref 5–15)
BUN: 20 mg/dL (ref 8–23)
CO2: 24 mmol/L (ref 22–32)
Calcium: 8 mg/dL — ABNORMAL LOW (ref 8.9–10.3)
Chloride: 99 mmol/L (ref 98–111)
Creatinine, Ser: 1.11 mg/dL (ref 0.61–1.24)
GFR calc Af Amer: >60 mL/min (ref >60)
GFR calc non Af Amer: >60 mL/min (ref >60)
Glucose, Bld: 193 mg/dL — ABNORMAL HIGH (ref 70–99)
Potassium: 3.7 mmol/L (ref 3.5–5.1)
Sodium: 135 mmol/L (ref 135–145)
Total Bilirubin: 1.8 mg/dL — ABNORMAL HIGH (ref 0.3–1.2)
Total Protein: 5.6 g/dL — ABNORMAL LOW (ref 6.5–8.1)

## 2019-09-12 LAB — MAGNESIUM: Magnesium: 2 mg/dL (ref 1.7–2.4)

## 2019-09-12 NOTE — Progress Notes (Addendum)
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  MRI showed acute CVA along with chronic changes, neurology team was consulted.  There was also concerns of seizure therefore started on Keppra.  CT of the chest without contrast showed groundglass opacity concerns for pneumonitis/pneumonia.   Assessment & Plan:   Principal Problem:   Altered mental status Active Problems:   OSA (obstructive sleep apnea)   Hypertension associated with diabetes ([place])   Uncontrolled type 2 diabetes mellitus with hyperglycemia, without long-term current use of insulin ([facility])   Coronary artery disease   Hyperlipidemia associated with type 2 diabetes mellitus ([facility])   History of CVA (cerebrovascular accident)   Acute encephalopathy   Bilateral pneumonia  Acute encephalopathy, persist Acute seizures, Acute CVA 38mm left parietal white matter per MRI but neurology thinks is chronic.  History of recurrent embolic infarcts/CVA with hemorrhagic conversion in July 2020 -Status post loading dose of Keppra, Keppra 500 mg twice daily -EEG-negative for seizures but shows encephalopathy.  Continue seizure precaution -Per neurology team patient has chronic CVA. -Aspirin 81 mg, atorvastatin 40 mg daily -CT head-negative -UA-shows glucosuria but no evidence of infection  Bilateral multifocal infiltrate Aspiration pneumonitis/pneumonia in the setting of seizure -Continue supplemental oxygen.  Still requiring anywhere from 3-5 L nasal cannula.  Not on home oxygen. -COVID-19-negative  -Procalcitonin-negative, BNP Normal.  Discontinue Zosyn after today -Respiratory panel-negative -Echocardiogram March 2020-EF 60 to 65%, mild  LVH -I-S//flutter.  Scheduled and as needed bronchodilators -CT chest consistent with bilateral infiltrates-aspiration? -Speech and swallow evaluation today.  In the meantime aspiration precautions.  Acute kidney injury, prerenal -Appears to have resolved.  Creatinine at baseline around 1.1  Coronary artery disease status post PCI -Currently chest pain-free.  Continue aspirin and statin  Essential hypertension -Benzapril on hold due to AKI  D M2, poorly controlled due to hyperglycemia -Home p.o. meds on hold, A1c 9.2 -Sliding scale Accu-Cheks  Hyperlipidemia -Statin  OSA -CPAP  PT-Home health.   DVT prophylaxis: Lovenox Code Status: DNR Family Communication: Wife updated.  Disposition Plan:   Patient From= Home  Patient Anticipated D/C place= Home with home health  Barriers= still remains hypoxic requiring 3-5 L nasal cannula.  Not on home oxygen.  Concerns about aspiration.  Subjective: Pleasantly confused, partly ate his breakfast.  Supplemental oxygen due to overnight hypoxia.  Review of Systems Otherwise negative except as per HPI, including: General = no fevers, chills, dizziness,  fatigue HEENT/EYES = negative for loss of vision, double vision, blurred vision,  sore throa Cardiovascular= negative for chest pain, palpitation Respiratory/lungs= negative for , wheezing; hemoptysis,  Gastrointestinal= negative for nausea, vomiting, abdominal pain Genitourinary= negative for Dysuria MSK = Negative for arthralgia, myalgias Neurology= Negative for headache, numbness, tingling  Psychiatry= Negative for suicidal and homocidal ideation Skin= Negative for Rash    Examination: Constitutional: Not in acute distress, on 5 L nasal cannula Respiratory: Bilateral rhonchi Cardiovascular: Normal sinus rhythm, no rubs Abdomen: Nontender nondistended good bowel sounds Musculoskeletal: No edema noted Skin: No rashes seen Neurologic: CN 2-12 grossly intact.  And nonfocal.   Follows all the commands Psychiatric: Poor judgment and insight.  Alert to name and place   Objective: Vitals:   09/11/19 2040 09/11/19  2305 09/12/19 0845 09/12/19 0903  BP:  121/70 124/72   Pulse:  91 83   Resp:  20 19   Temp:  98.7 F (37.1 C) 98 F (36.7 C)   TempSrc:  Oral Oral   SpO2: 95% 90% 90% 91%  Weight:      Height:        Intake/Output Summary (Last 24 hours) at 09/12/2019 1047 Last data filed at 09/11/2019 2100 Gross per 24 hour  Intake 240 ml  Output --  Net 240 ml   Filed Weights   09/08/19 0816  Weight: 90.7 kg     Data Reviewed:   CBC: Recent Labs  Lab 09/07/19 1121 09/07/19 1121 09/07/19 1145 09/09/19 0205 09/10/19 0231 09/11/19 0144 09/12/19 0510  WBC 8.2  --   --  6.7 5.6 5.6 5.1  NEUTROABS 7.0  --   --   --   --   --   --   HGB 13.8   < > 13.3 13.2 12.4* 12.7* 12.0*  HCT 44.1   < > 39.0 40.2 37.7* 38.8* 36.5*  MCV 97.8  --   --  92.0 92.4 92.4 91.7  PLT 142*  --   --  173 191 205 212   < > = values in this interval not displayed.   Basic Metabolic Panel: Recent Labs  Lab 09/08/19 1528 09/09/19 0205 09/10/19 0231 09/11/19 0144 09/12/19 0408  NA 136 135 134* 136 135  K 4.7 4.5 4.1 3.9 3.7  CL 102 102 102 102 99  CO2 22 17* 20* 24 24  GLUCOSE 127* 155* 150* 167* 193*  BUN 25* 26* 26* 20 20  CREATININE 1.18 1.16 1.22 1.12 1.11  CALCIUM 7.9* 8.0* 8.0* 8.1* 8.0*  MG  --   --  2.2 2.1 2.0   GFR: Estimated Creatinine Clearance: 72.2 mL/min (by C-G formula based on SCr of 1.11 mg/dL). Liver Function Tests: Recent Labs  Lab 09/08/19 1528 09/09/19 0205 09/10/19 0231 09/11/19 0144 09/12/19 0408  AST 50* 52* 54* 49* 55*  ALT 36 36 38 36 41  ALKPHOS 51 51 51 50 51  BILITOT 1.7* 1.6* 2.1* 2.3* 1.8*  PROT 5.7* 5.9* 5.5* 5.6* 5.6*  ALBUMIN 2.8* 2.9* 2.6* 2.5* 2.2*   No results for input(s): LIPASE, AMYLASE in the last 168 hours. No results for input(s): AMMONIA in the last 168 hours. Coagulation Profile: Recent Labs  Lab  09/07/19 1121  INR 1.0   Cardiac Enzymes: No results for input(s): CKTOTAL, CKMB, CKMBINDEX, TROPONINI in the last 168 hours. BNP (last 3 results) No results for input(s): PROBNP in the last 8760 hours. HbA1C: No results for input(s): HGBA1C in the last 72 hours. CBG: Recent Labs  Lab 09/11/19 0726 09/11/19 1158 09/11/19 1637 09/11/19 2109 09/12/19 0834  GLUCAP 166* 166* 192* 180* 169*   Lipid Profile: No results for input(s): CHOL, HDL, LDLCALC, TRIG, CHOLHDL, LDLDIRECT in the last 72 hours. Thyroid Function Tests: No results for input(s): TSH, T4TOTAL, FREET4, T3FREE, THYROIDAB in the last 72 hours. Anemia Panel: No results for input(s): VITAMINB12, FOLATE, FERRITIN, TIBC, IRON, RETICCTPCT in the last 72 hours. Sepsis Labs: Recent Labs  Lab 09/08/19 1528  PROCALCITON <0.10    Recent Results (from the past 240 hour(s))  Respiratory Panel by PCR     Status: None   Collection Time: 09/08/19  2:57 PM   Specimen: Nasopharyngeal Swab; Respiratory  Result Value Ref Range Status   Adenovirus NOT DETECTED NOT DETECTED Final  Coronavirus 229E NOT DETECTED NOT DETECTED Final    Comment: (NOTE) The Coronavirus on the Respiratory Panel, DOES NOT test for the novel  Coronavirus (2019 nCoV)    Coronavirus HKU1 NOT DETECTED NOT DETECTED Final   Coronavirus NL63 NOT DETECTED NOT DETECTED Final   Coronavirus OC43 NOT DETECTED NOT DETECTED Final   Metapneumovirus NOT DETECTED NOT DETECTED Final   Rhinovirus / Enterovirus NOT DETECTED NOT DETECTED Final   Influenza A NOT DETECTED NOT DETECTED Final   Influenza B NOT DETECTED NOT DETECTED Final   Parainfluenza Virus 1 NOT DETECTED NOT DETECTED Final   Parainfluenza Virus 2 NOT DETECTED NOT DETECTED Final   Parainfluenza Virus 3 NOT DETECTED NOT DETECTED Final   Parainfluenza Virus 4 NOT DETECTED NOT DETECTED Final   Respiratory Syncytial Virus NOT DETECTED NOT DETECTED Final   Bordetella pertussis NOT DETECTED NOT DETECTED Final    Chlamydophila pneumoniae NOT DETECTED NOT DETECTED Final   Mycoplasma pneumoniae NOT DETECTED NOT DETECTED Final    Comment: Performed at [facility], [place] [address] [place], [place] [address]  MRSA PCR Screening     Status: Abnormal   Collection Time: 09/08/19 11:24 PM   Specimen: Nasopharyngeal  Result Value Ref Range Status   MRSA by PCR POSITIVE (A) NEGATIVE Final    Comment:        The GeneXpert MRSA Assay (FDA approved for NASAL specimens only), is one component of a comprehensive MRSA colonization surveillance program. It is not intended to diagnose MRSA infection nor to guide or monitor treatment for MRSA infections. RESULT CALLED TO, READ BACK BY AND VERIFIED WITH: A ROSS RN 09/09/19 0059 JDW Performed at [facility], [place] [address] [place], [place] [address]          Radiology Studies: CT CHEST WO CONTRAST  Result Date: 09/10/2019 CLINICAL DATA:  Aspiration in the setting of seizure EXAM: CT CHEST WITHOUT CONTRAST TECHNIQUE: Multidetector CT imaging of the chest was performed following the standard protocol without IV contrast. COMPARISON:  Chest radiograph 09/08/2019 FINDINGS: Cardiovascular: Three-vessel coronary artery disease is noted. Normal heart size. No pericardial effusion. Atherosclerotic plaque within the normal caliber aorta. Shared origin of the brachiocephalic and left common carotid artery with mild plaque in the proximal great vessels. Central pulmonary arteries are normal caliber. Luminal evaluation precluded in the absence of intravenous contrast media. Mediastinum/Nodes: No mediastinal fluid or gas. Normal thyroid gland and thoracic inlet. No acute abnormality of the trachea or esophagus. Posterior bowing of the trachea likely reflects imaging during exhalation. No worrisome mediastinal or axillary adenopathy. Hilar nodal evaluation is limited in the absence of intravenous contrast media. Lungs/Pleura: Widespread multifocal areas of  ground-glass and mixed consolidation with a basilar and peripheral predominance. No pneumothorax or effusion. Evaluation for underlying pulmonary nodules is limited in the setting of extensive parenchymal disease. 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 Prior cholecystectomy noted as well. No acute abnormalities present in the visualized portions of the upper abdomen. Musculoskeletal: Multilevel degenerative changes are present in the imaged portions of the spine. Prior cervical fusion without gross abnormality at this time. Left chest wall implantable loop recorder is noted. IMPRESSION: Widespread areas of mixed ground-glass and consolidative opacity. Findings could be seen in the setting of aspiration pneumonia/pneumonitis. However, please note such a pattern could also be seen with atypical viral infections including COVID-19 with a lack of fluid-filled airways at least warranting consideration of a differential. Aortic Atherosclerosis (ICD10-I70.0). Electronically Signed  By: Lovena Le M.D.   On: 09/10/2019 17:05        Scheduled Meds: .  stroke: mapping our early stages of recovery book   Does not apply Once  . aspirin EC  81 mg Oral Daily  . atorvastatin  40 mg Oral QHS  . budesonide (PULMICORT) nebulizer solution  0.5 mg Nebulization BID  . Chlorhexidine Gluconate Cloth  6 each Topical Q0600  . enoxaparin (LOVENOX) injection  40 mg Subcutaneous Q24H  . insulin aspart  0-15 Units Subcutaneous TID WC  . insulin aspart  0-5 Units Subcutaneous QHS  . insulin aspart  2 Units Subcutaneous TID WC  . ipratropium-albuterol  3 mL Nebulization TID  . levETIRAcetam  500 mg Oral BID  . mouth rinse  15 mL Mouth Rinse BID  . mupirocin ointment  1 application Nasal BID   Continuous Infusions: . piperacillin-tazobactam 3.375 g (09/12/19 0955)     LOS: 4 days   Time spent= 25 mins    Marguarite Markov Arsenio Loader, MD [facility]  If 7PM-7AM, please contact  night-coverage  09/12/2019, 10:47 AM

## 2019-09-12 NOTE — TOC Initial Note (Signed)
Transition of Care [facility]) - Initial/Assessment Note    Patient Details  Name: NESHAWN SIEVE MRN: [id number] Date of Birth: 1951/03/06  Transition of Care [facility]) CM/SW Contact:    Pollie Friar, RN Phone Number: 09/12/2019, 2:34 PM  Clinical Narrative:                 Recommendations are for [facility] services. CM provided choice and patient doesn't have a selection. CM will work on finding a Acupuncturist.  No DME needs.  TOC following for further d/c needs.   Expected Discharge Plan: [place] Barriers to Discharge: Continued Medical Work up   Patient Goals and CMS Choice   CMS Medicare.gov Compare Post Acute Care list provided to:: Patient Choice offered to / list presented to : Patient  Expected Discharge Plan and Services Expected Discharge Plan: [place]   Discharge Planning Services: CM Consult Post Acute Care Choice: [place] arrangements for the past 2 months: Single Family Home                                      Prior Living Arrangements/Services Living arrangements for the past 2 months: Single Family Home Lives with:: Spouse Patient language and need for interpreter reviewed:: Yes Do you feel safe going back to the place where you live?: Yes      Need for Family Participation in Patient Care: Yes (Comment) Care giver support system in place?: Yes (comment)(pt states spouse can provide supervision)   Criminal Activity/Legal Involvement Pertinent to Current Situation/Hospitalization: No - Comment as needed  Activities of Daily Living Home Assistive Devices/Equipment: CPAP ADL Screening (condition at time of admission) Patient's cognitive ability adequate to safely complete daily activities?: Yes Is the patient deaf or have difficulty hearing?: No Does the patient have difficulty seeing, even when wearing glasses/contacts?: No Does the patient have difficulty concentrating, remembering, or making decisions?:  No Patient able to express need for assistance with ADLs?: No Does the patient have difficulty dressing or bathing?: No Independently performs ADLs?: Yes (appropriate for developmental age) Does the patient have difficulty walking or climbing stairs?: No Weakness of Legs: Both Weakness of Arms/Hands: None  Permission Sought/Granted                  Emotional Assessment Appearance:: Appears stated age Attitude/Demeanor/Rapport: Engaged Affect (typically observed): Accepting Orientation: : Oriented to Self, Oriented to Place, Oriented to  Time, Oriented to Situation   Psych Involvement: No (comment)  Admission diagnosis:  Altered mental status [R41.82] Bilateral pneumonia [J18.9] Respiratory failure with hypoxia ([place]) [J96.91] Acute encephalopathy [G93.40] Patient Active Problem List   Diagnosis Date Noted  . Acute encephalopathy 09/08/2019  . Bilateral pneumonia 09/08/2019  . Altered mental status 09/07/2019  . History of CVA (cerebrovascular accident) 09/07/2019  . Morbid obesity ([place]) 06/19/2019  . Hyperlipidemia associated with type 2 diabetes mellitus ([place]) 06/19/2019  . Expressive aphasia 06/19/2019  . Vitamin D deficiency 06/19/2019  . Bradycardia with 31-40 beats per minute 02/08/2019  . Chronic venous insufficiency   . Asthma   . Arthritis   . GERD (gastroesophageal reflux disease)   . Hypogonadism in male   . Cryptogenic stroke ([place])   . Thrombocythemia ([place])   . 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  H/O right coronary artery stent placement   . Allergic rhinitis 08/19/2018  . Cervical radiculopathy 08/17/2017  . Advanced care planning/counseling discussion 07/21/2017  . History of prostate cancer 12/08/2016  . H/O bariatric surgery 07/17/2016  . Hypertension associated with diabetes ([place]) 04/24/2015  . Uncontrolled type 2 diabetes mellitus with  hyperglycemia, without long-term current use of insulin ([place]) 04/24/2015  . Chronic sinusitis 08/13/2009  . Coronary atherosclerosis 04/02/2009  . 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 OSA (obstructive sleep apnea) 04/02/2009  . Coronary artery disease 1996   PCP:  Guadalupe Maple, MD Pharmacy:   [facility] [address] ([address]), [place]) [place] [address] Phone: [phone] Fax: [phone]  [place] [facility] - [place], [place] [address] Phone: [phone] Fax: [phone]     Social Determinants of Health (SDOH) Interventions    Readmission Risk Interventions No flowsheet data found.

## 2019-09-12 NOTE — Progress Notes (Signed)
Physical Therapy Treatment Patient Details Name: Andrew Cobb MRN: [id number] DOB: 1951-01-07 Today's Date: 09/12/2019    History of Present Illness Pt is a 69 y/o male admitted secondary to AMS. MRI revealed a questionable acute infarct in the left parietal white matter versus persistent restricted diffusion at the site of a chronic infarct is noted. PMH includes CVA, CAD s/p PCI, DM, HTN, OSA on CPAP, and prostate cancer.     PT Comments    Pt found in bed with IV line, O2 line and phone cord wrapped around him. Pt untangled and agreeable to ambulation but not sitting up in chair. Pt is very flat and only answers yes/no questions. Pt is limited in safe mobility by decreased cognition and increased O2 demand in presence of generalized weakness and decreased endurance. Pt is min guard for bed mobility and transfers and requires minA for management of RW. D/c plans remain appropriate. PT will continue to follow acutely.    Follow Up Recommendations  Home health PT;Supervision/Assistance - 24 hour     Equipment Recommendations  None recommended by PT       Precautions / Restrictions Precautions Precautions: Fall;Other (comment) Precaution Comments: Desat Restrictions Weight Bearing Restrictions: No    Mobility  Bed Mobility Overal bed mobility: Needs Assistance Bed Mobility: Supine to Sit;Sit to Supine     Supine to sit: HOB elevated;Min guard Sit to supine: Min guard;HOB elevated   General bed mobility comments: +rail, increased time. Cues for sequencing  Transfers Overall transfer level: Needs assistance Equipment used: Ambulation equipment used Transfers: Sit to/from Stand;Stand Pivot Transfers Sit to Stand: Min guard Stand pivot transfers: Min guard       General transfer comment: cues for safety/sequencing  Ambulation/Gait Ambulation/Gait assistance: Min assist Gait Distance (Feet): 40 Feet Assistive device: Rolling walker (2 wheeled) Gait  Pattern/deviations: Step-through pattern;Decreased stride length Gait velocity: Decreased Gait velocity interpretation: <1.31 ft/sec, indicative of household ambulator General Gait Details: min A and maximal multimodal cuing for management of RW to turn, pt with increasingly slower gait as approaching the room      Modified [name] (Stroke Patients Only) Modified [name] (Stroke Patients Only) Pre-Morbid [name] Score: No symptoms Modified [name]: Moderately severe disability     Balance Overall balance assessment: Needs assistance Sitting-balance support: No upper extremity supported;Feet supported Sitting balance-Leahy Scale: Good     Standing balance support: Bilateral upper extremity supported;No upper extremity supported;During functional activity Standing balance-Leahy Scale: Fair Standing balance comment: reliant on UE support                            Cognition Arousal/Alertness: Awake/alert Behavior During Therapy: Flat affect;Restless Overall Cognitive Status: Impaired/Different from baseline Area of Impairment: Orientation;Attention;Memory;Safety/judgement;Problem solving;Awareness                 Orientation Level: Disoriented to;Situation Current Attention Level: Selective Memory: Decreased short-term memory;Decreased recall of precautions   Safety/Judgement: Decreased awareness of safety Awareness: Intellectual Problem Solving: Slow processing;Difficulty sequencing;Requires verbal cues General Comments: Pt with limited vocalization, however at end of session pt repeats "oh my body" and when asked if he has pain he replies "yes" RN notified         General Comments General comments (skin integrity, edema, etc.): Pt on 3L O2       Pertinent Vitals/Pain Pain Assessment: No/denies pain           PT Goals (current goals can now be found  in the care plan section) Acute Rehab PT Goals Patient Stated Goal: home PT Goal Formulation: With  patient/family Time For Goal Achievement: 09/22/19 Potential to Achieve Goals: Good Progress towards PT goals: Progressing toward goals    Frequency    Min 4X/week      PT Plan Current plan remains appropriate       AM-PAC PT "6 Clicks" Mobility   Outcome Measure  Help needed turning from your back to your side while in a flat bed without using bedrails?: A Little Help needed moving from lying on your back to sitting on the side of a flat bed without using bedrails?: A Little Help needed moving to and from a bed to a chair (including a wheelchair)?: A Little Help needed standing up from a chair using your arms (e.g., wheelchair or bedside chair)?: A Little Help needed to walk in hospital room?: A Little Help needed climbing 3-5 steps with a railing? : A Lot 6 Click Score: 17    End of Session Equipment Utilized During Treatment: Gait belt;Oxygen Activity Tolerance: Patient tolerated treatment well;Treatment limited secondary to agitation Patient left: in bed;with call bell/phone within reach;with bed alarm set Nurse Communication: Mobility status;Patient requests pain meds PT Visit Diagnosis: Unsteadiness on feet (R26.81);Muscle weakness (generalized) (M62.81)     Time: [id number] PT Time Calculation (min) (ACUTE ONLY): 22 min  Charges:  $Gait Training: 8-22 mins                     Edrick Whitehorn B. Migdalia Dk PT, DPT Acute Rehabilitation Services Pager [phone] Office [phone]    [place] 09/12/2019, 4:56 PM

## 2019-09-12 NOTE — Progress Notes (Signed)
Occupational Therapy Treatment Patient Details Name: Andrew Cobb MRN: [id number] DOB: 09/21/1950 Today's Date: 09/12/2019    History of present illness Pt is a 69 y/o male admitted secondary to AMS. MRI revealed a questionable acute infarct in the left parietal white matter versus persistent restricted diffusion at the site of a chronic infarct is noted. PMH includes CVA, CAD s/p PCI, DM, HTN, OSA on CPAP, and prostate cancer.    OT comments  Patient continues to make progress towards goals in skilled OT session. Patient's session encompassed functional ADLs, and attempts to assess cognition in higher level tasks. Pt quite flat in session to date, often stating "yeah" to any question posed. Pt remained around 88% in session with O2 at 8L [place]. (Per nursing, doctor is agreeable to 8L [place] as 88/89 is now apparent baseline). Pt required reorientation to ADL tasks, often looking off in the distance, but could complete with cues only to reorient. Will continue to follow acutely to further assess progression in higher level ADL and IADL tasks and provide education to spouse upon discharge home if cognitive impairments continue.    Follow Up Recommendations  Home health OT;Supervision - Intermittent    Equipment Recommendations  None recommended by OT    Recommendations for Other Services      Precautions / Restrictions Precautions Precautions: Fall Precaution Comments: Desat Restrictions Weight Bearing Restrictions: No       Mobility Bed Mobility Overal bed mobility: Needs Assistance Bed Mobility: Supine to Sit;Sit to Supine     Supine to sit: Min assist;HOB elevated Sit to supine: Min guard;HOB elevated   General bed mobility comments: +rail, increased time. Cues for sequencing and need to take deep breaths for O2 saturations  Transfers Overall transfer level: Needs assistance Equipment used: Rolling walker (2 wheeled) Transfers: Sit to/from [facility] Sit to  Stand: Min guard;Min assist Stand pivot transfers: Min guard;Min assist       General transfer comment: cues for safety/sequencing min A for balance and desatting (minimally upon standing)    Balance Overall balance assessment: Needs assistance Sitting-balance support: No upper extremity supported;Feet supported Sitting balance-Leahy Scale: Good     Standing balance support: Bilateral upper extremity supported;During functional activity;Single extremity supported Standing balance-Leahy Scale: Poor Standing balance comment: reliant on UE support                           ADL either performed or assessed with clinical judgement   ADL Overall ADL's : Needs assistance/impaired     Grooming: Oral care;Wash/dry hands;Supervision/safety;Standing;Cueing for sequencing;Set up                   Toilet Transfer: Ambulation;Minimal assistance Toilet Transfer Details (indicate cue type and reason): Safety (simulated with chair transfer)         Functional mobility during ADLs: Minimal assistance General ADL Comments: Min A due to safety, desatting, balance, and cognition for higher level tasks     Vision Patient Visual Report: No change from baseline     Perception     Praxis      Cognition Arousal/Alertness: Awake/alert Behavior During Therapy: Flat affect;Impulsive Overall Cognitive Status: Impaired/Different from baseline Area of Impairment: Orientation;Attention;Memory;Safety/judgement;Problem solving;Awareness                 Orientation Level: Disoriented to;Situation Current Attention Level: Selective Memory: Decreased short-term memory;Decreased recall of precautions   Safety/Judgement: Decreased awareness of safety Awareness: Intellectual Problem  Solving: Slow processing;Difficulty sequencing;Requires verbal cues General Comments: Pt flat in session with therapist, often answering "yeah" to all questions posed even when not an appropriate  response, upon entering room, pt had his leg propped on his tray table, and could not explain why when asked. Pt would simply gesture to leg on tray table        Exercises     Shoulder Instructions       General Comments      Pertinent Vitals/ Pain       Pain Assessment: No/denies pain Faces Pain Scale: Hurts a little bit Pain Intervention(s): Monitored during session;Repositioned  Home Living                                          Prior Functioning/Environment              Frequency  Min 3X/week        Progress Toward Goals  OT Goals(current goals can now be found in the care plan section)  Progress towards OT goals: Progressing toward goals  Acute Rehab OT Goals Patient Stated Goal: home OT Goal Formulation: With patient Time For Goal Achievement: 09/23/19 Potential to Achieve Goals: Good  Plan Discharge plan remains appropriate    Co-evaluation                 AM-PAC OT "6 Clicks" Daily Activity     Outcome Measure   Help from another person eating meals?: None Help from another person taking care of personal grooming?: A Little Help from another person toileting, which includes using toliet, bedpan, or urinal?: A Little Help from another person bathing (including washing, rinsing, drying)?: A Lot Help from another person to put on and taking off regular upper body clothing?: A Lot Help from another person to put on and taking off regular lower body clothing?: A Lot 6 Click Score: 16    End of Session Equipment Utilized During Treatment: Gait belt;Oxygen  OT Visit Diagnosis: Unsteadiness on feet (R26.81);Muscle weakness (generalized) (M62.81);Other symptoms and signs involving cognitive function   Activity Tolerance Patient limited by fatigue   Patient Left in chair;with call bell/phone within reach;with chair alarm set   Nurse Communication Mobility status        Time: [id number] OT Time Calculation (min): 32  min  Charges: OT General Charges $OT Visit: 1 Visit OT Treatments $Self Care/Home Management : 23-37 mins  [name], COTA/L Acute Rehabilitation Services [place] 09/12/2019, 1:39 PM

## 2019-09-12 NOTE — Evaluation (Signed)
Clinical/Bedside Swallow Evaluation Patient Details  Name: Andrew Cobb MRN: [id number] Date of Birth: [birth year]  Today's Date: 09/12/2019 Time: SLP Start Time (ACUTE ONLY): 1142 SLP Stop Time (ACUTE ONLY): 1202 SLP Time Calculation (min) (ACUTE ONLY): 20 min  Past Medical History:  Past Medical History:  Diagnosis Date  . Arthritis   . Asthma   . Cancer [facility])    prostate  . Chronic venous insufficiency    varicose vein lower extremity with inflammation  . Coronary artery disease 1996   two stents placed   . Diabetes mellitus without complication ([place])    type 2 on metformin  . GERD (gastroesophageal reflux disease)    no issues since gastric bypass surgery as stated per pt  . Hyperlipidemia   . Hypogonadism in male   . MRSA (methicillin resistant Staphylococcus aureus) infection    07/30/2008 thru 08/07/2008  . Sleep apnea    on BIPAP  . Stented coronary artery   . Stroke ([place])   . Thrombocythemia [facility])    Past Surgical History:  Past Surgical History:  Procedure Laterality Date  . ANGIOPLASTY    . ANGIOPLASTY     with stent 04/07/1995  . ANTERIOR CERVICAL DECOMPRESSION/DISCECTOMY FUSION 4 LEVELS N/A 08/17/2017   Procedure: Anterior discectomy with fusion and plate fixation Cervical Three-Four, Four-Five, Five-Six, and Six-Seven Fusion;  Surgeon: Ditty, Kevan Ny, MD;  Location: [place];  Service: Neurosurgery;  Laterality: N/A;  Anterior discectomy with fusion and plate fixation Cervical Three-Four, Four-Five, Five-Six, and Six-Seven Fusion   . APPENDECTOMY     1966  . BUBBLE STUDY  09/20/2018   Procedure: BUBBLE STUDY;  Surgeon: Josue Hector, MD;  Location: [place];  Service: Cardiovascular;;  . [place]  . CARDIOVASCULAR STRESS TEST     07/31/2011  . CARPAL TUNNEL RELEASE Left   . CHOLECYSTECTOMY     2006  . colonscopy      08/25/2012  . EYE SURGERY Bilateral    cataract  . FUNCTIONAL ENDOSCOPIC SINUS SURGERY     11/10/2013  .  GASTRIC BYPASS     10/05/2012  . HERNIA REPAIR     left inguinal 1981  . INCISION AND DRAINAGE ABSCESS Right 02/26/2017   Procedure: INCISION AND DRAINAGE ABSCESS;  Surgeon: Nickie Retort, MD;  Location: [facility];  Service: Urology;  Laterality: Right;  . JOINT REPLACEMENT     bilateral  . left ankle surgery      05/03/2003   . left carpel tunnel      09/18/1993  . left knee meniscal tear      01/25/2010  . left knee meniscal tear repair      05/04/1996  . left rotator cuff repair      05/03/2003   . LOOP RECORDER INSERTION N/A 09/20/2018   Procedure: LOOP RECORDER INSERTION;  Surgeon: Evans Lance, MD;  Location: [place] CV [facility];  Service: Cardiovascular;  Laterality: N/A;  . REPLACEMENT TOTAL KNEE BILATERAL  07/13/2015  . right ankle surgery      fracture has 2 screws 07/07/1997  . right carpel tunnel      05/16/1992  . right shoulder replacement      01/27/2006  . SCROTAL EXPLORATION Right 02/26/2017   Procedure: SCROTUM EXPLORATION;  Surgeon: Nickie Retort, MD;  Location: [facility];  Service: Urology;  Laterality: Right;  . TEE WITHOUT CARDIOVERSION N/A 09/20/2018   Procedure: TRANSESOPHAGEAL ECHOCARDIOGRAM (TEE);  Surgeon: Josue Hector, MD;  Location[facility];  Service: Cardiovascular;  Laterality: N/A;  . TOTAL KNEE ARTHROPLASTY Left 07/13/2015   Procedure: LEFT TOTAL KNEE ARTHROPLASTY;  Surgeon: Gaynelle Arabian, MD;  Location: [facility];  Service: Orthopedics;  Laterality: Left;   HPI:  Pt is a 69 yo male admitted secondary to AMS and concerns of seizure. [name] Kitchen MRI revealed a questionable acute infarct in the left parietal white matter versus persistent restricted diffusion at the site of a chronic infarct is noted. PMH includes CVA, CAD s/p PCI, DM, HTN, OSA on CPAP, and prostate cancer. Chest CT widespread areas of mixed ground-glass and consolidative opacity. Findings could be seen in the setting of aspiration pneumonia/pneumonitis. Spoke with MD who is conerned with  a baseline dysphagia causing pna versus aspiration during/after seizure.    Assessment / Plan / Recommendation Clinical Impression  CT of chest performed 3/6 revealing worsening lunch status concerning for possible aspiration with additional differential diagnosis. Pt and wife state and agree since Saturday he has been "choking" a lot. Oral-motor ROM and status of cavity and mucousa was adequate and misisng only several teeth. Concerns for possible aspiration noted by 2 episodes of sudden and delayed coughing. Given actue stroke, chest CT results, clincal observation and subjective reports of coughing, recommend MBS which will be planned for tomorrow morning. Pt can continue regular texture and thin liquids, pills (one at a time) with water.      SLP Visit Diagnosis: Dysphagia, unspecified (R13.10)    Aspiration Risk  Mild aspiration risk;Moderate aspiration risk    Diet Recommendation Regular;Thin liquid   Liquid Administration via: Cup;Straw Medication Administration: Whole meds with liquid(one at at time, 2 if small ) Supervision: Patient able to self feed;Intermittent supervision to cue for compensatory strategies Compensations: Slow rate;Small sips/bites Postural Changes: Seated upright at 90 degrees    Other  Recommendations Oral Care Recommendations: Oral care BID   Follow up Recommendations (TBD)      Frequency and Duration min 2x/week  2 weeks       Prognosis Prognosis for Safe Diet Advancement: Good      Swallow Study   General HPI: Pt is a 69 yo male admitted secondary to AMS and concerns of seizure. [name] Kitchen MRI revealed a questionable acute infarct in the left parietal white matter versus persistent restricted diffusion at the site of a chronic infarct is noted. PMH includes CVA, CAD s/p PCI, DM, HTN, OSA on CPAP, and prostate cancer. Chest CT widespread areas of mixed ground-glass and consolidative opacity. Findings could be seen in the setting of aspiration  pneumonia/pneumonitis. Spoke with MD who is conerned with a baseline dysphagia causing pna versus aspiration during/after seizure.  Type of Study: Bedside Swallow Evaluation Previous Swallow Assessment: (none) Diet Prior to this Study: Regular;Thin liquids Temperature Spikes Noted: No Respiratory Status: Nasal cannula History of Recent Intubation: No Behavior/Cognition: Alert;Cooperative;Pleasant mood;Requires cueing Oral Cavity Assessment: Within Functional Limits Oral Care Completed by SLP: No Oral Cavity - Dentition: Adequate natural dentition(missing only several ) Vision: Functional for self-feeding Self-Feeding Abilities: Able to feed self Patient Positioning: Upright in chair Baseline Vocal Quality: Normal Volitional Cough: Strong Volitional Swallow: Able to elicit    Oral/Motor/Sensory Function Overall Oral Motor/Sensory Function: Within functional limits   Ice Chips Ice chips: Not tested   Thin Liquid Thin Liquid: Impaired Presentation: Straw;Cup Pharyngeal  Phase Impairments: Cough - Delayed    Nectar Thick Nectar Thick Liquid: Not tested   Honey Thick Honey Thick Liquid: Not tested   Puree Puree:  Within functional limits   Solid     Solid: Impaired Pharyngeal Phase Impairments: Cough - Delayed      [place] Siren 09/12/2019,1:02 PM  Orbie Pyo Colvin Caroli.Ed Risk analyst [phone] Office [phone]

## 2019-09-13 ENCOUNTER — Inpatient Hospital Stay (HOSPITAL_COMMUNITY): Payer: PPO

## 2019-09-13 LAB — CBC
HCT: 36.8 % — ABNORMAL LOW (ref 39.0–52.0)
Hemoglobin: 12 g/dL — ABNORMAL LOW (ref 13.0–17.0)
MCH: 30.1 pg (ref 26.0–34.0)
MCHC: 32.6 g/dL (ref 30.0–36.0)
MCV: 92.2 fL (ref 80.0–100.0)
Platelets: 236 10*3/uL (ref 150–400)
RBC: 3.99 MIL/uL — ABNORMAL LOW (ref 4.22–5.81)
RDW: 13 % (ref 11.5–15.5)
WBC: 4.8 10*3/uL (ref 4.0–10.5)
nRBC: 0 % (ref 0.0–0.2)

## 2019-09-13 LAB — BASIC METABOLIC PANEL
Anion gap: 10 (ref 5–15)
BUN: 18 mg/dL (ref 8–23)
CO2: 25 mmol/L (ref 22–32)
Calcium: 8.3 mg/dL — ABNORMAL LOW (ref 8.9–10.3)
Chloride: 100 mmol/L (ref 98–111)
Creatinine, Ser: 1.02 mg/dL (ref 0.61–1.24)
GFR calc Af Amer: >60 mL/min (ref >60)
GFR calc non Af Amer: >60 mL/min (ref >60)
Glucose, Bld: 206 mg/dL — ABNORMAL HIGH (ref 70–99)
Potassium: 3.7 mmol/L (ref 3.5–5.1)
Sodium: 135 mmol/L (ref 135–145)

## 2019-09-13 LAB — GLUCOSE, CAPILLARY
Glucose-Capillary: 200 mg/dL — ABNORMAL HIGH (ref 70–99)
Glucose-Capillary: 202 mg/dL — ABNORMAL HIGH (ref 70–99)
Glucose-Capillary: 233 mg/dL — ABNORMAL HIGH (ref 70–99)
Glucose-Capillary: 162 mg/dL — ABNORMAL HIGH (ref 70–99)
Glucose-Capillary: 208 mg/dL — ABNORMAL HIGH (ref 70–99)
Glucose-Capillary: 159 mg/dL — ABNORMAL HIGH (ref 70–99)

## 2019-09-13 LAB — BRAIN NATRIURETIC PEPTIDE: B Natriuretic Peptide: 23.5 pg/mL (ref 0.0–100.0)

## 2019-09-13 LAB — PROCALCITONIN: Procalcitonin: <0.1 ng/mL

## 2019-09-13 LAB — MAGNESIUM: Magnesium: 2.1 mg/dL (ref 1.7–2.4)

## 2019-09-13 NOTE — Progress Notes (Signed)
Baltazar Najjar NP, notified.

## 2019-09-13 NOTE — Progress Notes (Signed)
  Speech Language Pathology  Patient Details Name: Andrew Cobb MRN: [id number] DOB: 30-Jul-1950 Today's Date: 09/13/2019 Time:  -     MBS scheduled this afternoon at [place], Andrew Cobb 09/13/2019, 8:24 AM   Andrew Cobb.Ed Risk analyst [phone] Office [phone]

## 2019-09-13 NOTE — Progress Notes (Signed)
  Speech Language Pathology  Patient Details Name: Andrew Cobb MRN: [id number] DOB: [birth year] Today's Date: 09/13/2019 Time: [id number] SLP Time Calculation (min) (ACUTE ONLY): 14 min        MBS completed- full report to be in chart once fully documented. Recommend regular texture, thin liquids, NO straws, pills in puree.               [place], [name] 09/13/2019, 3:15 PM

## 2019-09-13 NOTE — Progress Notes (Signed)
PPV in use

## 2019-09-13 NOTE — Progress Notes (Signed)
Inpatient Diabetes Program Recommendations  AACE/ADA: New Consensus Statement on Inpatient Glycemic Control (2015)  Target Ranges:  Prepandial:   less than 140 mg/dL      Peak postprandial:   less than 180 mg/dL (1-2 hours)      Critically ill patients:  140 - 180 mg/dL   Lab Results  Component Value Date   GLUCAP 202 (H) 09/13/2019   HGBA1C 9.2 (H) 09/08/2019    Review of Glycemic Control Results for Andrew Cobb, Andrew Cobb (MRN [id number]) as of 09/13/2019 08:55  Ref. Range 09/12/2019 08:34 09/12/2019 11:17 09/12/2019 15:35 09/12/2019 20:01 09/12/2019 22:30 09/12/2019 22:56 09/13/2019 03:24 09/13/2019 07:34  Glucose-Capillary Latest Ref Range: 70 - 99 mg/dL 169 (H) 229 (H) 145 (H) 220 (H) 194 (H) 221 (H) 200 (H) 202 (H)   Diabetes history: DM 2 Outpatient Diabetes medications: Jardiance 10 mg Daily, Metformin 500 mg bid Current orders for Inpatient glycemic control:   Novolog 0-15 units tid + hs Novolog 2 units tid meal coverage  Inpatient Diabetes Program Recommendations:    Consider Lantus 10 units starting tonight.  Thanks,  Tama Headings RN, MSN, BC-ADM Inpatient Diabetes Coordinator Team Pager [phone] (8a-5p)

## 2019-09-13 NOTE — Progress Notes (Signed)
Occupational Therapy Treatment Patient Details Name: Andrew Cobb MRN: [id number] DOB: [birth year] Today's Date: 09/13/2019    History of present illness Pt is a 69 y/o male admitted secondary to AMS. MRI revealed a questionable acute infarct in the left parietal white matter versus persistent restricted diffusion at the site of a chronic infarct is noted. PMH includes CVA, CAD s/p PCI, DM, HTN, OSA on CPAP, and prostate cancer.    OT comments  Pt progressing with OOB ADL and following commands well. Pt sequenced through tasks with minimal cues for proper technique. Pt minguardA overall for standing for ADL at commode and at sink. Pt with limited vocalizations; pt stating "I'm trying to find santa clause" as he was playing on his phone (unrelated to therapy)  prior to session ending. Pt using BUEs for [facility] tasks with no difficulty; pt's food came and he was distracted to eat soon so fine motor coordination sheet given, but pt inattentive to task. Pt would benefit from continued OT skilled services for ADL, mobility and safety in [place] setting. OT following acutely.    Follow Up Recommendations  Home health OT;Supervision - Intermittent    Equipment Recommendations  None recommended by OT    Recommendations for Other Services      Precautions / Restrictions Precautions Precautions: Fall;Other (comment) Precaution Comments: Desat Restrictions Weight Bearing Restrictions: No       Mobility Bed Mobility Overal bed mobility: Needs Assistance Bed Mobility: Supine to Sit       Sit to supine: Supervision   General bed mobility comments: rail use  Transfers Overall transfer level: Needs assistance Equipment used: Rolling walker (2 wheeled) Transfers: Sit to/from Stand Sit to Stand: Min guard         General transfer comment: cues for hand placement    Balance Overall balance assessment: Needs assistance Sitting-balance support: No upper extremity supported;Feet  supported Sitting balance-Leahy Scale: Good     Standing balance support: Bilateral upper extremity supported;Single extremity supported;During functional activity Standing balance-Leahy Scale: Fair Standing balance comment: reliant on single UE support                           ADL either performed or assessed with clinical judgement   ADL Overall ADL's : Needs assistance/impaired     Grooming: Supervision/safety;Standing Grooming Details (indicate cue type and reason): Stood for self care at sink; no LOB episodes                 Toilet Transfer: Min guard;Ambulation;RW   Toileting- Clothing Manipulation and Hygiene: Supervision/safety;Sit to/from stand Toileting - Clothing Manipulation Details (indicate cue type and reason): Pt missing commode for urination for 2-3 seconds. Pt unaware until OTR directing pt.     Functional mobility during ADLs: Min guard;Rolling walker;Cueing for safety General ADL Comments: Pt minguardA overall for standing for ADL; pt sequencing well today. Pt following all commands.     Vision       Perception     Praxis      Cognition Arousal/Alertness: Awake/alert Behavior During Therapy: Flat affect Overall Cognitive Status: Impaired/Different from baseline Area of Impairment: Orientation;Attention;Memory;Safety/judgement;Problem solving;Awareness                 Orientation Level: Disoriented to;Situation;Time(Pt saying "I've had a stroke." Pt stating "Sept" for month) Current Attention Level: Selective Memory: Decreased short-term memory;Decreased recall of precautions   Safety/Judgement: Decreased awareness of safety Awareness: Intellectual Problem Solving: Slow processing;Difficulty  sequencing;Requires verbal cues General Comments: Pt with limited vocalizations; pt stating "I'm trying to find santa clause" as he was playing on his phone (unrelated to therapy)  prior to session ending.        Exercises Exercises:  Other exercises Other Exercises Other Exercises: Introduction of fine motor coordination exercises   Shoulder Instructions       General Comments Pt on 8-10L O2; 9L O2 at rest 93%; 10 L O2 with activity 92% through [employer].    Pertinent Vitals/ Pain       Pain Assessment: No/denies pain  Home Living                                          Prior Functioning/Environment              Frequency  Min 3X/week        Progress Toward Goals  OT Goals(current goals can now be found in the care plan section)  Progress towards OT goals: Progressing toward goals  Acute Rehab OT Goals Patient Stated Goal: home OT Goal Formulation: With patient Time For Goal Achievement: 09/23/19 Potential to Achieve Goals: Good ADL Goals Pt Will Perform Grooming: Independently;standing Pt Will Perform Upper Body Bathing: Independently;standing Pt Will Transfer to Toilet: with modified independence;ambulating Pt/caregiver will Perform Home Exercise Program: Increased strength;Both right and left upper extremity;Independently Additional ADL Goal #1: Patient will increase fine motor coordination and strength to independently maintain grip on tools.  Plan Discharge plan remains appropriate    Co-evaluation                 AM-PAC OT "6 Clicks" Daily Activity     Outcome Measure   Help from another person eating meals?: None Help from another person taking care of personal grooming?: A Little Help from another person toileting, which includes using toliet, bedpan, or urinal?: A Little Help from another person bathing (including washing, rinsing, drying)?: A Lot Help from another person to put on and taking off regular upper body clothing?: A Little Help from another person to put on and taking off regular lower body clothing?: A Lot 6 Click Score: 17    End of Session Equipment Utilized During Treatment: Gait belt;Rolling walker;Oxygen  OT Visit Diagnosis: Unsteadiness  on feet (R26.81);Muscle weakness (generalized) (M62.81);Other symptoms and signs involving cognitive function   Activity Tolerance Patient limited by fatigue   Patient Left in chair;with call bell/phone within reach;with chair alarm set   Nurse Communication Mobility status        Time: 1530-1605 OT Time Calculation (min): 35 min  Charges: OT General Charges $OT Visit: 1 Visit OT Treatments $Self Care/Home Management : 23-37 mins  Jefferey Pica, OTR/L Acute Rehabilitation Services Pager: [phone] Office: [phone]    Rajveer Handler C 09/13/2019, 4:24 PM

## 2019-09-13 NOTE — Progress Notes (Signed)
PROGRESS NOTE    Andrew Cobb  [id number] DOB: 1950-08-11 DOA: 09/07/2019 PCP: Guadalupe Maple, MD   Brief Narrative:  69 year old with history of CVA with residual speech deficit, hemorrhagic conversion from ischemic stroke, CAD status post PCI, DM 2, HTN, HLD, OSA on CPAP initially came to ER with change in mental status.  MRI showed acute CVA along with chronic changes, neurology team was consulted.  There was also concerns of seizure therefore started on Keppra.  CT of the chest without contrast showed groundglass opacity concerns for pneumonitis/pneumonia.   Assessment & Plan:   Principal Problem:   Altered mental status Active Problems:   OSA (obstructive sleep apnea)   Hypertension associated with diabetes ([place])   Uncontrolled type 2 diabetes mellitus with hyperglycemia, without long-term current use of insulin (HCC)   Coronary artery disease   Hyperlipidemia associated with type 2 diabetes mellitus (HCC)   History of CVA (cerebrovascular accident)   Acute encephalopathy   Bilateral pneumonia  Acute encephalopathy, persist Acute seizures, Acute CVA 65mm left parietal white matter per MRI but neurology thinks is chronic.  History of recurrent embolic infarcts/CVA with hemorrhagic conversion in July 2020 -Status post loading dose of Keppra, Keppra 500 mg twice daily -EEG-negative for seizures but shows encephalopathy.  Continue seizure precaution -Per neurology team patient has chronic CVA. -Aspirin 81 mg, atorvastatin 40 mg daily -CT head-negative -UA-shows glucosuria but no evidence of infection  Bilateral multifocal infiltrate Aspiration pneumonitis/pneumonia in the setting of seizure -Worsening, on 9 L of high flow nasal cannula. -COVID-19-negative  -Procalcitonin-negative, BNP Normal.  Discontinue Zosyn after today -Respiratory panel-negative -Echocardiogram March 2020-EF 60 to 65%, mild LVH -I-S//flutter.  Scheduled and as needed bronchodilators -CT chest  consistent with bilateral infiltrates-aspiration? - aspiration precautions. -Speech team following, plans for MBS today afternoon -Made him n.p.o. for now  Acute kidney injury, prerenal -Appears to have resolved.  Creatinine at baseline around 1.1  Coronary artery disease status post PCI -Currently chest pain-free.  Continue aspirin and statin  Essential hypertension -Benzapril on hold due to AKI  D M2, poorly controlled due to hyperglycemia -Home p.o. meds on hold, A1c 9.2 -Sliding scale Accu-Cheks  Hyperlipidemia -Statin  OSA -CPAP  PT-Home health.   DVT prophylaxis: Lovenox Code Status: DNR Family Communication: None Disposition Plan:   Patient From= Home  Patient Anticipated D/C place= Home with home health  Barriers= needs MBS to evaluate for aspiration.  Increasing respiratory oxygen requirement, now on 9 L high flow.  Unsafe for discharge at  Subjective: Sitting at the side of the bed attempting to eat her breakfast but at this time asked him to stop because of risk of aspiration-likely worsening  Review of Systems Otherwise negative except as per HPI, including: General = no fevers, chills, dizziness,  fatigue HEENT/EYES = negative for loss of vision, double vision, blurred vision,  sore throa Cardiovascular= negative for chest pain, palpitation Respiratory/lungs= negative for shortness of breath, cough, wheezing; hemoptysis,  Gastrointestinal= negative for nausea, vomiting, abdominal pain Genitourinary= negative for Dysuria MSK = Negative for arthralgia, myalgias Neurology= Negative for headache, numbness, tingling  Psychiatry= Negative for suicidal and homocidal ideation Skin= Negative for Rash   Examination: Constitutional: Not in acute distress, 9 L high flow nasal cannula Respiratory: Bilateral diffuse rhonchi Cardiovascular: Normal sinus rhythm, no rubs Abdomen: Nontender nondistended good bowel sounds Musculoskeletal: No edema noted Skin: No  rashes seen Neurologic: CN 2-12 grossly intact.  And nonfocal Psychiatric: Alert to name.  Poor judgment and  insight   Objective: Vitals:   09/13/19 0640 09/13/19 0650 09/13/19 0743 09/13/19 0919  BP: 120/71  128/70   Pulse: 82     Resp: 20  20   Temp: 98.7 F (37.1 C)  97.8 F (36.6 C)   TempSrc: Oral  Axillary   SpO2: (!) 88% 93% (!) 88% 92%  Weight:      Height:        Intake/Output Summary (Last 24 hours) at 09/13/2019 1337 Last data filed at 09/13/2019 0800 Gross per 24 hour  Intake --  Output 200 ml  Net -200 ml   Filed Weights   09/08/19 0816  Weight: 90.7 kg     Data Reviewed:   CBC: Recent Labs  Lab 09/07/19 1121 09/07/19 1145 09/09/19 0205 09/10/19 0231 09/11/19 0144 09/12/19 0510 09/13/19 0052  WBC 8.2   < > 6.7 5.6 5.6 5.1 4.8  NEUTROABS 7.0  --   --   --   --   --   --   HGB 13.8   < > 13.2 12.4* 12.7* 12.0* 12.0*  HCT 44.1   < > 40.2 37.7* 38.8* 36.5* 36.8*  MCV 97.8   < > 92.0 92.4 92.4 91.7 92.2  PLT 142*   < > 173 191 205 212 236   < > = values in this interval not displayed.   Basic Metabolic Panel: Recent Labs  Lab 09/09/19 0205 09/10/19 0231 09/11/19 0144 09/12/19 0408 09/13/19 0052  NA 135 134* 136 135 135  K 4.5 4.1 3.9 3.7 3.7  CL 102 102 102 99 100  CO2 17* 20* 24 24 25   GLUCOSE 155* 150* 167* 193* 206*  BUN 26* 26* 20 20 18   CREATININE 1.16 1.22 1.12 1.11 1.02  CALCIUM 8.0* 8.0* 8.1* 8.0* 8.3*  MG  --  2.2 2.1 2.0 2.1   GFR: Estimated Creatinine Clearance: 78.5 mL/min (by C-G formula based on SCr of 1.02 mg/dL). Liver Function Tests: Recent Labs  Lab 09/08/19 1528 09/09/19 0205 09/10/19 0231 09/11/19 0144 09/12/19 0408  AST 50* 52* 54* 49* 55*  ALT 36 36 38 36 41  ALKPHOS 51 51 51 50 51  BILITOT 1.7* 1.6* 2.1* 2.3* 1.8*  PROT 5.7* 5.9* 5.5* 5.6* 5.6*  ALBUMIN 2.8* 2.9* 2.6* 2.5* 2.2*   No results for input(s): LIPASE, AMYLASE in the last 168 hours. No results for input(s): AMMONIA in the last 168  hours. Coagulation Profile: Recent Labs  Lab 09/07/19 1121  INR 1.0   Cardiac Enzymes: No results for input(s): CKTOTAL, CKMB, CKMBINDEX, TROPONINI in the last 168 hours. BNP (last 3 results) No results for input(s): PROBNP in the last 8760 hours. HbA1C: No results for input(s): HGBA1C in the last 72 hours. CBG: Recent Labs  Lab 09/12/19 2230 09/12/19 2256 09/13/19 0324 09/13/19 0734 09/13/19 1155  GLUCAP 194* 221* 200* 202* 233*   Lipid Profile: No results for input(s): CHOL, HDL, LDLCALC, TRIG, CHOLHDL, LDLDIRECT in the last 72 hours. Thyroid Function Tests: No results for input(s): TSH, T4TOTAL, FREET4, T3FREE, THYROIDAB in the last 72 hours. Anemia Panel: No results for input(s): VITAMINB12, FOLATE, FERRITIN, TIBC, IRON, RETICCTPCT in the last 72 hours. Sepsis Labs: Recent Labs  Lab 09/08/19 1528 09/13/19 0052  PROCALCITON <0.10 <0.10    Recent Results (from the past 240 hour(s))  Respiratory Panel by PCR     Status: None   Collection Time: 09/08/19  2:57 PM   Specimen: Nasopharyngeal Swab; Respiratory  Result Value Ref Range  Status   Adenovirus NOT DETECTED NOT DETECTED Final   Coronavirus 229E NOT DETECTED NOT DETECTED Final    Comment: (NOTE) The Coronavirus on the Respiratory Panel, DOES NOT test for the novel  Coronavirus (2019 nCoV)    Coronavirus HKU1 NOT DETECTED NOT DETECTED Final   Coronavirus NL63 NOT DETECTED NOT DETECTED Final   Coronavirus OC43 NOT DETECTED NOT DETECTED Final   Metapneumovirus NOT DETECTED NOT DETECTED Final   Rhinovirus / Enterovirus NOT DETECTED NOT DETECTED Final   Influenza A NOT DETECTED NOT DETECTED Final   Influenza B NOT DETECTED NOT DETECTED Final   Parainfluenza Virus 1 NOT DETECTED NOT DETECTED Final   Parainfluenza Virus 2 NOT DETECTED NOT DETECTED Final   Parainfluenza Virus 3 NOT DETECTED NOT DETECTED Final   Parainfluenza Virus 4 NOT DETECTED NOT DETECTED Final   Respiratory Syncytial Virus NOT DETECTED NOT  DETECTED Final   Bordetella pertussis NOT DETECTED NOT DETECTED Final   Chlamydophila pneumoniae NOT DETECTED NOT DETECTED Final   Mycoplasma pneumoniae NOT DETECTED NOT DETECTED Final    Comment: Performed at [facility], [place] [address] [place], [place] [address]  MRSA PCR Screening     Status: Abnormal   Collection Time: 09/08/19 11:24 PM   Specimen: Nasopharyngeal  Result Value Ref Range Status   MRSA by PCR POSITIVE (A) NEGATIVE Final    Comment:        The GeneXpert MRSA Assay (FDA approved for NASAL specimens only), is one component of a comprehensive MRSA colonization surveillance program. It is not intended to diagnose MRSA infection nor to guide or monitor treatment for MRSA infections. RESULT CALLED TO, READ BACK BY AND VERIFIED WITH: A ROSS RN 09/09/19 0059 JDW Performed at [facility], [place] [address], [place], [place] [address]          Radiology Studies: No results found.      Scheduled Meds: .  stroke: mapping our early stages of recovery book   Does not apply Once  . aspirin EC  81 mg Oral Daily  . atorvastatin  40 mg Oral QHS  . budesonide (PULMICORT) nebulizer solution  0.5 mg Nebulization BID  . Chlorhexidine Gluconate Cloth  6 each Topical Q0600  . enoxaparin (LOVENOX) injection  40 mg Subcutaneous Q24H  . insulin aspart  0-15 Units Subcutaneous TID WC  . insulin aspart  0-5 Units Subcutaneous QHS  . insulin aspart  2 Units Subcutaneous TID WC  . ipratropium-albuterol  3 mL Nebulization TID  . levETIRAcetam  500 mg Oral BID  . mouth rinse  15 mL Mouth Rinse BID  . mupirocin ointment  1 application Nasal BID   Continuous Infusions:    LOS: 5 days   Time spent= 25 mins    Adileny Delon Arsenio Loader, MD [facility]  If 7PM-7AM, please contact night-coverage  09/13/2019, 1:37 PM

## 2019-09-14 ENCOUNTER — Other Ambulatory Visit: Payer: Self-pay | Admitting: Pharmacy Technician

## 2019-09-14 LAB — CBC
HCT: 38.9 % — ABNORMAL LOW (ref 39.0–52.0)
Hemoglobin: 12.7 g/dL — ABNORMAL LOW (ref 13.0–17.0)
MCH: 30.5 pg (ref 26.0–34.0)
MCHC: 32.6 g/dL (ref 30.0–36.0)
MCV: 93.5 fL (ref 80.0–100.0)
Platelets: 250 10*3/uL (ref 150–400)
RBC: 4.16 MIL/uL — ABNORMAL LOW (ref 4.22–5.81)
RDW: 12.9 % (ref 11.5–15.5)
WBC: 5.2 10*3/uL (ref 4.0–10.5)
nRBC: 0 % (ref 0.0–0.2)

## 2019-09-14 LAB — GLUCOSE, CAPILLARY
Glucose-Capillary: 159 mg/dL — ABNORMAL HIGH (ref 70–99)
Glucose-Capillary: 143 mg/dL — ABNORMAL HIGH (ref 70–99)
Glucose-Capillary: 173 mg/dL — ABNORMAL HIGH (ref 70–99)
Glucose-Capillary: 251 mg/dL — ABNORMAL HIGH (ref 70–99)
Glucose-Capillary: 185 mg/dL — ABNORMAL HIGH (ref 70–99)
Glucose-Capillary: 152 mg/dL — ABNORMAL HIGH (ref 70–99)
Glucose-Capillary: 187 mg/dL — ABNORMAL HIGH (ref 70–99)

## 2019-09-14 LAB — BASIC METABOLIC PANEL
Anion gap: 11 (ref 5–15)
BUN: 22 mg/dL (ref 8–23)
CO2: 26 mmol/L (ref 22–32)
Calcium: 8.6 mg/dL — ABNORMAL LOW (ref 8.9–10.3)
Chloride: 98 mmol/L (ref 98–111)
Creatinine, Ser: 1.06 mg/dL (ref 0.61–1.24)
GFR calc Af Amer: >60 mL/min (ref >60)
GFR calc non Af Amer: >60 mL/min (ref >60)
Glucose, Bld: 168 mg/dL — ABNORMAL HIGH (ref 70–99)
Potassium: 3.7 mmol/L (ref 3.5–5.1)
Sodium: 135 mmol/L (ref 135–145)

## 2019-09-14 LAB — MAGNESIUM: Magnesium: 2.2 mg/dL (ref 1.7–2.4)

## 2019-09-14 NOTE — Progress Notes (Signed)
  Speech Language Pathology Treatment: Dysphagia  Patient Details Name: Andrew Cobb MRN: [id number] DOB: June 19, 1951 Today's Date: 09/14/2019 Time: 9037-9558 SLP Time Calculation (min) (ACUTE ONLY): 19 min  Assessment / Plan / Recommendation Clinical Impression  Pt's cognition is clearer today during dysphagia session. Results and recommendations of MBS reviewed with him and his wife focusing on strategies to mitigate any risk of airway compromise. Pt independently recalled recommendation for no straws. Small sips and regular texture consumed without s/s aspiration. Reviewed esophageal precautions as small amount residue observed after MBS yesterday and may be contributing to frequent throat clears/coughs. Continue regular/thin, no straws pills in puree and ST will sign off at this time.    HPI HPI: Pt is a 69 yo male admitted secondary to AMS and concerns of seizure. [name] MRI revealed a questionable acute infarct in the left parietal white matter versus persistent restricted diffusion at the site of a chronic infarct is noted. PMH includes CVA, CAD s/p PCI, DM, HTN, OSA on CPAP, and prostate cancer. Chest CT widespread areas of mixed ground-glass and consolidative opacity. Findings could be seen in the setting of aspiration pneumonia/pneumonitis. Spoke with MD who is conerned with a baseline dysphagia causing pna versus aspiration during/after seizure.       SLP Plan  All goals met;Discharge SLP treatment due to (comment)       Recommendations  Diet recommendations: Regular;Thin liquid Liquids provided via: Cup;No straw Medication Administration: Whole meds with puree Supervision: Patient able to self feed;Intermittent supervision to cue for compensatory strategies Compensations: Slow rate;Small sips/bites Postural Changes and/or Swallow Maneuvers: Seated upright 90 degrees;Upright 30-60 min after meal                Oral Care Recommendations: Oral care BID Follow up  Recommendations: None SLP Visit Diagnosis: Dysphagia, oropharyngeal phase (R13.12) Plan: All goals met;Discharge SLP treatment due to (comment)                     Houston Siren 09/14/2019, 4:51 PM   Orbie Pyo Colvin Caroli.Ed Risk analyst [phone] Office [phone]

## 2019-09-14 NOTE — Progress Notes (Signed)
PROGRESS NOTE    Andrew Cobb  [id number] DOB: 1950-07-09 DOA: 09/07/2019 PCP: Guadalupe Maple, MD   Brief Narrative:  69 year old with history of CVA with residual speech deficit, hemorrhagic conversion from ischemic stroke, CAD status post PCI, DM 2, HTN, HLD, OSA on CPAP initially came to ER with change in mental status.  MRI showed acute CVA along with chronic changes, neurology team was consulted.  There was also concerns of seizure therefore started on Keppra.  CT of the chest without contrast showed groundglass opacity concerns for pneumonitis/pneumonia.   Assessment & Plan:   Principal Problem:   Altered mental status Active Problems:   OSA (obstructive sleep apnea)   Hypertension associated with diabetes ([place])   Uncontrolled type 2 diabetes mellitus with hyperglycemia, without long-term current use of insulin (HCC)   Coronary artery disease   Hyperlipidemia associated with type 2 diabetes mellitus (HCC)   History of CVA (cerebrovascular accident)   Acute encephalopathy   Bilateral pneumonia  Acute encephalopathy, persist Acute seizures, Acute CVA 101mm left parietal white matter per MRI but neurology thinks is chronic.  History of recurrent embolic infarcts/CVA with hemorrhagic conversion in July 2020 -Status post loading dose of Keppra, Keppra 500 mg twice daily -EEG-negative for seizures but shows encephalopathy.  Continue seizure precaution -Per neurology team patient has chronic CVA. -Aspirin 81 mg, atorvastatin 40 mg daily -CT head-negative -UA-shows glucosuria but no evidence of infection  Bilateral multifocal infiltrate Aspiration pneumonitis/pneumonia in the setting of seizure -Still gets very hypoxic with minimal ambulation down to 78% on 4 L requiring 10 L of nasal cannula to help his recovery. -COVID-19-negative  -Procalcitonin-negative, BNP Normal.  Discontinue Zosyn after today -Respiratory panel-negative -Echocardiogram March 2020-EF 60 to 65%,  mild LVH -I-S//flutter.  Scheduled and as needed bronchodilators -CT chest consistent with bilateral infiltrates-aspiration? - aspiration precautions. -Seen by speech team status post MBS-recommending regular diet  Acute kidney injury, prerenal -Appears to have resolved.  Creatinine at baseline around 1.1  Coronary artery disease status post PCI -Currently chest pain-free.  Continue aspirin and statin  Essential hypertension -Benzapril on hold due to AKI  D M2, poorly controlled due to hyperglycemia -Home p.o. meds on hold, A1c 9.2 -Sliding scale Accu-Cheks  Hyperlipidemia -Statin  OSA -CPAP  PT-Home health.   DVT prophylaxis: Lovenox Code Status: DNR Family Communication: Wife updated Disposition Plan:   Patient From= Home  Patient Anticipated D/C place= Home with home health  Barriers= still hypoxic requiring indefinite 4-10 L nasal cannula.  Once breathing is stabilized, will transition him home with home health.  Subjective: Sitting up in the bed, no complaints at rest.  On 4 L nasal cannula but when attempted to walk him he became hypoxic down to 78% requiring 10 L nasal cannula to recover  Review of Systems Otherwise negative except as per HPI, including: General = no fevers, chills, dizziness,  fatigue HEENT/EYES = negative for loss of vision, double vision, blurred vision,  sore throa Cardiovascular= negative for chest pain, palpitation Respiratory/lungs= negative for wheezing; hemoptysis,  Gastrointestinal= negative for nausea, vomiting, abdominal pain Genitourinary= negative for Dysuria MSK = Negative for arthralgia, myalgias Neurology= Negative for headache, numbness, tingling  Psychiatry= Negative for suicidal and homocidal ideation Skin= Negative for Rash   Examination: Constitutional: Not in acute distress, 10 L high flow nasal cannula Respiratory: Bilateral rhonchi Cardiovascular: Normal sinus rhythm, no rubs Abdomen: Nontender nondistended good  bowel sounds Musculoskeletal: No edema noted Skin: No rashes seen Neurologic: CN 2-12 grossly  intact.  And nonfocal Psychiatric: Normal judgment and insight. Alert and oriented x 3. Normal mood.     Objective: Vitals:   09/13/19 2024 09/13/19 2324 09/14/19 0800 09/14/19 0827  BP:  101/70  (!) 106/52  Pulse:  73  66  Resp:  20    Temp:  98.5 F (36.9 C)  97.7 F (36.5 C)  TempSrc:      SpO2: 91% 90% 92% 95%  Weight:      Height:        Intake/Output Summary (Last 24 hours) at 09/14/2019 1143 Last data filed at 09/14/2019 0330 Gross per 24 hour  Intake --  Output 475 ml  Net -475 ml   Filed Weights   09/08/19 0816  Weight: 90.7 kg     Data Reviewed:   CBC: Recent Labs  Lab 09/10/19 0231 09/11/19 0144 09/12/19 0510 09/13/19 0052 09/14/19 0219  WBC 5.6 5.6 5.1 4.8 5.2  HGB 12.4* 12.7* 12.0* 12.0* 12.7*  HCT 37.7* 38.8* 36.5* 36.8* 38.9*  MCV 92.4 92.4 91.7 92.2 93.5  PLT 191 205 212 236 [id number]   Basic Metabolic Panel: Recent Labs  Lab 09/10/19 0231 09/11/19 0144 09/12/19 0408 09/13/19 0052 09/14/19 0219  NA 134* 136 135 135 135  K 4.1 3.9 3.7 3.7 3.7  CL 102 102 99 100 98  CO2 20* 24 24 25 26   GLUCOSE 150* 167* 193* 206* 168*  BUN 26* 20 20 18 22   CREATININE 1.22 1.12 1.11 1.02 1.06  CALCIUM 8.0* 8.1* 8.0* 8.3* 8.6*  MG 2.2 2.1 2.0 2.1 2.2   GFR: Estimated Creatinine Clearance: 75.6 mL/min (by C-G formula based on SCr of 1.06 mg/dL). Liver Function Tests: Recent Labs  Lab 09/08/19 1528 09/09/19 0205 09/10/19 0231 09/11/19 0144 09/12/19 0408  AST 50* 52* 54* 49* 55*  ALT 36 36 38 36 41  ALKPHOS 51 51 51 50 51  BILITOT 1.7* 1.6* 2.1* 2.3* 1.8*  PROT 5.7* 5.9* 5.5* 5.6* 5.6*  ALBUMIN 2.8* 2.9* 2.6* 2.5* 2.2*   No results for input(s): LIPASE, AMYLASE in the last 168 hours. No results for input(s): AMMONIA in the last 168 hours. Coagulation Profile: No results for input(s): INR, PROTIME in the last 168 hours. Cardiac Enzymes: No results  for input(s): CKTOTAL, CKMB, CKMBINDEX, TROPONINI in the last 168 hours. BNP (last 3 results) No results for input(s): PROBNP in the last 8760 hours. HbA1C: No results for input(s): HGBA1C in the last 72 hours. CBG: Recent Labs  Lab 09/13/19 2323 09/14/19 0354 09/14/19 0629 09/14/19 0831 09/14/19 1135  GLUCAP 159* 159* 143* 173* 251*   Lipid Profile: No results for input(s): CHOL, HDL, LDLCALC, TRIG, CHOLHDL, LDLDIRECT in the last 72 hours. Thyroid Function Tests: No results for input(s): TSH, T4TOTAL, FREET4, T3FREE, THYROIDAB in the last 72 hours. Anemia Panel: No results for input(s): VITAMINB12, FOLATE, FERRITIN, TIBC, IRON, RETICCTPCT in the last 72 hours. Sepsis Labs: Recent Labs  Lab 09/08/19 1528 09/13/19 0052  PROCALCITON <0.10 <0.10    Recent Results (from the past 240 hour(s))  Respiratory Panel by PCR     Status: None   Collection Time: 09/08/19  2:57 PM   Specimen: Nasopharyngeal Swab; Respiratory  Result Value Ref Range Status   Adenovirus NOT DETECTED NOT DETECTED Final   Coronavirus 229E NOT DETECTED NOT DETECTED Final    Comment: (NOTE) The Coronavirus on the Respiratory Panel, DOES NOT test for the novel  Coronavirus (2019 nCoV)    Coronavirus HKU1 NOT DETECTED NOT  DETECTED Final   Coronavirus NL63 NOT DETECTED NOT DETECTED Final   Coronavirus OC43 NOT DETECTED NOT DETECTED Final   Metapneumovirus NOT DETECTED NOT DETECTED Final   Rhinovirus / Enterovirus NOT DETECTED NOT DETECTED Final   Influenza A NOT DETECTED NOT DETECTED Final   Influenza B NOT DETECTED NOT DETECTED Final   Parainfluenza Virus 1 NOT DETECTED NOT DETECTED Final   Parainfluenza Virus 2 NOT DETECTED NOT DETECTED Final   Parainfluenza Virus 3 NOT DETECTED NOT DETECTED Final   Parainfluenza Virus 4 NOT DETECTED NOT DETECTED Final   Respiratory Syncytial Virus NOT DETECTED NOT DETECTED Final   Bordetella pertussis NOT DETECTED NOT DETECTED Final   Chlamydophila pneumoniae NOT  DETECTED NOT DETECTED Final   Mycoplasma pneumoniae NOT DETECTED NOT DETECTED Final    Comment: Performed at [facility], [place] [address] [place], [place] [address]  MRSA PCR Screening     Status: Abnormal   Collection Time: 09/08/19 11:24 PM   Specimen: Nasopharyngeal  Result Value Ref Range Status   MRSA by PCR POSITIVE (A) NEGATIVE Final    Comment:        The GeneXpert MRSA Assay (FDA approved for NASAL specimens only), is one component of a comprehensive MRSA colonization surveillance program. It is not intended to diagnose MRSA infection nor to guide or monitor treatment for MRSA infections. RESULT CALLED TO, READ BACK BY AND VERIFIED WITH: A ROSS RN 09/09/19 0059 JDW Performed at [facility], [place] [address] [place], [place] [address]          Radiology Studies: DG Swallowing Func-Speech Pathology  Result Date: 09/14/2019 Objective Swallowing Evaluation: Type of Study: MBS-Modified Barium Swallow Study  Patient Details Name: Andrew Cobb MRN: [id number] Date of Birth: 1951-03-18 Today's Date: 09/14/2019 Time: SLP Start Time (ACUTE ONLY): 1310 -SLP Stop Time (ACUTE ONLY): 1324 SLP Time Calculation (min) (ACUTE ONLY): 14 min Past Medical History: Past Medical History: Diagnosis Date . Arthritis  . Asthma  . Cancer [facility])   prostate . Chronic venous insufficiency   varicose vein lower extremity with inflammation . Coronary artery disease 1996  two stents placed  . Diabetes mellitus without complication ([place])   type 2 on metformin . GERD (gastroesophageal reflux disease)   no issues since gastric bypass surgery as stated per pt . Hyperlipidemia  . Hypogonadism in male  . MRSA (methicillin resistant Staphylococcus aureus) infection   07/30/2008 thru 08/07/2008 . Sleep apnea   on BIPAP . Stented coronary artery  . Stroke ([place])  . Thrombocythemia [facility])  Past Surgical History: Past Surgical History: Procedure Laterality Date . ANGIOPLASTY   . ANGIOPLASTY    with stent 04/07/1995  . ANTERIOR CERVICAL DECOMPRESSION/DISCECTOMY FUSION 4 LEVELS N/A 08/17/2017  Procedure: Anterior discectomy with fusion and plate fixation Cervical Three-Four, Four-Five, Five-Six, and Six-Seven Fusion;  Surgeon: Ditty, Kevan Ny, MD;  Location: [place];  Service: Neurosurgery;  Laterality: N/A;  Anterior discectomy with fusion and plate fixation Cervical Three-Four, Four-Five, Five-Six, and Six-Seven Fusion  . APPENDECTOMY    1966 . BUBBLE STUDY  09/20/2018  Procedure: BUBBLE STUDY;  Surgeon: Josue Hector, MD;  Location: [place];  Service: Cardiovascular;; . [place] . CARDIOVASCULAR STRESS TEST    07/31/2011 . CARPAL TUNNEL RELEASE Left  . CHOLECYSTECTOMY    2006 . colonscopy     08/25/2012 . EYE SURGERY Bilateral   cataract . FUNCTIONAL ENDOSCOPIC SINUS SURGERY    11/10/2013 . GASTRIC BYPASS  10/05/2012 . HERNIA REPAIR    left inguinal 1981 . INCISION AND DRAINAGE ABSCESS Right 02/26/2017  Procedure: INCISION AND DRAINAGE ABSCESS;  Surgeon: Nickie Retort, MD;  Location: [facility];  Service: Urology;  Laterality: Right; . JOINT REPLACEMENT    bilateral . left ankle surgery     05/03/2003  . left carpel tunnel     09/18/1993 . left knee meniscal tear     01/25/2010 . left knee meniscal tear repair     05/04/1996 . left rotator cuff repair     05/03/2003  . LOOP RECORDER INSERTION N/A 09/20/2018  Procedure: LOOP RECORDER INSERTION;  Surgeon: Evans Lance, MD;  Location: [place] [facility];  Service: Cardiovascular;  Laterality: N/A; . REPLACEMENT TOTAL KNEE BILATERAL  07/13/2015 . right ankle surgery     fracture has 2 screws 07/07/1997 . right carpel tunnel     05/16/1992 . right shoulder replacement     01/27/2006 . SCROTAL EXPLORATION Right 02/26/2017  Procedure: SCROTUM EXPLORATION;  Surgeon: Nickie Retort, MD;  Location: [facility];  Service: Urology;  Laterality: Right; . TEE WITHOUT CARDIOVERSION N/A 09/20/2018  Procedure: TRANSESOPHAGEAL ECHOCARDIOGRAM (TEE);  Surgeon: Josue Hector, MD;  Location: [facility];  Service: Cardiovascular;  Laterality: N/A; . TOTAL KNEE ARTHROPLASTY Left 07/13/2015  Procedure: LEFT TOTAL KNEE ARTHROPLASTY;  Surgeon: Gaynelle Arabian, MD;  Location: [facility];  Service: Orthopedics;  Laterality: Left; HPI: Pt is a 69 yo male admitted secondary to AMS and concerns of seizure. [name] MRI revealed a questionable acute infarct in the left parietal white matter versus persistent restricted diffusion at the site of a chronic infarct is noted. PMH includes CVA, CAD s/p PCI, DM, HTN, OSA on CPAP, and prostate cancer. Chest CT widespread areas of mixed ground-glass and consolidative opacity. Findings could be seen in the setting of aspiration pneumonia/pneumonitis. Spoke with MD who is conerned with a baseline dysphagia causing pna versus aspiration during/after seizure.  Subjective: Pt seated at EOB. Wife present Assessment / Plan / Recommendation CHL IP CLINICAL IMPRESSIONS 09/13/2019 Clinical Impression Pt exhibits oropharyngeal dysphagia with aspiration of thin barium consistency. There was reduced oral control resulting in barium spilling diffusely under tongue and lingual residue post swallow. The strength of his swallow is within normal range although timing of epiglottic deflection is late and thin barium via straw entered laryngeal vestibule with trace aspiration not sensed. On one ocassion pt extended head posteriorally in combination with reduced oral control leading to penetration onto vocal cords. When small sips thin from cup used with head in neutral there was no penetration or aspiration consistently over subsequent trials. He orally propelled pill and thin liquid entered vestibule but exited during the swallow. Pt coughed hard once although no barium seen in vestibule at that time. His respirations remained stable throughout. Recommend pt continue regular texture/thin liquids, NO straws, small sips in upright position. Take breaks if dyspneic with meal. ST will  continue to follow for safety.   SLP Visit Diagnosis Dysphagia, oropharyngeal phase (R13.12) Attention and concentration deficit following -- Frontal lobe and executive function deficit following -- Impact on safety and function Moderate aspiration risk   CHL IP TREATMENT RECOMMENDATION 09/13/2019 Treatment Recommendations Therapy as outlined in treatment plan below   Prognosis 09/13/2019 Prognosis for Safe Diet Advancement Good Barriers to Reach Goals -- Barriers/Prognosis Comment -- CHL IP DIET RECOMMENDATION 09/13/2019 SLP Diet Recommendations Regular solids;Thin liquid Liquid Administration via Cup;No straw Medication Administration Whole meds with puree Compensations -- Postural  Changes --   CHL IP OTHER RECOMMENDATIONS 09/13/2019 Recommended Consults -- Oral Care Recommendations Oral care BID Other Recommendations --   CHL IP FOLLOW UP RECOMMENDATIONS 09/13/2019 Follow up Recommendations Other (comment)   CHL IP FREQUENCY AND DURATION 09/13/2019 Speech Therapy Frequency (ACUTE ONLY) min 2x/week Treatment Duration 2 weeks      CHL IP ORAL PHASE 09/13/2019 Oral Phase Impaired Oral - Pudding Teaspoon -- Oral - Pudding Cup -- Oral - Honey Teaspoon -- Oral - Honey Cup -- Oral - Nectar Teaspoon -- Oral - Nectar Cup -- Oral - Nectar Straw -- Oral - Thin Teaspoon -- Oral - Thin Cup Incomplete tongue to palate contact;Lingual/palatal residue;Decreased bolus cohesion Oral - Thin Straw Lingual/palatal residue;Decreased bolus cohesion Oral - Puree -- Oral - Mech Soft -- Oral - Regular Lingual/palatal residue Oral - Multi-Consistency -- Oral - Pill WFL Oral Phase - Comment --  CHL IP PHARYNGEAL PHASE 09/13/2019 Pharyngeal Phase Impaired Pharyngeal- Pudding Teaspoon -- Pharyngeal -- Pharyngeal- Pudding Cup -- Pharyngeal -- Pharyngeal- Honey Teaspoon -- Pharyngeal -- Pharyngeal- Honey Cup -- Pharyngeal -- Pharyngeal- Nectar Teaspoon -- Pharyngeal -- Pharyngeal- Nectar Cup -- Pharyngeal -- Pharyngeal- Nectar Straw -- Pharyngeal --  Pharyngeal- Thin Teaspoon -- Pharyngeal -- Pharyngeal- Thin Cup Penetration/Aspiration during swallow;Pharyngeal residue - valleculae Pharyngeal Material enters airway, CONTACTS cords and not ejected out Pharyngeal- Thin Straw Penetration/Aspiration during swallow Pharyngeal Material enters airway, passes BELOW cords without attempt by patient to eject out (silent aspiration) Pharyngeal- Puree -- Pharyngeal -- Pharyngeal- Mechanical Soft -- Pharyngeal -- Pharyngeal- Regular WFL Pharyngeal -- Pharyngeal- Multi-consistency -- Pharyngeal -- Pharyngeal- Pill WFL Pharyngeal -- Pharyngeal Comment --  CHL IP CERVICAL ESOPHAGEAL PHASE 09/13/2019 Cervical Esophageal Phase WFL Pudding Teaspoon -- Pudding Cup -- Honey Teaspoon -- Honey Cup -- Nectar Teaspoon -- Nectar Cup -- Nectar Straw -- Thin Teaspoon -- Thin Cup -- Thin Straw -- Puree -- Mechanical Soft -- Regular -- Multi-consistency -- Pill -- Cervical Esophageal Comment -- Houston Siren 09/14/2019, 8:49 AM                   Scheduled Meds: .  stroke: mapping our early stages of recovery book   Does not apply Once  . aspirin EC  81 mg Oral Daily  . atorvastatin  40 mg Oral QHS  . budesonide (PULMICORT) nebulizer solution  0.5 mg Nebulization BID  . enoxaparin (LOVENOX) injection  40 mg Subcutaneous Q24H  . insulin aspart  0-15 Units Subcutaneous TID WC  . insulin aspart  0-5 Units Subcutaneous QHS  . insulin aspart  2 Units Subcutaneous TID WC  . ipratropium-albuterol  3 mL Nebulization TID  . levETIRAcetam  500 mg Oral BID  . mouth rinse  15 mL Mouth Rinse BID   Continuous Infusions:    LOS: 6 days   Time spent= 25 mins    Shreshta Medley Arsenio Loader, MD [facility]  If 7PM-7AM, please contact night-coverage  09/14/2019, 11:43 AM

## 2019-09-14 NOTE — Progress Notes (Addendum)
Modified Barium Swallow Progress Note  Patient Details  Name: Andrew Cobb MRN: [id number] Date of Birth: Apr 09, 1951  Today's Date: 09/14/2019  Modified Barium Swallow completed.  Full report located under Chart Review in the Imaging Section.  Brief recommendations include the following:  Clinical Impression    Pt exhibits oropharyngeal dysphagia with aspiration of thin barium consistency and suspected esophageal dysphagia. There was reduced oral control resulting in barium spilling diffusely under tongue and lingual residue post swallow. The strength of his swallow is within normal range although timing of epiglottic deflection is late and thin barium via straw entered laryngeal vestibule with trace aspiration not sensed. On one ocassion pt extended head posteriorally in combination with reduced oral control leading to penetration onto vocal cords. When small sips thin from cup used with head in neutral there was no penetration or aspiration consistently over subsequent trials. He orally propelled pill and thin liquid entered vestibule but exited during the swallow. Pt coughed hard once although no barium seen in vestibule at that time. His respirations remained stable throughout. MBS does not diagnose below UES - mild esophageal stasis observed. Recommend pt continue regular texture/thin liquids, NO straws, small sips in upright position. Take breaks if dyspneic with meal. ST will continue to follow for safety.    Swallow Evaluation Recommendations     Regular texture, thin liquids, NO straws, pills in puree, small sips                                   Houston Siren 09/14/2019,8:50 AM

## 2019-09-14 NOTE — Progress Notes (Signed)
SATURATION QUALIFICATIONS: (This note is used to comply with regulatory documentation for home oxygen)  Patient Saturations on 4L at Rest = 95%  Patient Saturations on 4L while Ambulating = 78%  Patient Saturations on 10 Liters of oxygen while Ambulating = 88%  Please briefly explain why patient needs home oxygen: Pt appears to need home oxygen. Oxygen saturations dropped while ambulating on 4L to 78%. 10L Oxygen needed during ambulation to maintain 88%.

## 2019-09-14 NOTE — Progress Notes (Signed)
Physical Therapy Treatment Patient Details Name: Andrew Cobb MRN: [id number] DOB: [birth year] Today's Date: 09/14/2019    History of Present Illness Pt is a 69 y/o male admitted secondary to AMS. MRI revealed a questionable acute infarct in the left parietal white matter versus persistent restricted diffusion at the site of a chronic infarct is noted. PMH includes CVA, CAD s/p PCI, DM, HTN, OSA on CPAP, and prostate cancer.     PT Comments    Pt resting on 4L supp O2 via HFNC. Resting SpO2 is at 91%. Pt's wife is present in the room. She is agitated that pt has not received his lunch yet, RN notified. Pt is able to stand min guard and ambulates 100' with min guard to min A for steadiness with RW. Pt desats to 80% while ambulating. He quickly increases SpO2 into the low 90s with pursed lip breathing cuing. Pt performs sit<>stands x 4 slowly and with increased rest breaks in between. Pt educated on IS use. D/C plans remain appropriate. PT will continue to follow pt acutely.   Follow Up Recommendations  Home health PT;Supervision/Assistance - 24 hour     Equipment Recommendations  None recommended by PT       Precautions / Restrictions Precautions Precautions: Fall Precaution Comments: Desat Restrictions Weight Bearing Restrictions: No    Mobility  Bed Mobility               General bed mobility comments: Pt found sitting EOB.  Transfers Overall transfer level: Needs assistance Equipment used: Rolling walker (2 wheeled) Transfers: Sit to/from Stand Sit to Stand: Min guard         General transfer comment: cues for hand placement  Ambulation/Gait Ambulation/Gait assistance: Min guard;Min assist Gait Distance (Feet): 100 Feet Assistive device: Rolling walker (2 wheeled) Gait Pattern/deviations: Step-through pattern;Decreased stride length;Narrow base of support;Decreased step length - right;Decreased step length - left Gait velocity: Decreased Gait velocity  interpretation: 1.31 - 2.62 ft/sec, indicative of limited community ambulator General Gait Details: Min guard for straight ambulation, min A for turning and steadying         Balance Overall balance assessment: Needs assistance Sitting-balance support: No upper extremity supported;Feet supported Sitting balance-Leahy Scale: Good Sitting balance - Comments: Pt found sitting EOB, no balance concerns   Standing balance support: Bilateral upper extremity supported;During functional activity Standing balance-Leahy Scale: Fair Standing balance comment: reliant on UE support                            Cognition Arousal/Alertness: Awake/alert Behavior During Therapy: Flat affect Overall Cognitive Status: Impaired/Different from baseline Area of Impairment: Orientation;Attention;Memory;Safety/judgement;Problem solving;Awareness                 Orientation Level: Disoriented to;Situation;Time Current Attention Level: Selective Memory: Decreased short-term memory;Decreased recall of precautions   Safety/Judgement: Decreased awareness of safety Awareness: Emergent Problem Solving: Slow processing;Difficulty sequencing;Requires verbal cues General Comments: Limited conversation, pt reports feeling "so-so" for repetitive questioning.      Exercises Other Exercises Other Exercises: pursed lip breathing Other Exercises: IS x 10. Max inspiration 1100 mL. Other Exercises: Sit<>stand x 4.    General Comments General comments (skin integrity, edema, etc.): Pt is on 4L of supp O2 via HFNC. Pt resting at 91% SpO2. During functional mobility pt desats to 80%. With pursed lip breathing cuing pt is able to increase SpO2 quickly back into the low 90s. Pt desats to 85% with repeated  sit<>stand training.       Pertinent Vitals/Pain Pain Assessment: No/denies pain           PT Goals (current goals can now be found in the care plan section) Acute Rehab PT Goals Patient Stated  Goal: home PT Goal Formulation: With patient/family Time For Goal Achievement: 09/22/19 Potential to Achieve Goals: Good Progress towards PT goals: Progressing toward goals    Frequency    Min 4X/week      PT Plan Current plan remains appropriate       AM-PAC PT "6 Clicks" Mobility   Outcome Measure  Help needed turning from your back to your side while in a flat bed without using bedrails?: A Little Help needed moving from lying on your back to sitting on the side of a flat bed without using bedrails?: A Little Help needed moving to and from a bed to a chair (including a wheelchair)?: A Little Help needed standing up from a chair using your arms (e.g., wheelchair or bedside chair)?: A Little Help needed to walk in hospital room?: A Little Help needed climbing 3-5 steps with a railing? : A Lot 6 Click Score: 17    End of Session Equipment Utilized During Treatment: Gait belt;Oxygen Activity Tolerance: Patient tolerated treatment well;Other (comment)(limited with desats) Patient left: in bed;with call bell/phone within reach;with family/visitor present Nurse Communication: Mobility status;Other (comment)(pt not receiving lunch yet) PT Visit Diagnosis: Unsteadiness on feet (R26.81);Muscle weakness (generalized) (M62.81)     Time: [id number] PT Time Calculation (min) (ACUTE ONLY): 32 min  Charges:  $Gait Training: 8-22 mins $Therapeutic Activity: 8-22 mins                     Jodelle Green, PT, DPT Acute Rehabilitation Services Office [phone]    Jodelle Green 09/14/2019, 3:31 PM

## 2019-09-14 NOTE — Patient Outreach (Signed)
[facility]) Care Management  09/14/2019  [place] 1951/05/08 [id number]  Error.  Andrew Cobb, [place]  [phone]

## 2019-09-15 LAB — BASIC METABOLIC PANEL
Anion gap: 11 (ref 5–15)
BUN: 21 mg/dL (ref 8–23)
CO2: 25 mmol/L (ref 22–32)
Calcium: 8.3 mg/dL — ABNORMAL LOW (ref 8.9–10.3)
Chloride: 96 mmol/L — ABNORMAL LOW (ref 98–111)
Creatinine, Ser: 1.06 mg/dL (ref 0.61–1.24)
GFR calc Af Amer: >60 mL/min (ref >60)
GFR calc non Af Amer: >60 mL/min (ref >60)
Glucose, Bld: 157 mg/dL — ABNORMAL HIGH (ref 70–99)
Potassium: 3.5 mmol/L (ref 3.5–5.1)
Sodium: 132 mmol/L — ABNORMAL LOW (ref 135–145)

## 2019-09-15 LAB — CBC
HCT: 35.1 % — ABNORMAL LOW (ref 39.0–52.0)
Hemoglobin: 11.4 g/dL — ABNORMAL LOW (ref 13.0–17.0)
MCH: 30.2 pg (ref 26.0–34.0)
MCHC: 32.5 g/dL (ref 30.0–36.0)
MCV: 92.9 fL (ref 80.0–100.0)
Platelets: 221 10*3/uL (ref 150–400)
RBC: 3.78 MIL/uL — ABNORMAL LOW (ref 4.22–5.81)
RDW: 12.8 % (ref 11.5–15.5)
WBC: 4.4 10*3/uL (ref 4.0–10.5)
nRBC: 0 % (ref 0.0–0.2)

## 2019-09-15 LAB — MAGNESIUM: Magnesium: 1.9 mg/dL (ref 1.7–2.4)

## 2019-09-15 LAB — GLUCOSE, CAPILLARY
Glucose-Capillary: 123 mg/dL — ABNORMAL HIGH (ref 70–99)
Glucose-Capillary: 194 mg/dL — ABNORMAL HIGH (ref 70–99)
Glucose-Capillary: 174 mg/dL — ABNORMAL HIGH (ref 70–99)

## 2019-09-15 MED ORDER — IPRATROPIUM-ALBUTEROL 0.5-2.5 (3) MG/3ML IN SOLN
3.0000 mL | Freq: Three times a day (TID) | RESPIRATORY_TRACT | Status: DC
  Administered 2019-09-15 – 2019-09-16 (×4): 3 mL via RESPIRATORY_TRACT
  Filled 2019-09-15 (×4): qty 3

## 2019-09-15 NOTE — Progress Notes (Signed)
PROGRESS NOTE    Andrew Cobb  [id number] DOB: July 20, 1950 DOA: 09/07/2019 PCP: Guadalupe Maple, MD   Brief Narrative:  69 year old with history of CVA with residual speech deficit, hemorrhagic conversion from ischemic stroke, CAD status post PCI, DM 2, HTN, HLD, OSA on CPAP initially came to ER with change in mental status.  MRI showed acute CVA along with chronic changes, neurology team was consulted.  There was also concerns of seizure therefore started on Keppra.  CT of the chest without contrast showed groundglass opacity concerns for pneumonitis/pneumonia.   Assessment & Plan:   Principal Problem:   Altered mental status Active Problems:   OSA (obstructive sleep apnea)   Hypertension associated with diabetes ([place])   Uncontrolled type 2 diabetes mellitus with hyperglycemia, without long-term current use of insulin (HCC)   Coronary artery disease   Hyperlipidemia associated with type 2 diabetes mellitus ([facility])   History of CVA (cerebrovascular accident)   Acute encephalopathy   Bilateral pneumonia  Acute encephalopathy, persist Acute seizures, Acute CVA 44mm left parietal white matter per MRI but neurology thinks is chronic.  History of recurrent embolic infarcts/CVA with hemorrhagic conversion in July 2020 -Status post loading dose of Keppra, Keppra 500 mg twice daily -EEG-negative for seizures but shows encephalopathy.  Continue seizure precaution -Per neurology team patient has chronic CVA. -Aspirin 81 mg, atorvastatin 40 mg daily -CT head-negative -UA-shows glucosuria but no evidence of infection  Bilateral multifocal infiltrate Aspiration pneumonitis/pneumonia in the setting of seizure -Ambulatory pulse ox still dropping down to 83% requiring 4 L nasal cannula.  This appears to have slightly improved from yesterday. -COVID-19-negative  -Procalcitonin-negative, BNP Normal.  Discontinue Zosyn after today -Respiratory panel-negative -Echocardiogram March 2020-EF 60  to 65%, mild LVH -I-S//flutter.  Scheduled and as needed bronchodilators -CT chest consistent with bilateral infiltrates-aspiration? - aspiration precautions. -Seen by speech team status post MBS-recommending regular diet  Acute kidney injury, prerenal -Appears to have resolved.  Creatinine at baseline around 1.1  Coronary artery disease status post PCI -Currently chest pain-free.  Continue aspirin and statin  Essential hypertension -Benzapril on hold due to AKI  D M2, poorly controlled due to hyperglycemia -Home p.o. meds on hold, A1c 9.2 -Sliding scale Accu-Cheks  Hyperlipidemia -Statin  OSA -CPAP  PT-Home health.   DVT prophylaxis: Lovenox Code Status: DNR Family Communication: [name] updated.  Disposition Plan:   Patient From= Home  Patient Anticipated D/C place= Home with home health  Barriers= still hypoxic requiring 4-8 L nasal cannula.  Hopefully we can discharge him tomorrow with supplemental O2.  Subjective: Sitting at the side of bed, no complaints.  With ambulation his oxygen saturation dropped on tube 83%, required 8 L to recover and then wean down again to 4 L nasal cannula.  Review of Systems Otherwise negative except as per HPI, including: General = no fevers, chills, dizziness,  fatigue HEENT/EYES = negative for loss of vision, double vision, blurred vision,  sore throa Cardiovascular= negative for chest pain, palpitation Respiratory/lungs= negative forwheezing; hemoptysis,  Gastrointestinal= negative for nausea, vomiting, abdominal pain Genitourinary= negative for Dysuria MSK = Negative for arthralgia, myalgias Neurology= Negative for headache, numbness, tingling  Psychiatry= Negative for suicidal and homocidal ideation Skin= Negative for Rash   Examination: Constitutional: Not in acute distress, 4 L nasal cannula Respiratory: Bilateral rhonchi Cardiovascular: Normal sinus rhythm, no rubs Abdomen: Nontender nondistended good bowel  sounds Musculoskeletal: No edema noted Skin: No rashes seen Neurologic: CN 2-12 grossly intact.  And nonfocal Psychiatric: Alert to  name, place and year.  Poor judgment and insight  Objective: Vitals:   09/14/19 2146 09/15/19 0744 09/15/19 0817 09/15/19 0920  BP: 127/75  137/77   Pulse: 75  68   Resp: 18  19   Temp: 98 F (36.7 C)  97.9 F (36.6 C)   TempSrc: Axillary     SpO2: 92% 90% 95% 92%  Weight:      Height:        Intake/Output Summary (Last 24 hours) at 09/15/2019 1112 Last data filed at 09/15/2019 0700 Gross per 24 hour  Intake --  Output 650 ml  Net -650 ml   Filed Weights   09/08/19 0816  Weight: 90.7 kg     Data Reviewed:   CBC: Recent Labs  Lab 09/11/19 0144 09/12/19 0510 09/13/19 0052 09/14/19 0219 09/15/19 0228  WBC 5.6 5.1 4.8 5.2 4.4  HGB 12.7* 12.0* 12.0* 12.7* 11.4*  HCT 38.8* 36.5* 36.8* 38.9* 35.1*  MCV 92.4 91.7 92.2 93.5 92.9  PLT 205 212 236 250 [id number]   Basic Metabolic Panel: Recent Labs  Lab 09/11/19 0144 09/12/19 0408 09/13/19 0052 09/14/19 0219 09/15/19 0228  NA 136 135 135 135 132*  K 3.9 3.7 3.7 3.7 3.5  CL 102 99 100 98 96*  CO2 24 24 25 26 25   GLUCOSE 167* 193* 206* 168* 157*  BUN 20 20 18 22 21   CREATININE 1.12 1.11 1.02 1.06 1.06  CALCIUM 8.1* 8.0* 8.3* 8.6* 8.3*  MG 2.1 2.0 2.1 2.2 1.9   GFR: Estimated Creatinine Clearance: 75.6 mL/min (by C-G formula based on SCr of 1.06 mg/dL). Liver Function Tests: Recent Labs  Lab 09/08/19 1528 09/09/19 0205 09/10/19 0231 09/11/19 0144 09/12/19 0408  AST 50* 52* 54* 49* 55*  ALT 36 36 38 36 41  ALKPHOS 51 51 51 50 51  BILITOT 1.7* 1.6* 2.1* 2.3* 1.8*  PROT 5.7* 5.9* 5.5* 5.6* 5.6*  ALBUMIN 2.8* 2.9* 2.6* 2.5* 2.2*   No results for input(s): LIPASE, AMYLASE in the last 168 hours. No results for input(s): AMMONIA in the last 168 hours. Coagulation Profile: No results for input(s): INR, PROTIME in the last 168 hours. Cardiac Enzymes: No results for input(s):  CKTOTAL, CKMB, CKMBINDEX, TROPONINI in the last 168 hours. BNP (last 3 results) No results for input(s): PROBNP in the last 8760 hours. HbA1C: No results for input(s): HGBA1C in the last 72 hours. CBG: Recent Labs  Lab 09/14/19 1135 09/14/19 1528 09/14/19 1921 09/14/19 2102 09/15/19 0722  GLUCAP 251* 185* 152* 187* 123*   Lipid Profile: No results for input(s): CHOL, HDL, LDLCALC, TRIG, CHOLHDL, LDLDIRECT in the last 72 hours. Thyroid Function Tests: No results for input(s): TSH, T4TOTAL, FREET4, T3FREE, THYROIDAB in the last 72 hours. Anemia Panel: No results for input(s): VITAMINB12, FOLATE, FERRITIN, TIBC, IRON, RETICCTPCT in the last 72 hours. Sepsis Labs: Recent Labs  Lab 09/08/19 1528 09/13/19 0052  PROCALCITON <0.10 <0.10    Recent Results (from the past 240 hour(s))  Respiratory Panel by PCR     Status: None   Collection Time: 09/08/19  2:57 PM   Specimen: Nasopharyngeal Swab; Respiratory  Result Value Ref Range Status   Adenovirus NOT DETECTED NOT DETECTED Final   Coronavirus 229E NOT DETECTED NOT DETECTED Final    Comment: (NOTE) The Coronavirus on the Respiratory Panel, DOES NOT test for the novel  Coronavirus (2019 nCoV)    Coronavirus HKU1 NOT DETECTED NOT DETECTED Final   Coronavirus NL63 NOT DETECTED NOT DETECTED Final  Coronavirus OC43 NOT DETECTED NOT DETECTED Final   Metapneumovirus NOT DETECTED NOT DETECTED Final   Rhinovirus / Enterovirus NOT DETECTED NOT DETECTED Final   Influenza A NOT DETECTED NOT DETECTED Final   Influenza B NOT DETECTED NOT DETECTED Final   Parainfluenza Virus 1 NOT DETECTED NOT DETECTED Final   Parainfluenza Virus 2 NOT DETECTED NOT DETECTED Final   Parainfluenza Virus 3 NOT DETECTED NOT DETECTED Final   Parainfluenza Virus 4 NOT DETECTED NOT DETECTED Final   Respiratory Syncytial Virus NOT DETECTED NOT DETECTED Final   Bordetella pertussis NOT DETECTED NOT DETECTED Final   Chlamydophila pneumoniae NOT DETECTED NOT  DETECTED Final   Mycoplasma pneumoniae NOT DETECTED NOT DETECTED Final    Comment: Performed at [facility], [place] [address] [place], [place] [address]  MRSA PCR Screening     Status: Abnormal   Collection Time: 09/08/19 11:24 PM   Specimen: Nasopharyngeal  Result Value Ref Range Status   MRSA by PCR POSITIVE (A) NEGATIVE Final    Comment:        The GeneXpert MRSA Assay (FDA approved for NASAL specimens only), is one component of a comprehensive MRSA colonization surveillance program. It is not intended to diagnose MRSA infection nor to guide or monitor treatment for MRSA infections. RESULT CALLED TO, READ BACK BY AND VERIFIED WITH: A ROSS RN 09/09/19 0059 JDW Performed at [facility], [place] [address], [place], [place] [address]          Radiology Studies: DG Swallowing Func-Speech Pathology  Result Date: 09/14/2019 Objective Swallowing Evaluation: Type of Study: MBS-Modified Barium Swallow Study  Patient Details Name: KNOXX BAETZ MRN: [id number] Date of Birth: 1951-01-12 Today's Date: 09/14/2019 Time: SLP Start Time (ACUTE ONLY): 1310 -SLP Stop Time (ACUTE ONLY): 1324 SLP Time Calculation (min) (ACUTE ONLY): 14 min Past Medical History: Past Medical History: Diagnosis Date . Arthritis  . Asthma  . Cancer [facility])   prostate . Chronic venous insufficiency   varicose vein lower extremity with inflammation . Coronary artery disease 1996  two stents placed  . Diabetes mellitus without complication ([place])   type 2 on metformin . GERD (gastroesophageal reflux disease)   no issues since gastric bypass surgery as stated per pt . Hyperlipidemia  . Hypogonadism in male  . MRSA (methicillin resistant Staphylococcus aureus) infection   07/30/2008 thru 08/07/2008 . Sleep apnea   on BIPAP . Stented coronary artery  . Stroke ([place])  . Thrombocythemia [facility])  Past Surgical History: Past Surgical History: Procedure Laterality Date . ANGIOPLASTY   . ANGIOPLASTY    with stent 04/07/1995 . ANTERIOR  CERVICAL DECOMPRESSION/DISCECTOMY FUSION 4 LEVELS N/A 08/17/2017  Procedure: Anterior discectomy with fusion and plate fixation Cervical Three-Four, Four-Five, Five-Six, and Six-Seven Fusion;  Surgeon: Ditty, Kevan Ny, MD;  Location: [facility];  Service: Neurosurgery;  Laterality: N/A;  Anterior discectomy with fusion and plate fixation Cervical Three-Four, Four-Five, Five-Six, and Six-Seven Fusion  . APPENDECTOMY    1966 . BUBBLE STUDY  09/20/2018  Procedure: BUBBLE STUDY;  Surgeon: Josue Hector, MD;  Location: [facility];  Service: Cardiovascular;; . [place] . CARDIOVASCULAR STRESS TEST    07/31/2011 . CARPAL TUNNEL RELEASE Left  . CHOLECYSTECTOMY    2006 . colonscopy     08/25/2012 . EYE SURGERY Bilateral   cataract . FUNCTIONAL ENDOSCOPIC SINUS SURGERY    11/10/2013 . GASTRIC BYPASS    10/05/2012 . HERNIA REPAIR    left inguinal 1981 . INCISION  AND DRAINAGE ABSCESS Right 02/26/2017  Procedure: INCISION AND DRAINAGE ABSCESS;  Surgeon: Nickie Retort, MD;  Location: [facility];  Service: Urology;  Laterality: Right; . JOINT REPLACEMENT    bilateral . left ankle surgery     05/03/2003  . left carpel tunnel     09/18/1993 . left knee meniscal tear     01/25/2010 . left knee meniscal tear repair     05/04/1996 . left rotator cuff repair     05/03/2003  . LOOP RECORDER INSERTION N/A 09/20/2018  Procedure: LOOP RECORDER INSERTION;  Surgeon: Evans Lance, MD;  Location: [facility];  Service: Cardiovascular;  Laterality: N/A; . REPLACEMENT TOTAL KNEE BILATERAL  07/13/2015 . right ankle surgery     fracture has 2 screws 07/07/1997 . right carpel tunnel     05/16/1992 . right shoulder replacement     01/27/2006 . SCROTAL EXPLORATION Right 02/26/2017  Procedure: SCROTUM EXPLORATION;  Surgeon: Nickie Retort, MD;  Location: [facility];  Service: Urology;  Laterality: Right; . TEE WITHOUT CARDIOVERSION N/A 09/20/2018  Procedure: TRANSESOPHAGEAL ECHOCARDIOGRAM (TEE);  Surgeon: Josue Hector,  MD;  Location: [facility];  Service: Cardiovascular;  Laterality: N/A; . TOTAL KNEE ARTHROPLASTY Left 07/13/2015  Procedure: LEFT TOTAL KNEE ARTHROPLASTY;  Surgeon: Gaynelle Arabian, MD;  Location: [facility];  Service: Orthopedics;  Laterality: Left; HPI: Pt is a 69 yo male admitted secondary to AMS and concerns of seizure. [name] Kitchen MRI revealed a questionable acute infarct in the left parietal white matter versus persistent restricted diffusion at the site of a chronic infarct is noted. PMH includes CVA, CAD s/p PCI, DM, HTN, OSA on CPAP, and prostate cancer. Chest CT widespread areas of mixed ground-glass and consolidative opacity. Findings could be seen in the setting of aspiration pneumonia/pneumonitis. Spoke with MD who is conerned with a baseline dysphagia causing pna versus aspiration during/after seizure.  Subjective: Pt seated at EOB. Wife present Assessment / Plan / Recommendation CHL IP CLINICAL IMPRESSIONS 09/13/2019 Clinical Impression Pt exhibits oropharyngeal dysphagia with aspiration of thin barium consistency. There was reduced oral control resulting in barium spilling diffusely under tongue and lingual residue post swallow. The strength of his swallow is within normal range although timing of epiglottic deflection is late and thin barium via straw entered laryngeal vestibule with trace aspiration not sensed. On one ocassion pt extended head posteriorally in combination with reduced oral control leading to penetration onto vocal cords. When small sips thin from cup used with head in neutral there was no penetration or aspiration consistently over subsequent trials. He orally propelled pill and thin liquid entered vestibule but exited during the swallow. Pt coughed hard once although no barium seen in vestibule at that time. His respirations remained stable throughout. Recommend pt continue regular texture/thin liquids, NO straws, small sips in upright position. Take breaks if dyspneic with meal. ST will continue  to follow for safety.   SLP Visit Diagnosis Dysphagia, oropharyngeal phase (R13.12) Attention and concentration deficit following -- Frontal lobe and executive function deficit following -- Impact on safety and function Moderate aspiration risk   CHL IP TREATMENT RECOMMENDATION 09/13/2019 Treatment Recommendations Therapy as outlined in treatment plan below   Prognosis 09/13/2019 Prognosis for Safe Diet Advancement Good Barriers to Reach Goals -- Barriers/Prognosis Comment -- CHL IP DIET RECOMMENDATION 09/13/2019 SLP Diet Recommendations Regular solids;Thin liquid Liquid Administration via Cup;No straw Medication Administration Whole meds with puree Compensations -- Postural Changes --   CHL IP OTHER RECOMMENDATIONS 09/13/2019 Recommended Consults --  Oral Care Recommendations Oral care BID Other Recommendations --   CHL IP FOLLOW UP RECOMMENDATIONS 09/13/2019 Follow up Recommendations Other (comment)   CHL IP FREQUENCY AND DURATION 09/13/2019 Speech Therapy Frequency (ACUTE ONLY) min 2x/week Treatment Duration 2 weeks      CHL IP ORAL PHASE 09/13/2019 Oral Phase Impaired Oral - Pudding Teaspoon -- Oral - Pudding Cup -- Oral - Honey Teaspoon -- Oral - Honey Cup -- Oral - Nectar Teaspoon -- Oral - Nectar Cup -- Oral - Nectar Straw -- Oral - Thin Teaspoon -- Oral - Thin Cup Incomplete tongue to palate contact;Lingual/palatal residue;Decreased bolus cohesion Oral - Thin Straw Lingual/palatal residue;Decreased bolus cohesion Oral - Puree -- Oral - Mech Soft -- Oral - Regular Lingual/palatal residue Oral - Multi-Consistency -- Oral - Pill WFL Oral Phase - Comment --  CHL IP PHARYNGEAL PHASE 09/13/2019 Pharyngeal Phase Impaired Pharyngeal- Pudding Teaspoon -- Pharyngeal -- Pharyngeal- Pudding Cup -- Pharyngeal -- Pharyngeal- Honey Teaspoon -- Pharyngeal -- Pharyngeal- Honey Cup -- Pharyngeal -- Pharyngeal- Nectar Teaspoon -- Pharyngeal -- Pharyngeal- Nectar Cup -- Pharyngeal -- Pharyngeal- Nectar Straw -- Pharyngeal -- Pharyngeal- Thin  Teaspoon -- Pharyngeal -- Pharyngeal- Thin Cup Penetration/Aspiration during swallow;Pharyngeal residue - valleculae Pharyngeal Material enters airway, CONTACTS cords and not ejected out Pharyngeal- Thin Straw Penetration/Aspiration during swallow Pharyngeal Material enters airway, passes BELOW cords without attempt by patient to eject out (silent aspiration) Pharyngeal- Puree -- Pharyngeal -- Pharyngeal- Mechanical Soft -- Pharyngeal -- Pharyngeal- Regular WFL Pharyngeal -- Pharyngeal- Multi-consistency -- Pharyngeal -- Pharyngeal- Pill WFL Pharyngeal -- Pharyngeal Comment --  CHL IP CERVICAL ESOPHAGEAL PHASE 09/13/2019 Cervical Esophageal Phase WFL Pudding Teaspoon -- Pudding Cup -- Honey Teaspoon -- Honey Cup -- Nectar Teaspoon -- Nectar Cup -- Nectar Straw -- Thin Teaspoon -- Thin Cup -- Thin Straw -- Puree -- Mechanical Soft -- Regular -- Multi-consistency -- Pill -- Cervical Esophageal Comment -- [place] Siren 09/14/2019, 8:49 AM                   Scheduled Meds: .  stroke: mapping our early stages of recovery book   Does not apply Once  . aspirin EC  81 mg Oral Daily  . atorvastatin  40 mg Oral QHS  . budesonide (PULMICORT) nebulizer solution  0.5 mg Nebulization BID  . enoxaparin (LOVENOX) injection  40 mg Subcutaneous Q24H  . insulin aspart  0-15 Units Subcutaneous TID WC  . insulin aspart  0-5 Units Subcutaneous QHS  . insulin aspart  2 Units Subcutaneous TID WC  . ipratropium-albuterol  3 mL Nebulization TID  . levETIRAcetam  500 mg Oral BID  . mouth rinse  15 mL Mouth Rinse BID   Continuous Infusions:    LOS: 7 days   Time spent= 25 mins    Overton Boggus Arsenio Loader, MD [facility]  If 7PM-7AM, please contact night-coverage  09/15/2019, 11:12 AM

## 2019-09-15 NOTE — Progress Notes (Signed)
Occupational Therapy Treatment Patient Details Name: Andrew Cobb MRN: [id number] DOB: 1950/09/10 Today's Date: 09/15/2019    History of present illness Pt is a 69 y/o male admitted secondary to AMS. MRI revealed a questionable acute infarct in the left parietal white matter versus persistent restricted diffusion at the site of a chronic infarct is noted. PMH includes CVA, CAD s/p PCI, DM, HTN, OSA on CPAP, and prostate cancer.    OT comments  Pt progressing towards acute OT goals. Focus of session was UB bathing and grooming task standing at sink, and ambulation in the room. Also discussed energy conservation strategies for basic ADLs and IADLs and provided handout. Spouse present during session. Pt on 4L O2 with O2 sat in the low 90s after OOB activity. D/c plan remains appropriate.    Follow Up Recommendations  Home health OT;Supervision - Intermittent    Equipment Recommendations  None recommended by OT    Recommendations for Other Services      Precautions / Restrictions Precautions Precautions: Fall Precaution Comments: Desat Restrictions Weight Bearing Restrictions: No       Mobility Bed Mobility               General bed mobility comments: Pt found sitting EOB.  Transfers Overall transfer level: Needs assistance Equipment used: None(pt declined using rw in the room) Transfers: Sit to/from Stand Sit to Stand: Min guard         General transfer comment: min guard for safety. to/from EOB and recliner    Balance Overall balance assessment: Needs assistance Sitting-balance support: No upper extremity supported;Feet supported Sitting balance-Leahy Scale: Good     Standing balance support: During functional activity;Single extremity supported;No upper extremity supported Standing balance-Leahy Scale: Fair Standing balance comment: intermittent single extremity support during dynamic standing activities                           ADL either  performed or assessed with clinical judgement   ADL       Grooming: Supervision/safety;Standing   Upper Body Bathing: Supervision/ safety;Standing                           Functional mobility during ADLs: Min guard General ADL Comments: Pt completed ambulation to bathroom, stood to void then stood to complete UB bathing and to wash hair in standing. Declined LB bathing. Up to recliner at end of session. Noted to remove [place] to wash face and needed cueing to place it back on him.  Energy conservation handout provided to pt and spouse.     Vision       Perception     Praxis      Cognition Arousal/Alertness: Awake/alert Behavior During Therapy: Flat affect Overall Cognitive Status: Impaired/Different from baseline Area of Impairment: Memory;Following commands;Attention;Safety/judgement;Awareness                 Orientation Level: Situation Current Attention Level: Selective Memory: Decreased short-term memory;Decreased recall of precautions Following Commands: Follows one step commands consistently;Follows multi-step commands consistently;Follows multi-step commands with increased time Safety/Judgement: Decreased awareness of safety Awareness: Emergent Problem Solving: Slow processing;Decreased initiation General Comments: limited responses. slow/delayed responses at times. Asking for cell phone as compensation strategy when asked what today's date is.         Exercises     Shoulder Instructions       General Comments On 4L O2 throughout session. O2  sat low 90s after OOB activity, HR upper 70s.    Pertinent Vitals/ Pain       Pain Assessment: No/denies pain  Home Living                                          Prior Functioning/Environment              Frequency  Min 3X/week        Progress Toward Goals  OT Goals(current goals can now be found in the care plan section)  Progress towards OT goals: Progressing toward  goals  Acute Rehab OT Goals Patient Stated Goal: home OT Goal Formulation: With patient Time For Goal Achievement: 09/23/19 Potential to Achieve Goals: Good ADL Goals Pt Will Perform Grooming: Independently;standing Pt Will Perform Upper Body Bathing: Independently;standing Pt Will Transfer to Toilet: with modified independence;ambulating Pt/caregiver will Perform Home Exercise Program: Increased strength;Both right and left upper extremity;Independently Additional ADL Goal #1: Patient will increase fine motor coordination and strength to independently maintain grip on tools.  Plan Discharge plan remains appropriate    Co-evaluation                 AM-PAC OT "6 Clicks" Daily Activity     Outcome Measure   Help from another person eating meals?: None Help from another person taking care of personal grooming?: A Little Help from another person toileting, which includes using toliet, bedpan, or urinal?: A Little Help from another person bathing (including washing, rinsing, drying)?: A Little Help from another person to put on and taking off regular upper body clothing?: A Little Help from another person to put on and taking off regular lower body clothing?: A Little 6 Click Score: 19    End of Session Equipment Utilized During Treatment: Oxygen  OT Visit Diagnosis: Unsteadiness on feet (R26.81);Muscle weakness (generalized) (M62.81);Other symptoms and signs involving cognitive function   Activity Tolerance Patient tolerated treatment well;Patient limited by fatigue   Patient Left in chair;with call bell/phone within reach;with family/visitor present   Nurse Communication          Time: [id number] OT Time Calculation (min): 29 min  Charges: OT General Charges $OT Visit: 1 Visit OT Treatments $Self Care/Home Management : 23-37 mins  Tyrone Schimke, OT Acute Rehabilitation Services Pager: [phone] Office: [phone]    Andrew Cobb 09/15/2019,  2:00 PM

## 2019-09-15 NOTE — Progress Notes (Signed)
Physical Therapy Treatment Patient Details Name: Andrew Cobb MRN: [id number] DOB: 04-10-1951 Today's Date: 09/15/2019    History of Present Illness Pt is a 69 y/o male admitted secondary to AMS. MRI revealed a questionable acute infarct in the left parietal white matter versus persistent restricted diffusion at the site of a chronic infarct is noted. PMH includes CVA, CAD s/p PCI, DM, HTN, OSA on CPAP, and prostate cancer.     PT Comments    Pt is on 4L of O2 via HFNC. Resting SpO2 is 95%. Upon exertion pt desats to 84% however it quickly rises into the 90s upon sitting down and cuing for proper breathing techniques. Pt reports he walked earlier with nursing without an AD. Pt's gait trialled without AD however at this time pt is recommended to use a RW at all times due to unsteadiness and balance and motor planning deficits. Pt and RN notified of this recommendation. All parties agreed. D/C plans remain appropriate. PT will continue to follow pt acutely.   Follow Up Recommendations  Home health PT;Supervision/Assistance - 24 hour     Equipment Recommendations  None recommended by PT       Precautions / Restrictions Precautions Precautions: Fall Precaution Comments: Desat Restrictions Weight Bearing Restrictions: No    Mobility  Bed Mobility Overal bed mobility: Modified Independent Bed Mobility: Supine to Sit     Supine to sit: Modified independent (Device/Increase time);HOB elevated     General bed mobility comments: mod I increased time and HOB elevated  Transfers Overall transfer level: Needs assistance Equipment used: None;Rolling walker (2 wheeled) Transfers: Sit to/from Stand Sit to Stand: Supervision         General transfer comment: supervision for safety, safe RW usage. Pt performed sit to stand once without AD and once with. See ambulation/ gait for further details.  Ambulation/Gait Ambulation/Gait assistance: Min guard;Min assist Gait Distance  (Feet): 150 Feet Assistive device: Rolling walker (2 wheeled);None Gait Pattern/deviations: Step-through pattern;Decreased stride length;Narrow base of support;Decreased step length - right;Decreased step length - left Gait velocity: Decreased Gait velocity interpretation: 1.31 - 2.62 ft/sec, indicative of limited community ambulator General Gait Details: Pt reports walking with nursing earlier without AD. Pt ambulates 1 bout without AD. He frequently grabs onto handrail when ambulating and is mildly unsteady. One LOB when turning in which min A was provided and pt recovered. Pt ambulates another bout with RW with improved balance and no LOB. Cuing for safe RW usage. Pt and RN educated to use AD at all times at this point for safety.          Balance Overall balance assessment: Needs assistance Sitting-balance support: No upper extremity supported;Feet supported Sitting balance-Leahy Scale: Good     Standing balance support: No upper extremity supported Standing balance-Leahy Scale: Fair Standing balance comment: intermittent single extremity support during dynamic standing activities                            Cognition Arousal/Alertness: Awake/alert Behavior During Therapy: Flat affect Overall Cognitive Status: Impaired/Different from baseline Area of Impairment: Memory;Following commands;Attention;Safety/judgement;Awareness                 Orientation Level: Situation Current Attention Level: Selective Memory: Decreased short-term memory;Decreased recall of precautions Following Commands: Follows one step commands consistently;Follows multi-step commands consistently;Follows multi-step commands with increased time Safety/Judgement: Decreased awareness of safety Awareness: Emergent Problem Solving: Slow processing;Decreased initiation General Comments: limited responses. slow/delayed  responses at times. Asking for cell phone as compensation strategy when asked  what today's date is.       Exercises Other Exercises Other Exercises: pursed lip breathing    General Comments General comments (skin integrity, edema, etc.): Pt is on 4L of O2. Resting SpO2 is 95%. With exertion SpO2 decreases to 84% however quickly improves into 90s upon rest and pursed lip breathing cues.      Pertinent Vitals/Pain Pain Assessment: No/denies pain           PT Goals (current goals can now be found in the care plan section) Acute Rehab PT Goals Patient Stated Goal: home PT Goal Formulation: With patient Time For Goal Achievement: 09/22/19 Potential to Achieve Goals: Good Progress towards PT goals: Progressing toward goals    Frequency    Min 4X/week      PT Plan Current plan remains appropriate       AM-PAC PT "6 Clicks" Mobility   Outcome Measure  Help needed turning from your back to your side while in a flat bed without using bedrails?: A Little Help needed moving from lying on your back to sitting on the side of a flat bed without using bedrails?: A Little Help needed moving to and from a bed to a chair (including a wheelchair)?: A Little Help needed standing up from a chair using your arms (e.g., wheelchair or bedside chair)?: None Help needed to walk in hospital room?: A Little Help needed climbing 3-5 steps with a railing? : A Lot 6 Click Score: 18    End of Session Equipment Utilized During Treatment: Gait belt;Oxygen Activity Tolerance: Patient tolerated treatment well;Other (comment) Patient left: in chair;with call bell/phone within reach;with chair alarm set Nurse Communication: Mobility status;Other (comment)(rec using RW at all times) PT Visit Diagnosis: Unsteadiness on feet (R26.81);Muscle weakness (generalized) (M62.81)     Time: [id number] PT Time Calculation (min) (ACUTE ONLY): 26 min  Charges:  $Gait Training: 23-37 mins                     Jodelle Green, PT, DPT Acute Rehabilitation Services Office [phone]    Jodelle Green 09/15/2019, 4:58 PM

## 2019-09-15 NOTE — Progress Notes (Addendum)
SATURATION QUALIFICATIONS: (This note is used to comply with regulatory documentation for home oxygen)  Patient Saturations on Room Air at Rest = 93%  Patient Saturations on Room Air while Ambulating = 88%  Patient Saturations on 8 Liters of oxygen while Ambulating = 93%  Please briefly explain why patient needs home oxygen:  Pt's oxygen saturations on room air was 93%.  Pt started ambulating and his sats dropped to 89%. Oxygen was applied at 4L [place].  Pt's oxygen ranged from 83-89% on 4L.  Pt was then increased to 8L.  Pt's oxygen saturation on 8L was from 85 to 93%.  Post ambulation pt's oxygen level was 95% within 1 minute of sitting back in the bed.

## 2019-09-15 NOTE — Plan of Care (Signed)
  Problem: Activity: Goal: Risk for activity intolerance will decrease Outcome: Progressing   Problem: Nutrition: Goal: Adequate nutrition will be maintained Outcome: Progressing   

## 2019-09-16 LAB — BASIC METABOLIC PANEL
Anion gap: 13 (ref 5–15)
BUN: 21 mg/dL (ref 8–23)
CO2: 25 mmol/L (ref 22–32)
Calcium: 8.2 mg/dL — ABNORMAL LOW (ref 8.9–10.3)
Chloride: 96 mmol/L — ABNORMAL LOW (ref 98–111)
Creatinine, Ser: 0.99 mg/dL (ref 0.61–1.24)
GFR calc Af Amer: >60 mL/min (ref >60)
GFR calc non Af Amer: >60 mL/min (ref >60)
Glucose, Bld: 159 mg/dL — ABNORMAL HIGH (ref 70–99)
Potassium: 4.2 mmol/L (ref 3.5–5.1)
Sodium: 134 mmol/L — ABNORMAL LOW (ref 135–145)

## 2019-09-16 LAB — CBC
HCT: 36.9 % — ABNORMAL LOW (ref 39.0–52.0)
Hemoglobin: 12.3 g/dL — ABNORMAL LOW (ref 13.0–17.0)
MCH: 31.2 pg (ref 26.0–34.0)
MCHC: 33.3 g/dL (ref 30.0–36.0)
MCV: 93.7 fL (ref 80.0–100.0)
Platelets: 218 10*3/uL (ref 150–400)
RBC: 3.94 MIL/uL — ABNORMAL LOW (ref 4.22–5.81)
RDW: 12.9 % (ref 11.5–15.5)
WBC: 4.4 10*3/uL (ref 4.0–10.5)
nRBC: 0 % (ref 0.0–0.2)

## 2019-09-16 LAB — GLUCOSE, CAPILLARY
Glucose-Capillary: 219 mg/dL — ABNORMAL HIGH (ref 70–99)
Glucose-Capillary: 142 mg/dL — ABNORMAL HIGH (ref 70–99)
Glucose-Capillary: 261 mg/dL — ABNORMAL HIGH (ref 70–99)

## 2019-09-16 LAB — MAGNESIUM: Magnesium: 1.9 mg/dL (ref 1.7–2.4)

## 2019-09-16 MED ORDER — PULMICORT FLEXHALER 90 MCG/ACT IN AEPB: 1.0000 | INHALATION_SPRAY | Freq: Two times a day (BID) | RESPIRATORY_TRACT | 1 refills | Status: DC

## 2019-09-16 MED ORDER — IPRATROPIUM-ALBUTEROL 0.5-2.5 (3) MG/3ML IN SOLN: 3.0000 mL | Freq: Two times a day (BID) | RESPIRATORY_TRACT | Status: DC

## 2019-09-16 MED ORDER — LEVETIRACETAM 500 MG PO TABS: 500.0000 mg | ORAL_TABLET | Freq: Two times a day (BID) | ORAL | 0 refills | Status: DC

## 2019-09-16 MED ORDER — ALBUTEROL SULFATE HFA 108 (90 BASE) MCG/ACT IN AERS: 2.0000 | INHALATION_SPRAY | Freq: Four times a day (QID) | RESPIRATORY_TRACT | 1 refills | Status: DC | PRN

## 2019-09-16 NOTE — Discharge Summary (Signed)
Physician Discharge Summary  Andrew Cobb [id number] DOB: 07-21-1950 DOA: 09/07/2019  PCP: Guadalupe Maple, MD  Admit date: 09/07/2019 Discharge date: 09/16/2019  Admitted From: Home Disposition: Home with home health  Recommendations for Outpatient Follow-up:  1. Follow up with PCP in 1-2 weeks 2. Please obtain BMP/CBC in one week your next doctors visit.  3. Keppra 500 mg twice daily 4. Follow-up patient neurology in 4 weeks 5. Recommended against driving, instructions have been given to him.  He is not allowed to drive at least until seen by outpatient neurology and cleared by them. 6. Supplemental oxygen for home has been arranged for 7. 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  MRI showed acute CVA along with chronic changes, neurology team was consulted.  There was also concerns of seizure therefore started on Keppra.  CT of the chest without contrast showed groundglass opacity concerns for pneumonitis/pneumonia.  Despite of several days of bronchodilator treatment, he still remained hypoxic without any evidence of fluid overload or infection.  Suspect is this aspiration pneumonitis causing his hypoxia therefore home oxygen has been arranged for.  Remains medically stable at this time.  His care has been extensively discussed by me with his wife.  Acute encephalopathy, persist Acute seizures, Acute CVA 31mm left parietal white matter per MRI but neurology thinks is chronic.  History of recurrent embolic infarcts/CVA with hemorrhagic conversion in July 2020 -Status post loading dose of Keppra, Keppra 500 mg twice  daily -EEG-negative for seizures but shows encephalopathy.  Continue seizure precaution -Per neurology team patient has chronic CVA.  Follow-up outpatient with neurology.  Recommends against driving, he has been instructed about this.  Wife knows about this as well.  Patient and wife "understands this. -Aspirin 81 mg, atorvastatin 40 mg daily -CT head-negative -UA-shows glucosuria but no evidence of infection  Bilateral multifocal infiltrate Aspiration pneumonitis/pneumonia in the setting of seizure -Ambulatory pulse ox still dropping down to 83% requiring 4 L nasal cannula.  Home oxygen arranged for.   No shortness of breath with ambulation at all.  Doing well with incentive spirometer. -COVID-19-negative  -Procalcitonin-negative, BNP Normal.  Discontinue Zosyn after today -Respiratory panel-negative -Echocardiogram March 2020-EF 60 to 65%, mild LVH -I-S//flutter.  Scheduled and as needed bronchodilators -CT chest consistent with bilateral infiltrates-aspiration? - aspiration precautions. -Seen by speech team status post MBS-recommending regular diet  Acute kidney injury, prerenal -Appears to have resolved.  Creatinine at baseline around 1.1  Coronary artery disease status post PCI -Currently chest pain-free.  Continue aspirin and statin  Essential hypertension -Benzapril on hold due to AKI  D M2, poorly controlled due to hyperglycemia -Home p.o. meds on hold, A1c 9.2 -Resume his home regimen  Hyperlipidemia -Statin  OSA -CPAP  PT-Home health-PT/OT/RN.     Discharge Diagnoses:  Principal Problem:   Altered mental status Active Problems:   OSA (obstructive sleep apnea)   Hypertension associated with diabetes ([place])   Uncontrolled type 2 diabetes mellitus with hyperglycemia, without long-term current use of insulin ([place])   Coronary artery disease   Hyperlipidemia associated with type 2 diabetes mellitus ([place])   History of CVA (cerebrovascular accident)    Acute encephalopathy  Bilateral pneumonia   Subjective: Patient feels great, he wants to go home today.  Denies any shortness of breath.  Does have still some hypoxia with ambulation very requiring 4 L nasal cannula.  Overall he feels great.  Discharge Exam: Vitals:   09/16/19 0743 09/16/19 0747  BP: 114/63   Pulse: 62   Resp: (!) 22   Temp: 97.7 F (36.5 C)   SpO2: 92% 92%   Vitals:   09/15/19 2054 09/15/19 2119 09/16/19 0743 09/16/19 0747  BP:  114/67 114/63   Pulse: 64 69 62   Resp: 18 18 (!) 22   Temp:  99 F (37.2 C) 97.7 F (36.5 C)   TempSrc:  Axillary    SpO2: 95% 95% 92% 92%  Weight:      Height:        General: Pt is alert, awake, not in acute distress, 3-4 L nasal cannula Cardiovascular: RRR, S1/S2 +, no rubs, no gallops Respiratory: Minimal bilateral rhonchi Abdominal: Soft, NT, ND, bowel sounds + Extremities: no edema, no cyanosis Patient is alert awake oriented X4.  He has good judgment and insight  Discharge Instructions   Allergies as of 09/16/2019      Reactions   Succinylsulphathiazole Rash   Sulfamethoxazole-trimethoprim Rash   Tetracyclines & Related Rash      Medication List    TAKE these medications   albuterol 108 (90 Base) MCG/ACT inhaler Commonly known as: VENTOLIN HFA Inhale 2 puffs into the lungs every 6 (six) hours as needed for wheezing or shortness of breath.   amitriptyline 25 MG tablet Commonly known as: ELAVIL Take 1 tablet (25 mg total) by mouth at bedtime.   aspirin 81 MG EC tablet Take 1 tablet (81 mg total) by mouth daily.   atorvastatin 40 MG tablet Commonly known as: LIPITOR Take 1 tablet (40 mg total) by mouth at bedtime.   benazepril 40 MG tablet Commonly known as: LOTENSIN Take 1 tablet (40 mg total) by mouth daily.   cetirizine 10 MG tablet Commonly known as: ZYRTEC Take 10 mg by mouth daily.   fluticasone 50 MCG/ACT nasal spray Commonly known as: FLONASE Place 1 spray into the nose 2 (two) times  daily as needed for congestion.   glucose blood test strip 1 each by Other route 2 (two) times daily. DX E11.9   Jardiance 10 MG Tabs tablet Generic drug: empagliflozin Take 10 mg by mouth daily before breakfast.   levETIRAcetam 500 MG tablet Commonly known as: KEPPRA Take 1 tablet (500 mg total) by mouth 2 (two) times daily.   Magnesium 250 MG Tabs Take 250 mg by mouth at bedtime.   metFORMIN 500 MG tablet Commonly known as: GLUCOPHAGE Take 2 tablets (1,000 mg total) by mouth 2 (two) times daily with a meal. What changed:   how much to take  additional instructions   MULTIVITAMIN PO Take 1 tablet by mouth daily.   onetouch ultrasoft lancets 1 each by Other route 2 (two) times daily. Dx E11.9   Pulmicort Flexhaler 90 MCG/ACT inhaler Generic drug: Budesonide Inhale 1-2 puffs into the lungs 2 (two) times daily.   valACYclovir 500 MG tablet Commonly known as: VALTREX Take 1 tablet (500 mg total) by mouth daily.   Vitamin D 50 MCG (2000 UT) Caps Take 2,000 Units by mouth daily.            Durable Medical Equipment  (From admission, onward)         Start  Ordered   09/15/19 1200  For home use only DME oxygen  Once    Question Answer Comment  Length of Need 6 Months   Mode or (Route) Nasal cannula   Liters per Minute 4   Frequency Continuous (stationary and portable oxygen unit needed)   Oxygen delivery system Gas      09/15/19 1159         Follow-up Information    [facility] Follow up.   Why: The home health agency will contact you for the first home visit. Contact information: [id number]       Guadalupe Maple, MD. Schedule an appointment as soon as possible for a visit in 1 week(s).   Specialty: Family Medicine Contact information: [place] [address] [phone]        [facility] Follow up in 4 week(s).   Why: Seizure Contact information: [address]     [place] [phone]         Allergies  Allergen Reactions  . Succinylsulphathiazole Rash  . Sulfamethoxazole-Trimethoprim Rash  . Tetracyclines & Related Rash    You were cared for by a hospitalist during your hospital stay. If you have any questions about your discharge medications or the care you received while you were in the hospital after you are discharged, you can call the unit and asked to speak with the hospitalist on call if the hospitalist that took care of you is not available. Once you are discharged, your primary care physician will handle any further medical issues. Please note that no refills for any discharge medications will be authorized once you are discharged, as it is imperative that you return to your primary care physician (or establish a relationship with a primary care physician if you do not have one) for your aftercare needs so that they can reassess your need for medications and monitor your lab values.   Procedures/Studies: DG Chest 2 View  Result Date: 09/08/2019 CLINICAL DATA:  Respiratory failure with hypoxia EXAM: CHEST - 2 VIEW COMPARISON:  10/09/2018 FINDINGS: Low lung volumes. Patchy bilateral airspace opacities have worsened since prior study. No effusions. Heart is upper limits normal in size. No acute bony abnormality. IMPRESSION: Patchy bilateral airspace disease concerning for pneumonia. Electronically Signed   By: Rolm Baptise M.D.   On: 09/08/2019 08:48   CT HEAD WO CONTRAST  Result Date: 09/07/2019 CLINICAL DATA:  Ataxia today.  Possible stroke. EXAM: CT HEAD WITHOUT CONTRAST TECHNIQUE: Contiguous axial images were obtained from the base of the skull through the vertex without intravenous contrast. COMPARISON:  Brain MRI 02/09/2019. FINDINGS: Brain: No evidence of acute infarction, hemorrhage, hydrocephalus, extra-axial collection or mass lesion/mass effect. Atrophy, extensive chronic microvascular ischemic change and remote  infarcts in the anterior and posterior watershed territories on the left again seen. Vascular: No hyperdense vessel or unexpected calcification. Skull: Intact.  No focal lesion. Sinuses/Orbits: Status post cataract surgery.  Otherwise negative. Other: None. IMPRESSION: No acute intracranial normality. Extensive chronic microvascular ischemic change remote infarcts on the left as above. Electronically Signed   By: Inge Rise M.D.   On: 09/07/2019 12:06   CT CHEST WO CONTRAST  Result Date: 09/10/2019 CLINICAL DATA:  Aspiration in the setting of seizure EXAM: CT CHEST WITHOUT CONTRAST TECHNIQUE: Multidetector CT imaging of the chest was performed following the standard protocol without IV contrast. COMPARISON:  Chest radiograph 09/08/2019 FINDINGS: Cardiovascular: Three-vessel coronary artery disease is noted.  Normal heart size. No pericardial effusion. Atherosclerotic plaque within the normal caliber aorta. Shared origin of the brachiocephalic and left common carotid artery with mild plaque in the proximal great vessels. Central pulmonary arteries are normal caliber. Luminal evaluation precluded in the absence of intravenous contrast media. Mediastinum/Nodes: No mediastinal fluid or gas. Normal thyroid gland and thoracic inlet. No acute abnormality of the trachea or esophagus. Posterior bowing of the trachea likely reflects imaging during exhalation. No worrisome mediastinal or axillary adenopathy. Hilar nodal evaluation is limited in the absence of intravenous contrast media. Lungs/Pleura: Widespread multifocal areas of ground-glass and mixed consolidation with a basilar and peripheral predominance. No pneumothorax or effusion. Evaluation for underlying pulmonary nodules is limited in the setting of extensive parenchymal disease. 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 Prior cholecystectomy noted as well. No acute abnormalities present in the visualized portions of the upper  abdomen. Musculoskeletal: Multilevel degenerative changes are present in the imaged portions of the spine. Prior cervical fusion without gross abnormality at this time. Left chest wall implantable loop recorder is noted. IMPRESSION: Widespread areas of mixed ground-glass and consolidative opacity. Findings could be seen in the setting of aspiration pneumonia/pneumonitis. However, please note such a pattern could also be seen with atypical viral infections including COVID-19 with a lack of fluid-filled airways at least warranting consideration of a differential. Aortic Atherosclerosis (ICD10-I70.0). Electronically Signed   By: Lovena Le M.D.   On: 09/10/2019 17:05   MR Brain Wo Contrast (neuro protocol)  Addendum Date: 09/07/2019   ADDENDUM REPORT: 09/07/2019 17:31 ADDENDUM: Please note diffusion weighted signal abnormality was present at the site of the 8 mm focus of restricted diffusion demonstrated on the current MRI. On the current exam, findings may reflect persistent restricted diffusion at site of a chronic infarct. However, a small focus of acute ischemia and acute infarction cannot be excluded. Addended findings discussed with Dr. Rory Percy by telephone at the 5:25 p.m. on 09/07/2019 at 5:29 pm. Electronically Signed   By: Kellie Simmering DO   On: 09/07/2019 17:31   Result Date: 09/07/2019 CLINICAL DATA:  Ataxia, stroke suspected. Additional history provided: Recent speech difficulty, history of stroke in August of last year. EXAM: MRI HEAD WITHOUT CONTRAST TECHNIQUE: Multiplanar, multiecho pulse sequences of the brain and surrounding structures were obtained without intravenous contrast. COMPARISON:  Head CT 09/07/2019, brain MRI 02/09/2019 FINDINGS: Brain: Multiple sequences are significantly motion degraded, limiting evaluation. Most notably, there is moderate/severe motion degradation of the sagittal T1 weighted sequence, moderate/severe motion degradation of the coronal T2 weighted sequence and moderate  motion degradation of the axial T1 weighted sequence. There is also mild-to-moderate d motion degradation of the axial diffusion-weighted sequence. There is an 8 mm focus of restricted diffusion consistent with acute infarction within the left parietal white matter (series 2, image 32). Redemonstrated chronic cortically based infarcts within the left frontoparietal operculum and left temporoparietal junction as well as portions of the left occipital lobe. Additional small chronic cortically based infarct within the anterolateral left frontal lobe. As before, there is chronic hemosiderin deposition associated with infarcts in the anterior left frontal lobe and left temporoparietal junction. Redemonstrated small chronic lacunar infarct within the right lentiform nucleus. A small chronic lacunar infarct within the left lentiform nucleus was not present on prior MRI. Background advanced chronic small vessel ischemic disease is unchanged. Stable, mild generalized parenchymal atrophy. Vascular: Flow voids maintained within the proximal large arterial vessels. Skull and upper cervical spine: No focal marrow lesion is identified  within the limitations of motion degradation. Sinuses/Orbits: Visualized orbits demonstrate no acute abnormality. Mild ethmoid sinus mucosal thickening. No significant mastoid effusion. IMPRESSION: 1. Significantly motion degraded examination as described. 2. 8 mm acute infarct within the left parietal white matter. 3. Redemonstration of multiple chronic cortically based infarcts within the left cerebral hemisphere. 4. A chronic lacunar infarct within the left lentiform nucleus is new from MRI 02/09/2019. Chronic right lentiform nucleus lacunar infarct and background advanced chronic small vessel ischemic disease, unchanged. 5. Stable, mild generalized parenchymal atrophy. Electronically Signed: By: Kellie Simmering DO On: 09/07/2019 16:16   MR PROSTATE W WO CONTRAST  Result Date:  08/25/2019 CLINICAL DATA:  Rising PSA levels. EXAM: MR PROSTATE WITHOUT AND WITH CONTRAST TECHNIQUE: Multiplanar multisequence MRI images were obtained of the pelvis centered about the prostate. Pre and post contrast images were obtained. CONTRAST:  49mL GADAVIST GADOBUTROL 1 MMOL/ML IV SOLN COMPARISON:  None. FINDINGS: Prostate: -- Peripheral Zone: An ill-defined nodule is seen in the left anterior apex which involves both the anterior fibromuscular stroma and anterior transition zone. This nodule measures 21 by 18 mm on image 67/12. This nodule shows marked ADC hypointensity, marked DWI hyperintensity, and marked early contrast enhancement. There is also extracapsular extension in the left anterior apex. This is consistent with high-grade carcinoma. -- Transition/Central Zone: Circumscribed BPH nodules are noted, but no suspicious nodules with obscured or non-circumscribed margins seen. -- Measurements/Volume:  5.4 x 5.0 x 5.9 cm (volume = 83 cm^3) Transcapsular spread: Extracapsular extension is seen in the left anterior apex at site of nodule described above. Seminal vesicle involvement:  Absent Neurovascular bundle involvement:  Absent Pelvic adenopathy: None visualized Bone metastasis: None visualized Other:  Sigmoid diverticulosis, without evidence of diverticulitis. IMPRESSION: 21 cm nodule in the left anterior apex, which shows extracapsular extension. PI-RADS 5: Very high (clinically significant cancer is highly likely to be present). No evidence of neurovascular bundle involvement or other pelvic metastatic disease. (I have processed this exam in the DynaCAD application to allow for MR/US fusion-guided biopsy if performed.) Electronically Signed   By: Marlaine Hind M.D.   On: 08/25/2019 13:53   DG Swallowing Func-Speech Pathology  Result Date: 09/14/2019 Objective Swallowing Evaluation: Type of Study: MBS-Modified Barium Swallow Study  Patient Details Name: AVIN BONTON MRN: [id number] Date of Birth:  June 15, 1951 Today's Date: 09/14/2019 Time: SLP Start Time (ACUTE ONLY): 1310 -SLP Stop Time (ACUTE ONLY): 1324 SLP Time Calculation (min) (ACUTE ONLY): 14 min Past Medical History: Past Medical History: Diagnosis Date . Arthritis  . Asthma  . Cancer [facility])   prostate . Chronic venous insufficiency   varicose vein lower extremity with inflammation . Coronary artery disease 1996  two stents placed  . Diabetes mellitus without complication ([place])   type 2 on metformin . GERD (gastroesophageal reflux disease)   no issues since gastric bypass surgery as stated per pt . Hyperlipidemia  . Hypogonadism in male  . MRSA (methicillin resistant Staphylococcus aureus) infection   07/30/2008 thru 08/07/2008 . Sleep apnea   on BIPAP . Stented coronary artery  . Stroke ([place])  . Thrombocythemia [facility])  Past Surgical History: Past Surgical History: Procedure Laterality Date . ANGIOPLASTY   . ANGIOPLASTY    with stent 04/07/1995 . ANTERIOR CERVICAL DECOMPRESSION/DISCECTOMY FUSION 4 LEVELS N/A 08/17/2017  Procedure: Anterior discectomy with fusion and plate fixation Cervical Three-Four, Four-Five, Five-Six, and Six-Seven Fusion;  Surgeon: Ditty, Kevan Ny, MD;  Location: [place];  Service: Neurosurgery;  Laterality: N/A;  Anterior discectomy  with fusion and plate fixation Cervical Three-Four, Four-Five, Five-Six, and Six-Seven Fusion  . APPENDECTOMY    1966 . BUBBLE STUDY  09/20/2018  Procedure: BUBBLE STUDY;  Surgeon: Josue Hector, MD;  Location: [place];  Service: Cardiovascular;; . [place] . CARDIOVASCULAR STRESS TEST    07/31/2011 . CARPAL TUNNEL RELEASE Left  . CHOLECYSTECTOMY    2006 . colonscopy     08/25/2012 . EYE SURGERY Bilateral   cataract . FUNCTIONAL ENDOSCOPIC SINUS SURGERY    11/10/2013 . GASTRIC BYPASS    10/05/2012 . HERNIA REPAIR    left inguinal 1981 . INCISION AND DRAINAGE ABSCESS Right 02/26/2017  Procedure: INCISION AND DRAINAGE ABSCESS;  Surgeon: Nickie Retort, MD;  Location: [facility];   Service: Urology;  Laterality: Right; . JOINT REPLACEMENT    bilateral . left ankle surgery     05/03/2003  . left carpel tunnel     09/18/1993 . left knee meniscal tear     01/25/2010 . left knee meniscal tear repair     05/04/1996 . left rotator cuff repair     05/03/2003  . LOOP RECORDER INSERTION N/A 09/20/2018  Procedure: LOOP RECORDER INSERTION;  Surgeon: Evans Lance, MD;  Location: [place] [facility];  Service: Cardiovascular;  Laterality: N/A; . REPLACEMENT TOTAL KNEE BILATERAL  07/13/2015 . right ankle surgery     fracture has 2 screws 07/07/1997 . right carpel tunnel     05/16/1992 . right shoulder replacement     01/27/2006 . SCROTAL EXPLORATION Right 02/26/2017  Procedure: SCROTUM EXPLORATION;  Surgeon: Nickie Retort, MD;  Location: [facility];  Service: Urology;  Laterality: Right; . TEE WITHOUT CARDIOVERSION N/A 09/20/2018  Procedure: TRANSESOPHAGEAL ECHOCARDIOGRAM (TEE);  Surgeon: Josue Hector, MD;  Location: [facility];  Service: Cardiovascular;  Laterality: N/A; . TOTAL KNEE ARTHROPLASTY Left 07/13/2015  Procedure: LEFT TOTAL KNEE ARTHROPLASTY;  Surgeon: Gaynelle Arabian, MD;  Location: [facility];  Service: Orthopedics;  Laterality: Left; HPI: Pt is a 69 yo male admitted secondary to AMS and concerns of seizure. [name] MRI revealed a questionable acute infarct in the left parietal white matter versus persistent restricted diffusion at the site of a chronic infarct is noted. PMH includes CVA, CAD s/p PCI, DM, HTN, OSA on CPAP, and prostate cancer. Chest CT widespread areas of mixed ground-glass and consolidative opacity. Findings could be seen in the setting of aspiration pneumonia/pneumonitis. Spoke with MD who is conerned with a baseline dysphagia causing pna versus aspiration during/after seizure.  Subjective: Pt seated at EOB. Wife present Assessment / Plan / Recommendation CHL IP CLINICAL IMPRESSIONS 09/13/2019 Clinical Impression Pt exhibits oropharyngeal dysphagia with aspiration of thin barium  consistency. There was reduced oral control resulting in barium spilling diffusely under tongue and lingual residue post swallow. The strength of his swallow is within normal range although timing of epiglottic deflection is late and thin barium via straw entered laryngeal vestibule with trace aspiration not sensed. On one ocassion pt extended head posteriorally in combination with reduced oral control leading to penetration onto vocal cords. When small sips thin from cup used with head in neutral there was no penetration or aspiration consistently over subsequent trials. He orally propelled pill and thin liquid entered vestibule but exited during the swallow. Pt coughed hard once although no barium seen in vestibule at that time. His respirations remained stable throughout. Recommend pt continue regular texture/thin liquids, NO straws, small sips in upright position. Take breaks if dyspneic with meal. ST will  continue to follow for safety.   SLP Visit Diagnosis Dysphagia, oropharyngeal phase (R13.12) Attention and concentration deficit following -- Frontal lobe and executive function deficit following -- Impact on safety and function Moderate aspiration risk   CHL IP TREATMENT RECOMMENDATION 09/13/2019 Treatment Recommendations Therapy as outlined in treatment plan below   Prognosis 09/13/2019 Prognosis for Safe Diet Advancement Good Barriers to Reach Goals -- Barriers/Prognosis Comment -- CHL IP DIET RECOMMENDATION 09/13/2019 SLP Diet Recommendations Regular solids;Thin liquid Liquid Administration via Cup;No straw Medication Administration Whole meds with puree Compensations -- Postural Changes --   CHL IP OTHER RECOMMENDATIONS 09/13/2019 Recommended Consults -- Oral Care Recommendations Oral care BID Other Recommendations --   CHL IP FOLLOW UP RECOMMENDATIONS 09/13/2019 Follow up Recommendations Other (comment)   CHL IP FREQUENCY AND DURATION 09/13/2019 Speech Therapy Frequency (ACUTE ONLY) min 2x/week Treatment Duration 2  weeks      CHL IP ORAL PHASE 09/13/2019 Oral Phase Impaired Oral - Pudding Teaspoon -- Oral - Pudding Cup -- Oral - Honey Teaspoon -- Oral - Honey Cup -- Oral - Nectar Teaspoon -- Oral - Nectar Cup -- Oral - Nectar Straw -- Oral - Thin Teaspoon -- Oral - Thin Cup Incomplete tongue to palate contact;Lingual/palatal residue;Decreased bolus cohesion Oral - Thin Straw Lingual/palatal residue;Decreased bolus cohesion Oral - Puree -- Oral - Mech Soft -- Oral - Regular Lingual/palatal residue Oral - Multi-Consistency -- Oral - Pill WFL Oral Phase - Comment --  CHL IP PHARYNGEAL PHASE 09/13/2019 Pharyngeal Phase Impaired Pharyngeal- Pudding Teaspoon -- Pharyngeal -- Pharyngeal- Pudding Cup -- Pharyngeal -- Pharyngeal- Honey Teaspoon -- Pharyngeal -- Pharyngeal- Honey Cup -- Pharyngeal -- Pharyngeal- Nectar Teaspoon -- Pharyngeal -- Pharyngeal- Nectar Cup -- Pharyngeal -- Pharyngeal- Nectar Straw -- Pharyngeal -- Pharyngeal- Thin Teaspoon -- Pharyngeal -- Pharyngeal- Thin Cup Penetration/Aspiration during swallow;Pharyngeal residue - valleculae Pharyngeal Material enters airway, CONTACTS cords and not ejected out Pharyngeal- Thin Straw Penetration/Aspiration during swallow Pharyngeal Material enters airway, passes BELOW cords without attempt by patient to eject out (silent aspiration) Pharyngeal- Puree -- Pharyngeal -- Pharyngeal- Mechanical Soft -- Pharyngeal -- Pharyngeal- Regular WFL Pharyngeal -- Pharyngeal- Multi-consistency -- Pharyngeal -- Pharyngeal- Pill WFL Pharyngeal -- Pharyngeal Comment --  CHL IP CERVICAL ESOPHAGEAL PHASE 09/13/2019 Cervical Esophageal Phase WFL Pudding Teaspoon -- Pudding Cup -- Honey Teaspoon -- Honey Cup -- Nectar Teaspoon -- Nectar Cup -- Nectar Straw -- Thin Teaspoon -- Thin Cup -- Thin Straw -- Puree -- Mechanical Soft -- Regular -- Multi-consistency -- Pill -- Cervical Esophageal Comment -- [place] Siren 09/14/2019, 8:49 AM              EEG adult  Result Date: 09/08/2019 Lora Havens, MD     09/08/2019 10:26 AM Patient Name: Andrew Cobb MRN: [id number] Epilepsy Attending: Lora Havens Referring Physician/Provider: Dr Amie [place] Date: 09/07/2019 Duration: 22.45 mins Patient history: 69 year old man with 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 EEG to evaluate for seizure. Level of alertness: awake AEDs during EEG study: LEV Technical aspects: This EEG study was done with scalp electrodes positioned according to the 10-20 International system of electrode placement. Electrical activity was acquired at a sampling rate of 500Hz  and reviewed with a high frequency filter of 70Hz  and a low frequency filter of 1Hz . EEG data were recorded continuously and digitally stored. DESCRIPTION: The posterior dominant rhythm consists of 8.5Hz  activity  of moderate voltage (25-35 uV) seen predominantly in posterior head regions, symmetric and reactive to eye opening and eye closing. Intermittent generalized, maximal left posterior temporal region3-5hz  theta-delta slowing was seen.  Hyperventilation and photic stimulation were not performed. ABNORMALITY - Intermittent slow, generalized left posterior temporal region IMPRESSION: This study showed evidence of cortical dysfunction in left posterior temporal region likely secondary to underlying stroke as well as mild diffuse encephalopathy. No seizures or definite epileptiform discharges were seen throughout the recording. Lora Havens   CUP PACEART REMOTE DEVICE CHECK  Result Date: 08/29/2019 Carelink summary report received. Battery status OK. Normal device function. No new symptom episodes, tachy episodes, brady, or pause episodes. No new AF episodes. Monthly summary reports and ROV/PRN     The results of significant diagnostics from this hospitalization (including imaging, microbiology, ancillary and  laboratory) are listed below for reference.     Microbiology: Recent Results (from the past 240 hour(s))  Respiratory Panel by PCR     Status: None   Collection Time: 09/08/19  2:57 PM   Specimen: Nasopharyngeal Swab; Respiratory  Result Value Ref Range Status   Adenovirus NOT DETECTED NOT DETECTED Final   Coronavirus 229E NOT DETECTED NOT DETECTED Final    Comment: (NOTE) The Coronavirus on the Respiratory Panel, DOES NOT test for the novel  Coronavirus (2019 nCoV)    Coronavirus HKU1 NOT DETECTED NOT DETECTED Final   Coronavirus NL63 NOT DETECTED NOT DETECTED Final   Coronavirus OC43 NOT DETECTED NOT DETECTED Final   Metapneumovirus NOT DETECTED NOT DETECTED Final   Rhinovirus / Enterovirus NOT DETECTED NOT DETECTED Final   Influenza A NOT DETECTED NOT DETECTED Final   Influenza B NOT DETECTED NOT DETECTED Final   Parainfluenza Virus 1 NOT DETECTED NOT DETECTED Final   Parainfluenza Virus 2 NOT DETECTED NOT DETECTED Final   Parainfluenza Virus 3 NOT DETECTED NOT DETECTED Final   Parainfluenza Virus 4 NOT DETECTED NOT DETECTED Final   Respiratory Syncytial Virus NOT DETECTED NOT DETECTED Final   Bordetella pertussis NOT DETECTED NOT DETECTED Final   Chlamydophila pneumoniae NOT DETECTED NOT DETECTED Final   Mycoplasma pneumoniae NOT DETECTED NOT DETECTED Final    Comment: Performed at [facility], [address], [place], [place] [address]  MRSA PCR Screening     Status: Abnormal   Collection Time: 09/08/19 11:24 PM   Specimen: Nasopharyngeal  Result Value Ref Range Status   MRSA by PCR POSITIVE (A) NEGATIVE Final    Comment:        The GeneXpert MRSA Assay (FDA approved for NASAL specimens only), is one component of a comprehensive MRSA colonization surveillance program. It is not intended to diagnose MRSA infection nor to guide or monitor treatment for MRSA infections. RESULT CALLED TO, READ BACK BY AND VERIFIED WITH: A ROSS RN 09/09/19 0059 JDW Performed at  [facility], [place] [address] [place],  [address]      Labs: BNP (last 3 results) Recent Labs    09/09/19 0814 09/13/19 0052  BNP 22.5 [id number]   Basic Metabolic Panel: Recent Labs  Lab 09/12/19 0408 09/13/19 0052 09/14/19 0219 09/15/19 0228 09/16/19 0337  NA 135 135 135 132* 134*  K 3.7 3.7 3.7 3.5 4.2  CL 99 100 98 96* 96*  CO2 24 25 26 25 25   GLUCOSE 193* 206* 168* 157* 159*  BUN 20 18 22 21 21   CREATININE 1.11 1.02 1.06 1.06 0.99  CALCIUM 8.0* 8.3* 8.6* 8.3* 8.2*  MG 2.0 2.1 2.2 1.9 1.9   Liver Function Tests: Recent Labs  Lab 09/10/19 0231 09/11/19 0144 09/12/19 0408  AST 54* 49* 55*  ALT 38 36 41  ALKPHOS 51 50 51  BILITOT 2.1* 2.3* 1.8*  PROT 5.5* 5.6* 5.6*  ALBUMIN 2.6* 2.5* 2.2*   No results for input(s): LIPASE, AMYLASE in the last 168 hours. No results for input(s): AMMONIA in the last 168 hours. CBC: Recent Labs  Lab 09/12/19 0510 09/13/19 0052 09/14/19 0219 09/15/19 0228 09/16/19 0337  WBC 5.1 4.8 5.2 4.4 4.4  HGB 12.0* 12.0* 12.7* 11.4* 12.3*  HCT 36.5* 36.8* 38.9* 35.1* 36.9*  MCV 91.7 92.2 93.5 92.9 93.7  PLT 212 236 250 221 218   Cardiac Enzymes: No results for input(s): CKTOTAL, CKMB, CKMBINDEX, TROPONINI in the last 168 hours. BNP: Invalid input(s): POCBNP CBG: Recent Labs  Lab 09/14/19 2102 09/15/19 0722 09/15/19 1152 09/15/19 2054 09/16/19 0711  GLUCAP 187* 123* 194* 174* 142*   D-Dimer No results for input(s): DDIMER in the last 72 hours. Hgb A1c No results for input(s): HGBA1C in the last 72 hours. Lipid Profile No results for input(s): CHOL, HDL, LDLCALC, TRIG, CHOLHDL, LDLDIRECT in the last 72 hours. Thyroid function studies No results for input(s): TSH, T4TOTAL, T3FREE, THYROIDAB in the last 72 hours.  Invalid input(s): FREET3 Anemia work up No results for input(s): VITAMINB12, FOLATE, FERRITIN, TIBC, IRON, RETICCTPCT in the last 72 hours. Urinalysis    Component Value Date/Time   COLORURINE  YELLOW 09/07/2019 1135   APPEARANCEUR CLEAR 09/07/2019 1135   APPEARANCEUR Clear 07/21/2017 0843   LABSPEC 1.028 09/07/2019 1135   PHURINE 5.0 09/07/2019 1135   GLUCOSEU >=500 (A) 09/07/2019 1135   HGBUR NEGATIVE 09/07/2019 1135   BILIRUBINUR NEGATIVE 09/07/2019 1135   BILIRUBINUR Negative 07/21/2017 0843   KETONESUR 80 (A) 09/07/2019 1135   PROTEINUR NEGATIVE 09/07/2019 1135   UROBILINOGEN 0.2 07/11/2012 1443   NITRITE NEGATIVE 09/07/2019 1135   LEUKOCYTESUR NEGATIVE 09/07/2019 1135   Sepsis Labs Invalid input(s): PROCALCITONIN,  WBC,  LACTICIDVEN Microbiology Recent Results (from the past 240 hour(s))  Respiratory Panel by PCR     Status: None   Collection Time: 09/08/19  2:57 PM   Specimen: Nasopharyngeal Swab; Respiratory  Result Value Ref Range Status   Adenovirus NOT DETECTED NOT DETECTED Final   Coronavirus 229E NOT DETECTED NOT DETECTED Final    Comment: (NOTE) The Coronavirus on the Respiratory Panel, DOES NOT test for the novel  Coronavirus (2019 nCoV)    Coronavirus HKU1 NOT DETECTED NOT DETECTED Final   Coronavirus NL63 NOT DETECTED NOT DETECTED Final   Coronavirus OC43 NOT DETECTED NOT DETECTED Final   Metapneumovirus NOT DETECTED NOT DETECTED Final   Rhinovirus / Enterovirus NOT DETECTED NOT DETECTED Final   Influenza A NOT DETECTED NOT DETECTED Final   Influenza B NOT DETECTED NOT DETECTED Final   Parainfluenza Virus 1 NOT DETECTED NOT DETECTED Final   Parainfluenza Virus 2 NOT DETECTED NOT DETECTED Final   Parainfluenza Virus 3 NOT DETECTED NOT DETECTED Final   Parainfluenza Virus 4 NOT DETECTED NOT DETECTED Final   Respiratory Syncytial Virus NOT DETECTED NOT DETECTED Final   Bordetella pertussis NOT DETECTED NOT DETECTED Final   Chlamydophila pneumoniae NOT DETECTED NOT DETECTED Final   Mycoplasma pneumoniae NOT DETECTED NOT DETECTED Final    Comment: Performed at [facility], [place]. [address] [place], [place] [address]  MRSA PCR Screening      Status: Abnormal  Collection Time: 09/08/19 11:24 PM   Specimen: Nasopharyngeal  Result Value Ref Range Status   MRSA by PCR POSITIVE (A) NEGATIVE Final    Comment:        The GeneXpert MRSA Assay (FDA approved for NASAL specimens only), is one component of a comprehensive MRSA colonization surveillance program. It is not intended to diagnose MRSA infection nor to guide or monitor treatment for MRSA infections. RESULT CALLED TO, READ BACK BY AND VERIFIED WITH: A ROSS RN 09/09/19 0059 JDW Performed at [facility], [place] [address] [place], [place] [address]      Time coordinating discharge:  I have spent 35 minutes face to face with the patient and on the ward discussing the patients care, assessment, plan and disposition with other care givers. >50% of the time was devoted counseling the patient about the risks and benefits of treatment/Discharge disposition and coordinating care.   SIGNED:   Damita Lack, MD  [facility] 09/16/2019, 10:41 AM   If 7PM-7AM, please contact night-coverage

## 2019-09-16 NOTE — Discharge Instructions (Signed)
Seizure, Adult °A seizure is a sudden burst of abnormal electrical activity in the brain. Seizures usually last from 30 seconds to 2 minutes. They can cause many different symptoms. °Usually, seizures are not harmful unless they last a long time. °What are the causes? °Common causes of this condition include: °· Fever or infection. °· Conditions that affect the brain, such as: °? A brain abnormality that you were born with. °? A brain or head injury. °? Bleeding in the brain. °? A tumor. °? Stroke. °? Brain disorders such as autism or cerebral palsy. °· Low blood sugar. °· Conditions that are passed from parent to child (are inherited). °· Problems with substances, such as: °? Having a reaction to a drug or a medicine. °? Suddenly stopping the use of a substance (withdrawal). °In some cases, the cause may not be known. A person who has repeated seizures over time without a clear cause has a condition called epilepsy. °What increases the risk? °You are more likely to get this condition if you have: °· A family history of epilepsy. °· Had a seizure in the past. °· A brain disorder. °· A history of head injury, lack of oxygen at birth, or strokes. °What are the signs or symptoms? °There are many types of seizures. The symptoms vary depending on the type of seizure you have. Examples of symptoms during a seizure include: °· Shaking (convulsions). °· Stiffness in the body. °· Passing out (losing consciousness). °· Head nodding. °· Staring. °· Not responding to sound or touch. °· Loss of bladder control and bowel control. °Some people have symptoms right before and right after a seizure happens. °Symptoms before a seizure may include: °· Fear. °· Worry (anxiety). °· Feeling like you may vomit (nauseous). °· Feeling like the room is spinning (vertigo). °· Feeling like you saw or heard something before (déjà vu). °· Odd tastes or smells. °· Changes in how you see. You may see flashing lights or spots. °Symptoms after a  seizure happens can include: °· Confusion. °· Sleepiness. °· Headache. °· Weakness on one side of the body. °How is this treated? °Most seizures will stop on their own in under 5 minutes. In these cases, no treatment is needed. Seizures that last longer than 5 minutes will usually need treatment. Treatment can include: °· Medicines given through an IV tube. °· Avoiding things that are known to cause your seizures. These can include medicines that you take for another condition. °· Medicines to treat epilepsy. °· Surgery to stop the seizures. This may be needed if medicines do not help. °Follow these instructions at home: °Medicines °· Take over-the-counter and prescription medicines only as told by your doctor. °· Do not eat or drink anything that may keep your medicine from working, such as alcohol. °Activity °· Do not do any activities that would be dangerous if you had another seizure, like driving or swimming. Wait until your doctor says it is safe for you to do them. °· If you live in the [place] ask your local DMV (department of motor vehicles) when you can drive. °· Get plenty of rest. °Teaching others °Teach friends and family what to do when you have a seizure. They should: °· Lay you on the ground. °· Protect your head and body. °· Loosen any tight clothing around your neck. °· Turn you on your side. °· Not hold you down. °· Not put anything into your mouth. °· Know whether or not you need emergency care. °· Stay   with you until you are better.  General instructions  Contact your doctor each time you have a seizure.  Avoid anything that gives you seizures.  Keep a seizure diary. Write down: ? What you think caused each seizure. ? What you remember about each seizure.  Keep all follow-up visits as told by your doctor. This is important. Contact a doctor if:  You have another seizure.  You have seizures more often.  There is any change in what happens during your seizures.  You keep having  seizures with treatment.  You have symptoms of being sick or having an infection. Get help right away if:  You have a seizure that: ? Lasts longer than 5 minutes. ? Is different than seizures you had before. ? Makes it harder to breathe. ? Happens after you hurt your head.  You have any of these symptoms after a seizure: ? Not being able to speak. ? Not being able to use a part of your body. ? Confusion. ? A bad headache.  You have two or more seizures in a row.  You do not wake up right after a seizure.  You get hurt during a seizure. These symptoms may be an emergency. Do not wait to see if the symptoms will go away. Get medical help right away. Call your local emergency services (911 in the [place] Do not drive yourself to the hospital. Summary  Seizures usually last from 30 seconds to 2 minutes. Usually, they are not harmful unless they last a long time.  Do not eat or drink anything that may keep your medicine from working, such as alcohol.  Teach friends and family what to do when you have a seizure.  Contact your doctor each time you have a seizure. This information is not intended to replace advice given to you by your health care provider. Make sure you discuss any questions you have with your health care provider. Document Revised: 09/10/2018 Document Reviewed: 09/10/2018 Elsevier Patient Education  [place] in detail with him.  He verbalized complete understanding.  Per [facility] statutes, patients with seizures are not allowed to drive until they have been seizure-free for six months.   Use caution when using heavy equipment or power tools. Avoid working on ladders or at heights. Take showers instead of baths. Ensure the water temperature is not too high on the home water heater. Do not go swimming alone. Do not lock yourself in a room alone (i.e. bathroom). When caring for infants or small children, sit down  when holding, feeding, or changing them to minimize risk of injury to the child in the event you have a seizure. Maintain good sleep hygiene. Avoid alcohol.   If patienthas another seizure, call 911 and bring them back to the ED if: A. The seizure lasts longer than 5 minutes.  B. The patient doesn't wake shortly after the seizure or has new problems such as difficulty seeing, speaking or moving following the seizure C. The patient was injured during the seizure D. The patient has a temperature over 102 F (39C) E. The patient vomited during the seizure and now is having trouble breathing   You were cared for by a hospitalist during your hospital stay. If you have any questions about your discharge medications or the care you received while you were in the hospital after you are discharged, you can call the unit and asked to speak with the hospitalist on call  if the hospitalist that took care of you is not available. Once you are discharged, your primary care physician will handle any further medical issues. Please note that NO REFILLS for any discharge medications will be authorized once you are discharged, as it is imperative that you return to your primary care physician (or establish a relationship with a primary care physician if you do not have one) for your aftercare needs so that they can reassess your need for medications and monitor your lab values.  Please request your Prim.MD to go over all Hospital Tests and Procedure/Radiological results at the follow up, please get all Hospital records sent to your Prim MD by signing hospital release before you go home.  Get CBC, CMP, 2 view Chest X ray checked  by Primary MD during your next visit or SNF MD in 5-7 days ( we routinely change or add medications that can affect your baseline labs and fluid status, therefore we recommend that you get the mentioned basic workup next visit with your PCP, your PCP may decide not to get them or add new  tests based on their clinical decision)  On your next visit with your primary care physician please Get Medicines reviewed and adjusted.  If you experience worsening of your admission symptoms, develop shortness of breath, life threatening emergency, suicidal or homicidal thoughts you must seek medical attention immediately by calling 911 or calling your MD immediately  if symptoms less severe.  You Must read complete instructions/literature along with all the possible adverse reactions/side effects for all the Medicines you take and that have been prescribed to you. Take any new Medicines after you have completely understood and accpet all the possible adverse reactions/side effects.   Do not drive, operate heavy machinery, perform activities at heights, swimming or participation in water activities or provide baby sitting services if your were admitted for syncope or siezures until you have seen by Primary MD or a Neurologist and advised to do so again.  Do not drive when taking Pain medications.

## 2019-09-16 NOTE — TOC Transition Note (Signed)
Transition of Care [facility]) - CM/SW Discharge Note   Patient Details  Name: Andrew Cobb MRN: [id number] Date of Birth: Jun 23, 1951  Transition of Care [facility]) CM/SW Contact:  Pollie Friar, RN Phone Number: 09/16/2019, 11:13 AM   Clinical Narrative:    Pt discharging home with [facility] through [facility]. Britney with [facility] aware of d/c home today.  Pt discharging with oxygen. Oxygen delivered to the room for transport and will be delivered to the home after 4 pm.  Pt has supervision at home and transportation to home.    Final next level of care: Home w Home Health Services Barriers to Discharge: No Barriers Identified   Patient Goals and CMS Choice   CMS Medicare.gov Compare Post Acute Care list provided to:: Patient Choice offered to / list presented to : Patient  Discharge Placement                       Discharge Plan and Services   Discharge Planning Services: CM Consult Post Acute Care Choice: Home Health          DME Arranged: Oxygen DME Agency: [employer] Date DME Agency Contacted: 09/15/19   Representative spoke with at DME Agency: [place]: PT, OT, RN [place] Agency: [place] Date [place]: 09/16/19   Representative spoke with at [place]: [place] ([place]) Interventions     Readmission Risk Interventions No flowsheet data found.

## 2019-09-16 NOTE — Plan of Care (Signed)
  Problem: Activity: Goal: Risk for activity intolerance will decrease Outcome: Progressing   

## 2019-09-19 ENCOUNTER — Ambulatory Visit (INDEPENDENT_AMBULATORY_CARE_PROVIDER_SITE_OTHER): Payer: PPO | Admitting: Nurse Practitioner

## 2019-09-19 ENCOUNTER — Encounter: Payer: Self-pay | Admitting: Nurse Practitioner

## 2019-09-19 ENCOUNTER — Other Ambulatory Visit: Payer: Self-pay

## 2019-09-19 VITALS — BP 106/71 | HR 70 | Temp 98.0°F | Ht 70.0 in | Wt 190.0 lb

## 2019-09-19 DIAGNOSIS — G9340 Encephalopathy, unspecified: Secondary | ICD-10-CM

## 2019-09-19 DIAGNOSIS — Z8546 Personal history of malignant neoplasm of prostate: Secondary | ICD-10-CM | POA: Diagnosis not present

## 2019-09-19 DIAGNOSIS — E785 Hyperlipidemia, unspecified: Secondary | ICD-10-CM | POA: Diagnosis not present

## 2019-09-19 DIAGNOSIS — Z8673 Personal history of transient ischemic attack (TIA), and cerebral infarction without residual deficits: Secondary | ICD-10-CM | POA: Diagnosis not present

## 2019-09-19 DIAGNOSIS — E1159 Type 2 diabetes mellitus with other circulatory complications: Secondary | ICD-10-CM | POA: Diagnosis not present

## 2019-09-19 DIAGNOSIS — J189 Pneumonia, unspecified organism: Secondary | ICD-10-CM | POA: Diagnosis not present

## 2019-09-19 DIAGNOSIS — E1165 Type 2 diabetes mellitus with hyperglycemia: Secondary | ICD-10-CM | POA: Diagnosis not present

## 2019-09-19 DIAGNOSIS — J4520 Mild intermittent asthma, uncomplicated: Secondary | ICD-10-CM

## 2019-09-19 DIAGNOSIS — E559 Vitamin D deficiency, unspecified: Secondary | ICD-10-CM

## 2019-09-19 DIAGNOSIS — I10 Essential (primary) hypertension: Secondary | ICD-10-CM | POA: Diagnosis not present

## 2019-09-19 DIAGNOSIS — E1169 Type 2 diabetes mellitus with other specified complication: Secondary | ICD-10-CM

## 2019-09-19 LAB — MICROALBUMIN, URINE WAIVED
Creatinine, Urine Waived: 50 mg/dL (ref 10–300)
Microalb, Ur Waived: 30 mg/L — ABNORMAL HIGH (ref 0–19)

## 2019-09-19 MED ORDER — METFORMIN HCL 500 MG PO TABS: 1000.0000 mg | ORAL_TABLET | Freq: Two times a day (BID) | ORAL | 2 refills | Status: DC

## 2019-09-19 MED ORDER — ALBUTEROL SULFATE (2.5 MG/3ML) 0.083% IN NEBU: 2.5000 mg | INHALATION_SOLUTION | Freq: Four times a day (QID) | RESPIRATORY_TRACT | 1 refills | Status: DC | PRN

## 2019-09-19 MED ORDER — BUDESONIDE-FORMOTEROL FUMARATE 160-4.5 MCG/ACT IN AERO: 2.0000 | INHALATION_SPRAY | Freq: Two times a day (BID) | RESPIRATORY_TRACT | 3 refills | Status: DC

## 2019-09-19 NOTE — Assessment & Plan Note (Signed)
Continue Keppra and collaboration with neurology.

## 2019-09-19 NOTE — Assessment & Plan Note (Signed)
Chronic, ongoing.  Continue daily supplement and check Vit D level today. ?

## 2019-09-19 NOTE — Assessment & Plan Note (Addendum)
Chronic, ongoing with BP below neuro goal <130/90.  Continue current medication regimen and adjust as needed.  CMP ordered today, but they were unable to obtain.  Recommend he monitor BP at home daily and document.

## 2019-09-19 NOTE — Assessment & Plan Note (Addendum)
Chronic, ongoing with A1C today 9.2%, upward trend + last urine ALB 10 and A:C <30.  Not at neuro goal of <6.5%.  Increase Metformin to 1000 MG BID and continue Jardiance 10 MG, will increase this if ongoing high BS at home.  Recommend he check BS twice daily.  Return in 4 weeks.

## 2019-09-19 NOTE — Assessment & Plan Note (Signed)
Chronic, ongoing.  They were unable to afford Pulmicort, will return to Symbicort which he has used previously and is more cost effective for them.  Script sent.  Referral to pulmonary due to recent initiation of O2 and ongoing SOB after PNA.

## 2019-09-19 NOTE — Assessment & Plan Note (Signed)
Chronic, ongoing.  Continue ASA and maintaining tight lipid control, may need to increase Atorvastatin to 80 MG.  Lipid panel outpatient, as unable to obtain today.

## 2019-09-19 NOTE — Assessment & Plan Note (Signed)
Improving, but not 100% and now dependent on O2 with ongoing SOB with exertion.  Will place referral to pulmonary for further testing and recommendations, as has underlying asthma.  Repeat CXR in and around April 15th (6 weeks).  Return in 4 weeks for follow-up.

## 2019-09-19 NOTE — Patient Instructions (Signed)
Carbohydrate Counting for Diabetes Mellitus, Adult  Carbohydrate counting is a method of keeping track of how many carbohydrates you eat. Eating carbohydrates naturally increases the amount of sugar (glucose) in the blood. Counting how many carbohydrates you eat helps keep your blood glucose within normal limits, which helps you manage your diabetes (diabetes mellitus). It is important to know how many carbohydrates you can safely have in each meal. This is different for every person. A diet and nutrition specialist (registered dietitian) can help you make a meal plan and calculate how many carbohydrates you should have at each meal and snack. Carbohydrates are found in the following foods:  Grains, such as breads and cereals.  Dried beans and soy products.  Starchy vegetables, such as potatoes, peas, and corn.  Fruit and fruit juices.  Milk and yogurt.  Sweets and snack foods, such as cake, cookies, candy, chips, and soft drinks. How do I count carbohydrates? There are two ways to count carbohydrates in food. You can use either of the methods or a combination of both. Reading "Nutrition Facts" on packaged food The "Nutrition Facts" list is included on the labels of almost all packaged foods and beverages in the [place] It includes:  The serving size.  Information about nutrients in each serving, including the grams (g) of carbohydrate per serving. To use the "Nutrition Facts":  Decide how many servings you will have.  Multiply the number of servings by the number of carbohydrates per serving.  The resulting number is the total amount of carbohydrates that you will be having. Learning standard serving sizes of other foods When you eat carbohydrate foods that are not packaged or do not include "Nutrition Facts" on the label, you need to measure the servings in order to count the amount of carbohydrates:  Measure the foods that you will eat with a food scale or measuring cup, if  needed.  Decide how many standard-size servings you will eat.  Multiply the number of servings by 15. Most carbohydrate-rich foods have about 15 g of carbohydrates per serving. ? For example, if you eat 8 oz (170 g) of strawberries, you will have eaten 2 servings and 30 g of carbohydrates (2 servings x 15 g = 30 g).  For foods that have more than one food mixed, such as soups and casseroles, you must count the carbohydrates in each food that is included. The following list contains standard serving sizes of common carbohydrate-rich foods. Each of these servings has about 15 g of carbohydrates:   hamburger bun or  English muffin.   oz (15 mL) syrup.   oz (14 g) jelly.  1 slice of bread.  1 six-inch tortilla.  3 oz (85 g) cooked rice or pasta.  4 oz (113 g) cooked dried beans.  4 oz (113 g) starchy vegetable, such as peas, corn, or potatoes.  4 oz (113 g) hot cereal.  4 oz (113 g) mashed potatoes or  of a large baked potato.  4 oz (113 g) canned or frozen fruit.  4 oz (120 mL) fruit juice.  4-6 crackers.  6 chicken nuggets.  6 oz (170 g) unsweetened dry cereal.  6 oz (170 g) plain fat-free yogurt or yogurt sweetened with artificial sweeteners.  8 oz (240 mL) milk.  8 oz (170 g) fresh fruit or one small piece of fruit.  24 oz (680 g) popped popcorn. Example of carbohydrate counting Sample meal  3 oz (85 g) chicken breast.  6 oz (170 g)   brown rice.  4 oz (113 g) corn.  8 oz (240 mL) milk.  8 oz (170 g) strawberries with sugar-free whipped topping. Carbohydrate calculation 1. Identify the foods that contain carbohydrates: ? Rice. ? Corn. ? Milk. ? Strawberries. 2. Calculate how many servings you have of each food: ? 2 servings rice. ? 1 serving corn. ? 1 serving milk. ? 1 serving strawberries. 3. Multiply each number of servings by 15 g: ? 2 servings rice x 15 g = 30 g. ? 1 serving corn x 15 g = 15 g. ? 1 serving milk x 15 g = 15 g. ? 1  serving strawberries x 15 g = 15 g. 4. Add together all of the amounts to find the total grams of carbohydrates eaten: ? 30 g + 15 g + 15 g + 15 g = 75 g of carbohydrates total. Summary  Carbohydrate counting is a method of keeping track of how many carbohydrates you eat.  Eating carbohydrates naturally increases the amount of sugar (glucose) in the blood.  Counting how many carbohydrates you eat helps keep your blood glucose within normal limits, which helps you manage your diabetes.  A diet and nutrition specialist (registered dietitian) can help you make a meal plan and calculate how many carbohydrates you should have at each meal and snack. This information is not intended to replace advice given to you by your health care provider. Make sure you discuss any questions you have with your health care provider. Document Revised: 01/15/2017 Document Reviewed: 12/05/2015 [employer] Patient Education  2020 [employer]  

## 2019-09-19 NOTE — Assessment & Plan Note (Signed)
Continue collaboration with urology. 

## 2019-09-19 NOTE — Assessment & Plan Note (Signed)
Continue collaboration with neurology due to significant stroke history.  Continue current medication regimen. 

## 2019-09-19 NOTE — Progress Notes (Signed)
BP 106/71   Pulse 70   Temp 98 F (36.7 C) (Oral)   Ht _0  (1.778 m)   Wt 190 lb (86.2 kg)   SpO2 96%   BMI 27.26 kg/m    Subjective:    Patient ID: Andrew Cobb, male    DOB: 30-Apr-1951, 69 y.o.   MRN: [id number]  HPI: Andrew Cobb is a 69 y.o. male  Chief Complaint  Patient presents with  . Diabetes  . Hypertension  . Hyperlipidemia   Transition of Care Hospital Follow up.  Admitted to [facility] on 09/07/2019 and discharged on 09/16/2019 for acute encephalopathy.  Covid was negative on 09/08/2019.  "Recommendations for Outpatient Follow-up:  1. Follow up with PCP in 1-2 weeks 2. Please obtain BMP/CBC in one week your next doctors visit.  3. Keppra 500 mg twice daily 4. Follow-up patient neurology in 4 weeks 5. Recommended against driving, instructions have been given to him.  He is not allowed to drive at least until seen by outpatient neurology and cleared by them. 6. Supplemental oxygen for home has been arranged for 7. 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 MRI showed acute CVA along with chronic changes, neurology team was consulted. There was also concerns of seizure therefore started on Keppra. CT of the chest without contrast showed groundglass opacity concerns for pneumonitis/pneumonia.  Despite of several days of bronchodilator treatment, he still remained hypoxic without any evidence of fluid overload or infection.  Suspect is this aspiration pneumonitis causing his hypoxia therefore home oxygen has been arranged for.  Remains medically stable at this time.  His care has been  extensively discussed by me with his wife.  Acute encephalopathy, persist Acute seizures, Acute CVA 109m left parietal white matter per MRI but neurology thinks is chronic.  History of recurrent embolic infarcts/CVA with hemorrhagic conversion in July 2020 -Status post loading dose of Keppra, Keppra 500 mg twice daily -EEG-negative for seizures but shows encephalopathy. Continue seizure precaution -Per neurology team patient has chronic CVA.  Follow-up outpatient with neurology.  Recommends against driving, he has been instructed about this.  Wife knows about this as well.  Patient and wife "understands this. -Aspirin 81 mg, atorvastatin 40 mg daily -CT head-negative -UA-shows glucosuria but no evidence of infection  Bilateral multifocal infiltrate Aspiration pneumonitis/pneumonia in the setting of seizure -Ambulatory pulse ox still dropping down to 83% requiring 4 L nasal cannula.  Home oxygen arranged for.  No shortness of breath with ambulation at all.  Doing well with incentive spirometer. -COVID-19-negative  -Procalcitonin-negative, BNP Normal. Discontinue Zosyn after today -Respiratory panel-negative -Echocardiogram March 2020-EF 60 to 65%, mild LVH -I-S//flutter. Scheduled and as needed bronchodilators -CT chest consistent with bilateral infiltrates-aspiration? -aspiration precautions. -Seen by speech team status post MBS-recommending regular diet  Acute kidney injury, prerenal -Appears to have resolved. Creatinine at baseline around 1.1  Coronary artery disease status post PCI -Currently chest pain-free. Continue aspirin and statin  Essential hypertension -Benzapril on hold due to AKI  D M2, poorly controlled due to hyperglycemia -Home p.o. meds on hold, A1c 9.2 -Resume his home regimen  Hyperlipidemia -Statin  OSA -CPAP  PT-Home health-PT/OT/RN. "  H[facility] Physician: Dr. Reesa Chew D/C Date: 09/16/2019  Records Requested:  09/19/2019 Records Received: 09/19/2019 Records Reviewed: 09/19/2019  Diagnoses on Discharge:  Principal Problem:   Altered mental status   Bilateral pneumonia  Date of interactive Contact within 48 hours of discharge:  Contact was through: phone  Date of 7 day or 14 day face-to-face visit:    within 14 days  Outpatient Encounter Medications as of 09/19/2019  Medication Sig  . albuterol (VENTOLIN HFA) 108 (90 Base) MCG/ACT inhaler Inhale 2 puffs into the lungs every 6 (six) hours as needed for wheezing or shortness of breath.  [name] Kitchen amitriptyline (ELAVIL) 25 MG tablet Take 1 tablet (25 mg total) by mouth at bedtime.  [name] Kitchen aspirin EC 81 MG EC tablet Take 1 tablet (81 mg total) by mouth daily.  [name] Kitchen atorvastatin (LIPITOR) 40 MG tablet Take 1 tablet (40 mg total) by mouth at bedtime.  . benazepril (LOTENSIN) 40 MG tablet Take 1 tablet (40 mg total) by mouth daily.  . cetirizine (ZYRTEC) 10 MG tablet Take 10 mg by mouth daily.  . Cholecalciferol (VITAMIN D) 2000 units CAPS Take 2,000 Units by mouth daily.  . empagliflozin (JARDIANCE) 10 MG TABS tablet Take 10 mg by mouth daily before breakfast.  . fluticasone (FLONASE) 50 MCG/ACT nasal spray Place 1 spray into the nose 2 (two) times daily as needed for congestion.  [name] Kitchen glucose blood test strip 1 each by Other route 2 (two) times daily. DX E11.9  . Lancets (ONETOUCH ULTRASOFT) lancets 1 each by Other route 2 (two) times daily. Dx E11.9  . levETIRAcetam (KEPPRA) 500 MG tablet Take 1 tablet (500 mg total) by mouth 2 (two) times daily.  . Magnesium 250 MG TABS Take 250 mg by mouth at bedtime.   . metFORMIN (GLUCOPHAGE) 500 MG tablet Take 2 tablets (1,000 mg total) by mouth 2 (two) times daily with a meal.  . Multiple Vitamins-Minerals (MULTIVITAMIN PO) Take 1 tablet by mouth daily.   . valACYclovir (VALTREX) 500 MG tablet Take 1 tablet (500 mg total) by mouth daily.  . [DISCONTINUED] metFORMIN (GLUCOPHAGE) 500 MG tablet Take 2 tablets (1,000 mg total) by  mouth 2 (two) times daily with a meal. (Patient taking differently: Take 500 mg by mouth 2 (two) times daily with a meal. 1 tab twice a day)  . [DISCONTINUED] metFORMIN (GLUCOPHAGE) 500 MG tablet Take 2 tablets (1,000 mg total) by mouth 2 (two) times daily with a meal.  . albuterol (PROVENTIL) (2.5 MG/3ML) 0.083% nebulizer solution Take 3 mLs (2.5 mg total) by nebulization every 6 (six) hours as needed for wheezing or shortness of breath.  . budesonide-formoterol (SYMBICORT) 160-4.5 MCG/ACT inhaler Inhale 2 puffs into the lungs 2 (two) times daily.  . [DISCONTINUED] Budesonide (PULMICORT FLEXHALER) 90 MCG/ACT inhaler Inhale 1-2 puffs into the lungs 2 (two) times daily. (Patient not taking: Reported on 09/19/2019)   No facility-administered encounter medications on file as of 09/19/2019.    Diagnostic Tests Reviewed/Disposition: Multiple blood work obtained, reviewed below  CLINICAL DATA:  Aspiration in the setting of seizure  EXAM: CT CHEST WITHOUT CONTRAST  TECHNIQUE: Multidetector CT imaging of the chest was performed following the standard protocol without IV contrast.  COMPARISON:  Chest radiograph 09/08/2019  FINDINGS: Cardiovascular: Three-vessel coronary artery disease is noted. Normal heart size. No pericardial effusion. Atherosclerotic plaque within the normal caliber aorta. Shared origin of the brachiocephalic and left common carotid artery with mild plaque in the proximal great vessels. Central pulmonary arteries are  normal caliber. Luminal evaluation precluded in the absence of intravenous contrast media.  Mediastinum/Nodes: No mediastinal fluid or gas. Normal thyroid gland and thoracic inlet. No acute abnormality of the trachea or esophagus. Posterior bowing of the trachea likely reflects imaging during exhalation. No worrisome mediastinal or axillary adenopathy. Hilar nodal evaluation is limited in the absence of intravenous contrast media.  Lungs/Pleura:  Widespread multifocal areas of ground-glass and mixed consolidation with a basilar and peripheral predominance. No pneumothorax or effusion. Evaluation for underlying pulmonary nodules is limited in the setting of extensive parenchymal disease.  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 Prior cholecystectomy noted as well. No acute abnormalities present in the visualized portions of the upper abdomen.  Musculoskeletal: Multilevel degenerative changes are present in the imaged portions of the spine. Prior cervical fusion without gross abnormality at this time. Left chest wall implantable loop recorder is noted.  IMPRESSION: Widespread areas of mixed ground-glass and consolidative opacity. Findings could be seen in the setting of aspiration pneumonia/pneumonitis. However, please note such a pattern could also be seen with atypical viral infections including COVID-19 with a lack of fluid-filled airways at least warranting consideration of a differential.  Aortic Atherosclerosis (ICD10-I70.0).  Consults: none  Discharge Instructions: Follow-up with PCP and neurology  Disease/illness Education: Reviewed with patient  Home Health/Community Services Discussions/Referrals: Home PT and OT  Establishment or re-establishment of referral orders for community resources: none  Discussion with other health care providers: reviewed notes  Assessment and Support of treatment regimen adherence: reviewed with patient  Appointments Coordinated with: reviewed with patient  Education for self-management, independent living, and ADLs: reviewed with patient  DIABETES Last A1C in March while in hospital was 9.2%, which is up from previous 7%.  He continues on Metformin 500 MG BID and Jardiance 10 MG. Hypoglycemic episodes:no Polydipsia/polyuria: no Visual disturbance: no Chest pain: no Paresthesias: no Glucose Monitoring: yes             Accucheck frequency:  Daily             Fasting glucose: 125 to 145             Post prandial:             Evening:             Before meals: Taking Insulin?: no             Long acting insulin:             Short acting insulin: Blood Pressure Monitoring: daily Retinal Examination: Not Up to Date Foot Exam: Up to Date Pneumovax: refused Influenza: Up to Date Aspirin: yes   HYPERTENSION / HYPERLIPIDEMIA Continues Benazepril 40 MG and Atorvastatin 40 MG daily.  Followed by neurology due to recent stroke events x 3.  Last saw them 06/01/2019.  Currently has loop recorder in place.  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  Had recent hospitalization and was started on Keppra 500 MG BID for seizure activity. Satisfied with current treatment? yes Duration of hypertension: chronic BP monitoring frequency: daily BP range: 125-140/70-75 BP medication side effects: no Duration of hyperlipidemia: chronic Cholesterol medication side effects: no Cholesterol supplements: none Medication compliance: good compliance Aspirin: yes Recent stressors: no Recurrent headaches: no Visual changes: no Palpitations: no Dyspnea: yes Chest pain: no Lower extremity edema: no Dizzy/lightheaded: no  The ASCVD Risk score [name], et al., 2013) failed to calculate for the following reasons:   The patient has a  prior MI or stroke diagnosis  HISTORY OF PROSTATE CA: Previously followed by Dr. Bernardo Heater.  Last seen 07/27/2019.  MRI was performed to assess.  Relevant past medical, surgical, family and social history reviewed and updated as indicated. Interim medical history since our last visit reviewed. Allergies and medications reviewed and updated.  Review of Systems  Constitutional: Negative for activity change, diaphoresis, fatigue and fever.  Respiratory: Positive for cough and shortness of breath. Negative for chest tightness and wheezing.   Cardiovascular: Negative for chest pain, palpitations and leg swelling.    Gastrointestinal: Negative.   Endocrine: Negative for cold intolerance, heat intolerance, polydipsia, polyphagia and polyuria.  Neurological: Positive for weakness. Negative for dizziness, seizures, syncope, facial asymmetry, speech difficulty and headaches.  Psychiatric/Behavioral: Negative.     Per HPI unless specifically indicated above     Objective:    BP 106/71   Pulse 70   Temp 98 F (36.7 C) (Oral)   Ht _0  (1.778 m)   Wt 190 lb (86.2 kg)   SpO2 96%   BMI 27.26 kg/m   Wt Readings from Last 3 Encounters:  09/19/19 190 lb (86.2 kg)  09/08/19 200 lb (90.7 kg)  07/27/19 200 lb (90.7 kg)    Physical Exam Vitals and nursing note reviewed.  Constitutional:      General: He is awake. He is not in acute distress.    Appearance: He is well-developed and well-groomed. He is not ill-appearing.  HENT:     Head: Normocephalic and atraumatic.     Right Ear: Hearing normal. No drainage.     Left Ear: Hearing normal. No drainage.     Nose:     Comments: Mask in place. Eyes:     General: Lids are normal.        Right eye: No discharge.        Left eye: No discharge.     Conjunctiva/sclera: Conjunctivae normal.     Pupils: Pupils are equal, round, and reactive to light.  Neck:     Thyroid: No thyromegaly.     Vascular: No carotid bruit.  Cardiovascular:     Rate and Rhythm: Normal rate and regular rhythm.     Heart sounds: Normal heart sounds, S1 normal and S2 normal. No murmur. No gallop.   Pulmonary:     Effort: Pulmonary effort is normal. No accessory muscle usage or respiratory distress.     Breath sounds: Decreased breath sounds present.     Comments: Diminished breath sounds throughout, no adventitious sounds noted.  Intermittent dry cough present.  O2 in place at 2 L.   Abdominal:     General: Bowel sounds are normal.     Palpations: Abdomen is soft.  Musculoskeletal:        General: Normal range of motion.     Cervical back: Normal range of motion and neck  supple.     Right lower leg: No edema.     Left lower leg: No edema.  Skin:    General: Skin is warm and dry.     Capillary Refill: Capillary refill takes less than 2 seconds.  Neurological:     Mental Status: He is alert and oriented to person, place, and time.  Psychiatric:        Attention and Perception: Attention normal.        Mood and Affect: Mood normal.        Speech: Speech normal.        Behavior:  Behavior normal. Behavior is cooperative.        Thought Content: Thought content normal.    Results for orders placed or performed during the hospital encounter of 09/07/19  Respiratory Panel by PCR   Specimen: Nasopharyngeal Swab; Respiratory  Result Value Ref Range   Adenovirus NOT DETECTED NOT DETECTED   Coronavirus 229E NOT DETECTED NOT DETECTED   Coronavirus HKU1 NOT DETECTED NOT DETECTED   Coronavirus NL63 NOT DETECTED NOT DETECTED   Coronavirus OC43 NOT DETECTED NOT DETECTED   Metapneumovirus NOT DETECTED NOT DETECTED   Rhinovirus / Enterovirus NOT DETECTED NOT DETECTED   Influenza A NOT DETECTED NOT DETECTED   Influenza B NOT DETECTED NOT DETECTED   Parainfluenza Virus 1 NOT DETECTED NOT DETECTED   Parainfluenza Virus 2 NOT DETECTED NOT DETECTED   Parainfluenza Virus 3 NOT DETECTED NOT DETECTED   Parainfluenza Virus 4 NOT DETECTED NOT DETECTED   Respiratory Syncytial Virus NOT DETECTED NOT DETECTED   Bordetella pertussis NOT DETECTED NOT DETECTED   Chlamydophila pneumoniae NOT DETECTED NOT DETECTED   Mycoplasma pneumoniae NOT DETECTED NOT DETECTED  MRSA PCR Screening   Specimen: Nasopharyngeal  Result Value Ref Range   MRSA by PCR POSITIVE (A) NEGATIVE  Ethanol  Result Value Ref Range   Alcohol, Ethyl (B) <10 <10 mg/dL  Protime-INR  Result Value Ref Range   Prothrombin Time 13.5 11.4 - 15.2 seconds   INR 1.0 0.8 - 1.2  APTT  Result Value Ref Range   aPTT 28 24 - 36 seconds  CBC  Result Value Ref Range   WBC 8.2 4.0 - 10.5 K/uL   RBC 4.51 4.22 - 5.81  MIL/uL   Hemoglobin 13.8 13.0 - 17.0 g/dL   HCT 44.1 39.0 - 52.0 %   MCV 97.8 80.0 - 100.0 fL   MCH 30.6 26.0 - 34.0 pg   MCHC 31.3 30.0 - 36.0 g/dL   RDW 13.9 11.5 - 15.5 %   Platelets 142 (L) 150 - 400 K/uL   nRBC 0.0 0.0 - 0.2 %  Differential  Result Value Ref Range   Neutrophils Relative % 86 %   Neutro Abs 7.0 1.7 - 7.7 K/uL   Lymphocytes Relative 9 %   Lymphs Abs 0.7 0.7 - 4.0 K/uL   Monocytes Relative 4 %   Monocytes Absolute 0.3 0.1 - 1.0 K/uL   Eosinophils Relative 0 %   Eosinophils Absolute 0.0 0.0 - 0.5 K/uL   Basophils Relative 0 %   Basophils Absolute 0.0 0.0 - 0.1 K/uL   Immature Granulocytes 1 %   Abs Immature Granulocytes 0.06 0.00 - 0.07 K/uL  Comprehensive metabolic panel  Result Value Ref Range   Sodium 134 (L) 135 - 145 mmol/L   Potassium 5.0 3.5 - 5.1 mmol/L   Chloride 101 98 - 111 mmol/L   CO2 18 (L) 22 - 32 mmol/L   Glucose, Bld 161 (H) 70 - 99 mg/dL   BUN 25 (H) 8 - 23 mg/dL   Creatinine, Ser 1.29 (H) 0.61 - 1.24 mg/dL   Calcium 8.0 (L) 8.9 - 10.3 mg/dL   Total Protein 6.1 (L) 6.5 - 8.1 g/dL   Albumin 3.2 (L) 3.5 - 5.0 g/dL   AST 52 (H) 15 - 41 U/L   ALT 36 0 - 44 U/L   Alkaline Phosphatase 52 38 - 126 U/L   Total Bilirubin 1.5 (H) 0.3 - 1.2 mg/dL   GFR calc non Af Amer 57 (L) >60 mL/min   GFR  calc Af Amer >60 >60 mL/min   Anion gap 15 5 - 15  Urine rapid drug screen (hosp performed)  Result Value Ref Range   Opiates NONE DETECTED NONE DETECTED   Cocaine NONE DETECTED NONE DETECTED   Benzodiazepines NONE DETECTED NONE DETECTED   Amphetamines NONE DETECTED NONE DETECTED   Tetrahydrocannabinol NONE DETECTED NONE DETECTED   Barbiturates NONE DETECTED NONE DETECTED  Urinalysis, Routine w reflex microscopic  Result Value Ref Range   Color, Urine YELLOW YELLOW   APPearance CLEAR CLEAR   Specific Gravity, Urine 1.028 1.005 - 1.030   pH 5.0 5.0 - 8.0   Glucose, UA >=500 (A) NEGATIVE mg/dL   Hgb urine dipstick NEGATIVE NEGATIVE   Bilirubin  Urine NEGATIVE NEGATIVE   Ketones, ur 80 (A) NEGATIVE mg/dL   Protein, ur NEGATIVE NEGATIVE mg/dL   Nitrite NEGATIVE NEGATIVE   Leukocytes,Ua NEGATIVE NEGATIVE   WBC, UA 0-5 0 - 5 WBC/hpf   Bacteria, UA NONE SEEN NONE SEEN   Squamous Epithelial / LPF 0-5 0 - 5   Mucus PRESENT   Hemoglobin A1c  Result Value Ref Range   Hgb A1c MFr Bld 9.2 (H) 4.8 - 5.6 %   Mean Plasma Glucose 217.34 mg/dL  Lipid panel  Result Value Ref Range   Cholesterol 90 0 - 200 mg/dL   Triglycerides 73 <150 mg/dL   HDL 35 (L) >40 mg/dL   Total CHOL/HDL Ratio 2.6 RATIO   VLDL 15 0 - 40 mg/dL   LDL Cholesterol 40 0 - 99 mg/dL  Procalcitonin - Baseline  Result Value Ref Range   Procalcitonin <0.10 ng/mL  D-dimer, quantitative (not at [facility])  Result Value Ref Range   D-Dimer, Quant 0.94 (H) 0.00 - 0.50 ug/mL-FEU  Sedimentation rate  Result Value Ref Range   Sed Rate 39 (H) 0 - 16 mm/hr  Comprehensive metabolic panel  Result Value Ref Range   Sodium 136 135 - 145 mmol/L   Potassium 4.7 3.5 - 5.1 mmol/L   Chloride 102 98 - 111 mmol/L   CO2 22 22 - 32 mmol/L   Glucose, Bld 127 (H) 70 - 99 mg/dL   BUN 25 (H) 8 - 23 mg/dL   Creatinine, Ser 1.18 0.61 - 1.24 mg/dL   Calcium 7.9 (L) 8.9 - 10.3 mg/dL   Total Protein 5.7 (L) 6.5 - 8.1 g/dL   Albumin 2.8 (L) 3.5 - 5.0 g/dL   AST 50 (H) 15 - 41 U/L   ALT 36 0 - 44 U/L   Alkaline Phosphatase 51 38 - 126 U/L   Total Bilirubin 1.7 (H) 0.3 - 1.2 mg/dL   GFR calc non Af Amer >60 >60 mL/min   GFR calc Af Amer >60 >60 mL/min   Anion gap 12 5 - 15  Ferritin  Result Value Ref Range   Ferritin 892 (H) 24 - 336 ng/mL  Comprehensive metabolic panel  Result Value Ref Range   Sodium 135 135 - 145 mmol/L   Potassium 4.5 3.5 - 5.1 mmol/L   Chloride 102 98 - 111 mmol/L   CO2 17 (L) 22 - 32 mmol/L   Glucose, Bld 155 (H) 70 - 99 mg/dL   BUN 26 (H) 8 - 23 mg/dL   Creatinine, Ser 1.16 0.61 - 1.24 mg/dL   Calcium 8.0 (L) 8.9 - 10.3 mg/dL   Total Protein 5.9 (L) 6.5 - 8.1  g/dL   Albumin 2.9 (L) 3.5 - 5.0 g/dL   AST 52 (H) 15 -  41 U/L   ALT 36 0 - 44 U/L   Alkaline Phosphatase 51 38 - 126 U/L   Total Bilirubin 1.6 (H) 0.3 - 1.2 mg/dL   GFR calc non Af Amer >60 >60 mL/min   GFR calc Af Amer >60 >60 mL/min   Anion gap 16 (H) 5 - 15  CBC  Result Value Ref Range   WBC 6.7 4.0 - 10.5 K/uL   RBC 4.37 4.22 - 5.81 MIL/uL   Hemoglobin 13.2 13.0 - 17.0 g/dL   HCT 40.2 39.0 - 52.0 %   MCV 92.0 80.0 - 100.0 fL   MCH 30.2 26.0 - 34.0 pg   MCHC 32.8 30.0 - 36.0 g/dL   RDW 13.4 11.5 - 15.5 %   Platelets 173 150 - 400 K/uL   nRBC 0.0 0.0 - 0.2 %  Brain natriuretic peptide  Result Value Ref Range   B Natriuretic Peptide 22.5 0.0 - 100.0 pg/mL  Glucose, capillary  Result Value Ref Range   Glucose-Capillary 155 (H) 70 - 99 mg/dL  Glucose, capillary  Result Value Ref Range   Glucose-Capillary 162 (H) 70 - 99 mg/dL  Glucose, capillary  Result Value Ref Range   Glucose-Capillary 134 (H) 70 - 99 mg/dL  CBC  Result Value Ref Range   WBC 5.6 4.0 - 10.5 K/uL   RBC 4.08 (L) 4.22 - 5.81 MIL/uL   Hemoglobin 12.4 (L) 13.0 - 17.0 g/dL   HCT 37.7 (L) 39.0 - 52.0 %   MCV 92.4 80.0 - 100.0 fL   MCH 30.4 26.0 - 34.0 pg   MCHC 32.9 30.0 - 36.0 g/dL   RDW 13.3 11.5 - 15.5 %   Platelets 191 150 - 400 K/uL   nRBC 0.0 0.0 - 0.2 %  Comprehensive metabolic panel  Result Value Ref Range   Sodium 134 (L) 135 - 145 mmol/L   Potassium 4.1 3.5 - 5.1 mmol/L   Chloride 102 98 - 111 mmol/L   CO2 20 (L) 22 - 32 mmol/L   Glucose, Bld 150 (H) 70 - 99 mg/dL   BUN 26 (H) 8 - 23 mg/dL   Creatinine, Ser 1.22 0.61 - 1.24 mg/dL   Calcium 8.0 (L) 8.9 - 10.3 mg/dL   Total Protein 5.5 (L) 6.5 - 8.1 g/dL   Albumin 2.6 (L) 3.5 - 5.0 g/dL   AST 54 (H) 15 - 41 U/L   ALT 38 0 - 44 U/L   Alkaline Phosphatase 51 38 - 126 U/L   Total Bilirubin 2.1 (H) 0.3 - 1.2 mg/dL   GFR calc non Af Amer >60 >60 mL/min   GFR calc Af Amer >60 >60 mL/min   Anion gap 12 5 - 15  Magnesium  Result Value Ref  Range   Magnesium 2.2 1.7 - 2.4 mg/dL  Glucose, capillary  Result Value Ref Range   Glucose-Capillary 160 (H) 70 - 99 mg/dL  Glucose, capillary  Result Value Ref Range   Glucose-Capillary 154 (H) 70 - 99 mg/dL  Glucose, capillary  Result Value Ref Range   Glucose-Capillary 255 (H) 70 - 99 mg/dL  Glucose, capillary  Result Value Ref Range   Glucose-Capillary 217 (H) 70 - 99 mg/dL  CBC  Result Value Ref Range   WBC 5.6 4.0 - 10.5 K/uL   RBC 4.20 (L) 4.22 - 5.81 MIL/uL   Hemoglobin 12.7 (L) 13.0 - 17.0 g/dL   HCT 38.8 (L) 39.0 - 52.0 %   MCV 92.4 80.0 - 100.0 fL  MCH 30.2 26.0 - 34.0 pg   MCHC 32.7 30.0 - 36.0 g/dL   RDW 13.3 11.5 - 15.5 %   Platelets 205 150 - 400 K/uL   nRBC 0.0 0.0 - 0.2 %  Comprehensive metabolic panel  Result Value Ref Range   Sodium 136 135 - 145 mmol/L   Potassium 3.9 3.5 - 5.1 mmol/L   Chloride 102 98 - 111 mmol/L   CO2 24 22 - 32 mmol/L   Glucose, Bld 167 (H) 70 - 99 mg/dL   BUN 20 8 - 23 mg/dL   Creatinine, Ser 1.12 0.61 - 1.24 mg/dL   Calcium 8.1 (L) 8.9 - 10.3 mg/dL   Total Protein 5.6 (L) 6.5 - 8.1 g/dL   Albumin 2.5 (L) 3.5 - 5.0 g/dL   AST 49 (H) 15 - 41 U/L   ALT 36 0 - 44 U/L   Alkaline Phosphatase 50 38 - 126 U/L   Total Bilirubin 2.3 (H) 0.3 - 1.2 mg/dL   GFR calc non Af Amer >60 >60 mL/min   GFR calc Af Amer >60 >60 mL/min   Anion gap 10 5 - 15  Magnesium  Result Value Ref Range   Magnesium 2.1 1.7 - 2.4 mg/dL  Glucose, capillary  Result Value Ref Range   Glucose-Capillary 138 (H) 70 - 99 mg/dL  Glucose, capillary  Result Value Ref Range   Glucose-Capillary 166 (H) 70 - 99 mg/dL  Glucose, capillary  Result Value Ref Range   Glucose-Capillary 166 (H) 70 - 99 mg/dL  Glucose, capillary  Result Value Ref Range   Glucose-Capillary 192 (H) 70 - 99 mg/dL  Comprehensive metabolic panel  Result Value Ref Range   Sodium 135 135 - 145 mmol/L   Potassium 3.7 3.5 - 5.1 mmol/L   Chloride 99 98 - 111 mmol/L   CO2 24 22 - 32 mmol/L     Glucose, Bld 193 (H) 70 - 99 mg/dL   BUN 20 8 - 23 mg/dL   Creatinine, Ser 1.11 0.61 - 1.24 mg/dL   Calcium 8.0 (L) 8.9 - 10.3 mg/dL   Total Protein 5.6 (L) 6.5 - 8.1 g/dL   Albumin 2.2 (L) 3.5 - 5.0 g/dL   AST 55 (H) 15 - 41 U/L   ALT 41 0 - 44 U/L   Alkaline Phosphatase 51 38 - 126 U/L   Total Bilirubin 1.8 (H) 0.3 - 1.2 mg/dL   GFR calc non Af Amer >60 >60 mL/min   GFR calc Af Amer >60 >60 mL/min   Anion gap 12 5 - 15  Magnesium  Result Value Ref Range   Magnesium 2.0 1.7 - 2.4 mg/dL  Glucose, capillary  Result Value Ref Range   Glucose-Capillary 180 (H) 70 - 99 mg/dL  CBC  Result Value Ref Range   WBC 5.1 4.0 - 10.5 K/uL   RBC 3.98 (L) 4.22 - 5.81 MIL/uL   Hemoglobin 12.0 (L) 13.0 - 17.0 g/dL   HCT 36.5 (L) 39.0 - 52.0 %   MCV 91.7 80.0 - 100.0 fL   MCH 30.2 26.0 - 34.0 pg   MCHC 32.9 30.0 - 36.0 g/dL   RDW 13.2 11.5 - 15.5 %   Platelets 212 150 - 400 K/uL   nRBC 0.0 0.0 - 0.2 %  Glucose, capillary  Result Value Ref Range   Glucose-Capillary 169 (H) 70 - 99 mg/dL  Glucose, capillary  Result Value Ref Range   Glucose-Capillary 229 (H) 70 - 99 mg/dL  Glucose, capillary  Result Value Ref Range   Glucose-Capillary 145 (H) 70 - 99 mg/dL  Basic metabolic panel  Result Value Ref Range   Sodium 135 135 - 145 mmol/L   Potassium 3.7 3.5 - 5.1 mmol/L   Chloride 100 98 - 111 mmol/L   CO2 25 22 - 32 mmol/L   Glucose, Bld 206 (H) 70 - 99 mg/dL   BUN 18 8 - 23 mg/dL   Creatinine, Ser 1.02 0.61 - 1.24 mg/dL   Calcium 8.3 (L) 8.9 - 10.3 mg/dL   GFR calc non Af Amer >60 >60 mL/min   GFR calc Af Amer >60 >60 mL/min   Anion gap 10 5 - 15  Magnesium  Result Value Ref Range   Magnesium 2.1 1.7 - 2.4 mg/dL  CBC  Result Value Ref Range   WBC 4.8 4.0 - 10.5 K/uL   RBC 3.99 (L) 4.22 - 5.81 MIL/uL   Hemoglobin 12.0 (L) 13.0 - 17.0 g/dL   HCT 36.8 (L) 39.0 - 52.0 %   MCV 92.2 80.0 - 100.0 fL   MCH 30.1 26.0 - 34.0 pg   MCHC 32.6 30.0 - 36.0 g/dL   RDW 13.0 11.5 - 15.5 %    Platelets 236 150 - 400 K/uL   nRBC 0.0 0.0 - 0.2 %  Brain natriuretic peptide  Result Value Ref Range   B Natriuretic Peptide 23.5 0.0 - 100.0 pg/mL  Procalcitonin - Baseline  Result Value Ref Range   Procalcitonin <0.10 ng/mL  Glucose, capillary  Result Value Ref Range   Glucose-Capillary 220 (H) 70 - 99 mg/dL  Glucose, capillary  Result Value Ref Range   Glucose-Capillary 194 (H) 70 - 99 mg/dL  Glucose, capillary  Result Value Ref Range   Glucose-Capillary 221 (H) 70 - 99 mg/dL  Glucose, capillary  Result Value Ref Range   Glucose-Capillary 200 (H) 70 - 99 mg/dL  Glucose, capillary  Result Value Ref Range   Glucose-Capillary 202 (H) 70 - 99 mg/dL  Glucose, capillary  Result Value Ref Range   Glucose-Capillary 233 (H) 70 - 99 mg/dL  Glucose, capillary  Result Value Ref Range   Glucose-Capillary 162 (H) 70 - 99 mg/dL  Basic metabolic panel  Result Value Ref Range   Sodium 135 135 - 145 mmol/L   Potassium 3.7 3.5 - 5.1 mmol/L   Chloride 98 98 - 111 mmol/L   CO2 26 22 - 32 mmol/L   Glucose, Bld 168 (H) 70 - 99 mg/dL   BUN 22 8 - 23 mg/dL   Creatinine, Ser 1.06 0.61 - 1.24 mg/dL   Calcium 8.6 (L) 8.9 - 10.3 mg/dL   GFR calc non Af Amer >60 >60 mL/min   GFR calc Af Amer >60 >60 mL/min   Anion gap 11 5 - 15  Magnesium  Result Value Ref Range   Magnesium 2.2 1.7 - 2.4 mg/dL  CBC  Result Value Ref Range   WBC 5.2 4.0 - 10.5 K/uL   RBC 4.16 (L) 4.22 - 5.81 MIL/uL   Hemoglobin 12.7 (L) 13.0 - 17.0 g/dL   HCT 38.9 (L) 39.0 - 52.0 %   MCV 93.5 80.0 - 100.0 fL   MCH 30.5 26.0 - 34.0 pg   MCHC 32.6 30.0 - 36.0 g/dL   RDW 12.9 11.5 - 15.5 %   Platelets 250 150 - 400 K/uL   nRBC 0.0 0.0 - 0.2 %  Glucose, capillary  Result Value Ref Range   Glucose-Capillary 208 (H) 70 - 99  mg/dL  Glucose, capillary  Result Value Ref Range   Glucose-Capillary 159 (H) 70 - 99 mg/dL  Glucose, capillary  Result Value Ref Range   Glucose-Capillary 159 (H) 70 - 99 mg/dL  Glucose,  capillary  Result Value Ref Range   Glucose-Capillary 143 (H) 70 - 99 mg/dL  Glucose, capillary  Result Value Ref Range   Glucose-Capillary 173 (H) 70 - 99 mg/dL  Glucose, capillary  Result Value Ref Range   Glucose-Capillary 251 (H) 70 - 99 mg/dL  Glucose, capillary  Result Value Ref Range   Glucose-Capillary 185 (H) 70 - 99 mg/dL  Basic metabolic panel  Result Value Ref Range   Sodium 132 (L) 135 - 145 mmol/L   Potassium 3.5 3.5 - 5.1 mmol/L   Chloride 96 (L) 98 - 111 mmol/L   CO2 25 22 - 32 mmol/L   Glucose, Bld 157 (H) 70 - 99 mg/dL   BUN 21 8 - 23 mg/dL   Creatinine, Ser 1.06 0.61 - 1.24 mg/dL   Calcium 8.3 (L) 8.9 - 10.3 mg/dL   GFR calc non Af Amer >60 >60 mL/min   GFR calc Af Amer >60 >60 mL/min   Anion gap 11 5 - 15  Magnesium  Result Value Ref Range   Magnesium 1.9 1.7 - 2.4 mg/dL  CBC  Result Value Ref Range   WBC 4.4 4.0 - 10.5 K/uL   RBC 3.78 (L) 4.22 - 5.81 MIL/uL   Hemoglobin 11.4 (L) 13.0 - 17.0 g/dL   HCT 35.1 (L) 39.0 - 52.0 %   MCV 92.9 80.0 - 100.0 fL   MCH 30.2 26.0 - 34.0 pg   MCHC 32.5 30.0 - 36.0 g/dL   RDW 12.8 11.5 - 15.5 %   Platelets 221 150 - 400 K/uL   nRBC 0.0 0.0 - 0.2 %  Glucose, capillary  Result Value Ref Range   Glucose-Capillary 152 (H) 70 - 99 mg/dL  Glucose, capillary  Result Value Ref Range   Glucose-Capillary 187 (H) 70 - 99 mg/dL  Glucose, capillary  Result Value Ref Range   Glucose-Capillary 123 (H) 70 - 99 mg/dL  Glucose, capillary  Result Value Ref Range   Glucose-Capillary 194 (H) 70 - 99 mg/dL  Basic metabolic panel  Result Value Ref Range   Sodium 134 (L) 135 - 145 mmol/L   Potassium 4.2 3.5 - 5.1 mmol/L   Chloride 96 (L) 98 - 111 mmol/L   CO2 25 22 - 32 mmol/L   Glucose, Bld 159 (H) 70 - 99 mg/dL   BUN 21 8 - 23 mg/dL   Creatinine, Ser 0.99 0.61 - 1.24 mg/dL   Calcium 8.2 (L) 8.9 - 10.3 mg/dL   GFR calc non Af Amer >60 >60 mL/min   GFR calc Af Amer >60 >60 mL/min   Anion gap 13 5 - 15  Magnesium  Result  Value Ref Range   Magnesium 1.9 1.7 - 2.4 mg/dL  CBC  Result Value Ref Range   WBC 4.4 4.0 - 10.5 K/uL   RBC 3.94 (L) 4.22 - 5.81 MIL/uL   Hemoglobin 12.3 (L) 13.0 - 17.0 g/dL   HCT 36.9 (L) 39.0 - 52.0 %   MCV 93.7 80.0 - 100.0 fL   MCH 31.2 26.0 - 34.0 pg   MCHC 33.3 30.0 - 36.0 g/dL   RDW 12.9 11.5 - 15.5 %   Platelets 218 150 - 400 K/uL   nRBC 0.0 0.0 - 0.2 %  Glucose, capillary  Result  Value Ref Range   Glucose-Capillary 174 (H) 70 - 99 mg/dL  Glucose, capillary  Result Value Ref Range   Glucose-Capillary 142 (H) 70 - 99 mg/dL  Glucose, capillary  Result Value Ref Range   Glucose-Capillary 219 (H) 70 - 99 mg/dL   Comment 1 Procedure Error   Glucose, capillary  Result Value Ref Range   Glucose-Capillary 261 (H) 70 - 99 mg/dL  CBG monitoring, ED  Result Value Ref Range   Glucose-Capillary 185 (H) 70 - 99 mg/dL   Comment 1 Notify RN    Comment 2 Document in Chart   I-stat chem 8, ED  Result Value Ref Range   Sodium 132 (L) 135 - 145 mmol/L   Potassium 5.1 3.5 - 5.1 mmol/L   Chloride 102 98 - 111 mmol/L   BUN 29 (H) 8 - 23 mg/dL   Creatinine, Ser 1.20 0.61 - 1.24 mg/dL   Glucose, Bld 160 (H) 70 - 99 mg/dL   Calcium, Ion 0.97 (L) 1.15 - 1.40 mmol/L   TCO2 23 22 - 32 mmol/L   Hemoglobin 13.3 13.0 - 17.0 g/dL   HCT 39.0 39.0 - 52.0 %  POC SARS Coronavirus 2 Ag-ED - Nasal Swab (BD Veritor Kit)  Result Value Ref Range   SARS Coronavirus 2 Ag NEGATIVE NEGATIVE      Assessment & Plan:   Problem List Items Addressed This Visit      Cardiovascular and Mediastinum   Hypertension associated with diabetes (HCC)    Chronic, ongoing with BP below neuro goal <130/90.  Continue current medication regimen and adjust as needed.  CMP ordered today, but they were unable to obtain.  Recommend he monitor BP at home daily and document.      Relevant Medications   metFORMIN (GLUCOPHAGE) 500 MG tablet   Other Relevant Orders   CBC with Differential/Platelet     Respiratory    Asthma    Chronic, ongoing.  They were unable to afford Pulmicort, will return to Symbicort which he has used previously and is more cost effective for them.  Script sent.  Referral to pulmonary due to recent initiation of O2 and ongoing SOB after PNA.      Relevant Medications   budesonide-formoterol (SYMBICORT) 160-4.5 MCG/ACT inhaler   albuterol (PROVENTIL) (2.5 MG/3ML) 0.083% nebulizer solution   Other Relevant Orders   Ambulatory referral to Pulmonology   Bilateral pneumonia    Improving, but not 100% and now dependent on O2 with ongoing SOB with exertion.  Will place referral to pulmonary for further testing and recommendations, as has underlying asthma.  Repeat CXR in and around April 15th (6 weeks).  Return in 4 weeks for follow-up.      Relevant Medications   budesonide-formoterol (SYMBICORT) 160-4.5 MCG/ACT inhaler   albuterol (PROVENTIL) (2.5 MG/3ML) 0.083% nebulizer solution   Other Relevant Orders   Ambulatory referral to Pulmonology     Endocrine   Uncontrolled type 2 diabetes mellitus with hyperglycemia, without long-term current use of insulin (HCC) - Primary    Chronic, ongoing with A1C today 9.2%, upward trend + last urine ALB 10 and A:C <30.  Not at neuro goal of <6.5%.  Increase Metformin to 1000 MG BID and continue Jardiance 10 MG, will increase this if ongoing high BS at home.  Recommend he check BS twice daily.  Return in 4 weeks.      Relevant Medications   metFORMIN (GLUCOPHAGE) 500 MG tablet   Other Relevant Orders   Microalbumin,  Urine Waived   Hyperlipidemia associated with type 2 diabetes mellitus (HCC)    Chronic, ongoing.  Continue ASA and maintaining tight lipid control, may need to increase Atorvastatin to 80 MG.  Lipid panel outpatient, as unable to obtain today.      Relevant Medications   metFORMIN (GLUCOPHAGE) 500 MG tablet   Other Relevant Orders   Comprehensive metabolic panel   Lipid Panel w/o Chol/HDL Ratio     Nervous and Auditory    Acute encephalopathy    Continue Keppra and collaboration with neurology.        Other   History of prostate cancer    Continue collaboration with urology.      Vitamin D deficiency    Chronic, ongoing.  Continue daily supplement and check Vit D level today.      Relevant Orders   VITAMIN D 25 Hydroxy (Vit-D Deficiency, Fractures)   History of CVA (cerebrovascular accident)    Continue collaboration with neurology due to significant stroke history.  Continue current medication regimen.          Follow up plan: Return in about 4 weeks (around 10/17/2019) for T2DM and Mood.

## 2019-09-20 DIAGNOSIS — I2510 Atherosclerotic heart disease of native coronary artery without angina pectoris: Secondary | ICD-10-CM | POA: Diagnosis not present

## 2019-09-20 DIAGNOSIS — J189 Pneumonia, unspecified organism: Secondary | ICD-10-CM | POA: Diagnosis not present

## 2019-09-23 ENCOUNTER — Other Ambulatory Visit: Payer: Self-pay | Admitting: Pharmacy Technician

## 2019-09-23 DIAGNOSIS — M1990 Unspecified osteoarthritis, unspecified site: Secondary | ICD-10-CM | POA: Diagnosis not present

## 2019-09-23 DIAGNOSIS — Z96653 Presence of artificial knee joint, bilateral: Secondary | ICD-10-CM | POA: Diagnosis not present

## 2019-09-23 DIAGNOSIS — E1151 Type 2 diabetes mellitus with diabetic peripheral angiopathy without gangrene: Secondary | ICD-10-CM | POA: Diagnosis not present

## 2019-09-23 DIAGNOSIS — I639 Cerebral infarction, unspecified: Secondary | ICD-10-CM | POA: Diagnosis not present

## 2019-09-23 DIAGNOSIS — Z7984 Long term (current) use of oral hypoglycemic drugs: Secondary | ICD-10-CM | POA: Diagnosis not present

## 2019-09-23 DIAGNOSIS — Z8701 Personal history of pneumonia (recurrent): Secondary | ICD-10-CM | POA: Diagnosis not present

## 2019-09-23 DIAGNOSIS — J449 Chronic obstructive pulmonary disease, unspecified: Secondary | ICD-10-CM | POA: Diagnosis not present

## 2019-09-23 DIAGNOSIS — G4733 Obstructive sleep apnea (adult) (pediatric): Secondary | ICD-10-CM | POA: Diagnosis not present

## 2019-09-23 DIAGNOSIS — E785 Hyperlipidemia, unspecified: Secondary | ICD-10-CM | POA: Diagnosis not present

## 2019-09-23 DIAGNOSIS — Z8546 Personal history of malignant neoplasm of prostate: Secondary | ICD-10-CM | POA: Diagnosis not present

## 2019-09-23 DIAGNOSIS — K219 Gastro-esophageal reflux disease without esophagitis: Secondary | ICD-10-CM | POA: Diagnosis not present

## 2019-09-23 DIAGNOSIS — Z7982 Long term (current) use of aspirin: Secondary | ICD-10-CM | POA: Diagnosis not present

## 2019-09-23 DIAGNOSIS — Z955 Presence of coronary angioplasty implant and graft: Secondary | ICD-10-CM | POA: Diagnosis not present

## 2019-09-23 DIAGNOSIS — I2510 Atherosclerotic heart disease of native coronary artery without angina pectoris: Secondary | ICD-10-CM | POA: Diagnosis not present

## 2019-09-23 DIAGNOSIS — I6930 Unspecified sequelae of cerebral infarction: Secondary | ICD-10-CM | POA: Diagnosis not present

## 2019-09-23 DIAGNOSIS — I872 Venous insufficiency (chronic) (peripheral): Secondary | ICD-10-CM | POA: Diagnosis not present

## 2019-09-23 DIAGNOSIS — Z7951 Long term (current) use of inhaled steroids: Secondary | ICD-10-CM | POA: Diagnosis not present

## 2019-09-23 DIAGNOSIS — Z9181 History of falling: Secondary | ICD-10-CM | POA: Diagnosis not present

## 2019-09-23 DIAGNOSIS — I10 Essential (primary) hypertension: Secondary | ICD-10-CM | POA: Diagnosis not present

## 2019-09-23 DIAGNOSIS — I69328 Other speech and language deficits following cerebral infarction: Secondary | ICD-10-CM | POA: Diagnosis not present

## 2019-09-23 DIAGNOSIS — Z9981 Dependence on supplemental oxygen: Secondary | ICD-10-CM | POA: Diagnosis not present

## 2019-09-23 NOTE — Patient Outreach (Signed)
[facility]) Care Management  09/23/2019  BUEFORD DORFMAN [birth year] [id number]   Successful call placed to patient regarding patient assistance medication delivery of Jardiance from [facility], HIPAA identifiers verified.   Spoke to patient's wife [name], HIPAA confirmed and verified. [name] informed patient  did receive his initial shipment of Jardiance while awaiting his LIS/Extra Help determination from [employer]. [employer] helped patient fill the form out online on 08/24/2019. [name] informed they have not received a determination letter as of today, it can take 4-6 weeks to be received. Informed [name] that [employer] patient assistance company will need a copy of that letter in order to extend his enrollment. Informed [name] to contact myself or [employer] Catie Darnelle Maffucci when the letter is received. [name] verbalized understanding and confirmed having both our contact information.  Follow up:  Will follow up with patient in 4 weeks to inquire if determination letter has been received.  Maryelizabeth Eberle P. Mechel Haggard, [place]  [phone]

## 2019-09-23 NOTE — Telephone Encounter (Signed)
Will you schedule a telehealth visit to discuss timing of his prostate biopsy?

## 2019-09-26 ENCOUNTER — Ambulatory Visit (INDEPENDENT_AMBULATORY_CARE_PROVIDER_SITE_OTHER): Payer: PPO | Admitting: General Practice

## 2019-09-26 DIAGNOSIS — G4733 Obstructive sleep apnea (adult) (pediatric): Secondary | ICD-10-CM | POA: Diagnosis not present

## 2019-09-26 DIAGNOSIS — J4520 Mild intermittent asthma, uncomplicated: Secondary | ICD-10-CM | POA: Diagnosis not present

## 2019-09-26 DIAGNOSIS — E1159 Type 2 diabetes mellitus with other circulatory complications: Secondary | ICD-10-CM

## 2019-09-26 DIAGNOSIS — Z7982 Long term (current) use of aspirin: Secondary | ICD-10-CM | POA: Diagnosis not present

## 2019-09-26 DIAGNOSIS — I2510 Atherosclerotic heart disease of native coronary artery without angina pectoris: Secondary | ICD-10-CM | POA: Diagnosis not present

## 2019-09-26 DIAGNOSIS — M1990 Unspecified osteoarthritis, unspecified site: Secondary | ICD-10-CM | POA: Diagnosis not present

## 2019-09-26 DIAGNOSIS — E785 Hyperlipidemia, unspecified: Secondary | ICD-10-CM | POA: Diagnosis not present

## 2019-09-26 DIAGNOSIS — K219 Gastro-esophageal reflux disease without esophagitis: Secondary | ICD-10-CM | POA: Diagnosis not present

## 2019-09-26 DIAGNOSIS — Z7984 Long term (current) use of oral hypoglycemic drugs: Secondary | ICD-10-CM | POA: Diagnosis not present

## 2019-09-26 DIAGNOSIS — E1169 Type 2 diabetes mellitus with other specified complication: Secondary | ICD-10-CM | POA: Diagnosis not present

## 2019-09-26 DIAGNOSIS — Z9981 Dependence on supplemental oxygen: Secondary | ICD-10-CM | POA: Diagnosis not present

## 2019-09-26 DIAGNOSIS — J449 Chronic obstructive pulmonary disease, unspecified: Secondary | ICD-10-CM | POA: Diagnosis not present

## 2019-09-26 DIAGNOSIS — J189 Pneumonia, unspecified organism: Secondary | ICD-10-CM

## 2019-09-26 DIAGNOSIS — Z7951 Long term (current) use of inhaled steroids: Secondary | ICD-10-CM | POA: Diagnosis not present

## 2019-09-26 DIAGNOSIS — I639 Cerebral infarction, unspecified: Secondary | ICD-10-CM | POA: Diagnosis not present

## 2019-09-26 DIAGNOSIS — I69328 Other speech and language deficits following cerebral infarction: Secondary | ICD-10-CM | POA: Diagnosis not present

## 2019-09-26 DIAGNOSIS — I10 Essential (primary) hypertension: Secondary | ICD-10-CM

## 2019-09-26 DIAGNOSIS — Z9181 History of falling: Secondary | ICD-10-CM | POA: Diagnosis not present

## 2019-09-26 DIAGNOSIS — Z8701 Personal history of pneumonia (recurrent): Secondary | ICD-10-CM | POA: Diagnosis not present

## 2019-09-26 DIAGNOSIS — E1151 Type 2 diabetes mellitus with diabetic peripheral angiopathy without gangrene: Secondary | ICD-10-CM | POA: Diagnosis not present

## 2019-09-26 DIAGNOSIS — I872 Venous insufficiency (chronic) (peripheral): Secondary | ICD-10-CM | POA: Diagnosis not present

## 2019-09-26 DIAGNOSIS — I6930 Unspecified sequelae of cerebral infarction: Secondary | ICD-10-CM | POA: Diagnosis not present

## 2019-09-26 DIAGNOSIS — E1165 Type 2 diabetes mellitus with hyperglycemia: Secondary | ICD-10-CM

## 2019-09-26 DIAGNOSIS — Z8546 Personal history of malignant neoplasm of prostate: Secondary | ICD-10-CM | POA: Diagnosis not present

## 2019-09-26 DIAGNOSIS — Z96653 Presence of artificial knee joint, bilateral: Secondary | ICD-10-CM | POA: Diagnosis not present

## 2019-09-26 DIAGNOSIS — Z955 Presence of coronary angioplasty implant and graft: Secondary | ICD-10-CM | POA: Diagnosis not present

## 2019-09-26 NOTE — Chronic Care Management (AMB) (Signed)
Chronic Care Management   Follow Up Note   09/26/2019 Name: Andrew Cobb MRN: [id number] DOB: Mar 26, 1951  Referred by: Guadalupe Maple, MD Reason for referral : Chronic Care Management (post hospitalization: Chronic conditions )   KEALON BOOR is a 69 y.o. year old male who is a primary care patient of Crissman, Jeannette How, MD. The CCM team was consulted for assistance with chronic disease management and care coordination needs.    Review of patient status, including review of consultants reports, relevant laboratory and other test results, and collaboration with appropriate care team members and the patient's provider was performed as part of comprehensive patient evaluation and provision of chronic care management services.    SDOH (Social Determinants of Health) assessments performed: Yes See Care Plan activities for detailed interventions related to SDOH)  SDOH Interventions     Most Recent Value  SDOH Interventions  SDOH Interventions for the Following Domains  Financial Strain  Financial Strain Interventions  Other (Comment) [working with care guides for help and support]       Outpatient Encounter Medications as of 09/26/2019  Medication Sig   albuterol (PROVENTIL) (2.5 MG/3ML) 0.083% nebulizer solution Take 3 mLs (2.5 mg total) by nebulization every 6 (six) hours as needed for wheezing or shortness of breath.   albuterol (VENTOLIN HFA) 108 (90 Base) MCG/ACT inhaler Inhale 2 puffs into the lungs every 6 (six) hours as needed for wheezing or shortness of breath.   amitriptyline (ELAVIL) 25 MG tablet Take 1 tablet (25 mg total) by mouth at bedtime.   aspirin EC 81 MG EC tablet Take 1 tablet (81 mg total) by mouth daily.   atorvastatin (LIPITOR) 40 MG tablet Take 1 tablet (40 mg total) by mouth at bedtime.   benazepril (LOTENSIN) 40 MG tablet Take 1 tablet (40 mg total) by mouth daily.   budesonide-formoterol (SYMBICORT) 160-4.5 MCG/ACT inhaler Inhale 2 puffs into the  lungs 2 (two) times daily.   cetirizine (ZYRTEC) 10 MG tablet Take 10 mg by mouth daily.   Cholecalciferol (VITAMIN D) 2000 units CAPS Take 2,000 Units by mouth daily.   empagliflozin (JARDIANCE) 10 MG TABS tablet Take 10 mg by mouth daily before breakfast.   fluticasone (FLONASE) 50 MCG/ACT nasal spray Place 1 spray into the nose 2 (two) times daily as needed for congestion.   glucose blood test strip 1 each by Other route 2 (two) times daily. DX E11.9   Lancets (ONETOUCH ULTRASOFT) lancets 1 each by Other route 2 (two) times daily. Dx E11.9   levETIRAcetam (KEPPRA) 500 MG tablet Take 1 tablet (500 mg total) by mouth 2 (two) times daily.   Magnesium 250 MG TABS Take 250 mg by mouth at bedtime.    metFORMIN (GLUCOPHAGE) 500 MG tablet Take 2 tablets (1,000 mg total) by mouth 2 (two) times daily with a meal.   Multiple Vitamins-Minerals (MULTIVITAMIN PO) Take 1 tablet by mouth daily.    valACYclovir (VALTREX) 500 MG tablet Take 1 tablet (500 mg total) by mouth daily.   No facility-administered encounter medications on file as of 09/26/2019.     Objective:   Goals Addressed            This Visit's Progress    RNCM: "I am feeling better since getting out of the hospital" (pt-stated)       [place] (see longtitudinal plan of care for additional care plan information)  Current Barriers:   Chronic Disease Management support, education, and care coordination  needs related to HTN, HLD, DMII, and Pulmonary Disease  Clinical Goal(s) related to HTN, HLD, DMII, and Pulmonary Disease:  Over the next 120 days, patient will:   Work with the care management team to address educational, disease management, and care coordination needs   Begin or continue self health monitoring activities as directed today Measure and record cbg (blood glucose) 2 times daily, Measure and record blood pressure 5 times per week, and adhere to a Heart healthy/ADA diet  Call provider office for new  or worsened signs and symptoms Blood glucose findings outside established parameters, Blood pressure findings outside established parameters, Oxygen saturation lower than established parameter, Shortness of breath, and New or worsened symptom related to recent hospitalization due to pneumonia and the need to come home on supplemental oxygen  Call care management team with questions or concerns  Verbalize basic understanding of patient centered plan of care established today  Interventions related to HTN, HLD, DMII, and Pulmonary Disease:   Evaluation of current treatment plans and patient's adherence to plan as established by provider. Patient saw pcp 09/19/2019 post discharge from the hospital. The patient states he is doing much better and is not even having to use the oxygen as much. His oxygen sats are staying 95 to 98%.  Is using the oxygen 2 liters via [place] at night. The patient also verbalized that his blood sugars have come down. States they have been in the 120's to 130's.  Patient verbalized his blood pressure is "great".   Assessed patient understanding of disease states: Patient states he understands his conditions well and is compliant with plan of care and medication regimen   Assessed patient's education and care coordination needs- working with CCM pharmacist. Denies any needs at this time  Provided disease specific education to patient: Education and support given on Heart healthy/ADA diet. The patient states he does not eat salt.   Collaborated with appropriate clinical care team members regarding patient needs  Patient Self Care Activities related to HTN, HLD, DMII, and Pulmonary Disease:   Patient is unable to independently self-manage chronic health conditions  Initial goal documentation         Plan:   The care management team will reach out to the patient again over the next 60 days.    Noreene Larsson RN, MSN, [facility] Mobile: [phone]

## 2019-09-26 NOTE — Telephone Encounter (Signed)
IT HAS BEEN SCHEDULED FOR 09-28-19 @ 8:00

## 2019-09-26 NOTE — Patient Instructions (Signed)
Visit Information  Goals Addressed            This Visit's Progress    RNCM: "I am feeling better since getting out of the hospital" (pt-stated)       [place] (see longtitudinal plan of care for additional care plan information)  Current Barriers:   Chronic Disease Management support, education, and care coordination needs related to HTN, HLD, DMII, and Pulmonary Disease  Clinical Goal(s) related to HTN, HLD, DMII, and Pulmonary Disease:  Over the next 120 days, patient will:   Work with the care management team to address educational, disease management, and care coordination needs   Begin or continue self health monitoring activities as directed today Measure and record cbg (blood glucose) 2 times daily, Measure and record blood pressure 5 times per week, and adhere to a Heart healthy/ADA diet  Call provider office for new or worsened signs and symptoms Blood glucose findings outside established parameters, Blood pressure findings outside established parameters, Oxygen saturation lower than established parameter, Shortness of breath, and New or worsened symptom related to recent hospitalization due to pneumonia and the need to come home on supplemental oxygen  Call care management team with questions or concerns  Verbalize basic understanding of patient centered plan of care established today  Interventions related to HTN, HLD, DMII, and Pulmonary Disease:   Evaluation of current treatment plans and patient's adherence to plan as established by provider. Patient saw pcp 09/19/2019 post discharge from the hospital. The patient states he is doing much better and is not even having to use the oxygen as much. His oxygen sats are staying 95 to 98%.  Is using the oxygen 2 liters via [place] at night. The patient also verbalized that his blood sugars have come down. States they have been in the 120's to 130's.  Patient verbalized his blood pressure is "great".   Assessed patient  understanding of disease states: Patient states he understands his conditions well and is compliant with plan of care and medication regimen   Assessed patient's education and care coordination needs- working with CCM pharmacist. Denies any needs at this time  Provided disease specific education to patient: Education and support given on Heart healthy/ADA diet. The patient states he does not eat salt.   Collaborated with appropriate clinical care team members regarding patient needs  Patient Self Care Activities related to HTN, HLD, DMII, and Pulmonary Disease:   Patient is unable to independently self-manage chronic health conditions  Initial goal documentation        Patient verbalizes understanding of instructions provided today.   The care management team will reach out to the patient again over the next 60 days.   Noreene Larsson RN, MSN, [facility] Mobile: [phone]

## 2019-09-28 ENCOUNTER — Other Ambulatory Visit: Payer: Self-pay

## 2019-09-28 ENCOUNTER — Telehealth (INDEPENDENT_AMBULATORY_CARE_PROVIDER_SITE_OTHER): Payer: PPO | Admitting: Urology

## 2019-09-28 ENCOUNTER — Telehealth: Payer: Self-pay | Admitting: *Deleted

## 2019-09-28 ENCOUNTER — Telehealth: Payer: Self-pay | Admitting: Urology

## 2019-09-28 DIAGNOSIS — C61 Malignant neoplasm of prostate: Secondary | ICD-10-CM | POA: Diagnosis not present

## 2019-09-28 NOTE — Telephone Encounter (Signed)
Based on his medical history I think the risks of surgery may outweigh the benefits.  He would be at increased risk of CVA under anesthesia.  The effectiveness of radiation versus surgery for prostate cancer is considered equal.  If he was interested in pursuing surgery  would recommend referral to [facility] or [facility].

## 2019-09-28 NOTE — Telephone Encounter (Signed)
Notified patient as instructed, Patient would like to get referral to [facility].

## 2019-09-28 NOTE — Telephone Encounter (Signed)
-----   Message from Abbie Sons, MD sent at 09/28/2019 10:05 AM EDT ----- Regarding: Biopsy Can cancel fusion biopsy on this patient.  He was already on active surveillance for prostate cancer.  I am going to refer to radiation oncology for treatment

## 2019-09-28 NOTE — Progress Notes (Signed)
Virtual Visit via Telephone Note  I connected with Andrew Cobb on 09/28/19 at  8:00 AM EDT by telephone and verified that I am speaking with the correct person using two identifiers.  Location: Patient: Home Provider: [facility]   I discussed the limitations, risks, security and privacy concerns of performing an evaluation and management service by telephone and the availability of in person appointments. I also discussed with the patient that there may be a patient responsible charge related to this service. The patient expressed understanding and agreed to proceed.   History of Present Illness: I spoke this morning with Andrew Cobb who is on Andrew Cobb's DPR.  He has a rising PSA and abnormal prostate MRI.  A fusion biopsy was recommended.  He was hospitalized from 3/3-3/12 with an acute CVA.  He was also found to be hypoxic with felt suspicious for aspiration pneumonitis.  He was discharged on home oxygen therapy.  He is not taking an anticoagulant but is on ASA 81 mg daily.  His wife states he is recovering well.  His oxygen has been decreased from 4 L - 2 L.  He was recently discharged from physical therapy.  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  He has been on active surveillance for low risk prostate cancer that was diagnosed in 2018.   Observations/Objective: N/A  Assessment and Plan: 69 y.o. male with multiple comorbidities and recent CVA.  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  With his recent CVA I would not recommend proceeding with fusion biopsy as I do not think it would change the plan.  With evidence of extracapsular extension will refer to radiation oncology for consideration of definitive treatment.  Follow Up Instructions: Radiation oncology referral entered.   I  discussed the assessment and treatment plan with the patient. The patient was provided an opportunity to ask questions and all were answered. The patient agreed with the plan and demonstrated an understanding of the instructions.   The patient was advised to call back or seek an in-person evaluation if the symptoms worsen or if the condition fails to improve as anticipated.  I provided 12 minutes of non-face-to-face time during this encounter.   Abbie Sons, MD

## 2019-09-28 NOTE — Telephone Encounter (Signed)
Called and left Tammy @ [employer] a message to CX his fusion BX referral   Sharyn Lull

## 2019-09-28 NOTE — Telephone Encounter (Signed)
Called to set up consult appointment with Dr. Baruch Gouty.   Wife declined the appointment stating they are waiting for a referral to [facility] for surgery.

## 2019-09-28 NOTE — Telephone Encounter (Signed)
Patient's wife called the office today after the televisit with Dr. Bernardo Heater.  She has spoken to her husband regarding treatment options and they want to know if surgery is an option.    Please advise if you want to call them back or set up an office visit to discuss.

## 2019-09-29 ENCOUNTER — Telehealth: Payer: Self-pay | Admitting: Urology

## 2019-09-29 ENCOUNTER — Ambulatory Visit (INDEPENDENT_AMBULATORY_CARE_PROVIDER_SITE_OTHER): Payer: PPO | Admitting: *Deleted

## 2019-09-29 DIAGNOSIS — I639 Cerebral infarction, unspecified: Secondary | ICD-10-CM | POA: Diagnosis not present

## 2019-09-29 LAB — CUP PACEART REMOTE DEVICE CHECK
Date Time Interrogation Session: 20210325015649
Implantable Pulse Generator Implant Date: 20200316

## 2019-09-29 NOTE — Telephone Encounter (Signed)
Pt wife calling asking to have you call the pt, he is more alert since stroke and would like to hear the results himself to make decision of how he would like to proceed. Pt can be reached at [phone] or his cell at [phone]

## 2019-09-30 ENCOUNTER — Telehealth: Payer: Self-pay | Admitting: Family Medicine

## 2019-09-30 NOTE — Telephone Encounter (Signed)
Di Kindle from [facility] called and stated that the Pt will miss his speech therapy eval and they will move it to next week

## 2019-09-30 NOTE — Telephone Encounter (Signed)
Noted, thank you

## 2019-09-30 NOTE — Progress Notes (Signed)
ILR Remote 

## 2019-10-03 ENCOUNTER — Other Ambulatory Visit: Payer: Self-pay | Admitting: Pharmacy Technician

## 2019-10-03 ENCOUNTER — Telehealth: Payer: Self-pay | Admitting: Family Medicine

## 2019-10-03 DIAGNOSIS — Z961 Presence of intraocular lens: Secondary | ICD-10-CM | POA: Diagnosis not present

## 2019-10-03 DIAGNOSIS — H353122 Nonexudative age-related macular degeneration, left eye, intermediate dry stage: Secondary | ICD-10-CM | POA: Diagnosis not present

## 2019-10-03 DIAGNOSIS — H26492 Other secondary cataract, left eye: Secondary | ICD-10-CM | POA: Diagnosis not present

## 2019-10-03 DIAGNOSIS — H35371 Puckering of macula, right eye: Secondary | ICD-10-CM | POA: Diagnosis not present

## 2019-10-03 DIAGNOSIS — E119 Type 2 diabetes mellitus without complications: Secondary | ICD-10-CM | POA: Diagnosis not present

## 2019-10-03 DIAGNOSIS — H353212 Exudative age-related macular degeneration, right eye, with inactive choroidal neovascularization: Secondary | ICD-10-CM | POA: Diagnosis not present

## 2019-10-03 LAB — HM DIABETES EYE EXAM

## 2019-10-03 NOTE — Telephone Encounter (Signed)
Paper work for pharmacist was brought in today, left in pharmacist folders in the back.

## 2019-10-03 NOTE — Patient Outreach (Signed)
[place] [facility]) Care Management  10/03/2019  Andrew Cobb Jul 24, 1950 [id number]    Unsuccessful outreach call placed to patient in regards to voicemail left for a return call concerning BI application for Jardiance.  In the voicemail, the patient's wife indicated she had received the LIS denial letter and was inquiring if she could drop it by the office.  Left patient a HIPAA compliant voicemail indicating the information could be left at the front office with [name] after consulting with embedded [name]. Also left my return number.  Will await required documents.Once received will submit to [name]. [name], [place]  [phone]

## 2019-10-04 ENCOUNTER — Telehealth: Payer: Self-pay | Admitting: Family Medicine

## 2019-10-04 MED ORDER — ONETOUCH ULTRASOFT LANCETS MISC: 1.0000 | Freq: Two times a day (BID) | 11 refills | Status: DC

## 2019-10-04 NOTE — Telephone Encounter (Signed)
Can this be refilled? 

## 2019-10-04 NOTE — Telephone Encounter (Signed)
Pt's spouse is request a refill for Lancets (ONETOUCH ULTRASOFT) lancets on pt's behalf.   Pharmacy:  [facility] [name] - [address] Phone:  [phone]  Fax:  [phone]

## 2019-10-04 NOTE — Telephone Encounter (Signed)
Rx refilled.

## 2019-10-05 DIAGNOSIS — E785 Hyperlipidemia, unspecified: Secondary | ICD-10-CM | POA: Diagnosis not present

## 2019-10-05 DIAGNOSIS — Z7951 Long term (current) use of inhaled steroids: Secondary | ICD-10-CM | POA: Diagnosis not present

## 2019-10-05 DIAGNOSIS — I639 Cerebral infarction, unspecified: Secondary | ICD-10-CM | POA: Diagnosis not present

## 2019-10-05 DIAGNOSIS — I10 Essential (primary) hypertension: Secondary | ICD-10-CM | POA: Diagnosis not present

## 2019-10-05 DIAGNOSIS — Z8546 Personal history of malignant neoplasm of prostate: Secondary | ICD-10-CM | POA: Diagnosis not present

## 2019-10-05 DIAGNOSIS — I6930 Unspecified sequelae of cerebral infarction: Secondary | ICD-10-CM | POA: Diagnosis not present

## 2019-10-05 DIAGNOSIS — K219 Gastro-esophageal reflux disease without esophagitis: Secondary | ICD-10-CM | POA: Diagnosis not present

## 2019-10-05 DIAGNOSIS — Z96653 Presence of artificial knee joint, bilateral: Secondary | ICD-10-CM | POA: Diagnosis not present

## 2019-10-05 DIAGNOSIS — E1151 Type 2 diabetes mellitus with diabetic peripheral angiopathy without gangrene: Secondary | ICD-10-CM | POA: Diagnosis not present

## 2019-10-05 DIAGNOSIS — I69328 Other speech and language deficits following cerebral infarction: Secondary | ICD-10-CM | POA: Diagnosis not present

## 2019-10-05 DIAGNOSIS — J449 Chronic obstructive pulmonary disease, unspecified: Secondary | ICD-10-CM | POA: Diagnosis not present

## 2019-10-05 DIAGNOSIS — Z8701 Personal history of pneumonia (recurrent): Secondary | ICD-10-CM | POA: Diagnosis not present

## 2019-10-05 DIAGNOSIS — Z9981 Dependence on supplemental oxygen: Secondary | ICD-10-CM | POA: Diagnosis not present

## 2019-10-05 DIAGNOSIS — G4733 Obstructive sleep apnea (adult) (pediatric): Secondary | ICD-10-CM | POA: Diagnosis not present

## 2019-10-05 DIAGNOSIS — I2510 Atherosclerotic heart disease of native coronary artery without angina pectoris: Secondary | ICD-10-CM | POA: Diagnosis not present

## 2019-10-05 DIAGNOSIS — I872 Venous insufficiency (chronic) (peripheral): Secondary | ICD-10-CM | POA: Diagnosis not present

## 2019-10-05 DIAGNOSIS — Z7982 Long term (current) use of aspirin: Secondary | ICD-10-CM | POA: Diagnosis not present

## 2019-10-05 DIAGNOSIS — Z9181 History of falling: Secondary | ICD-10-CM | POA: Diagnosis not present

## 2019-10-05 DIAGNOSIS — Z955 Presence of coronary angioplasty implant and graft: Secondary | ICD-10-CM | POA: Diagnosis not present

## 2019-10-05 DIAGNOSIS — M1990 Unspecified osteoarthritis, unspecified site: Secondary | ICD-10-CM | POA: Diagnosis not present

## 2019-10-05 DIAGNOSIS — Z7984 Long term (current) use of oral hypoglycemic drugs: Secondary | ICD-10-CM | POA: Diagnosis not present

## 2019-10-06 ENCOUNTER — Other Ambulatory Visit: Payer: Self-pay | Admitting: Pharmacy Technician

## 2019-10-06 NOTE — Patient Outreach (Signed)
[facility]) Care Management  10/06/2019  Andrew Cobb [birth year] [id number]    Received patient's denial letter from social security for LIS/Extra Help.  Submitted document to BI in regards to [employer] application.  Will follow up with BI in 5-15 business days.  Andrew Cobb P. Leasia Swann, [place]  [phone]

## 2019-10-07 ENCOUNTER — Institutional Professional Consult (permissible substitution): Payer: PPO | Admitting: Radiation Oncology

## 2019-10-11 ENCOUNTER — Ambulatory Visit (INDEPENDENT_AMBULATORY_CARE_PROVIDER_SITE_OTHER): Payer: PPO | Admitting: Adult Health

## 2019-10-11 ENCOUNTER — Other Ambulatory Visit: Payer: Self-pay

## 2019-10-11 ENCOUNTER — Encounter: Payer: Self-pay | Admitting: Adult Health

## 2019-10-11 ENCOUNTER — Telehealth: Payer: Self-pay | Admitting: Radiology

## 2019-10-11 VITALS — BP 112/63 | HR 80 | Temp 97.9°F | Ht 70.0 in | Wt 183.0 lb

## 2019-10-11 DIAGNOSIS — C61 Malignant neoplasm of prostate: Secondary | ICD-10-CM

## 2019-10-11 DIAGNOSIS — E119 Type 2 diabetes mellitus without complications: Secondary | ICD-10-CM

## 2019-10-11 DIAGNOSIS — R569 Unspecified convulsions: Secondary | ICD-10-CM | POA: Diagnosis not present

## 2019-10-11 DIAGNOSIS — I10 Essential (primary) hypertension: Secondary | ICD-10-CM | POA: Diagnosis not present

## 2019-10-11 DIAGNOSIS — I639 Cerebral infarction, unspecified: Secondary | ICD-10-CM | POA: Diagnosis not present

## 2019-10-11 DIAGNOSIS — E782 Mixed hyperlipidemia: Secondary | ICD-10-CM | POA: Diagnosis not present

## 2019-10-11 MED ORDER — LEVETIRACETAM 500 MG PO TABS: 500.0000 mg | ORAL_TABLET | Freq: Two times a day (BID) | ORAL | 3 refills | Status: DC

## 2019-10-11 NOTE — Patient Instructions (Signed)
Repeat EEG - you will schedule appointment today when you leave  Continue keppra 500mg  twice daily at this time for seizure prevention  No driving for 6 months unless instructed otherwise  Continue aspirin 81 mg daily  and lipitor  for secondary stroke prevention  Continue to follow up with PCP regarding cholesterol, blood pressure and diabetes management   Continue to monitor blood pressure at home  [facility] at [address], [place], [place] [address] Phone: [phone]  Maintain strict control of hypertension with blood pressure goal below 130/90, diabetes with hemoglobin A1c goal below 6.5% and cholesterol with LDL cholesterol (bad cholesterol) goal below 70 mg/dL. I also advised the patient to eat a healthy diet with plenty of whole grains, cereals, fruits and vegetables, exercise regularly and maintain ideal body weight.  Followup in the future with me in 3 months or call earlier if needed       Thank you for coming to see [place] at [facility]. I hope we have been able to provide you high quality care today.  You may receive a patient satisfaction survey over the next few weeks. We would appreciate your feedback and comments so that we may continue to improve ourselves and the health of our patients.

## 2019-10-11 NOTE — Telephone Encounter (Signed)
Urology is going to want him to also see radiation oncology.  Will schedule a multidisciplinary oncology appointment with both urology and radiation oncology.  The appointment will be the same day.  Referral was entered.

## 2019-10-11 NOTE — Telephone Encounter (Signed)
Patient's wife called stating patient would like to forgo radiation therapy and pursue prostatectomy instead. Would like a referral to [facility] for surgery.
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
## 2019-10-11 NOTE — Progress Notes (Signed)
I agree with the above plan 

## 2019-10-12 ENCOUNTER — Telehealth: Payer: Self-pay

## 2019-10-12 ENCOUNTER — Ambulatory Visit: Payer: Self-pay | Admitting: Pharmacist

## 2019-10-12 NOTE — Chronic Care Management (AMB) (Signed)
  Chronic Care Management   Note  10/12/2019 Name: Andrew Cobb MRN: [id number] DOB: May 04, 1951  Andrew Cobb is a 69 y.o. year old male who is a primary care patient of Crissman, Jeannette How, MD. The CCM team was consulted for assistance with chronic disease management and care coordination needs.    Attempted to contact patient for medication management review. Left HIPAA compliant message for patient to return my call at their convenience.   Plan: - Will collaborate with Care Guide to outreach to schedule follow up with me

## 2019-10-13 ENCOUNTER — Other Ambulatory Visit: Payer: Self-pay | Admitting: Pharmacy Technician

## 2019-10-13 ENCOUNTER — Telehealth: Payer: Self-pay | Admitting: Family Medicine

## 2019-10-13 NOTE — Patient Outreach (Signed)
[name] [facility]) Care Management  10/13/2019  Andrew Cobb 06-Jul-1951 [id number]    Care coordination call placed to BI in regards to patient's Jardiance application.  Spoke to Manuela Schwartz who informed patient was APPROVED 10/13/2019-07/06/2020. She informed a shipment would go out again on Monday 10/17/2019 and patient should receive it in 5-7 business days at his home address.  Will reach out to patient with this information.  [name], [place]  [phone]

## 2019-10-13 NOTE — Patient Outreach (Signed)
[place] [facility]) Care Management  10/13/2019  Andrew Cobb [birth year] [id number]  Successful outreach call placed to patient in regards to voicemail that patient's wife left for me referencing BI application for Jardiance.  Spoke to patient, HIPAA identifiers verified. He informed his wife was not home at the time and he was unsure of what she needed from me. While on the line with the patent, informed him that his enrollment was extended with BI and that when needed he could call them for his refills. Patient verbalized understanding and confirms having name and number. Patient informed he had no other questions at this time.  Will route note to embedded [facility] RPh Catie Darnelle Maffucci that patient assistance is completed and request she reiterate refill process to patient at next [facility] appointment. Will remove myself from care team.  [name]. [name], [place]  [phone]

## 2019-10-13 NOTE — Chronic Care Management (AMB) (Signed)
  Care Management   Note  10/13/2019 Name: DELYLE BOIKE MRN: [id number] DOB: 12-11-1950  MAIJOR GINES is a 69 y.o. year old male who is a primary care patient of Crissman, Jeannette How, MD and is actively engaged with the care management team. I reached out to [name] by phone today to assist with scheduling a follow up visit with the Pharmacist  Follow up plan: Telephone appointment with care management team member scheduled for:11/09/2019  Noreene Larsson, [place], [place], [place] [address] Direct Dial: [phone] [email or web address] Website: [email or web address]

## 2019-10-13 NOTE — Chronic Care Management (AMB) (Signed)
  Care Management   Note  10/13/2019 Name: ASPEN JHA MRN: [id number] DOB: Jun 23, 1951  AARIB CIMMINO is a 69 y.o. year old male who is a primary care patient of Crissman, Jeannette How, MD and is actively engaged with the care management team. I reached out to [name] by phone today to assist with re-scheduling a follow up visit with the Pharmacist  Follow up plan: Unsuccessful telephone outreach attempt made. A HIPPA compliant phone message was left for the patient providing contact information and requesting a return call.  The care management team will reach out to the patient again over the next 7 days.  If patient returns call to provider office, please advise to call [place]  at [place], [place], [place], [place] [address] Direct Dial: [phone] [email or web address]: [email or web address]

## 2019-10-14 ENCOUNTER — Other Ambulatory Visit: Payer: Self-pay

## 2019-10-14 ENCOUNTER — Encounter: Payer: Self-pay | Admitting: Internal Medicine

## 2019-10-14 ENCOUNTER — Ambulatory Visit (INDEPENDENT_AMBULATORY_CARE_PROVIDER_SITE_OTHER): Payer: PPO

## 2019-10-14 ENCOUNTER — Ambulatory Visit: Payer: PPO | Admitting: Internal Medicine

## 2019-10-14 DIAGNOSIS — J9601 Acute respiratory failure with hypoxia: Secondary | ICD-10-CM

## 2019-10-14 DIAGNOSIS — I152 Hypertension secondary to endocrine disorders: Secondary | ICD-10-CM

## 2019-10-14 DIAGNOSIS — R0609 Other forms of dyspnea: Secondary | ICD-10-CM

## 2019-10-14 DIAGNOSIS — I10 Essential (primary) hypertension: Secondary | ICD-10-CM

## 2019-10-14 DIAGNOSIS — R918 Other nonspecific abnormal finding of lung field: Secondary | ICD-10-CM | POA: Diagnosis not present

## 2019-10-14 DIAGNOSIS — R0600 Dyspnea, unspecified: Secondary | ICD-10-CM | POA: Diagnosis not present

## 2019-10-14 DIAGNOSIS — E1159 Type 2 diabetes mellitus with other circulatory complications: Secondary | ICD-10-CM

## 2019-10-14 DIAGNOSIS — R0602 Shortness of breath: Secondary | ICD-10-CM | POA: Diagnosis not present

## 2019-10-14 LAB — BASIC METABOLIC PANEL
BUN: 24 mg/dL — ABNORMAL HIGH (ref 6–23)
CO2: 27 mEq/L (ref 19–32)
Calcium: 9.8 mg/dL (ref 8.4–10.5)
Chloride: 97 mEq/L (ref 96–112)
Creatinine, Ser: 1.41 mg/dL (ref 0.40–1.50)
GFR: 49.88 mL/min — ABNORMAL LOW (ref >60.00)
Glucose, Bld: 168 mg/dL — ABNORMAL HIGH (ref 70–99)
Potassium: 4.4 mEq/L (ref 3.5–5.1)
Sodium: 134 mEq/L — ABNORMAL LOW (ref 135–145)

## 2019-10-14 LAB — CBC WITH DIFFERENTIAL/PLATELET
Basophils Absolute: 0.1 10*3/uL (ref 0.0–0.1)
Basophils Relative: 0.8 % (ref 0.0–3.0)
Eosinophils Absolute: 0.2 10*3/uL (ref 0.0–0.7)
Eosinophils Relative: 2.4 % (ref 0.0–5.0)
HCT: 42 % (ref 39.0–52.0)
Hemoglobin: 13.8 g/dL (ref 13.0–17.0)
Lymphocytes Relative: 14.2 % (ref 12.0–46.0)
Lymphs Abs: 1 10*3/uL (ref 0.7–4.0)
MCHC: 32.9 g/dL (ref 30.0–36.0)
MCV: 95.4 fl (ref 78.0–100.0)
Monocytes Absolute: 0.6 10*3/uL (ref 0.1–1.0)
Monocytes Relative: 9.5 % (ref 3.0–12.0)
Neutro Abs: 4.9 10*3/uL (ref 1.4–7.7)
Neutrophils Relative %: 73.1 % (ref 43.0–77.0)
Platelets: 207 10*3/uL (ref 150.0–400.0)
RBC: 4.4 Mil/uL (ref 4.22–5.81)
RDW: 16.1 % — ABNORMAL HIGH (ref 11.5–15.5)
WBC: 6.7 10*3/uL (ref 4.0–10.5)

## 2019-10-14 LAB — SEDIMENTATION RATE: Sed Rate: 12 mm/hr (ref 0–20)

## 2019-10-14 MED ORDER — TELMISARTAN 80 MG PO TABS: 80.0000 mg | ORAL_TABLET | Freq: Every day | ORAL | 2 refills | Status: DC

## 2019-10-14 NOTE — Assessment & Plan Note (Addendum)
Onset aroiund 2018 on ACEi attributed to asthma > d/c'd ACEi 10/14/2019  - 10/14/2019   Walked RA x  3 laps =  approx 766ft @ moderate pace - stopped due to end of study  with sats of 90 % at the end of the study and no sob   DDX of  difficult airways management almost all start with A and  include Adherence, Ace Inhibitors, Acid Reflux, Active Sinus Disease, Alpha 1 Antitripsin deficiency, Anxiety masquerading as Airways dz,  ABPA,  Allergy(esp in young), Aspiration (esp in elderly), Adverse effects of meds,  Active smoking or vaping, A bunch of PE's (a small clot burden can't cause this syndrome unless there is already severe underlying pulm or vascular dz with poor reserve) plus two Bs  = Bronchiectasis and Beta blocker use..and one C= CHF   Adherence is always the initial "prime suspect" and is a multilayered concern that requires a "trust but verify" approach in every patient - starting with knowing how to use medications, especially inhalers, correctly, keeping up with refills and understanding the fundamental difference between maintenance and prns vs those medications only taken for a very short course and then stopped and not refilled.  - return with all meds in hand using a trust but verify approach to confirm accurate Medication  Reconciliation The principal here is that until we are certain that the  patients are doing what we've asked, it makes no sense to ask them to do more.   ACEi adverse effects at the  top of the usual list of suspects and the only way to rule it out is a trial off > see a/p    ? Asthma/copd > strongly doubt  >> for now continue symb 160 but ok to taper to one bid if doing well and f/u in 3 weeks   ? adverse drug effects > none of the other usual suspects listed   - The proper method of use, as well as anticipated side effects, of a metered-dose inhaler are discussed and demonstrated to the patient. Improved effectiveness after extensive coaching during this visit to a  level of approximately 75 % from a baseline of 25 % > ok to continue symb 160 2bid but low threshold to taper to 1 puff bid as we don't have much evidence of true asthma here and may be able to taper off at f/u.

## 2019-10-14 NOTE — Patient Instructions (Addendum)
Goal for 02 is to keep saturation above 90%   Plan A = Automatic = Always=    Symbicort 160 Take 1-2 puffs first thing in am and then another 2 puffs about 12 hours later.   Work on inhaler technique:  relax and gently blow all the way out then take a nice smooth deep breath back in, triggering the inhaler at same time you start breathing in.  Hold for up to 5 seconds if you can. Blow out thru nose. Rinse and gargle with water when done     Plan B = Backup (to supplement plan A, not to replace it) Only use your albuterol inhaler as a rescue medication to be used if you can't catch your breath by resting or doing a relaxed purse lip breathing pattern.  - The less you use it, the better it will work when you need it. - Ok to use the inhaler up to 2 puffs  every 4 hours if you must but call for appointment if use goes up over your usual need - Don't leave home without it !!  (think of it like the spare tire for your car)   Plan C = Crisis (instead of Plan B but only if Plan B stops working) - only use your albuterol nebulizer if you first try Plan B and it fails to help > ok to use the nebulizer up to every 4 hours but if start needing it regularly call for immediate appointment   Stop benazpril and start telmasartan 80 mg daily - take one half if too low   Please remember to go to the lab and x-ray department   for your tests - we will call you with the results when they are available.     Please schedule a follow up office visit in 3 weeks, sooner if needed  with all medications /inhalers/ solutions in hand so we can verify exactly what you are taking. This includes all medications from all doctors and over the counters                 .

## 2019-10-14 NOTE — Progress Notes (Signed)
Andrew Cobb, male    DOB: 1950/09/23,    MRN: [id number]   Brief patient profile:  69 yowm quit smoking 1981 with no resp problems at all but around 2018-19 while on lisinopril started needing inhalers for colds then symbicort started 2020 then admitted 09/07/19:    Admit date: 09/07/2019 Discharge date: 09/16/2019  Admitted From: Home Disposition: Home with home health  Recommendations for Outpatient Follow-up:  1. Follow up with PCP in 1-2 weeks 2. Please obtain BMP/CBC in one week your next doctors visit.  3. Keppra 500 mg twice daily 4. Follow-up patient neurology in 4 weeks 5. Recommended against driving, instructions have been given to him.  He is not allowed to drive at least until seen by outpatient neurology and cleared by them. 6. Supplemental oxygen for home has been arranged for 7. 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 MRI showed acute CVA along with chronic changes, neurology team was consulted. There was also concerns of seizure therefore started on Keppra. CT of the chest without contrast showed groundglass opacity concerns for pneumonitis/pneumonia.  Despite of several days of bronchodilator treatment, he still remained hypoxic without any evidence of fluid overload or infection.  Suspect is this aspiration pneumonitis causing his hypoxia therefore home oxygen has been arranged for.  Remains medically stable at this time.  His care has been extensively discussed by me with his wife.  Acute encephalopathy, persist Acute seizures, Acute CVA 21mm left parietal white matter per MRI but neurology thinks is chronic.  History of recurrent embolic infarcts/CVA with hemorrhagic  conversion in July 2020 -Status post loading dose of Keppra, Keppra 500 mg twice daily -EEG-negative for seizures but shows encephalopathy. Continue seizure precaution -Per neurology team patient has chronic CVA.  Follow-up outpatient with neurology.  Recommends against driving, he has been instructed about this.  Wife knows about this as well.  Patient and wife "understands this. -Aspirin 81 mg, atorvastatin 40 mg daily -CT head-negative -UA-shows glucosuria but no evidence of infection  Bilateral multifocal infiltrate Aspiration pneumonitis/pneumonia in the setting of seizure -Ambulatory pulse ox still dropping down to 83% requiring 4 L nasal cannula.  Home oxygen arranged for.  No shortness of breath with ambulation at all.  Doing well with incentive spirometer. -COVID-19-negative  -Procalcitonin-negative, BNP Normal. Discontinue Zosyn after today -Respiratory panel-negative -Echocardiogram March 2020-EF 60 to 65%, mild LVH -I-S//flutter. Scheduled and as needed bronchodilators -CT chest consistent with bilateral infiltrates-aspiration? -aspiration precautions. -Seen by speech team status post MBS-recommending regular diet  Acute kidney injury, prerenal -Appears to have resolved. Creatinine at baseline around 1.1  Coronary artery disease status post PCI -Currently chest pain-free. Continue aspirin and statin  Essential hypertension -Benzapril on hold due to AKI  D M2, poorly controlled due to hyperglycemia -Home p.o. meds on hold, A1c 9.2 -Resume his home regimen  Hyperlipidemia -Statin  OSA -CPAP  PT-Home health-PT/OT/RN.     Discharge Diagnoses:  Principal Problem:   Altered mental status Active Problems:   OSA (obstructive sleep apnea)   Hypertension associated with diabetes ([place])   Uncontrolled type 2 diabetes mellitus with hyperglycemia, without long-term current use of insulin ([facility])   Coronary artery disease   Hyperlipidemia associated  with type 2  diabetes mellitus ([place])   History of CVA (cerebrovascular accident)   Acute encephalopathy   Bilateral pneumonia   Subjective: Patient feels great, he wants to go home today.  Denies any shortness of breath.  Does have still some hypoxia with ambulation very requiring 4 L nasal cannula.  Overall he feels great.    History of Present Illness  10/14/2019  Pulmonary/ 1st office eval/Sherryll Skoczylas  Chief Complaint  Patient presents with  . Consult  Dyspnea:  Walking around outside x 20 minutes picking up sticks s 02  Cough: variable severe dry assoc hoarseness  Sleep: 2lpm hs on side / bed is flat/ one pillow SABA use: neb at least once daily   No obvious day to day or daytime variability or assoc excess/ purulent sputum or mucus plugs or hemoptysis or cp or chest tightness, subjective wheeze or overt sinus or hb symptoms.   Sleeping as above without nocturnal  or early am exacerbation  of respiratory  c/o's or need for noct saba. Also denies any obvious fluctuation of symptoms with weather or environmental changes or other aggravating or alleviating factors except as outlined above   No unusual exposure hx or h/o childhood pna/ asthma or knowledge of premature birth.  Current Allergies, Complete Past Medical History, Past Surgical History, Family History, and Social History were reviewed in [employer] record.  ROS  The following are not active complaints unless bolded Hoarseness, sore throat, dysphagia= globus, dental problems, itching, sneezing,  nasal congestion or discharge of excess mucus or purulent secretions, ear ache,   fever, chills, sweats, unintended wt loss or wt gain, classically pleuritic or exertional cp,  orthopnea pnd or arm/hand swelling  or leg swelling, presyncope, palpitations, abdominal pain, anorexia, nausea, vomiting, diarrhea  or change in bowel habits or change in bladder habits, change in stools or change in urine, dysuria, hematuria,   rash, arthralgias, visual complaints, headache, numbness, weakness or ataxia or problems with walking or coordination,  change in mood or  memory.           Past Medical History:  Diagnosis Date  . Arthritis   . Asthma   . Cancer [facility])    prostate  . Chronic venous insufficiency    varicose vein lower extremity with inflammation  . Coronary artery disease 1996   two stents placed   . Diabetes mellitus without complication ([place])    type 2 on metformin  . GERD (gastroesophageal reflux disease)    no issues since gastric bypass surgery as stated per pt  . Hyperlipidemia   . Hypogonadism in male   . MRSA (methicillin resistant Staphylococcus aureus) infection    07/30/2008 thru 08/07/2008  . Sleep apnea    on BIPAP  . Stented coronary artery   . Stroke ([place])   . 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 albuterol (PROVENTIL) (2.5 MG/3ML) 0.083% nebulizer solution Take 3 mLs (2.5 mg total) by nebulization every 6 (six) hours as needed for wheezing or shortness of breath. 150 mL 1  . albuterol (VENTOLIN HFA) 108 (90 Base) MCG/ACT inhaler Inhale 2 puffs into the lungs every 6 (six) hours as needed for wheezing or shortness of breath. 8 g 1  . amitriptyline (ELAVIL) 25 MG tablet Take 1 tablet (25 mg total) by mouth at bedtime. 90 tablet 3  . aspirin EC 81 MG EC tablet Take 1 tablet (81 mg total) by mouth daily. [place]  tablet 0  . atorvastatin (LIPITOR) 40 MG tablet Take 1 tablet (40 mg total) by mouth at bedtime. 90 tablet 2  . benazepril (LOTENSIN) 40 MG tablet Take 1 tablet (40 mg total) by mouth daily. 90 tablet 3  . budesonide-formoterol (SYMBICORT) 160-4.5 MCG/ACT inhaler Inhale 2 puffs into the lungs 2 (two) times daily. 1 Inhaler 3  . cetirizine (ZYRTEC) 10 MG tablet Take 10 mg by mouth daily.    . Cholecalciferol (VITAMIN D) 2000 units CAPS Take 2,000 Units by mouth daily.    . empagliflozin (JARDIANCE) 10 MG TABS tablet Take  10 mg by mouth daily before breakfast. 90 tablet 3  . fluticasone (FLONASE) 50 MCG/ACT nasal spray Place 1 spray into the nose 2 (two) times daily as needed for congestion.    [name] Kitchen glucose blood test strip 1 each by Other route 2 (two) times daily. DX E11.9 100 each 12  . Lancets (ONETOUCH ULTRASOFT) lancets 1 each by Other route 2 (two) times daily. Dx E11.9 100 each 11  . levETIRAcetam (KEPPRA) 500 MG tablet Take 1 tablet (500 mg total) by mouth 2 (two) times daily. 180 tablet 3  . Magnesium 250 MG TABS Take 250 mg by mouth at bedtime.     . metFORMIN (GLUCOPHAGE) 500 MG tablet Take 2 tablets (1,000 mg total) by mouth 2 (two) times daily with a meal. 360 tablet 2  . Multiple Vitamins-Minerals (MULTIVITAMIN PO) Take 1 tablet by mouth daily.     . valACYclovir (VALTREX) 500 MG tablet Take 1 tablet (500 mg total) by mouth daily. 90 tablet 2      Objective:     BP (!) 98/58   Pulse 82   Temp (!) 97.1 F (36.2 C) (Temporal)   Ht 5\' 10"  (1.778 m)   Wt 190 lb (86.2 kg)   SpO2 95%   BMI 27.26 kg/m   SpO2: 95 % O2 Type: Continuous O2 O2 Flow Rate (L/min): 2 L/min   Elderly very hoarse wm nad   HEENT : pt wearing mask not removed for exam due to covid -19 concerns.    NECK :  without JVD/Nodes/TM/ nl carotid upstrokes bilaterally   LUNGS: no acc muscle use,  Nl contour chest with insp crackles bilaterally s wheeze  without cough on insp or exp maneuvers   CV:  RRR  no s3 or murmur or increase in P2, and no edema   ABD:  soft and nontender with nl inspiratory excursion in the supine position. No bruits or organomegaly appreciated, bowel sounds nl  MS:  Nl gait/ ext warm without deformities, calf tenderness, cyanosis or clubbing No obvious joint restrictions   SKIN: warm and dry without lesions    NEURO:  alert, approp, nl sensorium with  no motor or cerebellar deficits apparent.   CXR PA and Lateral:   10/14/2019 :    I personally reviewed images and   impression as follows:     Marked improvement aeration esp on R  Labs ordered/ reviewed:      Chemistry      Component Value Date/Time   NA 134 (L) 10/14/2019 1025   NA 139 06/21/2019 1459   NA 140 07/29/2011 1102   K 4.4 10/14/2019 1025   K 3.8 07/29/2011 1102   CL 97 10/14/2019 1025   CL 102 07/29/2011 1102   CO2 27 10/14/2019 1025   CO2 29 07/29/2011 1102   BUN 24 (H) 10/14/2019 1025   BUN 21 06/21/2019 1459  BUN 18 07/29/2011 1102   CREATININE 1.41 10/14/2019 1025   CREATININE 0.99 07/29/2011 1102      Component Value Date/Time   CALCIUM 9.8 10/14/2019 1025   CALCIUM 8.5 07/29/2011 1102   ALKPHOS 51 09/12/2019 0408   ALKPHOS 51 07/29/2011 1102   AST 55 (H) 09/12/2019 0408   AST 36 01/25/2018 0815   AST 57 (H) 07/29/2011 1102   ALT 41 09/12/2019 0408   ALT 52 (H) 01/25/2018 0815   ALT 88 (H) 07/29/2011 1102   BILITOT 1.8 (H) 09/12/2019 0408   BILITOT 0.6 06/21/2019 1459   BILITOT 0.5 07/29/2011 1102        Lab Results  Component Value Date   WBC 6.7 10/14/2019   HGB 13.8 10/14/2019   HCT 42.0 10/14/2019   MCV 95.4 10/14/2019   PLT 207.0 10/14/2019              Lab Results  Component Value Date   ESRSEDRATE 12 10/14/2019   ESRSEDRATE 39 (H) 09/08/2019             Assessment   No problem-specific Assessment & Plan notes found for this encounter.     Christinia Gully, MD 10/14/2019

## 2019-10-14 NOTE — Assessment & Plan Note (Signed)
See admit 09/06/19 > marked improvement on cxr 10/14/2019    C/w ALI from pneumonia/ asp or otherwise, no additional w/u needed.

## 2019-10-14 NOTE — Assessment & Plan Note (Signed)
Changed ACEi to ARB 10/14/2019 due to hoarseness/ psueudoasthma  In the best review of chronic cough to date ( NEJM 2016 375 [id number]) ,  ACEi are now felt to cause cough in up to  20% of pts which is a 4 fold increase from previous reports and does not include the variety of non-specific complaints we see in pulmonary clinic in pts on ACEi but previously attributed to another dx like  Copd/asthma and  include PNDS, throat and chest congestion, "bronchitis", unexplained dyspnea and noct "strangling" sensations, and hoarseness, but also  atypical /refractory GERD symptoms like dysphagia and "bad heartburn"   The only way I know  to prove this is not an "ACEi Case" is a trial off ACEi x a minimum of 6 weeks then regroup.   Try micardis 80 mg daily / break in half if too strong and f/u 3 weeks

## 2019-10-14 NOTE — Assessment & Plan Note (Signed)
No desats walking RA as of 10/14/2019   Ok to just use 02 2lpm hs and avised:  Make sure you check your oxygen saturations at highest level of activity to be sure it stays over 90% and adjust upward to maintain this level if needed but remember to turn it back to previous settings when you stop (to conserve your supply).           Each maintenance medication was reviewed in detail including emphasizing most importantly the difference between maintenance and prns and under what circumstances the prns are to be triggered using an action plan format where appropriate.  Total time for H and P, chart review, counseling, teaching device,  directly observing portions of ambulatory 02 saturation study/  and generating customized AVS unique to this office visit / charting = 60 min

## 2019-10-15 DIAGNOSIS — E785 Hyperlipidemia, unspecified: Secondary | ICD-10-CM | POA: Diagnosis not present

## 2019-10-15 DIAGNOSIS — Z8701 Personal history of pneumonia (recurrent): Secondary | ICD-10-CM | POA: Diagnosis not present

## 2019-10-15 DIAGNOSIS — I872 Venous insufficiency (chronic) (peripheral): Secondary | ICD-10-CM | POA: Diagnosis not present

## 2019-10-15 DIAGNOSIS — G4733 Obstructive sleep apnea (adult) (pediatric): Secondary | ICD-10-CM | POA: Diagnosis not present

## 2019-10-15 DIAGNOSIS — J449 Chronic obstructive pulmonary disease, unspecified: Secondary | ICD-10-CM | POA: Diagnosis not present

## 2019-10-15 DIAGNOSIS — Z8546 Personal history of malignant neoplasm of prostate: Secondary | ICD-10-CM | POA: Diagnosis not present

## 2019-10-15 DIAGNOSIS — Z96653 Presence of artificial knee joint, bilateral: Secondary | ICD-10-CM | POA: Diagnosis not present

## 2019-10-15 DIAGNOSIS — I639 Cerebral infarction, unspecified: Secondary | ICD-10-CM | POA: Diagnosis not present

## 2019-10-15 DIAGNOSIS — M1990 Unspecified osteoarthritis, unspecified site: Secondary | ICD-10-CM | POA: Diagnosis not present

## 2019-10-15 DIAGNOSIS — I2510 Atherosclerotic heart disease of native coronary artery without angina pectoris: Secondary | ICD-10-CM | POA: Diagnosis not present

## 2019-10-15 DIAGNOSIS — I6930 Unspecified sequelae of cerebral infarction: Secondary | ICD-10-CM | POA: Diagnosis not present

## 2019-10-15 DIAGNOSIS — Z955 Presence of coronary angioplasty implant and graft: Secondary | ICD-10-CM | POA: Diagnosis not present

## 2019-10-15 DIAGNOSIS — E1151 Type 2 diabetes mellitus with diabetic peripheral angiopathy without gangrene: Secondary | ICD-10-CM | POA: Diagnosis not present

## 2019-10-15 DIAGNOSIS — Z7984 Long term (current) use of oral hypoglycemic drugs: Secondary | ICD-10-CM | POA: Diagnosis not present

## 2019-10-15 DIAGNOSIS — Z7982 Long term (current) use of aspirin: Secondary | ICD-10-CM | POA: Diagnosis not present

## 2019-10-15 DIAGNOSIS — Z9181 History of falling: Secondary | ICD-10-CM | POA: Diagnosis not present

## 2019-10-15 DIAGNOSIS — Z7951 Long term (current) use of inhaled steroids: Secondary | ICD-10-CM | POA: Diagnosis not present

## 2019-10-15 DIAGNOSIS — K219 Gastro-esophageal reflux disease without esophagitis: Secondary | ICD-10-CM | POA: Diagnosis not present

## 2019-10-15 DIAGNOSIS — Z9981 Dependence on supplemental oxygen: Secondary | ICD-10-CM | POA: Diagnosis not present

## 2019-10-15 DIAGNOSIS — I10 Essential (primary) hypertension: Secondary | ICD-10-CM | POA: Diagnosis not present

## 2019-10-15 DIAGNOSIS — I69328 Other speech and language deficits following cerebral infarction: Secondary | ICD-10-CM | POA: Diagnosis not present

## 2019-10-17 ENCOUNTER — Telehealth: Payer: Self-pay | Admitting: Nurse Practitioner

## 2019-10-17 DIAGNOSIS — Z9181 History of falling: Secondary | ICD-10-CM | POA: Diagnosis not present

## 2019-10-17 DIAGNOSIS — G4733 Obstructive sleep apnea (adult) (pediatric): Secondary | ICD-10-CM | POA: Diagnosis not present

## 2019-10-17 DIAGNOSIS — Z9981 Dependence on supplemental oxygen: Secondary | ICD-10-CM | POA: Diagnosis not present

## 2019-10-17 DIAGNOSIS — I10 Essential (primary) hypertension: Secondary | ICD-10-CM | POA: Diagnosis not present

## 2019-10-17 DIAGNOSIS — Z955 Presence of coronary angioplasty implant and graft: Secondary | ICD-10-CM | POA: Diagnosis not present

## 2019-10-17 DIAGNOSIS — Z96653 Presence of artificial knee joint, bilateral: Secondary | ICD-10-CM | POA: Diagnosis not present

## 2019-10-17 DIAGNOSIS — I639 Cerebral infarction, unspecified: Secondary | ICD-10-CM | POA: Diagnosis not present

## 2019-10-17 DIAGNOSIS — I872 Venous insufficiency (chronic) (peripheral): Secondary | ICD-10-CM | POA: Diagnosis not present

## 2019-10-17 DIAGNOSIS — E1151 Type 2 diabetes mellitus with diabetic peripheral angiopathy without gangrene: Secondary | ICD-10-CM | POA: Diagnosis not present

## 2019-10-17 DIAGNOSIS — Z7951 Long term (current) use of inhaled steroids: Secondary | ICD-10-CM | POA: Diagnosis not present

## 2019-10-17 DIAGNOSIS — M1990 Unspecified osteoarthritis, unspecified site: Secondary | ICD-10-CM | POA: Diagnosis not present

## 2019-10-17 DIAGNOSIS — I69328 Other speech and language deficits following cerebral infarction: Secondary | ICD-10-CM | POA: Diagnosis not present

## 2019-10-17 DIAGNOSIS — Z7982 Long term (current) use of aspirin: Secondary | ICD-10-CM | POA: Diagnosis not present

## 2019-10-17 DIAGNOSIS — E785 Hyperlipidemia, unspecified: Secondary | ICD-10-CM | POA: Diagnosis not present

## 2019-10-17 DIAGNOSIS — Z8701 Personal history of pneumonia (recurrent): Secondary | ICD-10-CM | POA: Diagnosis not present

## 2019-10-17 DIAGNOSIS — Z8546 Personal history of malignant neoplasm of prostate: Secondary | ICD-10-CM | POA: Diagnosis not present

## 2019-10-17 DIAGNOSIS — J449 Chronic obstructive pulmonary disease, unspecified: Secondary | ICD-10-CM | POA: Diagnosis not present

## 2019-10-17 DIAGNOSIS — K219 Gastro-esophageal reflux disease without esophagitis: Secondary | ICD-10-CM | POA: Diagnosis not present

## 2019-10-17 DIAGNOSIS — Z7984 Long term (current) use of oral hypoglycemic drugs: Secondary | ICD-10-CM | POA: Diagnosis not present

## 2019-10-17 DIAGNOSIS — I6930 Unspecified sequelae of cerebral infarction: Secondary | ICD-10-CM | POA: Diagnosis not present

## 2019-10-17 DIAGNOSIS — I2510 Atherosclerotic heart disease of native coronary artery without angina pectoris: Secondary | ICD-10-CM | POA: Diagnosis not present

## 2019-10-17 NOTE — Telephone Encounter (Signed)
Verbal orders provided via telephone to [place].

## 2019-10-17 NOTE — Telephone Encounter (Signed)
Home Health Verbal Orders - Caller/Agency: Joana// [facility] Callback Number: [phone] Requesting OT/PT/Skilled Nursing/Social Work/Speech Therapy: Speech Evaluation   Frequency: N/A

## 2019-10-17 NOTE — Progress Notes (Signed)
Spoke with pt and notified of results per Dr. Wert. Pt verbalized understanding and denied any questions. 

## 2019-10-18 ENCOUNTER — Other Ambulatory Visit: Payer: Self-pay

## 2019-10-18 ENCOUNTER — Encounter: Payer: Self-pay | Admitting: Nurse Practitioner

## 2019-10-18 ENCOUNTER — Ambulatory Visit (INDEPENDENT_AMBULATORY_CARE_PROVIDER_SITE_OTHER): Payer: PPO | Admitting: Nurse Practitioner

## 2019-10-18 VITALS — BP 111/73 | HR 66 | Temp 97.4°F | Wt 192.0 lb

## 2019-10-18 DIAGNOSIS — G4733 Obstructive sleep apnea (adult) (pediatric): Secondary | ICD-10-CM | POA: Diagnosis not present

## 2019-10-18 DIAGNOSIS — R569 Unspecified convulsions: Secondary | ICD-10-CM

## 2019-10-18 DIAGNOSIS — J189 Pneumonia, unspecified organism: Secondary | ICD-10-CM | POA: Diagnosis not present

## 2019-10-18 DIAGNOSIS — F4321 Adjustment disorder with depressed mood: Secondary | ICD-10-CM | POA: Insufficient documentation

## 2019-10-18 DIAGNOSIS — E1165 Type 2 diabetes mellitus with hyperglycemia: Secondary | ICD-10-CM

## 2019-10-18 NOTE — Assessment & Plan Note (Signed)
Continue 100% adherence to CPAP use.  Will have return to have settings adjusted.

## 2019-10-18 NOTE — Progress Notes (Signed)
BP 111/73   Pulse 66   Temp (!) 97.4 F (36.3 C) (Oral)   Wt 192 lb (87.1 kg)   SpO2 92%   BMI 27.55 kg/m    Subjective:    Patient ID: Andrew Cobb, male    DOB: 1950/10/10, 69 y.o.   MRN: [id number]  HPI: Andrew Cobb is a 69 y.o. male  Chief Complaint  Patient presents with  . Diabetes  . Depression   DIABETES Last A1C in March while in hospital was 9.2%, which is up from previous 7%. He continues on Metformin 500 MG in morning and 1000 MG at night and Jardiance 10 MG.  Was recently hospitalized for pneumonia and is being followed by pulmonary with recent repeat CXR obtained.  No current O2 use. Hypoglycemic episodes:no Polydipsia/polyuria:no Visual disturbance:no Chest pain:no Paresthesias:no Glucose Monitoring:yes Accucheck frequency: Daily Fasting glucose: 135 to 140 since return home Post prandial: Evening: Before meals: Taking Insulin?:no Long acting insulin: Short acting insulin: Blood Pressure Monitoring:daily Retinal Examination:Not Up to Date Foot Exam:Up to Date Pneumovax:refused Influenza:Up to Date Aspirin:yes  DEPRESSION No current medications. Mood status: stable Satisfied with current treatment?: yes Previous psychiatric medications: none Depressed mood: no Anxious mood: no Anhedonia: no Significant weight loss or gain: no Insomnia: none -- uses CPAP 100% of the time -- Feeling Great 6 years ago, reports he needs settings changed Fatigue: no Feelings of worthlessness or guilt: no Impaired concentration/indecisiveness: no Suicidal ideations: no Hopelessness: no Crying spells: no Depression screen [facility] 2/9 10/18/2019 09/26/2019 07/18/2019 06/21/2019 10/13/2018  Decreased Interest 0 0 0 0 0  Down, Depressed, Hopeless 0 0 0 0 0  PHQ - 2 Score 0 0 0 0 0  Altered sleeping 0 - - - -  Tired, decreased energy 0 - - - -  Change in appetite 0 -  - - -  Feeling bad or failure about yourself  0 - - - -  Trouble concentrating 0 - - - -  Moving slowly or fidgety/restless 0 - - - -  Suicidal thoughts 0 - - - -  PHQ-9 Score 0 - - - -  Difficult doing work/chores Not difficult at all - - - -  Some recent data might be hidden   SEIZURES: Seen by neurology on 10/11/2019 and recommendation to continue Keppra 500 MG BID and repeat EEG, on review if EEG returns normal they will discuss discontinuation of Keppra.    Relevant past medical, surgical, family and social history reviewed and updated as indicated. Interim medical history since our last visit reviewed. Allergies and medications reviewed and updated.  Review of Systems  Constitutional: Negative for activity change, diaphoresis, fatigue and fever.  Respiratory: Negative for cough, chest tightness, shortness of breath and wheezing.   Cardiovascular: Negative for chest pain, palpitations and leg swelling.  Gastrointestinal: Negative.   Endocrine: Negative for cold intolerance, heat intolerance, polydipsia, polyphagia and polyuria.  Neurological: Negative for dizziness, seizures, syncope, facial asymmetry, speech difficulty, weakness and headaches.  Psychiatric/Behavioral: Negative.     Per HPI unless specifically indicated above     Objective:    BP 111/73   Pulse 66   Temp (!) 97.4 F (36.3 C) (Oral)   Wt 192 lb (87.1 kg)   SpO2 92%   BMI 27.55 kg/m   Wt Readings from Last 3 Encounters:  10/18/19 192 lb (87.1 kg)  10/14/19 190 lb (86.2 kg)  10/11/19 183 lb (83 kg)    Physical Exam Vitals and nursing  note reviewed.  Constitutional:      General: He is awake. He is not in acute distress.    Appearance: He is well-developed and well-groomed. He is not ill-appearing.  HENT:     Head: Normocephalic and atraumatic.     Right Ear: Hearing normal. No drainage.     Left Ear: Hearing normal. No drainage.     Nose:     Comments: Mask in place.    Mouth/Throat:      Pharynx: Uvula midline.  Eyes:     General: Lids are normal.        Right eye: No discharge.        Left eye: No discharge.     Conjunctiva/sclera: Conjunctivae normal.     Pupils: Pupils are equal, round, and reactive to light.  Neck:     Thyroid: No thyromegaly.     Vascular: No carotid bruit or JVD.     Trachea: Trachea normal.  Cardiovascular:     Rate and Rhythm: Normal rate and regular rhythm.     Heart sounds: Normal heart sounds, S1 normal and S2 normal. No murmur. No gallop.   Pulmonary:     Effort: Pulmonary effort is normal. No accessory muscle usage or respiratory distress.     Breath sounds: Normal breath sounds.  Abdominal:     General: Bowel sounds are normal.     Palpations: Abdomen is soft. There is no hepatomegaly or splenomegaly.  Musculoskeletal:        General: Normal range of motion.     Cervical back: Normal range of motion and neck supple.     Right lower leg: No edema.     Left lower leg: No edema.  Skin:    General: Skin is warm and dry.     Capillary Refill: Capillary refill takes less than 2 seconds.     Findings: No rash.  Neurological:     Mental Status: He is alert and oriented to person, place, and time.     Deep Tendon Reflexes: Reflexes are normal and symmetric.  Psychiatric:        Attention and Perception: Attention normal.        Mood and Affect: Mood normal.        Speech: Speech normal.        Behavior: Behavior normal. Behavior is cooperative.        Thought Content: Thought content normal.     Results for orders placed or performed in visit on [id number]  Basic metabolic panel  Result Value Ref Range   Sodium 134 (L) 135 - 145 mEq/L   Potassium 4.4 3.5 - 5.1 mEq/L   Chloride 97 96 - 112 mEq/L   CO2 27 19 - 32 mEq/L   Glucose, Bld 168 (H) 70 - 99 mg/dL   BUN 24 (H) 6 - 23 mg/dL   Creatinine, Ser 1.41 0.40 - 1.50 mg/dL   GFR 49.88 (L) >60.00 mL/min   Calcium 9.8 8.4 - 10.5 mg/dL  Sedimentation rate  Result Value Ref Range    Sed Rate 12 0 - 20 mm/hr  CBC with Differential/Platelet  Result Value Ref Range   WBC 6.7 4.0 - 10.5 K/uL   RBC 4.40 4.22 - 5.81 Mil/uL   Hemoglobin 13.8 13.0 - 17.0 g/dL   HCT 42.0 39.0 - 52.0 %   MCV 95.4 78.0 - 100.0 fl   MCHC 32.9 30.0 - 36.0 g/dL   RDW 16.1 (H) 11.5 - 15.5 %  Platelets 207.0 150.0 - 400.0 K/uL   Neutrophils Relative % 73.1 43.0 - 77.0 %   Lymphocytes Relative 14.2 12.0 - 46.0 %   Monocytes Relative 9.5 3.0 - 12.0 %   Eosinophils Relative 2.4 0.0 - 5.0 %   Basophils Relative 0.8 0.0 - 3.0 %   Neutro Abs 4.9 1.4 - 7.7 K/uL   Lymphs Abs 1.0 0.7 - 4.0 K/uL   Monocytes Absolute 0.6 0.1 - 1.0 K/uL   Eosinophils Absolute 0.2 0.0 - 0.7 K/uL   Basophils Absolute 0.1 0.0 - 0.1 K/uL      Assessment & Plan:   Problem List Items Addressed This Visit      Respiratory   OSA (obstructive sleep apnea)    Continue 100% adherence to CPAP use.  Will have return to have settings adjusted.      Bilateral pneumonia    Improving, being followed by pulmonary with recent repeat CXR obtained.  Showed partial clearing of PNA on 10/14/2019.  Continue current regimen and collaboration with pulmonary.        Endocrine   Uncontrolled type 2 diabetes mellitus with hyperglycemia, without long-term current use of insulin (HCC) - Primary    Chronic, ongoing with A1C today 9.2%, upward trend + last urine ALB 10 and A:C <30.  Sugars are trending down at home.  Not at neuro goal of <6.5%.  Continue  Metformin 1000 MG BID and continue Jardiance 10 MG, will increase in June if A1C elevation.  Recommend he check BS twice daily.  Return in 2 months for A1C check.        Other   Situational depression    Post PNA and CVAs.  Currently no medications and does not wish to start.  Denies SI/HI.  PHQ9 = 0 today. Monitor and initiate medication as needed.      Seizures ([place])    Being followed by neurology.  Continue current Keppra dose as prescribed by them and collaboration with neurology,  reviewed notes.          Follow up plan: Return in about 2 months (around 12/18/2019) for T2DM, HTN/HLD, SEIZURES, CKD.

## 2019-10-18 NOTE — Assessment & Plan Note (Addendum)
Post PNA and CVAs.  Currently no medications and does not wish to start.  Denies SI/HI.  PHQ9 = 0 today. Monitor and initiate medication as needed.

## 2019-10-18 NOTE — Assessment & Plan Note (Signed)
Chronic, ongoing with A1C today 9.2%, upward trend + last urine ALB 10 and A:C <30.  Sugars are trending down at home.  Not at neuro goal of <6.5%.  Continue  Metformin 1000 MG BID and continue Jardiance 10 MG, will increase in June if A1C elevation.  Recommend he check BS twice daily.  Return in 2 months for A1C check.

## 2019-10-18 NOTE — Assessment & Plan Note (Signed)
Being followed by neurology.  Continue current Keppra dose as prescribed by them and collaboration with neurology, reviewed notes.   

## 2019-10-18 NOTE — Patient Instructions (Signed)
FEELING GREAT -- sleep study -- see if covered -- if not call [facility] neurology and see if they would be able to see him about CPAP and sleep -- if need a referral for this I will place   Carbohydrate Counting for Diabetes Mellitus, Adult  Carbohydrate counting is a method of keeping track of how many carbohydrates you eat. Eating carbohydrates naturally increases the amount of sugar (glucose) in the blood. Counting how many carbohydrates you eat helps keep your blood glucose within normal limits, which helps you manage your diabetes (diabetes mellitus). It is important to know how many carbohydrates you can safely have in each meal. This is different for every person. A diet and nutrition specialist (registered dietitian) can help you make a meal plan and calculate how many carbohydrates you should have at each meal and snack. Carbohydrates are found in the following foods:  Grains, such as breads and cereals.  Dried beans and soy products.  Starchy vegetables, such as potatoes, peas, and corn.  Fruit and fruit juices.  Milk and yogurt.  Sweets and snack foods, such as cake, cookies, candy, chips, and soft drinks. How do I count carbohydrates? There are two ways to count carbohydrates in food. You can use either of the methods or a combination of both. Reading "Nutrition Facts" on packaged food The "Nutrition Facts" list is included on the labels of almost all packaged foods and beverages in the [place] It includes:  The serving size.  Information about nutrients in each serving, including the grams (g) of carbohydrate per serving. To use the "Nutrition Facts":  Decide how many servings you will have.  Multiply the number of servings by the number of carbohydrates per serving.  The resulting number is the total amount of carbohydrates that you will be having. Learning standard serving sizes of other foods When you eat carbohydrate foods that are not packaged or do not include  "Nutrition Facts" on the label, you need to measure the servings in order to count the amount of carbohydrates:  Measure the foods that you will eat with a food scale or measuring cup, if needed.  Decide how many standard-size servings you will eat.  Multiply the number of servings by 15. Most carbohydrate-rich foods have about 15 g of carbohydrates per serving. ? For example, if you eat 8 oz (170 g) of strawberries, you will have eaten 2 servings and 30 g of carbohydrates (2 servings x 15 g = 30 g).  For foods that have more than one food mixed, such as soups and casseroles, you must count the carbohydrates in each food that is included. The following list contains standard serving sizes of common carbohydrate-rich foods. Each of these servings has about 15 g of carbohydrates:   hamburger bun or  English muffin.   oz (15 mL) syrup.   oz (14 g) jelly.  1 slice of bread.  1 six-inch tortilla.  3 oz (85 g) cooked rice or pasta.  4 oz (113 g) cooked dried beans.  4 oz (113 g) starchy vegetable, such as peas, corn, or potatoes.  4 oz (113 g) hot cereal.  4 oz (113 g) mashed potatoes or  of a large baked potato.  4 oz (113 g) canned or frozen fruit.  4 oz (120 mL) fruit juice.  4-6 crackers.  6 chicken nuggets.  6 oz (170 g) unsweetened dry cereal.  6 oz (170 g) plain fat-free yogurt or yogurt sweetened with artificial sweeteners.  8 oz (  240 mL) milk.  8 oz (170 g) fresh fruit or one small piece of fruit.  24 oz (680 g) popped popcorn. Example of carbohydrate counting Sample meal  3 oz (85 g) chicken breast.  6 oz (170 g) brown rice.  4 oz (113 g) corn.  8 oz (240 mL) milk.  8 oz (170 g) strawberries with sugar-free whipped topping. Carbohydrate calculation 1. Identify the foods that contain carbohydrates: ? Rice. ? Corn. ? Milk. ? Strawberries. 2. Calculate how many servings you have of each food: ? 2 servings rice. ? 1 serving corn. ? 1 serving  milk. ? 1 serving strawberries. 3. Multiply each number of servings by 15 g: ? 2 servings rice x 15 g = 30 g. ? 1 serving corn x 15 g = 15 g. ? 1 serving milk x 15 g = 15 g. ? 1 serving strawberries x 15 g = 15 g. 4. Add together all of the amounts to find the total grams of carbohydrates eaten: ? 30 g + 15 g + 15 g + 15 g = 75 g of carbohydrates total. Summary  Carbohydrate counting is a method of keeping track of how many carbohydrates you eat.  Eating carbohydrates naturally increases the amount of sugar (glucose) in the blood.  Counting how many carbohydrates you eat helps keep your blood glucose within normal limits, which helps you manage your diabetes.  A diet and nutrition specialist (registered dietitian) can help you make a meal plan and calculate how many carbohydrates you should have at each meal and snack. This information is not intended to replace advice given to you by your health care provider. Make sure you discuss any questions you have with your health care provider. Document Revised: 01/15/2017 Document Reviewed: 12/05/2015 Elsevier Patient Education  Andrew Cobb.

## 2019-10-18 NOTE — Assessment & Plan Note (Signed)
Improving, being followed by pulmonary with recent repeat CXR obtained.  Showed partial clearing of PNA on 10/14/2019.  Continue current regimen and collaboration with pulmonary.

## 2019-10-21 ENCOUNTER — Telehealth: Payer: Self-pay | Admitting: Nurse Practitioner

## 2019-10-21 DIAGNOSIS — J189 Pneumonia, unspecified organism: Secondary | ICD-10-CM | POA: Diagnosis not present

## 2019-10-21 DIAGNOSIS — I2510 Atherosclerotic heart disease of native coronary artery without angina pectoris: Secondary | ICD-10-CM | POA: Diagnosis not present

## 2019-10-21 NOTE — Telephone Encounter (Signed)
Copied from [place] [id number]. Topic: General - Other >> Oct 21, 2019  2:09 PM Celene Kras wrote: Reason for CRM: [employer] [facility] [employer], called and is requesting an update on a fax they sent over on 09/27/19. Please advise.

## 2019-10-24 DIAGNOSIS — C61 Malignant neoplasm of prostate: Secondary | ICD-10-CM | POA: Diagnosis not present

## 2019-10-24 NOTE — Telephone Encounter (Signed)
Received call back from [facility]. They are going to resend the fax on this, states it is a CMN.

## 2019-10-24 NOTE — Telephone Encounter (Signed)
Tried calling [employer] back. Cannot find any paperwork from this regarding this patient. Need it resent to [place]. Wait for representative was over 15 minutes so I left number number for someone to give me a call back regarding this.

## 2019-10-25 DIAGNOSIS — I2510 Atherosclerotic heart disease of native coronary artery without angina pectoris: Secondary | ICD-10-CM | POA: Diagnosis not present

## 2019-10-25 DIAGNOSIS — I639 Cerebral infarction, unspecified: Secondary | ICD-10-CM | POA: Diagnosis not present

## 2019-10-25 DIAGNOSIS — Z955 Presence of coronary angioplasty implant and graft: Secondary | ICD-10-CM | POA: Diagnosis not present

## 2019-10-25 DIAGNOSIS — Z7951 Long term (current) use of inhaled steroids: Secondary | ICD-10-CM | POA: Diagnosis not present

## 2019-10-25 DIAGNOSIS — Z7984 Long term (current) use of oral hypoglycemic drugs: Secondary | ICD-10-CM | POA: Diagnosis not present

## 2019-10-25 DIAGNOSIS — I69328 Other speech and language deficits following cerebral infarction: Secondary | ICD-10-CM | POA: Diagnosis not present

## 2019-10-25 DIAGNOSIS — I10 Essential (primary) hypertension: Secondary | ICD-10-CM | POA: Diagnosis not present

## 2019-10-25 DIAGNOSIS — J449 Chronic obstructive pulmonary disease, unspecified: Secondary | ICD-10-CM | POA: Diagnosis not present

## 2019-10-25 DIAGNOSIS — M1990 Unspecified osteoarthritis, unspecified site: Secondary | ICD-10-CM | POA: Diagnosis not present

## 2019-10-25 DIAGNOSIS — Z8546 Personal history of malignant neoplasm of prostate: Secondary | ICD-10-CM | POA: Diagnosis not present

## 2019-10-25 DIAGNOSIS — K219 Gastro-esophageal reflux disease without esophagitis: Secondary | ICD-10-CM | POA: Diagnosis not present

## 2019-10-25 DIAGNOSIS — Z8701 Personal history of pneumonia (recurrent): Secondary | ICD-10-CM | POA: Diagnosis not present

## 2019-10-25 DIAGNOSIS — G4733 Obstructive sleep apnea (adult) (pediatric): Secondary | ICD-10-CM | POA: Diagnosis not present

## 2019-10-25 DIAGNOSIS — Z7982 Long term (current) use of aspirin: Secondary | ICD-10-CM | POA: Diagnosis not present

## 2019-10-25 DIAGNOSIS — I6930 Unspecified sequelae of cerebral infarction: Secondary | ICD-10-CM | POA: Diagnosis not present

## 2019-10-25 DIAGNOSIS — E785 Hyperlipidemia, unspecified: Secondary | ICD-10-CM | POA: Diagnosis not present

## 2019-10-25 DIAGNOSIS — E1151 Type 2 diabetes mellitus with diabetic peripheral angiopathy without gangrene: Secondary | ICD-10-CM | POA: Diagnosis not present

## 2019-10-25 DIAGNOSIS — I872 Venous insufficiency (chronic) (peripheral): Secondary | ICD-10-CM | POA: Diagnosis not present

## 2019-10-25 DIAGNOSIS — Z96653 Presence of artificial knee joint, bilateral: Secondary | ICD-10-CM | POA: Diagnosis not present

## 2019-10-25 DIAGNOSIS — Z9181 History of falling: Secondary | ICD-10-CM | POA: Diagnosis not present

## 2019-10-25 DIAGNOSIS — Z9981 Dependence on supplemental oxygen: Secondary | ICD-10-CM | POA: Diagnosis not present

## 2019-10-31 ENCOUNTER — Ambulatory Visit (INDEPENDENT_AMBULATORY_CARE_PROVIDER_SITE_OTHER): Payer: PPO | Admitting: Neurology

## 2019-10-31 ENCOUNTER — Other Ambulatory Visit: Payer: Self-pay

## 2019-10-31 DIAGNOSIS — R569 Unspecified convulsions: Secondary | ICD-10-CM

## 2019-10-31 LAB — CUP PACEART REMOTE DEVICE CHECK
Date Time Interrogation Session: 20210425015724
Implantable Pulse Generator Implant Date: 20200316

## 2019-11-01 ENCOUNTER — Ambulatory Visit (INDEPENDENT_AMBULATORY_CARE_PROVIDER_SITE_OTHER): Payer: PPO | Admitting: *Deleted

## 2019-11-01 DIAGNOSIS — I639 Cerebral infarction, unspecified: Secondary | ICD-10-CM

## 2019-11-02 ENCOUNTER — Telehealth: Payer: Self-pay | Admitting: *Deleted

## 2019-11-02 ENCOUNTER — Ambulatory Visit
Admission: RE | Admit: 2019-11-02 | Discharge: 2019-11-02 | Disposition: A | Discharge status: Home / Self Care | Payer: PPO | Source: Ambulatory Visit | Attending: Radiation Oncology | Admitting: Radiation Oncology

## 2019-11-02 ENCOUNTER — Other Ambulatory Visit: Payer: Self-pay

## 2019-11-02 DIAGNOSIS — J45909 Unspecified asthma, uncomplicated: Secondary | ICD-10-CM | POA: Insufficient documentation

## 2019-11-02 DIAGNOSIS — C61 Malignant neoplasm of prostate: Secondary | ICD-10-CM | POA: Diagnosis not present

## 2019-11-02 DIAGNOSIS — Z8673 Personal history of transient ischemic attack (TIA), and cerebral infarction without residual deficits: Secondary | ICD-10-CM | POA: Insufficient documentation

## 2019-11-02 DIAGNOSIS — E119 Type 2 diabetes mellitus without complications: Secondary | ICD-10-CM | POA: Diagnosis not present

## 2019-11-02 DIAGNOSIS — Z955 Presence of coronary angioplasty implant and graft: Secondary | ICD-10-CM | POA: Diagnosis not present

## 2019-11-02 DIAGNOSIS — Z8614 Personal history of Methicillin resistant Staphylococcus aureus infection: Secondary | ICD-10-CM | POA: Diagnosis not present

## 2019-11-02 DIAGNOSIS — J449 Chronic obstructive pulmonary disease, unspecified: Secondary | ICD-10-CM | POA: Diagnosis not present

## 2019-11-02 DIAGNOSIS — E1151 Type 2 diabetes mellitus with diabetic peripheral angiopathy without gangrene: Secondary | ICD-10-CM | POA: Diagnosis not present

## 2019-11-02 DIAGNOSIS — G4733 Obstructive sleep apnea (adult) (pediatric): Secondary | ICD-10-CM | POA: Diagnosis not present

## 2019-11-02 DIAGNOSIS — G4730 Sleep apnea, unspecified: Secondary | ICD-10-CM | POA: Diagnosis not present

## 2019-11-02 DIAGNOSIS — E785 Hyperlipidemia, unspecified: Secondary | ICD-10-CM | POA: Diagnosis not present

## 2019-11-02 DIAGNOSIS — I2510 Atherosclerotic heart disease of native coronary artery without angina pectoris: Secondary | ICD-10-CM | POA: Insufficient documentation

## 2019-11-02 DIAGNOSIS — Z79899 Other long term (current) drug therapy: Secondary | ICD-10-CM | POA: Diagnosis not present

## 2019-11-02 DIAGNOSIS — I639 Cerebral infarction, unspecified: Secondary | ICD-10-CM | POA: Diagnosis not present

## 2019-11-02 DIAGNOSIS — Z8546 Personal history of malignant neoplasm of prostate: Secondary | ICD-10-CM | POA: Diagnosis not present

## 2019-11-02 DIAGNOSIS — Z9181 History of falling: Secondary | ICD-10-CM | POA: Diagnosis not present

## 2019-11-02 DIAGNOSIS — K219 Gastro-esophageal reflux disease without esophagitis: Secondary | ICD-10-CM | POA: Insufficient documentation

## 2019-11-02 DIAGNOSIS — Z96653 Presence of artificial knee joint, bilateral: Secondary | ICD-10-CM | POA: Diagnosis not present

## 2019-11-02 DIAGNOSIS — M129 Arthropathy, unspecified: Secondary | ICD-10-CM | POA: Diagnosis not present

## 2019-11-02 DIAGNOSIS — I6930 Unspecified sequelae of cerebral infarction: Secondary | ICD-10-CM | POA: Diagnosis not present

## 2019-11-02 DIAGNOSIS — M1990 Unspecified osteoarthritis, unspecified site: Secondary | ICD-10-CM | POA: Diagnosis not present

## 2019-11-02 DIAGNOSIS — Z7984 Long term (current) use of oral hypoglycemic drugs: Secondary | ICD-10-CM | POA: Diagnosis not present

## 2019-11-02 DIAGNOSIS — I10 Essential (primary) hypertension: Secondary | ICD-10-CM | POA: Diagnosis not present

## 2019-11-02 DIAGNOSIS — Z8701 Personal history of pneumonia (recurrent): Secondary | ICD-10-CM | POA: Diagnosis not present

## 2019-11-02 DIAGNOSIS — I69328 Other speech and language deficits following cerebral infarction: Secondary | ICD-10-CM | POA: Diagnosis not present

## 2019-11-02 DIAGNOSIS — Z7951 Long term (current) use of inhaled steroids: Secondary | ICD-10-CM | POA: Diagnosis not present

## 2019-11-02 DIAGNOSIS — I872 Venous insufficiency (chronic) (peripheral): Secondary | ICD-10-CM | POA: Diagnosis not present

## 2019-11-02 DIAGNOSIS — Z9981 Dependence on supplemental oxygen: Secondary | ICD-10-CM | POA: Diagnosis not present

## 2019-11-02 DIAGNOSIS — Z7982 Long term (current) use of aspirin: Secondary | ICD-10-CM | POA: Diagnosis not present

## 2019-11-02 NOTE — Telephone Encounter (Signed)
Signed and placed in outbox.  Thank you. ?

## 2019-11-02 NOTE — Consult Note (Signed)
NEW PATIENT EVALUATION  Name: Andrew Cobb  MRN: [id number]  Date:   11/02/2019     DOB: [birth year]   This 69 y.o. 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  REFERRING PHYSICIAN: [name], NP  CHIEF COMPLAINT:  Chief Complaint  Patient presents with  . Prostate Cancer    DIAGNOSIS: The encounter diagnosis was Malignant neoplasm of prostate ([place]).   PREVIOUS INVESTIGATIONS:  Pathology report reviewed MRI scan reviewed Clinical notes reviewed  HPI: Patient is a 69 year old male originally presented back in 2018 with Gleason six (3+3) adenocarcinoma the prostate in three of 12 core biopsies.  He was under active surveillance although his PSA continues to climb most recently up to seven.  An MRI scan was performed showing a 2.1 cm nodule in the left anterior apex showing extracapsular extension PI-RADS five.  No evidence of neurovascular bundle involvement or pelvic metastatic disease.  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  He has been seen back by urology not thought to be a surgical candidate based on his comorbidities.  He is seen today for radiation collagen opinion.  He is doing well he states he has nocturia x2 some mild urgency and frequency.  His bowel function is good.  He is having no bone pain.  PLANNED TREATMENT REGIMEN: Image guided IMRT radiation therapy  PAST MEDICAL HISTORY:  has a past medical history of Arthritis, Asthma, Cancer ([place]), Chronic venous insufficiency, Coronary artery disease (1996), Diabetes mellitus without complication ([place]), GERD (gastroesophageal reflux disease), Hyperlipidemia, Hypogonadism in male, MRSA (methicillin resistant Staphylococcus aureus) infection, Sleep apnea, Stented coronary artery, Stroke ([place]), and Thrombocythemia ([place]).    PAST SURGICAL  HISTORY:  Past Surgical History:  Procedure Laterality Date  . ANGIOPLASTY    . ANGIOPLASTY     with stent 04/07/1995  . ANTERIOR CERVICAL DECOMPRESSION/DISCECTOMY FUSION 4 LEVELS N/A 08/17/2017   Procedure: Anterior discectomy with fusion and plate fixation Cervical Three-Four, Four-Five, Five-Six, and Six-Seven Fusion;  Surgeon: Ditty, Kevan Ny, MD;  Location: [facility];  Service: Neurosurgery;  Laterality: N/A;  Anterior discectomy with fusion and plate fixation Cervical Three-Four, Four-Five, Five-Six, and Six-Seven Fusion   . APPENDECTOMY     1966  . BUBBLE STUDY  09/20/2018   Procedure: BUBBLE STUDY;  Surgeon: Josue Hector, MD;  Location: [facility];  Service: Cardiovascular;;  . [place]  . CARDIOVASCULAR STRESS TEST     07/31/2011  . CARPAL TUNNEL RELEASE Left   . CHOLECYSTECTOMY     2006  . colonscopy      08/25/2012  . EYE SURGERY Bilateral    cataract  . FUNCTIONAL ENDOSCOPIC SINUS SURGERY     11/10/2013  . GASTRIC BYPASS     10/05/2012  . HERNIA REPAIR     left inguinal 1981  . INCISION AND DRAINAGE ABSCESS Right 02/26/2017   Procedure: INCISION AND DRAINAGE ABSCESS;  Surgeon: Nickie Retort, MD;  Location: [facility];  Service: Urology;  Laterality: Right;  . JOINT REPLACEMENT     bilateral  . left ankle surgery      05/03/2003   . left carpel tunnel      09/18/1993  . left knee meniscal tear      01/25/2010  . left knee meniscal tear repair  05/04/1996  . left rotator cuff repair      05/03/2003   . LOOP RECORDER INSERTION N/A 09/20/2018   Procedure: LOOP RECORDER INSERTION;  Surgeon: Evans Lance, MD;  Location: [facility];  Service: Cardiovascular;  Laterality: N/A;  . REPLACEMENT TOTAL KNEE BILATERAL  07/13/2015  . right ankle surgery      fracture has 2 screws 07/07/1997  . right carpel tunnel      05/16/1992  . right shoulder replacement      01/27/2006  . SCROTAL EXPLORATION Right 02/26/2017   Procedure: SCROTUM  EXPLORATION;  Surgeon: Nickie Retort, MD;  Location: [facility];  Service: Urology;  Laterality: Right;  . TEE WITHOUT CARDIOVERSION N/A 09/20/2018   Procedure: TRANSESOPHAGEAL ECHOCARDIOGRAM (TEE);  Surgeon: Josue Hector, MD;  Location: [facility];  Service: Cardiovascular;  Laterality: N/A;  . TOTAL KNEE ARTHROPLASTY Left 07/13/2015   Procedure: LEFT TOTAL KNEE ARTHROPLASTY;  Surgeon: Gaynelle Arabian, MD;  Location: [facility];  Service: Orthopedics;  Laterality: Left;    FAMILY HISTORY: family history includes Cancer in his brother, brother, mother, and sister; Diabetes in his father and mother; Heart attack in his father and mother; Heart disease in his father and mother; Hyperlipidemia in his father and mother; Hypertension in his father and mother; Pancreatic cancer in his father; Stroke in his father and mother.  SOCIAL HISTORY:  reports that he quit smoking about 35 years ago. His smoking use included cigarettes. He has a 10.00 pack-year smoking history. He has never used smokeless tobacco. He reports that he does not drink alcohol or use drugs.  ALLERGIES: Succinylsulphathiazole, Sulfamethoxazole-trimethoprim, and Tetracyclines & related  MEDICATIONS:  Current Outpatient Medications  Medication Sig Dispense Refill  . albuterol (PROVENTIL) (2.5 MG/3ML) 0.083% nebulizer solution Take 3 mLs (2.5 mg total) by nebulization every 6 (six) hours as needed for wheezing or shortness of breath. 150 mL 1  . albuterol (VENTOLIN HFA) 108 (90 Base) MCG/ACT inhaler Inhale 2 puffs into the lungs every 6 (six) hours as needed for wheezing or shortness of breath. 8 g 1  . amitriptyline (ELAVIL) 25 MG tablet Take 1 tablet (25 mg total) by mouth at bedtime. 90 tablet 3  . aspirin EC 81 MG EC tablet Take 1 tablet (81 mg total) by mouth daily. 30 tablet 0  . atorvastatin (LIPITOR) 40 MG tablet Take 1 tablet (40 mg total) by mouth at bedtime. 90 tablet 2  . cetirizine (ZYRTEC) 10 MG tablet Take 10 mg by mouth  daily.    . Cholecalciferol (VITAMIN D) 2000 units CAPS Take 2,000 Units by mouth daily.    . empagliflozin (JARDIANCE) 10 MG TABS tablet Take 10 mg by mouth daily before breakfast. 90 tablet 3  . glucose blood test strip 1 each by Other route 2 (two) times daily. DX E11.9 100 each 12  . Lancets (ONETOUCH ULTRASOFT) lancets 1 each by Other route 2 (two) times daily. Dx E11.9 100 each 11  . levETIRAcetam (KEPPRA) 500 MG tablet Take 1 tablet (500 mg total) by mouth 2 (two) times daily. 180 tablet 3  . metFORMIN (GLUCOPHAGE) 500 MG tablet Take 2 tablets (1,000 mg total) by mouth 2 (two) times daily with a meal. 360 tablet 2  . Multiple Vitamins-Minerals (MULTIVITAMIN PO) Take 1 tablet by mouth daily.     [name] Kitchen telmisartan (MICARDIS) 80 MG tablet Take 1 tablet (80 mg total) by mouth daily. 30 tablet 2  . valACYclovir (VALTREX) 500 MG tablet Take 1  tablet (500 mg total) by mouth daily. 90 tablet 2  . budesonide-formoterol (SYMBICORT) 160-4.5 MCG/ACT inhaler Inhale 2 puffs into the lungs 2 (two) times daily. (Patient not taking: Reported on 11/02/2019) 1 Inhaler 3  . fluticasone (FLONASE) 50 MCG/ACT nasal spray Place 1 spray into the nose 2 (two) times daily as needed for congestion.    . Magnesium 250 MG TABS Take 250 mg by mouth at bedtime.      No current facility-administered medications for this encounter.    ECOG PERFORMANCE STATUS:  0 - Asymptomatic  REVIEW OF SYSTEMS: Patient has a history of CVAs and some aspiration pneumonia requiring home oxygen which she has been discontinued from. Patient denies any weight loss, fatigue, weakness, fever, chills or night sweats. Patient denies any loss of vision, blurred vision. Patient denies any ringing  of the ears or hearing loss. No irregular heartbeat. Patient denies heart murmur or history of fainting. Patient denies any chest pain or pain radiating to her upper extremities. Patient denies any shortness of breath, difficulty breathing at night, cough or  hemoptysis. Patient denies any swelling in the lower legs. Patient denies any nausea vomiting, vomiting of blood, or coffee ground material in the vomitus. Patient denies any stomach pain. Patient states has had normal bowel movements no significant constipation or diarrhea. Patient denies any dysuria, hematuria or significant nocturia. Patient denies any problems walking, swelling in the joints or loss of balance. Patient denies any skin changes, loss of hair or loss of weight. Patient denies any excessive worrying or anxiety or significant depression. Patient denies any problems with insomnia. Patient denies excessive thirst, polyuria, polydipsia. Patient denies any swollen glands, patient denies easy bruising or easy bleeding. Patient denies any recent infections, allergies or URI. Patient "s visual fields have not changed significantly in recent time.   PHYSICAL EXAM: There were no vitals taken for this visit. Well-developed well-nourished patient in NAD. HEENT reveals PERLA, EOMI, discs not visualized.  Oral cavity is clear. No oral mucosal lesions are identified. Neck is clear without evidence of cervical or supraclavicular adenopathy. Lungs are clear to A&P. Cardiac examination is essentially unremarkable with regular rate and rhythm without murmur rub or thrill. Abdomen is benign with no organomegaly or masses noted. Motor sensory and DTR levels are equal and symmetric in the upper and lower extremities. Cranial nerves II through XII are grossly intact. Proprioception is intact. No peripheral adenopathy or edema is identified. No motor or sensory levels are noted. Crude visual fields are within normal range.  LABORATORY DATA: Pathology report from 2018 reviewed    RADIOLOGY RESULTS: MRI scan reviewed compatible with above-stated findings   IMPRESSION: Stage III Gleason six adenocarcinoma the prostate in 69 year old male presenting with an elevated PSA of seven  PLAN: This time of run the  [place] a 41% chance only of organ confined disease only 2% chance of lymph node involvement.  I have recommend image guided IMRT radiation therapy up to 8000 cGy over 8 weeks.  I have asked Dr. Lorenza Chick to place fiducial markers in his prostate for daily image guided treatment.  Risks and benefits of treatment including increased lower urinary tract symptoms fatigue diarrhea skin reaction all were discussed in detail with the patient.  He seems to comprehend my treatment plan well.  We will set him up for simulation after we have a specific date for his marker placement.  Do not see a reason based on his normal [facility] nomogram to  treat his pelvic lymph nodes.  Patient comprehends my treatment plan well.  I would like to take this opportunity to thank you for allowing me to participate in the care of your patient.Noreene Filbert, MD

## 2019-11-02 NOTE — Progress Notes (Signed)
ILR Remote 

## 2019-11-03 NOTE — Telephone Encounter (Signed)
Fax confirmation received [place], urological assoc [phone]. [phone].

## 2019-11-04 ENCOUNTER — Ambulatory Visit: Payer: PPO | Admitting: Internal Medicine

## 2019-11-04 ENCOUNTER — Ambulatory Visit: Payer: PPO

## 2019-11-04 ENCOUNTER — Encounter: Payer: Self-pay | Admitting: Internal Medicine

## 2019-11-04 ENCOUNTER — Telehealth: Payer: Self-pay | Admitting: *Deleted

## 2019-11-04 ENCOUNTER — Other Ambulatory Visit: Payer: Self-pay

## 2019-11-04 VITALS — BP 88/62 | HR 69 | Temp 98.0°F | Ht 70.0 in | Wt 196.4 lb

## 2019-11-04 DIAGNOSIS — I10 Essential (primary) hypertension: Secondary | ICD-10-CM | POA: Diagnosis not present

## 2019-11-04 DIAGNOSIS — E1159 Type 2 diabetes mellitus with other circulatory complications: Secondary | ICD-10-CM | POA: Diagnosis not present

## 2019-11-04 DIAGNOSIS — R918 Other nonspecific abnormal finding of lung field: Secondary | ICD-10-CM | POA: Diagnosis not present

## 2019-11-04 DIAGNOSIS — R0600 Dyspnea, unspecified: Secondary | ICD-10-CM

## 2019-11-04 DIAGNOSIS — R0609 Other forms of dyspnea: Secondary | ICD-10-CM

## 2019-11-04 DIAGNOSIS — J45909 Unspecified asthma, uncomplicated: Secondary | ICD-10-CM

## 2019-11-04 NOTE — Procedures (Signed)
   HISTORY: 69 year old male presented with altered mental status, staring spells  TECHNIQUE:  This is a routine 16 channel EEG recording with one channel devoted to a limited EKG recording.  It was performed during wakefulness, drowsiness and asleep.  Hyperventilation and photic stimulation were performed as activating procedures.  There are minimum muscle and movement artifact noted.  Upon maximum arousal, posterior dominant waking rhythm consistent of rhythmic alpha range activity, with frequency of 10 hz. Activities are symmetric over the bilateral posterior derivations and attenuated with eye opening.  Hyperventilation produced mild/moderate buildup with higher amplitude and the slower activities noted.  Photic stimulation did not alter the tracing.  During EEG recording, patient developed drowsiness and entered sleep, sleep EEG demonstrated architecture, there were frontal centrally dominant vertex waves and symmetric sleep spindles noted.  During EEG recording, there was no epileptiform discharge noted.  EKG demonstrate sinus rhythm, with heart rate of 72 bpm  CONCLUSION: This is a  normal wake and sleep EEG.  There is no electrodiagnostic evidence of epileptiform discharge.  Marcial Pacas, M.D. Ph.D.  [facility] [address], [place] [address] Phone: [phone] Fax:      [phone]

## 2019-11-04 NOTE — Patient Instructions (Signed)
Reduce telmasartan to 80 mg one half daily    Please schedule a follow up visit in 2  months but call sooner if needed with cxr and pfts

## 2019-11-04 NOTE — Progress Notes (Signed)
Andrew Cobb, male    DOB: 27-Jul-1950,    MRN: [id number]   Brief patient profile:  59 yowm quit smoking 1981 with no resp problems at all but around 2018-19 while on lisinopril started needing inhalers for colds then symbicort started 2020 then "sinus infection" and dx covid Pos 08/26/19 with resolution of symptoms s rx  then admitted 09/07/19:    Admit date: 09/07/2019 Discharge date: 09/16/2019  Admitted From: Home Disposition: Home with home health  Recommendations for Outpatient Follow-up:  1. Follow up with PCP in 1-2 weeks 2. Please obtain BMP/CBC in one week your next doctors visit.  3. Keppra 500 mg twice daily 4. Follow-up patient neurology in 4 weeks 5. Recommended against driving, instructions have been given to him.  He is not allowed to drive at least until seen by outpatient neurology and cleared by them. 6. Supplemental oxygen for home has been arranged for 7. 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 MRI showed acute CVA along with chronic changes, neurology team was consulted. There was also concerns of seizure therefore started on Keppra. CT of the chest without contrast showed groundglass opacity concerns for pneumonitis/pneumonia.  Despite of several days of bronchodilator treatment, he still remained hypoxic without any evidence of fluid overload or infection.  Suspect is this aspiration pneumonitis causing his hypoxia therefore home oxygen has been arranged for.  Remains medically stable at this time.  His care has been extensively discussed by me with his wife.  Acute encephalopathy, persist Acute seizures, Acute CVA 70mm left parietal white matter per MRI but neurology  thinks is chronic.  History of recurrent embolic infarcts/CVA with hemorrhagic conversion in July 2020 -Status post loading dose of Keppra, Keppra 500 mg twice daily -EEG-negative for seizures but shows encephalopathy. Continue seizure precaution -Per neurology team patient has chronic CVA.  Follow-up outpatient with neurology.  Recommends against driving, he has been instructed about this.  Wife knows about this as well.  Patient and wife "understands this. -Aspirin 81 mg, atorvastatin 40 mg daily -CT head-negative -UA-shows glucosuria but no evidence of infection  Bilateral multifocal infiltrate Aspiration pneumonitis/pneumonia in the setting of seizure -Ambulatory pulse ox still dropping down to 83% requiring 4 L nasal cannula.  Home oxygen arranged for.  No shortness of breath with ambulation at all.  Doing well with incentive spirometer. -COVID-19-negative  -Procalcitonin-negative, BNP Normal. Discontinue Zosyn after today -Respiratory panel-negative -Echocardiogram March 2020-EF 60 to 65%, mild LVH -I-S//flutter. Scheduled and as needed bronchodilators -CT chest consistent with bilateral infiltrates-aspiration? -aspiration precautions. -Seen by speech team status post MBS-recommending regular diet  Acute kidney injury, prerenal -Appears to have resolved. Creatinine at baseline around 1.1  Coronary artery disease status post PCI -Currently chest pain-free. Continue aspirin and statin  Essential hypertension -Benzapril on hold due to AKI  D M2, poorly controlled due to hyperglycemia -Home p.o. meds on hold, A1c 9.2 -Resume his home regimen  Hyperlipidemia -Statin  OSA -CPAP  PT-Home health-PT/OT/RN.     Discharge Diagnoses:  Principal Problem:   Altered mental status   OSA (obstructive sleep apnea)   Hypertension associated with diabetes ([place])   Uncontrolled type 2 diabetes mellitus with hyperglycemia, without long-term current use of insulin  (  [place])   Coronary artery disease   Hyperlipidemia associated with type 2 diabetes mellitus ([place])   History of CVA (cerebrovascular accident)   Acute encephalopathy   Bilateral pneumonia   Subjective: Patient feels great, he wants to go home today.  Denies any shortness of breath.  Does have still some hypoxia with ambulation very requiring 4 L nasal cannula.  Overall he feels great.    History of Present Illness  10/14/2019  Pulmonary/ 1st office eval/Aleeyah Bensen  Chief Complaint  Patient presents with  . Consult  Dyspnea:  Walking around outside x 20 minutes picking up sticks s 02  Cough: variable severe dry assoc hoarseness  Sleep: 2lpm hs on side / bed is flat/ one pillow SABA use: neb at least once daily rec  Goal for 02 is to keep saturation above 90%  Plan A = Automatic = Always=    Symbicort 160 Take 1-2 puffs first thing in am and then another 2 puffs about 12 hours later.  Work on inhaler technique:   Plan B = Backup (to supplement plan A, not to replace it) Only use your albuterol inhaler as a rescue medication  Plan C = Crisis (instead of Plan B but only if Plan B stops working) - only use your albuterol nebulizer if you first try Plan B and it fails to help > ok to use the nebulizer up to every 4 hours but if start needing it regularly call for immediate appointment Stop benazpril and start telmasartan 80 mg daily - take one half if too low    11/04/2019  f/u ov/Ruberta Holck re: uacs/ off all 02 and inhalers  Chief Complaint  Patient presents with  . Follow-up   Dyspnea:  Not limited by breathing from desired activities   Cough: gone Sleeping: fine flat SABA use: none  No longer using 02     No obvious day to day or daytime variability or assoc excess/ purulent sputum or mucus plugs or hemoptysis or cp or chest tightness, subjective wheeze or overt sinus or hb symptoms.   Sleeping  without nocturnal  or early am exacerbation  of respiratory  c/o's or need for noct saba. Also  denies any obvious fluctuation of symptoms with weather or environmental changes or other aggravating or alleviating factors except as outlined above   No unusual exposure hx or h/o childhood pna/ asthma or knowledge of premature birth.  Current Allergies, Complete Past Medical History, Past Surgical History, Family History, and Social History were reviewed in [employer] record.  ROS  The following are not active complaints unless bolded Hoarseness, sore throat, dysphagia, dental problems, itching, sneezing,  nasal congestion or discharge of excess mucus or purulent secretions, ear ache,   fever, chills, sweats, unintended wt loss or wt gain, classically pleuritic or exertional cp,  orthopnea pnd or arm/hand swelling  or leg swelling, presyncope, palpitations, abdominal pain, anorexia, nausea, vomiting, diarrhea  or change in bowel habits or change in bladder habits, change in stools or change in urine, dysuria, hematuria,  rash, arthralgias, visual complaints, headache, numbness, weakness or ataxia or problems with walking or coordination,  change in mood or  memory.        Current Meds  Medication Sig  . amitriptyline (ELAVIL) 25 MG tablet Take 1 tablet (25 mg total) by mouth at bedtime.  . Ascorbic Acid (VITAMIN C) 1000 MG tablet Take 1,000 mg by mouth daily.  [name] Kitchen aspirin EC 81 MG EC tablet Take 1 tablet (  81 mg total) by mouth daily.  [name] Kitchen atorvastatin (LIPITOR) 40 MG tablet Take 1 tablet (40 mg total) by mouth at bedtime.  . cetirizine (ZYRTEC) 10 MG tablet Take 10 mg by mouth daily.  . Cholecalciferol (VITAMIN D) 2000 units CAPS Take 2,000 Units by mouth daily.  . empagliflozin (JARDIANCE) 10 MG TABS tablet Take 10 mg by mouth daily before breakfast.  . glucose blood test strip 1 each by Other route 2 (two) times daily. DX E11.9  . Lancets (ONETOUCH ULTRASOFT) lancets 1 each by Other route 2 (two) times daily. Dx E11.9  . levETIRAcetam (KEPPRA) 500 MG tablet Take 1 tablet  (500 mg total) by mouth 2 (two) times daily.  . metFORMIN (GLUCOPHAGE) 500 MG tablet Take 2 tablets (1,000 mg total) by mouth 2 (two) times daily with a meal.  . Multiple Vitamins-Minerals (MULTIVITAMIN PO) Take 1 tablet by mouth daily.   [name] Kitchen telmisartan (MICARDIS) 80 MG tablet Take 1 tablet (80 mg total) by mouth daily.  . valACYclovir (VALTREX) 500 MG tablet Take 1 tablet (500 mg total) by mouth daily.  . [DISCONTINUED] Magnesium 250 MG TABS Take 250 mg by mouth at bedtime.           .              Past Medical History:  Diagnosis Date  . Arthritis   . Asthma   . Cancer [facility])    prostate  . Chronic venous insufficiency    varicose vein lower extremity with inflammation  . Coronary artery disease 1996   two stents placed   . Diabetes mellitus without complication ([place])    type 2 on metformin  . GERD (gastroesophageal reflux disease)    no issues since gastric bypass surgery as stated per pt  . Hyperlipidemia   . Hypogonadism in male   . MRSA (methicillin resistant Staphylococcus aureus) infection    07/30/2008 thru 08/07/2008  . Sleep apnea    on BIPAP  . Stented coronary artery   . Stroke ([place])   . Thrombocythemia ([place])            Objective:    Mod hoarse amb wm    Wt Readings from Last 3 Encounters:  11/04/19 196 lb 6.4 oz (89.1 kg)  10/18/19 192 lb (87.1 kg)  10/14/19 190 lb (86.2 kg)     Vital signs reviewed  11/04/2019  - Note at rest 02 sats  96 % on RA and BP 88/62 after avapro 300 mg in am    HEENT : pt wearing mask not removed for exam due to covid -19 concerns.    NECK :  without JVD/Nodes/TM/ nl carotid upstrokes bilaterally   LUNGS: no acc muscle use,  Nl contour chest which is clear to A and P bilaterally without cough on insp or exp maneuvers   CV:  RRR  no s3 or murmur or increase in P2, and no edema   ABD:  soft and nontender with nl inspiratory excursion in the supine position. No bruits or organomegaly appreciated, bowel sounds nl  MS:   Nl gait/ ext warm without deformities, calf tenderness, cyanosis or clubbing No obvious joint restrictions   SKIN: warm and dry without lesions    NEURO:  alert, approp, nl sensorium with  no motor or cerebellar deficits apparent.                 Assessment

## 2019-11-04 NOTE — Telephone Encounter (Signed)
Advised patient wife to tell him to stop aspirin,  7 days prior to [employer]. A fleet enema 2 hours before appt time.

## 2019-11-05 ENCOUNTER — Encounter: Payer: Self-pay | Admitting: Internal Medicine

## 2019-11-05 MED ORDER — TELMISARTAN 80 MG PO TABS: ORAL_TABLET | ORAL | 2 refills | Status: DC

## 2019-11-05 NOTE — Assessment & Plan Note (Addendum)
Onset aroiund 2018 on ACEi attributed to asthma > d/c'd ACEi 10/14/2019  - 10/14/2019   Walked RA x  3 laps =  approx 758ft @ moderate pace - stopped due to end of study  with sats of 90 % at the end of the study and no sob   Much better,  No longer using any 02 or inhalers with f/u pfts next ov for baseline off all rx but I would not be surprised to find he has little to no copd.

## 2019-11-05 NOTE — Assessment & Plan Note (Signed)
See admit 09/06/19 > marked improvement on cxr 10/14/2019   Clinically much better >>> final f/u  cxr due at next ov  With baseline pfts also

## 2019-11-05 NOTE — Assessment & Plan Note (Addendum)
Changed ACEi to ARB 10/14/2019 due to hoarseness/ psueudoasthma> resolved as of 11/04/2019   Although even in retrospect it may not be clear the ACEi contributed to the pt's symptoms,  Pt improved off convincingly  them and adding them back at this point or in the future would risk confusion in interpretation of non-specific respiratory symptoms to which this patient is prone  ie  Better not to muddy the waters here.   >>>  Over rx on 80 mg telmasartan >  rec one half daily and f/u PCP           Each maintenance medication was reviewed in detail including emphasizing most importantly the difference between maintenance and prns and under what circumstances the prns are to be triggered using an action plan format where appropriate.  Total time for H and P, chart review, counseling,   and generating customized AVS unique to this office visit / charting = 20 min

## 2019-11-07 ENCOUNTER — Telehealth: Payer: Self-pay

## 2019-11-07 NOTE — Telephone Encounter (Signed)
Pt verified by name and DOB, results given per provider, pt voiced understanding all question answered. 

## 2019-11-09 ENCOUNTER — Ambulatory Visit (INDEPENDENT_AMBULATORY_CARE_PROVIDER_SITE_OTHER): Payer: PPO | Admitting: Pharmacist

## 2019-11-09 ENCOUNTER — Telehealth: Payer: Self-pay | Admitting: Internal Medicine

## 2019-11-09 DIAGNOSIS — E1169 Type 2 diabetes mellitus with other specified complication: Secondary | ICD-10-CM | POA: Diagnosis not present

## 2019-11-09 DIAGNOSIS — Z955 Presence of coronary angioplasty implant and graft: Secondary | ICD-10-CM | POA: Diagnosis not present

## 2019-11-09 DIAGNOSIS — I69328 Other speech and language deficits following cerebral infarction: Secondary | ICD-10-CM | POA: Diagnosis not present

## 2019-11-09 DIAGNOSIS — J4520 Mild intermittent asthma, uncomplicated: Secondary | ICD-10-CM | POA: Diagnosis not present

## 2019-11-09 DIAGNOSIS — I6930 Unspecified sequelae of cerebral infarction: Secondary | ICD-10-CM | POA: Diagnosis not present

## 2019-11-09 DIAGNOSIS — I10 Essential (primary) hypertension: Secondary | ICD-10-CM | POA: Diagnosis not present

## 2019-11-09 DIAGNOSIS — E1151 Type 2 diabetes mellitus with diabetic peripheral angiopathy without gangrene: Secondary | ICD-10-CM | POA: Diagnosis not present

## 2019-11-09 DIAGNOSIS — M1990 Unspecified osteoarthritis, unspecified site: Secondary | ICD-10-CM | POA: Diagnosis not present

## 2019-11-09 DIAGNOSIS — E1165 Type 2 diabetes mellitus with hyperglycemia: Secondary | ICD-10-CM | POA: Diagnosis not present

## 2019-11-09 DIAGNOSIS — Z8546 Personal history of malignant neoplasm of prostate: Secondary | ICD-10-CM | POA: Diagnosis not present

## 2019-11-09 DIAGNOSIS — Z96653 Presence of artificial knee joint, bilateral: Secondary | ICD-10-CM | POA: Diagnosis not present

## 2019-11-09 DIAGNOSIS — Z8701 Personal history of pneumonia (recurrent): Secondary | ICD-10-CM | POA: Diagnosis not present

## 2019-11-09 DIAGNOSIS — Z9181 History of falling: Secondary | ICD-10-CM | POA: Diagnosis not present

## 2019-11-09 DIAGNOSIS — Z7951 Long term (current) use of inhaled steroids: Secondary | ICD-10-CM | POA: Diagnosis not present

## 2019-11-09 DIAGNOSIS — I639 Cerebral infarction, unspecified: Secondary | ICD-10-CM | POA: Diagnosis not present

## 2019-11-09 DIAGNOSIS — E785 Hyperlipidemia, unspecified: Secondary | ICD-10-CM | POA: Diagnosis not present

## 2019-11-09 DIAGNOSIS — G4733 Obstructive sleep apnea (adult) (pediatric): Secondary | ICD-10-CM | POA: Diagnosis not present

## 2019-11-09 DIAGNOSIS — Z7982 Long term (current) use of aspirin: Secondary | ICD-10-CM | POA: Diagnosis not present

## 2019-11-09 DIAGNOSIS — K219 Gastro-esophageal reflux disease without esophagitis: Secondary | ICD-10-CM | POA: Diagnosis not present

## 2019-11-09 DIAGNOSIS — I2510 Atherosclerotic heart disease of native coronary artery without angina pectoris: Secondary | ICD-10-CM

## 2019-11-09 DIAGNOSIS — J449 Chronic obstructive pulmonary disease, unspecified: Secondary | ICD-10-CM | POA: Diagnosis not present

## 2019-11-09 DIAGNOSIS — Z7984 Long term (current) use of oral hypoglycemic drugs: Secondary | ICD-10-CM | POA: Diagnosis not present

## 2019-11-09 DIAGNOSIS — I2584 Coronary atherosclerosis due to calcified coronary lesion: Secondary | ICD-10-CM | POA: Diagnosis not present

## 2019-11-09 DIAGNOSIS — I872 Venous insufficiency (chronic) (peripheral): Secondary | ICD-10-CM | POA: Diagnosis not present

## 2019-11-09 DIAGNOSIS — Z9981 Dependence on supplemental oxygen: Secondary | ICD-10-CM | POA: Diagnosis not present

## 2019-11-09 NOTE — Telephone Encounter (Signed)
I don't have a CMN for this patient I have called Mariann Laster with [facility] about this CMN asking her where it was faxed and who ordered the 02. LVM for Mariann Laster to call me back about this CMN

## 2019-11-09 NOTE — Patient Instructions (Signed)
Visit Information  Goals Addressed            This Visit's Progress     Patient Stated   . "I want to work on my diabetes" (pt-stated)       Current Barriers:  . Diabetes: uncontrolled, most recent A1c 7.1% ; goal <6.5% per neurology; complicated by hx CVA, possible seizure activity, prostate cancer o Reports appointment w/ urology on 5/20 for seed placement, then starting radiation on 5/24. Reports he will have 40 treatment sessions.  o Reports he has not needed oxygen therapy in over 2 weeks. Switch from ACEi to ARB benefited breathing and cough . Current antihyperglycemic regimen: metformin 1000 mg BID, Jardiance 10 mg daily  o APPROVED for Jardiance assistance through 07/06/20 . Current glucose readings:  o Fasting 110-120s, not checking post prandials . Current exercise regimen:  o Notes he was previously doing water aerobics at [facility], but hasn't had time to go recently with so many doctor's appointments. Also wants to get back into walking regularly.  . 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 Seizure activity: levatiracetam 500 mg BID. Outpatient EEG normal. Neurology will re-evaluate need for this therapy moving forward if repeat EEG normal  Pharmacist Clinical Goal(s):  [name] Kitchen Over the next 90 days, patient with work with PharmD and primary care provider to address optimized hyperglycemic management  Interventions: . Comprehensive medication review performed, medication list updated in electronic medical record . Inter-disciplinary care team collaboration (see longitudinal plan of care) . Reviewed goal A1c, goal fasting, and goal 2 hour post prandial glucose. Encouraged  to start checking post prandial readings. If next A1c elevated, recommend maximizing Jardiance to 25 mg daily. Updated prescription can be called into [facility] Cares patient assistance program.  . Encouraged continued home BP monitoring periodically to ensure maintenance of control  Patient Self Care Activities:  . Patient will check blood glucose daily, document, and provide at future appointments . Patient will take medications as prescribed . Patient will report any questions or concerns to provider   Please see past updates related to this goal by clicking on the "Past Updates" button in the selected goal         Patient verbalizes understanding of instructions provided today.   Plan:  - Scheduled f/u call in ~12 weeks  Catie Darnelle Maffucci, PharmD, [place] [phone]

## 2019-11-09 NOTE — Chronic Care Management (AMB) (Signed)
Chronic Care Management   Follow Up Note   11/09/2019 Name: Andrew Cobb MRN: [id number] DOB: 1951-01-27  Referred by: [name], NP Reason for referral : Chronic Care Management (Medication Management)   Andrew Cobb is a 69 y.o. year old male who is a primary care patient of [name], [name], NP. The CCM team was consulted for assistance with chronic disease management and care coordination needs.    Contacted patient for medication management review.   Review of patient status, including review of consultants reports, relevant laboratory and other test results, and collaboration with appropriate care team members and the patient's provider was performed as part of comprehensive patient evaluation and provision of chronic care management services.    SDOH (Social Determinants of Health) assessments performed: Yes See Care Plan activities for detailed interventions related to [facility])     Outpatient Encounter Medications as of 11/09/2019  Medication Sig  . amitriptyline (ELAVIL) 25 MG tablet Take 1 tablet (25 mg total) by mouth at bedtime.  . Ascorbic Acid (VITAMIN C) 1000 MG tablet Take 1,000 mg by mouth daily.  [name] Kitchen aspirin EC 81 MG EC tablet Take 1 tablet (81 mg total) by mouth daily.  [name] Kitchen atorvastatin (LIPITOR) 40 MG tablet Take 1 tablet (40 mg total) by mouth at bedtime.  . cetirizine (ZYRTEC) 10 MG tablet Take 10 mg by mouth daily.  . Cholecalciferol (VITAMIN D) 2000 units CAPS Take 2,000 Units by mouth daily.  . empagliflozin (JARDIANCE) 10 MG TABS tablet Take 10 mg by mouth daily before breakfast.  . glucose blood test strip 1 each by Other route 2 (two) times daily. DX E11.9  . Lancets (ONETOUCH ULTRASOFT) lancets 1 each by Other route 2 (two) times daily. Dx E11.9  . levETIRAcetam (KEPPRA) 500 MG tablet Take 1 tablet (500 mg total) by mouth 2 (two) times daily.  . metFORMIN (GLUCOPHAGE) 500 MG tablet Take 2 tablets (1,000 mg total) by mouth 2 (two) times daily with a  meal.  . Multiple Vitamins-Minerals (MULTIVITAMIN PO) Take 1 tablet by mouth daily.   [name] Kitchen telmisartan (MICARDIS) 80 MG tablet Take one half daily  . valACYclovir (VALTREX) 500 MG tablet Take 1 tablet (500 mg total) by mouth daily.  . [DISCONTINUED] fluticasone (FLONASE) 50 MCG/ACT nasal spray Place 1 spray into the nose 2 (two) times daily as needed for congestion.   No facility-administered encounter medications on file as of 11/09/2019.     Objective:   Goals Addressed            This Visit's Progress     Patient Stated   . "I want to work on my diabetes" (pt-stated)       Current Barriers:  . Diabetes: uncontrolled, most recent A1c 7.1% ; goal <6.5% per neurology; complicated by hx CVA, possible seizure activity, prostate cancer o Reports appointment w/ urology on 5/20 for seed placement, then starting radiation on 5/24. Reports he will have 40 treatment sessions.  o Reports he has not needed oxygen therapy in over 2 weeks. Switch from ACEi to ARB benefited breathing and cough . Current antihyperglycemic regimen: metformin 1000 mg BID, Jardiance 10 mg daily  o APPROVED for Jardiance assistance through 07/06/20 . Current glucose readings:  o Fasting 110-120s, not checking post prandials . Current exercise regimen:  o Notes he was previously doing water aerobics at [facility], but hasn't had time to go recently with so many doctor's appointments. Also wants to get back into walking regularly.  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 Seizure activity: levatiracetam 500 mg BID. Outpatient EEG normal. Neurology will re-evaluate need for this therapy moving forward if repeat EEG  normal  Pharmacist Clinical Goal(s):  [name] Kitchen Over the next 90 days, patient with work with PharmD and primary care provider to address optimized hyperglycemic management  Interventions: . Comprehensive medication review performed, medication list updated in electronic medical record . Inter-disciplinary care team collaboration (see longitudinal plan of care) . Reviewed goal A1c, goal fasting, and goal 2 hour post prandial glucose. Encouraged to start checking post prandial readings. If next A1c elevated, recommend maximizing Jardiance to 25 mg daily. Updated prescription can be called into [facility] Cares patient assistance program.  . Encouraged continued home BP monitoring periodically to ensure maintenance of control  Patient Self Care Activities:  . Patient will check blood glucose daily, document, and provide at future appointments . Patient will take medications as prescribed . Patient will report any questions or concerns to provider   Please see past updates related to this goal by clicking on the "Past Updates" button in the selected goal          Plan:  - Scheduled f/u call in ~12 weeks  Catie Darnelle Maffucci, PharmD, [place] [phone]

## 2019-11-11 NOTE — Telephone Encounter (Signed)
It looks like Dr. Golden Pop in [place] signed a CMN for this patient on 10/25/2019 sorry actually that form has Ashok Croon signature on it

## 2019-11-11 NOTE — Telephone Encounter (Signed)
Did you ever receive the CMN on this?

## 2019-11-11 NOTE — Telephone Encounter (Signed)
No I have never received this CMN and it is from the same office [facility]

## 2019-11-14 DIAGNOSIS — C61 Malignant neoplasm of prostate: Secondary | ICD-10-CM | POA: Diagnosis not present

## 2019-11-14 NOTE — Telephone Encounter (Signed)
Krystal Eaton at [phone] left detailed message about message from [place]:  It looks like Dr. Golden Pop in [place] signed a CMN for this patient on 10/25/2019 sorry actually that form has Ashok Croon signature on it  Nothing further needed at this time.

## 2019-11-20 DIAGNOSIS — I2510 Atherosclerotic heart disease of native coronary artery without angina pectoris: Secondary | ICD-10-CM | POA: Diagnosis not present

## 2019-11-20 DIAGNOSIS — J189 Pneumonia, unspecified organism: Secondary | ICD-10-CM | POA: Diagnosis not present

## 2019-11-23 ENCOUNTER — Ambulatory Visit: Payer: Self-pay | Admitting: General Practice

## 2019-11-23 ENCOUNTER — Telehealth: Payer: Self-pay | Admitting: General Practice

## 2019-11-23 ENCOUNTER — Telehealth: Payer: Self-pay | Admitting: Urology

## 2019-11-23 DIAGNOSIS — E1165 Type 2 diabetes mellitus with hyperglycemia: Secondary | ICD-10-CM

## 2019-11-23 DIAGNOSIS — I2510 Atherosclerotic heart disease of native coronary artery without angina pectoris: Secondary | ICD-10-CM

## 2019-11-23 DIAGNOSIS — E785 Hyperlipidemia, unspecified: Secondary | ICD-10-CM

## 2019-11-23 DIAGNOSIS — Z8546 Personal history of malignant neoplasm of prostate: Secondary | ICD-10-CM

## 2019-11-23 DIAGNOSIS — E1169 Type 2 diabetes mellitus with other specified complication: Secondary | ICD-10-CM

## 2019-11-23 DIAGNOSIS — I2584 Coronary atherosclerosis due to calcified coronary lesion: Secondary | ICD-10-CM

## 2019-11-23 DIAGNOSIS — I10 Essential (primary) hypertension: Secondary | ICD-10-CM

## 2019-11-23 DIAGNOSIS — J4520 Mild intermittent asthma, uncomplicated: Secondary | ICD-10-CM

## 2019-11-23 NOTE — Patient Instructions (Signed)
Visit Information  Goals Addressed            This Visit's Progress   . RNCM: "I am feeling better since getting out of the hospital" (pt-stated)       CARE PLAN ENTRY (see longtitudinal plan of care for additional care plan information)  Current Barriers:  . Chronic Disease Management support, education, and care coordination needs related to HTN, HLD, DMII, and Pulmonary Disease  Clinical Goal(s) related to HTN, HLD, DMII, and Pulmonary Disease:  Over the next 120 days, patient will:  . Work with the care management team to address educational, disease management, and care coordination needs  . Begin or continue self health monitoring activities as directed today Measure and record cbg (blood glucose) 2 times daily, Measure and record blood pressure 5 times per week, and adhere to a Heart healthy/ADA diet . Call provider office for new or worsened signs and symptoms Blood glucose findings outside established parameters, Blood pressure findings outside established parameters, Oxygen saturation lower than established parameter, Shortness of breath, and New or worsened symptom related to recent hospitalization due to pneumonia and the need to come home on supplemental oxygen . Call care management team with questions or concerns . Verbalize basic understanding of patient centered plan of care established today  Interventions related to HTN, HLD, DMII, and Pulmonary Disease:  . Evaluation of current treatment plans and patient's adherence to plan as established by provider. Patient saw pcp 09/19/2019 post discharge from the hospital. The patient states he is doing much better and is not even having to use the oxygen as much. His oxygen sats are staying 95 to 98%.  Is using the oxygen 2 liters via [place] at night. The patient also verbalized that his blood sugars have come down. States they have been in the 120's to 130's.  Patient verbalized his blood pressure is "great". The patient verbalized  today that he is no longer using oxygen at all and is turning it back in. The patient was at the gym working out. He is doing 5 miles a day at the gym.  His blood sugars are 105-110 and his blood pressure is systolic 107-110 and diastolic 70-73.  Praised for keeping good track of his numbers. Praised for exercise goal being met. The patient verbalized he feels great.  . Assessed patient understanding of disease states: Patient states he understands his conditions well and is compliant with plan of care and medication regimen  . Assessed patient's education and care coordination needs- working with CCM pharmacist. Denies any needs at this time . Provided disease specific education to patient: Education and support given on Heart healthy/ADA diet. The patient states he does not eat salt.  . Collaborated with appropriate clinical care team members regarding patient needs  Patient Self Care Activities related to HTN, HLD, DMII, and Pulmonary Disease:  . Patient is unable to independently self-manage chronic health conditions  Please see past updates related to this goal by clicking on the "Past Updates" button in the selected goal      . RNCM: Pt-"I have prostate cancer" (pt-stated)       CARE PLAN ENTRY (see longitudinal plan of care for additional care plan information)  Current Barriers:  . Knowledge Deficits related to pathophysiology of prostate cancer . Care Coordination needs related to treatment and support in a patient with prostate cancer (disease states) . Chronic Disease Management support and education needs related to prostate cancer  Nurse Case Manager Clinical Goal(s):  .   Over the next 120 days, patient will verbalize understanding of plan for treatment of prostate cancer . Over the next 120 days, patient will work with RNCM, CCM team, and pcp to address needs related to prostate cancer  . Over the next 120 days, patient will attend all scheduled medical appointments: the patient  has several upcoming appointments for treatment of prostate cancer. The patient is happy that he will be able to take his treatments in [place] . Over the next 120 days, patient will verbalize basic understanding of prostate disease process and self health management plan as evidenced by remaining independent during treatments and notifying team of changes in condition.   Interventions:  . Inter-disciplinary care team collaboration (see longitudinal plan of care) . Evaluation of current treatment plan related to prostate cancer treatment  and patient's adherence to plan as established by provider. . Advised patient to call the RNCM or pcp for any needs or changes in condition . Provided education to patient re: taking medications as prescribed, following recommendations of cancer specialist, and keeping appointments  . Discussed plans with patient for ongoing care management follow up and provided patient with direct contact information for care management team . Reviewed scheduled/upcoming provider appointments including: The patient has several upcoming appointments. The patient will have seed placement on 11/24/2019 and will start 40 treatments of radiation on 11/28/2019.  He will go for radiation 5 days a week for a total of 40 treatments.  . Review of support system and available referrals to help the patient. The patient verbalized he is doing well now but if he has any needs he will let the RNCM know.   Patient Self Care Activities:  . Patient verbalizes understanding of plan to effectively treat prostate cancer . Self administers medications as prescribed . Attends all scheduled provider appointments . Performs ADL's independently . Performs IADL's independently . Calls provider office for new concerns or questions . Unable to independently manage prostate cancer   Initial goal documentation        Patient verbalizes understanding of instructions provided today.   The care  management team will reach out to the patient again over the next 60 to 90 days.   Pam Tate RN, MSN, CCM Community Care Coordinator [place]  [facility] Mobile: [phone]   

## 2019-11-23 NOTE — Progress Notes (Signed)
11/24/19  CC: gold seed markers  HPI: 69 y.o. male with prostate cancer who presents today for placement of fiducial seed markers in anticipation of his upcoming IMRT with Dr. Baruch Gouty.  Prostate Gold Seed Marker Placement Procedure   Informed consent was obtained after discussing risks/benefits of the procedure.  A time out was performed to ensure correct patient identity.  Pre-Procedure: - Gentamicin given prophylactically - PO Levaquin 500 mg also given today  Procedure: -Lidocaine jelly was administered per rectum -Rectal ultrasound probe was placed without difficulty and the prostate visualized - 3 fiducial gold seed markers placed, one at right base, one at left base, one at apex of prostate gland under transrectal ultrasound guidance  Post-Procedure: - Patient tolerated the procedure well - He was counseled to seek immediate medical attention if experiences any severe pain, significant bleeding, or fevers  I, Nethusan Sivanesan, am acting as a scribe for Dr. Nicki Reaper C. Japheth Diekman,  I have reviewed the above documentation for accuracy and completeness, and I agree with the above.   Abbie Sons, MD

## 2019-11-23 NOTE — Chronic Care Management (AMB) (Signed)
Chronic Care Management   Follow Up Note   11/23/2019 Name: Andrew Cobb MRN: [id number] DOB: May 29, 1951  Referred by: Andrew Lick, NP Reason for referral : Chronic Care Management (Follow up Call: DM2/CAD/H/O CVA - prostate cancer/new concerns)   Andrew Cobb is a 69 y.o. year old male who is a primary care patient of [name], Andrew Faster, NP. The CCM team was consulted for assistance with chronic disease management and care coordination needs.    Review of patient status, including review of consultants reports, relevant laboratory and other test results, and collaboration with appropriate care team members and the patient's provider was performed as part of comprehensive patient evaluation and provision of chronic care management services.    SDOH (Social Determinants of Health) assessments performed: Yes- Praised for increasing activity level See Care Plan activities for detailed interventions related to [facility])     Outpatient Encounter Medications as of 11/23/2019  Medication Sig  . amitriptyline (ELAVIL) 25 MG tablet Take 1 tablet (25 mg total) by mouth at bedtime.  . Ascorbic Acid (VITAMIN C) 1000 MG tablet Take 1,000 mg by mouth daily.  [name] Kitchen aspirin EC 81 MG EC tablet Take 1 tablet (81 mg total) by mouth daily.  [name] Kitchen atorvastatin (LIPITOR) 40 MG tablet Take 1 tablet (40 mg total) by mouth at bedtime.  . cetirizine (ZYRTEC) 10 MG tablet Take 10 mg by mouth daily.  . Cholecalciferol (VITAMIN D) 2000 units CAPS Take 2,000 Units by mouth daily.  . empagliflozin (JARDIANCE) 10 MG TABS tablet Take 10 mg by mouth daily before breakfast.  . glucose blood test strip 1 each by Other route 2 (two) times daily. DX E11.9  . Lancets (ONETOUCH ULTRASOFT) lancets 1 each by Other route 2 (two) times daily. Dx E11.9  . levETIRAcetam (KEPPRA) 500 MG tablet Take 1 tablet (500 mg total) by mouth 2 (two) times daily.  . metFORMIN (GLUCOPHAGE) 500 MG tablet Take 2 tablets (1,000 mg total) by mouth  2 (two) times daily with a meal.  . Multiple Vitamins-Minerals (MULTIVITAMIN PO) Take 1 tablet by mouth daily.   [name] Kitchen telmisartan (MICARDIS) 80 MG tablet Take one half daily  . valACYclovir (VALTREX) 500 MG tablet Take 1 tablet (500 mg total) by mouth daily.   No facility-administered encounter medications on file as of 11/23/2019.     Objective:  BP Readings from Last 3 Encounters:  11/04/19 (!) 88/62  10/18/19 111/73  10/14/19 (!) 98/58    Goals Addressed            This Visit's Progress   . RNCM: "I am feeling better since getting out of the hospital" (pt-stated)       [place] (see longtitudinal plan of care for additional care plan information)  Current Barriers:  . Chronic Disease Management support, education, and care coordination needs related to HTN, HLD, DMII, and Pulmonary Disease  Clinical Goal(s) related to HTN, HLD, DMII, and Pulmonary Disease:  Over the next 120 days, patient will:  . Work with the care management team to address educational, disease management, and care coordination needs  . Begin or continue self health monitoring activities as directed today Measure and record cbg (blood glucose) 2 times daily, Measure and record blood pressure 5 times per week, and adhere to a Heart healthy/ADA diet . Call provider office for new or worsened signs and symptoms Blood glucose findings outside established parameters, Blood pressure findings outside established parameters, Oxygen saturation lower than established parameter, Shortness  of breath, and New or worsened symptom related to recent hospitalization due to pneumonia and the need to come home on supplemental oxygen . Call care management team with questions or concerns . Verbalize basic understanding of patient centered plan of care established today  Interventions related to HTN, HLD, DMII, and Pulmonary Disease:  . Evaluation of current treatment plans and patient's adherence to plan as established by  provider. Patient saw pcp 09/19/2019 post discharge from the hospital. The patient states he is doing much better and is not even having to use the oxygen as much. His oxygen sats are staying 95 to 98%.  Is using the oxygen 2 liters via  at night. The patient also verbalized that his blood sugars have come down. States they have been in the 120's to 130's.  Patient verbalized his blood pressure is "great". The patient verbalized today that he is no longer using oxygen at all and is turning it back in. The patient was at the gym working out. He is doing 5 miles a day at the gym.  His blood sugars are 105-110 and his blood pressure is systolic 546-270 and diastolic 35-00.  Praised for keeping good track of his numbers. Praised for exercise goal being met. The patient verbalized he feels great.  . Assessed patient understanding of disease states: Patient states he understands his conditions well and is compliant with plan of care and medication regimen  . Assessed patient's education and care coordination needs- working with CCM pharmacist. Denies any needs at this time . Provided disease specific education to patient: Education and support given on Heart healthy/ADA diet. The patient states he does not eat salt.  [name] with appropriate clinical care team members regarding patient needs  Patient Self Care Activities related to HTN, HLD, DMII, and Pulmonary Disease:  . Patient is unable to independently self-manage chronic health conditions  Please see past updates related to this goal by clicking on the "Past Updates" button in the selected goal      . RNCM: Pt-"I have prostate cancer" (pt-stated)       CARE PLAN ENTRY (see longitudinal plan of care for additional care plan information)  Current Barriers:  [name] Kitchen Knowledge Deficits related to pathophysiology of prostate cancer . Care Coordination needs related to treatment and support in a patient with prostate cancer (disease states) . Chronic  Disease Management support and education needs related to prostate cancer  Nurse Case Manager Clinical Goal(s):  [name] Kitchen Over the next 120 days, patient will verbalize understanding of plan for treatment of prostate cancer . Over the next 120 days, patient will work with [facility], [place] team, and pcp to address needs related to prostate cancer  . Over the next 120 days, patient will attend all scheduled medical appointments: the patient has several upcoming appointments for treatment of prostate cancer. The patient is happy that he will be able to take his treatments in [facility] . Over the next 120 days, patient will verbalize basic understanding of prostate disease process and self health management plan as evidenced by remaining independent during treatments and notifying team of changes in condition.   Interventions:  . Inter-disciplinary care team collaboration (see longitudinal plan of care) . Evaluation of current treatment plan related to prostate cancer treatment  and patient's adherence to plan as established by provider. . Advised patient to call the RNCM or pcp for any needs or changes in condition . Provided education to patient re: taking medications as prescribed, following  recommendations of cancer specialist, and keeping appointments  . Discussed plans with patient for ongoing care management follow up and provided patient with direct contact information for care management team . Reviewed scheduled/upcoming provider appointments including: The patient has several upcoming appointments. The patient will have seed placement on 11/24/2019 and will start 40 treatments of radiation on 11/28/2019.  He will go for radiation 5 days a week for a total of 40 treatments.  . Review of support system and available referrals to help the patient. The patient verbalized he is doing well now but if he has any needs he will let the RNCM know.   Patient Self Care Activities:  . Patient verbalizes  understanding of plan to effectively treat prostate cancer . Self administers medications as prescribed . Attends all scheduled provider appointments . Performs ADL's independently . Performs IADL's independently . Calls provider office for new concerns or questions . Unable to independently manage prostate cancer   Initial goal documentation         Plan:   The care management team will reach out to the patient again over the next 60 to 90 days.    Noreene Larsson RN, MSN, [facility] Mobile: [phone]

## 2019-11-23 NOTE — Telephone Encounter (Signed)
Pt  Wife left vm pt is having a procedure tomorrow and needs to know if pt has to stop eating and take his medication please advice

## 2019-11-24 ENCOUNTER — Other Ambulatory Visit: Payer: Self-pay

## 2019-11-24 ENCOUNTER — Encounter: Payer: Self-pay | Admitting: Urology

## 2019-11-24 ENCOUNTER — Ambulatory Visit: Payer: PPO | Admitting: Urology

## 2019-11-24 VITALS — BP 118/74 | HR 64 | Ht 70.0 in | Wt 196.0 lb

## 2019-11-24 DIAGNOSIS — C61 Malignant neoplasm of prostate: Secondary | ICD-10-CM

## 2019-11-24 MED ORDER — LEVOFLOXACIN 500 MG PO TABS
500.0000 mg | ORAL_TABLET | Freq: Once | ORAL | Status: AC
  Administered 2019-11-24: 500 mg via ORAL

## 2019-11-24 MED ORDER — GENTAMICIN SULFATE 40 MG/ML IJ SOLN
80.0000 mg | Freq: Once | INTRAMUSCULAR | Status: AC
  Administered 2019-11-24: 80 mg via INTRAMUSCULAR

## 2019-11-24 NOTE — Telephone Encounter (Signed)
Left pt message to call °

## 2019-11-28 ENCOUNTER — Ambulatory Visit
Admission: RE | Admit: 2019-11-28 | Discharge: 2019-11-28 | Disposition: A | Discharge status: Home / Self Care | Payer: PPO | Source: Ambulatory Visit | Attending: Radiation Oncology | Admitting: Radiation Oncology

## 2019-11-28 DIAGNOSIS — C61 Malignant neoplasm of prostate: Secondary | ICD-10-CM | POA: Diagnosis not present

## 2019-11-28 DIAGNOSIS — Z510 Encounter for antineoplastic radiation therapy: Secondary | ICD-10-CM | POA: Diagnosis not present

## 2019-11-29 ENCOUNTER — Ambulatory Visit: Payer: PPO | Admitting: Adult Health

## 2019-11-29 ENCOUNTER — Encounter: Payer: Self-pay | Admitting: Adult Health

## 2019-11-29 ENCOUNTER — Other Ambulatory Visit: Payer: Self-pay

## 2019-11-29 VITALS — BP 110/70 | HR 64 | Ht 70.0 in | Wt 199.8 lb

## 2019-11-29 DIAGNOSIS — R569 Unspecified convulsions: Secondary | ICD-10-CM

## 2019-11-29 DIAGNOSIS — Z8673 Personal history of transient ischemic attack (TIA), and cerebral infarction without residual deficits: Secondary | ICD-10-CM

## 2019-11-29 DIAGNOSIS — I10 Essential (primary) hypertension: Secondary | ICD-10-CM | POA: Diagnosis not present

## 2019-11-29 DIAGNOSIS — E782 Mixed hyperlipidemia: Secondary | ICD-10-CM | POA: Diagnosis not present

## 2019-11-29 DIAGNOSIS — E119 Type 2 diabetes mellitus without complications: Secondary | ICD-10-CM | POA: Diagnosis not present

## 2019-11-29 MED ORDER — LEVETIRACETAM 500 MG PO TABS: 500.0000 mg | ORAL_TABLET | Freq: Two times a day (BID) | ORAL | 3 refills | Status: DC

## 2019-11-29 NOTE — Progress Notes (Signed)
I agree with the above plan 

## 2019-11-29 NOTE — Patient Instructions (Signed)
Continue aspirin 81 mg daily  and atorvastatin for secondary stroke prevention  Recommend continuation of Keppra 500 mg twice daily at this time for seizure prevention  Continue to follow up with PCP regarding cholesterol, blood pressure and diabetes management   Continue to monitor blood pressure at home  Maintain strict control of hypertension with blood pressure goal below 130/90, diabetes with hemoglobin A1c goal below 6.5% and cholesterol with LDL cholesterol (bad cholesterol) goal below 70 mg/dL. I also advised the patient to eat a healthy diet with plenty of whole grains, cereals, fruits and vegetables, exercise regularly and maintain ideal body weight.  Followup in the future with me in 6 months or call earlier if needed       Thank you for coming to see [place] at [facility]. I hope we have been able to provide you high quality care today.  You may receive a patient satisfaction survey over the next few weeks. We would appreciate your feedback and comments so that we may continue to improve ourselves and the health of our patients.
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
## 2019-11-30 ENCOUNTER — Ambulatory Visit (INDEPENDENT_AMBULATORY_CARE_PROVIDER_SITE_OTHER): Payer: PPO | Admitting: *Deleted

## 2019-11-30 DIAGNOSIS — I639 Cerebral infarction, unspecified: Secondary | ICD-10-CM | POA: Diagnosis not present

## 2019-11-30 LAB — CUP PACEART REMOTE DEVICE CHECK
Date Time Interrogation Session: 20210526015815
Implantable Pulse Generator Implant Date: 20200316

## 2019-11-30 NOTE — Progress Notes (Signed)
Carelink Summary Report / Loop Recorder 

## 2019-12-01 DIAGNOSIS — C61 Malignant neoplasm of prostate: Secondary | ICD-10-CM | POA: Diagnosis not present

## 2019-12-01 DIAGNOSIS — Z510 Encounter for antineoplastic radiation therapy: Secondary | ICD-10-CM | POA: Diagnosis not present

## 2019-12-02 ENCOUNTER — Other Ambulatory Visit: Payer: Self-pay | Admitting: *Deleted

## 2019-12-02 DIAGNOSIS — C61 Malignant neoplasm of prostate: Secondary | ICD-10-CM

## 2019-12-07 ENCOUNTER — Ambulatory Visit: Admission: RE | Admit: 2019-12-07 | Payer: PPO | Source: Ambulatory Visit

## 2019-12-08 ENCOUNTER — Ambulatory Visit
Admission: RE | Admit: 2019-12-08 | Discharge: 2019-12-08 | Disposition: A | Discharge status: Home / Self Care | Payer: PPO | Source: Ambulatory Visit | Attending: Radiation Oncology | Admitting: Radiation Oncology

## 2019-12-08 DIAGNOSIS — C61 Malignant neoplasm of prostate: Secondary | ICD-10-CM | POA: Diagnosis not present

## 2019-12-08 DIAGNOSIS — Z510 Encounter for antineoplastic radiation therapy: Secondary | ICD-10-CM | POA: Diagnosis not present

## 2019-12-09 ENCOUNTER — Ambulatory Visit
Admission: RE | Admit: 2019-12-09 | Discharge: 2019-12-09 | Disposition: A | Discharge status: Home / Self Care | Payer: PPO | Source: Ambulatory Visit | Attending: Radiation Oncology | Admitting: Radiation Oncology

## 2019-12-09 DIAGNOSIS — Z510 Encounter for antineoplastic radiation therapy: Secondary | ICD-10-CM | POA: Diagnosis not present

## 2019-12-09 DIAGNOSIS — C61 Malignant neoplasm of prostate: Secondary | ICD-10-CM | POA: Diagnosis not present

## 2019-12-12 ENCOUNTER — Ambulatory Visit
Admission: RE | Admit: 2019-12-12 | Discharge: 2019-12-12 | Disposition: A | Discharge status: Home / Self Care | Payer: PPO | Source: Ambulatory Visit | Attending: Radiation Oncology | Admitting: Radiation Oncology

## 2019-12-12 DIAGNOSIS — Z510 Encounter for antineoplastic radiation therapy: Secondary | ICD-10-CM | POA: Diagnosis not present

## 2019-12-12 DIAGNOSIS — C61 Malignant neoplasm of prostate: Secondary | ICD-10-CM | POA: Diagnosis not present

## 2019-12-13 ENCOUNTER — Ambulatory Visit
Admission: RE | Admit: 2019-12-13 | Discharge: 2019-12-13 | Disposition: A | Discharge status: Home / Self Care | Payer: PPO | Source: Ambulatory Visit | Attending: Radiation Oncology | Admitting: Radiation Oncology

## 2019-12-13 DIAGNOSIS — C61 Malignant neoplasm of prostate: Secondary | ICD-10-CM | POA: Diagnosis not present

## 2019-12-13 DIAGNOSIS — Z510 Encounter for antineoplastic radiation therapy: Secondary | ICD-10-CM | POA: Diagnosis not present

## 2019-12-14 ENCOUNTER — Ambulatory Visit
Admission: RE | Admit: 2019-12-14 | Discharge: 2019-12-14 | Disposition: A | Discharge status: Home / Self Care | Payer: PPO | Source: Ambulatory Visit | Attending: Radiation Oncology | Admitting: Radiation Oncology

## 2019-12-14 DIAGNOSIS — C61 Malignant neoplasm of prostate: Secondary | ICD-10-CM | POA: Diagnosis not present

## 2019-12-14 DIAGNOSIS — Z510 Encounter for antineoplastic radiation therapy: Secondary | ICD-10-CM | POA: Diagnosis not present

## 2019-12-15 ENCOUNTER — Other Ambulatory Visit: Payer: PPO

## 2019-12-15 ENCOUNTER — Other Ambulatory Visit: Payer: Self-pay

## 2019-12-15 ENCOUNTER — Ambulatory Visit
Admission: RE | Admit: 2019-12-15 | Discharge: 2019-12-15 | Disposition: A | Discharge status: Home / Self Care | Payer: PPO | Source: Ambulatory Visit | Attending: Radiation Oncology | Admitting: Radiation Oncology

## 2019-12-15 DIAGNOSIS — E1159 Type 2 diabetes mellitus with other circulatory complications: Secondary | ICD-10-CM

## 2019-12-15 DIAGNOSIS — E1165 Type 2 diabetes mellitus with hyperglycemia: Secondary | ICD-10-CM | POA: Diagnosis not present

## 2019-12-15 DIAGNOSIS — C61 Malignant neoplasm of prostate: Secondary | ICD-10-CM | POA: Diagnosis not present

## 2019-12-15 DIAGNOSIS — E785 Hyperlipidemia, unspecified: Secondary | ICD-10-CM | POA: Diagnosis not present

## 2019-12-15 DIAGNOSIS — E1169 Type 2 diabetes mellitus with other specified complication: Secondary | ICD-10-CM | POA: Diagnosis not present

## 2019-12-15 DIAGNOSIS — E559 Vitamin D deficiency, unspecified: Secondary | ICD-10-CM

## 2019-12-15 DIAGNOSIS — I10 Essential (primary) hypertension: Secondary | ICD-10-CM | POA: Diagnosis not present

## 2019-12-15 DIAGNOSIS — Z510 Encounter for antineoplastic radiation therapy: Secondary | ICD-10-CM | POA: Diagnosis not present

## 2019-12-15 LAB — MICROALBUMIN, URINE WAIVED
Creatinine, Urine Waived: 100 mg/dL (ref 10–300)
Microalb, Ur Waived: 10 mg/L (ref 0–19)
Microalb/Creat Ratio: <30 mg/g (ref <30)

## 2019-12-16 ENCOUNTER — Ambulatory Visit
Admission: RE | Admit: 2019-12-16 | Discharge: 2019-12-16 | Disposition: A | Discharge status: Home / Self Care | Payer: PPO | Source: Ambulatory Visit | Attending: Radiation Oncology | Admitting: Radiation Oncology

## 2019-12-16 DIAGNOSIS — Z510 Encounter for antineoplastic radiation therapy: Secondary | ICD-10-CM | POA: Diagnosis not present

## 2019-12-16 DIAGNOSIS — C61 Malignant neoplasm of prostate: Secondary | ICD-10-CM | POA: Diagnosis not present

## 2019-12-16 LAB — COMPREHENSIVE METABOLIC PANEL
ALT: 23 IU/L (ref 0–44)
AST: 25 IU/L (ref 0–40)
Albumin/Globulin Ratio: 1.8 (ref 1.2–2.2)
Albumin: 4.4 g/dL (ref 3.8–4.8)
Alkaline Phosphatase: 79 IU/L (ref 48–121)
BUN/Creatinine Ratio: 21 (ref 10–24)
BUN: 26 mg/dL (ref 8–27)
Bilirubin Total: 0.5 mg/dL (ref 0.0–1.2)
CO2: 21 mmol/L (ref 20–29)
Calcium: 9.3 mg/dL (ref 8.6–10.2)
Chloride: 104 mmol/L (ref 96–106)
Creatinine, Ser: 1.22 mg/dL (ref 0.76–1.27)
GFR calc Af Amer: 70 mL/min/{1.73_m2} (ref >59)
GFR calc non Af Amer: 61 mL/min/{1.73_m2} (ref >59)
Globulin, Total: 2.4 g/dL (ref 1.5–4.5)
Glucose: 99 mg/dL (ref 65–99)
Potassium: 4.7 mmol/L (ref 3.5–5.2)
Sodium: 140 mmol/L (ref 134–144)
Total Protein: 6.8 g/dL (ref 6.0–8.5)

## 2019-12-16 LAB — CBC WITH DIFFERENTIAL/PLATELET
Basophils Absolute: 0 10*3/uL (ref 0.0–0.2)
Basos: 1 %
EOS (ABSOLUTE): 0.3 10*3/uL (ref 0.0–0.4)
Eos: 5 %
Hematocrit: 44.8 % (ref 37.5–51.0)
Hemoglobin: 14.2 g/dL (ref 13.0–17.7)
Immature Grans (Abs): 0 10*3/uL (ref 0.0–0.1)
Immature Granulocytes: 0 %
Lymphocytes Absolute: 1.1 10*3/uL (ref 0.7–3.1)
Lymphs: 19 %
MCH: 30.2 pg (ref 26.6–33.0)
MCHC: 31.7 g/dL (ref 31.5–35.7)
MCV: 95 fL (ref 79–97)
Monocytes Absolute: 0.5 10*3/uL (ref 0.1–0.9)
Monocytes: 9 %
Neutrophils Absolute: 3.7 10*3/uL (ref 1.4–7.0)
Neutrophils: 66 %
Platelets: 160 10*3/uL (ref 150–450)
RBC: 4.7 x10E6/uL (ref 4.14–5.80)
RDW: 12.4 % (ref 11.6–15.4)
WBC: 5.7 10*3/uL (ref 3.4–10.8)

## 2019-12-16 LAB — LIPID PANEL W/O CHOL/HDL RATIO
Cholesterol, Total: 125 mg/dL (ref 100–199)
HDL: 55 mg/dL (ref >39)
LDL Chol Calc (NIH): 48 mg/dL (ref 0–99)
Triglycerides: 126 mg/dL (ref 0–149)
VLDL Cholesterol Cal: 22 mg/dL (ref 5–40)

## 2019-12-16 LAB — VITAMIN D 25 HYDROXY (VIT D DEFICIENCY, FRACTURES): Vit D, 25-Hydroxy: 34.1 ng/mL (ref 30.0–100.0)

## 2019-12-16 NOTE — Progress Notes (Signed)
Contacted via MyChart  Good morning Andrew Cobb, you labs have returned and overall everything looks within normal range.  You are doing a great job!!  Keep up the good work!!

## 2019-12-19 ENCOUNTER — Ambulatory Visit (INDEPENDENT_AMBULATORY_CARE_PROVIDER_SITE_OTHER): Payer: PPO | Admitting: Nurse Practitioner

## 2019-12-19 ENCOUNTER — Encounter: Payer: Self-pay | Admitting: Nurse Practitioner

## 2019-12-19 ENCOUNTER — Ambulatory Visit
Admission: RE | Admit: 2019-12-19 | Discharge: 2019-12-19 | Disposition: A | Discharge status: Home / Self Care | Payer: PPO | Source: Ambulatory Visit | Attending: Radiation Oncology | Admitting: Radiation Oncology

## 2019-12-19 ENCOUNTER — Other Ambulatory Visit: Payer: Self-pay

## 2019-12-19 VITALS — BP 123/69 | HR 57 | Temp 97.5°F

## 2019-12-19 DIAGNOSIS — E785 Hyperlipidemia, unspecified: Secondary | ICD-10-CM | POA: Diagnosis not present

## 2019-12-19 DIAGNOSIS — I10 Essential (primary) hypertension: Secondary | ICD-10-CM | POA: Diagnosis not present

## 2019-12-19 DIAGNOSIS — E559 Vitamin D deficiency, unspecified: Secondary | ICD-10-CM

## 2019-12-19 DIAGNOSIS — Z8673 Personal history of transient ischemic attack (TIA), and cerebral infarction without residual deficits: Secondary | ICD-10-CM | POA: Diagnosis not present

## 2019-12-19 DIAGNOSIS — R569 Unspecified convulsions: Secondary | ICD-10-CM

## 2019-12-19 DIAGNOSIS — I152 Hypertension secondary to endocrine disorders: Secondary | ICD-10-CM

## 2019-12-19 DIAGNOSIS — E1159 Type 2 diabetes mellitus with other circulatory complications: Secondary | ICD-10-CM | POA: Diagnosis not present

## 2019-12-19 DIAGNOSIS — E1169 Type 2 diabetes mellitus with other specified complication: Secondary | ICD-10-CM

## 2019-12-19 DIAGNOSIS — E1165 Type 2 diabetes mellitus with hyperglycemia: Secondary | ICD-10-CM

## 2019-12-19 DIAGNOSIS — C61 Malignant neoplasm of prostate: Secondary | ICD-10-CM | POA: Diagnosis not present

## 2019-12-19 DIAGNOSIS — Z510 Encounter for antineoplastic radiation therapy: Secondary | ICD-10-CM | POA: Diagnosis not present

## 2019-12-19 LAB — BAYER DCA HB A1C WAIVED: HB A1C (BAYER DCA - WAIVED): 6.3 % (ref <7.0)

## 2019-12-19 NOTE — Assessment & Plan Note (Signed)
Chronic, ongoing.  Continue ASA and maintaining tight lipid control, may need to increase Atorvastatin to 80 MG if ever elevation, recent LDL in 40's.  Continue current Atorvastatin dosing.

## 2019-12-19 NOTE — Progress Notes (Signed)
BP 123/69   Pulse (!) 57   Temp (!) 97.5 F (36.4 C) (Oral)   SpO2 93%    Subjective:    Patient ID: Andrew Cobb, male    DOB: 1951/06/30, 69 y.o.   MRN: [id number]  HPI: Andrew Cobb is a 69 y.o. male  Chief Complaint  Patient presents with  . Diabetes  . Hyperlipidemia  . Hypertension   DIABETES Last A1C inMarch while in hospital was 9.2%, which is up from previous 7%. He continues on Metformin 1000 MG BID and Jardiance 10 MG.  Has history of bariatric surgery 8 years ago. Hypoglycemic episodes:no Polydipsia/polyuria:no Visual disturbance:no Chest pain:no Paresthesias:no Glucose Monitoring:yes Accucheck frequency: Daily Fasting glucose:115 to 120 range Post prandial: Evening: Before meals: Taking Insulin?:no Long acting insulin: Short acting insulin: Blood Pressure Monitoring:daily Retinal Examination:Up to Date Foot Exam:Up to Date Pneumovax:refused Influenza:Up to Date Aspirin:yes  HYPERTENSION / HYPERLIPIDEMIA Continues Telmisartan 80 MG and Atorvastatin 40 MG daily. Followed by neurology due to recent stroke events x 3. Currently has loop recorder in place. 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  Satisfied with current treatment?yes Duration of hypertension:chronic BP monitoring frequency:daily BP range:110 -115/70-80 BP medication side effects:no Duration of hyperlipidemia:chronic Cholesterol medication side effects:no Cholesterol supplements: none Medication compliance:good compliance Aspirin:yes Recent stressors:no Recurrent headaches:no Visual changes:no Palpitations:no Dyspnea:yes Chest pain:no Lower extremity edema:no Dizzy/lightheaded:no The ASCVD Risk score [name], et al., 2013) failed to calculate for the following reasons:   The patient has a prior MI or stroke diagnosis  HISTORY OF PROSTATE  CA: Previously followed by Dr. Bernardo Heater. Last seen 11/24/2019 and is currently going through IMRT for 40 days, reports he is tolerating this well.  SEIZURES: Seen by neurology on 11/29/2019 and recommendation to continue Keppra 500 MG BID + ASA and Atorvastatin.  No recent seizure activity. Reports he is walking 10 miles a day and going to [facility], states the only thing that frustrates him is he can not drive for at least 6 months.  Continues to have loop recorder in place.  Relevant past medical, surgical, family and social history reviewed and updated as indicated. Interim medical history since our last visit reviewed. Allergies and medications reviewed and updated.  Review of Systems  Constitutional: Negative for activity change, diaphoresis, fatigue and fever.  Respiratory: Negative for cough, chest tightness, shortness of breath and wheezing.   Cardiovascular: Negative for chest pain, palpitations and leg swelling.  Gastrointestinal: Negative.   Endocrine: Negative for polydipsia, polyphagia and polyuria.  Neurological: Negative.   Psychiatric/Behavioral: Negative.     Per HPI unless specifically indicated above     Objective:    BP 123/69   Pulse (!) 57   Temp (!) 97.5 F (36.4 C) (Oral)   SpO2 93%   Wt Readings from Last 3 Encounters:  11/29/19 199 lb 12.8 oz (90.6 kg)  11/24/19 196 lb (88.9 kg)  11/04/19 196 lb 6.4 oz (89.1 kg)    Physical Exam Vitals and nursing note reviewed.  Constitutional:      General: He is awake. He is not in acute distress.    Appearance: He is well-developed and well-groomed. He is not ill-appearing.  HENT:     Head: Normocephalic and atraumatic.     Right Ear: Hearing normal. No drainage.     Left Ear: Hearing normal. No drainage.     Nose:     Comments: Mask in place. Eyes:     General: Lids are normal.  Right eye: No discharge.        Left eye: No discharge.     Conjunctiva/sclera: Conjunctivae normal.     Pupils: Pupils are  equal, round, and reactive to light.  Neck:     Thyroid: No thyromegaly.     Vascular: No carotid bruit or JVD.     Trachea: Trachea normal.  Cardiovascular:     Rate and Rhythm: Normal rate and regular rhythm.     Heart sounds: Normal heart sounds, S1 normal and S2 normal. No murmur heard.  No gallop.   Pulmonary:     Effort: Pulmonary effort is normal. No accessory muscle usage or respiratory distress.     Breath sounds: Normal breath sounds.  Abdominal:     General: Bowel sounds are normal.     Palpations: Abdomen is soft. There is no hepatomegaly or splenomegaly.  Musculoskeletal:        General: Normal range of motion.     Cervical back: Normal range of motion and neck supple.     Right lower leg: No edema.     Left lower leg: No edema.  Skin:    General: Skin is warm and dry.     Capillary Refill: Capillary refill takes less than 2 seconds.  Neurological:     Mental Status: He is alert and oriented to person, place, and time.     Deep Tendon Reflexes: Reflexes are normal and symmetric.  Psychiatric:        Attention and Perception: Attention normal.        Mood and Affect: Mood normal.        Speech: Speech normal.        Behavior: Behavior normal. Behavior is cooperative.        Thought Content: Thought content normal.    Results for orders placed or performed in visit on 12/15/19  VITAMIN D 25 Hydroxy (Vit-D Deficiency, Fractures)  Result Value Ref Range   Vit D, 25-Hydroxy 34.1 30.0 - 100.0 ng/mL  Microalbumin, Urine Waived  Result Value Ref Range   Microalb, Ur Waived 10 0 - 19 mg/L   Creatinine, Urine Waived 100 10 - 300 mg/dL   Microalb/Creat Ratio <30 <30 mg/g  Lipid Panel w/o Chol/HDL Ratio  Result Value Ref Range   Cholesterol, Total 125 100 - 199 mg/dL   Triglycerides 126 0 - 149 mg/dL   HDL 55 >39 mg/dL   VLDL Cholesterol Cal 22 5 - 40 mg/dL   LDL Chol Calc (NIH) 48 0 - 99 mg/dL  Comprehensive metabolic panel  Result Value Ref Range   Glucose 99 65  - 99 mg/dL   BUN 26 8 - 27 mg/dL   Creatinine, Ser 1.22 0.76 - 1.27 mg/dL   GFR calc non Af Amer 61 >59 mL/min/1.73   GFR calc Af Amer 70 >59 mL/min/1.73   BUN/Creatinine Ratio 21 10 - 24   Sodium 140 134 - 144 mmol/L   Potassium 4.7 3.5 - 5.2 mmol/L   Chloride 104 96 - 106 mmol/L   CO2 21 20 - 29 mmol/L   Calcium 9.3 8.6 - 10.2 mg/dL   Total Protein 6.8 6.0 - 8.5 g/dL   Albumin 4.4 3.8 - 4.8 g/dL   Globulin, Total 2.4 1.5 - 4.5 g/dL   Albumin/Globulin Ratio 1.8 1.2 - 2.2   Bilirubin Total 0.5 0.0 - 1.2 mg/dL   Alkaline Phosphatase 79 48 - 121 IU/L   AST 25 0 - 40 IU/L  ALT 23 0 - 44 IU/L  CBC with Differential/Platelet  Result Value Ref Range   WBC 5.7 3.4 - 10.8 x10E3/uL   RBC 4.70 4.14 - 5.80 x10E6/uL   Hemoglobin 14.2 13.0 - 17.7 g/dL   Hematocrit 44.8 37.5 - 51.0 %   MCV 95 79 - 97 fL   MCH 30.2 26.6 - 33.0 pg   MCHC 31.7 31 - 35 g/dL   RDW 12.4 11.6 - 15.4 %   Platelets 160 150 - 450 x10E3/uL   Neutrophils 66 Not Estab. %   Lymphs 19 Not Estab. %   Monocytes 9 Not Estab. %   Eos 5 Not Estab. %   Basos 1 Not Estab. %   Neutrophils Absolute 3.7 1 - 7 x10E3/uL   Lymphocytes Absolute 1.1 0 - 3 x10E3/uL   Monocytes Absolute 0.5 0 - 0 x10E3/uL   EOS (ABSOLUTE) 0.3 0.0 - 0.4 x10E3/uL   Basophils Absolute 0.0 0 - 0 x10E3/uL   Immature Granulocytes 0 Not Estab. %   Immature Grans (Abs) 0.0 0.0 - 0.1 x10E3/uL      Assessment & Plan:   Problem List Items Addressed This Visit      Cardiovascular and Mediastinum   Hypertension associated with diabetes ([place])    Chronic, ongoing with BP below neuro goal <130/90.  Continue current medication regimen and adjust as needed.  Recommend he monitor BP at home daily and document.  Continue focus on DASH diet and regular exercise at home.  Return in 3 months.        Endocrine   Uncontrolled type 2 diabetes mellitus with hyperglycemia, without long-term current use of insulin (HCC) - Primary    Chronic, ongoing with A1C today  6.3% (at goal) and improved from previous 9.2%,  plus urine ALB 10 and A:C <30.  Sugars are trending down at home.  At neuro goal of <6.5%.  Continue  Metformin 1000 MG BID and continue Jardiance 10 MG, will increase Jardiance if A1C elevation noted in future.  Recommend he check BS twice daily.  Return in 3 months for A1C check and continue to focus on regular exercise and diet at home.      Relevant Orders   Bayer DCA Hb A1c Waived   Hyperlipidemia associated with type 2 diabetes mellitus (HCC)    Chronic, ongoing.  Continue ASA and maintaining tight lipid control, may need to increase Atorvastatin to 80 MG if ever elevation, recent LDL in 40's.  Continue current Atorvastatin dosing.        Genitourinary   Prostate cancer [facility])    Currently undergoing IMRT, continue collaboration with oncology and urology.  Recent notes reviewed.        Other   History of stroke    Continue collaboration with neurology due to significant stroke history.  Continue current medication regimen.      Vitamin D deficiency    Chronic, improved on recent labs.  Continue daily supplement and adjust as needed.      Seizures ([place])    Being followed by neurology.  Continue current Keppra dose as prescribed by them and collaboration with neurology, reviewed notes.          Follow up plan: Return in about 3 months (around 03/20/2020) for T2DM, HTN/HLD, Prostate CA, Seizures.

## 2019-12-19 NOTE — Assessment & Plan Note (Addendum)
Chronic, ongoing with A1C today 6.3% (at goal) and improved from previous 9.2%,  plus urine ALB 10 and A:C <30.  Sugars are trending down at home.  At neuro goal of <6.5%.  Continue  Metformin 1000 MG BID and continue Jardiance 10 MG, will increase Jardiance if A1C elevation noted in future.  Recommend he check BS twice daily.  Return in 3 months for A1C check and continue to focus on regular exercise and diet at home.

## 2019-12-19 NOTE — Assessment & Plan Note (Signed)
Being followed by neurology.  Continue current Keppra dose as prescribed by them and collaboration with neurology, reviewed notes.   

## 2019-12-19 NOTE — Patient Instructions (Signed)

## 2019-12-19 NOTE — Assessment & Plan Note (Addendum)
Chronic, ongoing with BP below neuro goal <130/90.  Continue current medication regimen and adjust as needed.  Recommend he monitor BP at home daily and document.  Continue focus on DASH diet and regular exercise at home.  Return in 3 months.

## 2019-12-19 NOTE — Assessment & Plan Note (Addendum)
Chronic, improved on recent labs.  Continue daily supplement and adjust as needed.

## 2019-12-19 NOTE — Assessment & Plan Note (Signed)
Continue collaboration with neurology due to significant stroke history.  Continue current medication regimen. 

## 2019-12-19 NOTE — Assessment & Plan Note (Signed)
Currently undergoing IMRT, continue collaboration with oncology and urology.  Recent notes reviewed. 

## 2019-12-20 ENCOUNTER — Ambulatory Visit
Admission: RE | Admit: 2019-12-20 | Discharge: 2019-12-20 | Disposition: A | Discharge status: Home / Self Care | Payer: PPO | Source: Ambulatory Visit | Attending: Radiation Oncology | Admitting: Radiation Oncology

## 2019-12-20 DIAGNOSIS — C61 Malignant neoplasm of prostate: Secondary | ICD-10-CM | POA: Diagnosis not present

## 2019-12-20 DIAGNOSIS — Z510 Encounter for antineoplastic radiation therapy: Secondary | ICD-10-CM | POA: Diagnosis not present

## 2019-12-21 ENCOUNTER — Ambulatory Visit
Admission: RE | Admit: 2019-12-21 | Discharge: 2019-12-21 | Disposition: A | Discharge status: Home / Self Care | Payer: PPO | Source: Ambulatory Visit | Attending: Radiation Oncology | Admitting: Radiation Oncology

## 2019-12-21 DIAGNOSIS — C61 Malignant neoplasm of prostate: Secondary | ICD-10-CM | POA: Diagnosis not present

## 2019-12-21 DIAGNOSIS — J189 Pneumonia, unspecified organism: Secondary | ICD-10-CM | POA: Diagnosis not present

## 2019-12-21 DIAGNOSIS — Z510 Encounter for antineoplastic radiation therapy: Secondary | ICD-10-CM | POA: Diagnosis not present

## 2019-12-21 DIAGNOSIS — I2510 Atherosclerotic heart disease of native coronary artery without angina pectoris: Secondary | ICD-10-CM | POA: Diagnosis not present

## 2019-12-22 ENCOUNTER — Ambulatory Visit
Admission: RE | Admit: 2019-12-22 | Discharge: 2019-12-22 | Disposition: A | Discharge status: Home / Self Care | Payer: PPO | Source: Ambulatory Visit | Attending: Radiation Oncology | Admitting: Radiation Oncology

## 2019-12-22 ENCOUNTER — Inpatient Hospital Stay: Payer: PPO | Attending: Radiation Oncology

## 2019-12-22 DIAGNOSIS — Z510 Encounter for antineoplastic radiation therapy: Secondary | ICD-10-CM | POA: Diagnosis not present

## 2019-12-22 DIAGNOSIS — C61 Malignant neoplasm of prostate: Secondary | ICD-10-CM | POA: Diagnosis not present

## 2019-12-23 ENCOUNTER — Ambulatory Visit
Admission: RE | Admit: 2019-12-23 | Discharge: 2019-12-23 | Disposition: A | Discharge status: Home / Self Care | Payer: PPO | Source: Ambulatory Visit | Attending: Radiation Oncology | Admitting: Radiation Oncology

## 2019-12-23 DIAGNOSIS — Z510 Encounter for antineoplastic radiation therapy: Secondary | ICD-10-CM | POA: Diagnosis not present

## 2019-12-26 ENCOUNTER — Ambulatory Visit
Admission: RE | Admit: 2019-12-26 | Discharge: 2019-12-26 | Disposition: A | Discharge status: Home / Self Care | Payer: PPO | Source: Ambulatory Visit | Attending: Radiation Oncology | Admitting: Radiation Oncology

## 2019-12-26 DIAGNOSIS — C61 Malignant neoplasm of prostate: Secondary | ICD-10-CM | POA: Diagnosis not present

## 2019-12-26 DIAGNOSIS — Z510 Encounter for antineoplastic radiation therapy: Secondary | ICD-10-CM | POA: Diagnosis not present

## 2019-12-27 ENCOUNTER — Ambulatory Visit
Admission: RE | Admit: 2019-12-27 | Discharge: 2019-12-27 | Disposition: A | Discharge status: Home / Self Care | Payer: PPO | Source: Ambulatory Visit | Attending: Radiation Oncology | Admitting: Radiation Oncology

## 2019-12-27 DIAGNOSIS — C61 Malignant neoplasm of prostate: Secondary | ICD-10-CM | POA: Diagnosis not present

## 2019-12-27 DIAGNOSIS — Z510 Encounter for antineoplastic radiation therapy: Secondary | ICD-10-CM | POA: Diagnosis not present

## 2019-12-28 ENCOUNTER — Ambulatory Visit
Admission: RE | Admit: 2019-12-28 | Discharge: 2019-12-28 | Disposition: A | Discharge status: Home / Self Care | Payer: PPO | Source: Ambulatory Visit | Attending: Radiation Oncology | Admitting: Radiation Oncology

## 2019-12-28 DIAGNOSIS — Z510 Encounter for antineoplastic radiation therapy: Secondary | ICD-10-CM | POA: Diagnosis not present

## 2019-12-28 DIAGNOSIS — C61 Malignant neoplasm of prostate: Secondary | ICD-10-CM | POA: Diagnosis not present

## 2019-12-29 ENCOUNTER — Ambulatory Visit
Admission: RE | Admit: 2019-12-29 | Discharge: 2019-12-29 | Disposition: A | Discharge status: Home / Self Care | Payer: PPO | Source: Ambulatory Visit | Attending: Radiation Oncology | Admitting: Radiation Oncology

## 2019-12-29 DIAGNOSIS — C61 Malignant neoplasm of prostate: Secondary | ICD-10-CM | POA: Diagnosis not present

## 2019-12-29 DIAGNOSIS — Z510 Encounter for antineoplastic radiation therapy: Secondary | ICD-10-CM | POA: Diagnosis not present

## 2019-12-30 ENCOUNTER — Ambulatory Visit
Admission: RE | Admit: 2019-12-30 | Discharge: 2019-12-30 | Disposition: A | Discharge status: Home / Self Care | Payer: PPO | Source: Ambulatory Visit | Attending: Radiation Oncology | Admitting: Radiation Oncology

## 2019-12-30 DIAGNOSIS — C61 Malignant neoplasm of prostate: Secondary | ICD-10-CM | POA: Diagnosis not present

## 2019-12-30 DIAGNOSIS — Z510 Encounter for antineoplastic radiation therapy: Secondary | ICD-10-CM | POA: Diagnosis not present

## 2020-01-02 ENCOUNTER — Ambulatory Visit (INDEPENDENT_AMBULATORY_CARE_PROVIDER_SITE_OTHER): Payer: PPO | Admitting: *Deleted

## 2020-01-02 ENCOUNTER — Ambulatory Visit
Admission: RE | Admit: 2020-01-02 | Discharge: 2020-01-02 | Disposition: A | Discharge status: Home / Self Care | Payer: PPO | Source: Ambulatory Visit | Attending: Radiation Oncology | Admitting: Radiation Oncology

## 2020-01-02 DIAGNOSIS — Z510 Encounter for antineoplastic radiation therapy: Secondary | ICD-10-CM | POA: Diagnosis not present

## 2020-01-02 DIAGNOSIS — C61 Malignant neoplasm of prostate: Secondary | ICD-10-CM | POA: Diagnosis not present

## 2020-01-02 DIAGNOSIS — R55 Syncope and collapse: Secondary | ICD-10-CM | POA: Diagnosis not present

## 2020-01-02 LAB — CUP PACEART REMOTE DEVICE CHECK
Date Time Interrogation Session: 20210628030601
Implantable Pulse Generator Implant Date: 20200316

## 2020-01-03 ENCOUNTER — Ambulatory Visit
Admission: RE | Admit: 2020-01-03 | Discharge: 2020-01-03 | Disposition: A | Discharge status: Home / Self Care | Payer: PPO | Source: Ambulatory Visit | Attending: Radiation Oncology | Admitting: Radiation Oncology

## 2020-01-03 DIAGNOSIS — C61 Malignant neoplasm of prostate: Secondary | ICD-10-CM | POA: Diagnosis not present

## 2020-01-03 DIAGNOSIS — Z510 Encounter for antineoplastic radiation therapy: Secondary | ICD-10-CM | POA: Diagnosis not present

## 2020-01-03 NOTE — Progress Notes (Signed)
Carelink Summary Report / Loop Recorder 

## 2020-01-04 ENCOUNTER — Ambulatory Visit
Admission: RE | Admit: 2020-01-04 | Discharge: 2020-01-04 | Disposition: A | Discharge status: Home / Self Care | Payer: PPO | Source: Ambulatory Visit | Attending: Radiation Oncology | Admitting: Radiation Oncology

## 2020-01-04 DIAGNOSIS — Z510 Encounter for antineoplastic radiation therapy: Secondary | ICD-10-CM | POA: Diagnosis not present

## 2020-01-04 DIAGNOSIS — C61 Malignant neoplasm of prostate: Secondary | ICD-10-CM | POA: Diagnosis not present

## 2020-01-05 ENCOUNTER — Inpatient Hospital Stay: Payer: PPO | Attending: Radiation Oncology

## 2020-01-05 ENCOUNTER — Ambulatory Visit
Admission: RE | Admit: 2020-01-05 | Discharge: 2020-01-05 | Disposition: A | Discharge status: Home / Self Care | Payer: PPO | Source: Ambulatory Visit | Attending: Radiation Oncology | Admitting: Radiation Oncology

## 2020-01-05 ENCOUNTER — Other Ambulatory Visit: Payer: Self-pay

## 2020-01-05 DIAGNOSIS — Z510 Encounter for antineoplastic radiation therapy: Secondary | ICD-10-CM | POA: Diagnosis not present

## 2020-01-05 DIAGNOSIS — C61 Malignant neoplasm of prostate: Secondary | ICD-10-CM | POA: Diagnosis not present

## 2020-01-05 LAB — CBC
HCT: 43 % (ref 39.0–52.0)
Hemoglobin: 14.1 g/dL (ref 13.0–17.0)
MCH: 31.1 pg (ref 26.0–34.0)
MCHC: 32.8 g/dL (ref 30.0–36.0)
MCV: 94.9 fL (ref 80.0–100.0)
Platelets: 142 K/uL — ABNORMAL LOW (ref 150–400)
RBC: 4.53 MIL/uL (ref 4.22–5.81)
RDW: 13.2 % (ref 11.5–15.5)
WBC: 5.6 K/uL (ref 4.0–10.5)
nRBC: 0 % (ref 0.0–0.2)

## 2020-01-06 ENCOUNTER — Ambulatory Visit
Admission: RE | Admit: 2020-01-06 | Discharge: 2020-01-06 | Disposition: A | Discharge status: Home / Self Care | Payer: PPO | Source: Ambulatory Visit | Attending: Radiation Oncology | Admitting: Radiation Oncology

## 2020-01-06 DIAGNOSIS — Z510 Encounter for antineoplastic radiation therapy: Secondary | ICD-10-CM | POA: Diagnosis not present

## 2020-01-06 DIAGNOSIS — C61 Malignant neoplasm of prostate: Secondary | ICD-10-CM | POA: Diagnosis not present

## 2020-01-10 ENCOUNTER — Ambulatory Visit
Admission: RE | Admit: 2020-01-10 | Discharge: 2020-01-10 | Disposition: A | Discharge status: Home / Self Care | Payer: PPO | Source: Ambulatory Visit | Attending: Radiation Oncology | Admitting: Radiation Oncology

## 2020-01-10 DIAGNOSIS — Z510 Encounter for antineoplastic radiation therapy: Secondary | ICD-10-CM | POA: Diagnosis not present

## 2020-01-10 DIAGNOSIS — C61 Malignant neoplasm of prostate: Secondary | ICD-10-CM | POA: Diagnosis not present

## 2020-01-11 ENCOUNTER — Ambulatory Visit
Admission: RE | Admit: 2020-01-11 | Discharge: 2020-01-11 | Disposition: A | Discharge status: Home / Self Care | Payer: PPO | Source: Ambulatory Visit | Attending: Radiation Oncology | Admitting: Radiation Oncology

## 2020-01-11 DIAGNOSIS — Z510 Encounter for antineoplastic radiation therapy: Secondary | ICD-10-CM | POA: Diagnosis not present

## 2020-01-11 DIAGNOSIS — C61 Malignant neoplasm of prostate: Secondary | ICD-10-CM | POA: Diagnosis not present

## 2020-01-12 ENCOUNTER — Ambulatory Visit
Admission: RE | Admit: 2020-01-12 | Discharge: 2020-01-12 | Disposition: A | Discharge status: Home / Self Care | Payer: PPO | Source: Ambulatory Visit | Attending: Radiation Oncology | Admitting: Radiation Oncology

## 2020-01-12 DIAGNOSIS — C61 Malignant neoplasm of prostate: Secondary | ICD-10-CM | POA: Diagnosis not present

## 2020-01-12 DIAGNOSIS — Z510 Encounter for antineoplastic radiation therapy: Secondary | ICD-10-CM | POA: Diagnosis not present

## 2020-01-13 ENCOUNTER — Ambulatory Visit
Admission: RE | Admit: 2020-01-13 | Discharge: 2020-01-13 | Disposition: A | Discharge status: Home / Self Care | Payer: PPO | Source: Ambulatory Visit | Attending: Radiation Oncology | Admitting: Radiation Oncology

## 2020-01-13 ENCOUNTER — Other Ambulatory Visit: Payer: Self-pay | Admitting: Family Medicine

## 2020-01-13 DIAGNOSIS — Z510 Encounter for antineoplastic radiation therapy: Secondary | ICD-10-CM | POA: Diagnosis not present

## 2020-01-13 DIAGNOSIS — C61 Malignant neoplasm of prostate: Secondary | ICD-10-CM | POA: Diagnosis not present

## 2020-01-13 DIAGNOSIS — E119 Type 2 diabetes mellitus without complications: Secondary | ICD-10-CM

## 2020-01-13 NOTE — Telephone Encounter (Signed)
Requested Prescriptions  Pending Prescriptions Disp Refills   ONETOUCH ULTRA test strip [Pharmacy Med Name: [place] In Vitro Strip] 100 each 11    Sig: USE TWICE DAILY     Endocrinology: Diabetes - Testing Supplies Passed - 01/13/2020 11:26 AM      Passed - Valid encounter within last 12 months    Recent Outpatient Visits          3 weeks ago Uncontrolled type 2 diabetes mellitus with hyperglycemia, without long-term current use of insulin ([place])   [place], Jolene T, NP   2 months ago Uncontrolled type 2 diabetes mellitus with hyperglycemia, without long-term current use of insulin ([place])   [place], [place] T, NP   3 months ago Uncontrolled type 2 diabetes mellitus with hyperglycemia, without long-term current use of insulin ([place])   [place], Hansell T, NP   6 months ago Annual physical exam   [place], Jolene T, NP   11 months ago Nontraumatic cortical hemorrhage of left cerebral hemisphere [facility])   [place], Rachel Elizabeth, PA-C      Future Appointments            In 6 days Wert, Christena Deem, MD [facility]   In 2 months [name], [name], NP [employer]

## 2020-01-16 ENCOUNTER — Ambulatory Visit
Admission: RE | Admit: 2020-01-16 | Discharge: 2020-01-16 | Disposition: A | Discharge status: Home / Self Care | Payer: PPO | Source: Ambulatory Visit | Attending: Radiation Oncology | Admitting: Radiation Oncology

## 2020-01-16 DIAGNOSIS — C61 Malignant neoplasm of prostate: Secondary | ICD-10-CM | POA: Diagnosis not present

## 2020-01-16 DIAGNOSIS — Z510 Encounter for antineoplastic radiation therapy: Secondary | ICD-10-CM | POA: Diagnosis not present

## 2020-01-17 ENCOUNTER — Other Ambulatory Visit: Admission: RE | Admit: 2020-01-17 | Payer: PPO | Source: Ambulatory Visit

## 2020-01-17 ENCOUNTER — Ambulatory Visit
Admission: RE | Admit: 2020-01-17 | Discharge: 2020-01-17 | Disposition: A | Discharge status: Home / Self Care | Payer: PPO | Source: Ambulatory Visit | Attending: Radiation Oncology | Admitting: Radiation Oncology

## 2020-01-17 ENCOUNTER — Other Ambulatory Visit: Payer: Self-pay | Admitting: Licensed Clinical Social Worker

## 2020-01-17 DIAGNOSIS — C61 Malignant neoplasm of prostate: Secondary | ICD-10-CM | POA: Diagnosis not present

## 2020-01-17 DIAGNOSIS — Z510 Encounter for antineoplastic radiation therapy: Secondary | ICD-10-CM | POA: Diagnosis not present

## 2020-01-17 MED ORDER — TAMSULOSIN HCL 0.4 MG PO CAPS: 0.4000 mg | ORAL_CAPSULE | Freq: Every day | ORAL | 1 refills | Status: DC

## 2020-01-18 ENCOUNTER — Ambulatory Visit
Admission: RE | Admit: 2020-01-18 | Discharge: 2020-01-18 | Disposition: A | Discharge status: Home / Self Care | Payer: PPO | Source: Ambulatory Visit | Attending: Radiation Oncology | Admitting: Radiation Oncology

## 2020-01-18 DIAGNOSIS — Z510 Encounter for antineoplastic radiation therapy: Secondary | ICD-10-CM | POA: Diagnosis not present

## 2020-01-18 DIAGNOSIS — C61 Malignant neoplasm of prostate: Secondary | ICD-10-CM | POA: Diagnosis not present

## 2020-01-19 ENCOUNTER — Other Ambulatory Visit: Payer: Self-pay

## 2020-01-19 ENCOUNTER — Ambulatory Visit: Payer: PPO | Admitting: Internal Medicine

## 2020-01-19 ENCOUNTER — Ambulatory Visit
Admission: RE | Admit: 2020-01-19 | Discharge: 2020-01-19 | Disposition: A | Discharge status: Home / Self Care | Payer: PPO | Source: Ambulatory Visit | Attending: Radiation Oncology | Admitting: Radiation Oncology

## 2020-01-19 ENCOUNTER — Inpatient Hospital Stay: Payer: PPO

## 2020-01-19 ENCOUNTER — Ambulatory Visit: Payer: PPO

## 2020-01-19 DIAGNOSIS — Z510 Encounter for antineoplastic radiation therapy: Secondary | ICD-10-CM | POA: Diagnosis not present

## 2020-01-19 DIAGNOSIS — C61 Malignant neoplasm of prostate: Secondary | ICD-10-CM | POA: Diagnosis not present

## 2020-01-19 NOTE — Progress Notes (Deleted)
Andrew Cobb, male    DOB: 27-Jul-1950,    MRN: [id number]   Brief patient profile:  59 yowm quit smoking 1981 with no resp problems at all but around 2018-19 while on lisinopril started needing inhalers for colds then symbicort started 2020 then "sinus infection" and dx covid Pos 08/26/19 with resolution of symptoms s rx  then admitted 09/07/19:    Admit date: 09/07/2019 Discharge date: 09/16/2019  Admitted From: Home Disposition: Home with home health  Recommendations for Outpatient Follow-up:  1. Follow up with PCP in 1-2 weeks 2. Please obtain BMP/CBC in one week your next doctors visit.  3. Keppra 500 mg twice daily 4. Follow-up patient neurology in 4 weeks 5. Recommended against driving, instructions have been given to him.  He is not allowed to drive at least until seen by outpatient neurology and cleared by them. 6. Supplemental oxygen for home has been arranged for 7. 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 MRI showed acute CVA along with chronic changes, neurology team was consulted. There was also concerns of seizure therefore started on Keppra. CT of the chest without contrast showed groundglass opacity concerns for pneumonitis/pneumonia.  Despite of several days of bronchodilator treatment, he still remained hypoxic without any evidence of fluid overload or infection.  Suspect is this aspiration pneumonitis causing his hypoxia therefore home oxygen has been arranged for.  Remains medically stable at this time.  His care has been extensively discussed by me with his wife.  Acute encephalopathy, persist Acute seizures, Acute CVA 70mm left parietal white matter per MRI but neurology  thinks is chronic.  History of recurrent embolic infarcts/CVA with hemorrhagic conversion in July 2020 -Status post loading dose of Keppra, Keppra 500 mg twice daily -EEG-negative for seizures but shows encephalopathy. Continue seizure precaution -Per neurology team patient has chronic CVA.  Follow-up outpatient with neurology.  Recommends against driving, he has been instructed about this.  Wife knows about this as well.  Patient and wife "understands this. -Aspirin 81 mg, atorvastatin 40 mg daily -CT head-negative -UA-shows glucosuria but no evidence of infection  Bilateral multifocal infiltrate Aspiration pneumonitis/pneumonia in the setting of seizure -Ambulatory pulse ox still dropping down to 83% requiring 4 L nasal cannula.  Home oxygen arranged for.  No shortness of breath with ambulation at all.  Doing well with incentive spirometer. -COVID-19-negative  -Procalcitonin-negative, BNP Normal. Discontinue Zosyn after today -Respiratory panel-negative -Echocardiogram March 2020-EF 60 to 65%, mild LVH -I-S//flutter. Scheduled and as needed bronchodilators -CT chest consistent with bilateral infiltrates-aspiration? -aspiration precautions. -Seen by speech team status post MBS-recommending regular diet  Acute kidney injury, prerenal -Appears to have resolved. Creatinine at baseline around 1.1  Coronary artery disease status post PCI -Currently chest pain-free. Continue aspirin and statin  Essential hypertension -Benzapril on hold due to AKI  D M2, poorly controlled due to hyperglycemia -Home p.o. meds on hold, A1c 9.2 -Resume his home regimen  Hyperlipidemia -Statin  OSA -CPAP  PT-Home health-PT/OT/RN.     Discharge Diagnoses:  Principal Problem:   Altered mental status   OSA (obstructive sleep apnea)   Hypertension associated with diabetes ([place])   Uncontrolled type 2 diabetes mellitus with hyperglycemia, without long-term current use of insulin  (  [place])   Coronary artery disease   Hyperlipidemia associated with type 2 diabetes mellitus ([place])   History of CVA (cerebrovascular accident)   Acute encephalopathy   Bilateral pneumonia   Subjective: Patient feels great, he wants to go home today.  Denies any shortness of breath.  Does have still some hypoxia with ambulation very requiring 4 L nasal cannula.  Overall he feels great.    History of Present Illness  10/14/2019  Pulmonary/ 1st office eval/Evelena Masci  Chief Complaint  Patient presents with  . Consult  Dyspnea:  Walking around outside x 20 minutes picking up sticks s 02  Cough: variable severe dry assoc hoarseness  Sleep: 2lpm hs on side / bed is flat/ one pillow SABA use: neb at least once daily rec  Goal for 02 is to keep saturation above 90%  Plan A = Automatic = Always=    Symbicort 160 Take 1-2 puffs first thing in am and then another 2 puffs about 12 hours later.  Work on inhaler technique:   Plan B = Backup (to supplement plan A, not to replace it) Only use your albuterol inhaler as a rescue medication  Plan C = Crisis (instead of Plan B but only if Plan B stops working) - only use your albuterol nebulizer if you first try Plan B and it fails to help > ok to use the nebulizer up to every 4 hours but if start needing it regularly call for immediate appointment Stop benazpril and start telmasartan 80 mg daily - take one half if too low    11/04/2019  f/u ov/Emon Miggins re: uacs/ off all 02 and inhalers  Chief Complaint  Patient presents with  . Follow-up   Dyspnea:  Not limited by breathing from desired activities   Cough: gone Sleeping: fine flat SABA use: none  No longer using 02 rec Reduce telmasartan to 80 mg one half daily  Please schedule a follow up visit in 2  months but call sooner if needed with cxr and pfts     01/19/2020  f/u ov/Mallie Linnemann re:  No chief complaint on file.    Dyspnea:  *** Cough: *** Sleeping: *** SABA use: *** 02: ***   No obvious day  to day or daytime variability or assoc excess/ purulent sputum or mucus plugs or hemoptysis or cp or chest tightness, subjective wheeze or overt sinus or hb symptoms.   *** without nocturnal  or early am exacerbation  of respiratory  c/o's or need for noct saba. Also denies any obvious fluctuation of symptoms with weather or environmental changes or other aggravating or alleviating factors except as outlined above   No unusual exposure hx or h/o childhood pna/ asthma or knowledge of premature birth.  Current Allergies, Complete Past Medical History, Past Surgical History, Family History, and Social History were reviewed in [employer] record.  ROS  The following are not active complaints unless bolded Hoarseness, sore throat, dysphagia, dental problems, itching, sneezing,  nasal congestion or discharge of excess mucus or purulent secretions, ear ache,   fever, chills, sweats, unintended wt loss or wt gain, classically pleuritic or exertional cp,  orthopnea pnd or arm/hand swelling  or leg swelling, presyncope, palpitations, abdominal pain, anorexia, nausea, vomiting, diarrhea  or change in bowel habits or change in bladder habits, change in stools or change in urine, dysuria, hematuria,  rash, arthralgias, visual complaints, headache, numbness, weakness or ataxia or problems with walking or coordination,  change in mood or  memory.        No outpatient medications have been marked as taking for the 01/19/20 encounter (Appointment) with Tanda Rockers, MD.            .              Past Medical History:  Diagnosis Date  . Arthritis   . Asthma   . Cancer [facility])    prostate  . Chronic venous insufficiency    varicose vein lower extremity with inflammation  . Coronary artery disease 1996   two stents placed   . Diabetes mellitus without complication ([place])    type 2 on metformin  . GERD (gastroesophageal reflux disease)    no issues since gastric bypass surgery  as stated per pt  . Hyperlipidemia   . Hypogonadism in male   . MRSA (methicillin resistant Staphylococcus aureus) infection    07/30/2008 thru 08/07/2008  . Sleep apnea    on BIPAP  . Stented coronary artery   . Stroke ([place])   . 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

## 2020-01-20 ENCOUNTER — Ambulatory Visit
Admission: RE | Admit: 2020-01-20 | Discharge: 2020-01-20 | Disposition: A | Discharge status: Home / Self Care | Payer: PPO | Source: Ambulatory Visit | Attending: Radiation Oncology | Admitting: Radiation Oncology

## 2020-01-20 DIAGNOSIS — Z510 Encounter for antineoplastic radiation therapy: Secondary | ICD-10-CM | POA: Diagnosis not present

## 2020-01-20 DIAGNOSIS — J189 Pneumonia, unspecified organism: Secondary | ICD-10-CM | POA: Diagnosis not present

## 2020-01-20 DIAGNOSIS — I2510 Atherosclerotic heart disease of native coronary artery without angina pectoris: Secondary | ICD-10-CM | POA: Diagnosis not present

## 2020-01-20 DIAGNOSIS — C61 Malignant neoplasm of prostate: Secondary | ICD-10-CM | POA: Diagnosis not present

## 2020-01-23 ENCOUNTER — Ambulatory Visit
Admission: RE | Admit: 2020-01-23 | Discharge: 2020-01-23 | Disposition: A | Discharge status: Home / Self Care | Payer: PPO | Source: Ambulatory Visit | Attending: Radiation Oncology | Admitting: Radiation Oncology

## 2020-01-23 DIAGNOSIS — C61 Malignant neoplasm of prostate: Secondary | ICD-10-CM | POA: Diagnosis not present

## 2020-01-23 DIAGNOSIS — Z510 Encounter for antineoplastic radiation therapy: Secondary | ICD-10-CM | POA: Diagnosis not present

## 2020-01-24 ENCOUNTER — Ambulatory Visit
Admission: RE | Admit: 2020-01-24 | Discharge: 2020-01-24 | Disposition: A | Discharge status: Home / Self Care | Payer: PPO | Source: Ambulatory Visit | Attending: Radiation Oncology | Admitting: Radiation Oncology

## 2020-01-24 DIAGNOSIS — C61 Malignant neoplasm of prostate: Secondary | ICD-10-CM | POA: Diagnosis not present

## 2020-01-24 DIAGNOSIS — Z510 Encounter for antineoplastic radiation therapy: Secondary | ICD-10-CM | POA: Diagnosis not present

## 2020-01-25 ENCOUNTER — Ambulatory Visit
Admission: RE | Admit: 2020-01-25 | Discharge: 2020-01-25 | Disposition: A | Discharge status: Home / Self Care | Payer: PPO | Source: Ambulatory Visit | Attending: Radiation Oncology | Admitting: Radiation Oncology

## 2020-01-25 DIAGNOSIS — C61 Malignant neoplasm of prostate: Secondary | ICD-10-CM | POA: Diagnosis not present

## 2020-01-25 DIAGNOSIS — Z510 Encounter for antineoplastic radiation therapy: Secondary | ICD-10-CM | POA: Diagnosis not present

## 2020-01-26 ENCOUNTER — Ambulatory Visit
Admission: RE | Admit: 2020-01-26 | Discharge: 2020-01-26 | Disposition: A | Discharge status: Home / Self Care | Payer: PPO | Source: Ambulatory Visit | Attending: Radiation Oncology | Admitting: Radiation Oncology

## 2020-01-26 DIAGNOSIS — C61 Malignant neoplasm of prostate: Secondary | ICD-10-CM | POA: Diagnosis not present

## 2020-01-26 DIAGNOSIS — Z510 Encounter for antineoplastic radiation therapy: Secondary | ICD-10-CM | POA: Diagnosis not present

## 2020-01-27 ENCOUNTER — Ambulatory Visit
Admission: RE | Admit: 2020-01-27 | Discharge: 2020-01-27 | Disposition: A | Discharge status: Home / Self Care | Payer: PPO | Source: Ambulatory Visit | Attending: Radiation Oncology | Admitting: Radiation Oncology

## 2020-01-27 DIAGNOSIS — C61 Malignant neoplasm of prostate: Secondary | ICD-10-CM | POA: Diagnosis not present

## 2020-01-27 DIAGNOSIS — Z510 Encounter for antineoplastic radiation therapy: Secondary | ICD-10-CM | POA: Diagnosis not present

## 2020-01-30 ENCOUNTER — Ambulatory Visit
Admission: RE | Admit: 2020-01-30 | Discharge: 2020-01-30 | Disposition: A | Discharge status: Home / Self Care | Payer: PPO | Source: Ambulatory Visit | Attending: Radiation Oncology | Admitting: Radiation Oncology

## 2020-01-30 DIAGNOSIS — Z510 Encounter for antineoplastic radiation therapy: Secondary | ICD-10-CM | POA: Diagnosis not present

## 2020-01-30 DIAGNOSIS — C61 Malignant neoplasm of prostate: Secondary | ICD-10-CM | POA: Diagnosis not present

## 2020-01-31 ENCOUNTER — Ambulatory Visit (INDEPENDENT_AMBULATORY_CARE_PROVIDER_SITE_OTHER): Payer: PPO | Admitting: Ophthalmology

## 2020-01-31 ENCOUNTER — Ambulatory Visit
Admission: RE | Admit: 2020-01-31 | Discharge: 2020-01-31 | Disposition: A | Discharge status: Home / Self Care | Payer: PPO | Source: Ambulatory Visit | Attending: Radiation Oncology | Admitting: Radiation Oncology

## 2020-01-31 ENCOUNTER — Other Ambulatory Visit: Payer: Self-pay | Admitting: Internal Medicine

## 2020-01-31 ENCOUNTER — Other Ambulatory Visit: Payer: Self-pay

## 2020-01-31 ENCOUNTER — Encounter (INDEPENDENT_AMBULATORY_CARE_PROVIDER_SITE_OTHER): Payer: Self-pay | Admitting: Ophthalmology

## 2020-01-31 DIAGNOSIS — H35721 Serous detachment of retinal pigment epithelium, right eye: Secondary | ICD-10-CM

## 2020-01-31 DIAGNOSIS — Z961 Presence of intraocular lens: Secondary | ICD-10-CM | POA: Diagnosis not present

## 2020-01-31 DIAGNOSIS — H353132 Nonexudative age-related macular degeneration, bilateral, intermediate dry stage: Secondary | ICD-10-CM | POA: Diagnosis not present

## 2020-01-31 DIAGNOSIS — Z510 Encounter for antineoplastic radiation therapy: Secondary | ICD-10-CM | POA: Diagnosis not present

## 2020-01-31 DIAGNOSIS — H353211 Exudative age-related macular degeneration, right eye, with active choroidal neovascularization: Secondary | ICD-10-CM | POA: Diagnosis not present

## 2020-01-31 DIAGNOSIS — C61 Malignant neoplasm of prostate: Secondary | ICD-10-CM | POA: Diagnosis not present

## 2020-01-31 MED ORDER — FLUORESCEIN SODIUM 10 % IV SOLN
500.0000 mg | INTRAVENOUS | Status: AC | PRN
  Administered 2020-01-31: 500 mg via INTRAVENOUS

## 2020-01-31 MED ORDER — BEVACIZUMAB CHEMO INJECTION 1.25MG/0.05ML SYRINGE FOR KALEIDOSCOPE
1.2500 mg | INTRAVITREAL | Status: AC | PRN
  Administered 2020-01-31: 1.25 mg via INTRAVITREAL

## 2020-01-31 NOTE — Progress Notes (Signed)
01/31/2020     CHIEF COMPLAINT Patient presents for Retina Follow Up   HISTORY OF PRESENT ILLNESS: Andrew Cobb is a 69 y.o. male who presents to the clinic today for:   HPI    Retina Follow Up    Patient presents with  Dry AMD.  In both eyes.  This started 9 months ago.  Severity is mild.  Duration of 9 months.  Since onset it is stable.          Comments    9 Month AMD F/U OU  Pt reports stable VA OU. 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  A1c: 6.1, 11/2019       Last edited by Rockie Neighbours, [place] on 01/31/2020  8:53 AM. (History)      Referring physician: [name], NP [place],  [place] [address]  HISTORICAL INFORMATION:   Selected notes from the MEDICAL RECORD NUMBER    Lab Results  Component Value Date   HGBA1C 6.3 12/19/2019     CURRENT MEDICATIONS: No current outpatient medications on file. (Ophthalmic Drugs)   No current facility-administered medications for this visit. (Ophthalmic Drugs)   Current Outpatient Medications (Other)  Medication Sig  . amitriptyline (ELAVIL) 25 MG tablet Take 1 tablet (25 mg total) by mouth at bedtime.  . Ascorbic Acid (VITAMIN C) 1000 MG tablet Take 1,000 mg by mouth daily.  [name] Kitchen aspirin EC 81 MG EC tablet Take 1 tablet (81 mg total) by mouth daily.  [name] Kitchen atorvastatin (LIPITOR) 40 MG tablet Take 1 tablet (40 mg total) by mouth at bedtime.  . cetirizine (ZYRTEC) 10 MG tablet Take 10 mg by mouth daily.  . Cholecalciferol (VITAMIN D) 2000 units CAPS Take 2,000 Units by mouth daily.  . empagliflozin (JARDIANCE) 10 MG TABS tablet Take 10 mg by mouth daily before breakfast.  . Lancets (ONETOUCH ULTRASOFT) lancets 1 each by Other route 2 (two) times daily. Dx E11.9  . levETIRAcetam (KEPPRA) 500 MG tablet Take 1 tablet (500 mg total) by mouth 2 (two) times daily.  . metFORMIN (GLUCOPHAGE) 500 MG tablet Take 2 tablets (1,000 mg total) by mouth 2 (two) times daily with a meal.  . Multiple Vitamins-Minerals  (MULTIVITAMIN PO) Take 1 tablet by mouth daily.   [name] ULTRA test strip USE TWICE DAILY  . tamsulosin (FLOMAX) 0.4 MG CAPS capsule Take 1 capsule (0.4 mg total) by mouth daily after supper.  . telmisartan (MICARDIS) 80 MG tablet Take one half daily  . valACYclovir (VALTREX) 500 MG tablet Take 1 tablet (500 mg total) by mouth daily.   No current facility-administered medications for this visit. (Other)      REVIEW OF SYSTEMS:    ALLERGIES Allergies  Allergen Reactions  . Succinylsulphathiazole Rash  . Sulfamethoxazole-Trimethoprim Rash  . Tetracyclines & Related Rash    PAST MEDICAL HISTORY Past Medical History:  Diagnosis Date  . Arthritis   . Asthma   . Cancer [facility])    prostate  . Chronic venous insufficiency    varicose vein lower extremity with inflammation  . Coronary artery disease 1996   two stents placed   . Diabetes mellitus without complication ([place])    type 2 on metformin  . GERD (gastroesophageal reflux disease)    no issues since gastric bypass surgery as stated per pt  . Hyperlipidemia   . Hypogonadism in male   . MRSA (methicillin resistant Staphylococcus aureus) infection    07/30/2008  thru 08/07/2008  . Sleep apnea    on BIPAP  . Stented coronary artery   . Stroke ([place])   . Thrombocythemia [facility])    Past Surgical History:  Procedure Laterality Date  . ANGIOPLASTY    . ANGIOPLASTY     with stent 04/07/1995  . ANTERIOR CERVICAL DECOMPRESSION/DISCECTOMY FUSION 4 LEVELS N/A 08/17/2017   Procedure: Anterior discectomy with fusion and plate fixation Cervical Three-Four, Four-Five, Five-Six, and Six-Seven Fusion;  Surgeon: Ditty, Kevan Ny, MD;  Location: [place];  Service: Neurosurgery;  Laterality: N/A;  Anterior discectomy with fusion and plate fixation Cervical Three-Four, Four-Five, Five-Six, and Six-Seven Fusion   . APPENDECTOMY     1966  . BUBBLE STUDY  09/20/2018   Procedure: BUBBLE STUDY;  Surgeon: Josue Hector, MD;  Location: [place];  Service: Cardiovascular;;  . [place]  . CARDIOVASCULAR STRESS TEST     07/31/2011  . CARPAL TUNNEL RELEASE Left   . CHOLECYSTECTOMY     2006  . colonscopy      08/25/2012  . EYE SURGERY Bilateral    cataract  . FUNCTIONAL ENDOSCOPIC SINUS SURGERY     11/10/2013  . GASTRIC BYPASS     10/05/2012  . HERNIA REPAIR     left inguinal 1981  . INCISION AND DRAINAGE ABSCESS Right 02/26/2017   Procedure: INCISION AND DRAINAGE ABSCESS;  Surgeon: Nickie Retort, MD;  Location: [facility];  Service: Urology;  Laterality: Right;  . JOINT REPLACEMENT     bilateral  . left ankle surgery      05/03/2003   . left carpel tunnel      09/18/1993  . left knee meniscal tear      01/25/2010  . left knee meniscal tear repair      05/04/1996  . left rotator cuff repair      05/03/2003   . LOOP RECORDER INSERTION N/A 09/20/2018   Procedure: LOOP RECORDER INSERTION;  Surgeon: Evans Lance, MD;  Location: [facility];  Service: Cardiovascular;  Laterality: N/A;  . REPLACEMENT TOTAL KNEE BILATERAL  07/13/2015  . right ankle surgery      fracture has 2 screws 07/07/1997  . right carpel tunnel      05/16/1992  . right shoulder replacement      01/27/2006  . SCROTAL EXPLORATION Right 02/26/2017   Procedure: SCROTUM EXPLORATION;  Surgeon: Nickie Retort, MD;  Location: [facility];  Service: Urology;  Laterality: Right;  . TEE WITHOUT CARDIOVERSION N/A 09/20/2018   Procedure: TRANSESOPHAGEAL ECHOCARDIOGRAM (TEE);  Surgeon: Josue Hector, MD;  Location: [facility];  Service: Cardiovascular;  Laterality: N/A;  . TOTAL KNEE ARTHROPLASTY Left 07/13/2015   Procedure: LEFT TOTAL KNEE ARTHROPLASTY;  Surgeon: Gaynelle Arabian, MD;  Location: [facility];  Service: Orthopedics;  Laterality: Left;    FAMILY HISTORY Family History  Problem Relation Age of Onset  . Cancer Mother        pancreatic  . Diabetes Mother   . Stroke Mother   . Heart disease Mother   . Hyperlipidemia  Mother   . Hypertension Mother   . Heart attack Mother   . Heart disease Father   . Stroke Father   . Diabetes Father   . Hypertension Father   . Heart attack Father   . Hyperlipidemia Father   . Pancreatic cancer Father   . Cancer Sister   . Cancer Brother        lung  .  Cancer Brother   . Kidney cancer Neg Hx   . Bladder Cancer Neg Hx   . Prostate cancer Neg Hx     SOCIAL HISTORY Social History   Tobacco Use  . Smoking status: Former Smoker    Packs/day: 1.00    Years: 10.00    Pack years: 10.00    Types: Cigarettes    Quit date: 07/07/1984    Years since quitting: 35.5  . Smokeless tobacco: Never Used  . Tobacco comment: quit 1986  Vaping Use  . Vaping Use: Never used  Substance Use Topics  . Alcohol use: No    Alcohol/week: 0.0 standard drinks  . Drug use: No         OPHTHALMIC EXAM:  Base Eye Exam    Visual Acuity (ETDRS)      Right Left   Dist [place] 20/30 +2 20/20 -1   Dist ph [place] NI        Tonometry (Tonopen, 8:57 AM)      Right Left   Pressure 13 12       Pupils      Pupils Dark Light Shape React APD   Right PERRL 4 3 Round Brisk None   Left PERRL 4 3 Round Brisk None       Visual Fields (Counting fingers)      Left Right    Full Full       Extraocular Movement      Right Left    Full Full       Neuro/Psych    Oriented x3: Yes   Mood/Affect: Normal       Dilation    Both eyes: 1.0% Mydriacyl, 2.5% Phenylephrine @ 8:57 AM        Slit Lamp and Fundus Exam    External Exam      Right Left   External Normal Normal       Slit Lamp Exam      Right Left   Lids/Lashes Normal Normal   Conjunctiva/Sclera White and quiet White and quiet   Cornea Clear Clear   Anterior Chamber Deep and quiet Deep and quiet   Iris Round and reactive Round and reactive   Lens Posterior chamber intraocular lens Posterior chamber intraocular lens   Anterior Vitreous Normal Normal       Fundus Exam      Right Left   Posterior Vitreous Posterior  vitreous detachment Posterior vitreous detachment   Disc Normal Normal   C/D Ratio 0.5 0.5   Macula Hard drusen, no exudates, Macular thickening, Soft drusen, Pigmented atrophy Hard drusen, Early age related macular degeneration, no macular thickening, no exudates   Vessels Normal    Periphery Normal           IMAGING AND PROCEDURES  Imaging and Procedures for 01/31/20  OCT, Retina - OU - Both Eyes       Right Eye Quality was good. Scan locations included subfoveal. Central Foveal Thickness: 351. Progression has worsened. Findings include abnormal foveal contour, retinal drusen , subretinal fluid.   Left Eye Quality was good. Scan locations included subfoveal. Central Foveal Thickness: 289. Findings include no IRF, no SRF, retinal drusen .        Fluorescein Angiography Optos (Transit OD)       Injection:  500 mg Fluorescein Sodium 10 % injection   NDC: [phone], Lot: [id number]   Route: Intravenous, Site: Left ArmRight Eye   Early phase findings include leakage, choroidal neovascularization. Mid/Late  phase findings include window defect, leakage, choroidal neovascularization. Choroidal neovascularization is juxtafoveal.   Left Eye Mid/Late phase findings include window defect. Choroidal neovascularization is not present.   Notes New vascularized pigment epithelial detachment with leakage, CNVM, will treat with Avastin OD today       Color Fundus Photography Optos - OU - Both Eyes       Right Eye Progression has worsened. Disc findings include normal observations. Macula : drusen. Vessels : normal observations. Periphery : normal observations.   Left Eye Progression has been stable. Disc findings include normal observations. Macula : drusen. Vessels : normal observations. Periphery : normal observations.        Intravitreal Injection, Pharmacologic Agent - OD - Right Eye       Time Out 01/31/2020. 10:20 AM. Confirmed correct patient, procedure, site, and  patient consented.   Anesthesia Topical anesthesia was used. Anesthetic medications included Akten 3.5%.   Procedure Preparation included 5% betadine to ocular surface, 10% betadine to eyelids, Tobramycin 0.3%, Ofloxacin . A 30 gauge needle was used.   Injection:  1.25 mg Bevacizumab (AVASTIN) SOLN   NDC: [address]-8, Lot: [id number]   Route: Intravitreal, Site: Right Eye, Waste: 0 mg  Post-op Post injection exam found visual acuity of at least counting fingers. The patient tolerated the procedure well. There were no complications. The patient received written and verbal post procedure care education. Post injection medications were not given.                 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 Exudative age-related macular degeneration of right eye with active choroidal neovascularization (HCC)  H35.3211 Fluorescein Angiography Optos (Transit OD)    Fluorescein Sodium 10 % injection 500 mg    Color Fundus Photography Optos - OU - Both Eyes    Intravitreal Injection, Pharmacologic Agent - OD - Right Eye    Bevacizumab (AVASTIN) SOLN 1.25 mg  2. Intermediate stage nonexudative age-related macular degeneration of both eyes  H35.3132 OCT, Retina - OU - Both Eyes    Color Fundus Photography Optos - OU - Both Eyes  3. Serous detachment of retinal pigment epithelium of right eye  H35.721 OCT, Retina - OU - Both Eyes    Color Fundus Photography Optos - OU - Both Eyes  4. Pseudophakia  Z96.1     1.  2.  3.  Ophthalmic Meds Ordered this visit:  Meds ordered this encounter  Medications  . Fluorescein Sodium 10 % injection 500 mg  . Bevacizumab (AVASTIN) SOLN 1.25 mg       Return in  about 5 weeks (around 03/06/2020) for dilate, OD, AVASTIN OCT.  Patient Instructions  Patient should notify us promptly if new onset distorted vision were to develop    Explained the diagnoses, plan, and follow up with the patient and they expressed understanding.  Patient expressed understanding of the importance of proper follow up care.   Andrew Cobb M.D. Diseases & Surgery of the Retina and Vitreous Retina & Diabetic [place] 01/31/20     Abbreviations: M myopia (nearsighted); A astigmatism; H hyperopia (farsighted); P presbyopia; Mrx spectacle prescription;  CTL contact lenses; OD right eye; OS left eye; OU both eyes  XT exotropia; ET esotropia; PEK punctate  epithelial keratitis; PEE punctate epithelial erosions; DES dry eye syndrome; MGD meibomian gland dysfunction; ATs artificial tears; PFAT's preservative free artificial tears; [place] nuclear sclerotic cataract; PSC posterior subcapsular cataract; ERM epi-retinal membrane; PVD posterior vitreous detachment; RD retinal detachment; DM diabetes mellitus; DR diabetic retinopathy; NPDR non-proliferative diabetic retinopathy; PDR proliferative diabetic retinopathy; CSME clinically significant macular edema; DME diabetic macular edema; dbh dot blot hemorrhages; CWS cotton wool spot; POAG primary open angle glaucoma; C/D cup-to-disc ratio; HVF humphrey visual field; GVF goldmann visual field; OCT optical coherence tomography; IOP intraocular pressure; BRVO Branch retinal vein occlusion; CRVO central retinal vein occlusion; CRAO central retinal artery occlusion; BRAO branch retinal artery occlusion; RT retinal tear; SB scleral buckle; PPV pars plana vitrectomy; VH Vitreous hemorrhage; PRP panretinal laser photocoagulation; IVK intravitreal kenalog; VMT vitreomacular traction; MH Macular hole;  NVD neovascularization of the disc; NVE neovascularization elsewhere; AREDS age related eye disease study; ARMD age related macular degeneration; POAG  primary open angle glaucoma; EBMD epithelial/anterior basement membrane dystrophy; ACIOL anterior chamber intraocular lens; IOL intraocular lens; PCIOL posterior chamber intraocular lens; Phaco/IOL phacoemulsification with intraocular lens placement; [place] photorefractive keratectomy; LASIK laser assisted in situ keratomileusis; HTN hypertension; DM diabetes mellitus; COPD chronic obstructive pulmonary disease

## 2020-01-31 NOTE — Assessment & Plan Note (Signed)
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

## 2020-01-31 NOTE — Patient Instructions (Signed)
Patient should notify us promptly if new onset distorted vision were to develop

## 2020-02-01 ENCOUNTER — Telehealth: Payer: Self-pay

## 2020-02-01 ENCOUNTER — Ambulatory Visit
Admission: RE | Admit: 2020-02-01 | Discharge: 2020-02-01 | Disposition: A | Discharge status: Home / Self Care | Payer: PPO | Source: Ambulatory Visit | Attending: Radiation Oncology | Admitting: Radiation Oncology

## 2020-02-01 ENCOUNTER — Telehealth: Payer: Self-pay | Admitting: Pharmacist

## 2020-02-01 ENCOUNTER — Telehealth: Payer: Self-pay | Admitting: Nurse Practitioner

## 2020-02-01 DIAGNOSIS — C61 Malignant neoplasm of prostate: Secondary | ICD-10-CM | POA: Diagnosis not present

## 2020-02-01 DIAGNOSIS — Z510 Encounter for antineoplastic radiation therapy: Secondary | ICD-10-CM | POA: Diagnosis not present

## 2020-02-01 NOTE — Progress Notes (Signed)
°  Chronic Care Management   Outreach Note  02/01/2020 Name: JAMORION GOMILLION MRN: [id number] DOB: 1951-05-21  Referred by: [name], NP Reason for referral : Chronic Care Management  Mr. Debruler was scheduled for a CCM follow up phone call today. He informed me that he was unable to utilize CCM services because he has been receiving bills for services. This issue was relayed to [facility], Heloise Ochoa and Alita Chyle per Courtney Heys, PharmD.  Follow Up Plan: Will collaborate with care guides to reschedule next month.  [name]. Kenton Kingfisher PharmD, [facility] [phone]

## 2020-02-01 NOTE — Telephone Encounter (Signed)
Copied from [place] [phone]. Topic: General - Other >> Jan 31, 2020  2:16 PM Rainey Pines A wrote: Patient is wanting a callback from practice admin in regards to the pharmacy phone call visits. Patient stated that pharmacist has been filing the phone visits with insurance under Dr. Jeananne Rama and not Dr. Wynetta Emery which is causing them to receive multiple  bills for these appt. If it is filed under [place] will be 0 dollars. Patients wife [name] is requesting a callback

## 2020-02-02 ENCOUNTER — Ambulatory Visit
Admission: RE | Admit: 2020-02-02 | Discharge: 2020-02-02 | Disposition: A | Discharge status: Home / Self Care | Payer: PPO | Source: Ambulatory Visit | Attending: Radiation Oncology | Admitting: Radiation Oncology

## 2020-02-02 ENCOUNTER — Telehealth: Payer: Self-pay | Admitting: Nurse Practitioner

## 2020-02-02 DIAGNOSIS — C61 Malignant neoplasm of prostate: Secondary | ICD-10-CM | POA: Diagnosis not present

## 2020-02-02 DIAGNOSIS — Z510 Encounter for antineoplastic radiation therapy: Secondary | ICD-10-CM | POA: Diagnosis not present

## 2020-02-02 NOTE — Telephone Encounter (Signed)
Pt has been r/s for 03/05/2020 

## 2020-02-02 NOTE — Telephone Encounter (Signed)
Copied from [place] [phone]. Topic: Appointment Scheduling - Scheduling Inquiry for Clinic >> Feb 02, 2020  3:34 PM Erick Blinks wrote: Reason for CRM: Pt returned call for scheduling, states that amber reached out to him but he is unsure which appt needs to be rescheduled. Please advise  [phone]

## 2020-02-06 ENCOUNTER — Ambulatory Visit (INDEPENDENT_AMBULATORY_CARE_PROVIDER_SITE_OTHER): Payer: PPO | Admitting: *Deleted

## 2020-02-06 DIAGNOSIS — I639 Cerebral infarction, unspecified: Secondary | ICD-10-CM | POA: Diagnosis not present

## 2020-02-07 ENCOUNTER — Telehealth: Payer: Self-pay

## 2020-02-07 LAB — CUP PACEART REMOTE DEVICE CHECK
Date Time Interrogation Session: 20210731031005
Implantable Pulse Generator Implant Date: 20200316

## 2020-02-08 NOTE — Progress Notes (Signed)
Carelink Summary Report / Loop Recorder 

## 2020-02-17 ENCOUNTER — Ambulatory Visit (INDEPENDENT_AMBULATORY_CARE_PROVIDER_SITE_OTHER): Payer: PPO

## 2020-02-17 ENCOUNTER — Ambulatory Visit (INDEPENDENT_AMBULATORY_CARE_PROVIDER_SITE_OTHER): Payer: PPO | Admitting: Internal Medicine

## 2020-02-17 ENCOUNTER — Encounter: Payer: Self-pay | Admitting: Primary Care

## 2020-02-17 ENCOUNTER — Ambulatory Visit (INDEPENDENT_AMBULATORY_CARE_PROVIDER_SITE_OTHER): Payer: PPO | Admitting: Primary Care

## 2020-02-17 ENCOUNTER — Other Ambulatory Visit: Payer: Self-pay

## 2020-02-17 DIAGNOSIS — J45909 Unspecified asthma, uncomplicated: Secondary | ICD-10-CM | POA: Diagnosis not present

## 2020-02-17 DIAGNOSIS — J4520 Mild intermittent asthma, uncomplicated: Secondary | ICD-10-CM

## 2020-02-17 LAB — PULMONARY FUNCTION TEST
DL/VA % pred: 109 %
DL/VA: 4.44 ml/min/mmHg/L
DLCO cor % pred: 83 %
DLCO cor: 21.79 ml/min/mmHg
DLCO unc % pred: 82 %
DLCO unc: 21.48 ml/min/mmHg
FEF 25-75 Post: 2.48 L/sec
FEF 25-75 Pre: 1.5 L/sec
FEF2575-%Change-Post: 64 %
FEF2575-%Pred-Post: 98 %
FEF2575-%Pred-Pre: 59 %
FEV1-%Change-Post: 24 %
FEV1-%Pred-Post: 78 %
FEV1-%Pred-Pre: 63 %
FEV1-Post: 2.6 L
FEV1-Pre: 2.08 L
FEV1FVC-%Change-Post: 16 %
FEV1FVC-%Pred-Pre: 86 %
FEV6-%Change-Post: 5 %
FEV6-%Pred-Post: 81 %
FEV6-%Pred-Pre: 77 %
FEV6-Post: 3.45 L
FEV6-Pre: 3.26 L
FEV6FVC-%Pred-Post: 105 %
FEV6FVC-%Pred-Pre: 105 %
FVC-%Change-Post: 7 %
FVC-%Pred-Post: 78 %
FVC-%Pred-Pre: 73 %
FVC-Post: 3.5 L
FVC-Pre: 3.26 L
Post FEV1/FVC ratio: 74 %
Post FEV6/FVC ratio: 100 %
Pre FEV1/FVC ratio: 64 %
Pre FEV6/FVC Ratio: 100 %

## 2020-02-17 NOTE — Progress Notes (Signed)
Full PFT performed today. °

## 2020-02-17 NOTE — Assessment & Plan Note (Addendum)
-   Former smoker quit in 1986. 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 Patient is asymptomatic. Rare SABA use. Denies shortness of breath, wheezing or cough. Do not recommend daily maintenance inhaler unless symptoms worsen.  - 02/17/2020 FEV1 2.60 (78%), ratio 74 +BD / Moderate obstruction with reversibility. Normal diffusion capacity.  - Continue Zyrtec 10mg  daily and PRN Ventolin  - Follow up with pulmonary as needed basis

## 2020-02-17 NOTE — Patient Instructions (Addendum)
Pleasure meeting you today Andrew Cobb   Testing: - Pulmonary function testings showed COPD/asthma overlap. Given your history more likely it is obstructive asthma. - Because you are asymptomatic we will not start you on a maintenance inhaler. If you find your breeathing is more labored or you are using Ventolin rescue inhaler more frequently we will consider putting you on daily inhaler to manage asthma symptoms   Recommendations: - Continue Zyrtec 10mg  daily  - Keep Albuterol rescue inhaler on hand- take 2 puffs every 4-6 hours as needed for breakthrough shortness of breath/wheezing  Orders: - CXR today (already ordered) - Discontinue Oxygen with adapt   Follow-up: - Pulmonary follow-up as needed basis

## 2020-02-17 NOTE — Progress Notes (Signed)
@Patient  ID: Andrew Cobb, male    DOB: 1950-10-21, 69 y.o.   MRN: [id number]  Chief Complaint  Patient presents with  . Follow-up    pt needs order for d/c of oxygen so [employer] can pick up tanks    Referring provider: [name], NP  HPI: 69 year old male, former smoker quit in 1986 (10-pack-year history).  Past medical history significant for asthma, bilateral pneumonia, chronic sinusitis, obstructive sleep apnea, hypertension, coronary artery disease, GERD, bariatric surgery, seizures, stroke, prostate cancer.  Patient of Dr. Melvyn Novas, last seen in office on 11/04/2019.  ACE inhibitor changed to ARB in April 2021 due to hoarseness/pseudoasthma.  Chest x-ray on 10/14/2019 showed marked improvement in pulmonary infiltrates.  Due for final follow-up chest x-ray at next visit. Patient also needs baseline PFTs.  02/17/2020- Interim hx  Patient presents today for a 65-month follow-up office visit with chest x-ray and PFTs. He is doing well, no complaints. He is not on any maintenance inhalers. He had stroke in march, he has been doing water aerobics/therapy. He is able to mow his lawn and work in garden without shortness of breath. Maintained on Zyrtec 10mg  daily and prn ventolin. He has not needed to use his rescue inhaler since March 2021. Patient is 100% compliant with CPAP use, pressure 7cm h20 with residual AHI 1.7. CPAP is not managed by [place]. Denies shortness of breath, chest tightness, wheezing, cough.    Dyspnea: None Cough: None Nocturnal: None; uses CPAP SABA: None  O2: No longer using   Pulmonary function testing: 02/17/2020 - FVC 3.50 (78%), FEV1 2.60 (78%), ratio 74, TLC 82%, DLCOcor 83%, +BD  Moderate obstruction with reversibility. Normal diffusion capacity.   Ambulatory walk: 02/17/2020 - O2 stayed above 96% RA after 3 Laps   Allergies  Allergen Reactions  . Succinylsulphathiazole Rash  . Sulfamethoxazole-Trimethoprim Rash  . Tetracyclines & Related Rash     Immunization History  Administered Date(s) Administered  . Fluad Quad(high Dose 65+) 03/16/2019  . Influenza Whole 04/09/2009  . Influenza, High Dose Seasonal PF 04/06/2017, 04/22/2018  . Influenza,inj,Quad PF,6+ Mos 04/24/2015  . Influenza-Unspecified 03/07/2014, 04/06/2017  . Moderna SARS-COVID-2 Vaccination 12/19/2019, 01/16/2020  . Td 03/12/2005  . Tdap 07/17/2016  . Zoster 07/17/2016    Past Medical History:  Diagnosis Date  . Arthritis   . Asthma   . Cancer [facility])    prostate  . Chronic venous insufficiency    varicose vein lower extremity with inflammation  . Coronary artery disease 1996   two stents placed   . Diabetes mellitus without complication ([place])    type 2 on metformin  . GERD (gastroesophageal reflux disease)    no issues since gastric bypass surgery as stated per pt  . Hyperlipidemia   . Hypogonadism in male   . MRSA (methicillin resistant Staphylococcus aureus) infection    07/30/2008 thru 08/07/2008  . Sleep apnea    on BIPAP  . Stented coronary artery   . Stroke ([place])   . 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 Packs/day: 1.00  . Years: 10.00  . Pack years: 10.00  . Types: Cigarettes  . Quit date: 07/07/1984  . Years since quitting: 35.6  Smokeless Tobacco Never Used  Tobacco Comment   quit 1986   Counseling given: Not Answered Comment: quit 1986   Outpatient Medications Prior to Visit  Medication Sig Dispense Refill  . amitriptyline (ELAVIL) 25  MG tablet Take 1 tablet (25 mg total) by mouth at bedtime. 90 tablet 3  . aspirin EC 81 MG EC tablet Take 1 tablet (81 mg total) by mouth daily. 30 tablet 0  . atorvastatin (LIPITOR) 40 MG tablet Take 1 tablet (40 mg total) by mouth at bedtime. 90 tablet 2  . cetirizine (ZYRTEC) 10 MG tablet Take 10 mg by mouth daily.    . Cholecalciferol (VITAMIN D) 2000 units CAPS Take 2,000 Units by mouth daily.    . empagliflozin  (JARDIANCE) 10 MG TABS tablet Take 10 mg by mouth daily before breakfast. 90 tablet 3  . Lancets (ONETOUCH ULTRASOFT) lancets 1 each by Other route 2 (two) times daily. Dx E11.9 100 each 11  . levETIRAcetam (KEPPRA) 500 MG tablet Take 1 tablet (500 mg total) by mouth 2 (two) times daily. 180 tablet 3  . metFORMIN (GLUCOPHAGE) 500 MG tablet Take 2 tablets (1,000 mg total) by mouth 2 (two) times daily with a meal. 360 tablet 2  . Multiple Vitamins-Minerals (MULTIVITAMIN PO) Take 1 tablet by mouth daily.     [name] ULTRA test strip USE TWICE DAILY 100 each 11  . tamsulosin (FLOMAX) 0.4 MG CAPS capsule Take 1 capsule (0.4 mg total) by mouth daily after supper. 30 capsule 1  . telmisartan (MICARDIS) 80 MG tablet Take 1 tablet by mouth once daily 90 tablet 0  . valACYclovir (VALTREX) 500 MG tablet Take 1 tablet (500 mg total) by mouth daily. 90 tablet 2  . Ascorbic Acid (VITAMIN C) 1000 MG tablet Take 1,000 mg by mouth daily.     No facility-administered medications prior to visit.    Review of Systems  Review of Systems  Constitutional: Negative.   Respiratory: Negative for cough, chest tightness, shortness of breath and wheezing.    Physical Exam  BP 120/76   Pulse 67   Temp (!) 97.4 F (36.3 C) (Oral)   Ht 6' (1.829 m)   Wt 198 lb (89.8 kg)   SpO2 92%   BMI 26.85 kg/m  Physical Exam Constitutional:      Appearance: Normal appearance.  HENT:     Head: Normocephalic and atraumatic.     Mouth/Throat:     Mouth: Mucous membranes are moist.     Pharynx: Oropharynx is clear.  Cardiovascular:     Rate and Rhythm: Normal rate and regular rhythm.  Pulmonary:     Effort: Pulmonary effort is normal.     Breath sounds: Normal breath sounds.  Musculoskeletal:        General: Normal range of motion.  Skin:    General: Skin is warm and dry.  Neurological:     General: No focal deficit present.     Mental Status: He is alert and oriented to person, place, and time. Mental status is  at baseline.  Psychiatric:        Mood and Affect: Mood normal.        Behavior: Behavior normal.        Thought Content: Thought content normal.        Judgment: Judgment normal.      Lab Results:  CBC    Component Value Date/Time   WBC 5.6 01/05/2020 1019   RBC 4.53 01/05/2020 1019   HGB 14.1 01/05/2020 1019   HGB 14.2 12/15/2019 1333   HCT 43.0 01/05/2020 1019   HCT 44.8 12/15/2019 1333   PLT 142 (L) 01/05/2020 1019   PLT 160 12/15/2019 1333  MCV 94.9 01/05/2020 1019   MCV 95 12/15/2019 1333   MCV 89 07/29/2011 1102   MCH 31.1 01/05/2020 1019   MCHC 32.8 01/05/2020 1019   RDW 13.2 01/05/2020 1019   RDW 12.4 12/15/2019 1333   RDW 13.8 07/29/2011 1102   LYMPHSABS 1.1 12/15/2019 1333   LYMPHSABS 1.4 07/29/2011 1102   MONOABS 0.6 10/14/2019 1025   MONOABS 0.4 07/29/2011 1102   EOSABS 0.3 12/15/2019 1333   EOSABS 0.2 07/29/2011 1102   BASOSABS 0.0 12/15/2019 1333   BASOSABS 0.0 07/29/2011 1102    BMET    Component Value Date/Time   NA 140 12/15/2019 1333   NA 140 07/29/2011 1102   K 4.7 12/15/2019 1333   K 3.8 07/29/2011 1102   CL 104 12/15/2019 1333   CL 102 07/29/2011 1102   CO2 21 12/15/2019 1333   CO2 29 07/29/2011 1102   GLUCOSE 99 12/15/2019 1333   GLUCOSE 168 (H) 10/14/2019 1025   GLUCOSE 165 (H) 07/29/2011 1102   BUN 26 12/15/2019 1333   BUN 18 07/29/2011 1102   CREATININE 1.22 12/15/2019 1333   CREATININE 0.99 07/29/2011 1102   CALCIUM 9.3 12/15/2019 1333   CALCIUM 8.5 07/29/2011 1102   GFRNONAA 61 12/15/2019 1333   GFRNONAA >60 07/29/2011 1102   GFRAA 70 12/15/2019 1333   GFRAA >60 07/29/2011 1102    BNP    Component Value Date/Time   BNP 23.5 09/13/2019 0052    ProBNP No results found for: PROBNP  Imaging: CUP PACEART REMOTE DEVICE CHECK  Result Date: 02/07/2020 Carelink summary report received. Battery status OK. Normal device function. No new symptom episodes, tachy episodes, brady, or pause episodes. No new AF episodes. Monthly  summary reports and ROV/PRN  Intravitreal Injection, Pharmacologic Agent - OD - Right Eye  Result Date: 01/31/2020 Time Out 01/31/2020. 10:20 AM. Confirmed correct patient, procedure, site, and patient consented. Anesthesia Topical anesthesia was used. Anesthetic medications included Akten 3.5%. Procedure Preparation included 5% betadine to ocular surface, 10% betadine to eyelids, Tobramycin 0.3%, Ofloxacin . A 30 gauge needle was used. Injection: 1.25 mg Bevacizumab (AVASTIN) SOLN   NDC: [address]-6, Lot: [id number]   Route: Intravitreal, Site: Right Eye, Waste: 0 mg Post-op Post injection exam found visual acuity of at least counting fingers. The patient tolerated the procedure well. There were no complications. The patient received written and verbal post procedure care education. Post injection medications were not given.   OCT, Retina - OU - Both Eyes  Result Date: 01/31/2020 Right Eye Quality was good. Scan locations included subfoveal. Central Foveal Thickness: 351. Progression has worsened. Findings include abnormal foveal contour, retinal drusen , subretinal fluid. Left Eye Quality was good. Scan locations included subfoveal. Central Foveal Thickness: 289. Findings include no IRF, no SRF, retinal drusen .   Fluorescein Angiography Optos (Transit OD)  Result Date: 01/31/2020 Injection: 500 mg Fluorescein Sodium 10 % injection   NDC: [phone], Lot: [id number]   Route: Intravenous, Site: Left ArmRight Eye Early phase findings include leakage, choroidal neovascularization. Mid/Late phase findings include window defect, leakage, choroidal neovascularization. Choroidal neovascularization is juxtafoveal. Left Eye Mid/Late phase findings include window defect. Choroidal neovascularization is not present. 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 Disc findings include normal observations. Macula : drusen. Vessels : normal observations. Periphery : normal observations. Left Eye Progression has been  stable. Disc findings include normal observations. Macula : drusen. Vessels : normal observations. Periphery : normal observations.     Assessment & Plan:   Asthma - Former smoker quit in 1986. 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 Patient is asymptomatic. Rare SABA use. Denies shortness of breath, wheezing or cough. Do not recommend daily maintenance inhaler unless symptoms worsen.  - 02/17/2020 FEV1 2.60 (78%), ratio 74 +BD / Moderate obstruction with reversibility. Normal diffusion capacity.  - Continue Zyrtec 10mg  daily and PRN Ventolin  - Follow up with pulmonary as needed basis    Martyn Ehrich, NP 02/17/2020

## 2020-02-17 NOTE — Addendum Note (Signed)
Addended by: Satira Sark D on: 02/17/2020 10:53 AM   Modules accepted: Orders

## 2020-02-20 ENCOUNTER — Telehealth: Payer: Self-pay | Admitting: Adult Health

## 2020-02-20 ENCOUNTER — Encounter: Payer: Self-pay | Admitting: *Deleted

## 2020-02-20 NOTE — Telephone Encounter (Signed)
I called pt back to relay from 03-09-20 that he can drive.  He then relayed that he has been driving for the last 5 months. (not in work capacity).  I relayed that he was not to drive for 6 months, this is [place] LAW.  He had not surrendered his license.

## 2020-02-20 NOTE — Telephone Encounter (Signed)
As long as no reoccurring seizure activity or episode and he remains compliant with Keppra 500 mg twice daily, he is cleared to return back to driving after 02/05/8674.  If he surrendered his license to [facility], he will need to request forms from [facility] for completion but if his license was not surrendered he may return back to driving after above date.  If he needs letter for work, letter can be provided that he is cleared to return back to driving after above date.  Thank you.

## 2020-02-20 NOTE — Telephone Encounter (Signed)
Spoke to pt he is doing well, no episodes since last 09-07-2019.  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  6 mon would be 03-09-20.  Next appt 05-09-20 with Jm/NP.  Please advise on driving, needs letter.

## 2020-02-20 NOTE — Progress Notes (Signed)
Called and spoke with patient about xray result per Dr Melvyn Novas. All questions answered and patient expressed understanding. Nothing further needed at this time.

## 2020-02-20 NOTE — Progress Notes (Signed)
Thank you Dr. Melvyn Novas, appreciate the discussion

## 2020-02-20 NOTE — Telephone Encounter (Signed)
Pt called wanting to speak to provider about getting his license back. Please advise.

## 2020-02-21 ENCOUNTER — Telehealth: Payer: Self-pay | Admitting: Nurse Practitioner

## 2020-02-21 NOTE — Telephone Encounter (Signed)
Copied from [place] [id number]. Topic: Complaint - Billing/Coding >> Feb 21, 2020  2:10 PM Percell Belt A wrote: DOS: 2/17/201 and 09/26/19 Details of complaint: pt already call our billing department. They stated this has to do with how it was coded.  Pt rec'd a bill for 31.59.  This was for a out of network copay.   How would the patient like to see this issue resolved? Pt would like this recoded.  The was in in [place] name and not [place] name    Route to Engineer, building services.

## 2020-02-29 ENCOUNTER — Telehealth: Payer: PPO | Admitting: Pharmacist

## 2020-02-29 ENCOUNTER — Telehealth: Payer: Self-pay | Admitting: Pharmacist

## 2020-02-29 NOTE — Chronic Care Management (AMB) (Signed)
  Chronic Care Management   Follow Up Note   02/29/2020 Name: Andrew Cobb MRN: [id number] DOB: 24-Nov-1950  Referred by: Andrew Lick, NP Reason for referral : No chief complaint on file.   Andrew Cobb is a 69 y.o. year old male who is a primary care patient of Andrew Cobb, Andrew Faster, NP. The CCM team was consulted for assistance with chronic disease management and care coordination needs.    Review of patient status, including review of consultants reports, relevant laboratory and other test results, and collaboration with appropriate care team members and the patient's provider was performed as part of comprehensive patient evaluation and provision of chronic care management services.    SDOH (Social Determinants of Health) assessments performed: No See Care Plan activities for detailed interventions related to Andrew Cobb)     Outpatient Encounter Medications as of 02/29/2020  Medication Sig  . amitriptyline (ELAVIL) 25 MG tablet Take 1 tablet (25 mg total) by mouth at bedtime.  [name] Kitchen aspirin EC 81 MG EC tablet Take 1 tablet (81 mg total) by mouth daily.  [name] Kitchen atorvastatin (LIPITOR) 40 MG tablet Take 1 tablet (40 mg total) by mouth at bedtime.  . cetirizine (ZYRTEC) 10 MG tablet Take 10 mg by mouth daily.  . Cholecalciferol (VITAMIN D) 2000 units CAPS Take 2,000 Units by mouth daily.  . empagliflozin (JARDIANCE) 10 MG TABS tablet Take 10 mg by mouth daily before breakfast.  . Lancets (ONETOUCH ULTRASOFT) lancets 1 each by Other route 2 (two) times daily. Dx E11.9  . levETIRAcetam (KEPPRA) 500 MG tablet Take 1 tablet (500 mg total) by mouth 2 (two) times daily.  . metFORMIN (GLUCOPHAGE) 500 MG tablet Take 2 tablets (1,000 mg total) by mouth 2 (two) times daily with a meal.  . Multiple Vitamins-Minerals (MULTIVITAMIN PO) Take 1 tablet by mouth daily.   Andrew Cobb ULTRA test strip USE TWICE DAILY  . tamsulosin (FLOMAX) 0.4 MG CAPS capsule Take 1 capsule (0.4 mg total) by mouth daily after  supper.  . telmisartan (MICARDIS) 80 MG tablet Take 1 tablet by mouth once daily  . valACYclovir (VALTREX) 500 MG tablet Take 1 tablet (500 mg total) by mouth daily.   No facility-administered encounter medications on file as of 02/29/2020.       Andrew Cobb was scheduled for follow-up CCM appointment today. He states the billing issue has not been resolved.  Spouse, Andrew Cobb states she received a letter stating the maximum allowable amount for CCM services had been reached and a bill for ~$39 and a collections notice.   Per his wishes, I have rescheduled Andrew Cobb for Sept. 10th to allow time for this issue to be resolved. I have informed  Glass blower/designer, Andrew Cobb, and asked Andrew Cobb to fax [place] copies of these letters.  Telephone follow up appointment with care management team member scheduled for: September 10/21   Andrew Cobb. Andrew Cobb PharmD, [facility] [phone]

## 2020-03-01 ENCOUNTER — Encounter (INDEPENDENT_AMBULATORY_CARE_PROVIDER_SITE_OTHER): Payer: Self-pay | Admitting: Ophthalmology

## 2020-03-01 ENCOUNTER — Ambulatory Visit (INDEPENDENT_AMBULATORY_CARE_PROVIDER_SITE_OTHER): Payer: PPO | Admitting: Ophthalmology

## 2020-03-01 ENCOUNTER — Other Ambulatory Visit: Payer: Self-pay

## 2020-03-01 DIAGNOSIS — H35721 Serous detachment of retinal pigment epithelium, right eye: Secondary | ICD-10-CM | POA: Diagnosis not present

## 2020-03-01 DIAGNOSIS — E119 Type 2 diabetes mellitus without complications: Secondary | ICD-10-CM | POA: Insufficient documentation

## 2020-03-01 DIAGNOSIS — H353211 Exudative age-related macular degeneration, right eye, with active choroidal neovascularization: Secondary | ICD-10-CM | POA: Diagnosis not present

## 2020-03-01 MED ORDER — BEVACIZUMAB CHEMO INJECTION 1.25MG/0.05ML SYRINGE FOR KALEIDOSCOPE
1.2500 mg | INTRAVITREAL | Status: AC | PRN
  Administered 2020-03-01: 1.25 mg via INTRAVITREAL

## 2020-03-01 NOTE — Assessment & Plan Note (Signed)
Part of the wet ARMD, improved

## 2020-03-01 NOTE — Assessment & Plan Note (Signed)
Less active subfoveal lesion, 1 month status post intravitreal Avastin will repeat today

## 2020-03-01 NOTE — Progress Notes (Signed)
03/01/2020     CHIEF COMPLAINT Patient presents for Retina Follow Up   HISTORY OF PRESENT ILLNESS: Andrew Cobb is a 69 y.o. male who presents to the clinic today for:   HPI    Retina Follow Up    Patient presents with  Wet AMD.  In right eye.  Severity is moderate.  Duration of 4 weeks.  Since onset it is stable.  I, the attending physician,  performed the HPI with the patient and updated documentation appropriately.          Comments    4 Week Wet AMD f\u OD. Possible Avastin OD. OCT  Pt states no changes in vision. Denies any complaints.       Last edited by [name] on 03/01/2020  8:43 AM. (History)      Referring physician: [name], NP [place],  [place] [id number]  HISTORICAL INFORMATION:   Selected notes from the MEDICAL RECORD NUMBER    Lab Results  Component Value Date   HGBA1C 6.3 12/19/2019     CURRENT MEDICATIONS: No current outpatient medications on file. (Ophthalmic Drugs)   No current facility-administered medications for this visit. (Ophthalmic Drugs)   Current Outpatient Medications (Other)  Medication Sig  . amitriptyline (ELAVIL) 25 MG tablet Take 1 tablet (25 mg total) by mouth at bedtime.  [name] Kitchen aspirin EC 81 MG EC tablet Take 1 tablet (81 mg total) by mouth daily.  [name] Kitchen atorvastatin (LIPITOR) 40 MG tablet Take 1 tablet (40 mg total) by mouth at bedtime.  . cetirizine (ZYRTEC) 10 MG tablet Take 10 mg by mouth daily.  . Cholecalciferol (VITAMIN D) 2000 units CAPS Take 2,000 Units by mouth daily.  . empagliflozin (JARDIANCE) 10 MG TABS tablet Take 10 mg by mouth daily before breakfast.  . Lancets (ONETOUCH ULTRASOFT) lancets 1 each by Other route 2 (two) times daily. Dx E11.9  . levETIRAcetam (KEPPRA) 500 MG tablet Take 1 tablet (500 mg total) by mouth 2 (two) times daily.  . metFORMIN (GLUCOPHAGE) 500 MG tablet Take 2 tablets (1,000 mg total) by mouth 2 (two) times daily with a meal.  . Multiple Vitamins-Minerals  (MULTIVITAMIN PO) Take 1 tablet by mouth daily.   [name] ULTRA test strip USE TWICE DAILY  . tamsulosin (FLOMAX) 0.4 MG CAPS capsule Take 1 capsule (0.4 mg total) by mouth daily after supper.  . telmisartan (MICARDIS) 80 MG tablet Take 1 tablet by mouth once daily  . valACYclovir (VALTREX) 500 MG tablet Take 1 tablet (500 mg total) by mouth daily.   No current facility-administered medications for this visit. (Other)      REVIEW OF SYSTEMS:    ALLERGIES Allergies  Allergen Reactions  . Succinylsulphathiazole Rash  . Sulfamethoxazole-Trimethoprim Rash  . Tetracyclines & Related Rash    PAST MEDICAL HISTORY Past Medical History:  Diagnosis Date  . Arthritis   . Asthma   . Cancer [facility])    prostate  . Chronic venous insufficiency    varicose vein lower extremity with inflammation  . Coronary artery disease 1996   two stents placed   . Diabetes mellitus without complication ([place])    type 2 on metformin  . GERD (gastroesophageal reflux disease)    no issues since gastric bypass surgery as stated per pt  . Hyperlipidemia   . Hypogonadism in male   . MRSA (methicillin resistant Staphylococcus aureus) infection    07/30/2008 thru 08/07/2008  . Sleep apnea  on BIPAP  . Stented coronary artery   . Stroke ([place])   . Thrombocythemia [facility])    Past Surgical History:  Procedure Laterality Date  . ANGIOPLASTY    . ANGIOPLASTY     with stent 04/07/1995  . ANTERIOR CERVICAL DECOMPRESSION/DISCECTOMY FUSION 4 LEVELS N/A 08/17/2017   Procedure: Anterior discectomy with fusion and plate fixation Cervical Three-Four, Four-Five, Five-Six, and Six-Seven Fusion;  Surgeon: Ditty, Kevan Ny, MD;  Location: [place];  Service: Neurosurgery;  Laterality: N/A;  Anterior discectomy with fusion and plate fixation Cervical Three-Four, Four-Five, Five-Six, and Six-Seven Fusion   . APPENDECTOMY     1966  . BUBBLE STUDY  09/20/2018   Procedure: BUBBLE STUDY;  Surgeon: Josue Hector, MD;   Location: [place];  Service: Cardiovascular;;  . [place]  . CARDIOVASCULAR STRESS TEST     07/31/2011  . CARPAL TUNNEL RELEASE Left   . CHOLECYSTECTOMY     2006  . colonscopy      08/25/2012  . EYE SURGERY Bilateral    cataract  . FUNCTIONAL ENDOSCOPIC SINUS SURGERY     11/10/2013  . GASTRIC BYPASS     10/05/2012  . HERNIA REPAIR     left inguinal 1981  . INCISION AND DRAINAGE ABSCESS Right 02/26/2017   Procedure: INCISION AND DRAINAGE ABSCESS;  Surgeon: Nickie Retort, MD;  Location: [facility];  Service: Urology;  Laterality: Right;  . JOINT REPLACEMENT     bilateral  . left ankle surgery      05/03/2003   . left carpel tunnel      09/18/1993  . left knee meniscal tear      01/25/2010  . left knee meniscal tear repair      05/04/1996  . left rotator cuff repair      05/03/2003   . LOOP RECORDER INSERTION N/A 09/20/2018   Procedure: LOOP RECORDER INSERTION;  Surgeon: Evans Lance, MD;  Location: [place] CV LAB;  Service: Cardiovascular;  Laterality: N/A;  . REPLACEMENT TOTAL KNEE BILATERAL  07/13/2015  . right ankle surgery      fracture has 2 screws 07/07/1997  . right carpel tunnel      05/16/1992  . right shoulder replacement      01/27/2006  . SCROTAL EXPLORATION Right 02/26/2017   Procedure: SCROTUM EXPLORATION;  Surgeon: Nickie Retort, MD;  Location: [facility];  Service: Urology;  Laterality: Right;  . TEE WITHOUT CARDIOVERSION N/A 09/20/2018   Procedure: TRANSESOPHAGEAL ECHOCARDIOGRAM (TEE);  Surgeon: Josue Hector, MD;  Location: [facility] ENDOSCOPY;  Service: Cardiovascular;  Laterality: N/A;  . TOTAL KNEE ARTHROPLASTY Left 07/13/2015   Procedure: LEFT TOTAL KNEE ARTHROPLASTY;  Surgeon: Gaynelle Arabian, MD;  Location: [facility];  Service: Orthopedics;  Laterality: Left;    FAMILY HISTORY Family History  Problem Relation Age of Onset  . Cancer Mother        pancreatic  . Diabetes Mother   . Stroke Mother   . Heart disease Mother   .  Hyperlipidemia Mother   . Hypertension Mother   . Heart attack Mother   . Heart disease Father   . Stroke Father   . Diabetes Father   . Hypertension Father   . Heart attack Father   . Hyperlipidemia Father   . Pancreatic cancer Father   . Cancer Sister   . Cancer Brother        lung  . Cancer Brother   . Kidney cancer Neg Hx   .  Bladder Cancer Neg Hx   . Prostate cancer Neg Hx     SOCIAL HISTORY Social History   Tobacco Use  . Smoking status: Former Smoker    Packs/day: 1.00    Years: 10.00    Pack years: 10.00    Types: Cigarettes    Quit date: 07/07/1984    Years since quitting: 35.6  . Smokeless tobacco: Never Used  . Tobacco comment: quit 1986  Vaping Use  . Vaping Use: Never used  Substance Use Topics  . Alcohol use: No    Alcohol/week: 0.0 standard drinks  . Drug use: No         OPHTHALMIC EXAM:  Base Eye Exam    Visual Acuity (Snellen - Linear)      Right Left   Dist [place] 20/30 + 20/25 +       Tonometry (Tonopen, 8:47 AM)      Right Left   Pressure 10 8       Pupils      Pupils Dark Light Shape React APD   Right PERRL 4 3 Round Brisk None   Left PERRL 4 3 Round Brisk None       Visual Fields (Counting fingers)      Left Right    Full Full       Neuro/Psych    Oriented x3: Yes   Mood/Affect: Normal       Dilation    Both eyes: 1.0% Mydriacyl, 2.5% Phenylephrine @ 8:47 AM        Slit Lamp and Fundus Exam    External Exam      Right Left   External Normal Normal       Slit Lamp Exam      Right Left   Lids/Lashes Normal Normal   Conjunctiva/Sclera White and quiet White and quiet   Cornea Clear Clear   Anterior Chamber Deep and quiet Deep and quiet   Iris Round and reactive Round and reactive   Lens Posterior chamber intraocular lens Posterior chamber intraocular lens   Anterior Vitreous Normal Normal       Fundus Exam      Right Left   Posterior Vitreous Posterior vitreous detachment    Disc Normal    C/D Ratio 0.5     Macula Hard drusen, no exudates, Macular thickening, Soft drusen, Pigmented atrophy    Vessels Normal, , no DR    Periphery Normal           IMAGING AND PROCEDURES  Imaging and Procedures for 03/01/20  OCT, Retina - OU - Both Eyes       Right Eye Quality was good. Scan locations included subfoveal. Central Foveal Thickness: 333. Progression has improved. Findings include subretinal fluid, retinal drusen .   Left Eye Quality was good. Scan locations included subfoveal. Central Foveal Thickness: 285. Progression has been stable. Findings include retinal drusen .   Notes Less Subretinal fluid right eye today       Intravitreal Injection, Pharmacologic Agent - OD - Right Eye       Time Out 03/01/2020. 9:38 AM. Confirmed correct patient, procedure, site, and patient consented.   Anesthesia Topical anesthesia was used. Anesthetic medications included [place] 3.5%.   Procedure Preparation included Tobramycin 0.3%, Ofloxacin , 10% betadine to eyelids, 5% betadine to ocular surface. A supplied needle was used.   Injection:  1.25 mg Bevacizumab (AVASTIN) SOLN   NDC: [address]-1, Lot: [id number]   Route: Intravitreal, Site: Right Eye, Waste:  0 mg  Post-op Post injection exam found visual acuity of at least counting fingers. The patient tolerated the procedure well. There were no complications. The patient received written and verbal post procedure care education. Post injection medications were not given.                 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 The patient advised to maintain good blood  glucose control, excellent blood pressure control, and favorable levels of cholesterol, low density lipoprotein, and high density lipoproteins. Follow up in 1 year was recommended. Explained that fluctuations in visual acuity , or "out of focus", may result from large variations of blood sugar control.      ICD-10-CM   1. Exudative age-related macular degeneration of right eye with active choroidal neovascularization (HCC)  H35.3211 OCT, Retina - OU - Both Eyes    Intravitreal Injection, Pharmacologic Agent - OD - Right Eye    Bevacizumab (AVASTIN) SOLN 1.25 mg  2. Serous detachment of retinal pigment epithelium of right eye  H35.721   3. Diabetes mellitus without complication ([place])  J85.6     1.  Repeat injection of intravitreal Avastin today and examination in 5 weeks  2.  3.  Ophthalmic Meds Ordered this visit:  Meds ordered this encounter  Medications  . Bevacizumab (AVASTIN) SOLN 1.25 mg       Return in about 1 month (around 04/01/2020) for dilate, AVASTIN OCT.  There are no Patient Instructions on file for this visit.   Explained the diagnoses, plan, and follow up with the patient and they expressed understanding.  Patient expressed understanding of the importance of proper follow up care.   [name] Lilya Smitherman M.D. Diseases & Surgery of the Retina and Vitreous Retina & Diabetic [place] 03/01/20     Abbreviations: M myopia (nearsighted); A astigmatism; H hyperopia (farsighted); P presbyopia; Mrx spectacle prescription;  CTL contact lenses; OD right eye; OS left eye; OU both eyes  XT exotropia; ET esotropia; PEK punctate epithelial keratitis; PEE punctate epithelial erosions; DES dry eye syndrome; MGD meibomian gland dysfunction; ATs artificial tears; PFAT's preservative free artificial tears; [place] nuclear sclerotic cataract; PSC posterior subcapsular cataract; ERM epi-retinal membrane; PVD posterior vitreous detachment; RD retinal detachment; DM diabetes mellitus; DR  diabetic retinopathy; NPDR non-proliferative diabetic retinopathy; PDR proliferative diabetic retinopathy; CSME clinically significant macular edema; DME diabetic macular edema; dbh dot blot hemorrhages; CWS cotton wool spot; POAG primary open angle glaucoma; C/D cup-to-disc ratio; HVF humphrey visual field; GVF goldmann visual field; OCT optical coherence tomography; IOP intraocular pressure; BRVO Branch retinal vein occlusion; CRVO central retinal vein occlusion; CRAO central retinal artery occlusion; BRAO branch retinal artery occlusion; RT retinal tear; SB scleral buckle; PPV pars plana vitrectomy; VH Vitreous hemorrhage; PRP panretinal laser photocoagulation; IVK intravitreal kenalog; VMT vitreomacular traction; MH Macular hole;  NVD neovascularization of the disc; NVE neovascularization elsewhere; AREDS age related eye disease study; ARMD age related macular degeneration; POAG primary open angle glaucoma; EBMD epithelial/anterior basement membrane dystrophy; ACIOL anterior chamber intraocular lens; IOL intraocular lens; PCIOL posterior chamber intraocular lens; Phaco/IOL phacoemulsification with intraocular lens placement; [place] photorefractive keratectomy; LASIK laser assisted in situ keratomileusis; HTN hypertension; DM diabetes mellitus;  COPD chronic obstructive pulmonary disease

## 2020-03-01 NOTE — Assessment & Plan Note (Signed)

## 2020-03-05 ENCOUNTER — Telehealth: Payer: PPO

## 2020-03-05 DIAGNOSIS — M25512 Pain in left shoulder: Secondary | ICD-10-CM | POA: Diagnosis not present

## 2020-03-06 ENCOUNTER — Encounter (INDEPENDENT_AMBULATORY_CARE_PROVIDER_SITE_OTHER): Payer: PPO | Admitting: Ophthalmology

## 2020-03-07 ENCOUNTER — Encounter: Payer: Self-pay | Admitting: Radiation Oncology

## 2020-03-07 ENCOUNTER — Other Ambulatory Visit: Payer: Self-pay

## 2020-03-07 ENCOUNTER — Ambulatory Visit: Payer: Self-pay | Admitting: General Practice

## 2020-03-07 ENCOUNTER — Telehealth: Payer: Self-pay | Admitting: General Practice

## 2020-03-07 DIAGNOSIS — Z8546 Personal history of malignant neoplasm of prostate: Secondary | ICD-10-CM

## 2020-03-07 DIAGNOSIS — I2510 Atherosclerotic heart disease of native coronary artery without angina pectoris: Secondary | ICD-10-CM

## 2020-03-07 DIAGNOSIS — Z8673 Personal history of transient ischemic attack (TIA), and cerebral infarction without residual deficits: Secondary | ICD-10-CM

## 2020-03-07 DIAGNOSIS — E1169 Type 2 diabetes mellitus with other specified complication: Secondary | ICD-10-CM

## 2020-03-07 DIAGNOSIS — E785 Hyperlipidemia, unspecified: Secondary | ICD-10-CM

## 2020-03-07 DIAGNOSIS — I2584 Coronary atherosclerosis due to calcified coronary lesion: Secondary | ICD-10-CM

## 2020-03-07 DIAGNOSIS — C61 Malignant neoplasm of prostate: Secondary | ICD-10-CM

## 2020-03-07 DIAGNOSIS — I10 Essential (primary) hypertension: Secondary | ICD-10-CM

## 2020-03-07 DIAGNOSIS — E1165 Type 2 diabetes mellitus with hyperglycemia: Secondary | ICD-10-CM

## 2020-03-07 DIAGNOSIS — E1159 Type 2 diabetes mellitus with other circulatory complications: Secondary | ICD-10-CM

## 2020-03-08 ENCOUNTER — Other Ambulatory Visit: Payer: Self-pay | Admitting: *Deleted

## 2020-03-08 ENCOUNTER — Ambulatory Visit
Admission: RE | Admit: 2020-03-08 | Discharge: 2020-03-08 | Disposition: A | Discharge status: Home / Self Care | Payer: PPO | Source: Ambulatory Visit | Attending: Radiation Oncology | Admitting: Radiation Oncology

## 2020-03-08 ENCOUNTER — Encounter: Payer: Self-pay | Admitting: Radiation Oncology

## 2020-03-08 VITALS — BP 136/83 | HR 80 | Temp 98.0°F | Resp 16 | Wt 197.0 lb

## 2020-03-08 DIAGNOSIS — Z923 Personal history of irradiation: Secondary | ICD-10-CM | POA: Diagnosis not present

## 2020-03-08 DIAGNOSIS — C61 Malignant neoplasm of prostate: Secondary | ICD-10-CM | POA: Diagnosis not present

## 2020-03-08 MED ORDER — TAMSULOSIN HCL 0.4 MG PO CAPS: 0.4000 mg | ORAL_CAPSULE | Freq: Every day | ORAL | 1 refills | Status: DC

## 2020-03-08 NOTE — Progress Notes (Signed)
Radiation Oncology Follow up Note  Name: Andrew Cobb   Date:   03/08/2020 MRN:  [id number] DOB: Apr 13, 1951    This 69 y.o. 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  REFERRING PROVIDER: [name], NP  HPI: Patient is a 69 year old male now at 1 month having completed IMRT radiation therapy.  To his prostate for Gleason 6 adenocarcinoma of the prostate.  He is seen today in routine follow-up is doing well no significant increase in lower urinary tract symptoms or diarrhea.  He is feeling well.  COMPLICATIONS OF TREATMENT: none  FOLLOW UP COMPLIANCE: keeps appointments   PHYSICAL EXAM:  BP 136/83 (BP Location: Right Arm, Patient Position: Sitting, Cuff Size: Normal)    Pulse 80    Temp 98 F (36.7 C) (Tympanic)    Resp 16    Wt 197 lb (89.4 kg)    BMI 26.72 kg/m  Well-developed well-nourished patient in NAD. HEENT reveals PERLA, EOMI, discs not visualized.  Oral cavity is clear. No oral mucosal lesions are identified. Neck is clear without evidence of cervical or supraclavicular adenopathy. Lungs are clear to A&P. Cardiac examination is essentially unremarkable with regular rate and rhythm without murmur rub or thrill. Abdomen is benign with no organomegaly or masses noted. Motor sensory and DTR levels are equal and symmetric in the upper and lower extremities. Cranial nerves II through XII are grossly intact. Proprioception is intact. No peripheral adenopathy or edema is identified. No motor or sensory levels are noted. Crude visual fields are within normal range.  RADIOLOGY RESULTS: No current films to review  PLAN: Present time patient is doing well very low side effect profile from his recent IMRT prostate radiation.  I have asked to see him back in 3 months for follow-up with a PSA prior to that visit.  Patient knows to call sooner  with any concerns.  I would like to take this opportunity to thank you for allowing me to participate in the care of your patient.Noreene Filbert, MD

## 2020-03-08 NOTE — Patient Instructions (Signed)
Visit Information  Goals Addressed              This Visit's Progress   .  RNCM: "I am feeling better since getting out of the hospital" (pt-stated)        [place] (see longtitudinal plan of care for additional care plan information)  Current Barriers:  . Chronic Disease Management support, education, and care coordination needs related to HTN, HLD, DMII, and Pulmonary Disease  Clinical Goal(s) related to HTN, HLD, DMII, and Pulmonary Disease:  Over the next 120 days, patient will:  . Work with the care management team to address educational, disease management, and care coordination needs  . Begin or continue self health monitoring activities as directed today Measure and record cbg (blood glucose) 2 times daily, Measure and record blood pressure 5 times per week, and adhere to a Heart healthy/ADA diet . Call provider office for new or worsened signs and symptoms Blood glucose findings outside established parameters, Blood pressure findings outside established parameters, Oxygen saturation lower than established parameter, Shortness of breath, and New or worsened symptom related to recent hospitalization due to pneumonia and the need to come home on supplemental oxygen . Call care management team with questions or concerns . Verbalize basic understanding of patient centered plan of care established today  Interventions related to HTN, HLD, DMII, and Pulmonary Disease:  . Evaluation of current treatment plans and patient's adherence to plan as established by provider. Patient saw pcp 09/19/2019 post discharge from the hospital. The patient states he is doing much better and is not even having to use the oxygen as much. His oxygen sats are staying 95 to 98%.  Is using the oxygen 2 liters via [place] at night. The patient also verbalized that his blood sugars have come down. States they have been in the 120's to 130's.  Patient verbalized his blood pressure is "great". The patient verbalized  today that he is no longer using oxygen at all and is turning it back in. The patient was at the gym working out. He is doing 5 miles a day at the gym.  His blood sugars are 105-110 and his blood pressure is systolic 591-638 and diastolic 46-65.  Praised for keeping good track of his numbers. Praised for exercise goal being met. The patient verbalized he feels great. 03-07-2020: The patient is doing well. The patient states he will see Jolene soon and hopes to get approval to get his shoulder surgery. The patient states that he will want to do this after his duties of being [employer] are over. He starts work back as [employer] on 04-28-2020.  The patient states he is feeling much better.  . Assessed patient understanding of disease states: Patient states he understands his conditions well and is compliant with plan of care and medication regimen  . Assessed patient's education and care coordination needs- working with CCM pharmacist. Denies any needs at this time . Provided disease specific education to patient: Education and support given on Heart healthy/ADA diet. The patient states he does not eat salt. 03-07-2020: the patient endorses compliance with a heart healthy/ADA diet. The patient states he has lost about 20 pounds.  [name] with appropriate clinical care team members regarding patient needs . Evaluation of upcoming appointments. The patient sees the pcp on 03-20-2020 at 0820 for follow up. The patient wants to work with the CCM team but has an issue with billing. Information provided to the patient on how  to contact the billing department and get the bill in a recoding status. The patients wife has been handling this and was not at home at the time of the call. Advised the patient to have the wife call the [facility] for questions or help.   Patient Self Care Activities related to HTN, HLD, DMII, and Pulmonary Disease:  . Patient is unable to independently self-manage chronic health  conditions  Please see past updates related to this goal by clicking on the "Past Updates" button in the selected goal      .  RNCM: Pt-"I have prostate cancer" (pt-stated)        CARE PLAN ENTRY (see longitudinal plan of care for additional care plan information)  Current Barriers:  [name] Kitchen Knowledge Deficits related to pathophysiology of prostate cancer . Care Coordination needs related to treatment and support in a patient with prostate cancer (disease states) . Chronic Disease Management support and education needs related to prostate cancer  Nurse Case Manager Clinical Goal(s):  [name] Kitchen Over the next 120 days, patient will verbalize understanding of plan for treatment of prostate cancer . Over the next 120 days, patient will work with [facility], [place] team, and pcp to address needs related to prostate cancer  . Over the next 120 days, patient will attend all scheduled medical appointments: the patient has several upcoming appointments for treatment of prostate cancer. The patient is happy that he will be able to take his treatments in [facility] . Over the next 120 days, patient will verbalize basic understanding of prostate disease process and self health management plan as evidenced by remaining independent during treatments and notifying team of changes in condition.   Interventions:  . Inter-disciplinary care team collaboration (see longitudinal plan of care) . Evaluation of current treatment plan related to prostate cancer treatment  and patient's adherence to plan as established by provider. 03-07-2020: the patient has finished his radiation treatments about 2 weeks ago. The patient will have blood work and testing in about 3 months to evaluate the effectiveness of the radiation. The patient states he lost about 20 pounds but he is doing well.  . Advised patient to call the RNCM or pcp for any needs or changes in condition . Provided education to patient re: taking medications as prescribed,  following recommendations of cancer specialist, and keeping appointments  . Discussed plans with patient for ongoing care management follow up and provided patient with direct contact information for care management team . Reviewed scheduled/upcoming provider appointments including: The patient has several upcoming appointments. The patient will have seed placement on 11/24/2019 and will start 40 treatments of radiation on 11/28/2019.  He will go for radiation 5 days a week for a total of 40 treatments. 03-07-2020: The patient has successfully completed his treatments. The patient feels the radiation treatments went well.  . Review of support system and available referrals to help the patient. The patient verbalized he is doing well now but if he has any needs he will let the RNCM know.   Patient Self Care Activities:  . Patient verbalizes understanding of plan to effectively treat prostate cancer . Self administers medications as prescribed . Attends all scheduled provider appointments . Performs ADL's independently . Performs IADL's independently . Calls provider office for new concerns or questions . Unable to independently manage prostate cancer   Please see past updates related to this goal by clicking on the "Past Updates" button in the selected goal  Patient verbalizes understanding of instructions provided today.   Telephone follow up appointment with care management team member scheduled for: 05-23-2020 at 1:45 pm   [place], MSN, [facility] Mobile: [phone]

## 2020-03-08 NOTE — Chronic Care Management (AMB) (Signed)
Care Management   Follow Up Note   03/08/2020 Name: Andrew Cobb MRN: [id number] DOB: 07-Aug-1950  Referred by: [name], NP Reason for referral : Care Coordination (RNCM follow up call for Chronic Disease Management and Care Coordination Needs)   Andrew Cobb is a 69 y.o. year old male who is a primary care patient of [name], [name], NP. The care management team was consulted for assistance with care management and care coordination needs.    Review of patient status, including review of consultants reports, relevant laboratory and other test results, and collaboration with appropriate care team members and the patient's provider was performed as part of comprehensive patient evaluation and provision of chronic care management services.    SDOH (Social Determinants of Health) assessments performed: Yes See Care Plan activities for detailed interventions related to [facility])     Advanced Directives: See Care Plan and Vynca application for related entries.   Goals Addressed              This Visit's Progress   .  RNCM: "I am feeling better since getting out of the hospital" (pt-stated)        [place] (see longtitudinal plan of care for additional care plan information)  Current Barriers:  . Chronic Disease Management support, education, and care coordination needs related to HTN, HLD, DMII, and Pulmonary Disease  Clinical Goal(s) related to HTN, HLD, DMII, and Pulmonary Disease:  Over the next 120 days, patient will:  . Work with the care management team to address educational, disease management, and care coordination needs  . Begin or continue self health monitoring activities as directed today Measure and record cbg (blood glucose) 2 times daily, Measure and record blood pressure 5 times per week, and adhere to a Heart healthy/ADA diet . Call provider office for new or worsened signs and symptoms Blood glucose findings outside established parameters,  Blood pressure findings outside established parameters, Oxygen saturation lower than established parameter, Shortness of breath, and New or worsened symptom related to recent hospitalization due to pneumonia and the need to come home on supplemental oxygen . Call care management team with questions or concerns . Verbalize basic understanding of patient centered plan of care established today  Interventions related to HTN, HLD, DMII, and Pulmonary Disease:  . Evaluation of current treatment plans and patient's adherence to plan as established by provider. Patient saw pcp 09/19/2019 post discharge from the hospital. The patient states he is doing much better and is not even having to use the oxygen as much. His oxygen sats are staying 95 to 98%.  Is using the oxygen 2 liters via [place] at night. The patient also verbalized that his blood sugars have come down. States they have been in the 120's to 130's.  Patient verbalized his blood pressure is "great". The patient verbalized today that he is no longer using oxygen at all and is turning it back in. The patient was at the gym working out. He is doing 5 miles a day at the gym.  His blood sugars are 105-110 and his blood pressure is systolic 158-309 and diastolic 40-76.  Praised for keeping good track of his numbers. Praised for exercise goal being met. The patient verbalized he feels great. 03-07-2020: The patient is doing well. The patient states he will see Jolene soon and hopes to get approval to get his shoulder surgery. The patient states that he will want to do this after his duties of  being [employer] are over. He starts work back as [employer] on 04-28-2020.  The patient states he is feeling much better.  . Assessed patient understanding of disease states: Patient states he understands his conditions well and is compliant with plan of care and medication regimen  . Assessed patient's education and care coordination needs- working with CCM pharmacist. Denies  any needs at this time . Provided disease specific education to patient: Education and support given on Heart healthy/ADA diet. The patient states he does not eat salt. 03-07-2020: the patient endorses compliance with a heart healthy/ADA diet. The patient states he has lost about 20 pounds.  [name] with appropriate clinical care team members regarding patient needs . Evaluation of upcoming appointments. The patient sees the pcp on 03-20-2020 at 0820 for follow up. The patient wants to work with the CCM team but has an issue with billing. Information provided to the patient on how to contact the billing department and get the bill in a recoding status. The patients wife has been handling this and was not at home at the time of the call. Advised the patient to have the wife call the [facility] for questions or help.   Patient Self Care Activities related to HTN, HLD, DMII, and Pulmonary Disease:  . Patient is unable to independently self-manage chronic health conditions  Please see past updates related to this goal by clicking on the "Past Updates" button in the selected goal      .  RNCM: Pt-"I have prostate cancer" (pt-stated)        CARE PLAN ENTRY (see longitudinal plan of care for additional care plan information)  Current Barriers:  [name] Kitchen Knowledge Deficits related to pathophysiology of prostate cancer . Care Coordination needs related to treatment and support in a patient with prostate cancer (disease states) . Chronic Disease Management support and education needs related to prostate cancer  Nurse Case Manager Clinical Goal(s):  [name] Kitchen Over the next 120 days, patient will verbalize understanding of plan for treatment of prostate cancer . Over the next 120 days, patient will work with [facility], [facility] team, and pcp to address needs related to prostate cancer  . Over the next 120 days, patient will attend all scheduled medical appointments: the patient has several upcoming appointments for treatment of  prostate cancer. The patient is happy that he will be able to take his treatments in [facility] . Over the next 120 days, patient will verbalize basic understanding of prostate disease process and self health management plan as evidenced by remaining independent during treatments and notifying team of changes in condition.   Interventions:  . Inter-disciplinary care team collaboration (see longitudinal plan of care) . Evaluation of current treatment plan related to prostate cancer treatment  and patient's adherence to plan as established by provider. 03-07-2020: the patient has finished his radiation treatments about 2 weeks ago. The patient will have blood work and testing in about 3 months to evaluate the effectiveness of the radiation. The patient states he lost about 20 pounds but he is doing well.  . Advised patient to call the RNCM or pcp for any needs or changes in condition . Provided education to patient re: taking medications as prescribed, following recommendations of cancer specialist, and keeping appointments  . Discussed plans with patient for ongoing care management follow up and provided patient with direct contact information for care management team . Reviewed scheduled/upcoming provider appointments including: The patient has several upcoming appointments. The patient will  have seed placement on 11/24/2019 and will start 40 treatments of radiation on 11/28/2019.  He will go for radiation 5 days a week for a total of 40 treatments. 03-07-2020: The patient has successfully completed his treatments. The patient feels the radiation treatments went well.  . Review of support system and available referrals to help the patient. The patient verbalized he is doing well now but if he has any needs he will let the RNCM know.   Patient Self Care Activities:  . Patient verbalizes understanding of plan to effectively treat prostate cancer . Self administers medications as prescribed . Attends all  scheduled provider appointments . Performs ADL's independently . Performs IADL's independently . Calls provider office for new concerns or questions . Unable to independently manage prostate cancer   Please see past updates related to this goal by clicking on the "Past Updates" button in the selected goal          Telephone follow up appointment with care management team member scheduled for: 05-23-2020 at 1:45 pm  [place], MSN, [facility] Mobile: [phone]

## 2020-03-11 LAB — CUP PACEART REMOTE DEVICE CHECK
Date Time Interrogation Session: 20210902031207
Implantable Pulse Generator Implant Date: 20200316

## 2020-03-13 ENCOUNTER — Ambulatory Visit (INDEPENDENT_AMBULATORY_CARE_PROVIDER_SITE_OTHER): Payer: PPO | Admitting: *Deleted

## 2020-03-13 DIAGNOSIS — I639 Cerebral infarction, unspecified: Secondary | ICD-10-CM | POA: Diagnosis not present

## 2020-03-14 NOTE — Telephone Encounter (Signed)
Spoke with patient's wife Andrew Cobb and she advised that Andrew Cobb is receiving a bill due to incorrect billing.  Andrew Cobb states claim in March was incorrectly billed under Dr. Jeananne Rama although he had retired. She also stated that the insurance company says that the benefit for CCM pharmacy calls has been exceeded.  Advised pt's wife that I would investigate and call her back with additional information.

## 2020-03-15 NOTE — Progress Notes (Signed)
Carelink Summary Report / Loop Recorder 

## 2020-03-16 ENCOUNTER — Ambulatory Visit: Payer: Self-pay | Admitting: Pharmacist

## 2020-03-20 ENCOUNTER — Other Ambulatory Visit: Payer: Self-pay

## 2020-03-20 ENCOUNTER — Ambulatory Visit (INDEPENDENT_AMBULATORY_CARE_PROVIDER_SITE_OTHER): Payer: PPO | Admitting: Nurse Practitioner

## 2020-03-20 ENCOUNTER — Encounter: Payer: Self-pay | Admitting: Nurse Practitioner

## 2020-03-20 VITALS — BP 131/68 | HR 60 | Temp 97.5°F | Wt 195.0 lb

## 2020-03-20 DIAGNOSIS — H353211 Exudative age-related macular degeneration, right eye, with active choroidal neovascularization: Secondary | ICD-10-CM

## 2020-03-20 DIAGNOSIS — E1169 Type 2 diabetes mellitus with other specified complication: Secondary | ICD-10-CM

## 2020-03-20 DIAGNOSIS — I10 Essential (primary) hypertension: Secondary | ICD-10-CM

## 2020-03-20 DIAGNOSIS — C61 Malignant neoplasm of prostate: Secondary | ICD-10-CM

## 2020-03-20 DIAGNOSIS — E1165 Type 2 diabetes mellitus with hyperglycemia: Secondary | ICD-10-CM

## 2020-03-20 DIAGNOSIS — Z8673 Personal history of transient ischemic attack (TIA), and cerebral infarction without residual deficits: Secondary | ICD-10-CM

## 2020-03-20 DIAGNOSIS — E1159 Type 2 diabetes mellitus with other circulatory complications: Secondary | ICD-10-CM | POA: Diagnosis not present

## 2020-03-20 DIAGNOSIS — R569 Unspecified convulsions: Secondary | ICD-10-CM | POA: Diagnosis not present

## 2020-03-20 DIAGNOSIS — E538 Deficiency of other specified B group vitamins: Secondary | ICD-10-CM

## 2020-03-20 DIAGNOSIS — Z23 Encounter for immunization: Secondary | ICD-10-CM

## 2020-03-20 DIAGNOSIS — E785 Hyperlipidemia, unspecified: Secondary | ICD-10-CM | POA: Diagnosis not present

## 2020-03-20 LAB — BAYER DCA HB A1C WAIVED: HB A1C (BAYER DCA - WAIVED): 6.7 % (ref <7.0)

## 2020-03-20 MED ORDER — ATORVASTATIN CALCIUM 40 MG PO TABS
40.0000 mg | ORAL_TABLET | Freq: Every day | ORAL | 4 refills | Status: DC
Start: 2020-03-20 — End: 2021-02-21

## 2020-03-20 MED ORDER — TELMISARTAN 80 MG PO TABS
ORAL_TABLET | ORAL | 4 refills | Status: DC
Start: 2020-03-20 — End: 2020-05-17

## 2020-03-20 MED ORDER — VALACYCLOVIR HCL 500 MG PO TABS
500.0000 mg | ORAL_TABLET | Freq: Every day | ORAL | 4 refills | Status: DC
Start: 2020-03-20 — End: 2020-07-05

## 2020-03-20 MED ORDER — AMITRIPTYLINE HCL 25 MG PO TABS
25.0000 mg | ORAL_TABLET | Freq: Every day | ORAL | 4 refills | Status: DC
Start: 2020-03-20 — End: 2021-02-21

## 2020-03-20 NOTE — Assessment & Plan Note (Signed)
Being followed by neurology.  Continue current Keppra dose as prescribed by them and collaboration with neurology, reviewed notes.   

## 2020-03-20 NOTE — Assessment & Plan Note (Addendum)
Chronic, ongoing with BP below neuro goal <130/90.  Continue current medication regimen and adjust as needed.  Recommend he monitor BP at home daily and document.  Continue focus on DASH diet and regular exercise at home.  BMP and TSH today.  Return in 3 months.

## 2020-03-20 NOTE — Chronic Care Management (AMB) (Signed)
Chronic Care Management   Follow Up Note   03/20/2020 Name: Andrew Cobb MRN: [id number] DOB: Nov 28, 1950  Referred by: [name], NP Reason for referral : Chronic Care Management ((follow-up DM, HLD))   Andrew Cobb is a 69 y.o. year old male who is a primary care patient of [name], Andrew Faster, NP. The CCM team was consulted for assistance with chronic disease management and care coordination needs.    Review of patient status, including review of consultants reports, relevant laboratory and other test results, and collaboration with appropriate care team members and the patient's provider was performed as part of comprehensive patient evaluation and provision of chronic care management services.    SDOH (Social Determinants of Health) assessments performed: No See Care Plan activities for detailed interventions related to [facility])     Outpatient Encounter Medications as of 03/16/2020  Medication Sig  . aspirin EC 81 MG EC tablet Take 1 tablet (81 mg total) by mouth daily.  . empagliflozin (JARDIANCE) 10 MG TABS tablet Take 10 mg by mouth daily before breakfast.  . Lancets (ONETOUCH ULTRASOFT) lancets 1 each by Other route 2 (two) times daily. Dx E11.9  . levETIRAcetam (KEPPRA) 500 MG tablet Take 1 tablet (500 mg total) by mouth 2 (two) times daily.  . metFORMIN (GLUCOPHAGE) 500 MG tablet Take 2 tablets (1,000 mg total) by mouth 2 (two) times daily with a meal.  . Multiple Vitamins-Minerals (MULTIVITAMIN PO) Take 1 tablet by mouth daily.   [name] ULTRA test strip USE TWICE DAILY  . tamsulosin (FLOMAX) 0.4 MG CAPS capsule Take 1 capsule (0.4 mg total) by mouth daily after supper.  . [DISCONTINUED] amitriptyline (ELAVIL) 25 MG tablet Take 1 tablet (25 mg total) by mouth at bedtime.  . [DISCONTINUED] atorvastatin (LIPITOR) 40 MG tablet Take 1 tablet (40 mg total) by mouth at bedtime.  . [DISCONTINUED] telmisartan (MICARDIS) 80 MG tablet Take 1 tablet by mouth once daily  .  cetirizine (ZYRTEC) 10 MG tablet Take 10 mg by mouth daily.  . Cholecalciferol (VITAMIN D) 2000 units CAPS Take 2,000 Units by mouth daily.  . [DISCONTINUED] valACYclovir (VALTREX) 500 MG tablet Take 1 tablet (500 mg total) by mouth daily.   No facility-administered encounter medications on file as of 03/16/2020.     Objective:   Goals Addressed              This Visit's Progress   .  PharmD " I want to work on my diabetes" (pt-stated)        Current Barriers:  . Diabetes: controlled, most recent A1c 6.3% ; goal <6.5% per neurology; complicated by hx CVA, possible seizure activity, prostate cancer o Patient completed last radiation treatment for prostate CA on 7/29. Appt with Dr. Eulas Post,  Oncology 9/2- doing well, low SE profile folow up PSA and appt in 3 months o Oxygen therapy has been discontinued  Switch from ACEi to ARB in April benefited breathing and cough . Current antihyperglycemic regimen: metformin 1000 mg BID, Jardiance 10 mg daily  o APPROVED for Jardiance assistance through 07/06/20 . Current glucose readings:  o Fasting 90-110s, not checking post prandials . Current exercise regimen:  o He is currently going to the [facility] 5 days per week. He enjoys the water aerobics classes. Reports he feels really good. . 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 Seizure activity: levatiracetam 500 mg BID. Outpatient EEG normal. Neurology will re-evaluate need for this therapy moving forward if repeat EEG normal. Follow up scheduled in November.  Pharmacist Clinical Goal(s):  [name] Kitchen Over the next 90 days, patient with work with PharmD and primary care provider to address optimized  hyperglycemic management  Interventions: . Comprehensive medication review performed, medication list updated in electronic medical record . Inter-disciplinary care team collaboration (see longitudinal plan of care) . Reviewed goal A1c, goal fasting, and goal 2 hour post prandial glucose. Praised patient for attaining these goals and establishing exercise regimen.  Patient Self Care Activities:  . Patient will check blood glucose daily, document, and provide at future appointments . Patient will take medications as prescribed . Patient will check BP daily, document, and provide at future appointments. . Patient will report any questions or concerns to provider   Please see past updates related to this goal by clicking on the "Past Updates" button in the selected goal          Plan:   The care management team will reach out to the patient again over the next 90 days.  Will complete BI PAP renewal for Jardiance.  [name]. Kenton Kingfisher PharmD, [facility] [phone]

## 2020-03-20 NOTE — Assessment & Plan Note (Signed)
Continue collaboration with neurology due to significant stroke history.  Continue current medication regimen. 

## 2020-03-20 NOTE — Assessment & Plan Note (Addendum)
Chronic, ongoing.  Continue ASA and maintaining tight lipid control, may need to increase Atorvastatin to 80 MG if ever elevation, recent LDL in 40's.  Continue current Atorvastatin dosing.  Lipid panel today.

## 2020-03-20 NOTE — Progress Notes (Addendum)
BP 131/68   Pulse 60 Comment: apical  Temp (!) 97.5 F (36.4 C) (Oral)   Wt 195 lb (88.5 kg)   SpO2 95%   BMI 26.45 kg/m    Subjective:    Patient ID: Andrew Cobb, male    DOB: 1951/02/06, 69 y.o.   MRN: [id number]  HPI: Andrew Cobb is a 69 y.o. male  Chief Complaint  Patient presents with  . Diabetes  . Hypertension  . Hyperlipidemia  . Surgical clearance    left shoulder    DIABETES Last A1C inJune 6.3%, which is down from previous 9.2%. He continues on Metformin 1000 MG BID and Jardiance 10 MG.  Has history of bariatric surgery 8 years ago.  Followed by ophthalmology for macular degeneration. Hypoglycemic episodes:no Polydipsia/polyuria:no Visual disturbance:no Chest pain:no Paresthesias:no Glucose Monitoring:yes Accucheck frequency: Daily Fasting glucose:95 to 100 range Post prandial: Evening: Before meals: Taking Insulin?:no Long acting insulin: Short acting insulin: Blood Pressure Monitoring:daily Retinal Examination:Up to Date Foot Exam:Up to Date Pneumovax:Up To Date Influenza:Up to Date Aspirin:yes  HYPERTENSION / HYPERLIPIDEMIA Continues Telmisartan 80 MG and Atorvastatin 40 MG daily. Followed by neurology due to recent stroke events x 3. Currently has loop recorder in place. 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 Previously saw Dr. Josefa Cobb, has not been followed in 7-8 years by him. Satisfied with current treatment?yes Duration of hypertension:chronic BP monitoring frequency:daily BP range:110 -120/70-80 BP medication side effects:no Duration of hyperlipidemia:chronic Cholesterol medication side effects:no Cholesterol supplements: none Medication compliance:good compliance Aspirin:yes Recent stressors:no Recurrent headaches:no Visual changes:no Palpitations:no Dyspnea:yes Chest pain:no Lower extremity  edema:no Dizzy/lightheaded:no The ASCVD Risk score Andrew Cobb DC Jr., et al., 2013) failed to calculate for the following reasons:   The patient has a prior MI or stroke diagnosis  HISTORY OF PROSTATE CA: Previously followed by Dr. Bernardo Cobb. Last seen 11/24/2019 and went through IMRT for 40 days, reports he is tolerated this well.  Last treatment was on 02/02/2020.    SEIZURES: Seen by neurology on 11/29/2019 and recommendation to continue Keppra 500 MG BID + ASA and Atorvastatin.  No recent seizure activity. Reports he is walking 10 miles a day and going to [facility], states the only thing that frustrates him is he can not drive for at least 6 months.  Continues to have loop recorder in place.  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  At this time is to continue PRN Ventolin and Zyrtec daily. Asthma status: controlled Satisfied with current treatment?: yes Albuterol/rescue inhaler frequency: none Dyspnea frequency: none Wheezing frequency: none Cough frequency: none Nocturnal symptom frequency: none Limitation of activity: no Current upper respiratory symptoms: no Triggers: environmental Home peak flows: none Last Spirometry: with pulmonary August 2021 Failed/intolerant to following asthma meds: none Asthma meds in past: none Aerochamber/spacer use: no Visits to ER or Urgent Care in past year: no Pneumovax: Up To Date Influenza: Up to Date   Relevant past medical, surgical, family and social history reviewed and updated as indicated. Interim medical history since our last visit reviewed. Allergies and medications reviewed and updated.  Review of Systems  Constitutional: Negative for activity change, diaphoresis, fatigue and fever.  Respiratory: Negative for cough, chest tightness, shortness of breath and wheezing.   Cardiovascular: Negative for chest pain, palpitations and leg swelling.  Gastrointestinal: Negative.    Endocrine: Negative for polydipsia, polyphagia and polyuria.  Neurological: Negative.   Psychiatric/Behavioral: Negative.     Per HPI unless specifically  indicated above     Objective:    BP 131/68   Pulse 60 Comment: apical  Temp (!) 97.5 F (36.4 C) (Oral)   Wt 195 lb (88.5 kg)   SpO2 95%   BMI 26.45 kg/m   Wt Readings from Last 3 Encounters:  03/20/20 195 lb (88.5 kg)  03/07/20 197 lb (89.4 kg)  02/17/20 198 lb (89.8 kg)    Physical Exam Vitals and nursing note reviewed.  Constitutional:      General: He is awake. He is not in acute distress.    Appearance: He is well-developed and well-groomed. He is not ill-appearing.  HENT:     Head: Normocephalic and atraumatic.     Right Ear: Hearing normal. No drainage.     Left Ear: Hearing normal. No drainage.     Nose:     Comments: Mask in place. Eyes:     General: Lids are normal.        Right eye: No discharge.        Left eye: No discharge.     Conjunctiva/sclera: Conjunctivae normal.     Pupils: Pupils are equal, round, and reactive to light.  Neck:     Thyroid: No thyromegaly.     Vascular: No carotid bruit or JVD.     Trachea: Trachea normal.  Cardiovascular:     Rate and Rhythm: Normal rate and regular rhythm.     Heart sounds: Normal heart sounds, S1 normal and S2 normal. No murmur heard.  No gallop.   Pulmonary:     Effort: Pulmonary effort is normal. No accessory muscle usage or respiratory distress.     Breath sounds: Normal breath sounds.  Abdominal:     General: Bowel sounds are normal.     Palpations: Abdomen is soft. There is no hepatomegaly or splenomegaly.  Musculoskeletal:        General: Normal range of motion.     Cervical back: Normal range of motion and neck supple.     Right lower leg: No edema.     Left lower leg: No edema.  Skin:    General: Skin is warm and dry.     Capillary Refill: Capillary refill takes less than 2 seconds.  Neurological:     Mental Status: He is alert and  oriented to person, place, and time.     Deep Tendon Reflexes: Reflexes are normal and symmetric.  Psychiatric:        Attention and Perception: Attention normal.        Mood and Affect: Mood normal.        Speech: Speech normal.        Behavior: Behavior normal. Behavior is cooperative.        Thought Content: Thought content normal.    Results for orders placed or performed in visit on 03/13/20  CUP PACEART REMOTE DEVICE CHECK  Result Value Ref Range   Date Time Interrogation Session [id number]    Pulse Generator Manufacturer MERM    Pulse Gen Model [id number] Reveal LINQ    Pulse Gen Serial Number [id number] [facility]    Implantable Pulse Generator Type ICM/ILR    Implantable Pulse Generator Implant Date [id number]       Assessment & Plan:   Problem List Items Addressed This Visit      Cardiovascular and Mediastinum   Hypertension associated with diabetes ([place])    Chronic, ongoing with BP below neuro goal <130/90.  Continue  current medication regimen and adjust as needed.  Recommend he monitor BP at home daily and document.  Continue focus on DASH diet and regular exercise at home.  BMP and TSH today.  Return in 3 months.      Relevant Medications   telmisartan (MICARDIS) 80 MG tablet   atorvastatin (LIPITOR) 40 MG tablet   Other Relevant Orders   Basic metabolic panel   Bayer DCA Hb A1c Waived   TSH   Exudative age-related macular degeneration of right eye with active choroidal neovascularization ([place])    Continue collaboration with ophthalmology.       Relevant Medications   telmisartan (MICARDIS) 80 MG tablet   atorvastatin (LIPITOR) 40 MG tablet     Endocrine   Uncontrolled type 2 diabetes mellitus with hyperglycemia, without long-term current use of insulin (HCC) - Primary    Chronic, ongoing with A1C today 6.7% (slightly above goal for neuro),  plus urine ALB 10 and A:C <30 last visit.  Sugars are trending down at home.  Neuro goal <6.5%.   Continue  Metformin 1000 MG BID and continue Jardiance 10 MG, will increase Jardiance if A1C elevation noted next visit.  Recommend he check BS twice daily + continue focus on regular exercise and diet.  Return in 3 months for A1C check.      Relevant Medications   telmisartan (MICARDIS) 80 MG tablet   atorvastatin (LIPITOR) 40 MG tablet   Other Relevant Orders   Bayer DCA Hb A1c Waived   Hyperlipidemia associated with type 2 diabetes mellitus (HCC)    Chronic, ongoing.  Continue ASA and maintaining tight lipid control, may need to increase Atorvastatin to 80 MG if ever elevation, recent LDL in 40's.  Continue current Atorvastatin dosing.  Lipid panel today.      Relevant Medications   telmisartan (MICARDIS) 80 MG tablet   atorvastatin (LIPITOR) 40 MG tablet   Other Relevant Orders   Bayer DCA Hb A1c Waived   Lipid Panel w/o Chol/HDL Ratio     Genitourinary   Prostate cancer [facility])    Currently undergoing IMRT, continue collaboration with oncology and urology.  Recent notes reviewed.      Relevant Medications   valACYclovir (VALTREX) 500 MG tablet     Other   History of stroke    Continue collaboration with neurology due to significant stroke history.  Continue current medication regimen.      Seizures ([place])    Being followed by neurology.  Continue current Keppra dose as prescribed by them and collaboration with neurology, reviewed notes.       Other Visit Diagnoses    B12 deficiency       Reports history of low levels and is on Metformin, will check today and start supplement as needed.   Relevant Orders   Vitamin B12   Need for pneumococcal vaccine       Relevant Orders   Pneumococcal conjugate vaccine 13-valent IM (Completed)   Flu vaccine need       Relevant Orders   Flu Vaccine QUAD High Dose(Fluad) (Completed)       Follow up plan: Return in about 3 months (around 06/19/2020) for T2DM, HTN/HLD, Seizures, Asthma.

## 2020-03-20 NOTE — Assessment & Plan Note (Signed)
Currently undergoing IMRT, continue collaboration with oncology and urology.  Recent notes reviewed.

## 2020-03-20 NOTE — Assessment & Plan Note (Signed)
Continue collaboration with ophthalmology. 

## 2020-03-20 NOTE — Patient Instructions (Signed)

## 2020-03-20 NOTE — Assessment & Plan Note (Signed)
Chronic, ongoing with A1C today 6.7% (slightly above goal for neuro),  plus urine ALB 10 and A:C <30 last visit.  Sugars are trending down at home.  Neuro goal <6.5%.  Continue  Metformin 1000 MG BID and continue Jardiance 10 MG, will increase Jardiance if A1C elevation noted next visit.  Recommend he check BS twice daily + continue focus on regular exercise and diet.  Return in 3 months for A1C check.

## 2020-03-20 NOTE — Patient Instructions (Addendum)
Visit Information  It was a pleasure speaking with you today. Thank you for letting me be part of your clinical team. Please call with any questions or concerns.   Goals Addressed              This Visit's Progress   .  PharmD " I want to work on my diabetes" (pt-stated)        Current Barriers:  . Diabetes: controlled, most recent A1c 6.3% ; goal <6.5% per neurology; complicated by hx CVA, possible seizure activity, prostate cancer o Patient completed last radiation treatment for prostate CA on 7/29. Appt with Dr. Eulas Post,  Oncology 9/2- doing well, low SE profile folow up PSA and appt in 3 months o Oxygen therapy has been discontinued  Switch from ACEi to ARB in April benefited breathing and cough . Current antihyperglycemic regimen: metformin 1000 mg BID, Jardiance 10 mg daily  o APPROVED for Jardiance assistance through 07/06/20 . Current glucose readings:  o Fasting 90-110s, not checking post prandials . Current exercise regimen:  o He is currently going to the [facility] 5 days per week. He enjoys the water aerobics classes. Reports he feels really good. . 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 Seizure activity: levatiracetam 500 mg BID. Outpatient EEG normal. Neurology will re-evaluate need for this therapy moving forward if repeat EEG normal. Follow up scheduled in November.  Pharmacist Clinical Goal(s):  [name] Kitchen Over the next 90 days, patient with work with PharmD and primary care provider to address optimized hyperglycemic management  Interventions: . Comprehensive medication review performed, medication list updated in electronic medical record . Inter-disciplinary care team  collaboration (see longitudinal plan of care) . Reviewed goal A1c, goal fasting, and goal 2 hour post prandial glucose. Praised patient for attaining these goals and establishing exercise regimen.  Patient Self Care Activities:  . Patient will check blood glucose daily, document, and provide at future appointments . Patient will take medications as prescribed . Patient will check BP daily, document, and provide at future appointments. . Patient will report any questions or concerns to provider   Please see past updates related to this goal by clicking on the "Past Updates" button in the selected goal         The patient verbalized understanding of instructions provided today and agreed to receive a mailed copy of patient instruction and/or educational materials.  Telephone follow up appointment with pharmacy team member scheduled for: 09/05/20 at 10:30 AM  [name]. Kenton Kingfisher PharmD, BCPS Clinical Pharmacist [phone]  Mindfulness-Based Stress Reduction Mindfulness-based stress reduction (MBSR) is a program that helps people learn to practice mindfulness. Mindfulness is the practice of intentionally paying attention to the present moment. It can be learned and practiced through techniques such as education, breathing exercises, meditation, and yoga. MBSR includes several mindfulness techniques in one program. MBSR works best when you understand the treatment, are willing to try new things, and can commit to spending time practicing what you learn. MBSR training may include learning about:  How your emotions, thoughts, and reactions affect your body.  New ways to respond to things that cause negative thoughts to start (triggers).  How to notice your thoughts and let go of them.  Practicing  awareness of everyday things that you normally do without thinking.  The techniques and goals of different types of meditation. What are the benefits of MBSR? MBSR can have many benefits, which  include helping you to:  Develop self-awareness. This refers to knowing and understanding yourself.  Learn skills and attitudes that help you to participate in your own health care.  Learn new ways to care for yourself.  Be more accepting about how things are, and let things go.  Be less judgmental and approach things with an open mind.  Be patient with yourself and trust yourself more. MBSR has also been shown to:  Reduce negative emotions, such as depression and anxiety.  Improve memory and focus.  Change how you sense and approach pain.  Boost your body's ability to fight infections.  Help you connect better with other people.  Improve your sense of well-being. Follow these instructions at home:   Find a local in-person or online MBSR program.  Set aside some time regularly for mindfulness practice.  Find a mindfulness practice that works best for you. This may include one or more of the following: ? Meditation. Meditation involves focusing your mind on a certain thought or activity. ? Breathing awareness exercises. These help you to stay present by focusing on your breath. ? Body scan. For this practice, you lie down and pay attention to each part of your body from head to toe. You can identify tension and soreness and intentionally relax parts of your body. ? Yoga. Yoga involves stretching and breathing, and it can improve your ability to move and be flexible. It can also provide an experience of testing your body's limits, which can help you release stress. ? Mindful eating. This way of eating involves focusing on the taste, texture, color, and smell of each bite of food. Because this slows down eating and helps you feel full sooner, it can be an important part of a weight-loss plan.  Find a podcast or recording that provides guidance for breathing awareness, body scan, or meditation exercises. You can listen to these any time when you have a free moment to rest without  distractions.  Follow your treatment plan as told by your health care provider. This may include taking regular medicines and making changes to your diet or lifestyle as recommended. How to practice mindfulness To do a basic awareness exercise:  Find a comfortable place to sit.  Pay attention to the present moment. Observe your thoughts, feelings, and surroundings just as they are.  Avoid placing judgment on yourself, your feelings, or your surroundings. Make note of any judgment that comes up, and let it go.  Your mind may wander, and that is okay. Make note of when your thoughts drift, and return your attention to the present moment. To do basic mindfulness meditation:  Find a comfortable place to sit. This may include a stable chair or a firm floor cushion. ? Sit upright with your back straight. Let your arms fall next to your side with your hands resting on your legs. ? If sitting in a chair, rest your feet flat on the floor. ? If sitting on a cushion, cross your legs in front of you.  Keep your head in a neutral position with your chin dropped slightly. Relax your jaw and rest the tip of your tongue on the roof of your mouth. Drop your gaze to the floor. You can close your eyes if you like.  Breathe normally and pay attention to  your breath. Feel the air moving in and out of your nose. Feel your belly expanding and relaxing with each breath.  Your mind may wander, and that is okay. Make note of when your thoughts drift, and return your attention to your breath.  Avoid placing judgment on yourself, your feelings, or your surroundings. Make note of any judgment or feelings that come up, let them go, and bring your attention back to your breath.  When you are ready, lift your gaze or open your eyes. Pay attention to how your body feels after the meditation. Where to find more information You can find more information about MBSR from:  Your health care provider.  Community-based  meditation centers or programs.  Programs offered near you. Summary  Mindfulness-based stress reduction (MBSR) is a program that teaches you how to intentionally pay attention to the present moment. It is used with other treatments to help you cope better with daily stress, emotions, and pain.  MBSR focuses on developing self-awareness, which allows you to respond to life stress without judgment or negative emotions.  MBSR programs may involve learning different mindfulness practices, such as breathing exercises, meditation, yoga, body scan, or mindful eating. Find a mindfulness practice that works best for you, and set aside time for it on a regular basis. This information is not intended to replace advice given to you by your health care provider. Make sure you discuss any questions you have with your health care provider. Document Revised: 06/05/2017 Document Reviewed: 10/30/2016 Elsevier Patient Education  [place] DASH stands for "Dietary Approaches to Stop Hypertension." The DASH eating plan is a healthy eating plan that has been shown to reduce high blood pressure (hypertension). It may also reduce your risk for type 2 diabetes, heart disease, and stroke. The DASH eating plan may also help with weight loss. What are tips for following this plan?  General guidelines  Avoid eating more than 2,300 mg (milligrams) of salt (sodium) a day. If you have hypertension, you may need to reduce your sodium intake to 1,500 mg a day.  Limit alcohol intake to no more than 1 drink a day for nonpregnant women and 2 drinks a day for men. One drink equals 12 oz of beer, 5 oz of wine, or 1 oz of hard liquor.  Work with your health care provider to maintain a healthy body weight or to lose weight. Ask what an ideal weight is for you.  Get at least 30 minutes of exercise that causes your heart to beat faster (aerobic exercise) most days of the week. Activities may include  walking, swimming, or biking.  Work with your health care provider or diet and nutrition specialist (dietitian) to adjust your eating plan to your individual calorie needs. Reading food labels   Check food labels for the amount of sodium per serving. Choose foods with less than 5 percent of the Daily Value of sodium. Generally, foods with less than 300 mg of sodium per serving fit into this eating plan.  To find whole grains, look for the word "whole" as the first word in the ingredient list. Shopping  Buy products labeled as "low-sodium" or "no salt added."  Buy fresh foods. Avoid canned foods and premade or frozen meals. Cooking  Avoid adding salt when cooking. Use salt-free seasonings or herbs instead of table salt or sea salt. Check with your health care provider or pharmacist before using salt substitutes.  Do not fry foods. Lacinda Axon  foods using healthy methods such as baking, boiling, grilling, and broiling instead.  Cook with heart-healthy oils, such as olive, canola, soybean, or sunflower oil. Meal planning  Eat a balanced diet that includes: ? 5 or more servings of fruits and vegetables each day. At each meal, try to fill half of your plate with fruits and vegetables. ? Up to 6-8 servings of whole grains each day. ? Less than 6 oz of lean meat, poultry, or fish each day. A 3-oz serving of meat is about the same size as a deck of cards. One egg equals 1 oz. ? 2 servings of low-fat dairy each day. ? A serving of nuts, seeds, or beans 5 times each week. ? Heart-healthy fats. Healthy fats called Omega-3 fatty acids are found in foods such as flaxseeds and coldwater fish, like sardines, salmon, and mackerel.  Limit how much you eat of the following: ? Canned or prepackaged foods. ? Food that is high in trans fat, such as fried foods. ? Food that is high in saturated fat, such as fatty meat. ? Sweets, desserts, sugary drinks, and other foods with added sugar. ? Full-fat dairy  products.  Do not salt foods before eating.  Try to eat at least 2 vegetarian meals each week.  Eat more home-cooked food and less restaurant, buffet, and fast food.  When eating at a restaurant, ask that your food be prepared with less salt or no salt, if possible. What foods are recommended? The items listed may not be a complete list. Talk with your dietitian about what dietary choices are best for you. Grains Whole-grain or whole-wheat bread. Whole-grain or whole-wheat pasta. Brown rice. Modena Morrow. Bulgur. Whole-grain and low-sodium cereals. Pita bread. Low-fat, low-sodium crackers. Whole-wheat flour tortillas. Vegetables Fresh or frozen vegetables (raw, steamed, roasted, or grilled). Low-sodium or reduced-sodium tomato and vegetable juice. Low-sodium or reduced-sodium tomato sauce and tomato paste. Low-sodium or reduced-sodium canned vegetables. Fruits All fresh, dried, or frozen fruit. Canned fruit in natural juice (without added sugar). Meat and other protein foods Skinless chicken or [place]. Ground chicken or [place]. Pork with fat trimmed off. Fish and seafood. Egg whites. Dried beans, peas, or lentils. Unsalted nuts, nut butters, and seeds. Unsalted canned beans. Lean cuts of beef with fat trimmed off. Low-sodium, lean deli meat. Dairy Low-fat (1%) or fat-free (skim) milk. Fat-free, low-fat, or reduced-fat cheeses. Nonfat, low-sodium ricotta or cottage cheese. Low-fat or nonfat yogurt. Low-fat, low-sodium cheese. Fats and oils Soft margarine without trans fats. Vegetable oil. Low-fat, reduced-fat, or light mayonnaise and salad dressings (reduced-sodium). Canola, safflower, olive, soybean, and sunflower oils. Avocado. Seasoning and other foods Herbs. Spices. Seasoning mixes without salt. Unsalted popcorn and pretzels. Fat-free sweets. What foods are not recommended? The items listed may not be a complete list. Talk with your dietitian about what dietary choices are best for  you. Grains Baked goods made with fat, such as croissants, muffins, or some breads. Dry pasta or rice meal packs. Vegetables Creamed or fried vegetables. Vegetables in a cheese sauce. Regular canned vegetables (not low-sodium or reduced-sodium). Regular canned tomato sauce and paste (not low-sodium or reduced-sodium). Regular tomato and vegetable juice (not low-sodium or reduced-sodium). Angie Fava. Olives. Fruits Canned fruit in a light or heavy syrup. Fried fruit. Fruit in cream or butter sauce. Meat and other protein foods Fatty cuts of meat. Ribs. Fried meat. Berniece Salines. Sausage. Bologna and other processed lunch meats. Salami. Fatback. Hotdogs. Bratwurst. Salted nuts and seeds. Canned beans with added salt. Canned or smoked  fish. Whole eggs or egg yolks. Chicken or [place] with skin. Dairy Whole or 2% milk, cream, and half-and-half. Whole or full-fat cream cheese. Whole-fat or sweetened yogurt. Full-fat cheese. Nondairy creamers. Whipped toppings. Processed cheese and cheese spreads. Fats and oils Butter. Stick margarine. Lard. Shortening. Ghee. Bacon fat. Tropical oils, such as coconut, palm kernel, or palm oil. Seasoning and other foods Salted popcorn and pretzels. Onion salt, garlic salt, seasoned salt, table salt, and sea salt. Worcestershire sauce. Tartar sauce. Barbecue sauce. Teriyaki sauce. Soy sauce, including reduced-sodium. Steak sauce. Canned and packaged gravies. Fish sauce. Oyster sauce. Cocktail sauce. Horseradish that you find on the shelf. Ketchup. Mustard. Meat flavorings and tenderizers. Bouillon cubes. Hot sauce and Tabasco sauce. Premade or packaged marinades. Premade or packaged taco seasonings. Relishes. Regular salad dressings. Where to find more information:  National Heart, Lung, and [place]: https://wilson-eaton.com/  American Heart Association: www.heart.org Summary  The DASH eating plan is a healthy eating plan that has been shown to reduce high blood pressure  (hypertension). It may also reduce your risk for type 2 diabetes, heart disease, and stroke.  With the DASH eating plan, you should limit salt (sodium) intake to 2,300 mg a day. If you have hypertension, you may need to reduce your sodium intake to 1,500 mg a day.  When on the DASH eating plan, aim to eat more fresh fruits and vegetables, whole grains, lean proteins, low-fat dairy, and heart-healthy fats.  Work with your health care provider or diet and nutrition specialist (dietitian) to adjust your eating plan to your individual calorie needs. This information is not intended to replace advice given to you by your health care provider. Make sure you discuss any questions you have with your health care provider. Document Revised: 06/05/2017 Document Reviewed: 06/16/2016 Elsevier Patient Education  2020 [employer].

## 2020-03-21 LAB — BASIC METABOLIC PANEL
BUN/Creatinine Ratio: 27 — ABNORMAL HIGH (ref 10–24)
BUN: 29 mg/dL — ABNORMAL HIGH (ref 8–27)
CO2: 23 mmol/L (ref 20–29)
Calcium: 9.8 mg/dL (ref 8.6–10.2)
Chloride: 101 mmol/L (ref 96–106)
Creatinine, Ser: 1.06 mg/dL (ref 0.76–1.27)
GFR calc Af Amer: 82 mL/min/{1.73_m2} (ref >59)
GFR calc non Af Amer: 71 mL/min/{1.73_m2} (ref >59)
Glucose: 106 mg/dL — ABNORMAL HIGH (ref 65–99)
Potassium: 4.9 mmol/L (ref 3.5–5.2)
Sodium: 140 mmol/L (ref 134–144)

## 2020-03-21 LAB — LIPID PANEL W/O CHOL/HDL RATIO
Cholesterol, Total: 132 mg/dL (ref 100–199)
HDL: 63 mg/dL (ref >39)
LDL Chol Calc (NIH): 53 mg/dL (ref 0–99)
Triglycerides: 84 mg/dL (ref 0–149)
VLDL Cholesterol Cal: 16 mg/dL (ref 5–40)

## 2020-03-21 LAB — TSH: TSH: 1.92 u[IU]/mL (ref 0.450–4.500)

## 2020-03-21 LAB — VITAMIN B12: Vitamin B-12: 320 pg/mL (ref 232–1245)

## 2020-03-30 ENCOUNTER — Other Ambulatory Visit: Payer: Self-pay

## 2020-03-30 DIAGNOSIS — C61 Malignant neoplasm of prostate: Secondary | ICD-10-CM

## 2020-03-30 MED ORDER — TAMSULOSIN HCL 0.4 MG PO CAPS: 0.4000 mg | ORAL_CAPSULE | Freq: Every day | ORAL | 2 refills | Status: DC

## 2020-03-30 NOTE — Telephone Encounter (Signed)
Patient last seen 03/20/20 and has appointment 07/02/20

## 2020-04-03 ENCOUNTER — Ambulatory Visit (INDEPENDENT_AMBULATORY_CARE_PROVIDER_SITE_OTHER): Payer: PPO | Admitting: Ophthalmology

## 2020-04-03 ENCOUNTER — Telehealth: Payer: Self-pay | Admitting: Nurse Practitioner

## 2020-04-03 ENCOUNTER — Other Ambulatory Visit: Payer: Self-pay

## 2020-04-03 ENCOUNTER — Encounter (INDEPENDENT_AMBULATORY_CARE_PROVIDER_SITE_OTHER): Payer: Self-pay | Admitting: Ophthalmology

## 2020-04-03 ENCOUNTER — Encounter (INDEPENDENT_AMBULATORY_CARE_PROVIDER_SITE_OTHER): Payer: PPO | Admitting: Ophthalmology

## 2020-04-03 DIAGNOSIS — C61 Malignant neoplasm of prostate: Secondary | ICD-10-CM

## 2020-04-03 DIAGNOSIS — H353211 Exudative age-related macular degeneration, right eye, with active choroidal neovascularization: Secondary | ICD-10-CM

## 2020-04-03 DIAGNOSIS — H353122 Nonexudative age-related macular degeneration, left eye, intermediate dry stage: Secondary | ICD-10-CM | POA: Diagnosis not present

## 2020-04-03 MED ORDER — TAMSULOSIN HCL 0.4 MG PO CAPS: 0.4000 mg | ORAL_CAPSULE | Freq: Every day | ORAL | 1 refills | Status: DC

## 2020-04-03 MED ORDER — BEVACIZUMAB CHEMO INJECTION 1.25MG/0.05ML SYRINGE FOR KALEIDOSCOPE
1.2500 mg | INTRAVITREAL | Status: AC | PRN
  Administered 2020-04-03: 1.25 mg via INTRAVITREAL

## 2020-04-03 NOTE — Telephone Encounter (Signed)
[facility] pharmacy calling to get tamsulosin (FLOMAX) 0.4 MG CAPS capsule  switched to a 90 day supply, please send in new Rx  Herbalist [facility]) - [place], [place] Phone:  [phone]  Fax:  [phone]

## 2020-04-03 NOTE — Assessment & Plan Note (Signed)
The nature of age--related macular degeneration was discussed with the patient as well as the distinction between dry and wet types. Checking an Amsler Grid daily with advice to return immediately should a distortion develop, was given to the patient. The patient 's smoking status now and in the past was determined and advice based on the AREDS study was provided regarding the consumption of antioxidant supplements. AREDS 2 vitamin formulation was recommended. Consumption of dark leafy vegetables and fresh fruits of various colors was recommended. Treatment modalities for wet macular degeneration particularly the use of intravitreal injections of anti-blood vessel growth factors was discussed with the patient. Avastin, Lucentis, and Eylea are the available options. On occasion, therapy includes the use of photodynamic therapy and thermal laser. Stressed to the patient do not rub eyes.  Patient was advised to check Amsler Grid daily and return immediately if changes are noted. Instructions on using the grid were given to the patient. All patient questions were answered. 

## 2020-04-03 NOTE — Assessment & Plan Note (Signed)
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  Forms of therapy reviewed include the use of Anti-VEGF medications injected painlessly into the eye, as well as other possible treatment modalities, including thermal laser therapy. Fellow eye involvement and risks were discussed with the patient. Upon the finding of wet age related macular degeneration, treatment will be offered. The treatment regimen is on a treat as needed basis with the intent to treat if necessary and extend interval of exams when possible. On average 1 out of 6 patients do not need lifetime therapy. However, the risk of recurrent disease is high for a lifetime.  Initially monthly, then periodic, examinations and evaluations will determine whether the next treatment is required on the day of the examination.  OD, less subretinal fluid  1 month follow-up interval post Avastin today examination is weeks

## 2020-04-03 NOTE — Progress Notes (Signed)
04/03/2020     CHIEF COMPLAINT Patient presents for Retina Follow Up   HISTORY OF PRESENT ILLNESS: Andrew Cobb is a 69 y.o. male who presents to the clinic today for:   HPI    Retina Follow Up    Patient presents with  Wet AMD.  In right eye.  This started 5 weeks ago.  Severity is mild.  Duration of 5 weeks.  Since onset it is stable.          Comments    5 Week AMD F/U OD, poss Avastin OD  Pt denies noticeable changes to [place] OU since last visit. Pt denies ocular pain, flashes of light, or floaters OU.  LBS: 108 this AM       Last edited by Rockie Neighbours, [place] on 04/03/2020  9:52 AM. (History)      Referring physician: [name], NP [place],  [place] [address]  HISTORICAL INFORMATION:   Selected notes from the MEDICAL RECORD NUMBER    Lab Results  Component Value Date   HGBA1C 6.7 03/20/2020     CURRENT MEDICATIONS: No current outpatient medications on file. (Ophthalmic Drugs)   No current facility-administered medications for this visit. (Ophthalmic Drugs)   Current Outpatient Medications (Other)  Medication Sig  . amitriptyline (ELAVIL) 25 MG tablet Take 1 tablet (25 mg total) by mouth at bedtime.  [name] Kitchen aspirin EC 81 MG EC tablet Take 1 tablet (81 mg total) by mouth daily.  [name] Kitchen atorvastatin (LIPITOR) 40 MG tablet Take 1 tablet (40 mg total) by mouth at bedtime.  . cetirizine (ZYRTEC) 10 MG tablet Take 10 mg by mouth daily.  . Cholecalciferol (VITAMIN D) 2000 units CAPS Take 2,000 Units by mouth daily.  . empagliflozin (JARDIANCE) 10 MG TABS tablet Take 10 mg by mouth daily before breakfast.  . Lancets (ONETOUCH ULTRASOFT) lancets 1 each by Other route 2 (two) times daily. Dx E11.9  . levETIRAcetam (KEPPRA) 500 MG tablet Take 1 tablet (500 mg total) by mouth 2 (two) times daily.  . metFORMIN (GLUCOPHAGE) 500 MG tablet Take 2 tablets (1,000 mg total) by mouth 2 (two) times daily with a meal.  . Multiple Vitamins-Minerals (MULTIVITAMIN PO) Take  1 tablet by mouth daily.   [name] ULTRA test strip USE TWICE DAILY  . tamsulosin (FLOMAX) 0.4 MG CAPS capsule Take 1 capsule (0.4 mg total) by mouth daily after supper.  . telmisartan (MICARDIS) 80 MG tablet Take 1 tablet by mouth once daily  . valACYclovir (VALTREX) 500 MG tablet Take 1 tablet (500 mg total) by mouth daily.   No current facility-administered medications for this visit. (Other)      REVIEW OF SYSTEMS:    ALLERGIES Allergies  Allergen Reactions  . Succinylsulphathiazole Rash  . Sulfamethoxazole-Trimethoprim Rash  . Tetracyclines & Related Rash    PAST MEDICAL HISTORY Past Medical History:  Diagnosis Date  . Arthritis   . Asthma   . Cancer [facility])    prostate  . Chronic venous insufficiency    varicose vein lower extremity with inflammation  . Coronary artery disease 1996   two stents placed   . Diabetes mellitus without complication ([place])    type 2 on metformin  . GERD (gastroesophageal reflux disease)    no issues since gastric bypass surgery as stated per pt  . Hyperlipidemia   . Hypogonadism in male   . MRSA (methicillin resistant Staphylococcus aureus) infection    07/30/2008 thru 08/07/2008  .  Sleep apnea    on BIPAP  . Stented coronary artery   . Stroke ([place])   . Thrombocythemia [facility])    Past Surgical History:  Procedure Laterality Date  . ANGIOPLASTY    . ANGIOPLASTY     with stent 04/07/1995  . ANTERIOR CERVICAL DECOMPRESSION/DISCECTOMY FUSION 4 LEVELS N/A 08/17/2017   Procedure: Anterior discectomy with fusion and plate fixation Cervical Three-Four, Four-Five, Five-Six, and Six-Seven Fusion;  Surgeon: Ditty, Kevan Ny, MD;  Location: [place];  Service: Neurosurgery;  Laterality: N/A;  Anterior discectomy with fusion and plate fixation Cervical Three-Four, Four-Five, Five-Six, and Six-Seven Fusion   . APPENDECTOMY     1966  . BUBBLE STUDY  09/20/2018   Procedure: BUBBLE STUDY;  Surgeon: Josue Hector, MD;  Location: [place];   Service: Cardiovascular;;  . [place]  . CARDIOVASCULAR STRESS TEST     07/31/2011  . CARPAL TUNNEL RELEASE Left   . CHOLECYSTECTOMY     2006  . colonscopy      08/25/2012  . EYE SURGERY Bilateral    cataract  . FUNCTIONAL ENDOSCOPIC SINUS SURGERY     11/10/2013  . GASTRIC BYPASS     10/05/2012  . HERNIA REPAIR     left inguinal 1981  . INCISION AND DRAINAGE ABSCESS Right 02/26/2017   Procedure: INCISION AND DRAINAGE ABSCESS;  Surgeon: Nickie Retort, MD;  Location: [facility]S;  Service: Urology;  Laterality: Right;  . JOINT REPLACEMENT     bilateral  . left ankle surgery      05/03/2003   . left carpel tunnel      09/18/1993  . left knee meniscal tear      01/25/2010  . left knee meniscal tear repair      05/04/1996  . left rotator cuff repair      05/03/2003   . LOOP RECORDER INSERTION N/A 09/20/2018   Procedure: LOOP RECORDER INSERTION;  Surgeon: Evans Lance, MD;  Location: [place] CV LAB;  Service: Cardiovascular;  Laterality: N/A;  . REPLACEMENT TOTAL KNEE BILATERAL  07/13/2015  . right ankle surgery      fracture has 2 screws 07/07/1997  . right carpel tunnel      05/16/1992  . right shoulder replacement      01/27/2006  . SCROTAL EXPLORATION Right 02/26/2017   Procedure: SCROTUM EXPLORATION;  Surgeon: Nickie Retort, MD;  Location: [facility];  Service: Urology;  Laterality: Right;  . TEE WITHOUT CARDIOVERSION N/A 09/20/2018   Procedure: TRANSESOPHAGEAL ECHOCARDIOGRAM (TEE);  Surgeon: Josue Hector, MD;  Location: [facility] ENDOSCOPY;  Service: Cardiovascular;  Laterality: N/A;  . TOTAL KNEE ARTHROPLASTY Left 07/13/2015   Procedure: LEFT TOTAL KNEE ARTHROPLASTY;  Surgeon: Gaynelle Arabian, MD;  Location: [facility];  Service: Orthopedics;  Laterality: Left;    FAMILY HISTORY Family History  Problem Relation Age of Onset  . Cancer Mother        pancreatic  . Diabetes Mother   . Stroke Mother   . Heart disease Mother   . Hyperlipidemia Mother   .  Hypertension Mother   . Heart attack Mother   . Heart disease Father   . Stroke Father   . Diabetes Father   . Hypertension Father   . Heart attack Father   . Hyperlipidemia Father   . Pancreatic cancer Father   . Cancer Sister   . Cancer Brother        lung  . Cancer Brother   .  Kidney cancer Neg Hx   . Bladder Cancer Neg Hx   . Prostate cancer Neg Hx     SOCIAL HISTORY Social History   Tobacco Use  . Smoking status: Former Smoker    Packs/day: 1.00    Years: 10.00    Pack years: 10.00    Types: Cigarettes    Quit date: 07/07/1984    Years since quitting: 35.7  . Smokeless tobacco: Never Used  . Tobacco comment: quit 1986  Vaping Use  . Vaping Use: Never used  Substance Use Topics  . Alcohol use: No    Alcohol/week: 0.0 standard drinks  . Drug use: No         OPHTHALMIC EXAM:  Base Eye Exam    Visual Acuity (ETDRS)      Right Left   Dist [place] 20/30 +2 20/20 -2   Dist ph [place] NI        Tonometry (Tonopen, 9:52 AM)      Right Left   Pressure 16 14       Pupils      Pupils Dark Light Shape React APD   Right PERRL 4 3 Round Brisk None   Left PERRL 4 3 Round Brisk None       Visual Fields (Counting fingers)      Left Right    Full Full       Extraocular Movement      Right Left    Full Full       Neuro/Psych    Oriented x3: Yes   Mood/Affect: Normal       Dilation    Right eye: 1.0% Mydriacyl, 2.5% Phenylephrine @ 9:55 AM        Slit Lamp and Fundus Exam    External Exam      Right Left   External Normal Normal       Slit Lamp Exam      Right Left   Lids/Lashes Normal Normal   Conjunctiva/Sclera White and quiet White and quiet   Cornea Clear Clear   Anterior Chamber Deep and quiet Deep and quiet   Iris Round and reactive Round and reactive   Lens Posterior chamber intraocular lens Posterior chamber intraocular lens   Anterior Vitreous Normal Normal       Fundus Exam      Right Left   Posterior Vitreous Posterior vitreous  detachment    Disc Normal    C/D Ratio 0.5    Macula Hard drusen, no exudates, Macular thickening, Soft drusen, Pigmented atrophy    Vessels Normal, , no DR    Periphery Normal           IMAGING AND PROCEDURES  Imaging and Procedures for 04/03/20  OCT, Retina - OU - Both Eyes       Right Eye Quality was good. Scan locations included subfoveal. Central Foveal Thickness: 325. Progression has improved. Findings include abnormal foveal contour, retinal drusen , subretinal fluid.   Left Eye Quality was good. Scan locations included subfoveal. Central Foveal Thickness: 286. Progression has been stable. Findings include abnormal foveal contour, retinal drusen , no IRF, no SRF.   Notes Minor residual subretinal fluid in the right eye remains stable 1 month post injection intravitreal Avastin       Intravitreal Injection, Pharmacologic Agent - OD - Right Eye       Time Out 04/03/2020. 11:17 AM. Confirmed correct patient, procedure, site, and patient consented.   Anesthesia Topical anesthesia was  used. Anesthetic medications included [place] 3.5%.   Procedure Preparation included Tobramycin 0.3%, 10% betadine to eyelids, 5% betadine to ocular surface. A supplied needle was used.   Injection:  1.25 mg Bevacizumab (AVASTIN) SOLN   NDC: [address]-7, Lot: [id number]   Route: Intravitreal, Site: Right Eye, Waste: 0 mg  Post-op Post injection exam found visual acuity of at least counting fingers. The patient tolerated the procedure well. There were no complications. The patient received written and verbal post procedure care education. Post injection medications were not given.                 ASSESSMENT/PLAN:  Exudative age-related macular degeneration of right eye with active choroidal neovascularization (HCC) The nature of wet macular degeneration was discussed with the patient.  Forms of therapy reviewed include the use of Anti-VEGF medications injected painlessly into the  eye, as well as other possible treatment modalities, including thermal laser therapy. Fellow eye involvement and risks were discussed with the patient. Upon the finding of wet age related macular degeneration, treatment will be offered. The treatment regimen is on a treat as needed basis with the intent to treat if necessary and extend interval of exams when possible. On average 1 out of 6 patients do not need lifetime therapy. However, the risk of recurrent disease is high for a lifetime.  Initially monthly, then periodic, examinations and evaluations will determine whether the next treatment is required on the day of the examination.  OD, less subretinal fluid  1 month follow-up interval post Avastin today examination is weeks  Intermediate stage nonexudative age-related macular degeneration of left eye The nature of age--related macular degeneration was discussed with the patient as well as the distinction between dry and wet types. Checking an Amsler Grid daily with advice to return immediately should a distortion develop, was given to the patient. The patient 's smoking status now and in the past was determined and advice based on the AREDS study was provided regarding the consumption of antioxidant supplements. AREDS 2 vitamin formulation was recommended. Consumption of dark leafy vegetables and fresh fruits of various colors was recommended. Treatment modalities for wet macular degeneration particularly the use of intravitreal injections of anti-blood vessel growth factors was discussed with the patient. Avastin, Lucentis, and Eylea are the available options. On occasion, therapy includes the use of photodynamic therapy and thermal laser. Stressed to the patient do not rub eyes.  Patient was advised to check Amsler Grid daily and return immediately if changes are noted. Instructions on using the grid were given to the patient. All patient questions were answered.      ICD-10-CM   1. Exudative  age-related macular degeneration of right eye with active choroidal neovascularization (HCC)  H35.3211 OCT, Retina - OU - Both Eyes    Intravitreal Injection, Pharmacologic Agent - OD - Right Eye    Bevacizumab (AVASTIN) SOLN 1.25 mg  2. Intermediate stage nonexudative age-related macular degeneration of left eye  H35.3122     1.  Wet ARMD OD still with some slight activity, will repeat intravitreal Avastin OD today and examination in 5 weeks  2.  3.  Ophthalmic Meds Ordered this visit:  Meds ordered this encounter  Medications  . Bevacizumab (AVASTIN) SOLN 1.25 mg       Return in about 5 weeks (around 05/08/2020) for dilate, OD, AVASTIN OCT.  There are no Patient Instructions on file for this visit.   Explained the diagnoses, plan, and follow up with the patient and  they expressed understanding.  Patient expressed understanding of the importance of proper follow up care.   [name] Celestina Gironda M.D. Diseases & Surgery of the Retina and Vitreous Retina & Diabetic [place] 04/03/20     Abbreviations: M myopia (nearsighted); A astigmatism; H hyperopia (farsighted); P presbyopia; Mrx spectacle prescription;  CTL contact lenses; OD right eye; OS left eye; OU both eyes  XT exotropia; ET esotropia; PEK punctate epithelial keratitis; PEE punctate epithelial erosions; DES dry eye syndrome; MGD meibomian gland dysfunction; ATs artificial tears; PFAT's preservative free artificial tears; [place] nuclear sclerotic cataract; PSC posterior subcapsular cataract; ERM epi-retinal membrane; PVD posterior vitreous detachment; RD retinal detachment; DM diabetes mellitus; DR diabetic retinopathy; NPDR non-proliferative diabetic retinopathy; PDR proliferative diabetic retinopathy; CSME clinically significant macular edema; DME diabetic macular edema; dbh dot blot hemorrhages; CWS cotton wool spot; POAG primary open angle glaucoma; C/D cup-to-disc ratio; HVF humphrey visual field; GVF goldmann visual field; OCT  optical coherence tomography; IOP intraocular pressure; BRVO Branch retinal vein occlusion; CRVO central retinal vein occlusion; CRAO central retinal artery occlusion; BRAO branch retinal artery occlusion; RT retinal tear; SB scleral buckle; PPV pars plana vitrectomy; VH Vitreous hemorrhage; PRP panretinal laser photocoagulation; IVK intravitreal kenalog; VMT vitreomacular traction; MH Macular hole;  NVD neovascularization of the disc; NVE neovascularization elsewhere; AREDS age related eye disease study; ARMD age related macular degeneration; POAG primary open angle glaucoma; EBMD epithelial/anterior basement membrane dystrophy; ACIOL anterior chamber intraocular lens; IOL intraocular lens; PCIOL posterior chamber intraocular lens; Phaco/IOL phacoemulsification with intraocular lens placement; [place] photorefractive keratectomy; LASIK laser assisted in situ keratomileusis; HTN hypertension; DM diabetes mellitus; COPD chronic obstructive pulmonary disease

## 2020-04-03 NOTE — Telephone Encounter (Signed)
Routing to provider. Patient last seen 03/20/20. Requesting 90 day RX sent to mail order please.

## 2020-04-13 LAB — CUP PACEART REMOTE DEVICE CHECK
Date Time Interrogation Session: 20211005031456
Implantable Pulse Generator Implant Date: 20200316

## 2020-04-16 ENCOUNTER — Ambulatory Visit (INDEPENDENT_AMBULATORY_CARE_PROVIDER_SITE_OTHER): Payer: PPO

## 2020-04-16 DIAGNOSIS — I639 Cerebral infarction, unspecified: Secondary | ICD-10-CM | POA: Diagnosis not present

## 2020-04-18 NOTE — Progress Notes (Signed)
Carelink Summary Report / Loop Recorder 

## 2020-04-24 DIAGNOSIS — L280 Lichen simplex chronicus: Secondary | ICD-10-CM | POA: Diagnosis not present

## 2020-04-24 DIAGNOSIS — L309 Dermatitis, unspecified: Secondary | ICD-10-CM | POA: Diagnosis not present

## 2020-05-07 ENCOUNTER — Ambulatory Visit: Payer: PPO | Admitting: Adult Health

## 2020-05-08 ENCOUNTER — Encounter (INDEPENDENT_AMBULATORY_CARE_PROVIDER_SITE_OTHER): Payer: PPO | Admitting: Ophthalmology

## 2020-05-08 ENCOUNTER — Other Ambulatory Visit: Payer: Self-pay

## 2020-05-08 ENCOUNTER — Encounter (INDEPENDENT_AMBULATORY_CARE_PROVIDER_SITE_OTHER): Payer: Self-pay | Admitting: Ophthalmology

## 2020-05-08 ENCOUNTER — Ambulatory Visit (INDEPENDENT_AMBULATORY_CARE_PROVIDER_SITE_OTHER): Payer: PPO | Admitting: Ophthalmology

## 2020-05-08 DIAGNOSIS — H353211 Exudative age-related macular degeneration, right eye, with active choroidal neovascularization: Secondary | ICD-10-CM | POA: Diagnosis not present

## 2020-05-08 DIAGNOSIS — H35721 Serous detachment of retinal pigment epithelium, right eye: Secondary | ICD-10-CM | POA: Diagnosis not present

## 2020-05-08 MED ORDER — BEVACIZUMAB CHEMO INJECTION 1.25MG/0.05ML SYRINGE FOR KALEIDOSCOPE
1.2500 mg | INTRAVITREAL | Status: AC | PRN
  Administered 2020-05-08: 1.25 mg via INTRAVITREAL

## 2020-05-08 NOTE — Assessment & Plan Note (Signed)
At 5-week interval today, some subretinal fluid has recurrent turned.  We will repeat injection today and again in 5 weeks

## 2020-05-08 NOTE — Progress Notes (Signed)
05/08/2020     CHIEF COMPLAINT Patient presents for Retina Follow Up   HISTORY OF PRESENT ILLNESS: Andrew Cobb is a 69 y.o. male who presents to the clinic today for:   HPI    Retina Follow Up    Patient presents with  Wet AMD.  In right eye.  Severity is moderate.  Duration of 5 weeks.  Since onset it is stable.  I, the attending physician,  performed the HPI with the patient and updated documentation appropriately.          Comments    5 Week Wet AMD f\u OD. Possible Avastin OD. OCT  Pt states no changes in vision. Denies any complaints. BGL: 110 A1C: 6.7       Last edited by [name] on 05/08/2020 10:09 AM. (History)      Referring physician: [name], NP [place],  [place] [address]  HISTORICAL INFORMATION:   Selected notes from the MEDICAL RECORD NUMBER    Lab Results  Component Value Date   HGBA1C 6.7 03/20/2020     CURRENT MEDICATIONS: No current outpatient medications on file. (Ophthalmic Drugs)   No current facility-administered medications for this visit. (Ophthalmic Drugs)   Current Outpatient Medications (Other)  Medication Sig  . amitriptyline (ELAVIL) 25 MG tablet Take 1 tablet (25 mg total) by mouth at bedtime.  [name] Kitchen aspirin EC 81 MG EC tablet Take 1 tablet (81 mg total) by mouth daily.  [name] Kitchen atorvastatin (LIPITOR) 40 MG tablet Take 1 tablet (40 mg total) by mouth at bedtime.  . cetirizine (ZYRTEC) 10 MG tablet Take 10 mg by mouth daily.  . Cholecalciferol (VITAMIN D) 2000 units CAPS Take 2,000 Units by mouth daily.  . empagliflozin (JARDIANCE) 10 MG TABS tablet Take 10 mg by mouth daily before breakfast.  . Lancets (ONETOUCH ULTRASOFT) lancets 1 each by Other route 2 (two) times daily. Dx E11.9  . levETIRAcetam (KEPPRA) 500 MG tablet Take 1 tablet (500 mg total) by mouth 2 (two) times daily.  . metFORMIN (GLUCOPHAGE) 500 MG tablet Take 2 tablets (1,000 mg total) by mouth 2 (two) times daily with a meal.  . Multiple  Vitamins-Minerals (MULTIVITAMIN PO) Take 1 tablet by mouth daily.   [name] ULTRA test strip USE TWICE DAILY  . tamsulosin (FLOMAX) 0.4 MG CAPS capsule Take 1 capsule (0.4 mg total) by mouth daily after supper.  . telmisartan (MICARDIS) 80 MG tablet Take 1 tablet by mouth once daily  . valACYclovir (VALTREX) 500 MG tablet Take 1 tablet (500 mg total) by mouth daily.   No current facility-administered medications for this visit. (Other)      REVIEW OF SYSTEMS: ROS    Positive for: Endocrine   Last edited by [name] on 05/08/2020 10:09 AM. (History)       ALLERGIES Allergies  Allergen Reactions  . Succinylsulphathiazole Rash  . Sulfamethoxazole-Trimethoprim Rash  . Tetracyclines & Related Rash    PAST MEDICAL HISTORY Past Medical History:  Diagnosis Date  . Arthritis   . Asthma   . Cancer [facility])    prostate  . Chronic venous insufficiency    varicose vein lower extremity with inflammation  . Coronary artery disease 1996   two stents placed   . Diabetes mellitus without complication ([place])    type 2 on metformin  . GERD (gastroesophageal reflux disease)    no issues since gastric bypass surgery as stated per pt  . Hyperlipidemia   .  Hypogonadism in male   . MRSA (methicillin resistant Staphylococcus aureus) infection    07/30/2008 thru 08/07/2008  . Sleep apnea    on BIPAP  . Stented coronary artery   . Stroke ([place])   . Thrombocythemia    Past Surgical History:  Procedure Laterality Date  . ANGIOPLASTY    . ANGIOPLASTY     with stent 04/07/1995  . ANTERIOR CERVICAL DECOMPRESSION/DISCECTOMY FUSION 4 LEVELS N/A 08/17/2017   Procedure: Anterior discectomy with fusion and plate fixation Cervical Three-Four, Four-Five, Five-Six, and Six-Seven Fusion;  Surgeon: Ditty, Kevan Ny, MD;  Location: [place];  Service: Neurosurgery;  Laterality: N/A;  Anterior discectomy with fusion and plate fixation Cervical Three-Four, Four-Five, Five-Six, and Six-Seven Fusion     . APPENDECTOMY     1966  . BUBBLE STUDY  09/20/2018   Procedure: BUBBLE STUDY;  Surgeon: Josue Hector, MD;  Location: [facility];  Service: Cardiovascular;;  . [place]  . CARDIOVASCULAR STRESS TEST     07/31/2011  . CARPAL TUNNEL RELEASE Left   . CHOLECYSTECTOMY     2006  . colonscopy      08/25/2012  . EYE SURGERY Bilateral    cataract  . FUNCTIONAL ENDOSCOPIC SINUS SURGERY     11/10/2013  . GASTRIC BYPASS     10/05/2012  . HERNIA REPAIR     left inguinal 1981  . INCISION AND DRAINAGE ABSCESS Right 02/26/2017   Procedure: INCISION AND DRAINAGE ABSCESS;  Surgeon: Nickie Retort, MD;  Location: [facility];  Service: Urology;  Laterality: Right;  . JOINT REPLACEMENT     bilateral  . left ankle surgery      05/03/2003   . left carpel tunnel      09/18/1993  . left knee meniscal tear      01/25/2010  . left knee meniscal tear repair      05/04/1996  . left rotator cuff repair      05/03/2003   . LOOP RECORDER INSERTION N/A 09/20/2018   Procedure: LOOP RECORDER INSERTION;  Surgeon: Evans Lance, MD;  Location: [facility];  Service: Cardiovascular;  Laterality: N/A;  . REPLACEMENT TOTAL KNEE BILATERAL  07/13/2015  . right ankle surgery      fracture has 2 screws 07/07/1997  . right carpel tunnel      05/16/1992  . right shoulder replacement      01/27/2006  . SCROTAL EXPLORATION Right 02/26/2017   Procedure: SCROTUM EXPLORATION;  Surgeon: Nickie Retort, MD;  Location: [facility];  Service: Urology;  Laterality: Right;  . TEE WITHOUT CARDIOVERSION N/A 09/20/2018   Procedure: TRANSESOPHAGEAL ECHOCARDIOGRAM (TEE);  Surgeon: Josue Hector, MD;  Location: [facility];  Service: Cardiovascular;  Laterality: N/A;  . TOTAL KNEE ARTHROPLASTY Left 07/13/2015   Procedure: LEFT TOTAL KNEE ARTHROPLASTY;  Surgeon: Gaynelle Arabian, MD;  Location: [facility];  Service: Orthopedics;  Laterality: Left;    FAMILY HISTORY Family History  Problem Relation Age of  Onset  . Cancer Mother        pancreatic  . Diabetes Mother   . Stroke Mother   . Heart disease Mother   . Hyperlipidemia Mother   . Hypertension Mother   . Heart attack Mother   . Heart disease Father   . Stroke Father   . Diabetes Father   . Hypertension Father   . Heart attack Father   . Hyperlipidemia Father   . Pancreatic cancer Father   . Cancer  Sister   . Cancer Brother        lung  . Cancer Brother   . Kidney cancer Neg Hx   . Bladder Cancer Neg Hx   . Prostate cancer Neg Hx     SOCIAL HISTORY Social History   Tobacco Use  . Smoking status: Former Smoker    Packs/day: 1.00    Years: 10.00    Pack years: 10.00    Types: Cigarettes    Quit date: 07/07/1984    Years since quitting: 35.8  . Smokeless tobacco: Never Used  . Tobacco comment: quit 1986  Vaping Use  . Vaping Use: Never used  Substance Use Topics  . Alcohol use: No    Alcohol/week: 0.0 standard drinks  . Drug use: No         OPHTHALMIC EXAM:  Base Eye Exam    Visual Acuity (Snellen - Linear)      Right Left   Dist [place] 20/30 + 20/20 -2       Tonometry (Tonopen, 10:13 AM)      Right Left   Pressure 13 13       Pupils      Pupils Dark Light Shape React APD   Right PERRL 3 2 Round Brisk None   Left PERRL 3 2 Round Brisk None       Visual Fields (Counting fingers)      Left Right    Full Full       Neuro/Psych    Oriented x3: Yes   Mood/Affect: Normal       Dilation    Right eye: 1.0% Mydriacyl, 2.5% Phenylephrine @ 10:13 AM        Slit Lamp and Fundus Exam    External Exam      Right Left   External Normal Normal       Slit Lamp Exam      Right Left   Lids/Lashes Normal Normal   Conjunctiva/Sclera White and quiet White and quiet   Cornea Clear Clear   Anterior Chamber Deep and quiet Deep and quiet   Iris Round and reactive Round and reactive   Lens Posterior chamber intraocular lens Posterior chamber intraocular lens   Anterior Vitreous Normal Normal        Fundus Exam      Right Left   Posterior Vitreous Posterior vitreous detachment    Disc Normal    C/D Ratio 0.5    Macula Hard drusen, no exudates, Macular thickening, Soft drusen, Pigmented atrophy, Retinal pigment epithelial mottling    Vessels Normal, , no DR    Periphery Normal           IMAGING AND PROCEDURES  Imaging and Procedures for 05/08/20  OCT, Retina - OU - Both Eyes       Right Eye Quality was good. Scan locations included subfoveal. Central Foveal Thickness: 332. Progression has been stable. Findings include subretinal fluid, disciform scar, choroidal neovascular membrane.   Left Eye Quality was good. Scan locations included subfoveal. Central Foveal Thickness: 291. Progression has been stable. Findings include abnormal foveal contour, retinal drusen , no IRF, no SRF.   Notes Overall condition improved from onset of therapy yet some minor subretinal fluid present OD today, will repeat injection today again exam in 5 weeks       Intravitreal Injection, Pharmacologic Agent - OD - Right Eye       Time Out 05/08/2020. 11:27 AM. Confirmed correct patient, procedure, site, and  patient consented.   Anesthesia Topical anesthesia was used. Anesthetic medications included Akten 3.5%.   Procedure Preparation included Tobramycin 0.3%, 10% betadine to eyelids, 5% betadine to ocular surface. A supplied needle was used.   Injection:  1.25 mg Bevacizumab (AVASTIN) SOLN   NDC: 70360-001-02, Lot: [id number]   Route: Intravitreal, Site: Right Eye, Waste: 0 mg  Post-op Post injection exam found visual acuity of at least counting fingers. The patient tolerated the procedure well. There were no complications. The patient received written and verbal post procedure care education. Post injection medications were not given.                 ASSESSMENT/PLAN:  Exudative age-related macular degeneration of right eye with active choroidal neovascularization (HCC) At 5-week  interval today, some subretinal fluid has recurrent turned.  We will repeat injection today and again in 5 weeks  Serous detachment of retinal pigment epithelium of right eye Slight recurrence of the serous component of CNVM disease today at 5 weeks      ICD-10-CM   1. Exudative age-related macular degeneration of right eye with active choroidal neovascularization (HCC)  H35.3211 OCT, Retina - OU - Both Eyes    Intravitreal Injection, Pharmacologic Agent - OD - Right Eye    Bevacizumab (AVASTIN) SOLN 1.25 mg  2. Serous detachment of retinal pigment epithelium of right eye  H35.721     1.  Repeat injection intravitreal Avastin OD today, 5-week interval.  2.  Repeat examination right eye in 5 weeks.  3.  Ophthalmic Meds Ordered this visit:  Meds ordered this encounter  Medications  . Bevacizumab (AVASTIN) SOLN 1.25 mg       Return in about 5 weeks (around 06/12/2020) for dilate, OD, AVASTIN OCT.  Patient Instructions  Patient instructed to contact the office promptly for new onset visual acuity decline or distortion    Explained the diagnoses, plan, and follow up with the patient and they expressed understanding.  Patient expressed understanding of the importance of proper follow up care.   [name] Hector Taft M.D. Diseases & Surgery of the Retina and Vitreous [facility] [place] 05/08/20     Abbreviations: M myopia (nearsighted); A astigmatism; H hyperopia (farsighted); P presbyopia; Mrx spectacle prescription;  CTL contact lenses; OD right eye; OS left eye; OU both eyes  XT exotropia; ET esotropia; PEK punctate epithelial keratitis; PEE punctate epithelial erosions; DES dry eye syndrome; MGD meibomian gland dysfunction; ATs artificial tears; PFAT's preservative free artificial tears; [place] nuclear sclerotic cataract; PSC posterior subcapsular cataract; ERM epi-retinal membrane; PVD posterior vitreous detachment; RD retinal detachment; DM diabetes mellitus; DR diabetic  retinopathy; NPDR non-proliferative diabetic retinopathy; PDR proliferative diabetic retinopathy; CSME clinically significant macular edema; DME diabetic macular edema; dbh dot blot hemorrhages; CWS cotton wool spot; POAG primary open angle glaucoma; C/D cup-to-disc ratio; HVF humphrey visual field; GVF goldmann visual field; OCT optical coherence tomography; IOP intraocular pressure; BRVO Branch retinal vein occlusion; CRVO central retinal vein occlusion; CRAO central retinal artery occlusion; BRAO branch retinal artery occlusion; RT retinal tear; SB scleral buckle; PPV pars plana vitrectomy; VH Vitreous hemorrhage; PRP panretinal laser photocoagulation; IVK intravitreal kenalog; VMT vitreomacular traction; MH Macular hole;  NVD neovascularization of the disc; NVE neovascularization elsewhere; AREDS age related eye disease study; ARMD age related macular degeneration; POAG primary open angle glaucoma; EBMD epithelial/anterior basement membrane dystrophy; ACIOL anterior chamber intraocular lens; IOL intraocular lens; PCIOL posterior chamber intraocular lens; Phaco/IOL phacoemulsification with intraocular lens placement; [place] photorefractive keratectomy;  LASIK laser assisted in situ keratomileusis; HTN hypertension; DM diabetes mellitus; COPD chronic obstructive pulmonary disease

## 2020-05-08 NOTE — Patient Instructions (Signed)
Patient instructed to contact the office promptly for new onset visual acuity decline or distortion 

## 2020-05-08 NOTE — Assessment & Plan Note (Signed)
Slight recurrence of the serous component of CNVM disease today at 5 weeks

## 2020-05-09 ENCOUNTER — Encounter: Payer: Self-pay | Admitting: Adult Health

## 2020-05-09 ENCOUNTER — Ambulatory Visit (INDEPENDENT_AMBULATORY_CARE_PROVIDER_SITE_OTHER): Payer: PPO | Admitting: Adult Health

## 2020-05-09 ENCOUNTER — Telehealth: Payer: Self-pay | Admitting: Pharmacist

## 2020-05-09 VITALS — BP 108/62 | HR 88 | Ht 70.0 in | Wt 198.0 lb

## 2020-05-09 DIAGNOSIS — I639 Cerebral infarction, unspecified: Secondary | ICD-10-CM | POA: Diagnosis not present

## 2020-05-09 DIAGNOSIS — R569 Unspecified convulsions: Secondary | ICD-10-CM

## 2020-05-09 DIAGNOSIS — E119 Type 2 diabetes mellitus without complications: Secondary | ICD-10-CM

## 2020-05-09 DIAGNOSIS — E782 Mixed hyperlipidemia: Secondary | ICD-10-CM | POA: Diagnosis not present

## 2020-05-09 DIAGNOSIS — I10 Essential (primary) hypertension: Secondary | ICD-10-CM | POA: Diagnosis not present

## 2020-05-09 DIAGNOSIS — I69398 Other sequelae of cerebral infarction: Secondary | ICD-10-CM

## 2020-05-09 MED ORDER — LEVETIRACETAM 500 MG PO TABS: 500.0000 mg | ORAL_TABLET | Freq: Two times a day (BID) | ORAL | 3 refills | Status: DC
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
## 2020-05-09 NOTE — Addendum Note (Signed)
Addended by: Mal Misty on: 05/09/2020 11:12 AM   Modules accepted: Orders

## 2020-05-09 NOTE — Progress Notes (Signed)
    Chronic Care Management Pharmacy Assistant   Name: Andrew Cobb  MRN: [id number] DOB: 1950-07-17  Reason for Encounter: Patient Assistance Renewal Application for Jardiance.     PCP : [name], NP  Allergies:   Allergies  Allergen Reactions  . Succinylsulphathiazole Rash  . Sulfamethoxazole-Trimethoprim Rash  . Tetracyclines & Related Rash    Medications: Outpatient Encounter Medications as of 05/09/2020  Medication Sig  . amitriptyline (ELAVIL) 25 MG tablet Take 1 tablet (25 mg total) by mouth at bedtime.  [name] Kitchen aspirin EC 81 MG EC tablet Take 1 tablet (81 mg total) by mouth daily.  [name] Kitchen atorvastatin (LIPITOR) 40 MG tablet Take 1 tablet (40 mg total) by mouth at bedtime.  . cetirizine (ZYRTEC) 10 MG tablet Take 10 mg by mouth daily.  . Cholecalciferol (VITAMIN D) 2000 units CAPS Take 2,000 Units by mouth daily.  . empagliflozin (JARDIANCE) 10 MG TABS tablet Take 10 mg by mouth daily before breakfast.  . Lancets (ONETOUCH ULTRASOFT) lancets 1 each by Other route 2 (two) times daily. Dx E11.9  . levETIRAcetam (KEPPRA) 500 MG tablet Take 1 tablet (500 mg total) by mouth 2 (two) times daily.  . metFORMIN (GLUCOPHAGE) 500 MG tablet Take 2 tablets (1,000 mg total) by mouth 2 (two) times daily with a meal.  . Multiple Vitamins-Minerals (MULTIVITAMIN PO) Take 1 tablet by mouth daily.   [name] ULTRA test strip USE TWICE DAILY  . tamsulosin (FLOMAX) 0.4 MG CAPS capsule Take 1 capsule (0.4 mg total) by mouth daily after supper.  . telmisartan (MICARDIS) 80 MG tablet Take 1 tablet by mouth once daily  . valACYclovir (VALTREX) 500 MG tablet Take 1 tablet (500 mg total) by mouth daily.   No facility-administered encounter medications on file as of 05/09/2020.    Current Diagnosis: Patient Active Problem List   Diagnosis Date Noted  . Intermediate stage nonexudative age-related macular degeneration of left eye 04/03/2020  . Serous detachment of retinal pigment epithelium of  right eye 01/31/2020  . Pseudophakia 01/31/2020  . Exudative age-related macular degeneration of right eye with active choroidal neovascularization ([place]) 01/31/2020  . Situational depression 10/18/2019  . Seizures ([place]) 10/18/2019  . Hyperlipidemia associated with type 2 diabetes mellitus ([place]) 06/19/2019  . Expressive aphasia 06/19/2019  . Vitamin D deficiency 06/19/2019  . Chronic venous insufficiency   . Asthma   . Arthritis   . GERD (gastroesophageal reflux disease)   . Hypogonadism in male   . History of stroke   . H/O right coronary artery stent placement   . Allergic rhinitis 08/19/2018  . Cervical radiculopathy 08/17/2017  . Advanced care planning/counseling discussion 07/21/2017  . Prostate cancer ([place]) 12/08/2016  . H/O bariatric surgery 07/17/2016  . Hypertension associated with diabetes ([place]) 04/24/2015  . Uncontrolled type 2 diabetes mellitus with hyperglycemia, without long-term current use of insulin ([place]) 04/24/2015  . Chronic sinusitis 08/13/2009  . Coronary atherosclerosis 04/02/2009  . OSA (obstructive sleep apnea) 04/02/2009  . Coronary artery disease 1996    Goals Addressed   None    Patient assistance renewal application for Jardiance was inated. All demographic information, prescription and prescriber information was prefilled for patient. Pending completion from patient. Attempted to make patient aware that he will need to stop by PCP office to sign renewal application. No answer. Left HIPAA compliant voicemail requesting a call back.  Application pending at this time due to the need of patient signing form.   Follow-Up:  Patient Assistance Coordination

## 2020-05-09 NOTE — Progress Notes (Signed)
I agree with the above plan 

## 2020-05-09 NOTE — Patient Instructions (Signed)
Continue Keppra 500 mg twice daily for seizure prevention  Continue aspirin 81 mg daily  and atorvastatin for secondary stroke prevention  Loop recorder will continue to be monitored for possible atrial fibrillation  Continue to follow up with PCP regarding cholesterol, blood pressure and diabetes management  Maintain strict control of hypertension with blood pressure goal below 130/90, diabetes with hemoglobin A1c goal below 7.0 % and cholesterol with LDL cholesterol (bad cholesterol) goal below 70 mg/dL.      Followup in the future with me in 1 year or call earlier if needed      Thank you for coming to see [place] at [facility]. I hope we have been able to provide you high quality care today.  You may receive a patient satisfaction survey over the next few weeks. We would appreciate your feedback and comments so that we may continue to improve ourselves and the health of our patients.

## 2020-05-13 LAB — CUP PACEART REMOTE DEVICE CHECK
Date Time Interrogation Session: 20211107023506
Implantable Pulse Generator Implant Date: 20200316

## 2020-05-16 ENCOUNTER — Other Ambulatory Visit: Payer: Self-pay | Admitting: Internal Medicine

## 2020-05-21 ENCOUNTER — Ambulatory Visit (INDEPENDENT_AMBULATORY_CARE_PROVIDER_SITE_OTHER): Payer: PPO

## 2020-05-21 DIAGNOSIS — I639 Cerebral infarction, unspecified: Secondary | ICD-10-CM

## 2020-05-23 ENCOUNTER — Ambulatory Visit: Payer: Self-pay | Admitting: General Practice

## 2020-05-23 ENCOUNTER — Telehealth: Payer: Self-pay | Admitting: General Practice

## 2020-05-23 DIAGNOSIS — E1169 Type 2 diabetes mellitus with other specified complication: Secondary | ICD-10-CM

## 2020-05-23 DIAGNOSIS — C61 Malignant neoplasm of prostate: Secondary | ICD-10-CM

## 2020-05-23 DIAGNOSIS — I152 Hypertension secondary to endocrine disorders: Secondary | ICD-10-CM

## 2020-05-23 DIAGNOSIS — E1165 Type 2 diabetes mellitus with hyperglycemia: Secondary | ICD-10-CM

## 2020-05-23 DIAGNOSIS — E1159 Type 2 diabetes mellitus with other circulatory complications: Secondary | ICD-10-CM

## 2020-05-23 DIAGNOSIS — E785 Hyperlipidemia, unspecified: Secondary | ICD-10-CM

## 2020-05-23 DIAGNOSIS — J4520 Mild intermittent asthma, uncomplicated: Secondary | ICD-10-CM

## 2020-05-23 NOTE — Progress Notes (Signed)
Carelink Summary Report / Loop Recorder 

## 2020-05-23 NOTE — Patient Instructions (Signed)
Visit Information  Goals Addressed              This Visit's Progress     RNCM: "I am feeling better since getting out of the hospital" (pt-stated)        [place] (see longtitudinal plan of care for additional care plan information)  Current Barriers:   Chronic Disease Management support, education, and care coordination needs related to HTN, HLD, DMII, and Pulmonary Disease  Clinical Goal(s) related to HTN, HLD, DMII, and Pulmonary Disease:  Over the next 120 days, patient will:   Work with the care management team to address educational, disease management, and care coordination needs   Begin or continue self health monitoring activities as directed today Measure and record cbg (blood glucose) 2 times daily, Measure and record blood pressure 5 times per week, and adhere to a Heart healthy/ADA diet  Call provider office for new or worsened signs and symptoms Blood glucose findings outside established parameters, Blood pressure findings outside established parameters, Oxygen saturation lower than established parameter, Shortness of breath, and New or worsened symptom related to recent hospitalization due to pneumonia and the need to come home on supplemental oxygen  Call care management team with questions or concerns  Verbalize basic understanding of patient centered plan of care established today  Interventions related to HTN, HLD, DMII, and Pulmonary Disease:   Evaluation of current treatment plans and patient's adherence to plan as established by provider. Patient saw pcp 09/19/2019 post discharge from the hospital. The patient states he is doing much better and is not even having to use the oxygen as much. His oxygen sats are staying 95 to 98%.  Is using the oxygen 2 liters via [place] at night. The patient also verbalized that his blood sugars have come down. States they have been in the 120's to 130's.  Patient verbalized his blood pressure is "great". The patient verbalized  today that he is no longer using oxygen at all and is turning it back in. The patient was at the gym working out. He is doing 5 miles a day at the gym.  His blood sugars are 105-110 and his blood pressure is systolic 599-357 and diastolic 01-77.  Praised for keeping good track of his numbers. Praised for exercise goal being met. The patient verbalized he feels great. 05-23-2020: The patient is doing well. The patient has been busy doing his [employer] job and having a lot of fun. He is booked up the rest of November and December. He states he will have his shoulder surgery as an outpatient on 07-12-2020.  The patient will be glad to get his shoulder done as an outpatient. He said it has been needed for a long time.   Assessed patient understanding of disease states: Patient states he understands his conditions well and is compliant with plan of care and medication regimen   Assessed patient's education and care coordination needs- working with CCM pharmacist. Denies any needs at this time  Provided disease specific education to patient: Education and support given on Heart healthy/ADA diet. The patient states he does not eat salt. 05-23-2020: the patient endorses compliance with a heart healthy/ADA diet. The patient states he has lost about 20 pounds. He states that his blood sugars are 90 to 105 with last hemoglobin A1C of 6.7.  He states that his blood pressures are good at 939-030 systolic and 09-23 diastolic. 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   Collaborated with appropriate clinical care team members regarding patient needs  Evaluation of upcoming appointments. The patient sees the pcp on 07-02-2020 at 0840 am for follow up.  Patient Self Care Activities related to HTN, HLD, DMII, and Pulmonary Disease:   Patient is unable to independently self-manage chronic health conditions  Please see past updates related to this goal by clicking on the "Past  Updates" button in the selected goal        RNCM: Pt-"I have prostate cancer" (pt-stated)        [place] (see longitudinal plan of care for additional care plan information)  Current Barriers:   Knowledge Deficits related to pathophysiology of prostate cancer  Care Coordination needs related to treatment and support in a patient with prostate cancer (disease states)  Chronic Disease Management support and education needs related to prostate cancer  Nurse Case Manager Clinical Goal(s):   Over the next 120 days, patient will verbalize understanding of plan for treatment of prostate cancer  Over the next 120 days, patient will work with [facility], [place] team, and pcp to address needs related to prostate cancer   Over the next 120 days, patient will attend all scheduled medical appointments: the patient has several upcoming appointments for treatment of prostate cancer. The patient is happy that he will be able to take his treatments in [facility]  Over the next 120 days, patient will verbalize basic understanding of prostate disease process and self health management plan as evidenced by remaining independent during treatments and notifying team of changes in condition.   Interventions:   Inter-disciplinary care team collaboration (see longitudinal plan of care)  Evaluation of current treatment plan related to prostate cancer treatment  and patient's adherence to plan as established by provider. 03-07-2020: the patient has finished his radiation treatments about 2 weeks ago. The patient will have blood work and testing in about 3 months to evaluate the effectiveness of the radiation. The patient states he lost about 20 pounds but he is doing well. 05-23-2020: No new developments at this time. Follows up with provider as needed.   Advised patient to call the RNCM or pcp for any needs or changes in condition  Provided education to patient re: taking medications as prescribed,  following recommendations of cancer specialist, and keeping appointments   Discussed plans with patient for ongoing care management follow up and provided patient with direct contact information for care management team  Reviewed scheduled/upcoming provider appointments including: The patient has several upcoming appointments. The patient will have seed placement on 11/24/2019 and will start 40 treatments of radiation on 11/28/2019.  He will go for radiation 5 days a week for a total of 40 treatments. 05-23-2020: The patient has successfully completed his treatments. The patient feels the radiation treatments went well.   Review of support system and available referrals to help the patient. The patient verbalized he is doing well now but if he has any needs he will let the RNCM know.   Patient Self Care Activities:   Patient verbalizes understanding of plan to effectively treat prostate cancer  Self administers medications as prescribed  Attends all scheduled provider appointments  Performs ADL's independently  Performs IADL's independently  Calls provider office for new concerns or questions  Unable to independently manage prostate cancer   Please see past updates related to this goal by clicking on the "Past Updates" button in the selected goal  The patient verbalized understanding of instructions, educational materials, and care plan provided today and declined offer to receive copy of patient instructions, educational materials, and care plan.   Telephone follow up appointment with care management team member scheduled for: 07-20-2020 at 1 pm  Noreene Larsson RN, MSN, [facility] Mobile: [phone]

## 2020-05-23 NOTE — Chronic Care Management (AMB) (Signed)
Chronic Care Management   Follow Up Note   05/23/2020 Name: Andrew Cobb MRN: [id number] DOB: Nov 08, 1950  Referred by: [name], NP Reason for referral : Chronic Care Management (RNCM Follow up for Chronic Disease Management and Care Coordination Needs)   Andrew Cobb is a 69 y.o. year old male who is a primary care patient of [name], [name], NP. The CCM team was consulted for assistance with chronic disease management and care coordination needs.    Review of patient status, including review of consultants reports, relevant laboratory and other test results, and collaboration with appropriate care team members and the patient's provider was performed as part of comprehensive patient evaluation and provision of chronic care management services.    SDOH (Social Determinants of Health) assessments performed: Yes See Care Plan activities for detailed interventions related to [facility])     Outpatient Encounter Medications as of 05/23/2020  Medication Sig  . amitriptyline (ELAVIL) 25 MG tablet Take 1 tablet (25 mg total) by mouth at bedtime.  [name] Kitchen aspirin EC 81 MG EC tablet Take 1 tablet (81 mg total) by mouth daily.  [name] Kitchen atorvastatin (LIPITOR) 40 MG tablet Take 1 tablet (40 mg total) by mouth at bedtime.  . cetirizine (ZYRTEC) 10 MG tablet Take 10 mg by mouth daily.  . Cholecalciferol (VITAMIN D) 2000 units CAPS Take 2,000 Units by mouth daily.  . empagliflozin (JARDIANCE) 10 MG TABS tablet Take 10 mg by mouth daily before breakfast.  . Lancets (ONETOUCH ULTRASOFT) lancets 1 each by Other route 2 (two) times daily. Dx E11.9  . levETIRAcetam (KEPPRA) 500 MG tablet Take 1 tablet (500 mg total) by mouth 2 (two) times daily.  . metFORMIN (GLUCOPHAGE) 500 MG tablet Take 2 tablets (1,000 mg total) by mouth 2 (two) times daily with a meal.  . Multiple Vitamins-Minerals (MULTIVITAMIN PO) Take 1 tablet by mouth daily.   [name] ULTRA test strip USE TWICE DAILY  . tamsulosin (FLOMAX)  0.4 MG CAPS capsule Take 1 capsule (0.4 mg total) by mouth daily after supper.  . telmisartan (MICARDIS) 80 MG tablet Take 1 tablet by mouth once daily  . valACYclovir (VALTREX) 500 MG tablet Take 1 tablet (500 mg total) by mouth daily.   No facility-administered encounter medications on file as of 05/23/2020.     Objective:   Goals Addressed              This Visit's Progress   .  RNCM: "I am feeling better since getting out of the hospital" (pt-stated)        [name] (see longtitudinal plan of care for additional care plan information)  Current Barriers:  . Chronic Disease Management support, education, and care coordination needs related to HTN, HLD, DMII, and Pulmonary Disease  Clinical Goal(s) related to HTN, HLD, DMII, and Pulmonary Disease:  Over the next 120 days, patient will:  . Work with the care management team to address educational, disease management, and care coordination needs  . Begin or continue self health monitoring activities as directed today Measure and record cbg (blood glucose) 2 times daily, Measure and record blood pressure 5 times per week, and adhere to a Heart healthy/ADA diet . Call provider office for new or worsened signs and symptoms Blood glucose findings outside established parameters, Blood pressure findings outside established parameters, Oxygen saturation lower than established parameter, Shortness of breath, and New or worsened symptom related to recent hospitalization due to pneumonia and the need to come  home on supplemental oxygen . Call care management team with questions or concerns . Verbalize basic understanding of patient centered plan of care established today  Interventions related to HTN, HLD, DMII, and Pulmonary Disease:  . Evaluation of current treatment plans and patient's adherence to plan as established by provider. Patient saw pcp 09/19/2019 post discharge from the hospital. The patient states he is doing much better and  is not even having to use the oxygen as much. His oxygen sats are staying 95 to 98%.  Is using the oxygen 2 liters via [place] at night. The patient also verbalized that his blood sugars have come down. States they have been in the 120's to 130's.  Patient verbalized his blood pressure is "great". The patient verbalized today that he is no longer using oxygen at all and is turning it back in. The patient was at the gym working out. He is doing 5 miles a day at the gym.  His blood sugars are 105-110 and his blood pressure is systolic 256-389 and diastolic 37-34.  Praised for keeping good track of his numbers. Praised for exercise goal being met. The patient verbalized he feels great. 05-23-2020: The patient is doing well. The patient has been busy doing his [employer] job and having a lot of fun. He is booked up the rest of November and December. He states he will have his shoulder surgery as an outpatient on 07-12-2020.  The patient will be glad to get his shoulder done as an outpatient. He said it has been needed for a long time.  . Assessed patient understanding of disease states: Patient states he understands his conditions well and is compliant with plan of care and medication regimen  . Assessed patient's education and care coordination needs- working with CCM pharmacist. Denies any needs at this time . Provided disease specific education to patient: Education and support given on Heart healthy/ADA diet. The patient states he does not eat salt. 05-23-2020: the patient endorses compliance with a heart healthy/ADA diet. The patient states he has lost about 20 pounds. He states that his blood sugars are 90 to 105 with last hemoglobin A1C of 6.7.  He states that his blood pressures are good at 287-681 systolic and 15-72 diastolic. Review of staying hydrated and eating well while staying busy on his job as [employer].  [name] with appropriate clinical care team members regarding patient needs . Evaluation of  upcoming appointments. The patient sees the pcp on 07-02-2020 at 0840 am for follow up.  Patient Self Care Activities related to HTN, HLD, DMII, and Pulmonary Disease:  . Patient is unable to independently self-manage chronic health conditions  Please see past updates related to this goal by clicking on the "Past Updates" button in the selected goal      .  RNCM: Pt-"I have prostate cancer" (pt-stated)        CARE PLAN ENTRY (see longitudinal plan of care for additional care plan information)  Current Barriers:  [name] Kitchen Knowledge Deficits related to pathophysiology of prostate cancer . Care Coordination needs related to treatment and support in a patient with prostate cancer (disease states) . Chronic Disease Management support and education needs related to prostate cancer  Nurse Case Manager Clinical Goal(s):  [name] Kitchen Over the next 120 days, patient will verbalize understanding of plan for treatment of prostate cancer . Over the next 120 days, patient will work with [facility] [place] team, and pcp to address needs related to prostate cancer  .  Over the next 120 days, patient will attend all scheduled medical appointments: the patient has several upcoming appointments for treatment of prostate cancer. The patient is happy that he will be able to take his treatments in [facility] . Over the next 120 days, patient will verbalize basic understanding of prostate disease process and self health management plan as evidenced by remaining independent during treatments and notifying team of changes in condition.   Interventions:  . Inter-disciplinary care team collaboration (see longitudinal plan of care) . Evaluation of current treatment plan related to prostate cancer treatment  and patient's adherence to plan as established by provider. 03-07-2020: the patient has finished his radiation treatments about 2 weeks ago. The patient will have blood work and testing in about 3 months to evaluate the effectiveness of the  radiation. The patient states he lost about 20 pounds but he is doing well. 05-23-2020: No new developments at this time. Follows up with provider as needed.  . Advised patient to call the RNCM or pcp for any needs or changes in condition . Provided education to patient re: taking medications as prescribed, following recommendations of cancer specialist, and keeping appointments  . Discussed plans with patient for ongoing care management follow up and provided patient with direct contact information for care management team . Reviewed scheduled/upcoming provider appointments including: The patient has several upcoming appointments. The patient will have seed placement on 11/24/2019 and will start 40 treatments of radiation on 11/28/2019.  He will go for radiation 5 days a week for a total of 40 treatments. 05-23-2020: The patient has successfully completed his treatments. The patient feels the radiation treatments went well.  . Review of support system and available referrals to help the patient. The patient verbalized he is doing well now but if he has any needs he will let the RNCM know.   Patient Self Care Activities:  . Patient verbalizes understanding of plan to effectively treat prostate cancer . Self administers medications as prescribed . Attends all scheduled provider appointments . Performs ADL's independently . Performs IADL's independently . Calls provider office for new concerns or questions . Unable to independently manage prostate cancer   Please see past updates related to this goal by clicking on the "Past Updates" button in the selected goal          Plan:   Telephone follow up appointment with care management team member scheduled for: 07-20-2020 at 1 pm   Noreene Larsson RN, MSN, [facility] Mobile: [phone]

## 2020-05-28 ENCOUNTER — Other Ambulatory Visit: Payer: Self-pay | Admitting: *Deleted

## 2020-05-28 DIAGNOSIS — C61 Malignant neoplasm of prostate: Secondary | ICD-10-CM

## 2020-05-29 ENCOUNTER — Inpatient Hospital Stay: Payer: PPO | Attending: Radiation Oncology

## 2020-05-29 ENCOUNTER — Other Ambulatory Visit: Payer: Self-pay

## 2020-05-29 DIAGNOSIS — Z923 Personal history of irradiation: Secondary | ICD-10-CM | POA: Insufficient documentation

## 2020-05-29 DIAGNOSIS — C61 Malignant neoplasm of prostate: Secondary | ICD-10-CM | POA: Diagnosis not present

## 2020-05-29 LAB — PSA: Prostatic Specific Antigen: 0.78 ng/mL (ref 0.00–4.00)

## 2020-06-06 ENCOUNTER — Encounter: Payer: Self-pay | Admitting: Radiation Oncology

## 2020-06-07 ENCOUNTER — Other Ambulatory Visit: Payer: Self-pay

## 2020-06-07 ENCOUNTER — Ambulatory Visit: Payer: PPO | Admitting: Radiation Oncology

## 2020-06-07 ENCOUNTER — Ambulatory Visit
Admission: RE | Admit: 2020-06-07 | Discharge: 2020-06-07 | Disposition: A | Discharge status: Home / Self Care | Payer: PPO | Source: Ambulatory Visit | Attending: Radiation Oncology | Admitting: Radiation Oncology

## 2020-06-07 DIAGNOSIS — Z923 Personal history of irradiation: Secondary | ICD-10-CM | POA: Diagnosis not present

## 2020-06-07 DIAGNOSIS — C61 Malignant neoplasm of prostate: Secondary | ICD-10-CM | POA: Diagnosis not present

## 2020-06-07 NOTE — Progress Notes (Signed)
Radiation Oncology Follow up Note  Name: Andrew Cobb   Date:   06/07/2020 MRN:  [id number] DOB: [birth year]    This 69 y.o. 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  REFERRING PROVIDER: [name], NP  HPI: Patient is a 69 year old male now out 4 months having completed IMRT radiation therapy for stage III (T3a adenocarcinoma the prostate.  He is seen today in routine follow-up and is doing well.  He specifically denies any increased lower urinary tract symptoms diarrhea or fatigue.  His most recent PSA.  Was 9.62  COMPLICATIONS OF TREATMENT: none  FOLLOW UP COMPLIANCE: keeps appointments   PHYSICAL EXAM:  BP (!) (P) 144/78 (BP Location: Left Arm, Patient Position: Sitting)   Pulse (!) (P) 55   Temp (!) (P) 95.2 F (35.1 C) (Tympanic)   Resp (P) 18   Wt (P) 197 lb 12.8 oz (89.7 kg)   BMI (P) 28.38 kg/m  Well-developed well-nourished patient in NAD. HEENT reveals PERLA, EOMI, discs not visualized.  Oral cavity is clear. No oral mucosal lesions are identified. Neck is clear without evidence of cervical or supraclavicular adenopathy. Lungs are clear to A&P. Cardiac examination is essentially unremarkable with regular rate and rhythm without murmur rub or thrill. Abdomen is benign with no organomegaly or masses noted. Motor sensory and DTR levels are equal and symmetric in the upper and lower extremities. Cranial nerves II through XII are grossly intact. Proprioception is intact. No peripheral adenopathy or edema is identified. No motor or sensory levels are noted. Crude visual fields are within normal range.  RADIOLOGY RESULTS: No current films to review  PLAN: Present time patient is doing well under excellent biochemical control of his prostate cancer.  I am pleased with his overall progress.  He is currently in excellent biochemical control of  his prostate cancer.  Of asked to see him back in 6 months with a PSA at that time.  Patient knows to call with any concerns.  I would like to take this opportunity to thank you for allowing me to participate in the care of your patient.Noreene Filbert, MD

## 2020-06-12 ENCOUNTER — Ambulatory Visit (INDEPENDENT_AMBULATORY_CARE_PROVIDER_SITE_OTHER): Payer: PPO | Admitting: Ophthalmology

## 2020-06-12 ENCOUNTER — Other Ambulatory Visit: Payer: Self-pay

## 2020-06-12 ENCOUNTER — Encounter (INDEPENDENT_AMBULATORY_CARE_PROVIDER_SITE_OTHER): Payer: Self-pay | Admitting: Ophthalmology

## 2020-06-12 DIAGNOSIS — H353122 Nonexudative age-related macular degeneration, left eye, intermediate dry stage: Secondary | ICD-10-CM | POA: Diagnosis not present

## 2020-06-12 DIAGNOSIS — H353211 Exudative age-related macular degeneration, right eye, with active choroidal neovascularization: Secondary | ICD-10-CM | POA: Diagnosis not present

## 2020-06-12 MED ORDER — BEVACIZUMAB 2.5 MG/0.1ML IZ SOSY
2.5000 mg | PREFILLED_SYRINGE | INTRAVITREAL | Status: AC | PRN
  Administered 2020-06-12: 2.5 mg via INTRAVITREAL

## 2020-06-12 NOTE — Patient Instructions (Signed)
Patient instructed to contact the office promptly new onset visual acuity decline or distortion

## 2020-06-12 NOTE — Progress Notes (Signed)
06/12/2020     CHIEF COMPLAINT Patient presents for Retina Follow Up   HISTORY OF PRESENT ILLNESS: Andrew Cobb is a 69 y.o. male who presents to the clinic today for:   HPI    Retina Follow Up    Patient presents with  Wet AMD.  In right eye.  Severity is moderate.  Duration of 5 weeks.  Since onset it is stable.  I, the attending physician,  performed the HPI with the patient and updated documentation appropriately.          Comments    5 Week Wet AMD f\u OD. Possible Avastin OD. OCT  Pt states no changes or issues. BGL: 110       Last edited by [name] on 06/12/2020 10:29 AM. (History)      Referring physician: [name], NP [place],  [place] [id number]  HISTORICAL INFORMATION:   Selected notes from the MEDICAL RECORD NUMBER    Lab Results  Component Value Date   HGBA1C 6.7 03/20/2020     CURRENT MEDICATIONS: No current outpatient medications on file. (Ophthalmic Drugs)   No current facility-administered medications for this visit. (Ophthalmic Drugs)   Current Outpatient Medications (Other)  Medication Sig  . amitriptyline (ELAVIL) 25 MG tablet Take 1 tablet (25 mg total) by mouth at bedtime.  [name] Kitchen aspirin EC 81 MG EC tablet Take 1 tablet (81 mg total) by mouth daily.  [name] Kitchen atorvastatin (LIPITOR) 40 MG tablet Take 1 tablet (40 mg total) by mouth at bedtime.  . cetirizine (ZYRTEC) 10 MG tablet Take 10 mg by mouth daily.  . Cholecalciferol (VITAMIN D) 2000 units CAPS Take 2,000 Units by mouth daily.  . empagliflozin (JARDIANCE) 10 MG TABS tablet Take 10 mg by mouth daily before breakfast.  . Lancets (ONETOUCH ULTRASOFT) lancets 1 each by Other route 2 (two) times daily. Dx E11.9  . levETIRAcetam (KEPPRA) 500 MG tablet Take 1 tablet (500 mg total) by mouth 2 (two) times daily.  . metFORMIN (GLUCOPHAGE) 500 MG tablet Take 2 tablets (1,000 mg total) by mouth 2 (two) times daily with a meal.  . Multiple Vitamins-Minerals (MULTIVITAMIN PO)  Take 1 tablet by mouth daily.   [name] ULTRA test strip USE TWICE DAILY  . tamsulosin (FLOMAX) 0.4 MG CAPS capsule Take 1 capsule (0.4 mg total) by mouth daily after supper.  . telmisartan (MICARDIS) 80 MG tablet Take 1 tablet by mouth once daily  . valACYclovir (VALTREX) 500 MG tablet Take 1 tablet (500 mg total) by mouth daily.   No current facility-administered medications for this visit. (Other)      REVIEW OF SYSTEMS: ROS    Positive for: Endocrine   Last edited by [name] on 06/12/2020 10:29 AM. (History)       ALLERGIES Allergies  Allergen Reactions  . Succinylsulphathiazole Rash  . Sulfamethoxazole-Trimethoprim Rash  . Tetracyclines & Related Rash    PAST MEDICAL HISTORY Past Medical History:  Diagnosis Date  . Arthritis   . Asthma   . Cancer [facility])    prostate  . Chronic venous insufficiency    varicose vein lower extremity with inflammation  . Coronary artery disease 1996   two stents placed   . Diabetes mellitus without complication ([place])    type 2 on metformin  . GERD (gastroesophageal reflux disease)    no issues since gastric bypass surgery as stated per pt  . Hyperlipidemia   . Hypogonadism in male   .  MRSA (methicillin resistant Staphylococcus aureus) infection    07/30/2008 thru 08/07/2008  . Sleep apnea    on BIPAP  . Stented coronary artery   . Stroke ([place])   . Thrombocythemia    Past Surgical History:  Procedure Laterality Date  . ANGIOPLASTY    . ANGIOPLASTY     with stent 04/07/1995  . ANTERIOR CERVICAL DECOMPRESSION/DISCECTOMY FUSION 4 LEVELS N/A 08/17/2017   Procedure: Anterior discectomy with fusion and plate fixation Cervical Three-Four, Four-Five, Five-Six, and Six-Seven Fusion;  Surgeon: Ditty, Kevan Ny, MD;  Location: [place];  Service: Neurosurgery;  Laterality: N/A;  Anterior discectomy with fusion and plate fixation Cervical Three-Four, Four-Five, Five-Six, and Six-Seven Fusion   . APPENDECTOMY     1966  .  BUBBLE STUDY  09/20/2018   Procedure: BUBBLE STUDY;  Surgeon: Josue Hector, MD;  Location: [place];  Service: Cardiovascular;;  . [place]  . CARDIOVASCULAR STRESS TEST     07/31/2011  . CARPAL TUNNEL RELEASE Left   . CHOLECYSTECTOMY     2006  . colonscopy      08/25/2012  . EYE SURGERY Bilateral    cataract  . FUNCTIONAL ENDOSCOPIC SINUS SURGERY     11/10/2013  . GASTRIC BYPASS     10/05/2012  . HERNIA REPAIR     left inguinal 1981  . INCISION AND DRAINAGE ABSCESS Right 02/26/2017   Procedure: INCISION AND DRAINAGE ABSCESS;  Surgeon: Nickie Retort, MD;  Location: [facility] ORS;  Service: Urology;  Laterality: Right;  . JOINT REPLACEMENT     bilateral  . left ankle surgery      05/03/2003   . left carpel tunnel      09/18/1993  . left knee meniscal tear      01/25/2010  . left knee meniscal tear repair      05/04/1996  . left rotator cuff repair      05/03/2003   . LOOP RECORDER INSERTION N/A 09/20/2018   Procedure: LOOP RECORDER INSERTION;  Surgeon: Evans Lance, MD;  Location: [place] CV LAB;  Service: Cardiovascular;  Laterality: N/A;  . REPLACEMENT TOTAL KNEE BILATERAL  07/13/2015  . right ankle surgery      fracture has 2 screws 07/07/1997  . right carpel tunnel      05/16/1992  . right shoulder replacement      01/27/2006  . SCROTAL EXPLORATION Right 02/26/2017   Procedure: SCROTUM EXPLORATION;  Surgeon: Nickie Retort, MD;  Location: [facility];  Service: Urology;  Laterality: Right;  . TEE WITHOUT CARDIOVERSION N/A 09/20/2018   Procedure: TRANSESOPHAGEAL ECHOCARDIOGRAM (TEE);  Surgeon: Josue Hector, MD;  Location: [facility] ENDOSCOPY;  Service: Cardiovascular;  Laterality: N/A;  . TOTAL KNEE ARTHROPLASTY Left 07/13/2015   Procedure: LEFT TOTAL KNEE ARTHROPLASTY;  Surgeon: Gaynelle Arabian, MD;  Location: [facility];  Service: Orthopedics;  Laterality: Left;    FAMILY HISTORY Family History  Problem Relation Age of Onset  . Cancer Mother         pancreatic  . Diabetes Mother   . Stroke Mother   . Heart disease Mother   . Hyperlipidemia Mother   . Hypertension Mother   . Heart attack Mother   . Heart disease Father   . Stroke Father   . Diabetes Father   . Hypertension Father   . Heart attack Father   . Hyperlipidemia Father   . Pancreatic cancer Father   . Cancer Sister   . Cancer Brother  lung  . Cancer Brother   . Kidney cancer Neg Hx   . Bladder Cancer Neg Hx   . Prostate cancer Neg Hx     SOCIAL HISTORY Social History   Tobacco Use  . Smoking status: Former Smoker    Packs/day: 1.00    Years: 10.00    Pack years: 10.00    Types: Cigarettes    Quit date: 07/07/1984    Years since quitting: 35.9  . Smokeless tobacco: Never Used  . Tobacco comment: quit 1986  Vaping Use  . Vaping Use: Never used  Substance Use Topics  . Alcohol use: No    Alcohol/week: 0.0 standard drinks  . Drug use: No         OPHTHALMIC EXAM:  Base Eye Exam    Visual Acuity (Snellen - Linear)      Right Left   Dist [place] 20/40 + 20/30   Dist ph  20/30 +        Tonometry (Tonopen, 10:34 AM)      Right Left   Pressure 14 12       Pupils      Pupils Dark Light Shape React APD   Right PERRL 3 2 Round Brisk None   Left PERRL 3 2 Round Brisk None       Neuro/Psych    Oriented x3: Yes   Mood/Affect: Normal       Dilation    Right eye: 1.0% Mydriacyl, 2.5% Phenylephrine @ 10:34 AM        Slit Lamp and Fundus Exam    External Exam      Right Left   External Normal Normal       Slit Lamp Exam      Right Left   Lids/Lashes Normal Normal   Conjunctiva/Sclera White and quiet White and quiet   Cornea Clear Clear   Anterior Chamber Deep and quiet Deep and quiet   Iris Round and reactive Round and reactive   Lens Posterior chamber intraocular lens Posterior chamber intraocular lens   Anterior Vitreous Normal Normal       Fundus Exam      Right Left   Posterior Vitreous Posterior vitreous detachment     Disc Normal    C/D Ratio 0.65    Macula Hard drusen, no exudates, Macular thickening, Soft drusen, Pigmented atrophy, Retinal pigment epithelial mottling, Retinal pigment epithelial detachment    Vessels Normal, , no DR    Periphery Normal           IMAGING AND PROCEDURES  Imaging and Procedures for 06/12/20  OCT, Retina - OU - Both Eyes       Right Eye Quality was good. Scan locations included subfoveal. Central Foveal Thickness: 329. Progression has improved. Findings include abnormal foveal contour, no IRF, subretinal fluid, pigment epithelial detachment.   Left Eye Quality was good. Scan locations included subfoveal. Central Foveal Thickness: 292. Progression has been stable. Findings include abnormal foveal contour, retinal drusen , no IRF, no SRF.   Notes Persistent subfoveal pigment epithelial detachment subretinal fluid, no intraretinal fluid OD, will repeat injection intravitreal Avastin today to maintain and repeat again examination in 5 weeks       Intravitreal Injection, Pharmacologic Agent - OD - Right Eye       Time Out 06/12/2020. 11:23 AM. Confirmed correct patient, procedure, site, and patient consented.   Anesthesia Topical anesthesia was used. Anesthetic medications included Akten 3.5%.   Procedure Preparation included Tobramycin  0.3%, 10% betadine to eyelids, 5% betadine to ocular surface. A 30 gauge needle was used.   Injection:  2.5 mg Bevacizumab (AVASTIN) 2.5mg /0.52mL SOSY   NDC: 93790-240-97, Lot: [id number]   Route: Intravitreal, Site: Right Eye  Post-op Post injection exam found visual acuity of at least counting fingers. The patient tolerated the procedure well. There were no complications. The patient received written and verbal post procedure care education. Post injection medications were not given.                 ASSESSMENT/PLAN:  Exudative age-related macular degeneration of right eye with active choroidal neovascularization  (HCC) Wet ARMD OD, stable at 5-week follow-up, repeat injection today and examination again in 5 weeks is recommended yet patient having shoulder replacement surgery January 5, will extend interval to 8 weeks  Intermediate stage nonexudative age-related macular degeneration of left eye No signs of CNVM OS      ICD-10-CM   1. Exudative age-related macular degeneration of right eye with active choroidal neovascularization (HCC)  H35.3211 OCT, Retina - OU - Both Eyes    Intravitreal Injection, Pharmacologic Agent - OD - Right Eye    bevacizumab (AVASTIN) SOSY 2.5 mg  2. Intermediate stage nonexudative age-related macular degeneration of left eye  H35.3122     1.  We will repeat injection intravitreal Avastin today  2.  Amend exam in 5 weeks yet patient having shoulder replacement surgery thus will follow-up in 8 weeks to comply with his recovery process  3.  Ophthalmic Meds Ordered this visit:  Meds ordered this encounter  Medications  . bevacizumab (AVASTIN) SOSY 2.5 mg       Return in about 8 weeks (around 08/07/2020) for dilate, OD, AVASTIN OCT.  Patient Instructions  Patient instructed to contact the office promptly new onset visual acuity decline or distortion    Explained the diagnoses, plan, and follow up with the patient and they expressed understanding.  Patient expressed understanding of the importance of proper follow up care.   [name] Amarilys Lyles M.D. Diseases & Surgery of the Retina and Vitreous Retina & Diabetic [place] 06/12/20     Abbreviations: M myopia (nearsighted); A astigmatism; H hyperopia (farsighted); P presbyopia; Mrx spectacle prescription;  CTL contact lenses; OD right eye; OS left eye; OU both eyes  XT exotropia; ET esotropia; PEK punctate epithelial keratitis; PEE punctate epithelial erosions; DES dry eye syndrome; MGD meibomian gland dysfunction; ATs artificial tears; PFAT's preservative free artificial tears; Bryce nuclear sclerotic cataract; PSC  posterior subcapsular cataract; ERM epi-retinal membrane; PVD posterior vitreous detachment; RD retinal detachment; DM diabetes mellitus; DR diabetic retinopathy; NPDR non-proliferative diabetic retinopathy; PDR proliferative diabetic retinopathy; CSME clinically significant macular edema; DME diabetic macular edema; dbh dot blot hemorrhages; CWS cotton wool spot; POAG primary open angle glaucoma; C/D cup-to-disc ratio; HVF humphrey visual field; GVF goldmann visual field; OCT optical coherence tomography; IOP intraocular pressure; BRVO Branch retinal vein occlusion; CRVO central retinal vein occlusion; CRAO central retinal artery occlusion; BRAO branch retinal artery occlusion; RT retinal tear; SB scleral buckle; PPV pars plana vitrectomy; VH Vitreous hemorrhage; PRP panretinal laser photocoagulation; IVK intravitreal kenalog; VMT vitreomacular traction; MH Macular hole;  NVD neovascularization of the disc; NVE neovascularization elsewhere; AREDS age related eye disease study; ARMD age related macular degeneration; POAG primary open angle glaucoma; EBMD epithelial/anterior basement membrane dystrophy; ACIOL anterior chamber intraocular lens; IOL intraocular lens; PCIOL posterior chamber intraocular lens; Phaco/IOL phacoemulsification with intraocular lens placement; PRK photorefractive keratectomy; LASIK laser assisted in  situ keratomileusis; HTN hypertension; DM diabetes mellitus; COPD chronic obstructive pulmonary disease

## 2020-06-12 NOTE — Assessment & Plan Note (Addendum)
Wet ARMD OD, stable at 5-week follow-up, repeat injection today and examination again in 5 weeks is recommended yet patient having shoulder replacement surgery January 5, will extend interval to 8 weeks

## 2020-06-12 NOTE — Assessment & Plan Note (Signed)
No signs of CNVM OS

## 2020-06-15 NOTE — Telephone Encounter (Signed)
Sent request to [facility] team requesting review and turn over reversal.

## 2020-06-22 NOTE — Telephone Encounter (Signed)
Claim rebilled to insurance.

## 2020-06-25 ENCOUNTER — Ambulatory Visit (INDEPENDENT_AMBULATORY_CARE_PROVIDER_SITE_OTHER): Payer: PPO

## 2020-06-25 DIAGNOSIS — I639 Cerebral infarction, unspecified: Secondary | ICD-10-CM

## 2020-06-25 LAB — CUP PACEART REMOTE DEVICE CHECK
Date Time Interrogation Session: 20211218231304
Implantable Pulse Generator Implant Date: 20200316

## 2020-06-26 ENCOUNTER — Telehealth: Payer: Self-pay | Admitting: Pharmacist

## 2020-06-26 NOTE — Progress Notes (Signed)
    Chronic Care Management Pharmacy Assistant   Name: KENDREW PACI  MRN: [id number] DOB: 1950/09/08  Reason for Encounter: Patient Assistance Application  PCP : [name], NP  Allergies:   Allergies  Allergen Reactions  . Succinylsulphathiazole Rash  . Sulfamethoxazole-Trimethoprim Rash  . Tetracyclines & Related Rash    Medications: Outpatient Encounter Medications as of 06/26/2020  Medication Sig  . amitriptyline (ELAVIL) 25 MG tablet Take 1 tablet (25 mg total) by mouth at bedtime.  [name] Kitchen aspirin EC 81 MG EC tablet Take 1 tablet (81 mg total) by mouth daily.  [name] Kitchen atorvastatin (LIPITOR) 40 MG tablet Take 1 tablet (40 mg total) by mouth at bedtime.  . cetirizine (ZYRTEC) 10 MG tablet Take 10 mg by mouth daily.  . Cholecalciferol (VITAMIN D) 2000 units CAPS Take 2,000 Units by mouth daily.  . empagliflozin (JARDIANCE) 10 MG TABS tablet Take 10 mg by mouth daily before breakfast.  . Lancets (ONETOUCH ULTRASOFT) lancets 1 each by Other route 2 (two) times daily. Dx E11.9  . levETIRAcetam (KEPPRA) 500 MG tablet Take 1 tablet (500 mg total) by mouth 2 (two) times daily.  . metFORMIN (GLUCOPHAGE) 500 MG tablet Take 2 tablets (1,000 mg total) by mouth 2 (two) times daily with a meal.  . Multiple Vitamins-Minerals (MULTIVITAMIN PO) Take 1 tablet by mouth daily.   [name] ULTRA test strip USE TWICE DAILY  . tamsulosin (FLOMAX) 0.4 MG CAPS capsule Take 1 capsule (0.4 mg total) by mouth daily after supper.  . telmisartan (MICARDIS) 80 MG tablet Take 1 tablet by mouth once daily  . valACYclovir (VALTREX) 500 MG tablet Take 1 tablet (500 mg total) by mouth daily.   No facility-administered encounter medications on file as of 06/26/2020.    Current Diagnosis: Patient Active Problem List   Diagnosis Date Noted  . Intermediate stage nonexudative age-related macular degeneration of left eye 04/03/2020  . Serous detachment of retinal pigment epithelium of right eye 01/31/2020  .  Pseudophakia 01/31/2020  . Exudative age-related macular degeneration of right eye with active choroidal neovascularization ([place]) 01/31/2020  . Situational depression 10/18/2019  . Seizures ([place]) 10/18/2019  . Hyperlipidemia associated with type 2 diabetes mellitus ([place]) 06/19/2019  . Expressive aphasia 06/19/2019  . Vitamin D deficiency 06/19/2019  . Chronic venous insufficiency   . Asthma   . Arthritis   . GERD (gastroesophageal reflux disease)   . Hypogonadism in male   . History of stroke   . H/O right coronary artery stent placement   . Allergic rhinitis 08/19/2018  . Cervical radiculopathy 08/17/2017  . Advanced care planning/counseling discussion 07/21/2017  . Prostate cancer ([place]) 12/08/2016  . H/O bariatric surgery 07/17/2016  . Hypertension associated with diabetes ([place]) 04/24/2015  . Uncontrolled type 2 diabetes mellitus with hyperglycemia, without long-term current use of insulin ([place]) 04/24/2015  . Chronic sinusitis 08/13/2009  . Coronary atherosclerosis 04/02/2009  . OSA (obstructive sleep apnea) 04/02/2009  . Coronary artery disease 1996    06-26-20: Called the patient to explain that his patient assistance application has been initiated and we are needing his signature and proof of income before the provider can sign the application. Instructed that patient that his application would be at the front desk of [facility] on 06-27-2020. The patient verbalized understanding of information.   Cloretta Ned, LPN Clinical Pharmacist Assistant  [phone]   Follow-Up:  Patient Assistance Coordination

## 2020-06-28 NOTE — Patient Instructions (Addendum)
DUE TO COVID-19 ONLY ONE VISITOR IS ALLOWED TO COME WITH YOU AND STAY IN THE WAITING ROOM ONLY DURING PRE OP AND PROCEDURE DAY OF SURGERY. THE 1 VISITOR  MAY VISIT WITH YOU AFTER SURGERY IN YOUR PRIVATE ROOM DURING VISITING HOURS ONLY!  YOU NEED TO HAVE A COVID 19 TEST ON_1/3/22_____ @_10 :30____, THIS TEST MUST BE DONE BEFORE SURGERY,  COVID TESTING SITE [address] [place] , IT IS ON THE RIGHT GOING OUT [address] APPROXIMATELY  2 MINUTES PAST [employer] ON THE RIGHT. ONCE YOUR COVID TEST IS COMPLETED,  PLEASE BEGIN THE QUARANTINE INSTRUCTIONS AS OUTLINED IN YOUR HANDOUT.                Andrew Cobb    Your procedure is scheduled on: 07/12/20   Report to [facility] Main  Entrance   Report to Short stay at 5:30 AM     Call this number if you have problems the morning of surgery [phone]   . BRUSH YOUR TEETH MORNING OF SURGERY AND RINSE YOUR MOUTH OUT, NO CHEWING GUM CANDY OR MINTS.    No food after midnight.    You may have clear liquid until 4:30 AM.    At 4:00 AM drink pre surgery drink  . Nothing by mouth after 4:30 AM.   Take these medicines the morning of surgery with A SIP OF WATER: Tamsulosin, Keppra. Bring your mask and tubing.  DO NOT TAKE ANY DIABETIC MEDICATIONS DAY OF YOUR SURGERY                     How to Manage Your Diabetes Before and After Surgery  Why is it important to control my blood sugar before and after surgery?  Improving blood sugar levels before and after surgery helps healing and can limit problems.  A way of improving blood sugar control is eating a healthy diet by: o  Eating less sugar and carbohydrates o  Increasing activity/exercise o  Talking with your doctor about reaching your blood sugar goals  High blood sugars (greater than 180 mg/dL) can raise your risk of infections and slow your recovery, so you will need to focus on controlling your diabetes during the weeks before  surgery.  Make sure that the doctor who takes care of your diabetes knows about your planned surgery including the date and location.  How do I manage my blood sugar before surgery?  Check your blood sugar at least 4 times a day, starting 2 days before surgery, to make sure that the level is not too high or low. o Check your blood sugar the morning of your surgery when you wake up and every 2 hours until you get to the Short Stay unit.  If your blood sugar is less than 70 mg/dL, you will need to treat for low blood sugar: o Do not take insulin. o Treat a low blood sugar (less than 70 mg/dL) with  cup of clear juice (cranberry or apple), 4 glucose tablets, OR glucose gel. o Recheck blood sugar in 15 minutes after treatment (to make sure it is greater than 70 mg/dL). If your blood sugar is not greater than 70 mg/dL on recheck, call [phone] for further instructions.  Report your blood sugar to the short stay nurse when you get to Short Stay.   If you are admitted to the hospital after surgery: o Your blood sugar will be checked by the staff and you will  probably be given insulin after surgery (instead of oral diabetes medicines) to make sure you have good blood sugar levels. o The goal for blood sugar control after surgery is 80-180 mg/dL.   WHAT DO I DO ABOUT MY DIABETES MEDICATION?   Do not take oral diabetes medicines (pills) the morning of surgery. .          You may not have any metal on your body including              piercings  Do not wear jewelry,, lotions, powders or deodorant               Men may shave face and neck.   Do not bring valuables to the hospital. Andrew Cobb.  Contacts, dentures or bridgework may not be worn into surgery.      Patients discharged the day of surgery will not be allowed to drive home.   IF YOU ARE HAVING SURGERY AND GOING HOME THE SAME DAY, YOU MUST HAVE AN ADULT TO DRIVE YOU HOME AND BE  WITH YOU FOR 24 HOURS.   YOU MAY GO HOME BY TAXI OR UBER OR ORTHERWISE, BUT AN ADULT MUST ACCOMPANY YOU HOME AND STAY WITH YOU FOR 24 HOURS.  Name and phone number of your driver:  Special Instructions: N/A              Please read over the following fact sheets you were given: _____________________________________________________________________             [facility]- Preparing for Total Shoulder Arthroplasty    Before surgery, you can play an important role. Because skin is not sterile, your skin needs to be as free of germs as possible. You can reduce the number of germs on your skin by using the following products.  Benzoyl Peroxide Gel o Reduces the number of germs present on the skin o Applied twice a day to shoulder area starting two days before surgery    ==================================================================  Please follow these instructions carefully:  BENZOYL PEROXIDE 5% GEL  Please do not use if you have an allergy to benzoyl peroxide.   If your skin becomes reddened/irritated stop using the benzoyl peroxide.  Starting two days before surgery, apply as follows: 1. Apply benzoyl peroxide in the morning and at night. Apply after taking a shower. If you are not taking a shower clean entire shoulder front, back, and side along with the armpit with a clean wet washcloth.  2. Place a quarter-sized dollop on your shoulder and rub in thoroughly, making sure to cover the front, back, and side of your shoulder, along with the armpit.   2 days before ____ AM   ____ PM              1 day before ____ AM   ____ PM                         3. Do this twice a day for two days.  (Last application is the night before surgery, AFTER using the CHG soap as described below).  4. Do NOT apply benzoyl peroxide gel on the day of surgery. 5.  Andrew Cobb - Preparing for Surgery Before surgery, you can play an important role.   Because skin is not sterile, your skin needs to be  as free of  germs as possible.   You can reduce the number of germs on your skin by washing with CHG (chlorahexidine gluconate) soap before surgery.   CHG is an antiseptic cleaner which kills germs and bonds with the skin to continue killing germs even after washing. Please DO NOT use if you have an allergy to CHG or antibacterial soaps .  If your skin becomes reddened/irritated stop using the CHG and inform your nurse when you arrive at Short Stay. Do not shave (including legs and underarms) for at least 48 hours prior to the first CHG shower.   1.  Shower with CHG Soap the night before surgery and the  morning of Surgery.  2.  If you choose to wash your hair, wash your hair first as usual with your  normal  shampoo.  3.  After you shampoo, rinse your hair and body thoroughly to remove the  shampoo.                                        4.  Use CHG as you would any other liquid soap.  You can apply chg directly  to the skin and wash                       Gently with a scrungie or clean washcloth.  5.  Apply the CHG Soap to your body ONLY FROM THE NECK DOWN.   Do not use on face/ open                           Wound or open sores. Avoid contact with eyes, ears mouth and genitals (private parts).                       Wash face,  Genitals (private parts) with your normal soap.             6.  Wash thoroughly, paying special attention to the area where your surgery  will be performed.  7.  Thoroughly rinse your body with warm water from the neck down.  8.  DO NOT shower/wash with your normal soap after using and rinsing off  the CHG Soap.              9.  Pat yourself dry with a clean towel.            10.  Wear clean pajamas.            11.  Place clean sheets on your bed the night of your first shower and do not  sleep with pets. Day of Surgery : Do not apply any lotions/deodorants the morning of surgery.  Please wear clean clothes to the hospital/surgery center.  FAILURE TO FOLLOW THESE  INSTRUCTIONS MAY RESULT IN THE CANCELLATION OF YOUR SURGERY PATIENT SIGNATURE_________________________________  NURSE SIGNATURE__________________________________  ________________________________________________________________________   Rogelia Mire  An incentive spirometer is a tool that can help keep your lungs clear and active. This tool measures how well you are filling your lungs with each breath. Taking long deep breaths may help reverse or decrease the chance of developing breathing (pulmonary) problems (especially infection) following:  A long period of time when you are unable to move or be active. BEFORE THE PROCEDURE   If the spirometer includes an indicator to show your best  effort, your nurse or respiratory therapist will set it to a desired goal.  If possible, sit up straight or lean slightly forward. Try not to slouch.  Hold the incentive spirometer in an upright position. INSTRUCTIONS FOR USE  1. Sit on the edge of your bed if possible, or sit up as far as you can in bed or on a chair. 2. Hold the incentive spirometer in an upright position. 3. Breathe out normally. 4. Place the mouthpiece in your mouth and seal your lips tightly around it. 5. Breathe in slowly and as deeply as possible, raising the piston or the ball toward the top of the column. 6. Hold your breath for 3-5 seconds or for as long as possible. Allow the piston or ball to fall to the bottom of the column. 7. Remove the mouthpiece from your mouth and breathe out normally. 8. Rest for a few seconds and repeat Steps 1 through 7 at least 10 times every 1-2 hours when you are awake. Take your time and take a few normal breaths between deep breaths. 9. The spirometer may include an indicator to show your best effort. Use the indicator as a goal to work toward during each repetition. 10. After each set of 10 deep breaths, practice coughing to be sure your lungs are clear. If you have an incision (the  cut made at the time of surgery), support your incision when coughing by placing a pillow or rolled up towels firmly against it. Once you are able to get out of bed, walk around indoors and cough well. You may stop using the incentive spirometer when instructed by your caregiver.  RISKS AND COMPLICATIONS  Take your time so you do not get dizzy or light-headed.  If you are in pain, you may need to take or ask for pain medication before doing incentive spirometry. It is harder to take a deep breath if you are having pain. AFTER USE  Rest and breathe slowly and easily.  It can be helpful to keep track of a log of your progress. Your caregiver can provide you with a simple table to help with this. If you are using the spirometer at home, follow these instructions: [place] IF:   You are having difficultly using the spirometer.  You have trouble using the spirometer as often as instructed.  Your pain medication is not giving enough relief while using the spirometer.  You develop fever of 100.5 F (38.1 C) or higher. SEEK IMMEDIATE MEDICAL CARE IF:   You cough up bloody sputum that had not been present before.  You develop fever of 102 F (38.9 C) or greater.  You develop worsening pain at or near the incision site. MAKE SURE YOU:   Understand these instructions.  Will watch your condition.  Will get help right away if you are not doing well or get worse. Document Released: 11/03/2006 Document Revised: 09/15/2011 Document Reviewed: 01/04/2007 [facility] Patient Information 2014 [place], [place].   ________________________________________________________________________

## 2020-07-02 ENCOUNTER — Other Ambulatory Visit: Payer: Self-pay

## 2020-07-02 ENCOUNTER — Encounter: Payer: Self-pay | Admitting: Nurse Practitioner

## 2020-07-02 ENCOUNTER — Ambulatory Visit (INDEPENDENT_AMBULATORY_CARE_PROVIDER_SITE_OTHER): Payer: PPO | Admitting: Nurse Practitioner

## 2020-07-02 VITALS — BP 118/78 | HR 58 | Temp 98.1°F | Ht 69.49 in | Wt 199.8 lb

## 2020-07-02 DIAGNOSIS — I152 Hypertension secondary to endocrine disorders: Secondary | ICD-10-CM | POA: Diagnosis not present

## 2020-07-02 DIAGNOSIS — E785 Hyperlipidemia, unspecified: Secondary | ICD-10-CM

## 2020-07-02 DIAGNOSIS — C61 Malignant neoplasm of prostate: Secondary | ICD-10-CM | POA: Diagnosis not present

## 2020-07-02 DIAGNOSIS — R569 Unspecified convulsions: Secondary | ICD-10-CM | POA: Diagnosis not present

## 2020-07-02 DIAGNOSIS — J4520 Mild intermittent asthma, uncomplicated: Secondary | ICD-10-CM

## 2020-07-02 DIAGNOSIS — G4733 Obstructive sleep apnea (adult) (pediatric): Secondary | ICD-10-CM

## 2020-07-02 DIAGNOSIS — E538 Deficiency of other specified B group vitamins: Secondary | ICD-10-CM | POA: Diagnosis not present

## 2020-07-02 DIAGNOSIS — E1169 Type 2 diabetes mellitus with other specified complication: Secondary | ICD-10-CM | POA: Diagnosis not present

## 2020-07-02 DIAGNOSIS — E1159 Type 2 diabetes mellitus with other circulatory complications: Secondary | ICD-10-CM

## 2020-07-02 DIAGNOSIS — E1165 Type 2 diabetes mellitus with hyperglycemia: Secondary | ICD-10-CM | POA: Diagnosis not present

## 2020-07-02 DIAGNOSIS — H353211 Exudative age-related macular degeneration, right eye, with active choroidal neovascularization: Secondary | ICD-10-CM

## 2020-07-02 DIAGNOSIS — Z8673 Personal history of transient ischemic attack (TIA), and cerebral infarction without residual deficits: Secondary | ICD-10-CM

## 2020-07-02 LAB — BAYER DCA HB A1C WAIVED: HB A1C (BAYER DCA - WAIVED): 6.6 % (ref <7.0)

## 2020-07-02 MED ORDER — TAMSULOSIN HCL 0.4 MG PO CAPS: 0.4000 mg | ORAL_CAPSULE | Freq: Every day | ORAL | 4 refills | Status: DC

## 2020-07-02 MED ORDER — TELMISARTAN 80 MG PO TABS
40.0000 mg | ORAL_TABLET | Freq: Every day | ORAL | 4 refills | Status: DC
Start: 2020-07-02 — End: 2020-11-23

## 2020-07-02 MED ORDER — METFORMIN HCL 1000 MG PO TABS: 1000.0000 mg | ORAL_TABLET | Freq: Two times a day (BID) | ORAL | 4 refills | Status: DC

## 2020-07-02 NOTE — Assessment & Plan Note (Signed)
Continue collaboration with neurology due to significant stroke history.  Continue current medication regimen. 

## 2020-07-02 NOTE — Assessment & Plan Note (Signed)
Chronic, ongoing with A1C today 6.6% (downward trend),  plus urine ALB 10 and A:C <30 last visit.  Sugars are trending down at home.  Neuro goal <6.5%.  Continue  Metformin 1000 MG BID and continue Jardiance 10 MG, will increase Jardiance if A1C elevation noted next visit.  Recommend he check BS twice daily + continue focus on regular exercise and diet.  Return in 3 months for A1C check.

## 2020-07-02 NOTE — Patient Instructions (Signed)
Carbohydrate Counting for Diabetes Mellitus, Adult  Carbohydrate counting is a method of keeping track of how many carbohydrates you eat. Eating carbohydrates naturally increases the amount of sugar (glucose) in the blood. Counting how many carbohydrates you eat helps keep your blood glucose within normal limits, which helps you manage your diabetes (diabetes mellitus). It is important to know how many carbohydrates you can safely have in each meal. This is different for every person. A diet and nutrition specialist (registered dietitian) can help you make a meal plan and calculate how many carbohydrates you should have at each meal and snack. Carbohydrates are found in the following foods:  Grains, such as breads and cereals.  Dried beans and soy products.  Starchy vegetables, such as potatoes, peas, and corn.  Fruit and fruit juices.  Milk and yogurt.  Sweets and snack foods, such as cake, cookies, candy, chips, and soft drinks. How do I count carbohydrates? There are two ways to count carbohydrates in food. You can use either of the methods or a combination of both. Reading "Nutrition Facts" on packaged food The "Nutrition Facts" list is included on the labels of almost all packaged foods and beverages in the [place] It includes:  The serving size.  Information about nutrients in each serving, including the grams (g) of carbohydrate per serving. To use the "Nutrition Facts":  Decide how many servings you will have.  Multiply the number of servings by the number of carbohydrates per serving.  The resulting number is the total amount of carbohydrates that you will be having. Learning standard serving sizes of other foods When you eat carbohydrate foods that are not packaged or do not include "Nutrition Facts" on the label, you need to measure the servings in order to count the amount of carbohydrates:  Measure the foods that you will eat with a food scale or measuring cup, if  needed.  Decide how many standard-size servings you will eat.  Multiply the number of servings by 15. Most carbohydrate-rich foods have about 15 g of carbohydrates per serving. ? For example, if you eat 8 oz (170 g) of strawberries, you will have eaten 2 servings and 30 g of carbohydrates (2 servings x 15 g = 30 g).  For foods that have more than one food mixed, such as soups and casseroles, you must count the carbohydrates in each food that is included. The following list contains standard serving sizes of common carbohydrate-rich foods. Each of these servings has about 15 g of carbohydrates:   hamburger bun or  English muffin.   oz (15 mL) syrup.   oz (14 g) jelly.  1 slice of bread.  1 six-inch tortilla.  3 oz (85 g) cooked rice or pasta.  4 oz (113 g) cooked dried beans.  4 oz (113 g) starchy vegetable, such as peas, corn, or potatoes.  4 oz (113 g) hot cereal.  4 oz (113 g) mashed potatoes or  of a large baked potato.  4 oz (113 g) canned or frozen fruit.  4 oz (120 mL) fruit juice.  4-6 crackers.  6 chicken nuggets.  6 oz (170 g) unsweetened dry cereal.  6 oz (170 g) plain fat-free yogurt or yogurt sweetened with artificial sweeteners.  8 oz (240 mL) milk.  8 oz (170 g) fresh fruit or one small piece of fruit.  24 oz (680 g) popped popcorn. Example of carbohydrate counting Sample meal  3 oz (85 g) chicken breast.  6 oz (170 g)   brown rice.  4 oz (113 g) corn.  8 oz (240 mL) milk.  8 oz (170 g) strawberries with sugar-free whipped topping. Carbohydrate calculation 1. Identify the foods that contain carbohydrates: ? Rice. ? Corn. ? Milk. ? Strawberries. 2. Calculate how many servings you have of each food: ? 2 servings rice. ? 1 serving corn. ? 1 serving milk. ? 1 serving strawberries. 3. Multiply each number of servings by 15 g: ? 2 servings rice x 15 g = 30 g. ? 1 serving corn x 15 g = 15 g. ? 1 serving milk x 15 g = 15 g. ? 1  serving strawberries x 15 g = 15 g. 4. Add together all of the amounts to find the total grams of carbohydrates eaten: ? 30 g + 15 g + 15 g + 15 g = 75 g of carbohydrates total. Summary  Carbohydrate counting is a method of keeping track of how many carbohydrates you eat.  Eating carbohydrates naturally increases the amount of sugar (glucose) in the blood.  Counting how many carbohydrates you eat helps keep your blood glucose within normal limits, which helps you manage your diabetes.  A diet and nutrition specialist (registered dietitian) can help you make a meal plan and calculate how many carbohydrates you should have at each meal and snack. This information is not intended to replace advice given to you by your health care provider. Make sure you discuss any questions you have with your health care provider. Document Revised: 01/15/2017 Document Reviewed: 12/05/2015 [employer] Patient Education  2020 [employer]  

## 2020-07-02 NOTE — Assessment & Plan Note (Signed)
Continue collaboration with ophthalmology. 

## 2020-07-02 NOTE — Assessment & Plan Note (Signed)
Currently undergoing IMRT, continue collaboration with oncology and urology.  Recent notes reviewed. 

## 2020-07-02 NOTE — Progress Notes (Signed)
BP 118/78   Pulse (!) 58 Comment: apical  Temp 98.1 F (36.7 C) (Oral)   Ht 5' 9.49" (1.765 m)   Wt 199 lb 12.8 oz (90.6 kg)   SpO2 95%   BMI 29.09 kg/m    Subjective:    Patient ID: Andrew Cobb, male    DOB: [birth year], 69 y.o.   MRN: [id number]  HPI: Andrew Cobb is a 69 y.o. male  Chief Complaint  Patient presents with  . Diabetes   DIABETES Last A1C inSept 6.7%, which is continued to trend down from beginning of year 9.2%. He continues on Metformin 1000 MG BID and Jardiance 10 MG.  Has history of bariatric surgery 8 years ago.  Followed by ophthalmology for macular degeneration. Hypoglycemic episodes:no Polydipsia/polyuria:no Visual disturbance:no Chest pain:no Paresthesias:no Glucose Monitoring:yes Accucheck frequency: Daily Fasting glucose:90 to 105 range Post prandial: Evening: Before meals: Taking Insulin?:no Long acting insulin: Short acting insulin: Blood Pressure Monitoring:daily Retinal Examination:Up to Date Foot Exam:Up to Date Pneumovax:Up To Date Influenza:Up to Date Aspirin:yes  HYPERTENSION / HYPERLIPIDEMIA Continues Telmisartan 40 MG and Atorvastatin 40 MG daily. Followed by neurology due to history of stroke events x 3. Currently has loop recorder in place. 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 Previously saw Dr. Cassie Freer, has not been followed in 7-8 years by him. Satisfied with current treatment?yes Duration of hypertension:chronic BP monitoring frequency:daily BP range:110 -120/70-80 BP medication side effects:no Duration of hyperlipidemia:chronic Cholesterol medication side effects:no Cholesterol supplements: none Medication compliance:good compliance Aspirin:yes Recent stressors:no Recurrent headaches:no Visual changes:no Palpitations:no Dyspnea:yes Chest pain:no Lower extremity  edema:no Dizzy/lightheaded:no The ASCVD Risk score Denman George DC Jr., et al., 2013) failed to calculate for the following reasons:   The patient has a prior MI or stroke diagnosis  HISTORY OF PROSTATE CA: Previously followed by Dr. Lonna Cobb. Last seen 11/24/2019 and went through IMRT for 40 days, reports he is tolerated this well.  Last treatment was on 02/02/2020.  Saw Dr. Rushie Chestnut last 03/08/20.  SEIZURES: Seen by neurology on 05/09/2020 and recommendation to continue Keppra 500 MG BID + ASA and Atorvastatin.  No recent seizure activity. Reports he is walking 10 miles a day and going to [facility], states the only thing that frustrates him is he can not drive for at least 6 months.  Continues to have loop recorder in place.  Is to return to neurology in one year.  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  At this time is to continue PRN Ventolin and Zyrtec daily. Asthma status: controlled Satisfied with current treatment?: yes Albuterol/rescue inhaler frequency: none Dyspnea frequency: none Wheezing frequency: none Cough frequency: none Nocturnal symptom frequency: none Limitation of activity: no Current upper respiratory symptoms: no Triggers: environmental Home peak flows: none Last Spirometry: with pulmonary August 2021 Failed/intolerant to following asthma meds: none Asthma meds in past: none Aerochamber/spacer use: no Visits to ER or Urgent Care in past year: no Pneumovax: Up To Date Influenza: Up to Date   Relevant past medical, surgical, family and social history reviewed and updated as indicated. Interim medical history since our last visit reviewed. Allergies and medications reviewed and updated.  Review of Systems  Constitutional: Negative for activity change, diaphoresis, fatigue and fever.  Respiratory: Negative for cough, chest tightness, shortness of breath and wheezing.   Cardiovascular: Negative for chest pain,  palpitations and leg swelling.  Gastrointestinal: Negative.   Endocrine: Negative for polydipsia, polyphagia and polyuria.  Neurological: Negative.  Psychiatric/Behavioral: Negative.     Per HPI unless specifically indicated above     Objective:    BP 118/78   Pulse (!) 58 Comment: apical  Temp 98.1 F (36.7 C) (Oral)   Ht 5' 9.49" (1.765 m)   Wt 199 lb 12.8 oz (90.6 kg)   SpO2 95%   BMI 29.09 kg/m   Wt Readings from Last 3 Encounters:  07/02/20 199 lb 12.8 oz (90.6 kg)  06/07/20 (P) 197 lb 12.8 oz (89.7 kg)  05/09/20 198 lb (89.8 kg)    Physical Exam Vitals and nursing note reviewed.  Constitutional:      General: He is awake. He is not in acute distress.    Appearance: He is well-developed and well-groomed. He is not ill-appearing.  HENT:     Head: Normocephalic and atraumatic.     Right Ear: Hearing normal. No drainage.     Left Ear: Hearing normal. No drainage.     Nose:     Comments: Mask in place. Eyes:     General: Lids are normal.        Right eye: No discharge.        Left eye: No discharge.     Conjunctiva/sclera: Conjunctivae normal.     Pupils: Pupils are equal, round, and reactive to light.  Neck:     Thyroid: No thyromegaly.     Vascular: No carotid bruit or JVD.     Trachea: Trachea normal.  Cardiovascular:     Rate and Rhythm: Normal rate and regular rhythm.     Heart sounds: Normal heart sounds, S1 normal and S2 normal. No murmur heard. No gallop.   Pulmonary:     Effort: Pulmonary effort is normal. No accessory muscle usage or respiratory distress.     Breath sounds: Normal breath sounds.  Abdominal:     General: Bowel sounds are normal.     Palpations: Abdomen is soft. There is no hepatomegaly or splenomegaly.  Musculoskeletal:        General: Normal range of motion.     Cervical back: Normal range of motion and neck supple.     Right lower leg: No edema.     Left lower leg: No edema.  Skin:    General: Skin is warm and dry.      Capillary Refill: Capillary refill takes less than 2 seconds.  Neurological:     Mental Status: He is alert and oriented to person, place, and time.     Deep Tendon Reflexes: Reflexes are normal and symmetric.  Psychiatric:        Attention and Perception: Attention normal.        Mood and Affect: Mood normal.        Speech: Speech normal.        Behavior: Behavior normal. Behavior is cooperative.        Thought Content: Thought content normal.    Diabetic Foot Exam - Simple   Simple Foot Form Visual Inspection No deformities, no ulcerations, no other skin breakdown bilaterally: Yes Sensation Testing Intact to touch and monofilament testing bilaterally: Yes Pulse Check Posterior Tibialis and Dorsalis pulse intact bilaterally: Yes Comments     Results for orders placed or performed in visit on 06/25/20  CUP PACEART REMOTE DEVICE CHECK  Result Value Ref Range   Date Time Interrogation Session [phone]    Pulse Generator Manufacturer MERM    Pulse Gen Model [id number] Reveal LINQ    Pulse Gen Serial Number  [id number]    Clinic Name [facility]    Implantable Pulse Generator Type ICM/ILR    Implantable Pulse Generator Implant Date [id number]       Assessment & Plan:   Problem List Items Addressed This Visit      Cardiovascular and Mediastinum   Hypertension associated with diabetes (HCC)    Chronic, ongoing with BP below neuro goal <130/90.  Continue current medication regimen and adjust as needed.  Recommend he monitor BP at home daily and document.  Continue focus on DASH diet and regular exercise at home.  BMP today.  Return in 3 months.      Relevant Medications   telmisartan (MICARDIS) 80 MG tablet   metFORMIN (GLUCOPHAGE) 1000 MG tablet   Other Relevant Orders   Bayer DCA Hb A1c Waived   Basic metabolic panel   Exudative age-related macular degeneration of right eye with active choroidal neovascularization (HCC)    Continue collaboration with ophthalmology.        Relevant Medications   telmisartan (MICARDIS) 80 MG tablet     Respiratory   OSA (obstructive sleep apnea)    Continue 100% adherence to CPAP use.        Asthma    Chronic, ongoing.  Stable at this time.  Continue current medication regimen and collaboration with pulmonary.        Endocrine   Uncontrolled type 2 diabetes mellitus with hyperglycemia, without long-term current use of insulin (HCC) - Primary    Chronic, ongoing with A1C today 6.6% (downward trend),  plus urine ALB 10 and A:C <30 last visit.  Sugars are trending down at home.  Neuro goal <6.5%.  Continue  Metformin 1000 MG BID and continue Jardiance 10 MG, will increase Jardiance if A1C elevation noted next visit.  Recommend he check BS twice daily + continue focus on regular exercise and diet.  Return in 3 months for A1C check.      Relevant Medications   telmisartan (MICARDIS) 80 MG tablet   metFORMIN (GLUCOPHAGE) 1000 MG tablet   Other Relevant Orders   Bayer DCA Hb A1c Waived   Hyperlipidemia associated with type 2 diabetes mellitus (HCC)    Chronic, ongoing.  Continue ASA and maintaining tight lipid control, may need to increase Atorvastatin to 80 MG if ever elevation, recent LDL in 40's.  Continue current Atorvastatin dosing.  Lipid next visit.  Refills sent in.      Relevant Medications   telmisartan (MICARDIS) 80 MG tablet   metFORMIN (GLUCOPHAGE) 1000 MG tablet     Genitourinary   Prostate cancer [facility])    Currently undergoing IMRT, continue collaboration with oncology and urology.  Recent notes reviewed.      Relevant Medications   tamsulosin (FLOMAX) 0.4 MG CAPS capsule     Other   History of stroke    Continue collaboration with neurology due to significant stroke history.  Continue current medication regimen.      Seizures (HCC)    Being followed by neurology.  Continue current Keppra dose as prescribed by them and collaboration with neurology, reviewed notes.       Other Visit Diagnoses     B12 deficiency       Recent labs on low end of normal with long term Metformin use, recheck today and supplement as needed.   Relevant Orders   Vitamin B12       Follow up plan: Return in about 3 months (around 09/30/2020) for T2DM, HTN/HLD, ASTHMA.

## 2020-07-02 NOTE — Assessment & Plan Note (Signed)
Chronic, ongoing.  Stable at this time.  Continue current medication regimen and collaboration with pulmonary.

## 2020-07-02 NOTE — Assessment & Plan Note (Signed)
Chronic, ongoing.  Continue ASA and maintaining tight lipid control, may need to increase Atorvastatin to 80 MG if ever elevation, recent LDL in 40's.  Continue current Atorvastatin dosing.  Lipid next visit.  Refills sent in.

## 2020-07-02 NOTE — Assessment & Plan Note (Signed)
Continue 100% adherence to CPAP use.   

## 2020-07-02 NOTE — Assessment & Plan Note (Signed)
Being followed by neurology.  Continue current Keppra dose as prescribed by them and collaboration with neurology, reviewed notes.   

## 2020-07-02 NOTE — Assessment & Plan Note (Signed)
Chronic, ongoing with BP below neuro goal <130/90.  Continue current medication regimen and adjust as needed.  Recommend he monitor BP at home daily and document.  Continue focus on DASH diet and regular exercise at home.  BMP today.  Return in 3 months.

## 2020-07-03 ENCOUNTER — Other Ambulatory Visit: Payer: Self-pay | Admitting: Nurse Practitioner

## 2020-07-03 LAB — BASIC METABOLIC PANEL
BUN/Creatinine Ratio: 28 — ABNORMAL HIGH (ref 10–24)
BUN: 31 mg/dL — ABNORMAL HIGH (ref 8–27)
CO2: 24 mmol/L (ref 20–29)
Calcium: 9.3 mg/dL (ref 8.6–10.2)
Chloride: 102 mmol/L (ref 96–106)
Creatinine, Ser: 1.12 mg/dL (ref 0.76–1.27)
GFR calc Af Amer: 77 mL/min/{1.73_m2} (ref >59)
GFR calc non Af Amer: 67 mL/min/{1.73_m2} (ref >59)
Glucose: 107 mg/dL — ABNORMAL HIGH (ref 65–99)
Potassium: 4.8 mmol/L (ref 3.5–5.2)
Sodium: 141 mmol/L (ref 134–144)

## 2020-07-03 LAB — VITAMIN B12: Vitamin B-12: 276 pg/mL (ref 232–1245)

## 2020-07-03 MED ORDER — VITAMIN B-12 1000 MCG PO TABS: 1000.0000 ug | ORAL_TABLET | Freq: Every day | ORAL | 4 refills | Status: DC

## 2020-07-03 NOTE — Progress Notes (Signed)
Contacted via MyChart -- unsure he will get this so please call: Good afternoon Mr. Renbarger, your labs have returned and kidney function remains stable.  B12 level remains on low side.  I am going to send in some B12 for you to start taking daily, this is good for brain and overall nervous system health and can help with neuropathy type discomfort.  We will recheck level next visit.  If any questions let me know. Keep being awesome!!  Thank you for allowing me to participate in your care. Kindest regards, Ajia Chadderdon

## 2020-07-04 ENCOUNTER — Other Ambulatory Visit (HOSPITAL_COMMUNITY): Payer: PPO

## 2020-07-04 ENCOUNTER — Other Ambulatory Visit: Payer: Self-pay

## 2020-07-04 ENCOUNTER — Encounter (HOSPITAL_COMMUNITY)
Admission: RE | Admit: 2020-07-04 | Discharge: 2020-07-04 | Disposition: A | Discharge status: Home / Self Care | Payer: PPO | Source: Ambulatory Visit | Attending: Orthopedic Surgery | Admitting: Orthopedic Surgery

## 2020-07-04 ENCOUNTER — Encounter (HOSPITAL_COMMUNITY): Payer: Self-pay

## 2020-07-04 DIAGNOSIS — E119 Type 2 diabetes mellitus without complications: Secondary | ICD-10-CM | POA: Insufficient documentation

## 2020-07-04 DIAGNOSIS — Z79899 Other long term (current) drug therapy: Secondary | ICD-10-CM | POA: Diagnosis not present

## 2020-07-04 DIAGNOSIS — M75102 Unspecified rotator cuff tear or rupture of left shoulder, not specified as traumatic: Secondary | ICD-10-CM | POA: Diagnosis not present

## 2020-07-04 DIAGNOSIS — R569 Unspecified convulsions: Secondary | ICD-10-CM | POA: Diagnosis not present

## 2020-07-04 DIAGNOSIS — Z7982 Long term (current) use of aspirin: Secondary | ICD-10-CM | POA: Diagnosis not present

## 2020-07-04 DIAGNOSIS — Z8673 Personal history of transient ischemic attack (TIA), and cerebral infarction without residual deficits: Secondary | ICD-10-CM | POA: Insufficient documentation

## 2020-07-04 DIAGNOSIS — Z87891 Personal history of nicotine dependence: Secondary | ICD-10-CM | POA: Diagnosis not present

## 2020-07-04 DIAGNOSIS — I10 Essential (primary) hypertension: Secondary | ICD-10-CM | POA: Insufficient documentation

## 2020-07-04 DIAGNOSIS — Z7984 Long term (current) use of oral hypoglycemic drugs: Secondary | ICD-10-CM | POA: Diagnosis not present

## 2020-07-04 DIAGNOSIS — I2510 Atherosclerotic heart disease of native coronary artery without angina pectoris: Secondary | ICD-10-CM | POA: Diagnosis not present

## 2020-07-04 DIAGNOSIS — Z01812 Encounter for preprocedural laboratory examination: Secondary | ICD-10-CM | POA: Diagnosis not present

## 2020-07-04 LAB — BASIC METABOLIC PANEL
Anion gap: 10 (ref 5–15)
BUN: 31 mg/dL — ABNORMAL HIGH (ref 8–23)
CO2: 25 mmol/L (ref 22–32)
Calcium: 9.2 mg/dL (ref 8.9–10.3)
Chloride: 104 mmol/L (ref 98–111)
Creatinine, Ser: 1.06 mg/dL (ref 0.61–1.24)
GFR, Estimated: >60 mL/min (ref >60)
Glucose, Bld: 110 mg/dL — ABNORMAL HIGH (ref 70–99)
Potassium: 4.5 mmol/L (ref 3.5–5.1)
Sodium: 139 mmol/L (ref 135–145)

## 2020-07-04 LAB — CBC
HCT: 44.9 % (ref 39.0–52.0)
Hemoglobin: 14.3 g/dL (ref 13.0–17.0)
MCH: 32.3 pg (ref 26.0–34.0)
MCHC: 31.8 g/dL (ref 30.0–36.0)
MCV: 101.4 fL — ABNORMAL HIGH (ref 80.0–100.0)
Platelets: 150 10*3/uL (ref 150–400)
RBC: 4.43 MIL/uL (ref 4.22–5.81)
RDW: 12.1 % (ref 11.5–15.5)
WBC: 4.4 10*3/uL (ref 4.0–10.5)
nRBC: 0 % (ref 0.0–0.2)

## 2020-07-04 LAB — GLUCOSE, CAPILLARY: Glucose-Capillary: 89 mg/dL (ref 70–99)

## 2020-07-04 LAB — SURGICAL PCR SCREEN
MRSA, PCR: POSITIVE — AB
Staphylococcus aureus: POSITIVE — AB

## 2020-07-04 NOTE — Progress Notes (Signed)
COVID Vaccine Completed:Yes Date COVID Vaccine completed:01/25/20 COVID vaccine manufacturer:    Moderna     PCP - Aura Dials Cardiologist [facility]  Chest x-ray -  EKG - 09/08/19-epic Stress Test - 2013 ECHO - no Cardiac Cath - 1996 Pacemaker/ICD device last checked: Loop recorder  Sleep Study - yes CPAP - yes  Fasting Blood Sugar - 90-130 Checks Blood Sugar QD_____ times a day  Blood Thinner Instructions:ASA Aspirin Instructions:Stop 5 days prior to DOS Last Dose:12/29  Anesthesia review:   Patient denies shortness of breath, fever, cough and chest pain at PAT appointment yes  Patient verbalized understanding of instructions that were given to them at the PAT appointment. Patient was also instructed that they will need to review over the PAT instructions again at home before surgery. Yes. Pt can climb stairs,work around the house and ADLs without SOB

## 2020-07-05 ENCOUNTER — Other Ambulatory Visit: Payer: Self-pay | Admitting: Nurse Practitioner

## 2020-07-05 ENCOUNTER — Telehealth: Payer: Self-pay

## 2020-07-05 ENCOUNTER — Encounter (HOSPITAL_COMMUNITY): Payer: Self-pay | Admitting: Physician Assistant

## 2020-07-05 MED ORDER — ACYCLOVIR 400 MG PO TABS: 400.0000 mg | ORAL_TABLET | Freq: Two times a day (BID) | ORAL | 4 refills | Status: DC

## 2020-07-05 NOTE — Progress Notes (Signed)
Anesthesia Chart Review   Case: [id number] Date/Time: 07/12/20 0715   Procedure: REVERSE SHOULDER ARTHROPLASTY (Left Shoulder) - 149min   Anesthesia type: General   Pre-op diagnosis: Left shoulder rotator cuff tear arthropathy   Location: [facility]OM 06 / [facility]   Surgeons: Justice Britain, MD      DISCUSSION:69 y.o. 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 Justice Britain.   Pt last seen by neurology 05/09/2020. Per OV note pt continues on Keppra without recurrence of seizures. Stable at this visit with 1 year follow up recommended.    Cardiology clearance requested.  VS: BP 118/68   Pulse (!) 53   Resp 18   Ht 5\' 9"  (1.753 m)   Wt 87.7 kg   SpO2 100%   BMI 28.55 kg/m   PROVIDERS: [name], NP is PCP   LABS: Labs reviewed: Acceptable for surgery. (all labs ordered are listed, but only abnormal results are displayed)  Labs Reviewed  SURGICAL PCR SCREEN - Abnormal; Notable for the following components:      Result Value   MRSA, PCR POSITIVE (*)    Staphylococcus aureus POSITIVE (*)    All other components within normal limits  BASIC METABOLIC PANEL - Abnormal; Notable for the following components:   Glucose, Bld 110 (*)    BUN 31 (*)    All other components within normal limits  CBC - Abnormal; Notable for the following components:   MCV 101.4 (*)    All other components within normal limits  GLUCOSE, CAPILLARY  TYPE AND SCREEN     IMAGES:   EKG: 09/08/2019 Rate 92 bpm  NSR Inferior infarct, age undetermined Anterolateral infarct, age undetermined   CV: Echo 09/20/2018 IMPRESSIONS    1. The right ventricle has normal systolc function. The cavity was  normal. There is no increase in right ventricular wall thickness.  2. No LAA thormbus    No source of embolus.  3. Mild thickening of the mitral valve leaflet.  4. The aortic valve is tricuspid Mild thickening of the aortic valve  Aortic valve regurgitation is mild by color flow Doppler.  5. The aortic root is normal in size and structure.  6. The interatrial septum appears to be lipomatous.  7. The left ventricle has normal systolic function, with an ejection  fraction of 60-65%.  Past Medical History:  Diagnosis Date  . Arthritis   . Asthma    has not needed since 6/21  . Cancer [facility])    prostate  . Chronic venous insufficiency    varicose vein lower extremity with inflammation  . Coronary artery disease 1996   two stents placed   . Diabetes mellitus without complication ([place])    type 2 on metformin  . GERD (gastroesophageal reflux disease)    no issues since gastric bypass surgery as stated per pt  . Hyperlipidemia   . Hypertension   . Hypogonadism in male   . MRSA (methicillin resistant Staphylococcus aureus) infection    07/30/2008 thru 08/07/2008  . Sleep apnea    on BIPAP  . Stented coronary artery   . Stroke ([place]) 09/07/2019  . Thrombocythemia     Past Surgical History:  Procedure Laterality Date  . ANGIOPLASTY    . ANGIOPLASTY     with stent  04/07/1995  . ANTERIOR CERVICAL DECOMPRESSION/DISCECTOMY FUSION 4 LEVELS N/A 08/17/2017   Procedure: Anterior discectomy with fusion and plate fixation Cervical Three-Four, Four-Five, Five-Six, and Six-Seven Fusion;  Surgeon: Ditty, Loura Halt, MD;  Location[facility];  Service: Neurosurgery;  Laterality: N/A;  Anterior discectomy with fusion and plate fixation Cervical Three-Four, Four-Five, Five-Six, and Six-Seven Fusion   . APPENDECTOMY     1966  . BUBBLE STUDY  09/20/2018   Procedure: BUBBLE STUDY;  Surgeon: Wendall Stade, MD;  Location: [facility] ENDOSCOPY;  Service: Cardiovascular;;  . CARDIAC CATHETERIZATION  1996  . CARDIOVASCULAR STRESS TEST     07/31/2011  . CARPAL TUNNEL RELEASE Left   .  CHOLECYSTECTOMY     2006  . colonscopy      08/25/2012  . EYE SURGERY Bilateral    cataract  . FUNCTIONAL ENDOSCOPIC SINUS SURGERY     11/10/2013  . GASTRIC BYPASS     10/05/2012  . HERNIA REPAIR     left inguinal 1981  . INCISION AND DRAINAGE ABSCESS Right 02/26/2017   Procedure: INCISION AND DRAINAGE ABSCESS;  Surgeon: Hildred Laser, MD;  Location: [facility] ORS;  Service: Urology;  Laterality: Right;  . JOINT REPLACEMENT     bilateral  . left ankle surgery      05/03/2003   . left carpel tunnel      09/18/1993  . left knee meniscal tear      01/25/2010  . left knee meniscal tear repair      05/04/1996  . left rotator cuff repair      05/03/2003   . LOOP RECORDER INSERTION N/A 09/20/2018   Procedure: LOOP RECORDER INSERTION;  Surgeon: Marinus Maw, MD;  Location: [facility] INVASIVE CV LAB;  Service: Cardiovascular;  Laterality: N/A;  . REPLACEMENT TOTAL KNEE BILATERAL  07/13/2015  . right ankle surgery      fracture has 2 screws 07/07/1997  . right carpel tunnel      05/16/1992  . right shoulder replacement      01/27/2006  . SCROTAL EXPLORATION Right 02/26/2017   Procedure: SCROTUM EXPLORATION;  Surgeon: Hildred Laser, MD;  Location: [facility] ORS;  Service: Urology;  Laterality: Right;  . TEE WITHOUT CARDIOVERSION N/A 09/20/2018   Procedure: TRANSESOPHAGEAL ECHOCARDIOGRAM (TEE);  Surgeon: Wendall Stade, MD;  Location: [facility] ENDOSCOPY;  Service: Cardiovascular;  Laterality: N/A;  . TOTAL KNEE ARTHROPLASTY Left 07/13/2015   Procedure: LEFT TOTAL KNEE ARTHROPLASTY;  Surgeon: Ollen Gross, MD;  Location: [facility] ORS;  Service: Orthopedics;  Laterality: Left;    MEDICATIONS: . amitriptyline (ELAVIL) 25 MG tablet  . Ascorbic Acid (VITAMIN C) 1000 MG tablet  . aspirin EC 81 MG EC tablet  . atorvastatin (LIPITOR) 40 MG tablet  . cetirizine (ZYRTEC) 10 MG tablet  . Cholecalciferol (VITAMIN D) 2000 units CAPS  . empagliflozin (JARDIANCE) 10 MG TABS tablet  . Lancets (ONETOUCH ULTRASOFT)  lancets  . levETIRAcetam (KEPPRA) 500 MG tablet  . metFORMIN (GLUCOPHAGE) 1000 MG tablet  . Multiple Vitamins-Minerals (MULTIVITAMIN PO)  . ONETOUCH ULTRA test strip  . tamsulosin (FLOMAX) 0.4 MG CAPS capsule  . telmisartan (MICARDIS) 80 MG tablet  . valACYclovir (VALTREX) 500 MG tablet  . vitamin B-12 (CYANOCOBALAMIN) 1000 MCG tablet   No current facility-administered medications for this encounter.

## 2020-07-05 NOTE — Telephone Encounter (Signed)
Patients wife notified

## 2020-07-05 NOTE — Telephone Encounter (Signed)
Copied from CRM [id number]. Topic: General - Other >> Jul 05, 2020 11:14 AM Dalphine Handing A wrote: Patient wife is requesting patients valACYclovir (VALTREX) 500 MG tablet  medication be changed to cyclovir instead due to the costs going up for medication. Please advise

## 2020-07-05 NOTE — Telephone Encounter (Signed)
Can this be done?

## 2020-07-05 NOTE — Telephone Encounter (Signed)
Have sent this to mail order, alert her dosing will be a bit different at 400 MG twice daily due to change in medication.

## 2020-07-09 ENCOUNTER — Other Ambulatory Visit (HOSPITAL_COMMUNITY)
Admission: RE | Admit: 2020-07-09 | Discharge: 2020-07-09 | Disposition: A | Discharge status: Home / Self Care | Payer: PPO | Source: Ambulatory Visit | Attending: Orthopedic Surgery | Admitting: Orthopedic Surgery

## 2020-07-09 DIAGNOSIS — Z01812 Encounter for preprocedural laboratory examination: Secondary | ICD-10-CM | POA: Diagnosis not present

## 2020-07-09 DIAGNOSIS — Z20822 Contact with and (suspected) exposure to covid-19: Secondary | ICD-10-CM | POA: Diagnosis not present

## 2020-07-09 LAB — SARS CORONAVIRUS 2 (TAT 6-24 HRS): SARS Coronavirus 2: NEGATIVE

## 2020-07-10 ENCOUNTER — Other Ambulatory Visit: Payer: Self-pay

## 2020-07-10 ENCOUNTER — Emergency Department: Payer: PPO

## 2020-07-10 ENCOUNTER — Inpatient Hospital Stay
Admission: EM | Admit: 2020-07-10 | Discharge: 2020-07-13 | DRG: 682 | Disposition: A | Discharge status: Home / Self Care | Payer: PPO | Attending: Internal Medicine | Admitting: Internal Medicine

## 2020-07-10 DIAGNOSIS — I10 Essential (primary) hypertension: Secondary | ICD-10-CM | POA: Diagnosis present

## 2020-07-10 DIAGNOSIS — E1165 Type 2 diabetes mellitus with hyperglycemia: Secondary | ICD-10-CM | POA: Diagnosis not present

## 2020-07-10 DIAGNOSIS — I959 Hypotension, unspecified: Secondary | ICD-10-CM | POA: Diagnosis not present

## 2020-07-10 DIAGNOSIS — R579 Shock, unspecified: Secondary | ICD-10-CM | POA: Diagnosis not present

## 2020-07-10 DIAGNOSIS — Z83438 Family history of other disorder of lipoprotein metabolism and other lipidemia: Secondary | ICD-10-CM

## 2020-07-10 DIAGNOSIS — E872 Acidosis: Secondary | ICD-10-CM | POA: Diagnosis present

## 2020-07-10 DIAGNOSIS — G40909 Epilepsy, unspecified, not intractable, without status epilepticus: Secondary | ICD-10-CM | POA: Diagnosis present

## 2020-07-10 DIAGNOSIS — Z8614 Personal history of Methicillin resistant Staphylococcus aureus infection: Secondary | ICD-10-CM

## 2020-07-10 DIAGNOSIS — D696 Thrombocytopenia, unspecified: Secondary | ICD-10-CM | POA: Diagnosis present

## 2020-07-10 DIAGNOSIS — Z8249 Family history of ischemic heart disease and other diseases of the circulatory system: Secondary | ICD-10-CM

## 2020-07-10 DIAGNOSIS — E861 Hypovolemia: Secondary | ICD-10-CM | POA: Diagnosis not present

## 2020-07-10 DIAGNOSIS — E1169 Type 2 diabetes mellitus with other specified complication: Secondary | ICD-10-CM | POA: Diagnosis not present

## 2020-07-10 DIAGNOSIS — Z9884 Bariatric surgery status: Secondary | ICD-10-CM

## 2020-07-10 DIAGNOSIS — E860 Dehydration: Secondary | ICD-10-CM | POA: Diagnosis present

## 2020-07-10 DIAGNOSIS — Z20822 Contact with and (suspected) exposure to covid-19: Secondary | ICD-10-CM | POA: Diagnosis not present

## 2020-07-10 DIAGNOSIS — R571 Hypovolemic shock: Secondary | ICD-10-CM | POA: Diagnosis not present

## 2020-07-10 DIAGNOSIS — R197 Diarrhea, unspecified: Secondary | ICD-10-CM | POA: Diagnosis not present

## 2020-07-10 DIAGNOSIS — E291 Testicular hypofunction: Secondary | ICD-10-CM | POA: Diagnosis not present

## 2020-07-10 DIAGNOSIS — J45909 Unspecified asthma, uncomplicated: Secondary | ICD-10-CM | POA: Diagnosis not present

## 2020-07-10 DIAGNOSIS — I872 Venous insufficiency (chronic) (peripheral): Secondary | ICD-10-CM | POA: Diagnosis present

## 2020-07-10 DIAGNOSIS — K219 Gastro-esophageal reflux disease without esophagitis: Secondary | ICD-10-CM | POA: Diagnosis not present

## 2020-07-10 DIAGNOSIS — Z823 Family history of stroke: Secondary | ICD-10-CM

## 2020-07-10 DIAGNOSIS — G4733 Obstructive sleep apnea (adult) (pediatric): Secondary | ICD-10-CM | POA: Diagnosis not present

## 2020-07-10 DIAGNOSIS — Z8673 Personal history of transient ischemic attack (TIA), and cerebral infarction without residual deficits: Secondary | ICD-10-CM

## 2020-07-10 DIAGNOSIS — M1990 Unspecified osteoarthritis, unspecified site: Secondary | ICD-10-CM | POA: Diagnosis not present

## 2020-07-10 DIAGNOSIS — I2510 Atherosclerotic heart disease of native coronary artery without angina pectoris: Secondary | ICD-10-CM | POA: Diagnosis present

## 2020-07-10 DIAGNOSIS — Z7982 Long term (current) use of aspirin: Secondary | ICD-10-CM

## 2020-07-10 DIAGNOSIS — R410 Disorientation, unspecified: Secondary | ICD-10-CM | POA: Diagnosis not present

## 2020-07-10 DIAGNOSIS — R0902 Hypoxemia: Secondary | ICD-10-CM | POA: Diagnosis not present

## 2020-07-10 DIAGNOSIS — Z79899 Other long term (current) drug therapy: Secondary | ICD-10-CM

## 2020-07-10 DIAGNOSIS — R000 Tachycardia, unspecified: Secondary | ICD-10-CM | POA: Diagnosis not present

## 2020-07-10 DIAGNOSIS — Z981 Arthrodesis status: Secondary | ICD-10-CM | POA: Diagnosis not present

## 2020-07-10 DIAGNOSIS — J101 Influenza due to other identified influenza virus with other respiratory manifestations: Secondary | ICD-10-CM | POA: Diagnosis not present

## 2020-07-10 DIAGNOSIS — Z7984 Long term (current) use of oral hypoglycemic drugs: Secondary | ICD-10-CM | POA: Diagnosis not present

## 2020-07-10 DIAGNOSIS — E785 Hyperlipidemia, unspecified: Secondary | ICD-10-CM | POA: Diagnosis not present

## 2020-07-10 DIAGNOSIS — E119 Type 2 diabetes mellitus without complications: Secondary | ICD-10-CM

## 2020-07-10 DIAGNOSIS — N179 Acute kidney failure, unspecified: Secondary | ICD-10-CM | POA: Diagnosis present

## 2020-07-10 DIAGNOSIS — Z87891 Personal history of nicotine dependence: Secondary | ICD-10-CM

## 2020-07-10 DIAGNOSIS — Z833 Family history of diabetes mellitus: Secondary | ICD-10-CM

## 2020-07-10 DIAGNOSIS — Z881 Allergy status to other antibiotic agents status: Secondary | ICD-10-CM

## 2020-07-10 DIAGNOSIS — Z8546 Personal history of malignant neoplasm of prostate: Secondary | ICD-10-CM | POA: Diagnosis not present

## 2020-07-10 DIAGNOSIS — Z882 Allergy status to sulfonamides status: Secondary | ICD-10-CM

## 2020-07-10 DIAGNOSIS — R231 Pallor: Secondary | ICD-10-CM | POA: Diagnosis not present

## 2020-07-10 DIAGNOSIS — Z955 Presence of coronary angioplasty implant and graft: Secondary | ICD-10-CM

## 2020-07-10 DIAGNOSIS — R0689 Other abnormalities of breathing: Secondary | ICD-10-CM | POA: Diagnosis not present

## 2020-07-10 DIAGNOSIS — N400 Enlarged prostate without lower urinary tract symptoms: Secondary | ICD-10-CM | POA: Diagnosis not present

## 2020-07-10 DIAGNOSIS — K5730 Diverticulosis of large intestine without perforation or abscess without bleeding: Secondary | ICD-10-CM | POA: Diagnosis not present

## 2020-07-10 LAB — CBC WITH DIFFERENTIAL/PLATELET
Abs Immature Granulocytes: 0.03 10*3/uL (ref 0.00–0.07)
Basophils Absolute: 0 10*3/uL (ref 0.0–0.1)
Basophils Relative: 0 %
Eosinophils Absolute: 0 10*3/uL (ref 0.0–0.5)
Eosinophils Relative: 0 %
HCT: 39.4 % (ref 39.0–52.0)
Hemoglobin: 12.3 g/dL — ABNORMAL LOW (ref 13.0–17.0)
Immature Granulocytes: 0 %
Lymphocytes Relative: 6 %
Lymphs Abs: 0.6 10*3/uL — ABNORMAL LOW (ref 0.7–4.0)
MCH: 31.9 pg (ref 26.0–34.0)
MCHC: 31.2 g/dL (ref 30.0–36.0)
MCV: 102.1 fL — ABNORMAL HIGH (ref 80.0–100.0)
Monocytes Absolute: 1 10*3/uL (ref 0.1–1.0)
Monocytes Relative: 10 %
Neutro Abs: 8.3 10*3/uL — ABNORMAL HIGH (ref 1.7–7.7)
Neutrophils Relative %: 84 %
Platelets: 106 10*3/uL — ABNORMAL LOW (ref 150–400)
RBC: 3.86 MIL/uL — ABNORMAL LOW (ref 4.22–5.81)
RDW: 12.7 % (ref 11.5–15.5)
WBC: 10 10*3/uL (ref 4.0–10.5)
nRBC: 0 % (ref 0.0–0.2)

## 2020-07-10 LAB — COMPREHENSIVE METABOLIC PANEL
ALT: 37 U/L (ref 0–44)
AST: 49 U/L — ABNORMAL HIGH (ref 15–41)
Albumin: 3.5 g/dL (ref 3.5–5.0)
Alkaline Phosphatase: 52 U/L (ref 38–126)
Anion gap: 15 (ref 5–15)
BUN: 25 mg/dL — ABNORMAL HIGH (ref 8–23)
CO2: 18 mmol/L — ABNORMAL LOW (ref 22–32)
Calcium: 8 mg/dL — ABNORMAL LOW (ref 8.9–10.3)
Chloride: 106 mmol/L (ref 98–111)
Creatinine, Ser: 1.92 mg/dL — ABNORMAL HIGH (ref 0.61–1.24)
GFR, Estimated: 37 mL/min — ABNORMAL LOW (ref >60)
Glucose, Bld: 189 mg/dL — ABNORMAL HIGH (ref 70–99)
Potassium: 4.3 mmol/L (ref 3.5–5.1)
Sodium: 139 mmol/L (ref 135–145)
Total Bilirubin: 1.2 mg/dL (ref 0.3–1.2)
Total Protein: 6.2 g/dL — ABNORMAL LOW (ref 6.5–8.1)

## 2020-07-10 LAB — APTT: aPTT: 37 seconds — ABNORMAL HIGH (ref 24–36)

## 2020-07-10 LAB — LACTIC ACID, PLASMA: Lactic Acid, Venous: 7 mmol/L (ref 0.5–1.9)

## 2020-07-10 LAB — PROTIME-INR
INR: 1.2 (ref 0.8–1.2)
Prothrombin Time: 14.6 seconds (ref 11.4–15.2)

## 2020-07-10 MED ORDER — SODIUM CHLORIDE 0.9 % IV BOLUS (SEPSIS)
1500.0000 mL | Freq: Once | INTRAVENOUS | Status: AC
  Administered 2020-07-10: 1500 mL via INTRAVENOUS

## 2020-07-10 MED ORDER — LACTATED RINGERS IV BOLUS
1000.0000 mL | Freq: Once | INTRAVENOUS | Status: AC
  Administered 2020-07-11: 1000 mL via INTRAVENOUS

## 2020-07-10 MED ORDER — PIPERACILLIN-TAZOBACTAM 3.375 G IVPB 30 MIN
3.3750 g | Freq: Once | INTRAVENOUS | Status: AC
  Administered 2020-07-10: 3.375 g via INTRAVENOUS
  Filled 2020-07-10: qty 50

## 2020-07-10 MED ORDER — VANCOMYCIN HCL IN DEXTROSE 1-5 GM/200ML-% IV SOLN
1000.0000 mg | Freq: Once | INTRAVENOUS | Status: AC
  Administered 2020-07-11: 1000 mg via INTRAVENOUS
  Filled 2020-07-10: qty 200

## 2020-07-10 NOTE — ED Provider Notes (Incomplete)
11:00 PM  Assumed care.  Patient here with diarrhea.  Here with hypotension.  Labs pending.  Getting IVF and abx.  Needs admission.  12:00 AM  Pt's lactate has come back elevated at 7.0.  I have broaden his antibiotic coverage.  He is already received Zosyn.  Will give vancomycin as well.  Will give additional IV fluids.  His blood pressure improving with IV fluids.  Labs also show acute renal failure.  Suspect due to dehydration.  He denies having any cough, chest pain, shortness of breath, sore throat.  Denies abdominal pain.  No vomiting.  No bloody stools or melena.  Stool studies pending.  Chest x-ray clear.  1:50 AM  Pt's blood pressure is now 102/60.  CT of the abdomen pelvis shows no acute abnormality.  Urine, repeat lactic and Covid test pending.  I feel patient at this time is safe for admission to a stepdown bed.   2:06 AM Discussed patient's case with hositalist, Dr. Arville Care.  He would like critical care to be involved with this patient as he feels patient may need pressors.  I reviewed all nursing notes, vitals, pertinent previous records and reviewed/interpreted all EKGs, lab and urine results, imaging (as available).   2:30 AM  On my re-eval, patient resting comfortably.  HR in 80s.  SBP in 90s x 3.  MAP currently 70 after 2.5 L IVF.  Will give another 1L for 60ml/kg IVF bolus.  Lactic improving.  Urine shows no infection.  COVID negative per nurse.    2:38 AM  Spoke with Jeri Modena NP with critical care who will admit to SDU.  I reviewed all nursing notes and pertinent previous records as available.  I have reviewed and interpreted any EKGs, lab and urine results, imaging (as available).   4:08 AM  Pt's PCR Covid test is negative.  He has come back positive for influenza A.  Updated ICU team.  Will order Tamiflu.  CRITICAL CARE Performed by: Rochele Raring   Total critical care time: 45 minutes  Critical care time was exclusive of separately billable procedures and treating  other patients.  Critical care was necessary to treat or prevent imminent or life-threatening deterioration.  Critical care was time spent personally by me on the following activities: development of treatment plan with patient and/or surrogate as well as nursing, discussions with consultants, evaluation of patient's response to treatment, examination of patient, obtaining history from patient or surrogate, ordering and performing treatments and interventions, ordering and review of laboratory studies, ordering and review of radiographic studies, pulse oximetry and re-evaluation of patient's condition.      Loveta Dellis, Layla Maw, DO 07/11/20 0238    Patte Winkel, Layla Maw, DO 07/11/20 [phone]

## 2020-07-10 NOTE — ED Triage Notes (Signed)
Patient CO of hypotension, HX of TIA and planning shoulder surgery Left

## 2020-07-10 NOTE — Sepsis Progress Note (Signed)
Monitoring for sepsis protocol. °

## 2020-07-10 NOTE — Progress Notes (Signed)
Carelink Summary Report / Loop Recorder 

## 2020-07-10 NOTE — ED Provider Notes (Signed)
[facility] Emergency Department Provider Note  ____________________________________________  Time seen: Approximately 10:45 PM  I have reviewed the triage vital signs and the nursing notes.   HISTORY  Chief Complaint Hypotension    Level 5 Caveat: Portions of the History and Physical including HPI and review of systems are unable to be completely obtained due to acute critical illness  HPI Andrew Cobb is a 70 y.o. male with a history of prostate cancer, CAD, diabetes, GERD, TIA who is brought to the ED due to  altered mental status, confusion today.  No recent trauma.  EMS report the patient was having copious frequent diarrhea on their arrival and patient's blood pressure was 60 systolic.  EMS gave a total of 1500 mL of saline bolus, which increased blood pressure up to about 90/40.  Patient denies any pain or recent hospitalization or antibiotic use.  No blood thinner use.  Denies black or bloody stool.     Past Medical History:  Diagnosis Date  . Arthritis   . Asthma    has not needed since 6/21  . Cancer [facility])    prostate  . Chronic venous insufficiency    varicose vein lower extremity with inflammation  . Coronary artery disease 1996   two stents placed   . Diabetes mellitus without complication ([place])    type 2 on metformin  . GERD (gastroesophageal reflux disease)    no issues since gastric bypass surgery as stated per pt  . Hyperlipidemia   . Hypertension   . Hypogonadism in male   . MRSA (methicillin resistant Staphylococcus aureus) infection    07/30/2008 thru 08/07/2008  . Sleep apnea    on BIPAP  . Stented coronary artery   . Stroke ([place]) 09/07/2019  . Thrombocythemia      Patient Active Problem List   Diagnosis Date Noted  . Serous detachment of retinal pigment epithelium of right eye 01/31/2020  . Pseudophakia 01/31/2020  . Exudative age-related macular degeneration of right eye with active choroidal neovascularization  ([place]) 01/31/2020  . Situational depression 10/18/2019  . Seizures ([place]) 10/18/2019  . Hyperlipidemia associated with type 2 diabetes mellitus ([place]) 06/19/2019  . Expressive aphasia 06/19/2019  . Vitamin D deficiency 06/19/2019  . Chronic venous insufficiency   . Asthma   . Arthritis   . GERD (gastroesophageal reflux disease)   . Hypogonadism in male   . History of stroke   . H/O right coronary artery stent placement   . Allergic rhinitis 08/19/2018  . Cervical radiculopathy 08/17/2017  . Advanced care planning/counseling discussion 07/21/2017  . Prostate cancer ([place]) 12/08/2016  . H/O bariatric surgery 07/17/2016  . Hypertension associated with diabetes ([place]) 04/24/2015  . Uncontrolled type 2 diabetes mellitus with hyperglycemia, without long-term current use of insulin ([place]) 04/24/2015  . Chronic sinusitis 08/13/2009  . Coronary atherosclerosis 04/02/2009  . OSA (obstructive sleep apnea) 04/02/2009  . Coronary artery disease 1996     Past Surgical History:  Procedure Laterality Date  . ANGIOPLASTY    . ANGIOPLASTY     with stent 04/07/1995  . ANTERIOR CERVICAL DECOMPRESSION/DISCECTOMY FUSION 4 LEVELS N/A 08/17/2017   Procedure: Anterior discectomy with fusion and plate fixation Cervical Three-Four, Four-Five, Five-Six, and Six-Seven Fusion;  Surgeon: Ditty, Kevan Ny, MD;  Location: [place];  Service: Neurosurgery;  Laterality: N/A;  Anterior discectomy with fusion and plate fixation Cervical Three-Four, Four-Five, Five-Six, and Six-Seven Fusion   . APPENDECTOMY     1966  . BUBBLE STUDY  09/20/2018   Procedure: BUBBLE STUDY;  Surgeon: Josue Hector, MD;  Location: [facility] ENDOSCOPY;  Service: Cardiovascular;;  . [place]  . CARDIOVASCULAR STRESS TEST     07/31/2011  . CARPAL TUNNEL RELEASE Left   . CHOLECYSTECTOMY     2006  . colonscopy      08/25/2012  . EYE SURGERY Bilateral    cataract  . FUNCTIONAL ENDOSCOPIC SINUS SURGERY     11/10/2013  .  GASTRIC BYPASS     10/05/2012  . HERNIA REPAIR     left inguinal 1981  . INCISION AND DRAINAGE ABSCESS Right 02/26/2017   Procedure: INCISION AND DRAINAGE ABSCESS;  Surgeon: Nickie Retort, MD;  Location: [facility] ORS;  Service: Urology;  Laterality: Right;  . JOINT REPLACEMENT     bilateral  . left ankle surgery      05/03/2003   . left carpel tunnel      09/18/1993  . left knee meniscal tear      01/25/2010  . left knee meniscal tear repair      05/04/1996  . left rotator cuff repair      05/03/2003   . LOOP RECORDER INSERTION N/A 09/20/2018   Procedure: LOOP RECORDER INSERTION;  Surgeon: Evans Lance, MD;  Location: [place] CV LAB;  Service: Cardiovascular;  Laterality: N/A;  . REPLACEMENT TOTAL KNEE BILATERAL  07/13/2015  . right ankle surgery      fracture has 2 screws 07/07/1997  . right carpel tunnel      05/16/1992  . right shoulder replacement      01/27/2006  . SCROTAL EXPLORATION Right 02/26/2017   Procedure: SCROTUM EXPLORATION;  Surgeon: Nickie Retort, MD;  Location: [facility] ORS;  Service: Urology;  Laterality: Right;  . TEE WITHOUT CARDIOVERSION N/A 09/20/2018   Procedure: TRANSESOPHAGEAL ECHOCARDIOGRAM (TEE);  Surgeon: Josue Hector, MD;  Location: [facility] ENDOSCOPY;  Service: Cardiovascular;  Laterality: N/A;  . TOTAL KNEE ARTHROPLASTY Left 07/13/2015   Procedure: LEFT TOTAL KNEE ARTHROPLASTY;  Surgeon: Gaynelle Arabian, MD;  Location: [facility] ORS;  Service: Orthopedics;  Laterality: Left;     Prior to Admission medications   Medication Sig Start Date End Date Taking? Authorizing Provider  acyclovir (ZOVIRAX) 400 MG tablet Take 1 tablet (400 mg total) by mouth 2 (two) times daily. 07/05/20   [name], Henrine Screws T, NP  amitriptyline (ELAVIL) 25 MG tablet Take 1 tablet (25 mg total) by mouth at bedtime. 03/20/20   [name], Henrine Screws T, NP  Ascorbic Acid (VITAMIN C) 1000 MG tablet Take 1,000 mg by mouth daily. Patient not taking: Reported on 07/02/2020    [provider]   aspirin EC 81 MG EC tablet Take 1 tablet (81 mg total) by mouth daily. 02/11/19   Debbe Odea, MD  atorvastatin (LIPITOR) 40 MG tablet Take 1 tablet (40 mg total) by mouth at bedtime. 03/20/20   [name], Henrine Screws T, NP  cetirizine (ZYRTEC) 10 MG tablet Take 10 mg by mouth daily.    [provider]  Cholecalciferol (VITAMIN D) 2000 units CAPS Take 2,000 Units by mouth daily.    [provider]  empagliflozin (JARDIANCE) 10 MG TABS tablet Take 10 mg by mouth daily before breakfast. 07/20/19   [name], Henrine Screws T, NP  Lancets (ONETOUCH ULTRASOFT) lancets 1 each by Other route 2 (two) times daily. Dx E11.9 10/04/19   Volney American, PA-C  levETIRAcetam (KEPPRA) 500 MG tablet Take 1 tablet (500 mg total) by mouth 2 (two) times  daily. 05/09/20   [name], NP  metFORMIN (GLUCOPHAGE) 1000 MG tablet Take 1 tablet (1,000 mg total) by mouth 2 (two) times daily with a meal. 07/02/20   [name], Henrine Screws T, NP  Multiple Vitamins-Minerals (MULTIVITAMIN PO) Take 1 tablet by mouth daily.     [provider]  [facility] ULTRA test strip USE TWICE DAILY 01/13/20   Park Liter P, DO  tamsulosin (FLOMAX) 0.4 MG CAPS capsule Take 1 capsule (0.4 mg total) by mouth at bedtime. 07/02/20   [name], Henrine Screws T, NP  telmisartan (MICARDIS) 80 MG tablet Take 0.5 tablets (40 mg total) by mouth daily. 07/02/20   [name], Henrine Screws T, NP  vitamin B-12 (CYANOCOBALAMIN) 1000 MCG tablet Take 1 tablet (1,000 mcg total) by mouth daily. 07/03/20   Marnee Guarneri T, NP     Allergies Succinylsulphathiazole, Sulfamethoxazole-trimethoprim, and Tetracyclines & related   Family History  Problem Relation Age of Onset  . Cancer Mother        pancreatic  . Diabetes Mother   . Stroke Mother   . Heart disease Mother   . Hyperlipidemia Mother   . Hypertension Mother   . Heart attack Mother   . Heart disease Father   . Stroke Father   . Diabetes Father   . Hypertension Father   . Heart attack Father   .  Hyperlipidemia Father   . Pancreatic cancer Father   . Cancer Sister   . Cancer Brother        lung  . Cancer Brother   . Kidney cancer Neg Hx   . Bladder Cancer Neg Hx   . Prostate cancer Neg Hx     Social History Social History   Tobacco Use  . Smoking status: Former Smoker    Packs/day: 1.00    Years: 10.00    Pack years: 10.00    Types: Cigarettes    Quit date: 07/07/1984    Years since quitting: 36.0  . Smokeless tobacco: Never Used  . Tobacco comment: quit 1986  Vaping Use  . Vaping Use: Never used  Substance Use Topics  . Alcohol use: No    Alcohol/week: 0.0 standard drinks  . Drug use: No    Review of Systems Level 5 Caveat: Portions of the History and Physical including HPI and review of systems are unable to be completely obtained due to patient being a poor historian   Constitutional:   No known fever.  ENT:   No rhinorrhea. Cardiovascular:   No chest pain or syncope. Respiratory:   No dyspnea or cough. Gastrointestinal:   Negative for abdominal pain, positive diarrhea Musculoskeletal:   Negative for focal pain or swelling ____________________________________________   PHYSICAL EXAM:  VITAL SIGNS: ED Triage Vitals  Enc Vitals Group     BP 07/10/20 2236 (!) 96/45     Pulse Rate 07/10/20 2236 (!) 107     Resp 07/10/20 2236 16     Temp 07/10/20 2236 97.8 F (36.6 C)     Temp Source 07/10/20 2236 Oral     SpO2 07/10/20 2235 98 %     Weight --      Height --      Head Circumference --      Peak Flow --      Pain Score 07/10/20 2238 0     Pain Loc --      Pain Edu? --      Excl. in [place]? --     Vital signs reviewed, nursing  assessments reviewed.   Constitutional:   Alert and oriented.  Ill-appearing. Eyes:   Conjunctivae are normal. EOMI. PERRL. ENT      Head:   Normocephalic and atraumatic.      Nose:   No congestion/rhinnorhea.       Mouth/Throat:   Dry mucous membranes, no pharyngeal erythema. No peritonsillar mass.       Neck:   No  meningismus. Full ROM. Hematological/Lymphatic/Immunilogical:   No cervical lymphadenopathy. Cardiovascular:   Tachycardia heart rate 110. Symmetric bilateral radial and DP pulses.  No murmurs. Cap refill less than 2 seconds. Respiratory:   Normal respiratory effort without tachypnea/retractions. Breath sounds are clear and equal bilaterally. No wheezes/rales/rhonchi. Gastrointestinal:   Soft and nontender. Non distended.   No rebound, rigidity, or guarding.  Watery brown stool, not melanotic or bloody. Musculoskeletal:   Normal range of motion in all extremities. No joint effusions.  No lower extremity tenderness.  No edema. Neurologic:   Normal speech and language.  Motor grossly intact. No acute focal neurologic deficits are appreciated.  Skin:    Skin is warm, dry and intact. No rash noted.  No petechiae, purpura, or bullae.  ____________________________________________    LABS (pertinent positives/negatives) (all labs ordered are listed, but only abnormal results are displayed) Labs Reviewed  CULTURE, BLOOD (ROUTINE X 2)  CULTURE, BLOOD (ROUTINE X 2)  URINE CULTURE  GASTROINTESTINAL PANEL BY PCR, STOOL (REPLACES STOOL CULTURE)  C DIFFICILE QUICK SCREEN W PCR REFLEX  LACTIC ACID, PLASMA  LACTIC ACID, PLASMA  COMPREHENSIVE METABOLIC PANEL  CBC WITH DIFFERENTIAL/PLATELET  PROTIME-INR  APTT  URINALYSIS, COMPLETE (UACMP) WITH MICROSCOPIC  POC SARS CORONAVIRUS 2 AG -  ED   ____________________________________________   EKG  Interpreted by me Sinus tachycardia rate 104.  Normal axis and intervals.  Poor R wave progression.  Normal ST segments and T waves.  ____________________________________________    RADIOLOGY  No results found.  ____________________________________________   PROCEDURES .Critical Care Performed by: Sharman Cheek, MD Authorized by: Sharman Cheek, MD   Critical care provider statement:    Critical care time (minutes):  35   Critical  care time was exclusive of:  Separately billable procedures and treating other patients   Critical care was necessary to treat or prevent imminent or life-threatening deterioration of the following conditions:  Sepsis, shock and dehydration   Critical care was time spent personally by me on the following activities:  Development of treatment plan with patient or surrogate, discussions with consultants, evaluation of patient's response to treatment, examination of patient, obtaining history from patient or surrogate, ordering and performing treatments and interventions, ordering and review of laboratory studies, ordering and review of radiographic studies, pulse oximetry, re-evaluation of patient's condition and review of old charts    ____________________________________________  DIFFERENTIAL DIAGNOSIS   Viral illness, dehydration, septic shock, UTI, colitis, electrolyte abnormality  CLINICAL IMPRESSION / ASSESSMENT AND PLAN / ED COURSE  Medications ordered in the ED: Medications  sodium chloride 0.9 % bolus 1,500 mL (1,500 mLs Intravenous New Bag/Given 07/10/20 2240)  piperacillin-tazobactam (ZOSYN) IVPB 3.375 g (has no administration in time range)    Pertinent labs & imaging results that were available during my care of the patient were reviewed by me and considered in my medical decision making (see chart for details).   Andrew Cobb was evaluated in Emergency Department on 07/10/2020 for the symptoms described in the history of present illness. He was evaluated in the context of the global COVID-19 pandemic, which  necessitated consideration that the patient might be at risk for infection with the SARS-CoV-2 virus that causes COVID-19. Institutional protocols and algorithms that pertain to the evaluation of patients at risk for COVID-19 are in a state of rapid change based on information released by regulatory bodies including the CDC and federal and state organizations. These policies and  algorithms were followed during the patient's care in the ED.   Patient presents with diarrhea hypotension tachycardia concerning for distributive or hypovolemic shock.  We will continue IV fluid bolus total of 3 L saline, check sepsis panel, empiric Zosyn, Covid test and stool studies.      ____________________________________________   FINAL CLINICAL IMPRESSION(S) / ED DIAGNOSES    Final diagnoses:  Shock ([place])  Type 2 diabetes mellitus without complication, without long-term current use of insulin [facility])     ED Discharge Orders    None      Portions of this note were generated with dragon dictation software. Dictation errors may occur despite best attempts at proofreading.   Carrie Mew, MD 07/10/20 2259

## 2020-07-11 ENCOUNTER — Emergency Department: Payer: PPO

## 2020-07-11 ENCOUNTER — Encounter: Payer: Self-pay | Admitting: Radiology

## 2020-07-11 DIAGNOSIS — I2510 Atherosclerotic heart disease of native coronary artery without angina pectoris: Secondary | ICD-10-CM | POA: Diagnosis present

## 2020-07-11 DIAGNOSIS — Z20822 Contact with and (suspected) exposure to covid-19: Secondary | ICD-10-CM | POA: Diagnosis present

## 2020-07-11 DIAGNOSIS — E860 Dehydration: Secondary | ICD-10-CM | POA: Diagnosis not present

## 2020-07-11 DIAGNOSIS — R571 Hypovolemic shock: Secondary | ICD-10-CM | POA: Diagnosis present

## 2020-07-11 DIAGNOSIS — E861 Hypovolemia: Secondary | ICD-10-CM | POA: Diagnosis not present

## 2020-07-11 DIAGNOSIS — E872 Acidosis: Secondary | ICD-10-CM | POA: Diagnosis present

## 2020-07-11 DIAGNOSIS — E1169 Type 2 diabetes mellitus with other specified complication: Secondary | ICD-10-CM | POA: Diagnosis present

## 2020-07-11 DIAGNOSIS — Z7984 Long term (current) use of oral hypoglycemic drugs: Secondary | ICD-10-CM | POA: Diagnosis not present

## 2020-07-11 DIAGNOSIS — I872 Venous insufficiency (chronic) (peripheral): Secondary | ICD-10-CM | POA: Diagnosis present

## 2020-07-11 DIAGNOSIS — G4733 Obstructive sleep apnea (adult) (pediatric): Secondary | ICD-10-CM | POA: Diagnosis present

## 2020-07-11 DIAGNOSIS — Z981 Arthrodesis status: Secondary | ICD-10-CM | POA: Diagnosis not present

## 2020-07-11 DIAGNOSIS — E785 Hyperlipidemia, unspecified: Secondary | ICD-10-CM | POA: Diagnosis present

## 2020-07-11 DIAGNOSIS — J45909 Unspecified asthma, uncomplicated: Secondary | ICD-10-CM | POA: Diagnosis present

## 2020-07-11 DIAGNOSIS — M1990 Unspecified osteoarthritis, unspecified site: Secondary | ICD-10-CM | POA: Diagnosis present

## 2020-07-11 DIAGNOSIS — K219 Gastro-esophageal reflux disease without esophagitis: Secondary | ICD-10-CM | POA: Diagnosis present

## 2020-07-11 DIAGNOSIS — G40909 Epilepsy, unspecified, not intractable, without status epilepticus: Secondary | ICD-10-CM | POA: Diagnosis present

## 2020-07-11 DIAGNOSIS — J101 Influenza due to other identified influenza virus with other respiratory manifestations: Secondary | ICD-10-CM | POA: Diagnosis present

## 2020-07-11 DIAGNOSIS — R579 Shock, unspecified: Secondary | ICD-10-CM | POA: Diagnosis present

## 2020-07-11 DIAGNOSIS — E291 Testicular hypofunction: Secondary | ICD-10-CM | POA: Diagnosis present

## 2020-07-11 DIAGNOSIS — Z8546 Personal history of malignant neoplasm of prostate: Secondary | ICD-10-CM | POA: Diagnosis not present

## 2020-07-11 DIAGNOSIS — Z79899 Other long term (current) drug therapy: Secondary | ICD-10-CM | POA: Diagnosis not present

## 2020-07-11 DIAGNOSIS — N179 Acute kidney failure, unspecified: Secondary | ICD-10-CM | POA: Diagnosis present

## 2020-07-11 DIAGNOSIS — Z9884 Bariatric surgery status: Secondary | ICD-10-CM | POA: Diagnosis not present

## 2020-07-11 DIAGNOSIS — D696 Thrombocytopenia, unspecified: Secondary | ICD-10-CM | POA: Diagnosis present

## 2020-07-11 DIAGNOSIS — Z7982 Long term (current) use of aspirin: Secondary | ICD-10-CM | POA: Diagnosis not present

## 2020-07-11 DIAGNOSIS — I10 Essential (primary) hypertension: Secondary | ICD-10-CM | POA: Diagnosis present

## 2020-07-11 LAB — URINALYSIS, COMPLETE (UACMP) WITH MICROSCOPIC
Bacteria, UA: NONE SEEN
Bilirubin Urine: NEGATIVE
Glucose, UA: 150 mg/dL — AB
Hgb urine dipstick: NEGATIVE
Ketones, ur: NEGATIVE mg/dL
Leukocytes,Ua: NEGATIVE
Nitrite: NEGATIVE
Protein, ur: 30 mg/dL — AB
Specific Gravity, Urine: 1.035 — ABNORMAL HIGH (ref 1.005–1.030)
Squamous Epithelial / HPF: NONE SEEN (ref 0–5)
pH: 5 (ref 5.0–8.0)

## 2020-07-11 LAB — BASIC METABOLIC PANEL
Anion gap: 6 (ref 5–15)
BUN: 24 mg/dL — ABNORMAL HIGH (ref 8–23)
CO2: 25 mmol/L (ref 22–32)
Calcium: 8 mg/dL — ABNORMAL LOW (ref 8.9–10.3)
Chloride: 106 mmol/L (ref 98–111)
Creatinine, Ser: 1.49 mg/dL — ABNORMAL HIGH (ref 0.61–1.24)
GFR, Estimated: 50 mL/min — ABNORMAL LOW (ref >60)
Glucose, Bld: 160 mg/dL — ABNORMAL HIGH (ref 70–99)
Potassium: 4.2 mmol/L (ref 3.5–5.1)
Sodium: 137 mmol/L (ref 135–145)

## 2020-07-11 LAB — CBG MONITORING, ED
Glucose-Capillary: 172 mg/dL — ABNORMAL HIGH (ref 70–99)
Glucose-Capillary: 88 mg/dL (ref 70–99)
Glucose-Capillary: 216 mg/dL — ABNORMAL HIGH (ref 70–99)
Glucose-Capillary: 115 mg/dL — ABNORMAL HIGH (ref 70–99)
Glucose-Capillary: 135 mg/dL — ABNORMAL HIGH (ref 70–99)

## 2020-07-11 LAB — HEMOGLOBIN A1C
Hgb A1c MFr Bld: 6.3 % — ABNORMAL HIGH (ref 4.8–5.6)
Mean Plasma Glucose: 134.11 mg/dL

## 2020-07-11 LAB — CBC
HCT: 34.5 % — ABNORMAL LOW (ref 39.0–52.0)
Hemoglobin: 10.8 g/dL — ABNORMAL LOW (ref 13.0–17.0)
MCH: 31.8 pg (ref 26.0–34.0)
MCHC: 31.3 g/dL (ref 30.0–36.0)
MCV: 101.5 fL — ABNORMAL HIGH (ref 80.0–100.0)
Platelets: 88 10*3/uL — ABNORMAL LOW (ref 150–400)
RBC: 3.4 MIL/uL — ABNORMAL LOW (ref 4.22–5.81)
RDW: 13.1 % (ref 11.5–15.5)
WBC: 8.4 10*3/uL (ref 4.0–10.5)
nRBC: 0 % (ref 0.0–0.2)

## 2020-07-11 LAB — RESP PANEL BY RT-PCR (FLU A&B, COVID) ARPGX2
Influenza A by PCR: POSITIVE — AB
Influenza B by PCR: NEGATIVE
SARS Coronavirus 2 by RT PCR: NEGATIVE

## 2020-07-11 LAB — BLOOD GAS, ARTERIAL
Acid-base deficit: 4.1 mmol/L — ABNORMAL HIGH (ref 0.0–2.0)
Bicarbonate: 20.9 mmol/L (ref 20.0–28.0)
Expiratory PAP: 10
FIO2: 0.21
Mode: POSITIVE
O2 Saturation: 93.6 %
Patient temperature: 37
pCO2 arterial: 37 mmHg (ref 32.0–48.0)
pH, Arterial: 7.36 (ref 7.350–7.450)
pO2, Arterial: 72 mmHg — ABNORMAL LOW (ref 83.0–108.0)

## 2020-07-11 LAB — HIV ANTIBODY (ROUTINE TESTING W REFLEX): HIV Screen 4th Generation wRfx: NONREACTIVE

## 2020-07-11 LAB — LACTIC ACID, PLASMA
Lactic Acid, Venous: 5.1 mmol/L (ref 0.5–1.9)
Lactic Acid, Venous: 2.4 mmol/L (ref 0.5–1.9)
Lactic Acid, Venous: 1.9 mmol/L (ref 0.5–1.9)

## 2020-07-11 LAB — PROCALCITONIN: Procalcitonin: 0.61 ng/mL

## 2020-07-11 LAB — POC SARS CORONAVIRUS 2 AG -  ED: SARS Coronavirus 2 Ag: NEGATIVE

## 2020-07-11 MED ORDER — OSELTAMIVIR PHOSPHATE 75 MG PO CAPS: 75.0000 mg | ORAL_CAPSULE | Freq: Two times a day (BID) | ORAL | Status: DC

## 2020-07-11 MED ORDER — POLYETHYLENE GLYCOL 3350 17 G PO PACK: 17.0000 g | PACK | Freq: Every day | ORAL | Status: DC | PRN

## 2020-07-11 MED ORDER — ONDANSETRON HCL 4 MG/2ML IJ SOLN: 4.0000 mg | Freq: Four times a day (QID) | INTRAMUSCULAR | Status: DC | PRN

## 2020-07-11 MED ORDER — DOCUSATE SODIUM 100 MG PO CAPS: 100.0000 mg | ORAL_CAPSULE | Freq: Two times a day (BID) | ORAL | Status: DC | PRN

## 2020-07-11 MED ORDER — IOHEXOL 300 MG/ML  SOLN
75.0000 mL | Freq: Once | INTRAMUSCULAR | Status: AC | PRN
  Administered 2020-07-11: 75 mL via INTRAVENOUS

## 2020-07-11 MED ORDER — IPRATROPIUM-ALBUTEROL 0.5-2.5 (3) MG/3ML IN SOLN
3.0000 mL | RESPIRATORY_TRACT | Status: DC | PRN
  Administered 2020-07-11 – 2020-07-12 (×4): 3 mL via RESPIRATORY_TRACT
  Filled 2020-07-11 (×4): qty 3

## 2020-07-11 MED ORDER — CALCIUM GLUCONATE-NACL 1-0.675 GM/50ML-% IV SOLN
1.0000 g | Freq: Once | INTRAVENOUS | Status: AC
  Administered 2020-07-11: 1000 mg via INTRAVENOUS
  Filled 2020-07-11: qty 50

## 2020-07-11 MED ORDER — PIPERACILLIN-TAZOBACTAM 3.375 G IVPB
3.3750 g | Freq: Three times a day (TID) | INTRAVENOUS | Status: DC
  Administered 2020-07-11 – 2020-07-12 (×4): 3.375 g via INTRAVENOUS
  Filled 2020-07-11 (×4): qty 50

## 2020-07-11 MED ORDER — OSELTAMIVIR PHOSPHATE 30 MG PO CAPS
30.0000 mg | ORAL_CAPSULE | Freq: Two times a day (BID) | ORAL | Status: DC
  Administered 2020-07-11: 30 mg via ORAL
  Filled 2020-07-11 (×3): qty 1

## 2020-07-11 MED ORDER — CALCIUM GLUCONATE-NACL 2-0.675 GM/100ML-% IV SOLN
2.0000 g | Freq: Once | INTRAVENOUS | Status: AC
  Administered 2020-07-11: 2000 mg via INTRAVENOUS
  Filled 2020-07-11: qty 100

## 2020-07-11 MED ORDER — CALCIUM GLUCONATE-NACL 1-0.675 GM/50ML-% IV SOLN: 1.0000 g | Freq: Once | INTRAVENOUS | Status: DC

## 2020-07-11 MED ORDER — LACTATED RINGERS IV SOLN: INTRAVENOUS | Status: DC

## 2020-07-11 MED ORDER — INSULIN ASPART 100 UNIT/ML ~~LOC~~ SOLN
0.0000 [IU] | SUBCUTANEOUS | Status: DC
  Administered 2020-07-11: 3 [IU] via SUBCUTANEOUS
  Administered 2020-07-11: 2 [IU] via SUBCUTANEOUS
  Filled 2020-07-11 (×2): qty 1

## 2020-07-11 MED ORDER — LACTATED RINGERS IV BOLUS
1000.0000 mL | Freq: Once | INTRAVENOUS | Status: AC
  Administered 2020-07-11: 1000 mL via INTRAVENOUS

## 2020-07-11 MED ORDER — HEPARIN SODIUM (PORCINE) 5000 UNIT/ML IJ SOLN
5000.0000 [IU] | Freq: Three times a day (TID) | INTRAMUSCULAR | Status: DC
  Administered 2020-07-11 – 2020-07-13 (×7): 5000 [IU] via SUBCUTANEOUS
  Filled 2020-07-11 (×7): qty 1

## 2020-07-11 MED ORDER — ACETAMINOPHEN 325 MG PO TABS: 650.0000 mg | ORAL_TABLET | ORAL | Status: DC | PRN

## 2020-07-11 MED ORDER — OSELTAMIVIR PHOSPHATE 75 MG PO CAPS
75.0000 mg | ORAL_CAPSULE | Freq: Once | ORAL | Status: DC
  Filled 2020-07-11: qty 1

## 2020-07-11 NOTE — Progress Notes (Signed)
PHARMACY CONSULT NOTE - FOLLOW UP  Pharmacy Consult for Electrolyte Monitoring and Replacement   Recent Labs: Potassium (mmol/L)  Date Value  07/11/2020 4.2  07/29/2011 3.8   Magnesium (mg/dL)  Date Value  58/03/9832 1.9  07/29/2011 1.4 (L)   Calcium (mg/dL)  Date Value  82/50/5397 8.0 (L)   Calcium, Total (mg/dL)  Date Value  67/34/1937 8.5   Albumin (g/dL)  Date Value  90/24/0973 3.5  12/15/2019 4.4  07/29/2011 4.0   Phosphorus (mg/dL)  Date Value  53/29/9242 2.8   Sodium (mmol/L)  Date Value  07/11/2020 137  07/02/2020 141  07/29/2011 140   Assessment: Ca 8.0, Albumin WNL.  Goal of Therapy:  Replace lytes to WNL  Plan:  Received 1g IV CaGluc and Ca2+ remained 8.0, will replete with Calcium Gluconate 2gm IV x1 now. F/U lytes with AM lab draw  Martyn Malay ,PharmD Clinical Pharmacist 07/11/2020 10:34 AM

## 2020-07-11 NOTE — Progress Notes (Signed)
PHARMACY CONSULT NOTE - FOLLOW UP  Pharmacy Consult for Electrolyte Monitoring and Replacement   Recent Labs: Potassium (mmol/L)  Date Value  07/10/2020 4.3  07/29/2011 3.8   Magnesium (mg/dL)  Date Value  86/75/4492 1.9  07/29/2011 1.4 (L)   Calcium (mg/dL)  Date Value  01/00/7121 8.0 (L)   Calcium, Total (mg/dL)  Date Value  97/58/8325 8.5   Albumin (g/dL)  Date Value  49/82/6415 3.5  12/15/2019 4.4  07/29/2011 4.0   Phosphorus (mg/dL)  Date Value  83/03/4075 2.8   Sodium (mmol/L)  Date Value  07/10/2020 139  07/02/2020 141  07/29/2011 140   Assessment: Ca 8.0, Albumin WNL  Goal of Therapy:  Replace lytes to WNL  Plan:  Calcium Gluconate 1gm IV now F/U lytes with next lab draw  Wayland Denis ,PharmD Clinical Pharmacist 07/11/2020 3:36 AM

## 2020-07-11 NOTE — Progress Notes (Signed)
Pharmacy Antibiotic Note  Andrew Cobb is a 70 y.o. male admitted on 07/10/2020 with IAI.  Pharmacy has been consulted for Zosyn dosing.  Plan: Zosyn 3.375g IV q8h (4 hour infusion).     Temp (24hrs), Avg:97.8 F (36.6 C), Min:97.8 F (36.6 C), Max:97.8 F (36.6 C)  Recent Labs  Lab 07/04/20 0848 07/10/20 2247 07/11/20 0145  WBC 4.4 10.0  --   CREATININE 1.06 1.92*  --   LATICACIDVEN  --  7.0* 5.1*    Estimated Creatinine Clearance: 39.8 mL/min (A) (by C-G formula based on SCr of 1.92 mg/dL (H)).    Allergies  Allergen Reactions  . Succinylsulphathiazole Rash  . Sulfamethoxazole-Trimethoprim Rash  . Tetracyclines & Related Rash    Antimicrobials this admission:   >>    >>   Dose adjustments this admission:   Microbiology results:  BCx:   UCx:    Sputum:    MRSA PCR:   Thank you for allowing pharmacy to be a part of this patient's care.  Valrie Hart A 07/11/2020 3:08 AM

## 2020-07-11 NOTE — ED Notes (Signed)
Patient placed on nighttime cpap

## 2020-07-11 NOTE — Sepsis Progress Note (Signed)
Sepsis monitoring complete 

## 2020-07-11 NOTE — Progress Notes (Signed)
CODE SEPSIS - PHARMACY COMMUNICATION  **Broad Spectrum Antibiotics should be administered within 1 hour of Sepsis diagnosis**  Time Code Sepsis Called/Page Received: 2238  Antibiotics Ordered: Zosyn  Time of 1st antibiotic administration: 2309  Additional action taken by pharmacy: n/a  If necessary, Name of Provider/Nurse Contacted: n/a    Wayland Denis ,PharmD Clinical Pharmacist  07/11/2020  12:10 AM

## 2020-07-11 NOTE — H&P (Signed)
NAME:  Andrew Cobb, MRN:  [id number], DOB:  [birth year], LOS: 0 ADMISSION DATE:  07/10/2020, CONSULTATION DATE:  07/11/2020 REFERRING MD:  Dr. Leonides Schanz, CHIEF COMPLAINT:  Diarrhea, Hypotension   Brief History:  70 y.o. Male admitted with Hypovolemic shock plus or minus septic shock in the setting of Diarrhea and questionable Gastroenteritis.  Incidentally found to be positive for Influenza A.  History of Present Illness:  Andrew Cobb is a 70 year old male with a past medical history significant for sleep apnea on CPAP, thrombocythemia, stroke, hypertension, hyperlipidemia, GERD, diabetes mellitus, prostate cancer who presents to [facility] ED on 07/10/2020 due to complaints of altered mental status and confusion.  Patient is currently lethargic and altered, no family is present, therefore history is obtained from ED nursing notes.  Per notes, EMS reported that he was having copious frequent diarrhea on their arrival, he was hypotensive with systolic blood pressure in the 60s.  EMS gave 1.5 L of normal saline bolus which improved blood pressure to about 90/40.  Patient denied chest pain, shortness of breath, abdominal pain, hematemesis, melena, hematochezia, recent antibiotic use, no blood 30s.  ED course: On arrival to the ED vitals included: Temperature 97.8 F, heart rate 107, blood pressure 96/45, 98% SPO2 on room air.  EKG with sinus tachycardia normal ST segments and T waves.  Work-up reveals bicarb 18, anion gap 15, glucose 189, BUN 25, creatinine 1.92, lactic acid 7.0, WBC 10.0 with neutrophilia, hemoglobin 12.3, platelets 106.  Chest x-ray shows coarse bilateral interstitial opacities suspicious for postinfectious scarring, but no acute superimposed airspace disease.  CT abdomen and pelvis shows sigmoid diverticulosis.  His SARS-CoV-2 PCR is negative, however he is positive for influenza A.  He received a total of 3.5 L of IV fluid resuscitation with improvement in blood pressures and improvement in  lactic acid to 5.1.  Sepsis workup in progress, stool studies are pending.  PCCM is asked to admit the patient to stepdown unit for further work-up and treatment of Hypovolemic vs. Septic shock in the setting of Diarrhea and questionable Gastroenteritis. Found incidentally to be positive for Influenza A.  Past Medical History:  Thrombocythemia Stroke Sleep Apnea on CPAP Hypertension Hyperlipidemia GERD Diabetes mellitus CAD Chronic Venous Insufficiency Prostrate Cancer Asthma Arthritis  Significant Hospital Events:  1/5: Admission to Stepdown  Consults:  PCCM  Procedures:  N/A  Significant Diagnostic Tests:  1/4: CXR>>Coarse bilateral interstitial opacities suspicious for post infectious scarring. No acute superimposed airspace disease. 1/5: CT Abdomen & Pelvis>> 1. Sigmoid diverticulosis. 2.   Enlarged prostate gland. 3. Fatty liver with findings likely consistent with a small stable hepatic cyst versus hemangioma. 4. Evidence of prior cholecystectomy.  Micro Data:  1/5: POC SARS-CoV-2 Ag>>negative 1/5: SARS-CoV-2 PCR>>negative 1/5: Influenza A&B PCR>> Positive Influenza A 1/4: Blood x2>> 1/4: Urine>> 1/5: GI panel>> 1/5: C-diff PCR>>   Antimicrobials:  Zosyn 1/4>> Vancomycin 1/4 x1 dose Tamiflu 1/5>>  Interim History / Subjective:  Pt in ED, very lethargic, arouses to voice and admits to Hx of OSA and wearing CPAP at home BP soft in the 90's, but MAP >65; Normal sinus rhythm, afebrile O2 sats 98% on room air   Objective   Blood pressure (!) 78/59, pulse 80, temperature 97.8 F (36.6 C), temperature source Oral, resp. rate 18, SpO2 96 %.       No intake or output data in the 24 hours ending 07/11/20 0304 There were no vitals filed for this visit.  Examination: General: Acutely ill appearing male,  laying in bed, on RA, in NAD HENT: Atraumatic, normocephalic, neck supple, no JVD,  Lungs: Coarse breath sounds bilaterally, even, nonlabored, normal  effort Cardiovascular: Regular rate and rhythm, S1-S2, no murmurs, rubs, gallops Abdomen: Soft, nontender, nondistended, no guarding or rebound tenderness, bowel sounds positive x4 Extremities: Normal bulk and tone, no deformities, no edema Neuro: Lethargic, arouses to voice, follows commands, no focal deficits, oriented to person, place, and time Skin: Warm and dry.  No obvious rashes, lesions, ulcerations  Resolved Hospital Problem list   N/A  Assessment & Plan:   Hypovolemic Shock +/- Septic shock due to Diarrhea and ? Gastroenteritis Hx: HTN -Continuous cardiac monitoring -Maintain MAP >65 -Received IV fluid resuscitation in ED -Continue IV Fluids: LR @ 125 ml/hr -Vasopressors if needed to maintain MAP goal -Trend lactic acid   Influenza A infection ? Gastroenteritis -Monitor fever curve -Trend WBC's -Check Procalcitonin -Follow cultures as above -GI panel and C-diff PCR pending -CT Abdomen/Pelvis on 07/11/20 with sigmoid diverticulosis, but otherwise no acute findings -Place on Zosyn for now -Tamiflu (plan for 10 days)   AKI, likely due to dehydration Anion Gap Metabolic Acidosis in the setting of Lactic Acidosis -Monitor I&O's / urinary output -Follow BMP -Ensure adequate renal perfusion -Avoid nephrotoxic agents as able -Replace electrolytes as indicated, Pharmacy consulted -IV fluids   Mild Thrombocytopenia -Monitor for S/Sx of bleeding -Trend CBC -SQ Heparin for VTE Prophylaxis  -Transfuse for Hgb <7   Diabetes Mellitus -CBG's -SSI -Follow ICU Hypo/hyperglycemia protocol  Best practice (evaluated daily)  Diet: NPO for now until mental status improves Pain/Anxiety/Delirium protocol (if indicated): N/A VAP protocol (if indicated): N/A DVT prophylaxis: Heparin SQ GI prophylaxis: N/A Glucose control: SSI Mobility: Bedrest Disposition:Stepdown  Goals of Care:  Last date of multidisciplinary goals of care discussion:N/A Family and staff present: No  family available Summary of discussion: IV fluids for hypotension Follow up goals of care discussion due: 07/12/19 Code Status: Full code  Labs   CBC: Recent Labs  Lab 07/04/20 0848 07/10/20 2247  WBC 4.4 10.0  NEUTROABS  --  8.3*  HGB 14.3 12.3*  HCT 44.9 39.4  MCV 101.4* 102.1*  PLT 150 106*    Basic Metabolic Panel: Recent Labs  Lab 07/04/20 0848 07/10/20 2247  NA 139 139  K 4.5 4.3  CL 104 106  CO2 25 18*  GLUCOSE 110* 189*  BUN 31* 25*  CREATININE 1.06 1.92*  CALCIUM 9.2 8.0*   GFR: Estimated Creatinine Clearance: 39.8 mL/min (A) (by C-G formula based on SCr of 1.92 mg/dL (H)). Recent Labs  Lab 07/04/20 0848 07/10/20 2247 07/11/20 0145  WBC 4.4 10.0  --   LATICACIDVEN  --  7.0* 5.1*    Liver Function Tests: Recent Labs  Lab 07/10/20 2247  AST 49*  ALT 37  ALKPHOS 52  BILITOT 1.2  PROT 6.2*  ALBUMIN 3.5   No results for input(s): LIPASE, AMYLASE in the last 168 hours. No results for input(s): AMMONIA in the last 168 hours.  ABG    Component Value Date/Time   TCO2 23 09/07/2019 1145     Coagulation Profile: Recent Labs  Lab 07/10/20 2247  INR 1.2    Cardiac Enzymes: No results for input(s): CKTOTAL, CKMB, CKMBINDEX, TROPONINI in the last 168 hours.  HbA1C: HB A1C (BAYER DCA - WAIVED)  Date/Time Value Ref Range Status  07/02/2020 08:49 AM 6.6 <7.0 % Final    Comment:  Diabetic Adult            <7.0                                       Healthy Adult        4.3 - 5.7                                                           (DCCT/NGSP) American Diabetes Association's Summary of Glycemic Recommendations for Adults with Diabetes: Hemoglobin A1c <7.0%. More stringent glycemic goals (A1c <6.0%) may further reduce complications at the cost of increased risk of hypoglycemia.   03/20/2020 08:28 AM 6.7 <7.0 % Final    Comment:                                          Diabetic Adult             <7.0                                       Healthy Adult        4.3 - 5.7                                                           (DCCT/NGSP) American Diabetes Association's Summary of Glycemic Recommendations for Adults with Diabetes: Hemoglobin A1c <7.0%. More stringent glycemic goals (A1c <6.0%) may further reduce complications at the cost of increased risk of hypoglycemia.     CBG: Recent Labs  Lab 07/04/20 0818  GLUCAP 89    Review of Systems:   Positives in BOLD: Pt currently denies all complaints Gen: Denies fever, chills, weight change, fatigue, night sweats HEENT: Denies blurred vision, double vision, hearing loss, tinnitus, sinus congestion, rhinorrhea, sore throat, neck stiffness, dysphagia PULM: Denies shortness of breath, cough, sputum production, hemoptysis, wheezing CV: Denies chest pain, edema, orthopnea, paroxysmal nocturnal dyspnea, palpitations GI: Denies abdominal pain, nausea, vomiting, diarrhea, hematochezia, melena, constipation, change in bowel habits GU: Denies dysuria, hematuria, polyuria, oliguria, urethral discharge Endocrine: Denies hot or cold intolerance, polyuria, polyphagia or appetite change Derm: Denies rash, dry skin, scaling or peeling skin change Heme: Denies easy bruising, bleeding, bleeding gums Neuro: Denies headache, numbness, weakness, slurred speech, loss of memory or consciousness   Past Medical History:  He,  has a past medical history of Arthritis, Asthma, Cancer ([place]), Chronic venous insufficiency, Coronary artery disease (1996), Diabetes mellitus without complication ([place]), GERD (gastroesophageal reflux disease), Hyperlipidemia, Hypertension, Hypogonadism in male, MRSA (methicillin resistant Staphylococcus aureus) infection, Sleep apnea, Stented coronary artery, Stroke ([place]) (09/07/2019), and Thrombocythemia.   Surgical History:   Past Surgical History:  Procedure Laterality Date  . ANGIOPLASTY    . ANGIOPLASTY     with stent  04/07/1995  . ANTERIOR CERVICAL DECOMPRESSION/DISCECTOMY FUSION 4 LEVELS N/A 08/17/2017   Procedure:  Anterior discectomy with fusion and plate fixation Cervical Three-Four, Four-Five, Five-Six, and Six-Seven Fusion;  Surgeon: Ditty, Kevan Ny, MD;  Location: [place];  Service: Neurosurgery;  Laterality: N/A;  Anterior discectomy with fusion and plate fixation Cervical Three-Four, Four-Five, Five-Six, and Six-Seven Fusion   . APPENDECTOMY     1966  . BUBBLE STUDY  09/20/2018   Procedure: BUBBLE STUDY;  Surgeon: Josue Hector, MD;  Location: [place];  Service: Cardiovascular;;  . [place]  . CARDIOVASCULAR STRESS TEST     07/31/2011  . CARPAL TUNNEL RELEASE Left   . CHOLECYSTECTOMY     2006  . colonscopy      08/25/2012  . EYE SURGERY Bilateral    cataract  . FUNCTIONAL ENDOSCOPIC SINUS SURGERY     11/10/2013  . GASTRIC BYPASS     10/05/2012  . HERNIA REPAIR     left inguinal 1981  . INCISION AND DRAINAGE ABSCESS Right 02/26/2017   Procedure: INCISION AND DRAINAGE ABSCESS;  Surgeon: Nickie Retort, MD;  Location: [facility];  Service: Urology;  Laterality: Right;  . JOINT REPLACEMENT     bilateral  . left ankle surgery      05/03/2003   . left carpel tunnel      09/18/1993  . left knee meniscal tear      01/25/2010  . left knee meniscal tear repair      05/04/1996  . left rotator cuff repair      05/03/2003   . LOOP RECORDER INSERTION N/A 09/20/2018   Procedure: LOOP RECORDER INSERTION;  Surgeon: Evans Lance, MD;  Location: [place] [facility];  Service: Cardiovascular;  Laterality: N/A;  . REPLACEMENT TOTAL KNEE BILATERAL  07/13/2015  . right ankle surgery      fracture has 2 screws 07/07/1997  . right carpel tunnel      05/16/1992  . right shoulder replacement      01/27/2006  . SCROTAL EXPLORATION Right 02/26/2017   Procedure: SCROTUM EXPLORATION;  Surgeon: Nickie Retort, MD;  Location: [facility];  Service: Urology;  Laterality: Right;  .  TEE WITHOUT CARDIOVERSION N/A 09/20/2018   Procedure: TRANSESOPHAGEAL ECHOCARDIOGRAM (TEE);  Surgeon: Josue Hector, MD;  Location: [facility];  Service: Cardiovascular;  Laterality: N/A;  . TOTAL KNEE ARTHROPLASTY Left 07/13/2015   Procedure: LEFT TOTAL KNEE ARTHROPLASTY;  Surgeon: Gaynelle Arabian, MD;  Location: [facility];  Service: Orthopedics;  Laterality: Left;     Social History:   reports that he quit smoking about 36 years ago. His smoking use included cigarettes. He has a 10.00 pack-year smoking history. He has never used smokeless tobacco. He reports that he does not drink alcohol and does not use drugs.   Family History:  His family history includes Cancer in his brother, brother, mother, and sister; Diabetes in his father and mother; Heart attack in his father and mother; Heart disease in his father and mother; Hyperlipidemia in his father and mother; Hypertension in his father and mother; Pancreatic cancer in his father; Stroke in his father and mother. There is no history of Kidney cancer, Bladder Cancer, or Prostate cancer.   Allergies Allergies  Allergen Reactions  . Succinylsulphathiazole Rash  . Sulfamethoxazole-Trimethoprim Rash  . Tetracyclines & Related Rash     Home Medications  Prior to Admission medications   Medication Sig Start Date End Date Taking? Authorizing Provider  acyclovir (ZOVIRAX) 400 MG tablet Take 1 tablet (400 mg total) by mouth 2 (two)  times daily. 07/05/20   [name], Henrine Screws T, NP  amitriptyline (ELAVIL) 25 MG tablet Take 1 tablet (25 mg total) by mouth at bedtime. 03/20/20   [name], Henrine Screws T, NP  Ascorbic Acid (VITAMIN C) 1000 MG tablet Take 1,000 mg by mouth daily. Patient not taking: Reported on 07/02/2020    [provider]  aspirin EC 81 MG EC tablet Take 1 tablet (81 mg total) by mouth daily. 02/11/19   Debbe Odea, MD  atorvastatin (LIPITOR) 40 MG tablet Take 1 tablet (40 mg total) by mouth at bedtime. 03/20/20   [name], Henrine Screws T, NP   cetirizine (ZYRTEC) 10 MG tablet Take 10 mg by mouth daily.    [provider]  Cholecalciferol (VITAMIN D) 2000 units CAPS Take 2,000 Units by mouth daily.    [provider]  empagliflozin (JARDIANCE) 10 MG TABS tablet Take 10 mg by mouth daily before breakfast. 07/20/19   [name], Henrine Screws T, NP  Lancets (ONETOUCH ULTRASOFT) lancets 1 each by Other route 2 (two) times daily. Dx E11.9 10/04/19   Volney American, PA-C  levETIRAcetam (KEPPRA) 500 MG tablet Take 1 tablet (500 mg total) by mouth 2 (two) times daily. 05/09/20   [name], NP  metFORMIN (GLUCOPHAGE) 1000 MG tablet Take 1 tablet (1,000 mg total) by mouth 2 (two) times daily with a meal. 07/02/20   [name], Henrine Screws T, NP  Multiple Vitamins-Minerals (MULTIVITAMIN PO) Take 1 tablet by mouth daily.     [provider]  [facility] ULTRA test strip USE TWICE DAILY 01/13/20   Park Liter P, DO  tamsulosin (FLOMAX) 0.4 MG CAPS capsule Take 1 capsule (0.4 mg total) by mouth at bedtime. 07/02/20   [name], Henrine Screws T, NP  telmisartan (MICARDIS) 80 MG tablet Take 0.5 tablets (40 mg total) by mouth daily. 07/02/20   [name], Henrine Screws T, NP  vitamin B-12 (CYANOCOBALAMIN) 1000 MCG tablet Take 1 tablet (1,000 mcg total) by mouth daily. 07/03/20   [name], NP     Critical care time: 60 minutes     Darel Hong, [facility] [place] [facility] Pager: [phone]

## 2020-07-12 ENCOUNTER — Encounter: Payer: Self-pay | Admitting: Internal Medicine

## 2020-07-12 ENCOUNTER — Encounter (HOSPITAL_COMMUNITY): Admission: RE | Payer: Self-pay | Source: Ambulatory Visit

## 2020-07-12 ENCOUNTER — Ambulatory Visit (HOSPITAL_COMMUNITY): Admission: RE | Admit: 2020-07-12 | Payer: PPO | Source: Ambulatory Visit | Admitting: Orthopedic Surgery

## 2020-07-12 DIAGNOSIS — J101 Influenza due to other identified influenza virus with other respiratory manifestations: Secondary | ICD-10-CM | POA: Diagnosis present

## 2020-07-12 DIAGNOSIS — N179 Acute kidney failure, unspecified: Secondary | ICD-10-CM | POA: Diagnosis present

## 2020-07-12 LAB — BASIC METABOLIC PANEL
Anion gap: 7 (ref 5–15)
BUN: 18 mg/dL (ref 8–23)
CO2: 25 mmol/L (ref 22–32)
Calcium: 8.3 mg/dL — ABNORMAL LOW (ref 8.9–10.3)
Chloride: 104 mmol/L (ref 98–111)
Creatinine, Ser: 1.01 mg/dL (ref 0.61–1.24)
GFR, Estimated: >60 mL/min (ref >60)
Glucose, Bld: 129 mg/dL — ABNORMAL HIGH (ref 70–99)
Potassium: 4.7 mmol/L (ref 3.5–5.1)
Sodium: 136 mmol/L (ref 135–145)

## 2020-07-12 LAB — CBG MONITORING, ED
Glucose-Capillary: 116 mg/dL — ABNORMAL HIGH (ref 70–99)
Glucose-Capillary: 85 mg/dL (ref 70–99)
Glucose-Capillary: 100 mg/dL — ABNORMAL HIGH (ref 70–99)
Glucose-Capillary: 135 mg/dL — ABNORMAL HIGH (ref 70–99)
Glucose-Capillary: 111 mg/dL — ABNORMAL HIGH (ref 70–99)

## 2020-07-12 LAB — TYPE AND SCREEN
ABO/RH(D): A POS
Antibody Screen: NEGATIVE

## 2020-07-12 LAB — CBC
HCT: 36.8 % — ABNORMAL LOW (ref 39.0–52.0)
Hemoglobin: 12.1 g/dL — ABNORMAL LOW (ref 13.0–17.0)
MCH: 32.4 pg (ref 26.0–34.0)
MCHC: 32.9 g/dL (ref 30.0–36.0)
MCV: 98.4 fL (ref 80.0–100.0)
Platelets: 100 10*3/uL — ABNORMAL LOW (ref 150–400)
RBC: 3.74 MIL/uL — ABNORMAL LOW (ref 4.22–5.81)
RDW: 12.6 % (ref 11.5–15.5)
WBC: 4.5 10*3/uL (ref 4.0–10.5)
nRBC: 0 % (ref 0.0–0.2)

## 2020-07-12 LAB — GLUCOSE, CAPILLARY: Glucose-Capillary: 106 mg/dL — ABNORMAL HIGH (ref 70–99)

## 2020-07-12 LAB — URINE CULTURE: Culture: NO GROWTH

## 2020-07-12 LAB — PROCALCITONIN: Procalcitonin: 0.35 ng/mL

## 2020-07-12 SURGERY — ARTHROPLASTY, SHOULDER, TOTAL, REVERSE
Anesthesia: General | Site: Shoulder | Laterality: Left

## 2020-07-12 MED ORDER — TAMSULOSIN HCL 0.4 MG PO CAPS
0.4000 mg | ORAL_CAPSULE | Freq: Every day | ORAL | Status: DC
  Administered 2020-07-12: 0.4 mg via ORAL
  Filled 2020-07-12: qty 1

## 2020-07-12 MED ORDER — ENSURE ENLIVE PO LIQD
237.0000 mL | Freq: Three times a day (TID) | ORAL | Status: DC
  Administered 2020-07-12 – 2020-07-13 (×2): 237 mL via ORAL

## 2020-07-12 MED ORDER — INSULIN ASPART 100 UNIT/ML ~~LOC~~ SOLN
0.0000 [IU] | SUBCUTANEOUS | Status: DC
  Administered 2020-07-13: 1 [IU] via SUBCUTANEOUS
  Filled 2020-07-12: qty 1

## 2020-07-12 MED ORDER — CALCIUM GLUCONATE-NACL 2-0.675 GM/100ML-% IV SOLN
2.0000 g | Freq: Once | INTRAVENOUS | Status: AC
  Administered 2020-07-12: 2000 mg via INTRAVENOUS
  Filled 2020-07-12: qty 100

## 2020-07-12 MED ORDER — ATORVASTATIN CALCIUM 20 MG PO TABS
40.0000 mg | ORAL_TABLET | Freq: Every day | ORAL | Status: DC
  Administered 2020-07-12: 40 mg via ORAL
  Filled 2020-07-12: qty 2

## 2020-07-12 MED ORDER — OSELTAMIVIR PHOSPHATE 75 MG PO CAPS
75.0000 mg | ORAL_CAPSULE | Freq: Two times a day (BID) | ORAL | Status: DC
  Administered 2020-07-13 (×2): 75 mg via ORAL
  Filled 2020-07-12 (×4): qty 1

## 2020-07-12 MED ORDER — LEVETIRACETAM 500 MG PO TABS
500.0000 mg | ORAL_TABLET | Freq: Two times a day (BID) | ORAL | Status: DC
  Administered 2020-07-12 – 2020-07-13 (×2): 500 mg via ORAL
  Filled 2020-07-12 (×3): qty 1

## 2020-07-12 MED ORDER — ASPIRIN EC 81 MG PO TBEC
81.0000 mg | DELAYED_RELEASE_TABLET | Freq: Every day | ORAL | Status: DC
Start: 2020-07-12 — End: 2020-07-13
  Administered 2020-07-13: 81 mg via ORAL
  Filled 2020-07-12 (×2): qty 1

## 2020-07-12 MED ORDER — ADULT MULTIVITAMIN W/MINERALS CH
1.0000 | ORAL_TABLET | Freq: Every day | ORAL | Status: DC
  Administered 2020-07-13: 1 via ORAL
  Filled 2020-07-12: qty 1

## 2020-07-12 NOTE — Progress Notes (Signed)
PHARMACY CONSULT NOTE - FOLLOW UP  Pharmacy Consult for Electrolyte Monitoring and Replacement   Recent Labs: Potassium (mmol/L)  Date Value  07/12/2020 4.7  07/29/2011 3.8   Magnesium (mg/dL)  Date Value  39/53/2023 1.9  07/29/2011 1.4 (L)   Calcium (mg/dL)  Date Value  34/35/6861 8.3 (L)   Calcium, Total (mg/dL)  Date Value  68/37/2902 8.5   Albumin (g/dL)  Date Value  05/22/5207 3.5  12/15/2019 4.4  07/29/2011 4.0   Phosphorus (mg/dL)  Date Value  08/29/3610 2.8   Sodium (mmol/L)  Date Value  07/12/2020 136  07/02/2020 141  07/29/2011 140   Assessment: Ca 8.0, Albumin WNL.  Goal of Therapy:  Replace lytes to WNL  Plan:  Received 2g IV CaGluc and Ca2+ increased 8.0>8.3, will replete with Calcium Gluconate 2gm IV x1 now. F/U lytes with AM lab draw  Martyn Malay ,PharmD Clinical Pharmacist 07/12/2020 10:40 AM

## 2020-07-12 NOTE — Progress Notes (Addendum)
Creatinine off of IV fluids.    Progress Note    MICAHEL SELMER  [id number] DOB: 06-Oct-1950  DOA: 07/10/2020 PCP: [name], NP      Brief Narrative:    Medical records reviewed and are as summarized below:  EGOR KERSHNER is a 70 y.o. male       Assessment/Plan:   Principal Problem:   Hypovolemia due to dehydration Active Problems:   AKI (acute kidney injury) ([place])   Influenza A   Hypovolemic shock secondary to diarrhea: Blood pressure has improved with IV fluids.  Diarrhea appears to have resolved.  Discontinue Zosyn.  Monitor BP overnight.  Influenza A infection: Continue Tamiflu.  AKI: Improving.  IV fluids has been discontinued.  Monitor creatinine off of IV fluids.  Telmisartan has been held.  Type II DM: Metformin and Jardiance on hold.  Humalog as needed for hyperglycemia.  CAD and history of stroke: Resume aspirin and Lipitor  Seizure disorder: Resume Keppra   Diet Order            Diet regular Room service appropriate? Yes; Fluid consistency: Thin  Diet effective now                    Consultants:  Intensivist  Procedures:  None    Medications:   . heparin  5,000 Units Subcutaneous Q8H  . insulin aspart  0-9 Units Subcutaneous Q4H  . oseltamivir  75 mg Oral Once   Followed by  . oseltamivir  75 mg Oral BID   Continuous Infusions: . calcium gluconate    . lactated ringers Stopped (07/12/20 1227)     Anti-infectives (From admission, onward)   Start     Dose/Rate Route Frequency Ordered Stop   07/12/20 2200  oseltamivir (TAMIFLU) capsule 75 mg       "Followed by" Linked Group Details   75 mg Oral 2 times daily 07/12/20 1051 07/16/20 0959   07/11/20 2200  oseltamivir (TAMIFLU) capsule 30 mg  Status:  Discontinued       "Followed by" Linked Group Details   30 mg Oral 2 times daily 07/11/20 0417 07/12/20 1051   07/11/20 0800  piperacillin-tazobactam (ZOSYN) IVPB 3.375 g  Status:  Discontinued        3.375  g 12.5 mL/hr over 240 Minutes Intravenous Every 8 hours 07/11/20 0307 07/12/20 1449   07/11/20 0500  oseltamivir (TAMIFLU) capsule 75 mg       "Followed by" Linked Group Details   75 mg Oral  Once 07/11/20 0417     07/11/20 0415  oseltamivir (TAMIFLU) capsule 75 mg  Status:  Discontinued        75 mg Oral 2 times daily 07/11/20 0411 07/11/20 0416   07/11/20 0000  vancomycin (VANCOCIN) IVPB 1000 mg/200 mL premix        1,000 mg 200 mL/hr over 60 Minutes Intravenous  Once 07/10/20 2359 07/11/20 0217   07/10/20 2245  piperacillin-tazobactam (ZOSYN) IVPB 3.375 g        3.375 g 100 mL/hr over 30 Minutes Intravenous  Once 07/10/20 2238 07/10/20 2339             Family Communication/Anticipated D/C date and plan/Code Status   DVT prophylaxis: heparin injection 5,000 Units Start: 07/11/20 0600 SCDs Start: 07/11/20 0320     Code Status: Full Code  Family Communication: None Disposition Plan:    Status is: Inpatient  Remains inpatient appropriate because:IV treatments appropriate due to intensity of  illness or inability to take PO and Inpatient level of care appropriate due to severity of illness   Dispo: The patient is from: Home              Anticipated d/c is to: Home              Anticipated d/c date is: 1 day              Patient currently is not medically stable to d/c.           Subjective:   C/o cough, generalized weakness.  No diarrhea, vomiting or abdominal pain.  Objective:    Vitals:   07/12/20 0500 07/12/20 0600 07/12/20 1210 07/12/20 1300  BP: (!) 145/73 (!) 147/64 (!) 157/77 (!) 157/76  Pulse: 61 65 80 62  Resp: (!) 23 20 20    Temp:      TempSrc:      SpO2: 95% 97% 93% 93%   No data found.   Intake/Output Summary (Last 24 hours) at 07/12/2020 1452 Last data filed at 07/12/2020 0221 Gross per 24 hour  Intake 100 ml  Output --  Net 100 ml   There were no vitals filed for this visit.  Exam:  GEN: NAD SKIN: No rash EYES: EOMI ENT:  MMM CV: RRR PULM: Bilateral wheezing but no rales heard. ABD: soft, ND, NT, +BS CNS: AAO x 3, non focal EXT: No edema or tenderness   Data Reviewed:   I have personally reviewed following labs and imaging studies:  Labs: Labs show the following:   Basic Metabolic Panel: Recent Labs  Lab 07/10/20 2247 07/11/20 0713 07/12/20 0615  NA 139 137 136  K 4.3 4.2 4.7  CL 106 106 104  CO2 18* 25 25  GLUCOSE 189* 160* 129*  BUN 25* 24* 18  CREATININE 1.92* 1.49* 1.01  CALCIUM 8.0* 8.0* 8.3*   GFR Estimated Creatinine Clearance: 75.7 mL/min (by C-G formula based on SCr of 1.01 mg/dL). Liver Function Tests: Recent Labs  Lab 07/10/20 2247  AST 49*  ALT 37  ALKPHOS 52  BILITOT 1.2  PROT 6.2*  ALBUMIN 3.5   No results for input(s): LIPASE, AMYLASE in the last 168 hours. No results for input(s): AMMONIA in the last 168 hours. Coagulation profile Recent Labs  Lab 07/10/20 2247  INR 1.2    CBC: Recent Labs  Lab 07/10/20 2247 07/11/20 0713 07/12/20 0615  WBC 10.0 8.4 4.5  NEUTROABS 8.3*  --   --   HGB 12.3* 10.8* 12.1*  HCT 39.4 34.5* 36.8*  MCV 102.1* 101.5* 98.4  PLT 106* 88* 100*   Cardiac Enzymes: No results for input(s): CKTOTAL, CKMB, CKMBINDEX, TROPONINI in the last 168 hours. BNP (last 3 results) No results for input(s): PROBNP in the last 8760 hours. CBG: Recent Labs  Lab 07/11/20 2143 07/12/20 0110 07/12/20 0609 07/12/20 0804 07/12/20 1206  GLUCAP 135* 116* 85 100* 135*   D-Dimer: No results for input(s): DDIMER in the last 72 hours. Hgb A1c: Recent Labs    07/11/20 0522  HGBA1C 6.3*   Lipid Profile: No results for input(s): CHOL, HDL, LDLCALC, TRIG, CHOLHDL, LDLDIRECT in the last 72 hours. Thyroid function studies: No results for input(s): TSH, T4TOTAL, T3FREE, THYROIDAB in the last 72 hours.  Invalid input(s): FREET3 Anemia work up: No results for input(s): VITAMINB12, FOLATE, FERRITIN, TIBC, IRON, RETICCTPCT in the last 72  hours. Sepsis Labs: Recent Labs  Lab 07/10/20 2247 07/11/20 0145 07/11/20 0522 07/11/20 [id number]  07/12/20 0615  PROCALCITON  --   --  0.61  --  0.35  WBC 10.0  --   --  8.4 4.5  LATICACIDVEN 7.0* 5.1* 2.4* 1.9  --     Microbiology Recent Results (from the past 240 hour(s))  Surgical pcr screen     Status: Abnormal   Collection Time: 07/04/20  8:30 AM   Specimen: Nasal Mucosa; Nasal Swab  Result Value Ref Range Status   MRSA, PCR POSITIVE (A) NEGATIVE Final    Comment: RESULT CALLED TO, READ BACK BY AND VERIFIED WITH: NEAL,G. RN AT 1323 07/04/20 MULLINS,T    Staphylococcus aureus POSITIVE (A) NEGATIVE Final    Comment: RESULT CALLED TO, READ BACK BY AND VERIFIED WITH: NEAL,G. RN AT 1323 07/04/20 MULLINS,T (NOTE) The Xpert SA Assay (FDA approved for NASAL specimens in patients 33 years of age and older), is one component of a comprehensive surveillance program. It is not intended to diagnose infection nor to guide or monitor treatment. Performed at [facility], [address] [place], [place] [address]   SARS CORONAVIRUS 2 (TAT 6-24 HRS) Nasopharyngeal Nasopharyngeal Swab     Status: None   Collection Time: 07/09/20 11:37 AM   Specimen: Nasopharyngeal Swab  Result Value Ref Range Status   SARS Coronavirus 2 NEGATIVE NEGATIVE Final    Comment: (NOTE) SARS-CoV-2 target nucleic acids are NOT DETECTED.  The SARS-CoV-2 RNA is generally detectable in upper and lower respiratory specimens during the acute phase of infection. Negative results do not preclude SARS-CoV-2 infection, do not rule out co-infections with other pathogens, and should not be used as the sole basis for treatment or other patient management decisions. Negative results must be combined with clinical observations, patient history, and epidemiological information. The expected result is Negative.  Fact Sheet for Patients: HairSlick.no  Fact Sheet for  Healthcare Providers: quierodirigir.com  This test is not yet approved or cleared by the [place] FDA and  has been authorized for detection and/or diagnosis of SARS-CoV-2 by FDA under an Emergency Use Authorization (EUA). This EUA will remain  in effect (meaning this test can be used) for the duration of the COVID-19 declaration under Se ction 564(b)(1) of the Act, 21 U.S.C. section 360bbb-3(b)(1), unless the authorization is terminated or revoked sooner.  Performed at [facility], [address] [place], [place] [address]   Blood Culture (routine x 2)     Status: None (Preliminary result)   Collection Time: 07/10/20 10:47 PM   Specimen: BLOOD  Result Value Ref Range Status   Specimen Description BLOOD RIGHT ANTECUBITAL  Final   Special Requests   Final    BOTTLES DRAWN AEROBIC AND ANAEROBIC Blood Culture adequate volume   Culture   Final    NO GROWTH 2 DAYS Performed at [facility], [address], [place], [place] [address]    Report Status PENDING  Incomplete  Urine culture     Status: None   Collection Time: 07/10/20 10:47 PM   Specimen: Urine, Random  Result Value Ref Range Status   Specimen Description   Final    URINE, RANDOM Performed at [facility], [address], [place], [place] [address]    Special Requests   Final    NONE Performed at [facility], [address], [place], [place] [address]    Culture   Final    NO GROWTH Performed at [facility], [address], [place], [place] [address]  Report Status 07/12/2020 FINAL  Final  Blood Culture (routine x 2)     Status: None (Preliminary result)   Collection Time: 07/10/20 10:48 PM   Specimen: BLOOD  Result Value Ref Range Status   Specimen Description BLOOD LEFT ANTECUBITAL  Final   Special Requests   Final    BOTTLES DRAWN AEROBIC AND ANAEROBIC Blood Culture adequate volume   Culture   Final    NO GROWTH 2 DAYS Performed at  [facility], [address], [place], [place] [address]    Report Status PENDING  Incomplete  Resp Panel by RT-PCR (Flu A&B, Covid) Nasopharyngeal Swab     Status: Abnormal   Collection Time: 07/11/20  3:03 AM   Specimen: Nasopharyngeal Swab; Nasopharyngeal(NP) swabs in vial transport medium  Result Value Ref Range Status   SARS Coronavirus 2 by RT PCR NEGATIVE NEGATIVE Final    Comment: (NOTE) SARS-CoV-2 target nucleic acids are NOT DETECTED.  The SARS-CoV-2 RNA is generally detectable in upper respiratory specimens during the acute phase of infection. The lowest concentration of SARS-CoV-2 viral copies this assay can detect is 138 copies/mL. A negative result does not preclude SARS-Cov-2 infection and should not be used as the sole basis for treatment or other patient management decisions. A negative result may occur with  improper specimen collection/handling, submission of specimen other than nasopharyngeal swab, presence of viral mutation(s) within the areas targeted by this assay, and inadequate number of viral copies(<138 copies/mL). A negative result must be combined with clinical observations, patient history, and epidemiological information. The expected result is Negative.  Fact Sheet for Patients:  BloggerCourse.com  Fact Sheet for Healthcare Providers:  SeriousBroker.it  This test is no t yet approved or cleared by the [place] FDA and  has been authorized for detection and/or diagnosis of SARS-CoV-2 by FDA under an Emergency Use Authorization (EUA). This EUA will remain  in effect (meaning this test can be used) for the duration of the COVID-19 declaration under Section 564(b)(1) of the Act, 21 U.S.C.section 360bbb-3(b)(1), unless the authorization is terminated  or revoked sooner.       Influenza A by PCR POSITIVE (A) NEGATIVE Final   Influenza B by PCR NEGATIVE NEGATIVE Final    Comment:  (NOTE) The Xpert Xpress SARS-CoV-2/FLU/RSV plus assay is intended as an aid in the diagnosis of influenza from Nasopharyngeal swab specimens and should not be used as a sole basis for treatment. Nasal washings and aspirates are unacceptable for Xpert Xpress SARS-CoV-2/FLU/RSV testing.  Fact Sheet for Patients: BloggerCourse.com  Fact Sheet for Healthcare Providers: SeriousBroker.it  This test is not yet approved or cleared by the [place] FDA and has been authorized for detection and/or diagnosis of SARS-CoV-2 by FDA under an Emergency Use Authorization (EUA). This EUA will remain in effect (meaning this test can be used) for the duration of the COVID-19 declaration under Section 564(b)(1) of the Act, 21 U.S.C. section 360bbb-3(b)(1), unless the authorization is terminated or revoked.  Performed at [facility], [address] [place], [place] [address]     Procedures and diagnostic studies:  CT ABDOMEN PELVIS W CONTRAST  Result Date: 07/11/2020 CLINICAL DATA:  Altered mental status and confusion with frequent diarrhea. EXAM: CT ABDOMEN AND PELVIS WITH CONTRAST TECHNIQUE: Multidetector CT imaging of the abdomen and pelvis was performed using the standard protocol following bolus administration of intravenous contrast. CONTRAST:  18mL OMNIPAQUE IOHEXOL 300 MG/ML  SOLN COMPARISON:  July 11, 2012 FINDINGS: Lower chest: Mild areas  of linear scarring and/or atelectasis are seen within the bilateral lung bases. Hepatobiliary: There is diffuse fatty infiltration of the liver parenchyma. A stable 8 mm focus of parenchymal low attenuation is seen within the anterior aspect of the right lobe of the liver. Status post cholecystectomy. No biliary dilatation. Pancreas: Unremarkable. No pancreatic ductal dilatation or surrounding inflammatory changes. Spleen: Normal in size without focal abnormality. Adrenals/Urinary Tract: Adrenal  glands are unremarkable. Kidneys are normal in size, without renal calculi, focal lesion, or hydronephrosis. Stable moderate severity bilateral nonspecific perinephric inflammatory fat stranding is seen. Bladder is unremarkable. Stomach/Bowel: Surgical sutures are seen along the body of the stomach. This represents a new finding when compared to the prior study. The appendix is surgically absent. No evidence of bowel dilatation. Noninflamed diverticula are seen throughout the sigmoid colon. Vascular/Lymphatic: Aortic atherosclerosis. No enlarged abdominal or pelvic lymph nodes. Reproductive: The prostate gland is moderately enlarged. Other: No abdominal wall hernia or abnormality. No abdominopelvic ascites. Musculoskeletal: Marked severity multilevel degenerative changes seen throughout the lumbar spine. IMPRESSION: 1.   Sigmoid diverticulosis. 2.   Enlarged prostate gland. 3. Fatty liver with findings likely consistent with a small stable hepatic cyst versus hemangioma. 4. Evidence of prior cholecystectomy. Aortic Atherosclerosis (ICD10-I70.0). Electronically Signed   By: Virgina Norfolk M.D.   On: 07/11/2020 00:52   DG Chest Port 1 View  Result Date: 07/10/2020 CLINICAL DATA:  Hypotension EXAM: PORTABLE CHEST 1 VIEW COMPARISON:  02/17/2020, CT 09/10/2019, chest x-ray 02/08/2019 FINDINGS: Coarse bilateral interstitial opacities suspicious for post infectious scarring. No acute consolidation or effusion. Stable cardiomediastinal silhouette. No pneumothorax. Electronic recording device over the left chest. Hardware in the cervical spine and right shoulder IMPRESSION: Coarse bilateral interstitial opacities suspicious for post infectious scarring. No acute superimposed airspace disease. Electronically Signed   By: Donavan Foil M.D.   On: 07/10/2020 22:51               LOS: 1 day   Kimba Lottes  [facility]   Pager on [email or web address]. If 7PM-7AM, please contact night-coverage at  [email or web address]     07/12/2020, 2:52 PM

## 2020-07-12 NOTE — ED Notes (Signed)
MD at bedside. 

## 2020-07-12 NOTE — Progress Notes (Signed)
PHARMACY NOTE:  ANTIMICROBIAL RENAL DOSAGE ADJUSTMENT  Current antimicrobial regimen includes a mismatch between antimicrobial dosage and estimated renal function.  As per policy approved by the Pharmacy & Therapeutics and Medical Executive Committees, the antimicrobial dosage will be adjusted accordingly.  Current antimicrobial dosage:  oseltamivir 30mg  BID (previously adj'd)  Indication: Influenza A  Renal Function:  Estimated Creatinine Clearance: 75.7 mL/min (by C-G formula based on SCr of 1.01 mg/dL).  Antimicrobial dosage has been changed to:  oseltamivir 75mg  BID  Additional comments: Improved from ~32ml/min toward baseline. Increasing to standard dosing for treatment.   Thank you for allowing pharmacy to be a part of this patient's care.  , [facility] 07/12/2020 10:49 AM

## 2020-07-12 NOTE — Progress Notes (Signed)
Initial Nutrition Assessment  DOCUMENTATION CODES:   Not applicable  INTERVENTION:   Ensure Enlive po BID, each supplement provides 350 kcal and 20 grams of protein  MVI daily   Pt at high refeed risk; recommend monitor potassium, magnesium and phosphorus labs daily as oral intake improves.   NUTRITION DIAGNOSIS:   Inadequate oral intake related to acute illness as evidenced by per patient/family report.  GOAL:   Patient will meet greater than or equal to 90% of their needs  MONITOR:   PO intake,Supplement acceptance,Labs,Weight trends,Skin,I & O's  REASON FOR ASSESSMENT:   Malnutrition Screening Tool    ASSESSMENT:   70 y.o. male with a history of prostate cancer, CAD s/p cardiac cath, diabetes, GERD, TIA and gastric sleeve in 2014 who is admitted with sepsis and influenza A.   Pt with poor appetite and oral intake pta r/t diarrhea and flu. Pt has continued to have poor appetite and oral intake in hospital. RD will add supplements and MVI to help pt meet his estimated needs. Pt is likely at refeed risk. Per chart, pt is down 10lbs(5%) in < 1 month; this is significant. RD will obtain nutrition related history and exam at follow-up.   Medications reviewed and include: aspirin, heparin, insulin, LRS @125ml /hr  Labs reviewed: cbgs- 116, 85, 100, 135, 111 x 24 hrs AIC 6.3(H)- 1/5  NUTRITION - FOCUSED PHYSICAL EXAM: Unable to perform at this time   Diet Order:   Diet Order            Diet regular Room service appropriate? Yes; Fluid consistency: Thin  Diet effective now                EDUCATION NEEDS:   No education needs have been identified at this time  Skin:  Skin Assessment: Reviewed RN Assessment  Last BM:  1/6  Height:   Ht Readings from Last 1 Encounters:  07/12/20 5\' 10"  (1.778 m)    Weight:   Wt Readings from Last 1 Encounters:  07/12/20 86.2 kg    Ideal Body Weight:  75.45 kg  BMI:  Body mass index is 27.26 kg/m.  Estimated  Nutritional Needs:   Kcal:  1900-2200kcal/day  Protein:  95-110g/day  Fluid:  >1.9L/day  MS, RD, LDN Please refer to [facility] for RD and/or RD on-call/weekend/after hours pager

## 2020-07-13 DIAGNOSIS — N179 Acute kidney failure, unspecified: Principal | ICD-10-CM

## 2020-07-13 DIAGNOSIS — J101 Influenza due to other identified influenza virus with other respiratory manifestations: Secondary | ICD-10-CM

## 2020-07-13 LAB — CBC WITH DIFFERENTIAL/PLATELET
Abs Immature Granulocytes: 0.01 10*3/uL (ref 0.00–0.07)
Basophils Absolute: 0 10*3/uL (ref 0.0–0.1)
Basophils Relative: 0 %
Eosinophils Absolute: 0.1 10*3/uL (ref 0.0–0.5)
Eosinophils Relative: 2 %
HCT: 34.4 % — ABNORMAL LOW (ref 39.0–52.0)
Hemoglobin: 11 g/dL — ABNORMAL LOW (ref 13.0–17.0)
Immature Granulocytes: 0 %
Lymphocytes Relative: 32 %
Lymphs Abs: 1 10*3/uL (ref 0.7–4.0)
MCH: 31.6 pg (ref 26.0–34.0)
MCHC: 32 g/dL (ref 30.0–36.0)
MCV: 98.9 fL (ref 80.0–100.0)
Monocytes Absolute: 0.3 10*3/uL (ref 0.1–1.0)
Monocytes Relative: 11 %
Neutro Abs: 1.7 10*3/uL (ref 1.7–7.7)
Neutrophils Relative %: 55 %
Platelets: 97 10*3/uL — ABNORMAL LOW (ref 150–400)
RBC: 3.48 MIL/uL — ABNORMAL LOW (ref 4.22–5.81)
RDW: 12.5 % (ref 11.5–15.5)
WBC: 3.1 10*3/uL — ABNORMAL LOW (ref 4.0–10.5)
nRBC: 0 % (ref 0.0–0.2)

## 2020-07-13 LAB — GLUCOSE, CAPILLARY
Glucose-Capillary: 100 mg/dL — ABNORMAL HIGH (ref 70–99)
Glucose-Capillary: 141 mg/dL — ABNORMAL HIGH (ref 70–99)
Glucose-Capillary: 146 mg/dL — ABNORMAL HIGH (ref 70–99)
Glucose-Capillary: 76 mg/dL (ref 70–99)

## 2020-07-13 LAB — BASIC METABOLIC PANEL
Anion gap: 7 (ref 5–15)
BUN: 18 mg/dL (ref 8–23)
CO2: 27 mmol/L (ref 22–32)
Calcium: 8.2 mg/dL — ABNORMAL LOW (ref 8.9–10.3)
Chloride: 104 mmol/L (ref 98–111)
Creatinine, Ser: 0.95 mg/dL (ref 0.61–1.24)
GFR, Estimated: >60 mL/min (ref >60)
Glucose, Bld: 121 mg/dL — ABNORMAL HIGH (ref 70–99)
Potassium: 3.7 mmol/L (ref 3.5–5.1)
Sodium: 138 mmol/L (ref 135–145)

## 2020-07-13 LAB — PHOSPHORUS: Phosphorus: 4 mg/dL (ref 2.5–4.6)

## 2020-07-13 LAB — MAGNESIUM: Magnesium: 1.8 mg/dL (ref 1.7–2.4)

## 2020-07-13 LAB — PROCALCITONIN: Procalcitonin: 0.24 ng/mL

## 2020-07-13 MED ORDER — OSELTAMIVIR PHOSPHATE 75 MG PO CAPS: 75.0000 mg | ORAL_CAPSULE | Freq: Two times a day (BID) | ORAL | 0 refills | Status: AC

## 2020-07-13 NOTE — Discharge Summary (Signed)
Physician Discharge Summary  Andrew Cobb [id number] DOB: 20-Oct-1950 DOA: 07/10/2020  PCP: [name], NP  Admit date: 07/10/2020 Discharge date: 07/13/2020  Discharge disposition: Home   Recommendations for Outpatient Follow-Up:   Follow-up with PCP in 1 week  Discharge Diagnosis:   Principal Problem:   Hypovolemia due to dehydration Active Problems:   AKI (acute kidney injury) ([place])   Influenza A    Discharge Condition: Stable.  Diet recommendation:  Diet Order            Diet - low sodium heart healthy           Diet Carb Modified           Diet regular Room service appropriate? Yes; Fluid consistency: Thin  Diet effective now                   Code Status: Full Code     Hospital Course:   [name] is a 70 year old man with medical history significant for prostate cancer, OSA on CPAP, CAD, diabetes mellitus, GERD, stroke, who was brought to the hospital because of confusion and frequent diarrhea.  Apparently when EMS picked him up, his blood pressure was very low with systolic BP in the 0000000.  He was given IV fluid boluses and was brought to the ED for further management.  In the ED, BP was too low (96/45) and he had a lactic acid of 7.  He was found to have acute kidney injury and influenza A infection.  He was treated with additional IV fluids for hypovolemic shock.  Initially, there was concern for sepsis so he was started on empiric IV antibiotics.  However, sepsis was ruled out.  He was also treated with Tamiflu.  His condition has improved significantly.  Diarrhea and AKI have resolved.  Blood pressure has improved.  He is deemed stable for discharge to home today.  Medical Consultants:    Intensivist   Discharge Exam:    Vitals:   07/12/20 1942 07/13/20 0016 07/13/20 0500 07/13/20 0527  BP: 134/60 125/72  133/74  Pulse: 68 (!) 57  (!) 58  Resp: 20 19  20   Temp: 98 F (36.7 C) 98 F (36.7 C)  97.8 F (36.6 C)  TempSrc:  Oral   Oral  SpO2: 99% 94%  94%  Weight:   92.3 kg   Height:         GEN: NAD SKIN: Warm and dry EYES: No pallor or icterus ENT: MMM CV: RRR PULM: CTA B ABD: soft, ND, NT, +BS CNS: AAO x 3, non focal EXT: No edema or tenderness   The results of significant diagnostics from this hospitalization (including imaging, microbiology, ancillary and laboratory) are listed below for reference.     Procedures and Diagnostic Studies:   CT ABDOMEN PELVIS W CONTRAST  Result Date: 07/11/2020 CLINICAL DATA:  Altered mental status and confusion with frequent diarrhea. EXAM: CT ABDOMEN AND PELVIS WITH CONTRAST TECHNIQUE: Multidetector CT imaging of the abdomen and pelvis was performed using the standard protocol following bolus administration of intravenous contrast. CONTRAST:  94mL OMNIPAQUE IOHEXOL 300 MG/ML  SOLN COMPARISON:  July 11, 2012 FINDINGS: Lower chest: Mild areas of linear scarring and/or atelectasis are seen within the bilateral lung bases. Hepatobiliary: There is diffuse fatty infiltration of the liver parenchyma. A stable 8 mm focus of parenchymal low attenuation is seen within the anterior aspect of the right lobe of the liver. Status post cholecystectomy. No  biliary dilatation. Pancreas: Unremarkable. No pancreatic ductal dilatation or surrounding inflammatory changes. Spleen: Normal in size without focal abnormality. Adrenals/Urinary Tract: Adrenal glands are unremarkable. Kidneys are normal in size, without renal calculi, focal lesion, or hydronephrosis. Stable moderate severity bilateral nonspecific perinephric inflammatory fat stranding is seen. Bladder is unremarkable. Stomach/Bowel: Surgical sutures are seen along the body of the stomach. This represents a new finding when compared to the prior study. The appendix is surgically absent. No evidence of bowel dilatation. Noninflamed diverticula are seen throughout the sigmoid colon. Vascular/Lymphatic: Aortic atherosclerosis. No enlarged  abdominal or pelvic lymph nodes. Reproductive: The prostate gland is moderately enlarged. Other: No abdominal wall hernia or abnormality. No abdominopelvic ascites. Musculoskeletal: Marked severity multilevel degenerative changes seen throughout the lumbar spine. IMPRESSION: 1.   Sigmoid diverticulosis. 2.   Enlarged prostate gland. 3. Fatty liver with findings likely consistent with a small stable hepatic cyst versus hemangioma. 4. Evidence of prior cholecystectomy. Aortic Atherosclerosis (ICD10-I70.0). Electronically Signed   By: Virgina Norfolk M.D.   On: 07/11/2020 00:52   DG Chest Port 1 View  Result Date: 07/10/2020 CLINICAL DATA:  Hypotension EXAM: PORTABLE CHEST 1 VIEW COMPARISON:  02/17/2020, CT 09/10/2019, chest x-ray 02/08/2019 FINDINGS: Coarse bilateral interstitial opacities suspicious for post infectious scarring. No acute consolidation or effusion. Stable cardiomediastinal silhouette. No pneumothorax. Electronic recording device over the left chest. Hardware in the cervical spine and right shoulder IMPRESSION: Coarse bilateral interstitial opacities suspicious for post infectious scarring. No acute superimposed airspace disease. Electronically Signed   By: Donavan Foil M.D.   On: 07/10/2020 22:51     Labs:   Basic Metabolic Panel: Recent Labs  Lab 07/10/20 2247 07/11/20 0713 07/12/20 0615 07/13/20 0323  NA 139 137 136 138  K 4.3 4.2 4.7 3.7  CL 106 106 104 104  CO2 18* 25 25 27   GLUCOSE 189* 160* 129* 121*  BUN 25* 24* 18 18  CREATININE 1.92* 1.49* 1.01 0.95  CALCIUM 8.0* 8.0* 8.3* 8.2*  MG  --   --   --  1.8  PHOS  --   --   --  4.0   GFR Estimated Creatinine Clearance: 83.8 mL/min (by C-G formula based on SCr of 0.95 mg/dL). Liver Function Tests: Recent Labs  Lab 07/10/20 2247  AST 49*  ALT 37  ALKPHOS 52  BILITOT 1.2  PROT 6.2*  ALBUMIN 3.5   No results for input(s): LIPASE, AMYLASE in the last 168 hours. No results for input(s): AMMONIA in the last 168  hours. Coagulation profile Recent Labs  Lab 07/10/20 2247  INR 1.2    CBC: Recent Labs  Lab 07/10/20 2247 07/11/20 0713 07/12/20 0615 07/13/20 0323  WBC 10.0 8.4 4.5 3.1*  NEUTROABS 8.3*  --   --  1.7  HGB 12.3* 10.8* 12.1* 11.0*  HCT 39.4 34.5* 36.8* 34.4*  MCV 102.1* 101.5* 98.4 98.9  PLT 106* 88* 100* 97*   Cardiac Enzymes: No results for input(s): CKTOTAL, CKMB, CKMBINDEX, TROPONINI in the last 168 hours. BNP: Invalid input(s): POCBNP CBG: Recent Labs  Lab 07/12/20 1449 07/12/20 1958 07/13/20 0003 07/13/20 0522 07/13/20 0757  GLUCAP 111* 106* 146* 76 100*   D-Dimer No results for input(s): DDIMER in the last 72 hours. Hgb A1c Recent Labs    07/11/20 0522  HGBA1C 6.3*   Lipid Profile No results for input(s): CHOL, HDL, LDLCALC, TRIG, CHOLHDL, LDLDIRECT in the last 72 hours. Thyroid function studies No results for input(s): TSH, T4TOTAL, T3FREE, THYROIDAB in the  last 72 hours.  Invalid input(s): FREET3 Anemia work up No results for input(s): VITAMINB12, FOLATE, FERRITIN, TIBC, IRON, RETICCTPCT in the last 72 hours. Microbiology Recent Results (from the past 240 hour(s))  Surgical pcr screen     Status: Abnormal   Collection Time: 07/04/20  8:30 AM   Specimen: Nasal Mucosa; Nasal Swab  Result Value Ref Range Status   MRSA, PCR POSITIVE (A) NEGATIVE Final    Comment: RESULT CALLED TO, READ BACK BY AND VERIFIED WITH: NEAL,G. RN AT 1323 07/04/20 MULLINS,T    Staphylococcus aureus POSITIVE (A) NEGATIVE Final    Comment: RESULT CALLED TO, READ BACK BY AND VERIFIED WITH: NEAL,G. RN AT 1610 07/04/20 MULLINS,T (NOTE) The Xpert SA Assay (FDA approved for NASAL specimens in patients 28 years of age and older), is one component of a comprehensive surveillance program. It is not intended to diagnose infection nor to guide or monitor treatment. Performed at [facility], [place] [address] [place], [name] [address]   SARS CORONAVIRUS 2  (TAT 6-24 HRS) Nasopharyngeal Nasopharyngeal Swab     Status: None   Collection Time: 07/09/20 11:37 AM   Specimen: Nasopharyngeal Swab  Result Value Ref Range Status   SARS Coronavirus 2 NEGATIVE NEGATIVE Final    Comment: (NOTE) SARS-CoV-2 target nucleic acids are NOT DETECTED.  The SARS-CoV-2 RNA is generally detectable in upper and lower respiratory specimens during the acute phase of infection. Negative results do not preclude SARS-CoV-2 infection, do not rule out co-infections with other pathogens, and should not be used as the sole basis for treatment or other patient management decisions. Negative results must be combined with clinical observations, patient history, and epidemiological information. The expected result is Negative.  Fact Sheet for Patients: SugarRoll.be  Fact Sheet for Healthcare Providers: https://www.woods-mathews.com/  This test is not yet approved or cleared by the [place] FDA and  has been authorized for detection and/or diagnosis of SARS-CoV-2 by FDA under an Emergency Use Authorization (EUA). This EUA will remain  in effect (meaning this test can be used) for the duration of the COVID-19 declaration under Se ction 564(b)(1) of the Act, 21 U.S.C. section 360bbb-3(b)(1), unless the authorization is terminated or revoked sooner.  Performed at [facility], [place] [address], [place], [place] [address]   Blood Culture (routine x 2)     Status: None (Preliminary result)   Collection Time: 07/10/20 10:47 PM   Specimen: BLOOD  Result Value Ref Range Status   Specimen Description BLOOD RIGHT ANTECUBITAL  Final   Special Requests   Final    BOTTLES DRAWN AEROBIC AND ANAEROBIC Blood Culture adequate volume   Culture   Final    NO GROWTH 3 DAYS Performed at [facility], [address], [place], [place] [address]    Report Status PENDING  Incomplete  Urine culture     Status: None    Collection Time: 07/10/20 10:47 PM   Specimen: Urine, Random  Result Value Ref Range Status   Specimen Description   Final    URINE, RANDOM Performed at [facility], [address], [place], [place] [address]    Special Requests   Final    NONE Performed at [facility], [address] [place], [place] [address]    Culture   Final    NO GROWTH Performed at [facility], [place] [address], [place], [place] [address]    Report Status 07/12/2020 FINAL  Final  Blood Culture (  routine x 2)     Status: None (Preliminary result)   Collection Time: 07/10/20 10:48 PM   Specimen: BLOOD  Result Value Ref Range Status   Specimen Description BLOOD LEFT ANTECUBITAL  Final   Special Requests   Final    BOTTLES DRAWN AEROBIC AND ANAEROBIC Blood Culture adequate volume   Culture   Final    NO GROWTH 3 DAYS Performed at [facility], [address] [place], [place] [address]    Report Status PENDING  Incomplete  Resp Panel by RT-PCR (Flu A&B, Covid) Nasopharyngeal Swab     Status: Abnormal   Collection Time: 07/11/20  3:03 AM   Specimen: Nasopharyngeal Swab; Nasopharyngeal(NP) swabs in vial transport medium  Result Value Ref Range Status   SARS Coronavirus 2 by RT PCR NEGATIVE NEGATIVE Final    Comment: (NOTE) SARS-CoV-2 target nucleic acids are NOT DETECTED.  The SARS-CoV-2 RNA is generally detectable in upper respiratory specimens during the acute phase of infection. The lowest concentration of SARS-CoV-2 viral copies this assay can detect is 138 copies/mL. A negative result does not preclude SARS-Cov-2 infection and should not be used as the sole basis for treatment or other patient management decisions. A negative result may occur with  improper specimen collection/handling, submission of specimen other than nasopharyngeal swab, presence of viral mutation(s) within the areas targeted by this assay, and inadequate number of viral copies(<138  copies/mL). A negative result must be combined with clinical observations, patient history, and epidemiological information. The expected result is Negative.  Fact Sheet for Patients:  EntrepreneurPulse.com.au  Fact Sheet for Healthcare Providers:  IncredibleEmployment.be  This test is no t yet approved or cleared by the [place] FDA and  has been authorized for detection and/or diagnosis of SARS-CoV-2 by FDA under an Emergency Use Authorization (EUA). This EUA will remain  in effect (meaning this test can be used) for the duration of the COVID-19 declaration under Section 564(b)(1) of the Act, 21 U.S.C.section 360bbb-3(b)(1), unless the authorization is terminated  or revoked sooner.       Influenza A by PCR POSITIVE (A) NEGATIVE Final   Influenza B by PCR NEGATIVE NEGATIVE Final    Comment: (NOTE) The Xpert Xpress SARS-CoV-2/FLU/RSV plus assay is intended as an aid in the diagnosis of influenza from Nasopharyngeal swab specimens and should not be used as a sole basis for treatment. Nasal washings and aspirates are unacceptable for Xpert Xpress SARS-CoV-2/FLU/RSV testing.  Fact Sheet for Patients: EntrepreneurPulse.com.au  Fact Sheet for Healthcare Providers: IncredibleEmployment.be  This test is not yet approved or cleared by the [place] FDA and has been authorized for detection and/or diagnosis of SARS-CoV-2 by FDA under an Emergency Use Authorization (EUA). This EUA will remain in effect (meaning this test can be used) for the duration of the COVID-19 declaration under Section 564(b)(1) of the Act, 21 U.S.C. section 360bbb-3(b)(1), unless the authorization is terminated or revoked.  Performed at [facility], [address] [place], [place] [address]      Discharge Instructions:   Discharge Instructions    Diet - low sodium heart healthy   Complete by: As directed     Diet Carb Modified   Complete by: As directed    Increase activity slowly   Complete by: As directed      Allergies as of 07/13/2020      Reactions   Succinylsulphathiazole Rash   Sulfamethoxazole-trimethoprim Rash   Tetracyclines & Related Rash      Medication  List    STOP taking these medications   vitamin C 1000 MG tablet     TAKE these medications   acyclovir 400 MG tablet Commonly known as: Zovirax Take 1 tablet (400 mg total) by mouth 2 (two) times daily.   amitriptyline 25 MG tablet Commonly known as: ELAVIL Take 1 tablet (25 mg total) by mouth at bedtime.   aspirin 81 MG EC tablet Take 1 tablet (81 mg total) by mouth daily.   atorvastatin 40 MG tablet Commonly known as: LIPITOR Take 1 tablet (40 mg total) by mouth at bedtime.   cetirizine 10 MG tablet Commonly known as: ZYRTEC Take 10 mg by mouth daily.   Jardiance 10 MG Tabs tablet Generic drug: empagliflozin Take 10 mg by mouth daily before breakfast.   levETIRAcetam 500 MG tablet Commonly known as: KEPPRA Take 1 tablet (500 mg total) by mouth 2 (two) times daily.   metFORMIN 1000 MG tablet Commonly known as: GLUCOPHAGE Take 1 tablet (1,000 mg total) by mouth 2 (two) times daily with a meal.   MULTIVITAMIN PO Take 1 tablet by mouth daily.   OneTouch Ultra test strip Generic drug: glucose blood USE TWICE DAILY   onetouch ultrasoft lancets 1 each by Other route 2 (two) times daily. Dx E11.9   oseltamivir 75 MG capsule Commonly known as: TAMIFLU Take 1 capsule (75 mg total) by mouth 2 (two) times daily for 4 days.   tamsulosin 0.4 MG Caps capsule Commonly known as: Flomax Take 1 capsule (0.4 mg total) by mouth at bedtime.   telmisartan 80 MG tablet Commonly known as: MICARDIS Take 0.5 tablets (40 mg total) by mouth daily.   vitamin B-12 1000 MCG tablet Commonly known as: CYANOCOBALAMIN Take 1 tablet (1,000 mcg total) by mouth daily.   Vitamin D 50 MCG (2000 UT) Caps Take 2,000 Units  by mouth daily.         Time coordinating discharge: 32 minutes  Signed:  Micco Bourbeau  [facility] 07/13/2020, 10:44 AM   Pager on [email or web address]. If 7PM-7AM, please contact night-coverage at [email or web address]

## 2020-07-13 NOTE — Plan of Care (Signed)
Discharge teaching completed with patient who is in stable condition. 

## 2020-07-15 LAB — CULTURE, BLOOD (ROUTINE X 2)
Culture: NO GROWTH
Culture: NO GROWTH
Special Requests: ADEQUATE
Special Requests: ADEQUATE

## 2020-07-16 ENCOUNTER — Telehealth: Payer: Self-pay

## 2020-07-16 NOTE — Telephone Encounter (Signed)
Transition Care Management Follow-up Telephone Call  Date of discharge and from where: 07/14/2019 [facility]  How have you been since you were released from the hospital? good  Any questions or concerns? No  Items Reviewed:  Did the pt receive and understand the discharge instructions provided? Yes   Medications obtained and verified? Yes   Other? No   Any new allergies since your discharge? No   Dietary orders reviewed? Yes  Do you have support at home? Yes   Home Care and Equipment/Supplies: Were home health services ordered? not applicable If so, what is the name of the agency? n/a  Has the agency set up a time to come to the patient's home? not applicable Were any new equipment or medical supplies ordered?  No What is the name of the medical supply agency? n/a Were you able to get the supplies/equipment? not applicable Do you have any questions related to the use of the equipment or supplies? No  Functional Questionnaire: (I = Independent and D = Dependent) ADLs: I  Bathing/Dressing- I  Meal Prep- I  Eating- I  Maintaining continence- I  Transferring/Ambulation- I  Managing Meds- i  Follow up appointments reviewed:   PCP Hospital f/u appt confirmed? Yes  Scheduled to see Marnee Guarneri NP on 07/30/2020 @ 2:00..  Are transportation arrangements needed? No   If their condition worsens, is the pt aware to call PCP or go to the Emergency Dept.? Yes  Was the patient provided with contact information for the PCP's office or ED? Yes  Was to pt encouraged to call back with questions or concerns? Yes

## 2020-07-17 ENCOUNTER — Encounter (INDEPENDENT_AMBULATORY_CARE_PROVIDER_SITE_OTHER): Payer: PPO | Admitting: Ophthalmology

## 2020-07-18 IMAGING — MR MR HEAD W/O CM
4 of 10 series · 13 of 48 positions shown · non-contrast
Comparison: Head CT 09/07/2019, brain MRI 02/09/2019
COMPARISON: Head CT 09/07/2019, brain MRI 02/09/2019

Addendum:
CLINICAL DATA: Ataxia, stroke suspected. Additional history
provided: Recent speech difficulty, history of stroke in [REDACTED] of
last year.

EXAM:
MRI HEAD WITHOUT CONTRAST
TECHNIQUE: Multiplanar, multiecho pulse sequences of the brain and surrounding
structures were obtained without intravenous contrast.

[Series 2: DWI · axial · 3.0mm · 0.94mm/px · z∈[-78,+54]mm · 4 of 100 slices shown (1 of 2)]
[im 1/100]
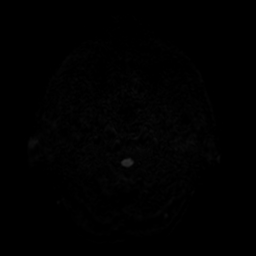
[im 10/100]
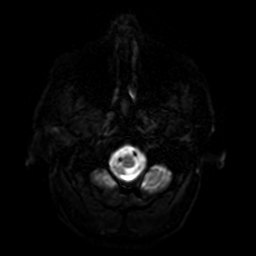
[im 50/100]
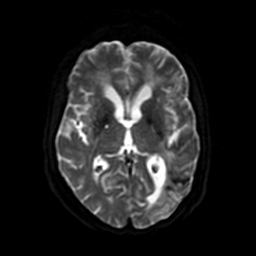
[im 90/100]
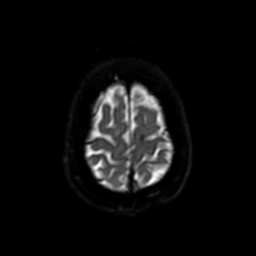

[Series 3: DWI · coronal · 4.0mm · 0.94mm/px · 3 of 76 slices shown (2 of 2)]
[im 10/76]
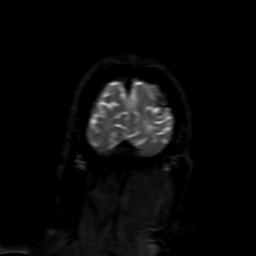
[im 38/76]
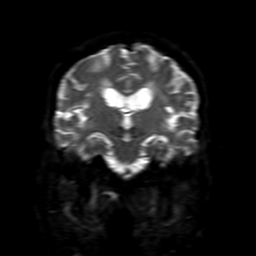
[im 66/76]
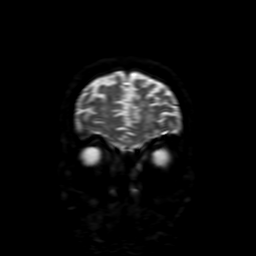

[Series 4: FLAIR · sagittal · 5.0mm · 0.23mm/px · 3 of 24 slices shown (1 of 2)]
[im 1/24]
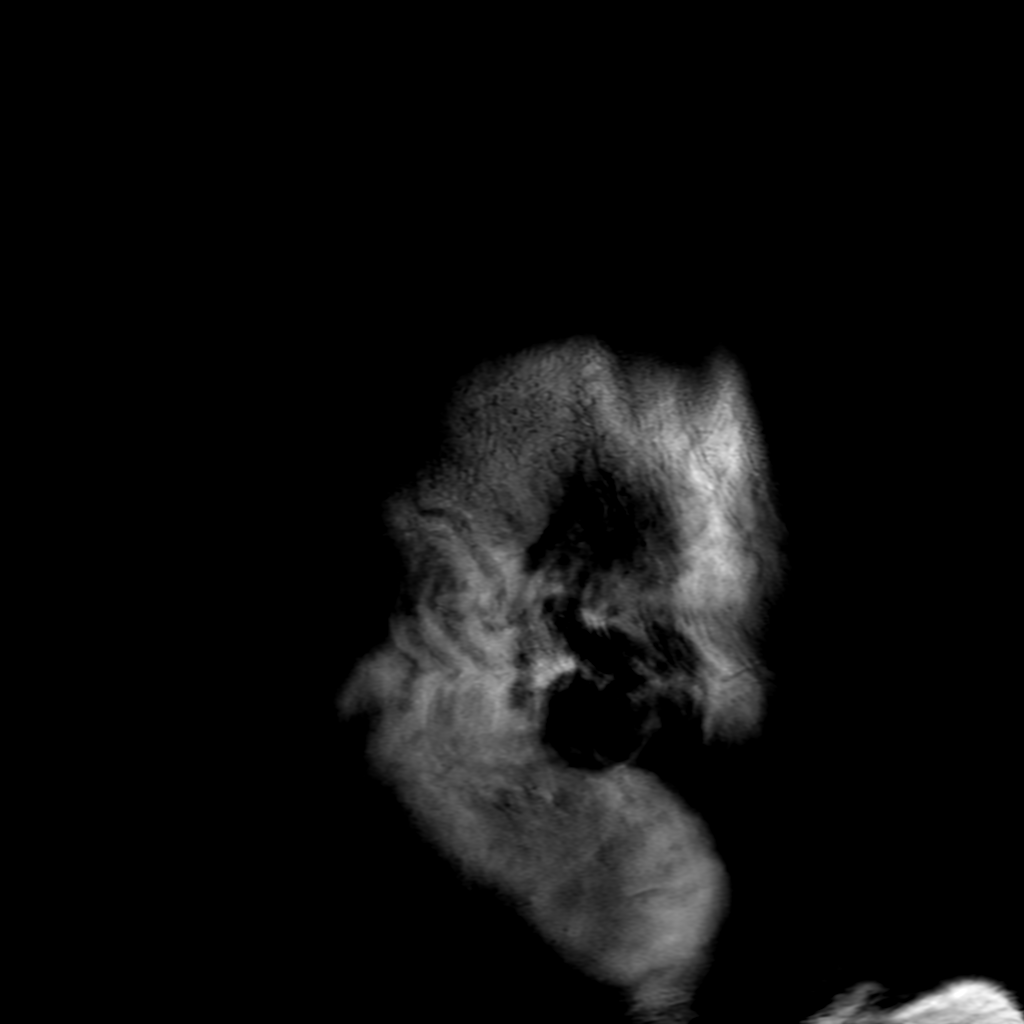
[im 12/24]
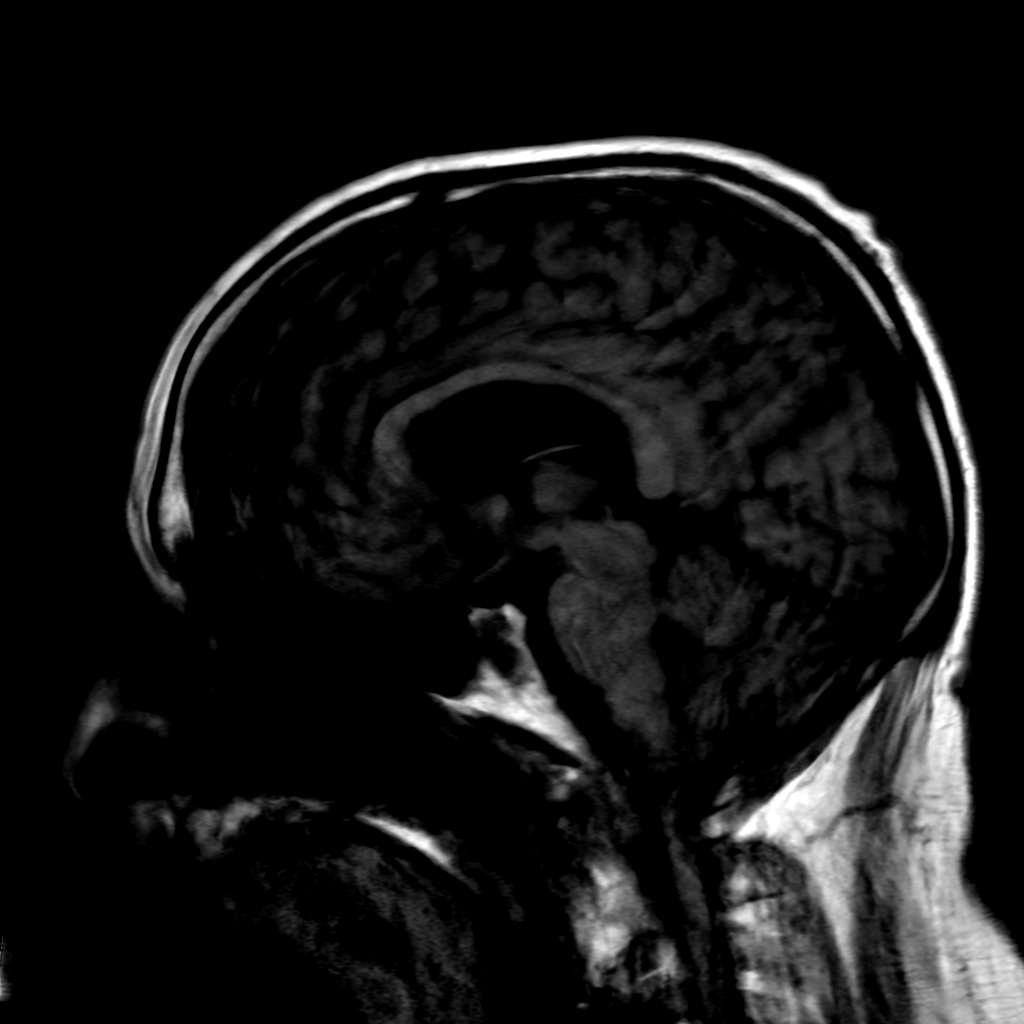
[im 24/24]
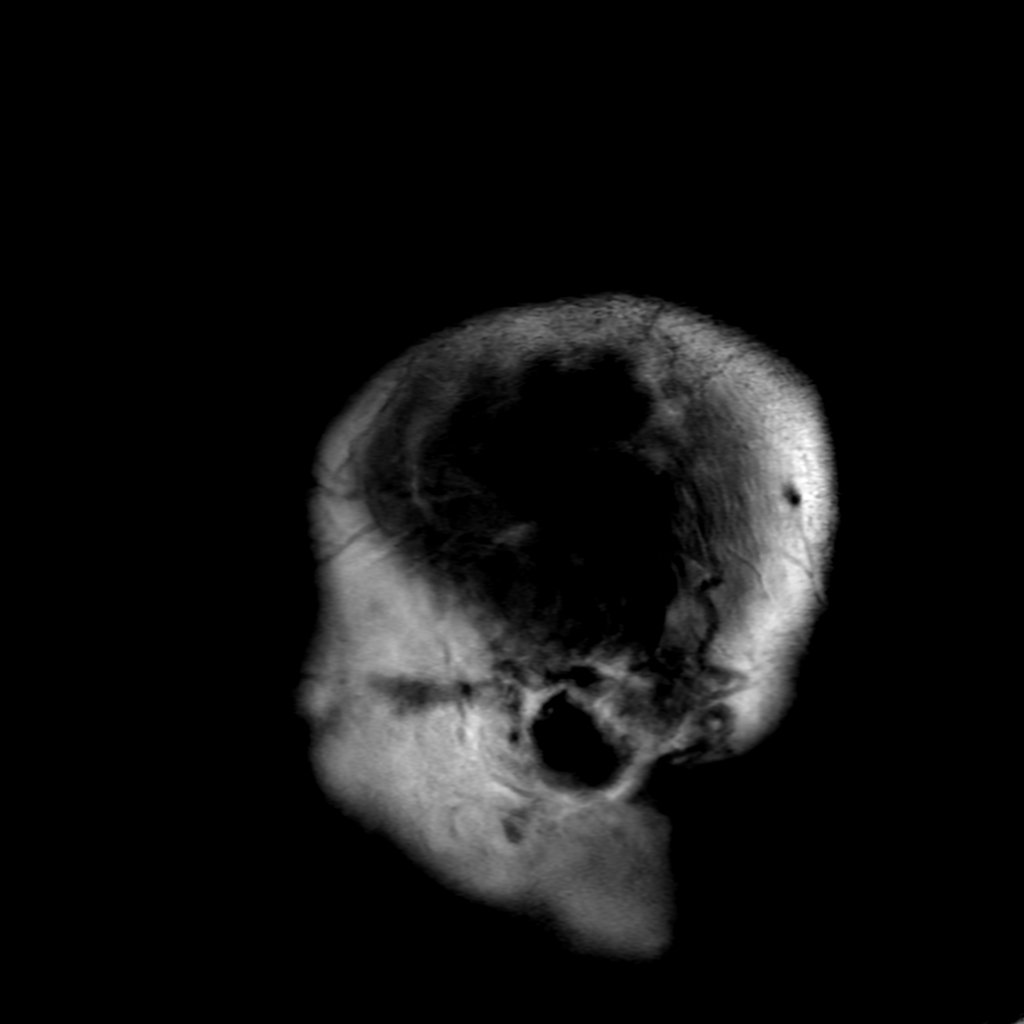

[Series 6: FLAIR · axial · 5.0mm · 0.47mm/px · z∈[-93,+63]mm · 3 of 27 slices shown (2 of 2)]
[im 1/27]
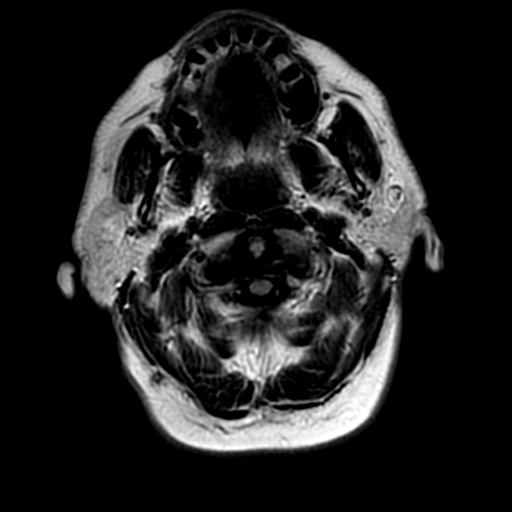
[im 14/27]
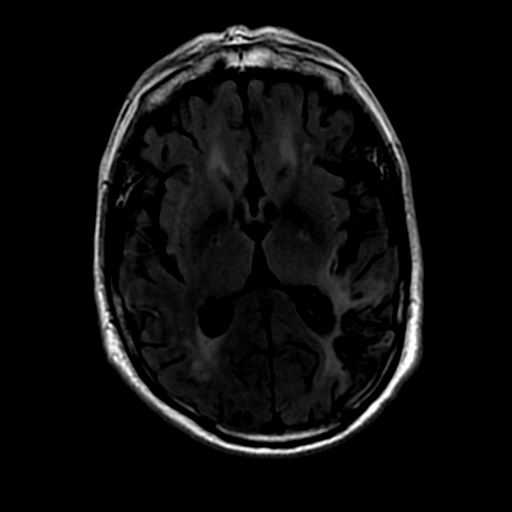
[im 27/27]
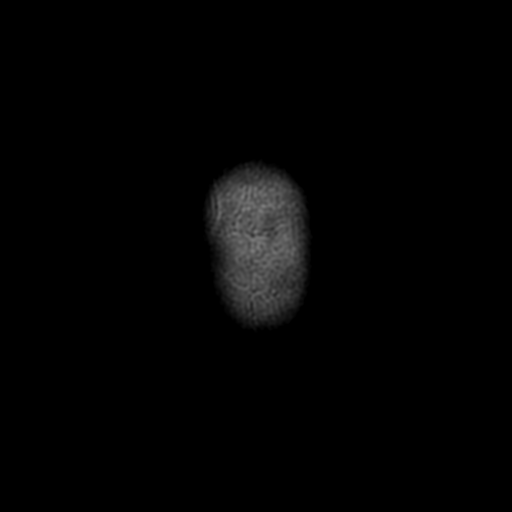

[13 of 48 positions shown; findings below may reference images not displayed]

FINDINGS: Brain:

Multiple sequences are significantly motion degraded, limiting
evaluation. Most notably, there is moderate/severe motion
degradation of the sagittal T1 weighted sequence, moderate/severe
motion degradation of the coronal T2 weighted sequence and moderate
motion degradation of the axial T1 weighted sequence. There is also
mild-to-moderate d motion degradation of the axial
diffusion-weighted sequence.

There is an 8 mm focus of restricted diffusion consistent with acute
infarction within the left parietal white matter (series 2, image
32). Redemonstrated chronic cortically based infarcts within the
left frontoparietal operculum and left temporoparietal junction as
well as portions of the left occipital lobe. Additional small
chronic cortically based infarct within the anterolateral left
frontal lobe. As before, there is chronic hemosiderin deposition
associated with infarcts in the anterior left frontal lobe and left
temporoparietal junction. Redemonstrated small chronic lacunar
infarct within the right lentiform nucleus. A small chronic lacunar
infarct within the left lentiform nucleus was not present on prior
MRI. Background advanced chronic small vessel ischemic disease is
unchanged. Stable, mild generalized parenchymal atrophy.

Vascular: Flow voids maintained within the proximal large arterial
vessels.

Skull and upper cervical spine: No focal marrow lesion is identified
within the limitations of motion degradation.

Sinuses/Orbits: Visualized orbits demonstrate no acute abnormality.
Mild ethmoid sinus mucosal thickening. No significant mastoid
effusion.
IMPRESSION: 1. Significantly motion degraded examination as described.
2. 8 mm acute infarct within the left parietal white matter.
3. Redemonstration of multiple chronic cortically based infarcts
within the left cerebral hemisphere.
4. A chronic lacunar infarct within the left lentiform nucleus is
new from MRI 02/09/2019. Chronic right lentiform nucleus lacunar
infarct and background advanced chronic small vessel ischemic
disease, unchanged.
5. Stable, mild generalized parenchymal atrophy.

ADDENDUM:
Please note diffusion weighted signal abnormality was present at the
site of the 8 mm focus of restricted diffusion demonstrated on the
current MRI. On the current exam, findings may reflect persistent
restricted diffusion at site of a chronic infarct. However, a small
focus of acute ischemia and acute infarction cannot be excluded.

Addended findings discussed with Dr. Bye by telephone at the [DATE]
p.m. on 09/07/2019 at [DATE].

*** End of Addendum ***
FINDINGS: Brain:

Multiple sequences are significantly motion degraded, limiting
evaluation. Most notably, there is moderate/severe motion
degradation of the sagittal T1 weighted sequence, moderate/severe
motion degradation of the coronal T2 weighted sequence and moderate
motion degradation of the axial T1 weighted sequence. There is also
mild-to-moderate d motion degradation of the axial
diffusion-weighted sequence.

There is an 8 mm focus of restricted diffusion consistent with acute
infarction within the left parietal white matter (series 2, image
32). Redemonstrated chronic cortically based infarcts within the
left frontoparietal operculum and left temporoparietal junction as
well as portions of the left occipital lobe. Additional small
chronic cortically based infarct within the anterolateral left
frontal lobe. As before, there is chronic hemosiderin deposition
associated with infarcts in the anterior left frontal lobe and left
temporoparietal junction. Redemonstrated small chronic lacunar
infarct within the right lentiform nucleus. A small chronic lacunar
infarct within the left lentiform nucleus was not present on prior
MRI. Background advanced chronic small vessel ischemic disease is
unchanged. Stable, mild generalized parenchymal atrophy.

Vascular: Flow voids maintained within the proximal large arterial
vessels.

Skull and upper cervical spine: No focal marrow lesion is identified
within the limitations of motion degradation.

Sinuses/Orbits: Visualized orbits demonstrate no acute abnormality.
Mild ethmoid sinus mucosal thickening. No significant mastoid
effusion.
IMPRESSION: 1. Significantly motion degraded examination as described.
2. 8 mm acute infarct within the left parietal white matter.
3. Redemonstration of multiple chronic cortically based infarcts
within the left cerebral hemisphere.
4. A chronic lacunar infarct within the left lentiform nucleus is
new from MRI 02/09/2019. Chronic right lentiform nucleus lacunar
infarct and background advanced chronic small vessel ischemic
disease, unchanged.
5. Stable, mild generalized parenchymal atrophy.

## 2020-07-20 ENCOUNTER — Ambulatory Visit (INDEPENDENT_AMBULATORY_CARE_PROVIDER_SITE_OTHER): Payer: PPO

## 2020-07-20 ENCOUNTER — Telehealth: Payer: Self-pay

## 2020-07-20 VITALS — Ht 70.0 in | Wt 195.0 lb

## 2020-07-20 DIAGNOSIS — Z0000 Encounter for general adult medical examination without abnormal findings: Secondary | ICD-10-CM

## 2020-07-20 NOTE — Telephone Encounter (Signed)
Patient consented to telehealth visit. 

## 2020-07-20 NOTE — Patient Instructions (Signed)
[name] , Thank you for taking time to come for your Medicare Wellness Visit. I appreciate your ongoing commitment to your health goals. Please review the following plan we discussed and let me know if I can assist you in the future.   Screening recommendations/referrals: Colonoscopy: completed 08/30/2012, due 08/30/2022 Recommended yearly ophthalmology/optometry visit for glaucoma screening and checkup Recommended yearly dental visit for hygiene and checkup  Vaccinations: Influenza vaccine: completed 03/20/2020, due 02/04/2021 Pneumococcal vaccine: completed 03/20/2020 Tdap vaccine: completed 07/17/2016, due 07/17/2026 Shingles vaccine: discussed   Covid-19:  01/16/2020, 12/19/2019  Advanced directives: copy in chart  Conditions/risks identified: none  Next appointment: Follow up in one year for your annual wellness visit.   Preventive Care 70 Years and Older, Male Preventive care refers to lifestyle choices and visits with your health care provider that can promote health and wellness. What does preventive care include?  A yearly physical exam. This is also called an annual well check.  Dental exams once or twice a year.  Routine eye exams. Ask your health care provider how often you should have your eyes checked.  Personal lifestyle choices, including:  Daily care of your teeth and gums.  Regular physical activity.  Eating a healthy diet.  Avoiding tobacco and drug use.  Limiting alcohol use.  Practicing safe sex.  Taking low doses of aspirin every day.  Taking vitamin and mineral supplements as recommended by your health care provider. What happens during an annual well check? The services and screenings done by your health care provider during your annual well check will depend on your age, overall health, lifestyle risk factors, and family history of disease. Counseling  Your health care provider may ask you questions about your:  Alcohol use.  Tobacco  use.  Drug use.  Emotional well-being.  Home and relationship well-being.  Sexual activity.  Eating habits.  History of falls.  Memory and ability to understand (cognition).  Work and work Statistician. Screening  You may have the following tests or measurements:  Height, weight, and BMI.  Blood pressure.  Lipid and cholesterol levels. These may be checked every 5 years, or more frequently if you are over 72 years old.  Skin check.  Lung cancer screening. You may have this screening every year starting at age 78 if you have a 30-pack-year history of smoking and currently smoke or have quit within the past 15 years.  Fecal occult blood test (FOBT) of the stool. You may have this test every year starting at age 1.  Flexible sigmoidoscopy or colonoscopy. You may have a sigmoidoscopy every 5 years or a colonoscopy every 10 years starting at age 56.  Prostate cancer screening. Recommendations will vary depending on your family history and other risks.  Hepatitis C blood test.  Hepatitis B blood test.  Sexually transmitted disease (STD) testing.  Diabetes screening. This is done by checking your blood sugar (glucose) after you have not eaten for a while (fasting). You may have this done every 1-3 years.  Abdominal aortic aneurysm (AAA) screening. You may need this if you are a current or former smoker.  Osteoporosis. You may be screened starting at age 77 if you are at high risk. Talk with your health care provider about your test results, treatment options, and if necessary, the need for more tests. Vaccines  Your health care provider may recommend certain vaccines, such as:  Influenza vaccine. This is recommended every year.  Tetanus, diphtheria, and acellular pertussis (Tdap, Td) vaccine. You  may need a Td booster every 10 years.  Zoster vaccine. You may need this after age 33.  Pneumococcal 13-valent conjugate (PCV13) vaccine. One dose is recommended after age  42.  Pneumococcal polysaccharide (PPSV23) vaccine. One dose is recommended after age 59. Talk to your health care provider about which screenings and vaccines you need and how often you need them. This information is not intended to replace advice given to you by your health care provider. Make sure you discuss any questions you have with your health care provider. Document Released: 07/20/2015 Document Revised: 03/12/2016 Document Reviewed: 04/24/2015 Elsevier Interactive Patient Education  2017 [place] Prevention in the Home Falls can cause injuries. They can happen to people of all ages. There are many things you can do to make your home safe and to help prevent falls. What can I do on the outside of my home?  Regularly fix the edges of walkways and driveways and fix any cracks.  Remove anything that might make you trip as you walk through a door, such as a raised step or threshold.  Trim any bushes or trees on the path to your home.  Use bright outdoor lighting.  Clear any walking paths of anything that might make someone trip, such as rocks or tools.  Regularly check to see if handrails are loose or broken. Make sure that both sides of any steps have handrails.  Any raised decks and porches should have guardrails on the edges.  Have any leaves, snow, or ice cleared regularly.  Use sand or salt on walking paths during winter.  Clean up any spills in your garage right away. This includes oil or grease spills. What can I do in the bathroom?  Use night lights.  Install grab bars by the toilet and in the tub and shower. Do not use towel bars as grab bars.  Use non-skid mats or decals in the tub or shower.  If you need to sit down in the shower, use a plastic, non-slip stool.  Keep the floor dry. Clean up any water that spills on the floor as soon as it happens.  Remove soap buildup in the tub or shower regularly.  Attach bath mats securely with double-sided  non-slip rug tape.  Do not have throw rugs and other things on the floor that can make you trip. What can I do in the bedroom?  Use night lights.  Make sure that you have a light by your bed that is easy to reach.  Do not use any sheets or blankets that are too big for your bed. They should not hang down onto the floor.  Have a firm chair that has side arms. You can use this for support while you get dressed.  Do not have throw rugs and other things on the floor that can make you trip. What can I do in the kitchen?  Clean up any spills right away.  Avoid walking on wet floors.  Keep items that you use a lot in easy-to-reach places.  If you need to reach something above you, use a strong step stool that has a grab bar.  Keep electrical cords out of the way.  Do not use floor polish or wax that makes floors slippery. If you must use wax, use non-skid floor wax.  Do not have throw rugs and other things on the floor that can make you trip. What can I do with my stairs?  Do not leave any items  on the stairs.  Make sure that there are handrails on both sides of the stairs and use them. Fix handrails that are broken or loose. Make sure that handrails are as long as the stairways.  Check any carpeting to make sure that it is firmly attached to the stairs. Fix any carpet that is loose or worn.  Avoid having throw rugs at the top or bottom of the stairs. If you do have throw rugs, attach them to the floor with carpet tape.  Make sure that you have a light switch at the top of the stairs and the bottom of the stairs. If you do not have them, ask someone to add them for you. What else can I do to help prevent falls?  Wear shoes that:  Do not have high heels.  Have rubber bottoms.  Are comfortable and fit you well.  Are closed at the toe. Do not wear sandals.  If you use a stepladder:  Make sure that it is fully opened. Do not climb a closed stepladder.  Make sure that both  sides of the stepladder are locked into place.  Ask someone to hold it for you, if possible.  Clearly mark and make sure that you can see:  Any grab bars or handrails.  First and last steps.  Where the edge of each step is.  Use tools that help you move around (mobility aids) if they are needed. These include:  Canes.  Walkers.  Scooters.  Crutches.  Turn on the lights when you go into a dark area. Replace any light bulbs as soon as they burn out.  Set up your furniture so you have a clear path. Avoid moving your furniture around.  If any of your floors are uneven, fix them.  If there are any pets around you, be aware of where they are.  Review your medicines with your doctor. Some medicines can make you feel dizzy. This can increase your chance of falling. Ask your doctor what other things that you can do to help prevent falls. This information is not intended to replace advice given to you by your health care provider. Make sure you discuss any questions you have with your health care provider. Document Released: 04/19/2009 Document Revised: 11/29/2015 Document Reviewed: 07/28/2014 Elsevier Interactive Patient Education  2017 [employer].

## 2020-07-20 NOTE — Progress Notes (Signed)
I connected with Andrew Cobb today by telephone and verified that I am speaking with the correct person using two identifiers. Location patient: home Location provider: work Persons participating in the virtual visit: Andrew, [place] LPN.   I discussed the limitations, risks, security and privacy concerns of performing an evaluation and management service by telephone and the availability of in person appointments. I also discussed with the patient that there may be a patient responsible charge related to this service. The patient expressed understanding and verbally consented to this telephonic visit.    Interactive audio and video telecommunications were attempted between this provider and patient, however failed, due to patient having technical difficulties OR patient did not have access to video capability.  We continued and completed visit with audio only.     Vital signs may be patient reported or missing.  Subjective:   Andrew Cobb is a 70 y.o. male who presents for Medicare Annual/Subsequent preventive examination.  Review of Systems     Cardiac Risk Factors include: advanced age (>70mn, >>87women);diabetes mellitus;hypertension;male gender     Objective:    Today's Vitals   07/20/20 1026 07/20/20 1027  Weight: 195 lb (88.5 kg)   Height: '5\' 10"'  (1.778 m)   PainSc:  8    Body mass index is 27.98 kg/m.  Advanced Directives 07/20/2020 07/10/2020 07/04/2020 06/07/2020 03/07/2020 09/07/2019 07/18/2019  Does Patient Have a Medical Advance Directive? Yes No Yes Yes Yes No Yes  Type of AParamed[place]of [place] - [place] Healthcare Power of [place] - Living will;Healthcare Power of Attorney  Does patient want to make changes to medical advance directive? - - - - - - -  Copy of [place] Chart? Yes - validated most recent copy scanned in  chart (See row information) - - No - copy requested No - copy requested - No - copy requested  Would patient like information on creating a medical advance directive? - No - Patient declined - No - Guardian declined No - Patient declined No - Patient declined -    Current Medications (verified) Outpatient Encounter Medications as of 07/20/2020  Medication Sig  . acyclovir (ZOVIRAX) 400 MG tablet Take 1 tablet (400 mg total) by mouth 2 (two) times daily.  .[name] Kitchenamitriptyline (ELAVIL) 25 MG tablet Take 1 tablet (25 mg total) by mouth at bedtime.  .[name] Kitchenatorvastatin (LIPITOR) 40 MG tablet Take 1 tablet (40 mg total) by mouth at bedtime.  . cetirizine (ZYRTEC) 10 MG tablet Take 10 mg by mouth daily.  . Cholecalciferol (VITAMIN D) 2000 units CAPS Take 2,000 Units by mouth daily.  . empagliflozin (JARDIANCE) 10 MG TABS tablet Take 10 mg by mouth daily before breakfast.  . Lancets (ONETOUCH ULTRASOFT) lancets 1 each by Other route 2 (two) times daily. Dx E11.9  . levETIRAcetam (KEPPRA) 500 MG tablet Take 1 tablet (500 mg total) by mouth 2 (two) times daily.  . metFORMIN (GLUCOPHAGE) 1000 MG tablet Take 1 tablet (1,000 mg total) by mouth 2 (two) times daily with a meal.  . Multiple Vitamins-Minerals (MULTIVITAMIN PO) Take 1 tablet by mouth daily.   .Glory RosebushULTRA test strip USE TWICE DAILY  . tamsulosin (FLOMAX) 0.4 MG CAPS capsule Take 1 capsule (0.4 mg total) by mouth at bedtime.  .[name] Kitchentelmisartan (MICARDIS) 80 MG tablet Take 0.5 tablets (40 mg total) by mouth daily.  . vitamin B-12 (CYANOCOBALAMIN) 1000  MCG tablet Take 1 tablet (1,000 mcg total) by mouth daily.  [name] Kitchen aspirin EC 81 MG EC tablet Take 1 tablet (81 mg total) by mouth daily. (Patient not taking: Reported on 07/20/2020)   No facility-administered encounter medications on file as of 07/20/2020.    Allergies (verified) Succinylsulphathiazole, Sulfamethoxazole-trimethoprim, and Tetracyclines & related   History: Past Medical History:  Diagnosis  Date  . Arthritis   . Asthma    has not needed since 6/21  . Cancer [facility])    prostate  . Chronic venous insufficiency    varicose vein lower extremity with inflammation  . Coronary artery disease 1996   two stents placed   . Diabetes mellitus without complication ([place])    type 2 on metformin  . GERD (gastroesophageal reflux disease)    no issues since gastric bypass surgery as stated per pt  . Hyperlipidemia   . Hypertension   . Hypogonadism in male   . MRSA (methicillin resistant Staphylococcus aureus) infection    07/30/2008 thru 08/07/2008  . Sleep apnea    on BIPAP  . Stented coronary artery   . Stroke ([place]) 09/07/2019  . Thrombocythemia    Past Surgical History:  Procedure Laterality Date  . ANGIOPLASTY    . ANGIOPLASTY     with stent 04/07/1995  . ANTERIOR CERVICAL DECOMPRESSION/DISCECTOMY FUSION 4 LEVELS N/A 08/17/2017   Procedure: Anterior discectomy with fusion and plate fixation Cervical Three-Four, Four-Five, Five-Six, and Six-Seven Fusion;  Surgeon: Ditty, Kevan Ny, MD;  Location: [place];  Service: Neurosurgery;  Laterality: N/A;  Anterior discectomy with fusion and plate fixation Cervical Three-Four, Four-Five, Five-Six, and Six-Seven Fusion   . APPENDECTOMY     1966  . BUBBLE STUDY  09/20/2018   Procedure: BUBBLE STUDY;  Surgeon: Josue Hector, MD;  Location: [place];  Service: Cardiovascular;;  . [place]  . CARDIOVASCULAR STRESS TEST     07/31/2011  . CARPAL TUNNEL RELEASE Left   . CHOLECYSTECTOMY     2006  . colonscopy      08/25/2012  . EYE SURGERY Bilateral    cataract  . FUNCTIONAL ENDOSCOPIC SINUS SURGERY     11/10/2013  . GASTRIC BYPASS     10/05/2012  . HERNIA REPAIR     left inguinal 1981  . INCISION AND DRAINAGE ABSCESS Right 02/26/2017   Procedure: INCISION AND DRAINAGE ABSCESS;  Surgeon: Nickie Retort, MD;  Location: [facility];  Service: Urology;  Laterality: Right;  . JOINT REPLACEMENT     bilateral  .  left ankle surgery      05/03/2003   . left carpel tunnel      09/18/1993  . left knee meniscal tear      01/25/2010  . left knee meniscal tear repair      05/04/1996  . left rotator cuff repair      05/03/2003   . LOOP RECORDER INSERTION N/A 09/20/2018   Procedure: LOOP RECORDER INSERTION;  Surgeon: Evans Lance, MD;  Location: [place] [facility];  Service: Cardiovascular;  Laterality: N/A;  . REPLACEMENT TOTAL KNEE BILATERAL  07/13/2015  . right ankle surgery      fracture has 2 screws 07/07/1997  . right carpel tunnel      05/16/1992  . right shoulder replacement      01/27/2006  . SCROTAL EXPLORATION Right 02/26/2017   Procedure: SCROTUM EXPLORATION;  Surgeon: Nickie Retort, MD;  Location: [facility];  Service: Urology;  Laterality: Right;  .  TEE WITHOUT CARDIOVERSION N/A 09/20/2018   Procedure: TRANSESOPHAGEAL ECHOCARDIOGRAM (TEE);  Surgeon: Josue Hector, MD;  Location: [facility];  Service: Cardiovascular;  Laterality: N/A;  . TOTAL KNEE ARTHROPLASTY Left 07/13/2015   Procedure: LEFT TOTAL KNEE ARTHROPLASTY;  Surgeon: Gaynelle Arabian, MD;  Location: [facility];  Service: Orthopedics;  Laterality: Left;   Family History  Problem Relation Age of Onset  . Cancer Mother        pancreatic  . Diabetes Mother   . Stroke Mother   . Heart disease Mother   . Hyperlipidemia Mother   . Hypertension Mother   . Heart attack Mother   . Heart disease Father   . Stroke Father   . Diabetes Father   . Hypertension Father   . Heart attack Father   . Hyperlipidemia Father   . Pancreatic cancer Father   . Cancer Sister   . Cancer Brother        lung  . Cancer Brother   . Kidney cancer Neg Hx   . Bladder Cancer Neg Hx   . Prostate cancer Neg Hx    Social History   Socioeconomic History  . Marital status: Married    Spouse name: Not on file  . Number of children: Not on file  . Years of education: Not on file  . Highest education level: Some college, no degree  Occupational  History    Comment: drives for [employer]   Tobacco Use  . Smoking status: Former Smoker    Packs/day: 1.00    Years: 10.00    Pack years: 10.00    Types: Cigarettes    Quit date: 07/07/1984    Years since quitting: 36.0  . Smokeless tobacco: Never Used  . Tobacco comment: quit 1986  Vaping Use  . Vaping Use: Never used  Substance and Sexual Activity  . Alcohol use: No    Alcohol/week: 0.0 standard drinks  . Drug use: No  . Sexual activity: Yes  Other Topics Concern  . Not on file  Social History Narrative  . Not on file   Social Determinants of Health   Financial Resource Strain: Low Risk   . Difficulty of Paying Living Expenses: Not hard at all  Food Insecurity: No Food Insecurity  . Worried About Charity fundraiser in the Last Year: Never true  . Ran Out of Food in the Last Year: Never true  Transportation Needs: No Transportation Needs  . Lack of Transportation (Medical): No  . Lack of Transportation (Non-Medical): No  Physical Activity: Sufficiently Active  . Days of Exercise per Week: 5 days  . Minutes of Exercise per Session: 40 min  Stress: No Stress Concern Present  . Feeling of Stress : Not at all  Social Connections: Socially Integrated  . Frequency of Communication with Friends and Family: More than three times a week  . Frequency of Social Gatherings with Friends and Family: More than three times a week  . Attends Religious Services: More than 4 times per year  . Active Member of Clubs or Organizations: Yes  . Attends Archivist Meetings: More than 4 times per year  . Marital Status: Married    Tobacco Counseling Counseling given: Not Answered Comment: quit 1986   Clinical Intake:  Pre-visit preparation completed: Yes  Pain : 0-10 Pain Score: 8  Pain Type: Chronic pain Pain Location: Shoulder Pain Orientation: Left Pain Descriptors / Indicators: Aching Pain Onset: More than a month ago Pain Frequency:  Constant     Nutritional  Status: BMI 25 -29 Overweight Nutritional Risks: None Diabetes: Yes  How often do you need to have someone help you when you read instructions, pamphlets, or other written materials from your doctor or pharmacy?: 1 - Never What is the last grade level you completed in school?: some college  Diabetic? Yes Nutrition Risk Assessment:  Has the patient had any N/V/D within the last 2 months?  No  Does the patient have any non-healing wounds?  No  Has the patient had any unintentional weight loss or weight gain?  No   Diabetes:  Is the patient diabetic?  Yes  If diabetic, was a CBG obtained today?  No  Did the patient bring in their glucometer from home?  No  How often do you monitor your CBG's? daily.   Financial Strains and Diabetes Management:  Are you having any financial strains with the device, your supplies or your medication? No .  Does the patient want to be seen by Chronic Care Management for management of their diabetes?  No  Would the patient like to be referred to a Nutritionist or for Diabetic Management?  No   Diabetic Exams:  Diabetic Eye Exam: Completed 06/12/2020 Diabetic Foot Exam: Completed 07/02/2020   Interpreter Needed?: No  Information entered by :: NAllen LPN   Activities of Daily Living In your present state of health, do you have any difficulty performing the following activities: 07/20/2020 07/12/2020  Hearing? N N  Comment - -  Vision? N N  Difficulty concentrating or making decisions? N N  Walking or climbing stairs? N N  Dressing or bathing? N N  Doing errands, shopping? N N  Preparing Food and eating ? N -  Using the Toilet? N -  In the past six months, have you accidently leaked urine? N -  Do you have problems with loss of bowel control? N -  Managing your Medications? N -  Managing your Finances? N -  Housekeeping or managing your Housekeeping? N -  Some recent data might be hidden    Patient Care Team: [name], NP as PCP -  General (Nurse Practitioner) Vanita Ingles, RN as Case Manager ([place]) Noreene Filbert, MD as Radiation Oncologist (Radiation Oncology) Vladimir Faster, [facility] as Pharmacist (Pharmacist)  Indicate any recent Medical Services you may have received from other than [facility] providers in the past year (date may be approximate).     Assessment:   This is a routine wellness examination for Deontrae.  Hearing/Vision screen  Hearing Screening   '125Hz'  '250Hz'  '500Hz'  '1000Hz'  '2000Hz'  '3000Hz'  '4000Hz'  '6000Hz'  '8000Hz'   Right ear:           Left ear:           Vision Screening Comments: Regular eye exams, Dr. Zadie Rhine  Dietary issues and exercise activities discussed: Current Exercise Habits: Home exercise routine, Type of exercise: Other - see comments (pool), Time (Minutes): 45, Frequency (Times/Week): 5, Weekly Exercise (Minutes/Week): 225  Goals    .  DIET - INCREASE WATER INTAKE      Recommend drinking at least 6-8 glasses of water a day     .  Patient Stated      07/20/2020, keep weight down    .  PharmD " I want to work on my diabetes" (pt-stated)      Current Barriers:  . Diabetes: controlled, most recent A1c 6.3% ; goal <6.5% per neurology; complicated by hx CVA,  possible seizure activity, prostate cancer o Patient completed last radiation treatment for prostate CA on 7/29. Appt with Dr. Eulas Post,  Oncology 9/2- doing well, low SE profile folow up PSA and appt in 3 months o Oxygen therapy has been discontinued  Switch from ACEi to ARB in April benefited breathing and cough . Current antihyperglycemic regimen: metformin 1000 mg BID, Jardiance 10 mg daily  o APPROVED for Jardiance assistance through 07/06/20 . Current glucose readings:  o Fasting 90-110s, not checking post prandials . Current exercise regimen:  o He is currently going to the [facility] 5 days per week. He enjoys the water aerobics classes. Reports he feels really good. . Cardiovascular risk reduction (post 3 CVA in past year, most  recent admission 02/2019 o Current hypertensive regimen: telmisartan 80 mg daily, BP at goal 110s/70, denies s/sx hypotension o Current hyperlipidemia regimen: atorvastatin 40 mg daily, last LDL very well controlled <70; ASA 81 mg daily (avoiding clopidogrel d/t recent hemorrhagic stroke) . Seizure activity: levatiracetam 500 mg BID. Outpatient EEG normal. Neurology will re-evaluate need for this therapy moving forward if repeat EEG normal. Follow up scheduled in November.  Pharmacist Clinical Goal(s):  [name] Kitchen Over the next 90 days, patient with work with PharmD and primary care provider to address optimized hyperglycemic management  Interventions: . Comprehensive medication review performed, medication list updated in electronic medical record . Inter-disciplinary care team collaboration (see longitudinal plan of care) . Reviewed goal A1c, goal fasting, and goal 2 hour post prandial glucose. Praised patient for attaining these goals and establishing exercise regimen.  Patient Self Care Activities:  . Patient will check blood glucose daily, document, and provide at future appointments . Patient will take medications as prescribed . Patient will check BP daily, document, and provide at future appointments. . Patient will report any questions or concerns to provider   Please see past updates related to this goal by clicking on the "Past Updates" button in the selected goal      .  RNCM: "I am feeling better since getting out of the hospital" (pt-stated)      [place] (see longtitudinal plan of care for additional care plan information)  Current Barriers:  . Chronic Disease Management support, education, and care coordination needs related to HTN, HLD, DMII, and Pulmonary Disease  Clinical Goal(s) related to HTN, HLD, DMII, and Pulmonary Disease:  Over the next 120 days, patient will:  . Work with the care management team to address educational, disease management, and care coordination  needs  . Begin or continue self health monitoring activities as directed today Measure and record cbg (blood glucose) 2 times daily, Measure and record blood pressure 5 times per week, and adhere to a Heart healthy/ADA diet . Call provider office for new or worsened signs and symptoms Blood glucose findings outside established parameters, Blood pressure findings outside established parameters, Oxygen saturation lower than established parameter, Shortness of breath, and New or worsened symptom related to recent hospitalization due to pneumonia and the need to come home on supplemental oxygen . Call care management team with questions or concerns . Verbalize basic understanding of patient centered plan of care established today  Interventions related to HTN, HLD, DMII, and Pulmonary Disease:  . Evaluation of current treatment plans and patient's adherence to plan as established by provider. Patient saw pcp 09/19/2019 post discharge from the hospital. The patient states he is doing much better and is not even having to use the oxygen as much. His oxygen  sats are staying 95 to 98%.  Is using the oxygen 2 liters via [place] at night. The patient also verbalized that his blood sugars have come down. States they have been in the 120's to 130's.  Patient verbalized his blood pressure is "great". The patient verbalized today that he is no longer using oxygen at all and is turning it back in. The patient was at the gym working out. He is doing 5 miles a day at the gym.  His blood sugars are 105-110 and his blood pressure is systolic 409-735 and diastolic 32-99.  Praised for keeping good track of his numbers. Praised for exercise goal being met. The patient verbalized he feels great. 05-23-2020: The patient is doing well. The patient has been busy doing his [employer] job and having a lot of fun. He is booked up the rest of November and December. He states he will have his shoulder surgery as an outpatient on 07-12-2020.  The  patient will be glad to get his shoulder done as an outpatient. He said it has been needed for a long time.  . Assessed patient understanding of disease states: Patient states he understands his conditions well and is compliant with plan of care and medication regimen  . Assessed patient's education and care coordination needs- working with CCM pharmacist. Denies any needs at this time . Provided disease specific education to patient: Education and support given on Heart healthy/ADA diet. The patient states he does not eat salt. 05-23-2020: the patient endorses compliance with a heart healthy/ADA diet. The patient states he has lost about 20 pounds. He states that his blood sugars are 90 to 105 with last hemoglobin A1C of 6.7.  He states that his blood pressures are good at 242-683 systolic and 41-96 diastolic. Review of staying hydrated and eating well while staying busy on his job as [place].  [name] with appropriate clinical care team members regarding patient needs . Evaluation of upcoming appointments. The patient sees the pcp on 07-02-2020 at 0840 am for follow up.  Patient Self Care Activities related to HTN, HLD, DMII, and Pulmonary Disease:  . Patient is unable to independently self-manage chronic health conditions  Please see past updates related to this goal by clicking on the "Past Updates" button in the selected goal      .  RNCM: Pt-"I have prostate cancer" (pt-stated)      CARE PLAN ENTRY (see longitudinal plan of care for additional care plan information)  Current Barriers:  [name] Kitchen Knowledge Deficits related to pathophysiology of prostate cancer . Care Coordination needs related to treatment and support in a patient with prostate cancer (disease states) . Chronic Disease Management support and education needs related to prostate cancer  Nurse Case Manager Clinical Goal(s):  [name] Kitchen Over the next 120 days, patient will verbalize understanding of plan for treatment of prostate  cancer . Over the next 120 days, patient will work with [facility], [place] team, and pcp to address needs related to prostate cancer  . Over the next 120 days, patient will attend all scheduled medical appointments: the patient has several upcoming appointments for treatment of prostate cancer. The patient is happy that he will be able to take his treatments in [facility] . Over the next 120 days, patient will verbalize basic understanding of prostate disease process and self health management plan as evidenced by remaining independent during treatments and notifying team of changes in condition.   Interventions:  . Inter-disciplinary care team collaboration (see  longitudinal plan of care) . Evaluation of current treatment plan related to prostate cancer treatment  and patient's adherence to plan as established by provider. 03-07-2020: the patient has finished his radiation treatments about 2 weeks ago. The patient will have blood work and testing in about 3 months to evaluate the effectiveness of the radiation. The patient states he lost about 20 pounds but he is doing well. 05-23-2020: No new developments at this time. Follows up with provider as needed.  . Advised patient to call the RNCM or pcp for any needs or changes in condition . Provided education to patient re: taking medications as prescribed, following recommendations of cancer specialist, and keeping appointments  . Discussed plans with patient for ongoing care management follow up and provided patient with direct contact information for care management team . Reviewed scheduled/upcoming provider appointments including: The patient has several upcoming appointments. The patient will have seed placement on 11/24/2019 and will start 40 treatments of radiation on 11/28/2019.  He will go for radiation 5 days a week for a total of 40 treatments. 05-23-2020: The patient has successfully completed his treatments. The patient feels the radiation treatments  went well.  . Review of support system and available referrals to help the patient. The patient verbalized he is doing well now but if he has any needs he will let the RNCM know.   Patient Self Care Activities:  . Patient verbalizes understanding of plan to effectively treat prostate cancer . Self administers medications as prescribed . Attends all scheduled provider appointments . Performs ADL's independently . Performs IADL's independently . Calls provider office for new concerns or questions . Unable to independently manage prostate cancer   Please see past updates related to this goal by clicking on the "Past Updates" button in the selected goal        Depression Screen PHQ 2/9 Scores 07/20/2020 07/02/2020 10/18/2019 09/26/2019 07/18/2019 06/21/2019 10/13/2018  PHQ - 2 Score 0 0 0 0 0 0 0  PHQ- 9 Score - - 0 - - - -    Fall Risk Fall Risk  07/20/2020 07/02/2020 07/18/2019 06/21/2019 08/09/2018  Falls in the past year? 1 0 1 0 0  Comment tripped - - - -  Number falls in past yr: 0 0 0 0 0  Injury with Fall? 0 0 0 0 0  Risk for fall due to : History of fall(s);Medication side effect - - - -  Follow up Falls evaluation completed;Education provided;Falls prevention discussed - - Falls evaluation completed -    FALL RISK PREVENTION PERTAINING TO THE HOME:  Any stairs in or around the home? No  If so, are there any without handrails? n/a Home free of loose throw rugs in walkways, pet beds, electrical cords, etc? Yes  Adequate lighting in your home to reduce risk of falls? Yes   ASSISTIVE DEVICES UTILIZED TO PREVENT FALLS:  Life alert? No  Use of a cane, walker or w/c? No  Grab bars in the bathroom? No  Shower chair or bench in shower? No  Elevated toilet seat or a handicapped toilet? Yes   TIMED UP AND GO:  Was the test performed? No .   Cognitive Function:     6CIT Screen 07/20/2020 04/22/2018 04/20/2017  What Year? 0 points 0 points 0 points  What month? 0 points 0 points  0 points  What time? 0 points 0 points 0 points  Count back from 20 0 points 0 points 0 points  Months  in reverse 4 points 0 points -  Repeat phrase 6 points 0 points 0 points  Total Score 10 0 -    Immunizations Immunization History  Administered Date(s) Administered  . Fluad Quad(high Dose 65+) 03/16/2019, 03/20/2020  . Influenza Whole 04/09/2009  . Influenza, High Dose Seasonal PF 04/06/2017, 04/22/2018  . Influenza,inj,Quad PF,6+ Mos 04/24/2015  . Influenza-Unspecified 03/07/2014, 04/06/2017  . Moderna Sars-Covid-2 Vaccination 12/19/2019, 01/16/2020  . Pneumococcal Conjugate-13 03/20/2020  . Td 03/12/2005  . Tdap 07/17/2016  . Zoster 07/17/2016    TDAP status: Up to date  Flu Vaccine status: Up to date  Pneumococcal vaccine status: Up to date  Covid-19 vaccine status: Completed vaccines  Qualifies for Shingles Vaccine? Yes   Zostavax completed Yes   Shingrix Completed?: No.    Education has been provided regarding the importance of this vaccine. Patient has been advised to call insurance company to determine out of pocket expense if they have not yet received this vaccine. Advised may also receive vaccine at local pharmacy or Health Dept. Verbalized acceptance and understanding.  Screening Tests Health Maintenance  Topic Date Due  . COVID-19 Vaccine (3 - Moderna risk 4-dose series) 02/13/2020  . HEMOGLOBIN A1C  01/08/2021  . PNA vac Low Risk Adult (2 of 2 - PPSV23) 03/20/2021  . OPHTHALMOLOGY EXAM  06/12/2021  . FOOT EXAM  07/02/2021  . COLONOSCOPY (Pts 45-8yr Insurance coverage will need to be confirmed)  08/24/2022  . TETANUS/TDAP  07/17/2026  . INFLUENZA VACCINE  Completed  . Hepatitis C Screening  Completed    Health Maintenance  Health Maintenance Due  Topic Date Due  . COVID-19 Vaccine (3 - Moderna risk 4-dose series) 02/13/2020    Colorectal cancer screening: Type of screening: Colonoscopy. Completed 08/30/2012. Repeat every 10 years  Lung Cancer  Screening: (Low Dose CT Chest recommended if Age 70-80years, 30 pack-year currently smoking OR have quit w/in 15years.) does not qualify.   Lung Cancer Screening Referral: no  Additional Screening:  Hepatitis C Screening: does qualify; Completed 07/17/2016  Vision Screening: Recommended annual ophthalmology exams for early detection of glaucoma and other disorders of the eye. Is the patient up to date with their annual eye exam?  Yes  Who is the provider or what is the name of the office in which the patient attends annual eye exams? Dr. RZadie RhineIf pt is not established with a provider, would they like to be referred to a provider to establish care? No .   Dental Screening: Recommended annual dental exams for proper oral hygiene  Community Resource Referral / Chronic Care Management: CRR required this visit?  No   CCM required this visit?  No      Plan:     I have personally reviewed and noted the following in the patient's chart:   . Medical and social history . Use of alcohol, tobacco or illicit drugs  . Current medications and supplements . Functional ability and status . Nutritional status . Physical activity . Advanced directives . List of other physicians . Hospitalizations, surgeries, and ER visits in previous 12 months . Vitals . Screenings to include cognitive, depression, and falls . Referrals and appointments  In addition, I have reviewed and discussed with patient certain preventive protocols, quality metrics, and best practice recommendations. A written personalized care plan for preventive services as well as general preventive health recommendations were provided to patient.     NKellie Simmering LPN   [id number]  Nurse Notes:

## 2020-07-24 ENCOUNTER — Telehealth: Payer: PPO | Admitting: Nurse Practitioner

## 2020-07-29 LAB — CUP PACEART REMOTE DEVICE CHECK
Date Time Interrogation Session: 20220121005345
Implantable Pulse Generator Implant Date: 20200316

## 2020-07-30 ENCOUNTER — Inpatient Hospital Stay: Payer: PPO | Admitting: Nurse Practitioner

## 2020-07-30 ENCOUNTER — Ambulatory Visit (INDEPENDENT_AMBULATORY_CARE_PROVIDER_SITE_OTHER): Payer: PPO

## 2020-07-30 DIAGNOSIS — I639 Cerebral infarction, unspecified: Secondary | ICD-10-CM | POA: Diagnosis not present

## 2020-08-07 ENCOUNTER — Ambulatory Visit (INDEPENDENT_AMBULATORY_CARE_PROVIDER_SITE_OTHER): Payer: PPO | Admitting: Ophthalmology

## 2020-08-07 ENCOUNTER — Encounter (INDEPENDENT_AMBULATORY_CARE_PROVIDER_SITE_OTHER): Payer: Self-pay | Admitting: Ophthalmology

## 2020-08-07 ENCOUNTER — Other Ambulatory Visit: Payer: Self-pay

## 2020-08-07 DIAGNOSIS — H353211 Exudative age-related macular degeneration, right eye, with active choroidal neovascularization: Secondary | ICD-10-CM

## 2020-08-07 DIAGNOSIS — H35721 Serous detachment of retinal pigment epithelium, right eye: Secondary | ICD-10-CM | POA: Diagnosis not present

## 2020-08-07 MED ORDER — BEVACIZUMAB 2.5 MG/0.1ML IZ SOSY
2.5000 mg | PREFILLED_SYRINGE | INTRAVITREAL | Status: AC | PRN
Start: 2020-08-07 — End: 2020-08-07
  Administered 2020-08-07: 2.5 mg via INTRAVITREAL

## 2020-08-07 NOTE — Progress Notes (Signed)
08/07/2020     CHIEF COMPLAINT Patient presents for Retina Follow Up (8 Week Wet AMD f\u OD. Possible Avastin OD. OCT/Pt states no changes in vision. Denies new complaints./BGL: 125/A1C: 6.7)   HISTORY OF PRESENT ILLNESS: Andrew Cobb is a 69 y.o. male who presents to the clinic today for:   HPI    Retina Follow Up    Patient presents with  Wet AMD.  In right eye.  Severity is moderate.  Duration of 8 weeks.  Since onset it is stable.  I, the attending physician,  performed the HPI with the patient and updated documentation appropriately. Additional comments: 8 Week Wet AMD f\u OD. Possible Avastin OD. OCT Pt states no changes in vision. Denies new complaints. BGL: 125 A1C: 6.7       Last edited by [name] on 08/07/2020 10:21 AM. (History)      Referring physician: [name], NP [place],  [place] [address]  HISTORICAL INFORMATION:   Selected notes from the MEDICAL RECORD NUMBER    Lab Results  Component Value Date   HGBA1C 6.3 (H) 07/11/2020     CURRENT MEDICATIONS: No current outpatient medications on file. (Ophthalmic Drugs)   No current facility-administered medications for this visit. (Ophthalmic Drugs)   Current Outpatient Medications (Other)  Medication Sig  . acyclovir (ZOVIRAX) 400 MG tablet Take 1 tablet (400 mg total) by mouth 2 (two) times daily.  [name] Kitchen amitriptyline (ELAVIL) 25 MG tablet Take 1 tablet (25 mg total) by mouth at bedtime.  [name] Kitchen aspirin EC 81 MG EC tablet Take 1 tablet (81 mg total) by mouth daily. (Patient not taking: Reported on 07/20/2020)  . atorvastatin (LIPITOR) 40 MG tablet Take 1 tablet (40 mg total) by mouth at bedtime.  . cetirizine (ZYRTEC) 10 MG tablet Take 10 mg by mouth daily.  . Cholecalciferol (VITAMIN D) 2000 units CAPS Take 2,000 Units by mouth daily.  . empagliflozin (JARDIANCE) 10 MG TABS tablet Take 10 mg by mouth daily before breakfast.  . Lancets (ONETOUCH ULTRASOFT) lancets 1 each by Other route 2  (two) times daily. Dx E11.9  . levETIRAcetam (KEPPRA) 500 MG tablet Take 1 tablet (500 mg total) by mouth 2 (two) times daily.  . metFORMIN (GLUCOPHAGE) 1000 MG tablet Take 1 tablet (1,000 mg total) by mouth 2 (two) times daily with a meal.  . Multiple Vitamins-Minerals (MULTIVITAMIN PO) Take 1 tablet by mouth daily.   [name] ULTRA test strip USE TWICE DAILY  . tamsulosin (FLOMAX) 0.4 MG CAPS capsule Take 1 capsule (0.4 mg total) by mouth at bedtime.  [name] Kitchen telmisartan (MICARDIS) 80 MG tablet Take 0.5 tablets (40 mg total) by mouth daily.  . vitamin B-12 (CYANOCOBALAMIN) 1000 MCG tablet Take 1 tablet (1,000 mcg total) by mouth daily.   No current facility-administered medications for this visit. (Other)      REVIEW OF SYSTEMS: ROS    Positive for: Endocrine   Last edited by [name] on 08/07/2020 10:21 AM. (History)       ALLERGIES Allergies  Allergen Reactions  . Succinylsulphathiazole Rash  . Sulfamethoxazole-Trimethoprim Rash  . Tetracyclines & Related Rash    PAST MEDICAL HISTORY Past Medical History:  Diagnosis Date  . Arthritis   . Asthma    has not needed since 6/21  . Cancer [facility])    prostate  . Chronic venous insufficiency    varicose vein lower extremity with inflammation  . Coronary artery disease 1996  two stents placed   . Diabetes mellitus without complication ([place])    type 2 on metformin  . GERD (gastroesophageal reflux disease)    no issues since gastric bypass surgery as stated per pt  . Hyperlipidemia   . Hypertension   . Hypogonadism in male   . MRSA (methicillin resistant Staphylococcus aureus) infection    07/30/2008 thru 08/07/2008  . Sleep apnea    on BIPAP  . Stented coronary artery   . Stroke ([place]) 09/07/2019  . Thrombocythemia    Past Surgical History:  Procedure Laterality Date  . ANGIOPLASTY    . ANGIOPLASTY     with stent 04/07/1995  . ANTERIOR CERVICAL DECOMPRESSION/DISCECTOMY FUSION 4 LEVELS N/A 08/17/2017   Procedure:  Anterior discectomy with fusion and plate fixation Cervical Three-Four, Four-Five, Five-Six, and Six-Seven Fusion;  Surgeon: Ditty, Kevan Ny, MD;  Location: [place];  Service: Neurosurgery;  Laterality: N/A;  Anterior discectomy with fusion and plate fixation Cervical Three-Four, Four-Five, Five-Six, and Six-Seven Fusion   . APPENDECTOMY     1966  . BUBBLE STUDY  09/20/2018   Procedure: BUBBLE STUDY;  Surgeon: Josue Hector, MD;  Location: [place];  Service: Cardiovascular;;  . [place]  . CARDIOVASCULAR STRESS TEST     07/31/2011  . CARPAL TUNNEL RELEASE Left   . CHOLECYSTECTOMY     2006  . colonscopy      08/25/2012  . EYE SURGERY Bilateral    cataract  . FUNCTIONAL ENDOSCOPIC SINUS SURGERY     11/10/2013  . GASTRIC BYPASS     10/05/2012  . HERNIA REPAIR     left inguinal 1981  . INCISION AND DRAINAGE ABSCESS Right 02/26/2017   Procedure: INCISION AND DRAINAGE ABSCESS;  Surgeon: Nickie Retort, MD;  Location: [facility];  Service: Urology;  Laterality: Right;  . JOINT REPLACEMENT     bilateral  . left ankle surgery      05/03/2003   . left carpel tunnel      09/18/1993  . left knee meniscal tear      01/25/2010  . left knee meniscal tear repair      05/04/1996  . left rotator cuff repair      05/03/2003   . LOOP RECORDER INSERTION N/A 09/20/2018   Procedure: LOOP RECORDER INSERTION;  Surgeon: Evans Lance, MD;  Location: [facility];  Service: Cardiovascular;  Laterality: N/A;  . REPLACEMENT TOTAL KNEE BILATERAL  07/13/2015  . right ankle surgery      fracture has 2 screws 07/07/1997  . right carpel tunnel      05/16/1992  . right shoulder replacement      01/27/2006  . SCROTAL EXPLORATION Right 02/26/2017   Procedure: SCROTUM EXPLORATION;  Surgeon: Nickie Retort, MD;  Location: [facility];  Service: Urology;  Laterality: Right;  . TEE WITHOUT CARDIOVERSION N/A 09/20/2018   Procedure: TRANSESOPHAGEAL ECHOCARDIOGRAM (TEE);  Surgeon:  Josue Hector, MD;  [facility];  Service: Cardiovascular;  Laterality: N/A;  . TOTAL KNEE ARTHROPLASTY Left 07/13/2015   Procedure: LEFT TOTAL KNEE ARTHROPLASTY;  Surgeon: Gaynelle Arabian, MD;  Location: [facility];  Service: Orthopedics;  Laterality: Left;    FAMILY HISTORY Family History  Problem Relation Age of Onset  . Cancer Mother        pancreatic  . Diabetes Mother   . Stroke Mother   . Heart disease Mother   . Hyperlipidemia Mother   . Hypertension Mother   .  Heart attack Mother   . Heart disease Father   . Stroke Father   . Diabetes Father   . Hypertension Father   . Heart attack Father   . Hyperlipidemia Father   . Pancreatic cancer Father   . Cancer Sister   . Cancer Brother        lung  . Cancer Brother   . Kidney cancer Neg Hx   . Bladder Cancer Neg Hx   . Prostate cancer Neg Hx     SOCIAL HISTORY Social History   Tobacco Use  . Smoking status: Former Smoker    Packs/day: 1.00    Years: 10.00    Pack years: 10.00    Types: Cigarettes    Quit date: 07/07/1984    Years since quitting: 36.1  . Smokeless tobacco: Never Used  . Tobacco comment: quit 1986  Vaping Use  . Vaping Use: Never used  Substance Use Topics  . Alcohol use: No    Alcohol/week: 0.0 standard drinks  . Drug use: No         OPHTHALMIC EXAM: Base Eye Exam    Visual Acuity (Snellen - Linear)      Right Left   Dist  20/25 -2 20/25 -1       Tonometry (Tonopen, 10:25 AM)      Right Left   Pressure 16 14       Pupils      Pupils Dark Light Shape React APD   Right PERRL 4 3 Round Brisk None   Left PERRL 4 3 Round Brisk None       Visual Fields (Counting fingers)      Left Right    Full Full       Neuro/Psych    Oriented x3: Yes   Mood/Affect: Normal       Dilation    Right eye: 1.0% Mydriacyl, 2.5% Phenylephrine @ 10:25 AM        Slit Lamp and Fundus Exam    External Exam      Right Left   External Normal Normal       Slit Lamp Exam      Right  Left   Lids/Lashes Normal Normal   Conjunctiva/Sclera White and quiet White and quiet   Cornea Clear Clear   Anterior Chamber Deep and quiet Deep and quiet   Iris Round and reactive Round and reactive   Lens Posterior chamber intraocular lens Posterior chamber intraocular lens   Anterior Vitreous Normal Normal       Fundus Exam      Right Left   Posterior Vitreous Posterior vitreous detachment    Disc Normal    C/D Ratio 0.6    Macula Hard drusen, no exudates, Macular thickening, Soft drusen, Pigmented atrophy, Retinal pigment epithelial mottling, Retinal pigment epithelial detachment    Vessels Normal, , no DR    Periphery Normal           IMAGING AND PROCEDURES  Imaging and Procedures for 08/07/20  OCT, Retina - OU - Both Eyes       Right Eye Quality was good. Scan locations included subfoveal. Central Foveal Thickness: 338. Progression has worsened. Findings include abnormal foveal contour, subretinal fluid, pigment epithelial detachment.   Left Eye Quality was good. Scan locations included subfoveal. Central Foveal Thickness: 288. Findings include normal foveal contour, retinal drusen .   Notes Today OD at 8-week interval, small change with increased serous retinal detachment adjacent to the vascularized  pigment epithelial detachment nasal to the FAZ.  We will repeat injection today Avastin and examination again in 7 weeks to coincide 4 weeks post planned shoulder repair shock surgery  OS with no signs of active disease       Intravitreal Injection, Pharmacologic Agent - OD - Right Eye       Time Out 08/07/2020. 11:04 AM. Confirmed correct patient, procedure, site, and patient consented.   Anesthesia Topical anesthesia was used. Anesthetic medications included Akten 3.5%.   Procedure Preparation included Tobramycin 0.3%, 10% betadine to eyelids, 5% betadine to ocular surface. A 30 gauge needle was used.   Injection:  2.5 mg Bevacizumab (AVASTIN) 2.5mg /0.64mL  SOSY   NDC: 85027-741-28, Lot: [id number]   Route: Intravitreal, Site: Right Eye  Post-op Post injection exam found visual acuity of at least counting fingers. The patient tolerated the procedure well. There were no complications. The patient received written and verbal post procedure care education. Post injection medications were not given.                 ASSESSMENT/PLAN:  Exudative age-related macular degeneration of right eye with active choroidal neovascularization (HCC) Repeat occurrence and increase of subretinal fluid at 8-week interval.  Will repeat injection Avastin today and examination again in 7 weeks  Plan 6 weeks would coincide with a 3-week time.  After planned shoulder surgical repair.  Will thus extend to 7 weeks  Serous detachment of retinal pigment epithelium of right eye Associated with CNVM noted in wet AMD, active CNVM above      ICD-10-CM   1. Exudative age-related macular degeneration of right eye with active choroidal neovascularization (HCC)  H35.3211 OCT, Retina - OU - Both Eyes    Intravitreal Injection, Pharmacologic Agent - OD - Right Eye    bevacizumab (AVASTIN) SOSY 2.5 mg  2. Serous detachment of retinal pigment epithelium of right eye  H35.721     1.  2.  3.  Ophthalmic Meds Ordered this visit:  Meds ordered this encounter  Medications  . bevacizumab (AVASTIN) SOSY 2.5 mg       Return in about 7 weeks (around 09/25/2020) for dilate, OD, AVASTIN OCT.  There are no Patient Instructions on file for this visit.   Explained the diagnoses, plan, and follow up with the patient and they expressed understanding.  Patient expressed understanding of the importance of proper follow up care.   [name] Fitzroy Mikami M.D. Diseases & Surgery of the Retina and Vitreous [facility] [place] 08/07/20     Abbreviations: M myopia (nearsighted); A astigmatism; H hyperopia (farsighted); P presbyopia; Mrx spectacle prescription;  CTL contact  lenses; OD right eye; OS left eye; OU both eyes  XT exotropia; ET esotropia; PEK punctate epithelial keratitis; PEE punctate epithelial erosions; DES dry eye syndrome; MGD meibomian gland dysfunction; ATs artificial tears; PFAT's preservative free artificial tears; [place] nuclear sclerotic cataract; PSC posterior subcapsular cataract; ERM epi-retinal membrane; PVD posterior vitreous detachment; RD retinal detachment; DM diabetes mellitus; DR diabetic retinopathy; NPDR non-proliferative diabetic retinopathy; PDR proliferative diabetic retinopathy; CSME clinically significant macular edema; DME diabetic macular edema; dbh dot blot hemorrhages; CWS cotton wool spot; POAG primary open angle glaucoma; C/D cup-to-disc ratio; HVF humphrey visual field; GVF goldmann visual field; OCT optical coherence tomography; IOP intraocular pressure; BRVO Branch retinal vein occlusion; CRVO central retinal vein occlusion; CRAO central retinal artery occlusion; BRAO branch retinal artery occlusion; RT retinal tear; SB scleral buckle; PPV pars plana vitrectomy; VH Vitreous hemorrhage; PRP  panretinal laser photocoagulation; IVK intravitreal kenalog; VMT vitreomacular traction; MH Macular hole;  NVD neovascularization of the disc; NVE neovascularization elsewhere; AREDS age related eye disease study; ARMD age related macular degeneration; POAG primary open angle glaucoma; EBMD epithelial/anterior basement membrane dystrophy; ACIOL anterior chamber intraocular lens; IOL intraocular lens; PCIOL posterior chamber intraocular lens; Phaco/IOL phacoemulsification with intraocular lens placement; [place] photorefractive keratectomy; LASIK laser assisted in situ keratomileusis; HTN hypertension; DM diabetes mellitus; COPD chronic obstructive pulmonary disease

## 2020-08-07 NOTE — Assessment & Plan Note (Signed)
Repeat occurrence and increase of subretinal fluid at 8-week interval.  Will repeat injection Avastin today and examination again in 7 weeks  Plan 6 weeks would coincide with a 3-week time.  After planned shoulder surgical repair.  Will thus extend to 7 weeks

## 2020-08-07 NOTE — Assessment & Plan Note (Signed)
Associated with CNVM noted in wet AMD, active CNVM above

## 2020-08-09 NOTE — Progress Notes (Signed)
Carelink Summary Report / Loop Recorder 

## 2020-08-17 ENCOUNTER — Encounter: Payer: Self-pay | Admitting: Internal Medicine

## 2020-08-17 NOTE — Progress Notes (Signed)
[facility]   Patient Information: Name:Andrew Cobb, Andrew Cobb   DOB: 10/10/1950  MRN: [id number]  [name], RN  P Cv Div Heartcare Device Planned Procedure: Left reverse shoulder arthroplasty  Surgeon: Dr. Justice Britain  Date of Procedure: 08-30-20  Cautery will be used. n/a  Position during surgery: n/a   Please send documentation back to:  [facility] (Fax # [phone])   [name], RN  08/17/2020 3:50 PM       Device Information:   Clinic EP Physician:   Cristopher Peru, MD Device Type:  LINQ loop recorder Manufacturer and Phone #:  [employer]: [phone] Pacemaker Dependent?:  No Date of Last Device Check:  07/27/20        Normal Device Function?:  Yes     Electrophysiologist's Recommendations:    Procedure should not interfere with device function.  No device programming or magnet placement needed.  Per Device Clinic Standing Orders, [name]  08/17/2020 5:08 PM

## 2020-08-17 NOTE — Patient Instructions (Addendum)
DUE TO COVID-19 ONLY ONE VISITOR IS ALLOWED TO COME WITH YOU AND STAY IN THE WAITING ROOM ONLY DURING PRE OP AND PROCEDURE DAY OF SURGERY. THE 1 VISITOR  MAY VISIT WITH YOU AFTER SURGERY IN YOUR PRIVATE ROOM DURING VISITING HOURS ONLY!  YOU NEED TO HAVE A COVID 19 TEST ON__2-21-22_____ @__0900  am_____, THIS TEST MUST BE DONE BEFORE SURGERY,  COVID TESTING SITE [address] [place] [address], IT IS ON THE RIGHT GOING OUT [address] APPROXIMATELY  2 MINUTES PAST [employer] ON THE RIGHT. ONCE YOUR COVID TEST IS COMPLETED,  PLEASE BEGIN THE QUARANTINE INSTRUCTIONS AS OUTLINED IN YOUR HANDOUT.                DAQUON GREENLEAF  08/17/2020   Your procedure is scheduled on: 08-30-20   Report to [facility] Main  Entrance                                         B[place]     Bring mask and tubing only   Report to admitting at      0730  AM     Call this number if you have problems the morning of surgery [phone]    Remember: NO SOLID FOOD AFTER MIDNIGHT THE NIGHT PRIOR TO SURGERY. NOTHING BY MOUTH EXCEPT CLEAR LIQUIDS UNTIL    0700 am . PLEASE FINISH ENSURE DRINK PER SURGEON ORDER  WHICH NEEDS TO BE COMPLETED AT       0700 am then nothing by mouth.    CLEAR LIQUID DIET   Foods Allowed                                                                            Foods Excluded  Black Coffee and tea, regular and decaf                             liquids that you cannot  Plain Jell-O any favor except red or purple                                           see through such as: Fruit ices (not with fruit pulp)                                                        milk, soups, orange juice  Iced Popsicles                                                          All solid food Carbonated beverages, regular and diet  Cranberry, grape and apple juices Sports drinks like Gatorade Lightly seasoned clear broth or consume(fat free) Sugar,  honey syrup   _____________________________________________________________________    BRUSH YOUR TEETH MORNING OF SURGERY AND RINSE YOUR MOUTH OUT, NO CHEWING GUM CANDY OR MINTS.     Take these medicines the morning of surgery with A SIP OF WATER: keppra, zyrtec,acyclovir  HOLD JARDIANCE DAY BEFORE SURGERY DO NOT TAKE ANY DIABETIC MEDICATIONS DAY OF YOUR SURGERY                               You may not have any metal on your body including hair pins and              piercings  Do not wear jewelry,  lotions, powders or perfumes, deodorant                   Men may shave face and neck.   Do not bring valuables to the hospital. [place].  Contacts, dentures or bridgework may not be worn into surgery.      Patients discharged the day of surgery will not be allowed to drive home. IF YOU ARE HAVING SURGERY AND GOING HOME THE SAME DAY, YOU MUST HAVE AN ADULT TO DRIVE YOU HOME AND BE WITH YOU FOR 24 HOURS. YOU MAY GO HOME BY TAXI OR UBER OR ORTHERWISE, BUT AN ADULT MUST ACCOMPANY YOU HOME AND STAY WITH YOU FOR 24 HOURS.  Name and phone number of your driver:  Special Instructions: N/A              Please read over the following fact sheets you were given: _____________________________________________________________________             [facility] - Preparing for Surgery Before surgery, you can play an important role.  Because skin is not sterile, your skin needs to be as free of germs as possible.  You can reduce the number of germs on your skin by washing with CHG (chlorahexidine gluconate) soap before surgery.  CHG is an antiseptic cleaner which kills germs and bonds with the skin to continue killing germs even after washing. Please DO NOT use if you have an allergy to CHG or antibacterial soaps.  If your skin becomes reddened/irritated stop using the CHG and inform your nurse when you arrive at Short Stay. Do not shave (including  legs and underarms) for at least 48 hours prior to the first CHG shower.  You may shave your face/neck. Please follow these instructions carefully:  1.  Shower with CHG Soap the night before surgery and the  morning of Surgery.  2.  If you choose to wash your hair, wash your hair first as usual with your  normal  shampoo.  3.  After you shampoo, rinse your hair and body thoroughly to remove the  shampoo.                           4.  Use CHG as you would any other liquid soap.  You can apply chg directly  to the skin and wash                       Gently with a scrungie or clean washcloth.  5.  Apply the CHG Soap to your body ONLY FROM THE NECK DOWN.   Do not use on face/ open                           Wound or open sores. Avoid contact with eyes, ears mouth and genitals (private parts).                       Wash face,  Genitals (private parts) with your normal soap.             6.  Wash thoroughly, paying special attention to the area where your surgery  will be performed.  7.  Thoroughly rinse your body with warm water from the neck down.  8.  DO NOT shower/wash with your normal soap after using and rinsing off  the CHG Soap.                9.  Pat yourself dry with a clean towel.            10.  Wear clean pajamas.            11.  Place clean sheets on your bed the night of your first shower and do not  sleep with pets. Day of Surgery : Do not apply any lotions/deodorants the morning of surgery.  Please wear clean clothes to the hospital/surgery center.  FAILURE TO FOLLOW THESE INSTRUCTIONS MAY RESULT IN THE CANCELLATION OF YOUR SURGERY PATIENT SIGNATURE_________________________________  NURSE SIGNATURE__________________________________  ________________________________________________________________________   Adam Phenix  An incentive spirometer is a tool that can help keep your lungs clear and active. This tool measures how well you are filling your lungs with each breath.  Taking long deep breaths may help reverse or decrease the chance of developing breathing (pulmonary) problems (especially infection) following:  A long period of time when you are unable to move or be active. BEFORE THE PROCEDURE   If the spirometer includes an indicator to show your best effort, your nurse or respiratory therapist will set it to a desired goal.  If possible, sit up straight or lean slightly forward. Try not to slouch.  Hold the incentive spirometer in an upright position. INSTRUCTIONS FOR USE  1. Sit on the edge of your bed if possible, or sit up as far as you can in bed or on a chair. 2. Hold the incentive spirometer in an upright position. 3. Breathe out normally. 4. Place the mouthpiece in your mouth and seal your lips tightly around it. 5. Breathe in slowly and as deeply as possible, raising the piston or the ball toward the top of the column. 6. Hold your breath for 3-5 seconds or for as long as possible. Allow the piston or ball to fall to the bottom of the column. 7. Remove the mouthpiece from your mouth and breathe out normally. 8. Rest for a few seconds and repeat Steps 1 through 7 at least 10 times every 1-2 hours when you are awake. Take your time and take a few normal breaths between deep breaths. 9. The spirometer may include an indicator to show your best effort. Use the indicator as a goal to work toward during each repetition. 10. After each set of 10 deep breaths, practice coughing to be sure your lungs are clear. If you have an incision (the cut made at the time of surgery), support your incision when coughing by placing a pillow or  rolled up towels firmly against it. Once you are able to get out of bed, walk around indoors and cough well. You may stop using the incentive spirometer when instructed by your caregiver.  RISKS AND COMPLICATIONS  Take your time so you do not get dizzy or light-headed.  If you are in pain, you may need to take or ask for pain  medication before doing incentive spirometry. It is harder to take a deep breath if you are having pain. AFTER USE  Rest and breathe slowly and easily.  It can be helpful to keep track of a log of your progress. Your caregiver can provide you with a simple table to help with this. If you are using the spirometer at home, follow these instructions: [place] IF:   You are having difficultly using the spirometer.  You have trouble using the spirometer as often as instructed.  Your pain medication is not giving enough relief while using the spirometer.  You develop fever of 100.5 F (38.1 C) or higher. SEEK IMMEDIATE MEDICAL CARE IF:   You cough up bloody sputum that had not been present before.  You develop fever of 102 F (38.9 C) or greater.  You develop worsening pain at or near the incision site. MAKE SURE YOU:   Understand these instructions.  Will watch your condition.  Will get help right away if you are not doing well or get worse. Document Released: 11/03/2006 Document Revised: 09/15/2011 Document Reviewed: 01/04/2007 [facility] Patient Information 2014 [place], [place].   ________________________________________________________________________

## 2020-08-17 NOTE — Progress Notes (Addendum)
PCP - Orion Crook , NP clearance 03-25-20 on chart Cardiologist - no  PPM/ICD - Device Orders -  Rep Notified -   Chest x-ray -02-17-20  EKG -09-08-19  Stress Test -  ECHO -  Cardiac Cath -  hgba1c 07-11-20   6.3  Sleep Study -  CPAP -   Fasting Blood Sugar - 100-120 Checks Blood Sugar __1___ times a day  Blood Thinner Instructions: Aspirin Instructions:81 MG  ERAS Protcol - PRE-SURGERY    COVID TEST- 2-21 ACTIVITY--Able to walk a flight of stairs without sob  Anesthesia review: CAD stents x 2 1996, osa, DM2, HTN, cryptogenic stroke, loop recorder  Patient denies shortness of breath, fever, cough and chest pain at PAT appointment   none   All instructions explained to the patient, with a verbal understanding of the material. Patient agrees to go over the instructions while at home for a better understanding. Patient also instructed to self quarantine after being tested for COVID-19. The opportunity to ask questions was provided.

## 2020-08-22 ENCOUNTER — Encounter (HOSPITAL_COMMUNITY): Payer: Self-pay

## 2020-08-22 ENCOUNTER — Encounter (HOSPITAL_COMMUNITY)
Admission: RE | Admit: 2020-08-22 | Discharge: 2020-08-22 | Disposition: A | Discharge status: Home / Self Care | Payer: PPO | Source: Ambulatory Visit | Attending: Orthopedic Surgery | Admitting: Orthopedic Surgery

## 2020-08-22 ENCOUNTER — Other Ambulatory Visit: Payer: Self-pay

## 2020-08-22 DIAGNOSIS — Z01812 Encounter for preprocedural laboratory examination: Secondary | ICD-10-CM | POA: Insufficient documentation

## 2020-08-22 DIAGNOSIS — I2510 Atherosclerotic heart disease of native coronary artery without angina pectoris: Secondary | ICD-10-CM | POA: Diagnosis not present

## 2020-08-22 DIAGNOSIS — Z8673 Personal history of transient ischemic attack (TIA), and cerebral infarction without residual deficits: Secondary | ICD-10-CM | POA: Diagnosis not present

## 2020-08-22 DIAGNOSIS — R569 Unspecified convulsions: Secondary | ICD-10-CM | POA: Diagnosis not present

## 2020-08-22 DIAGNOSIS — Z87891 Personal history of nicotine dependence: Secondary | ICD-10-CM | POA: Insufficient documentation

## 2020-08-22 DIAGNOSIS — Z79899 Other long term (current) drug therapy: Secondary | ICD-10-CM | POA: Insufficient documentation

## 2020-08-22 DIAGNOSIS — I10 Essential (primary) hypertension: Secondary | ICD-10-CM | POA: Diagnosis not present

## 2020-08-22 DIAGNOSIS — Z7982 Long term (current) use of aspirin: Secondary | ICD-10-CM | POA: Diagnosis not present

## 2020-08-22 DIAGNOSIS — M75102 Unspecified rotator cuff tear or rupture of left shoulder, not specified as traumatic: Secondary | ICD-10-CM | POA: Insufficient documentation

## 2020-08-22 DIAGNOSIS — Z7984 Long term (current) use of oral hypoglycemic drugs: Secondary | ICD-10-CM | POA: Insufficient documentation

## 2020-08-22 LAB — BASIC METABOLIC PANEL
Anion gap: 9 (ref 5–15)
BUN: 28 mg/dL — ABNORMAL HIGH (ref 8–23)
CO2: 26 mmol/L (ref 22–32)
Calcium: 9.2 mg/dL (ref 8.9–10.3)
Chloride: 104 mmol/L (ref 98–111)
Creatinine, Ser: 1.03 mg/dL (ref 0.61–1.24)
GFR, Estimated: >60 mL/min (ref >60)
Glucose, Bld: 144 mg/dL — ABNORMAL HIGH (ref 70–99)
Potassium: 4.1 mmol/L (ref 3.5–5.1)
Sodium: 139 mmol/L (ref 135–145)

## 2020-08-22 LAB — SURGICAL PCR SCREEN
MRSA, PCR: POSITIVE — AB
Staphylococcus aureus: POSITIVE — AB

## 2020-08-22 LAB — CBC
HCT: 44.4 % (ref 39.0–52.0)
Hemoglobin: 14 g/dL (ref 13.0–17.0)
MCH: 31.9 pg (ref 26.0–34.0)
MCHC: 31.5 g/dL (ref 30.0–36.0)
MCV: 101.1 fL — ABNORMAL HIGH (ref 80.0–100.0)
Platelets: 135 10*3/uL — ABNORMAL LOW (ref 150–400)
RBC: 4.39 MIL/uL (ref 4.22–5.81)
RDW: 12.9 % (ref 11.5–15.5)
WBC: 4.6 10*3/uL (ref 4.0–10.5)
nRBC: 0 % (ref 0.0–0.2)

## 2020-08-22 LAB — GLUCOSE, CAPILLARY: Glucose-Capillary: 116 mg/dL — ABNORMAL HIGH (ref 70–99)

## 2020-08-23 ENCOUNTER — Telehealth: Payer: Self-pay | Admitting: *Deleted

## 2020-08-23 ENCOUNTER — Other Ambulatory Visit: Payer: Self-pay | Admitting: Nurse Practitioner

## 2020-08-23 MED ORDER — EMPAGLIFLOZIN 10 MG PO TABS: 10.0000 mg | ORAL_TABLET | Freq: Every day | ORAL | 3 refills | Status: DC

## 2020-08-23 NOTE — Telephone Encounter (Signed)
   [facility] Pre-operative Risk Assessment    HEARTCARE STAFF: - Please ensure there is not already an duplicate clearance open for this procedure. - Under Visit Info/Reason for Call, type in Other and utilize the format Clearance MM/DD/YY or Clearance TBD. Do not use dashes or single digits. - If request is for dental extraction, please clarify the # of teeth to be extracted.  Request for surgical clearance:  1. What type of surgery is being performed? LEFT REVERSE SHOULDER ARTHROPLASTY   2. When is this surgery scheduled? 08/30/20   3. What type of clearance is required (medical clearance vs. Pharmacy clearance to hold med vs. Both)? MEDICAL  4. Are there any medications that need to be held prior to surgery and how long? PLAVIX    5. Practice name and name of physician performing surgery? [facility]; DR. Lennette Bihari SUPPLE   6. What is the office phone number? [phone]   7.   What is the office fax number? [phone]  8.   Anesthesia type (None, local, MAC, general) ? GENERAL   Julaine Hua 08/23/2020, 3:18 PM  _________________________________________________________________   (provider comments below)

## 2020-08-23 NOTE — Telephone Encounter (Signed)
Message sent to EP scheduler Darrin Nipper who is covering for [place]. Asked Anderson Malta to please set up pre op appt with EP APP. Surgery is 08/30/20.

## 2020-08-23 NOTE — Telephone Encounter (Signed)
Medication Refill - Medication: empagliflozin (JARDIANCE) 10 MG TABS tablet     Preferred Pharmacy (with phone number or street name):  [facility]) - [address], [place] Phone:  [phone]  Fax:  [phone]       Agent: Please be advised that RX refills may take up to 3 business days. We ask that you follow-up with your pharmacy.

## 2020-08-23 NOTE — Telephone Encounter (Signed)
Patient is scheduled for surgical clearance with Tommye Standard PA-C at 8:45 am. Surgeon has been made aware that is the first date we can get the patient in for clearance. Surgeon will reach out to patient to find another date

## 2020-08-23 NOTE — Telephone Encounter (Signed)
Pt has been scheduled for 08/30/20 with Tommye Standard, [facility] for pre op clearance. Per EP scheduler the surgeon has been made aware procedure will need to be postponed until cleared by cardiology. Will send notes to PA for upcoming appt. Will send FYI to requesting office pt has appt 08/30/20.

## 2020-08-23 NOTE — Telephone Encounter (Signed)
   Primary Cardiologist: No primary care provider on file.  Chart reviewed as part of pre-operative protocol coverage. Because of Andrew Cobb's past medical history and time since last visit, he will require a follow-up visit in order to better assess preoperative cardiovascular risk.  Pre-op covering staff: - Please schedule appointment and call patient to inform them. If patient already had an upcoming appointment within acceptable timeframe, please add "pre-op clearance" to the appointment notes so provider is aware. - Please contact requesting surgeon's office via preferred method (i.e, phone, fax) to inform them of need for appointment prior to surgery.  If applicable, this message will also be routed to pharmacy pool and/or primary cardiologist for input on holding anticoagulant/antiplatelet agent as requested below so that this information is available to the clearing provider at time of patient's appointment.   Kathyrn Drown, NP  08/23/2020, 3:24 PM

## 2020-08-23 NOTE — Progress Notes (Addendum)
Anesthesia Chart Review   Case: [id number] Date/Time: 08/30/20 0945   Procedure: REVERSE SHOULDER ARTHROPLASTY (Left Shoulder) - 174min   Anesthesia type: General   Pre-op diagnosis: left shoulder rotator cuff tear arthropathy   Location: [facility] ROOM 06 / [facility]   Surgeons: Justice Britain, MD      DISCUSSION:70 y.o. 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 Justice Britain.   Pt last seen by neurology 05/09/2020. Per OV note pt continues on Keppra without recurrence of seizures. Stable at this visit with 1 year follow up recommended.    Pt previously scheduled for 07/13/19.  Cardiac clearance requested at that time.   Recent admission 1/4-07/13/20 due to hypovolemia due to dehydration and AKI.  He was advised to follow up with PCP 07/30/20 after admission.  No notes from PCP in Epic.  Clearance requested from PCP in light of recent admission. Voicemail left with Dr. Susie Cassette office.   Addendum 08/29/20:  Pt is scheduled to see cardiology tomorrow morning for preoperative evaluation.  Surgeon is aware that if patient is not cleared at this visit surgery will be cancelled.  This has also been explained to the patient, he does not want to cancel or reschedule his shoulder surgery and understands cancellation risk.   Addendum 08/30/2020:  Seen by cardiology today.  Per OV note, "Low risk surgery     RCRI score is 6.6%, discussed with pt given his hx is at some increased surgical/anesthesia risk     METS 9.89     No need for pre-operative cardiac testing, is an acceptable risk for shoulder surgery planned     Ne peri-operative device management required" VS: BP 123/64    Pulse (!) 56    Temp 36.5 C (Oral)    Resp 16    Ht 5\' 10"  (1.778 m)    Wt 91.2 kg    SpO2 100%    BMI 28.84 kg/m    PROVIDERS: [name], NP is PCP    LABS: Labs reviewed: Acceptable for surgery. (all labs ordered are listed, but only abnormal results are displayed)  Labs Reviewed  SURGICAL PCR SCREEN - Abnormal; Notable for the following components:      Result Value   MRSA, PCR POSITIVE (*)    Staphylococcus aureus POSITIVE (*)    All other components within normal limits  CBC - Abnormal; Notable for the following components:   MCV 101.1 (*)    Platelets 135 (*)    All other components within normal limits  BASIC METABOLIC PANEL - Abnormal; Notable for the following components:   Glucose, Bld 144 (*)    BUN 28 (*)    All other components within normal limits  GLUCOSE, CAPILLARY - Abnormal; Notable for the following components:   Glucose-Capillary 116 (*)    All other components within normal limits     IMAGES:   EKG: 09/18/2019 Rate 92 bpm  NSR Inferior infarct, age undetermined Anterolateral infarct, age undetermined   CV: Echo 09/20/2018 IMPRESSIONS    1. The right ventricle has normal systolc function. The cavity was  normal. There is no increase in right ventricular wall thickness.  2. No LAA thormbus   No source of embolus.  3. Mild thickening of the mitral valve leaflet.  4. The aortic valve  is tricuspid Mild thickening of the aortic valve  Aortic valve regurgitation is mild by color flow Doppler.  5. The aortic root is normal in size and structure.  6. The interatrial septum appears to be lipomatous.  7. The left ventricle has normal systolic function, with an ejection  fraction of 60-65%.  Past Medical History:  Diagnosis Date   Arthritis    Asthma    has not needed since 6/21   Cancer [facility])    prostate   Chronic venous insufficiency    varicose vein lower extremity with inflammation   Coronary artery disease 1996   two stents placed    Diabetes mellitus without complication ([place])    type 2 on metformin   GERD (gastroesophageal  reflux disease)    no issues since gastric bypass surgery as stated per pt   Hyperlipidemia    Hypertension    Hypogonadism in male    MRSA (methicillin resistant Staphylococcus aureus) infection    07/30/2008 thru 08/07/2008  abdominal abcess   Sleep apnea    on BIPAP   Stented coronary artery    Stroke ([place]) 09/07/2019   no defecits   Thrombocythemia     Past Surgical History:  Procedure Laterality Date   ANGIOPLASTY     ANGIOPLASTY     with stent 04/07/1995   ANTERIOR CERVICAL DECOMPRESSION/DISCECTOMY FUSION 4 LEVELS N/A 08/17/2017   Procedure: Anterior discectomy with fusion and plate fixation Cervical Three-Four, Four-Five, Five-Six, and Six-Seven Fusion;  Surgeon: Ditty, Kevan Ny, MD;  Location: [place];  Service: Neurosurgery;  Laterality: N/A;  Anterior discectomy with fusion and plate fixation Cervical Three-Four, Four-Five, Five-Six, and Six-Seven Fusion    APPENDECTOMY     1966   BUBBLE STUDY  09/20/2018   Procedure: BUBBLE STUDY;  Surgeon: Josue Hector, MD;  Location: [facility] ENDOSCOPY;  Service: Cardiovascular;;   [place] TEST     07/31/2011   CARPAL TUNNEL RELEASE Left    CHOLECYSTECTOMY     2006   colonscopy      08/25/2012   EYE SURGERY Bilateral    cataract   FUNCTIONAL ENDOSCOPIC SINUS SURGERY     11/10/2013   GASTRIC BYPASS     10/05/2012   HERNIA REPAIR     left inguinal 1981   INCISION AND DRAINAGE ABSCESS Right 02/26/2017   Procedure: INCISION AND DRAINAGE ABSCESS;  Surgeon: Nickie Retort, MD;  Location: [facility] ORS;  Service: Urology;  Laterality: Right;   JOINT REPLACEMENT     bilateral   left ankle surgery      05/03/2003    left carpel tunnel      09/18/1993   left knee meniscal tear      01/25/2010   left knee meniscal tear repair      05/04/1996   left rotator cuff repair      05/03/2003    LOOP RECORDER INSERTION N/A 09/20/2018   Procedure: LOOP RECORDER INSERTION;   Surgeon: Evans Lance, MD;  Location: [place] CV LAB;  Service: Cardiovascular;  Laterality: N/A;   REPLACEMENT TOTAL KNEE BILATERAL  07/13/2015   right ankle surgery      fracture has 2 screws 07/07/1997   right carpel tunnel      05/16/1992   right shoulder replacement      01/27/2006   SCROTAL EXPLORATION Right 02/26/2017   Procedure: SCROTUM EXPLORATION;  Surgeon: Nickie Retort, MD;  Location: [facility] ORS;  Service:  Urology;  Laterality: Right;   TEE WITHOUT CARDIOVERSION N/A 09/20/2018   Procedure: TRANSESOPHAGEAL ECHOCARDIOGRAM (TEE);  Surgeon: Josue Hector, MD;  Location: [facility] ENDOSCOPY;  Service: Cardiovascular;  Laterality: N/A;   TOTAL KNEE ARTHROPLASTY Left 07/13/2015   Procedure: LEFT TOTAL KNEE ARTHROPLASTY;  Surgeon: Gaynelle Arabian, MD;  Location: [facility];  Service: Orthopedics;  Laterality: Left;    MEDICATIONS:  acyclovir (ZOVIRAX) 400 MG tablet   amitriptyline (ELAVIL) 25 MG tablet   aspirin EC 81 MG EC tablet   atorvastatin (LIPITOR) 40 MG tablet   betamethasone dipropionate (DIPROLENE) 0.05 % ointment   cetirizine (ZYRTEC) 10 MG tablet   Cholecalciferol (VITAMIN D) 2000 units CAPS   Cyanocobalamin (B-12) 5000 MCG CAPS   empagliflozin (JARDIANCE) 10 MG TABS tablet   Lancets (ONETOUCH ULTRASOFT) lancets   levETIRAcetam (KEPPRA) 500 MG tablet   metFORMIN (GLUCOPHAGE) 1000 MG tablet   metFORMIN (GLUCOPHAGE) 500 MG tablet   Multiple Vitamins-Minerals (MULTIVITAMIN PO)   ONETOUCH ULTRA test strip   tamsulosin (FLOMAX) 0.4 MG CAPS capsule   telmisartan (MICARDIS) 80 MG tablet   vitamin B-12 (CYANOCOBALAMIN) 1000 MCG tablet   No current facility-administered medications for this encounter.    Konrad Felix, PA-C [facility] Pre-Surgical Testing [phone]

## 2020-08-24 NOTE — Telephone Encounter (Signed)
Appointment scheduled. Will remove from preop pool.   Loel Dubonnet, NP

## 2020-08-26 NOTE — Progress Notes (Signed)
Cardiology Office Note Date:  08/26/2020  Patient ID:  Andrew Cobb, Andrew Cobb [birth year], MRN [id number] PCP:  [name], NP  Cardiologist: Dr. Josefa Half, last 2016 Electrophysiologist: Dr. Lovena Le     Chief Complaint:  pre-op shoulder surgery  History of Present Illness: Andrew Cobb is a 70 y.o. male with history of CAD (stent to prox RCA 1996), DM, OSA w/CPAP, strokes, HTN   He was last seen by EP service during his hospital stay Aug 2020 for recurrent stroke, EP was called for asymptomatic bradycardia, noted to have tow 2 second pauses.ILR was interrogated with no events, noting pause detection turned off 02/01/2019 due to undersensing (R waves 0.12 - 0.16 mV). Heart rate histograms show occasional rates <40, but this again may be affected by his undersensing. He had not had any AF. Planned to wear a 30 day monitor to try and identify and missed events. 1) The basic rhythm is normal sinus with an average HR of 62 bpm 2) There are occasional PAC's and PVC's occurring at an overall burden of < 1% 3) There is no atrial fibrillation or flutter 4) No bradycardic events or pathologic pauses 5) No sustained arrhythmia   TODAY Outside of his shoulder pain he is doing GREAT! Desite his shoulder, knees, he exercises regularly, goes to the [facility], rides bike, does treadmill and participated in water aerobics.  He just finished trimming his trees. He denies any kind of CP, palpitations or cardiac awareness. No dizzy spells, near syncope or syncope. No SOB or DOE.  His PMD monitors and manages his lipids.    RCRI score is 2, = 6.6% risk DUKE is 9.89METS  Device information MDT ILR implanted 09/20/2018, cryptogenic stroke   Past Medical History:  Diagnosis Date  . Arthritis   . Asthma    has not needed since 6/21  . Cancer [facility])    prostate  . Chronic venous insufficiency    varicose vein lower extremity with inflammation  . Coronary artery disease 1996   two stents  placed   . Diabetes mellitus without complication ([place])    type 2 on metformin  . GERD (gastroesophageal reflux disease)    no issues since gastric bypass surgery as stated per pt  . Hyperlipidemia   . Hypertension   . Hypogonadism in male   . MRSA (methicillin resistant Staphylococcus aureus) infection    07/30/2008 thru 08/07/2008  abdominal abcess  . Sleep apnea    on BIPAP  . Stented coronary artery   . Stroke ([place]) 09/07/2019   no defecits  . Thrombocythemia     Past Surgical History:  Procedure Laterality Date  . ANGIOPLASTY    . ANGIOPLASTY     with stent 04/07/1995  . ANTERIOR CERVICAL DECOMPRESSION/DISCECTOMY FUSION 4 LEVELS N/A 08/17/2017   Procedure: Anterior discectomy with fusion and plate fixation Cervical Three-Four, Four-Five, Five-Six, and Six-Seven Fusion;  Surgeon: Ditty, Kevan Ny, MD;  Location: [place];  Service: Neurosurgery;  Laterality: N/A;  Anterior discectomy with fusion and plate fixation Cervical Three-Four, Four-Five, Five-Six, and Six-Seven Fusion   . APPENDECTOMY     1966  . BUBBLE STUDY  09/20/2018   Procedure: BUBBLE STUDY;  Surgeon: Josue Hector, MD;  Location: [place];  Service: Cardiovascular;;  . [place]  . CARDIOVASCULAR STRESS TEST     07/31/2011  . CARPAL TUNNEL RELEASE Left   . CHOLECYSTECTOMY     2006  . colonscopy  08/25/2012  . EYE SURGERY Bilateral    cataract  . FUNCTIONAL ENDOSCOPIC SINUS SURGERY     11/10/2013  . GASTRIC BYPASS     10/05/2012  . HERNIA REPAIR     left inguinal 1981  . INCISION AND DRAINAGE ABSCESS Right 02/26/2017   Procedure: INCISION AND DRAINAGE ABSCESS;  Surgeon: Nickie Retort, MD;  Location: [facility];  Service: Urology;  Laterality: Right;  . JOINT REPLACEMENT     bilateral  . left ankle surgery      05/03/2003   . left carpel tunnel      09/18/1993  . left knee meniscal tear      01/25/2010  . left knee meniscal tear repair      05/04/1996  . left rotator  cuff repair      05/03/2003   . LOOP RECORDER INSERTION N/A 09/20/2018   Procedure: LOOP RECORDER INSERTION;  Surgeon: Evans Lance, MD;  Location: [place] CV [facility];  Service: Cardiovascular;  Laterality: N/A;  . REPLACEMENT TOTAL KNEE BILATERAL  07/13/2015  . right ankle surgery      fracture has 2 screws 07/07/1997  . right carpel tunnel      05/16/1992  . right shoulder replacement      01/27/2006  . SCROTAL EXPLORATION Right 02/26/2017   Procedure: SCROTUM EXPLORATION;  Surgeon: Nickie Retort, MD;  Location: [facility];  Service: Urology;  Laterality: Right;  . TEE WITHOUT CARDIOVERSION N/A 09/20/2018   Procedure: TRANSESOPHAGEAL ECHOCARDIOGRAM (TEE);  Surgeon: Josue Hector, MD;  Location: [facility] END[facility];  Service: Cardiovascular;  Laterality: N/A;  . TOTAL KNEE ARTHROPLASTY Left 07/13/2015   Procedure: LEFT TOTAL KNEE ARTHROPLASTY;  Surgeon: Gaynelle Arabian, MD;  Location: [facility];  Service: Orthopedics;  Laterality: Left;    Current Outpatient Medications  Medication Sig Dispense Refill  . acyclovir (ZOVIRAX) 400 MG tablet Take 1 tablet (400 mg total) by mouth 2 (two) times daily. 180 tablet 4  . amitriptyline (ELAVIL) 25 MG tablet Take 1 tablet (25 mg total) by mouth at bedtime. 90 tablet 4  . aspirin EC 81 MG EC tablet Take 1 tablet (81 mg total) by mouth daily. 30 tablet 0  . atorvastatin (LIPITOR) 40 MG tablet Take 1 tablet (40 mg total) by mouth at bedtime. 90 tablet 4  . betamethasone dipropionate (DIPROLENE) 0.05 % ointment Apply 1 application topically 2 (two) times daily as needed for rash.    . cetirizine (ZYRTEC) 10 MG tablet Take 10 mg by mouth daily.    . Cholecalciferol (VITAMIN D) 2000 units CAPS Take 2,000 Units by mouth daily.    . Cyanocobalamin (B-12) 5000 MCG CAPS Take 5,000 mcg by mouth daily.    . empagliflozin (JARDIANCE) 10 MG TABS tablet Take 1 tablet (10 mg total) by mouth daily before breakfast. 90 tablet 3  . Lancets (ONETOUCH ULTRASOFT) lancets 1 each  by Other route 2 (two) times daily. Dx E11.9 100 each 11  . levETIRAcetam (KEPPRA) 500 MG tablet Take 1 tablet (500 mg total) by mouth 2 (two) times daily. 180 tablet 3  . metFORMIN (GLUCOPHAGE) 1000 MG tablet Take 1 tablet (1,000 mg total) by mouth 2 (two) times daily with a meal. 180 tablet 4  . metFORMIN (GLUCOPHAGE) 500 MG tablet Take 500 mg by mouth 2 (two) times daily with a meal.    . Multiple Vitamins-Minerals (MULTIVITAMIN PO) Take 1 tablet by mouth daily.     [name] ULTRA test strip USE TWICE DAILY  100 each 11  . tamsulosin (FLOMAX) 0.4 MG CAPS capsule Take 1 capsule (0.4 mg total) by mouth at bedtime. 90 capsule 4  . telmisartan (MICARDIS) 80 MG tablet Take 0.5 tablets (40 mg total) by mouth daily. 45 tablet 4  . vitamin B-12 (CYANOCOBALAMIN) 1000 MCG tablet Take 1 tablet (1,000 mcg total) by mouth daily. (Patient not taking: No sig reported) 90 tablet 4   No current facility-administered medications for this visit.    Allergies:   Succinylsulphathiazole, Sulfamethoxazole-trimethoprim, and Tetracyclines & related   Social History:  The patient  reports that he quit smoking about 36 years ago. His smoking use included cigarettes. He has a 10.00 pack-year smoking history. He has never used smokeless tobacco. He reports that he does not drink alcohol and does not use drugs.   Family History:  The patient's family history includes Cancer in his brother, brother, mother, and sister; Diabetes in his father and mother; Heart attack in his father and mother; Heart disease in his father and mother; Hyperlipidemia in his father and mother; Hypertension in his father and mother; Pancreatic cancer in his father; Stroke in his father and mother.  ROS:  Please see the history of present illness.    All other systems are reviewed and otherwise negative.   PHYSICAL EXAM:  VS:  There were no vitals taken for this visit. BMI: There is no height or weight on file to calculate BMI. Well nourished,  well developed, in no acute distress HEENT: normocephalic, atraumatic Neck: no JVD, carotid bruits or masses Cardiac:  RRR; no significant murmurs, no rubs, or gallops Lungs:  CTA b/l, no wheezing, rhonchi or rales Abd: soft, nontender MS: no deformity or atrophy Ext: no edema Skin: warm and dry, no rash Neuro:  No gross deficits appreciated Psych: euthymic mood, full affect  ILR site is stable, no tethering or discomfort   EKG:  Done today and reviewed by myself shows  SB 55bpm, no changes  Device interrogation done today and reviewed by myself:  battery status is good Sinus today R waves 0.56mV One episode labled AF 05/16/2019, looks false with artifact and drop out  09/20/2018: TEE IMPRESSIONS 1. The right ventricle has normal systolc function. The cavity was  normal. There is no increase in right ventricular wall thickness.  2. No LAA thormbus   No source of embolus.  3. Mild thickening of the mitral valve leaflet.  4. The aortic valve is tricuspid Mild thickening of the aortic valve  Aortic valve regurgitation is mild by color flow Doppler.  5. The aortic root is normal in size and structure.  6. The interatrial septum appears to be lipomatous.  7. The left ventricle has normal systolic function, with an ejection  fraction of 60-65%.   04/11/2019: 30 day monitor 1) The basic rhythm is normal sinus with an average HR of 62 bpm 2) There are occasional PAC's and PVC's occurring at an overall burden of < 1% 3) There is no atrial fibrillation or flutter 4) No bradycardic events or pathologic pauses 5) No sustained arrhythmia   Recent Labs: 09/13/2019: B Natriuretic Peptide 23.5 03/20/2020: TSH 1.920 07/10/2020: ALT 37 07/13/2020: Magnesium 1.8 08/22/2020: BUN 28; Creatinine, Ser 1.03; Hemoglobin 14.0; Platelets 135; Potassium 4.1; Sodium 139  09/08/2019: Total CHOL/HDL Ratio 2.6; VLDL 15 03/20/2020: Cholesterol, Total 132; HDL 63; LDL Chol Calc (NIH) 53; Triglycerides  84   Estimated Creatinine Clearance: 76.9 mL/min (by C-G formula based on SCr of 1.03 mg/dL).  Wt Readings from Last 3 Encounters:  08/22/20 201 lb (91.2 kg)  07/20/20 195 lb (88.5 kg)  07/13/20 203 lb 7.8 oz (92.3 kg)     Other studies reviewed: Additional studies/records reviewed today include: summarized above  ASSESSMENT AND PLAN:  1. CAD     No symptoms     On ASA, statin  2. HTN     Looks good  3. Cryptogenic stroke     ILR     No programming changes     No AF  4. HLD     Monitored and managed by his PMD  5. Pre-op     Low risk surgery     RCRI score is 6.6%, discussed with pt given his hx is at some increased surgical/anesthesia risk     METS 9.89     No need for pre-operative cardiac testing, is an acceptable risk for shoulder surgery planned     Ne peri-operative device management required  Disposition: F/u with remotes as usual, in clinic in 1 year, sooner if needed  Current medicines are reviewed at length with the patient today.  The patient did not have any concerns regarding medicines.  Venetia Night, PA-C 08/26/2020 4:53 PM     [address] [place] [address] [phone] (office)  [phone] (fax)

## 2020-08-27 ENCOUNTER — Other Ambulatory Visit (HOSPITAL_COMMUNITY)
Admission: RE | Admit: 2020-08-27 | Discharge: 2020-08-27 | Disposition: A | Discharge status: Home / Self Care | Payer: PPO | Source: Ambulatory Visit | Attending: Orthopedic Surgery | Admitting: Orthopedic Surgery

## 2020-08-27 ENCOUNTER — Ambulatory Visit (INDEPENDENT_AMBULATORY_CARE_PROVIDER_SITE_OTHER): Payer: PPO | Admitting: Nurse Practitioner

## 2020-08-27 ENCOUNTER — Encounter: Payer: Self-pay | Admitting: Nurse Practitioner

## 2020-08-27 ENCOUNTER — Other Ambulatory Visit: Payer: Self-pay

## 2020-08-27 VITALS — BP 125/79 | HR 64 | Temp 97.4°F | Wt 202.2 lb

## 2020-08-27 DIAGNOSIS — Z01818 Encounter for other preprocedural examination: Secondary | ICD-10-CM | POA: Diagnosis not present

## 2020-08-27 DIAGNOSIS — Z20822 Contact with and (suspected) exposure to covid-19: Secondary | ICD-10-CM | POA: Diagnosis not present

## 2020-08-27 DIAGNOSIS — Z01812 Encounter for preprocedural laboratory examination: Secondary | ICD-10-CM | POA: Insufficient documentation

## 2020-08-27 LAB — BAYER DCA HB A1C WAIVED: HB A1C (BAYER DCA - WAIVED): 6.7 % (ref <7.0)

## 2020-08-27 LAB — SARS CORONAVIRUS 2 (TAT 6-24 HRS): SARS Coronavirus 2: NEGATIVE

## 2020-08-27 NOTE — Assessment & Plan Note (Addendum)
Scheduled for shoulder surgery 08/30/20, no form with him today and sees cardiology Thursday -- will defer EKG to them.  Obtain A1c, CBC, CMP today.  Recommend he continue to hold ASA until 3-5 days after surgery and continue remainder of medications.  Return as scheduled in April.

## 2020-08-27 NOTE — Progress Notes (Signed)
BP 125/79   Pulse 64   Temp (!) 97.4 F (36.3 C) (Oral)   Wt 202 lb 3.2 oz (91.7 kg)   SpO2 96%   BMI 29.01 kg/m    Subjective:    Patient ID: Andrew Cobb, male    DOB: [birth year], 70 y.o.   MRN: [id number]  HPI: Andrew Cobb is a 70 y.o. male  Chief Complaint  Patient presents with  . Surgical Clearance    Patient states he does not know what this appointment is for. States he has an appointment with his cardiologist Thursday prior to surgery.   SURGICAL CLEARANCE: Has appointment with cardiology Thursday for surgical clearance.  Having left complete shoulder surgery on 08/30/20 -- does not have form with him today, but reports cardiology will perform heart work-up.  He has stopped ASA at this time due to surgery.  Denies any CP, SOB, palpitations.  Reports ortho did not give him form to sign, just told him to see PCP and cardiology.  Relevant past medical, surgical, family and social history reviewed and updated as indicated. Interim medical history since our last visit reviewed. Allergies and medications reviewed and updated.  Review of Systems  Constitutional: Negative for activity change, diaphoresis, fatigue and fever.  Respiratory: Negative for cough, chest tightness, shortness of breath and wheezing.   Cardiovascular: Negative for chest pain, palpitations and leg swelling.  Gastrointestinal: Negative.   Endocrine: Negative for polydipsia, polyphagia and polyuria.  Neurological: Negative.   Psychiatric/Behavioral: Negative.     Per HPI unless specifically indicated above     Objective:    BP 125/79   Pulse 64   Temp (!) 97.4 F (36.3 C) (Oral)   Wt 202 lb 3.2 oz (91.7 kg)   SpO2 96%   BMI 29.01 kg/m   Wt Readings from Last 3 Encounters:  08/27/20 202 lb 3.2 oz (91.7 kg)  08/22/20 201 lb (91.2 kg)  07/20/20 195 lb (88.5 kg)    Physical Exam Vitals and nursing note reviewed.  Constitutional:      General: He is awake. He is not in acute  distress.    Appearance: He is well-developed and well-groomed. He is not ill-appearing.  HENT:     Head: Normocephalic and atraumatic.     Right Ear: Hearing normal. No drainage.     Left Ear: Hearing normal. No drainage.     Nose:     Comments: Mask in place. Eyes:     General: Lids are normal.        Right eye: No discharge.        Left eye: No discharge.     Conjunctiva/sclera: Conjunctivae normal.     Pupils: Pupils are equal, round, and reactive to light.  Neck:     Thyroid: No thyromegaly.     Vascular: No carotid bruit or JVD.     Trachea: Trachea normal.  Cardiovascular:     Rate and Rhythm: Normal rate and regular rhythm.     Heart sounds: Normal heart sounds, S1 normal and S2 normal. No murmur heard. No gallop.   Pulmonary:     Effort: Pulmonary effort is normal. No accessory muscle usage or respiratory distress.     Breath sounds: Normal breath sounds.  Abdominal:     General: Bowel sounds are normal.     Palpations: Abdomen is soft. There is no hepatomegaly or splenomegaly.  Musculoskeletal:        General: Normal range of motion.  Cervical back: Normal range of motion and neck supple.     Right lower leg: No edema.     Left lower leg: No edema.  Skin:    General: Skin is warm and dry.     Capillary Refill: Capillary refill takes less than 2 seconds.  Neurological:     Mental Status: He is alert and oriented to person, place, and time.     Deep Tendon Reflexes: Reflexes are normal and symmetric.  Psychiatric:        Attention and Perception: Attention normal.        Mood and Affect: Mood normal.        Speech: Speech normal.        Behavior: Behavior normal. Behavior is cooperative.        Thought Content: Thought content normal.     Results for orders placed or performed during the hospital encounter of 08/22/20  Surgical pcr screen   Specimen: Nasal Mucosa; Nasal Swab  Result Value Ref Range   MRSA, PCR POSITIVE (A) NEGATIVE   Staphylococcus  aureus POSITIVE (A) NEGATIVE  CBC  Result Value Ref Range   WBC 4.6 4.0 - 10.5 K/uL   RBC 4.39 4.22 - 5.81 MIL/uL   Hemoglobin 14.0 13.0 - 17.0 g/dL   HCT 44.4 39.0 - 52.0 %   MCV 101.1 (H) 80.0 - 100.0 fL   MCH 31.9 26.0 - 34.0 pg   MCHC 31.5 30.0 - 36.0 g/dL   RDW 12.9 11.5 - 15.5 %   Platelets 135 (L) 150 - 400 K/uL   nRBC 0.0 0.0 - 0.2 %  Basic metabolic panel  Result Value Ref Range   Sodium 139 135 - 145 mmol/L   Potassium 4.1 3.5 - 5.1 mmol/L   Chloride 104 98 - 111 mmol/L   CO2 26 22 - 32 mmol/L   Glucose, Bld 144 (H) 70 - 99 mg/dL   BUN 28 (H) 8 - 23 mg/dL   Creatinine, Ser 1.03 0.61 - 1.24 mg/dL   Calcium 9.2 8.9 - 10.3 mg/dL   GFR, Estimated >60 >60 mL/min   Anion gap 9 5 - 15  Glucose, capillary  Result Value Ref Range   Glucose-Capillary 116 (H) 70 - 99 mg/dL      Assessment & Plan:   Problem List Items Addressed This Visit      Other   Preoperative clearance - Primary    Scheduled for shoulder surgery 08/30/20, no form with him today and sees cardiology Thursday -- will defer EKG to them.  Obtain A1c, CBC, CMP today.  Recommend he continue to hold ASA until 3-5 days after surgery and continue remainder of medications.  Return as scheduled in April.        Relevant Orders   EKG 12-Lead   CBC with Differential/Platelet   Comprehensive metabolic panel   Bayer DCA Hb A1c Waived       Follow up plan: Return for as scheduled in April -- please ensure he has visit scheduled.

## 2020-08-27 NOTE — Patient Instructions (Signed)
Diabetes Mellitus and Nutrition, Adult When you have diabetes, or diabetes mellitus, it is very important to have healthy eating habits because your blood sugar (glucose) levels are greatly affected by what you eat and drink. Eating healthy foods in the right amounts, at about the same times every day, can help you:  Control your blood glucose.  Lower your risk of heart disease.  Improve your blood pressure.  Reach or maintain a healthy weight. What can affect my meal plan? Every person with diabetes is different, and each person has different needs for a meal plan. Your health care provider may recommend that you work with a dietitian to make a meal plan that is best for you. Your meal plan may vary depending on factors such as:  The calories you need.  The medicines you take.  Your weight.  Your blood glucose, blood pressure, and cholesterol levels.  Your activity level.  Other health conditions you have, such as heart or kidney disease. How do carbohydrates affect me? Carbohydrates, also called carbs, affect your blood glucose level more than any other type of food. Eating carbs naturally raises the amount of glucose in your blood. Carb counting is a method for keeping track of how many carbs you eat. Counting carbs is important to keep your blood glucose at a healthy level, especially if you use insulin or take certain oral diabetes medicines. It is important to know how many carbs you can safely have in each meal. This is different for every person. Your dietitian can help you calculate how many carbs you should have at each meal and for each snack. How does alcohol affect me? Alcohol can cause a sudden decrease in blood glucose (hypoglycemia), especially if you use insulin or take certain oral diabetes medicines. Hypoglycemia can be a life-threatening condition. Symptoms of hypoglycemia, such as sleepiness, dizziness, and confusion, are similar to symptoms of having too much  alcohol.  Do not drink alcohol if: ? Your health care provider tells you not to drink. ? You are pregnant, may be pregnant, or are planning to become pregnant.  If you drink alcohol: ? Do not drink on an empty stomach. ? Limit how much you use to:  0-1 drink a day for women.  0-2 drinks a day for men. ? Be aware of how much alcohol is in your drink. In the [place] one drink equals one 12 oz bottle of beer (355 mL), one 5 oz glass of wine (148 mL), or one 1 oz glass of hard liquor (44 mL). ? Keep yourself hydrated with water, diet soda, or unsweetened iced tea.  Keep in mind that regular soda, juice, and other mixers may contain a lot of sugar and must be counted as carbs. What are tips for following this plan? Reading food labels  Start by checking the serving size on the "Nutrition Facts" label of packaged foods and drinks. The amount of calories, carbs, fats, and other nutrients listed on the label is based on one serving of the item. Many items contain more than one serving per package.  Check the total grams (g) of carbs in one serving. You can calculate the number of servings of carbs in one serving by dividing the total carbs by 15. For example, if a food has 30 g of total carbs per serving, it would be equal to 2 servings of carbs.  Check the number of grams (g) of saturated fats and trans fats in one serving. Choose foods that have   a low amount or none of these fats.  Check the number of milligrams (mg) of salt (sodium) in one serving. Most people should limit total sodium intake to less than 2,300 mg per day.  Always check the nutrition information of foods labeled as "low-fat" or "nonfat." These foods may be higher in added sugar or refined carbs and should be avoided.  Talk to your dietitian to identify your daily goals for nutrients listed on the label. Shopping  Avoid buying canned, pre-made, or processed foods. These foods tend to be high in fat, sodium, and added  sugar.  Shop around the outside edge of the grocery store. This is where you will most often find fresh fruits and vegetables, bulk grains, fresh meats, and fresh dairy. Cooking  Use low-heat cooking methods, such as baking, instead of high-heat cooking methods like deep frying.  Cook using healthy oils, such as olive, canola, or sunflower oil.  Avoid cooking with butter, cream, or high-fat meats. Meal planning  Eat meals and snacks regularly, preferably at the same times every day. Avoid going long periods of time without eating.  Eat foods that are high in fiber, such as fresh fruits, vegetables, beans, and whole grains. Talk with your dietitian about how many servings of carbs you can eat at each meal.  Eat 4-6 oz (112-168 g) of lean protein each day, such as lean meat, chicken, fish, eggs, or tofu. One ounce (oz) of lean protein is equal to: ? 1 oz (28 g) of meat, chicken, or fish. ? 1 egg. ?  cup (62 g) of tofu.  Eat some foods each day that contain healthy fats, such as avocado, nuts, seeds, and fish.   What foods should I eat? Fruits Berries. Apples. Oranges. Peaches. Apricots. Plums. Grapes. Mango. Papaya. Pomegranate. Kiwi. Cherries. Vegetables Lettuce. Spinach. Leafy greens, including kale, chard, collard greens, and mustard greens. Beets. Cauliflower. Cabbage. Broccoli. Carrots. Green beans. Tomatoes. Peppers. Onions. Cucumbers. Brussels sprouts. Grains Whole grains, such as whole-wheat or whole-grain bread, crackers, tortillas, cereal, and pasta. Unsweetened oatmeal. Quinoa. Brown or wild rice. Meats and other proteins Seafood. Poultry without skin. Lean cuts of poultry and beef. Tofu. Nuts. Seeds. Dairy Low-fat or fat-free dairy products such as milk, yogurt, and cheese. The items listed above may not be a complete list of foods and beverages you can eat. Contact a dietitian for more information. What foods should I avoid? Fruits Fruits canned with  syrup. Vegetables Canned vegetables. Frozen vegetables with butter or cream sauce. Grains Refined white flour and flour products such as bread, pasta, snack foods, and cereals. Avoid all processed foods. Meats and other proteins Fatty cuts of meat. Poultry with skin. Breaded or fried meats. Processed meat. Avoid saturated fats. Dairy Full-fat yogurt, cheese, or milk. Beverages Sweetened drinks, such as soda or iced tea. The items listed above may not be a complete list of foods and beverages you should avoid. Contact a dietitian for more information. Questions to ask a health care provider  Do I need to meet with a diabetes educator?  Do I need to meet with a dietitian?  What number can I call if I have questions?  When are the best times to check my blood glucose? Where to find more information:  American Diabetes Association: diabetes.org  Academy of Nutrition and Dietetics: www.eatright.org  National Institute of Diabetes and Digestive and Kidney Diseases: www.niddk.nih.gov  Association of Diabetes Care and Education Specialists: www.diabeteseducator.org Summary  It is important to have healthy eating   habits because your blood sugar (glucose) levels are greatly affected by what you eat and drink.  A healthy meal plan will help you control your blood glucose and maintain a healthy lifestyle.  Your health care provider may recommend that you work with a dietitian to make a meal plan that is best for you.  Keep in mind that carbohydrates (carbs) and alcohol have immediate effects on your blood glucose levels. It is important to count carbs and to use alcohol carefully. This information is not intended to replace advice given to you by your health care provider. Make sure you discuss any questions you have with your health care provider. Document Revised: 05/31/2019 Document Reviewed: 05/31/2019 Elsevier Patient Education  2021 Elsevier Inc.  

## 2020-08-28 LAB — CBC WITH DIFFERENTIAL/PLATELET
Basophils Absolute: 0 10*3/uL (ref 0.0–0.2)
Basos: 1 %
EOS (ABSOLUTE): 0.1 10*3/uL (ref 0.0–0.4)
Eos: 2 %
Hematocrit: 43.7 % (ref 37.5–51.0)
Hemoglobin: 14.4 g/dL (ref 13.0–17.7)
Immature Grans (Abs): 0 10*3/uL (ref 0.0–0.1)
Immature Granulocytes: 0 %
Lymphocytes Absolute: 0.6 10*3/uL — ABNORMAL LOW (ref 0.7–3.1)
Lymphs: 11 %
MCH: 31.6 pg (ref 26.6–33.0)
MCHC: 33 g/dL (ref 31.5–35.7)
MCV: 96 fL (ref 79–97)
Monocytes Absolute: 0.4 10*3/uL (ref 0.1–0.9)
Monocytes: 8 %
Neutrophils Absolute: 4.3 10*3/uL (ref 1.4–7.0)
Neutrophils: 78 %
Platelets: 155 10*3/uL (ref 150–450)
RBC: 4.56 x10E6/uL (ref 4.14–5.80)
RDW: 11.8 % (ref 11.6–15.4)
WBC: 5.5 10*3/uL (ref 3.4–10.8)

## 2020-08-28 LAB — COMPREHENSIVE METABOLIC PANEL
ALT: 30 IU/L (ref 0–44)
AST: 24 IU/L (ref 0–40)
Albumin/Globulin Ratio: 2.1 (ref 1.2–2.2)
Albumin: 4.7 g/dL (ref 3.8–4.8)
Alkaline Phosphatase: 82 IU/L (ref 44–121)
BUN/Creatinine Ratio: 22 (ref 10–24)
BUN: 28 mg/dL — ABNORMAL HIGH (ref 8–27)
Bilirubin Total: 0.5 mg/dL (ref 0.0–1.2)
CO2: 20 mmol/L (ref 20–29)
Calcium: 9.5 mg/dL (ref 8.6–10.2)
Chloride: 102 mmol/L (ref 96–106)
Creatinine, Ser: 1.26 mg/dL (ref 0.76–1.27)
GFR calc Af Amer: 67 mL/min/{1.73_m2} (ref >59)
GFR calc non Af Amer: 58 mL/min/{1.73_m2} — ABNORMAL LOW (ref >59)
Globulin, Total: 2.2 g/dL (ref 1.5–4.5)
Glucose: 120 mg/dL — ABNORMAL HIGH (ref 65–99)
Potassium: 4.2 mmol/L (ref 3.5–5.2)
Sodium: 142 mmol/L (ref 134–144)
Total Protein: 6.9 g/dL (ref 6.0–8.5)

## 2020-08-28 NOTE — Progress Notes (Signed)
Contacted via [facility] morning Andrew Cobb, your labs continue to be stable and look good for upcoming surgery.  Have a great day!! Keep being awesome!!  Thank you for allowing me to participate in your care. Kindest regards, Aliciana Ricciardi

## 2020-08-29 NOTE — Anesthesia Preprocedure Evaluation (Addendum)
Anesthesia Evaluation  Patient identified by MRN, date of birth, ID band Patient awake    Reviewed: Allergy & Precautions, NPO status , Patient's Chart, lab work & pertinent test results  Airway Mallampati: II  TM Distance: >3 FB Neck ROM: Full    Dental no notable dental hx. (+) Teeth Intact, Dental Advisory Given   Pulmonary asthma , sleep apnea and Continuous Positive Airway Pressure Ventilation , former smoker,    Pulmonary exam normal breath sounds clear to auscultation       Cardiovascular hypertension, Pt. 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), No Residual Symptoms    GI/Hepatic Neg liver ROS, GERD  ,  Endo/Other  diabetes, Well Controlled, Type 2, Oral Hypoglycemic Agents  Renal/GU      Musculoskeletal  (+) Arthritis ,   Abdominal   Peds  Hematology Lab Results      Component                Value               Date                      WBC                      5.5                 08/27/2020                HGB                      14.4                08/27/2020                HCT                      43.7                08/27/2020                MCV                      96                  08/27/2020                PLT                      155                 08/27/2020              Anesthesia Other Findings   Reproductive/Obstetrics negative OB ROS                            Anesthesia Physical Anesthesia Plan  ASA: III  Anesthesia Plan: General   Post-op Pain Management:  Regional for Post-op pain   Induction: Intravenous  PONV Risk Score and Plan: Treatment may vary due to age or medical condition, Ondansetron, Dexamethasone and Midazolam  Airway Management Planned: Oral ETT  Additional Equipment: None  Intra-op Plan:   Post-operative Plan: Extubation in  OR  Informed Consent: I have reviewed the  patients History and Physical, chart, labs and discussed the procedure including the risks, benefits and alternatives for the proposed anesthesia with the patient or authorized representative who has indicated his/her understanding and acceptance.     Dental advisory given  Plan Discussed with: CRNA and Anesthesiologist  Anesthesia Plan Comments: (GA w L ISB)       Anesthesia Quick Evaluation

## 2020-08-30 ENCOUNTER — Ambulatory Visit (HOSPITAL_COMMUNITY): Payer: PPO | Admitting: Anesthesiology

## 2020-08-30 ENCOUNTER — Other Ambulatory Visit: Payer: Self-pay

## 2020-08-30 ENCOUNTER — Ambulatory Visit (HOSPITAL_COMMUNITY)
Admission: RE | Admit: 2020-08-30 | Discharge: 2020-08-30 | Disposition: A | Discharge status: Home / Self Care | Payer: PPO | Attending: Orthopedic Surgery | Admitting: Orthopedic Surgery

## 2020-08-30 ENCOUNTER — Encounter (HOSPITAL_COMMUNITY)
Admission: RE | Disposition: A | Discharge status: Home / Self Care | Payer: Self-pay | Source: Home / Self Care | Attending: Orthopedic Surgery

## 2020-08-30 ENCOUNTER — Ambulatory Visit: Payer: PPO | Admitting: Physician Assistant

## 2020-08-30 ENCOUNTER — Encounter: Payer: Self-pay | Admitting: Physician Assistant

## 2020-08-30 ENCOUNTER — Ambulatory Visit (HOSPITAL_COMMUNITY): Payer: PPO | Admitting: Physician Assistant

## 2020-08-30 ENCOUNTER — Encounter (HOSPITAL_COMMUNITY): Payer: Self-pay | Admitting: Orthopedic Surgery

## 2020-08-30 VITALS — BP 134/70 | HR 55 | Ht 70.0 in | Wt 200.0 lb

## 2020-08-30 DIAGNOSIS — M19112 Post-traumatic osteoarthritis, left shoulder: Secondary | ICD-10-CM | POA: Diagnosis not present

## 2020-08-30 DIAGNOSIS — Z8349 Family history of other endocrine, nutritional and metabolic diseases: Secondary | ICD-10-CM | POA: Insufficient documentation

## 2020-08-30 DIAGNOSIS — I10 Essential (primary) hypertension: Secondary | ICD-10-CM

## 2020-08-30 DIAGNOSIS — Z823 Family history of stroke: Secondary | ICD-10-CM | POA: Diagnosis not present

## 2020-08-30 DIAGNOSIS — Z87891 Personal history of nicotine dependence: Secondary | ICD-10-CM | POA: Diagnosis not present

## 2020-08-30 DIAGNOSIS — Z7984 Long term (current) use of oral hypoglycemic drugs: Secondary | ICD-10-CM | POA: Insufficient documentation

## 2020-08-30 DIAGNOSIS — Z833 Family history of diabetes mellitus: Secondary | ICD-10-CM | POA: Insufficient documentation

## 2020-08-30 DIAGNOSIS — Z9884 Bariatric surgery status: Secondary | ICD-10-CM | POA: Insufficient documentation

## 2020-08-30 DIAGNOSIS — Z01818 Encounter for other preprocedural examination: Secondary | ICD-10-CM | POA: Diagnosis not present

## 2020-08-30 DIAGNOSIS — Z8546 Personal history of malignant neoplasm of prostate: Secondary | ICD-10-CM | POA: Insufficient documentation

## 2020-08-30 DIAGNOSIS — I2510 Atherosclerotic heart disease of native coronary artery without angina pectoris: Secondary | ICD-10-CM

## 2020-08-30 DIAGNOSIS — Z4509 Encounter for adjustment and management of other cardiac device: Secondary | ICD-10-CM | POA: Diagnosis not present

## 2020-08-30 DIAGNOSIS — Z8673 Personal history of transient ischemic attack (TIA), and cerebral infarction without residual deficits: Secondary | ICD-10-CM | POA: Insufficient documentation

## 2020-08-30 DIAGNOSIS — M75102 Unspecified rotator cuff tear or rupture of left shoulder, not specified as traumatic: Secondary | ICD-10-CM | POA: Diagnosis not present

## 2020-08-30 DIAGNOSIS — Z801 Family history of malignant neoplasm of trachea, bronchus and lung: Secondary | ICD-10-CM | POA: Diagnosis not present

## 2020-08-30 DIAGNOSIS — M19012 Primary osteoarthritis, left shoulder: Secondary | ICD-10-CM | POA: Diagnosis present

## 2020-08-30 DIAGNOSIS — Z955 Presence of coronary angioplasty implant and graft: Secondary | ICD-10-CM | POA: Diagnosis not present

## 2020-08-30 DIAGNOSIS — Z79899 Other long term (current) drug therapy: Secondary | ICD-10-CM | POA: Insufficient documentation

## 2020-08-30 DIAGNOSIS — E1151 Type 2 diabetes mellitus with diabetic peripheral angiopathy without gangrene: Secondary | ICD-10-CM | POA: Insufficient documentation

## 2020-08-30 DIAGNOSIS — Z809 Family history of malignant neoplasm, unspecified: Secondary | ICD-10-CM | POA: Insufficient documentation

## 2020-08-30 DIAGNOSIS — Z981 Arthrodesis status: Secondary | ICD-10-CM | POA: Insufficient documentation

## 2020-08-30 DIAGNOSIS — Z800 Family history of malignant neoplasm of digestive organs: Secondary | ICD-10-CM | POA: Insufficient documentation

## 2020-08-30 DIAGNOSIS — M13812 Other specified arthritis, left shoulder: Secondary | ICD-10-CM | POA: Insufficient documentation

## 2020-08-30 DIAGNOSIS — G8918 Other acute postprocedural pain: Secondary | ICD-10-CM | POA: Diagnosis not present

## 2020-08-30 DIAGNOSIS — Z01810 Encounter for preprocedural cardiovascular examination: Secondary | ICD-10-CM | POA: Diagnosis not present

## 2020-08-30 DIAGNOSIS — I639 Cerebral infarction, unspecified: Secondary | ICD-10-CM | POA: Diagnosis not present

## 2020-08-30 DIAGNOSIS — Z7982 Long term (current) use of aspirin: Secondary | ICD-10-CM | POA: Insufficient documentation

## 2020-08-30 HISTORY — PX: REVERSE SHOULDER ARTHROPLASTY: SHX5054

## 2020-08-30 LAB — GLUCOSE, CAPILLARY
Glucose-Capillary: 92 mg/dL (ref 70–99)
Glucose-Capillary: 113 mg/dL — ABNORMAL HIGH (ref 70–99)

## 2020-08-30 LAB — CUP PACEART INCLINIC DEVICE CHECK
Date Time Interrogation Session: 20220224092744
Implantable Pulse Generator Implant Date: 20200316

## 2020-08-30 SURGERY — ARTHROPLASTY, SHOULDER, TOTAL, REVERSE
Anesthesia: General | Site: Shoulder | Laterality: Left

## 2020-08-30 MED ORDER — PHENYLEPHRINE HCL-NACL 10-0.9 MG/250ML-% IV SOLN
INTRAVENOUS | Status: DC | PRN
  Administered 2020-08-30: 75 ug/min via INTRAVENOUS

## 2020-08-30 MED ORDER — ROCURONIUM BROMIDE 10 MG/ML (PF) SYRINGE
PREFILLED_SYRINGE | INTRAVENOUS | Status: DC | PRN
  Administered 2020-08-30: 80 mg via INTRAVENOUS
  Administered 2020-08-30: 60 mg via INTRAVENOUS

## 2020-08-30 MED ORDER — PHENYLEPHRINE HCL (PRESSORS) 10 MG/ML IV SOLN
INTRAVENOUS | Status: AC
  Filled 2020-08-30: qty 1

## 2020-08-30 MED ORDER — EPHEDRINE SULFATE-NACL 50-0.9 MG/10ML-% IV SOSY
PREFILLED_SYRINGE | INTRAVENOUS | Status: DC | PRN
  Administered 2020-08-30 (×5): 10 mg via INTRAVENOUS

## 2020-08-30 MED ORDER — EPHEDRINE 5 MG/ML INJ
INTRAVENOUS | Status: AC
  Filled 2020-08-30: qty 10

## 2020-08-30 MED ORDER — CHLORHEXIDINE GLUCONATE 0.12 % MT SOLN
15.0000 mL | Freq: Once | OROMUCOSAL | Status: AC
  Administered 2020-08-30: 15 mL via OROMUCOSAL

## 2020-08-30 MED ORDER — DEXAMETHASONE SODIUM PHOSPHATE 10 MG/ML IJ SOLN
INTRAMUSCULAR | Status: DC | PRN
  Administered 2020-08-30: 8 mg via INTRAVENOUS

## 2020-08-30 MED ORDER — CYCLOBENZAPRINE HCL 10 MG PO TABS: 10.0000 mg | ORAL_TABLET | Freq: Three times a day (TID) | ORAL | 1 refills | Status: DC | PRN

## 2020-08-30 MED ORDER — SUGAMMADEX SODIUM 200 MG/2ML IV SOLN
INTRAVENOUS | Status: DC | PRN
  Administered 2020-08-30: 400 mg via INTRAVENOUS

## 2020-08-30 MED ORDER — FENTANYL CITRATE (PF) 250 MCG/5ML IJ SOLN
INTRAMUSCULAR | Status: DC | PRN
  Administered 2020-08-30: 100 ug via INTRAVENOUS

## 2020-08-30 MED ORDER — ONDANSETRON HCL 4 MG/2ML IJ SOLN: 4.0000 mg | Freq: Once | INTRAMUSCULAR | Status: DC | PRN

## 2020-08-30 MED ORDER — STERILE WATER FOR IRRIGATION IR SOLN
Status: DC | PRN
  Administered 2020-08-30: 2000 mL

## 2020-08-30 MED ORDER — ROCURONIUM BROMIDE 10 MG/ML (PF) SYRINGE
PREFILLED_SYRINGE | INTRAVENOUS | Status: AC
  Filled 2020-08-30: qty 10

## 2020-08-30 MED ORDER — VANCOMYCIN HCL 1000 MG IV SOLR
INTRAVENOUS | Status: AC
  Filled 2020-08-30: qty 1000

## 2020-08-30 MED ORDER — 0.9 % SODIUM CHLORIDE (POUR BTL) OPTIME
TOPICAL | Status: DC | PRN
  Administered 2020-08-30: 1000 mL

## 2020-08-30 MED ORDER — VANCOMYCIN HCL 2000 MG/400ML IV SOLN
2000.0000 mg | INTRAVENOUS | Status: AC
  Administered 2020-08-30: 2000 mg via INTRAVENOUS
  Filled 2020-08-30: qty 400

## 2020-08-30 MED ORDER — LIDOCAINE 2% (20 MG/ML) 5 ML SYRINGE
INTRAMUSCULAR | Status: DC | PRN
  Administered 2020-08-30: 100 mg via INTRAVENOUS

## 2020-08-30 MED ORDER — OXYCODONE-ACETAMINOPHEN 5-325 MG PO TABS: 1.0000 | ORAL_TABLET | ORAL | 0 refills | Status: DC | PRN

## 2020-08-30 MED ORDER — FENTANYL CITRATE (PF) 100 MCG/2ML IJ SOLN
INTRAMUSCULAR | Status: AC
  Filled 2020-08-30: qty 2

## 2020-08-30 MED ORDER — BUPIVACAINE LIPOSOME 1.3 % IJ SUSP
INTRAMUSCULAR | Status: DC | PRN
  Administered 2020-08-30: 10 mL via PERINEURAL

## 2020-08-30 MED ORDER — PROPOFOL 10 MG/ML IV BOLUS
INTRAVENOUS | Status: DC | PRN
Start: 2020-08-30 — End: 2020-08-30
  Administered 2020-08-30: 150 mg via INTRAVENOUS

## 2020-08-30 MED ORDER — ACETAMINOPHEN 10 MG/ML IV SOLN: 1000.0000 mg | Freq: Once | INTRAVENOUS | Status: DC | PRN

## 2020-08-30 MED ORDER — CEFAZOLIN SODIUM-DEXTROSE 2-4 GM/100ML-% IV SOLN
2.0000 g | INTRAVENOUS | Status: AC
  Administered 2020-08-30: 2 g via INTRAVENOUS
  Filled 2020-08-30: qty 100

## 2020-08-30 MED ORDER — ONDANSETRON HCL 4 MG/2ML IJ SOLN
INTRAMUSCULAR | Status: AC
  Filled 2020-08-30: qty 2

## 2020-08-30 MED ORDER — FENTANYL CITRATE (PF) 100 MCG/2ML IJ SOLN
50.0000 ug | Freq: Once | INTRAMUSCULAR | Status: AC
  Administered 2020-08-30: 100 ug via INTRAVENOUS
  Filled 2020-08-30: qty 2

## 2020-08-30 MED ORDER — LIDOCAINE HCL (PF) 2 % IJ SOLN
INTRAMUSCULAR | Status: AC
  Filled 2020-08-30: qty 5

## 2020-08-30 MED ORDER — FENTANYL CITRATE (PF) 100 MCG/2ML IJ SOLN: 25.0000 ug | INTRAMUSCULAR | Status: DC | PRN

## 2020-08-30 MED ORDER — PROPOFOL 10 MG/ML IV BOLUS
INTRAVENOUS | Status: AC
  Filled 2020-08-30: qty 20

## 2020-08-30 MED ORDER — PHENYLEPHRINE 40 MCG/ML (10ML) SYRINGE FOR IV PUSH (FOR BLOOD PRESSURE SUPPORT)
PREFILLED_SYRINGE | INTRAVENOUS | Status: AC
  Filled 2020-08-30: qty 20

## 2020-08-30 MED ORDER — VANCOMYCIN HCL 1000 MG IV SOLR
INTRAVENOUS | Status: DC | PRN
  Administered 2020-08-30: 1000 mg via TOPICAL

## 2020-08-30 MED ORDER — LACTATED RINGERS IV SOLN: INTRAVENOUS | Status: DC

## 2020-08-30 MED ORDER — ONDANSETRON HCL 4 MG PO TABS: 4.0000 mg | ORAL_TABLET | Freq: Three times a day (TID) | ORAL | 0 refills | Status: DC | PRN

## 2020-08-30 MED ORDER — LACTATED RINGERS IV BOLUS
500.0000 mL | Freq: Once | INTRAVENOUS | Status: AC
  Administered 2020-08-30: 500 mL via INTRAVENOUS

## 2020-08-30 MED ORDER — MIDAZOLAM HCL 2 MG/2ML IJ SOLN
1.0000 mg | Freq: Once | INTRAMUSCULAR | Status: AC
  Administered 2020-08-30: 2 mg via INTRAVENOUS
  Filled 2020-08-30: qty 2

## 2020-08-30 MED ORDER — ORAL CARE MOUTH RINSE: 15.0000 mL | Freq: Once | OROMUCOSAL | Status: AC

## 2020-08-30 MED ORDER — DEXAMETHASONE SODIUM PHOSPHATE 10 MG/ML IJ SOLN
INTRAMUSCULAR | Status: AC
  Filled 2020-08-30: qty 1

## 2020-08-30 MED ORDER — LACTATED RINGERS IV BOLUS
250.0000 mL | Freq: Once | INTRAVENOUS | Status: AC
  Administered 2020-08-30: 250 mL via INTRAVENOUS

## 2020-08-30 MED ORDER — TRANEXAMIC ACID-NACL 1000-0.7 MG/100ML-% IV SOLN
1000.0000 mg | INTRAVENOUS | Status: AC
  Administered 2020-08-30: 1000 mg via INTRAVENOUS
  Filled 2020-08-30: qty 100

## 2020-08-30 MED ORDER — BUPIVACAINE HCL (PF) 0.5 % IJ SOLN
INTRAMUSCULAR | Status: DC | PRN
  Administered 2020-08-30: 14 mL

## 2020-08-30 SURGICAL SUPPLY — 77 items
ADH SKN CLS APL DERMABOND .7 (GAUZE/BANDAGES/DRESSINGS) ×1
AID PSTN UNV HD RSTRNT DISP (MISCELLANEOUS) ×1
BAG SPEC THK2 15X12 ZIP CLS (MISCELLANEOUS) ×1
BAG ZIPLOCK 12X15 (MISCELLANEOUS) ×2 IMPLANT
BLADE SAW SGTL 83.5X18.5 (BLADE) ×2 IMPLANT
BSPLAT GLND +2X24 MDLR (Joint) ×1 IMPLANT
COOLER ICEMAN CLASSIC (MISCELLANEOUS) IMPLANT
COVER BACK TABLE 60X90IN (DRAPES) ×2 IMPLANT
COVER SURGICAL LIGHT HANDLE (MISCELLANEOUS) ×2 IMPLANT
COVER WAND RF STERILE (DRAPES) IMPLANT
CUP SUT UNIV REVERS 39 NEU (Shoulder) ×1 IMPLANT
DERMABOND ADVANCED (GAUZE/BANDAGES/DRESSINGS) ×1
DERMABOND ADVANCED .7 DNX12 (GAUZE/BANDAGES/DRESSINGS) ×1 IMPLANT
DRAPE INCISE IOBAN 66X45 STRL (DRAPES) IMPLANT
DRAPE ORTHO SPLIT 77X108 STRL (DRAPES) ×4
DRAPE SHEET LG 3/4 BI-LAMINATE (DRAPES) ×2 IMPLANT
DRAPE SURG 17X11 SM STRL (DRAPES) ×2 IMPLANT
DRAPE SURG ORHT 6 SPLT 77X108 (DRAPES) ×2 IMPLANT
DRAPE U-SHAPE 47X51 STRL (DRAPES) ×2 IMPLANT
DRESSING AQUACEL AG SP 3.5X10 (GAUZE/BANDAGES/DRESSINGS) IMPLANT
DRSG AQUACEL AG ADV 3.5X10 (GAUZE/BANDAGES/DRESSINGS) ×2 IMPLANT
DRSG AQUACEL AG SP 3.5X10 (GAUZE/BANDAGES/DRESSINGS) ×2
DURAPREP 26ML APPLICATOR (WOUND CARE) ×2 IMPLANT
ELECT BLADE TIP CTD 4 INCH (ELECTRODE) ×2 IMPLANT
ELECT REM PT RETURN 15FT ADLT (MISCELLANEOUS) ×2 IMPLANT
FACESHIELD WRAPAROUND (MASK) ×8 IMPLANT
FACESHIELD WRAPAROUND OR TEAM (MASK) ×4 IMPLANT
GLENOID UNI REV MOD 24 +2 LAT (Joint) ×1 IMPLANT
GLENOSPHERE 39+4 LAT/24 UNI RV (Joint) ×1 IMPLANT
GLOVE SS BIOGEL STRL SZ 7 (GLOVE) ×1 IMPLANT
GLOVE SS BIOGEL STRL SZ 7.5 (GLOVE) ×1 IMPLANT
GLOVE SUPERSENSE BIOGEL SZ 7 (GLOVE) ×1
GLOVE SUPERSENSE BIOGEL SZ 7.5 (GLOVE) ×1
GLOVE SURG ENC MOIS LTX SZ7.5 (GLOVE) ×2 IMPLANT
GLOVE SURG ENC MOIS LTX SZ8 (GLOVE) ×2 IMPLANT
GLOVE SURG POLYISO LF SZ7 (GLOVE) ×1 IMPLANT
GLOVE SURG SYN 7.0 (GLOVE) IMPLANT
GLOVE SURG SYN 7.0 PF PI (GLOVE) IMPLANT
GLOVE SURG SYN 7.5  E (GLOVE)
GLOVE SURG SYN 7.5 E (GLOVE) IMPLANT
GLOVE SURG SYN 7.5 PF PI (GLOVE) IMPLANT
GLOVE SURG SYN 8.0 (GLOVE) IMPLANT
GLOVE SURG SYN 8.0 PF PI (GLOVE) IMPLANT
GOWN STRL REUS W/TWL LRG LVL3 (GOWN DISPOSABLE) ×4 IMPLANT
INSERT HUMERAL 39/+6 (Insert) ×1 IMPLANT
KIT BASIN OR (CUSTOM PROCEDURE TRAY) ×2 IMPLANT
KIT TURNOVER KIT A (KITS) ×2 IMPLANT
MANIFOLD NEPTUNE II (INSTRUMENTS) ×2 IMPLANT
NDL TAPERED W/ NITINOL LOOP (MISCELLANEOUS) ×1 IMPLANT
NEEDLE TAPERED W/ NITINOL LOOP (MISCELLANEOUS) ×2 IMPLANT
NS IRRIG 1000ML POUR BTL (IV SOLUTION) ×2 IMPLANT
PACK SHOULDER (CUSTOM PROCEDURE TRAY) ×2 IMPLANT
PAD ARMBOARD 7.5X6 YLW CONV (MISCELLANEOUS) ×2 IMPLANT
PAD COLD SHLDR WRAP-ON (PAD) IMPLANT
PIN NITINOL TARGETER 2.8 (PIN) IMPLANT
PIN SET MODULAR GLENOID SYSTEM (PIN) IMPLANT
RESTRAINT HEAD UNIVERSAL NS (MISCELLANEOUS) ×2 IMPLANT
SCREW CENTRAL MOD 30MM (Screw) ×1 IMPLANT
SCREW PERI LOCK 5.5X32 (Screw) ×2 IMPLANT
SCREW PERIPHERAL 5.5X20 LOCK (Screw) ×2 IMPLANT
SLING ARM FOAM STRAP LRG (SOFTGOODS) ×1 IMPLANT
SLING ARM FOAM STRAP MED (SOFTGOODS) IMPLANT
SLING ARM FOAM STRAP XLG (SOFTGOODS) ×1 IMPLANT
SPONGE LAP 18X18 RF (DISPOSABLE) IMPLANT
STEM HUMERAL UNI REVERS SZ9 (Stem) ×1 IMPLANT
SUCTION FRAZIER HANDLE 12FR (TUBING) ×2
SUCTION TUBE FRAZIER 12FR DISP (TUBING) ×1 IMPLANT
SUT FIBERWIRE #2 38 T-5 BLUE (SUTURE)
SUT MNCRL AB 3-0 PS2 18 (SUTURE) ×2 IMPLANT
SUT MON AB 2-0 CT1 36 (SUTURE) ×2 IMPLANT
SUT VIC AB 1 CT1 36 (SUTURE) ×2 IMPLANT
SUTURE FIBERWR #2 38 T-5 BLUE (SUTURE) IMPLANT
SUTURE TAPE 1.3 40 TPR END (SUTURE) ×2 IMPLANT
SUTURETAPE 1.3 40 TPR END (SUTURE) ×4
TOWEL OR 17X26 10 PK STRL BLUE (TOWEL DISPOSABLE) ×2 IMPLANT
TOWEL OR NON WOVEN STRL DISP B (DISPOSABLE) ×2 IMPLANT
WATER STERILE IRR 1000ML POUR (IV SOLUTION) ×4 IMPLANT

## 2020-08-30 NOTE — Discharge Instructions (Signed)
 Kevin M. Supple, M.D., F.A.A.O.S. Orthopaedic Surgery Specializing in Arthroscopic and Reconstructive Surgery of the Shoulder [phone] [address] - [place], [place] [address] - Fax [phone]   POST-OP TOTAL SHOULDER REPLACEMENT INSTRUCTIONS  1. Follow up in the office for your first post-op appointment 10-14 days from the date of your surgery. If you do not already have a scheduled appointment, our office will contact you to schedule.  2. The bandage over your incision is waterproof. You may begin showering with this dressing on. You may leave this dressing on until first follow up appointment within 2 weeks. We prefer you leave this dressing in place until follow up however after 5-7 days if you are having itching or skin irritation and would like to remove it you may do so. Go slow and tug at the borders gently to break the bond the dressing has with the skin. At this point if there is no drainage it is okay to go without a bandage or you may cover it with a light guaze and tape. You can also expect significant bruising around your shoulder that will drift down your arm and into your chest wall. This is very normal and should resolve over several days.   3. Wear your sling/immobilizer at all times except to perform the exercises below or to occasionally let your arm dangle by your side to stretch your elbow. You also need to sleep in your sling immobilizer until instructed otherwise. It is ok to remove your sling if you are sitting in a controlled environment and allow your arm to rest in a position of comfort by your side or on your lap with pillows to give your neck and skin a break from the sling. You may remove it to allow arm to dangle by side to shower. If you are up walking around and when you go to sleep at night you need to wear it.  4. Range of motion to your elbow, wrist, and hand are encouraged 3-5 times daily. Exercise to your hand and fingers helps to reduce  swelling you may experience.   5. Prescriptions for a pain medication and a muscle relaxant are provided for you. It is recommended that if you are experiencing pain that you pain medication alone is not controlling, add the muscle relaxant along with the pain medication which can give additional pain relief. The first 1-2 days is generally the most severe of your pain and then should gradually decrease. As your pain lessens it is recommended that you decrease your use of the pain medications to an "as needed basis'" only and to always comply with the recommended dosages of the pain medications.  6. Pain medications can produce constipation along with their use. If you experience this, the use of an over the counter stool softener or laxative daily is recommended.   7. For additional questions or concerns, please do not hesitate to call the office. If after hours there is an answering service to forward your concerns to the physician on call.  8.Pain control following an exparel block  To help control your post-operative pain you received a nerve block  performed with Exparel which is a long acting anesthetic (numbing agent) which can provide pain relief and sensations of numbness (and relief of pain) in the operative shoulder and arm for up to 3 days. Sometimes it provides mixed relief, meaning you may still have numbness in certain areas of the arm but can still be able to   move  parts of that arm, hand, and fingers. We recommend that your prescribed pain medications  be used as needed. We do not feel it is necessary to "pre medicate" and "stay ahead" of pain.  Taking narcotic pain medications when you are not having any pain can lead to unnecessary and potentially dangerous side effects.    9. Use the ice machine as much as possible in the first 5-7 days from surgery, then you can wean its use to as needed. The ice typically needs to be replaced every 6 hours, instead of ice you can actually freeze  water bottles to put in the cooler and then fill water around them to avoid having to purchase ice. You can have spare water bottles freezing to allow you to rotate them once they have melted. Try to have a thin shirt or light cloth or towel under the ice wrap to protect your skin.   FOR ADDITIONAL INFO ON ICE MACHINE AND INSTRUCTIONS GO TO THE WEBSITE AT  https://www.djoglobal.com/products/donjoy/donjoy-iceman-[email or web address]  10.  We recommend that you avoid any dental work or cleaning in the first 3 months following your joint replacement. This is to help minimize the possibility of infection from the bacteria in your mouth that enters your bloodstream during dental work. We also recommend that you take an antibiotic prior to your dental work for the first year after your shoulder replacement to further help reduce that risk. Please simply contact our office for antibiotics to be sent to your pharmacy prior to dental work.  11. Dental Antibiotics:  In most cases prophylactic antibiotics for Dental procdeures after total joint surgery are not necessary.  Exceptions are as follows:  1. History of prior total joint infection  2. Severely immunocompromised (Organ Transplant, cancer chemotherapy, Rheumatoid biologic meds such as Humera)  3. Poorly controlled diabetes (A1C &gt; 8.0, blood glucose over 200)  If you have one of these conditions, contact your surgeon for an antibiotic prescription, prior to your dental procedure.   POST-OP EXERCISES  Pendulum Exercises  Perform pendulum exercises while standing and bending at the waist. Support your uninvolved arm on a table or chair and allow your operated arm to hang freely. Make sure to do these exercises passively - not using you shoulder muscles. These exercises can be performed once your nerve block effects have worn off.  Repeat 20 times. Do 3 sessions per day.     

## 2020-08-30 NOTE — Transfer of Care (Signed)
Immediate Anesthesia Transfer of Care Note  Patient: Andrew Cobb  Procedure(s) Performed: REVERSE SHOULDER ARTHROPLASTY (Left Shoulder)  Patient Location: PACU  Anesthesia Type:General  Level of Consciousness: awake, alert  and oriented  Airway & Oxygen Therapy: Patient Spontanous Breathing and Patient connected to face mask  Post-op Assessment: Report given to RN and Post -op Vital signs reviewed and stable  Post vital signs: Reviewed and stable  Last Vitals:  Vitals Value Taken Time  BP 131/69 08/30/20 1704  Temp    Pulse 71 08/30/20 1707  Resp 18 08/30/20 1708  SpO2 100 % 08/30/20 1707  Vitals shown include unvalidated device data.  Last Pain:  Vitals:   08/30/20 1412  TempSrc:   PainSc: 0-No pain         Complications: No complications documented.

## 2020-08-30 NOTE — Op Note (Signed)
08/30/2020  4:43 PM  PATIENT:   Andrew Cobb  70 y.o. male  PRE-OPERATIVE DIAGNOSIS:  left shoulder rotator cuff tear arthropathy  POST-OPERATIVE DIAGNOSIS: Same  PROCEDURE: Left shoulder reverse arthroplasty utilizing a press-fit size 9 Arthrex stem with a neutral metaphysis, +6 polyethylene insert, 39/+4 glenosphere on a small/+2 baseplate  SURGEON:  Supple, Metta Clines M.D.  ASSISTANTS: Jenetta Loges, PA-C  ANESTHESIA:   General endotracheal and interscalene block with Exparel  EBL: 200 cc  SPECIMEN: None  Drains: None   PATIENT DISPOSITION:  PACU - hemodynamically stable.    PLAN OF CARE: Discharge to home after PACU  Brief history:  Andrew Cobb is a 70 year old gentleman who has had chronic and progressively increasing left shoulder pain related to severe rotator cuff tear arthropathy.  He has a remote history of rotator cuff repair and current examination shows severely painful and guarded motion with profound weakness.  Plain radiographs confirm retained metallic suture anchor within the humeral head as well as a high riding humeral head and narrowing of the acromial humeral interval and changes consistent with chronic rotator cuff tear arthropathy.  Due to his increasing functional rotations and failure to respond to prolonged attempts at conservative management he is brought to the operating at this time for planned left shoulder reverse arthroplasty.  Preoperatively, I counseled the patient regarding treatment options and risks versus benefits thereof.  Possible surgical complications were all reviewed including potential for bleeding, infection, neurovascular injury, persistent pain, loss of motion, anesthetic complication, failure of the implant, and possible need for additional surgery. They understand and accept and agrees with our planned procedure.   Procedure detail:  After undergoing routine preop evaluation the patient received prophylactic antibiotics and  interscalene block with Exparel was established in the holding area by the anesthesia department.  Patient was subsequently placed supine on the operating table and underwent the smooth induction of a general endotracheal anesthesia.  Subsequently placed into the beachchair position and appropriately padded and protected.  Left shoulder girdle region was then sterilely prepped and draped in standard fashion.  Timeout was called.  Deltopectoral approach left shoulder is made to an approximate 10 cm incision.  Skin flaps were elevated dissection carried deeply and the deltopectoral interval was then developed from proximal to distal with the vein taken laterally.  Upper centimeter the pectoralis major was tenotomized for exposure conjoined tendon retracted medially and adhesions were divided from the deltoid.  There had been previous rupture of the long head biceps tendon.  The remnant of the subscapularis was dissected from the lesser tuberosity and tagged with a pair of suture tape sutures and then capsular attachments were divided in a subperiosteal fashion from the anterior and inferior margins of the humeral neck allowing deliver the humeral head through the wound.  Extra medullary guide was then used to outline the proposed humeral head resection which was performed with an oscillating saw the native retroversion approximate 20degrees.  We then identified the retained metallic suture anchor which was removed without difficulty.  A metal cap was then placed over the cut proximal humeral surface.  We then exposed the glenoid with the appropriate retractors and performed a circumferential labral resection and once clear visualization of the entire to the glenoid was achieved we placed a guidepin into the center of the glenoid with an approximately 10 degree inferior tilt and the glenoid was then reamed with the central followed by the peripheral reamer and all debris was then carefully removed.  Preparation  completed with the central drill and tapped for a 30 mm lag screw.  The baseplate was assembled and inserted with vancomycin powder applied to the threads.  Excellent purchase and fixation was achieved.  The peripheral locking screws were all then placed using standard technique and again with vancomycin on the threads and excellent purchase and fixation achieved.  A 39/+4 glenosphere was then impacted onto the baseplate and the central locking screw was placed.  We then returned our attention to the proximal humerus where the canal was opened with hand reamers and we broached up to size 9 at 20 degrees of retroversion with excellent purchase and fixation.  A neutral reaming guide was then used to prepare the metaphysis.  A trial reduction was then performed which showed excellent motion good stability good soft tissue balance.  At this time the trial was removed.  Our final implant was assembled on the back table and vancomycin powder was instilled in the humeral canal and the implant was then impacted with excellent fit and fixation.  Series of trial reductions ultimately showed +6 poly to give [place] the best soft tissue balance.  The trial was removed the implant was cleaned and dried and the final +6 polywas then impacted and final reduction showed excellent motion good stability and good soft tissue balance.  The subscapularis was then confirmed to be mobilized.  It was repaired back to the eyelets on the collar of our implant utilizing the previously placed suture tape sutures.  The arm easily achieved 45 degrees of external rotation upon completion.  Final irrigation was then completed.  Hemostasis was obtained.  The balance of the vancomycin powder was then and spread liberally throughout the soft tissue planes.  Deltopectoral interval reapproximated with a series of figure-of-eight and 1 Vicryl sutures.  2-0 Vicryl used for subcu layer and intracuticular 3 Monocryl for the skin followed by Dermabond Aquasol  dressing.  The left arm was then placed into a sling and the patient was awakened, extubated, and taken to recovery in stable addition.  Jenetta Loges, PA-C was utilized as an Environmental consultant throughout this case, essential for help with positioning the patient, positioning extremity, tissue manipulation, implantation of the prosthesis, suture management, wound closure, and intraoperative decision-making.  Marin Shutter MD   Contact # [phone]

## 2020-08-30 NOTE — Anesthesia Postprocedure Evaluation (Signed)
Anesthesia Post Note  Patient: Andrew Cobb  Procedure(s) Performed: REVERSE SHOULDER ARTHROPLASTY (Left Shoulder)     Patient location during evaluation: PACU Anesthesia Type: General Level of consciousness: awake and alert Pain management: pain level controlled Vital Signs Assessment: post-procedure vital signs reviewed and stable Respiratory status: spontaneous breathing, nonlabored ventilation, respiratory function stable and patient connected to nasal cannula oxygen Cardiovascular status: blood pressure returned to baseline and stable Postop Assessment: no apparent nausea or vomiting Anesthetic complications: no   No complications documented.  Last Vitals:  Vitals:   08/30/20 1730 08/30/20 1745  BP: 99/61 117/79  Pulse: 75 79  Resp: 19 13  Temp: (!) 36.1 C (!) 36.2 C  SpO2: 91% 94%    Last Pain:  Vitals:   08/30/20 1745  TempSrc:   PainSc: 0-No pain                 Barnet Glasgow

## 2020-08-30 NOTE — Anesthesia Procedure Notes (Signed)
Procedure Name: Intubation Performed by: Niah Heinle A, CRNA Pre-anesthesia Checklist: Patient identified, Emergency Drugs available, Suction available and Patient being monitored Patient Re-evaluated:Patient Re-evaluated prior to induction Oxygen Delivery Method: Circle system utilized Preoxygenation: Pre-oxygenation with 100% oxygen Induction Type: IV induction Ventilation: Mask ventilation without difficulty Laryngoscope Size: Miller and 2 Grade View: Grade I Tube type: Oral Number of attempts: 1 Airway Equipment and Method: Stylet Placement Confirmation: ETT inserted through vocal cords under direct vision,  positive ETCO2 and breath sounds checked- equal and bilateral Secured at: 22 cm Tube secured with: Tape Dental Injury: Teeth and Oropharynx as per pre-operative assessment        

## 2020-08-30 NOTE — Anesthesia Procedure Notes (Addendum)
Anesthesia Regional Block: Interscalene brachial plexus block   Pre-Anesthetic Checklist: ,, timeout performed, Correct Patient, Correct Site, Correct Laterality, Correct Procedure, Correct Position, site marked, Risks and benefits discussed,  Surgical consent,  Pre-op evaluation,  At surgeon's request and post-op pain management  Laterality: Upper and Left  Prep: Maximum Sterile Barrier Precautions used, chloraprep       Needles:  Injection technique: Single-shot  Needle Type: Echogenic Needle     Needle Length: 5cm  Needle Gauge: 21     Additional Needles:   Procedures:,,,, ultrasound used (permanent image in chart),,,,  Narrative:  Start time: 08/30/2020 2:21 PM End time: 08/30/2020 2:28 PM Injection made incrementally with aspirations every 5 mL.  Performed by: Personally  Anesthesiologist: Barnet Glasgow, MD  Additional Notes: Block assessed prior to procedure. Patient tolerated procedure well.

## 2020-08-30 NOTE — Patient Instructions (Signed)
Medication Instructions:   Your physician recommends that you continue on your current medications as directed. Please refer to the Current Medication list given to you today.  *If you need a refill on your cardiac medications before your next appointment, please call your pharmacy*   Lab Work: NONE ORDERED  TODAY   If you have labs (blood work) drawn today and your tests are completely normal, you will receive your results only by: . MyChart Message (if you have MyChart) OR . A paper copy in the mail If you have any lab test that is abnormal or we need to change your treatment, we will call you to review the results.   Testing/Procedures: NONE ORDERED  TODAY   Follow-Up: At [facility], you and your health needs are our priority.  As part of our continuing mission to provide you with exceptional heart care, we have created designated Provider Care Teams.  These Care Teams include your primary Cardiologist (physician) and Advanced Practice Providers (APPs -  Physician Assistants and Nurse Practitioners) who all work together to provide you with the care you need, when you need it.  We recommend signing up for the patient portal called "MyChart".  Sign up information is provided on this After Visit Summary.  MyChart is used to connect with patients for Virtual Visits (Telemedicine).  Patients are able to view lab/test results, encounter notes, upcoming appointments, etc.  Non-urgent messages can be sent to your provider as well.   To learn more about what you can do with MyChart, go to [email or web address].    Your next appointment:   1 year(s)  The format for your next appointment:   In Person  Provider:   You may see Dr Taylor  or one of the following Advanced Practice Providers on your designated Care Team:    Amber Seiler, NP  Renee Ursuy, PA-C  Michael "Andy" Tillery, PA-C    Other Instructions   

## 2020-08-30 NOTE — Progress Notes (Signed)
Assisted Dr. Valma Cava with left, ultrasound guided, interscalene  block. Side rails up, monitors on throughout procedure. See vital signs in flow sheet. Tolerated Procedure well.

## 2020-08-30 NOTE — H&P (Signed)
Andrew Cobb    Chief Complaint: left shoulder rotator cuff tear arthropathy HPI: The patient is a 70 y.o. male with remote history of left shoulder open rotator cuff repair, now with progressive increasing pain and functional imitations with underlying rotator cuff tear arthropathy.  Due to his failure to respond to prolonged attempts at conservative management he is brought to the operating this time for planned left shoulder reverse arthroplasty.  Past Medical History:  Diagnosis Date  . Arthritis   . Asthma    has not needed since 6/21  . Cancer [facility])    prostate  . Chronic venous insufficiency    varicose vein lower extremity with inflammation  . Coronary artery disease 1996   two stents placed   . Diabetes mellitus without complication ([place])    type 2 on metformin  . GERD (gastroesophageal reflux disease)    no issues since gastric bypass surgery as stated per pt  . Hyperlipidemia   . Hypertension   . Hypogonadism in male   . MRSA (methicillin resistant Staphylococcus aureus) infection    07/30/2008 thru 08/07/2008  abdominal abcess  . Sleep apnea    on BIPAP  . Stented coronary artery   . Stroke ([place]) 09/07/2019   no defecits  . Thrombocythemia     Past Surgical History:  Procedure Laterality Date  . ANGIOPLASTY    . ANGIOPLASTY     with stent 04/07/1995  . ANTERIOR CERVICAL DECOMPRESSION/DISCECTOMY FUSION 4 LEVELS N/A 08/17/2017   Procedure: Anterior discectomy with fusion and plate fixation Cervical Three-Four, Four-Five, Five-Six, and Six-Seven Fusion;  Surgeon: Ditty, Kevan Ny, MD;  Location: [place];  Service: Neurosurgery;  Laterality: N/A;  Anterior discectomy with fusion and plate fixation Cervical Three-Four, Four-Five, Five-Six, and Six-Seven Fusion   . APPENDECTOMY     1966  . BUBBLE STUDY  09/20/2018   Procedure: BUBBLE STUDY;  Surgeon: Josue Hector, MD;  Location: [place];  Service: Cardiovascular;;  . [place]  .  CARDIOVASCULAR STRESS TEST     07/31/2011  . CARPAL TUNNEL RELEASE Left   . CHOLECYSTECTOMY     2006  . colonscopy      08/25/2012  . EYE SURGERY Bilateral    cataract  . FUNCTIONAL ENDOSCOPIC SINUS SURGERY     11/10/2013  . GASTRIC BYPASS     10/05/2012  . HERNIA REPAIR     left inguinal 1981  . INCISION AND DRAINAGE ABSCESS Right 02/26/2017   Procedure: INCISION AND DRAINAGE ABSCESS;  Surgeon: Nickie Retort, MD;  Location: [facility];  Service: Urology;  Laterality: Right;  . JOINT REPLACEMENT     bilateral  . left ankle surgery      05/03/2003   . left carpel tunnel      09/18/1993  . left knee meniscal tear      01/25/2010  . left knee meniscal tear repair      05/04/1996  . left rotator cuff repair      05/03/2003   . LOOP RECORDER INSERTION N/A 09/20/2018   Procedure: LOOP RECORDER INSERTION;  Surgeon: Evans Lance, MD;  Location: [place] [facility];  Service: Cardiovascular;  Laterality: N/A;  . REPLACEMENT TOTAL KNEE BILATERAL  07/13/2015  . right ankle surgery      fracture has 2 screws 07/07/1997  . right carpel tunnel      05/16/1992  . right shoulder replacement      01/27/2006  . SCROTAL EXPLORATION Right  02/26/2017   Procedure: SCROTUM EXPLORATION;  Surgeon: Nickie Retort, MD;  Location: [facility];  Service: Urology;  Laterality: Right;  . TEE WITHOUT CARDIOVERSION N/A 09/20/2018   Procedure: TRANSESOPHAGEAL ECHOCARDIOGRAM (TEE);  Surgeon: Josue Hector, MD;  Location: [facility];  Service: Cardiovascular;  Laterality: N/A;  . TOTAL KNEE ARTHROPLASTY Left 07/13/2015   Procedure: LEFT TOTAL KNEE ARTHROPLASTY;  Surgeon: Gaynelle Arabian, MD;  Location: [facility];  Service: Orthopedics;  Laterality: Left;    Family History  Problem Relation Age of Onset  . Cancer Mother        pancreatic  . Diabetes Mother   . Stroke Mother   . Heart disease Mother   . Hyperlipidemia Mother   . Hypertension Mother   . Heart attack Mother   . Heart disease Father   .  Stroke Father   . Diabetes Father   . Hypertension Father   . Heart attack Father   . Hyperlipidemia Father   . Pancreatic cancer Father   . Cancer Sister   . Cancer Brother        lung  . Cancer Brother   . Kidney cancer Neg Hx   . Bladder Cancer Neg Hx   . Prostate cancer Neg Hx     Social History:  reports that he quit smoking about 36 years ago. His smoking use included cigarettes. He has a 10.00 pack-year smoking history. He has never used smokeless tobacco. He reports that he does not drink alcohol and does not use drugs.   Medications Prior to Admission  Medication Sig Dispense Refill  . acyclovir (ZOVIRAX) 400 MG tablet Take 1 tablet (400 mg total) by mouth 2 (two) times daily. 180 tablet 4  . amitriptyline (ELAVIL) 25 MG tablet Take 1 tablet (25 mg total) by mouth at bedtime. 90 tablet 4  . atorvastatin (LIPITOR) 40 MG tablet Take 1 tablet (40 mg total) by mouth at bedtime. 90 tablet 4  . betamethasone dipropionate (DIPROLENE) 0.05 % ointment Apply 1 application topically 2 (two) times daily as needed for rash.    . cetirizine (ZYRTEC) 10 MG tablet Take 10 mg by mouth daily.    . Cholecalciferol (VITAMIN D) 2000 units CAPS Take 2,000 Units by mouth daily.    . Cyanocobalamin (B-12) 5000 MCG CAPS Take 5,000 mcg by mouth daily.    . empagliflozin (JARDIANCE) 10 MG TABS tablet Take 1 tablet (10 mg total) by mouth daily before breakfast. 90 tablet 3  . levETIRAcetam (KEPPRA) 500 MG tablet Take 1 tablet (500 mg total) by mouth 2 (two) times daily. 180 tablet 3  . metFORMIN (GLUCOPHAGE) 1000 MG tablet Take 1 tablet (1,000 mg total) by mouth 2 (two) times daily with a meal. 180 tablet 4  . Multiple Vitamins-Minerals (MULTIVITAMIN PO) Take 1 tablet by mouth daily.     . tamsulosin (FLOMAX) 0.4 MG CAPS capsule Take 1 capsule (0.4 mg total) by mouth at bedtime. 90 capsule 4  . telmisartan (MICARDIS) 80 MG tablet Take 0.5 tablets (40 mg total) by mouth daily. 45 tablet 4  . aspirin EC  81 MG EC tablet Take 1 tablet (81 mg total) by mouth daily. 30 tablet 0  . Lancets (ONETOUCH ULTRASOFT) lancets 1 each by Other route 2 (two) times daily. Dx E11.9 100 each 11  . ONETOUCH ULTRA test strip USE TWICE DAILY 100 each 11  . vitamin B-12 (CYANOCOBALAMIN) 1000 MCG tablet Take 1 tablet (1,000 mcg total) by mouth daily. 90 tablet  4     Physical Exam: Inspection of the left shoulder demonstrates a broad well-healed lateral deltoid splitting incision.  Evidence for chronic atrophy of the supra and infraspinatus.  Painful and guarded range of motion as noted at his recent office visits.  Plain radiographs demonstrate a retained metallic suture anchor in the greater tuberosity with a high riding humeral head and changes consistent with rotator cuff tear arthropathy.  Vitals  Temp:  [97.6 F (36.4 C)] 97.6 F (36.4 C) (02/24 1300) Pulse Rate:  [53-60] 57 (02/24 1327) Resp:  [10-21] 10 (02/24 1414) BP: (134-155)/(70-73) 152/71 (02/24 1412) SpO2:  [97 %-100 %] 100 % (02/24 1412) Weight:  [90.7 kg] 90.7 kg (02/24 1300)  Assessment/Plan  Impression: left shoulder rotator cuff tear arthropathy.  Additionally, patient was evaluated by cardiac electrophysiology this morning and was felt to be appropriate for the planned surgical procedure.  Plan of Action: Procedure(s): REVERSE SHOULDER ARTHROPLASTY  Laurey Salser M Shawnie Nicole 08/30/2020, 2:24 PM Contact # [phone]

## 2020-08-30 NOTE — Evaluation (Signed)
Occupational Therapy Evaluation Patient Details Name: Andrew Cobb MRN: [id number] DOB: December 21, 1950 Today's Date: 08/30/2020    History of Present Illness Patient presents today for scheduled Left reverse shoulder arthroplasty.   Clinical Impression   [name] is a 70 year old man who presents for shoulder surgery.Therapist provided education and instruction to patient in regards to exercises, precautions, positioning, donning upper extremity clothing and bathing while maintaining shoulder precautions, ice cuff and cooler, and proper positioning of sling and wear schedule. Patient verbalized understanding. Patient provided with handouts to maximize retention of all education and instructions. Patient to follow up with MD for further therapy needs.      Follow Up Recommendations  No OT follow up    Equipment Recommendations  None recommended by OT    Recommendations for Other Services       Precautions / Restrictions Precautions Precautions: Shoulder Shoulder Interventions: Shoulder sling/immobilizer;At all times;Off for dressing/bathing/exercises Precaution Comments: If sitting in controlled environment, ok to come out of sling to give neck a break. Please sleep in it to protect until follow up in office.     OK to use operative arm for feeding, hygiene and ADLs.   Ok to instruct Pendulums and lap slides as exercises. Ok to use operative arm within the following parameters for ADL purposes     New ROM (8/18)   Ok for PROM, AAROM, AROM within pain tolerance and within the following ROM   ER 20   ABD 45   FE 60 Restrictions Weight Bearing Restrictions: Yes LUE Weight Bearing: Non weight bearing      Mobility Bed Mobility                    Transfers                      Balance                                           ADL either performed or assessed with clinical judgement   ADL                                                Vision Patient Visual Report: No change from baseline       Perception     Praxis      Pertinent Vitals/Pain Pain Assessment: Faces Faces Pain Scale: Hurts a little bit Pain Location: Left shoulder Pain Intervention(s): Monitored during session     Hand Dominance     Extremity/Trunk Assessment Upper Extremity Assessment Upper Extremity Assessment: LUE deficits/detail LUE Deficits / Details: grossly 90 degrees shoulder flexion, strength 3+/5 strength           Communication     Cognition Arousal/Alertness: Awake/alert Behavior During Therapy: WFL for tasks assessed/performed Overall Cognitive Status: Within Functional Limits for tasks assessed                                     General Comments       Exercises     Shoulder Instructions Shoulder Instructions Donning/doffing shirt without moving shoulder: Patient able to independently direct caregiver Method for  sponge bathing under operated UE: Independent Donning/doffing sling/immobilizer: Independent Correct positioning of sling/immobilizer: Independent;Patient able to independently direct caregiver Pendulum exercises (written home exercise program): Independent ROM for elbow, wrist and digits of operated UE: Independent Sling wearing schedule (on at all times/off for ADL's): Independent Proper positioning of operated UE when showering: Independent Dressing change: Independent Positioning of UE while sleeping: [place]                                          Prior Functioning/Environment                   OT Problem List: Decreased strength;Decreased range of motion;Impaired UE functional use;Pain      OT Treatment/Interventions:      OT Goals(Current goals can be found in the care plan section) Acute Rehab OT Goals OT Goal Formulation: All assessment and education complete, DC therapy  OT Frequency:     Barriers to  D/C:            Co-evaluation              AM-PAC OT "6 Clicks" Daily Activity     Outcome Measure Help from another person eating meals?: None Help from another person taking care of personal grooming?: None Help from another person toileting, which includes using toliet, bedpan, or urinal?: None Help from another person bathing (including washing, rinsing, drying)?: None Help from another person to put on and taking off regular upper body clothing?: None Help from another person to put on and taking off regular lower body clothing?: None 6 Click Score: 24   End of Session Nurse Communication:  (OT education complete)  Activity Tolerance: Patient tolerated treatment well Patient left: in bed  OT Visit Diagnosis: Muscle weakness (generalized) (M62.81);Pain Pain - Right/Left: Left Pain - part of body: Shoulder                Time: 1350-1406 OT Time Calculation (min): 16 min Charges:  OT General Charges $OT Visit: 1 Visit OT Evaluation $OT Eval Low Complexity: 1 Low  Monda Chastain, OTR/L [place] [phone] Pager: [place] 08/30/2020, 3:32 PM

## 2020-09-01 LAB — CUP PACEART REMOTE DEVICE CHECK
Date Time Interrogation Session: 20220223021058
Implantable Pulse Generator Implant Date: 20200316

## 2020-09-03 ENCOUNTER — Ambulatory Visit (INDEPENDENT_AMBULATORY_CARE_PROVIDER_SITE_OTHER): Payer: PPO

## 2020-09-03 ENCOUNTER — Encounter (HOSPITAL_COMMUNITY): Payer: Self-pay | Admitting: Orthopedic Surgery

## 2020-09-03 DIAGNOSIS — I639 Cerebral infarction, unspecified: Secondary | ICD-10-CM | POA: Diagnosis not present

## 2020-09-05 ENCOUNTER — Telehealth: Payer: Self-pay

## 2020-09-10 NOTE — Progress Notes (Signed)
Carelink Summary Report / Loop Recorder 

## 2020-09-11 DIAGNOSIS — Z96612 Presence of left artificial shoulder joint: Secondary | ICD-10-CM | POA: Insufficient documentation

## 2020-09-12 DIAGNOSIS — Z471 Aftercare following joint replacement surgery: Secondary | ICD-10-CM | POA: Diagnosis not present

## 2020-09-12 DIAGNOSIS — Z96612 Presence of left artificial shoulder joint: Secondary | ICD-10-CM | POA: Diagnosis not present

## 2020-09-18 ENCOUNTER — Ambulatory Visit: Payer: PPO | Attending: Orthopedic Surgery | Admitting: Physical Therapy

## 2020-09-18 ENCOUNTER — Encounter: Payer: Self-pay | Admitting: Physical Therapy

## 2020-09-18 ENCOUNTER — Other Ambulatory Visit: Payer: Self-pay

## 2020-09-18 DIAGNOSIS — G8929 Other chronic pain: Secondary | ICD-10-CM | POA: Diagnosis not present

## 2020-09-18 DIAGNOSIS — M25512 Pain in left shoulder: Secondary | ICD-10-CM | POA: Diagnosis not present

## 2020-09-18 DIAGNOSIS — M25612 Stiffness of left shoulder, not elsewhere classified: Secondary | ICD-10-CM

## 2020-09-18 NOTE — Therapy (Signed)
[facility] [address] [name], [address] Phone: [phone]   Fax:  [phone]  Physical Therapy Evaluation  Patient Details  Name: Andrew Cobb MRN: [id number] Date of Birth: 1950/08/14 No data recorded  Encounter Date: 09/18/2020   PT End of Session - 09/18/20 1357    Visit Number 1    Number of Visits 17    Date for PT Re-Evaluation 11/16/20    PT Start Time 0145    PT Stop Time 0230    PT Time Calculation (min) 45 min    Activity Tolerance Patient tolerated treatment well    Behavior During Therapy [facility] for tasks assessed/performed           Past Medical History:  Diagnosis Date  . Arthritis   . Asthma    has not needed since 6/21  . Cancer [facility])    prostate  . Chronic venous insufficiency    varicose vein lower extremity with inflammation  . Coronary artery disease 1996   two stents placed   . Diabetes mellitus without complication ([place])    type 2 on metformin  . GERD (gastroesophageal reflux disease)    no issues since gastric bypass surgery as stated per pt  . Hyperlipidemia   . Hypertension   . Hypogonadism in male   . MRSA (methicillin resistant Staphylococcus aureus) infection    07/30/2008 thru 08/07/2008  abdominal abcess  . Sleep apnea    on BIPAP  . Stented coronary artery   . Stroke ([place]) 09/07/2019   no defecits  . Thrombocythemia     Past Surgical History:  Procedure Laterality Date  . ANGIOPLASTY    . ANGIOPLASTY     with stent 04/07/1995  . ANTERIOR CERVICAL DECOMPRESSION/DISCECTOMY FUSION 4 LEVELS N/A 08/17/2017   Procedure: Anterior discectomy with fusion and plate fixation Cervical Three-Four, Four-Five, Five-Six, and Six-Seven Fusion;  Surgeon: Ditty, Kevan Ny, MD;  Location: [place];  Service: Neurosurgery;  Laterality: N/A;  Anterior discectomy with fusion and plate fixation Cervical Three-Four, Four-Five, Five-Six, and Six-Seven Fusion   .  APPENDECTOMY     1966  . BUBBLE STUDY  09/20/2018   Procedure: BUBBLE STUDY;  Surgeon: Josue Hector, MD;  Location: [place];  Service: Cardiovascular;;  . [place]  . CARDIOVASCULAR STRESS TEST     07/31/2011  . CARPAL TUNNEL RELEASE Left   . CHOLECYSTECTOMY     2006  . colonscopy      08/25/2012  . EYE SURGERY Bilateral    cataract  . FUNCTIONAL ENDOSCOPIC SINUS SURGERY     11/10/2013  . GASTRIC BYPASS     10/05/2012  . HERNIA REPAIR     left inguinal 1981  . INCISION AND DRAINAGE ABSCESS Right 02/26/2017   Procedure: INCISION AND DRAINAGE ABSCESS;  Surgeon: Nickie Retort, MD;  Location: [facility];  Service: Urology;  Laterality: Right;  . JOINT REPLACEMENT     bilateral  . left ankle surgery      05/03/2003   . left carpel tunnel      09/18/1993  . left knee meniscal tear      01/25/2010  . left knee meniscal tear repair      05/04/1996  . left rotator cuff repair      05/03/2003   . LOOP RECORDER INSERTION N/A 09/20/2018   Procedure: LOOP RECORDER INSERTION;  Surgeon: Evans Lance, MD;  Location: [facility] INVASIVE CV  LAB;  Service: Cardiovascular;  Laterality: N/A;  . REPLACEMENT TOTAL KNEE BILATERAL  07/13/2015  . REVERSE SHOULDER ARTHROPLASTY Left 08/30/2020   Procedure: REVERSE SHOULDER ARTHROPLASTY;  Surgeon: Justice Britain, MD;  Location: [facility];  Service: Orthopedics;  Laterality: Left;  163min  . right ankle surgery      fracture has 2 screws 07/07/1997  . right carpel tunnel      05/16/1992  . right shoulder replacement      01/27/2006  . SCROTAL EXPLORATION Right 02/26/2017   Procedure: SCROTUM EXPLORATION;  Surgeon: Nickie Retort, MD;  Location: [facility];  Service: Urology;  Laterality: Right;  . TEE WITHOUT CARDIOVERSION N/A 09/20/2018   Procedure: TRANSESOPHAGEAL ECHOCARDIOGRAM (TEE);  Surgeon: Josue Hector, MD;  Location: [facility] ENDOSCOPY;  Service: Cardiovascular;  Laterality: N/A;  . TOTAL KNEE ARTHROPLASTY Left 07/13/2015    Procedure: LEFT TOTAL KNEE ARTHROPLASTY;  Surgeon: Gaynelle Arabian, MD;  Location: [facility];  Service: Orthopedics;  Laterality: Left;    There were no vitals filed for this visit.     OBJECTIVE  MUSCULOSKELETAL: Tremor: Normal Bulk: Normal Tone: Normal  Cervical Screen AROM: WFL and painless with overpressure in all planes Repeated movement: No centralization or peripheralization with protraction or retraction Hoffman Sign (cervical cord compression): Negative bilat  Elbow Screen Elbow AROM: WNL   Palpation TTP to inferior to incision and pec minor tendon and insertion  Observation: Scar with normal healing, 5.5in, increased scar tissue  Strength R/L 4/2+ Shoulder flexion (anterior deltoid/pec major/coracobrachialis, axillary n. (C5-6) and musculocutaneous n. (C5-7)) 4/2+ Shoulder abduction (deltoid/supraspinatus, axillary/suprascapular n, C5) 3/5 Shoulder external rotation (infraspinatus/teres minor) 4+/5 Shoulder internal rotation (subcapularis/lats/pec major) 5/5 Shoulder extension (posterior deltoid, lats, teres major, axillary/thoracodorsal n.) 5/5 Shoulder horizontal abduction 5/5 Elbow flexion (biceps brachii, brachialis, brachioradialis, musculoskeletal n, C5-6) 5/5 Elbow extension (triceps, radial n, C7) 4-/2- Y lower trap 4/3 T scapular retractors  AROM R/L 151/116 Shoulder flexion 170/92 Shoulder abduction C8/C5Shoulder external rotation L1/L PSIS Shoulder internal rotation 60/60 Shoulder extension *Indicates pain, overpressure performed unless otherwise indicated  PROM R/L 170/139 Shoulder flexion 180/116 Shoulder abduction 77/50 Shoulder external rotation 70/54 Shoulder internal rotation 60/60 Shoulder extension *Indicates pain, overpressure performed unless otherwise indicated  Accessory Motions/Glides Glenohumeral: All guarded  Acromioclavicular:  WNL all directions  Sternoclavicular: WNL all directions   Muscle Length  Testing Pectoralis Major: WNL Pectoralis Minor: Shortened bilat L>R Biceps: R: WNL  NEUROLOGICAL:  Mental Status Patient is oriented to person, place and time.  Recent memory is intact.  Remote memory is intact.  Attention span and concentration are intact.  Expressive speech is intact.  Patient's fund of knowledge is within normal limits for educational level.  Sensation Grossly intact to light touch bilateral UE as determined by testing dermatomes C2-T2 Proprioception and hot/cold testing deferred on this date   Ther-Ex PT reviewed the following HEP with patient with patient able to demonstrate a set of the following with min cuing for correction needed. PT educated patient on parameters of therex (how/when to inc/decrease intensity, frequency, rep/set range, stretch hold time, and purpose of therex) with verbalized understanding.   Access Code: [email or web address] Standing Isometric Shoulder External Rotation with Doorway and Towel Roll - 3 x daily - 7 x weekly - 5 reps - 5-10sec hold Standing Isometric Shoulder Abduction with Doorway - Arm Bent - 3 x daily - 7 x weekly - 5 reps - 5-10sec hold Isometric Shoulder Flexion at Wall - 3 x daily - 7 x weekly - 5  reps - 5-10 hold Seated Shoulder Flexion AAROM with Pulley Behind - 3 x daily - 7 x weekly - 12 reps - 3sec hold Seated Shoulder Abduction AAROM with Pulley Behind - 3 x daily - 7 x weekly - 12 reps - 3sec hold                           Objective measurements completed on examination: See above findings.               PT Education - 09/18/20 1318    Education Details Patient was educated on diagnosis, anatomy and pathology involved, prognosis, role of PT, and was given an HEP, demonstrating exercise with proper form following verbal and tactile cues, and was given a paper hand out to continue exercise at home. Pt was educated on and agreed to plan of care.    Person(s) Educated Patient    Methods  Explanation;Demonstration;Verbal cues;Tactile cues;Handout    Comprehension Verbalized understanding;Returned demonstration;Verbal cues required;Tactile cues required            PT Short Term Goals - 09/18/20 1409      PT SHORT TERM GOAL #1   Title Pt will be independent with HEP in order to improve strength and decrease pain in order to improve pain-free function at home and work.    Baseline 09/18/20 HEP given    Time 4    Period Weeks    Status New             PT Long Term Goals - 09/18/20 1410      PT LONG TERM GOAL #1   Title Patient will increase FOTO score to 58 to demonstrate predicted increase in functional mobility to complete ADLs    Baseline 09/18/20 45    Time 8    Period Weeks    Status New      PT LONG TERM GOAL #2   Title Pt will decrease worst pain as reported on NPRS by at least 3 points in order to demonstrate clinically significant reduction in pain.    Baseline 09/18/20 4/10    Time 8    Period Weeks    Status New      PT LONG TERM GOAL #3   Title Patient will demonstrate overhead abd and flex to at least 40d and IR to at least L2 in order to complete overhead ADLs and ind dressing/bathing    Baseline 09/18/20 fkexL 116 abd 92 IR L PSIS    Time 8    Period Weeks    Status New      PT LONG TERM GOAL #4   Title Pt will demonstrate gross 4/5 L shoulder and periscapular strength in order to complete household ADLs    Baseline 09/18/20                  Plan - 09/18/20 1424    Clinical Impression Statement Pt is a 70 year old male s/p reverse TSA 08/30/20. Current impairments in decreased shoulder ROM, decreased shoulder and periscapular strength, postural deficits, and activity tolerance. Activity limitations in lifting, reaching, dressing, bathing, pushing, and pulling;inhibiting full participation in ADLs and part time job as a Geophysicist/field seismologist. Pt would benefit    Personal Factors and Comorbidities Age;Fitness;Comorbidity 1;Comorbidity 2;Comorbidity  3+;Past/Current Experience;Time since onset of injury/illness/exacerbation;Profession    Comorbidities HTN, DM2, HLD    Examination-Activity Limitations Reach Overhead;Carry;Lift;Bathing;Dressing;Sleep;Transfers    Examination-Participation Restrictions Community Activity;Yard Work;Meal  Prep;Laundry;Driving;Occupation    Stability/Clinical Decision Making Evolving/Moderate complexity    Clinical Decision Making Moderate    Rehab Potential Good    PT Frequency 2x / week    PT Duration 8 weeks    PT Treatment/Interventions Moist Heat;Traction;Dry needling;DME Instruction;Therapeutic exercise;Patient/family education;Manual techniques;Passive range of motion;Joint Manipulations;Spinal Manipulations;ADLs/Self Care Home Management;Cryotherapy;Electrical Stimulation;Ultrasound;Functional mobility training;Neuromuscular re-education;Therapeutic activities;Iontophoresis 4mg /ml Dexamethasone;Contrast Bath;Taping;Scar mobilization    PT Next Visit Plan HEP review, PROM/AAROM    PT Home Exercise Plan pulleys, isometrics    Consulted and Agree with Plan of Care Patient           Patient will benefit from skilled therapeutic intervention in order to improve the following deficits and impairments:  Pain,Postural dysfunction,Impaired UE functional use,Impaired flexibility,Increased fascial restricitons,Decreased strength,Decreased coordination,Decreased activity tolerance,Decreased mobility,Hypomobility,Improper body mechanics,Impaired perceived functional ability,Decreased range of motion,Decreased scar mobility  Visit Diagnosis: No diagnosis found.     Problem List Patient Active Problem List   Diagnosis Date Noted  . Preoperative clearance 08/27/2020  . AKI (acute kidney injury) ([place]) 07/12/2020  . Influenza A 07/12/2020  . Hypovolemia due to dehydration 07/11/2020  . Serous detachment of retinal pigment epithelium of right eye 01/31/2020  . Pseudophakia 01/31/2020  . Exudative age-related  macular degeneration of right eye with active choroidal neovascularization ([place]) 01/31/2020  . Situational depression 10/18/2019  . Seizures ([place]) 10/18/2019  . Hyperlipidemia associated with type 2 diabetes mellitus ([place]) 06/19/2019  . Expressive aphasia 06/19/2019  . Vitamin D deficiency 06/19/2019  . Chronic venous insufficiency   . Asthma   . Arthritis   . GERD (gastroesophageal reflux disease)   . Hypogonadism in male   . History of stroke   . H/O right coronary artery stent placement   . Allergic rhinitis 08/19/2018  . Cervical radiculopathy 08/17/2017  . Advanced care planning/counseling discussion 07/21/2017  . Prostate cancer ([place]) 12/08/2016  . H/O bariatric surgery 07/17/2016  . Hypertension associated with diabetes ([place]) 04/24/2015  . Uncontrolled type 2 diabetes mellitus with hyperglycemia, without long-term current use of insulin ([place]) 04/24/2015  . Chronic sinusitis 08/13/2009  . Coronary atherosclerosis 04/02/2009  . OSA (obstructive sleep apnea) 04/02/2009  . Coronary artery disease 1996   Durwin Reges DPT Durwin Reges 09/18/2020, 5:06 PM  [facility] [address] [name], [address] Phone: [phone]   Fax:  [phone]  Name: Andrew Cobb MRN: [id number] Date of Birth: 09/04/1950

## 2020-09-20 ENCOUNTER — Ambulatory Visit: Payer: PPO | Admitting: Physical Therapy

## 2020-09-20 ENCOUNTER — Other Ambulatory Visit: Payer: Self-pay

## 2020-09-20 ENCOUNTER — Encounter: Payer: Self-pay | Admitting: Physical Therapy

## 2020-09-20 DIAGNOSIS — M25512 Pain in left shoulder: Secondary | ICD-10-CM | POA: Diagnosis not present

## 2020-09-20 DIAGNOSIS — M25612 Stiffness of left shoulder, not elsewhere classified: Secondary | ICD-10-CM

## 2020-09-20 DIAGNOSIS — G8929 Other chronic pain: Secondary | ICD-10-CM

## 2020-09-20 NOTE — Therapy (Signed)
[facility] [address], [name], [address] Phone: [phone]   Fax:  [phone]  Physical Therapy Treatment  Patient Details  Name: Andrew Cobb MRN: [id number] Date of Birth: 1950-08-16 No data recorded  Encounter Date: 09/20/2020   PT End of Session - 09/20/20 1355    Visit Number 2    Number of Visits 17    Date for PT Re-Evaluation 11/16/20    PT Start Time 0100    PT Stop Time 0145    PT Time Calculation (min) 45 min    Activity Tolerance Patient tolerated treatment well           Past Medical History:  Diagnosis Date   Arthritis    Asthma    has not needed since 6/21   Cancer [facility])    prostate   Chronic venous insufficiency    varicose vein lower extremity with inflammation   Coronary artery disease 1996   two stents placed    Diabetes mellitus without complication ([place])    type 2 on metformin   GERD (gastroesophageal reflux disease)    no issues since gastric bypass surgery as stated per pt   Hyperlipidemia    Hypertension    Hypogonadism in male    MRSA (methicillin resistant Staphylococcus aureus) infection    07/30/2008 thru 08/07/2008  abdominal abcess   Sleep apnea    on BIPAP   Stented coronary artery    Stroke ([place]) 09/07/2019   no defecits   Thrombocythemia     Past Surgical History:  Procedure Laterality Date   ANGIOPLASTY     ANGIOPLASTY     with stent 04/07/1995   ANTERIOR CERVICAL DECOMPRESSION/DISCECTOMY FUSION 4 LEVELS N/A 08/17/2017   Procedure: Anterior discectomy with fusion and plate fixation Cervical Three-Four, Four-Five, Five-Six, and Six-Seven Fusion;  Surgeon: Ditty, Kevan Ny, MD;  Location: [place];  Service: Neurosurgery;  Laterality: N/A;  Anterior discectomy with fusion and plate fixation Cervical Three-Four, Four-Five, Five-Six, and Six-Seven Fusion    APPENDECTOMY     1966   BUBBLE STUDY  09/20/2018   Procedure: BUBBLE  STUDY;  Surgeon: Josue Hector, MD;  Location: [facility] ENDOSCOPY;  Service: Cardiovascular;;   [place] TEST     07/31/2011   CARPAL TUNNEL RELEASE Left    CHOLECYSTECTOMY     2006   colonscopy      08/25/2012   EYE SURGERY Bilateral    cataract   FUNCTIONAL ENDOSCOPIC SINUS SURGERY     11/10/2013   GASTRIC BYPASS     10/05/2012   HERNIA REPAIR     left inguinal 1981   INCISION AND DRAINAGE ABSCESS Right 02/26/2017   Procedure: INCISION AND DRAINAGE ABSCESS;  Surgeon: Nickie Retort, MD;  Location: [facility] ORS;  Service: Urology;  Laterality: Right;   JOINT REPLACEMENT     bilateral   left ankle surgery      05/03/2003    left carpel tunnel      09/18/1993   left knee meniscal tear      01/25/2010   left knee meniscal tear repair      05/04/1996   left rotator cuff repair      05/03/2003    LOOP RECORDER INSERTION N/A 09/20/2018   Procedure: LOOP RECORDER INSERTION;  Surgeon: Evans Lance, MD;  Location: [place];  Service: Cardiovascular;  Laterality: N/A;   REPLACEMENT  TOTAL KNEE BILATERAL  07/13/2015   REVERSE SHOULDER ARTHROPLASTY Left 08/30/2020   Procedure: REVERSE SHOULDER ARTHROPLASTY;  Surgeon: Justice Britain, MD;  Location: [facility] ORS;  Service: Orthopedics;  Laterality: Left;  16min   right ankle surgery      fracture has 2 screws 07/07/1997   right carpel tunnel      05/16/1992   right shoulder replacement      01/27/2006   SCROTAL EXPLORATION Right 02/26/2017   Procedure: SCROTUM EXPLORATION;  Surgeon: Nickie Retort, MD;  Location: [facility] ORS;  Service: Urology;  Laterality: Right;   TEE WITHOUT CARDIOVERSION N/A 09/20/2018   Procedure: TRANSESOPHAGEAL ECHOCARDIOGRAM (TEE);  Surgeon: Josue Hector, MD;  Location: [facility] ENDOSCOPY;  Service: Cardiovascular;  Laterality: N/A;   TOTAL KNEE ARTHROPLASTY Left 07/13/2015   Procedure: LEFT TOTAL KNEE ARTHROPLASTY;  Surgeon: Gaynelle Arabian, MD;   Location: [facility] ORS;  Service: Orthopedics;  Laterality: Left;    There were no vitals filed for this visit.   Subjective Assessment - 09/20/20 1258    Subjective Pt reports 4/10 pain at the beginning of the session. Pt reports that he may have overdone it with his HEP. He performed the exercises 3 times yesterday. Pt used ice machinefor 5 hours to help with pain and reported decrease in pain with ice use. Patient reports pain still in anterior shoulder. Patient reports using vaseline to massage the incisions site. Patient demosntrated exercises but needing cueing to correct form.  preferred name - Andrew Cobb    Pertinent History Pt is a 70 year male presenting s/p reverse TSA 08/30/20. Patient is left handed. Wore sling for 1 week, and has been out of sling the past few weeks and is completing pendulum exercise and trying to move his shoulder as much as possible. Has follow up scheduled 10/08/20. Reports normal incision site with no drainage or redness. 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 He goes to the [facility] and does water aerobics 5x/day. He has 6 small grandkids that he enjoys visiting. He is able to complete ADLs modI, has shower seat. Pain is limited without medication worst 4/10; best: 2/10; currently 3/10. Pain is anterior and reports is mostly when he sits for too long. Pt denies N/V, B&B changes, unexplained weight fluctuation, saddle paresthesia, fever, night sweats, or unrelenting night pain at this time.    Limitations Writing;House hold activities;Lifting;Reading    How long can you sit comfortably? unlimited    How long can you stand comfortably? unlimited    How long can you walk comfortably? unlimited    Diagnostic tests Xrays post    Patient Stated Goals Get back to water aerobic            Manual: 10 min  PROM - flex, ABD, ER, IR  G1 post glide  w/PROM STM scar tissue massage to incision - TTP still  TherEx:  warmup: pulley flexion and ABD: 5 minutes  Reviewed HEP with patient - needed cueing for flexion and ABD to keep first or arm on the Kendre Jacinto  Ronit Marczak climbs 2 sets of 8 holding for 5 seconds at the top - medium - painful, patient required cueing to reach until he felt a stretch and hold before walking back down   AAROM ABD with pipe 2 sets of 10 - medium because of pain- patient required cueing to only return  to neutral  AAROM flex with pipe 2 sets of 10 - medium - patient required cueing to keep elbows straight and move hands closer together  scap retraction rows - yellow theraband 2 sets of 10 - medium but less painful than the ROM exercises- patient required cueing to pinch shoulder blades together and keep elbows close to side.    Educated patient about proper form for HEP and to continue with current HEP and adding scapular retraction rows.                              PT Education - 09/20/20 1350    Education Details Patient was educated on proper HEP form and the imporance of ice to reduce pain. Patient was given verbal and tactile cues to perform exercises correctly and demosntrated proper form. Patient aggreed to plan of care and to continue with HEP.    Person(s) Educated Patient    Methods Explanation;Demonstration;Tactile cues;Verbal cues    Comprehension Verbalized understanding;Returned demonstration            PT Short Term Goals - 09/18/20 1409      PT SHORT TERM GOAL #1   Title Pt will be independent with HEP in order to improve strength and decrease pain in order to improve pain-free function at home and work.    Baseline 09/18/20 HEP given    Time 4    Period Weeks    Status New             PT Long Term Goals - 09/18/20 1410      PT LONG TERM GOAL #1   Title Patient will increase FOTO score to 58 to demonstrate predicted increase in functional mobility to complete ADLs     Baseline 09/18/20 45    Time 8    Period Weeks    Status New      PT LONG TERM GOAL #2   Title Pt will decrease worst pain as reported on NPRS by at least 3 points in order to demonstrate clinically significant reduction in pain.    Baseline 09/18/20 4/10    Time 8    Period Weeks    Status New      PT LONG TERM GOAL #3   Title Patient will demonstrate overhead abd and flex to at least 40d and IR to at least L2 in order to complete overhead ADLs and ind dressing/bathing    Baseline 09/18/20 fkexL 116 abd 92 IR L PSIS    Time 8    Period Weeks    Status New      PT LONG TERM GOAL #4   Title Pt will demonstrate gross 4/5 L shoulder and periscapular strength in order to complete household ADLs    Baseline 09/18/20                 Plan - 09/20/20 1355    Clinical Impression Statement PT led patient through therex progression for increased mobility with manual techniques with success. Patient demonstrates good carry over of ultimodal cuing for proepr technique of therex, with no increased pain throughout session, good motivation. Patient demonstrates increased PROM at end of session. PT reviewed HEP with patient with many corrections needed, demonstrated and verbalized understanding. PT will continue progression as able.    Personal Factors and Comorbidities Age;Fitness;Comorbidity 1;Comorbidity 2;Comorbidity 3+;Past/Current Experience;Time since onset of injury/illness/exacerbation;Profession    Comorbidities HTN, DM2, HLD  Examination-Activity Limitations Reach Overhead;Carry;Lift;Bathing;Dressing;Sleep;Transfers    Examination-Participation Restrictions Community Activity;Yard Work;Meal Prep;Laundry;Driving;Occupation    Stability/Clinical Decision Making Evolving/Moderate complexity    Clinical Decision Making Moderate    Rehab Potential Good    PT Frequency 2x / week    PT Duration 8 weeks    PT Treatment/Interventions Moist Heat;Traction;Dry needling;DME  Instruction;Therapeutic exercise;Patient/family education;Manual techniques;Passive range of motion;Joint Manipulations;Spinal Manipulations;ADLs/Self Care Home Management;Cryotherapy;Electrical Stimulation;Ultrasound;Functional mobility training;Neuromuscular re-education;Therapeutic activities;Iontophoresis 4mg /ml Dexamethasone;Contrast Bath;Taping;Scar mobilization    PT Next Visit Plan HEP review, continue PROM with mobilizations, AAROM with pole - progress by adding weight, progress scapular exercises, deltoid strenghtening    PT Home Exercise Plan pulleys, isometrics, scapular retraction row- yellow theraband    Consulted and Agree with Plan of Care Patient           Patient will benefit from skilled therapeutic intervention in order to improve the following deficits and impairments:  Pain,Postural dysfunction,Impaired UE functional use,Impaired flexibility,Increased fascial restricitons,Decreased strength,Decreased coordination,Decreased activity tolerance,Decreased mobility,Hypomobility,Improper body mechanics,Impaired perceived functional ability,Decreased range of motion,Decreased scar mobility  Visit Diagnosis: Chronic left shoulder pain  Stiffness of left shoulder, not elsewhere classified     Problem List Patient Active Problem List   Diagnosis Date Noted   Preoperative clearance 08/27/2020   AKI (acute kidney injury) ([place]) 07/12/2020   Influenza A 07/12/2020   Hypovolemia due to dehydration 07/11/2020   Serous detachment of retinal pigment epithelium of right eye 01/31/2020   Pseudophakia 01/31/2020   Exudative age-related macular degeneration of right eye with active choroidal neovascularization ([place]) 01/31/2020   Situational depression 10/18/2019   Seizures ([place]) 10/18/2019   Hyperlipidemia associated with type 2 diabetes mellitus ([place]) 06/19/2019   Expressive aphasia 06/19/2019   Vitamin D deficiency 06/19/2019   Chronic venous insufficiency    Asthma     Arthritis    GERD (gastroesophageal reflux disease)    Hypogonadism in male    History of stroke    H/O right coronary artery stent placement    Allergic rhinitis 08/19/2018   Cervical radiculopathy 08/17/2017   Advanced care planning/counseling discussion 07/21/2017   Prostate cancer ([place]) 12/08/2016   H/O bariatric surgery 07/17/2016   Hypertension associated with diabetes ([place]) 04/24/2015   Uncontrolled type 2 diabetes mellitus with hyperglycemia, without long-term current use of insulin ([place]) 04/24/2015   Chronic sinusitis 08/13/2009   Coronary atherosclerosis 04/02/2009   OSA (obstructive sleep apnea) 04/02/2009   Coronary artery disease Ama DPT Durwin Reges 09/20/2020, 2:20 PM   [facility] [address] [name], [address] Phone: [phone]   Fax:  [phone]  Name: Andrew Cobb MRN: [id number] Date of Birth: [birth year]

## 2020-09-24 ENCOUNTER — Ambulatory Visit: Payer: PPO | Admitting: Physical Therapy

## 2020-09-24 ENCOUNTER — Encounter: Payer: Self-pay | Admitting: Physical Therapy

## 2020-09-24 ENCOUNTER — Other Ambulatory Visit: Payer: Self-pay

## 2020-09-24 DIAGNOSIS — M25512 Pain in left shoulder: Secondary | ICD-10-CM | POA: Diagnosis not present

## 2020-09-24 NOTE — Therapy (Signed)
[facility] [address] [name], [address] Phone: [phone]   Fax:  [phone]  Physical Therapy Treatment  Patient Details  Name: Andrew Cobb MRN: [id number] Date of Birth: [birth year] No data recorded  Encounter Date: 09/24/2020   PT End of Session - 09/24/20 1344    Visit Number 3    Number of Visits 17    Date for PT Re-Evaluation 11/16/20    PT Start Time 0104    PT Stop Time 0144    PT Time Calculation (min) 40 min    Activity Tolerance Patient tolerated treatment well    Behavior During Therapy [facility] for tasks assessed/performed           Past Medical History:  Diagnosis Date  . Arthritis   . Asthma    has not needed since 6/21  . Cancer [facility])    prostate  . Chronic venous insufficiency    varicose vein lower extremity with inflammation  . Coronary artery disease 1996   two stents placed   . Diabetes mellitus without complication ([place])    type 2 on metformin  . GERD (gastroesophageal reflux disease)    no issues since gastric bypass surgery as stated per pt  . Hyperlipidemia   . Hypertension   . Hypogonadism in male   . MRSA (methicillin resistant Staphylococcus aureus) infection    07/30/2008 thru 08/07/2008  abdominal abcess  . Sleep apnea    on BIPAP  . Stented coronary artery   . Stroke ([place]) 09/07/2019   no defecits  . Thrombocythemia     Past Surgical History:  Procedure Laterality Date  . ANGIOPLASTY    . ANGIOPLASTY     with stent 04/07/1995  . ANTERIOR CERVICAL DECOMPRESSION/DISCECTOMY FUSION 4 LEVELS N/A 08/17/2017   Procedure: Anterior discectomy with fusion and plate fixation Cervical Three-Four, Four-Five, Five-Six, and Six-Seven Fusion;  Surgeon: Ditty, Kevan Ny, MD;  Location: [place];  Service: Neurosurgery;  Laterality: N/A;  Anterior discectomy with fusion and plate fixation Cervical Three-Four, Four-Five, Five-Six, and Six-Seven Fusion   . APPENDECTOMY      1966  . BUBBLE STUDY  09/20/2018   Procedure: BUBBLE STUDY;  Surgeon: Josue Hector, MD;  Location: [place];  Service: Cardiovascular;;  . [place]  . CARDIOVASCULAR STRESS TEST     07/31/2011  . CARPAL TUNNEL RELEASE Left   . CHOLECYSTECTOMY     2006  . colonscopy      08/25/2012  . EYE SURGERY Bilateral    cataract  . FUNCTIONAL ENDOSCOPIC SINUS SURGERY     11/10/2013  . GASTRIC BYPASS     10/05/2012  . HERNIA REPAIR     left inguinal 1981  . INCISION AND DRAINAGE ABSCESS Right 02/26/2017   Procedure: INCISION AND DRAINAGE ABSCESS;  Surgeon: Nickie Retort, MD;  Location: [facility];  Service: Urology;  Laterality: Right;  . JOINT REPLACEMENT     bilateral  . left ankle surgery      05/03/2003   . left carpel tunnel      09/18/1993  . left knee meniscal tear      01/25/2010  . left knee meniscal tear repair      05/04/1996  . left rotator cuff repair      05/03/2003   . LOOP RECORDER INSERTION N/A 09/20/2018   Procedure: LOOP RECORDER INSERTION;  Surgeon: Evans Lance, MD;  Location: [facility] INVASIVE CV  LAB;  Service: Cardiovascular;  Laterality: N/A;  . REPLACEMENT TOTAL KNEE BILATERAL  07/13/2015  . REVERSE SHOULDER ARTHROPLASTY Left 08/30/2020   Procedure: REVERSE SHOULDER ARTHROPLASTY;  Surgeon: Justice Britain, MD;  Location: [facility];  Service: Orthopedics;  Laterality: Left;  180min  . right ankle surgery      fracture has 2 screws 07/07/1997  . right carpel tunnel      05/16/1992  . right shoulder replacement      01/27/2006  . SCROTAL EXPLORATION Right 02/26/2017   Procedure: SCROTUM EXPLORATION;  Surgeon: Nickie Retort, MD;  Location: [facility];  Service: Urology;  Laterality: Right;  . TEE WITHOUT CARDIOVERSION N/A 09/20/2018   Procedure: TRANSESOPHAGEAL ECHOCARDIOGRAM (TEE);  Surgeon: Josue Hector, MD;  Location: [facility] ENDOSCOPY;  Service: Cardiovascular;  Laterality: N/A;  . TOTAL KNEE ARTHROPLASTY Left 07/13/2015   Procedure: LEFT  TOTAL KNEE ARTHROPLASTY;  Surgeon: Gaynelle Arabian, MD;  Location: [facility];  Service: Orthopedics;  Laterality: Left;    There were no vitals filed for this visit.   Subjective Assessment - 09/24/20 1305    Subjective Pt reports 0/10 pain and feels less stiffness.Pt reports exercises have been going well with no increased pain. patient continues to use ice daily to decrease pain. Patient continues to massage the incisision and reports no pain around the incision.    Pertinent History Pt is a 70 year male presenting s/p reverse TSA 08/30/20. Patient is left handed. Wore sling for 1 week, and has been out of sling the past few weeks and is completing pendulum exercise and trying to move his shoulder as much as possible. Has follow up scheduled 10/08/20. Reports normal incision site with no drainage or redness. 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 He goes to the [facility] and does water aerobics 5x/day. He has 6 small grandkids that he enjoys visiting. He is able to complete ADLs modI, has shower seat. Pain is limited without medication worst 4/10; best: 2/10; currently 3/10. Pain is anterior and reports is mostly when he sits for too long. Pt denies N/V, B&B changes, unexplained weight fluctuation, saddle paresthesia, fever, night sweats, or unrelenting night pain at this time.    Limitations Writing;House hold activities;Lifting;Reading    How long can you sit comfortably? unlimited    How long can you stand comfortably? unlimited    How long can you walk comfortably? unlimited    Diagnostic tests Xrays post    Patient Stated Goals Get back to water aerobic    Currently in Pain? No/denies    Pain Score 0-No pain    Pain Onset 1 to 4 weeks ago         Manual: 10 min  PROM - flex, ABD, ER, IR  G1 post glide with IR and ER  G3 inf glide with flexion and ABD  STM scar  tissue massage to incision - TTP in certain spots especially below incision   TherEx:  warmup: pulley flexion and ABD: 5 minutes, cueing to hold at the top Wall climbs 1 set of 10 without weight, 2 sets of 10 with 1# weight around wrist, medium because of stiffnes  reviewed HEP - cueing needed for proper technique for ABD isometric to face forward and push forearm into the wall  AAROM flex with pipe 1 sets of 15 with 1# weight, 2 sets  of 10 with 2# weight- medium - patient required cueing to keep elbows straight- medium difficult due to stiffness  Front delt raises with 1# weight  - 2 sets of 10- medium difficulty  Lateral delt raises with 1# weight  - 2 sets of 10 - medium difficutly  scapular pinches with YTB - 2 sets of 15- cueing to perform motion slow and controlled Educated patient about proper form for HEP and to continue with current HEP .                               PT Education - 09/24/20 1440    Education Details HEP form/technique    Person(s) Educated Patient    Methods Explanation;Demonstration;Tactile cues;Verbal cues    Comprehension Verbalized understanding;Returned demonstration;Verbal cues required            PT Short Term Goals - 09/18/20 1409      PT SHORT TERM GOAL #1   Title Pt will be independent with HEP in order to improve strength and decrease pain in order to improve pain-free function at home and work.    Baseline 09/18/20 HEP given    Time 4    Period Weeks    Status New             PT Long Term Goals - 09/18/20 1410      PT LONG TERM GOAL #1   Title Patient will increase FOTO score to 58 to demonstrate predicted increase in functional mobility to complete ADLs    Baseline 09/18/20 45    Time 8    Period Weeks    Status New      PT LONG TERM GOAL #2   Title Pt will decrease worst pain as reported on NPRS by at least 3 points in order to demonstrate clinically significant reduction in pain.    Baseline 09/18/20  4/10    Time 8    Period Weeks    Status New      PT LONG TERM GOAL #3   Title Patient will demonstrate overhead abd and flex to at least 40d and IR to at least L2 in order to complete overhead ADLs and ind dressing/bathing    Baseline 09/18/20 fkexL 116 abd 92 IR L PSIS    Time 8    Period Weeks    Status New      PT LONG TERM GOAL #4   Title Pt will demonstrate gross 4/5 L shoulder and periscapular strength in order to complete household ADLs    Baseline 09/18/20                 Plan - 09/24/20 1441    Clinical Impression Statement PT led patient through therex progression which was well tolerated by patient. Patient responded well to mobilizations to increase shoulder mobility and exercise progressions to increase strenght. Patient demonstrates proper technique with no increased pain throughout session. Patient is motivated and will continue current HEP to increase shoulder strenth and mobility. PT will continue progressions to pateint tolerance.    Personal Factors and Comorbidities Age;Fitness;Comorbidity 1;Comorbidity 2;Comorbidity 3+;Past/Current Experience;Time since onset of injury/illness/exacerbation;Profession    Comorbidities HTN, DM2, HLD    Examination-Activity Limitations Reach Overhead;Carry;Lift;Bathing;Dressing;Sleep;Transfers    Examination-Participation Restrictions Community Activity;Yard Work;Meal Prep;Laundry;Driving;Occupation    Stability/Clinical Decision Making Evolving/Moderate complexity    Clinical Decision Making Moderate    Rehab Potential Good    PT Frequency 2x /  week    PT Duration 8 weeks    PT Treatment/Interventions Moist Heat;Traction;Dry needling;DME Instruction;Therapeutic exercise;Patient/family education;Manual techniques;Passive range of motion;Joint Manipulations;Spinal Manipulations;ADLs/Self Care Home Management;Cryotherapy;Electrical Stimulation;Ultrasound;Functional mobility training;Neuromuscular re-education;Therapeutic  activities;Iontophoresis 4mg /ml Dexamethasone;Contrast Bath;Taping;Scar mobilization    PT Next Visit Plan HEP review, continue PROM with mobilizations, continue strength exercises    PT Home Exercise Plan pulleys, isometrics, scapular retraction row- yellow theraband    Consulted and Agree with Plan of Care Patient           Patient will benefit from skilled therapeutic intervention in order to improve the following deficits and impairments:  Pain,Postural dysfunction,Impaired UE functional use,Impaired flexibility,Increased fascial restricitons,Decreased strength,Decreased coordination,Decreased activity tolerance,Decreased mobility,Hypomobility,Improper body mechanics,Impaired perceived functional ability,Decreased range of motion,Decreased scar mobility  Visit Diagnosis: Acute pain of left shoulder     Problem List Patient Active Problem List   Diagnosis Date Noted  . Preoperative clearance 08/27/2020  . AKI (acute kidney injury) ([place]) 07/12/2020  . Influenza A 07/12/2020  . Hypovolemia due to dehydration 07/11/2020  . Serous detachment of retinal pigment epithelium of right eye 01/31/2020  . Pseudophakia 01/31/2020  . Exudative age-related macular degeneration of right eye with active choroidal neovascularization ([place]) 01/31/2020  . Situational depression 10/18/2019  . Seizures ([place]) 10/18/2019  . Hyperlipidemia associated with type 2 diabetes mellitus ([place]) 06/19/2019  . Expressive aphasia 06/19/2019  . Vitamin D deficiency 06/19/2019  . Chronic venous insufficiency   . Asthma   . Arthritis   . GERD (gastroesophageal reflux disease)   . Hypogonadism in male   . History of stroke   . H/O right coronary artery stent placement   . Allergic rhinitis 08/19/2018  . Cervical radiculopathy 08/17/2017  . Advanced care planning/counseling discussion 07/21/2017  . Prostate cancer ([place]) 12/08/2016  . H/O bariatric surgery 07/17/2016  . Hypertension associated with diabetes ([place])  04/24/2015  . Uncontrolled type 2 diabetes mellitus with hyperglycemia, without long-term current use of insulin ([place]) 04/24/2015  . Chronic sinusitis 08/13/2009  . Coronary atherosclerosis 04/02/2009  . OSA (obstructive sleep apnea) 04/02/2009  . Coronary artery disease 1996    Durwin Reges DPT [address], SPT  Durwin Reges 09/24/2020, 4:52 PM  [facility] [address] [name], [address] Phone: [phone]   Fax:  [phone]  Name: CARLOS HEBER MRN: [id number] Date of Birth: 1951/05/05

## 2020-09-25 ENCOUNTER — Encounter (INDEPENDENT_AMBULATORY_CARE_PROVIDER_SITE_OTHER): Payer: Self-pay | Admitting: Ophthalmology

## 2020-09-25 ENCOUNTER — Ambulatory Visit (INDEPENDENT_AMBULATORY_CARE_PROVIDER_SITE_OTHER): Payer: PPO | Admitting: Ophthalmology

## 2020-09-25 DIAGNOSIS — H353211 Exudative age-related macular degeneration, right eye, with active choroidal neovascularization: Secondary | ICD-10-CM | POA: Diagnosis not present

## 2020-09-25 MED ORDER — BEVACIZUMAB 2.5 MG/0.1ML IZ SOSY
2.5000 mg | PREFILLED_SYRINGE | INTRAVITREAL | Status: AC | PRN
  Administered 2020-09-25: 2.5 mg via INTRAVITREAL

## 2020-09-25 NOTE — Assessment & Plan Note (Signed)
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  Forms of therapy reviewed include the use of Anti-VEGF medications injected painlessly into the eye, as well as other possible treatment modalities, including thermal laser therapy. Fellow eye involvement and risks were discussed with the patient. Upon the finding of wet age related macular degeneration, treatment will be offered. The treatment regimen is on a treat as needed basis with the intent to treat if necessary and extend interval of exams when possible. On average 1 out of 6 patients do not need lifetime therapy. However, the risk of recurrent disease is high for a lifetime.  Initially monthly, then periodic, examinations and evaluations will determine whether the next treatment is required on the day of the examination.  OD vastly improved overall yet still with subretinal fluid from a vascularized pigment epithelial detachment currently at 7-week interval.  We will have been delayed due to recent shoulder surgery he is now 3 weeks post surgery  Today OD at 7 week interval, small change with increased serous retinal detachment adjacent to the vascularized pigment epithelial detachment nasal to the FAZ.  We will repeat injection today Avastin and examination again in 5 weeks

## 2020-09-25 NOTE — Progress Notes (Signed)
09/25/2020     CHIEF COMPLAINT Patient presents for Retina Follow Up (7 Wk F/U OD, poss Avastin OD//Pt denies noticeable changes to [place] OU since last visit. Pt denies ocular pain, flashes of light, or floaters OU. //)   HISTORY OF PRESENT ILLNESS: Andrew Cobb is a 70 y.o. male who presents to the clinic today for:   HPI    Retina Follow Up    Patient presents with  Wet AMD.  In right eye.  This started 7 weeks ago.  Severity is mild.  Duration of 7 weeks.  Since onset it is stable. Additional comments: 7 Wk F/U OD, poss Avastin OD  Pt denies noticeable changes to [place] OU since last visit. Pt denies ocular pain, flashes of light, or floaters OU.          Last edited by Rockie Neighbours, [place] on 09/25/2020  9:47 AM. (History)      Referring physician: [name], NP [place],  [place] [id number]  HISTORICAL INFORMATION:   Selected notes from the MEDICAL RECORD NUMBER    Lab Results  Component Value Date   HGBA1C 6.7 08/27/2020     CURRENT MEDICATIONS: No current outpatient medications on file. (Ophthalmic Drugs)   No current facility-administered medications for this visit. (Ophthalmic Drugs)   Current Outpatient Medications (Other)  Medication Sig  . acyclovir (ZOVIRAX) 400 MG tablet Take 1 tablet (400 mg total) by mouth 2 (two) times daily.  [name] Kitchen amitriptyline (ELAVIL) 25 MG tablet Take 1 tablet (25 mg total) by mouth at bedtime.  [name] Kitchen aspirin EC 81 MG EC tablet Take 1 tablet (81 mg total) by mouth daily.  [name] Kitchen atorvastatin (LIPITOR) 40 MG tablet Take 1 tablet (40 mg total) by mouth at bedtime.  . betamethasone dipropionate (DIPROLENE) 0.05 % ointment Apply 1 application topically 2 (two) times daily as needed for rash.  . cetirizine (ZYRTEC) 10 MG tablet Take 10 mg by mouth daily.  . Cholecalciferol (VITAMIN D) 2000 units CAPS Take 2,000 Units by mouth daily.  . Cyanocobalamin (B-12) 5000 MCG CAPS Take 5,000 mcg by mouth daily.  . cyclobenzaprine (FLEXERIL) 10  MG tablet Take 1 tablet (10 mg total) by mouth 3 (three) times daily as needed for muscle spasms.  . empagliflozin (JARDIANCE) 10 MG TABS tablet Take 1 tablet (10 mg total) by mouth daily before breakfast.  . Lancets (ONETOUCH ULTRASOFT) lancets 1 each by Other route 2 (two) times daily. Dx E11.9  . levETIRAcetam (KEPPRA) 500 MG tablet Take 1 tablet (500 mg total) by mouth 2 (two) times daily.  . metFORMIN (GLUCOPHAGE) 1000 MG tablet Take 1 tablet (1,000 mg total) by mouth 2 (two) times daily with a meal.  . Multiple Vitamins-Minerals (MULTIVITAMIN PO) Take 1 tablet by mouth daily.   . ondansetron (ZOFRAN) 4 MG tablet Take 1 tablet (4 mg total) by mouth every 8 (eight) hours as needed for nausea or vomiting.  [name] ULTRA test strip USE TWICE DAILY  . oxyCODONE-acetaminophen (PERCOCET) 5-325 MG tablet Take 1 tablet by mouth every 4 (four) hours as needed (max 6 q).  . tamsulosin (FLOMAX) 0.4 MG CAPS capsule Take 1 capsule (0.4 mg total) by mouth at bedtime.  [name] Kitchen telmisartan (MICARDIS) 80 MG tablet Take 0.5 tablets (40 mg total) by mouth daily.   No current facility-administered medications for this visit. (Other)      REVIEW OF SYSTEMS:    ALLERGIES Allergies  Allergen Reactions  . Succinylsulphathiazole Rash  .  Sulfamethoxazole-Trimethoprim Rash  . Tetracyclines & Related Rash    PAST MEDICAL HISTORY Past Medical History:  Diagnosis Date  . Arthritis   . Asthma    has not needed since 6/21  . Cancer [facility])    prostate  . Chronic venous insufficiency    varicose vein lower extremity with inflammation  . Coronary artery disease 1996   two stents placed   . Diabetes mellitus without complication ([place])    type 2 on metformin  . GERD (gastroesophageal reflux disease)    no issues since gastric bypass surgery as stated per pt  . Hyperlipidemia   . Hypertension   . Hypogonadism in male   . MRSA (methicillin resistant Staphylococcus aureus) infection    07/30/2008 thru  08/07/2008  abdominal abcess  . Sleep apnea    on BIPAP  . Stented coronary artery   . Stroke ([place]) 09/07/2019   no defecits  . Thrombocythemia    Past Surgical History:  Procedure Laterality Date  . ANGIOPLASTY    . ANGIOPLASTY     with stent 04/07/1995  . ANTERIOR CERVICAL DECOMPRESSION/DISCECTOMY FUSION 4 LEVELS N/A 08/17/2017   Procedure: Anterior discectomy with fusion and plate fixation Cervical Three-Four, Four-Five, Five-Six, and Six-Seven Fusion;  Surgeon: Ditty, Kevan Ny, MD;  Location: [place];  Service: Neurosurgery;  Laterality: N/A;  Anterior discectomy with fusion and plate fixation Cervical Three-Four, Four-Five, Five-Six, and Six-Seven Fusion   . APPENDECTOMY     1966  . BUBBLE STUDY  09/20/2018   Procedure: BUBBLE STUDY;  Surgeon: Josue Hector, MD;  Location: [place];  Service: Cardiovascular;;  . Kim  . CARDIOVASCULAR STRESS TEST     07/31/2011  . CARPAL TUNNEL RELEASE Left   . CHOLECYSTECTOMY     2006  . colonscopy      08/25/2012  . EYE SURGERY Bilateral    cataract  . FUNCTIONAL ENDOSCOPIC SINUS SURGERY     11/10/2013  . GASTRIC BYPASS     10/05/2012  . HERNIA REPAIR     left inguinal 1981  . INCISION AND DRAINAGE ABSCESS Right 02/26/2017   Procedure: INCISION AND DRAINAGE ABSCESS;  Surgeon: Nickie Retort, MD;  Location: [facility];  Service: Urology;  Laterality: Right;  . JOINT REPLACEMENT     bilateral  . left ankle surgery      05/03/2003   . left carpel tunnel      09/18/1993  . left knee meniscal tear      01/25/2010  . left knee meniscal tear repair      05/04/1996  . left rotator cuff repair      05/03/2003   . LOOP RECORDER INSERTION N/A 09/20/2018   Procedure: LOOP RECORDER INSERTION;  Surgeon: Evans Lance, MD;  Location: [place] [facility] [facility];  Service: Cardiovascular;  Laterality: N/A;  . REPLACEMENT TOTAL KNEE BILATERAL  07/13/2015  . REVERSE SHOULDER ARTHROPLASTY Left 08/30/2020   Procedure: REVERSE  SHOULDER ARTHROPLASTY;  Surgeon: Justice Britain, MD;  Location: [facility];  Service: Orthopedics;  Laterality: Left;  140min  . right ankle surgery      fracture has 2 screws 07/07/1997  . right carpel tunnel      05/16/1992  . right shoulder replacement      01/27/2006  . SCROTAL EXPLORATION Right 02/26/2017   Procedure: SCROTUM EXPLORATION;  Surgeon: Nickie Retort, MD;  Location: [facility];  Service: Urology;  Laterality: Right;  . TEE WITHOUT CARDIOVERSION N/A 09/20/2018  Procedure: TRANSESOPHAGEAL ECHOCARDIOGRAM (TEE);  Surgeon: Josue Hector, MD;  Location: [facility];  Service: Cardiovascular;  Laterality: N/A;  . TOTAL KNEE ARTHROPLASTY Left 07/13/2015   Procedure: LEFT TOTAL KNEE ARTHROPLASTY;  Surgeon: Gaynelle Arabian, MD;  Location: [facility];  Service: Orthopedics;  Laterality: Left;    FAMILY HISTORY Family History  Problem Relation Age of Onset  . Cancer Mother        pancreatic  . Diabetes Mother   . Stroke Mother   . Heart disease Mother   . Hyperlipidemia Mother   . Hypertension Mother   . Heart attack Mother   . Heart disease Father   . Stroke Father   . Diabetes Father   . Hypertension Father   . Heart attack Father   . Hyperlipidemia Father   . Pancreatic cancer Father   . Cancer Sister   . Cancer Brother        lung  . Cancer Brother   . Kidney cancer Neg Hx   . Bladder Cancer Neg Hx   . Prostate cancer Neg Hx     SOCIAL HISTORY Social History   Tobacco Use  . Smoking status: Former Smoker    Packs/day: 1.00    Years: 10.00    Pack years: 10.00    Types: Cigarettes    Quit date: 07/07/1984    Years since quitting: 36.2  . Smokeless tobacco: Never Used  . Tobacco comment: quit 1986  Vaping Use  . Vaping Use: Never used  Substance Use Topics  . Alcohol use: No    Alcohol/week: 0.0 standard drinks  . Drug use: No         OPHTHALMIC EXAM: Base Eye Exam    Visual Acuity (ETDRS)      Right Left   Dist [place] 20/30 -2 20/20 -1   Dist ph [place] 20/25  -2        Tonometry (Tonopen, 9:47 AM)      Right Left   Pressure 14 13       Pupils      Pupils Dark Light Shape React APD   Right PERRL 4 3 Round Brisk None   Left PERRL 4 3 Round Brisk None       Visual [place] (Counting fingers)      Left Right    Full Full       Extraocular Movement      Right Left    Full Full       Neuro/Psych    Oriented x3: Yes   Mood/Affect: Normal       Dilation    Right eye: 1.0% Mydriacyl, 2.5% Phenylephrine @ 9:50 AM        Slit Lamp and Fundus Exam    External Exam      Right Left   External Normal Normal       Slit Lamp Exam      Right Left   Lids/Lashes Normal Normal   Conjunctiva/Sclera White and quiet White and quiet   Cornea Clear Clear   Anterior Chamber Deep and quiet Deep and quiet   Iris Round and reactive Round and reactive   Lens Posterior chamber intraocular lens Posterior chamber intraocular lens   Anterior Vitreous Normal Normal       Fundus Exam      Right Left   Posterior Vitreous Posterior vitreous detachment    Disc Normal    C/D Ratio 0.6    Macula Hard drusen, no exudates,  Macular thickening, Soft drusen, Pigmented atrophy, Retinal pigment epithelial mottling, Retinal pigment epithelial detachment    Vessels Normal, , no DR    Periphery Normal           IMAGING AND PROCEDURES  Imaging and Procedures for 09/25/20  OCT, Retina - OU - Both Eyes       Right Eye Quality was good. Scan locations included subfoveal. Central Foveal Thickness: 338. Progression has worsened. Findings include abnormal foveal contour, subretinal fluid, pigment epithelial detachment.   Left Eye Quality was good. Scan locations included subfoveal. Central Foveal Thickness: 288. Findings include normal foveal contour, retinal drusen .   Notes Today OD at 7 week interval, small change with increased serous retinal detachment adjacent to the vascularized pigment epithelial detachment nasal to the FAZ.  We will repeat  injection today Avastin and examination again in 5 weeks        Intravitreal Injection, Pharmacologic Agent - OD - Right Eye       Time Out 09/25/2020. 10:46 AM. Confirmed correct patient, procedure, site, and patient consented.   Anesthesia Topical anesthesia was used. Anesthetic medications included [place] 3.5%.   Procedure Preparation included Tobramycin 0.3%, 10% betadine to eyelids, 5% betadine to ocular surface. A 30 gauge needle was used.   Injection:  2.5 mg Bevacizumab (AVASTIN) 2.5mg /0.61mL SOSY   NDC: 29562-130-86, Lot: [id number]   Route: Intravitreal, Site: Right Eye  Post-op Post injection exam found visual acuity of at least counting fingers. The patient tolerated the procedure well. There were no complications. The patient received written and verbal post procedure care education. Post injection medications were not given.                 ASSESSMENT/PLAN:  Exudative age-related macular degeneration of right eye with active choroidal neovascularization (HCC) The nature of wet macular degeneration was discussed with the patient.  Forms of therapy reviewed include the use of Anti-VEGF medications injected painlessly into the eye, as well as other possible treatment modalities, including thermal laser therapy. Fellow eye involvement and risks were discussed with the patient. Upon the finding of wet age related macular degeneration, treatment will be offered. The treatment regimen is on a treat as needed basis with the intent to treat if necessary and extend interval of exams when possible. On average 1 out of 6 patients do not need lifetime therapy. However, the risk of recurrent disease is high for a lifetime.  Initially monthly, then periodic, examinations and evaluations will determine whether the next treatment is required on the day of the examination.  OD vastly improved overall yet still with subretinal fluid from a vascularized pigment epithelial detachment  currently at 7-week interval.  We will have been delayed due to recent shoulder surgery he is now 3 weeks post surgery  Today OD at 7 week interval, small change with increased serous retinal detachment adjacent to the vascularized pigment epithelial detachment nasal to the FAZ.  We will repeat injection today Avastin and examination again in 5 weeks       ICD-10-CM   1. Exudative age-related macular degeneration of right eye with active choroidal neovascularization (HCC)  H35.3211 OCT, Retina - OU - Both Eyes    Intravitreal Injection, Pharmacologic Agent - OD - Right Eye    bevacizumab (AVASTIN) SOSY 2.5 mg    1.  Persistent subretinal fluid right eye, currently 7-week interval follow-up.  Repeat injection today and examination again right eye in 5 weeks  2.  We will  have patient applied to good days for potential switch to [facility] next  3.  Ophthalmic Meds Ordered this visit:  Meds ordered this encounter  Medications  . bevacizumab (AVASTIN) SOSY 2.5 mg       Return in about 5 weeks (around 10/30/2020) for dilate, OD, AVASTIN OCT.  There are no Patient Instructions on file for this visit.   Explained the diagnoses, plan, and follow up with the patient and they expressed understanding.  Patient expressed understanding of the importance of proper follow up care.   [name] [name] M.D. Diseases & Surgery of the Retina and Vitreous Retina & Diabetic [place] 09/25/20     Abbreviations: M myopia (nearsighted); A astigmatism; H hyperopia (farsighted); P presbyopia; Mrx spectacle prescription;  CTL contact lenses; OD right eye; OS left eye; OU both eyes  XT exotropia; ET esotropia; PEK punctate epithelial keratitis; PEE punctate epithelial erosions; DES dry eye syndrome; MGD meibomian gland dysfunction; ATs artificial tears; PFAT's preservative free artificial tears; [place] nuclear sclerotic cataract; PSC posterior subcapsular cataract; ERM epi-retinal membrane; PVD posterior vitreous  detachment; RD retinal detachment; DM diabetes mellitus; DR diabetic retinopathy; NPDR non-proliferative diabetic retinopathy; PDR proliferative diabetic retinopathy; CSME clinically significant macular edema; DME diabetic macular edema; dbh dot blot hemorrhages; CWS cotton wool spot; POAG primary open angle glaucoma; C/D cup-to-disc ratio; HVF humphrey visual field; GVF goldmann visual field; OCT optical coherence tomography; IOP intraocular pressure; BRVO Branch retinal vein occlusion; CRVO central retinal vein occlusion; CRAO central retinal artery occlusion; BRAO branch retinal artery occlusion; RT retinal tear; SB scleral buckle; PPV pars plana vitrectomy; VH Vitreous hemorrhage; PRP panretinal laser photocoagulation; IVK intravitreal kenalog; VMT vitreomacular traction; MH Macular hole;  NVD neovascularization of the disc; NVE neovascularization elsewhere; AREDS age related eye disease study; ARMD age related macular degeneration; POAG primary open angle glaucoma; EBMD epithelial/anterior basement membrane dystrophy; ACIOL anterior chamber intraocular lens; IOL intraocular lens; PCIOL posterior chamber intraocular lens; Phaco/IOL phacoemulsification with intraocular lens placement; [place] photorefractive keratectomy; LASIK laser assisted in situ keratomileusis; HTN hypertension; DM diabetes mellitus; COPD chronic obstructive pulmonary disease

## 2020-09-26 ENCOUNTER — Encounter: Payer: PPO | Admitting: Physical Therapy

## 2020-09-26 DIAGNOSIS — I700 Atherosclerosis of aorta: Secondary | ICD-10-CM | POA: Insufficient documentation

## 2020-10-01 ENCOUNTER — Other Ambulatory Visit: Payer: Self-pay

## 2020-10-01 ENCOUNTER — Ambulatory Visit (INDEPENDENT_AMBULATORY_CARE_PROVIDER_SITE_OTHER): Payer: PPO

## 2020-10-01 ENCOUNTER — Ambulatory Visit: Payer: PPO

## 2020-10-01 ENCOUNTER — Telehealth: Payer: Self-pay | Admitting: Pharmacist

## 2020-10-01 DIAGNOSIS — I639 Cerebral infarction, unspecified: Secondary | ICD-10-CM | POA: Diagnosis not present

## 2020-10-01 DIAGNOSIS — M25512 Pain in left shoulder: Secondary | ICD-10-CM

## 2020-10-01 LAB — CUP PACEART REMOTE DEVICE CHECK
Date Time Interrogation Session: 20220328031342
Implantable Pulse Generator Implant Date: 20200316

## 2020-10-01 NOTE — Therapy (Signed)
[facility] [address], [name], [address] Phone: [phone]   Fax:  [phone]  Physical Therapy Treatment  Patient Details  Name: Andrew Cobb MRN: [id number] Date of Birth: 26-Jul-1950 No data recorded  Encounter Date: 10/01/2020   PT End of Session - 10/01/20 1158    Visit Number 4    Number of Visits 17    Date for PT Re-Evaluation 11/16/20    PT Start Time 1115    PT Stop Time 1153    PT Time Calculation (min) 38 min    Activity Tolerance Patient tolerated treatment well    Behavior During Therapy [facility] for tasks assessed/performed           Past Medical History:  Diagnosis Date  . Arthritis   . Asthma    has not needed since 6/21  . Cancer [facility])    prostate  . Chronic venous insufficiency    varicose vein lower extremity with inflammation  . Coronary artery disease 1996   two stents placed   . Diabetes mellitus without complication ([place])    type 2 on metformin  . GERD (gastroesophageal reflux disease)    no issues since gastric bypass surgery as stated per pt  . Hyperlipidemia   . Hypertension   . Hypogonadism in male   . MRSA (methicillin resistant Staphylococcus aureus) infection    07/30/2008 thru 08/07/2008  abdominal abcess  . Sleep apnea    on BIPAP  . Stented coronary artery   . Stroke ([place]) 09/07/2019   no defecits  . Thrombocythemia     Past Surgical History:  Procedure Laterality Date  . ANGIOPLASTY    . ANGIOPLASTY     with stent 04/07/1995  . ANTERIOR CERVICAL DECOMPRESSION/DISCECTOMY FUSION 4 LEVELS N/A 08/17/2017   Procedure: Anterior discectomy with fusion and plate fixation Cervical Three-Four, Four-Five, Five-Six, and Six-Seven Fusion;  Surgeon: Ditty, Kevan Ny, MD;  Location: [place];  Service: Neurosurgery;  Laterality: N/A;  Anterior discectomy with fusion and plate fixation Cervical Three-Four, Four-Five, Five-Six, and Six-Seven Fusion   . APPENDECTOMY      1966  . BUBBLE STUDY  09/20/2018   Procedure: BUBBLE STUDY;  Surgeon: Josue Hector, MD;  Location: [place];  Service: Cardiovascular;;  . [place]  . CARDIOVASCULAR STRESS TEST     07/31/2011  . CARPAL TUNNEL RELEASE Left   . CHOLECYSTECTOMY     2006  . colonscopy      08/25/2012  . EYE SURGERY Bilateral    cataract  . FUNCTIONAL ENDOSCOPIC SINUS SURGERY     11/10/2013  . GASTRIC BYPASS     10/05/2012  . HERNIA REPAIR     left inguinal 1981  . INCISION AND DRAINAGE ABSCESS Right 02/26/2017   Procedure: INCISION AND DRAINAGE ABSCESS;  Surgeon: Nickie Retort, MD;  Location: [facility];  Service: Urology;  Laterality: Right;  . JOINT REPLACEMENT     bilateral  . left ankle surgery      05/03/2003   . left carpel tunnel      09/18/1993  . left knee meniscal tear      01/25/2010  . left knee meniscal tear repair      05/04/1996  . left rotator cuff repair      05/03/2003   . LOOP RECORDER INSERTION N/A 09/20/2018   Procedure: LOOP RECORDER INSERTION;  Surgeon: Evans Lance, MD;  Location: [facility] INVASIVE CV  LAB;  Service: Cardiovascular;  Laterality: N/A;  . REPLACEMENT TOTAL KNEE BILATERAL  07/13/2015  . REVERSE SHOULDER ARTHROPLASTY Left 08/30/2020   Procedure: REVERSE SHOULDER ARTHROPLASTY;  Surgeon: Justice Britain, MD;  Location: [facility];  Service: Orthopedics;  Laterality: Left;  141min  . right ankle surgery      fracture has 2 screws 07/07/1997  . right carpel tunnel      05/16/1992  . right shoulder replacement      01/27/2006  . SCROTAL EXPLORATION Right 02/26/2017   Procedure: SCROTUM EXPLORATION;  Surgeon: Nickie Retort, MD;  Location: [facility];  Service: Urology;  Laterality: Right;  . TEE WITHOUT CARDIOVERSION N/A 09/20/2018   Procedure: TRANSESOPHAGEAL ECHOCARDIOGRAM (TEE);  Surgeon: Josue Hector, MD;  Location: [facility] ENDOSCOPY;  Service: Cardiovascular;  Laterality: N/A;  . TOTAL KNEE ARTHROPLASTY Left 07/13/2015   Procedure: LEFT  TOTAL KNEE ARTHROPLASTY;  Surgeon: Gaynelle Arabian, MD;  Location: [facility];  Service: Orthopedics;  Laterality: Left;    There were no vitals filed for this visit.   Subjective Assessment - 10/01/20 1118    Subjective Pt reports 3/10 pain and some stiffness. Pt reports he was able to mow the grass this weekend without an increase in symptoms. Patient reports exercises going well and continued use of ice to manage pain. Patient reports incision site is healing and decreased pain around incision.    Pertinent History Pt is a 70 year male presenting s/p reverse TSA 08/30/20. Patient is left handed. Wore sling for 1 week, and has been out of sling the past few weeks and is completing pendulum exercise and trying to move his shoulder as much as possible. Has follow up scheduled 10/08/20. Reports normal incision site with no drainage or redness. 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 He goes to the [facility] and does water aerobics 5x/day. He has 6 small grandkids that he enjoys visiting. He is able to complete ADLs modI, has shower seat. Pain is limited without medication worst 4/10; best: 2/10; currently 3/10. Pain is anterior and reports is mostly when he sits for too long. Pt denies N/V, B&B changes, unexplained weight fluctuation, saddle paresthesia, fever, night sweats, or unrelenting night pain at this time.    Limitations Writing;House hold activities;Lifting;Reading    How long can you sit comfortably? unlimited    How long can you stand comfortably? unlimited    How long can you walk comfortably? unlimited    Diagnostic tests Xrays post    Patient Stated Goals Get back to water aerobic    Currently in Pain? Yes    Pain Score 3     Pain Location Shoulder    Pain Orientation Left;Anterior    Pain Descriptors / Indicators Aching;Dull    Pain Type Surgical pain    Pain  Onset 1 to 4 weeks ago    Pain Frequency Intermittent          Manual: PROM - flex, ABD, ER, IR  G1 post glide with IR and ER  G2-3 inf glide with flexion and ABD: flexion more painful than ABD  STM scar tissue massageto incision  TherEx:  warmup: pulley flexion and ABD: 5 minutes, cueing to hold at the top  ABD Andrew Cobb climbs- attempted 1# AW  weight around wrist but too challenging with weight, with weight removed completed 1 set of  10  With cueing to hold at the top and keep shoulders square, used UE ranger for 1 set of 15 with a good stretchat the top   scapular pinches with RTB - 3 sets of 10 - cueing to perform motion slow and controlled and keep elbows at side   Seated Middle trap T exercises 3 sets of 8 with YTB   reviewed HEP - cueing needed for proper technique for ABD isometric to face forward and push forearm into the Andrew Cobb   Front delt raises with 1# weight  - 2 sets of 10- medium difficulty   Educated patient about proper form for HEP and to continue with current HEP.        PT Education - 10/01/20 1157    Education Details HEP form, therex form/technique, continued use of ice    Person(s) Educated Patient    Methods Demonstration;Tactile cues;Verbal cues;Explanation    Comprehension Verbalized understanding;Returned demonstration;Verbal cues required;Tactile cues required            PT Short Term Goals - 09/18/20 1409      PT SHORT TERM GOAL #1   Title Pt will be independent with HEP in order to improve strength and decrease pain in order to improve pain-free function at home and work.    Baseline 09/18/20 HEP given    Time 4    Period Weeks    Status New             PT Long Term Goals - 09/18/20 1410      PT LONG TERM GOAL #1   Title Patient will increase FOTO score to 58 to demonstrate predicted increase in functional mobility to complete ADLs    Baseline 09/18/20 45    Time 8    Period Weeks    Status New      PT LONG TERM GOAL #2   Title  Pt will decrease worst pain as reported on NPRS by at least 3 points in order to demonstrate clinically significant reduction in pain.    Baseline 09/18/20 4/10    Time 8    Period Weeks    Status New      PT LONG TERM GOAL #3   Title Patient will demonstrate overhead abd and flex to at least 40d and IR to at least L2 in order to complete overhead ADLs and ind dressing/bathing    Baseline 09/18/20 fkexL 116 abd 92 IR L PSIS    Time 8    Period Weeks    Status New      PT LONG TERM GOAL #4   Title Pt will demonstrate gross 4/5 L shoulder and periscapular strength in order to complete household ADLs    Baseline 09/18/20                 Plan - 10/01/20 1201    Clinical Impression Statement PT continued to progress therex in order to increase mobility and strenght of the L shoulder. Patient respondeed well to mobiliations to inrease shoulder mobility with increased PROM. Patient demonstrated proper therex technique with moderate verbal cueing and tactile cues with no increase in symptoms. Patient is motivated and will continue current HEP to increase shoulder strength and mobility. PT will continue progressions to tolerance.    Personal Factors and Comorbidities Age;Fitness;Comorbidity 1;Comorbidity 2;Comorbidity 3+;Past/Current Experience;Time since onset of injury/illness/exacerbation;Profession    Comorbidities HTN, DM2, HLD    Examination-Activity Limitations Reach Overhead;Carry;Lift;Bathing;Dressing;Sleep;Transfers    Examination-Participation Restrictions Community Activity;Yard Work;Meal  Prep;Laundry;Driving;Occupation    Stability/Clinical Decision Making Evolving/Moderate complexity    Clinical Decision Making Moderate    Rehab Potential Good    PT Frequency 2x / week    PT Duration 8 weeks    PT Treatment/Interventions Moist Heat;Traction;Dry needling;DME Instruction;Therapeutic exercise;Patient/family education;Manual techniques;Passive range of motion;Joint  Manipulations;Spinal Manipulations;ADLs/Self Care Home Management;Cryotherapy;Electrical Stimulation;Ultrasound;Functional mobility training;Neuromuscular re-education;Therapeutic activities;Iontophoresis 4mg /ml Dexamethasone;Contrast Bath;Taping;Scar mobilization    PT Next Visit Plan HEP review, continue PROM with mobilizations, progress therex    PT Home Exercise Plan pulleys, isometrics, scapular retraction row- yellow theraband    Consulted and Agree with Plan of Care Patient           Patient will benefit from skilled therapeutic intervention in order to improve the following deficits and impairments:  Pain,Postural dysfunction,Impaired UE functional use,Impaired flexibility,Increased fascial restricitons,Decreased strength,Decreased coordination,Decreased activity tolerance,Decreased mobility,Hypomobility,Improper body mechanics,Impaired perceived functional ability,Decreased range of motion,Decreased scar mobility  Visit Diagnosis: Acute pain of left shoulder     Problem List Patient Active Problem List   Diagnosis Date Noted  . Aortic atherosclerosis ([place]) 09/26/2020  . Preoperative clearance 08/27/2020  . AKI (acute kidney injury) ([place]) 07/12/2020  . Influenza A 07/12/2020  . Hypovolemia due to dehydration 07/11/2020  . Serous detachment of retinal pigment epithelium of right eye 01/31/2020  . Pseudophakia 01/31/2020  . Exudative age-related macular degeneration of right eye with active choroidal neovascularization ([place]) 01/31/2020  . Situational depression 10/18/2019  . Seizures ([place]) 10/18/2019  . Hyperlipidemia associated with type 2 diabetes mellitus ([place]) 06/19/2019  . Expressive aphasia 06/19/2019  . Vitamin D deficiency 06/19/2019  . Chronic venous insufficiency   . Asthma   . Arthritis   . GERD (gastroesophageal reflux disease)   . Hypogonadism in male   . History of stroke   . H/O right coronary artery stent placement   . Allergic rhinitis 08/19/2018  .  Cervical radiculopathy 08/17/2017  . Advanced care planning/counseling discussion 07/21/2017  . Prostate cancer ([employer]) 12/08/2016  . H/O bariatric surgery 07/17/2016  . Hypertension associated with diabetes ([place]) 04/24/2015  . Uncontrolled type 2 diabetes mellitus with hyperglycemia, without long-term current use of insulin ([place]) 04/24/2015  . Chronic sinusitis 08/13/2009  . Coronary atherosclerosis 04/02/2009  . OSA (obstructive sleep apnea) 04/02/2009  . Coronary artery disease 1996   Andrew Cobb, SPT   Cobb,Andrew C 10/01/2020, 2:05 PM  [facility] [address], [name], [address] Phone: [phone]   Fax:  [phone]  Name: Andrew Cobb MRN: [id number] Date of Birth: [birth year]

## 2020-10-01 NOTE — Chronic Care Management (AMB) (Addendum)
Chronic Care Management Pharmacy Assistant   Name: Andrew Cobb  MRN: [id number] DOB: 07-10-1950  Reason for Encounter: Disease State/General Adherence Call  Recent office visits:  n/a  Recent consult visits:  09/25/2020 OV (ophthalmology) Deloria Lair, MD; no medication changes indicated.  Hospital visits:  08/30/2020 Procedure (orthopedics) Justice Britain, MD; left shoulder rotator cuff tear arthropathy, PROCEDURE: Left shoulder reverse arthroplasty utilizing a press-fit size 9 Arthrex stem with a neutral metaphysis, +6 polyethylene insert, 39/+4 glenosphere on a small/+2 baseplate  Medications: Outpatient Encounter Medications as of 10/01/2020  Medication Sig Note   acyclovir (ZOVIRAX) 400 MG tablet Take 1 tablet (400 mg total) by mouth 2 (two) times daily.    amitriptyline (ELAVIL) 25 MG tablet Take 1 tablet (25 mg total) by mouth at bedtime.    aspirin EC 81 MG EC tablet Take 1 tablet (81 mg total) by mouth daily. 08/13/2020: On hold for procedure     atorvastatin (LIPITOR) 40 MG tablet Take 1 tablet (40 mg total) by mouth at bedtime.    betamethasone dipropionate (DIPROLENE) 0.05 % ointment Apply 1 application topically 2 (two) times daily as needed for rash.    cetirizine (ZYRTEC) 10 MG tablet Take 10 mg by mouth daily.    Cholecalciferol (VITAMIN D) 2000 units CAPS Take 2,000 Units by mouth daily.    Cyanocobalamin (B-12) 5000 MCG CAPS Take 5,000 mcg by mouth daily.    cyclobenzaprine (FLEXERIL) 10 MG tablet Take 1 tablet (10 mg total) by mouth 3 (three) times daily as needed for muscle spasms.    empagliflozin (JARDIANCE) 10 MG TABS tablet Take 1 tablet (10 mg total) by mouth daily before breakfast.    Lancets (ONETOUCH ULTRASOFT) lancets 1 each by Other route 2 (two) times daily. Dx E11.9    levETIRAcetam (KEPPRA) 500 MG tablet Take 1 tablet (500 mg total) by mouth 2 (two) times daily.    metFORMIN (GLUCOPHAGE) 1000 MG tablet Take 1 tablet (1,000 mg total) by mouth 2  (two) times daily with a meal. 08/13/2020: Pt plans to switch from 500 to 1000 mg when he runs out of 500 mg tabs   Multiple Vitamins-Minerals (MULTIVITAMIN PO) Take 1 tablet by mouth daily.     ondansetron (ZOFRAN) 4 MG tablet Take 1 tablet (4 mg total) by mouth every 8 (eight) hours as needed for nausea or vomiting.    ONETOUCH ULTRA test strip USE TWICE DAILY    oxyCODONE-acetaminophen (PERCOCET) 5-325 MG tablet Take 1 tablet by mouth every 4 (four) hours as needed (max 6 q).    tamsulosin (FLOMAX) 0.4 MG CAPS capsule Take 1 capsule (0.4 mg total) by mouth at bedtime.    telmisartan (MICARDIS) 80 MG tablet Take 0.5 tablets (40 mg total) by mouth daily.    No facility-administered encounter medications on file as of 10/01/2020.     Have you had any problems recently with your health? Patient's wife [name] states the patient is healing well from his recent shoulder surgery.  Have you had any problems with your pharmacy? Patient's wife states they have not had any problems recently with his pharmacy.  What issues or side effects are you having with your medications? Patient's wife states the patient is not currently having any issues or side effects from any of his medications.  What would you like me to pass along to Birdena Crandall , CPP for her to help you with?  Patient's wife states the patient needs to reapply for patient assistance for  medication Jardiance. She states she dropped off her husband's income information back in 06/2020.  **I called patient assistance program [employer]. The patient's last application expired 81/1572. A new application has been mailed out to the patient for completion. Patient to return completed application to the office once completed.  What can we do to take care of you better?  Patient's wife states the patient is currently doing well.   Future Appointments  Date Time Provider [place]  10/01/2020 11:15 AM Durwin Reges M, PT  [facility] None  10/09/2020  1:45 PM Kelton Pillar, PT [facility] None  10/10/2020  8:20 AM Marnee Guarneri T, NP [facility]  10/16/2020  1:45 PM Kelton Pillar, PT [facility] None  10/23/2020  1:00 PM Kelton Pillar, PT [facility] None  10/30/2020  1:00 PM Kelton Pillar, PT [facility] None  11/05/2020  7:10 AM [facility] DEVICE REMOTES [facility]  11/08/2020  8:45 AM [name], [name], MD [facility] None  11/29/2020  9:00 AM [facility] LAB [facility] None  12/06/2020 10:00 AM Noreene Filbert, MD [facility] None  12/10/2020  7:10 AM [facility] DEVICE REMOTES [facility]  01/14/2021  7:10 AM [facility] DEVICE REMOTES [facility]  02/18/2021  7:10 AM [facility] DEVICE REMOTES [facility]  03/25/2021  7:10 AM [facility] DEVICE REMOTES [facility]  04/29/2021  7:10 AM [facility] DEVICE REMOTES [facility]  06/03/2021  7:10 AM [facility] DEVICE REMOTES [facility]  07/10/2021 10:45 AM McCue, [name], NP [facility] None  07/26/2021 10:30 AM CFP NURSE HEALTH ADVISOR [facility] PEC    Star Rating Drugs: Atorvastatin 40 mg last filled 07/16/2020 90 DS Telmisartan 80 mg last filled 05/17/2020 90 DS Metformin 1000 mg last filled 07/16/2020 90 DS  April D Calhoun, [place] Pharmacist Assistant [phone]   I have reviewed the care management and care coordination activities outlined in this encounter and I am certifying that I agree with the content of this note.  [name]. Kenton Kingfisher PharmD, [facility] [phone]

## 2020-10-02 ENCOUNTER — Ambulatory Visit: Payer: PPO | Admitting: Nurse Practitioner

## 2020-10-03 ENCOUNTER — Telehealth: Payer: Self-pay | Admitting: Pharmacist

## 2020-10-03 DIAGNOSIS — E119 Type 2 diabetes mellitus without complications: Secondary | ICD-10-CM | POA: Diagnosis not present

## 2020-10-03 DIAGNOSIS — H353122 Nonexudative age-related macular degeneration, left eye, intermediate dry stage: Secondary | ICD-10-CM | POA: Diagnosis not present

## 2020-10-03 DIAGNOSIS — H35371 Puckering of macula, right eye: Secondary | ICD-10-CM | POA: Diagnosis not present

## 2020-10-03 DIAGNOSIS — H353211 Exudative age-related macular degeneration, right eye, with active choroidal neovascularization: Secondary | ICD-10-CM | POA: Diagnosis not present

## 2020-10-03 DIAGNOSIS — Z961 Presence of intraocular lens: Secondary | ICD-10-CM | POA: Diagnosis not present

## 2020-10-03 DIAGNOSIS — H02831 Dermatochalasis of right upper eyelid: Secondary | ICD-10-CM | POA: Diagnosis not present

## 2020-10-03 DIAGNOSIS — H02834 Dermatochalasis of left upper eyelid: Secondary | ICD-10-CM | POA: Diagnosis not present

## 2020-10-03 DIAGNOSIS — H40013 Open angle with borderline findings, low risk, bilateral: Secondary | ICD-10-CM | POA: Diagnosis not present

## 2020-10-03 LAB — HM DIABETES EYE EXAM

## 2020-10-03 NOTE — Telephone Encounter (Signed)
  Chronic Care Management   Note  10/03/2020 Name: Andrew Cobb MRN: [id number] DOB: [birth year]    Mailed patient portion of [place] patient assistance application to home address on file for signature and return to office.  [name]. Kenton Kingfisher PharmD, [facility] [phone]

## 2020-10-04 ENCOUNTER — Encounter: Payer: PPO | Admitting: Physical Therapy

## 2020-10-05 ENCOUNTER — Encounter: Payer: Self-pay | Admitting: Nurse Practitioner

## 2020-10-08 ENCOUNTER — Telehealth: Payer: Self-pay | Admitting: *Deleted

## 2020-10-08 NOTE — Telephone Encounter (Signed)
Signed and placed in tray

## 2020-10-08 NOTE — Telephone Encounter (Signed)
Pt brought in form for assistance with medications. Placed in [place] for review

## 2020-10-08 NOTE — Telephone Encounter (Signed)
Forms filled out and placed in bin for providers signature.

## 2020-10-09 ENCOUNTER — Ambulatory Visit: Payer: PPO | Attending: Orthopedic Surgery | Admitting: Physical Therapy

## 2020-10-09 ENCOUNTER — Encounter: Payer: Self-pay | Admitting: Physical Therapy

## 2020-10-09 ENCOUNTER — Other Ambulatory Visit: Payer: Self-pay

## 2020-10-09 ENCOUNTER — Telehealth: Payer: Self-pay | Admitting: *Deleted

## 2020-10-09 DIAGNOSIS — M25512 Pain in left shoulder: Secondary | ICD-10-CM | POA: Insufficient documentation

## 2020-10-09 DIAGNOSIS — M25612 Stiffness of left shoulder, not elsewhere classified: Secondary | ICD-10-CM

## 2020-10-09 NOTE — Telephone Encounter (Signed)
Pt. Came in and brought a form to be completed ad signed for the [facility] in [facility] for REVIEW

## 2020-10-09 NOTE — Telephone Encounter (Signed)
Noted! Thank you

## 2020-10-09 NOTE — Telephone Encounter (Signed)
Paperwork placed in hanging folder

## 2020-10-09 NOTE — Therapy (Signed)
[facility] [address], [name], [address] Phone: [phone]   Fax:  [phone]  Physical Therapy Treatment  Patient Details  Name: Andrew Cobb MRN: [id number] Date of Birth: 1951/03/28 No data recorded  Encounter Date: 10/09/2020   PT End of Session - 10/09/20 1428    Visit Number 5    Number of Visits 17    Date for PT Re-Evaluation 11/16/20    PT Start Time 2671    PT Stop Time 1426    PT Time Calculation (min) 41 min    Activity Tolerance Patient tolerated treatment well    Behavior During Therapy [facility] for tasks assessed/performed           Past Medical History:  Diagnosis Date  . Arthritis   . Asthma    has not needed since 6/21  . Cancer [facility])    prostate  . Chronic venous insufficiency    varicose vein lower extremity with inflammation  . Coronary artery disease 1996   two stents placed   . Diabetes mellitus without complication ([place])    type 2 on metformin  . GERD (gastroesophageal reflux disease)    no issues since gastric bypass surgery as stated per pt  . Hyperlipidemia   . Hypertension   . Hypogonadism in male   . MRSA (methicillin resistant Staphylococcus aureus) infection    07/30/2008 thru 08/07/2008  abdominal abcess  . Sleep apnea    on BIPAP  . Stented coronary artery   . Stroke ([place]) 09/07/2019   no defecits  . Thrombocythemia     Past Surgical History:  Procedure Laterality Date  . ANGIOPLASTY    . ANGIOPLASTY     with stent 04/07/1995  . ANTERIOR CERVICAL DECOMPRESSION/DISCECTOMY FUSION 4 LEVELS N/A 08/17/2017   Procedure: Anterior discectomy with fusion and plate fixation Cervical Three-Four, Four-Five, Five-Six, and Six-Seven Fusion;  Surgeon: Ditty, Kevan Ny, MD;  Location: [facility];  Service: Neurosurgery;  Laterality: N/A;  Anterior discectomy with fusion and plate fixation Cervical Three-Four, Four-Five, Five-Six, and Six-Seven Fusion   . APPENDECTOMY      1966  . BUBBLE STUDY  09/20/2018   Procedure: BUBBLE STUDY;  Surgeon: Josue Hector, MD;  Location: [place];  Service: Cardiovascular;;  . [place]  . CARDIOVASCULAR STRESS TEST     07/31/2011  . CARPAL TUNNEL RELEASE Left   . CHOLECYSTECTOMY     2006  . colonscopy      08/25/2012  . EYE SURGERY Bilateral    cataract  . FUNCTIONAL ENDOSCOPIC SINUS SURGERY     11/10/2013  . GASTRIC BYPASS     10/05/2012  . HERNIA REPAIR     left inguinal 1981  . INCISION AND DRAINAGE ABSCESS Right 02/26/2017   Procedure: INCISION AND DRAINAGE ABSCESS;  Surgeon: Nickie Retort, MD;  Location: [facility];  Service: Urology;  Laterality: Right;  . JOINT REPLACEMENT     bilateral  . left ankle surgery      05/03/2003   . left carpel tunnel      09/18/1993  . left knee meniscal tear      01/25/2010  . left knee meniscal tear repair      05/04/1996  . left rotator cuff repair      05/03/2003   . LOOP RECORDER INSERTION N/A 09/20/2018   Procedure: LOOP RECORDER INSERTION;  Surgeon: Evans Lance, MD;  Location: [facility] IN[facility]AS[facility]  [facility];  Service: Cardiovascular;  Laterality: N/A;  . REPLACEMENT TOTAL KNEE BILATERAL  07/13/2015  . REVERSE SHOULDER ARTHROPLASTY Left 08/30/2020   Procedure: REVERSE SHOULDER ARTHROPLASTY;  Surgeon: Justice Britain, MD;  Location: [facility];  Service: Orthopedics;  Laterality: Left;  163min  . right ankle surgery      fracture has 2 screws 07/07/1997  . right carpel tunnel      05/16/1992  . right shoulder replacement      01/27/2006  . SCROTAL EXPLORATION Right 02/26/2017   Procedure: SCROTUM EXPLORATION;  Surgeon: Nickie Retort, MD;  Location: [facility];  Service: Urology;  Laterality: Right;  . TEE WITHOUT CARDIOVERSION N/A 09/20/2018   Procedure: TRANSESOPHAGEAL ECHOCARDIOGRAM (TEE);  Surgeon: Josue Hector, MD;  Location: [facility] ENDOSCOPY;  Service: Cardiovascular;  Laterality: N/A;  . TOTAL KNEE ARTHROPLASTY Left 07/13/2015   Procedure: LEFT  TOTAL KNEE ARTHROPLASTY;  Surgeon: Gaynelle Arabian, MD;  Location: [facility];  Service: Orthopedics;  Laterality: Left;    There were no vitals filed for this visit.  Subjective Assessment - 10/09/20 1342    Subjective Pt reports no pain today but still feels some stiffness. Patient reports he is do his exercises throughout the day to help decrease the stiffness. Patient reports incision site is healing with no pain around the area.    Pertinent History Pt is a 70 year male presenting s/p reverse TSA 08/30/20. Patient is left handed. Wore sling for 1 week, and has been out of sling the past few weeks and is completing pendulum exercise and trying to move his shoulder as much as possible. Has follow up scheduled 10/08/20. Reports normal incision site with no drainage or redness. 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 He goes to the [facility] and does water aerobics 5x/day. He has 6 small grandkids that he enjoys visiting. He is able to complete ADLs modI, has shower seat. Pain is limited without medication worst 4/10; best: 2/10; currently 3/10. Pain is anterior and reports is mostly when he sits for too long. Pt denies N/V, B&B changes, unexplained weight fluctuation, saddle paresthesia, fever, night sweats, or unrelenting night pain at this time.    Limitations Writing;House hold activities;Lifting;Reading    How long can you sit comfortably? unlimited    How long can you stand comfortably? unlimited    How long can you walk comfortably? unlimited    Diagnostic tests Xrays post    Patient Stated Goals Get back to water aerobics    Currently in Pain? No/denies    Pain Score 0-No pain           Manual: PROM - flex, ABD, ER, IR  G1 post glidewith ER  G2-3 inf glide with ABD: ROM improving since evaluation  STM scar tissue massageto incision  TherEx:  warmup: pulley  flexion and ABD: 5 minutes, cueing to hold at the top and move arms instead of whole trunk   ABD Taro Hidrogo climbs 2 set of 10  With cueing to hold at the top and keep shoulders square, tactile cues to keep shoulders square to increase stretch at the top of the motion   AAROM for ABD with yard stick 2 set of 15, cueing to move only arms/shoulders   Front delt raises with 2# weight - 2 sets of 8, cueing to pinch shoulder blades together before performing the  motion   Lateral delt raises with 2# weight  - 2 sets of 8, cueing to avoid raising shoulders towards ears   Seated Y raise with YTB- 2 sets of 10, good carryover after initial demonstration  Standing middle trap 3 sets of 10 with 1# DB bilaterally, tactile cues required to pinch scapula together   Educated patient about proper form for HEP and to continue with current HEP, focusing on ABD with pulley.     PT Education - 10/09/20 1349    Education Details Therex form, HEP form    Person(s) Educated Patient    Methods Explanation;Demonstration;Tactile cues;Verbal cues    Comprehension Verbalized understanding;Returned demonstration;Verbal cues required;Tactile cues required            PT Short Term Goals - 09/18/20 1409      PT SHORT TERM GOAL #1   Title Pt will be independent with HEP in order to improve strength and decrease pain in order to improve pain-free function at home and work.    Baseline 09/18/20 HEP given    Time 4    Period Weeks    Status New             PT Long Term Goals - 09/18/20 1410      PT LONG TERM GOAL #1   Title Patient will increase FOTO score to 58 to demonstrate predicted increase in functional mobility to complete ADLs    Baseline 09/18/20 45    Time 8    Period Weeks    Status New      PT LONG TERM GOAL #2   Title Pt will decrease worst pain as reported on NPRS by at least 3 points in order to demonstrate clinically significant reduction in pain.    Baseline 09/18/20 4/10    Time 8     Period Weeks    Status New      PT LONG TERM GOAL #3   Title Patient will demonstrate overhead abd and flex to at least 40d and IR to at least L2 in order to complete overhead ADLs and ind dressing/bathing    Baseline 09/18/20 fkexL 116 abd 92 IR L PSIS    Time 8    Period Weeks    Status New      PT LONG TERM GOAL #4   Title Pt will demonstrate gross 4/5 L shoulder and periscapular strength in order to complete household ADLs    Baseline 09/18/20                 Plan - 10/09/20 1429    Clinical Impression Statement PT continue to progress therex in order to increase mobility and strength of L shoulder. Patient is demonstrating increased ROM and strength and tolerates exercises well. Patient requires cueing for proper form throughout all sets of each exercise. Patient responded well to mobilizations to increase shoulder mobility with increased PROM. Patient is motivated to return to PLOF. PT will continue progressions to tolerance.    Personal Factors and Comorbidities Age;Fitness;Comorbidity 1;Comorbidity 2;Comorbidity 3+;Past/Current Experience;Time since onset of injury/illness/exacerbation;Profession    Comorbidities HTN, DM2, HLD    Examination-Activity Limitations Reach Overhead;Carry;Lift;Bathing;Dressing;Sleep;Transfers    Examination-Participation Restrictions Community Activity;Yard Work;Meal Prep;Laundry;Driving;Occupation    Stability/Clinical Decision Making Evolving/Moderate complexity    Clinical Decision Making Moderate    Rehab Potential Good    PT Frequency 2x / week    PT Duration 8 weeks    PT Treatment/Interventions Moist Heat;Traction;Dry needling;DME Instruction;Therapeutic exercise;Patient/family education;Manual techniques;Passive  range of motion;Joint Manipulations;Spinal Manipulations;ADLs/Self Care Home Management;Cryotherapy;Electrical Stimulation;Ultrasound;Functional mobility training;Neuromuscular re-education;Therapeutic activities;Iontophoresis 4mg /ml  Dexamethasone;Contrast Bath;Taping;Scar mobilization    PT Next Visit Plan HEP review, continue PROM with mobilizations, progress therex    PT Home Exercise Plan pulleys, isometrics, scapular retraction row- yellow theraband    Consulted and Agree with Plan of Care Patient           Patient will benefit from skilled therapeutic intervention in order to improve the following deficits and impairments:  Pain,Postural dysfunction,Impaired UE functional use,Impaired flexibility,Increased fascial restricitons,Decreased strength,Decreased coordination,Decreased activity tolerance,Decreased mobility,Hypomobility,Improper body mechanics,Impaired perceived functional ability,Decreased range of motion,Decreased scar mobility  Visit Diagnosis: Acute pain of left shoulder  Stiffness of left shoulder, not elsewhere classified     Problem List Patient Active Problem List   Diagnosis Date Noted  . Aortic atherosclerosis ([place]) 09/26/2020  . S/P reverse total shoulder arthroplasty, left 09/11/2020  . Serous detachment of retinal pigment epithelium of right eye 01/31/2020  . Pseudophakia 01/31/2020  . Exudative age-related macular degeneration of right eye with active choroidal neovascularization ([place]) 01/31/2020  . Situational depression 10/18/2019  . Seizures ([place]) 10/18/2019  . Hyperlipidemia associated with type 2 diabetes mellitus ([place]) 06/19/2019  . Expressive aphasia 06/19/2019  . Vitamin D deficiency 06/19/2019  . Chronic venous insufficiency   . Asthma   . Arthritis   . GERD (gastroesophageal reflux disease)   . Hypogonadism in male   . History of stroke   . H/O right coronary artery stent placement   . Allergic rhinitis 08/19/2018  . Cervical radiculopathy 08/17/2017  . Advanced care planning/counseling discussion 07/21/2017  . Prostate cancer ([place]) 12/08/2016  . H/O bariatric surgery 07/17/2016  . Hypertension associated with diabetes ([place]) 04/24/2015  . Uncontrolled type 2  diabetes mellitus with hyperglycemia, without long-term current use of insulin ([place]) 04/24/2015  . Chronic sinusitis 08/13/2009  . Coronary atherosclerosis 04/02/2009  . OSA (obstructive sleep apnea) 04/02/2009  . Coronary artery disease 1996    Andrew Cobb DPT [address] SPT  Andrew Cobb 10/09/2020, 5:25 PM  [facility] [address] [name], [address] Phone: [phone]   Fax:  [phone]  Name: Andrew Cobb MRN: [id number] Date of Birth: 06-09-1951

## 2020-10-10 ENCOUNTER — Ambulatory Visit: Payer: PPO | Admitting: Nurse Practitioner

## 2020-10-11 ENCOUNTER — Encounter: Payer: PPO | Admitting: Physical Therapy

## 2020-10-15 NOTE — Progress Notes (Signed)
Carelink Summary Report / Loop Recorder 

## 2020-10-16 ENCOUNTER — Other Ambulatory Visit: Payer: Self-pay

## 2020-10-16 ENCOUNTER — Encounter: Payer: Self-pay | Admitting: Physical Therapy

## 2020-10-16 ENCOUNTER — Ambulatory Visit: Payer: PPO | Admitting: Physical Therapy

## 2020-10-16 DIAGNOSIS — M25512 Pain in left shoulder: Secondary | ICD-10-CM

## 2020-10-16 DIAGNOSIS — M25612 Stiffness of left shoulder, not elsewhere classified: Secondary | ICD-10-CM

## 2020-10-16 NOTE — Therapy (Signed)
[place] [facility] [address], [name], [address] Phone: [phone]   Fax:  [phone]  Physical Therapy Treatment Physical Therapy Progress Note Dates of reporting period  09/18/20  to  10/16/20  Patient Details  Name: Andrew Cobb MRN: [id number] Date of Birth: 1951-07-05 No data recorded  Encounter Date: 10/16/2020   PT End of Session - 10/16/20 1428    Visit Number 6    Number of Visits 17    Date for PT Re-Evaluation 11/16/20    PT Start Time 0143    PT Stop Time 0225    PT Time Calculation (min) 42 min    Activity Tolerance Patient tolerated treatment well    Behavior During Therapy [facility] for tasks assessed/performed           Past Medical History:  Diagnosis Date  . Arthritis   . Asthma    has not needed since 6/21  . Cancer [facility])    prostate  . Chronic venous insufficiency    varicose vein lower extremity with inflammation  . Coronary artery disease 1996   two stents placed   . Diabetes mellitus without complication ([place])    type 2 on metformin  . GERD (gastroesophageal reflux disease)    no issues since gastric bypass surgery as stated per pt  . Hyperlipidemia   . Hypertension   . Hypogonadism in male   . MRSA (methicillin resistant Staphylococcus aureus) infection    07/30/2008 thru 08/07/2008  abdominal abcess  . Sleep apnea    on BIPAP  . Stented coronary artery   . Stroke ([place]) 09/07/2019   no defecits  . Thrombocythemia     Past Surgical History:  Procedure Laterality Date  . ANGIOPLASTY    . ANGIOPLASTY     with stent 04/07/1995  . ANTERIOR CERVICAL DECOMPRESSION/DISCECTOMY FUSION 4 LEVELS N/A 08/17/2017   Procedure: Anterior discectomy with fusion and plate fixation Cervical Three-Four, Four-Five, Five-Six, and Six-Seven Fusion;  Surgeon: Ditty, Kevan Ny, MD;  Location: [place];  Service: Neurosurgery;  Laterality: N/A;  Anterior discectomy with fusion and plate  fixation Cervical Three-Four, Four-Five, Five-Six, and Six-Seven Fusion   . APPENDECTOMY     1966  . BUBBLE STUDY  09/20/2018   Procedure: BUBBLE STUDY;  Surgeon: Josue Hector, MD;  Location: [place];  Service: Cardiovascular;;  . [place]  . CARDIOVASCULAR STRESS TEST     07/31/2011  . CARPAL TUNNEL RELEASE Left   . CHOLECYSTECTOMY     2006  . colonscopy      08/25/2012  . EYE SURGERY Bilateral    cataract  . FUNCTIONAL ENDOSCOPIC SINUS SURGERY     11/10/2013  . GASTRIC BYPASS     10/05/2012  . HERNIA REPAIR     left inguinal 1981  . INCISION AND DRAINAGE ABSCESS Right 02/26/2017   Procedure: INCISION AND DRAINAGE ABSCESS;  Surgeon: Nickie Retort, MD;  Location: [facility];  Service: Urology;  Laterality: Right;  . JOINT REPLACEMENT     bilateral  . left ankle surgery      05/03/2003   . left carpel tunnel      09/18/1993  . left knee meniscal tear      01/25/2010  . left knee meniscal tear repair      05/04/1996  . left rotator cuff repair      05/03/2003   . LOOP RECORDER INSERTION N/A 09/20/2018   Procedure:  LOOP RECORDER INSERTION;  Surgeon: Evans Lance, MD;  Location: [place] [facility];  Service: Cardiovascular;  Laterality: N/A;  . REPLACEMENT TOTAL KNEE BILATERAL  07/13/2015  . REVERSE SHOULDER ARTHROPLASTY Left 08/30/2020   Procedure: REVERSE SHOULDER ARTHROPLASTY;  Surgeon: Justice Britain, MD;  Location: [facility];  Service: Orthopedics;  Laterality: Left;  144min  . right ankle surgery      fracture has 2 screws 07/07/1997  . right carpel tunnel      05/16/1992  . right shoulder replacement      01/27/2006  . SCROTAL EXPLORATION Right 02/26/2017   Procedure: SCROTUM EXPLORATION;  Surgeon: Nickie Retort, MD;  Location: [facility];  Service: Urology;  Laterality: Right;  . TEE WITHOUT CARDIOVERSION N/A 09/20/2018   Procedure: TRANSESOPHAGEAL ECHOCARDIOGRAM (TEE);  Surgeon: Josue Hector, MD;  Location: [facility];  Service:  Cardiovascular;  Laterality: N/A;  . TOTAL KNEE ARTHROPLASTY Left 07/13/2015   Procedure: LEFT TOTAL KNEE ARTHROPLASTY;  Surgeon: Gaynelle Arabian, MD;  Location: [facility];  Service: Orthopedics;  Laterality: Left;    There were no vitals filed for this visit.   Subjective Assessment - 10/16/20 1342    Subjective Patient reports no pain today and reports the stiffness is improve. Patient reports he has been attending pool exercises classes 5 days a week at the [facility] with no increase in pain. Patient continues to use ice to decrease pain.    Pertinent History Pt is a 70 year male presenting s/p reverse TSA 08/30/20. Patient is left handed. Wore sling for 1 week, and has been out of sling the past few weeks and is completing pendulum exercise and trying to move his shoulder as much as possible. Has follow up scheduled 10/08/20. Reports normal incision site with no drainage or redness. 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 He goes to the [facility] and does water aerobics 5x/day. He has 6 small grandkids that he enjoys visiting. He is able to complete ADLs modI, has shower seat. Pain is limited without medication worst 4/10; best: 2/10; currently 3/10. Pain is anterior and reports is mostly when he sits for too long. Pt denies N/V, B&B changes, unexplained weight fluctuation, saddle paresthesia, fever, night sweats, or unrelenting night pain at this time.    Limitations Writing;House hold activities;Lifting;Reading    How long can you sit comfortably? unlimited    How long can you stand comfortably? unlimited    How long can you walk comfortably? unlimited    Diagnostic tests Xrays post    Patient Stated Goals Get back to water aerobics    Currently in Pain? No/denies           AROM  Shoulder flexion - 121  Shoulder ABD 120 with significant shoulder elevation IR - to PSIS    MMT:  Shoulder flexion 5/5 Shoulder ABD 5/5 IR 5/5  ER 5/5  Middle trap 5/5  Lower trap 3/3    Manual: G1 post glidewith IR  STM scar tissue massageto incision  TherEx:  warmup: pulley flexion and ABD: 5 minutes, cueing to hold at the top and move arms instead of whole trunk   Towel behind back for IR 3 sets of 20, cueing for good hold at end range and to avoid reaching past back pocket    ABD Djibril Glogowski climbs 2 set of 10  With  cueing to hold at the top and keep shoulders square, tactile cues to keep shoulders square to increase stretch at the top of the motion   Seated shoulder flexion with LUE 1 set of 10   Seated shoulder scaption with LUE 1 set of 10    Supine Y raise with YTB- 2 sets of 10, good carryover after initial demonstration, cueing to squeeze shoulder blades down and together   Seated middle trap T  With YTB 2 sets of   Educated patient about proper form for updated HEP including pulley,  ABD Llesenia Fogal climbs,  seated AROM with flexion and scaption     PT Education - 10/16/20 1519    Education Details therex form, updated HEP, progress with PT    Person(s) Educated Patient    Methods Explanation;Demonstration;Tactile cues;Verbal cues    Comprehension Verbalized understanding;Returned demonstration;Verbal cues required;Tactile cues required            PT Short Term Goals - 10/16/20 1431      PT SHORT TERM GOAL #1   Title Pt will be independent with HEP in order to improve strength and decrease pain in order to improve pain-free function at home and work.    Baseline 10/16/20 HEP updated    Time 4    Period Weeks    Status Revised             PT Long Term Goals - 10/16/20 1349      PT LONG TERM GOAL #1   Title Patient will increase FOTO score to 58 to demonstrate predicted increase in functional mobility to complete ADLs    Baseline 09/18/20 45    Time 8    Period Weeks    Status Deferred      PT LONG TERM GOAL #2   Title Pt will decrease  worst pain as reported on NPRS by at least 3 points in order to demonstrate clinically significant reduction in pain.    Baseline 09/18/20 4/10, 10/16/20 4/10    Time 8    Period Weeks    Status On-going      PT LONG TERM GOAL #3   Title Patient will demonstrate overhead abd and flex to at least 40d and IR to at least L2 in order to complete overhead ADLs and ind dressing/bathing    Baseline 09/18/20 flexL 116 abd 92 IR L PSIS, 10/16/20 flex 121, IR L PSIS, abd 120 (with shoulder elevation)    Time 8    Period Weeks    Status On-going      PT LONG TERM GOAL #4   Title Pt will demonstrate gross 4/5 L shoulder and periscapular strength in order to complete household ADLs    Baseline 09/18/20 10/16/20 5/5 except lower trap 3/3    Time 4    Period Weeks    Status On-going                 Plan - 10/16/20 1428    Clinical Impression Statement PT continued to progress therex in order to increase mobility and strength of L shoulder. Patient continues to demonstrate increased AROM and strength. Patient tolerates all exercises with no increases in symptoms. Patient requires cueing for proper form throughout all sets. PT reassessed patient's progress and patient will continue to beneift from skilled PT intervention to continue increase strength and ROM for functional activities.    Personal Factors and Comorbidities Age;Fitness;Comorbidity 1;Comorbidity 2;Comorbidity 3+;Past/Current Experience;Time since onset of injury/illness/exacerbation;Profession  Comorbidities HTN, DM2, HLD    Examination-Activity Limitations Reach Overhead;Carry;Lift;Bathing;Dressing;Sleep;Transfers    Examination-Participation Restrictions Community Activity;Yard Work;Meal Prep;Laundry;Driving;Occupation    Stability/Clinical Decision Making Evolving/Moderate complexity    Clinical Decision Making Moderate    Rehab Potential Good    PT Frequency 2x / week    PT Duration 8 weeks    PT Treatment/Interventions Moist  Heat;Traction;Dry needling;DME Instruction;Therapeutic exercise;Patient/family education;Manual techniques;Passive range of motion;Joint Manipulations;Spinal Manipulations;ADLs/Self Care Home Management;Cryotherapy;Electrical Stimulation;Ultrasound;Functional mobility training;Neuromuscular re-education;Therapeutic activities;Iontophoresis 4mg /ml Dexamethasone;Contrast Bath;Taping;Scar mobilization    PT Next Visit Plan HEP review, progress therex    PT Home Exercise Plan pulley,  ABD Lynleigh Kovack climbs,  seated AROM with flexion and scaption    Consulted and Agree with Plan of Care Patient           Patient will benefit from skilled therapeutic intervention in order to improve the following deficits and impairments:  Pain,Postural dysfunction,Impaired UE functional use,Impaired flexibility,Increased fascial restricitons,Decreased strength,Decreased coordination,Decreased activity tolerance,Decreased mobility,Hypomobility,Improper body mechanics,Impaired perceived functional ability,Decreased range of motion,Decreased scar mobility  Visit Diagnosis: Acute pain of left shoulder  Stiffness of left shoulder, not elsewhere classified     Problem List Patient Active Problem List   Diagnosis Date Noted  . Aortic atherosclerosis ([place]) 09/26/2020  . S/P reverse total shoulder arthroplasty, left 09/11/2020  . Serous detachment of retinal pigment epithelium of right eye 01/31/2020  . Pseudophakia 01/31/2020  . Exudative age-related macular degeneration of right eye with active choroidal neovascularization ([place]) 01/31/2020  . Situational depression 10/18/2019  . Seizures ([place]) 10/18/2019  . Hyperlipidemia associated with type 2 diabetes mellitus ([place]) 06/19/2019  . Expressive aphasia 06/19/2019  . Vitamin D deficiency 06/19/2019  . Chronic venous insufficiency   . Asthma   . Arthritis   . GERD (gastroesophageal reflux disease)   . Hypogonadism in male   . History of stroke   . H/O right coronary  artery stent placement   . Allergic rhinitis 08/19/2018  . Cervical radiculopathy 08/17/2017  . Advanced care planning/counseling discussion 07/21/2017  . Prostate cancer ([place]) 12/08/2016  . H/O bariatric surgery 07/17/2016  . Hypertension associated with diabetes ([place]) 04/24/2015  . Uncontrolled type 2 diabetes mellitus with hyperglycemia, without long-term current use of insulin ([place]) 04/24/2015  . Chronic sinusitis 08/13/2009  . Coronary atherosclerosis 04/02/2009  . OSA (obstructive sleep apnea) 04/02/2009  . Coronary artery disease 1996     Durwin Reges DPT [address] SPT  Durwin Reges 10/16/2020, 5:50 PM  [place] [facility] [address] [name], [address] Phone: [phone]   Fax:  [phone]  Name: Andrew Cobb MRN: [id number] Date of Birth: [birth year]

## 2020-10-18 ENCOUNTER — Encounter: Payer: PPO | Admitting: Physical Therapy

## 2020-10-23 ENCOUNTER — Ambulatory Visit: Payer: PPO | Admitting: Physical Therapy

## 2020-10-23 ENCOUNTER — Other Ambulatory Visit: Payer: Self-pay

## 2020-10-23 DIAGNOSIS — M25512 Pain in left shoulder: Secondary | ICD-10-CM | POA: Diagnosis not present

## 2020-10-23 DIAGNOSIS — M25612 Stiffness of left shoulder, not elsewhere classified: Secondary | ICD-10-CM

## 2020-10-23 NOTE — Therapy (Signed)
[facility] [address], [name], [address] Phone: [phone]   Fax:  [phone]  Physical Therapy Treatment  Patient Details  Name: Andrew Cobb MRN: [id number] Date of Birth: March 25, 1951 No data recorded  Encounter Date: 10/23/2020   PT End of Session - 10/23/20 1344    Visit Number 7    Number of Visits 17    Date for PT Re-Evaluation 11/16/20    PT Start Time 1301    PT Stop Time 1341    PT Time Calculation (min) 40 min    Activity Tolerance Patient tolerated treatment well    Behavior During Therapy [facility] for tasks assessed/performed           Past Medical History:  Diagnosis Date  . Arthritis   . Asthma    has not needed since 6/21  . Cancer [facility])    prostate  . Chronic venous insufficiency    varicose vein lower extremity with inflammation  . Coronary artery disease 1996   two stents placed   . Diabetes mellitus without complication ([place])    type 2 on metformin  . GERD (gastroesophageal reflux disease)    no issues since gastric bypass surgery as stated per pt  . Hyperlipidemia   . Hypertension   . Hypogonadism in male   . MRSA (methicillin resistant Staphylococcus aureus) infection    07/30/2008 thru 08/07/2008  abdominal abcess  . Sleep apnea    on BIPAP  . Stented coronary artery   . Stroke ([place]) 09/07/2019   no defecits  . Thrombocythemia     Past Surgical History:  Procedure Laterality Date  . ANGIOPLASTY    . ANGIOPLASTY     with stent 04/07/1995  . ANTERIOR CERVICAL DECOMPRESSION/DISCECTOMY FUSION 4 LEVELS N/A 08/17/2017   Procedure: Anterior discectomy with fusion and plate fixation Cervical Three-Four, Four-Five, Five-Six, and Six-Seven Fusion;  Surgeon: Ditty, Kevan Ny, MD;  Location: [facility];  Service: Neurosurgery;  Laterality: N/A;  Anterior discectomy with fusion and plate fixation Cervical Three-Four, Four-Five, Five-Six, and Six-Seven Fusion   . APPENDECTOMY      1966  . BUBBLE STUDY  09/20/2018   Procedure: BUBBLE STUDY;  Surgeon: Josue Hector, MD;  Location: [facility];  Service: Cardiovascular;;  . [place]  . CARDIOVASCULAR STRESS TEST     07/31/2011  . CARPAL TUNNEL RELEASE Left   . CHOLECYSTECTOMY     2006  . colonscopy      08/25/2012  . EYE SURGERY Bilateral    cataract  . FUNCTIONAL ENDOSCOPIC SINUS SURGERY     11/10/2013  . GASTRIC BYPASS     10/05/2012  . HERNIA REPAIR     left inguinal 1981  . INCISION AND DRAINAGE ABSCESS Right 02/26/2017   Procedure: INCISION AND DRAINAGE ABSCESS;  Surgeon: Nickie Retort, MD;  Location: [facility];  Service: Urology;  Laterality: Right;  . JOINT REPLACEMENT     bilateral  . left ankle surgery      05/03/2003   . left carpel tunnel      09/18/1993  . left knee meniscal tear      01/25/2010  . left knee meniscal tear repair      05/04/1996  . left rotator cuff repair      05/03/2003   . LOOP RECORDER INSERTION N/A 09/20/2018   Procedure: LOOP RECORDER INSERTION;  Surgeon: Evans Lance, MD;  Location: [facility] INVASIVE CV  LAB;  Service: Cardiovascular;  Laterality: N/A;  . REPLACEMENT TOTAL KNEE BILATERAL  07/13/2015  . REVERSE SHOULDER ARTHROPLASTY Left 08/30/2020   Procedure: REVERSE SHOULDER ARTHROPLASTY;  Surgeon: Justice Britain, MD;  Location: [facility];  Service: Orthopedics;  Laterality: Left;  136min  . right ankle surgery      fracture has 2 screws 07/07/1997  . right carpel tunnel      05/16/1992  . right shoulder replacement      01/27/2006  . SCROTAL EXPLORATION Right 02/26/2017   Procedure: SCROTUM EXPLORATION;  Surgeon: Nickie Retort, MD;  Location: [facility];  Service: Urology;  Laterality: Right;  . TEE WITHOUT CARDIOVERSION N/A 09/20/2018   Procedure: TRANSESOPHAGEAL ECHOCARDIOGRAM (TEE);  Surgeon: Josue Hector, MD;  Location[facility] ENDOSCOPY;  Service: Cardiovascular;  Laterality: N/A;  . TOTAL KNEE ARTHROPLASTY Left 07/13/2015   Procedure: LEFT  TOTAL KNEE ARTHROPLASTY;  Surgeon: Gaynelle Arabian, MD;  Location: [facility];  Service: Orthopedics;  Laterality: Left;    There were no vitals filed for this visit.   Subjective Assessment - 10/23/20 1303    Subjective Patient reports no pain today but does feel some stiffness today. Patient reports he may be overdoing his exercises both in the pool and HEP. Patient continues to use ice to decrease pain.    Pertinent History Pt is a 70 year male presenting s/p reverse TSA 08/30/20. Patient is left handed. Wore sling for 1 week, and has been out of sling the past few weeks and is completing pendulum exercise and trying to move his shoulder as much as possible. Has follow up scheduled 10/08/20. Reports normal incision site with no drainage or redness. 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 He goes to the [facility] and does water aerobics 5x/day. He has 6 small grandkids that he enjoys visiting. He is able to complete ADLs modI, has shower seat. Pain is limited without medication worst 4/10; best: 2/10; currently 3/10. Pain is anterior and reports is mostly when he sits for too long. Pt denies N/V, B&B changes, unexplained weight fluctuation, saddle paresthesia, fever, night sweats, or unrelenting night pain at this time.    Limitations Writing;House hold activities;Lifting;Reading    How long can you sit comfortably? unlimited    How long can you stand comfortably? unlimited    How long can you walk comfortably? unlimited    Diagnostic tests Xrays post    Patient Stated Goals Get back to water aerobics    Currently in Pain? No/denies          Manual: G1 post glidewithIRand ER for pain throughout motion  STM scar tissue massageto incision  TherEx: warmup: pulley flexion and ABD: 5 minutes, cueing to hold at the topand move arms instead of whole trunk  Reviewed  HEP:    ABD Andrew Cobb climbs1 set of 10 With cueing to hold at the top and keep shoulders square, tactile cues to keep shoulders square to increase stretch at the top of the motion  Seated shoulder flexion with LUE 1 set of 10, 1 set of 10 with 1# DB  Seated shoulder scaption with LUE 1 set of 10  Supine Y with 1# DB bilaterally to increase stretch on Y position- 3 sets of 12, cueing to squeeze shoulder blades down and together and keep thumbs pointed towards mat  Seated middle trap T  With RTB 1 set of 10, 1 set of 15 cueing for scapular retraction and decreased shoulder elevation throughout motion   Supine LUE punches with 1# DB 3 sets of 10, tactile cueing to keep elbow straight and create motion with scapula    Rhythmic stabilization 2 sets of 30 second, minimal pressure for resistance  Towel behind back for IR 2 sets of 15, cueing for good hold at end range and to avoid reaching past back pocket     PT Education - 10/23/20 1305    Education Details therex form, dosing of HEP to limit overuse    Person(s) Educated Patient    Methods Explanation;Demonstration;Tactile cues;Verbal cues    Comprehension Verbalized understanding;Returned demonstration;Verbal cues required;Tactile cues required            PT Short Term Goals - 10/16/20 1431      PT SHORT TERM GOAL #1   Title Pt will be independent with HEP in order to improve strength and decrease pain in order to improve pain-free function at home and work.    Baseline 10/16/20 HEP updated    Time 4    Period Weeks    Status Revised             PT Long Term Goals - 10/16/20 1349      PT LONG TERM GOAL #1   Title Patient will increase FOTO score to 58 to demonstrate predicted increase in functional mobility to complete ADLs    Baseline 09/18/20 45    Time 8    Period Weeks    Status Deferred      PT LONG TERM GOAL #2   Title Pt will decrease worst pain as reported on NPRS by at least 3 points in order to demonstrate  clinically significant reduction in pain.    Baseline 09/18/20 4/10, 10/16/20 4/10    Time 8    Period Weeks    Status On-going      PT LONG TERM GOAL #3   Title Patient will demonstrate overhead abd and flex to at least 40d and IR to at least L2 in order to complete overhead ADLs and ind dressing/bathing    Baseline 09/18/20 flexL 116 abd 92 IR L PSIS, 10/16/20 flex 121, IR L PSIS, abd 120 (with shoulder elevation)    Time 8    Period Weeks    Status On-going      PT LONG TERM GOAL #4   Title Pt will demonstrate gross 4/5 L shoulder and periscapular strength in order to complete household ADLs    Baseline 09/18/20 10/16/20 5/5 except lower trap 3/3    Time 4    Period Weeks    Status On-going                 Plan - 10/23/20 1342    Clinical Impression Statement PT continued to progress therex to increae LUEmobility and strength. Patient continues to demonstrate increase ROM and strength and tolerates all progressions with no increase in pain. Patient requires moderate verbal and tactile cueing for therex form throughout all sets. Patient reported an increase in stiffness today due to overuse and was educated on proper dosing of exercises. PT will continue to progress exerises to patient tolerance.    Personal Factors and Comorbidities Age;Fitness;Comorbidity 1;Comorbidity 2;Comorbidity 3+;Past/Current Experience;Time since onset of injury/illness/exacerbation;Profession    Examination-Activity Limitations Reach Overhead;Carry;Lift;Bathing;Dressing;Sleep;Transfers    Examination-Participation Restrictions Community Activity;Yard Work;Meal Prep;Laundry;Driving;Occupation    Stability/Clinical Decision Making Evolving/Moderate complexity  Clinical Decision Making Moderate    Rehab Potential Good    PT Frequency 2x / week    PT Duration 8 weeks    PT Treatment/Interventions Moist Heat;Traction;Dry needling;DME Instruction;Therapeutic exercise;Patient/family education;Manual  techniques;Passive range of motion;Joint Manipulations;Spinal Manipulations;ADLs/Self Care Home Management;Cryotherapy;Electrical Stimulation;Ultrasound;Functional mobility training;Neuromuscular re-education;Therapeutic activities;Iontophoresis 4mg /ml Dexamethasone;Contrast Bath;Taping;Scar mobilization    PT Next Visit Plan HEP review, progress therex    PT Home Exercise Plan pulley,  ABD Andrew Cobb climbs,  seated AROM with flexion and scaption    Consulted and Agree with Plan of Care Patient           Patient will benefit from skilled therapeutic intervention in order to improve the following deficits and impairments:  Pain,Postural dysfunction,Impaired UE functional use,Impaired flexibility,Increased fascial restricitons,Decreased strength,Decreased coordination,Decreased activity tolerance,Decreased mobility,Hypomobility,Improper body mechanics,Impaired perceived functional ability,Decreased range of motion,Decreased scar mobility  Visit Diagnosis: Acute pain of left shoulder  Stiffness of left shoulder, not elsewhere classified     Problem List Patient Active Problem List   Diagnosis Date Noted  . Aortic atherosclerosis ([place]) 09/26/2020  . S/P reverse total shoulder arthroplasty, left 09/11/2020  . Serous detachment of retinal pigment epithelium of right eye 01/31/2020  . Pseudophakia 01/31/2020  . Exudative age-related macular degeneration of right eye with active choroidal neovascularization ([place]) 01/31/2020  . Situational depression 10/18/2019  . Seizures ([place]) 10/18/2019  . Hyperlipidemia associated with type 2 diabetes mellitus ([place]) 06/19/2019  . Expressive aphasia 06/19/2019  . Vitamin D deficiency 06/19/2019  . Chronic venous insufficiency   . Asthma   . Arthritis   . GERD (gastroesophageal reflux disease)   . Hypogonadism in male   . History of stroke   . H/O right coronary artery stent placement   . Allergic rhinitis 08/19/2018  . Cervical radiculopathy 08/17/2017   . Advanced care planning/counseling discussion 07/21/2017  . Prostate cancer ([place]) 12/08/2016  . H/O bariatric surgery 07/17/2016  . Hypertension associated with diabetes ([place]) 04/24/2015  . Uncontrolled type 2 diabetes mellitus with hyperglycemia, without long-term current use of insulin ([place]) 04/24/2015  . Chronic sinusitis 08/13/2009  . Coronary atherosclerosis 04/02/2009  . OSA (obstructive sleep apnea) 04/02/2009  . Coronary artery disease 1996     Andrew Cobb DPT [address] SPT  Andrew Cobb 10/24/2020, 11:05 AM  [facility] [address], [name], [address] Phone: [phone]   Fax:  [phone]  Name: Andrew Cobb MRN: [id number] Date of Birth: 06/24/1951

## 2020-10-25 ENCOUNTER — Encounter: Payer: PPO | Admitting: Physical Therapy

## 2020-10-30 ENCOUNTER — Other Ambulatory Visit: Payer: Self-pay

## 2020-10-30 ENCOUNTER — Encounter: Payer: Self-pay | Admitting: Physical Therapy

## 2020-10-30 ENCOUNTER — Ambulatory Visit: Payer: PPO | Admitting: Physical Therapy

## 2020-10-30 DIAGNOSIS — M25612 Stiffness of left shoulder, not elsewhere classified: Secondary | ICD-10-CM

## 2020-10-30 DIAGNOSIS — M25512 Pain in left shoulder: Secondary | ICD-10-CM

## 2020-10-30 NOTE — Therapy (Signed)
[facility] [address] [place], [place], [address] Phone: [phone]   Fax:  [phone]  Physical Therapy Treatment/ Discharge Summary  Reporting Period 10/16/20 to 10/30/20  Patient Details  Name: Andrew Cobb MRN: [id number] Date of Birth: 08/11/1950 No data recorded  Encounter Date: 10/30/2020   PT End of Session - 10/30/20 1637    Visit Number 8    Number of Visits 17    Date for PT Re-Evaluation 11/16/20    PT Start Time 1300    PT Stop Time 1340    PT Time Calculation (min) 40 min    Activity Tolerance Patient tolerated treatment well    Behavior During Therapy WFL for tasks assessed/performed           Past Medical History:  Diagnosis Date  . Arthritis   . Asthma    has not needed since 6/21  . Cancer (HCC)    prostate  . Chronic venous insufficiency    varicose vein lower extremity with inflammation  . Coronary artery disease 1996   two stents placed   . Diabetes mellitus without complication (HCC)    type 2 on metformin  . GERD (gastroesophageal reflux disease)    no issues since gastric bypass surgery as stated per pt  . Hyperlipidemia   . Hypertension   . Hypogonadism in male   . MRSA (methicillin resistant Staphylococcus aureus) infection    07/30/2008 thru 08/07/2008  abdominal abcess  . Sleep apnea    on BIPAP  . Stented coronary artery   . Stroke (HCC) 09/07/2019   no defecits  . Thrombocythemia     Past Surgical History:  Procedure Laterality Date  . ANGIOPLASTY    . ANGIOPLASTY     with stent 04/07/1995  . ANTERIOR CERVICAL DECOMPRESSION/DISCECTOMY FUSION 4 LEVELS N/A 08/17/2017   Procedure: Anterior discectomy with fusion and plate fixation Cervical Three-Four, Four-Five, Five-Six, and Six-Seven Fusion;  Surgeon: Ditty, Benjamin Jared, MD;  Location: [facility] OR;  Service: Neurosurgery;  Laterality: N/A;  Anterior discectomy with fusion and plate fixation Cervical Three-Four,  Four-Five, Five-Six, and Six-Seven Fusion   . APPENDECTOMY     1966  . BUBBLE STUDY  09/20/2018   Procedure: BUBBLE STUDY;  Surgeon: Nishan, Peter C, MD;  Location: [facility] ENDOSCOPY;  Service: Cardiovascular;;  . CARDIAC CATHETERIZATION  1996  . CARDIOVASCULAR STRESS TEST     07/31/2011  . CARPAL TUNNEL RELEASE Left   . CHOLECYSTECTOMY     2006  . colonscopy      08/25/2012  . EYE SURGERY Bilateral    cataract  . FUNCTIONAL ENDOSCOPIC SINUS SURGERY     11/10/2013  . GASTRIC BYPASS     10/05/2012  . HERNIA REPAIR     left inguinal 1981  . INCISION AND DRAINAGE ABSCESS Right 02/26/2017   Procedure: INCISION AND DRAINAGE ABSCESS;  Surgeon: Budzyn, Brian James, MD;  Location: [facility] ORS;  Service: Urology;  Laterality: Right;  . JOINT REPLACEMENT     bilateral  . left ankle surgery      05/03/2003   . left carpel tunnel      09/18/1993  . left knee meniscal tear      01/25/2010  . left knee meniscal tear repair      05/04/1996  . left rotator cuff repair      05/03/2003   . LOOP RECORDER INSERTION N/A 09/20/2018   Procedure: LOOP RECORDER INSERTION;  Surgeon: Taylor,   Gregg W, MD;  Location: [facility] IN[facility]ASIVE CV LAB;  Service: Cardiovascular;  Laterality: N/A;  . REPLACEMENT TOTAL KNEE BILATERAL  07/13/2015  . REVERSE SHOULDER ARTHROPLASTY Left 08/30/2020   Procedure: REVERSE SHOULDER ARTHROPLASTY;  Surgeon: Supple, Kevin, MD;  Location: [facility] ORS;  Service: Orthopedics;  Laterality: Left;  120min  . right ankle surgery      fracture has 2 screws 07/07/1997  . right carpel tunnel      05/16/1992  . right shoulder replacement      01/27/2006  . SCROTAL EXPLORATION Right 02/26/2017   Procedure: SCROTUM EXPLORATION;  Surgeon: Budzyn, Brian James, MD;  Location: [facility] ORS;  Service: Urology;  Laterality: Right;  . TEE WITHOUT CARDIOVERSION N/A 09/20/2018   Procedure: TRANSESOPHAGEAL ECHOCARDIOGRAM (TEE);  Surgeon: Nishan, Peter C, MD;  Location: [facility];  Service: Cardiovascular;  Laterality: N/A;  .  TOTAL KNEE ARTHROPLASTY Left 07/13/2015   Procedure: LEFT TOTAL KNEE ARTHROPLASTY;  Surgeon: Frank Aluisio, MD;  Location: [facility] ORS;  Service: Orthopedics;  Laterality: Left;    There were no vitals filed for this visit.   Subjective Assessment - 10/30/20 1304    Subjective Patient reports no shoulder pain today and reports stiffness has decreased since last visit. Patient reports he is able to do his pool exercises without pain and has only had to use ice for about 2 hours this past week.    Pertinent History Pt is a 69 year male presenting s/p reverse TSA 08/30/20. Patient is left handed. Wore sling for 1 week, and has been out of sling the past few weeks and is completing pendulum exercise and trying to move his shoulder as much as possible. Has follow up scheduled 10/08/20. Reports normal incision site with no drainage or redness. 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 He goes to the [employer] and does water aerobics 5x/day. He has 6 small grandkids that he enjoys visiting. He is able to complete ADLs modI, has shower seat. Pain is limited without medication worst 4/10; best: 2/10; currently 3/10. Pain is anterior and reports is mostly when he sits for too long. Pt denies N/V, B&B changes, unexplained weight fluctuation, saddle paresthesia, fever, night sweats, or unrelenting night pain at this time.    Limitations Writing;House hold activities;Lifting;Reading    How long can you sit comfortably? unlimited    How long can you stand comfortably? unlimited    How long can you walk comfortably? unlimited    Diagnostic tests Xrays post    Patient Stated Goals Get back to water aerobics    Currently in Pain? No/denies           Therex:  warmup: pulley flexion and ABD: 5 minutes, cueing to hold at the topand move arms instead of whole trunk  Patient educated on and  demonstrated proper technique for an HEP including the following exercises to maintain mobility and continue increasing shoulder and periscapular strength:  Flexion wall climbs with 5 second hold at the top of motion - 1 set of 10  ABD Wall climbs1 set of 10 With cueing to hold at the top and keep shoulders squared Standing Y against the wall with RTB 1 set of 10  Seated shoulder scaption with RTB 1 set of 10   Patient educated on proper dosing for strengthening versus stretching exercises and the importance of performing HEP   to maintain strength and mobility.   PT Education - 10/30/20 1636    Education Details therex form/technique    Person(s) Educated Patient    Methods Explanation;Demonstration;Verbal cues    Comprehension Verbalized understanding;Verbal cues required;Returned demonstration            PT Short Term Goals - 10/30/20 1335      PT SHORT TERM GOAL #1   Title Pt will be independent with HEP in order to improve strength and decrease pain in order to improve pain-free function at home and work.    Baseline 10/16/20 HEP updated, 10/30/20 - HEP progressed    Time 4    Period Weeks    Status Achieved             PT Long Term Goals - 10/30/20 1309      PT LONG TERM GOAL #1   Title Patient will increase FOTO score to 58 to demonstrate predicted increase in functional mobility to complete ADLs    Baseline 09/18/20 45 10/30/20 79    Time 8    Period Weeks    Status Achieved      PT LONG TERM GOAL #2   Title Pt will decrease worst pain as reported on NPRS by at least 3 points in order to demonstrate clinically significant reduction in pain.    Baseline 09/18/20 4/10, 10/16/20 4/10, 10/30/20 0/10    Time 8    Period Weeks    Status Achieved      PT LONG TERM GOAL #3   Title Patient will demonstrate overhead abd and flex to at least 40d and IR to at least L2 in order to complete overhead ADLs and ind dressing/bathing    Baseline 09/18/20 flexL 116 abd 92 IR L PSIS,  10/16/20 flex 121, IR L PSIS, abd 120 (with shoulder elevation), 10/30/20 flexion = 135, ABD 140, IR L2    Time 8    Period Weeks    Status Partially Met      PT LONG TERM GOAL #4   Title Pt will demonstrate gross 4/5 L shoulder and periscapular strength in order to complete household ADLs    Baseline 09/18/20 10/16/20 5/5 except lower trap 3/3, 10/30/20 4+/4+    Time 4    Status Partially Met                 Plan - 10/30/20 1335    Clinical Impression Statement PT reassessed patient's progress with all goals to determine readiness for discharge. Patient had returned to PLOF without pain and demonstrates decreased stiffness. 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 Patient reports he is ready for discharge. Following goal reassessment, patient demonstrates symmetrical ROM with the LUE compared to the RUE, reports no pain in the past week, and demonstrates increased strength for all shoulder motions and periscapular strength.Patient was educated on an HEP to continue increasing lower trap strength, shoulder strength, and shoulder mobility. Patient was also educated on proper dosing for mobility exercises versus strength exercises. Patient is safe to return to PLOF and discharge from PT having met his goals and demonstrating proper HEP technique to continue improving.    Personal Factors and Comorbidities Age;Fitness;Comorbidity 1;Comorbidity 2;Comorbidity 3+;Past/Current Experience;Time since onset of injury/illness/exacerbation;Profession    Comorbidities HTN, DM2, HLD    Examination-Activity Limitations Reach Overhead;Carry;Lift;Bathing;Dressing;Sleep;Transfers    Examination-Participation Restrictions Community Activity;Yard Work;Meal Prep;Laundry;Driving;Occupation    Stability/Clinical Decision Making Evolving/Moderate complexity  Clinical Decision Making Moderate    Rehab Potential Good    PT Frequency 2x / week    PT Duration  8 weeks    PT Treatment/Interventions Moist Heat;Traction;Dry needling;DME Instruction;Therapeutic exercise;Patient/family education;Manual techniques;Passive range of motion;Joint Manipulations;Spinal Manipulations;ADLs/Self Care Home Management;Cryotherapy;Electrical Stimulation;Ultrasound;Functional mobility training;Neuromuscular re-education;Therapeutic activities;Iontophoresis 11m/ml Dexamethasone;Contrast Bath;Taping;Scar mobilization    PT Next Visit Plan HEP review, progress therex    PT Home Exercise Plan pulley,  ABD Wladyslawa Disbro climbs,  seated AROM with flexion and scaption    Consulted and Agree with Plan of Care Patient           Patient will benefit from skilled therapeutic intervention in order to improve the following deficits and impairments:  Pain,Postural dysfunction,Impaired UE functional use,Impaired flexibility,Increased fascial restricitons,Decreased strength,Decreased coordination,Decreased activity tolerance,Decreased mobility,Hypomobility,Improper body mechanics,Impaired perceived functional ability,Decreased range of motion,Decreased scar mobility  Visit Diagnosis: Acute pain of left shoulder  Stiffness of left shoulder, not elsewhere classified     Problem List Patient Active Problem List   Diagnosis Date Noted  . Aortic atherosclerosis ([facility] 09/26/2020  . S/P reverse total shoulder arthroplasty, left 09/11/2020  . Serous detachment of retinal pigment epithelium of right eye 01/31/2020  . Pseudophakia 01/31/2020  . Exudative age-related macular degeneration of right eye with active choroidal neovascularization ([facility] 01/31/2020  . Situational depression 10/18/2019  . Seizures ([facility] 10/18/2019  . Hyperlipidemia associated with type 2 diabetes mellitus ([place] 06/19/2019  . Expressive aphasia 06/19/2019  . Vitamin D deficiency 06/19/2019  . Chronic venous insufficiency   . Asthma   . Arthritis   . GERD (gastroesophageal reflux disease)   . Hypogonadism in male    . History of stroke   . H/O right coronary artery stent placement   . Allergic rhinitis 08/19/2018  . Cervical radiculopathy 08/17/2017  . Advanced care planning/counseling discussion 07/21/2017  . Prostate cancer ([facility] 12/08/2016  . H/O bariatric surgery 07/17/2016  . Hypertension associated with diabetes ([facility] 04/24/2015  . Uncontrolled type 2 diabetes mellitus with hyperglycemia, without long-term current use of insulin ([facility] 04/24/2015  . Chronic sinusitis 08/13/2009  . Coronary atherosclerosis 04/02/2009  . OSA (obstructive sleep apnea) 04/02/2009  . Coronary artery disease 1996     CDurwin RegesDPT [address] SPT  CDurwin Reges4/26/2022, 4:40 PM  [facility] [address] [place] [id number]: [phone]  Fax:  [phone] Name: Andrew JAIMEMRN: [id number] of Birth: [birth year]

## 2020-11-01 ENCOUNTER — Encounter: Payer: PPO | Admitting: Physical Therapy

## 2020-11-05 ENCOUNTER — Telehealth: Payer: Self-pay | Admitting: Pharmacist

## 2020-11-05 ENCOUNTER — Ambulatory Visit (INDEPENDENT_AMBULATORY_CARE_PROVIDER_SITE_OTHER): Payer: PPO

## 2020-11-05 DIAGNOSIS — I639 Cerebral infarction, unspecified: Secondary | ICD-10-CM

## 2020-11-05 NOTE — Telephone Encounter (Signed)
Patient assistance application for Jardiance faxed to [facility].

## 2020-11-06 LAB — CUP PACEART REMOTE DEVICE CHECK
Date Time Interrogation Session: 20220430031417
Implantable Pulse Generator Implant Date: 20200316

## 2020-11-07 ENCOUNTER — Encounter (INDEPENDENT_AMBULATORY_CARE_PROVIDER_SITE_OTHER): Payer: PPO | Admitting: Ophthalmology

## 2020-11-08 ENCOUNTER — Encounter (INDEPENDENT_AMBULATORY_CARE_PROVIDER_SITE_OTHER): Payer: Self-pay | Admitting: Ophthalmology

## 2020-11-08 ENCOUNTER — Ambulatory Visit (INDEPENDENT_AMBULATORY_CARE_PROVIDER_SITE_OTHER): Payer: PPO | Admitting: Ophthalmology

## 2020-11-08 ENCOUNTER — Other Ambulatory Visit: Payer: Self-pay

## 2020-11-08 DIAGNOSIS — H353211 Exudative age-related macular degeneration, right eye, with active choroidal neovascularization: Secondary | ICD-10-CM

## 2020-11-08 DIAGNOSIS — H35721 Serous detachment of retinal pigment epithelium, right eye: Secondary | ICD-10-CM | POA: Diagnosis not present

## 2020-11-08 MED ORDER — BEVACIZUMAB 2.5 MG/0.1ML IZ SOSY
2.5000 mg | PREFILLED_SYRINGE | INTRAVITREAL | Status: AC | PRN
  Administered 2020-11-08: 2.5 mg via INTRAVITREAL

## 2020-11-08 NOTE — Assessment & Plan Note (Signed)
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  Forms of therapy reviewed include the use of Anti-VEGF medications injected painlessly into the eye, as well as other possible treatment modalities, including thermal laser therapy. Fellow eye involvement and risks were discussed with the patient. Upon the finding of wet age related macular degeneration, treatment will be offered. The treatment regimen is on a treat as needed basis with the intent to treat if necessary and extend interval of exams when possible. On average 1 out of 6 patients do not need lifetime therapy. However, the risk of recurrent disease is high for a lifetime.  Initially monthly, then periodic, examinations and evaluations will determine whether the next treatment is required on the day of the examination.  OD, overall improved.  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  Repeat injection today to maintain at 6-week interval

## 2020-11-08 NOTE — Assessment & Plan Note (Signed)
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  Forms of therapy reviewed include the use of Anti-VEGF medications injected painlessly into the eye, as well as other possible treatment modalities, including thermal laser therapy. Fellow eye involvement and risks were discussed with the patient. Upon the finding of wet age related macular degeneration, treatment will be offered. The treatment regimen is on a treat as needed basis with the intent to treat if necessary and extend interval of exams when possible. On average 1 out of 6 patients do not need lifetime therapy. However, the risk of recurrent disease is high for a lifetime.  Initially monthly, then periodic, examinations and evaluations will determine whether the next treatment is required on the day of the examination. 

## 2020-11-08 NOTE — Progress Notes (Signed)
11/08/2020     CHIEF COMPLAINT Patient presents for Retina Follow Up (6 Wk F/U OD, poss Avastin OD//Pt denies noticeable changes to [place] OU since last visit. Pt denies ocular pain, flashes of light, or floaters OU. //)   HISTORY OF PRESENT ILLNESS: Andrew Cobb is a 70 y.o. male who presents to the clinic today for:   HPI    Retina Follow Up    Diagnosis: Wet AMD   Laterality: right eye   Onset: 6 weeks ago   Severity: mild   Duration: 6 weeks   Course: stable   Comments: 6 Wk F/U OD, poss Avastin OD  Pt denies noticeable changes to [place] OU since last visit. Pt denies ocular pain, flashes of light, or floaters OU.          Last edited by Rockie Neighbours, [place] on 11/08/2020  8:45 AM. (History)      Referring physician: [name], NP [place],  [place] [id number]  HISTORICAL INFORMATION:   Selected notes from the MEDICAL RECORD NUMBER    Lab Results  Component Value Date   HGBA1C 6.7 08/27/2020     CURRENT MEDICATIONS: No current outpatient medications on file. (Ophthalmic Drugs)   No current facility-administered medications for this visit. (Ophthalmic Drugs)   Current Outpatient Medications (Other)  Medication Sig  . acyclovir (ZOVIRAX) 400 MG tablet Take 1 tablet (400 mg total) by mouth 2 (two) times daily.  [name] Kitchen amitriptyline (ELAVIL) 25 MG tablet Take 1 tablet (25 mg total) by mouth at bedtime.  [name] Kitchen aspirin EC 81 MG EC tablet Take 1 tablet (81 mg total) by mouth daily.  [name] Kitchen atorvastatin (LIPITOR) 40 MG tablet Take 1 tablet (40 mg total) by mouth at bedtime.  . betamethasone dipropionate (DIPROLENE) 0.05 % ointment Apply 1 application topically 2 (two) times daily as needed for rash.  . cetirizine (ZYRTEC) 10 MG tablet Take 10 mg by mouth daily.  . Cholecalciferol (VITAMIN D) 2000 units CAPS Take 2,000 Units by mouth daily.  . Cyanocobalamin (B-12) 5000 MCG CAPS Take 5,000 mcg by mouth daily.  . cyclobenzaprine (FLEXERIL) 10 MG tablet Take 1 tablet (10  mg total) by mouth 3 (three) times daily as needed for muscle spasms.  . empagliflozin (JARDIANCE) 10 MG TABS tablet Take 1 tablet (10 mg total) by mouth daily before breakfast.  . Lancets (ONETOUCH ULTRASOFT) lancets 1 each by Other route 2 (two) times daily. Dx E11.9  . levETIRAcetam (KEPPRA) 500 MG tablet Take 1 tablet (500 mg total) by mouth 2 (two) times daily.  . metFORMIN (GLUCOPHAGE) 1000 MG tablet Take 1 tablet (1,000 mg total) by mouth 2 (two) times daily with a meal.  . Multiple Vitamins-Minerals (MULTIVITAMIN PO) Take 1 tablet by mouth daily.   . ondansetron (ZOFRAN) 4 MG tablet Take 1 tablet (4 mg total) by mouth every 8 (eight) hours as needed for nausea or vomiting.  [name] ULTRA test strip USE TWICE DAILY  . oxyCODONE-acetaminophen (PERCOCET) 5-325 MG tablet Take 1 tablet by mouth every 4 (four) hours as needed (max 6 q).  . tamsulosin (FLOMAX) 0.4 MG CAPS capsule Take 1 capsule (0.4 mg total) by mouth at bedtime.  [name] Kitchen telmisartan (MICARDIS) 80 MG tablet Take 0.5 tablets (40 mg total) by mouth daily.   No current facility-administered medications for this visit. (Other)      REVIEW OF SYSTEMS:    ALLERGIES Allergies  Allergen Reactions  . Succinylsulphathiazole Rash  . Sulfamethoxazole-Trimethoprim  Rash  . Tetracyclines & Related Rash    PAST MEDICAL HISTORY Past Medical History:  Diagnosis Date  . Arthritis   . Asthma    has not needed since 6/21  . Cancer [facility])    prostate  . Chronic venous insufficiency    varicose vein lower extremity with inflammation  . Coronary artery disease 1996   two stents placed   . Diabetes mellitus without complication (HCC)    type 2 on metformin  . GERD (gastroesophageal reflux disease)    no issues since gastric bypass surgery as stated per pt  . Hyperlipidemia   . Hypertension   . Hypogonadism in male   . MRSA (methicillin resistant Staphylococcus aureus) infection    07/30/2008 thru 08/07/2008  abdominal abcess  .  Sleep apnea    on BIPAP  . Stented coronary artery   . Stroke (HCC) 09/07/2019   no defecits  . Thrombocythemia    Past Surgical History:  Procedure Laterality Date  . ANGIOPLASTY    . ANGIOPLASTY     with stent 04/07/1995  . ANTERIOR CERVICAL DECOMPRESSION/DISCECTOMY FUSION 4 LEVELS N/A 08/17/2017   Procedure: Anterior discectomy with fusion and plate fixation Cervical Three-Four, Four-Five, Five-Six, and Six-Seven Fusion;  Surgeon: Ditty, Loura Halt, MD;  Location: [facility] OR;  Service: Neurosurgery;  Laterality: N/A;  Anterior discectomy with fusion and plate fixation Cervical Three-Four, Four-Five, Five-Six, and Six-Seven Fusion   . APPENDECTOMY     1966  . BUBBLE STUDY  09/20/2018   Procedure: BUBBLE STUDY;  Surgeon: Wendall Stade, MD;  Location: [facility] ENDOSCOPY;  Service: Cardiovascular;;  . CARDIAC CATHETERIZATION  1996  . CARDIOVASCULAR STRESS TEST     07/31/2011  . CARPAL TUNNEL RELEASE Left   . CHOLECYSTECTOMY     2006  . colonscopy      08/25/2012  . EYE SURGERY Bilateral    cataract  . FUNCTIONAL ENDOSCOPIC SINUS SURGERY     11/10/2013  . GASTRIC BYPASS     10/05/2012  . HERNIA REPAIR     left inguinal 1981  . INCISION AND DRAINAGE ABSCESS Right 02/26/2017   Procedure: INCISION AND DRAINAGE ABSCESS;  Surgeon: Hildred Laser, MD;  Location: [facility] ORS;  Service: Urology;  Laterality: Right;  . JOINT REPLACEMENT     bilateral  . left ankle surgery      05/03/2003   . left carpel tunnel      09/18/1993  . left knee meniscal tear      01/25/2010  . left knee meniscal tear repair      05/04/1996  . left rotator cuff repair      05/03/2003   . LOOP RECORDER INSERTION N/A 09/20/2018   Procedure: LOOP RECORDER INSERTION;  Surgeon: Marinus Maw, MD;  Location: [facility] INVASIVE CV LAB;  Service: Cardiovascular;  Laterality: N/A;  . REPLACEMENT TOTAL KNEE BILATERAL  07/13/2015  . REVERSE SHOULDER ARTHROPLASTY Left 08/30/2020   Procedure: REVERSE SHOULDER ARTHROPLASTY;   Surgeon: Francena Hanly, MD;  Location: [facility] ORS;  Service: Orthopedics;  Laterality: Left;   . right ankle surgery      fracture has 2 screws 07/07/1997  . right carpel tunnel      05/16/1992  . right shoulder replacement      01/27/2006  . SCROTAL EXPLORATION Right 02/26/2017   Procedure: SCROTUM EXPLORATION;  Surgeon: Hildred Laser, MD;  Location: [facility] ORS;  Service: Urology;  Laterality: Right;  . TEE WITHOUT CARDIOVERSION N/A 09/20/2018  Procedure: TRANSESOPHAGEAL ECHOCARDIOGRAM (TEE);  Surgeon: Josue Hector, MD;  Location: [facility] ENDOSCOPY;  Service: Cardiovascular;  Laterality: N/A;  . TOTAL KNEE ARTHROPLASTY Left 07/13/2015   Procedure: LEFT TOTAL KNEE ARTHROPLASTY;  Surgeon: Gaynelle Arabian, MD;  Location: [facility] ORS;  Service: Orthopedics;  Laterality: Left;    FAMILY HISTORY Family History  Problem Relation Age of Onset  . Cancer Mother        pancreatic  . Diabetes Mother   . Stroke Mother   . Heart disease Mother   . Hyperlipidemia Mother   . Hypertension Mother   . Heart attack Mother   . Heart disease Father   . Stroke Father   . Diabetes Father   . Hypertension Father   . Heart attack Father   . Hyperlipidemia Father   . Pancreatic cancer Father   . Cancer Sister   . Cancer Brother        lung  . Cancer Brother   . Kidney cancer Neg Hx   . Bladder Cancer Neg Hx   . Prostate cancer Neg Hx     SOCIAL HISTORY Social History   Tobacco Use  . Smoking status: Former Smoker    Packs/day: 1.00    Years: 10.00    Pack years: 10.00    Types: Cigarettes    Quit date: 07/07/1984    Years since quitting: 36.3  . Smokeless tobacco: Never Used  . Tobacco comment: quit 1986  Vaping Use  . Vaping Use: Never used  Substance Use Topics  . Alcohol use: No    Alcohol/week: 0.0 standard drinks  . Drug use: No         OPHTHALMIC EXAM: Base Eye Exam    Visual Acuity (ETDRS)      Right Left   Dist [place] 20/30 +2 20/20 -2   Dist ph [place] NI    Correction: Glasses        Tonometry (Tonopen, 8:49 AM)      Right Left   Pressure 12 13       Pupils      Pupils Dark Light Shape React APD   Right PERRL 4 3 Round Brisk None   Left PERRL 4 3 Round Brisk None       Visual Fields (Counting fingers)      Left Right    Full Full       Extraocular Movement      Right Left    Full Full       Neuro/Psych    Oriented x3: Yes   Mood/Affect: Normal       Dilation    Right eye: 1.0% Mydriacyl, 2.5% Phenylephrine @ 8:49 AM        Slit Lamp and Fundus Exam    External Exam      Right Left   External Normal Normal       Slit Lamp Exam      Right Left   Lids/Lashes Normal Normal   Conjunctiva/Sclera White and quiet White and quiet   Cornea Clear Clear   Anterior Chamber Deep and quiet Deep and quiet   Iris Round and reactive Round and reactive   Lens Posterior chamber intraocular lens Posterior chamber intraocular lens   Anterior Vitreous Normal Normal       Fundus Exam      Right Left   Posterior Vitreous Posterior vitreous detachment    Disc Normal    C/D Ratio 0.6    Macula Hard  drusen, no exudates, Macular thickening, Soft drusen, Pigmented atrophy, Retinal pigment epithelial mottling, Retinal pigment epithelial detachment    Vessels Normal, , no DR    Periphery Normal           IMAGING AND PROCEDURES  Imaging and Procedures for 11/08/20  OCT, Retina - OU - Both Eyes       Right Eye Quality was good. Scan locations included subfoveal. Central Foveal Thickness: 346. Progression has been stable. Findings include abnormal foveal contour, subretinal fluid, pigment epithelial detachment, no IRF.   Left Eye Quality was good. Scan locations included subfoveal. Central Foveal Thickness: 289. Progression has been stable. Findings include retinal drusen , abnormal foveal contour, no SRF, no IRF.   Notes Today OD at 6 week interval, small change with increased serous retinal detachment adjacent to the vascularized pigment epithelial  detachment nasal to the FAZ.  We will repeat injection today Avastin and examination again in 6 weeks        Intravitreal Injection, Pharmacologic Agent - OD - Right Eye       Time Out 11/08/2020. 10:08 AM. Confirmed correct patient, procedure, site, and patient consented.   Anesthesia Topical anesthesia was used. Anesthetic medications included [place] 3.5%.   Procedure Preparation included Tobramycin 0.3%, 10% betadine to eyelids, 5% betadine to ocular surface. A 30 gauge needle was used.   Injection:  2.5 mg Bevacizumab (AVASTIN) 2.5mg /0.27mL SOSY   NDC: 14481-856-31, Lot: [id number]   Route: Intravitreal, Site: Right Eye  Post-op Post injection exam found visual acuity of at least counting fingers. The patient tolerated the procedure well. There were no complications. The patient received written and verbal post procedure care education. Post injection medications were not given.                 ASSESSMENT/PLAN:  Serous detachment of retinal pigment epithelium of right eye The nature of wet macular degeneration was discussed with the patient.  Forms of therapy reviewed include the use of Anti-VEGF medications injected painlessly into the eye, as well as other possible treatment modalities, including thermal laser therapy. Fellow eye involvement and risks were discussed with the patient. Upon the finding of wet age related macular degeneration, treatment will be offered. The treatment regimen is on a treat as needed basis with the intent to treat if necessary and extend interval of exams when possible. On average 1 out of 6 patients do not need lifetime therapy. However, the risk of recurrent disease is high for a lifetime.  Initially monthly, then periodic, examinations and evaluations will determine whether the next treatment is required on the day of the examination.  Exudative age-related macular degeneration of right eye with active choroidal neovascularization (HCC) The nature  of wet macular degeneration was discussed with the patient.  Forms of therapy reviewed include the use of Anti-VEGF medications injected painlessly into the eye, as well as other possible treatment modalities, including thermal laser therapy. Fellow eye involvement and risks were discussed with the patient. Upon the finding of wet age related macular degeneration, treatment will be offered. The treatment regimen is on a treat as needed basis with the intent to treat if necessary and extend interval of exams when possible. On average 1 out of 6 patients do not need lifetime therapy. However, the risk of recurrent disease is high for a lifetime.  Initially monthly, then periodic, examinations and evaluations will determine whether the next treatment is required on the day of the examination.  OD, overall improved.  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  Repeat injection today to maintain at 6-week interval      ICD-10-CM   1. Exudative age-related macular degeneration of right eye with active choroidal neovascularization (HCC)  H35.3211 OCT, Retina - OU - Both Eyes    Intravitreal Injection, Pharmacologic Agent - OD - Right Eye    bevacizumab (AVASTIN) SOSY 2.5 mg  2. Serous detachment of retinal pigment epithelium of right eye  H35.721     1.  OD, repeat intravitreal Avastin today to maintain and prevent progression of CNVM from vascularized PED.  2.  Dilate OD next in 6 weeks  3.  Ophthalmic Meds Ordered this visit:  Meds ordered this encounter  Medications  . bevacizumab (AVASTIN) SOSY 2.5 mg       Return in about 6 weeks (around 12/20/2020) for dilate, OD, AVASTIN OCT.  There are no Patient Instructions on file for this visit.   Explained the diagnoses, plan, and follow up with the patient and they expressed understanding.  Patient expressed understanding of the importance of proper follow up care.   [name] Chrishawn Boley  M.D. Diseases & Surgery of the Retina and Vitreous Retina & Diabetic [place] 11/08/20     Abbreviations: M myopia (nearsighted); A astigmatism; H hyperopia (farsighted); P presbyopia; Mrx spectacle prescription;  CTL contact lenses; OD right eye; OS left eye; OU both eyes  XT exotropia; ET esotropia; PEK punctate epithelial keratitis; PEE punctate epithelial erosions; DES dry eye syndrome; MGD meibomian gland dysfunction; ATs artificial tears; PFAT's preservative free artificial tears; [place] nuclear sclerotic cataract; PSC posterior subcapsular cataract; ERM epi-retinal membrane; PVD posterior vitreous detachment; RD retinal detachment; DM diabetes mellitus; DR diabetic retinopathy; NPDR non-proliferative diabetic retinopathy; PDR proliferative diabetic retinopathy; CSME clinically significant macular edema; DME diabetic macular edema; dbh dot blot hemorrhages; CWS cotton wool spot; POAG primary open angle glaucoma; C/D cup-to-disc ratio; HVF humphrey visual field; GVF goldmann visual field; OCT optical coherence tomography; IOP intraocular pressure; BRVO Branch retinal vein occlusion; CRVO central retinal vein occlusion; CRAO central retinal artery occlusion; BRAO branch retinal artery occlusion; RT retinal tear; SB scleral buckle; PPV pars plana vitrectomy; VH Vitreous hemorrhage; PRP panretinal laser photocoagulation; IVK intravitreal kenalog; VMT vitreomacular traction; MH Macular hole;  NVD neovascularization of the disc; NVE neovascularization elsewhere; AREDS age related eye disease study; ARMD age related macular degeneration; POAG primary open angle glaucoma; EBMD epithelial/anterior basement membrane dystrophy; ACIOL anterior chamber intraocular lens; IOL intraocular lens; PCIOL posterior chamber intraocular lens; Phaco/IOL phacoemulsification with intraocular lens placement; [place] photorefractive keratectomy; LASIK laser assisted in situ keratomileusis; HTN hypertension; DM diabetes mellitus; COPD  chronic obstructive pulmonary disease

## 2020-11-12 ENCOUNTER — Emergency Department: Payer: PPO

## 2020-11-12 ENCOUNTER — Emergency Department
Admission: EM | Admit: 2020-11-12 | Discharge: 2020-11-12 | Disposition: A | Discharge status: Home / Self Care | Payer: PPO | Attending: Emergency Medicine | Admitting: Emergency Medicine

## 2020-11-12 ENCOUNTER — Telehealth: Payer: Self-pay | Admitting: Pharmacist

## 2020-11-12 ENCOUNTER — Encounter: Payer: Self-pay | Admitting: *Deleted

## 2020-11-12 ENCOUNTER — Other Ambulatory Visit: Payer: Self-pay

## 2020-11-12 DIAGNOSIS — J45909 Unspecified asthma, uncomplicated: Secondary | ICD-10-CM | POA: Diagnosis not present

## 2020-11-12 DIAGNOSIS — I10 Essential (primary) hypertension: Secondary | ICD-10-CM | POA: Insufficient documentation

## 2020-11-12 DIAGNOSIS — R42 Dizziness and giddiness: Secondary | ICD-10-CM | POA: Diagnosis not present

## 2020-11-12 DIAGNOSIS — Z96612 Presence of left artificial shoulder joint: Secondary | ICD-10-CM | POA: Insufficient documentation

## 2020-11-12 DIAGNOSIS — Z794 Long term (current) use of insulin: Secondary | ICD-10-CM | POA: Insufficient documentation

## 2020-11-12 DIAGNOSIS — Z7982 Long term (current) use of aspirin: Secondary | ICD-10-CM | POA: Diagnosis not present

## 2020-11-12 DIAGNOSIS — I2510 Atherosclerotic heart disease of native coronary artery without angina pectoris: Secondary | ICD-10-CM | POA: Insufficient documentation

## 2020-11-12 DIAGNOSIS — Z96653 Presence of artificial knee joint, bilateral: Secondary | ICD-10-CM | POA: Insufficient documentation

## 2020-11-12 DIAGNOSIS — E785 Hyperlipidemia, unspecified: Secondary | ICD-10-CM | POA: Insufficient documentation

## 2020-11-12 DIAGNOSIS — Z87891 Personal history of nicotine dependence: Secondary | ICD-10-CM | POA: Insufficient documentation

## 2020-11-12 DIAGNOSIS — E1169 Type 2 diabetes mellitus with other specified complication: Secondary | ICD-10-CM | POA: Diagnosis not present

## 2020-11-12 DIAGNOSIS — Z8546 Personal history of malignant neoplasm of prostate: Secondary | ICD-10-CM | POA: Diagnosis not present

## 2020-11-12 DIAGNOSIS — R55 Syncope and collapse: Secondary | ICD-10-CM | POA: Insufficient documentation

## 2020-11-12 DIAGNOSIS — Z79899 Other long term (current) drug therapy: Secondary | ICD-10-CM | POA: Diagnosis not present

## 2020-11-12 DIAGNOSIS — Z7984 Long term (current) use of oral hypoglycemic drugs: Secondary | ICD-10-CM | POA: Diagnosis not present

## 2020-11-12 DIAGNOSIS — Z96611 Presence of right artificial shoulder joint: Secondary | ICD-10-CM | POA: Diagnosis not present

## 2020-11-12 LAB — BASIC METABOLIC PANEL
Anion gap: 13 (ref 5–15)
BUN: 32 mg/dL — ABNORMAL HIGH (ref 8–23)
CO2: 20 mmol/L — ABNORMAL LOW (ref 22–32)
Calcium: 8.9 mg/dL (ref 8.9–10.3)
Chloride: 104 mmol/L (ref 98–111)
Creatinine, Ser: 1.46 mg/dL — ABNORMAL HIGH (ref 0.61–1.24)
GFR, Estimated: 52 mL/min — ABNORMAL LOW (ref >60)
Glucose, Bld: 211 mg/dL — ABNORMAL HIGH (ref 70–99)
Potassium: 4.6 mmol/L (ref 3.5–5.1)
Sodium: 137 mmol/L (ref 135–145)

## 2020-11-12 LAB — CBC
HCT: 40 % (ref 39.0–52.0)
Hemoglobin: 12.5 g/dL — ABNORMAL LOW (ref 13.0–17.0)
MCH: 31 pg (ref 26.0–34.0)
MCHC: 31.3 g/dL (ref 30.0–36.0)
MCV: 99.3 fL (ref 80.0–100.0)
Platelets: 167 10*3/uL (ref 150–400)
RBC: 4.03 MIL/uL — ABNORMAL LOW (ref 4.22–5.81)
RDW: 13 % (ref 11.5–15.5)
WBC: 8 10*3/uL (ref 4.0–10.5)
nRBC: 0 % (ref 0.0–0.2)

## 2020-11-12 LAB — TROPONIN I (HIGH SENSITIVITY): Troponin I (High Sensitivity): 8 ng/L (ref <18)

## 2020-11-12 MED ORDER — SODIUM CHLORIDE 0.9 % IV BOLUS
1000.0000 mL | Freq: Once | INTRAVENOUS | Status: AC
  Administered 2020-11-12: 1000 mL via INTRAVENOUS

## 2020-11-12 NOTE — ED Notes (Signed)
No ct scan at this time per dr z Tamala Julian

## 2020-11-12 NOTE — Chronic Care Management (AMB) (Signed)
Chronic Care Management Pharmacy Assistant   Name: Andrew Cobb  MRN: [id number] DOB: 05-24-1951   Reason for Encounter: Disease State Diabetes Mellitus    Recent office visits:  None noted  Recent consult visits:  11/08/2020 Deloria Lair, MD (Ophthamology) Retina follow up. Injected bevacizumab (AVASTIN) SOSY 2.5 mg. Follow up in 6 weeks. 10/30/2020 Durwin Reges (PT)-Physical therapy for acute pain of left shoulder. 10/23/2020 Chelsea Pterterson (PT)-Physical therapy for acute pain of left shoulder. 10/16/2020 Chelsea Pterson (PT)-Physical therapy for acute pain of left shoulder. 10/09/2020 Chelsea Pterson (PT)-Physical therapy for acute pain of left shoulder.   Hospital visits:  None in previous 6 months  Medications: Outpatient Encounter Medications as of 11/12/2020  Medication Sig Note  . acyclovir (ZOVIRAX) 400 MG tablet Take 1 tablet (400 mg total) by mouth 2 (two) times daily.   [name] Kitchen amitriptyline (ELAVIL) 25 MG tablet Take 1 tablet (25 mg total) by mouth at bedtime.   [name] Kitchen aspirin EC 81 MG EC tablet Take 1 tablet (81 mg total) by mouth daily. 08/13/2020: On hold for procedure    . atorvastatin (LIPITOR) 40 MG tablet Take 1 tablet (40 mg total) by mouth at bedtime.   . betamethasone dipropionate (DIPROLENE) 0.05 % ointment Apply 1 application topically 2 (two) times daily as needed for rash.   . cetirizine (ZYRTEC) 10 MG tablet Take 10 mg by mouth daily.   . Cholecalciferol (VITAMIN D) 2000 units CAPS Take 2,000 Units by mouth daily.   . Cyanocobalamin (B-12) 5000 MCG CAPS Take 5,000 mcg by mouth daily.   . cyclobenzaprine (FLEXERIL) 10 MG tablet Take 1 tablet (10 mg total) by mouth 3 (three) times daily as needed for muscle spasms.   . empagliflozin (JARDIANCE) 10 MG TABS tablet Take 1 tablet (10 mg total) by mouth daily before breakfast.   . Lancets (ONETOUCH ULTRASOFT) lancets 1 each by Other route 2 (two) times daily. Dx E11.9   . levETIRAcetam (KEPPRA) 500 MG  tablet Take 1 tablet (500 mg total) by mouth 2 (two) times daily.   . metFORMIN (GLUCOPHAGE) 1000 MG tablet Take 1 tablet (1,000 mg total) by mouth 2 (two) times daily with a meal. 08/13/2020: Pt plans to switch from 500 to 1000 mg when he runs out of 500 mg tabs  . Multiple Vitamins-Minerals (MULTIVITAMIN PO) Take 1 tablet by mouth daily.    . ondansetron (ZOFRAN) 4 MG tablet Take 1 tablet (4 mg total) by mouth every 8 (eight) hours as needed for nausea or vomiting.   [name] ULTRA test strip USE TWICE DAILY   . oxyCODONE-acetaminophen (PERCOCET) 5-325 MG tablet Take 1 tablet by mouth every 4 (four) hours as needed (max 6 q).   . tamsulosin (FLOMAX) 0.4 MG CAPS capsule Take 1 capsule (0.4 mg total) by mouth at bedtime.   [name] Kitchen telmisartan (MICARDIS) 80 MG tablet Take 0.5 tablets (40 mg total) by mouth daily.    No facility-administered encounter medications on file as of 11/12/2020.   Recent Relevant Labs: Lab Results  Component Value Date/Time   HGBA1C 6.7 08/27/2020 10:52 AM   HGBA1C 6.3 (H) 07/11/2020 05:22 AM   HGBA1C 6.6 07/02/2020 08:49 AM   MICROALBUR 10 12/15/2019 01:26 PM   MICROALBUR 30 (H) 09/19/2019 08:41 AM    Kidney Function Lab Results  Component Value Date/Time   CREATININE 1.26 08/27/2020 10:54 AM   CREATININE 1.03 08/22/2020 08:54 AM   CREATININE 0.99 07/29/2011 11:02 AM   GFR 49.88 (L) 10/14/2019 10:25  AM   GFRNONAA 58 (L) 08/27/2020 10:54 AM   GFRNONAA >60 08/22/2020 08:54 AM   GFRNONAA >60 07/29/2011 11:02 AM   GFRAA 67 08/27/2020 10:54 AM   GFRAA >60 07/29/2011 11:02 AM    . Current antihyperglycemic regimen:  o Jardiance 1 tablet daily before breakfast o Metformin 1000 mg 1 tablet twice daily  . What recent interventions/DTPs have been made to improve glycemic control:  o None noted  . Have there been any recent hospitalizations or ED visits since last visit with CPP? No   . Patient denies hypoglycemic symptoms, including Pale, Sweaty, Shaky, Hungry,  Nervous/irritable and Vision changes   . Patient denies hyperglycemic symptoms, including blurry vision, excessive thirst, fatigue, polyuria and weakness   . How often are you checking your blood sugar? once daily   . What are your blood sugars ranging?  o Fasting: 110-120 o Before meals:  o After meals:  o Bedtime:  . During the week, how often does your blood glucose drop below 70? Never   . Are you checking your feet daily/regularly?   Patient states he checks his feet daily.  Adherence Review: Is the patient currently on a STATIN medication? Yes Is the patient currently on ACE/ARB medication? Yes Does the patient have >5 day gap between last estimated fill dates? Yes    Star Rating Drugs: Atorvastatin 40 mg Last filled:10/11/2020 90 DS Jardiance 10 mg Last filled: Not noted Metformin 1000 mg Last filled:11/06/2020 90 DS Telmisartan 80 mg Last filled:05/17/2020 90 DS  [facility] Clinical Pharmacist Assistant [phone]

## 2020-11-12 NOTE — ED Notes (Signed)
Pt transported to CT at this time.

## 2020-11-12 NOTE — ED Provider Notes (Signed)
[facility] Emergency Department Provider Note   ____________________________________________   Event Date/Time   First MD Initiated Contact with Patient 11/12/20 1608     (approximate)  I have reviewed the triage vital signs and the nursing notes.   HISTORY  Chief Complaint No chief complaint on file.    HPI Andrew Cobb is a 70 y.o. male with the below who reported past medical history who presents for episodes of near syncopal events today as he was in the [facility] attempting to exercise.  Patient states that he has had similar episodes in the past and is concerned due to family members that have had "mini strokes" and have just felt dizzy similar to this.  Patient also has a history of symptomatic bradycardia as well as postural orthostatic hypotension.  Patient denies a postural component to these symptoms but does endorse exertion.  Patient denies any complaints at this time.  Patient currently denies any vision changes, tinnitus, difficulty speaking, facial droop, sore throat, chest pain, shortness of breath, abdominal pain, nausea/vomiting/diarrhea, dysuria, or weakness/numbness/paresthesias in any extremity         Past Medical History:  Diagnosis Date  . Arthritis   . Asthma    has not needed since 6/21  . Cancer [facility])    prostate  . Chronic venous insufficiency    varicose vein lower extremity with inflammation  . Coronary artery disease 1996   two stents placed   . Diabetes mellitus without complication ([place])    type 2 on metformin  . GERD (gastroesophageal reflux disease)    no issues since gastric bypass surgery as stated per pt  . Hyperlipidemia   . Hypertension   . Hypogonadism in male   . MRSA (methicillin resistant Staphylococcus aureus) infection    07/30/2008 thru 08/07/2008  abdominal abcess  . Sleep apnea    on BIPAP  . Stented coronary artery   . Stroke ([place]) 09/07/2019   no defecits  . Thrombocythemia     Patient  Active Problem List   Diagnosis Date Noted  . Aortic atherosclerosis ([place]) 09/26/2020  . S/P reverse total shoulder arthroplasty, left 09/11/2020  . Serous detachment of retinal pigment epithelium of right eye 01/31/2020  . Pseudophakia 01/31/2020  . Exudative age-related macular degeneration of right eye with active choroidal neovascularization ([place]) 01/31/2020  . Situational depression 10/18/2019  . Seizures ([place]) 10/18/2019  . Hyperlipidemia associated with type 2 diabetes mellitus ([place]) 06/19/2019  . Expressive aphasia 06/19/2019  . Vitamin D deficiency 06/19/2019  . Chronic venous insufficiency   . Asthma   . Arthritis   . GERD (gastroesophageal reflux disease)   . Hypogonadism in male   . History of stroke   . H/O right coronary artery stent placement   . Allergic rhinitis 08/19/2018  . Cervical radiculopathy 08/17/2017  . Advanced care planning/counseling discussion 07/21/2017  . Prostate cancer ([place]) 12/08/2016  . H/O bariatric surgery 07/17/2016  . Hypertension associated with diabetes ([place]) 04/24/2015  . Uncontrolled type 2 diabetes mellitus with hyperglycemia, without long-term current use of insulin ([place]) 04/24/2015  . Chronic sinusitis 08/13/2009  . Coronary atherosclerosis 04/02/2009  . OSA (obstructive sleep apnea) 04/02/2009  . Coronary artery disease 1996    Past Surgical History:  Procedure Laterality Date  . ANGIOPLASTY    . ANGIOPLASTY     with stent 04/07/1995  . ANTERIOR CERVICAL DECOMPRESSION/DISCECTOMY FUSION 4 LEVELS N/A 08/17/2017   Procedure: Anterior discectomy with fusion and plate fixation Cervical Three-Four,  Four-Five, Five-Six, and Six-Seven Fusion;  Surgeon: Ditty, Kevan Ny, MD;  Location: [place];  Service: Neurosurgery;  Laterality: N/A;  Anterior discectomy with fusion and plate fixation Cervical Three-Four, Four-Five, Five-Six, and Six-Seven Fusion   . APPENDECTOMY     1966  . BUBBLE STUDY  09/20/2018   Procedure: BUBBLE STUDY;   Surgeon: Josue Hector, MD;  Location: [place];  Service: Cardiovascular;;  . [place]  . CARDIOVASCULAR STRESS TEST     07/31/2011  . CARPAL TUNNEL RELEASE Left   . CHOLECYSTECTOMY     2006  . colonscopy      08/25/2012  . EYE SURGERY Bilateral    cataract  . FUNCTIONAL ENDOSCOPIC SINUS SURGERY     11/10/2013  . GASTRIC BYPASS     10/05/2012  . HERNIA REPAIR     left inguinal 1981  . INCISION AND DRAINAGE ABSCESS Right 02/26/2017   Procedure: INCISION AND DRAINAGE ABSCESS;  Surgeon: Nickie Retort, MD;  Location: [facility];  Service: Urology;  Laterality: Right;  . JOINT REPLACEMENT     bilateral  . left ankle surgery      05/03/2003   . left carpel tunnel      09/18/1993  . left knee meniscal tear      01/25/2010  . left knee meniscal tear repair      05/04/1996  . left rotator cuff repair      05/03/2003   . LOOP RECORDER INSERTION N/A 09/20/2018   Procedure: LOOP RECORDER INSERTION;  Surgeon: Evans Lance, MD;  Location: [place] [facility];  Service: Cardiovascular;  Laterality: N/A;  . REPLACEMENT TOTAL KNEE BILATERAL  07/13/2015  . REVERSE SHOULDER ARTHROPLASTY Left 08/30/2020   Procedure: REVERSE SHOULDER ARTHROPLASTY;  Surgeon: Justice Britain, MD;  Location: [facility];  Service: Orthopedics;  Laterality: Left;  142min  . right ankle surgery      fracture has 2 screws 07/07/1997  . right carpel tunnel      05/16/1992  . right shoulder replacement      01/27/2006  . SCROTAL EXPLORATION Right 02/26/2017   Procedure: SCROTUM EXPLORATION;  Surgeon: Nickie Retort, MD;  Location: [facility];  Service: Urology;  Laterality: Right;  . TEE WITHOUT CARDIOVERSION N/A 09/20/2018   Procedure: TRANSESOPHAGEAL ECHOCARDIOGRAM (TEE);  Surgeon: Josue Hector, MD;  Location[facility];  Service: Cardiovascular;  Laterality: N/A;  . TOTAL KNEE ARTHROPLASTY Left 07/13/2015   Procedure: LEFT TOTAL KNEE ARTHROPLASTY;  Surgeon: Gaynelle Arabian, MD;  Location: [facility];  Service: Orthopedics;  Laterality: Left;    Prior to Admission medications   Medication Sig Start Date End Date Taking? Authorizing Provider  acyclovir (ZOVIRAX) 400 MG tablet Take 1 tablet (400 mg total) by mouth 2 (two) times daily. 07/05/20   [name], Henrine Screws T, NP  amitriptyline (ELAVIL) 25 MG tablet Take 1 tablet (25 mg total) by mouth at bedtime. 03/20/20   Marnee Guarneri T, NP  aspirin EC 81 MG EC tablet Take 1 tablet (81 mg total) by mouth daily. 02/11/19   Debbe Odea, MD  atorvastatin (LIPITOR) 40 MG tablet Take 1 tablet (40 mg total) by mouth at bedtime. 03/20/20   [name], Henrine Screws T, NP  betamethasone dipropionate (DIPROLENE) 0.05 % ointment Apply 1 application topically 2 (two) times daily as needed for rash. 08/09/20   [provider]  cetirizine (ZYRTEC) 10 MG tablet Take 10 mg by mouth daily.    [provider]  Cholecalciferol (VITAMIN  D) 2000 units CAPS Take 2,000 Units by mouth daily.    [provider]  Cyanocobalamin (B-12) 5000 MCG CAPS Take 5,000 mcg by mouth daily.    [provider]  cyclobenzaprine (FLEXERIL) 10 MG tablet Take 1 tablet (10 mg total) by mouth 3 (three) times daily as needed for muscle spasms. 08/30/20   Shuford, Olivia Mackie, PA-C  empagliflozin (JARDIANCE) 10 MG TABS tablet Take 1 tablet (10 mg total) by mouth daily before breakfast. 08/23/20   [name], Henrine Screws T, NP  Lancets (ONETOUCH ULTRASOFT) lancets 1 each by Other route 2 (two) times daily. Dx E11.9 10/04/19   Volney American, PA-C  levETIRAcetam (KEPPRA) 500 MG tablet Take 1 tablet (500 mg total) by mouth 2 (two) times daily. 05/09/20   [name], NP  metFORMIN (GLUCOPHAGE) 1000 MG tablet Take 1 tablet (1,000 mg total) by mouth 2 (two) times daily with a meal. 07/02/20   [name], Henrine Screws T, NP  Multiple Vitamins-Minerals (MULTIVITAMIN PO) Take 1 tablet by mouth daily.     [provider]  ondansetron (ZOFRAN) 4 MG tablet Take 1 tablet (4 mg total) by  mouth every 8 (eight) hours as needed for nausea or vomiting. 08/30/20   Shuford, Olivia Mackie, PA-C  ONETOUCH ULTRA test strip USE TWICE DAILY 01/13/20   Park Liter P, DO  oxyCODONE-acetaminophen (PERCOCET) 5-325 MG tablet Take 1 tablet by mouth every 4 (four) hours as needed (max 6 q). 08/30/20   Shuford, Olivia Mackie, PA-C  tamsulosin (FLOMAX) 0.4 MG CAPS capsule Take 1 capsule (0.4 mg total) by mouth at bedtime. 07/02/20   [name], Henrine Screws T, NP  telmisartan (MICARDIS) 80 MG tablet Take 0.5 tablets (40 mg total) by mouth daily. 07/02/20   Marnee Guarneri T, NP    Allergies Succinylsulphathiazole, Sulfamethoxazole-trimethoprim, and Tetracyclines & related  Family History  Problem Relation Age of Onset  . Cancer Mother        pancreatic  . Diabetes Mother   . Stroke Mother   . Heart disease Mother   . Hyperlipidemia Mother   . Hypertension Mother   . Heart attack Mother   . Heart disease Father   . Stroke Father   . Diabetes Father   . Hypertension Father   . Heart attack Father   . Hyperlipidemia Father   . Pancreatic cancer Father   . Cancer Sister   . Cancer Brother        lung  . Cancer Brother   . Kidney cancer Neg Hx   . Bladder Cancer Neg Hx   . Prostate cancer Neg Hx     Social History Social History   Tobacco Use  . Smoking status: Former Smoker    Packs/day: 1.00    Years: 10.00    Pack years: 10.00    Types: Cigarettes    Quit date: 07/07/1984    Years since quitting: 36.3  . Smokeless tobacco: Never Used  . Tobacco comment: quit 1986  Vaping Use  . Vaping Use: Never used  Substance Use Topics  . Alcohol use: No    Alcohol/week: 0.0 standard drinks  . Drug use: No    Review of Systems Constitutional: No fever/chills Eyes: No visual changes. ENT: No sore throat. Cardiovascular: Denies chest pain. Respiratory: Denies shortness of breath. Gastrointestinal: No abdominal pain.  No nausea, no vomiting.  No diarrhea. Genitourinary: Negative for  dysuria. Musculoskeletal: Negative for acute arthralgias Skin: Negative for rash. Neurological: Negative for headaches, weakness/numbness/paresthesias in any extremity Psychiatric: Negative for suicidal  ideation/homicidal ideation   ____________________________________________   PHYSICAL EXAM:  VITAL SIGNS: ED Triage Vitals  Enc Vitals Group     BP 11/12/20 1536 (!) 178/148     Pulse Rate 11/12/20 1536 (!) 105     Resp 11/12/20 1536 20     Temp 11/12/20 1536 98.6 F (37 C)     Temp Source 11/12/20 1536 Oral     SpO2 11/12/20 1536 96 %     Weight 11/12/20 1537 200 lb (90.7 kg)     Height 11/12/20 1537 5\' 10"  (1.778 m)     Head Circumference --      Peak Flow --      Pain Score 11/12/20 1536 8     Pain Loc --      Pain Edu? --      Excl. in [place]? --    Constitutional: Alert and oriented. Well appearing and in no acute distress. Eyes: Conjunctivae are normal. PERRL. Head: Atraumatic. Nose: No congestion/rhinnorhea. Mouth/Throat: Mucous membranes are moist. Neck: No stridor Cardiovascular: Grossly normal heart sounds.  Good peripheral circulation. Respiratory: Normal respiratory effort.  No retractions. Gastrointestinal: Soft and nontender. No distention. Musculoskeletal: No obvious deformities Neurologic:  Normal speech and language. No gross focal neurologic deficits are appreciated. Skin:  Skin is warm and dry. No rash noted. Psychiatric: Mood and affect are normal. Speech and behavior are normal.  ____________________________________________   LABS (all labs ordered are listed, but only abnormal results are displayed)  Labs Reviewed  BASIC METABOLIC PANEL - Abnormal; Notable for the following components:      Result Value   CO2 20 (*)    Glucose, Bld 211 (*)    BUN 32 (*)    Creatinine, Ser 1.46 (*)    GFR, Estimated 52 (*)    All other components within normal limits  CBC - Abnormal; Notable for the following components:   RBC 4.03 (*)    Hemoglobin 12.5  (*)    All other components within normal limits  TROPONIN I (HIGH SENSITIVITY)  TROPONIN I (HIGH SENSITIVITY)   ____________________________________________  EKG  ED ECG REPORT I, Naaman Plummer, the attending physician, personally viewed and interpreted this ECG.  Date: 11/12/2020 EKG Time: 1544 Rate: 99 Rhythm: normal sinus rhythm QRS Axis: normal Intervals: normal ST/T Wave abnormalities: normal Narrative Interpretation: no evidence of acute ischemia  ____________________________________________  RADIOLOGY  ED MD interpretation: 2 view x-ray of the chest shows a left-sided pleural effusion that is likely associated with atelectasis  CT of the head without contrast shows no evidence of acute intracranial abnormalities.  That does redemonstrate multifocal chronic cortical/subcortical infarcts within the left cerebral hemisphere  Official radiology report(s): DG Chest 2 View  Result Date: 11/12/2020 CLINICAL DATA:  Recent syncopal episode EXAM: CHEST - 2 VIEW COMPARISON:  07/10/2020 FINDINGS: Cardiac shadow is stable. Lungs are well aerated bilaterally. Small left-sided pleural effusion and likely underlying atelectasis is noted. Loop recorder is seen. Postsurgical changes are noted in the cervical spine and shoulders bilaterally. IMPRESSION: Left-sided pleural effusion and likely associated atelectasis. Electronically Signed   By: Inez Catalina M.D.   On: 11/12/2020 16:22   CT Head Wo Contrast  Result Date: 11/12/2020 CLINICAL DATA:  Dizziness, nonspecific. Additional history provided: Patient reports near syncope x 2 today. Patient reports fall last week, left-sided rib pain, history of stroke x 4. History of prostate cancer. EXAM: CT HEAD WITHOUT CONTRAST TECHNIQUE: Contiguous axial images were obtained from the base of the skull  through the vertex without intravenous contrast. COMPARISON:  Brain MRI 09/07/2019.  Head CT 09/07/2019. FINDINGS: Brain: Mild cerebral and cerebellar  atrophy. Redemonstrated chronic cortical/subcortical infarcts affecting portions of the left frontal, parietal, occipital and temporal lobes. Background advanced patchy and ill-defined hypoattenuation within the cerebral white matter, nonspecific but compatible with chronic small vessel ischemic disease. There is no acute intracranial hemorrhage. No acute demarcated cortical infarct. No extra-axial fluid collection. No evidence of intracranial mass. No midline shift. Vascular: No hyperdense vessel.  Atherosclerotic calcifications Skull: Normal. Negative for fracture or focal lesion. Sinuses/Orbits: Visualized orbits show no acute finding. Trace bilateral ethmoid sinus mucosal thickening at the imaged levels. IMPRESSION: No evidence of acute intracranial abnormality. Redemonstrated multifocal chronic cortical/subcortical infarcts within the left cerebral hemisphere. Stable background mild generalized parenchymal atrophy and severe cerebral white matter chronic small vessel ischemic disease. Electronically Signed   By: Kellie Simmering DO   On: 11/12/2020 17:28    ____________________________________________   PROCEDURES  Procedure(s) performed (including Critical Care):  .1-3 Lead EKG Interpretation Performed by: Naaman Plummer, MD Authorized by: Naaman Plummer, MD     Interpretation: normal     ECG rate:  73   ECG rate assessment: normal     Rhythm: sinus rhythm     Ectopy: none     Conduction: normal       ____________________________________________   INITIAL IMPRESSION / ASSESSMENT AND PLAN / ED COURSE  As part of my medical decision making, I reviewed the following data within the [facility] notes reviewed and incorporated, Labs reviewed, EKG interpreted, Old chart reviewed, Radiograph reviewed and Notes from prior ED visits reviewed and incorporated      Patient presents with complaints of syncope/presyncope ED Workup:  CBC, BMP, Troponin, BNP, ECG,  CXR Differential diagnosis includes HF, ICH, seizure, stroke, HOCM, ACS, aortic dissection, malignant arrhythmia, or GI bleed. Findings: No evidence of acute laboratory abnormalities.  Troponin negative x1 EKG: No e/o STEMI. No evidence of Brugadas sign, delta wave, epsilon wave, significantly prolonged QTc, or malignant arrhythmia. Chest x-ray is significant for left-sided pleural effusion likely related to atelectasis.  At further discussion with the patient, he had a fall approximately 1 week ago with persistent left-sided pain.  Patient explained the importance of deep inspiration and possibly using an incentive spirometer that he states already has at home to attempt to prevent any further atelectasis. Disposition: Discharge. Patient is at baseline at this time. Return precautions expressed and understood in person. Advised follow up with primary care provider or clinic physician in next 24 hours.      ____________________________________________   FINAL CLINICAL IMPRESSION(S) / ED DIAGNOSES  Final diagnoses:  Postural dizziness with presyncope     ED Discharge Orders    None       Note:  This document was prepared using Dragon voice recognition software and may include unintentional dictation errors.   Naaman Plummer, MD 11/12/20 1816

## 2020-11-12 NOTE — ED Triage Notes (Signed)
Pt to triage via wheelchair.  Pt reports near syncopal episodes x 2 today.  No chest pain or sob.  No headache.  Speech clear.  Pt alert.  Pt reports last week falling [facility] the [facility] and has left side rib pain.

## 2020-11-15 NOTE — Progress Notes (Signed)
Electrophysiology Office Note Date: 11/16/2020  ID:  Andrew Cobb, Andrew Cobb [birth year], MRN [id number]  PCP: [name], NP Primary Cardiologist: None Electrophysiologist: Cristopher Peru, MD   CC: ILR follow-up  Andrew Cobb is a 70 y.o. male seen today for Dr. Lovena Le . he presents today for post ED visit follow up. Pt seen after near syncopal event with fall at the [facility] earlier this week. Labwork and CT unremarkable. He states this happens from time to time where he gets lightheadedness and near-syncope. Denies frank syncope. Last significant episode was nearly a year ago. Denies recent illness, denies chest pain, palpitations, dyspnea, PND, orthopnea, nausea, vomiting, edema, weight gain, or early satiety.  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 Arthritis   . Asthma    has not needed since 6/21  . Cancer [facility])    prostate  . Chronic venous insufficiency    varicose vein lower extremity with inflammation  . Coronary artery disease 1996   two stents placed   . Diabetes mellitus without complication ([place])    type 2 on metformin  . GERD (gastroesophageal reflux disease)    no issues since gastric bypass surgery as stated per pt  . Hyperlipidemia   . Hypertension   . Hypogonadism in male   . MRSA (methicillin resistant Staphylococcus aureus) infection    07/30/2008 thru 08/07/2008  abdominal abcess  . Sleep apnea    on BIPAP  . Stented coronary artery   . Stroke ([place]) 09/07/2019   no defecits  . Thrombocythemia    Past Surgical History:  Procedure Laterality Date  . ANGIOPLASTY    . ANGIOPLASTY     with stent 04/07/1995  . ANTERIOR CERVICAL DECOMPRESSION/DISCECTOMY FUSION 4 LEVELS N/A 08/17/2017   Procedure: Anterior discectomy with fusion and plate fixation Cervical Three-Four, Four-Five, Five-Six, and Six-Seven Fusion;  Surgeon: Ditty, Kevan Ny, MD;  Location: [place];   Service: Neurosurgery;  Laterality: N/A;  Anterior discectomy with fusion and plate fixation Cervical Three-Four, Four-Five, Five-Six, and Six-Seven Fusion   . APPENDECTOMY     1966  . BUBBLE STUDY  09/20/2018   Procedure: BUBBLE STUDY;  Surgeon: Josue Hector, MD;  Location: [place];  Service: Cardiovascular;;  . [place]  . CARDIOVASCULAR STRESS TEST     07/31/2011  . CARPAL TUNNEL RELEASE Left   . CHOLECYSTECTOMY     2006  . colonscopy      08/25/2012  . EYE SURGERY Bilateral    cataract  . FUNCTIONAL ENDOSCOPIC SINUS SURGERY     11/10/2013  . GASTRIC BYPASS     10/05/2012  . HERNIA REPAIR     left inguinal 1981  . INCISION AND DRAINAGE ABSCESS Right 02/26/2017   Procedure: INCISION AND DRAINAGE ABSCESS;  Surgeon: Nickie Retort, MD;  Location: [facility]S;  Service: Urology;  Laterality: Right;  . JOINT REPLACEMENT     bilateral  . left ankle surgery      05/03/2003   . left carpel tunnel      09/18/1993  . left knee meniscal tear      01/25/2010  . left knee meniscal tear repair      05/04/1996  . left rotator cuff repair      05/03/2003   . LOOP RECORDER INSERTION N/A 09/20/2018   Procedure: LOOP RECORDER INSERTION;  Surgeon: Evans Lance, MD;  Location: [facility] INVASIVE CV  LAB;  Service: Cardiovascular;  Laterality: N/A;  . REPLACEMENT TOTAL KNEE BILATERAL  07/13/2015  . REVERSE SHOULDER ARTHROPLASTY Left 08/30/2020   Procedure: REVERSE SHOULDER ARTHROPLASTY;  Surgeon: Justice Britain, MD;  Location: [facility];  Service: Orthopedics;  Laterality: Left;  156min  . right ankle surgery      fracture has 2 screws 07/07/1997  . right carpel tunnel      05/16/1992  . right shoulder replacement      01/27/2006  . SCROTAL EXPLORATION Right 02/26/2017   Procedure: SCROTUM EXPLORATION;  Surgeon: Nickie Retort, MD;  Location: [facility];  Service: Urology;  Laterality: Right;  . TEE WITHOUT CARDIOVERSION N/A 09/20/2018   Procedure: TRANSESOPHAGEAL  ECHOCARDIOGRAM (TEE);  Surgeon: Josue Hector, MD;  Location: [facility] ENDOSCOPY;  Service: Cardiovascular;  Laterality: N/A;  . TOTAL KNEE ARTHROPLASTY Left 07/13/2015   Procedure: LEFT TOTAL KNEE ARTHROPLASTY;  Surgeon: Gaynelle Arabian, MD;  Location: [facility];  Service: Orthopedics;  Laterality: Left;    Current Outpatient Medications  Medication Sig Dispense Refill  . acyclovir (ZOVIRAX) 400 MG tablet Take 1 tablet (400 mg total) by mouth 2 (two) times daily. 180 tablet 4  . amitriptyline (ELAVIL) 25 MG tablet Take 1 tablet (25 mg total) by mouth at bedtime. 90 tablet 4  . aspirin EC 81 MG EC tablet Take 1 tablet (81 mg total) by mouth daily. 30 tablet 0  . atorvastatin (LIPITOR) 40 MG tablet Take 1 tablet (40 mg total) by mouth at bedtime. 90 tablet 4  . betamethasone dipropionate (DIPROLENE) 0.05 % ointment Apply 1 application topically 2 (two) times daily as needed for rash.    . cetirizine (ZYRTEC) 10 MG tablet Take 10 mg by mouth daily.    . Cholecalciferol (VITAMIN D) 2000 units CAPS Take 2,000 Units by mouth daily.    . Cyanocobalamin (B-12) 5000 MCG CAPS Take 5,000 mcg by mouth daily.    . cyclobenzaprine (FLEXERIL) 10 MG tablet Take 1 tablet (10 mg total) by mouth 3 (three) times daily as needed for muscle spasms. 30 tablet 1  . empagliflozin (JARDIANCE) 10 MG TABS tablet Take 1 tablet (10 mg total) by mouth daily before breakfast. 90 tablet 3  . Lancets (ONETOUCH ULTRASOFT) lancets 1 each by Other route 2 (two) times daily. Dx E11.9 100 each 11  . levETIRAcetam (KEPPRA) 500 MG tablet Take 1 tablet (500 mg total) by mouth 2 (two) times daily. 180 tablet 3  . metFORMIN (GLUCOPHAGE) 1000 MG tablet Take 1 tablet (1,000 mg total) by mouth 2 (two) times daily with a meal. 180 tablet 4  . Multiple Vitamins-Minerals (MULTIVITAMIN PO) Take 1 tablet by mouth daily.     . ondansetron (ZOFRAN) 4 MG tablet Take 1 tablet (4 mg total) by mouth every 8 (eight) hours as needed for nausea or vomiting. 10  tablet 0  . ONETOUCH ULTRA test strip USE TWICE DAILY 100 each 11  . tamsulosin (FLOMAX) 0.4 MG CAPS capsule Take 1 capsule (0.4 mg total) by mouth at bedtime. 90 capsule 4  . telmisartan (MICARDIS) 80 MG tablet Take 0.5 tablets (40 mg total) by mouth daily. 45 tablet 4   No current facility-administered medications for this visit.    Allergies:   Succinylsulphathiazole, Sulfamethoxazole-trimethoprim, and Tetracyclines & related   Social History: Social History   Socioeconomic History  . Marital status: Married    Spouse name: Not on file  . Number of children: Not on file  . Years of education: Not on  file  . Highest education level: Some college, no degree  Occupational History    Comment: drives for [employer]   Tobacco Use  . Smoking status: Former Smoker    Packs/day: 1.00    Years: 10.00    Pack years: 10.00    Types: Cigarettes    Quit date: 07/07/1984    Years since quitting: 36.3  . Smokeless tobacco: Never Used  . Tobacco comment: quit 1986  Vaping Use  . Vaping Use: Never used  Substance and Sexual Activity  . Alcohol use: No    Alcohol/week: 0.0 standard drinks  . Drug use: No  . Sexual activity: Yes  Other Topics Concern  . Not on file  Social History Narrative  . Not on file   Social Determinants of Health   Financial Resource Strain: Low Risk   . Difficulty of Paying Living Expenses: Not hard at all  Food Insecurity: No Food Insecurity  . Worried About Charity fundraiser in the Last Year: Never true  . Ran Out of Food in the Last Year: Never true  Transportation Needs: No Transportation Needs  . Lack of Transportation (Medical): No  . Lack of Transportation (Non-Medical): No  Physical Activity: Sufficiently Active  . Days of Exercise per Week: 5 days  . Minutes of Exercise per Session: 40 min  Stress: No Stress Concern Present  . Feeling of Stress : Not at all  Social Connections: Socially Integrated  . Frequency of Communication with Friends and  Family: More than three times a week  . Frequency of Social Gatherings with Friends and Family: More than three times a week  . Attends Religious Services: More than 4 times per year  . Active Member of Clubs or Organizations: Yes  . Attends Archivist Meetings: More than 4 times per year  . Marital Status: Married  Human resources officer Violence: Not on file    Family History: Family History  Problem Relation Age of Onset  . Cancer Mother        pancreatic  . Diabetes Mother   . Stroke Mother   . Heart disease Mother   . Hyperlipidemia Mother   . Hypertension Mother   . Heart attack Mother   . Heart disease Father   . Stroke Father   . Diabetes Father   . Hypertension Father   . Heart attack Father   . Hyperlipidemia Father   . Pancreatic cancer Father   . Cancer Sister   . Cancer Brother        lung  . Cancer Brother   . Kidney cancer Neg Hx   . Bladder Cancer Neg Hx   . Prostate cancer Neg Hx      Review of Systems: All other systems reviewed and are otherwise negative except as noted above.  Physical Exam: Vitals:   11/16/20 0854  BP: 110/70  Pulse: 88  SpO2: 95%  Weight: 210 lb (95.3 kg)  Height: 5\' 10"  (1.778 m)     GEN- The patient is well appearing, alert and oriented x 3 today.   HEENT: normocephalic, atraumatic; sclera clear, conjunctiva pink; hearing intact; oropharynx clear; neck supple  Lungs- Clear to ausculation bilaterally, normal work of breathing.  No wheezes, rales, rhonchi Heart- Regular rate and rhythm, no murmurs, rubs or gallops  GI- soft, non-tender, non-distended, bowel sounds present  Extremities- no clubbing, cyanosis, or edema  MS- no significant deformity or atrophy Skin- warm and dry, no rash or lesion; PPM  pocket well healed Psych- euthymic mood, full affect Neuro- strength and sensation are intact  PPM Interrogation- reviewed in detail today,  See PACEART report  EKG:  EKG is not ordered today.  Recent  Labs: 03/20/2020: TSH 1.920 07/13/2020: Magnesium 1.8 08/27/2020: ALT 30 11/12/2020: BUN 32; Creatinine, Ser 1.46; Hemoglobin 12.5; Platelets 167; Potassium 4.6; Sodium 137   Wt Readings from Last 3 Encounters:  11/16/20 210 lb (95.3 kg)  11/12/20 200 lb (90.7 kg)  08/30/20 199 lb 15.3 oz (90.7 kg)     Other studies Reviewed: Additional studies/ records that were reviewed today include: Previous EP office notes. Most recent remote. ED visit notes.  Assessment and Plan:  1. Lightheadedness -> Orthostatic hypotension Orthostatic today with BP dropping from 110/64 to <90 palp on initial check. Improved to 92/58 after 2-3 minutes. Encouraged hydration and mild liberalization of salt intake, especially when overheated or exercising.  Recommended thigh sleeves. Thigh circumference measured at 16" and written down for patient.  Not on diuretics at this time.  Monitor on flomax. Have recommend he follow up with neurology if persists despite conservative treatment.   2. CAD No s/s ischemia Continue ASA and statin  3. HTN Continue current medications  4. Cryptogenic stroke No true AF noted on ILR.  5. HLD Per PCP  Current medicines are reviewed at length with the patient today.   The patient does not have concerns regarding his medicines.  The following changes were made today:  none  Labs/ tests ordered today include:  Recent ED labwork and testing reviewed.   Disposition:   Follow up with EP APP in 6 Months. Sooner +/- neuro follow up if symptoms do not resolve.     Jacalyn Lefevre, PA-C  11/16/2020 9:22 AM  [facility] [address] [place] [address] [phone] (office) [phone] (fax)

## 2020-11-16 ENCOUNTER — Encounter: Payer: Self-pay | Admitting: Student

## 2020-11-16 ENCOUNTER — Other Ambulatory Visit: Payer: Self-pay

## 2020-11-16 ENCOUNTER — Ambulatory Visit (INDEPENDENT_AMBULATORY_CARE_PROVIDER_SITE_OTHER): Payer: PPO | Admitting: Student

## 2020-11-16 VITALS — BP 110/70 | HR 88 | Ht 70.0 in | Wt 210.0 lb

## 2020-11-16 DIAGNOSIS — I10 Essential (primary) hypertension: Secondary | ICD-10-CM | POA: Diagnosis not present

## 2020-11-16 DIAGNOSIS — I639 Cerebral infarction, unspecified: Secondary | ICD-10-CM | POA: Diagnosis not present

## 2020-11-16 DIAGNOSIS — I2510 Atherosclerotic heart disease of native coronary artery without angina pectoris: Secondary | ICD-10-CM

## 2020-11-16 DIAGNOSIS — R55 Syncope and collapse: Secondary | ICD-10-CM

## 2020-11-16 DIAGNOSIS — Z4509 Encounter for adjustment and management of other cardiac device: Secondary | ICD-10-CM | POA: Diagnosis not present

## 2020-11-16 LAB — CUP PACEART INCLINIC DEVICE CHECK
Date Time Interrogation Session: 20220513092551
Implantable Pulse Generator Implant Date: 20200316

## 2020-11-16 NOTE — Patient Instructions (Signed)
Medication Instructions:  Your physician recommends that you continue on your current medications as directed. Please refer to the Current Medication list given to you today.  *If you need a refill on your cardiac medications before your next appointment, please call your pharmacy*   Lab Work: None If you have labs (blood work) drawn today and your tests are completely normal, you will receive your results only by: [name] Kitchen MyChart Message (if you have MyChart) OR . A paper copy in the mail If you have any lab test that is abnormal or we need to change your treatment, we will call you to review the results.  Follow-Up: At [facility], you and your health needs are our priority.  As part of our continuing mission to provide you with exceptional heart care, we have created designated Provider Care Teams.  These Care Teams include your primary Cardiologist (physician) and Advanced Practice Providers (APPs -  Physician Assistants and Nurse Practitioners) who all work together to provide you with the care you need, when you need it.   Your next appointment:   6 month(s)  The format for your next appointment:   In Person  Provider:   You may see Cristopher Peru, MD or one of the following Advanced Practice Providers on your designated Care Team:     Legrand Como "Jonni Sanger" [place], [place]

## 2020-11-21 DIAGNOSIS — Z96612 Presence of left artificial shoulder joint: Secondary | ICD-10-CM | POA: Diagnosis not present

## 2020-11-21 DIAGNOSIS — Z471 Aftercare following joint replacement surgery: Secondary | ICD-10-CM | POA: Diagnosis not present

## 2020-11-22 ENCOUNTER — Other Ambulatory Visit: Payer: Self-pay | Admitting: Family Medicine

## 2020-11-22 NOTE — Telephone Encounter (Signed)
Pt has apt on 11/23/2020

## 2020-11-22 NOTE — Telephone Encounter (Signed)
Requested medication (s) are due for refill today: Yes  Requested medication (s) are on the active medication list: Yes  Last refill:  10/04/19  Future visit scheduled: Yes  Notes to clinic:  Prescription has expired.    Requested Prescriptions  Pending Prescriptions Disp Refills   Lancets (ONETOUCH ULTRASOFT) lancets [Pharmacy Med Name: ONETOUCH ULTRA LAN MIS] 100 each 0    Sig: USE 1  TO [place] DAILY      Endocrinology: Diabetes - Testing Supplies Passed - 11/22/2020 10:03 AM      Passed - Valid encounter within last 12 months    Recent Outpatient Visits           2 months ago Preoperative clearance   [facility] [place], Jolene T, NP   4 months ago Uncontrolled type 2 diabetes mellitus with hyperglycemia, without long-term current use of insulin ([place])   [place], Jolene T, NP   8 months ago Uncontrolled type 2 diabetes mellitus with hyperglycemia, without long-term current use of insulin ([place])   [place], Jolene T, NP   11 months ago Uncontrolled type 2 diabetes mellitus with hyperglycemia, without long-term current use of insulin ([place])   [place], [place] T, NP   1 year ago Uncontrolled type 2 diabetes mellitus with hyperglycemia, without long-term current use of insulin ([place])   [place], [name], NP       Future Appointments             Tomorrow [name], NP [place], [facility]   In 8 months  [employer], [facility]

## 2020-11-23 ENCOUNTER — Other Ambulatory Visit: Payer: Self-pay

## 2020-11-23 ENCOUNTER — Encounter: Payer: Self-pay | Admitting: Nurse Practitioner

## 2020-11-23 ENCOUNTER — Ambulatory Visit (INDEPENDENT_AMBULATORY_CARE_PROVIDER_SITE_OTHER): Payer: PPO | Admitting: Nurse Practitioner

## 2020-11-23 VITALS — BP 117/70 | HR 69 | Temp 97.5°F | Wt 207.8 lb

## 2020-11-23 DIAGNOSIS — E1159 Type 2 diabetes mellitus with other circulatory complications: Secondary | ICD-10-CM | POA: Diagnosis not present

## 2020-11-23 DIAGNOSIS — I152 Hypertension secondary to endocrine disorders: Secondary | ICD-10-CM | POA: Diagnosis not present

## 2020-11-23 DIAGNOSIS — D649 Anemia, unspecified: Secondary | ICD-10-CM

## 2020-11-23 DIAGNOSIS — E785 Hyperlipidemia, unspecified: Secondary | ICD-10-CM

## 2020-11-23 DIAGNOSIS — I700 Atherosclerosis of aorta: Secondary | ICD-10-CM | POA: Diagnosis not present

## 2020-11-23 DIAGNOSIS — E1165 Type 2 diabetes mellitus with hyperglycemia: Secondary | ICD-10-CM

## 2020-11-23 DIAGNOSIS — E559 Vitamin D deficiency, unspecified: Secondary | ICD-10-CM

## 2020-11-23 DIAGNOSIS — Z8546 Personal history of malignant neoplasm of prostate: Secondary | ICD-10-CM | POA: Diagnosis not present

## 2020-11-23 DIAGNOSIS — E1169 Type 2 diabetes mellitus with other specified complication: Secondary | ICD-10-CM | POA: Diagnosis not present

## 2020-11-23 DIAGNOSIS — I2510 Atherosclerotic heart disease of native coronary artery without angina pectoris: Secondary | ICD-10-CM | POA: Diagnosis not present

## 2020-11-23 DIAGNOSIS — G4733 Obstructive sleep apnea (adult) (pediatric): Secondary | ICD-10-CM

## 2020-11-23 DIAGNOSIS — R569 Unspecified convulsions: Secondary | ICD-10-CM | POA: Diagnosis not present

## 2020-11-23 DIAGNOSIS — H353211 Exudative age-related macular degeneration, right eye, with active choroidal neovascularization: Secondary | ICD-10-CM

## 2020-11-23 LAB — MICROALBUMIN, URINE WAIVED
Creatinine, Urine Waived: 200 mg/dL (ref 10–300)
Microalb, Ur Waived: 30 mg/L — ABNORMAL HIGH (ref 0–19)
Microalb/Creat Ratio: <30 mg/g (ref <30)

## 2020-11-23 LAB — BAYER DCA HB A1C WAIVED: HB A1C (BAYER DCA - WAIVED): 6.8 % (ref <7.0)

## 2020-11-23 MED ORDER — TELMISARTAN 80 MG PO TABS: 40.0000 mg | ORAL_TABLET | Freq: Every day | ORAL | 4 refills | Status: DC

## 2020-11-23 NOTE — Assessment & Plan Note (Signed)
Continue collaboration with ophthalmology. 

## 2020-11-23 NOTE — Assessment & Plan Note (Addendum)
Chronic, ongoing.  Continue ASA and maintaining tight lipid control, may need to increase Atorvastatin to 80 MG if ever elevation, recent LDL in 40's.  Continue current Atorvastatin dosing.  Lipid check today.  Refills sent in as needed.

## 2020-11-23 NOTE — Assessment & Plan Note (Signed)
Chronic, improved on recent labs.  Continue daily supplement and adjust as needed. 

## 2020-11-23 NOTE — Assessment & Plan Note (Addendum)
Currently no treatment, continue collaboration with oncology and urology.  Recent notes reviewed. 

## 2020-11-23 NOTE — Assessment & Plan Note (Signed)
Continue 100% adherence to CPAP use.

## 2020-11-23 NOTE — Assessment & Plan Note (Signed)
Noted on imaging 07/11/20 and previous imaging.  Recommend continue daily statin therapy and ASA for prevention. 

## 2020-11-23 NOTE — Patient Instructions (Signed)

## 2020-11-23 NOTE — Assessment & Plan Note (Signed)
Chronic, ongoing with A1C today 6.8% (upward trend), plus urine ALB 30 and A:C <30 today -- continue Telmisartan for kidney protection. Neuro goal <6.5%, recommend he take medication daily and focus heavily on diet.  Continue  Metformin 1000 MG BID and continue Jardiance 10 MG, will increase Jardiance if A1C elevation noted next visit.  Recommend he check BS twice daily + continue focus on regular exercise and diet.  Return in 3 months for A1C check.

## 2020-11-23 NOTE — Progress Notes (Signed)
BP 117/70   Pulse 69   Temp (!) 97.5 F (36.4 C) (Oral)   Wt 207 lb 12.8 oz (94.3 kg)   SpO2 97%   BMI 29.82 kg/m    Subjective:    Patient ID: Andrew Cobb, male    DOB: 1951-04-10, 70 y.o.   MRN: [id number]  HPI: Andrew Cobb is a 69 y.o. male  Chief Complaint  Patient presents with  . Hospitalization Follow-up    Patient states he is here following up for lab work.   . Medication Refill    Patient states he would like to discuss refills with providers.     ER FOLLOW UP Recently seen in ER for syncopal episode on 11/12/20, he was concerned for stroke and went to ER.  The episode presented while at [facility] exercising.  Has a history of symptomatic bradycardia as well as postural orthostatic hypotension. EKG in ER was reassuring.  CT head noted no acute findings.  He endorsed in ER having a fall one week prior and had some ongoing mild left-sided pain, but no rib fractures on CXR.  CBC showed mild low HGB at 12.5.  Has loop recorder and was told everything was good during his episode.  Has had no further episodes -- mowed three yards a couple days ago without issue. Time since discharge: 11/12/20 Hospital/facility: [facility] Diagnosis: syncopal episode Procedures/tests: CT scan, CXR, EKG, labs Consultants: none New medications: none Discharge instructions: Follow-up with PCP Status: better  DIABETES Last A1C inFebruary 6.7%, which is continued to trend down from beginning of year 9.2%. He continues on Metformin 1000 MG BID and Jardiance 10 MG.  Has history of bariatric surgery 8 years ago.  Followed by ophthalmology for macular degeneration -- saw last 11/08/20. Hypoglycemic episodes:no Polydipsia/polyuria:no Visual disturbance:no Chest pain:no Paresthesias:no Glucose Monitoring:yes Accucheck frequency: Daily Fasting glucose:100 this morning, averages around this Post prandial: Evening: Before  meals: Taking Insulin?:no Long acting insulin: Short acting insulin: Blood Pressure Monitoring:daily Retinal Examination:Up to Date Foot Exam:Up to Date Pneumovax:Up To Date Influenza:Up to Date Aspirin:yes  HYPERTENSION / HYPERLIPIDEMIA Continues Telmisartan 40 MG and Atorvastatin 40 MG daily. Followed by neurology due to history of stroke events x 3. Currently has loop recorder in place. 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 Saw cardiology last 11/16/20 -- they recommended he wear compression hose due to orthostatic changes.  Was noted to have aortic atherosclerosis on imaging 07/11/20. Satisfied with current treatment?yes Duration of hypertension:chronic BP monitoring frequency:daily BP range:110 -120/70-80 BP medication side effects:no Duration of hyperlipidemia:chronic Cholesterol medication side effects:no Cholesterol supplements: none Medication compliance:good compliance Aspirin:yes Recent stressors:no Recurrent headaches:no Visual changes:no Palpitations:no Dyspnea:yes Chest pain:no Lower extremity edema:no Dizzy/lightheaded:no The ASCVD Risk score [name], et al., 2013) failed to calculate for the following reasons:   The patient has a prior MI or stroke diagnosis  HISTORY OF PROSTATE CA: Previously followed by Dr. Bernardo Heater. Last seen 11/24/2019 and went through IMRT for 40 days, reports he is tolerated this well.  Last treatment was on 02/02/2020.  Saw Dr. Baruch Gouty last 03/08/20 -- returns to see him next week.  SEIZURES: Seen by neurology on 10/01/20 and recommendation to continue Keppra 500 MG BID + ASA and Atorvastatin.  No recent seizure activity. Reports he is walking 1 1/2 miles a day and going to [facility].  Continues to have loop recorder in place.  Is to return to neurology in one year.  Relevant past medical, surgical, family and social history  reviewed and updated as indicated. Interim medical history  since our last visit reviewed. Allergies and medications reviewed and updated.  Review of Systems  Constitutional: Negative for activity change, diaphoresis, fatigue and fever.  Respiratory: Negative for cough, chest tightness, shortness of breath and wheezing.   Cardiovascular: Negative for chest pain, palpitations and leg swelling.  Gastrointestinal: Negative.   Endocrine: Negative for polydipsia, polyphagia and polyuria.  Neurological: Negative.   Psychiatric/Behavioral: Negative.     Per HPI unless specifically indicated above     Objective:    BP 117/70   Pulse 69   Temp (!) 97.5 F (36.4 C) (Oral)   Wt 207 lb 12.8 oz (94.3 kg)   SpO2 97%   BMI 29.82 kg/m   Wt Readings from Last 3 Encounters:  11/23/20 207 lb 12.8 oz (94.3 kg)  11/16/20 210 lb (95.3 kg)  11/12/20 200 lb (90.7 kg)    Physical Exam Vitals and nursing note reviewed.  Constitutional:      General: He is awake. He is not in acute distress.    Appearance: He is well-developed and well-groomed. He is not ill-appearing.  HENT:     Head: Normocephalic and atraumatic.     Right Ear: Hearing normal. No drainage.     Left Ear: Hearing normal. No drainage.     Nose:     Comments: Mask in place. Eyes:     General: Lids are normal.        Right eye: No discharge.        Left eye: No discharge.     Conjunctiva/sclera: Conjunctivae normal.     Pupils: Pupils are equal, round, and reactive to light.  Neck:     Thyroid: No thyromegaly.     Vascular: No carotid bruit or JVD.     Trachea: Trachea normal.  Cardiovascular:     Rate and Rhythm: Normal rate and regular rhythm.     Heart sounds: Normal heart sounds, S1 normal and S2 normal. No murmur heard. No gallop.   Pulmonary:     Effort: Pulmonary effort is normal. No accessory muscle usage or respiratory distress.     Breath sounds: Normal breath sounds.  Abdominal:     General: Bowel sounds are normal.     Palpations: Abdomen is soft. There is no  hepatomegaly or splenomegaly.  Musculoskeletal:        General: Normal range of motion.     Cervical back: Normal range of motion and neck supple.     Right lower leg: No edema.     Left lower leg: No edema.  Skin:    General: Skin is warm and dry.     Capillary Refill: Capillary refill takes less than 2 seconds.  Neurological:     Mental Status: He is alert and oriented to person, place, and time.     Deep Tendon Reflexes: Reflexes are normal and symmetric.  Psychiatric:        Attention and Perception: Attention normal.        Mood and Affect: Mood normal.        Speech: Speech normal.        Behavior: Behavior normal. Behavior is cooperative.        Thought Content: Thought content normal.    Results for orders placed or performed in visit on 11/16/20  CUP [place]  Result Value Ref Range   Date Time Interrogation Session [id number]    Pulse Generator Manufacturer [place]  Pulse Gen Model [id number] Reveal LINQ    Pulse Gen Serial Number [id number]    Clinic Name [facility]    Implantable Pulse Generator Type ICM/ILR    Implantable Pulse Generator Implant Date [id number]    Battery Status OK       Assessment & Plan:   Problem List Items Addressed This Visit      Cardiovascular and Mediastinum   Hypertension associated with diabetes ([place])    Chronic, ongoing with BP below neuro goal <130/90.  Continue current medication regimen and adjust as needed.  Recommend he monitor BP at home daily and document.  Continue focus on DASH diet and regular exercise at home.  CMP, CBC, and TSH today.  Recommend he wear compression hose at home to avoid syncopal episodes and orthostatic BP.  Return in 3 months.      Relevant Medications   telmisartan (MICARDIS) 80 MG tablet   Other Relevant Orders   Bayer DCA Hb A1c Waived   Microalbumin, Urine Waived   CBC with Differential/Platelet   TSH   Coronary artery disease    Continue ASA and maintaining tight lipid  control, may need to increase Atorvastatin to 80 MG.  Lipid panel today.      Relevant Medications   telmisartan (MICARDIS) 80 MG tablet   Exudative age-related macular degeneration of right eye with active choroidal neovascularization ([place])    Continue collaboration with ophthalmology.       Relevant Medications   telmisartan (MICARDIS) 80 MG tablet   Aortic atherosclerosis ([place])    Noted on imaging 07/11/20 and previous imaging.  Recommend continue daily statin therapy and ASA for prevention.      Relevant Medications   telmisartan (MICARDIS) 80 MG tablet     Respiratory   OSA (obstructive sleep apnea)    Continue 100% adherence to CPAP use.          Endocrine   Uncontrolled type 2 diabetes mellitus with hyperglycemia, without long-term current use of insulin (HCC) - Primary    Chronic, ongoing with A1C today 6.8% (upward trend), plus urine ALB 30 and A:C <30 today -- continue Telmisartan for kidney protection. Neuro goal <6.5%, recommend he take medication daily and focus heavily on diet.  Continue  Metformin 1000 MG BID and continue Jardiance 10 MG, will increase Jardiance if A1C elevation noted next visit.  Recommend he check BS twice daily + continue focus on regular exercise and diet.  Return in 3 months for A1C check.      Relevant Medications   telmisartan (MICARDIS) 80 MG tablet   Other Relevant Orders   Bayer DCA Hb A1c Waived   Microalbumin, Urine Waived   Hyperlipidemia associated with type 2 diabetes mellitus (HCC)    Chronic, ongoing.  Continue ASA and maintaining tight lipid control, may need to increase Atorvastatin to 80 MG if ever elevation, recent LDL in 40's.  Continue current Atorvastatin dosing.  Lipid check today.  Refills sent in as needed.      Relevant Medications   telmisartan (MICARDIS) 80 MG tablet   Other Relevant Orders   Bayer DCA Hb A1c Waived   Comprehensive metabolic panel   Lipid Panel w/o Chol/HDL Ratio     Other   History of prostate  cancer    Currently no treatment, continue collaboration with oncology and urology.  Recent notes reviewed.      Vitamin D deficiency    Chronic, improved on recent labs.  Continue daily supplement and  adjust as needed.      Relevant Orders   VITAMIN D 25 Hydroxy (Vit-D Deficiency, Fractures)   Seizures ([place])    Being followed by neurology.  Continue current Keppra dose as prescribed by them and collaboration with neurology, reviewed notes.       Other Visit Diagnoses    Low hemoglobin       Recheck CBC today and check iron and B12 levels.   Relevant Orders   CBC with Differential/Platelet   Iron, TIBC and Ferritin Panel   Vitamin B12       Follow up plan: Return in about 3 months (around 02/23/2021) for T2DM, HTN/HLD, SEIZURE, HISTORY OF PC.

## 2020-11-23 NOTE — Assessment & Plan Note (Signed)
Continue ASA and maintaining tight lipid control, may need to increase Atorvastatin to 80 MG.  Lipid panel today.

## 2020-11-23 NOTE — Assessment & Plan Note (Signed)
Chronic, ongoing with BP below neuro goal <130/90.  Continue current medication regimen and adjust as needed.  Recommend he monitor BP at home daily and document.  Continue focus on DASH diet and regular exercise at home.  CMP, CBC, and TSH today.  Recommend he wear compression hose at home to avoid syncopal episodes and orthostatic BP.  Return in 3 months.

## 2020-11-23 NOTE — Assessment & Plan Note (Signed)
Being followed by neurology.  Continue current Keppra dose as prescribed by them and collaboration with neurology, reviewed notes.   

## 2020-11-24 ENCOUNTER — Other Ambulatory Visit: Payer: Self-pay | Admitting: Family Medicine

## 2020-11-24 ENCOUNTER — Other Ambulatory Visit: Payer: Self-pay | Admitting: Nurse Practitioner

## 2020-11-24 DIAGNOSIS — D649 Anemia, unspecified: Secondary | ICD-10-CM

## 2020-11-24 LAB — LIPID PANEL W/O CHOL/HDL RATIO
Cholesterol, Total: 114 mg/dL (ref 100–199)
HDL: 55 mg/dL (ref >39)
LDL Chol Calc (NIH): 43 mg/dL (ref 0–99)
Triglycerides: 78 mg/dL (ref 0–149)
VLDL Cholesterol Cal: 16 mg/dL (ref 5–40)

## 2020-11-24 LAB — TSH: TSH: 1.73 u[IU]/mL (ref 0.450–4.500)

## 2020-11-24 LAB — CBC WITH DIFFERENTIAL/PLATELET
Basophils Absolute: 0.1 10*3/uL (ref 0.0–0.2)
Basos: 1 %
EOS (ABSOLUTE): 0.4 10*3/uL (ref 0.0–0.4)
Eos: 8 %
Hematocrit: 35.1 % — ABNORMAL LOW (ref 37.5–51.0)
Hemoglobin: 11.5 g/dL — ABNORMAL LOW (ref 13.0–17.7)
Immature Grans (Abs): 0 10*3/uL (ref 0.0–0.1)
Immature Granulocytes: 0 %
Lymphocytes Absolute: 0.8 10*3/uL (ref 0.7–3.1)
Lymphs: 17 %
MCH: 30.9 pg (ref 26.6–33.0)
MCHC: 32.8 g/dL (ref 31.5–35.7)
MCV: 94 fL (ref 79–97)
Monocytes Absolute: 0.4 10*3/uL (ref 0.1–0.9)
Monocytes: 9 %
Neutrophils Absolute: 2.9 10*3/uL (ref 1.4–7.0)
Neutrophils: 65 %
Platelets: 215 10*3/uL (ref 150–450)
RBC: 3.72 x10E6/uL — ABNORMAL LOW (ref 4.14–5.80)
RDW: 12.1 % (ref 11.6–15.4)
WBC: 4.6 10*3/uL (ref 3.4–10.8)

## 2020-11-24 LAB — COMPREHENSIVE METABOLIC PANEL
ALT: 24 IU/L (ref 0–44)
AST: 21 IU/L (ref 0–40)
Albumin/Globulin Ratio: 1.9 (ref 1.2–2.2)
Albumin: 4.5 g/dL (ref 3.8–4.8)
Alkaline Phosphatase: 116 IU/L (ref 44–121)
BUN/Creatinine Ratio: 24 (ref 10–24)
BUN: 27 mg/dL (ref 8–27)
Bilirubin Total: 0.6 mg/dL (ref 0.0–1.2)
CO2: 23 mmol/L (ref 20–29)
Calcium: 9.3 mg/dL (ref 8.6–10.2)
Chloride: 99 mmol/L (ref 96–106)
Creatinine, Ser: 1.12 mg/dL (ref 0.76–1.27)
Globulin, Total: 2.4 g/dL (ref 1.5–4.5)
Glucose: 133 mg/dL — ABNORMAL HIGH (ref 65–99)
Potassium: 4.7 mmol/L (ref 3.5–5.2)
Sodium: 138 mmol/L (ref 134–144)
Total Protein: 6.9 g/dL (ref 6.0–8.5)
eGFR: 71 mL/min/{1.73_m2} (ref >59)

## 2020-11-24 LAB — IRON,TIBC AND FERRITIN PANEL
Ferritin: 192 ng/mL (ref 30–400)
Iron Saturation: 20 % (ref 15–55)
Iron: 65 ug/dL (ref 38–169)
Total Iron Binding Capacity: 333 ug/dL (ref 250–450)
UIBC: 268 ug/dL (ref 111–343)

## 2020-11-24 LAB — VITAMIN D 25 HYDROXY (VIT D DEFICIENCY, FRACTURES): Vit D, 25-Hydroxy: 39.4 ng/mL (ref 30.0–100.0)

## 2020-11-24 LAB — VITAMIN B12: Vitamin B-12: >2000 pg/mL — ABNORMAL HIGH (ref 232–1245)

## 2020-11-24 NOTE — Progress Notes (Signed)
Contacted via [place] afternoon Masoud, your labs have returned: - Kidney function, creatinine and eGFR, are normal. Liver function, AST and ALT, are normal. - Thyroid level is normal.  Cholesterol levels show stable levels.  - Vitamin D level is normal.  B12 level is above normal, I recommend cutting back on your B12 supplement to every other day please. - CBC is continuing to show some anemia with hemoglobin 11.5, iron level is normal -- I would like you to return in 4 weeks for lab visit only to recheck this and also take home a stool kit to obtain stool sample to ensure no blood passing.  Please schedule lab visit only.  Any questions? Keep being awesome!!  Thank you for allowing me to participate in your care.  I appreciate you. Kindest regards, Beatrice Ziehm

## 2020-11-24 NOTE — Telephone Encounter (Signed)
Requested Prescriptions  Pending Prescriptions Disp Refills  . Lancets (ONETOUCH ULTRASOFT) lancets [Pharmacy Med Name: ONETOUCH ULTRA LAN MIS] 100 each 11    Sig: USE 1  TO [place] DAILY     Endocrinology: Diabetes - Testing Supplies Passed - 11/24/2020 10:22 AM      Passed - Valid encounter within last 12 months    Recent Outpatient Visits          Yesterday Uncontrolled type 2 diabetes mellitus with hyperglycemia, without long-term current use of insulin ([place])   [place], [place] T, NP   2 months ago Preoperative clearance   [place], Vernon T, NP   4 months ago Uncontrolled type 2 diabetes mellitus with hyperglycemia, without long-term current use of insulin ([place])   [place], Jolene T, NP   8 months ago Uncontrolled type 2 diabetes mellitus with hyperglycemia, without long-term current use of insulin ([place])   [place], Jolene T, NP   11 months ago Uncontrolled type 2 diabetes mellitus with hyperglycemia, without long-term current use of insulin ([place])   [place], [name], NP      Future Appointments            In 2 months [name], [name], NP [facility]   In 8 months  [facility]

## 2020-11-27 NOTE — Progress Notes (Signed)
Carelink Summary Report / Loop Recorder 

## 2020-11-29 ENCOUNTER — Other Ambulatory Visit: Payer: Self-pay

## 2020-11-29 ENCOUNTER — Inpatient Hospital Stay: Payer: PPO | Attending: Radiation Oncology

## 2020-11-29 DIAGNOSIS — Z923 Personal history of irradiation: Secondary | ICD-10-CM | POA: Insufficient documentation

## 2020-11-29 DIAGNOSIS — C61 Malignant neoplasm of prostate: Secondary | ICD-10-CM

## 2020-11-30 LAB — PSA: Prostatic Specific Antigen: 0.84 ng/mL (ref 0.00–4.00)

## 2020-12-06 ENCOUNTER — Ambulatory Visit
Admission: RE | Admit: 2020-12-06 | Discharge: 2020-12-06 | Disposition: A | Discharge status: Home / Self Care | Payer: PPO | Source: Ambulatory Visit | Attending: Radiation Oncology | Admitting: Radiation Oncology

## 2020-12-06 ENCOUNTER — Encounter: Payer: Self-pay | Admitting: Radiation Oncology

## 2020-12-06 VITALS — BP 123/74 | HR 79 | Temp 97.8°F | Wt 205.0 lb

## 2020-12-06 DIAGNOSIS — K760 Fatty (change of) liver, not elsewhere classified: Secondary | ICD-10-CM | POA: Insufficient documentation

## 2020-12-06 DIAGNOSIS — C61 Malignant neoplasm of prostate: Secondary | ICD-10-CM | POA: Insufficient documentation

## 2020-12-06 DIAGNOSIS — Z923 Personal history of irradiation: Secondary | ICD-10-CM | POA: Insufficient documentation

## 2020-12-06 NOTE — Progress Notes (Signed)
Radiation Oncology Follow up Note  Name: Andrew Cobb   Date:   12/06/2020 MRN:  [id number] DOB: 1950-12-12    This 70 y.o. 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  Patient had a CT scan of his head chest abdomen and pelvis.  Recent CT head showed no evidence of acute intracranial abnormality he does have some cortical and subcortical infarcts is been having some mental status changes.  CT scan of abdomen pelvis back in January has been reviewed showed evidence of enlarged prostate gland fatty liver consistent with small stable hepatic cyst.  No evidence of progressive disease.  COMPLICATIONS OF TREATMENT: none  FOLLOW UP COMPLIANCE: keeps appointments   PHYSICAL EXAM:  BP 123/74   Pulse 79   Temp 97.8 F (36.6 C) (Tympanic)   Wt 205 lb (93 kg)   BMI 29.41 kg/m  Well-developed well-nourished patient in NAD. HEENT reveals PERLA, EOMI, discs not visualized.  Oral cavity is clear. No oral mucosal lesions are identified. Neck is clear without evidence of cervical or supraclavicular adenopathy. Lungs are clear to A&P. Cardiac examination is essentially unremarkable with regular rate and rhythm without murmur rub or thrill. Abdomen is benign with no organomegaly or masses noted. Motor sensory and DTR levels are equal and symmetric in the upper and lower extremities. Cranial nerves II through XII are grossly  intact. Proprioception is intact. No peripheral adenopathy or edema is identified. No motor or sensory levels are noted. Crude visual fields are within normal range.  RADIOLOGY RESULTS: CT scan of head chest abdomen pelvis all reviewed compatible with above-stated findings  PLAN: Present time patient is doing well and her excellent biochemical control of his prostate cancer.  On pleased with his overall progress.  I have asked to see him back in 6 months for follow-up with a repeat PSA.  Patient is to call with any concerns.  I would like to take this opportunity to thank you for allowing me to participate in the care of your patient.Noreene Filbert, MD

## 2020-12-09 LAB — CUP PACEART REMOTE DEVICE CHECK
Date Time Interrogation Session: 20220602031546
Implantable Pulse Generator Implant Date: 20200316

## 2020-12-10 ENCOUNTER — Telehealth: Payer: Self-pay

## 2020-12-10 ENCOUNTER — Ambulatory Visit (INDEPENDENT_AMBULATORY_CARE_PROVIDER_SITE_OTHER): Payer: PPO

## 2020-12-10 DIAGNOSIS — R55 Syncope and collapse: Secondary | ICD-10-CM

## 2020-12-10 NOTE — Telephone Encounter (Signed)
Please reach out to patient and see if he needs this, I can send in a low dose of Xanax or Valium for procedure if needed.  However, if not needed then will not send in.

## 2020-12-10 NOTE — Telephone Encounter (Signed)
Attempted to call patient, no answer left VM for patient to give office a call back.

## 2020-12-10 NOTE — Telephone Encounter (Signed)
Andrew Cobb from [facility] called to verify if patient needs to be pre-medicated before getting any type of dental procedure done. Patient needs to get some crowns. Per dental office if we could fax over and let them know if patient should be pre-medicated or not.

## 2020-12-11 NOTE — Telephone Encounter (Signed)
Second attempt to contact patient regarding medication for dental procedure, no answer left voicemail.

## 2020-12-13 ENCOUNTER — Other Ambulatory Visit: Payer: Self-pay

## 2020-12-13 ENCOUNTER — Telehealth: Payer: Self-pay

## 2020-12-13 ENCOUNTER — Ambulatory Visit (INDEPENDENT_AMBULATORY_CARE_PROVIDER_SITE_OTHER): Payer: PPO | Admitting: Family Medicine

## 2020-12-13 ENCOUNTER — Encounter: Payer: Self-pay | Admitting: Family Medicine

## 2020-12-13 ENCOUNTER — Telehealth: Payer: Self-pay | Admitting: *Deleted

## 2020-12-13 VITALS — BP 125/75 | HR 71 | Temp 98.2°F | Ht 69.0 in | Wt 203.8 lb

## 2020-12-13 DIAGNOSIS — Z538 Procedure and treatment not carried out for other reasons: Secondary | ICD-10-CM

## 2020-12-13 DIAGNOSIS — G4733 Obstructive sleep apnea (adult) (pediatric): Secondary | ICD-10-CM

## 2020-12-13 NOTE — Telephone Encounter (Signed)
Patient dropped off forms for provider to fill out to be able to renew D.L. Placed in provider folder.

## 2020-12-13 NOTE — Progress Notes (Signed)
Only needs paperwork filled out. Did not need appointment. Will cancel today

## 2020-12-13 NOTE — Telephone Encounter (Signed)
Per Dental office they need to know if patient needs antibiotics before every visit due to his medical history.

## 2020-12-13 NOTE — Telephone Encounter (Signed)
Pt brought forms back for Jolene to look @ pages 5&6 to see if she can or need to sign them. He also need a referral for a Sleep Study sent to [facility] [phone] Please advise Thanks Placed in [facility] for review

## 2020-12-14 NOTE — Telephone Encounter (Signed)
Letter created, placed in folder for provider signature. Will fax to [facility] [phone]

## 2020-12-19 ENCOUNTER — Telehealth: Payer: Self-pay

## 2020-12-19 NOTE — Telephone Encounter (Signed)
Called patient left message to make aware forms are complete and ready to pick up. Also made patient aware sleep study has been put in by [facility]. Placed forms in completed forms bin.

## 2020-12-20 ENCOUNTER — Ambulatory Visit (INDEPENDENT_AMBULATORY_CARE_PROVIDER_SITE_OTHER): Payer: PPO | Admitting: Ophthalmology

## 2020-12-20 ENCOUNTER — Encounter (INDEPENDENT_AMBULATORY_CARE_PROVIDER_SITE_OTHER): Payer: Self-pay | Admitting: Ophthalmology

## 2020-12-20 ENCOUNTER — Other Ambulatory Visit: Payer: Self-pay

## 2020-12-20 DIAGNOSIS — H35721 Serous detachment of retinal pigment epithelium, right eye: Secondary | ICD-10-CM

## 2020-12-20 DIAGNOSIS — H353211 Exudative age-related macular degeneration, right eye, with active choroidal neovascularization: Secondary | ICD-10-CM

## 2020-12-20 DIAGNOSIS — E1165 Type 2 diabetes mellitus with hyperglycemia: Secondary | ICD-10-CM

## 2020-12-20 MED ORDER — BEVACIZUMAB 2.5 MG/0.1ML IZ SOSY
2.5000 mg | PREFILLED_SYRINGE | INTRAVITREAL | Status: AC | PRN
  Administered 2020-12-20: 2.5 mg via INTRAVITREAL

## 2020-12-20 NOTE — Progress Notes (Signed)
12/20/2020     CHIEF COMPLAINT Patient presents for Retina Follow Up (6 Wk F/U OD, poss Avastin OD//Pt denies noticeable changes to [place] OU since last visit. Pt denies ocular pain, flashes of light, or floaters OU. /LBS: 122 this AM/A1c: 6.7, 11/2020//)   HISTORY OF PRESENT ILLNESS: Andrew Cobb is a 70 y.o. male who presents to the clinic today for:   HPI     Retina Follow Up           Diagnosis: Wet AMD   Laterality: right eye   Onset: 6 weeks ago   Severity: mild   Duration: 6 weeks   Course: stable   Comments: 6 Wk F/U OD, poss Avastin OD  Pt denies noticeable changes to [place] OU since last visit. Pt denies ocular pain, flashes of light, or floaters OU.  LBS: 122 this AM A1c: 6.7, 11/2020         Last edited by Milly Jakob, [place] on 12/20/2020  8:31 AM.      Referring physician: [name], NP [place],  [place] [id number]  HISTORICAL INFORMATION:   Selected notes from the MEDICAL RECORD NUMBER    Lab Results  Component Value Date   HGBA1C 6.8 11/23/2020     CURRENT MEDICATIONS: No current outpatient medications on file. (Ophthalmic Drugs)   No current facility-administered medications for this visit. (Ophthalmic Drugs)   Current Outpatient Medications (Other)  Medication Sig   acyclovir (ZOVIRAX) 400 MG tablet Take 1 tablet (400 mg total) by mouth 2 (two) times daily.   amitriptyline (ELAVIL) 25 MG tablet Take 1 tablet (25 mg total) by mouth at bedtime.   aspirin EC 81 MG EC tablet Take 1 tablet (81 mg total) by mouth daily.   atorvastatin (LIPITOR) 40 MG tablet Take 1 tablet (40 mg total) by mouth at bedtime.   betamethasone dipropionate (DIPROLENE) 0.05 % ointment Apply 1 application topically 2 (two) times daily as needed for rash.   cetirizine (ZYRTEC) 10 MG tablet Take 10 mg by mouth daily.   Cholecalciferol (VITAMIN D) 2000 units CAPS Take 2,000 Units by mouth daily.   Cyanocobalamin (B-12) 5000 MCG CAPS Take 5,000 mcg by mouth  daily.   empagliflozin (JARDIANCE) 10 MG TABS tablet Take 1 tablet (10 mg total) by mouth daily before breakfast.   Lancets (ONETOUCH ULTRASOFT) lancets USE 1  TO CHECK GLUCOSE TWICE DAILY   levETIRAcetam (KEPPRA) 500 MG tablet Take 1 tablet (500 mg total) by mouth 2 (two) times daily.   metFORMIN (GLUCOPHAGE) 1000 MG tablet Take 1 tablet (1,000 mg total) by mouth 2 (two) times daily with a meal.   Multiple Vitamins-Minerals (MULTIVITAMIN PO) Take 1 tablet by mouth daily.    ONETOUCH ULTRA test strip USE TWICE DAILY   tamsulosin (FLOMAX) 0.4 MG CAPS capsule Take 1 capsule (0.4 mg total) by mouth at bedtime.   telmisartan (MICARDIS) 80 MG tablet Take 0.5 tablets (40 mg total) by mouth daily.   No current facility-administered medications for this visit. (Other)      REVIEW OF SYSTEMS:    ALLERGIES Allergies  Allergen Reactions   Succinylsulphathiazole Rash   Sulfamethoxazole-Trimethoprim Rash   Tetracyclines & Related Rash    PAST MEDICAL HISTORY Past Medical History:  Diagnosis Date   Arthritis    Asthma    has not needed since 6/21   Cancer [facility])    prostate   Chronic venous insufficiency    varicose vein lower extremity  with inflammation   Coronary artery disease 1996   two stents placed    Diabetes mellitus without complication ([place])    type 2 on metformin   GERD (gastroesophageal reflux disease)    no issues since gastric bypass surgery as stated per pt   Hyperlipidemia    Hypertension    Hypogonadism in male    MRSA (methicillin resistant Staphylococcus aureus) infection    07/30/2008 thru 08/07/2008  abdominal abcess   Sleep apnea    on BIPAP   Stented coronary artery    Stroke ([place]) 09/07/2019   no defecits   Thrombocythemia    Past Surgical History:  Procedure Laterality Date   ANGIOPLASTY     ANGIOPLASTY     with stent 04/07/1995   ANTERIOR CERVICAL DECOMPRESSION/DISCECTOMY FUSION 4 LEVELS N/A 08/17/2017   Procedure: Anterior discectomy with fusion and  plate fixation Cervical Three-Four, Four-Five, Five-Six, and Six-Seven Fusion;  Surgeon: Ditty, Kevan Ny, MD;  Location: [place];  Service: Neurosurgery;  Laterality: N/A;  Anterior discectomy with fusion and plate fixation Cervical Three-Four, Four-Five, Five-Six, and Six-Seven Fusion    APPENDECTOMY     1966   BUBBLE STUDY  09/20/2018   Procedure: BUBBLE STUDY;  Surgeon: Josue Hector, MD;  Location: [facility] ENDOSCOPY;  Service: Cardiovascular;;   [place] TEST     07/31/2011   CARPAL TUNNEL RELEASE Left    CHOLECYSTECTOMY     2006   colonscopy      08/25/2012   EYE SURGERY Bilateral    cataract   FUNCTIONAL ENDOSCOPIC SINUS SURGERY     11/10/2013   GASTRIC BYPASS     10/05/2012   HERNIA REPAIR     left inguinal 1981   INCISION AND DRAINAGE ABSCESS Right 02/26/2017   Procedure: INCISION AND DRAINAGE ABSCESS;  Surgeon: Nickie Retort, MD;  Location: [facility] ORS;  Service: Urology;  Laterality: Right;   JOINT REPLACEMENT     bilateral   left ankle surgery      05/03/2003    left carpel tunnel      09/18/1993   left knee meniscal tear      01/25/2010   left knee meniscal tear repair      05/04/1996   left rotator cuff repair      05/03/2003    LOOP RECORDER INSERTION N/A 09/20/2018   Procedure: LOOP RECORDER INSERTION;  Surgeon: Evans Lance, MD;  Location: [place] [facility];  Service: Cardiovascular;  Laterality: N/A;   REPLACEMENT TOTAL KNEE BILATERAL  07/13/2015   REVERSE SHOULDER ARTHROPLASTY Left 08/30/2020   Procedure: REVERSE SHOULDER ARTHROPLASTY;  Surgeon: Justice Britain, MD;  Location: [facility] ORS;  Service: Orthopedics;  Laterality: Left;  165min   right ankle surgery      fracture has 2 screws 07/07/1997   right carpel tunnel      05/16/1992   right shoulder replacement      01/27/2006   SCROTAL EXPLORATION Right 02/26/2017   Procedure: SCROTUM EXPLORATION;  Surgeon: Nickie Retort, MD;  Location: [facility] ORS;  Service:  Urology;  Laterality: Right;   TEE WITHOUT CARDIOVERSION N/A 09/20/2018   Procedure: TRANSESOPHAGEAL ECHOCARDIOGRAM (TEE);  Surgeon: Josue Hector, MD;  Location: [facility] ENDOSCOPY;  Service: Cardiovascular;  Laterality: N/A;   TOTAL KNEE ARTHROPLASTY Left 07/13/2015   Procedure: LEFT TOTAL KNEE ARTHROPLASTY;  Surgeon: Gaynelle Arabian, MD;  Location: [facility] ORS;  Service: Orthopedics;  Laterality: Left;    FAMILY HISTORY  Family History  Problem Relation Age of Onset   Cancer Mother        pancreatic   Diabetes Mother    Stroke Mother    Heart disease Mother    Hyperlipidemia Mother    Hypertension Mother    Heart attack Mother    Heart disease Father    Stroke Father    Diabetes Father    Hypertension Father    Heart attack Father    Hyperlipidemia Father    Pancreatic cancer Father    Cancer Sister    Cancer Brother        lung   Cancer Brother    Kidney cancer Neg Hx    Bladder Cancer Neg Hx    Prostate cancer Neg Hx     SOCIAL HISTORY Social History   Tobacco Use   Smoking status: Former    Packs/day: 1.00    Years: 10.00    Pack years: 10.00    Types: Cigarettes    Quit date: 07/07/1984    Years since quitting: 36.4   Smokeless tobacco: Never   Tobacco comments:    quit 1986  Vaping Use   Vaping Use: Never used  Substance Use Topics   Alcohol use: No    Alcohol/week: 0.0 standard drinks   Drug use: No         OPHTHALMIC EXAM:  Base Eye Exam     Visual Acuity (ETDRS)       Right Left   Dist [place] 20/30 +2 20/20 -2   Dist ph [place] NI          Tonometry (Tonopen, 8:35 AM)       Right Left   Pressure 10 12         Pupils       Pupils Dark Light Shape React APD   Right PERRL 4 3 Round Brisk None   Left PERRL 4 3 Round Brisk None         Visual Fields (Counting fingers)       Left Right    Full Full         Extraocular Movement       Right Left    Full Full         Neuro/Psych     Oriented x3: Yes   Mood/Affect: Normal          Dilation     Right eye: 1.0% Mydriacyl, 2.5% Phenylephrine @ 8:35 AM           Slit Lamp and Fundus Exam     External Exam       Right Left   External Normal Normal         Slit Lamp Exam       Right Left   Lids/Lashes Normal Normal   Conjunctiva/Sclera White and quiet White and quiet   Cornea Clear Clear   Anterior Chamber Deep and quiet Deep and quiet   Iris Round and reactive Round and reactive   Lens Posterior chamber intraocular lens Posterior chamber intraocular lens   Anterior Vitreous Normal Normal         Fundus Exam       Right Left   Posterior Vitreous Posterior vitreous detachment    Disc Normal    C/D Ratio 0.6    Macula Hard drusen, no exudates, Macular thickening, Soft drusen, Pigmented atrophy, Retinal pigment epithelial mottling, Retinal pigment epithelial detachment    Vessels Normal, , no DR  Periphery Normal             IMAGING AND PROCEDURES  Imaging and Procedures for 12/20/20  OCT, Retina - OU - Both Eyes       Right Eye Quality was good. Scan locations included subfoveal. Central Foveal Thickness: 338. Progression has been stable. Findings include abnormal foveal contour, subretinal fluid, pigment epithelial detachment, no IRF.   Left Eye Quality was good. Scan locations included subfoveal. Central Foveal Thickness: 290. Progression has been stable. Findings include retinal drusen , abnormal foveal contour, no SRF, no IRF.   Notes Today OD at 6 week interval, small change with decreased serous retinal detachment adjacent to the vascularized pigment epithelial detachment nasal to the FAZ.  We will repeat injection today Avastin and examination again in 6 weeks      Intravitreal Injection, Pharmacologic Agent - OD - Right Eye       Time Out 12/20/2020. 9:02 AM. Confirmed correct patient, procedure, site, and patient consented.   Anesthesia Topical anesthesia was used. Anesthetic medications included [place] 3.5%.    Procedure Preparation included Tobramycin 0.3%, 10% betadine to eyelids, 5% betadine to ocular surface. A 30 gauge needle was used.   Injection: 2.5 mg bevacizumab 2.5 MG/0.1ML   Route: Intravitreal, Site: Right Eye   NDC: [phone], Lot: [id number]   Post-op Post injection exam found visual acuity of at least counting fingers. The patient tolerated the procedure well. There were no complications. The patient received written and verbal post procedure care education. Post injection medications were not given.              ASSESSMENT/PLAN:  Serous detachment of retinal pigment epithelium of right eye As component of wet AMD, OD much improved  Exudative age-related macular degeneration of right eye with active choroidal neovascularization (HCC) Much less active CNVM yet still with subretinal fluid, currently at 6-week interval.  We will repeat intravitreal Avastin OD today and maintain 6-week follow-up interval  Uncontrolled type 2 diabetes mellitus with hyperglycemia, without long-term current use of insulin (HCC) No detectable diabetic retinopathy today     ICD-10-CM   1. Exudative age-related macular degeneration of right eye with active choroidal neovascularization (HCC)  H35.3211 OCT, Retina - OU - Both Eyes    Intravitreal Injection, Pharmacologic Agent - OD - Right Eye    bevacizumab (AVASTIN) SOSY 2.5 mg    2. Serous detachment of retinal pigment epithelium of right eye  H35.721     3. Uncontrolled type 2 diabetes mellitus with hyperglycemia, without long-term current use of insulin (HCC)  E11.65       1.  Chronic active wet ARMD OD.  Repeat injection intravitreal Avastin today to maintain good acuity  2.  Notably, no detectable diabetic retinopathy in right eye which was dilated today  3.  Ophthalmic Meds Ordered this visit:  Meds ordered this encounter  Medications   bevacizumab (AVASTIN) SOSY 2.5 mg       Return in about 6 weeks (around 01/31/2021)  for dilate, OD, AVASTIN OCT.  There are no Patient Instructions on file for this visit.   Explained the diagnoses, plan, and follow up with the patient and they expressed understanding.  Patient expressed understanding of the importance of proper follow up care.   [name] Javae Braaten M.D. Diseases & Surgery of the Retina and Vitreous [facility] [place] 12/20/20     Abbreviations: M myopia (nearsighted); A astigmatism; H hyperopia (farsighted); P presbyopia; Mrx spectacle prescription;  CTL  contact lenses; OD right eye; OS left eye; OU both eyes  XT exotropia; ET esotropia; PEK punctate epithelial keratitis; PEE punctate epithelial erosions; DES dry eye syndrome; MGD meibomian gland dysfunction; ATs artificial tears; PFAT's preservative free artificial tears; [place] nuclear sclerotic cataract; PSC posterior subcapsular cataract; ERM epi-retinal membrane; PVD posterior vitreous detachment; RD retinal detachment; DM diabetes mellitus; DR diabetic retinopathy; NPDR non-proliferative diabetic retinopathy; PDR proliferative diabetic retinopathy; CSME clinically significant macular edema; DME diabetic macular edema; dbh dot blot hemorrhages; CWS cotton wool spot; POAG primary open angle glaucoma; C/D cup-to-disc ratio; HVF humphrey visual field; GVF goldmann visual field; OCT optical coherence tomography; IOP intraocular pressure; BRVO Branch retinal vein occlusion; CRVO central retinal vein occlusion; CRAO central retinal artery occlusion; BRAO branch retinal artery occlusion; RT retinal tear; SB scleral buckle; PPV pars plana vitrectomy; VH Vitreous hemorrhage; PRP panretinal laser photocoagulation; IVK intravitreal kenalog; VMT vitreomacular traction; MH Macular hole;  NVD neovascularization of the disc; NVE neovascularization elsewhere; AREDS age related eye disease study; ARMD age related macular degeneration; POAG primary open angle glaucoma; EBMD epithelial/anterior basement membrane dystrophy;  ACIOL anterior chamber intraocular lens; IOL intraocular lens; PCIOL posterior chamber intraocular lens; Phaco/IOL phacoemulsification with intraocular lens placement; [place] photorefractive keratectomy; LASIK laser assisted in situ keratomileusis; HTN hypertension; DM diabetes mellitus; COPD chronic obstructive pulmonary disease

## 2020-12-20 NOTE — Assessment & Plan Note (Signed)
Much less active CNVM yet still with subretinal fluid, currently at 6-week interval.  We will repeat intravitreal Avastin OD today and maintain 6-week follow-up interval

## 2020-12-20 NOTE — Assessment & Plan Note (Signed)
As component of wet AMD, OD much improved

## 2020-12-20 NOTE — Assessment & Plan Note (Signed)
No detectable diabetic retinopathy today 

## 2020-12-26 ENCOUNTER — Ambulatory Visit: Payer: PPO | Admitting: Neurology

## 2020-12-26 ENCOUNTER — Encounter: Payer: Self-pay | Admitting: Neurology

## 2020-12-26 ENCOUNTER — Other Ambulatory Visit: Payer: Self-pay

## 2020-12-26 VITALS — BP 132/72 | HR 61 | Ht 70.0 in | Wt 205.0 lb

## 2020-12-26 DIAGNOSIS — G4733 Obstructive sleep apnea (adult) (pediatric): Secondary | ICD-10-CM | POA: Diagnosis not present

## 2020-12-26 DIAGNOSIS — Z9989 Dependence on other enabling machines and devices: Secondary | ICD-10-CM | POA: Diagnosis not present

## 2020-12-26 NOTE — Progress Notes (Signed)
Subjective:    Patient ID: Andrew Cobb is a 70 y.o. male.  HPI    Andrew Age, Andrew Cobb, Andrew Cobb [facility] [address], [address] [place], Andrew Cobb [address]  Dear Henrine Screws,   I saw your patient, Andrew Cobb, upon your kind request in my sleep clinic today for initial consultation of his sleep disorder, in particular, evaluation of his prior diagnosis of obstructive sleep apnea.  The patient is unaccompanied today.  As you know, Andrew Cobb is a 70 year old right-handed gentleman with an underlying complex medical history of chronic venous insufficiency, prostate cancer, arthritis, with status post bilateral shoulder replacement surgeries, status post bilateral knee replacement surgeries, asthma, diabetes, reflux disease, hypertension, hyperlipidemia, hypogonadism, coronary artery disease with status post stenting, history of strokes and seizures (followed by my colleagues Dr. Leonie Man and [name], NP), thrombocytopenia, and overweight state, who was previously diagnosed with obstructive sleep apnea several years ago.  He reports that his original diagnosis was 30+ years ago, he had a home sleep test at the time.  He has been faithful with his CPAP for years.  He had another sleep test in the lab in 2016, We can consider, he reports that study was done at [name].  As far as he recalls, he has had severe sleep apnea for years.  He cannot sleep without his CPAP.  His current DME company is [employer], previously [place].  He is up-to-date with his supplies.  He goes to bed generally between 930 and 10 PM and rise time is around 7:30 AM.  He works as a Air cabin crew, working as Web designer.  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  I reviewed his CPAP compliance data for the past year.  Out of 365 days he used his machine 363 days with percent use days greater than 4 hours at 97%, indicating excellent  compliance and very good apnea control with an AHI of 2.1/h.  He is married and lives with his wife.  He has nocturia about once per average night, if that, and denies any morning headaches.  His Epworth sleepiness score is 3 out of 24, fatigue severity score is 9 out of 63.  He has been on CPAP therapy for years.  Prior sleep study results are not available for my review today.  I reviewed your office note from 11/23/2020.   I was able to review his CPAP compliance data from the past 30 days.  From 11/26/2020 through 12/25/2020 he used his machine every night with percent use days greater than 4 hours at 93%, indicating excellent compliance with an average usage of 8 hours and 42 minutes, residual AHI at goal at 2.5/h, leak on the low side with a 95th percentile at 0.4 L/min on a pressure of 7 cm with EPR of 3.  Set up date of this machine, which is an air sense 10 from [employer], according to [employer] was 07/24/2014.  His Past Medical History Is Significant For: Past Medical History:  Diagnosis Date   Arthritis    Asthma    has not needed since 6/21   Cancer [facility])    prostate   Chronic venous insufficiency    varicose vein lower extremity with inflammation   Coronary artery disease 1996   two stents placed    Diabetes mellitus without complication ([place])    type 2 on metformin   GERD (gastroesophageal reflux disease)  no issues since gastric bypass surgery as stated per pt   Hyperlipidemia    Hypertension    Hypogonadism in male    MRSA (methicillin resistant Staphylococcus aureus) infection    07/30/2008 thru 08/07/2008  abdominal abcess   Sleep apnea    on BIPAP   Stented coronary artery    Stroke ([place]) 09/07/2019   no defecits   Thrombocythemia     His Past Surgical History Is Significant For: Past Surgical History:  Procedure Laterality Date   ANGIOPLASTY     ANGIOPLASTY     with stent 04/07/1995   ANTERIOR CERVICAL DECOMPRESSION/DISCECTOMY FUSION 4 LEVELS N/A  08/17/2017   Procedure: Anterior discectomy with fusion and plate fixation Cervical Three-Four, Four-Five, Five-Six, and Six-Seven Fusion;  Surgeon: Ditty, Kevan Ny, Andrew Cobb;  Location: [facility];  Service: Neurosurgery;  Laterality: N/A;  Anterior discectomy with fusion and plate fixation Cervical Three-Four, Four-Five, Five-Six, and Six-Seven Fusion    APPENDECTOMY     1966   BUBBLE STUDY  09/20/2018   Procedure: BUBBLE STUDY;  Surgeon: Josue Hector, Andrew Cobb;  Location: [facility] END[facility];  Service: Cardiovascular;;   [place] TEST     07/31/2011   CARPAL TUNNEL RELEASE Left    CHOLECYSTECTOMY     2006   colonscopy      08/25/2012   EYE SURGERY Bilateral    cataract   FUNCTIONAL ENDOSCOPIC SINUS SURGERY     11/10/2013   GASTRIC BYPASS     10/05/2012   HERNIA REPAIR     left inguinal 1981   INCISION AND DRAINAGE ABSCESS Right 02/26/2017   Procedure: INCISION AND DRAINAGE ABSCESS;  Surgeon: Nickie Retort, Andrew Cobb;  Location: [facility] ORS;  Service: Urology;  Laterality: Right;   JOINT REPLACEMENT     bilateral   left ankle surgery      05/03/2003    left carpel tunnel      09/18/1993   left knee meniscal tear      01/25/2010   left knee meniscal tear repair      05/04/1996   left rotator cuff repair      05/03/2003    LOOP RECORDER INSERTION N/A 09/20/2018   Procedure: LOOP RECORDER INSERTION;  Surgeon: Evans Lance, Andrew Cobb;  Location: [place] [facility] LAB;  Service: Cardiovascular;  Laterality: N/A;   REPLACEMENT TOTAL KNEE BILATERAL  07/13/2015   REVERSE SHOULDER ARTHROPLASTY Left 08/30/2020   Procedure: REVERSE SHOULDER ARTHROPLASTY;  Surgeon: Justice Britain, Andrew Cobb;  Location: [facility];  Service: Orthopedics;  Laterality: Left;  127min   right ankle surgery      fracture has 2 screws 07/07/1997   right carpel tunnel      05/16/1992   right shoulder replacement      01/27/2006   SCROTAL EXPLORATION Right 02/26/2017   Procedure: SCROTUM EXPLORATION;  Surgeon:  Nickie Retort, Andrew Cobb;  Location: [facility] ORS;  Service: Urology;  Laterality: Right;   TEE WITHOUT CARDIOVERSION N/A 09/20/2018   Procedure: TRANSESOPHAGEAL ECHOCARDIOGRAM (TEE);  Surgeon: Josue Hector, Andrew Cobb;  Location: [facility] ENDOSCOPY;  Service: Cardiovascular;  Laterality: N/A;   TOTAL KNEE ARTHROPLASTY Left 07/13/2015   Procedure: LEFT TOTAL KNEE ARTHROPLASTY;  Surgeon: Gaynelle Arabian, Andrew Cobb;  Location: [facility] ORS;  Service: Orthopedics;  Laterality: Left;    His Family History Is Significant For: Family History  Problem Relation Cobb of Onset   Cancer Mother        pancreatic   Diabetes Mother  Stroke Mother    Heart disease Mother    Hyperlipidemia Mother    Hypertension Mother    Heart attack Mother    Heart disease Father    Stroke Father    Diabetes Father    Hypertension Father    Heart attack Father    Hyperlipidemia Father    Pancreatic cancer Father    Cancer Sister    Cancer Brother        lung   Cancer Brother    Kidney cancer Neg Hx    Bladder Cancer Neg Hx    Prostate cancer Neg Hx     His Social History Is Significant For: Social History   Socioeconomic History   Marital status: Married    Spouse name: Not on file   Number of children: Not on file   Years of education: Not on file   Highest education level: Some college, no degree  Occupational History    Comment: drives for [employer]   Tobacco Use   Smoking status: Former    Packs/day: 1.00    Years: 10.00    Pack years: 10.00    Types: Cigarettes    Quit date: 07/07/1984    Years since quitting: 36.4   Smokeless tobacco: Never   Tobacco comments:    quit 1986  Vaping Use   Vaping Use: Never used  Substance and Sexual Activity   Alcohol use: No    Alcohol/week: 0.0 standard drinks   Drug use: No   Sexual activity: Yes  Other Topics Concern   Not on file  Social History Narrative   Not on file   Social Determinants of Health   Financial Resource Strain: Low Risk    Difficulty of Paying Living  Expenses: Not hard at all  Food Insecurity: No Food Insecurity   Worried About Charity fundraiser in the Last Year: Never true   Ran Out of Food in the Last Year: Never true  Transportation Needs: No Transportation Needs   Lack of Transportation (Medical): No   Lack of Transportation (Non-Medical): No  Physical Activity: Sufficiently Active   Days of Exercise per Week: 5 days   Minutes of Exercise per Session: 40 min  Stress: No Stress Concern Present   Feeling of Stress : Not at all  Social Connections: Not on file    His Allergies Are:  Allergies  Allergen Reactions   Succinylsulphathiazole Rash   Sulfamethoxazole-Trimethoprim Rash   Tetracyclines & Related Rash  :   His Current Medications Are:  Outpatient Encounter Medications as of 12/26/2020  Medication Sig   acyclovir (ZOVIRAX) 400 MG tablet Take 1 tablet (400 mg total) by mouth 2 (two) times daily.   amitriptyline (ELAVIL) 25 MG tablet Take 1 tablet (25 mg total) by mouth at bedtime.   aspirin EC 81 MG EC tablet Take 1 tablet (81 mg total) by mouth daily.   atorvastatin (LIPITOR) 40 MG tablet Take 1 tablet (40 mg total) by mouth at bedtime.   betamethasone dipropionate (DIPROLENE) 0.05 % ointment Apply 1 application topically 2 (two) times daily as needed for rash.   cetirizine (ZYRTEC) 10 MG tablet Take 10 mg by mouth daily.   Cholecalciferol (VITAMIN D) 2000 units CAPS Take 2,000 Units by mouth daily.   Cyanocobalamin (B-12) 5000 MCG CAPS Take 5,000 mcg by mouth every other day.   empagliflozin (JARDIANCE) 10 MG TABS tablet Take 1 tablet (10 mg total) by mouth daily before breakfast.   Lancets (ONETOUCH  ULTRASOFT) lancets USE 1  TO CHECK GLUCOSE TWICE DAILY   levETIRAcetam (KEPPRA) 500 MG tablet Take 1 tablet (500 mg total) by mouth 2 (two) times daily.   metFORMIN (GLUCOPHAGE) 1000 MG tablet Take 1 tablet (1,000 mg total) by mouth 2 (two) times daily with a meal.   Multiple Vitamins-Minerals (MULTIVITAMIN PO) Take 1  tablet by mouth daily.    ONETOUCH ULTRA test strip USE TWICE DAILY   tamsulosin (FLOMAX) 0.4 MG CAPS capsule Take 1 capsule (0.4 mg total) by mouth at bedtime.   telmisartan (MICARDIS) 80 MG tablet Take 0.5 tablets (40 mg total) by mouth daily.   No facility-administered encounter medications on file as of 12/26/2020.  :   Review of Systems:  Out of a complete 14 point review of systems, all are reviewed and negative with the exception of these symptoms as listed below:  Review of Systems  Neurological:        Here for sleep consult. Was started on CPAP 30 years ago. Pt is currently using CPAP. Pt has no complaints with the machine he is currently using.   Epworth Sleepiness Scale 0= would never doze 1= slight chance of dozing 2= moderate chance of dozing 3= high chance of dozing  Sitting and reading:0 Watching TV:0 Sitting inactive in a public place (ex. Theater or meeting):1 As a passenger in a car for an hour without a break:0 Lying down to rest in the afternoon:1 Sitting and talking to someone:0 Sitting quietly after lunch (no alcohol):1 In a car, while stopped in traffic:0 Total:3    Objective:  Neurological Exam  Physical Exam Physical Examination:   Vitals:   12/26/20 0905  BP: 132/72  Pulse: 61   General Examination: The patient is a very pleasant 70 y.o. male in no acute distress. He appears well-developed and well-nourished and well groomed.   HEENT: Normocephalic, atraumatic, pupils are equal, round and reactive to light, extraocular tracking is good without limitation to gaze excursion or nystagmus noted. Hearing is grossly intact. Face is symmetric with normal facial animation. Speech is clear with no dysarthria noted. There is no hypophonia. There is no lip, neck/head, jaw or voice tremor. Neck is supple with full range of passive and active motion. There are no carotid bruits on auscultation. Oropharynx exam reveals: mild mouth dryness, adequate dental  hygiene with partial dentures and moderate airway crowding secondary to redundant soft palate and larger tongue.  Tongue protrudes centrally and palate elevates symmetrically.  Chest: Clear to auscultation without wheezing, rhonchi or crackles noted.  Heart: S1+S2+0, regular and normal without murmurs, rubs or gallops noted.   Abdomen: Soft, non-tender and non-distended.  Extremities: There is no pitting edema.   Skin: Warm and dry without trophic changes noted.   Musculoskeletal: exam shows decreased range of motion in both shoulders.   Neurologically:  Mental status: The patient is awake, alert and oriented in all 4 spheres. His immediate and remote memory, attention, language skills and fund of knowledge are appropriate. There is no evidence of aphasia, agnosia, apraxia or anomia. Speech is clear with normal prosody and enunciation. Thought process is linear. Mood is normal and affect is normal.  Cranial nerves II - XII are as described above under HEENT exam.  Motor exam: Normal bulk, strength and tone is noted. There is no tremor. Fine motor skills and coordination: grossly intact.  Cerebellar testing: No dysmetria or intention tremor. There is no truncal or gait ataxia.  Sensory exam: intact to light touch  in the upper and lower extremities.  Gait, station and balance: He stands without difficulty, posture is Cobb-appropriate.  He walks with a slight limp.  No walking aid.  Assessment and Plan:  In summary, Andrew Cobb is a very pleasant 70 y.o.-year old male with an underlying complex medical history of chronic venous insufficiency, prostate cancer, arthritis, with status post bilateral shoulder replacement surgeries, status post bilateral knee replacement surgeries, asthma, diabetes, reflux disease, hypertension, hyperlipidemia, hypogonadism, coronary artery disease with status post stenting, history of strokes and seizures (followed by my colleagues Dr. Leonie Man and [name],  NP), thrombocytopenia, and overweight state, who presents for evaluation of his obstructive sleep apnea.  He was diagnosed over 30 years ago originally and has been on CPAP therapy ever since.  He is currently on a machine which was provided to him in 2016.  I was able to review his last 30-day and 90-day as well as 1 year compliance data.  He is fully compliant with treatment and is highly commended for this.  He has continued to benefit from treatment and is up-to-date with his supplies.  He had his last sleep study in 2016.  He is advised that if he wants to get a new machine, he may have to have repeat sleep testing, we could pursue a home sleep test at the time but for now his current machine is working well and he is fully compliant, numbers look good including his usage, average AHI, and leak.  He is advised to follow-up routinely in this clinic in 1 year to see one of our nurse practitioners.  Since he has an appointment with [name] pending for January 2023, he can keep that appointment and follow-up in sleep clinic routinely yearly thereafter.  I answered all his questions today and he was in agreement with the plan.   Thank you very much for allowing me to participate in the care of this nice patient. If I can be of any further assistance to you please do not hesitate to call me at [phone].  Sincerely,   Andrew Age, Andrew Cobb, Andrew Cobb

## 2020-12-26 NOTE — Patient Instructions (Addendum)
It was nice to meet you today.  You are fully compliant with your CPAP, keep up the good work.  Her apnea scores and leak information suggests good settings and good usage.  You can follow-up yearly to see one of our nurse practitioners.  Since you already have an appointment scheduled with [name] in our clinic, you can keep your appointment with her in January 2023.  Please continue using your CPAP regularly. While your insurance requires that you use CPAP at least 4 hours each night on 70% of the nights, I recommend, that you not skip any nights and use it throughout the night if you can. Getting used to CPAP and staying with the treatment long term does take time and patience and discipline. Untreated obstructive sleep apnea when it is moderate to severe can have an adverse impact on cardiovascular health and raise her risk for heart disease, arrhythmias, hypertension, congestive heart failure, stroke and diabetes. Untreated obstructive sleep apnea causes sleep disruption, nonrestorative sleep, and sleep deprivation. This can have an impact on your day to day functioning and cause daytime sleepiness and impairment of cognitive function, memory loss, mood disturbance, and problems focussing. Using CPAP regularly can improve these symptoms.

## 2020-12-27 ENCOUNTER — Telehealth: Payer: Self-pay | Admitting: Nurse Practitioner

## 2020-12-27 NOTE — Telephone Encounter (Signed)
Called to request an order for a new cpap machine.  Stated that on the old machine it say the motor is extended.  Please advise and call to discuss.  Patient would like the order to go to [facility], [phone]

## 2020-12-27 NOTE — Telephone Encounter (Signed)
Please get patient scheduled to discuss cpap

## 2020-12-27 NOTE — Telephone Encounter (Signed)
Can order be placed for this or does pt need apt?

## 2020-12-27 NOTE — Telephone Encounter (Signed)
LVM TO MAKE THIS APT  

## 2020-12-28 NOTE — Telephone Encounter (Signed)
Lvm to make this apt. 

## 2020-12-31 ENCOUNTER — Other Ambulatory Visit: Payer: Self-pay

## 2020-12-31 ENCOUNTER — Encounter: Payer: Self-pay | Admitting: Nurse Practitioner

## 2020-12-31 ENCOUNTER — Ambulatory Visit (INDEPENDENT_AMBULATORY_CARE_PROVIDER_SITE_OTHER): Payer: PPO | Admitting: Nurse Practitioner

## 2020-12-31 DIAGNOSIS — G4733 Obstructive sleep apnea (adult) (pediatric): Secondary | ICD-10-CM | POA: Diagnosis not present

## 2020-12-31 NOTE — Assessment & Plan Note (Signed)
Chronic, ongoing.  Continue 100% adherence to CPAP use.  Will work on obtaining new machine and equipment for patient per his request.

## 2020-12-31 NOTE — Telephone Encounter (Signed)
Pt scheduled per pec for 12/31/2020

## 2020-12-31 NOTE — Patient Instructions (Signed)
Living With Sleep Apnea Sleep apnea is a condition in which breathing pauses or becomes shallow during sleep. Sleep apnea is most commonly caused by a collapsed or blocked airway. People with sleep apnea usually snore loudly. They may have times when they gasp and stop breathing for 10 seconds or more during sleep. This may happenmany times during the night. The breaks in breathing also interrupt the deep sleep that you need to feel rested. Even if you do not completely wake up from the gaps in breathing, your sleep may not be restful and you feel tired during the day. You may also have a headache in the morning and low energy during the day, and you may feel anxiousor depressed. How can sleep apnea affect me? Sleep apnea increases your chances of extreme tiredness during the day (daytime fatigue). It can also increase your risk for health conditions, such as: Heart attack. Stroke. Obesity. Type 2 diabetes. Heart failure. Irregular heartbeat. High blood pressure. If you have daytime fatigue as a result of sleep apnea, you may be more likely to: Perform poorly at school or work. Fall asleep while driving. Have difficulty with attention. Develop depression or anxiety. Have sexual dysfunction. What actions can I take to manage sleep apnea? Sleep apnea treatment  If you were given a device to open your airway while you sleep, use it only as told by your health care provider. You may be given: An oral appliance. This is a custom-made mouthpiece that shifts your lower jaw forward. A continuous positive airway pressure (CPAP) device. This device blows air through a mask when you breathe out (exhale). A nasal expiratory positive airway pressure (EPAP) device. This device has valves that you put into each nostril. A bi-level positive airway pressure (BPAP) device. This device blows air through a mask when you breathe in (inhale) and breathe out (exhale). You may need surgery if other treatments do  not work for you.  Sleep habits Go to sleep and wake up at the same time every day. This helps set your internal clock (circadian rhythm) for sleeping. If you stay up later than usual, such as on weekends, try to get up in the morning within 2 hours of your normal wake time. Try to get at least 7-9 hours of sleep each night. Stop using a computer, tablet, and mobile phone a few hours before bedtime. Do not take long naps during the day. If you nap, limit it to 30 minutes. Have a relaxing bedtime routine. Reading or listening to music may relax you and help you sleep. Use your bedroom only for sleep. Keep your television and computer out of your bedroom. Keep your bedroom cool, dark, and quiet. Use a supportive mattress and pillows. Follow your health care provider's instructions for other changes to sleep habits. Nutrition Do not eat heavy meals in the evening. Do not have caffeine in the later part of the day. The effects of caffeine can last for more than 5 hours. Follow your health care provider's or dietitian's instructions for any diet changes. Lifestyle     Do not drink alcohol before bedtime. Alcohol can cause you to fall asleep at first, but then it can cause you to wake up in the middle of the night and have trouble getting back to sleep. Do not use any products that contain nicotine or tobacco. These products include cigarettes, chewing tobacco, and vaping devices, such as e-cigarettes. If you need help quitting, ask your health care provider. Medicines Take over-the-counter   and prescription medicines only as told by your health care provider. Do not use over-the-counter sleep medicine. You can become dependent on this medicine, and it can make sleep apnea worse. Do not use medicines, such as sedatives and narcotics, unless told by your health care provider. Activity Exercise on most days, but avoid exercising in the evening. Exercising near bedtime can interfere with  sleeping. If possible, spend time outside every day. Natural light helps regulate your circadian rhythm. General information Lose weight if you need to, and maintain a healthy weight. Keep all follow-up visits. This is important. If you are having surgery, make sure to tell your health care provider that you have sleep apnea. You may need to bring your device with you. Where to find more information Learn more about sleep apnea and daytime fatigue from: American Sleep Association: sleepassociation.org National Sleep Foundation: sleepfoundation.org National Heart, Lung, and Blood Institute: nhlbi.nih.gov Summary Sleep apnea is a condition in which breathing pauses or becomes shallow during sleep. Sleep apnea can cause daytime fatigue and other serious health conditions. You may need to wear a device while sleeping to help keep your airway open. If you are having surgery, make sure to tell your health care provider that you have sleep apnea. You may need to bring your device with you. Making changes to sleep habits, diet, lifestyle, and activity can help you manage sleep apnea. This information is not intended to replace advice given to you by your health care provider. Make sure you discuss any questions you have with your healthcare provider. Document Revised: 06/01/2020 Document Reviewed: 06/01/2020 Elsevier Patient Education  2022 [employer]vier Inc.  

## 2020-12-31 NOTE — Progress Notes (Signed)
BP 128/81   Pulse 61   Temp 97.6 F (36.4 C) (Oral)   Wt 205 lb (93 kg)   SpO2 94%   BMI 29.41 kg/m    Subjective:    Patient ID: Andrew Cobb, male    DOB: 15-Nov-1950, 70 y.o.   MRN: [id number]  HPI: Andrew Cobb is a 70 y.o. male  Chief Complaint  Patient presents with   Obstructive Sleep Apnea    Patient states he is here to discuss having his current C-Pap replaced and he states that he was told that it could be up to 4 months before he receives a replacement and would like to discuss with provider.    SLEEP APNEA Would like CPAP equipment replaced.  Recently saw neurology on 12/26/20, Dr. Rexene Alberts -- note reviewed.  Currently uses and Air Sense from [facility] which was set-up on 07/24/2014.  On review of neurology note he has excellence compliance on their review of his machine with average usage 8 hours and 42 minutes.  On review if he would like a new machine he may require a repeat sleep study, which he could pursue at home -- but at this time his current machine is working well.  He is to follow-up with them in one year.    He reports he needs a "new CPAP", states his current machine reports needing a new machine.  He would prefer not to have another sleep study at this time.  Current setting on machine is 7 and he would like new machine sent to [facility] in [place] -- [address], [address] -- [phone]. Sleep apnea status: controlled Duration: chronic Satisfied with current treatment?:  yes CPAP use:  yes Sleep quality with CPAP use: excellent Treament compliance:excellent compliance Last sleep study: 2016 Treatments attempted: CPAP Wakes feeling refreshed:  yes Daytime hypersomnolence:  no Fatigue:  no Insomnia:  no Good sleep hygiene:  yes Difficulty falling asleep:  no Difficulty staying asleep:  no Snoring bothers bed partner:  no Observed apnea by bed partner: no Obesity:  no Hypertension: no  Pulmonary hypertension:  no Coronary artery disease:   no   Relevant past medical, surgical, family and social history reviewed and updated as indicated. Interim medical history since our last visit reviewed. Allergies and medications reviewed and updated.  Review of Systems  Constitutional:  Negative for activity change, diaphoresis, fatigue and fever.  Respiratory:  Negative for cough, chest tightness, shortness of breath and wheezing.   Cardiovascular:  Negative for chest pain, palpitations and leg swelling.  Gastrointestinal: Negative.   Endocrine: Negative for polydipsia, polyphagia and polyuria.  Neurological: Negative.   Psychiatric/Behavioral: Negative.     Per HPI unless specifically indicated above     Objective:    BP 128/81   Pulse 61   Temp 97.6 F (36.4 C) (Oral)   Wt 205 lb (93 kg)   SpO2 94%   BMI 29.41 kg/m   Wt Readings from Last 3 Encounters:  12/31/20 205 lb (93 kg)  12/26/20 205 lb (93 kg)  12/13/20 203 lb 12.8 oz (92.4 kg)    Physical Exam Vitals and nursing note reviewed.  Constitutional:      General: He is awake. He is not in acute distress.    Appearance: He is well-developed and well-groomed. He is not ill-appearing.  HENT:     Head: Normocephalic and atraumatic.     Right Ear: Hearing normal. No drainage.     Left Ear: Hearing  normal. No drainage.     Nose:     Comments: Mask in place. Eyes:     General: Lids are normal.        Right eye: No discharge.        Left eye: No discharge.     Conjunctiva/sclera: Conjunctivae normal.     Pupils: Pupils are equal, round, and reactive to light.  Neck:     Thyroid: No thyromegaly.     Vascular: No carotid bruit or JVD.     Trachea: Trachea normal.  Cardiovascular:     Rate and Rhythm: Normal rate and regular rhythm.     Heart sounds: Normal heart sounds, S1 normal and S2 normal. No murmur heard.   No gallop.  Pulmonary:     Effort: Pulmonary effort is normal. No accessory muscle usage or respiratory distress.     Breath sounds: Normal breath  sounds.  Abdominal:     General: Bowel sounds are normal.     Palpations: Abdomen is soft. There is no hepatomegaly or splenomegaly.  Musculoskeletal:        General: Normal range of motion.     Cervical back: Normal range of motion and neck supple.     Right lower leg: No edema.     Left lower leg: No edema.  Skin:    General: Skin is warm and dry.     Capillary Refill: Capillary refill takes less than 2 seconds.  Neurological:     Mental Status: He is alert and oriented to person, place, and time.     Deep Tendon Reflexes: Reflexes are normal and symmetric.  Psychiatric:        Attention and Perception: Attention normal.        Mood and Affect: Mood normal.        Speech: Speech normal.        Behavior: Behavior normal. Behavior is cooperative.        Thought Content: Thought content normal.   Results for orders placed or performed in visit on 12/10/20  [facility] [place]  Result Value Ref Range   Date Time Interrogation Session [id number]    Pulse Generator Manufacturer MERM    Pulse Gen Model [id number] Reveal LINQ    Pulse Gen Serial Number [id number] [place] [facility] Name [facility]    Implantable Pulse Generator Type ICM/ILR    Implantable Pulse Generator Implant Date [id number]    Eval Rhythm SR at 66 bpm       Assessment & Plan:   Problem List Items Addressed This Visit       Respiratory   OSA (obstructive sleep apnea)    Chronic, ongoing.  Continue 100% adherence to CPAP use.  Will work on obtaining new machine and equipment for patient per his request.         Follow up plan: Return for as scheduled in upcoming months.

## 2021-01-01 NOTE — Telephone Encounter (Signed)
Paperwork and prescription is placed in provider for signature.

## 2021-01-01 NOTE — Progress Notes (Signed)
Carelink Summary Report / Loop Recorder 

## 2021-01-01 NOTE — Telephone Encounter (Signed)
Please advise 

## 2021-01-01 NOTE — Telephone Encounter (Signed)
Pt's wife called to say th place that they gave Jolene for the Bi-Pap is not selling them anymore. They have a fax number for another place where they can get one.  This is [facility] where thy want o get it from.[name] Kitchen  FAX# [phone]  Pt's #  [phone]

## 2021-01-14 ENCOUNTER — Ambulatory Visit (INDEPENDENT_AMBULATORY_CARE_PROVIDER_SITE_OTHER): Payer: PPO

## 2021-01-14 DIAGNOSIS — I639 Cerebral infarction, unspecified: Secondary | ICD-10-CM | POA: Diagnosis not present

## 2021-01-14 LAB — CUP PACEART REMOTE DEVICE CHECK
Date Time Interrogation Session: 20220705031650
Implantable Pulse Generator Implant Date: 20200316

## 2021-01-31 ENCOUNTER — Encounter (INDEPENDENT_AMBULATORY_CARE_PROVIDER_SITE_OTHER): Payer: Self-pay | Admitting: Ophthalmology

## 2021-01-31 ENCOUNTER — Ambulatory Visit (INDEPENDENT_AMBULATORY_CARE_PROVIDER_SITE_OTHER): Payer: PPO | Admitting: Ophthalmology

## 2021-01-31 ENCOUNTER — Telehealth: Payer: Self-pay | Admitting: Adult Health

## 2021-01-31 ENCOUNTER — Other Ambulatory Visit: Payer: Self-pay

## 2021-01-31 DIAGNOSIS — H35721 Serous detachment of retinal pigment epithelium, right eye: Secondary | ICD-10-CM | POA: Diagnosis not present

## 2021-01-31 DIAGNOSIS — H353211 Exudative age-related macular degeneration, right eye, with active choroidal neovascularization: Secondary | ICD-10-CM | POA: Diagnosis not present

## 2021-01-31 MED ORDER — BEVACIZUMAB 2.5 MG/0.1ML IZ SOSY
2.5000 mg | PREFILLED_SYRINGE | INTRAVITREAL | Status: AC | PRN
  Administered 2021-01-31: 2.5 mg via INTRAVITREAL
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
## 2021-01-31 NOTE — Assessment & Plan Note (Signed)
Active CNVM VM with serous subretinal detachment with preserved acuity yet still active disease on intravitreal Avastin, may need consider change to [facility].  Will have patient apply for co-pay assistance programs

## 2021-01-31 NOTE — Progress Notes (Signed)
01/31/2021     CHIEF COMPLAINT Patient presents for Retina Follow Up (6 week fu OD and Avastin OD/Pt states VA OU stable since last visit. Pt denies FOL, floaters, or ocular pain OU. Karie [place]: 6.7/LBS: 113/)   HISTORY OF PRESENT ILLNESS: Andrew Cobb is a 70 y.o. male who presents to the clinic today for:   HPI     Retina Follow Up           Diagnosis: Wet AMD   Laterality: right eye   Onset: 6 weeks ago   Severity: mild   Duration: 6 weeks   Course: stable   Comments: 6 week fu OD and Avastin OD Pt states VA OU stable since last visit. Pt denies FOL, floaters, or ocular pain OU.  A1C: 6.7 LBS: 113        Last edited by Kendra Opitz, COA on 01/31/2021  8:33 AM.      Referring physician: [name], NP [place],   [id number]  HISTORICAL INFORMATION:   Selected notes from the MEDICAL RECORD NUMBER    Lab Results  Component Value Date   HGBA1C 6.8 11/23/2020     CURRENT MEDICATIONS: No current outpatient medications on file. (Ophthalmic Drugs)   No current facility-administered medications for this visit. (Ophthalmic Drugs)   Current Outpatient Medications (Other)  Medication Sig   acyclovir (ZOVIRAX) 400 MG tablet Take 1 tablet (400 mg total) by mouth 2 (two) times daily.   amitriptyline (ELAVIL) 25 MG tablet Take 1 tablet (25 mg total) by mouth at bedtime.   aspirin EC 81 MG EC tablet Take 1 tablet (81 mg total) by mouth daily.   atorvastatin (LIPITOR) 40 MG tablet Take 1 tablet (40 mg total) by mouth at bedtime.   betamethasone dipropionate (DIPROLENE) 0.05 % ointment Apply 1 application topically 2 (two) times daily as needed for rash.   cetirizine (ZYRTEC) 10 MG tablet Take 10 mg by mouth daily.   Cholecalciferol (VITAMIN D) 2000 units CAPS Take 2,000 Units by mouth daily.   Cyanocobalamin (B-12) 5000 MCG CAPS Take 5,000 mcg by mouth every other day.   empagliflozin (JARDIANCE) 10 MG TABS tablet Take 1 tablet (10 mg total) by mouth  daily before breakfast.   Lancets (ONETOUCH ULTRASOFT) lancets USE 1  TO CHECK GLUCOSE TWICE DAILY   levETIRAcetam (KEPPRA) 500 MG tablet Take 1 tablet (500 mg total) by mouth 2 (two) times daily.   metFORMIN (GLUCOPHAGE) 1000 MG tablet Take 1 tablet (1,000 mg total) by mouth 2 (two) times daily with a meal.   Multiple Vitamins-Minerals (MULTIVITAMIN PO) Take 1 tablet by mouth daily.    ONETOUCH ULTRA test strip USE TWICE DAILY   tamsulosin (FLOMAX) 0.4 MG CAPS capsule Take 1 capsule (0.4 mg total) by mouth at bedtime.   telmisartan (MICARDIS) 80 MG tablet Take 0.5 tablets (40 mg total) by mouth daily.   No current facility-administered medications for this visit. (Other)      REVIEW OF SYSTEMS:    ALLERGIES Allergies  Allergen Reactions   Succinylsulphathiazole Rash   Sulfamethoxazole-Trimethoprim Rash   Tetracyclines & Related Rash    PAST MEDICAL HISTORY Past Medical History:  Diagnosis Date   Arthritis    Asthma    has not needed since 6/21   Cancer [facility])    prostate   Chronic venous insufficiency    varicose vein lower extremity with inflammation   Coronary artery disease 1996   two stents placed  Diabetes mellitus without complication ([place])    type 2 on metformin   GERD (gastroesophageal reflux disease)    no issues since gastric bypass surgery as stated per pt   Hyperlipidemia    Hypertension    Hypogonadism in male    MRSA (methicillin resistant Staphylococcus aureus) infection    07/30/2008 thru 08/07/2008  abdominal abcess   Sleep apnea    on BIPAP   Stented coronary artery    Stroke ([place]) 09/07/2019   no defecits   Thrombocythemia    Past Surgical History:  Procedure Laterality Date   ANGIOPLASTY     ANGIOPLASTY     with stent 04/07/1995   ANTERIOR CERVICAL DECOMPRESSION/DISCECTOMY FUSION 4 LEVELS N/A 08/17/2017   Procedure: Anterior discectomy with fusion and plate fixation Cervical Three-Four, Four-Five, Five-Six, and Six-Seven Fusion;  Surgeon:  Ditty, Kevan Ny, MD;  Location: [facility];  Service: Neurosurgery;  Laterality: N/A;  Anterior discectomy with fusion and plate fixation Cervical Three-Four, Four-Five, Five-Six, and Six-Seven Fusion    APPENDECTOMY     1966   BUBBLE STUDY  09/20/2018   Procedure: BUBBLE STUDY;  Surgeon: Josue Hector, MD;  Location: [facility] ENDOSCOPY;  Service: Cardiovascular;;   [place] TEST     07/31/2011   CARPAL TUNNEL RELEASE Left    CHOLECYSTECTOMY     2006   colonscopy      08/25/2012   EYE SURGERY Bilateral    cataract   FUNCTIONAL ENDOSCOPIC SINUS SURGERY     11/10/2013   GASTRIC BYPASS     10/05/2012   HERNIA REPAIR     left inguinal 1981   INCISION AND DRAINAGE ABSCESS Right 02/26/2017   Procedure: INCISION AND DRAINAGE ABSCESS;  Surgeon: Nickie Retort, MD;  Location: [facility] ORS;  Service: Urology;  Laterality: Right;   JOINT REPLACEMENT     bilateral   left ankle surgery      05/03/2003    left carpel tunnel      09/18/1993   left knee meniscal tear      01/25/2010   left knee meniscal tear repair      05/04/1996   left rotator cuff repair      05/03/2003    LOOP RECORDER INSERTION N/A 09/20/2018   Procedure: LOOP RECORDER INSERTION;  Surgeon: Evans Lance, MD;  Location: [facility] CV LAB;  Service: Cardiovascular;  Laterality: N/A;   REPLACEMENT TOTAL KNEE BILATERAL  07/13/2015   REVERSE SHOULDER ARTHROPLASTY Left 08/30/2020   Procedure: REVERSE SHOULDER ARTHROPLASTY;  Surgeon: Justice Britain, MD;  Location: [facility] ORS;  Service: Orthopedics;  Laterality: Left;  164mn   right ankle surgery      fracture has 2 screws 07/07/1997   right carpel tunnel      05/16/1992   right shoulder replacement      01/27/2006   SCROTAL EXPLORATION Right 02/26/2017   Procedure: SCROTUM EXPLORATION;  Surgeon: BNickie Retort MD;  Location: [facility] ORS;  Service: Urology;  Laterality: Right;   TEE WITHOUT CARDIOVERSION N/A 09/20/2018   Procedure:  TRANSESOPHAGEAL ECHOCARDIOGRAM (TEE);  Surgeon: NJosue Hector MD;  [facility];  Service: Cardiovascular;  Laterality: N/A;   TOTAL KNEE ARTHROPLASTY Left 07/13/2015   Procedure: LEFT TOTAL KNEE ARTHROPLASTY;  Surgeon: FGaynelle Arabian MD;  Location: [facility] ORS;  Service: Orthopedics;  Laterality: Left;    FAMILY HISTORY Family History  Problem Relation Age of Onset   Cancer Mother  pancreatic   Diabetes Mother    Stroke Mother    Heart disease Mother    Hyperlipidemia Mother    Hypertension Mother    Heart attack Mother    Heart disease Father    Stroke Father    Diabetes Father    Hypertension Father    Heart attack Father    Hyperlipidemia Father    Pancreatic cancer Father    Cancer Sister    Cancer Brother        lung   Cancer Brother    Kidney cancer Neg Hx    Bladder Cancer Neg Hx    Prostate cancer Neg Hx     SOCIAL HISTORY Social History   Tobacco Use   Smoking status: Former    Packs/day: 1.00    Years: 10.00    Pack years: 10.00    Types: Cigarettes    Quit date: 07/07/1984    Years since quitting: 36.5   Smokeless tobacco: Never   Tobacco comments:    quit 1986  Vaping Use   Vaping Use: Never used  Substance Use Topics   Alcohol use: No    Alcohol/week: 0.0 standard drinks   Drug use: No         OPHTHALMIC EXAM:  Base Eye Exam     Visual Acuity (ETDRS)       Right Left   Dist [place] 20/40 20/20 -1   Dist ph [place] 20/25 -1          Tonometry (Tonopen, 8:36 AM)       Right Left   Pressure 13 11         Pupils       Pupils Shape React APD   Right PERRL Round Brisk None   Left PERRL Round Brisk None         Visual Fields (Counting fingers)       Left Right    Full Full         Extraocular Movement       Right Left    Full Full         Neuro/Psych     Oriented x3: Yes   Mood/Affect: Normal         Dilation     Right eye: 1.0% Mydriacyl, 2.5% Phenylephrine @ 8:36 AM           Slit Lamp and  Fundus Exam     External Exam       Right Left   External Normal Normal         Slit Lamp Exam       Right Left   Lids/Lashes Normal Normal   Conjunctiva/Sclera White and quiet White and quiet   Cornea Clear Clear   Anterior Chamber Deep and quiet Deep and quiet   Iris Round and reactive Round and reactive   Lens Posterior chamber intraocular lens Posterior chamber intraocular lens   Anterior Vitreous Normal Normal         Fundus Exam       Right Left   Posterior Vitreous Posterior vitreous detachment    Disc Normal    C/D Ratio 0.6    Macula Hard drusen, no exudates, Macular thickening, Soft drusen, Pigmented atrophy, Retinal pigment epithelial mottling, Retinal pigment epithelial detachment    Vessels Normal, , no DR    Periphery Normal             IMAGING AND PROCEDURES  Imaging and Procedures for  01/31/21  OCT, Retina - OU - Both Eyes       Right Eye Quality was good. Scan locations included subfoveal. Central Foveal Thickness: 337. Progression has been stable. Findings include abnormal foveal contour, subretinal fluid, pigment epithelial detachment, no IRF.   Left Eye Quality was good. Scan locations included subfoveal. Central Foveal Thickness: 290. Progression has been stable. Findings include retinal drusen , abnormal foveal contour, no SRF, no IRF.   Notes Today OD at 6 week interval, small change with decreased serous retinal detachment adjacent to the vascularized pigment epithelial detachment nasal to the FAZ.  We will repeat injection today Avastin and examination again in 6 weeks, and consider change in medication OD next     Intravitreal Injection, Pharmacologic Agent - OD - Right Eye       Time Out 01/31/2021. 8:53 AM. Confirmed correct patient, procedure, site, and patient consented.   Anesthesia Topical anesthesia was used. Anesthetic medications included Akten 3.5%.   Procedure Preparation included Tobramycin 0.3%, 10% betadine to  eyelids, 5% betadine to ocular surface. A 30 gauge needle was used.   Injection: 2.5 mg bevacizumab 2.5 MG/0.1ML   Route: Intravitreal, Site: Right Eye   NDC: [phone], Lot: [id number]   Post-op Post injection exam found visual acuity of at least counting fingers. The patient tolerated the procedure well. There were no complications. The patient received written and verbal post procedure care education. Post injection medications were not given.              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  We will repeat today and consider change of medications next visit  Exudative age-related macular degeneration of right eye with active choroidal neovascularization (HCC) Active CNVM VM with serous subretinal detachment with preserved acuity yet still active disease on intravitreal Avastin, may need consider change to [facility].  Will have patient apply for co-pay assistance programs     ICD-10-CM   1. Exudative age-related macular degeneration of right eye with active choroidal neovascularization (HCC)  H35.3211 OCT, Retina - OU - Both Eyes    Intravitreal Injection, Pharmacologic Agent - OD - Right Eye    bevacizumab (AVASTIN) SOSY 2.5 mg    2. Serous detachment of retinal pigment epithelium of right eye  H35.721       1.  OS stable, by OCT evaluation  2.  OD chronic active CNVM subfoveal serous retinal detachment, will active on Avastin currently at 6-week interval.  We will repeat injection today and follow-up next in 6 weeks and consider change of medications  3.  Ophthalmic Meds Ordered this visit:  Meds ordered this encounter  Medications   bevacizumab (AVASTIN) SOSY 2.5 mg       Return in about 6 weeks (around 03/14/2021) for dilate, OD, possible change to [facility] OCT.  There are no Patient  Instructions on file for this visit.   Explained the diagnoses, plan, and follow up with the patient and they expressed understanding.  Patient expressed understanding of the importance of proper follow up care.   [name] Kathyrn Warmuth M.D. Diseases & Surgery of the Retina and Vitreous Retina & Diabetic [place] 01/31/21     Abbreviations: M myopia (nearsighted); A astigmatism; H hyperopia (farsighted); P presbyopia; Mrx spectacle prescription;  CTL contact lenses; OD right eye; OS left eye; OU both eyes  XT exotropia; ET esotropia; PEK punctate epithelial keratitis; PEE punctate epithelial erosions; DES dry eye syndrome; MGD meibomian gland dysfunction; ATs artificial tears; PFAT's preservative free artificial tears; [place] nuclear sclerotic cataract; PSC posterior subcapsular cataract; ERM epi-retinal membrane; PVD posterior vitreous detachment; RD retinal detachment; DM diabetes mellitus; DR diabetic retinopathy; NPDR non-proliferative diabetic retinopathy; PDR proliferative diabetic retinopathy; CSME clinically significant macular edema; DME diabetic macular edema; dbh dot blot hemorrhages; CWS cotton wool spot; POAG primary open angle glaucoma; C/D cup-to-disc ratio; HVF humphrey visual field; GVF goldmann visual field; OCT optical coherence tomography; IOP intraocular pressure; BRVO Branch retinal vein occlusion; CRVO central retinal vein occlusion; CRAO central retinal artery occlusion; BRAO branch retinal artery occlusion; RT retinal tear; SB scleral buckle; PPV pars plana vitrectomy; VH Vitreous hemorrhage; PRP panretinal laser photocoagulation; IVK intravitreal kenalog; VMT vitreomacular traction; MH Macular hole;  NVD neovascularization of the disc; NVE neovascularization elsewhere; AREDS age related eye disease study; ARMD age related macular degeneration; POAG primary open angle glaucoma; EBMD epithelial/anterior basement membrane dystrophy; ACIOL anterior chamber intraocular lens; IOL intraocular  lens; PCIOL posterior chamber intraocular lens; Phaco/IOL phacoemulsification with intraocular lens placement; [place] photorefractive keratectomy; LASIK laser assisted in situ keratomileusis; HTN hypertension; DM diabetes mellitus; COPD chronic obstructive pulmonary disease

## 2021-01-31 NOTE — Telephone Encounter (Signed)
I called pt's wife back and advised this is not typical behavior with Stroke pt's, I also had Jess, NP review who agreed.  Pt will f/u with PCP in regards to behavioral changes noted.  Wife did sts this is the first time she had witnessed something like this happening with her husband.

## 2021-01-31 NOTE — Assessment & Plan Note (Signed)
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  We will repeat today and consider change of medications next visit

## 2021-02-05 NOTE — Progress Notes (Signed)
Carelink Summary Report / Loop Recorder 

## 2021-02-12 ENCOUNTER — Telehealth: Payer: Self-pay

## 2021-02-12 NOTE — Telephone Encounter (Signed)
Order written and placed in providers folder for signature. Will fax as soon as this is signed.

## 2021-02-12 NOTE — Telephone Encounter (Signed)
Copied from [place] [id number]. Topic: General - Other >> Feb 12, 2021 10:47 AM Tessa Lerner A wrote: Reason for CRM: Patient needs an order for a new CPAP machine (their current one is 70 years old) sent to [facility] in [place] via fax [phone]   [facility] My[facility]le Sheng can also be reached via phone at [phone]   Patient shares that they have previously discussed this request on 12/31/20  Please contact further if needed

## 2021-02-12 NOTE — Telephone Encounter (Signed)
Pt's wife called back to report that the patient can use either the Luna or the iBreeze because the restmed is on backorder and they have no idea when they will get any more.   Pt's wife says she wants to make sure that the PCP sends the prescription with the right instructions for the settings. She says the setting is currently on 7.    She is requesting to have this expedited, they are unsure how long they have left with current machine

## 2021-02-13 NOTE — Telephone Encounter (Signed)
Order signed and faxed to [facility] as requested. Called and notified patient's wife that this was done for them.

## 2021-02-18 ENCOUNTER — Ambulatory Visit (INDEPENDENT_AMBULATORY_CARE_PROVIDER_SITE_OTHER): Payer: PPO

## 2021-02-18 DIAGNOSIS — I639 Cerebral infarction, unspecified: Secondary | ICD-10-CM

## 2021-02-18 LAB — CUP PACEART REMOTE DEVICE CHECK
Date Time Interrogation Session: 20220814233319
Implantable Pulse Generator Implant Date: 20200316

## 2021-02-21 ENCOUNTER — Telehealth: Payer: Self-pay | Admitting: Neurology

## 2021-02-21 ENCOUNTER — Encounter: Payer: Self-pay | Admitting: Nurse Practitioner

## 2021-02-21 ENCOUNTER — Other Ambulatory Visit: Payer: Self-pay

## 2021-02-21 ENCOUNTER — Ambulatory Visit (INDEPENDENT_AMBULATORY_CARE_PROVIDER_SITE_OTHER): Payer: PPO | Admitting: Nurse Practitioner

## 2021-02-21 VITALS — BP 136/71 | HR 56 | Temp 97.6°F | Wt 207.4 lb

## 2021-02-21 DIAGNOSIS — I700 Atherosclerosis of aorta: Secondary | ICD-10-CM

## 2021-02-21 DIAGNOSIS — E785 Hyperlipidemia, unspecified: Secondary | ICD-10-CM | POA: Diagnosis not present

## 2021-02-21 DIAGNOSIS — E1169 Type 2 diabetes mellitus with other specified complication: Secondary | ICD-10-CM | POA: Diagnosis not present

## 2021-02-21 DIAGNOSIS — D649 Anemia, unspecified: Secondary | ICD-10-CM | POA: Insufficient documentation

## 2021-02-21 DIAGNOSIS — I152 Hypertension secondary to endocrine disorders: Secondary | ICD-10-CM

## 2021-02-21 DIAGNOSIS — G4733 Obstructive sleep apnea (adult) (pediatric): Secondary | ICD-10-CM | POA: Diagnosis not present

## 2021-02-21 DIAGNOSIS — R569 Unspecified convulsions: Secondary | ICD-10-CM | POA: Diagnosis not present

## 2021-02-21 DIAGNOSIS — E1159 Type 2 diabetes mellitus with other circulatory complications: Secondary | ICD-10-CM

## 2021-02-21 DIAGNOSIS — Z8546 Personal history of malignant neoplasm of prostate: Secondary | ICD-10-CM | POA: Diagnosis not present

## 2021-02-21 DIAGNOSIS — Z8673 Personal history of transient ischemic attack (TIA), and cerebral infarction without residual deficits: Secondary | ICD-10-CM | POA: Diagnosis not present

## 2021-02-21 DIAGNOSIS — H353211 Exudative age-related macular degeneration, right eye, with active choroidal neovascularization: Secondary | ICD-10-CM | POA: Diagnosis not present

## 2021-02-21 DIAGNOSIS — E1165 Type 2 diabetes mellitus with hyperglycemia: Secondary | ICD-10-CM | POA: Diagnosis not present

## 2021-02-21 LAB — BAYER DCA HB A1C WAIVED: HB A1C (BAYER DCA - WAIVED): 6.8 % (ref <7.0)

## 2021-02-21 MED ORDER — ATORVASTATIN CALCIUM 40 MG PO TABS: 40.0000 mg | ORAL_TABLET | Freq: Every day | ORAL | 4 refills | Status: DC

## 2021-02-21 MED ORDER — AMITRIPTYLINE HCL 25 MG PO TABS: 25.0000 mg | ORAL_TABLET | Freq: Every day | ORAL | 4 refills | Status: DC

## 2021-02-21 NOTE — Assessment & Plan Note (Signed)
Being followed by neurology.  Continue current Keppra dose as prescribed by them and collaboration with neurology, reviewed notes.   

## 2021-02-21 NOTE — Assessment & Plan Note (Signed)
Chronic, ongoing with A1C in May 6.8% (upward trend), plus urine ALB 30 and A:C <30, recheck A1c today.   Continue Telmisartan for kidney protection. Neuro goal <6.5%, recommend he take medication daily and focus heavily on diet.  Continue  Metformin 1000 MG BID and continue Jardiance 10 MG, will increase Jardiance if A1C elevation noted today.  Recommend he check BS twice daily + continue focus on regular exercise and diet.  Return in 4 months for A1C check.

## 2021-02-21 NOTE — Assessment & Plan Note (Signed)
Chronic, ongoing.  Continue ASA and maintaining tight lipid control, may need to increase Atorvastatin to 80 MG if ever elevation, recent LDL in 40's.  Continue current Atorvastatin dosing.  Lipid check next visit.  Refills sent in as needed.

## 2021-02-21 NOTE — Assessment & Plan Note (Addendum)
Continue 100% adherence to CPAP use.  Will work on obtaining new equipment for patient, script sent.

## 2021-02-21 NOTE — Telephone Encounter (Signed)
Given his complex medical history including stroke, heart disease, seizures, I would not favor any type of surgical treatment for sleep apnea in his case.  In addition, inspire treatment is typically reserved for patients who are intolerant of CPAP therapy.  I did not get the sense from him that he was intolerant of treatment.  In fact, he was compliant and reported that he had benefited from treatment when we met in June 2022 Please call patient and advise him of the above.

## 2021-02-21 NOTE — Telephone Encounter (Signed)
Pt would like a call to know if Andrew Cobb is an option for him and available thru [facility]

## 2021-02-21 NOTE — Assessment & Plan Note (Signed)
Noted on recent labs, recheck CBC and iron/ferritin today.  Obtain stool for occult blood.

## 2021-02-21 NOTE — Patient Instructions (Signed)
Living With Sleep Apnea Sleep apnea is a condition in which breathing pauses or becomes shallow during sleep. Sleep apnea is most commonly caused by a collapsed or blocked airway. People with sleep apnea usually snore loudly. They may have times when they gasp and stop breathing for 10 seconds or more during sleep. This may happenmany times during the night. The breaks in breathing also interrupt the deep sleep that you need to feel rested. Even if you do not completely wake up from the gaps in breathing, your sleep may not be restful and you feel tired during the day. You may also have a headache in the morning and low energy during the day, and you may feel anxiousor depressed. How can sleep apnea affect me? Sleep apnea increases your chances of extreme tiredness during the day (daytime fatigue). It can also increase your risk for health conditions, such as: Heart attack. Stroke. Obesity. Type 2 diabetes. Heart failure. Irregular heartbeat. High blood pressure. If you have daytime fatigue as a result of sleep apnea, you may be more likely to: Perform poorly at school or work. Fall asleep while driving. Have difficulty with attention. Develop depression or anxiety. Have sexual dysfunction. What actions can I take to manage sleep apnea? Sleep apnea treatment  If you were given a device to open your airway while you sleep, use it only as told by your health care provider. You may be given: An oral appliance. This is a custom-made mouthpiece that shifts your lower jaw forward. A continuous positive airway pressure (CPAP) device. This device blows air through a mask when you breathe out (exhale). A nasal expiratory positive airway pressure (EPAP) device. This device has valves that you put into each nostril. A bi-level positive airway pressure (BPAP) device. This device blows air through a mask when you breathe in (inhale) and breathe out (exhale). You may need surgery if other treatments do  not work for you.  Sleep habits Go to sleep and wake up at the same time every day. This helps set your internal clock (circadian rhythm) for sleeping. If you stay up later than usual, such as on weekends, try to get up in the morning within 2 hours of your normal wake time. Try to get at least 7-9 hours of sleep each night. Stop using a computer, tablet, and mobile phone a few hours before bedtime. Do not take long naps during the day. If you nap, limit it to 30 minutes. Have a relaxing bedtime routine. Reading or listening to music may relax you and help you sleep. Use your bedroom only for sleep. Keep your television and computer out of your bedroom. Keep your bedroom cool, dark, and quiet. Use a supportive mattress and pillows. Follow your health care provider's instructions for other changes to sleep habits. Nutrition Do not eat heavy meals in the evening. Do not have caffeine in the later part of the day. The effects of caffeine can last for more than 5 hours. Follow your health care provider's or dietitian's instructions for any diet changes. Lifestyle     Do not drink alcohol before bedtime. Alcohol can cause you to fall asleep at first, but then it can cause you to wake up in the middle of the night and have trouble getting back to sleep. Do not use any products that contain nicotine or tobacco. These products include cigarettes, chewing tobacco, and vaping devices, such as e-cigarettes. If you need help quitting, ask your health care provider. Medicines Take over-the-counter   and prescription medicines only as told by your health care provider. Do not use over-the-counter sleep medicine. You can become dependent on this medicine, and it can make sleep apnea worse. Do not use medicines, such as sedatives and narcotics, unless told by your health care provider. Activity Exercise on most days, but avoid exercising in the evening. Exercising near bedtime can interfere with  sleeping. If possible, spend time outside every day. Natural light helps regulate your circadian rhythm. General information Lose weight if you need to, and maintain a healthy weight. Keep all follow-up visits. This is important. If you are having surgery, make sure to tell your health care provider that you have sleep apnea. You may need to bring your device with you. Where to find more information Learn more about sleep apnea and daytime fatigue from: American Sleep Association: sleepassociation.org National Sleep Foundation: sleepfoundation.org National Heart, Lung, and Blood Institute: nhlbi.nih.gov Summary Sleep apnea is a condition in which breathing pauses or becomes shallow during sleep. Sleep apnea can cause daytime fatigue and other serious health conditions. You may need to wear a device while sleeping to help keep your airway open. If you are having surgery, make sure to tell your health care provider that you have sleep apnea. You may need to bring your device with you. Making changes to sleep habits, diet, lifestyle, and activity can help you manage sleep apnea. This information is not intended to replace advice given to you by your health care provider. Make sure you discuss any questions you have with your healthcare provider. Document Revised: 06/01/2020 Document Reviewed: 06/01/2020 Elsevier Patient Education  2022 [employer]vier Inc.  

## 2021-02-21 NOTE — Assessment & Plan Note (Signed)
Noted on imaging 07/11/20 and previous imaging.  Recommend continue daily statin therapy and ASA for prevention. 

## 2021-02-21 NOTE — Assessment & Plan Note (Signed)
Chronic, ongoing with BP below neuro goal <130/90.  Continue current medication regimen and adjust as needed.  Recommend he monitor BP at home daily and document.  Continue focus on DASH diet and regular exercise at home.  CBC and BMP today.  Recommend he wear compression hose at home to avoid syncopal episodes and orthostatic BP.  Return in 4 months.

## 2021-02-21 NOTE — Assessment & Plan Note (Signed)
Currently no treatment, continue collaboration with oncology and urology.  Recent notes reviewed. 

## 2021-02-21 NOTE — Progress Notes (Signed)
BP 136/71   Pulse (!) 56   Temp 97.6 F (36.4 C) (Oral)   Wt 207 lb 6.4 oz (94.1 kg)   SpO2 (!) 87%   BMI 29.76 kg/m    Subjective:    Patient ID: CARDEL WERNETTE, male    DOB: 02/19/1951, 70 y.o.   MRN: [id number]  HPI: Andrew Cobb is a 70 y.o. male  Chief Complaint  Patient presents with   Diabetes   Hyperlipidemia   Hypertension   Seizures   Prostate Cancer   DIABETES Last A1C in May was 6.8%, remaining stable. He continues on Metformin 1000 MG BID and Jardiance 10 MG.  Has history of bariatric surgery > 8 years ago.  Followed by ophthalmology for macular degeneration -- saw last 01/31/21. Hypoglycemic episodes:no Polydipsia/polyuria: no Visual disturbance: no Chest pain: no Paresthesias: no Glucose Monitoring: yes             Accucheck frequency: Daily             Fasting glucose: 110-115             Post prandial:             Evening:             Before meals: Taking Insulin?: no             Long acting insulin:             Short acting insulin: Blood Pressure Monitoring: daily Retinal Examination: Up to Date Foot Exam: Up to Date Pneumovax: Up To Date Influenza: Up to Date Aspirin: yes   ANEMIA Recent labs showed mild lowering in H/H -- 11.5/35.1 recent. Anemia status: stable Etiology of anemia: Duration of anemia treatment:  Compliance with treatment: good compliance Iron supplementation side effects: no Severity of anemia: mild Fatigue: no Decreased exercise tolerance: no  Dyspnea on exertion: no Palpitations: no Bleeding: no Pica: no    HYPERTENSION / HYPERLIPIDEMIA Continues Telmisartan 40 MG and Atorvastatin 40 MG daily.  Was noted to have aortic atherosclerosis on imaging 07/11/20.  Continues to use CPAP nightly, but his machine is starting to not work -- is working on [employer] but they are on back order.  Followed by neurology due to history of stroke events x 3. Has loop recorder in place.  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  Saw cardiology last 11/16/20 -- recommended he wear compression hose due to orthostatic changes.   Satisfied with current treatment? yes Duration of hypertension: chronic BP monitoring frequency: daily BP range: 110 -120/70-80 BP medication side effects: no Duration of hyperlipidemia: chronic Cholesterol medication side effects: no Cholesterol supplements: none Medication compliance: good compliance Aspirin: yes Recent stressors: no Recurrent headaches: no Visual changes: no Palpitations: no Dyspnea: yes Chest pain: no Lower extremity edema: no Dizzy/lightheaded: no   HISTORY OF PROSTATE CA: Previously followed by Dr. Bernardo Heater.  Last seen 11/24/2019 and went through IMRT for 40 days, reports he is tolerated this well.  Last treatment was on 02/02/2020.  Saw Dr. Baruch Gouty last 12/06/20, oncology visit -- returns in December.   SEIZURES: Seen by neurology on 12/26/20 and recommendation to continue Keppra 500 MG BID + ASA and Atorvastatin.  No recent seizure activity. Does exercise frequently and stays active.  Continues to have loop recorder in place.    Relevant past medical, surgical, family and social history reviewed and updated as indicated. Interim medical history since our last visit reviewed. Allergies and  medications reviewed and updated.  Review of Systems  Constitutional:  Negative for activity change, diaphoresis, fatigue and fever.  Respiratory:  Negative for cough, chest tightness, shortness of breath and wheezing.   Cardiovascular:  Negative for chest pain, palpitations and leg swelling.  Gastrointestinal: Negative.   Endocrine: Negative for polydipsia, polyphagia and polyuria.  Neurological: Negative.   Psychiatric/Behavioral: Negative.     Per HPI unless specifically indicated above     Objective:    BP 136/71   Pulse (!) 56   Temp 97.6 F (36.4 C) (Oral)   Wt 207 lb 6.4 oz (94.1 kg)   SpO2 (!) 87%   BMI 29.76 kg/m   Wt Readings from Last 3  Encounters:  02/21/21 207 lb 6.4 oz (94.1 kg)  12/31/20 205 lb (93 kg)  12/26/20 205 lb (93 kg)    Physical Exam Vitals and nursing note reviewed.  Constitutional:      General: He is awake. He is not in acute distress.    Appearance: He is well-developed and well-groomed. He is not ill-appearing.  HENT:     Head: Normocephalic and atraumatic.     Right Ear: Hearing normal. No drainage.     Left Ear: Hearing normal. No drainage.     Nose:     Comments: Mask in place. Eyes:     General: Lids are normal.        Right eye: No discharge.        Left eye: No discharge.     Conjunctiva/sclera: Conjunctivae normal.     Pupils: Pupils are equal, round, and reactive to light.  Neck:     Thyroid: No thyromegaly.     Vascular: No carotid bruit or JVD.     Trachea: Trachea normal.  Cardiovascular:     Rate and Rhythm: Normal rate and regular rhythm.     Heart sounds: Normal heart sounds, S1 normal and S2 normal. No murmur heard.   No gallop.  Pulmonary:     Effort: Pulmonary effort is normal. No accessory muscle usage or respiratory distress.     Breath sounds: Normal breath sounds.  Abdominal:     General: Bowel sounds are normal.     Palpations: Abdomen is soft. There is no hepatomegaly or splenomegaly.  Musculoskeletal:        General: Normal range of motion.     Cervical back: Normal range of motion and neck supple.     Right lower leg: No edema.     Left lower leg: No edema.  Skin:    General: Skin is warm and dry.     Capillary Refill: Capillary refill takes less than 2 seconds.  Neurological:     Mental Status: He is alert and oriented to person, place, and time.     Deep Tendon Reflexes: Reflexes are normal and symmetric.  Psychiatric:        Attention and Perception: Attention normal.        Mood and Affect: Mood normal.        Speech: Speech normal.        Behavior: Behavior normal. Behavior is cooperative.        Thought Content: Thought content normal.   Results  for orders placed or performed in visit on 02/18/21  CUP PACEART REMOTE DEVICE CHECK  Result Value Ref Range   Date Time Interrogation Session [id number]    Pulse Generator Manufacturer MERM    Pulse Gen Model [id number] Reveal LINQ  Pulse Gen Serial Number [id number]    Clinic Name [facility]    Implantable Pulse Generator Type ICM/ILR    Implantable Pulse Generator Implant Date [id number]       Assessment & Plan:   Problem List Items Addressed This Visit       Cardiovascular and Mediastinum   Hypertension associated with diabetes ([place])    Chronic, ongoing with BP below neuro goal <130/90.  Continue current medication regimen and adjust as needed.  Recommend he monitor BP at home daily and document.  Continue focus on DASH diet and regular exercise at home.  CBC and BMP today.  Recommend he wear compression hose at home to avoid syncopal episodes and orthostatic BP.  Return in 4 months.      Relevant Medications   atorvastatin (LIPITOR) 40 MG tablet   Other Relevant Orders   Bayer DCA Hb A1c Waived   Exudative age-related macular degeneration of right eye with active choroidal neovascularization (HCC)    Chronic, ongoing, followed by opthalmology.  Continue this collaboration.      Relevant Medications   atorvastatin (LIPITOR) 40 MG tablet   Aortic atherosclerosis ([place])    Noted on imaging 07/11/20 and previous imaging.  Recommend continue daily statin therapy and ASA for prevention.      Relevant Medications   atorvastatin (LIPITOR) 40 MG tablet     Respiratory   OSA (obstructive sleep apnea)    Continue 100% adherence to CPAP use.  Will work on obtaining new equipment for patient, script sent.      Relevant Orders   For home use only DME continuous positive airway pressure (CPAP)     Endocrine   Uncontrolled type 2 diabetes mellitus with hyperglycemia, without long-term current use of insulin (HCC) - Primary    Chronic, ongoing with A1C in May 6.8% (upward trend),  plus urine ALB 30 and A:C <30, recheck A1c today.   Continue Telmisartan for kidney protection. Neuro goal <6.5%, recommend he take medication daily and focus heavily on diet.  Continue  Metformin 1000 MG BID and continue Jardiance 10 MG, will increase Jardiance if A1C elevation noted today.  Recommend he check BS twice daily + continue focus on regular exercise and diet.  Return in 4 months for A1C check.      Relevant Medications   atorvastatin (LIPITOR) 40 MG tablet   Other Relevant Orders   Basic metabolic panel   Bayer DCA Hb A1c Waived   Hyperlipidemia associated with type 2 diabetes mellitus (HCC)    Chronic, ongoing.  Continue ASA and maintaining tight lipid control, may need to increase Atorvastatin to 80 MG if ever elevation, recent LDL in 40's.  Continue current Atorvastatin dosing.  Lipid check next visit.  Refills sent in as needed.      Relevant Medications   atorvastatin (LIPITOR) 40 MG tablet   Other Relevant Orders   Bayer DCA Hb A1c Waived     Other   History of prostate cancer    Currently no treatment, continue collaboration with oncology and urology.  Recent notes reviewed.      History of stroke    Continue collaboration with neurology due to significant stroke history.  Continue current medication regimen.      Seizures ([place])    Being followed by neurology.  Continue current Keppra dose as prescribed by them and collaboration with neurology, reviewed notes.      Low hemoglobin    Noted on recent labs, recheck CBC  and iron/ferritin today.  Obtain stool for occult blood.      Relevant Orders   CBC with Differential/Platelet   Iron, TIBC and Ferritin Panel   Fecal occult blood, imunochemical     Follow up plan: Return in about 4 months (around 06/21/2021) for Annual physical due after 06/20/21.

## 2021-02-21 NOTE — Assessment & Plan Note (Signed)
Continue collaboration with neurology due to significant stroke history.  Continue current medication regimen. 

## 2021-02-21 NOTE — Assessment & Plan Note (Signed)
Chronic, ongoing, followed by opthalmology.  Continue this collaboration. 

## 2021-02-21 NOTE — Telephone Encounter (Signed)
I spoke with the patient and provided Dr. Guadelupe Sabin message below that she would not recommend inspire in his case.  In addition inspire is typically reserved for patients who are intolerant of CPAP therapy and she did not get the sense that he was intolerant. Patient verbalized understanding.  He said he just thought he would ask since he has been having sleep apnea for 20+ years.  He verbalized appreciation for the call and also added that he has been waiting on a new machine from his DME company.

## 2021-02-22 ENCOUNTER — Encounter: Payer: Self-pay | Admitting: Nurse Practitioner

## 2021-02-22 DIAGNOSIS — D696 Thrombocytopenia, unspecified: Secondary | ICD-10-CM | POA: Insufficient documentation

## 2021-02-22 LAB — BASIC METABOLIC PANEL
BUN/Creatinine Ratio: 23 (ref 10–24)
BUN: 25 mg/dL (ref 8–27)
CO2: 24 mmol/L (ref 20–29)
Calcium: 9.5 mg/dL (ref 8.6–10.2)
Chloride: 101 mmol/L (ref 96–106)
Creatinine, Ser: 1.11 mg/dL (ref 0.76–1.27)
Glucose: 112 mg/dL — ABNORMAL HIGH (ref 65–99)
Potassium: 4.5 mmol/L (ref 3.5–5.2)
Sodium: 140 mmol/L (ref 134–144)
eGFR: 71 mL/min/{1.73_m2} (ref >59)

## 2021-02-22 LAB — CBC WITH DIFFERENTIAL/PLATELET
Basophils Absolute: 0 10*3/uL (ref 0.0–0.2)
Basos: 1 %
EOS (ABSOLUTE): 0.3 10*3/uL (ref 0.0–0.4)
Eos: 8 %
Hematocrit: 42.7 % (ref 37.5–51.0)
Hemoglobin: 13.8 g/dL (ref 13.0–17.7)
Immature Grans (Abs): 0 10*3/uL (ref 0.0–0.1)
Immature Granulocytes: 0 %
Lymphocytes Absolute: 0.9 10*3/uL (ref 0.7–3.1)
Lymphs: 22 %
MCH: 30.1 pg (ref 26.6–33.0)
MCHC: 32.3 g/dL (ref 31.5–35.7)
MCV: 93 fL (ref 79–97)
Monocytes Absolute: 0.4 10*3/uL (ref 0.1–0.9)
Monocytes: 10 %
Neutrophils Absolute: 2.5 10*3/uL (ref 1.4–7.0)
Neutrophils: 59 %
Platelets: 132 10*3/uL — ABNORMAL LOW (ref 150–450)
RBC: 4.58 x10E6/uL (ref 4.14–5.80)
RDW: 12.3 % (ref 11.6–15.4)
WBC: 4.3 10*3/uL (ref 3.4–10.8)

## 2021-02-22 LAB — IRON,TIBC AND FERRITIN PANEL
Ferritin: 49 ng/mL (ref 30–400)
Iron Saturation: 31 % (ref 15–55)
Iron: 108 ug/dL (ref 38–169)
Total Iron Binding Capacity: 349 ug/dL (ref 250–450)
UIBC: 241 ug/dL (ref 111–343)

## 2021-02-22 NOTE — Progress Notes (Signed)
Contacted via MyChart   Good evening Andrew Cobb, your labs have returned.  Anemia is improved this check, but I would recommend to still bring stool sample in for checking.  Platelet count is on lower side this check, which is has been on occasion in past.  We will continue to monitor.  Iron level normal.  Kidney function remains stable.  Any questions? Keep being awesome!!  Thank you for allowing me to participate in your care.  I appreciate you. Kindest regards, Edgerrin Correia

## 2021-02-25 ENCOUNTER — Telehealth: Payer: Self-pay

## 2021-02-25 DIAGNOSIS — R195 Other fecal abnormalities: Secondary | ICD-10-CM

## 2021-02-25 DIAGNOSIS — L280 Lichen simplex chronicus: Secondary | ICD-10-CM | POA: Diagnosis not present

## 2021-02-25 LAB — FECAL OCCULT BLOOD, IMMUNOCHEMICAL: Fecal Occult Bld: POSITIVE — AB

## 2021-02-25 NOTE — Progress Notes (Signed)
Good afternoon, please let Andrew Cobb know his stool did come back positive for blood. With his recent anemia, which is now improving, I would still like him to see GI.  Who did you see for your last colonoscopy in 2014?  I would like to place referral to same person.  Any questions? Keep being stellar [place]!!  Thank you for allowing me to participate in your care.  I appreciate you. Kindest regards, Emanuelle Hammerstrom

## 2021-02-25 NOTE — Addendum Note (Signed)
Addended by: [name] on: 02/25/2021 04:48 PM   Modules accepted: Orders

## 2021-02-25 NOTE — Telephone Encounter (Signed)
-----   Message from Jerene Pitch, [place] sent at 02/25/2021  4:13 PM EDT ----- Called patient to discuss lab results, no answer, left a voicemail for patient to return my call.  [place] for nurse triage to give lab results if patient calls back.

## 2021-02-25 NOTE — Telephone Encounter (Signed)
Routing to provider for Colonoscopy referral.

## 2021-02-28 ENCOUNTER — Telehealth: Payer: Self-pay | Admitting: Gastroenterology

## 2021-02-28 NOTE — Telephone Encounter (Signed)
Patient lvm and is ready to schedule a colonoscopy.

## 2021-03-01 ENCOUNTER — Telehealth: Payer: Self-pay | Admitting: Gastroenterology

## 2021-03-01 ENCOUNTER — Other Ambulatory Visit: Payer: Self-pay | Admitting: Family Medicine

## 2021-03-01 DIAGNOSIS — E119 Type 2 diabetes mellitus without complications: Secondary | ICD-10-CM

## 2021-03-01 NOTE — Telephone Encounter (Signed)
Duplicate request . Already send to clinic due to expired medicaiton

## 2021-03-01 NOTE — Telephone Encounter (Signed)
Patient called and is ready to schedule his colonoscopy.  

## 2021-03-01 NOTE — Telephone Encounter (Signed)
Requested medication (s) are due for refill today: expired medication  Requested medication (s) are on the active medication list: yes  Last refill:  01/13/20 #100 each 11 refills  Future visit scheduled: yes in 4 months  Notes to clinic:  expired medication. Do you want to renew Rx?     Requested Prescriptions  Pending Prescriptions Disp Refills   ONETOUCH ULTRA test strip [Pharmacy Med Name: OneTouch Ultra Blue In Vitro Strip] 100 each 0    Sig: USE TWICE DAILY     Endocrinology: Diabetes - Testing Supplies Passed - 03/01/2021 10:27 AM      Passed - Valid encounter within last 12 months    Recent Outpatient Visits           1 week ago Uncontrolled type 2 diabetes mellitus with hyperglycemia, without long-term current use of insulin ([place])   [place], [place] T, NP   2 months ago OSA (obstructive sleep apnea)   [place], Henrine Screws T, NP   2 months ago Appointment canceled by hospital   [facility], Megan P, DO   3 months ago Uncontrolled type 2 diabetes mellitus with hyperglycemia, without long-term current use of insulin ([place])   [place], Henrine Screws T, NP   6 months ago Preoperative clearance   [place], [name], NP       Future Appointments             In 4 months [name], [name], NP [facility]   In 4 months  [facility]

## 2021-03-01 NOTE — Telephone Encounter (Signed)
Copied from [place] [phone]. Topic: Quick Communication - Rx Refill/Question >> Mar 01, 2021 10:24 AM Rayann Heman wrote: Medication: Donald Siva test strip [id number]   Has the patient contacted their pharmacy? Yes. Told to call office  (Agent: If no, request that the patient contact the pharmacy for the refill.) (Agent: If yes, when and what did the pharmacy advise?)  Preferred Pharmacy (with phone number or street name): [facility] [place]  Phone:  [phone] Fax:  [phone]     Agent: Please be advised that RX refills may take up to 3 business days. We ask that you follow-up with your pharmacy.

## 2021-03-04 ENCOUNTER — Other Ambulatory Visit (INDEPENDENT_AMBULATORY_CARE_PROVIDER_SITE_OTHER): Payer: Self-pay

## 2021-03-04 DIAGNOSIS — Z1211 Encounter for screening for malignant neoplasm of colon: Secondary | ICD-10-CM

## 2021-03-04 MED ORDER — CLENPIQ 10-3.5-12 MG-GM -GM/160ML PO SOLN: 1.0000 | ORAL | 0 refills | Status: DC

## 2021-03-04 NOTE — Progress Notes (Signed)
Gastroenterology Pre-Procedure Review  Request Date: 03/26/2021 Requesting Physician: Dr. Vicente Males  PATIENT REVIEW QUESTIONS: The patient responded to the following health history questions as indicated:    1. Are you having any GI issues? no 2. Do you have a personal history of Polyps? no 3. Do you have a family history of Colon Cancer or Polyps? no 4. Diabetes Mellitus? yes (TYPE 2) 5. Joint replacements in the past 12 months?yes (SHOULDER ON LEFT SIDE IN FEBRUARY OF 2021) 6. Major health problems in the past 3 months?no 7. Any artificial heart valves, MVP, or defibrillator?no    MEDICATIONS & ALLERGIES:    Patient reports the following regarding taking any anticoagulation/antiplatelet therapy:   Plavix, Coumadin, Eliquis, Xarelto, Lovenox, Pradaxa, Brilinta, or Effient? no Aspirin? yes (81 MG)  Patient confirms/reports the following medications:  Current Outpatient Medications  Medication Sig Dispense Refill   acyclovir (ZOVIRAX) 400 MG tablet Take 1 tablet (400 mg total) by mouth 2 (two) times daily. 180 tablet 4   amitriptyline (ELAVIL) 25 MG tablet Take 1 tablet (25 mg total) by mouth at bedtime. 90 tablet 4   aspirin EC 81 MG EC tablet Take 1 tablet (81 mg total) by mouth daily. 30 tablet 0   atorvastatin (LIPITOR) 40 MG tablet Take 1 tablet (40 mg total) by mouth at bedtime. 90 tablet 4   betamethasone dipropionate (DIPROLENE) 0.05 % ointment Apply 1 application topically 2 (two) times daily as needed for rash.     cetirizine (ZYRTEC) 10 MG tablet Take 10 mg by mouth daily.     Cholecalciferol (VITAMIN D) 2000 units CAPS Take 2,000 Units by mouth daily.     clobetasol ointment (TEMOVATE) 0.05 % Apply topically 2 (two) times daily.     Cyanocobalamin (B-12) 5000 MCG CAPS Take 5,000 mcg by mouth every other day.     empagliflozin (JARDIANCE) 10 MG TABS tablet Take 1 tablet (10 mg total) by mouth daily before breakfast. 90 tablet 3   Lancets (ONETOUCH ULTRASOFT) lancets USE 1  TO  CHECK GLUCOSE TWICE DAILY 100 each 11   levETIRAcetam (KEPPRA) 500 MG tablet Take 1 tablet (500 mg total) by mouth 2 (two) times daily. 180 tablet 3   metFORMIN (GLUCOPHAGE) 1000 MG tablet Take 1 tablet (1,000 mg total) by mouth 2 (two) times daily with a meal. 180 tablet 4   Multiple Vitamins-Minerals (MULTIVITAMIN PO) Take 1 tablet by mouth daily.      ONETOUCH ULTRA test strip USE TWICE DAILY 100 each 11   tamsulosin (FLOMAX) 0.4 MG CAPS capsule Take 1 capsule (0.4 mg total) by mouth at bedtime. 90 capsule 4   telmisartan (MICARDIS) 80 MG tablet Take 0.5 tablets (40 mg total) by mouth daily. 45 tablet 4   No current facility-administered medications for this visit.    Patient confirms/reports the following allergies:  Allergies  Allergen Reactions   Succinylsulphathiazole Rash   Sulfamethoxazole-Trimethoprim Rash   Tetracyclines & Related Rash    No orders of the defined types were placed in this encounter.   AUTHORIZATION INFORMATION Primary Insurance: 1D#: Group #:  Secondary Insurance: 1D#: Group #:  SCHEDULE INFORMATION: Date: 03/26/2021 Time: Location: [place]

## 2021-03-05 ENCOUNTER — Other Ambulatory Visit: Payer: Self-pay | Admitting: Family Medicine

## 2021-03-05 ENCOUNTER — Other Ambulatory Visit: Payer: Self-pay

## 2021-03-05 DIAGNOSIS — E119 Type 2 diabetes mellitus without complications: Secondary | ICD-10-CM

## 2021-03-05 MED ORDER — PEG 3350-KCL-NA BICARB-NACL 420 G PO SOLR: ORAL | 0 refills | Status: DC

## 2021-03-05 NOTE — Telephone Encounter (Signed)
Pt called stating that he is still needing to have this refilled. He states that he only has 3 test strips left. Please advise.

## 2021-03-05 NOTE — Progress Notes (Signed)
[employer]

## 2021-03-06 MED ORDER — ONETOUCH ULTRA VI STRP: 1.0000 | ORAL_STRIP | Freq: Two times a day (BID) | 6 refills | Status: DC

## 2021-03-09 NOTE — Progress Notes (Signed)
Carelink Summary Report / Loop Recorder 

## 2021-03-13 ENCOUNTER — Telehealth: Payer: Self-pay

## 2021-03-14 ENCOUNTER — Ambulatory Visit (INDEPENDENT_AMBULATORY_CARE_PROVIDER_SITE_OTHER): Payer: PPO | Admitting: Ophthalmology

## 2021-03-14 ENCOUNTER — Other Ambulatory Visit: Payer: Self-pay

## 2021-03-14 ENCOUNTER — Encounter (INDEPENDENT_AMBULATORY_CARE_PROVIDER_SITE_OTHER): Payer: Self-pay | Admitting: Ophthalmology

## 2021-03-14 DIAGNOSIS — H353211 Exudative age-related macular degeneration, right eye, with active choroidal neovascularization: Secondary | ICD-10-CM | POA: Diagnosis not present

## 2021-03-14 MED ORDER — AFLIBERCEPT 2MG/0.05ML IZ SOLN FOR KALEIDOSCOPE
2.0000 mg | INTRAVITREAL | Status: AC | PRN
  Administered 2021-03-14: 2 mg via INTRAVITREAL

## 2021-03-14 NOTE — Assessment & Plan Note (Signed)
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

## 2021-03-14 NOTE — Progress Notes (Signed)
03/14/2021     CHIEF COMPLAINT Patient presents for  Chief Complaint  Patient presents with   Retina Follow Up    6 week fu OD and Avastin OD Pt states VA OU stable since last visit. Pt denies FOL, floaters, or ocular pain OU.  A1C: 6.7 LBS: 113       HISTORY OF PRESENT ILLNESS: Andrew Cobb is a 70 y.o. male who presents to the clinic today for:   HPI     Retina Follow Up   Patient presents with  Wet AMD.  In right eye.  This started 6 weeks ago.  Severity is mild.  Duration of 6 weeks.  Since onset it is stable. Additional comments: 6 week fu OD and Avastin OD Pt states VA OU stable since last visit. Pt denies FOL, floaters, or ocular pain OU.  A1C: 6.7 LBS: 113         Comments   6 week fu od oct poss eylea od (drug change). Patient states vision is stable and unchanged since last visit. Denies any new floaters or FOL. BS: "it stays between 110-115"       Last edited by Laurin Coder on 03/14/2021  8:42 AM.      Referring physician: [name], NP [place],  [place] [address]  HISTORICAL INFORMATION:   Selected notes from the MEDICAL RECORD NUMBER    Lab Results  Component Value Date   HGBA1C 6.8 02/21/2021     CURRENT MEDICATIONS: No current outpatient medications on file. (Ophthalmic Drugs)   No current facility-administered medications for this visit. (Ophthalmic Drugs)   Current Outpatient Medications (Other)  Medication Sig   acyclovir (ZOVIRAX) 400 MG tablet Take 1 tablet (400 mg total) by mouth 2 (two) times daily.   amitriptyline (ELAVIL) 25 MG tablet Take 1 tablet (25 mg total) by mouth at bedtime.   aspirin EC 81 MG EC tablet Take 1 tablet (81 mg total) by mouth daily.   atorvastatin (LIPITOR) 40 MG tablet Take 1 tablet (40 mg total) by mouth at bedtime.   betamethasone dipropionate (DIPROLENE) 0.05 % ointment Apply 1 application topically 2 (two) times daily as needed for rash.   cetirizine (ZYRTEC) 10 MG tablet  Take 10 mg by mouth daily.   Cholecalciferol (VITAMIN D) 2000 units CAPS Take 2,000 Units by mouth daily.   clobetasol ointment (TEMOVATE) 0.05 % Apply topically 2 (two) times daily.   Cyanocobalamin (B-12) 5000 MCG CAPS Take 5,000 mcg by mouth every other day.   empagliflozin (JARDIANCE) 10 MG TABS tablet Take 1 tablet (10 mg total) by mouth daily before breakfast.   glucose blood (ONETOUCH ULTRA) test strip 1 each by Other route 2 (two) times daily. Use as instructed   Lancets (ONETOUCH ULTRASOFT) lancets USE 1  TO CHECK GLUCOSE TWICE DAILY   levETIRAcetam (KEPPRA) 500 MG tablet Take 1 tablet (500 mg total) by mouth 2 (two) times daily.   metFORMIN (GLUCOPHAGE) 1000 MG tablet Take 1 tablet (1,000 mg total) by mouth 2 (two) times daily with a meal.   Multiple Vitamins-Minerals (MULTIVITAMIN PO) Take 1 tablet by mouth daily.    polyethylene glycol-electrolytes (NULYTELY) 420 g solution Prepare according to package instructions. Starting at 5:00 PM: Drink one 8 oz glass of mixture every 15 minutes until you finish half of the jug. Five hours prior to procedure, drink 8 oz glass of mixture every 15 minutes until it is all gone. Make sure you  do not drink anything 4 hours prior to your procedure.   tamsulosin (FLOMAX) 0.4 MG CAPS capsule Take 1 capsule (0.4 mg total) by mouth at bedtime.   telmisartan (MICARDIS) 80 MG tablet Take 0.5 tablets (40 mg total) by mouth daily.   No current facility-administered medications for this visit. (Other)      REVIEW OF SYSTEMS:    ALLERGIES Allergies  Allergen Reactions   Succinylsulphathiazole Rash   Sulfamethoxazole-Trimethoprim Rash   Tetracyclines & Related Rash    PAST MEDICAL HISTORY Past Medical History:  Diagnosis Date   Arthritis    Asthma    has not needed since 6/21   Cancer [facility])    prostate   Chronic venous insufficiency    varicose vein lower extremity with inflammation   Coronary artery disease 1996   two stents placed     Diabetes mellitus without complication ([place])    type 2 on metformin   GERD (gastroesophageal reflux disease)    no issues since gastric bypass surgery as stated per pt   Hyperlipidemia    Hypertension    Hypogonadism in male    MRSA (methicillin resistant Staphylococcus aureus) infection    07/30/2008 thru 08/07/2008  abdominal abcess   Sleep apnea    on BIPAP   Stented coronary artery    Stroke ([place]) 09/07/2019   no defecits   Thrombocythemia    Past Surgical History:  Procedure Laterality Date   ANGIOPLASTY     ANGIOPLASTY     with stent 04/07/1995   ANTERIOR CERVICAL DECOMPRESSION/DISCECTOMY FUSION 4 LEVELS N/A 08/17/2017   Procedure: Anterior discectomy with fusion and plate fixation Cervical Three-Four, Four-Five, Five-Six, and Six-Seven Fusion;  Surgeon: Ditty, Kevan Ny, MD;  Location: [place];  Service: Neurosurgery;  Laterality: N/A;  Anterior discectomy with fusion and plate fixation Cervical Three-Four, Four-Five, Five-Six, and Six-Seven Fusion    APPENDECTOMY     1966   BUBBLE STUDY  09/20/2018   Procedure: BUBBLE STUDY;  Surgeon: Josue Hector, MD;  Location: [facility] ENDOSCOPY;  Service: Cardiovascular;;   [place] TEST     07/31/2011   CARPAL TUNNEL RELEASE Left    CHOLECYSTECTOMY     2006   colonscopy      08/25/2012   EYE SURGERY Bilateral    cataract   FUNCTIONAL ENDOSCOPIC SINUS SURGERY     11/10/2013   GASTRIC BYPASS     10/05/2012   HERNIA REPAIR     left inguinal 1981   INCISION AND DRAINAGE ABSCESS Right 02/26/2017   Procedure: INCISION AND DRAINAGE ABSCESS;  Surgeon: Nickie Retort, MD;  Location: [facility] ORS;  Service: Urology;  Laterality: Right;   JOINT REPLACEMENT     bilateral   left ankle surgery      05/03/2003    left carpel tunnel      09/18/1993   left knee meniscal tear      01/25/2010   left knee meniscal tear repair      05/04/1996   left rotator cuff repair      05/03/2003    LOOP RECORDER  INSERTION N/A 09/20/2018   Procedure: LOOP RECORDER INSERTION;  Surgeon: Evans Lance, MD;  Location: [place] CV LAB;  Service: Cardiovascular;  Laterality: N/A;   REPLACEMENT TOTAL KNEE BILATERAL  07/13/2015   REVERSE SHOULDER ARTHROPLASTY Left 08/30/2020   Procedure: REVERSE SHOULDER ARTHROPLASTY;  Surgeon: Justice Britain, MD;  Location: [facility] ORS;  Service: Orthopedics;  Laterality: Left;  124mn   right ankle surgery      fracture has 2 screws 07/07/1997   right carpel tunnel      05/16/1992   right shoulder replacement      01/27/2006   SCROTAL EXPLORATION Right 02/26/2017   Procedure: SCROTUM EXPLORATION;  Surgeon: BNickie Retort MD;  Location: [facility] ORS;  Service: Urology;  Laterality: Right;   TEE WITHOUT CARDIOVERSION N/A 09/20/2018   Procedure: TRANSESOPHAGEAL ECHOCARDIOGRAM (TEE);  Surgeon: NJosue Hector MD;  [facility];  Service: Cardiovascular;  Laterality: N/A;   TOTAL KNEE ARTHROPLASTY Left 07/13/2015   Procedure: LEFT TOTAL KNEE ARTHROPLASTY;  Surgeon: FGaynelle Arabian MD;  Location: [facility] ORS;  Service: Orthopedics;  Laterality: Left;    FAMILY HISTORY Family History  Problem Relation Age of Onset   Cancer Mother        pancreatic   Diabetes Mother    Stroke Mother    Heart disease Mother    Hyperlipidemia Mother    Hypertension Mother    Heart attack Mother    Heart disease Father    Stroke Father    Diabetes Father    Hypertension Father    Heart attack Father    Hyperlipidemia Father    Pancreatic cancer Father    Cancer Sister    Cancer Brother        lung   Cancer Brother    Kidney cancer Neg Hx    Bladder Cancer Neg Hx    Prostate cancer Neg Hx     SOCIAL HISTORY Social History   Tobacco Use   Smoking status: Former    Packs/day: 1.00    Years: 10.00    Pack years: 10.00    Types: Cigarettes    Quit date: 07/07/1984    Years since quitting: 36.7   Smokeless tobacco: Never   Tobacco comments:    quit 1986  Vaping Use   Vaping  Use: Never used  Substance Use Topics   Alcohol use: No    Alcohol/week: 0.0 standard drinks   Drug use: No         OPHTHALMIC EXAM:  Base Eye Exam     Visual Acuity (ETDRS)       Right Left   Dist [place] 20/25 -1 20/20 -1         Tonometry (Tonopen, 8:46 AM)       Right Left   Pressure 10 11         Pupils       Pupils Dark Light APD   Right PERRL 4 3 None   Left PERRL 4 3 None         Extraocular Movement       Right Left    Full Full         Neuro/Psych     Oriented x3: Yes   Mood/Affect: Normal         Dilation     Right eye: 1.0% Mydriacyl, 2.5% Phenylephrine @ 8:46 AM           Slit Lamp and Fundus Exam     External Exam       Right Left   External Normal Normal         Slit Lamp Exam       Right Left   Lids/Lashes Normal Normal   Conjunctiva/Sclera White and quiet White and quiet   Cornea Clear Clear   Anterior Chamber Deep and quiet Deep and  quiet   Iris Round and reactive Round and reactive   Lens Posterior chamber intraocular lens Posterior chamber intraocular lens   Anterior Vitreous Normal Normal         Fundus Exam       Right Left   Posterior Vitreous Posterior vitreous detachment    Disc Normal    C/D Ratio 0.6    Macula Hard drusen, no exudates, Macular thickening, Soft drusen, Pigmented atrophy, Retinal pigment epithelial mottling, Retinal pigment epithelial detachment    Vessels Normal, , no DR    Periphery Normal             IMAGING AND PROCEDURES  Imaging and Procedures for 03/14/21  OCT, Retina - OU - Both Eyes       Right Eye Quality was good. Scan locations included subfoveal. Central Foveal Thickness: 344. Progression has been stable. Findings include abnormal foveal contour, subretinal fluid, pigment epithelial detachment, no IRF.   Left Eye Quality was good. Scan locations included subfoveal. Central Foveal Thickness: 293. Progression has been stable. Findings include retinal drusen  , abnormal foveal contour, no SRF, no IRF.   Notes Today OD at 6 week interval, small change with decreased serous retinal detachment adjacent to the vascularized pigment epithelial detachment nasal to the FAZ.  We will repeat injection today Eylea today next and examination again in 6 weeks, and consider change in medication OD next     Intravitreal Injection, Pharmacologic Agent - OD - Right Eye       Time Out 03/14/2021. 9:29 AM. Confirmed correct patient, procedure, site, and patient consented.   Anesthesia Topical anesthesia was used. Anesthetic medications included [place] 3.5%.   Procedure Preparation included Tobramycin 0.3%, 10% betadine to eyelids, 5% betadine to ocular surface. A 30 gauge needle was used.   Injection: 2 mg aflibercept 2 MG/0.05ML   Route: Intravitreal, Site: Right Eye   NDC: [id number], Lot: [id number], Waste: 0 mL   Post-op Post injection exam found visual acuity of at least counting fingers. The patient tolerated the procedure well. There were no complications. The patient received written and verbal post procedure care education. Post injection medications were not given.              ASSESSMENT/PLAN:  Exudative age-related macular degeneration of right eye with active choroidal neovascularization ([place]) OD stabilized yet with chronic serous subretinal fluid, on Avastin, medication changed to [facility] today     ICD-10-CM   1. Exudative age-related macular degeneration of right eye with active choroidal neovascularization ([facility])  H35.3211 OCT, Retina - OU - Both Eyes    Intravitreal Injection, Pharmacologic Agent - OD - Right Eye    aflibercept (EYLEA) SOLN 2 mg      1.  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  Change in medication today on Avastin to [facility].  Injection Eylea No. 1 today OD carried out  2.  Dilate OD next in 5 to 6 weeks likely injection of Eylea  OD  3.  Ophthalmic Meds Ordered this visit:  Meds ordered this encounter  Medications   aflibercept (EYLEA) SOLN 2 mg       Return in about 5 weeks (around 04/18/2021) for dilate, OD, EYLEA OCT.  There are no Patient Instructions on file for this visit.   Explained the diagnoses, plan, and follow up with the patient and they expressed understanding.  Patient expressed understanding of the importance of proper follow up  care.   [name]. Luana Tatro M.D. Diseases & Surgery of the Retina and Vitreous Retina & Diabetic [place] 03/14/21     Abbreviations: M myopia (nearsighted); A astigmatism; H hyperopia (farsighted); P presbyopia; Mrx spectacle prescription;  CTL contact lenses; OD right eye; OS left eye; OU both eyes  XT exotropia; ET esotropia; PEK punctate epithelial keratitis; PEE punctate epithelial erosions; DES dry eye syndrome; MGD meibomian gland dysfunction; ATs artificial tears; PFAT's preservative free artificial tears; [place] nuclear sclerotic cataract; PSC posterior subcapsular cataract; ERM epi-retinal membrane; PVD posterior vitreous detachment; RD retinal detachment; DM diabetes mellitus; DR diabetic retinopathy; NPDR non-proliferative diabetic retinopathy; PDR proliferative diabetic retinopathy; CSME clinically significant macular edema; DME diabetic macular edema; dbh dot blot hemorrhages; CWS cotton wool spot; POAG primary open angle glaucoma; C/D cup-to-disc ratio; HVF humphrey visual field; GVF goldmann visual field; OCT optical coherence tomography; IOP intraocular pressure; BRVO Branch retinal vein occlusion; CRVO central retinal vein occlusion; CRAO central retinal artery occlusion; BRAO branch retinal artery occlusion; RT retinal tear; SB scleral buckle; PPV pars plana vitrectomy; VH Vitreous hemorrhage; PRP panretinal laser photocoagulation; IVK intravitreal kenalog; VMT vitreomacular traction; MH Macular hole;  NVD neovascularization of the disc; NVE neovascularization  elsewhere; AREDS age related eye disease study; ARMD age related macular degeneration; POAG primary open angle glaucoma; EBMD epithelial/anterior basement membrane dystrophy; ACIOL anterior chamber intraocular lens; IOL intraocular lens; PCIOL posterior chamber intraocular lens; Phaco/IOL phacoemulsification with intraocular lens placement; [place] photorefractive keratectomy; LASIK laser assisted in situ keratomileusis; HTN hypertension; DM diabetes mellitus; COPD chronic obstructive pulmonary disease

## 2021-03-18 DIAGNOSIS — M436 Torticollis: Secondary | ICD-10-CM | POA: Diagnosis not present

## 2021-03-18 DIAGNOSIS — M542 Cervicalgia: Secondary | ICD-10-CM | POA: Diagnosis not present

## 2021-03-18 DIAGNOSIS — M9901 Segmental and somatic dysfunction of cervical region: Secondary | ICD-10-CM | POA: Diagnosis not present

## 2021-03-18 DIAGNOSIS — Z72820 Sleep deprivation: Secondary | ICD-10-CM | POA: Diagnosis not present

## 2021-03-20 DIAGNOSIS — M436 Torticollis: Secondary | ICD-10-CM | POA: Diagnosis not present

## 2021-03-20 DIAGNOSIS — M542 Cervicalgia: Secondary | ICD-10-CM | POA: Diagnosis not present

## 2021-03-20 DIAGNOSIS — M9901 Segmental and somatic dysfunction of cervical region: Secondary | ICD-10-CM | POA: Diagnosis not present

## 2021-03-20 DIAGNOSIS — Z72820 Sleep deprivation: Secondary | ICD-10-CM | POA: Diagnosis not present

## 2021-03-22 DIAGNOSIS — M436 Torticollis: Secondary | ICD-10-CM | POA: Diagnosis not present

## 2021-03-22 DIAGNOSIS — M542 Cervicalgia: Secondary | ICD-10-CM | POA: Diagnosis not present

## 2021-03-22 DIAGNOSIS — Z72820 Sleep deprivation: Secondary | ICD-10-CM | POA: Diagnosis not present

## 2021-03-22 DIAGNOSIS — M9901 Segmental and somatic dysfunction of cervical region: Secondary | ICD-10-CM | POA: Diagnosis not present

## 2021-03-25 ENCOUNTER — Ambulatory Visit (INDEPENDENT_AMBULATORY_CARE_PROVIDER_SITE_OTHER): Payer: PPO

## 2021-03-25 DIAGNOSIS — Z72820 Sleep deprivation: Secondary | ICD-10-CM | POA: Diagnosis not present

## 2021-03-25 DIAGNOSIS — I639 Cerebral infarction, unspecified: Secondary | ICD-10-CM | POA: Diagnosis not present

## 2021-03-25 DIAGNOSIS — M9901 Segmental and somatic dysfunction of cervical region: Secondary | ICD-10-CM | POA: Diagnosis not present

## 2021-03-25 DIAGNOSIS — M436 Torticollis: Secondary | ICD-10-CM | POA: Diagnosis not present

## 2021-03-25 DIAGNOSIS — M542 Cervicalgia: Secondary | ICD-10-CM | POA: Diagnosis not present

## 2021-03-26 ENCOUNTER — Ambulatory Visit: Payer: PPO | Admitting: Certified Registered"

## 2021-03-26 ENCOUNTER — Ambulatory Visit
Admission: RE | Admit: 2021-03-26 | Discharge: 2021-03-26 | Disposition: A | Discharge status: Home / Self Care | Payer: PPO | Attending: Gastroenterology | Admitting: Gastroenterology

## 2021-03-26 ENCOUNTER — Encounter
Admission: RE | Disposition: A | Discharge status: Home / Self Care | Payer: Self-pay | Source: Home / Self Care | Attending: Gastroenterology

## 2021-03-26 ENCOUNTER — Encounter: Payer: Self-pay | Admitting: Gastroenterology

## 2021-03-26 DIAGNOSIS — Z833 Family history of diabetes mellitus: Secondary | ICD-10-CM | POA: Diagnosis not present

## 2021-03-26 DIAGNOSIS — E119 Type 2 diabetes mellitus without complications: Secondary | ICD-10-CM | POA: Diagnosis not present

## 2021-03-26 DIAGNOSIS — Z801 Family history of malignant neoplasm of trachea, bronchus and lung: Secondary | ICD-10-CM | POA: Insufficient documentation

## 2021-03-26 DIAGNOSIS — Z823 Family history of stroke: Secondary | ICD-10-CM | POA: Insufficient documentation

## 2021-03-26 DIAGNOSIS — Z882 Allergy status to sulfonamides status: Secondary | ICD-10-CM | POA: Insufficient documentation

## 2021-03-26 DIAGNOSIS — K635 Polyp of colon: Secondary | ICD-10-CM | POA: Diagnosis not present

## 2021-03-26 DIAGNOSIS — K5730 Diverticulosis of large intestine without perforation or abscess without bleeding: Secondary | ICD-10-CM | POA: Insufficient documentation

## 2021-03-26 DIAGNOSIS — Z881 Allergy status to other antibiotic agents status: Secondary | ICD-10-CM | POA: Diagnosis not present

## 2021-03-26 DIAGNOSIS — Z809 Family history of malignant neoplasm, unspecified: Secondary | ICD-10-CM | POA: Insufficient documentation

## 2021-03-26 DIAGNOSIS — Z7982 Long term (current) use of aspirin: Secondary | ICD-10-CM | POA: Insufficient documentation

## 2021-03-26 DIAGNOSIS — Z9884 Bariatric surgery status: Secondary | ICD-10-CM | POA: Diagnosis not present

## 2021-03-26 DIAGNOSIS — Z8546 Personal history of malignant neoplasm of prostate: Secondary | ICD-10-CM | POA: Diagnosis not present

## 2021-03-26 DIAGNOSIS — K621 Rectal polyp: Secondary | ICD-10-CM | POA: Insufficient documentation

## 2021-03-26 DIAGNOSIS — Z79899 Other long term (current) drug therapy: Secondary | ICD-10-CM | POA: Diagnosis not present

## 2021-03-26 DIAGNOSIS — Z8349 Family history of other endocrine, nutritional and metabolic diseases: Secondary | ICD-10-CM | POA: Diagnosis not present

## 2021-03-26 DIAGNOSIS — Z888 Allergy status to other drugs, medicaments and biological substances status: Secondary | ICD-10-CM | POA: Insufficient documentation

## 2021-03-26 DIAGNOSIS — Z87891 Personal history of nicotine dependence: Secondary | ICD-10-CM | POA: Diagnosis not present

## 2021-03-26 DIAGNOSIS — Z1211 Encounter for screening for malignant neoplasm of colon: Secondary | ICD-10-CM

## 2021-03-26 DIAGNOSIS — Z8249 Family history of ischemic heart disease and other diseases of the circulatory system: Secondary | ICD-10-CM | POA: Diagnosis not present

## 2021-03-26 DIAGNOSIS — Z7984 Long term (current) use of oral hypoglycemic drugs: Secondary | ICD-10-CM | POA: Insufficient documentation

## 2021-03-26 DIAGNOSIS — Z800 Family history of malignant neoplasm of digestive organs: Secondary | ICD-10-CM | POA: Diagnosis not present

## 2021-03-26 DIAGNOSIS — D125 Benign neoplasm of sigmoid colon: Secondary | ICD-10-CM | POA: Insufficient documentation

## 2021-03-26 DIAGNOSIS — K219 Gastro-esophageal reflux disease without esophagitis: Secondary | ICD-10-CM | POA: Diagnosis not present

## 2021-03-26 HISTORY — PX: COLONOSCOPY WITH PROPOFOL: SHX5780

## 2021-03-26 LAB — GLUCOSE, CAPILLARY: Glucose-Capillary: 114 mg/dL — ABNORMAL HIGH (ref 70–99)

## 2021-03-26 SURGERY — COLONOSCOPY WITH PROPOFOL
Anesthesia: General

## 2021-03-26 MED ORDER — PROPOFOL 500 MG/50ML IV EMUL
INTRAVENOUS | Status: DC | PRN
  Administered 2021-03-26: 120 ug/kg/min via INTRAVENOUS

## 2021-03-26 MED ORDER — PROPOFOL 10 MG/ML IV BOLUS
INTRAVENOUS | Status: DC | PRN
  Administered 2021-03-26: 70 mg via INTRAVENOUS

## 2021-03-26 MED ORDER — LIDOCAINE 2% (20 MG/ML) 5 ML SYRINGE
INTRAMUSCULAR | Status: DC | PRN
  Administered 2021-03-26: 25 mg via INTRAVENOUS

## 2021-03-26 MED ORDER — SODIUM CHLORIDE 0.9 % IV SOLN: INTRAVENOUS | Status: DC

## 2021-03-26 NOTE — Op Note (Signed)
[facility] Gastroenterology Patient Name: Andrew Cobb Procedure Date: 03/26/2021 7:52 AM MRN: [id number] Account #: [id number] Date of Birth: [birth year] Admit Type: Outpatient Age: 70 Room: [facility] ENDO ROOM 2 Gender: Male Note Status: Finalized Instrument Name: Jasper Riling [id number] Procedure:             Colonoscopy Indications:           Screening for colorectal malignant neoplasm Providers:             Jonathon Bellows MD, MD Medicines:             Monitored Anesthesia Care Complications:         No immediate complications. Procedure:             Pre-Anesthesia Assessment:                        - Prior to the procedure, a History and Physical was                         performed, and patient medications, allergies and                         sensitivities were reviewed. The patient's tolerance                         of previous anesthesia was reviewed.                        - The risks and benefits of the procedure and the                         sedation options and risks were discussed with the                         patient. All questions were answered and informed                         consent was obtained.                        - ASA Grade Assessment: II - A patient with mild                         systemic disease.                        After obtaining informed consent, the colonoscope was                         passed under direct vision. Throughout the procedure,                         the patient's blood pressure, pulse, and oxygen                         saturations were monitored continuously. The                         Colonoscope was introduced through the anus and  advanced to the the cecum, identified by the                         appendiceal orifice. The colonoscopy was performed                         with ease. The patient tolerated the procedure well.                         The quality of the bowel  preparation was adequate. Findings:      The perianal and digital rectal examinations were normal.      Multiple small-mouthed diverticula were found in the entire colon.      A 8 mm polyp was found in the sigmoid colon. The polyp was sessile. The       polyp was removed with a cold snare. Resection and retrieval were       complete. To prevent bleeding post-intervention, one hemostatic clip was       successfully placed. There was no bleeding at the end of the procedure.      A 7 mm polyp was found in the rectum. The polyp was sessile. The polyp       was removed with a cold snare. Resection was complete, but the polyp       tissue was not retrieved.      The exam was otherwise without abnormality on direct and retroflexion       views. Impression:            - Diverticulosis in the entire examined colon.                        - One 8 mm polyp in the sigmoid colon, removed with a                         cold snare. Resected and retrieved. Clip was placed.                        - One 7 mm polyp in the rectum, removed with a cold                         snare. Complete resection. Polyp tissue not retrieved.                        - The examination was otherwise normal on direct and                         retroflexion views. Recommendation:        - Discharge patient to home (with escort).                        - Resume previous diet.                        - Continue present medications.                        - Await pathology results.                        -  Repeat colonoscopy for surveillance based on                         pathology results. Procedure Code(s):     --- Professional ---                        [phone], Colonoscopy, flexible; with removal of                         tumor(s), polyp(s), or other lesion(s) by snare                         technique Diagnosis Code(s):     --- Professional ---                        Z12.11, Encounter for screening for malignant neoplasm                          of colon                        K63.5, Polyp of colon                        K62.1, Rectal polyp                        K57.30, Diverticulosis of large intestine without                         perforation or abscess without bleeding CPT copyright 2019 [employer]. All rights reserved. The codes documented in this report are preliminary and upon coder review may  be revised to meet current compliance requirements. Jonathon Bellows, MD Jonathon Bellows MD, MD 03/26/2021 8:33:53 AM This report has been signed electronically. Number of Addenda: 0 Note Initiated On: 03/26/2021 7:52 AM Scope Withdrawal Time: 0 hours 17 minutes 58 seconds  Total Procedure Duration: 0 hours 21 minutes 4 seconds  Estimated Blood Loss:  Estimated blood loss: none.      [facility]

## 2021-03-26 NOTE — H&P (Signed)
Jonathon Bellows, MD [address] [place], [place], [name], [address] [address], [place], [place], [name], [address] Phone: [phone]  Fax: [phone]  Primary Care Physician:  [name], NP   Pre-Procedure History & Physical: HPI:  Andrew Cobb is a 70 y.o. male is here for an colonoscopy.   Past Medical History:  Diagnosis Date   Arthritis    Asthma    has not needed since 6/21   Cancer [facility])    prostate   Chronic venous insufficiency    varicose vein lower extremity with inflammation   Coronary artery disease 1996   two stents placed    Diabetes mellitus without complication ([place])    type 2 on metformin   GERD (gastroesophageal reflux disease)    no issues since gastric bypass surgery as stated per pt   Hyperlipidemia    Hypertension    Hypogonadism in male    MRSA (methicillin resistant Staphylococcus aureus) infection    07/30/2008 thru 08/07/2008  abdominal abcess   Sleep apnea    on BIPAP   Stented coronary artery    Stroke ([place]) 09/07/2019   no defecits   Thrombocythemia     Past Surgical History:  Procedure Laterality Date   ANGIOPLASTY     ANGIOPLASTY     with stent 04/07/1995   ANTERIOR CERVICAL DECOMPRESSION/DISCECTOMY FUSION 4 LEVELS N/A 08/17/2017   Procedure: Anterior discectomy with fusion and plate fixation Cervical Three-Four, Four-Five, Five-Six, and Six-Seven Fusion;  Surgeon: Ditty, Kevan Ny, MD;  Location: [place];  Service: Neurosurgery;  Laterality: N/A;  Anterior discectomy with fusion and plate fixation Cervical Three-Four, Four-Five, Five-Six, and Six-Seven Fusion    APPENDECTOMY     1966   BUBBLE STUDY  09/20/2018   Procedure: BUBBLE STUDY;  Surgeon: Josue Hector, MD;  Location: [facility] ENDOSCOPY;  Service: Cardiovascular;;   [place] TEST     07/31/2011   CARPAL TUNNEL RELEASE Left    CHOLECYSTECTOMY     2006   colonscopy      08/25/2012   EYE SURGERY Bilateral     cataract   FUNCTIONAL ENDOSCOPIC SINUS SURGERY     11/10/2013   GASTRIC BYPASS     10/05/2012   HERNIA REPAIR     left inguinal 1981   INCISION AND DRAINAGE ABSCESS Right 02/26/2017   Procedure: INCISION AND DRAINAGE ABSCESS;  Surgeon: Nickie Retort, MD;  Location: [facility] ORS;  Service: Urology;  Laterality: Right;   JOINT REPLACEMENT     bilateral   left ankle surgery      05/03/2003    left carpel tunnel      09/18/1993   left knee meniscal tear      01/25/2010   left knee meniscal tear repair      05/04/1996   left rotator cuff repair      05/03/2003    LOOP RECORDER INSERTION N/A 09/20/2018   Procedure: LOOP RECORDER INSERTION;  Surgeon: Evans Lance, MD;  Location: [place] [facility];  Service: Cardiovascular;  Laterality: N/A;   REPLACEMENT TOTAL KNEE BILATERAL  07/13/2015   REVERSE SHOULDER ARTHROPLASTY Left 08/30/2020   Procedure: REVERSE SHOULDER ARTHROPLASTY;  Surgeon: Justice Britain, MD;  Location: [facility] OR[facility];  Service: Orthopedics;  Laterality: Left;  172min   right ankle surgery      fracture has 2 screws 07/07/1997   right carpel tunnel      05/16/1992  right shoulder replacement      01/27/2006   SCROTAL EXPLORATION Right 02/26/2017   Procedure: SCROTUM EXPLORATION;  Surgeon: Nickie Retort, MD;  Location: [facility] ORS;  Service: Urology;  Laterality: Right;   TEE WITHOUT CARDIOVERSION N/A 09/20/2018   Procedure: TRANSESOPHAGEAL ECHOCARDIOGRAM (TEE);  Surgeon: Josue Hector, MD;  Location: [facility] ENDOSCOPY;  Service: Cardiovascular;  Laterality: N/A;   TOTAL KNEE ARTHROPLASTY Left 07/13/2015   Procedure: LEFT TOTAL KNEE ARTHROPLASTY;  Surgeon: Gaynelle Arabian, MD;  Location: [facility] ORS;  Service: Orthopedics;  Laterality: Left;    Prior to Admission medications   Medication Sig Start Date End Date Taking? Authorizing Provider  acyclovir (ZOVIRAX) 400 MG tablet Take 1 tablet (400 mg total) by mouth 2 (two) times daily. 07/05/20  Yes [name], Jolene T, NP  amitriptyline  (ELAVIL) 25 MG tablet Take 1 tablet (25 mg total) by mouth at bedtime. 02/21/21  Yes [name], Jolene T, NP  atorvastatin (LIPITOR) 40 MG tablet Take 1 tablet (40 mg total) by mouth at bedtime. 02/21/21  Yes [name], Jolene T, NP  cetirizine (ZYRTEC) 10 MG tablet Take 10 mg by mouth daily.   Yes [provider]  Cholecalciferol (VITAMIN D) 2000 units CAPS Take 2,000 Units by mouth daily.   Yes [provider]  empagliflozin (JARDIANCE) 10 MG TABS tablet Take 1 tablet (10 mg total) by mouth daily before breakfast. 08/23/20  Yes [name], Jolene T, NP  levETIRAcetam (KEPPRA) 500 MG tablet Take 1 tablet (500 mg total) by mouth 2 (two) times daily. 05/09/20  Yes McCue, [name], NP  metFORMIN (GLUCOPHAGE) 1000 MG tablet Take 1 tablet (1,000 mg total) by mouth 2 (two) times daily with a meal. 07/02/20  Yes [name], Jolene T, NP  tamsulosin (FLOMAX) 0.4 MG CAPS capsule Take 1 capsule (0.4 mg total) by mouth at bedtime. 07/02/20  Yes [name], Jolene T, NP  telmisartan (MICARDIS) 80 MG tablet Take 0.5 tablets (40 mg total) by mouth daily. 11/23/20  Yes [name], Henrine Screws T, NP  aspirin EC 81 MG EC tablet Take 1 tablet (81 mg total) by mouth daily. 02/11/19   Debbe Odea, MD  betamethasone dipropionate (DIPROLENE) 0.05 % ointment Apply 1 application topically 2 (two) times daily as needed for rash. 08/09/20   [provider]  clobetasol ointment (TEMOVATE) 0.05 % Apply topically 2 (two) times daily. 02/25/21   [provider]  Cyanocobalamin (B-12) 5000 MCG CAPS Take 5,000 mcg by mouth every other day.    [provider]  glucose blood (ONETOUCH ULTRA) test strip 1 each by Other route 2 (two) times daily. Use as instructed 03/06/21   Marnee Guarneri T, NP  Lancets (ONETOUCH ULTRASOFT) lancets USE 1  TO CHECK GLUCOSE TWICE DAILY 11/24/20   [name], Henrine Screws T, NP  Multiple Vitamins-Minerals (MULTIVITAMIN PO) Take 1 tablet by mouth daily.     [provider]  polyethylene  glycol-electrolytes (NULYTELY) 420 g solution Prepare according to package instructions. Starting at 5:00 PM: Drink one 8 oz glass of mixture every 15 minutes until you finish half of the jug. Five hours prior to procedure, drink 8 oz glass of mixture every 15 minutes until it is all gone. Make sure you do not drink anything 4 hours prior to your procedure. 03/05/21   Jonathon Bellows, MD    Allergies as of 03/04/2021 - Review Complete 03/04/2021  Allergen Reaction Noted   Succinylsulphathiazole Rash 08/09/2018   Sulfamethoxazole-trimethoprim Rash 08/13/2009   Tetracyclines & related Rash 07/11/2012  Family History  Problem Relation Age of Onset   Cancer Mother        pancreatic   Diabetes Mother    Stroke Mother    Heart disease Mother    Hyperlipidemia Mother    Hypertension Mother    Heart attack Mother    Heart disease Father    Stroke Father    Diabetes Father    Hypertension Father    Heart attack Father    Hyperlipidemia Father    Pancreatic cancer Father    Cancer Sister    Cancer Brother        lung   Cancer Brother    Kidney cancer Neg Hx    Bladder Cancer Neg Hx    Prostate cancer Neg Hx     Social History   Socioeconomic History   Marital status: Married    Spouse name: Not on file   Number of children: Not on file   Years of education: Not on file   Highest education level: Some college, no degree  Occupational History    Comment: drives for [employer]   Tobacco Use   Smoking status: Former    Packs/day: 1.00    Years: 10.00    Pack years: 10.00    Types: Cigarettes    Quit date: 07/07/1984    Years since quitting: 36.7   Smokeless tobacco: Never   Tobacco comments:    quit 1986  Vaping Use   Vaping Use: Never used  Substance and Sexual Activity   Alcohol use: No    Alcohol/week: 0.0 standard drinks   Drug use: No   Sexual activity: Yes  Other Topics Concern   Not on file  Social History Narrative   Not on file   Social Determinants of Health    Financial Resource Strain: Low Risk    Difficulty of Paying Living Expenses: Not hard at all  Food Insecurity: No Food Insecurity   Worried About Charity fundraiser in the Last Year: Never true   Ran Out of Food in the Last Year: Never true  Transportation Needs: No Transportation Needs   Lack of Transportation (Medical): No   Lack of Transportation (Non-Medical): No  Physical Activity: Sufficiently Active   Days of Exercise per Week: 5 days   Minutes of Exercise per Session: 40 min  Stress: No Stress Concern Present   Feeling of Stress : Not at all  Social Connections: Not on file  Intimate Partner Violence: Not on file    Review of Systems: See HPI, otherwise negative ROS  Physical Exam: BP (!) 151/77   Pulse (!) 54   Temp (!) 96.3 F (35.7 C) (Temporal)   Resp 18   Ht 5\' 10"  (1.778 m)   Wt 90.7 kg   SpO2 100%   BMI 28.70 kg/m  General:   Alert,  pleasant and cooperative in NAD Head:  Normocephalic and atraumatic. Neck:  Supple; no masses or thyromegaly. Lungs:  Clear throughout to auscultation, normal respiratory effort.    Heart:  +S1, +S2, Regular rate and rhythm, No edema. Abdomen:  Soft, nontender and nondistended. Normal bowel sounds, without guarding, and without rebound.   Neurologic:  Alert and  oriented x4;  grossly normal neurologically.  Impression/Plan: ARTYOM STENCEL is here for an colonoscopy to be performed for Screening colonoscopy average risk   Risks, benefits, limitations, and alternatives regarding  colonoscopy have been reviewed with the patient.  Questions have been answered.  All parties  agreeable.   Jonathon Bellows, MD  03/26/2021, 7:52 AM

## 2021-03-26 NOTE — Anesthesia Preprocedure Evaluation (Signed)
Anesthesia Evaluation  Patient identified by MRN, date of birth, ID band Patient awake    Reviewed: Allergy & Precautions, H&P , NPO status , Patient's Chart, lab work & pertinent test results, reviewed documented beta blocker date and time   History of Anesthesia Complications Negative for: history of anesthetic complications  Airway Mallampati: I  TM Distance: >3 FB Neck ROM: full    Dental  (+) Dental Advidsory Given, Teeth Intact, Missing, Partial Upper, Partial Lower   Pulmonary neg shortness of breath, asthma , sleep apnea and Continuous Positive Airway Pressure Ventilation , neg recent URI, former smoker,    Pulmonary exam normal breath sounds clear to auscultation       Cardiovascular Exercise Tolerance: Good hypertension, (-) angina+ CAD and + Cardiac Stents  (-) Past MI Normal cardiovascular exam(-) dysrhythmias (-) Valvular Problems/Murmurs Rhythm:regular Rate:Normal     Neuro/Psych Seizures -, Well Controlled,  PSYCHIATRIC DISORDERS Depression CVA, No Residual Symptoms    GI/Hepatic Neg liver ROS, GERD  ,  Endo/Other  diabetes  Renal/GU negative Renal ROS  negative genitourinary   Musculoskeletal   Abdominal   Peds  Hematology negative hematology ROS (+)   Anesthesia Other Findings Past Medical History: No date: Arthritis No date: Asthma     Comment:  has not needed since 6/21 No date: Cancer ([place])     Comment:  prostate No date: Chronic venous insufficiency     Comment:  varicose vein lower extremity with inflammation 1996: Coronary artery disease     Comment:  two stents placed  No date: Diabetes mellitus without complication ([facility])     Comment:  type 2 on metformin No date: GERD (gastroesophageal reflux disease)     Comment:  no issues since gastric bypass surgery as stated per pt No date: Hyperlipidemia No date: Hypertension No date: Hypogonadism in male No date: MRSA (methicillin resistant  Staphylococcus aureus) infection     Comment:  07/30/2008 thru 08/07/2008  abdominal abcess No date: Sleep apnea     Comment:  on BIPAP No date: Stented coronary artery 09/07/2019: Stroke [facility])     Comment:  no defecits No date: Thrombocythemia   Reproductive/Obstetrics negative OB ROS                             Anesthesia Physical Anesthesia Plan  ASA: 3  Anesthesia Plan: General   Post-op Pain Management:    Induction: Intravenous  PONV Risk Score and Plan: 2 and TIVA and Propofol infusion  Airway Management Planned: Natural Airway and Nasal Cannula  Additional Equipment:   Intra-op Plan:   Post-operative Plan:   Informed Consent: I have reviewed the patients History and Physical, chart, labs and discussed the procedure including the risks, benefits and alternatives for the proposed anesthesia with the patient or authorized representative who has indicated his/her understanding and acceptance.     Dental Advisory Given  Plan Discussed with: Anesthesiologist, CRNA and Surgeon  Anesthesia Plan Comments:         Anesthesia Quick Evaluation

## 2021-03-26 NOTE — Anesthesia Postprocedure Evaluation (Signed)
Anesthesia Post Note  Patient: Andrew Cobb  Procedure(s) Performed: COLONOSCOPY WITH PROPOFOL  Patient location during evaluation: Endoscopy Anesthesia Type: General Level of consciousness: awake and alert Pain management: pain level controlled Vital Signs Assessment: post-procedure vital signs reviewed and stable Respiratory status: spontaneous breathing, nonlabored ventilation, respiratory function stable and patient connected to nasal cannula oxygen Cardiovascular status: blood pressure returned to baseline and stable Postop Assessment: no apparent nausea or vomiting Anesthetic complications: no   No notable events documented.   Last Vitals:  Vitals:   03/26/21 0731 03/26/21 0833  BP: (!) 151/77 104/71  Pulse: (!) 54 (!) 58  Resp: 18 10  Temp: (!) 35.7 C (!) 36.3 C  SpO2: 100% 99%    Last Pain:  Vitals:   03/26/21 0853  TempSrc:   PainSc: 0-No pain                 Martha Clan

## 2021-03-26 NOTE — Transfer of Care (Signed)
Immediate Anesthesia Transfer of Care Note  Patient: Andrew Cobb  Procedure(s) Performed: COLONOSCOPY WITH PROPOFOL  Patient Location: Endoscopy Unit  Anesthesia Type:General  Level of Consciousness: drowsy  Airway & Oxygen Therapy: Patient Spontanous Breathing  Post-op Assessment: Report given to RN and Post -op Vital signs reviewed and stable  Post vital signs: Reviewed  Last Vitals:  Vitals Value Taken Time  BP 104/71 03/26/21 0834  Temp 36.3 C 03/26/21 0833  Pulse 57 03/26/21 0835  Resp 12 03/26/21 0835  SpO2 98 % 03/26/21 0835  Vitals shown include unvalidated device data.  Last Pain:  Vitals:   03/26/21 0833  TempSrc: Temporal  PainSc: Asleep         Complications: No notable events documented.

## 2021-03-27 ENCOUNTER — Encounter: Payer: Self-pay | Admitting: Gastroenterology

## 2021-03-27 LAB — CUP PACEART REMOTE DEVICE CHECK
Date Time Interrogation Session: 20220916233401
Implantable Pulse Generator Implant Date: 20200316

## 2021-03-27 LAB — SURGICAL PATHOLOGY

## 2021-03-29 DIAGNOSIS — M542 Cervicalgia: Secondary | ICD-10-CM | POA: Diagnosis not present

## 2021-03-29 DIAGNOSIS — M436 Torticollis: Secondary | ICD-10-CM | POA: Diagnosis not present

## 2021-03-29 DIAGNOSIS — M9901 Segmental and somatic dysfunction of cervical region: Secondary | ICD-10-CM | POA: Diagnosis not present

## 2021-03-29 DIAGNOSIS — Z72820 Sleep deprivation: Secondary | ICD-10-CM | POA: Diagnosis not present

## 2021-04-01 NOTE — Progress Notes (Signed)
Carelink Summary Report / Loop Recorder 

## 2021-04-15 DIAGNOSIS — G4733 Obstructive sleep apnea (adult) (pediatric): Secondary | ICD-10-CM | POA: Diagnosis not present

## 2021-04-18 ENCOUNTER — Encounter (INDEPENDENT_AMBULATORY_CARE_PROVIDER_SITE_OTHER): Payer: Self-pay | Admitting: Ophthalmology

## 2021-04-18 ENCOUNTER — Other Ambulatory Visit: Payer: Self-pay

## 2021-04-18 ENCOUNTER — Encounter (INDEPENDENT_AMBULATORY_CARE_PROVIDER_SITE_OTHER): Payer: PPO | Admitting: Ophthalmology

## 2021-04-18 ENCOUNTER — Ambulatory Visit (INDEPENDENT_AMBULATORY_CARE_PROVIDER_SITE_OTHER): Payer: PPO | Admitting: Ophthalmology

## 2021-04-18 DIAGNOSIS — H35721 Serous detachment of retinal pigment epithelium, right eye: Secondary | ICD-10-CM

## 2021-04-18 DIAGNOSIS — H353211 Exudative age-related macular degeneration, right eye, with active choroidal neovascularization: Secondary | ICD-10-CM

## 2021-04-18 DIAGNOSIS — G4733 Obstructive sleep apnea (adult) (pediatric): Secondary | ICD-10-CM

## 2021-04-18 MED ORDER — AFLIBERCEPT 2MG/0.05ML IZ SOLN FOR KALEIDOSCOPE
2.0000 mg | INTRAVITREAL | Status: AC | PRN
  Administered 2021-04-18: 2 mg via INTRAVITREAL

## 2021-04-18 NOTE — Assessment & Plan Note (Signed)
Patient very compliant in using this consistently and, on this treatment of CPAP for the last 28 years

## 2021-04-18 NOTE — Assessment & Plan Note (Signed)
Improved with less subretinal fluid post Eylea injection 9 02-2021.  We will repeat injection OD today and maintain 5-week interval as condition appears to be responsive to [facility]

## 2021-04-18 NOTE — Progress Notes (Signed)
04/18/2021     CHIEF COMPLAINT Patient presents for  Chief Complaint  Patient presents with   Retina Follow Up    6 week fu OD and Avastin OD Pt states VA OU stable since last visit. Pt denies FOL, floaters, or ocular pain OU.  A1C: 6.7 LBS: 113       HISTORY OF PRESENT ILLNESS: Andrew Cobb is a 70 y.o. male who presents to the clinic today for:   HPI     Retina Follow Up   Patient presents with  Wet AMD.  In right eye.  This started 5 weeks ago.  Severity is mild.  Duration of 5 weeks.  Since onset it is stable. Additional comments: 6 week fu OD and Avastin OD Pt states VA OU stable since last visit. Pt denies FOL, floaters, or ocular pain OU.  A1C: 6.7 LBS: 113         Comments   5 week fu OD oct Eylea OD. Patient states vision is stable and unchanged since last visit. Denies any new floaters or FOL. LBS: Pt states "like 115, today." A1C: 6.7      Last edited by Laurin Coder on 04/18/2021  9:02 AM.      Referring physician: [name], NP [place],  [place] [address]  HISTORICAL INFORMATION:   Selected notes from the MEDICAL RECORD NUMBER    Lab Results  Component Value Date   HGBA1C 6.8 02/21/2021     CURRENT MEDICATIONS: No current outpatient medications on file. (Ophthalmic Drugs)   No current facility-administered medications for this visit. (Ophthalmic Drugs)   Current Outpatient Medications (Other)  Medication Sig   acyclovir (ZOVIRAX) 400 MG tablet Take 1 tablet (400 mg total) by mouth 2 (two) times daily.   amitriptyline (ELAVIL) 25 MG tablet Take 1 tablet (25 mg total) by mouth at bedtime.   aspirin EC 81 MG EC tablet Take 1 tablet (81 mg total) by mouth daily.   atorvastatin (LIPITOR) 40 MG tablet Take 1 tablet (40 mg total) by mouth at bedtime.   betamethasone dipropionate (DIPROLENE) 0.05 % ointment Apply 1 application topically 2 (two) times daily as needed for rash.   cetirizine (ZYRTEC) 10 MG tablet Take  10 mg by mouth daily.   Cholecalciferol (VITAMIN D) 2000 units CAPS Take 2,000 Units by mouth daily.   clobetasol ointment (TEMOVATE) 0.05 % Apply topically 2 (two) times daily.   Cyanocobalamin (B-12) 5000 MCG CAPS Take 5,000 mcg by mouth every other day.   empagliflozin (JARDIANCE) 10 MG TABS tablet Take 1 tablet (10 mg total) by mouth daily before breakfast.   glucose blood (ONETOUCH ULTRA) test strip 1 each by Other route 2 (two) times daily. Use as instructed   Lancets (ONETOUCH ULTRASOFT) lancets USE 1  TO CHECK GLUCOSE TWICE DAILY   levETIRAcetam (KEPPRA) 500 MG tablet Take 1 tablet (500 mg total) by mouth 2 (two) times daily.   metFORMIN (GLUCOPHAGE) 1000 MG tablet Take 1 tablet (1,000 mg total) by mouth 2 (two) times daily with a meal.   Multiple Vitamins-Minerals (MULTIVITAMIN PO) Take 1 tablet by mouth daily.    polyethylene glycol-electrolytes (NULYTELY) 420 g solution Prepare according to package instructions. Starting at 5:00 PM: Drink one 8 oz glass of mixture every 15 minutes until you finish half of the jug. Five hours prior to procedure, drink 8 oz glass of mixture every 15 minutes until it is all gone. Make sure you do  not drink anything 4 hours prior to your procedure.   tamsulosin (FLOMAX) 0.4 MG CAPS capsule Take 1 capsule (0.4 mg total) by mouth at bedtime.   telmisartan (MICARDIS) 80 MG tablet Take 0.5 tablets (40 mg total) by mouth daily.   No current facility-administered medications for this visit. (Other)      REVIEW OF SYSTEMS:    ALLERGIES Allergies  Allergen Reactions   Succinylsulphathiazole Rash   Sulfamethoxazole-Trimethoprim Rash   Tetracyclines & Related Rash    PAST MEDICAL HISTORY Past Medical History:  Diagnosis Date   Arthritis    Asthma    has not needed since 6/21   Cancer [facility])    prostate   Chronic venous insufficiency    varicose vein lower extremity with inflammation   Coronary artery disease 1996   two stents placed    Diabetes  mellitus without complication ([place])    type 2 on metformin   GERD (gastroesophageal reflux disease)    no issues since gastric bypass surgery as stated per pt   Hyperlipidemia    Hypertension    Hypogonadism in male    MRSA (methicillin resistant Staphylococcus aureus) infection    07/30/2008 thru 08/07/2008  abdominal abcess   Sleep apnea    on BIPAP   Stented coronary artery    Stroke ([place]) 09/07/2019   no defecits   Thrombocythemia    Past Surgical History:  Procedure Laterality Date   ANGIOPLASTY     ANGIOPLASTY     with stent 04/07/1995   ANTERIOR CERVICAL DECOMPRESSION/DISCECTOMY FUSION 4 LEVELS N/A 08/17/2017   Procedure: Anterior discectomy with fusion and plate fixation Cervical Three-Four, Four-Five, Five-Six, and Six-Seven Fusion;  Surgeon: Ditty, Kevan Ny, MD;  Location: [facility];  Service: Neurosurgery;  Laterality: N/A;  Anterior discectomy with fusion and plate fixation Cervical Three-Four, Four-Five, Five-Six, and Six-Seven Fusion    APPENDECTOMY     1966   BUBBLE STUDY  09/20/2018   Procedure: BUBBLE STUDY;  Surgeon: Josue Hector, MD;  Location: [facility] ENDOSCOPY;  Service: Cardiovascular;;   [place] TEST     07/31/2011   CARPAL TUNNEL RELEASE Left    CHOLECYSTECTOMY     2006   COLONOSCOPY WITH PROPOFOL N/A 03/26/2021   Procedure: COLONOSCOPY WITH PROPOFOL;  Surgeon: Jonathon Bellows, MD;  Location[facility]COPY;  Service: Gastroenterology;  Laterality: N/A;   colonscopy      08/25/2012   EYE SURGERY Bilateral    cataract   FUNCTIONAL ENDOSCOPIC SINUS SURGERY     11/10/2013   GASTRIC BYPASS     10/05/2012   HERNIA REPAIR     left inguinal 1981   INCISION AND DRAINAGE ABSCESS Right 02/26/2017   Procedure: INCISION AND DRAINAGE ABSCESS;  Surgeon: Nickie Retort, MD;  Location: [facility] ORS;  Service: Urology;  Laterality: Right;   JOINT REPLACEMENT     bilateral   left ankle surgery      05/03/2003    left carpel  tunnel      09/18/1993   left knee meniscal tear      01/25/2010   left knee meniscal tear repair      05/04/1996   left rotator cuff repair      05/03/2003    LOOP RECORDER INSERTION N/A 09/20/2018   Procedure: LOOP RECORDER INSERTION;  Surgeon: Evans Lance, MD;  Location: [facility] CV LAB;  Service: Cardiovascular;  Laterality: N/A;   REPLACEMENT TOTAL KNEE BILATERAL  07/13/2015   REVERSE SHOULDER ARTHROPLASTY Left 08/30/2020   Procedure: REVERSE SHOULDER ARTHROPLASTY;  Surgeon: Justice Britain, MD;  Location: [facility] ORS;  Service: Orthopedics;  Laterality: Left;  119min   right ankle surgery      fracture has 2 screws 07/07/1997   right carpel tunnel      05/16/1992   right shoulder replacement      01/27/2006   SCROTAL EXPLORATION Right 02/26/2017   Procedure: SCROTUM EXPLORATION;  Surgeon: Nickie Retort, MD;  Location: [facility] ORS;  Service: Urology;  Laterality: Right;   TEE WITHOUT CARDIOVERSION N/A 09/20/2018   Procedure: TRANSESOPHAGEAL ECHOCARDIOGRAM (TEE);  Surgeon: Josue Hector, MD;  Location[facility] ENDOSCOPY;  Service: Cardiovascular;  Laterality: N/A;   TOTAL KNEE ARTHROPLASTY Left 07/13/2015   Procedure: LEFT TOTAL KNEE ARTHROPLASTY;  Surgeon: Gaynelle Arabian, MD;  Location: [facility] ORS;  Service: Orthopedics;  Laterality: Left;    FAMILY HISTORY Family History  Problem Relation Age of Onset   Cancer Mother        pancreatic   Diabetes Mother    Stroke Mother    Heart disease Mother    Hyperlipidemia Mother    Hypertension Mother    Heart attack Mother    Heart disease Father    Stroke Father    Diabetes Father    Hypertension Father    Heart attack Father    Hyperlipidemia Father    Pancreatic cancer Father    Cancer Sister    Cancer Brother        lung   Cancer Brother    Kidney cancer Neg Hx    Bladder Cancer Neg Hx    Prostate cancer Neg Hx     SOCIAL HISTORY Social History   Tobacco Use   Smoking status: Former    Packs/day: 1.00    Years: 10.00     Pack years: 10.00    Types: Cigarettes    Quit date: 07/07/1984    Years since quitting: 36.8   Smokeless tobacco: Never   Tobacco comments:    quit 1986  Vaping Use   Vaping Use: Never used  Substance Use Topics   Alcohol use: No    Alcohol/week: 0.0 standard drinks   Drug use: No         OPHTHALMIC EXAM:  Base Eye Exam     Visual Acuity (ETDRS)       Right Left   [place] 20/30 +1 20/25 -2+2         Tonometry (Tonopen, 9:05 AM)       Right Left   Pressure 15 11         Pupils       Pupils Dark Light Shape React APD   Right PERRL 4 3 Round Brisk None   Left PERRL 4 3 Round Brisk None         Extraocular Movement       Right Left    Full Full         Neuro/Psych     Oriented x3: Yes   Mood/Affect: Normal         Dilation     Right eye: 1.0% Mydriacyl, 2.5% Phenylephrine @ 9:05 AM           Slit Lamp and Fundus Exam     External Exam       Right Left   External Normal Normal         Slit Lamp Exam  Right Left   Lids/Lashes Normal Normal   Conjunctiva/Sclera White and quiet White and quiet   Cornea Clear Clear   Anterior Chamber Deep and quiet Deep and quiet   Iris Round and reactive Round and reactive   Lens Posterior chamber intraocular lens Posterior chamber intraocular lens   Anterior Vitreous Normal Normal         Fundus Exam       Right Left   Posterior Vitreous Posterior vitreous detachment    Disc Normal    C/D Ratio 0.6    Macula Hard drusen, no exudates, Macular thickening, Soft drusen, Pigmented atrophy, Retinal pigment epithelial mottling, Retinal pigment epithelial detachment    Vessels Normal, , no DR    Periphery Normal             IMAGING AND PROCEDURES  Imaging and Procedures for 04/18/21  OCT, Retina - OU - Both Eyes       Right Eye Quality was good. Scan locations included subfoveal. Central Foveal Thickness: 332. Progression has been stable. Findings include abnormal foveal  contour, subretinal fluid, pigment epithelial detachment, no IRF.   Left Eye Quality was good. Scan locations included subfoveal. Central Foveal Thickness: 291. Progression has been stable. Findings include retinal drusen , abnormal foveal contour, no SRF, no IRF.   Notes Today OD at 5 week interval, small change with decreased serous retinal detachment adjacent to the vascularized pigment epithelial detachment nasal to the FAZ.  We will repeat injection today Eylea today next and examination again in 5 weeks, as less srf     Intravitreal Injection, Pharmacologic Agent - OD - Right Eye       Time Out 04/18/2021. 9:32 AM. Confirmed correct patient, procedure, site, and patient consented.   Anesthesia Topical anesthesia was used. Anesthetic medications included Akten 3.5%.   Procedure Preparation included Tobramycin 0.3%, 10% betadine to eyelids, 5% betadine to ocular surface. A 30 gauge needle was used.   Injection: 2 mg aflibercept 2 MG/0.05ML   Route: Intravitreal, Site: Right Eye   NDC: [id number], Lot: [id number], Waste: 0 mL   Post-op Post injection exam found visual acuity of at least counting fingers. The patient tolerated the procedure well. There were no complications. The patient received written and verbal post procedure care education. Post injection medications included ocuflox.              ASSESSMENT/PLAN:  OSA (obstructive sleep apnea) Patient very compliant in using this consistently and, on this treatment of CPAP for the last 28 years  Serous detachment of retinal pigment epithelium of right eye Improved OD, less SRF with treatment at 5-week interval  Exudative age-related macular degeneration of right eye with active choroidal neovascularization ([place]) Improved with less subretinal fluid post Eylea injection 9 02-2021.  We will repeat injection OD today and maintain 5-week interval as condition appears to be responsive to [employer]     ICD-10-CM   1.  Exudative age-related macular degeneration of right eye with active choroidal neovascularization (HCC)  H35.3211 OCT, Retina - OU - Both Eyes    Intravitreal Injection, Pharmacologic Agent - OD - Right Eye    aflibercept (EYLEA) SOLN 2 mg    2. OSA (obstructive sleep apnea)  G47.33     3. Serous detachment of retinal pigment epithelium of right eye  H35.721       1.  OD, improving serous component of subretinal CNVM.  Post change of medication from Avastin to [facility].  Repeat injection Eylea  today at 5-week interval examination next in 5-week 2.  3.  Ophthalmic Meds Ordered this visit:  Meds ordered this encounter  Medications   aflibercept (EYLEA) SOLN 2 mg       Return in about 5 weeks (around 05/23/2021) for dilate, OD, EYLEA OCT.  There are no Patient Instructions on file for this visit.   Explained the diagnoses, plan, and follow up with the patient and they expressed understanding.  Patient expressed understanding of the importance of proper follow up care.   [name] Yaris Ferrell M.D. Diseases & Surgery of the Retina and Vitreous [facility] 04/18/21     Abbreviations: M myopia (nearsighted); A astigmatism; H hyperopia (farsighted); P presbyopia; Mrx spectacle prescription;  CTL contact lenses; OD right eye; OS left eye; OU both eyes  XT exotropia; ET esotropia; PEK punctate epithelial keratitis; PEE punctate epithelial erosions; DES dry eye syndrome; MGD meibomian gland dysfunction; ATs artificial tears; PFAT's preservative free artificial tears; [place] nuclear sclerotic cataract; PSC posterior subcapsular cataract; ERM epi-retinal membrane; PVD posterior vitreous detachment; RD retinal detachment; DM diabetes mellitus; DR diabetic retinopathy; NPDR non-proliferative diabetic retinopathy; PDR proliferative diabetic retinopathy; CSME clinically significant macular edema; DME diabetic macular edema; dbh dot blot hemorrhages; CWS cotton wool spot; POAG primary open  angle glaucoma; C/D cup-to-disc ratio; HVF humphrey visual field; GVF goldmann visual field; OCT optical coherence tomography; IOP intraocular pressure; BRVO Branch retinal vein occlusion; CRVO central retinal vein occlusion; CRAO central retinal artery occlusion; BRAO branch retinal artery occlusion; RT retinal tear; SB scleral buckle; PPV pars plana vitrectomy; VH Vitreous hemorrhage; PRP panretinal laser photocoagulation; IVK intravitreal kenalog; VMT vitreomacular traction; MH Macular hole;  NVD neovascularization of the disc; NVE neovascularization elsewhere; AREDS age related eye disease study; ARMD age related macular degeneration; POAG primary open angle glaucoma; EBMD epithelial/anterior basement membrane dystrophy; ACIOL anterior chamber intraocular lens; IOL intraocular lens; PCIOL posterior chamber intraocular lens; Phaco/IOL phacoemulsification with intraocular lens placement; [place] photorefractive keratectomy; LASIK laser assisted in situ keratomileusis; HTN hypertension; DM diabetes mellitus; COPD chronic obstructive pulmonary disease

## 2021-04-18 NOTE — Assessment & Plan Note (Signed)
Improved OD, less SRF with treatment at 5-week interval

## 2021-04-24 ENCOUNTER — Telehealth: Payer: Self-pay

## 2021-04-24 NOTE — Chronic Care Management (AMB) (Signed)
Chronic Care Management Pharmacy Assistant   Name: Andrew Cobb  MRN: [id number] DOB: 25-Jun-1951   Reason for Encounter: Disease State General Assessment     Recent office visits:  02/21/21 [name] NP - Seen for diabetes - Labs ordered - No medication changes noted - Follow up in 4 months  12/31/20 [name] NP - Seen for obstructive sleep apnea - No follow up noted  11/23/20 [name] NP - Seen for diabetes - Labs ordered - Follow up in 3 months   Recent consult visits:  04/18/21 Ophthalmology - Deloria Lair MD - Seen for Exudative age-related macular degeneration of right eye - aflibercept (EYLEA) SOLN 2 mg injection given in office - Follow up in 5 weeks  03/26/21 colonoscopy - No medciation changes noted - No follow up noted  03/14/21 Ophthalmology - Deloria Lair MD - Seen for Exudative age-related macular degeneration of right eye - aflibercept (EYLEA) SOLN 2 mg injection given in office - Follow up in 5 weeks  02/25/21 Dermatology - Kirkland Hun MD - Seen for Lichen simplex chronicus - No notes available  01/31/21 Ophthalmology - Deloria Lair MD - Seen for Exudative age-related macular degeneration of right eye - bevacizumab (AVASTIN) SOSY 2.5 mg given in office - No medication changes noted - Follow up in 6 weeks  01/14/21 Cardiology - Jonna Munro for Cerebral infarction - No notes available  12/26/20 [name] MD - Neurology - Seen for OSA on CPAP - No medication changes noted - Follow up in 1 year  12/20/20 Deloria Lair MD - Seen for Exudative age-related macular degeneration of right eye - bevacizumab (AVASTIN) SOSY 2.5 mg injection given in office - Follow up in 6 weeks  12/10/20 Cardiology - Cristopher Peru - Seen for Syncope and collapse - No notes available  11/21/20 Orthopedic surgery - Justice Britain - Seen for Aftercare following joint replacement surgery - No notes available  11/16/20 [place] for  syncope - No medication changes noted - Follow up in 6 months    Hospital visits:   Admitted to the hospital on 11/12/20 due to Postural dizziness with presyncope. Discharge date was 11/12/20. Discharged from [facility]?Medications Started at [facility] Discharge:?? N/a  Medication Changes at Hospital Discharge: N/a  Medications Discontinued at Hospital Discharge: N/a  Medications that remain the same after Hospital Discharge:??  -All other medications will remain the same.    Medications: Outpatient Encounter Medications as of 04/24/2021  Medication Sig   acyclovir (ZOVIRAX) 400 MG tablet Take 1 tablet (400 mg total) by mouth 2 (two) times daily.   amitriptyline (ELAVIL) 25 MG tablet Take 1 tablet (25 mg total) by mouth at bedtime.   aspirin EC 81 MG EC tablet Take 1 tablet (81 mg total) by mouth daily.   atorvastatin (LIPITOR) 40 MG tablet Take 1 tablet (40 mg total) by mouth at bedtime.   betamethasone dipropionate (DIPROLENE) 0.05 % ointment Apply 1 application topically 2 (two) times daily as needed for rash.   cetirizine (ZYRTEC) 10 MG tablet Take 10 mg by mouth daily.   Cholecalciferol (VITAMIN D) 2000 units CAPS Take 2,000 Units by mouth daily.   clobetasol ointment (TEMOVATE) 0.05 % Apply topically 2 (two) times daily.   Cyanocobalamin (B-12) 5000 MCG CAPS Take 5,000 mcg by mouth every other day.   empagliflozin (JARDIANCE) 10 MG TABS tablet  Take 1 tablet (10 mg total) by mouth daily before breakfast.   glucose blood (ONETOUCH ULTRA) test strip 1 each by Other route 2 (two) times daily. Use as instructed   Lancets (ONETOUCH ULTRASOFT) lancets USE 1  TO CHECK GLUCOSE TWICE DAILY   levETIRAcetam (KEPPRA) 500 MG tablet Take 1 tablet (500 mg total) by mouth 2 (two) times daily.   metFORMIN (GLUCOPHAGE) 1000 MG tablet Take 1 tablet (1,000 mg total) by mouth 2 (two) times daily with a meal.   Multiple Vitamins-Minerals (MULTIVITAMIN PO)  Take 1 tablet by mouth daily.    polyethylene glycol-electrolytes (NULYTELY) 420 g solution Prepare according to package instructions. Starting at 5:00 PM: Drink one 8 oz glass of mixture every 15 minutes until you finish half of the jug. Five hours prior to procedure, drink 8 oz glass of mixture every 15 minutes until it is all gone. Make sure you do not drink anything 4 hours prior to your procedure.   tamsulosin (FLOMAX) 0.4 MG CAPS capsule Take 1 capsule (0.4 mg total) by mouth at bedtime.   telmisartan (MICARDIS) 80 MG tablet Take 0.5 tablets (40 mg total) by mouth daily.   No facility-administered encounter medications on file as of 04/24/2021.   Have you had any problems recently with your health? Patient states that he is doing okay with his health, no concerns at the moment.    Have you had any problems with your pharmacy? Patient states no issues with pharmacy.    What issues or side effects are you having with your medications? Patient states that he has no issues with his medications at the moment.    What would you like me to pass along to Edison Nasuti Potts,CPP for them to help you with?  Patient states that he is having trouble affording his aflibercept (EYLEA) SOLN 2 mg injections. He used to pay $14 and he is now paying $328. He isn't sure what to do or if there is any assistance available for it   What can we do to take care of you better? N/a  Misc comment: Spoke with patient, A very pleasant man. He states that he is doing okay overall. He has no concerns with his medications other than the cost for his eye injections. 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 He is scheduled for an appointment with Edison Nasuti on 06/10/21 @ 11 am.   Care Gaps: None   Star Rating Drugs: atorvastatin (LIPITOR) 40 MG tablet 04/15/2021 90 DS  empagliflozin (JARDIANCE) 10 MG TABS tablet 08/23/20 90 DS metFORMIN (GLUCOPHAGE) 1000 MG tablet 01/18/2021 9DS telmisartan (MICARDIS) 80 MG tablet  01/30/2021 90 DS    Andee Poles, CMA

## 2021-04-29 ENCOUNTER — Ambulatory Visit (INDEPENDENT_AMBULATORY_CARE_PROVIDER_SITE_OTHER): Payer: PPO

## 2021-04-29 DIAGNOSIS — I639 Cerebral infarction, unspecified: Secondary | ICD-10-CM | POA: Diagnosis not present

## 2021-04-29 LAB — CUP PACEART REMOTE DEVICE CHECK
Date Time Interrogation Session: 20221019233821
Implantable Pulse Generator Implant Date: 20200316

## 2021-05-01 ENCOUNTER — Telehealth: Payer: Self-pay

## 2021-05-01 NOTE — Telephone Encounter (Signed)
[facility] alert received for AF episodes, longest episode >2 hours.  Per report there were 8 episodes, but details only provided for 1 episode.  Attempted to reach pt to have him send a manual transmission, no answer.  LVM with device clinic # and hours to return call.

## 2021-05-06 ENCOUNTER — Encounter: Payer: Self-pay | Admitting: Nurse Practitioner

## 2021-05-06 ENCOUNTER — Other Ambulatory Visit: Payer: Self-pay

## 2021-05-06 ENCOUNTER — Ambulatory Visit (INDEPENDENT_AMBULATORY_CARE_PROVIDER_SITE_OTHER): Payer: PPO | Admitting: Nurse Practitioner

## 2021-05-06 VITALS — BP 115/70 | HR 73 | Temp 97.6°F | Ht 68.5 in | Wt 204.4 lb

## 2021-05-06 DIAGNOSIS — I2510 Atherosclerotic heart disease of native coronary artery without angina pectoris: Secondary | ICD-10-CM

## 2021-05-06 DIAGNOSIS — Z0000 Encounter for general adult medical examination without abnormal findings: Secondary | ICD-10-CM

## 2021-05-06 DIAGNOSIS — Z8673 Personal history of transient ischemic attack (TIA), and cerebral infarction without residual deficits: Secondary | ICD-10-CM | POA: Diagnosis not present

## 2021-05-06 DIAGNOSIS — E785 Hyperlipidemia, unspecified: Secondary | ICD-10-CM | POA: Diagnosis not present

## 2021-05-06 DIAGNOSIS — Z8546 Personal history of malignant neoplasm of prostate: Secondary | ICD-10-CM | POA: Diagnosis not present

## 2021-05-06 DIAGNOSIS — E1169 Type 2 diabetes mellitus with other specified complication: Secondary | ICD-10-CM

## 2021-05-06 DIAGNOSIS — R569 Unspecified convulsions: Secondary | ICD-10-CM | POA: Diagnosis not present

## 2021-05-06 DIAGNOSIS — E1165 Type 2 diabetes mellitus with hyperglycemia: Secondary | ICD-10-CM

## 2021-05-06 DIAGNOSIS — E1159 Type 2 diabetes mellitus with other circulatory complications: Secondary | ICD-10-CM | POA: Diagnosis not present

## 2021-05-06 DIAGNOSIS — D696 Thrombocytopenia, unspecified: Secondary | ICD-10-CM

## 2021-05-06 DIAGNOSIS — D649 Anemia, unspecified: Secondary | ICD-10-CM

## 2021-05-06 DIAGNOSIS — J4520 Mild intermittent asthma, uncomplicated: Secondary | ICD-10-CM | POA: Diagnosis not present

## 2021-05-06 DIAGNOSIS — E559 Vitamin D deficiency, unspecified: Secondary | ICD-10-CM

## 2021-05-06 DIAGNOSIS — E538 Deficiency of other specified B group vitamins: Secondary | ICD-10-CM

## 2021-05-06 DIAGNOSIS — H353211 Exudative age-related macular degeneration, right eye, with active choroidal neovascularization: Secondary | ICD-10-CM | POA: Diagnosis not present

## 2021-05-06 DIAGNOSIS — G4733 Obstructive sleep apnea (adult) (pediatric): Secondary | ICD-10-CM

## 2021-05-06 DIAGNOSIS — I152 Hypertension secondary to endocrine disorders: Secondary | ICD-10-CM

## 2021-05-06 DIAGNOSIS — I700 Atherosclerosis of aorta: Secondary | ICD-10-CM | POA: Diagnosis not present

## 2021-05-06 DIAGNOSIS — R051 Acute cough: Secondary | ICD-10-CM | POA: Insufficient documentation

## 2021-05-06 LAB — MICROALBUMIN, URINE WAIVED
Creatinine, Urine Waived: 50 mg/dL (ref 10–300)
Microalb, Ur Waived: 10 mg/L (ref 0–19)

## 2021-05-06 LAB — BAYER DCA HB A1C WAIVED: HB A1C (BAYER DCA - WAIVED): 6.9 % — ABNORMAL HIGH (ref 4.8–5.6)

## 2021-05-06 LAB — VERITOR FLU A/B WAIVED
Influenza A: NEGATIVE
Influenza B: NEGATIVE

## 2021-05-06 MED ORDER — AMOXICILLIN-POT CLAVULANATE 875-125 MG PO TABS: 1.0000 | ORAL_TABLET | Freq: Two times a day (BID) | ORAL | 0 refills | Status: AC

## 2021-05-06 MED ORDER — METFORMIN HCL 1000 MG PO TABS: 1000.0000 mg | ORAL_TABLET | Freq: Two times a day (BID) | ORAL | 4 refills | Status: DC

## 2021-05-06 MED ORDER — TAMSULOSIN HCL 0.4 MG PO CAPS: 0.4000 mg | ORAL_CAPSULE | Freq: Every day | ORAL | 4 refills | Status: DC

## 2021-05-06 MED ORDER — LIDOCAINE VISCOUS HCL 2 % MT SOLN
15.0000 mL | OROMUCOSAL | 0 refills | Status: DC | PRN
Start: 2021-05-06 — End: 2021-05-16

## 2021-05-06 NOTE — Assessment & Plan Note (Signed)
Chronic, ongoing, followed by opthalmology.  Continue this collaboration. 

## 2021-05-06 NOTE — Assessment & Plan Note (Signed)
Noted on past labs, check CBC today. 

## 2021-05-06 NOTE — Assessment & Plan Note (Addendum)
Continue 100% adherence to CPAP use.  He is awaiting new supplies.

## 2021-05-06 NOTE — Assessment & Plan Note (Signed)
Currently no treatment, continue collaboration with oncology and urology.  Recent notes reviewed. 

## 2021-05-06 NOTE — Assessment & Plan Note (Signed)
Continue collaboration with neurology due to significant stroke history.  Continue current medication regimen. 

## 2021-05-06 NOTE — Telephone Encounter (Signed)
Repeat alert received, pt still needs to send manual transmission as there are missing episodes.    Some new details have come through on alerts.    Second attempt to reach patient to assess symptoms and request manual transmission.    No answer.  LVM with device clinic # and hours.  My chart message sent with [facility] monitor instructions.    Previous episodes of AF in 2020 were false due to noise.  What has come through for these new episodes is not consistent with noise.      Longest episode

## 2021-05-06 NOTE — Assessment & Plan Note (Signed)
Being followed by neurology.  Continue current Keppra dose as prescribed by them and collaboration with neurology, reviewed notes.  Level next visit.

## 2021-05-06 NOTE — Assessment & Plan Note (Signed)
Recent colonoscopy reassuring.  Recent labs showed improvement, recheck CBC today.

## 2021-05-06 NOTE — Assessment & Plan Note (Signed)
Continue ASA and maintaining tight lipid control, may need to increase Atorvastatin to 80 MG.  Lipid panel today.

## 2021-05-06 NOTE — Patient Instructions (Signed)
Diabetes Mellitus and Nutrition, Adult When you have diabetes, or diabetes mellitus, it is very important to have healthy eating habits because your blood sugar (glucose) levels are greatly affected by what you eat and drink. Eating healthy foods in the right amounts, at about the same times every day, can help you:  Control your blood glucose.  Lower your risk of heart disease.  Improve your blood pressure.  Reach or maintain a healthy weight. What can affect my meal plan? Every person with diabetes is different, and each person has different needs for a meal plan. Your health care provider may recommend that you work with a dietitian to make a meal plan that is best for you. Your meal plan may vary depending on factors such as:  The calories you need.  The medicines you take.  Your weight.  Your blood glucose, blood pressure, and cholesterol levels.  Your activity level.  Other health conditions you have, such as heart or kidney disease. How do carbohydrates affect me? Carbohydrates, also called carbs, affect your blood glucose level more than any other type of food. Eating carbs naturally raises the amount of glucose in your blood. Carb counting is a method for keeping track of how many carbs you eat. Counting carbs is important to keep your blood glucose at a healthy level, especially if you use insulin or take certain oral diabetes medicines. It is important to know how many carbs you can safely have in each meal. This is different for every person. Your dietitian can help you calculate how many carbs you should have at each meal and for each snack. How does alcohol affect me? Alcohol can cause a sudden decrease in blood glucose (hypoglycemia), especially if you use insulin or take certain oral diabetes medicines. Hypoglycemia can be a life-threatening condition. Symptoms of hypoglycemia, such as sleepiness, dizziness, and confusion, are similar to symptoms of having too much  alcohol.  Do not drink alcohol if: ? Your health care provider tells you not to drink. ? You are pregnant, may be pregnant, or are planning to become pregnant.  If you drink alcohol: ? Do not drink on an empty stomach. ? Limit how much you use to:  0-1 drink a day for women.  0-2 drinks a day for men. ? Be aware of how much alcohol is in your drink. In the [place] one drink equals one 12 oz bottle of beer (355 mL), one 5 oz glass of wine (148 mL), or one 1 oz glass of hard liquor (44 mL). ? Keep yourself hydrated with water, diet soda, or unsweetened iced tea.  Keep in mind that regular soda, juice, and other mixers may contain a lot of sugar and must be counted as carbs. What are tips for following this plan? Reading food labels  Start by checking the serving size on the "Nutrition Facts" label of packaged foods and drinks. The amount of calories, carbs, fats, and other nutrients listed on the label is based on one serving of the item. Many items contain more than one serving per package.  Check the total grams (g) of carbs in one serving. You can calculate the number of servings of carbs in one serving by dividing the total carbs by 15. For example, if a food has 30 g of total carbs per serving, it would be equal to 2 servings of carbs.  Check the number of grams (g) of saturated fats and trans fats in one serving. Choose foods that have   a low amount or none of these fats.  Check the number of milligrams (mg) of salt (sodium) in one serving. Most people should limit total sodium intake to less than 2,300 mg per day.  Always check the nutrition information of foods labeled as "low-fat" or "nonfat." These foods may be higher in added sugar or refined carbs and should be avoided.  Talk to your dietitian to identify your daily goals for nutrients listed on the label. Shopping  Avoid buying canned, pre-made, or processed foods. These foods tend to be high in fat, sodium, and added  sugar.  Shop around the outside edge of the grocery store. This is where you will most often find fresh fruits and vegetables, bulk grains, fresh meats, and fresh dairy. Cooking  Use low-heat cooking methods, such as baking, instead of high-heat cooking methods like deep frying.  Cook using healthy oils, such as olive, canola, or sunflower oil.  Avoid cooking with butter, cream, or high-fat meats. Meal planning  Eat meals and snacks regularly, preferably at the same times every day. Avoid going long periods of time without eating.  Eat foods that are high in fiber, such as fresh fruits, vegetables, beans, and whole grains. Talk with your dietitian about how many servings of carbs you can eat at each meal.  Eat 4-6 oz (112-168 g) of lean protein each day, such as lean meat, chicken, fish, eggs, or tofu. One ounce (oz) of lean protein is equal to: ? 1 oz (28 g) of meat, chicken, or fish. ? 1 egg. ?  cup (62 g) of tofu.  Eat some foods each day that contain healthy fats, such as avocado, nuts, seeds, and fish.   What foods should I eat? Fruits Berries. Apples. Oranges. Peaches. Apricots. Plums. Grapes. Mango. Papaya. Pomegranate. Kiwi. Cherries. Vegetables Lettuce. Spinach. Leafy greens, including kale, chard, collard greens, and mustard greens. Beets. Cauliflower. Cabbage. Broccoli. Carrots. Green beans. Tomatoes. Peppers. Onions. Cucumbers. Brussels sprouts. Grains Whole grains, such as whole-wheat or whole-grain bread, crackers, tortillas, cereal, and pasta. Unsweetened oatmeal. Quinoa. Brown or wild rice. Meats and other proteins Seafood. Poultry without skin. Lean cuts of poultry and beef. Tofu. Nuts. Seeds. Dairy Low-fat or fat-free dairy products such as milk, yogurt, and cheese. The items listed above may not be a complete list of foods and beverages you can eat. Contact a dietitian for more information. What foods should I avoid? Fruits Fruits canned with  syrup. Vegetables Canned vegetables. Frozen vegetables with butter or cream sauce. Grains Refined white flour and flour products such as bread, pasta, snack foods, and cereals. Avoid all processed foods. Meats and other proteins Fatty cuts of meat. Poultry with skin. Breaded or fried meats. Processed meat. Avoid saturated fats. Dairy Full-fat yogurt, cheese, or milk. Beverages Sweetened drinks, such as soda or iced tea. The items listed above may not be a complete list of foods and beverages you should avoid. Contact a dietitian for more information. Questions to ask a health care provider  Do I need to meet with a diabetes educator?  Do I need to meet with a dietitian?  What number can I call if I have questions?  When are the best times to check my blood glucose? Where to find more information:  American Diabetes Association: diabetes.org  Academy of Nutrition and Dietetics: www.eatright.org  National Institute of Diabetes and Digestive and Kidney Diseases: www.niddk.nih.gov  Association of Diabetes Care and Education Specialists: www.diabeteseducator.org Summary  It is important to have healthy eating   habits because your blood sugar (glucose) levels are greatly affected by what you eat and drink.  A healthy meal plan will help you control your blood glucose and maintain a healthy lifestyle.  Your health care provider may recommend that you work with a dietitian to make a meal plan that is best for you.  Keep in mind that carbohydrates (carbs) and alcohol have immediate effects on your blood glucose levels. It is important to count carbs and to use alcohol carefully. This information is not intended to replace advice given to you by your health care provider. Make sure you discuss any questions you have with your health care provider. Document Revised: 05/31/2019 Document Reviewed: 05/31/2019 Elsevier Patient Education  2021 Elsevier Inc.  

## 2021-05-06 NOTE — Assessment & Plan Note (Signed)
Chronic, ongoing with BP below neuro goal <130/90.  Continue current medication regimen and adjust as needed.  Recommend he monitor BP at home daily and document.  Continue focus on DASH diet and regular exercise at home.  CBC, TSH, and CMP today.  Recommend he wear compression hose at home to avoid syncopal episodes and orthostatic BP.  Return in 3 months.

## 2021-05-06 NOTE — Assessment & Plan Note (Signed)
Chronic, ongoing.  Continue ASA and maintaining tight lipid control, may need to increase Atorvastatin to 80 MG if ever elevation, recent LDL in 40's.  Continue current Atorvastatin dosing.  Lipid check today.  Refills sent in as needed.

## 2021-05-06 NOTE — Assessment & Plan Note (Signed)
Chronic, improved on recent labs.  Continue daily supplement and adjust as needed. Recheck today. 

## 2021-05-06 NOTE — Assessment & Plan Note (Signed)
Chronic, ongoing with A1C in today 6.9% (mild upward trend), plus urine ALB 30 and A:C <30.  Continue Telmisartan for kidney protection. Neuro goal <6.5%, recommend he take medication daily and focus heavily on diet.  Continue  Metformin 1000 MG BID and continue Jardiance 10 MG - obtains via assistance.  May need to increase next visit if elevation above goal ongoing.  Recommend he check BS twice daily + continue focus on regular exercise and diet.  Return in 3 months for A1C check.

## 2021-05-06 NOTE — Assessment & Plan Note (Signed)
Noted on imaging 07/11/20 and previous imaging.  Recommend continue daily statin therapy and ASA for prevention. 

## 2021-05-06 NOTE — Assessment & Plan Note (Signed)
Chronic, ongoing.  Stable at this time.  Continue current medication regimen and collaboration with pulmonary.

## 2021-05-06 NOTE — Progress Notes (Signed)
BP 115/70   Pulse 73   Temp 97.6 F (36.4 C) (Oral)   Ht 5' 8.5" (1.74 m)   Wt 204 lb 6.4 oz (92.7 kg)   SpO2 94%   BMI 30.63 kg/m    Subjective:    Patient ID: Andrew Cobb, male    DOB: [birth year], 70 y.o.   MRN: [id number]  HPI: Andrew Cobb is a 70 y.o. male presenting on 05/06/2021 for comprehensive medical examination. Current medical complaints include:none  He currently lives with: wife Interim Problems from his last visit: no  UPPER RESPIRATORY TRACT INFECTION Does have a current cough, felt bad about 5-6 days ago.  Using cough drops.  Is Covid vaccinated. Fever: no Cough: yes Shortness of breath: no Wheezing: yes Chest pain: no Chest tightness: yes Chest congestion: yes Nasal congestion: no Runny nose: no Post nasal drip: no Sneezing: no Sore throat: yes Swollen glands: no Sinus pressure: no Headache: no Face pain: no Toothache: no Ear pain: none Ear pressure: none Eyes red/itching:no Eye drainage/crusting: no  Vomiting: no Rash: no Fatigue: yes Sick contacts: yes Strep contacts: no  Context: stable Recurrent sinusitis: no Relief with OTC cold/cough medications: yes  Treatments attempted: mucinex    DIABETES Last A1C in August 6.8%.  He continues on Metformin 1000 MG BID and Jardiance 10 MG -- gets assistance with Jardiance.  Has history of bariatric surgery > 8 years ago.    Followed by ophthalmology for macular degeneration and gets injections -- saw last 04/18/21. Hypoglycemic episodes:no Polydipsia/polyuria: no Visual disturbance: no Chest pain: no Paresthesias: no Glucose Monitoring: yes             Accucheck frequency: Daily             Fasting glucose: 110-120             Post prandial:             Evening:             Before meals: Taking Insulin?: no             Long acting insulin:             Short acting insulin: Blood Pressure Monitoring: daily Retinal Examination: Up to Date Foot Exam: Up to Date Pneumovax: Up  To Date Influenza: Up to Date Aspirin: yes    ANEMIA Had recent colonoscopy and recent labs reassuring. Anemia status: stable Etiology of anemia: Duration of anemia treatment:  Compliance with treatment: good compliance Iron supplementation side effects: no Severity of anemia: mild Fatigue: no Decreased exercise tolerance: no  Dyspnea on exertion: no Palpitations: no Bleeding: no Pica: no    HYPERTENSION / HYPERLIPIDEMIA Continues Telmisartan 40 MG and Atorvastatin 40 MG daily.  Was noted to have aortic atherosclerosis on imaging 07/11/20.    Continues to use CPAP nightly, is awaiting on new machine, supplies are behind   Followed by neurology due to history of stroke events x 3. Has loop recorder in place.  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  Saw cardiology last 11/16/20. Satisfied with current treatment? yes Duration of hypertension: chronic BP monitoring frequency: daily BP range: 110 -120/70 BP medication side effects: no Duration of hyperlipidemia: chronic Cholesterol medication side effects: no Cholesterol supplements: none Medication compliance: good compliance Aspirin: yes Recent stressors: no Recurrent headaches: no Visual changes: no Palpitations: no Dyspnea: yes Chest pain: no Lower extremity edema: no Dizzy/lightheaded: no    HISTORY OF PROSTATE CA: Followed  by Dr. Bernardo Heater in past.  Last seen 11/24/2019 and went through IMRT for 40 days, reports he is tolerated this well.  Last treatment was on 02/02/2020.  Saw Dr. Baruch Gouty last 12/06/20, oncology and returns in December -- he checks PSA.   SEIZURES: Seen by neurology on 12/26/20 and recommendation to continue Keppra 500 MG BID + ASA and Atorvastatin.  No recent seizure activity. Does exercise frequently and stays active.  Continues to have loop recorder in place.    Functional Status Survey: Is the patient deaf or have difficulty hearing?: Yes Does the patient have difficulty seeing, even when  wearing glasses/contacts?: No Does the patient have difficulty concentrating, remembering, or making decisions?: No Does the patient have difficulty walking or climbing stairs?: No Does the patient have difficulty dressing or bathing?: No Does the patient have difficulty doing errands alone such as visiting a doctor's office or shopping?: No  FALL RISK: Fall Risk  05/06/2021 07/20/2020 07/02/2020 07/18/2019 06/21/2019  Falls in the past year? 0 1 0 1 0  Comment - tripped - - -  Number falls in past yr: 0 0 0 0 0  Injury with Fall? 0 0 0 0 0  Risk for fall due to : No Fall Risks History of fall(s);Medication side effect - - -  Follow up Falls evaluation completed Falls evaluation completed;Education provided;Falls prevention discussed - - Falls evaluation completed    Depression Screen Depression screen [facility] 2/9 05/06/2021 07/20/2020 07/02/2020 10/18/2019 09/26/2019  Decreased Interest 0 0 0 0 0  Down, Depressed, Hopeless 0 0 0 0 0  PHQ - 2 Score 0 0 0 0 0  Altered sleeping 0 - - 0 -  Tired, decreased energy 0 - - 0 -  Change in appetite 0 - - 0 -  Feeling bad or failure about yourself  0 - - 0 -  Trouble concentrating 0 - - 0 -  Moving slowly or fidgety/restless 0 - - 0 -  Suicidal thoughts 0 - - 0 -  PHQ-9 Score 0 - - 0 -  Difficult doing work/chores Not difficult at all - - Not difficult at all -  Some recent data might be hidden    Advanced Directives <no information>  Past Medical History:  Past Medical History:  Diagnosis Date   Arthritis    Asthma    has not needed since 6/21   Cancer [facility])    prostate   Chronic venous insufficiency    varicose vein lower extremity with inflammation   Coronary artery disease 1996   two stents placed    Diabetes mellitus without complication (HCC)    type 2 on metformin   GERD (gastroesophageal reflux disease)    no issues since gastric bypass surgery as stated per pt   Hyperlipidemia    Hypertension    Hypogonadism in male     MRSA (methicillin resistant Staphylococcus aureus) infection    07/30/2008 thru 08/07/2008  abdominal abcess   Sleep apnea    on BIPAP   Stented coronary artery    Stroke ([place]) 09/07/2019   no defecits   Thrombocythemia     Surgical History:  Past Surgical History:  Procedure Laterality Date   ANGIOPLASTY     ANGIOPLASTY     with stent 04/07/1995   ANTERIOR CERVICAL DECOMPRESSION/DISCECTOMY FUSION 4 LEVELS N/A 08/17/2017   Procedure: Anterior discectomy with fusion and plate fixation Cervical Three-Four, Four-Five, Five-Six, and Six-Seven Fusion;  Surgeon: Ditty, Kevan Ny, MD;  [facility]  OR;  Service: Neurosurgery;  Laterality: N/A;  Anterior discectomy with fusion and plate fixation Cervical Three-Four, Four-Five, Five-Six, and Six-Seven Fusion    APPENDECTOMY     1966   BUBBLE STUDY  09/20/2018   Procedure: BUBBLE STUDY;  Surgeon: Josue Hector, MD;  Location: [facility] ENDOSCOPY;  Service: Cardiovascular;;   [place] TEST     07/31/2011   CARPAL TUNNEL RELEASE Left    CHOLECYSTECTOMY     2006   COLONOSCOPY WITH PROPOFOL N/A 03/26/2021   Procedure: COLONOSCOPY WITH PROPOFOL;  Surgeon: Jonathon Bellows, MD;  Location: [facility] ENDOSCOPY;  Service: Gastroenterology;  Laterality: N/A;   colonscopy      08/25/2012   EYE SURGERY Bilateral    cataract   FUNCTIONAL ENDOSCOPIC SINUS SURGERY     11/10/2013   GASTRIC BYPASS     10/05/2012   HERNIA REPAIR     left inguinal 1981   INCISION AND DRAINAGE ABSCESS Right 02/26/2017   Procedure: INCISION AND DRAINAGE ABSCESS;  Surgeon: Nickie Retort, MD;  Location: [facility] ORS;  Service: Urology;  Laterality: Right;   JOINT REPLACEMENT     bilateral   left ankle surgery      05/03/2003    left carpel tunnel      09/18/1993   left knee meniscal tear      01/25/2010   left knee meniscal tear repair      05/04/1996   left rotator cuff repair      05/03/2003    LOOP RECORDER INSERTION N/A 09/20/2018    Procedure: LOOP RECORDER INSERTION;  Surgeon: Evans Lance, MD;  Location: [facility];  Service: Cardiovascular;  Laterality: N/A;   REPLACEMENT TOTAL KNEE BILATERAL  07/13/2015   REVERSE SHOULDER ARTHROPLASTY Left 08/30/2020   Procedure: REVERSE SHOULDER ARTHROPLASTY;  Surgeon: Justice Britain, MD;  Location: [facility] ORS;  Service: Orthopedics;  Laterality: Left;  167min   right ankle surgery      fracture has 2 screws 07/07/1997   right carpel tunnel      05/16/1992   right shoulder replacement      01/27/2006   SCROTAL EXPLORATION Right 02/26/2017   Procedure: SCROTUM EXPLORATION;  Surgeon: Nickie Retort, MD;  Location: [facility] ORS;  Service: Urology;  Laterality: Right;   TEE WITHOUT CARDIOVERSION N/A 09/20/2018   Procedure: TRANSESOPHAGEAL ECHOCARDIOGRAM (TEE);  Surgeon: Josue Hector, MD;  Location: [facility] ENDOSCOPY;  Service: Cardiovascular;  Laterality: N/A;   TOTAL KNEE ARTHROPLASTY Left 07/13/2015   Procedure: LEFT TOTAL KNEE ARTHROPLASTY;  Surgeon: Gaynelle Arabian, MD;  Location: [facility] ORS;  Service: Orthopedics;  Laterality: Left;    Medications:  Current Outpatient Medications on File Prior to Visit  Medication Sig   acyclovir (ZOVIRAX) 400 MG tablet Take 1 tablet (400 mg total) by mouth 2 (two) times daily.   amitriptyline (ELAVIL) 25 MG tablet Take 1 tablet (25 mg total) by mouth at bedtime.   aspirin EC 81 MG EC tablet Take 1 tablet (81 mg total) by mouth daily.   atorvastatin (LIPITOR) 40 MG tablet Take 1 tablet (40 mg total) by mouth at bedtime.   betamethasone dipropionate (DIPROLENE) 0.05 % ointment Apply 1 application topically 2 (two) times daily as needed for rash.   cetirizine (ZYRTEC) 10 MG tablet Take 10 mg by mouth daily.   Cholecalciferol (VITAMIN D) 2000 units CAPS Take 2,000 Units by mouth daily.   clobetasol ointment (TEMOVATE) 0.05 % Apply topically 2 (two)  times daily.   Cyanocobalamin (B-12) 5000 MCG CAPS Take 5,000 mcg by mouth every other day.   empagliflozin  (JARDIANCE) 10 MG TABS tablet Take 1 tablet (10 mg total) by mouth daily before breakfast.   glucose blood (ONETOUCH ULTRA) test strip 1 each by Other route 2 (two) times daily. Use as instructed   Lancets (ONETOUCH ULTRASOFT) lancets USE 1  TO CHECK GLUCOSE TWICE DAILY   levETIRAcetam (KEPPRA) 500 MG tablet Take 1 tablet (500 mg total) by mouth 2 (two) times daily.   Multiple Vitamins-Minerals (MULTIVITAMIN PO) Take 1 tablet by mouth daily.    telmisartan (MICARDIS) 80 MG tablet Take 0.5 tablets (40 mg total) by mouth daily.   No current facility-administered medications on file prior to visit.    Allergies:  Allergies  Allergen Reactions   Succinylsulphathiazole Rash   Sulfamethoxazole-Trimethoprim Rash   Tetracyclines & Related Rash    Social History:  Social History   Socioeconomic History   Marital status: Married    Spouse name: Not on file   Number of children: Not on file   Years of education: Not on file   Highest education level: Some college, no degree  Occupational History    Comment: drives for [employer]   Tobacco Use   Smoking status: Former    Packs/day: 1.00    Years: 10.00    Pack years: 10.00    Types: Cigarettes    Quit date: 07/07/1984    Years since quitting: 36.8   Smokeless tobacco: Never   Tobacco comments:    quit 1986  Vaping Use   Vaping Use: Never used  Substance and Sexual Activity   Alcohol use: No    Alcohol/week: 0.0 standard drinks   Drug use: No   Sexual activity: Yes  Other Topics Concern   Not on file  Social History Narrative   Not on file   Social Determinants of Health   Financial Resource Strain: Low Risk    Difficulty of Paying Living Expenses: Not hard at all  Food Insecurity: No Food Insecurity   Worried About Charity fundraiser in the Last Year: Never true   [place] in the Last Year: Never true  Transportation Needs: No Transportation Needs   Lack of Transportation (Medical): No   Lack of Transportation  (Non-Medical): No  Physical Activity: Sufficiently Active   Days of Exercise per Week: 5 days   Minutes of Exercise per Session: 40 min  Stress: No Stress Concern Present   Feeling of Stress : Not at all  Social Connections: Not on file  Intimate Partner Violence: Not on file   Social History   Tobacco Use  Smoking Status Former   Packs/day: 1.00   Years: 10.00   Pack years: 10.00   Types: Cigarettes   Quit date: 07/07/1984   Years since quitting: 36.8  Smokeless Tobacco Never  Tobacco Comments   quit 1986   Social History   Substance and Sexual Activity  Alcohol Use No   Alcohol/week: 0.0 standard drinks    Family History:  Family History  Problem Relation Age of Onset   Cancer Mother        pancreatic   Diabetes Mother    Stroke Mother    Heart disease Mother    Hyperlipidemia Mother    Hypertension Mother    Heart attack Mother    Heart disease Father    Stroke Father    Diabetes Father  Hypertension Father    Heart attack Father    Hyperlipidemia Father    Pancreatic cancer Father    Cancer Sister    Cancer Brother        lung   Cancer Brother    Kidney cancer Neg Hx    Bladder Cancer Neg Hx    Prostate cancer Neg Hx     Past medical history, surgical history, medications, allergies, family history and social history reviewed with patient today and changes made to appropriate areas of the chart.   ROS All other ROS negative except what is listed above and in the HPI.      Objective:    BP 115/70   Pulse 73   Temp 97.6 F (36.4 C) (Oral)   Ht 5' 8.5" (1.74 m)   Wt 204 lb 6.4 oz (92.7 kg)   SpO2 94%   BMI 30.63 kg/m   Wt Readings from Last 3 Encounters:  05/06/21 204 lb 6.4 oz (92.7 kg)  03/26/21 200 lb (90.7 kg)  02/21/21 207 lb 6.4 oz (94.1 kg)    No results found.  Physical Exam Vitals and nursing note reviewed.  Constitutional:      General: He is awake. He is not in acute distress.    Appearance: He is well-developed and  well-groomed. He is not ill-appearing or toxic-appearing.  HENT:     Head: Normocephalic and atraumatic.     Right Ear: Hearing, tympanic membrane, ear canal and external ear normal. No drainage.     Left Ear: Hearing, tympanic membrane, ear canal and external ear normal. No drainage.     Nose: Nose normal.     Mouth/Throat:     Pharynx: Uvula midline.  Eyes:     General: Lids are normal.        Right eye: No discharge.        Left eye: No discharge.     Extraocular Movements: Extraocular movements intact.     Conjunctiva/sclera: Conjunctivae normal.     Pupils: Pupils are equal, round, and reactive to light.     Visual Fields: Right eye visual fields normal and left eye visual fields normal.  Neck:     Thyroid: No thyromegaly.     Vascular: No carotid bruit or JVD.     Trachea: Trachea normal.  Cardiovascular:     Rate and Rhythm: Normal rate and regular rhythm.     Heart sounds: Normal heart sounds, S1 normal and S2 normal. No murmur heard.   No gallop.  Pulmonary:     Effort: Pulmonary effort is normal. No accessory muscle usage or respiratory distress.     Breath sounds: Normal breath sounds.  Abdominal:     General: Bowel sounds are normal.     Palpations: Abdomen is soft. There is no hepatomegaly or splenomegaly.     Tenderness: There is no abdominal tenderness.  Musculoskeletal:        General: Normal range of motion.     Cervical back: Normal range of motion and neck supple.     Right lower leg: No edema.     Left lower leg: No edema.  Lymphadenopathy:     Head:     Right side of head: No submental, submandibular, tonsillar, preauricular or posterior auricular adenopathy.     Left side of head: No submental, submandibular, tonsillar, preauricular or posterior auricular adenopathy.     Cervical: No cervical adenopathy.  Skin:    General: Skin is warm and  dry.     Capillary Refill: Capillary refill takes less than 2 seconds.     Findings: No rash.  Neurological:      Mental Status: He is alert and oriented to person, place, and time.     Gait: Gait is intact.     Deep Tendon Reflexes: Reflexes are normal and symmetric.     Reflex Scores:      Brachioradialis reflexes are 2+ on the right side and 2+ on the left side.      Patellar reflexes are 2+ on the right side and 2+ on the left side. Psychiatric:        Attention and Perception: Attention normal.        Mood and Affect: Mood normal.        Speech: Speech normal.        Behavior: Behavior normal. Behavior is cooperative.        Thought Content: Thought content normal.        Cognition and Memory: Cognition normal.        Judgment: Judgment normal.    6CIT Screen 07/20/2020 04/22/2018 04/20/2017  What Year? 0 points 0 points 0 points  What month? 0 points 0 points 0 points  What time? 0 points 0 points 0 points  Count back from 20 0 points 0 points 0 points  Months in reverse 4 points 0 points -  Repeat phrase 6 points 0 points 0 points  Total Score 10 0 -   Results for orders placed or performed in visit on 04/29/21  [facility] [place]  Result Value Ref Range   Date Time Interrogation Session [id number]    Pulse Generator Manufacturer MERM    Pulse Gen Model XVQ00 Reveal LINQ    Pulse Gen Serial Number [id number] [facility]    Implantable Pulse Generator Type ICM/ILR    Implantable Pulse Generator Implant Date [id number]    Eval Rhythm SB at 59 bpm       Assessment & Plan:   Problem List Items Addressed This Visit       Cardiovascular and Mediastinum   Hypertension associated with diabetes ([place])    Chronic, ongoing with BP below neuro goal <130/90.  Continue current medication regimen and adjust as needed.  Recommend he monitor BP at home daily and document.  Continue focus on DASH diet and regular exercise at home.  CBC, TSH, and CMP today.  Recommend he wear compression hose at home to avoid syncopal episodes and orthostatic BP.  Return in 3  months.      Relevant Medications   metFORMIN (GLUCOPHAGE) 1000 MG tablet   Other Relevant Orders   Bayer DCA Hb A1c Waived   Microalbumin, Urine Waived   Comprehensive metabolic panel   TSH   Coronary artery disease    Continue ASA and maintaining tight lipid control, may need to increase Atorvastatin to 80 MG.  Lipid panel today.      Exudative age-related macular degeneration of right eye with active choroidal neovascularization (HCC)    Chronic, ongoing, followed by opthalmology.  Continue this collaboration.      Aortic atherosclerosis ([place])    Noted on imaging 07/11/20 and previous imaging.  Recommend continue daily statin therapy and ASA for prevention.        Respiratory   OSA (obstructive sleep apnea)    Continue 100% adherence to CPAP use.  He is awaiting new supplies.  Asthma    Chronic, ongoing.  Stable at this time.  Continue current medication regimen and collaboration with pulmonary.        Endocrine   Uncontrolled type 2 diabetes mellitus with hyperglycemia, without long-term current use of insulin (HCC) - Primary    Chronic, ongoing with A1C in today 6.9% (mild upward trend), plus urine ALB 30 and A:C <30.  Continue Telmisartan for kidney protection. Neuro goal <6.5%, recommend he take medication daily and focus heavily on diet.  Continue  Metformin 1000 MG BID and continue Jardiance 10 MG - obtains via assistance.  May need to increase next visit if elevation above goal ongoing.  Recommend he check BS twice daily + continue focus on regular exercise and diet.  Return in 3 months for A1C check.      Relevant Medications   metFORMIN (GLUCOPHAGE) 1000 MG tablet   Other Relevant Orders   Bayer DCA Hb A1c Waived   Hyperlipidemia associated with type 2 diabetes mellitus (HCC)    Chronic, ongoing.  Continue ASA and maintaining tight lipid control, may need to increase Atorvastatin to 80 MG if ever elevation, recent LDL in 40's.  Continue current Atorvastatin  dosing.  Lipid check today.  Refills sent in as needed.      Relevant Medications   metFORMIN (GLUCOPHAGE) 1000 MG tablet   Other Relevant Orders   Bayer DCA Hb A1c Waived   Comprehensive metabolic panel   Lipid Panel w/o Chol/HDL Ratio     Hematopoietic and Hemostatic   Thrombocytopenia ([place])    Noted on past labs, check CBC today.      Relevant Orders   CBC with Differential/Platelet     Other   History of prostate cancer    Currently no treatment, continue collaboration with oncology and urology.  Recent notes reviewed.      Relevant Medications   tamsulosin (FLOMAX) 0.4 MG CAPS capsule   History of stroke    Continue collaboration with neurology due to significant stroke history.  Continue current medication regimen.      Vitamin D deficiency    Chronic, improved on recent labs.  Continue daily supplement and adjust as needed. Recheck today.      Relevant Orders   VITAMIN D 25 Hydroxy (Vit-D Deficiency, Fractures)   Seizures ([place])    Being followed by neurology.  Continue current Keppra dose as prescribed by them and collaboration with neurology, reviewed notes.  Level next visit.      Low hemoglobin    Recent colonoscopy reassuring.  Recent labs showed improvement, recheck CBC today.      Acute cough    Acute and ongoing for 6 days.  Obtain Covid and Flu testing today.  Will send in Augmentin and viscous Lidocaine for treatment due to length of symptoms.  Recommend: - Increased rest - Increasing Fluids - Acetaminophen as needed for fever/pain.  - Salt water gargling, chloraseptic spray and throat lozenges - Mucinex.  Return to office if ongoing or worsening symptoms.      Relevant Orders   Novel Coronavirus, NAA ([facility])   Veritor Flu A/B Waived   Other Visit Diagnoses     B12 deficiency       History of reported.  Will check today and initiate supplement as needed.   Relevant Orders   Vitamin B12   Pneumococcal vaccination given       PPSV23  provided today.   Relevant Orders   Pneumococcal polysaccharide vaccine 23-valent greater than  or equal to 2yo subcutaneous/IM   Encounter for annual physical exam       Annual physical today with labs and health maintenance reviewed.       Discussed aspirin prophylaxis for myocardial infarction prevention and decision was made to continue ASA  LABORATORY TESTING:  Health maintenance labs ordered today as discussed above.   The natural history of prostate cancer and ongoing controversy regarding screening and potential treatment outcomes of prostate cancer has been discussed with the patient. The meaning of a false positive PSA and a false negative PSA has been discussed. He indicates understanding of the limitations of this screening test and wishes to proceed with screening PSA testing.   IMMUNIZATIONS:   - Tdap: Tetanus vaccination status reviewed: last tetanus booster within 10 years. - Influenza: Up to date - Pneumovax: Administered today - Prevnar: Up to date - Zostavax vaccine: Refused  SCREENING: - Colonoscopy: Up to date  Discussed with patient purpose of the colonoscopy is to detect colon cancer at curable precancerous or early stages   - AAA Screening: Not applicable  -Hearing Test: Not applicable  -Spirometry: Not applicable   PATIENT COUNSELING:    Sexuality: Discussed sexually transmitted diseases, partner selection, use of condoms, avoidance of unintended pregnancy  and contraceptive alternatives.   Advised to avoid cigarette smoking.  I discussed with the patient that most people either abstain from alcohol or drink within safe limits (<=14/week and <=4 drinks/occasion for males, <=7/weeks and <= 3 drinks/occasion for females) and that the risk for alcohol disorders and other health effects rises proportionally with the number of drinks per week and how often a drinker exceeds daily limits.  Discussed cessation/primary prevention of drug use and availability of  treatment for abuse.   Diet: Encouraged to adjust caloric intake to maintain  or achieve ideal body weight, to reduce intake of dietary saturated fat and total fat, to limit sodium intake by avoiding high sodium foods and not adding table salt, and to maintain adequate dietary potassium and calcium preferably from fresh fruits, vegetables, and low-fat dairy products.    Stressed the importance of regular exercise  Injury prevention: Discussed safety belts, safety helmets, smoke detector, smoking near bedding or upholstery.   Dental health: Discussed importance of regular tooth brushing, flossing, and dental visits.   Follow up plan: NEXT PREVENTATIVE PHYSICAL DUE IN 1 YEAR. Return in about 3 months (around 08/06/2021) for T2DM, HTN/HLD, ASTHMA.

## 2021-05-06 NOTE — Assessment & Plan Note (Signed)
Acute and ongoing for 6 days.  Obtain Covid and Flu testing today.  Will send in Augmentin and viscous Lidocaine for treatment due to length of symptoms.  Recommend: - Increased rest - Increasing Fluids - Acetaminophen as needed for fever/pain.  - Salt water gargling, chloraseptic spray and throat lozenges - Mucinex.  Return to office if ongoing or worsening symptoms.

## 2021-05-07 ENCOUNTER — Ambulatory Visit (INDEPENDENT_AMBULATORY_CARE_PROVIDER_SITE_OTHER): Payer: PPO

## 2021-05-07 DIAGNOSIS — Z23 Encounter for immunization: Secondary | ICD-10-CM

## 2021-05-07 LAB — COMPREHENSIVE METABOLIC PANEL
ALT: 26 IU/L (ref 0–44)
AST: 27 IU/L (ref 0–40)
Albumin/Globulin Ratio: 1.9 (ref 1.2–2.2)
Albumin: 4.3 g/dL (ref 3.8–4.8)
Alkaline Phosphatase: 100 IU/L (ref 44–121)
BUN/Creatinine Ratio: 23 (ref 10–24)
BUN: 27 mg/dL (ref 8–27)
Bilirubin Total: 1 mg/dL (ref 0.0–1.2)
CO2: 24 mmol/L (ref 20–29)
Calcium: 8.9 mg/dL (ref 8.6–10.2)
Chloride: 99 mmol/L (ref 96–106)
Creatinine, Ser: 1.2 mg/dL (ref 0.76–1.27)
Globulin, Total: 2.3 g/dL (ref 1.5–4.5)
Glucose: 99 mg/dL (ref 70–99)
Potassium: 5.1 mmol/L (ref 3.5–5.2)
Sodium: 136 mmol/L (ref 134–144)
Total Protein: 6.6 g/dL (ref 6.0–8.5)
eGFR: 65 mL/min/{1.73_m2} (ref >59)

## 2021-05-07 LAB — CBC WITH DIFFERENTIAL/PLATELET
Basophils Absolute: 0 10*3/uL (ref 0.0–0.2)
Basos: 1 %
EOS (ABSOLUTE): 0.3 10*3/uL (ref 0.0–0.4)
Eos: 6 %
Hematocrit: 37 % — ABNORMAL LOW (ref 37.5–51.0)
Hemoglobin: 12.6 g/dL — ABNORMAL LOW (ref 13.0–17.7)
Immature Grans (Abs): 0 10*3/uL (ref 0.0–0.1)
Immature Granulocytes: 0 %
Lymphocytes Absolute: 1 10*3/uL (ref 0.7–3.1)
Lymphs: 21 %
MCH: 30.8 pg (ref 26.6–33.0)
MCHC: 34.1 g/dL (ref 31.5–35.7)
MCV: 91 fL (ref 79–97)
Monocytes Absolute: 0.6 10*3/uL (ref 0.1–0.9)
Monocytes: 13 %
Neutrophils Absolute: 2.7 10*3/uL (ref 1.4–7.0)
Neutrophils: 59 %
Platelets: 147 10*3/uL — ABNORMAL LOW (ref 150–450)
RBC: 4.09 x10E6/uL — ABNORMAL LOW (ref 4.14–5.80)
RDW: 12.5 % (ref 11.6–15.4)
WBC: 4.5 10*3/uL (ref 3.4–10.8)

## 2021-05-07 LAB — LIPID PANEL W/O CHOL/HDL RATIO
Cholesterol, Total: 116 mg/dL (ref 100–199)
HDL: 48 mg/dL (ref >39)
LDL Chol Calc (NIH): 48 mg/dL (ref 0–99)
Triglycerides: 107 mg/dL (ref 0–149)
VLDL Cholesterol Cal: 20 mg/dL (ref 5–40)

## 2021-05-07 LAB — TSH: TSH: 2.35 u[IU]/mL (ref 0.450–4.500)

## 2021-05-07 LAB — VITAMIN D 25 HYDROXY (VIT D DEFICIENCY, FRACTURES): Vit D, 25-Hydroxy: 44 ng/mL (ref 30.0–100.0)

## 2021-05-07 LAB — SARS-COV-2, NAA 2 DAY TAT

## 2021-05-07 LAB — NOVEL CORONAVIRUS, NAA: SARS-CoV-2, NAA: DETECTED — AB

## 2021-05-07 LAB — VITAMIN B12: Vitamin B-12: >2000 pg/mL — ABNORMAL HIGH (ref 232–1245)

## 2021-05-07 NOTE — Progress Notes (Signed)
Spoke to patient on phone, is past the period for oral treatment and reports feeling better today.  Have recommend he mask for the next 5 days, as has been symptomatic for 6-7 days now.  Continue to hydrate well and if worsening symptoms immediately alert provider.

## 2021-05-07 NOTE — Progress Notes (Signed)
Carelink Summary Report / Loop Recorder 

## 2021-05-07 NOTE — Telephone Encounter (Signed)
I have spoke with patient letting him know we need a manual transmission and he states he will send it soon as we get off the phone. Patient states he does not need help sending at this time. I have let patient know as soon as we get that transmission a nurse will call him back

## 2021-05-07 NOTE — Progress Notes (Signed)
Contacted via [place] morning Andrew Cobb, your labs have returned: - CBC shows mildly low hemoglobin again, we will recheck this next visit, platelets mildly low as well but increased from previous visit.  May send you to hematology if any worsening. - Kidney function, creatinine and eGFR, is normal, as is liver function, AST and ALT. - Cholesterol levels are at goal. - Thyroid and Vitamin D are normal.   - B12 level is above normal now, I recommend cutting back on Vitamin B12 supplement to every other day.  Any questions? Keep being amazing!!  Thank you for allowing me to participate in your care.  I appreciate you. Kindest regards, Jerrelle Michelsen

## 2021-05-07 NOTE — Telephone Encounter (Signed)
Reviewed EGM's w/ Dr. Lovena Le and agreeable, EMG's appear AF. Verbal orders obtained to send referral to AF clinic to start discuss starting [place]. Patient advised of AF found on ILR and discussed plan advised by Dr. Lovena Le. Patient asymptomatic and was unaware. Agreeable to AF clinic. Advised I will send referral over and they will call with apt. Patient verbalized understanding.

## 2021-05-08 NOTE — Telephone Encounter (Signed)
Called and spoke with patient.  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  Directions and phone number to clinic provided to patient's wife, Nancy-EC on file, per pt's request

## 2021-05-16 ENCOUNTER — Ambulatory Visit (HOSPITAL_COMMUNITY)
Admission: RE | Admit: 2021-05-16 | Discharge: 2021-05-16 | Disposition: A | Discharge status: Home / Self Care | Payer: PPO | Source: Ambulatory Visit | Attending: Physician Assistant | Admitting: Physician Assistant

## 2021-05-16 ENCOUNTER — Encounter (HOSPITAL_COMMUNITY): Payer: Self-pay | Admitting: Physician Assistant

## 2021-05-16 VITALS — BP 118/78 | HR 96 | Ht 68.5 in | Wt 201.0 lb

## 2021-05-16 DIAGNOSIS — I2510 Atherosclerotic heart disease of native coronary artery without angina pectoris: Secondary | ICD-10-CM | POA: Insufficient documentation

## 2021-05-16 DIAGNOSIS — Z87891 Personal history of nicotine dependence: Secondary | ICD-10-CM | POA: Insufficient documentation

## 2021-05-16 DIAGNOSIS — Z8673 Personal history of transient ischemic attack (TIA), and cerebral infarction without residual deficits: Secondary | ICD-10-CM | POA: Insufficient documentation

## 2021-05-16 DIAGNOSIS — E119 Type 2 diabetes mellitus without complications: Secondary | ICD-10-CM | POA: Diagnosis not present

## 2021-05-16 DIAGNOSIS — I4892 Unspecified atrial flutter: Secondary | ICD-10-CM | POA: Insufficient documentation

## 2021-05-16 DIAGNOSIS — I10 Essential (primary) hypertension: Secondary | ICD-10-CM | POA: Diagnosis not present

## 2021-05-16 DIAGNOSIS — Z6830 Body mass index (BMI) 30.0-30.9, adult: Secondary | ICD-10-CM | POA: Diagnosis not present

## 2021-05-16 DIAGNOSIS — E669 Obesity, unspecified: Secondary | ICD-10-CM | POA: Insufficient documentation

## 2021-05-16 DIAGNOSIS — G4733 Obstructive sleep apnea (adult) (pediatric): Secondary | ICD-10-CM | POA: Diagnosis not present

## 2021-05-16 DIAGNOSIS — I4819 Other persistent atrial fibrillation: Secondary | ICD-10-CM | POA: Diagnosis not present

## 2021-05-16 DIAGNOSIS — Z8616 Personal history of COVID-19: Secondary | ICD-10-CM | POA: Insufficient documentation

## 2021-05-16 DIAGNOSIS — D6869 Other thrombophilia: Secondary | ICD-10-CM | POA: Diagnosis not present

## 2021-05-16 MED ORDER — APIXABAN 5 MG PO TABS: 5.0000 mg | ORAL_TABLET | Freq: Two times a day (BID) | ORAL | 11 refills | Status: DC

## 2021-05-16 NOTE — Progress Notes (Signed)
Primary Care Physician: [name], NP Primary Cardiologist: Dr Josefa Half Primary Electrophysiologist: Dr Lovena Le Referring Physician: Dr [facility]   Andrew Cobb is a 70 y.o. male with a history of CAD, DM, OSA, HTN, prior CVA, atrial fibrillation who presents for follow up in the [facility].  The patient was initially diagnosed with atrial fibrillation on ILR 05/07/21 which was implanted for cryptogenic CVA in 09/2018. Patient has a CHADS2VASC score of 6. He was unaware of his arrhythmia. Of note, he was also diagnosed with COVID around the same time. He reports good compliance with his CPAP machine. He denies significant alcohol use.   Today, he denies symptoms of palpitations, chest pain, shortness of breath, orthopnea, PND, lower extremity edema, dizziness, presyncope, syncope, snoring, daytime somnolence, bleeding, or neurologic sequela. The patient is tolerating medications without difficulties and is otherwise without complaint today.    Atrial Fibrillation Risk Factors:  he does have symptoms or diagnosis of sleep apnea. he is compliant with CPAP therapy. he does not have a history of rheumatic fever. he does not have a history of alcohol use. The patient does have a history of early familial atrial fibrillation or other arrhythmias. Father and brother have afib.  he has a BMI of Body mass index is 30.12 kg/m.[name] Kitchen Filed Weights   05/16/21 0912  Weight: 91.2 kg    Family History  Problem Relation Age of Onset   Cancer Mother        pancreatic   Diabetes Mother    Stroke Mother    Heart disease Mother    Hyperlipidemia Mother    Hypertension Mother    Heart attack Mother    Heart disease Father    Stroke Father    Diabetes Father    Hypertension Father    Heart attack Father    Hyperlipidemia Father    Pancreatic cancer Father    Cancer Sister    Cancer Brother        lung   Cancer Brother    Kidney cancer Neg Hx     Bladder Cancer Neg Hx    Prostate cancer Neg Hx      Atrial Fibrillation Management history:  Previous antiarrhythmic drugs: none Previous cardioversions: none Previous ablations: none CHADS2VASC score: 6 Anticoagulation history: none   Past Medical History:  Diagnosis Date   Arthritis    Asthma    has not needed since 6/21   Cancer [facility])    prostate   Chronic venous insufficiency    varicose vein lower extremity with inflammation   Coronary artery disease 1996   two stents placed    Diabetes mellitus without complication ([place])    type 2 on metformin   GERD (gastroesophageal reflux disease)    no issues since gastric bypass surgery as stated per pt   Hyperlipidemia    Hypertension    Hypogonadism in male    MRSA (methicillin resistant Staphylococcus aureus) infection    07/30/2008 thru 08/07/2008  abdominal abcess   Sleep apnea    on BIPAP   Stented coronary artery    Stroke ([place]) 09/07/2019   no defecits   Thrombocythemia    Past Surgical History:  Procedure Laterality Date   ANGIOPLASTY     ANGIOPLASTY     with stent 04/07/1995   ANTERIOR CERVICAL DECOMPRESSION/DISCECTOMY FUSION 4 LEVELS N/A 08/17/2017   Procedure: Anterior discectomy with fusion and plate fixation Cervical Three-Four, Four-Five, Five-Six, and Six-Seven Fusion;  Surgeon: Ditty, Kevan Ny, MD;  Location: [facility];  Service: Neurosurgery;  Laterality: N/A;  Anterior discectomy with fusion and plate fixation Cervical Three-Four, Four-Five, Five-Six, and Six-Seven Fusion    APPENDECTOMY     1966   BUBBLE STUDY  09/20/2018   Procedure: BUBBLE STUDY;  Surgeon: Josue Hector, MD;  Location: [facility] ENDOSCOPY;  Service: Cardiovascular;;   [place] TEST     07/31/2011   CARPAL TUNNEL RELEASE Left    CHOLECYSTECTOMY     2006   COLONOSCOPY WITH PROPOFOL N/A 03/26/2021   Procedure: COLONOSCOPY WITH PROPOFOL;  Surgeon: Jonathon Bellows, MD;  Location: [facility] ENDOSCOPY;   Service: Gastroenterology;  Laterality: N/A;   colonscopy      08/25/2012   EYE SURGERY Bilateral    cataract   FUNCTIONAL ENDOSCOPIC SINUS SURGERY     11/10/2013   GASTRIC BYPASS     10/05/2012   HERNIA REPAIR     left inguinal 1981   INCISION AND DRAINAGE ABSCESS Right 02/26/2017   Procedure: INCISION AND DRAINAGE ABSCESS;  Surgeon: Nickie Retort, MD;  Location: [facility] ORS;  Service: Urology;  Laterality: Right;   JOINT REPLACEMENT     bilateral   left ankle surgery      05/03/2003    left carpel tunnel      09/18/1993   left knee meniscal tear      01/25/2010   left knee meniscal tear repair      05/04/1996   left rotator cuff repair      05/03/2003    LOOP RECORDER INSERTION N/A 09/20/2018   Procedure: LOOP RECORDER INSERTION;  Surgeon: Evans Lance, MD;  Location: [facility] CV LAB;  Service: Cardiovascular;  Laterality: N/A;   REPLACEMENT TOTAL KNEE BILATERAL  07/13/2015   REVERSE SHOULDER ARTHROPLASTY Left 08/30/2020   Procedure: REVERSE SHOULDER ARTHROPLASTY;  Surgeon: Justice Britain, MD;  Location: [facility] ORS;  Service: Orthopedics;  Laterality: Left;  167min   right ankle surgery      fracture has 2 screws 07/07/1997   right carpel tunnel      05/16/1992   right shoulder replacement      01/27/2006   SCROTAL EXPLORATION Right 02/26/2017   Procedure: SCROTUM EXPLORATION;  Surgeon: Nickie Retort, MD;  Location: [facility] ORS;  Service: Urology;  Laterality: Right;   TEE WITHOUT CARDIOVERSION N/A 09/20/2018   Procedure: TRANSESOPHAGEAL ECHOCARDIOGRAM (TEE);  Surgeon: Josue Hector, MD;  Location: [facility] ENDOSCOPY;  Service: Cardiovascular;  Laterality: N/A;   TOTAL KNEE ARTHROPLASTY Left 07/13/2015   Procedure: LEFT TOTAL KNEE ARTHROPLASTY;  Surgeon: Gaynelle Arabian, MD;  Location: [facility] ORS;  Service: Orthopedics;  Laterality: Left;    Current Outpatient Medications  Medication Sig Dispense Refill   acyclovir (ZOVIRAX) 400 MG tablet Take 1 tablet (400 mg total) by mouth 2 (two)  times daily. 180 tablet 4   amitriptyline (ELAVIL) 25 MG tablet Take 1 tablet (25 mg total) by mouth at bedtime. 90 tablet 4   aspirin EC 81 MG EC tablet Take 1 tablet (81 mg total) by mouth daily. 30 tablet 0   atorvastatin (LIPITOR) 40 MG tablet Take 1 tablet (40 mg total) by mouth at bedtime. 90 tablet 4   betamethasone dipropionate (DIPROLENE) 0.05 % ointment Apply 1 application topically 2 (two) times daily as needed for rash.     cetirizine (ZYRTEC) 10 MG tablet Take 10 mg by mouth daily.     Cholecalciferol (VITAMIN D)  2000 units CAPS Take 2,000 Units by mouth daily.     clobetasol ointment (TEMOVATE) 0.05 % Apply topically 2 (two) times daily.     Cyanocobalamin (B-12) 5000 MCG CAPS Take 5,000 mcg by mouth every other day.     empagliflozin (JARDIANCE) 10 MG TABS tablet Take 1 tablet (10 mg total) by mouth daily before breakfast. 90 tablet 3   glucose blood (ONETOUCH ULTRA) test strip 1 each by Other route 2 (two) times daily. Use as instructed 100 each 6   Lancets (ONETOUCH ULTRASOFT) lancets USE 1  TO CHECK GLUCOSE TWICE DAILY 100 each 11   levETIRAcetam (KEPPRA) 500 MG tablet Take 1 tablet (500 mg total) by mouth 2 (two) times daily. 180 tablet 3   metFORMIN (GLUCOPHAGE) 1000 MG tablet Take 1 tablet (1,000 mg total) by mouth 2 (two) times daily with a meal. 180 tablet 4   Multiple Vitamins-Minerals (MULTIVITAMIN PO) Take 1 tablet by mouth daily.      tamsulosin (FLOMAX) 0.4 MG CAPS capsule Take 1 capsule (0.4 mg total) by mouth at bedtime. 90 capsule 4   telmisartan (MICARDIS) 80 MG tablet Take 0.5 tablets (40 mg total) by mouth daily. 45 tablet 4   No current facility-administered medications for this encounter.    Allergies  Allergen Reactions   Succinylsulphathiazole Rash   Sulfamethoxazole-Trimethoprim Rash   Tetracyclines & Related Rash    Social History   Socioeconomic History   Marital status: Married    Spouse name: Not on file   Number of children: Not on file    Years of education: Not on file   Highest education level: Some college, no degree  Occupational History    Comment: drives for [employer]   Tobacco Use   Smoking status: Former    Packs/day: 1.00    Years: 10.00    Pack years: 10.00    Types: Cigarettes    Quit date: 07/07/1984    Years since quitting: 36.8   Smokeless tobacco: Never   Tobacco comments:    quit 1986  Vaping Use   Vaping Use: Never used  Substance and Sexual Activity   Alcohol use: No    Alcohol/week: 0.0 standard drinks   Drug use: No   Sexual activity: Yes  Other Topics Concern   Not on file  Social History Narrative   Not on file   Social Determinants of Health   Financial Resource Strain: Low Risk    Difficulty of Paying Living Expenses: Not hard at all  Food Insecurity: No Food Insecurity   Worried About Charity fundraiser in the Last Year: Never true   [place] in the Last Year: Never true  Transportation Needs: No Transportation Needs   Lack of Transportation (Medical): No   Lack of Transportation (Non-Medical): No  Physical Activity: Sufficiently Active   Days of Exercise per Week: 5 days   Minutes of Exercise per Session: 40 min  Stress: No Stress Concern Present   Feeling of Stress : Not at all  Social Connections: Not on file  Intimate Partner Violence: Not on file     ROS- All systems are reviewed and negative except as per the HPI above.  Physical Exam: Vitals:   05/16/21 0912  BP: 118/78  Pulse: 96  Weight: 91.2 kg  Height: 5' 8.5" (1.74 m)    GEN- The patient is a well appearing obese male, alert and oriented x 3 today.   Head- normocephalic, atraumatic Eyes-  Sclera clear, conjunctiva pink Ears- hearing intact Oropharynx- clear Neck- supple  Lungs- Clear to ausculation bilaterally, normal work of breathing Heart- irregular rate and rhythm, no murmurs, rubs or gallops  GI- soft, NT, ND, + BS Extremities- no clubbing, cyanosis, or edema MS- no significant deformity  or atrophy Skin- no rash or lesion Psych- euthymic mood, full affect Neuro- strength and sensation are intact  Wt Readings from Last 3 Encounters:  05/16/21 91.2 kg  05/06/21 92.7 kg  03/26/21 90.7 kg    EKG today demonstrates  Atypical atrial flutter with variable block vs coarse afib Vent. rate 96 BPM PR interval * ms QRS duration 92 ms QT/QTcB 302/381 ms  Echo 09/19/18 demonstrated  1. The left ventricle has normal systolic function with an ejection  fraction of 60-65%. The cavity size was normal. There is mild concentric left ventricular hypertrophy. Left ventricular diastolic Doppler parameters are consistent with impaired relaxation.   2. The mitral valve is grossly normal.   3. The tricuspid valve is grossly normal.   4. The aortic valve is tricuspid Aortic valve regurgitation is trivial by  color flow Doppler. Moderate annular calcification seen.   5. The right ventricle has normal systolic function. The cavity was  normal. There is no increase in right ventricular wall thickness.   Epic records are reviewed at length today  CHA2DS2-VASc Score = 6  The patient's score is based upon: CHF History: 0 HTN History: 1 Diabetes History: 1 Stroke History: 2 Vascular Disease History: 1 Age Score: 1 Gender Score: 0      ASSESSMENT AND PLAN: 1. Persistent Atrial Fibrillation/atrial flutter The patient's CHA2DS2-VASc score is 6, indicating a 9.7% annual risk of stroke.   General education about afib provided and questions answered. We also discussed his stroke risk and the risks and benefits of anticoagulation. Start Eliquis 5 mg BID, stop ASA (stent was in 1996) Recent lab work reviewed, hgb and platelets borderline low, followed by PCP. Will plan for DCCV after 3 weeks of uninterrupted anticoagulation.   2. Secondary Hypercoagulable State (ICD10:  D68.69) The patient is at significant risk for stroke/thromboembolism based upon his CHA2DS2-VASc Score of 6.  Start  Apixaban (Eliquis).   3. Obesity Body mass index is 30.12 kg/m. Lifestyle modification was discussed at length including regular exercise and weight reduction.  4. Obstructive sleep apnea The importance of adequate treatment of sleep apnea was discussed today in order to improve our ability to maintain sinus rhythm long term. Patient reports compliance with CPAP therapy.  5. CAD No anginal symptoms.  6. HTN Stable, no changes today.   Follow up in the AF clinic post DCCV.    [facility] [address] [place], [place] [address] [phone] 05/16/2021 9:17 AM

## 2021-05-16 NOTE — Patient Instructions (Signed)
STOP Aspirin 81mg   START Eliquis 5mg  twice daily  Day of Cardioversion Stop by office for labs  Cardioversion scheduled for Tuesday December 6  - Arrive at the [facility] and go to admitting at 9:30am  -Do not eat or drink anything after midnight the night prior to your procedure.  - Take all your morning medication with a sip of water prior to arrival EXCEPT Diabetic Medication  -Do NOT miss any doses of blood thinner if you do please call office.  - You will not be able to drive home after your procedure.  Patients will be asked to: to mask in public and hand hygiene (no longer quarantine) in the 3 days prior to surgery, to report if any COVID-19-like illness or household contacts to COVID-19 to determine need for testing

## 2021-05-23 ENCOUNTER — Encounter (INDEPENDENT_AMBULATORY_CARE_PROVIDER_SITE_OTHER): Payer: PPO | Admitting: Ophthalmology

## 2021-05-27 ENCOUNTER — Encounter (HOSPITAL_COMMUNITY): Payer: Self-pay | Admitting: Cardiology

## 2021-05-28 LAB — CUP PACEART REMOTE DEVICE CHECK
Date Time Interrogation Session: 20221121223843
Implantable Pulse Generator Implant Date: 20200316

## 2021-06-03 ENCOUNTER — Ambulatory Visit (INDEPENDENT_AMBULATORY_CARE_PROVIDER_SITE_OTHER): Payer: PPO

## 2021-06-03 DIAGNOSIS — I639 Cerebral infarction, unspecified: Secondary | ICD-10-CM

## 2021-06-10 ENCOUNTER — Ambulatory Visit: Payer: PPO

## 2021-06-11 ENCOUNTER — Other Ambulatory Visit (HOSPITAL_COMMUNITY): Payer: PPO | Admitting: Physician Assistant

## 2021-06-11 NOTE — Progress Notes (Signed)
Carelink Summary Report / Loop Recorder 

## 2021-06-18 ENCOUNTER — Ambulatory Visit (HOSPITAL_COMMUNITY): Payer: PPO | Admitting: Physician Assistant

## 2021-06-21 ENCOUNTER — Encounter (HOSPITAL_COMMUNITY): Payer: Self-pay | Admitting: Internal Medicine

## 2021-06-24 ENCOUNTER — Encounter: Payer: Self-pay | Admitting: Neurology

## 2021-06-24 ENCOUNTER — Telehealth: Payer: Self-pay | Admitting: Adult Health

## 2021-06-24 NOTE — Telephone Encounter (Signed)
Pt's wife, Kiyon Fidalgo (on [name]) want to know  if looper recorder battery can be replacement while having heart shocked back into rhythm on December 29 at Afib clinic.

## 2021-06-24 NOTE — Telephone Encounter (Signed)
Contacted wife back, informed her she will need to contact cardio regarding the loop recorder as we do not manage that here in our office. She understood and was appreciative.

## 2021-06-24 NOTE — Telephone Encounter (Signed)
Please advise 

## 2021-06-27 ENCOUNTER — Other Ambulatory Visit: Payer: Self-pay | Admitting: *Deleted

## 2021-06-27 DIAGNOSIS — Z8546 Personal history of malignant neoplasm of prostate: Secondary | ICD-10-CM

## 2021-07-02 ENCOUNTER — Encounter (INDEPENDENT_AMBULATORY_CARE_PROVIDER_SITE_OTHER): Payer: PPO | Admitting: Ophthalmology

## 2021-07-02 ENCOUNTER — Ambulatory Visit (INDEPENDENT_AMBULATORY_CARE_PROVIDER_SITE_OTHER): Payer: PPO

## 2021-07-02 DIAGNOSIS — I639 Cerebral infarction, unspecified: Secondary | ICD-10-CM | POA: Diagnosis not present

## 2021-07-02 LAB — CUP PACEART REMOTE DEVICE CHECK
Date Time Interrogation Session: 20221224224008
Implantable Pulse Generator Implant Date: 20200316

## 2021-07-04 ENCOUNTER — Ambulatory Visit (HOSPITAL_COMMUNITY)
Admission: RE | Admit: 2021-07-04 | Discharge: 2021-07-04 | Disposition: A | Discharge status: Home / Self Care | Payer: PPO | Source: Ambulatory Visit | Attending: Internal Medicine | Admitting: Internal Medicine

## 2021-07-04 ENCOUNTER — Encounter (HOSPITAL_COMMUNITY): Payer: Self-pay | Admitting: Physician Assistant

## 2021-07-04 ENCOUNTER — Encounter (INDEPENDENT_AMBULATORY_CARE_PROVIDER_SITE_OTHER): Payer: PPO | Admitting: Ophthalmology

## 2021-07-04 ENCOUNTER — Other Ambulatory Visit: Payer: Self-pay

## 2021-07-04 ENCOUNTER — Encounter (HOSPITAL_COMMUNITY)
Admission: RE | Disposition: A | Discharge status: Home / Self Care | Payer: PPO | Source: Ambulatory Visit | Attending: Internal Medicine

## 2021-07-04 ENCOUNTER — Ambulatory Visit (HOSPITAL_COMMUNITY): Payer: PPO | Admitting: Anesthesiology

## 2021-07-04 ENCOUNTER — Ambulatory Visit (HOSPITAL_BASED_OUTPATIENT_CLINIC_OR_DEPARTMENT_OTHER)
Admission: RE | Admit: 2021-07-04 | Discharge: 2021-07-04 | Disposition: A | Discharge status: Home / Self Care | Payer: PPO | Source: Ambulatory Visit | Attending: Physician Assistant | Admitting: Physician Assistant

## 2021-07-04 VITALS — BP 132/84 | HR 65 | Ht 68.5 in | Wt 202.0 lb

## 2021-07-04 DIAGNOSIS — Z7901 Long term (current) use of anticoagulants: Secondary | ICD-10-CM | POA: Diagnosis not present

## 2021-07-04 DIAGNOSIS — Z7984 Long term (current) use of oral hypoglycemic drugs: Secondary | ICD-10-CM | POA: Diagnosis not present

## 2021-07-04 DIAGNOSIS — Z87891 Personal history of nicotine dependence: Secondary | ICD-10-CM | POA: Insufficient documentation

## 2021-07-04 DIAGNOSIS — D6869 Other thrombophilia: Secondary | ICD-10-CM | POA: Insufficient documentation

## 2021-07-04 DIAGNOSIS — I4891 Unspecified atrial fibrillation: Secondary | ICD-10-CM | POA: Diagnosis not present

## 2021-07-04 DIAGNOSIS — I4819 Other persistent atrial fibrillation: Secondary | ICD-10-CM | POA: Insufficient documentation

## 2021-07-04 DIAGNOSIS — Z8616 Personal history of COVID-19: Secondary | ICD-10-CM | POA: Diagnosis not present

## 2021-07-04 DIAGNOSIS — G4733 Obstructive sleep apnea (adult) (pediatric): Secondary | ICD-10-CM | POA: Diagnosis not present

## 2021-07-04 DIAGNOSIS — I4892 Unspecified atrial flutter: Secondary | ICD-10-CM | POA: Diagnosis not present

## 2021-07-04 DIAGNOSIS — E669 Obesity, unspecified: Secondary | ICD-10-CM | POA: Diagnosis not present

## 2021-07-04 DIAGNOSIS — Z8673 Personal history of transient ischemic attack (TIA), and cerebral infarction without residual deficits: Secondary | ICD-10-CM | POA: Diagnosis not present

## 2021-07-04 DIAGNOSIS — E119 Type 2 diabetes mellitus without complications: Secondary | ICD-10-CM | POA: Diagnosis not present

## 2021-07-04 DIAGNOSIS — I10 Essential (primary) hypertension: Secondary | ICD-10-CM | POA: Diagnosis not present

## 2021-07-04 DIAGNOSIS — Z6830 Body mass index (BMI) 30.0-30.9, adult: Secondary | ICD-10-CM | POA: Insufficient documentation

## 2021-07-04 DIAGNOSIS — D696 Thrombocytopenia, unspecified: Secondary | ICD-10-CM | POA: Diagnosis not present

## 2021-07-04 DIAGNOSIS — Z8546 Personal history of malignant neoplasm of prostate: Secondary | ICD-10-CM | POA: Insufficient documentation

## 2021-07-04 DIAGNOSIS — I2510 Atherosclerotic heart disease of native coronary artery without angina pectoris: Secondary | ICD-10-CM | POA: Diagnosis not present

## 2021-07-04 DIAGNOSIS — I484 Atypical atrial flutter: Secondary | ICD-10-CM | POA: Diagnosis not present

## 2021-07-04 DIAGNOSIS — E559 Vitamin D deficiency, unspecified: Secondary | ICD-10-CM | POA: Diagnosis not present

## 2021-07-04 DIAGNOSIS — Z9884 Bariatric surgery status: Secondary | ICD-10-CM | POA: Insufficient documentation

## 2021-07-04 HISTORY — PX: CARDIOVERSION: SHX1299

## 2021-07-04 LAB — BASIC METABOLIC PANEL
Anion gap: 9 (ref 5–15)
BUN: 18 mg/dL (ref 8–23)
CO2: 23 mmol/L (ref 22–32)
Calcium: 9 mg/dL (ref 8.9–10.3)
Chloride: 103 mmol/L (ref 98–111)
Creatinine, Ser: 1.11 mg/dL (ref 0.61–1.24)
GFR, Estimated: >60 mL/min (ref >60)
Glucose, Bld: 170 mg/dL — ABNORMAL HIGH (ref 70–99)
Potassium: 4.1 mmol/L (ref 3.5–5.1)
Sodium: 135 mmol/L (ref 135–145)

## 2021-07-04 LAB — CBC
HCT: 44.2 % (ref 39.0–52.0)
Hemoglobin: 14.3 g/dL (ref 13.0–17.0)
MCH: 32.4 pg (ref 26.0–34.0)
MCHC: 32.4 g/dL (ref 30.0–36.0)
MCV: 100.2 fL — ABNORMAL HIGH (ref 80.0–100.0)
Platelets: 127 10*3/uL — ABNORMAL LOW (ref 150–400)
RBC: 4.41 MIL/uL (ref 4.22–5.81)
RDW: 12.6 % (ref 11.5–15.5)
WBC: 4.5 10*3/uL (ref 4.0–10.5)
nRBC: 0 % (ref 0.0–0.2)

## 2021-07-04 LAB — GLUCOSE, CAPILLARY: Glucose-Capillary: 141 mg/dL — ABNORMAL HIGH (ref 70–99)

## 2021-07-04 SURGERY — CARDIOVERSION
Anesthesia: General

## 2021-07-04 MED ORDER — LIDOCAINE 2% (20 MG/ML) 5 ML SYRINGE
INTRAMUSCULAR | Status: DC | PRN
Start: 2021-07-04 — End: 2021-07-04
  Administered 2021-07-04: 40 mg via INTRAVENOUS

## 2021-07-04 MED ORDER — LACTATED RINGERS IV SOLN: INTRAVENOUS | Status: DC | PRN

## 2021-07-04 MED ORDER — SODIUM CHLORIDE 0.9 % IV SOLN: INTRAVENOUS | Status: DC

## 2021-07-04 MED ORDER — PROPOFOL 10 MG/ML IV BOLUS
INTRAVENOUS | Status: DC | PRN
  Administered 2021-07-04: 80 mg via INTRAVENOUS

## 2021-07-04 NOTE — Progress Notes (Signed)
Primary Care Physician: [name], NP Primary Cardiologist: Dr Josefa Half Primary Electrophysiologist: Dr Lovena Le Referring Physician: Dr [facility]   Andrew Cobb is a 70 y.o. male with a history of CAD, DM, OSA, HTN, prior CVA, atrial fibrillation who presents for follow up in the [facility].  The patient was initially diagnosed with atrial fibrillation on ILR 05/07/21 which was implanted for cryptogenic CVA in 09/2018. Patient has a CHADS2VASC score of 6. He was unaware of his arrhythmia. Of note, he was also diagnosed with COVID around the same time. He reports good compliance with his CPAP machine. He denies significant alcohol use.   On follow up today, patient reports he has done well since his last visit. He is rate controlled and unaware of his arrhythmia. He denies any bleeding issues on anticoagulation or any missed doses in the last 3 weeks. He is fasting this morning.   Today, he denies symptoms of palpitations, chest pain, shortness of breath, orthopnea, PND, lower extremity edema, dizziness, presyncope, syncope, snoring, daytime somnolence, bleeding, or neurologic sequela. The patient is tolerating medications without difficulties and is otherwise without complaint today.    Atrial Fibrillation Risk Factors:  he does have symptoms or diagnosis of sleep apnea. he is compliant with CPAP therapy. he does not have a history of rheumatic fever. he does not have a history of alcohol use. The patient does have a history of early familial atrial fibrillation or other arrhythmias. Father and brother have afib.  he has a BMI of Body mass index is 30.27 kg/m.[name] Kitchen Filed Weights   07/04/21 0848  Weight: 91.6 kg     Family History  Problem Relation Age of Onset   Cancer Mother        pancreatic   Diabetes Mother    Stroke Mother    Heart disease Mother    Hyperlipidemia Mother    Hypertension Mother    Heart attack Mother     Heart disease Father    Stroke Father    Diabetes Father    Hypertension Father    Heart attack Father    Hyperlipidemia Father    Pancreatic cancer Father    Cancer Sister    Cancer Brother        lung   Cancer Brother    Kidney cancer Neg Hx    Bladder Cancer Neg Hx    Prostate cancer Neg Hx      Atrial Fibrillation Management history:  Previous antiarrhythmic drugs: none Previous cardioversions: none Previous ablations: none CHADS2VASC score: 6 Anticoagulation history: Eliquis   Past Medical History:  Diagnosis Date   Arthritis    Asthma    has not needed since 6/21   Cancer [facility])    prostate   Chronic venous insufficiency    varicose vein lower extremity with inflammation   Coronary artery disease 1996   two stents placed    Diabetes mellitus without complication ([place])    type 2 on metformin   GERD (gastroesophageal reflux disease)    no issues since gastric bypass surgery as stated per pt   Hyperlipidemia    Hypertension    Hypogonadism in male    MRSA (methicillin resistant Staphylococcus aureus) infection    07/30/2008 thru 08/07/2008  abdominal abcess   Sleep apnea    on BIPAP   Stented coronary artery    Stroke ([place]) 09/07/2019   no defecits   Thrombocythemia    Past Surgical History:  Procedure Laterality Date   ANGIOPLASTY     ANGIOPLASTY     with stent 04/07/1995   ANTERIOR CERVICAL DECOMPRESSION/DISCECTOMY FUSION 4 LEVELS N/A 08/17/2017   Procedure: Anterior discectomy with fusion and plate fixation Cervical Three-Four, Four-Five, Five-Six, and Six-Seven Fusion;  Surgeon: Ditty, Kevan Ny, MD;  Location: [place];  Service: Neurosurgery;  Laterality: N/A;  Anterior discectomy with fusion and plate fixation Cervical Three-Four, Four-Five, Five-Six, and Six-Seven Fusion    APPENDECTOMY     1966   BUBBLE STUDY  09/20/2018   Procedure: BUBBLE STUDY;  Surgeon: Josue Hector, MD;  Location: [facility] ENDOSCOPY;  Service: Cardiovascular;;   [place] TEST     07/31/2011   CARPAL TUNNEL RELEASE Left    CHOLECYSTECTOMY     2006   COLONOSCOPY WITH PROPOFOL N/A 03/26/2021   Procedure: COLONOSCOPY WITH PROPOFOL;  Surgeon: Jonathon Bellows, MD;  Location: [facility] ENDOSCOPY;  Service: Gastroenterology;  Laterality: N/A;   colonscopy      08/25/2012   EYE SURGERY Bilateral    cataract   FUNCTIONAL ENDOSCOPIC SINUS SURGERY     11/10/2013   GASTRIC BYPASS     10/05/2012   HERNIA REPAIR     left inguinal 1981   INCISION AND DRAINAGE ABSCESS Right 02/26/2017   Procedure: INCISION AND DRAINAGE ABSCESS;  Surgeon: Nickie Retort, MD;  Location: [facility] ORS;  Service: Urology;  Laterality: Right;   JOINT REPLACEMENT     bilateral   left ankle surgery      05/03/2003    left carpel tunnel      09/18/1993   left knee meniscal tear      01/25/2010   left knee meniscal tear repair      05/04/1996   left rotator cuff repair      05/03/2003    LOOP RECORDER INSERTION N/A 09/20/2018   Procedure: LOOP RECORDER INSERTION;  Surgeon: Evans Lance, MD;  Location: [place] CV LAB;  Service: Cardiovascular;  Laterality: N/A;   REPLACEMENT TOTAL KNEE BILATERAL  07/13/2015   REVERSE SHOULDER ARTHROPLASTY Left 08/30/2020   Procedure: REVERSE SHOULDER ARTHROPLASTY;  Surgeon: Justice Britain, MD;  Location: [facility]S;  Service: Orthopedics;  Laterality: Left;  136min   right ankle surgery      fracture has 2 screws 07/07/1997   right carpel tunnel      05/16/1992   right shoulder replacement      01/27/2006   SCROTAL EXPLORATION Right 02/26/2017   Procedure: SCROTUM EXPLORATION;  Surgeon: Nickie Retort, MD;  Location: [facility] ORS;  Service: Urology;  Laterality: Right;   TEE WITHOUT CARDIOVERSION N/A 09/20/2018   Procedure: TRANSESOPHAGEAL ECHOCARDIOGRAM (TEE);  Surgeon: Josue Hector, MD;  Location: [facility] ENDOSCOPY;  Service: Cardiovascular;  Laterality: N/A;   TOTAL KNEE ARTHROPLASTY Left 07/13/2015   Procedure: LEFT  TOTAL KNEE ARTHROPLASTY;  Surgeon: Gaynelle Arabian, MD;  Location: [facility];  Service: Orthopedics;  Laterality: Left;    Current Outpatient Medications  Medication Sig Dispense Refill   acyclovir (ZOVIRAX) 400 MG tablet Take 1 tablet (400 mg total) by mouth 2 (two) times daily. 180 tablet 4   amitriptyline (ELAVIL) 25 MG tablet Take 1 tablet (25 mg total) by mouth at bedtime. 90 tablet 4   apixaban (ELIQUIS) 5 MG TABS tablet Take 1 tablet (5 mg total) by mouth 2 (two) times daily. 60 tablet 11   atorvastatin (LIPITOR) 40 MG tablet Take 1 tablet (40 mg total)  by mouth at bedtime. 90 tablet 4   betamethasone dipropionate (DIPROLENE) 0.05 % ointment Apply 1 application topically 2 (two) times daily as needed for rash.     cetirizine (ZYRTEC) 10 MG tablet Take 10 mg by mouth daily.     Cholecalciferol (VITAMIN D) 2000 units CAPS Take 2,000 Units by mouth daily.     clobetasol ointment (TEMOVATE) 6.27 % Apply 1 application topically 2 (two) times daily as needed (rash).     Cyanocobalamin (B-12) 5000 MCG CAPS Take 5,000 mcg by mouth every other day.     empagliflozin (JARDIANCE) 10 MG TABS tablet Take 1 tablet (10 mg total) by mouth daily before breakfast. 90 tablet 3   glucose blood (ONETOUCH ULTRA) test strip 1 each by Other route 2 (two) times daily. Use as instructed 100 each 6   Lancets (ONETOUCH ULTRASOFT) lancets USE 1  TO CHECK GLUCOSE TWICE DAILY 100 each 11   levETIRAcetam (KEPPRA) 500 MG tablet Take 1 tablet (500 mg total) by mouth 2 (two) times daily. 180 tablet 3   metFORMIN (GLUCOPHAGE) 1000 MG tablet Take 1 tablet (1,000 mg total) by mouth 2 (two) times daily with a meal. 180 tablet 4   Multiple Vitamins-Minerals (MULTIVITAMIN PO) Take 1 tablet by mouth daily.      tamsulosin (FLOMAX) 0.4 MG CAPS capsule Take 1 capsule (0.4 mg total) by mouth at bedtime. 90 capsule 4   telmisartan (MICARDIS) 80 MG tablet Take 0.5 tablets (40 mg total) by mouth daily. 45 tablet 4   No current  facility-administered medications for this encounter.    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[employer]   Tobacco Use   Smoking status: Former    Packs/day: 1.00    Years: 10.00    Pack years: 10.00    Types: Cigarettes    Quit date: 07/07/1984    Years since quitting: 37.0   Smokeless tobacco: Never   Tobacco comments:    quit 1986  Vaping Use   Vaping Use: Never used  Substance and Sexual Activity   Alcohol use: No    Alcohol/week: 0.0 standard drinks   Drug use: No   Sexual activity: Yes  Other Topics Concern   Not on file  Social History Narrative   Not on file   Social Determinants of Health   Financial Resource Strain: Low Risk    Difficulty of Paying Living Expenses: Not hard at all  Food Insecurity: No Food Insecurity   Worried About Charity fundraiser in the Last Year: Never true   [place] in the Last Year: Never true  Transportation Needs: No Transportation Needs   Lack of Transportation (Medical): No   Lack of Transportation (Non-Medical): No  Physical Activity: Sufficiently Active   Days of Exercise per Week: 5 days   Minutes of Exercise per Session: 40 min  Stress: No Stress Concern Present   Feeling of Stress : Not at all  Social Connections: Not on file  Intimate Partner Violence: Not on file     ROS- All systems are reviewed and negative except  as per the HPI above.  Physical Exam: Vitals:   07/04/21 0848  BP: 132/84  Pulse: 65  Weight: 91.6 kg  Height: 5' 8.5" (1.74 m)    GEN- The patient is a well appearing obese male, alert and oriented x 3 today.   HEENT-head normocephalic,  atraumatic, sclera clear, conjunctiva pink, hearing intact, trachea midline. Lungs- Clear to ausculation bilaterally, normal work of breathing Heart- Regular rate and rhythm, no murmurs, rubs or gallops  GI- soft, NT, ND, + BS Extremities- no clubbing, cyanosis, or edema MS- no significant deformity or atrophy Skin- no rash or lesion Psych- euthymic mood, full affect Neuro- strength and sensation are intact   Wt Readings from Last 3 Encounters:  07/04/21 91.6 kg  05/16/21 91.2 kg  05/06/21 92.7 kg    EKG today demonstrates  Atypical atrial flutter with 4:1 block Vent. rate 65 BPM PR interval 292 ms QRS duration 90 ms QT/QTcB 368/382 ms  Echo 09/19/18 demonstrated  1. The left ventricle has normal systolic function with an ejection  fraction of 60-65%. The cavity size was normal. There is mild concentric left ventricular hypertrophy. Left ventricular diastolic Doppler parameters are consistent with impaired relaxation.   2. The mitral valve is grossly normal.   3. The tricuspid valve is grossly normal.   4. The aortic valve is tricuspid Aortic valve regurgitation is trivial by  color flow Doppler. Moderate annular calcification seen.   5. The right ventricle has normal systolic function. The cavity was  normal. There is no increase in right ventricular wall thickness.   Epic records are reviewed at length today  CHA2DS2-VASc Score = 6  The patient's score is based upon: CHF History: 0 HTN History: 1 Diabetes History: 1 Stroke History: 2 Vascular Disease History: 1 Age Score: 1 Gender Score: 0       ASSESSMENT AND PLAN: 1. Persistent Atrial Fibrillation/atrial flutter The patient's CHA2DS2-VASc score is 6, indicating a 9.7% annual risk of stroke.   Patient scheduled for DCCV today. Continue Eliquis 5 mg BID, patient denies any missed doses. Check bmet/cbc today.  2. Secondary Hypercoagulable State (ICD10:  D68.69) The patient is at significant risk for  stroke/thromboembolism based upon his CHA2DS2-VASc Score of 6.  Start Apixaban (Eliquis).   3. Obesity Body mass index is 30.27 kg/m. Lifestyle modification was discussed and encouraged including regular physical activity and weight reduction.  4. Obstructive sleep apnea Patient reports compliance with CPAP therapy.  5. CAD No anginal symptoms.  6. HTN Stable, no changes today.   Follow up in the AF clinic post DCCV.    [facility] [address] [place], [place] [address] [phone] 07/04/2021 9:14 AM

## 2021-07-04 NOTE — H&P (View-Only) (Signed)
Primary Care Physician: [name], NP Primary Cardiologist: Dr Josefa Half Primary Electrophysiologist: Dr Lovena Le Referring Physician: Dr [facility]   Andrew Cobb is a 70 y.o. male with a history of CAD, DM, OSA, HTN, prior CVA, atrial fibrillation who presents for follow up in the [facility].  The patient was initially diagnosed with atrial fibrillation on ILR 05/07/21 which was implanted for cryptogenic CVA in 09/2018. Patient has a CHADS2VASC score of 6. He was unaware of his arrhythmia. Of note, he was also diagnosed with COVID around the same time. He reports good compliance with his CPAP machine. He denies significant alcohol use.   On follow up today, patient reports he has done well since his last visit. He is rate controlled and unaware of his arrhythmia. He denies any bleeding issues on anticoagulation or any missed doses in the last 3 weeks. He is fasting this morning.   Today, he denies symptoms of palpitations, chest pain, shortness of breath, orthopnea, PND, lower extremity edema, dizziness, presyncope, syncope, snoring, daytime somnolence, bleeding, or neurologic sequela. The patient is tolerating medications without difficulties and is otherwise without complaint today.    Atrial Fibrillation Risk Factors:  he does have symptoms or diagnosis of sleep apnea. he is compliant with CPAP therapy. he does not have a history of rheumatic fever. he does not have a history of alcohol use. The patient does have a history of early familial atrial fibrillation or other arrhythmias. Father and brother have afib.  he has a BMI of Body mass index is 30.27 kg/m.[name] Kitchen Filed Weights   07/04/21 0848  Weight: 91.6 kg     Family History  Problem Relation Age of Onset   Cancer Mother        pancreatic   Diabetes Mother    Stroke Mother    Heart disease Mother    Hyperlipidemia Mother    Hypertension Mother    Heart attack Mother     Heart disease Father    Stroke Father    Diabetes Father    Hypertension Father    Heart attack Father    Hyperlipidemia Father    Pancreatic cancer Father    Cancer Sister    Cancer Brother        lung   Cancer Brother    Kidney cancer Neg Hx    Bladder Cancer Neg Hx    Prostate cancer Neg Hx      Atrial Fibrillation Management history:  Previous antiarrhythmic drugs: none Previous cardioversions: none Previous ablations: none CHADS2VASC score: 6 Anticoagulation history: Eliquis   Past Medical History:  Diagnosis Date   Arthritis    Asthma    has not needed since 6/21   Cancer [facility])    prostate   Chronic venous insufficiency    varicose vein lower extremity with inflammation   Coronary artery disease 1996   two stents placed    Diabetes mellitus without complication ([place])    type 2 on metformin   GERD (gastroesophageal reflux disease)    no issues since gastric bypass surgery as stated per pt   Hyperlipidemia    Hypertension    Hypogonadism in male    MRSA (methicillin resistant Staphylococcus aureus) infection    07/30/2008 thru 08/07/2008  abdominal abcess   Sleep apnea    on BIPAP   Stented coronary artery    Stroke ([place]) 09/07/2019   no defecits   Thrombocythemia    Past Surgical History:  Procedure Laterality Date   ANGIOPLASTY     ANGIOPLASTY     with stent 04/07/1995   ANTERIOR CERVICAL DECOMPRESSION/DISCECTOMY FUSION 4 LEVELS N/A 08/17/2017   Procedure: Anterior discectomy with fusion and plate fixation Cervical Three-Four, Four-Five, Five-Six, and Six-Seven Fusion;  Surgeon: Ditty, Kevan Ny, MD;  Location: [place];  Service: Neurosurgery;  Laterality: N/A;  Anterior discectomy with fusion and plate fixation Cervical Three-Four, Four-Five, Five-Six, and Six-Seven Fusion    APPENDECTOMY     1966   BUBBLE STUDY  09/20/2018   Procedure: BUBBLE STUDY;  Surgeon: Josue Hector, MD;  Location: [facility] ENDOSCOPY;  Service: Cardiovascular;;   [place] TEST     07/31/2011   CARPAL TUNNEL RELEASE Left    CHOLECYSTECTOMY     2006   COLONOSCOPY WITH PROPOFOL N/A 03/26/2021   Procedure: COLONOSCOPY WITH PROPOFOL;  Surgeon: Jonathon Bellows, MD;  Location[facility]OSCOPY;  Service: Gastroenterology;  Laterality: N/A;   colonscopy      08/25/2012   EYE SURGERY Bilateral    cataract   FUNCTIONAL ENDOSCOPIC SINUS SURGERY     11/10/2013   GASTRIC BYPASS     10/05/2012   HERNIA REPAIR     left inguinal 1981   INCISION AND DRAINAGE ABSCESS Right 02/26/2017   Procedure: INCISION AND DRAINAGE ABSCESS;  Surgeon: Nickie Retort, MD;  Location: [facility] ORS;  Service: Urology;  Laterality: Right;   JOINT REPLACEMENT     bilateral   left ankle surgery      05/03/2003    left carpel tunnel      09/18/1993   left knee meniscal tear      01/25/2010   left knee meniscal tear repair      05/04/1996   left rotator cuff repair      05/03/2003    LOOP RECORDER INSERTION N/A 09/20/2018   Procedure: LOOP RECORDER INSERTION;  Surgeon: Evans Lance, MD;  Location: [place] CV LAB;  Service: Cardiovascular;  Laterality: N/A;   REPLACEMENT TOTAL KNEE BILATERAL  07/13/2015   REVERSE SHOULDER ARTHROPLASTY Left 08/30/2020   Procedure: REVERSE SHOULDER ARTHROPLASTY;  Surgeon: Justice Britain, MD;  Location: [facility];  Service: Orthopedics;  Laterality: Left;  181min   right ankle surgery      fracture has 2 screws 07/07/1997   right carpel tunnel      05/16/1992   right shoulder replacement      01/27/2006   SCROTAL EXPLORATION Right 02/26/2017   Procedure: SCROTUM EXPLORATION;  Surgeon: Nickie Retort, MD;  Location: [facility] ORS;  Service: Urology;  Laterality: Right;   TEE WITHOUT CARDIOVERSION N/A 09/20/2018   Procedure: TRANSESOPHAGEAL ECHOCARDIOGRAM (TEE);  Surgeon: Josue Hector, MD;  Location: [facility] ENDOSCOPY;  Service: Cardiovascular;  Laterality: N/A;   TOTAL KNEE ARTHROPLASTY Left 07/13/2015   Procedure: LEFT  TOTAL KNEE ARTHROPLASTY;  Surgeon: Gaynelle Arabian, MD;  Location: [facility];  Service: Orthopedics;  Laterality: Left;    Current Outpatient Medications  Medication Sig Dispense Refill   acyclovir (ZOVIRAX) 400 MG tablet Take 1 tablet (400 mg total) by mouth 2 (two) times daily. 180 tablet 4   amitriptyline (ELAVIL) 25 MG tablet Take 1 tablet (25 mg total) by mouth at bedtime. 90 tablet 4   apixaban (ELIQUIS) 5 MG TABS tablet Take 1 tablet (5 mg total) by mouth 2 (two) times daily. 60 tablet 11   atorvastatin (LIPITOR) 40 MG tablet Take 1 tablet (40 mg total)  by mouth at bedtime. 90 tablet 4   betamethasone dipropionate (DIPROLENE) 0.05 % ointment Apply 1 application topically 2 (two) times daily as needed for rash.     cetirizine (ZYRTEC) 10 MG tablet Take 10 mg by mouth daily.     Cholecalciferol (VITAMIN D) 2000 units CAPS Take 2,000 Units by mouth daily.     clobetasol ointment (TEMOVATE) 5.36 % Apply 1 application topically 2 (two) times daily as needed (rash).     Cyanocobalamin (B-12) 5000 MCG CAPS Take 5,000 mcg by mouth every other day.     empagliflozin (JARDIANCE) 10 MG TABS tablet Take 1 tablet (10 mg total) by mouth daily before breakfast. 90 tablet 3   glucose blood (ONETOUCH ULTRA) test strip 1 each by Other route 2 (two) times daily. Use as instructed 100 each 6   Lancets (ONETOUCH ULTRASOFT) lancets USE 1  TO CHECK GLUCOSE TWICE DAILY 100 each 11   levETIRAcetam (KEPPRA) 500 MG tablet Take 1 tablet (500 mg total) by mouth 2 (two) times daily. 180 tablet 3   metFORMIN (GLUCOPHAGE) 1000 MG tablet Take 1 tablet (1,000 mg total) by mouth 2 (two) times daily with a meal. 180 tablet 4   Multiple Vitamins-Minerals (MULTIVITAMIN PO) Take 1 tablet by mouth daily.      tamsulosin (FLOMAX) 0.4 MG CAPS capsule Take 1 capsule (0.4 mg total) by mouth at bedtime. 90 capsule 4   telmisartan (MICARDIS) 80 MG tablet Take 0.5 tablets (40 mg total) by mouth daily. 45 tablet 4   No current  facility-administered medications for this encounter.    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[employer]   Tobacco Use   Smoking status: Former    Packs/day: 1.00    Years: 10.00    Pack years: 10.00    Types: Cigarettes    Quit date: 07/07/1984    Years since quitting: 37.0   Smokeless tobacco: Never   Tobacco comments:    quit 1986  Vaping Use   Vaping Use: Never used  Substance and Sexual Activity   Alcohol use: No    Alcohol/week: 0.0 standard drinks   Drug use: No   Sexual activity: Yes  Other Topics Concern   Not on file  Social History Narrative   Not on file   Social Determinants of Health   Financial Resource Strain: Low Risk    Difficulty of Paying Living Expenses: Not hard at all  Food Insecurity: No Food Insecurity   Worried About Charity fundraiser in the Last Year: Never true   [place] in the Last Year: Never true  Transportation Needs: No Transportation Needs   Lack of Transportation (Medical): No   Lack of Transportation (Non-Medical): No  Physical Activity: Sufficiently Active   Days of Exercise per Week: 5 days   Minutes of Exercise per Session: 40 min  Stress: No Stress Concern Present   Feeling of Stress : Not at all  Social Connections: Not on file  Intimate Partner Violence: Not on file     ROS- All systems are reviewed and negative except  as per the HPI above.  Physical Exam: Vitals:   07/04/21 0848  BP: 132/84  Pulse: 65  Weight: 91.6 kg  Height: 5' 8.5" (1.74 m)    GEN- The patient is a well appearing obese male, alert and oriented x 3 today.   HEENT-head normocephalic,  atraumatic, sclera clear, conjunctiva pink, hearing intact, trachea midline. Lungs- Clear to ausculation bilaterally, normal work of breathing Heart- Regular rate and rhythm, no murmurs, rubs or gallops  GI- soft, NT, ND, + BS Extremities- no clubbing, cyanosis, or edema MS- no significant deformity or atrophy Skin- no rash or lesion Psych- euthymic mood, full affect Neuro- strength and sensation are intact   Wt Readings from Last 3 Encounters:  07/04/21 91.6 kg  05/16/21 91.2 kg  05/06/21 92.7 kg    EKG today demonstrates  Atypical atrial flutter with 4:1 block Vent. rate 65 BPM PR interval 292 ms QRS duration 90 ms QT/QTcB 368/382 ms  Echo 09/19/18 demonstrated  1. The left ventricle has normal systolic function with an ejection  fraction of 60-65%. The cavity size was normal. There is mild concentric left ventricular hypertrophy. Left ventricular diastolic Doppler parameters are consistent with impaired relaxation.   2. The mitral valve is grossly normal.   3. The tricuspid valve is grossly normal.   4. The aortic valve is tricuspid Aortic valve regurgitation is trivial by  color flow Doppler. Moderate annular calcification seen.   5. The right ventricle has normal systolic function. The cavity was  normal. There is no increase in right ventricular wall thickness.   Epic records are reviewed at length today  CHA2DS2-VASc Score = 6  The patient's score is based upon: CHF History: 0 HTN History: 1 Diabetes History: 1 Stroke History: 2 Vascular Disease History: 1 Age Score: 1 Gender Score: 0       ASSESSMENT AND PLAN: 1. Persistent Atrial Fibrillation/atrial flutter The patient's CHA2DS2-VASc score is 6, indicating a 9.7% annual risk of stroke.   Patient scheduled for DCCV today. Continue Eliquis 5 mg BID, patient denies any missed doses. Check bmet/cbc today.  2. Secondary Hypercoagulable State (ICD10:  D68.69) The patient is at significant risk for  stroke/thromboembolism based upon his CHA2DS2-VASc Score of 6.  Start Apixaban (Eliquis).   3. Obesity Body mass index is 30.27 kg/m. Lifestyle modification was discussed and encouraged including regular physical activity and weight reduction.  4. Obstructive sleep apnea Patient reports compliance with CPAP therapy.  5. CAD No anginal symptoms.  6. HTN Stable, no changes today.   Follow up in the AF clinic post DCCV.    [facility] [address] [place], [place] [address] [phone] 07/04/2021 9:14 AM

## 2021-07-04 NOTE — Anesthesia Preprocedure Evaluation (Addendum)
Anesthesia Evaluation  Patient identified by MRN, date of birth, ID band Patient awake    Reviewed: Allergy & Precautions, NPO status , Patient's Chart, lab work & pertinent test results  History of Anesthesia Complications Negative for: history of anesthetic complications  Airway Mallampati: I  TM Distance: >3 FB Neck ROM: Full    Dental  (+) Partial Upper, Dental Advisory Given, Missing   Pulmonary sleep apnea (BiPAP) , former smoker,    Pulmonary exam normal breath sounds clear to auscultation       Cardiovascular hypertension, Pt. on medications (-) angina+ CAD and + Cardiac Stents   Rhythm:Irregular Rate:Normal  '20 ECHO: normal systolic function, with EF 60-65%, The right ventricle has normal systolic function, mild MR, mild TR   Neuro/Psych CVA, No Residual Symptoms    GI/Hepatic Neg liver ROS, GERD  Controlled,S/p gastric bypass   Endo/Other  diabetes (glu 141), Oral Hypoglycemic Agents  Renal/GU negative Renal ROS   Prostate cancer    Musculoskeletal   Abdominal (+) + obese,   Peds  Hematology eliquis   Anesthesia Other Findings   Reproductive/Obstetrics                            Anesthesia Physical Anesthesia Plan  ASA: 3  Anesthesia Plan: General   Post-op Pain Management: Minimal or no pain anticipated   Induction:   PONV Risk Score and Plan: 2 and Propofol infusion, TIVA and Treatment may vary due to age or medical condition  Airway Management Planned: Natural Airway and Mask  Additional Equipment: None  Intra-op Plan:   Post-operative Plan:   Informed Consent: I have reviewed the patients History and Physical, chart, labs and discussed the procedure including the risks, benefits and alternatives for the proposed anesthesia with the patient or authorized representative who has indicated his/her understanding and acceptance.     Dental advisory given  Plan  Discussed with: CRNA and Surgeon  Anesthesia Plan Comments:        Anesthesia Quick Evaluation

## 2021-07-04 NOTE — Interval H&P Note (Signed)
History and Physical Interval Note:  07/04/2021 10:35 AM  Andrew Cobb  has presented today for surgery, with the diagnosis of AFIB.  The various methods of treatment have been discussed with the patient and family. After consideration of risks, benefits and other options for treatment, the patient has consented to  Procedure(s): CARDIOVERSION (N/A) as a surgical intervention.  The patient's history has been reviewed, patient examined, no change in status, stable for surgery.  I have reviewed the patient's chart and labs.  Questions were answered to the patient's satisfaction.     [name]

## 2021-07-04 NOTE — CV Procedure (Signed)
° °  CARDIOVERSION NOTE  Procedure: Electrical Cardioversion Indications:  Atrial Flutter  Procedure Details:  Consent: Risks of procedure as well as the alternatives and risks of each were explained to the (patient/caregiver).  Consent for procedure obtained.  Time Out: Verified patient identification, verified procedure, site/side was marked, verified correct patient position, special equipment/implants available, medications/allergies/relevent history reviewed, required imaging and test results available.  Performed  Patient placed on cardiac monitor, pulse oximetry, supplemental oxygen as necessary.  Sedation given:  propofol per anesthesia Pacer pads placed anterior and posterior chest.  Cardioverted 1 time(s).  Cardioverted at 120J biphasic.  Impression: Findings: Post procedure EKG shows: NSR Complications: None Patient did tolerate procedure well.  Plan: Successful DCCV to NSR with a single 120J biphasic shock.  Time Spent Directly with the Patient:  30 minutes   [name], MD, [facility], [place] Director of the [facility]  [facility] Diplomate of the American Board of Clinical Lipidology Attending Cardiologist  Direct Dial: [phone]   Fax: [phone]  Website:  [email or web address] 07/04/2021, 11:31 AM

## 2021-07-04 NOTE — Anesthesia Postprocedure Evaluation (Signed)
Anesthesia Post Note  Patient: Andrew Cobb  Procedure(s) Performed: CARDIOVERSION     Patient location during evaluation: Endoscopy Anesthesia Type: General Level of consciousness: awake and alert, patient cooperative and oriented Pain management: pain level controlled Vital Signs Assessment: post-procedure vital signs reviewed and stable Respiratory status: nonlabored ventilation, spontaneous breathing and respiratory function stable Cardiovascular status: blood pressure returned to baseline and stable Postop Assessment: no apparent nausea or vomiting and able to ambulate Anesthetic complications: no   No notable events documented.  Last Vitals:  Vitals:   07/04/21 1153 07/04/21 1200  BP: (!) 154/82 (!) 145/86  Pulse: 76 69  Resp: 17 (!) 21  Temp:    SpO2: 100% 100%    Last Pain:  Vitals:   07/04/21 1153  TempSrc:   PainSc: 0-No pain                 Alcie Runions,E. Kennedey Digilio

## 2021-07-04 NOTE — Transfer of Care (Signed)
Immediate Anesthesia Transfer of Care Note  Patient: Andrew Cobb  Procedure(s) Performed: CARDIOVERSION  Patient Location: Endoscopy Unit  Anesthesia Type:General  Level of Consciousness: drowsy and patient cooperative  Airway & Oxygen Therapy: Patient Spontanous Breathing  Post-op Assessment: Report given to RN and Post -op Vital signs reviewed and stable  Post vital signs: Reviewed and stable  Last Vitals:  Vitals Value Taken Time  BP 126/76   Temp    Pulse 81   Resp 22   SpO2 96     Last Pain:  Vitals:   07/04/21 1002  TempSrc: Oral  PainSc: 0-No pain         Complications: No notable events documented.

## 2021-07-05 ENCOUNTER — Other Ambulatory Visit: Payer: Self-pay

## 2021-07-05 ENCOUNTER — Inpatient Hospital Stay: Payer: PPO | Attending: Radiation Oncology

## 2021-07-05 ENCOUNTER — Ambulatory Visit (INDEPENDENT_AMBULATORY_CARE_PROVIDER_SITE_OTHER): Payer: PPO | Admitting: Nurse Practitioner

## 2021-07-05 ENCOUNTER — Encounter: Payer: Self-pay | Admitting: Nurse Practitioner

## 2021-07-05 VITALS — BP 119/73 | HR 65 | Temp 97.5°F | Ht 68.75 in | Wt 204.0 lb

## 2021-07-05 DIAGNOSIS — E1169 Type 2 diabetes mellitus with other specified complication: Secondary | ICD-10-CM | POA: Diagnosis not present

## 2021-07-05 DIAGNOSIS — I152 Hypertension secondary to endocrine disorders: Secondary | ICD-10-CM | POA: Diagnosis not present

## 2021-07-05 DIAGNOSIS — E785 Hyperlipidemia, unspecified: Secondary | ICD-10-CM | POA: Diagnosis not present

## 2021-07-05 DIAGNOSIS — D649 Anemia, unspecified: Secondary | ICD-10-CM | POA: Diagnosis not present

## 2021-07-05 DIAGNOSIS — E1165 Type 2 diabetes mellitus with hyperglycemia: Secondary | ICD-10-CM | POA: Diagnosis not present

## 2021-07-05 DIAGNOSIS — I700 Atherosclerosis of aorta: Secondary | ICD-10-CM | POA: Diagnosis not present

## 2021-07-05 DIAGNOSIS — C61 Malignant neoplasm of prostate: Secondary | ICD-10-CM | POA: Insufficient documentation

## 2021-07-05 DIAGNOSIS — I2510 Atherosclerotic heart disease of native coronary artery without angina pectoris: Secondary | ICD-10-CM

## 2021-07-05 DIAGNOSIS — D696 Thrombocytopenia, unspecified: Secondary | ICD-10-CM | POA: Diagnosis not present

## 2021-07-05 DIAGNOSIS — Z8546 Personal history of malignant neoplasm of prostate: Secondary | ICD-10-CM

## 2021-07-05 DIAGNOSIS — G4733 Obstructive sleep apnea (adult) (pediatric): Secondary | ICD-10-CM

## 2021-07-05 DIAGNOSIS — D6869 Other thrombophilia: Secondary | ICD-10-CM | POA: Diagnosis not present

## 2021-07-05 DIAGNOSIS — I4819 Other persistent atrial fibrillation: Secondary | ICD-10-CM | POA: Diagnosis not present

## 2021-07-05 DIAGNOSIS — R569 Unspecified convulsions: Secondary | ICD-10-CM | POA: Diagnosis not present

## 2021-07-05 DIAGNOSIS — Z923 Personal history of irradiation: Secondary | ICD-10-CM | POA: Diagnosis not present

## 2021-07-05 DIAGNOSIS — H353211 Exudative age-related macular degeneration, right eye, with active choroidal neovascularization: Secondary | ICD-10-CM

## 2021-07-05 DIAGNOSIS — Z8673 Personal history of transient ischemic attack (TIA), and cerebral infarction without residual deficits: Secondary | ICD-10-CM

## 2021-07-05 DIAGNOSIS — E1159 Type 2 diabetes mellitus with other circulatory complications: Secondary | ICD-10-CM

## 2021-07-05 LAB — PSA: Prostatic Specific Antigen: 0.5 ng/mL (ref 0.00–4.00)

## 2021-07-05 LAB — BAYER DCA HB A1C WAIVED: HB A1C (BAYER DCA - WAIVED): 6.9 % — ABNORMAL HIGH (ref 4.8–5.6)

## 2021-07-05 MED ORDER — FLUTICASONE PROPIONATE 50 MCG/ACT NA SUSP: 2.0000 | Freq: Every day | NASAL | 6 refills | Status: DC

## 2021-07-05 MED ORDER — LEVETIRACETAM 500 MG PO TABS: 500.0000 mg | ORAL_TABLET | Freq: Two times a day (BID) | ORAL | 3 refills | Status: DC

## 2021-07-05 MED ORDER — ALBUTEROL SULFATE HFA 108 (90 BASE) MCG/ACT IN AERS: 2.0000 | INHALATION_SPRAY | Freq: Four times a day (QID) | RESPIRATORY_TRACT | 4 refills | Status: DC | PRN

## 2021-07-05 NOTE — Assessment & Plan Note (Signed)
Chronic, ongoing with BP below neuro goal <130/90.  Continue current medication regimen and adjust as needed.  Recommend he monitor BP at home daily and document.  Continue focus on DASH diet and regular exercise at home.  CBC and CMP today.  Recommend he wear compression hose at home to avoid syncopal episodes and orthostatic BP.  Return in 3 months.

## 2021-07-05 NOTE — Assessment & Plan Note (Signed)
Noted on imaging 07/11/20 and previous imaging.  Recommend continue daily statin therapy and ASA for prevention. 

## 2021-07-05 NOTE — Assessment & Plan Note (Signed)
Chronic, stable with cardioversion on 07/04/21.  Continue collaboration with cardiology.

## 2021-07-05 NOTE — Assessment & Plan Note (Signed)
Noted on past labs, check CBC today. 

## 2021-07-05 NOTE — Assessment & Plan Note (Signed)
Continue ASA and maintaining tight lipid control, may need to increase Atorvastatin to 80 MG.  Lipid panel today.

## 2021-07-05 NOTE — Assessment & Plan Note (Signed)
Being followed by neurology.  Continue current Keppra dose as prescribed by them and collaboration with neurology, reviewed notes.  Level next visit.

## 2021-07-05 NOTE — Assessment & Plan Note (Signed)
Chronic, ongoing.  Continue ASA and maintaining tight lipid control, may need to increase Atorvastatin to 80 MG if ever elevation, recent LDL in 40's.  Continue current Atorvastatin dosing.  Lipid check today.  Refills sent in as needed.

## 2021-07-05 NOTE — Patient Instructions (Signed)

## 2021-07-05 NOTE — Assessment & Plan Note (Signed)
A-Fib and Eliquis.  Monitor closely.  CBC today.

## 2021-07-05 NOTE — Assessment & Plan Note (Signed)
Chronic, ongoing with A1C in today 6.9%, plus urine ALB 30 and A:C <30 recent check.  Continue Telmisartan for kidney protection. Neuro goal <6.5%, recommend he take medication daily and focus heavily on diet.  Continue  Metformin 1000 MG BID and continue Jardiance 10 MG - obtains via assistance.  May need to increase next visit if elevation above goal ongoing.  Recommend he check BS twice daily + continue focus on regular exercise and diet.  Return in 3 months for A1C check.

## 2021-07-05 NOTE — Assessment & Plan Note (Signed)
Chronic, ongoing, followed by opthalmology.  Continue this collaboration. 

## 2021-07-05 NOTE — Assessment & Plan Note (Signed)
Currently no treatment, continue collaboration with oncology and urology.  Recent notes reviewed. 

## 2021-07-05 NOTE — Assessment & Plan Note (Signed)
Continue collaboration with neurology due to significant stroke history.  Continue current medication regimen. 

## 2021-07-05 NOTE — Progress Notes (Signed)
BP 119/73    Pulse 65    Temp (!) 97.5 F (36.4 C) (Oral)    Ht 5' 8.75" (1.746 m)    Wt 204 lb (92.5 kg)    SpO2 97%    BMI 30.35 kg/m    Subjective:    Patient ID: Andrew Cobb, male    DOB: 07/06/1951, 70 y.o.   MRN: [id number]  HPI: Andrew Cobb is a 70 y.o. male  Chief Complaint  Patient presents with   Medication Refill    Patient is requesting refills on medications (Flonase, Keppra, and Albuterol Inhaler).    Diabetes   Hypertension   Hyperlipidemia   Anemia   DIABETES Last A1C in October 6.9%.  He continues on Metformin 1000 MG BID and Jardiance 10 MG -- gets assistance with Jardiance. Has history of bariatric surgery > 8 years ago.  He continues to do cardio during the week.   Followed by ophthalmology for macular degeneration and gets injections -- saw last 04/18/21. Hypoglycemic episodes:no Polydipsia/polyuria: no Visual disturbance: no Chest pain: no Paresthesias: no Glucose Monitoring: yes             Accucheck frequency: Daily             Fasting glucose: 115 to 125             Post prandial:             Evening:             Before meals: Taking Insulin?: no             Long acting insulin:             Short acting insulin: Blood Pressure Monitoring: daily Retinal Examination: Up to Date Foot Exam: Up to Date Pneumovax: Up To Date Influenza: Up to Date Aspirin: yes    ANEMIA Noticed on past labs and monitoring.  Had colonoscopy in September. Anemia status: stable Etiology of anemia: Duration of anemia treatment:  Compliance with treatment: good compliance Iron supplementation side effects: no Severity of anemia: mild Fatigue: no Decreased exercise tolerance: no  Dyspnea on exertion: no Palpitations: no Bleeding: no Pica: no    HYPERTENSION / HYPERLIPIDEMIA Continues Telmisartan 40 MG and Atorvastatin 40 MG daily.  Was noted to have aortic atherosclerosis on imaging 07/11/20.  Had his first cardioversion on 07/04/21 -- tolerated well.      Continues to use CPAP nightly, is awaiting on new machine, supplies are behind -- still on back order.   Followed by neurology due to history of stroke events x 3. Has loop recorder in place.  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  Saw cardiology last 05/16/21. Satisfied with current treatment? yes Duration of hypertension: chronic BP monitoring frequency: daily BP range: 110 -115/70 BP medication side effects: no Duration of hyperlipidemia: chronic Cholesterol medication side effects: no Cholesterol supplements: none Medication compliance: good compliance Aspirin: yes Recent stressors: no Recurrent headaches: no Visual changes: no Palpitations: no Dyspnea: yes Chest pain: no Lower extremity edema: no Dizzy/lightheaded: no    HISTORY OF PROSTATE CA: Followed by Dr. Bernardo Heater in past.  Last seen 11/24/2019 and went through IMRT for 40 days, reports he is tolerated this well.  Last treatment was on 02/02/2020.  Saw Dr. Baruch Gouty last 12/06/20, oncology and returns next week - he checks PSA.   SEIZURES: Seen by neurology on 12/26/20, returns to see them next week, and recommendation to continue Keppra 500 MG  BID + ASA and Atorvastatin.  No recent seizure activity. Does exercise frequently and stays active.  Continues to have loop recorder in place.  He continues on Amitriptyline to help him sleep at night.  Relevant past medical, surgical, family and social history reviewed and updated as indicated. Interim medical history since our last visit reviewed. Allergies and medications reviewed and updated.  Review of Systems  Constitutional:  Negative for activity change, diaphoresis, fatigue and fever.  Respiratory:  Negative for cough, chest tightness, shortness of breath and wheezing.   Cardiovascular:  Negative for chest pain, palpitations and leg swelling.  Gastrointestinal: Negative.   Endocrine: Negative for polydipsia, polyphagia and polyuria.  Neurological: Negative.    Psychiatric/Behavioral: Negative.     Per HPI unless specifically indicated above     Objective:    BP 119/73    Pulse 65    Temp (!) 97.5 F (36.4 C) (Oral)    Ht 5' 8.75" (1.746 m)    Wt 204 lb (92.5 kg)    SpO2 97%    BMI 30.35 kg/m   Wt Readings from Last 3 Encounters:  07/05/21 204 lb (92.5 kg)  07/04/21 201 lb 15.1 oz (91.6 kg)  07/04/21 202 lb (91.6 kg)    Physical Exam Vitals and nursing note reviewed.  Constitutional:      General: He is awake. He is not in acute distress.    Appearance: He is well-developed and well-groomed. He is not ill-appearing.  HENT:     Head: Normocephalic and atraumatic.     Right Ear: Hearing normal. No drainage.     Left Ear: Hearing normal. No drainage.     Nose:     Comments: Mask in place. Eyes:     General: Lids are normal.        Right eye: No discharge.        Left eye: No discharge.     Conjunctiva/sclera: Conjunctivae normal.     Pupils: Pupils are equal, round, and reactive to light.  Neck:     Thyroid: No thyromegaly.     Vascular: No carotid bruit or JVD.     Trachea: Trachea normal.  Cardiovascular:     Rate and Rhythm: Normal rate and regular rhythm.     Heart sounds: Normal heart sounds, S1 normal and S2 normal. No murmur heard.   No gallop.  Pulmonary:     Effort: Pulmonary effort is normal. No accessory muscle usage or respiratory distress.     Breath sounds: Normal breath sounds.  Abdominal:     General: Bowel sounds are normal.     Palpations: Abdomen is soft. There is no hepatomegaly or splenomegaly.  Musculoskeletal:        General: Normal range of motion.     Cervical back: Normal range of motion and neck supple.     Right lower leg: No edema.     Left lower leg: No edema.  Skin:    General: Skin is warm and dry.     Capillary Refill: Capillary refill takes less than 2 seconds.  Neurological:     Mental Status: He is alert and oriented to person, place, and time.     Deep Tendon Reflexes: Reflexes are  normal and symmetric.  Psychiatric:        Attention and Perception: Attention normal.        Mood and Affect: Mood normal.        Speech: Speech normal.  Behavior: Behavior normal. Behavior is cooperative.        Thought Content: Thought content normal.    Results for orders placed or performed in visit on 07/05/21  PSA  Result Value Ref Range   Prostatic Specific Antigen 0.50 0.00 - 4.00 ng/mL      Assessment & Plan:   Problem List Items Addressed This Visit       Cardiovascular and Mediastinum   Aortic atherosclerosis ([place])    Noted on imaging 07/11/20 and previous imaging.  Recommend continue daily statin therapy and ASA for prevention.      Coronary artery disease    Continue ASA and maintaining tight lipid control, may need to increase Atorvastatin to 80 MG.  Lipid panel today.      Exudative age-related macular degeneration of right eye with active choroidal neovascularization (HCC)    Chronic, ongoing, followed by opthalmology.  Continue this collaboration.      Hypertension associated with diabetes ([place])    Chronic, ongoing with BP below neuro goal <130/90.  Continue current medication regimen and adjust as needed.  Recommend he monitor BP at home daily and document.  Continue focus on DASH diet and regular exercise at home.  CBC and CMP today.  Recommend he wear compression hose at home to avoid syncopal episodes and orthostatic BP.  Return in 3 months.      Relevant Orders   Bayer DCA Hb A1c Waived   Persistent atrial fibrillation (HCC)    Chronic, stable with cardioversion on 07/04/21.  Continue collaboration with cardiology.        Respiratory   OSA (obstructive sleep apnea)    Continue 100% adherence to CPAP use.  He is awaiting new supplies.        Endocrine   Hyperlipidemia associated with type 2 diabetes mellitus (HCC)    Chronic, ongoing.  Continue ASA and maintaining tight lipid control, may need to increase Atorvastatin to 80 MG if ever  elevation, recent LDL in 40's.  Continue current Atorvastatin dosing.  Lipid check today.  Refills sent in as needed.      Relevant Orders   Bayer DCA Hb A1c Waived   Comprehensive metabolic panel   Lipid Panel w/o Chol/HDL Ratio   Uncontrolled type 2 diabetes mellitus with hyperglycemia, without long-term current use of insulin (HCC) - Primary    Chronic, ongoing with A1C in today 6.9%, plus urine ALB 30 and A:C <30 recent check.  Continue Telmisartan for kidney protection. Neuro goal <6.5%, recommend he take medication daily and focus heavily on diet.  Continue  Metformin 1000 MG BID and continue Jardiance 10 MG - obtains via assistance.  May need to increase next visit if elevation above goal ongoing.  Recommend he check BS twice daily + continue focus on regular exercise and diet.  Return in 3 months for A1C check.      Relevant Orders   Bayer DCA Hb A1c Waived     Hematopoietic and Hemostatic   Thrombocytopenia ([place])    Noted on past labs, check CBC today.      Relevant Orders   CBC with Differential/Platelet     Other   History of prostate cancer    Currently no treatment, continue collaboration with oncology and urology.  Recent notes reviewed.      History of stroke    Continue collaboration with neurology due to significant stroke history.  Continue current medication regimen.      Low hemoglobin  Recent colonoscopy reassuring.  Recent labs showed improvement, recheck CBC today and iron/ferritin today.      Relevant Orders   CBC with Differential/Platelet   Iron Binding Cap (TIBC)([facility])   Ferritin   Secondary hypercoagulable state (HCC)    A-Fib and Eliquis.  Monitor closely.  CBC today.      Seizures ([place])    Being followed by neurology.  Continue current Keppra dose as prescribed by them and collaboration with neurology, reviewed notes.  Level next visit.      Relevant Medications   levETIRAcetam (KEPPRA) 500 MG tablet     Follow up  plan: Return in about 3 months (around 10/03/2021) for T2DM, HTN/HLD, SEIZURES.

## 2021-07-05 NOTE — Assessment & Plan Note (Signed)
Recent colonoscopy reassuring.  Recent labs showed improvement, recheck CBC today and iron/ferritin today.

## 2021-07-05 NOTE — Assessment & Plan Note (Signed)
Continue 100% adherence to CPAP use.  He is awaiting new supplies.

## 2021-07-06 LAB — LIPID PANEL W/O CHOL/HDL RATIO
Cholesterol, Total: 103 mg/dL (ref 100–199)
HDL: 49 mg/dL (ref >39)
LDL Chol Calc (NIH): 31 mg/dL (ref 0–99)
Triglycerides: 132 mg/dL (ref 0–149)
VLDL Cholesterol Cal: 23 mg/dL (ref 5–40)

## 2021-07-06 LAB — CBC WITH DIFFERENTIAL/PLATELET
Basophils Absolute: 0 10*3/uL (ref 0.0–0.2)
Basos: 1 %
EOS (ABSOLUTE): 0.1 10*3/uL (ref 0.0–0.4)
Eos: 2 %
Hematocrit: 40.3 % (ref 37.5–51.0)
Hemoglobin: 13.3 g/dL (ref 13.0–17.7)
Immature Grans (Abs): 0 10*3/uL (ref 0.0–0.1)
Immature Granulocytes: 0 %
Lymphocytes Absolute: 0.6 10*3/uL — ABNORMAL LOW (ref 0.7–3.1)
Lymphs: 14 %
MCH: 32.1 pg (ref 26.6–33.0)
MCHC: 33 g/dL (ref 31.5–35.7)
MCV: 97 fL (ref 79–97)
Monocytes Absolute: 0.5 10*3/uL (ref 0.1–0.9)
Monocytes: 11 %
Neutrophils Absolute: 3 10*3/uL (ref 1.4–7.0)
Neutrophils: 72 %
Platelets: 131 10*3/uL — ABNORMAL LOW (ref 150–450)
RBC: 4.14 x10E6/uL (ref 4.14–5.80)
RDW: 11.8 % (ref 11.6–15.4)
WBC: 4.1 10*3/uL (ref 3.4–10.8)

## 2021-07-06 LAB — COMPREHENSIVE METABOLIC PANEL
ALT: 27 IU/L (ref 0–44)
AST: 25 IU/L (ref 0–40)
Albumin/Globulin Ratio: 2.5 — ABNORMAL HIGH (ref 1.2–2.2)
Albumin: 4.3 g/dL (ref 3.8–4.8)
Alkaline Phosphatase: 100 IU/L (ref 44–121)
BUN/Creatinine Ratio: 16 (ref 10–24)
BUN: 20 mg/dL (ref 8–27)
Bilirubin Total: 0.6 mg/dL (ref 0.0–1.2)
CO2: 22 mmol/L (ref 20–29)
Calcium: 9 mg/dL (ref 8.6–10.2)
Chloride: 101 mmol/L (ref 96–106)
Creatinine, Ser: 1.27 mg/dL (ref 0.76–1.27)
Globulin, Total: 1.7 g/dL (ref 1.5–4.5)
Glucose: 274 mg/dL — ABNORMAL HIGH (ref 70–99)
Potassium: 4.8 mmol/L (ref 3.5–5.2)
Sodium: 139 mmol/L (ref 134–144)
Total Protein: 6 g/dL (ref 6.0–8.5)
eGFR: 61 mL/min/{1.73_m2} (ref >59)

## 2021-07-06 LAB — IRON AND TIBC
Iron Saturation: 21 % (ref 15–55)
Iron: 65 ug/dL (ref 38–169)
Total Iron Binding Capacity: 314 ug/dL (ref 250–450)
UIBC: 249 ug/dL (ref 111–343)

## 2021-07-06 LAB — FERRITIN: Ferritin: 57 ng/mL (ref 30–400)

## 2021-07-06 NOTE — Progress Notes (Signed)
Contacted via [place] morning [place], your labs have returned: - CMP continues to show normal kidney function, creatinine and eGFR, + normal liver function, AST and ALT. - Lipid panel is normal -- continue medication. - CBC shows no anemia or infection, but platelet count remains on lower side.  We will continue to closely monitor this, as these help your blood clot.   - Iron and ferritin levels normal.  Keep up the great work.  Happy New Year to your family and you!! Keep being amazing!!  Thank you for allowing me to participate in your care.  I appreciate you. Kindest regards, Jilberto Vanderwall

## 2021-07-07 ENCOUNTER — Encounter (HOSPITAL_COMMUNITY): Payer: Self-pay | Admitting: Internal Medicine

## 2021-07-09 ENCOUNTER — Other Ambulatory Visit: Payer: Self-pay | Admitting: Nurse Practitioner

## 2021-07-09 NOTE — Telephone Encounter (Signed)
[facility] calling to ask you change the  albuterol (VENTOLIN HFA) 108 (90 Base) MCG/ACT inhaler  fluticasone (FLONASE) 50 MCG/ACT nasal spray  To a 90 day supply If you have any questions, call: Technician line  [phone] Ref number [id number]

## 2021-07-10 ENCOUNTER — Ambulatory Visit: Payer: PPO | Admitting: Radiation Oncology

## 2021-07-10 ENCOUNTER — Telehealth: Payer: Self-pay | Admitting: Adult Health

## 2021-07-10 ENCOUNTER — Other Ambulatory Visit: Payer: Self-pay

## 2021-07-10 ENCOUNTER — Encounter: Payer: Self-pay | Admitting: Adult Health

## 2021-07-10 ENCOUNTER — Ambulatory Visit: Payer: PPO | Admitting: Adult Health

## 2021-07-10 VITALS — BP 127/76 | HR 74 | Ht 69.0 in | Wt 203.0 lb

## 2021-07-10 DIAGNOSIS — R569 Unspecified convulsions: Secondary | ICD-10-CM

## 2021-07-10 DIAGNOSIS — Z9989 Dependence on other enabling machines and devices: Secondary | ICD-10-CM | POA: Diagnosis not present

## 2021-07-10 DIAGNOSIS — I639 Cerebral infarction, unspecified: Secondary | ICD-10-CM

## 2021-07-10 DIAGNOSIS — G4733 Obstructive sleep apnea (adult) (pediatric): Secondary | ICD-10-CM

## 2021-07-10 DIAGNOSIS — I69398 Other sequelae of cerebral infarction: Secondary | ICD-10-CM | POA: Diagnosis not present

## 2021-07-10 NOTE — Telephone Encounter (Signed)
Patient seen today for stroke and seizure follow-up. Followed by Dr. Rexene Alberts for sleep apnea on CPAP. He reports requesting a new CPAP machine through his DME company over 1 year ago but still waiting (told still on backorder?). Per Dr. Guadelupe Sabin recent [place] in 12/2020, discussed obtaining new machine and may need to pursue repeat sleep study. Wife and patient request if Dr. Rexene Alberts could assist with obtaining a new CPAP machine. Thank you

## 2021-07-10 NOTE — Telephone Encounter (Signed)
I will order a home sleep test so we can confirm his sleep apnea diagnosis; I will prescribe a new machine after the test confirms his diagnosis.  Until then, please ask him to use his current machine.  Please also advise patient to do the home sleep test without CPAP therapy that night, for diagnostic purposes.

## 2021-07-10 NOTE — Patient Instructions (Signed)
Continue Eliquis (apixaban) daily  and atorvastatin 40mg  daily  for secondary stroke prevention  Continue keppra 500mg  twice daily for seizure prevention  Continue to follow up with PCP regarding cholesterol, blood pressure and diabetes management  Maintain strict control of hypertension with blood pressure goal below 130/90, diabetes with hemoglobin A1c goal below 7.0 % and cholesterol with LDL cholesterol (bad cholesterol) goal below 70 mg/dL.   Signs of a Stroke? Follow the BEFAST method:  Balance Watch for a sudden loss of balance, trouble with coordination or vertigo Eyes Is there a sudden loss of vision in one or both eyes? Or double vision?  Face: Ask the person to smile. Does one side of the face droop or is it numb?  Arms: Ask the person to raise both arms. Does one arm drift downward? Is there weakness or numbness of a leg? Speech: Ask the person to repeat a simple phrase. Does the speech sound slurred/strange? Is the person confused ? Time: If you observe any of these signs, call 911.     Followup in the future with me in 1 year or call earlier if needed       Thank you for coming to see [place] at [facility]. I hope we have been able to provide you high quality care today.  You may receive a patient satisfaction survey over the next few weeks. We would appreciate your feedback and comments so that we may continue to improve ourselves and the health of our patients.

## 2021-07-10 NOTE — Telephone Encounter (Signed)
Spoke with patient and advised him of the plan to have a new HST to establish a new baseline/updated sleep apnea diagnosis, then we can order a new machine. Pt aware NOT to use his CPAP the night of the sleep study, but otherwise continue using his current machine until he gets a new one. Pt was very appreciative and verbalized understanding.
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
## 2021-07-10 NOTE — Addendum Note (Signed)
Addended by: [name] on: 07/10/2021 05:02 PM   Modules accepted: Orders

## 2021-07-11 ENCOUNTER — Encounter (INDEPENDENT_AMBULATORY_CARE_PROVIDER_SITE_OTHER): Payer: PPO | Admitting: Ophthalmology

## 2021-07-11 MED ORDER — ALBUTEROL SULFATE HFA 108 (90 BASE) MCG/ACT IN AERS: 2.0000 | INHALATION_SPRAY | Freq: Four times a day (QID) | RESPIRATORY_TRACT | 4 refills | Status: DC | PRN

## 2021-07-11 MED ORDER — FLUTICASONE PROPIONATE 50 MCG/ACT NA SUSP: 2.0000 | Freq: Every day | NASAL | 6 refills | Status: DC

## 2021-07-11 NOTE — Telephone Encounter (Signed)
Pts spouse called to see if 90 day supply refills for Albuterol and Fluticasone were sent in / please advise asap

## 2021-07-11 NOTE — Telephone Encounter (Signed)
Requested medication (s) are due for refill today: Yes  Requested medication (s) are on the active medication list:Yes  Last refill:  6 days ago  Future visit scheduled: No  Notes to clinic:  Pharmacy requesting 90 day supply, see notes attached from pharmacy     Requested Prescriptions  Pending Prescriptions Disp Refills   fluticasone (FLONASE) 50 MCG/ACT nasal spray 16 g 6    Sig: Place 2 sprays into both nostrils daily.     Ear, Nose, and Throat: Nasal Preparations - Corticosteroids Passed - 07/11/2021  4:02 PM      Passed - Valid encounter within last 12 months    Recent Outpatient Visits           6 days ago Uncontrolled type 2 diabetes mellitus with hyperglycemia, without long-term current use of insulin ([place])   [place], Schlusser T, NP   2 months ago Uncontrolled type 2 diabetes mellitus with hyperglycemia, without long-term current use of insulin ([place])   [place], Jolene T, NP   4 months ago Uncontrolled type 2 diabetes mellitus with hyperglycemia, without long-term current use of insulin ([place])   [place], [place] T, NP   6 months ago OSA (obstructive sleep apnea)   [place], Henrine Screws T, NP   7 months ago Appointment canceled by hospital   [facility], Megan P, DO       Future Appointments             In 2 weeks  [place], [facility]   In 2 months [name], [place] T, NP [employer], [facility]             albuterol (VENTOLIN HFA) 108 (90 Base) MCG/ACT inhaler 18 g 4    Sig: Inhale 2 puffs into the lungs every 6 (six) hours as needed for wheezing or shortness of breath.     Pulmonology:  Beta Agonists Failed - 07/11/2021  4:02 PM      Failed - One inhaler should last at least one month. If the patient is requesting refills earlier, contact the patient to check for uncontrolled symptoms.      Passed - Valid encounter within last 12  months    Recent Outpatient Visits           6 days ago Uncontrolled type 2 diabetes mellitus with hyperglycemia, without long-term current use of insulin ([place])   [place], Jolene T, NP   2 months ago Uncontrolled type 2 diabetes mellitus with hyperglycemia, without long-term current use of insulin ([place])   [place], Jolene T, NP   4 months ago Uncontrolled type 2 diabetes mellitus with hyperglycemia, without long-term current use of insulin ([place])   [place], [place] T, NP   6 months ago OSA (obstructive sleep apnea)   [place], [name], NP   7 months ago Appointment canceled by hospital   [facility] Valerie Roys, DO       Future Appointments             In 2 weeks  [place], [place]   In 2 months [name], [name], NP [employer] [facility], [facility]

## 2021-07-12 ENCOUNTER — Encounter: Payer: Self-pay | Admitting: Radiation Oncology

## 2021-07-12 ENCOUNTER — Other Ambulatory Visit: Payer: Self-pay

## 2021-07-12 ENCOUNTER — Ambulatory Visit
Admission: RE | Admit: 2021-07-12 | Discharge: 2021-07-12 | Disposition: A | Discharge status: Home / Self Care | Payer: PPO | Source: Ambulatory Visit | Attending: Radiation Oncology | Admitting: Radiation Oncology

## 2021-07-12 VITALS — BP 156/82 | HR 60 | Temp 98.6°F | Wt 203.5 lb

## 2021-07-12 DIAGNOSIS — C61 Malignant neoplasm of prostate: Secondary | ICD-10-CM | POA: Insufficient documentation

## 2021-07-12 DIAGNOSIS — Z923 Personal history of irradiation: Secondary | ICD-10-CM | POA: Diagnosis not present

## 2021-07-12 DIAGNOSIS — I4891 Unspecified atrial fibrillation: Secondary | ICD-10-CM | POA: Diagnosis not present

## 2021-07-12 DIAGNOSIS — Z08 Encounter for follow-up examination after completed treatment for malignant neoplasm: Secondary | ICD-10-CM | POA: Diagnosis not present

## 2021-07-12 NOTE — Progress Notes (Signed)
Radiation Oncology Follow up Note  Name: Andrew Cobb   Date:   07/12/2021 MRN:  [id number] DOB: 12/07/1950    This 71 y.o. 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  REFERRING PROVIDER: [name], NP  HPI: Patient is a 71 year old male now out 16 months having completed image guided IMRT radiation therapy for stage III adenocarcinoma Gleason 6 of the prostate.  Seen today in routine follow-up he is doing well.  He specifically denies increased lower urinary tract symptoms diarrhea or fatigue.[name] Kitchen  He is currently on Flomax.  His PSA continues to decline recently 0.5 down from 0.84   7 months ago he recently had cardioversion for A. fib which was successful.  COMPLICATIONS OF TREATMENT: none  FOLLOW UP COMPLIANCE: keeps appointments   PHYSICAL EXAM:  BP (!) 156/82    Pulse 60    Temp 98.6 F (37 C) (Tympanic)    Wt 203 lb 8 oz (92.3 kg)    BMI 30.05 kg/m  Well-developed well-nourished patient in NAD. HEENT reveals PERLA, EOMI, discs not visualized.  Oral cavity is clear. No oral mucosal lesions are identified. Neck is clear without evidence of cervical or supraclavicular adenopathy. Lungs are clear to A&P. Cardiac examination is essentially unremarkable with regular rate and rhythm without murmur rub or thrill. Abdomen is benign with no organomegaly or masses noted. Motor sensory and DTR levels are equal and symmetric in the upper and lower extremities. Cranial nerves II through XII are grossly intact. Proprioception is intact. No peripheral adenopathy or edema is identified. No motor or sensory levels are noted. Crude visual fields are within normal range.  RADIOLOGY RESULTS: No current films for review  PLAN: Present time patient is doing well under excellent biochemical control of his prostate cancer.  I will see him back in 1  year for follow-up.  Patient knows to call with any concerns.  I would like to take this opportunity to thank you for allowing me to participate in the care of your patient.Noreene Filbert, MD

## 2021-07-12 NOTE — Progress Notes (Signed)
Carelink Summary Report / Loop Recorder 

## 2021-07-15 ENCOUNTER — Encounter (INDEPENDENT_AMBULATORY_CARE_PROVIDER_SITE_OTHER): Payer: PPO | Admitting: Ophthalmology

## 2021-07-16 ENCOUNTER — Telehealth: Payer: Self-pay | Admitting: Nurse Practitioner

## 2021-07-16 MED ORDER — FLUTICASONE PROPIONATE 50 MCG/ACT NA SUSP: 2.0000 | Freq: Every day | NASAL | 4 refills | Status: DC

## 2021-07-16 NOTE — Telephone Encounter (Signed)
Copied from [place] [id number]. Topic: General - Other >> Jul 16, 2021  2:41 PM Yvette Rack wrote: Reason for CRM: Sarah with [facility] requests new Rx for 90 day supply of the following: fluticasone (FLONASE) 50 MCG/ACT nasal spray and albuterol (VENTOLIN HFA) 108 (90 Base) MCG/ACT inhaler. Judson Roch also requests approval to fill albuterol (VENTOLIN HFA) 108 (90 Base) MCG/ACT inhaler with generic if insurance will not cover Rx. Cb# [id number] Reference# [id number]

## 2021-07-16 NOTE — Telephone Encounter (Signed)
Spoke with Andrew Cobb to notify them of new prescription for Flonase and made pharmacist aware of Andrew Cobb's recommendations regarding Albuterol prescription with insurance. Pharmacist verbalized understanding and has no further questions.

## 2021-07-17 ENCOUNTER — Ambulatory Visit (INDEPENDENT_AMBULATORY_CARE_PROVIDER_SITE_OTHER): Payer: PPO | Admitting: Ophthalmology

## 2021-07-17 ENCOUNTER — Encounter (INDEPENDENT_AMBULATORY_CARE_PROVIDER_SITE_OTHER): Payer: PPO | Admitting: Ophthalmology

## 2021-07-17 ENCOUNTER — Encounter (INDEPENDENT_AMBULATORY_CARE_PROVIDER_SITE_OTHER): Payer: Self-pay | Admitting: Ophthalmology

## 2021-07-17 ENCOUNTER — Other Ambulatory Visit: Payer: Self-pay

## 2021-07-17 DIAGNOSIS — H353121 Nonexudative age-related macular degeneration, left eye, early dry stage: Secondary | ICD-10-CM | POA: Insufficient documentation

## 2021-07-17 DIAGNOSIS — H353211 Exudative age-related macular degeneration, right eye, with active choroidal neovascularization: Secondary | ICD-10-CM

## 2021-07-17 MED ORDER — AFLIBERCEPT 2MG/0.05ML IZ SOLN FOR KALEIDOSCOPE
2.0000 mg | INTRAVITREAL | Status: AC | PRN
  Administered 2021-07-17: 2 mg via INTRAVITREAL

## 2021-07-17 NOTE — Assessment & Plan Note (Signed)
Today at 13 weeks post most recent injection Eylea OD with increased subretinal hyper reflective material and slight increase in fluid.  Repeat injection today recommend follow-up next in 6 weeks

## 2021-07-17 NOTE — Progress Notes (Signed)
07/17/2021     CHIEF COMPLAINT Patient presents for  Chief Complaint  Patient presents with   Retina Follow Up    6 week fu OD and Avastin OD Pt states VA OU stable since last visit. Pt denies FOL, floaters, or ocular pain OU.  A1C: 6.7 LBS: 113       HISTORY OF PRESENT ILLNESS: Andrew Cobb is a 71 y.o. male who presents to the clinic today for:   HPI     Retina Follow Up           Diagnosis: Wet AMD   Laterality: right eye   Onset: 13 weeks ago   Severity: mild   Duration: 13 weeks   Course: stable   Comments: 6 week fu OD and Avastin OD Pt states VA OU stable since last visit. Pt denies FOL, floaters, or ocular pain OU.  A1C: 6.7 LBS: 113          Comments   13 weeks fu OD oct Eylea OD (pt chose date due to [facility]). Patient states vision is stable and unchanged since last visit. Denies any new floaters or FOL. Pt states 2 weeks ago pt had sx for AFIB.      Last edited by Laurin Coder on 07/17/2021  9:00 AM.      Referring physician: [name], NP [place],  [place] [id number]  HISTORICAL INFORMATION:   Selected notes from the MEDICAL RECORD NUMBER    Lab Results  Component Value Date   HGBA1C 6.9 (H) 07/05/2021     CURRENT MEDICATIONS: No current outpatient medications on file. (Ophthalmic Drugs)   No current facility-administered medications for this visit. (Ophthalmic Drugs)   Current Outpatient Medications (Other)  Medication Sig   acyclovir (ZOVIRAX) 400 MG tablet Take 1 tablet (400 mg total) by mouth 2 (two) times daily.   albuterol (VENTOLIN HFA) 108 (90 Base) MCG/ACT inhaler Inhale 2 puffs into the lungs every 6 (six) hours as needed for wheezing or shortness of breath.   amitriptyline (ELAVIL) 25 MG tablet Take 1 tablet (25 mg total) by mouth at bedtime.   apixaban (ELIQUIS) 5 MG TABS tablet Take 1 tablet (5 mg total) by mouth 2 (two) times daily.   atorvastatin (LIPITOR) 40 MG tablet Take 1 tablet (40 mg  total) by mouth at bedtime.   betamethasone dipropionate (DIPROLENE) 0.05 % ointment Apply 1 application topically 2 (two) times daily as needed for rash.   cetirizine (ZYRTEC) 10 MG tablet Take 10 mg by mouth daily.   Cholecalciferol (VITAMIN D) 2000 units CAPS Take 2,000 Units by mouth daily.   clobetasol ointment (TEMOVATE) 4.40 % Apply 1 application topically 2 (two) times daily as needed (rash).   Cyanocobalamin (B-12) 5000 MCG CAPS Take 5,000 mcg by mouth every other day.   empagliflozin (JARDIANCE) 10 MG TABS tablet Take 1 tablet (10 mg total) by mouth daily before breakfast.   fluticasone (FLONASE) 50 MCG/ACT nasal spray Place 2 sprays into both nostrils daily.   glucose blood (ONETOUCH ULTRA) test strip 1 each by Other route 2 (two) times daily. Use as instructed   Lancets (ONETOUCH ULTRASOFT) lancets USE 1  TO CHECK GLUCOSE TWICE DAILY   levETIRAcetam (KEPPRA) 500 MG tablet Take 1 tablet (500 mg total) by mouth 2 (two) times daily.   metFORMIN (GLUCOPHAGE) 1000 MG tablet Take 1 tablet (1,000 mg total) by mouth 2 (two) times daily with a meal.   Multiple Vitamins-Minerals (  MULTIVITAMIN PO) Take 1 tablet by mouth daily.    tamsulosin (FLOMAX) 0.4 MG CAPS capsule Take 1 capsule (0.4 mg total) by mouth at bedtime.   telmisartan (MICARDIS) 80 MG tablet Take 0.5 tablets (40 mg total) by mouth daily.   No current facility-administered medications for this visit. (Other)      REVIEW OF SYSTEMS:    ALLERGIES Allergies  Allergen Reactions   Succinylsulphathiazole Rash   Sulfamethoxazole-Trimethoprim Rash   Tetracyclines & Related Rash    PAST MEDICAL HISTORY Past Medical History:  Diagnosis Date   Arthritis    Asthma    has not needed since 6/21   Cancer [facility])    prostate   Chronic venous insufficiency    varicose vein lower extremity with inflammation   Coronary artery disease 1996   two stents placed    Diabetes mellitus without complication ([place])    type 2 on  metformin   GERD (gastroesophageal reflux disease)    no issues since gastric bypass surgery as stated per pt   Hyperlipidemia    Hypertension    Hypogonadism in male    MRSA (methicillin resistant Staphylococcus aureus) infection    07/30/2008 thru 08/07/2008  abdominal abcess   Sleep apnea    on BIPAP   Stented coronary artery    Stroke ([place]) 09/07/2019   no defecits   Thrombocythemia    Past Surgical History:  Procedure Laterality Date   ANGIOPLASTY     ANGIOPLASTY     with stent 04/07/1995   ANTERIOR CERVICAL DECOMPRESSION/DISCECTOMY FUSION 4 LEVELS N/A 08/17/2017   Procedure: Anterior discectomy with fusion and plate fixation Cervical Three-Four, Four-Five, Five-Six, and Six-Seven Fusion;  Surgeon: Ditty, Kevan Ny, MD;  Location: [place];  Service: Neurosurgery;  Laterality: N/A;  Anterior discectomy with fusion and plate fixation Cervical Three-Four, Four-Five, Five-Six, and Six-Seven Fusion    APPENDECTOMY     1966   BUBBLE STUDY  09/20/2018   Procedure: BUBBLE STUDY;  Surgeon: Josue Hector, MD;  Location: [facility] ENDOSCOPY;  Service: Cardiovascular;;   [place] TEST     07/31/2011   CARDIOVERSION N/A 07/04/2021   Procedure: CARDIOVERSION;  Surgeon: [name], MD;  Location: [place] ENDOSCOPY;  Service: Cardiovascular;  Laterality: N/A;   CARPAL TUNNEL RELEASE Left    CHOLECYSTECTOMY     2006   COLONOSCOPY WITH PROPOFOL N/A 03/26/2021   Procedure: COLONOSCOPY WITH PROPOFOL;  Surgeon: Jonathon Bellows, MD;  Location: [facility] ENDOSCOPY;  Service: Gastroenterology;  Laterality: N/A;   colonscopy      08/25/2012   EYE SURGERY Bilateral    cataract   FUNCTIONAL ENDOSCOPIC SINUS SURGERY     11/10/2013   GASTRIC BYPASS     10/05/2012   HERNIA REPAIR     left inguinal 1981   INCISION AND DRAINAGE ABSCESS Right 02/26/2017   Procedure: INCISION AND DRAINAGE ABSCESS;  Surgeon: Nickie Retort, MD;  Location: [facility] ORS;  Service: Urology;   Laterality: Right;   JOINT REPLACEMENT     bilateral   left ankle surgery      05/03/2003    left carpel tunnel      09/18/1993   left knee meniscal tear      01/25/2010   left knee meniscal tear repair      05/04/1996   left rotator cuff repair      05/03/2003    LOOP RECORDER INSERTION N/A 09/20/2018   Procedure: LOOP RECORDER INSERTION;  Surgeon: Evans Lance, MD;  Location: [place] CV LAB;  Service: Cardiovascular;  Laterality: N/A;   REPLACEMENT TOTAL KNEE BILATERAL  07/13/2015   REVERSE SHOULDER ARTHROPLASTY Left 08/30/2020   Procedure: REVERSE SHOULDER ARTHROPLASTY;  Surgeon: Justice Britain, MD;  Location: [facility] ORS;  Service: Orthopedics;  Laterality: Left;  166min   right ankle surgery      fracture has 2 screws 07/07/1997   right carpel tunnel      05/16/1992   right shoulder replacement      01/27/2006   SCROTAL EXPLORATION Right 02/26/2017   Procedure: SCROTUM EXPLORATION;  Surgeon: Nickie Retort, MD;  Location: [facility] ORS;  Service: Urology;  Laterality: Right;   TEE WITHOUT CARDIOVERSION N/A 09/20/2018   Procedure: TRANSESOPHAGEAL ECHOCARDIOGRAM (TEE);  Surgeon: Josue Hector, MD;  Location[facility] ENDOSCOPY;  Service: Cardiovascular;  Laterality: N/A;   TOTAL KNEE ARTHROPLASTY Left 07/13/2015   Procedure: LEFT TOTAL KNEE ARTHROPLASTY;  Surgeon: Gaynelle Arabian, MD;  Location: [facility] ORS;  Service: Orthopedics;  Laterality: Left;    FAMILY HISTORY Family History  Problem Relation Age of Onset   Cancer Mother        pancreatic   Diabetes Mother    Stroke Mother    Heart disease Mother    Hyperlipidemia Mother    Hypertension Mother    Heart attack Mother    Heart disease Father    Stroke Father    Diabetes Father    Hypertension Father    Heart attack Father    Hyperlipidemia Father    Pancreatic cancer Father    Cancer Sister    Cancer Brother        lung   Cancer Brother    Kidney cancer Neg Hx    Bladder Cancer Neg Hx    Prostate cancer Neg Hx     SOCIAL  HISTORY Social History   Tobacco Use   Smoking status: Former    Packs/day: 1.00    Years: 10.00    Pack years: 10.00    Types: Cigarettes    Quit date: 07/07/1984    Years since quitting: 37.0   Smokeless tobacco: Never   Tobacco comments:    quit 1986  Vaping Use   Vaping Use: Never used  Substance Use Topics   Alcohol use: No    Alcohol/week: 0.0 standard drinks   Drug use: No         OPHTHALMIC EXAM:  Base Eye Exam     Visual Acuity (ETDRS)       Right Left   Dist [place] 20/25 -2 20/25 -2         Tonometry (Tonopen, 9:04 AM)       Right Left   Pressure 15 16         Pupils       Pupils Dark Light APD   Right PERRL 4 3 None   Left PERRL 4 3 None         Visual Fields       Left Right    Full Full         Extraocular Movement       Right Left    Full Full         Neuro/Psych     Oriented x3: Yes   Mood/Affect: Normal         Dilation     Right eye: 1.0% Mydriacyl, 2.5% Phenylephrine @ 9:04 AM  Slit Lamp and Fundus Exam     External Exam       Right Left   External Normal Normal         Slit Lamp Exam       Right Left   Lids/Lashes Normal Normal   Conjunctiva/Sclera White and quiet White and quiet   Cornea Clear Clear   Anterior Chamber Deep and quiet Deep and quiet   Iris Round and reactive Round and reactive   Lens Posterior chamber intraocular lens Posterior chamber intraocular lens   Anterior Vitreous Normal Normal         Fundus Exam       Right Left   Posterior Vitreous Posterior vitreous detachment, Central vitreous floaters    Disc Normal    C/D Ratio 0.6    Macula Hard drusen, no exudates, Macular thickening, Soft drusen, Pigmented atrophy, Retinal pigment epithelial mottling, Retinal pigment epithelial detachment    Vessels Normal, , no DR    Periphery Normal             IMAGING AND PROCEDURES  Imaging and Procedures for 07/17/21  Intravitreal Injection, Pharmacologic Agent  - OD - Right Eye       Time Out 07/17/2021. 9:55 AM. Confirmed correct patient, procedure, site, and patient consented.   Anesthesia Topical anesthesia was used. Anesthetic medications included Lidocaine 4%.   Procedure Preparation included Tobramycin 0.3%, 10% betadine to eyelids, 5% betadine to ocular surface. A 30 gauge needle was used.   Injection: 2 mg aflibercept 2 MG/0.05ML   Route: Intravitreal, Site: Right Eye   NDC: A3590391, Lot: [id number], Waste: 0 mL   Post-op Post injection exam found visual acuity of at least counting fingers. The patient tolerated the procedure well. There were no complications. The patient received written and verbal post procedure care education. Post injection medications included ocuflox.      OCT, Retina - OU - Both Eyes       Right Eye Quality was good. Scan locations included subfoveal. Central Foveal Thickness: 346. Progression has been stable. Findings include abnormal foveal contour, subretinal fluid, pigment epithelial detachment, no IRF.   Left Eye Quality was good. Scan locations included subfoveal. Central Foveal Thickness: 289. Progression has been stable. Findings include retinal drusen , abnormal foveal contour, no SRF, no IRF.   Notes Today OD at 13 week interval, small change with increased serous retinal detachment adjacent to the vascularized pigment epithelial detachment nasal to the FAZ.  We will repeat injection today Eylea today next and examination again in 6 weeks,              ASSESSMENT/PLAN:  Exudative age-related macular degeneration of right eye with active choroidal neovascularization ([place]) Today at 13 weeks post most recent injection Eylea OD with increased subretinal hyper reflective material and slight increase in fluid.  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  Exudative age-related macular degeneration of right eye with active choroidal neovascularization (HCC)  H35.3211 Intravitreal Injection, Pharmacologic Agent - OD - Right Eye    OCT, Retina - OU - Both Eyes    aflibercept (EYLEA) SOLN 2 mg    2. 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  Repeat intravitreal Eylea OD today at 13-week interval and recommend short interval  follow-up to minimize risk of progression.  Follow-up next OD in 6 weeks  2.  3.  Ophthalmic Meds Ordered this visit:  Meds ordered this encounter  Medications   aflibercept (EYLEA) SOLN 2 mg       Return in about 6 weeks (around 08/28/2021) for dilate, OD, EYLEA OCT.  There are no Patient Instructions on file for this visit.   Explained the diagnoses, plan, and follow up with the patient and they expressed understanding.  Patient expressed understanding of the importance of proper follow up care.   [name] Tiernan Suto M.D. Diseases & Surgery of the Retina and Vitreous Retina & Diabetic [place] 07/17/21     Abbreviations: M myopia (nearsighted); A astigmatism; H hyperopia (farsighted); P presbyopia; Mrx spectacle prescription;  CTL contact lenses; OD right eye; OS left eye; OU both eyes  XT exotropia; ET esotropia; PEK punctate epithelial keratitis; PEE punctate epithelial erosions; DES dry eye syndrome; MGD meibomian gland dysfunction; ATs artificial tears; PFAT's preservative free artificial tears; [place] nuclear sclerotic cataract; PSC posterior subcapsular cataract; ERM epi-retinal membrane; PVD posterior vitreous detachment; RD retinal detachment; DM diabetes mellitus; DR diabetic retinopathy; NPDR non-proliferative diabetic retinopathy; PDR proliferative diabetic retinopathy; CSME clinically significant macular edema; DME diabetic macular edema; dbh dot blot hemorrhages; CWS cotton wool spot; POAG primary open angle glaucoma; C/D cup-to-disc ratio; HVF humphrey visual  field; GVF goldmann visual field; OCT optical coherence tomography; IOP intraocular pressure; BRVO Branch retinal vein occlusion; CRVO central retinal vein occlusion; CRAO central retinal artery occlusion; BRAO branch retinal artery occlusion; RT retinal tear; SB scleral buckle; PPV pars plana vitrectomy; VH Vitreous hemorrhage; PRP panretinal laser photocoagulation; IVK intravitreal kenalog; VMT vitreomacular traction; MH Macular hole;  NVD neovascularization of the disc; NVE neovascularization elsewhere; AREDS age related eye disease study; ARMD age related macular degeneration; POAG primary open angle glaucoma; EBMD epithelial/anterior basement membrane dystrophy; ACIOL anterior chamber intraocular lens; IOL intraocular lens; PCIOL posterior chamber intraocular lens; Phaco/IOL phacoemulsification with intraocular lens placement; [place] photorefractive keratectomy; LASIK laser assisted in situ keratomileusis; HTN hypertension; DM diabetes mellitus; COPD chronic obstructive pulmonary disease

## 2021-07-17 NOTE — Assessment & Plan Note (Signed)
No sign of CNVM OS by OCT 

## 2021-07-22 ENCOUNTER — Ambulatory Visit (INDEPENDENT_AMBULATORY_CARE_PROVIDER_SITE_OTHER): Payer: PPO

## 2021-07-22 ENCOUNTER — Ambulatory Visit (INDEPENDENT_AMBULATORY_CARE_PROVIDER_SITE_OTHER): Payer: PPO | Admitting: Neurology

## 2021-07-22 DIAGNOSIS — G4733 Obstructive sleep apnea (adult) (pediatric): Secondary | ICD-10-CM

## 2021-07-22 DIAGNOSIS — E1169 Type 2 diabetes mellitus with other specified complication: Secondary | ICD-10-CM

## 2021-07-22 DIAGNOSIS — E785 Hyperlipidemia, unspecified: Secondary | ICD-10-CM

## 2021-07-22 DIAGNOSIS — I152 Hypertension secondary to endocrine disorders: Secondary | ICD-10-CM

## 2021-07-22 DIAGNOSIS — E1165 Type 2 diabetes mellitus with hyperglycemia: Secondary | ICD-10-CM

## 2021-07-22 DIAGNOSIS — E1159 Type 2 diabetes mellitus with other circulatory complications: Secondary | ICD-10-CM

## 2021-07-22 NOTE — Patient Instructions (Signed)
Andrew Cobb,  Thank you for talking with me today. I have included our care plan/goals in the following pages.   Please review and call me at [phone] with any questions.  Thanks! Ellin Mayhew, PharmD Clinical Pharmacist  [phone]  Care Plan : Andrew Cobb  Updates made by Madelin Rear, [facility] since 07/22/2021 12:00 AM     Problem: HLD DM T2DM Afib Asthma HTN OSA   Priority: High     Long-Range Goal: Disease State Management   Start Date: 07/22/2021  This Visit's Progress: On track  Priority: High  Note:    Interventions: 1:1 collaboration with Marnee Guarneri T, NP regarding development and update of comprehensive plan of care as evidenced by provider attestation and co-signature Inter-disciplinary care team collaboration (see longitudinal plan of care) Comprehensive medication review performed; medication list updated in electronic medical record  Hypertension (BP goal <130/80 per neuro) -Not ideally controlled -Current treatment: Telmisartan 40 mg once daily   Appropriate, Query effective -Current home readings: 222L systolic  -Current dietary habits: see DM -Current exercise habits: see DM -Denies hypotensive/hypertensive symptoms -Educated on BP goals and benefits of medications for prevention of heart attack, stroke and kidney damage; Importance of home blood pressure monitoring; Proper BP monitoring technique; -Counseled to monitor BP at home 1-2x/wk, document, and provide log at future appointments -Counseled on diet and exercise extensively Recommended to continue current medication 1 month HC f/u  Hyperlipidemia: (LDL goal < 70) -Controlled -Current treatment: Atorvastatin 40 mg once daily Appropriate, Effective, Safe, Accessible  -Current dietary patterns: see DM -Current exercise habits: see DM -Educated on Cholesterol goals;  Benefits of statin for ASCVD risk reduction; Importance of limiting foods high in  cholesterol; -Counseled on diet and exercise extensively Recommended to continue current medication  Diabetes (A1c goal <7%) -Controlled -Current medications: Metformin 1000 mg twice daily  Jardiance 10 mg once daily  -Medications previously tried: n/a  -Current home glucose readings fasting glucose: 120s post prandial glucose: n/a -Denies hypoglycemic/hyperglycemic symptoms -Current meal patterns: huge focus portion control, has managed to lose 80 lbs altogether after back surgery  -Current exercise: no formal exercise -Educated on A1c and blood sugar goals; Benefits of weight loss; Benefits of routine self-monitoring of blood sugar; -Counseled to check feet daily and get yearly eye exams -Counseled on diet and exercise extensively Recommended to continue current medication HC to follow-up on Jardiance PAP approval and update patient   Patient Goals/Self-Care Activities Patient will:  - take medications as prescribed as evidenced by patient report and record review collaborate with provider on medication access solutions    The patient verbalized understanding of instructions provided today and agreed to receive a MyChart copy of patient instruction and/or educational materials. Telephone follow up appointment with pharmacy team member scheduled for: See next appointment with "Care Management Staff" under "What's Next" below.

## 2021-07-22 NOTE — Progress Notes (Signed)
Chronic Care Management Pharmacy Note  07/22/2021 Name:  PAVLE WILER MRN:  [id number] DOB:  1951/01/16  Summary: Has at home sleep study scheduled for tonight; had previous CPAP 15+ years Jardiance PAP has been submitted for 2023 year - pharmacist assistant to check on approval and update patient if anything is needed. Cc'd chart  Subjective: Andrew Cobb is an 71 y.o. year old male who is a primary patient of [name], [name], NP.  The CCM team was consulted for assistance with disease management and care coordination needs.    Engaged with patient by telephone for follow up visit in response to provider referral for pharmacy case management and/or care coordination services.   Consent to Services:  The patient was given information about Chronic Care Management services, agreed to services, and gave verbal consent prior to initiation of services.  Please see initial visit note for detailed documentation.   Patient Care Team: [name], NP as PCP - General (Nurse Practitioner) Evans Lance, MD as PCP - Electrophysiology (Cardiology) Vanita Ingles, RN as Case Manager ([place]) Noreene Filbert, MD as Radiation Oncologist (Radiation Oncology) Madelin Rear, [facility] as Pharmacist (Pharmacist)  Hospital visits: None in previous 6 months  Objective:  Lab Results  Component Value Date   CREATININE 1.27 07/05/2021   CREATININE 1.11 07/04/2021   CREATININE 1.20 05/06/2021    Lab Results  Component Value Date   HGBA1C 6.9 (H) 07/05/2021   HGBA1C 6.9 (H) 05/06/2021   HGBA1C 6.8 02/21/2021   Last diabetic Eye exam:  Lab Results  Component Value Date/Time   HMDIABEYEEXA No Retinopathy 10/03/2020 12:00 AM    Last diabetic Foot exam: No results found for: HMDIABFOOTEX      Component Value Date/Time   CHOL 103 07/05/2021 1000   CHOL 116 05/06/2021 1338   CHOL 114 11/23/2020 1009   CHOL 111 01/25/2018 0815   CHOL 203 (H) 10/08/2016 0842   TRIG 132  07/05/2021 1000   TRIG 107 05/06/2021 1338   TRIG 78 11/23/2020 1009   TRIG 150 (H) 01/25/2018 0815   TRIG 217 (H) 10/08/2016 0842   HDL 49 07/05/2021 1000   HDL 48 05/06/2021 1338   HDL 55 11/23/2020 1009   CHOLHDL 2.6 09/08/2019 0350   VLDL 15 09/08/2019 0350   VLDL 30 (H) 01/25/2018 0815   LDLCALC 31 07/05/2021 1000   LDLCALC 48 05/06/2021 1338   LDLCALC 43 11/23/2020 1009    Hepatic Function Latest Ref Rng & Units 07/05/2021 05/06/2021 11/23/2020  Total Protein 6.0 - 8.5 g/dL 6.0 6.6 6.9  Albumin 3.8 - 4.8 g/dL 4.3 4.3 4.5  AST 0 - 40 IU/L _0 ALT 0 - 44 IU/L _1 Alk Phosphatase 44 - 121 IU/L 100 100 116  Total Bilirubin 0.0 - 1.2 mg/dL 0.6 1.0 0.6    Lab Results  Component Value Date/Time   TSH 2.350 05/06/2021 01:38 PM   TSH 1.730 11/23/2020 10:09 AM    CBC Latest Ref Rng & Units 07/05/2021 07/04/2021 05/06/2021  WBC 3.4 - 10.8 x10E3/uL 4.1 4.5 4.5  Hemoglobin 13.0 - 17.7 g/dL 13.3 14.3 12.6(L)  Hematocrit 37.5 - 51.0 % 40.3 44.2 37.0(L)  Platelets 150 - 450 x10E3/uL 131(L) 127(L) 147(L)    Lab Results  Component Value Date/Time   VD25OH 44.0 05/06/2021 01:38 PM   VD25OH 39.4 11/23/2020 10:09 AM    Clinical ASCVD:  The ASCVD Risk score (Arnett DK, et  al., 2019) failed to calculate for the following reasons:   The patient has a prior MI or stroke diagnosis   Social History   Tobacco Use  Smoking Status Former   Packs/day: 1.00   Years: 10.00   Pack years: 10.00   Types: Cigarettes   Quit date: 07/07/1984   Years since quitting: 37.0  Smokeless Tobacco Never  Tobacco Comments   quit 1986   BP Readings from Last 3 Encounters:  07/12/21 (!) 156/82  07/10/21 127/76  07/05/21 119/73   Pulse Readings from Last 3 Encounters:  07/12/21 60  07/10/21 74  07/05/21 65   Wt Readings from Last 3 Encounters:  07/12/21 203 lb 8 oz (92.3 kg)  07/10/21 203 lb (92.1 kg)  07/05/21 204 lb (92.5 kg)   BMI Readings from Last 3 Encounters:  07/12/21  30.05 kg/m  07/10/21 29.98 kg/m  07/05/21 30.35 kg/m    Assessment: Review of patient past medical history, allergies, medications, health status, including review of consultants reports, laboratory and other test data, was performed as part of comprehensive evaluation and provision of chronic care management services.   SDOH:  (Social Determinants of Health) assessments and interventions performed:    CCM Care Plan  Allergies  Allergen Reactions   Succinylsulphathiazole Rash   Sulfamethoxazole-Trimethoprim Rash   Tetracyclines & Related Rash    Medications Reviewed Today     Reviewed by Madelin Rear, [facility] (Pharmacist) on 07/22/21 at 1333  Med List Status: <None>   Medication Order Taking? Sig Documenting Provider Last Dose Status Informant  acyclovir (ZOVIRAX) 400 MG tablet [id number] No Take 1 tablet (400 mg total) by mouth 2 (two) times daily. Marnee Guarneri T, NP Taking Active Spouse/Significant Other  albuterol (VENTOLIN HFA) 108 (90 Base) MCG/ACT inhaler [id number] No Inhale 2 puffs into the lungs every 6 (six) hours as needed for wheezing or shortness of breath. Marnee Guarneri T, NP Taking Active   amitriptyline (ELAVIL) 25 MG tablet [id number] No Take 1 tablet (25 mg total) by mouth at bedtime. Marnee Guarneri T, NP Taking Active Spouse/Significant Other  apixaban (ELIQUIS) 5 MG TABS tablet [id number] No Take 1 tablet (5 mg total) by mouth 2 (two) times daily. Fenton, Clint R, PA Taking Active Spouse/Significant Other  atorvastatin (LIPITOR) 40 MG tablet [id number] No Take 1 tablet (40 mg total) by mouth at bedtime. Marnee Guarneri T, NP Taking Active Spouse/Significant Other  betamethasone dipropionate (DIPROLENE) 0.05 % ointment [id number] No Apply 1 application topically 2 (two) times daily as needed for rash. [provider] Taking Active Spouse/Significant Other  cetirizine (ZYRTEC) 10 MG tablet [id number] No Take 10 mg by mouth daily. [provider] Taking  Active Spouse/Significant Other  Cholecalciferol (VITAMIN D) 2000 units CAPS [id number] No Take 2,000 Units by mouth daily. [provider] Taking Active Spouse/Significant Other  clobetasol ointment (TEMOVATE) 0.05 % [id number] No Apply 1 application topically 2 (two) times daily as needed (rash). [provider] Taking Active Spouse/Significant Other  Cyanocobalamin (B-12) 5000 MCG CAPS [id number] No Take 5,000 mcg by mouth every other day. [provider] Taking Active Spouse/Significant Other  empagliflozin (JARDIANCE) 10 MG TABS tablet [id number] No Take 1 tablet (10 mg total) by mouth daily before breakfast. Marnee Guarneri T, NP Taking Active Spouse/Significant Other  fluticasone (FLONASE) 50 MCG/ACT nasal spray [id number]  Place 2 sprays into both nostrils daily. Marnee Guarneri T, NP  Active   glucose blood (ONETOUCH ULTRA) test strip [id number] No 1 each by Other route 2 (two)  times daily. Use as instructed [name], NP Taking Active Spouse/Significant Other  Lancets (ONETOUCH ULTRASOFT) lancets [id number] No USE 1  TO CHECK GLUCOSE TWICE DAILY [name], Jolene T, NP Taking Active Spouse/Significant Other  levETIRAcetam (KEPPRA) 500 MG tablet [id number] No Take 1 tablet (500 mg total) by mouth 2 (two) times daily. Marnee Guarneri T, NP Taking Active   metFORMIN (GLUCOPHAGE) 1000 MG tablet [id number] No Take 1 tablet (1,000 mg total) by mouth 2 (two) times daily with a meal. [name], Jolene T, NP Taking Active Spouse/Significant Other  Multiple Vitamins-Minerals (MULTIVITAMIN PO) [id number] No Take 1 tablet by mouth daily.  [provider] Taking Active Spouse/Significant Other  tamsulosin (FLOMAX) 0.4 MG CAPS capsule [id number] No Take 1 capsule (0.4 mg total) by mouth at bedtime. Marnee Guarneri T, NP Taking Active Spouse/Significant Other  telmisartan (MICARDIS) 80 MG tablet [id number] No Take 0.5 tablets (40 mg total) by mouth daily. 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 07/17/2021   Persistent atrial fibrillation ([place]) 05/16/2021   Secondary hypercoagulable state ([place]) 05/16/2021   Thrombocytopenia ([place]) 02/22/2021   Low hemoglobin 02/21/2021   Aortic atherosclerosis ([place]) 09/26/2020   S/P reverse total shoulder arthroplasty, left 09/11/2020   Serous detachment of retinal pigment epithelium of right eye 01/31/2020   Pseudophakia 01/31/2020   Exudative age-related macular degeneration of right eye with active choroidal neovascularization ([place]) 01/31/2020   Seizures ([place]) 10/18/2019   Hyperlipidemia associated with type 2 diabetes mellitus ([place]) 06/19/2019   Expressive aphasia 06/19/2019   Vitamin D deficiency 06/19/2019   Chronic venous insufficiency    Asthma    Arthritis    Hypogonadism in male    History of stroke    H/O right coronary artery stent placement    Allergic rhinitis 08/19/2018   Cervical radiculopathy 08/17/2017   Advanced care planning/counseling discussion 07/21/2017   History of prostate cancer 12/08/2016   H/O bariatric surgery 07/17/2016   Hypertension associated with diabetes ([place]) 04/24/2015   Uncontrolled type 2 diabetes mellitus with hyperglycemia, without long-term current use of insulin ([place]) 04/24/2015   Chronic sinusitis 08/13/2009   OSA (obstructive sleep apnea) 04/02/2009   Coronary artery disease 1996    Immunization History  Administered Date(s) Administered   Fluad Quad(high Dose 65+) 03/16/2019, 03/20/2020, 04/22/2021   Influenza Whole 04/09/2009   Influenza, High Dose Seasonal PF 04/06/2017, 04/22/2018   Influenza,inj,Quad PF,6+ Mos 04/24/2015   Influenza,inj,quad, With Preservative 04/06/2020   Influenza-Unspecified 03/07/2014, 04/06/2017   Moderna Sars-Covid-2 Vaccination 12/19/2019, 01/16/2020   Pneumococcal Conjugate-13  03/20/2020   Pneumococcal Polysaccharide-23 05/07/2021   Td 03/12/2005   Tdap 07/17/2016   Zoster, Live 07/17/2016    Conditions to be addressed/monitored: HLD DM T2DM Afib Asthma HTN OSA  Care Plan : [place]  Updates made by Madelin Rear, [place] since 07/22/2021 12:00 AM     Problem: HLD DM T2DM Afib Asthma HTN OSA   Priority: High     Long-Range Goal: Disease State Management   Start Date: 07/22/2021  This Visit's Progress: On track  Priority: High  Note:    Interventions: 1:1 collaboration with [name], NP regarding development and update of comprehensive plan of care as evidenced by provider attestation and co-signature Inter-disciplinary care team collaboration (see longitudinal plan of care) Comprehensive medication review performed; medication  list updated in electronic medical record  Hypertension (BP goal <130/80 per neuro) -Not ideally controlled -Current treatment: Telmisartan 40 mg once daily   Appropriate, Query effective -Current home readings: 101B systolic  -Current dietary habits: see DM -Current exercise habits: see DM -Denies hypotensive/hypertensive symptoms -Educated on BP goals and benefits of medications for prevention of heart attack, stroke and kidney damage; Importance of home blood pressure monitoring; Proper BP monitoring technique; -Counseled to monitor BP at home 1-2x/wk, document, and provide log at future appointments -Counseled on diet and exercise extensively Recommended to continue current medication 1 month HC f/u  Hyperlipidemia: (LDL goal < 70) -Controlled -Current treatment: Atorvastatin 40 mg once daily Appropriate, Effective, Safe, Accessible  -Current dietary patterns: see DM -Current exercise habits: see DM -Educated on Cholesterol goals;  Benefits of statin for ASCVD risk reduction; Importance of limiting foods high in cholesterol; -Counseled on diet and exercise extensively Recommended to  continue current medication  Diabetes (A1c goal <7%)(neuro <6.5%) -Controlled -Current medications: Metformin 1000 mg twice daily  Jardiance 10 mg once daily  -Medications previously tried: n/a  -Current home glucose readings fasting glucose: 120s post prandial glucose: n/a -Denies hypoglycemic/hyperglycemic symptoms -Current meal patterns: huge focus portion control, has managed to lose 80 lbs altogether after back surgery  -Current exercise: no formal exercise -Educated on A1c and blood sugar goals; Benefits of weight loss; Benefits of routine self-monitoring of blood sugar; -Counseled to check feet daily and get yearly eye exams -Counseled on diet and exercise extensively Recommended to continue current medication HC to follow-up on Jardiance PAP approval and update patient  -Can also provide support on Jardiance if dose increase requested   Patient Goals/Self-Care Activities Patient will:  - take medications as prescribed as evidenced by patient report and record review collaborate with provider on medication access solutions    Current Barriers:  Unable to independently afford treatment regimen  Pharmacist Clinical Goal(s):  Patient will verbalize ability to afford treatment regimen maintain control of DM and HTN as evidenced by a1c and BP  through collaboration with PharmD and provider.    Medication Assistance: Application for Jardiance  medication assistance program. in process.  Anticipated assistance start date Jan 2023.  See plan of care for additional detail.  Patient's preferred pharmacy is:  [facility] [address] [place] - [place] [address] Phone: [phone] Fax: [phone]  [facility]) - [place], [place] [address] Phone: [phone] Fax: [phone]   Pt endorses 100% compliance  Follow  Up:  Patient agrees to Care Plan and Follow-up. Plan: HC 1 month BP. Pharmacist 6 month telephone   Future Appointments  Date Time Provider [place]  07/23/2021 10:30 AM Oliver Barre, [place] MC-AFIBC None  07/24/2021 10:30 AM CFP NURSE HEALTH ADVISOR CFP-CFP PEC  08/12/2021  7:10 AM CVD[facility]CH[facility] DEVICE REMOTES [facility]  08/28/2021 10:00 AM [name], [name], MD RDE-RDE None  09/16/2021  7:10 AM [facility] DEVICE REMOTES [facility]  10/04/2021  8:20 AM Marnee Guarneri T, NP CFP-CFP PEC  10/21/2021  7:10 AM [facility] DEVICE REMOTES [facility]  11/25/2021  7:10 AM [facility] DEVICE REMOTES [facility]  12/30/2021  7:10 AM [facility] DEVICE REMOTES [facility]  01/27/2022  9:00 AM CFP CCM PHARMACY CFP-CFP PEC  07/08/2022  9:30 AM CCAR-MO LAB CHCC-BOC None  07/10/2022 10:45 AM McCue, [name], NP GNA-GNA None  07/14/2022  9:30 AM Chrystal, Eulas Post, MD CHCC-BRT None    Madelin Rear, PharmD, [facility] Clinical Pharmacist  [facility]  [phone]

## 2021-07-23 ENCOUNTER — Encounter (HOSPITAL_COMMUNITY): Payer: Self-pay | Admitting: Physician Assistant

## 2021-07-23 ENCOUNTER — Telehealth: Payer: Self-pay

## 2021-07-23 ENCOUNTER — Other Ambulatory Visit: Payer: Self-pay

## 2021-07-23 ENCOUNTER — Ambulatory Visit (HOSPITAL_COMMUNITY)
Admission: RE | Admit: 2021-07-23 | Discharge: 2021-07-23 | Disposition: A | Discharge status: Home / Self Care | Payer: PPO | Source: Ambulatory Visit | Attending: Physician Assistant | Admitting: Physician Assistant

## 2021-07-23 VITALS — BP 136/76 | HR 72 | Ht 69.0 in | Wt 202.2 lb

## 2021-07-23 DIAGNOSIS — G4733 Obstructive sleep apnea (adult) (pediatric): Secondary | ICD-10-CM | POA: Insufficient documentation

## 2021-07-23 DIAGNOSIS — Z6829 Body mass index (BMI) 29.0-29.9, adult: Secondary | ICD-10-CM | POA: Diagnosis not present

## 2021-07-23 DIAGNOSIS — Z87891 Personal history of nicotine dependence: Secondary | ICD-10-CM | POA: Diagnosis not present

## 2021-07-23 DIAGNOSIS — I2510 Atherosclerotic heart disease of native coronary artery without angina pectoris: Secondary | ICD-10-CM | POA: Insufficient documentation

## 2021-07-23 DIAGNOSIS — D6869 Other thrombophilia: Secondary | ICD-10-CM | POA: Diagnosis not present

## 2021-07-23 DIAGNOSIS — I4892 Unspecified atrial flutter: Secondary | ICD-10-CM | POA: Diagnosis not present

## 2021-07-23 DIAGNOSIS — Z09 Encounter for follow-up examination after completed treatment for conditions other than malignant neoplasm: Secondary | ICD-10-CM | POA: Diagnosis not present

## 2021-07-23 DIAGNOSIS — I4819 Other persistent atrial fibrillation: Secondary | ICD-10-CM | POA: Diagnosis not present

## 2021-07-23 DIAGNOSIS — E669 Obesity, unspecified: Secondary | ICD-10-CM | POA: Diagnosis not present

## 2021-07-23 DIAGNOSIS — Z8673 Personal history of transient ischemic attack (TIA), and cerebral infarction without residual deficits: Secondary | ICD-10-CM | POA: Diagnosis not present

## 2021-07-23 DIAGNOSIS — Z8616 Personal history of COVID-19: Secondary | ICD-10-CM | POA: Diagnosis not present

## 2021-07-23 DIAGNOSIS — E119 Type 2 diabetes mellitus without complications: Secondary | ICD-10-CM | POA: Diagnosis not present

## 2021-07-23 DIAGNOSIS — I10 Essential (primary) hypertension: Secondary | ICD-10-CM | POA: Insufficient documentation

## 2021-07-23 DIAGNOSIS — Z7901 Long term (current) use of anticoagulants: Secondary | ICD-10-CM | POA: Diagnosis not present

## 2021-07-23 NOTE — Progress Notes (Signed)
Contacted [facility] for the update on patient assitance application on Jardiance. The representative stated they are missing pages 2 and 4 in order to process this application. It will need to be faxed to [phone].

## 2021-07-23 NOTE — Progress Notes (Signed)
Primary Care Physician: [name], NP Primary Cardiologist: Dr Josefa Half Primary Electrophysiologist: Dr Lovena Le Referring Physician: Dr [facility]   Andrew Cobb is a 71 y.o. male with a history of CAD, DM, OSA, HTN, prior CVA, atrial fibrillation who presents for follow up in the [facility].  The patient was initially diagnosed with atrial fibrillation on ILR 05/07/21 which was implanted for cryptogenic CVA in 09/2018. Patient has a CHADS2VASC score of 6. He was unaware of his arrhythmia. Of note, he was also diagnosed with COVID around the same time. He reports good compliance with his CPAP machine. He denies significant alcohol use.   On follow up today, patient is s/p DCCV on 07/04/21. He reports that he has done well since the procedure. He does not feel any different in SR. No bleeding issues on anticoagulation.   Today, he denies symptoms of palpitations, chest pain, shortness of breath, orthopnea, PND, lower extremity edema, dizziness, presyncope, syncope, snoring, daytime somnolence, bleeding, or neurologic sequela. The patient is tolerating medications without difficulties and is otherwise without complaint today.    Atrial Fibrillation Risk Factors:  he does have symptoms or diagnosis of sleep apnea. he is compliant with CPAP therapy. Trying to get a new machine. he does not have a history of rheumatic fever. he does not have a history of alcohol use. The patient does have a history of early familial atrial fibrillation or other arrhythmias. Father and brother have afib.  he has a BMI of Body mass index is 29.86 kg/m.[name] Kitchen Filed Weights   07/23/21 1017  Weight: 91.7 kg      Family History  Problem Relation Age of Onset   Cancer Mother        pancreatic   Diabetes Mother    Stroke Mother    Heart disease Mother    Hyperlipidemia Mother    Hypertension Mother    Heart attack Mother    Heart disease Father    Stroke  Father    Diabetes Father    Hypertension Father    Heart attack Father    Hyperlipidemia Father    Pancreatic cancer Father    Cancer Sister    Cancer Brother        lung   Cancer Brother    Kidney cancer Neg Hx    Bladder Cancer Neg Hx    Prostate cancer Neg Hx      Atrial Fibrillation Management history:  Previous antiarrhythmic drugs: none Previous cardioversions: 07/04/21 Previous ablations: none CHADS2VASC score: 6 Anticoagulation history: Eliquis   Past Medical History:  Diagnosis Date   Arthritis    Asthma    has not needed since 6/21   Cancer [facility])    prostate   Chronic venous insufficiency    varicose vein lower extremity with inflammation   Coronary artery disease 1996   two stents placed    Diabetes mellitus without complication ([place])    type 2 on metformin   GERD (gastroesophageal reflux disease)    no issues since gastric bypass surgery as stated per pt   Hyperlipidemia    Hypertension    Hypogonadism in male    MRSA (methicillin resistant Staphylococcus aureus) infection    07/30/2008 thru 08/07/2008  abdominal abcess   Sleep apnea    on BIPAP   Stented coronary artery    Stroke ([place]) 09/07/2019   no defecits   Thrombocythemia    Past Surgical History:  Procedure Laterality Date  ANGIOPLASTY     ANGIOPLASTY     with stent 04/07/1995   ANTERIOR CERVICAL DECOMPRESSION/DISCECTOMY FUSION 4 LEVELS N/A 08/17/2017   Procedure: Anterior discectomy with fusion and plate fixation Cervical Three-Four, Four-Five, Five-Six, and Six-Seven Fusion;  Surgeon: Ditty, Kevan Ny, MD;  Location: [place];  Service: Neurosurgery;  Laterality: N/A;  Anterior discectomy with fusion and plate fixation Cervical Three-Four, Four-Five, Five-Six, and Six-Seven Fusion    APPENDECTOMY     1966   BUBBLE STUDY  09/20/2018   Procedure: BUBBLE STUDY;  Surgeon: Josue Hector, MD;  Location: [facility] ENDOSCOPY;  Service: Cardiovascular;;   [place] TEST     07/31/2011   CARDIOVERSION N/A 07/04/2021   Procedure: CARDIOVERSION;  Surgeon: [name], MD;  Location: [place] ENDOSCOPY;  Service: Cardiovascular;  Laterality: N/A;   CARPAL TUNNEL RELEASE Left    CHOLECYSTECTOMY     2006   COLONOSCOPY WITH PROPOFOL N/A 03/26/2021   Procedure: COLONOSCOPY WITH PROPOFOL;  Surgeon: Jonathon Bellows, MD;  Location: [facility] ENDOSCOPY;  Service: Gastroenterology;  Laterality: N/A;   colonscopy      08/25/2012   EYE SURGERY Bilateral    cataract   FUNCTIONAL ENDOSCOPIC SINUS SURGERY     11/10/2013   GASTRIC BYPASS     10/05/2012   HERNIA REPAIR     left inguinal 1981   INCISION AND DRAINAGE ABSCESS Right 02/26/2017   Procedure: INCISION AND DRAINAGE ABSCESS;  Surgeon: Nickie Retort, MD;  Location: [facility] ORS;  Service: Urology;  Laterality: Right;   JOINT REPLACEMENT     bilateral   left ankle surgery      05/03/2003    left carpel tunnel      09/18/1993   left knee meniscal tear      01/25/2010   left knee meniscal tear repair      05/04/1996   left rotator cuff repair      05/03/2003    LOOP RECORDER INSERTION N/A 09/20/2018   Procedure: LOOP RECORDER INSERTION;  Surgeon: Evans Lance, MD;  Location: [place] CV LAB;  Service: Cardiovascular;  Laterality: N/A;   REPLACEMENT TOTAL KNEE BILATERAL  07/13/2015   REVERSE SHOULDER ARTHROPLASTY Left 08/30/2020   Procedure: REVERSE SHOULDER ARTHROPLASTY;  Surgeon: Justice Britain, MD;  Location: [facility] ORS;  Service: Orthopedics;  Laterality: Left;  183min   right ankle surgery      fracture has 2 screws 07/07/1997   right carpel tunnel      05/16/1992   right shoulder replacement      01/27/2006   SCROTAL EXPLORATION Right 02/26/2017   Procedure: SCROTUM EXPLORATION;  Surgeon: Nickie Retort, MD;  Location: [facility] ORS;  Service: Urology;  Laterality: Right;   TEE WITHOUT CARDIOVERSION N/A 09/20/2018   Procedure: TRANSESOPHAGEAL ECHOCARDIOGRAM (TEE);  Surgeon: Josue Hector,  MD;  Location[facility]COPY;  Service: Cardiovascular;  Laterality: N/A;   TOTAL KNEE ARTHROPLASTY Left 07/13/2015   Procedure: LEFT TOTAL KNEE ARTHROPLASTY;  Surgeon: Gaynelle Arabian, MD;  Location: [facility] ORS;  Service: Orthopedics;  Laterality: Left;    Current Outpatient Medications  Medication Sig Dispense Refill   acyclovir (ZOVIRAX) 400 MG tablet Take 1 tablet (400 mg total) by mouth 2 (two) times daily. 180 tablet 4   albuterol (VENTOLIN HFA) 108 (90 Base) MCG/ACT inhaler Inhale 2 puffs into the lungs every 6 (six) hours as needed for wheezing or shortness of breath. 18 g 4   amitriptyline (ELAVIL) 25  MG tablet Take 1 tablet (25 mg total) by mouth at bedtime. 90 tablet 4   apixaban (ELIQUIS) 5 MG TABS tablet Take 1 tablet (5 mg total) by mouth 2 (two) times daily. 60 tablet 11   atorvastatin (LIPITOR) 40 MG tablet Take 1 tablet (40 mg total) by mouth at bedtime. 90 tablet 4   betamethasone dipropionate (DIPROLENE) 0.05 % ointment Apply 1 application topically 2 (two) times daily as needed for rash.     cetirizine (ZYRTEC) 10 MG tablet Take 10 mg by mouth daily.     Cholecalciferol (VITAMIN D) 2000 units CAPS Take 2,000 Units by mouth daily.     clobetasol ointment (TEMOVATE) 6.65 % Apply 1 application topically 2 (two) times daily as needed (rash).     Cyanocobalamin (B-12) 5000 MCG CAPS Take 5,000 mcg by mouth every other day.     empagliflozin (JARDIANCE) 10 MG TABS tablet Take 1 tablet (10 mg total) by mouth daily before breakfast. 90 tablet 3   fluticasone (FLONASE) 50 MCG/ACT nasal spray Place 2 sprays into both nostrils daily. 48 g 4   glucose blood (ONETOUCH ULTRA) test strip 1 each by Other route 2 (two) times daily. Use as instructed 100 each 6   Lancets (ONETOUCH ULTRASOFT) lancets USE 1  TO CHECK GLUCOSE TWICE DAILY 100 each 11   levETIRAcetam (KEPPRA) 500 MG tablet Take 1 tablet (500 mg total) by mouth 2 (two) times daily. 180 tablet 3   metFORMIN (GLUCOPHAGE) 1000 MG tablet Take 1  tablet (1,000 mg total) by mouth 2 (two) times daily with a meal. 180 tablet 4   Multiple Vitamins-Minerals (MULTIVITAMIN PO) Take 1 tablet by mouth daily.      tamsulosin (FLOMAX) 0.4 MG CAPS capsule Take 1 capsule (0.4 mg total) by mouth at bedtime. 90 capsule 4   telmisartan (MICARDIS) 80 MG tablet Take 0.5 tablets (40 mg total) by mouth daily. 45 tablet 4   No current facility-administered medications for this encounter.    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[employer]   Tobacco Use   Smoking status: Former    Packs/day: 1.00    Years: 10.00    Pack years: 10.00    Types: Cigarettes    Quit date: 07/07/1984    Years since quitting: 37.0   Smokeless tobacco: Never   Tobacco comments:    Former Smoker quit 1986 07/23/2021  Vaping Use   Vaping Use: Never used  Substance and Sexual Activity   Alcohol use: No    Alcohol/week: 0.0 standard drinks   Drug use: No   Sexual activity: Yes  Other Topics Concern   Not on file  Social History Narrative   Not on file   Social Determinants of Health   Financial Resource Strain: Not on file  Food Insecurity: Not on file  Transportation Needs: Not on file  Physical Activity: Not on file  Stress: Not on file  Social Connections: Not on file  Intimate Partner Violence: Not on file     ROS- All systems are reviewed and negative except as per the HPI above.  Physical Exam: Vitals:   07/23/21 1017  BP: 136/76  Pulse: 72  Weight: 91.7 kg  Height: 5\' 9"  (1.753 m)    GEN- The patient is a well appearing male, alert and oriented x 3 today.   HEENT-head normocephalic,  atraumatic, sclera clear, conjunctiva pink, hearing intact, trachea midline. Lungs- Clear to ausculation bilaterally, normal work of breathing Heart- Regular rate and rhythm, no murmurs, rubs or gallops  GI- soft, NT, ND, + BS Extremities- no clubbing, cyanosis, or edema MS- no significant deformity or atrophy Skin- no rash or lesion Psych- euthymic mood, full affect Neuro- strength and sensation are intact   Wt Readings from Last 3 Encounters:  07/23/21 91.7 kg  07/12/21 92.3 kg  07/10/21 92.1 kg    EKG today demonstrates  SR, 1st degree AV block Vent. rate 72 BPM PR interval 220 ms QRS duration 98 ms QT/QTcB 412/451 ms  Echo 09/19/18 demonstrated  1. The left ventricle has normal systolic function with an ejection  fraction of 60-65%. The cavity size was normal. There is mild concentric left ventricular hypertrophy. Left ventricular diastolic Doppler parameters are consistent with impaired relaxation.   2. The mitral valve is grossly normal.   3. The tricuspid valve is grossly normal.   4. The aortic valve is tricuspid Aortic valve regurgitation is trivial by  color flow Doppler. Moderate annular calcification seen.   5. The right ventricle has normal systolic function. The cavity was  normal. There is no increase in right ventricular wall thickness.   Epic records are reviewed at length today  CHA2DS2-VASc Score = 6  The patient's score is based upon: CHF History: 0 HTN History: 1 Diabetes History: 1 Stroke History: 2 Vascular Disease History: 1 Age Score: 1 Gender Score: 0       ASSESSMENT AND PLAN: 1. Persistent Atrial Fibrillation/atrial flutter The patient's CHA2DS2-VASc score is 6, indicating a 9.7% annual risk of stroke.   S/p DCCV on 07/04/21 Patient back in SR. Continue Eliquis 5 mg BID  2. Secondary Hypercoagulable State (ICD10:  D68.69) The patient is at significant risk for stroke/thromboembolism based upon his CHA2DS2-VASc Score of 6.  Continue  Apixaban (Eliquis).   3. Obesity Body mass index is 29.86 kg/m. Lifestyle modification was discussed and encouraged including regular physical activity and weight reduction.  4. Obstructive sleep apnea Patient reports compliance with CPAP therapy. He is working on getting a Proofreader. Followed by neurology.   5. CAD No anginal symptoms.  6. HTN Stable, no changes today.   Follow up in the AF clinic in 6 months. Sooner if persistent afib noted on ILR.   [facility] [address] [place], [place] [address] [phone] 07/23/2021 10:34 AM

## 2021-07-24 ENCOUNTER — Ambulatory Visit (INDEPENDENT_AMBULATORY_CARE_PROVIDER_SITE_OTHER): Payer: PPO | Admitting: *Deleted

## 2021-07-24 DIAGNOSIS — Z0000 Encounter for general adult medical examination without abnormal findings: Secondary | ICD-10-CM

## 2021-07-24 NOTE — Progress Notes (Signed)
Subjective:   Andrew Cobb is a 71 y.o. male who presents for Medicare Annual/Subsequent preventive examination.  I connected with  DONAVAN KERLIN on 07/24/21 by a telephone enabled telemedicine application and verified that I am speaking with the correct person using two identifiers.   I discussed the limitations of evaluation and management by telemedicine. The patient expressed understanding and agreed to proceed.  Patient location: home  Provider location: [place] not in office    Review of Systems     Cardiac Risk Factors include: advanced age (>79men, >69 women);diabetes mellitus;male gender;hypertension     Objective:    Today's Vitals   07/24/21 1032  PainSc: 4    There is no height or weight on file to calculate BMI.  Advanced Directives 07/24/2021 07/12/2021 07/04/2021 03/26/2021 11/12/2020 08/30/2020 08/22/2020  Does Patient Have a Medical Advance Directive? Yes Yes Yes Yes No Yes Yes  Type of Paramedic of [place];Living will Living will;Healthcare Power of [place];Living will [place];Living will  Does patient want to make changes to medical advance directive? - No - Patient declined - - - No - Patient declined No - Patient declined  Copy of [place] in Chart? Yes - validated most recent copy scanned in chart (See row information) - - - - No - copy requested No - copy requested  Would patient like information on creating a medical advance directive? No - Patient declined No - Patient declined - - - - -    Current Medications (verified) Outpatient Encounter Medications as of 07/24/2021  Medication Sig   acyclovir (ZOVIRAX) 400 MG tablet Take 1 tablet (400 mg total) by mouth 2 (two) times daily.   albuterol (VENTOLIN HFA) 108 (90 Base) MCG/ACT inhaler Inhale 2 puffs into the lungs every 6 (six) hours as needed for wheezing or shortness  of breath.   amitriptyline (ELAVIL) 25 MG tablet Take 1 tablet (25 mg total) by mouth at bedtime.   apixaban (ELIQUIS) 5 MG TABS tablet Take 1 tablet (5 mg total) by mouth 2 (two) times daily.   atorvastatin (LIPITOR) 40 MG tablet Take 1 tablet (40 mg total) by mouth at bedtime.   betamethasone dipropionate (DIPROLENE) 0.05 % ointment Apply 1 application topically 2 (two) times daily as needed for rash.   cetirizine (ZYRTEC) 10 MG tablet Take 10 mg by mouth daily.   Cholecalciferol (VITAMIN D) 2000 units CAPS Take 2,000 Units by mouth daily.   clobetasol ointment (TEMOVATE) 1.75 % Apply 1 application topically 2 (two) times daily as needed (rash).   Cyanocobalamin (B-12) 5000 MCG CAPS Take 5,000 mcg by mouth every other day.   empagliflozin (JARDIANCE) 10 MG TABS tablet Take 1 tablet (10 mg total) by mouth daily before breakfast.   fluticasone (FLONASE) 50 MCG/ACT nasal spray Place 2 sprays into both nostrils daily.   glucose blood (ONETOUCH ULTRA) test strip 1 each by Other route 2 (two) times daily. Use as instructed   Lancets (ONETOUCH ULTRASOFT) lancets USE 1  TO CHECK GLUCOSE TWICE DAILY   levETIRAcetam (KEPPRA) 500 MG tablet Take 1 tablet (500 mg total) by mouth 2 (two) times daily.   metFORMIN (GLUCOPHAGE) 1000 MG tablet Take 1 tablet (1,000 mg total) by mouth 2 (two) times daily with a meal.   Multiple Vitamins-Minerals (MULTIVITAMIN PO) Take 1 tablet by mouth daily.    tamsulosin (FLOMAX) 0.4 MG CAPS capsule Take  1 capsule (0.4 mg total) by mouth at bedtime.   telmisartan (MICARDIS) 80 MG tablet Take 0.5 tablets (40 mg total) by mouth daily.   No facility-administered encounter medications on file as of 07/24/2021.    Allergies (verified) Succinylsulphathiazole, Sulfamethoxazole-trimethoprim, and Tetracyclines & related   History: Past Medical History:  Diagnosis Date   Arthritis    Asthma    has not needed since 6/21   Cancer [facility])    prostate   Chronic venous insufficiency     varicose vein lower extremity with inflammation   Coronary artery disease 1996   two stents placed    Diabetes mellitus without complication ([place])    type 2 on metformin   GERD (gastroesophageal reflux disease)    no issues since gastric bypass surgery as stated per pt   Hyperlipidemia    Hypertension    Hypogonadism in male    MRSA (methicillin resistant Staphylococcus aureus) infection    07/30/2008 thru 08/07/2008  abdominal abcess   Sleep apnea    on BIPAP   Stented coronary artery    Stroke ([place]) 09/07/2019   no defecits   Thrombocythemia    Past Surgical History:  Procedure Laterality Date   ANGIOPLASTY     ANGIOPLASTY     with stent 04/07/1995   ANTERIOR CERVICAL DECOMPRESSION/DISCECTOMY FUSION 4 LEVELS N/A 08/17/2017   Procedure: Anterior discectomy with fusion and plate fixation Cervical Three-Four, Four-Five, Five-Six, and Six-Seven Fusion;  Surgeon: Ditty, Kevan Ny, MD;  Location: [place];  Service: Neurosurgery;  Laterality: N/A;  Anterior discectomy with fusion and plate fixation Cervical Three-Four, Four-Five, Five-Six, and Six-Seven Fusion    APPENDECTOMY     1966   BUBBLE STUDY  09/20/2018   Procedure: BUBBLE STUDY;  Surgeon: Josue Hector, MD;  Location: [facility] ENDOSCOPY;  Service: Cardiovascular;;   [place] TEST     07/31/2011   CARDIOVERSION N/A 07/04/2021   Procedure: CARDIOVERSION;  Surgeon: [name], MD;  Location: [place] ENDOSCOPY;  Service: Cardiovascular;  Laterality: N/A;   CARPAL TUNNEL RELEASE Left    CHOLECYSTECTOMY     2006   COLONOSCOPY WITH PROPOFOL N/A 03/26/2021   Procedure: COLONOSCOPY WITH PROPOFOL;  Surgeon: Jonathon Bellows, MD;  Location: [facility] ENDOSCOPY;  Service: Gastroenterology;  Laterality: N/A;   colonscopy      08/25/2012   EYE SURGERY Bilateral    cataract   FUNCTIONAL ENDOSCOPIC SINUS SURGERY     11/10/2013   GASTRIC BYPASS     10/05/2012   HERNIA REPAIR     left inguinal 1981    INCISION AND DRAINAGE ABSCESS Right 02/26/2017   Procedure: INCISION AND DRAINAGE ABSCESS;  Surgeon: Nickie Retort, MD;  Location: [facility] ORS;  Service: Urology;  Laterality: Right;   JOINT REPLACEMENT     bilateral   left ankle surgery      05/03/2003    left carpel tunnel      09/18/1993   left knee meniscal tear      01/25/2010   left knee meniscal tear repair      05/04/1996   left rotator cuff repair      05/03/2003    LOOP RECORDER INSERTION N/A 09/20/2018   Procedure: LOOP RECORDER INSERTION;  Surgeon: Evans Lance, MD;  Location: [place] CV LAB;  Service: Cardiovascular;  Laterality: N/A;   REPLACEMENT TOTAL KNEE BILATERAL  07/13/2015   REVERSE SHOULDER ARTHROPLASTY Left 08/30/2020   Procedure: REVERSE SHOULDER ARTHROPLASTY;  Surgeon: Justice Britain, MD;  Location: [facility] ORS;  Service: Orthopedics;  Laterality: Left;  171min   right ankle surgery      fracture has 2 screws 07/07/1997   right carpel tunnel      05/16/1992   right shoulder replacement      01/27/2006   SCROTAL EXPLORATION Right 02/26/2017   Procedure: SCROTUM EXPLORATION;  Surgeon: Nickie Retort, MD;  Location: [facility] ORS;  Service: Urology;  Laterality: Right;   TEE WITHOUT CARDIOVERSION N/A 09/20/2018   Procedure: TRANSESOPHAGEAL ECHOCARDIOGRAM (TEE);  Surgeon: Josue Hector, MD;  Location: [facility] ENDOSCOPY;  Service: Cardiovascular;  Laterality: N/A;   TOTAL KNEE ARTHROPLASTY Left 07/13/2015   Procedure: LEFT TOTAL KNEE ARTHROPLASTY;  Surgeon: Gaynelle Arabian, MD;  Location: [facility] ORS;  Service: Orthopedics;  Laterality: Left;   Family History  Problem Relation Age of Onset   Cancer Mother        pancreatic   Diabetes Mother    Stroke Mother    Heart disease Mother    Hyperlipidemia Mother    Hypertension Mother    Heart attack Mother    Heart disease Father    Stroke Father    Diabetes Father    Hypertension Father    Heart attack Father    Hyperlipidemia Father    Pancreatic cancer Father    Cancer  Sister    Cancer Brother        lung   Cancer Brother    Kidney cancer Neg Hx    Bladder Cancer Neg Hx    Prostate cancer Neg Hx    Social History   Socioeconomic History   Marital status: Married    Spouse name: Not on file   Number of children: Not on file   Years of education: Not on file   Highest education level: Some college, no degree  Occupational History    Comment: drives for [employer]   Tobacco Use   Smoking status: Former    Packs/day: 1.00    Years: 10.00    Pack years: 10.00    Types: Cigarettes    Quit date: 07/07/1984    Years since quitting: 37.0   Smokeless tobacco: Never   Tobacco comments:    Former Smoker quit 1986 07/23/2021  Vaping Use   Vaping Use: Never used  Substance and Sexual Activity   Alcohol use: No    Alcohol/week: 0.0 standard drinks   Drug use: No   Sexual activity: Yes  Other Topics Concern   Not on file  Social History Narrative   Not on file   Social Determinants of Health   Financial Resource Strain: Low Risk    Difficulty of Paying Living Expenses: Not hard at all  Food Insecurity: No Food Insecurity   Worried About Charity fundraiser in the Last Year: Never true   [place] in the Last Year: Never true  Transportation Needs: No Transportation Needs   Lack of Transportation (Medical): No   Lack of Transportation (Non-Medical): No  Physical Activity: Sufficiently Active   Days of Exercise per Week: 4 days   Minutes of Exercise per Session: 50 min  Stress: No Stress Concern Present   Feeling of Stress : Not at all  Social Connections: Socially Integrated   Frequency of Communication with Friends and Family: Twice a week   Frequency of Social Gatherings with Friends and Family: More than three times a week   Attends Religious Services: More than 4  times per year   Active Member of Clubs or Organizations: Yes   Attends Archivist Meetings: More than 4 times per year   Marital Status: Married    Tobacco  Counseling Counseling given: Not Answered Tobacco comments: Former Smoker quit 1986 07/23/2021   Clinical Intake:  Pre-visit preparation completed: Yes  Pain : No/denies pain (arthritis) Pain Score: 4      Nutritional Risks: None Diabetes: Yes CBG done?: No Did pt. bring in CBG monitor from home?: No  How often do you need to have someone help you when you read instructions, pamphlets, or other written materials from your doctor or pharmacy?: 1 - Never  Diabetic?  Yes   Nutrition Risk Assessment:  Has the patient had any N/V/D within the last 2 months?  No  Does the patient have any non-healing wounds?  No  Has the patient had any unintentional weight loss or weight gain?  No   Diabetes:  Is the patient diabetic?  Yes  If diabetic, was a CBG obtained today?  No  Did the patient bring in their glucometer from home?  No  How often do you monitor your CBG's? 1 x day.   Financial Strains and Diabetes Management:  Are you having any financial strains with the device, your supplies or your medication? No .  Does the patient want to be seen by Chronic Care Management for management of their diabetes?  No  Would the patient like to be referred to a Nutritionist or for Diabetic Management?  No   Diabetic Exams:  Diabetic Eye Exam: Completed . Overdue for diabetic eye exam. Pt has been advised about the importance in completing this exam.   Diabetic Foot Exam:. Pt has been advised about the importance in completing this exam.  Interpreter Needed?: No  Information entered by :: Leroy Kennedy LPN   Activities of Daily Living In your present state of health, do you have any difficulty performing the following activities: 07/24/2021 05/06/2021  Hearing? Tempie Donning  Vision? N N  Difficulty concentrating or making decisions? N N  Walking or climbing stairs? N N  Dressing or bathing? N N  Doing errands, shopping? N N  Preparing Food and eating ? N -  Using the Toilet? N -  In the  past six months, have you accidently leaked urine? N -  Do you have problems with loss of bowel control? N -  Managing your Medications? N -  Managing your Finances? N -  Housekeeping or managing your Housekeeping? N -  Some recent data might be hidden    Patient Care Team: [name], NP as PCP - General (Nurse Practitioner) Evans Lance, MD as PCP - Electrophysiology (Cardiology) Vanita Ingles, RN as Case Manager ([place]) Noreene Filbert, MD as Radiation Oncologist (Radiation Oncology) Madelin Rear, [facility] as Pharmacist (Pharmacist)  Indicate any recent Medical Services you may have received from other than [facility] providers in the past year (date may be approximate).     Assessment:   This is a routine wellness examination for Xan.  Hearing/Vision screen Hearing Screening - Comments:: Has to get a hearing aid replacement Vision Screening - Comments:: Up to date Dr. Zadie Rhine  Dietary issues and exercise activities discussed: Current Exercise Habits: Structured exercise class, Type of exercise: stretching (areobics), Time (Minutes): 40, Frequency (Times/Week): 4, Weekly Exercise (Minutes/Week): 160, Intensity: Moderate   Goals Addressed             This  Visit's Progress    Patient Stated       Continue current lifestyle , staying active       Depression Screen PHQ 2/9 Scores 07/24/2021 05/06/2021 07/20/2020 07/02/2020 10/18/2019 09/26/2019 07/18/2019  PHQ - 2 Score 0 0 0 0 0 0 0  PHQ- 9 Score - 0 - - 0 - -    Fall Risk Fall Risk  07/24/2021 05/06/2021 07/20/2020 07/02/2020 07/18/2019  Falls in the past year? 0 0 1 0 1  Comment - - tripped - -  Number falls in past yr: 0 0 0 0 0  Injury with Fall? 0 0 0 0 0  Risk for fall due to : - No Fall Risks History of fall(s);Medication side effect - -  Follow up Falls evaluation completed;Falls prevention discussed Falls evaluation completed Falls evaluation completed;Education provided;Falls prevention discussed -  -    FALL RISK PREVENTION PERTAINING TO THE HOME:  Any stairs in or around the home? No  If so, are there any without handrails? No  Home free of loose throw rugs in walkways, pet beds, electrical cords, etc? Yes  Adequate lighting in your home to reduce risk of falls? Yes   ASSISTIVE DEVICES UTILIZED TO PREVENT FALLS:  Life alert? No  Use of a cane, walker or w/c? No  Grab bars in the bathroom? No  Shower chair or bench in shower? Yes  Elevated toilet seat or a handicapped toilet? Yes   TIMED UP AND GO:  Was the test performed? No .    Cognitive Function:  Normal cognitive status assessed by direct observation by this Nurse Health Advisor. No abnormalities found.       6CIT Screen 07/20/2020 04/22/2018 04/20/2017  What Year? 0 points 0 points 0 points  What month? 0 points 0 points 0 points  What time? 0 points 0 points 0 points  Count back from 20 0 points 0 points 0 points  Months in reverse 4 points 0 points -  Repeat phrase 6 points 0 points 0 points  Total Score 10 0 -    Immunizations Immunization History  Administered Date(s) Administered   Fluad Quad(high Dose 65+) 03/16/2019, 03/20/2020, 04/22/2021   Influenza Whole 04/09/2009   Influenza, High Dose Seasonal PF 04/06/2017, 04/22/2018   Influenza,inj,Quad PF,6+ Mos 04/24/2015   Influenza,inj,quad, With Preservative 04/06/2020   Influenza-Unspecified 03/07/2014, 04/06/2017   Moderna Sars-Covid-2 Vaccination 12/19/2019, 01/16/2020   Pneumococcal Conjugate-13 03/20/2020   Pneumococcal Polysaccharide-23 05/07/2021   Td 03/12/2005   Tdap 07/17/2016   Zoster, Live 07/17/2016    TDAP status: Up to date  Flu Vaccine status: Up to date  Pneumococcal vaccine status: Up to date  Covid-19 vaccine status: Information provided on how to obtain vaccines.   Qualifies for Shingles Vaccine? Yes   Zostavax completed Yes   Shingrix Completed?: No.    Education has been provided regarding the importance of this  vaccine. Patient has been advised to call insurance company to determine out of pocket expense if they have not yet received this vaccine. Advised may also receive vaccine at local pharmacy or Health Dept. Verbalized acceptance and understanding.  Screening Tests Health Maintenance  Topic Date Due   Zoster Vaccines- Shingrix (1 of 2) Never done   COVID-19 Vaccine (3 - Moderna risk series) 02/13/2020   FOOT EXAM  07/02/2021   HEMOGLOBIN A1C  01/03/2022   OPHTHALMOLOGY EXAM  04/18/2022   TETANUS/TDAP  07/17/2026   COLONOSCOPY (Pts 45-77yrs Insurance coverage will need to be  confirmed)  03/26/2028   Pneumonia Vaccine 17+ Years old  Completed   INFLUENZA VACCINE  Completed   Hepatitis C Screening  Completed   HPV VACCINES  Aged Out    Health Maintenance  Health Maintenance Due  Topic Date Due   Zoster Vaccines- Shingrix (1 of 2) Never done   COVID-19 Vaccine (3 - Moderna risk series) 02/13/2020   FOOT EXAM  07/02/2021    Colorectal cancer screening: Type of screening: Colonoscopy. Completed 2022. Repeat every 5 years  Lung Cancer Screening: (Low Dose CT Chest recommended if Age 65-80 years, 30 pack-year currently smoking OR have quit w/in 15years.) does not qualify.   Lung Cancer Screening Referral:   Additional Screening:  Hepatitis C Screening: does not qualify; Completed 2018  Vision Screening: Recommended annual ophthalmology exams for early detection of glaucoma and other disorders of the eye. Is the patient up to date with their annual eye exam?  Yes  Who is the provider or what is the name of the office in which the patient attends annual eye exams? Dr. Zadie Rhine If pt is not established with a provider, would they like to be referred to a provider to establish care? No .   Dental Screening: Recommended annual dental exams for proper oral hygiene  Community Resource Referral / Chronic Care Management: CRR required this visit?  No   CCM required this visit?  No       Plan:     I have personally reviewed and noted the following in the patients chart:   Medical and social history Use of alcohol, tobacco or illicit drugs  Current medications and supplements including opioid prescriptions. Patient is not currently taking opioid prescriptions. Functional ability and status Nutritional status Physical activity Advanced directives List of other physicians Hospitalizations, surgeries, and ER visits in previous 12 months Vitals Screenings to include cognitive, depression, and falls Referrals and appointments  In addition, I have reviewed and discussed with patient certain preventive protocols, quality metrics, and best practice recommendations. A written personalized care plan for preventive services as well as general preventive health recommendations were provided to patient.     Leroy Kennedy, LPN   [id number]/[id number]   Nurse Notes:

## 2021-07-24 NOTE — Progress Notes (Signed)
° °  [name]  HOME SLEEP TEST (Watch PAT) REPORT  STUDY DATE: 07/22/2021  DOB: 02-27-1951  MRN: [id number]  ORDERING CLINICIAN: [name], MD, PhD   REFERRING CLINICIAN: [name], NP  CLINICAL INFORMATION/HISTORY: 71 year old with a history of stroke, reflux disease, diabetes, venous insufficiency, asthma, arthritis, hypertension, hyperlipidemia, coronary artery disease, and overweight state, who presents for reevaluation of his obstructive sleep apnea.  BMI: 30 kg/m  FINDINGS:   Sleep Summary:   Total Recording Time (hours, min): 9 hours, 31 minutes  Total Sleep Time (hours, min):  8 hours, 37 minutes   Percent REM (%):    12.4%   Respiratory Indices:   Calculated pAHI (per hour):  35.8/hour         REM pAHI:    50.3/hour       NREM pAHI: 33.7/hour  Oxygen Saturation Statistics:    Oxygen Saturation (%) Mean: 94%   Minimum oxygen saturation (%):                 82%   O2 Saturation Range (%): 82-98%    O2 Saturation (minutes) <=88%: 0 min  Pulse Rate Statistics:   Pulse Mean (bpm):    68/min    Pulse Range (60-82/min)   IMPRESSION: OSA (obstructive sleep apnea)   RECOMMENDATION:   This home sleep test demonstrates severe obstructive sleep apnea with a total AHI of 35.8/hour and O2 nadir of 82%.  Ongoing treatment with positive airway pressure is highly recommended.  The patient should qualify for a new PAP machine. I will prescribe an autoPAP machine for home use. A laboratory attended titration study can be considered in the future for optimization of his treatment and better tolerance of therapy.  Alternative treatment options are limited secondary to the severity of the patient's sleep disordered breathing.  Please note, that untreated obstructive sleep apnea may carry additional perioperative morbidity. Patients with significant obstructive sleep apnea should receive perioperative PAP therapy and the surgeons and particularly the  anesthesiologist should be informed of the diagnosis and the severity of the sleep disordered breathing. The patient should be cautioned not to drive, work at heights, or operate dangerous or heavy equipment when tired or sleepy. Review and reiteration of good sleep hygiene measures should be pursued with any patient. Other causes of the patient's symptoms, including circadian rhythm disturbances, an underlying mood disorder, medication effect and/or an underlying medical problem cannot be ruled out based on this test. Clinical correlation is recommended. The patient and his referring provider will be notified of the test results. The patient will be seen in follow up in sleep clinic at [facility].  I certify that I have reviewed the raw data recording prior to the issuance of this report in accordance with the standards of the American Academy of Sleep Medicine (AASM).   INTERPRETING PHYSICIAN:   [name], MD, PhD  Board Certified in Neurology and Sleep Medicine  [facility] [address], [place], [place] [address] [phone]

## 2021-07-24 NOTE — Patient Instructions (Signed)
Andrew Cobb , Thank you for taking time to come for your Medicare Wellness Visit. I appreciate your ongoing commitment to your health goals. Please review the following plan we discussed and let me know if I can assist you in the future.   Screening recommendations/referrals: Colonoscopy: up to date Recommended yearly ophthalmology/optometry visit for glaucoma screening and checkup Recommended yearly dental visit for hygiene and checkup  Vaccinations: Influenza vaccine: up to date Pneumococcal vaccine: up to date Tdap vaccine: up to date Shingles vaccine: Education provided    Advanced directives: on file  Conditions/risks identified:   Next appointment: 10-04-2021 8:20 Andrew Cobb  Preventive Care 72 Years and Older, Male Preventive care refers to lifestyle choices and visits with your health care provider that can promote health and wellness. What does preventive care include? A yearly physical exam. This is also called an annual well check. Dental exams once or twice a year. Routine eye exams. Ask your health care provider how often you should have your eyes checked. Personal lifestyle choices, including: Daily care of your teeth and gums. Regular physical activity. Eating a healthy diet. Avoiding tobacco and drug use. Limiting alcohol use. Practicing safe sex. Taking low doses of aspirin every day. Taking vitamin and mineral supplements as recommended by your health care provider. What happens during an annual well check? The services and screenings done by your health care provider during your annual well check will depend on your age, overall health, lifestyle risk factors, and family history of disease. Counseling  Your health care provider may ask you questions about your: Alcohol use. Tobacco use. Drug use. Emotional well-being. Home and relationship well-being. Sexual activity. Eating habits. History of falls. Memory and ability to understand (cognition). Work and  work Statistician. Screening  You may have the following tests or measurements: Height, weight, and BMI. Blood pressure. Lipid and cholesterol levels. These may be checked every 5 years, or more frequently if you are over 22 years old. Skin check. Lung cancer screening. You may have this screening every year starting at age 76 if you have a 30-pack-year history of smoking and currently smoke or have quit within the past 15 years. Fecal occult blood test (FOBT) of the stool. You may have this test every year starting at age 61. Flexible sigmoidoscopy or colonoscopy. You may have a sigmoidoscopy every 5 years or a colonoscopy every 10 years starting at age 50. Prostate cancer screening. Recommendations will vary depending on your family history and other risks. Hepatitis C blood test. Hepatitis B blood test. Sexually transmitted disease (STD) testing. Diabetes screening. This is done by checking your blood sugar (glucose) after you have not eaten for a while (fasting). You may have this done every 1-3 years. Abdominal aortic aneurysm (AAA) screening. You may need this if you are a current or former smoker. Osteoporosis. You may be screened starting at age 74 if you are at high risk. Talk with your health care provider about your test results, treatment options, and if necessary, the need for more tests. Vaccines  Your health care provider may recommend certain vaccines, such as: Influenza vaccine. This is recommended every year. Tetanus, diphtheria, and acellular pertussis (Tdap, Td) vaccine. You may need a Td booster every 10 years. Zoster vaccine. You may need this after age 57. Pneumococcal 13-valent conjugate (PCV13) vaccine. One dose is recommended after age 62. Pneumococcal polysaccharide (PPSV23) vaccine. One dose is recommended after age 79. Talk to your health care provider about which screenings and vaccines you  need and how often you need them. This information is not intended to  replace advice given to you by your health care provider. Make sure you discuss any questions you have with your health care provider. Document Released: 07/20/2015 Document Revised: 03/12/2016 Document Reviewed: 04/24/2015 Elsevier Interactive Patient Education  2017 [place] Prevention in the Home Falls can cause injuries. They can happen to people of all ages. There are many things you can do to make your home safe and to help prevent falls. What can I do on the outside of my home? Regularly fix the edges of walkways and driveways and fix any cracks. Remove anything that might make you trip as you walk through a door, such as a raised step or threshold. Trim any bushes or trees on the path to your home. Use bright outdoor lighting. Clear any walking paths of anything that might make someone trip, such as rocks or tools. Regularly check to see if handrails are loose or broken. Make sure that both sides of any steps have handrails. Any raised decks and porches should have guardrails on the edges. Have any leaves, snow, or ice cleared regularly. Use sand or salt on walking paths during winter. Clean up any spills in your garage right away. This includes oil or grease spills. What can I do in the bathroom? Use night lights. Install grab bars by the toilet and in the tub and shower. Do not use towel bars as grab bars. Use non-skid mats or decals in the tub or shower. If you need to sit down in the shower, use a plastic, non-slip stool. Keep the floor dry. Clean up any water that spills on the floor as soon as it happens. Remove soap buildup in the tub or shower regularly. Attach bath mats securely with double-sided non-slip rug tape. Do not have throw rugs and other things on the floor that can make you trip. What can I do in the bedroom? Use night lights. Make sure that you have a light by your bed that is easy to reach. Do not use any sheets or blankets that are too big for  your bed. They should not hang down onto the floor. Have a firm chair that has side arms. You can use this for support while you get dressed. Do not have throw rugs and other things on the floor that can make you trip. What can I do in the kitchen? Clean up any spills right away. Avoid walking on wet floors. Keep items that you use a lot in easy-to-reach places. If you need to reach something above you, use a strong step stool that has a grab bar. Keep electrical cords out of the way. Do not use floor polish or wax that makes floors slippery. If you must use wax, use non-skid floor wax. Do not have throw rugs and other things on the floor that can make you trip. What can I do with my stairs? Do not leave any items on the stairs. Make sure that there are handrails on both sides of the stairs and use them. Fix handrails that are broken or loose. Make sure that handrails are as long as the stairways. Check any carpeting to make sure that it is firmly attached to the stairs. Fix any carpet that is loose or worn. Avoid having throw rugs at the top or bottom of the stairs. If you do have throw rugs, attach them to the floor with carpet tape. Make sure that  you have a light switch at the top of the stairs and the bottom of the stairs. If you do not have them, ask someone to add them for you. What else can I do to help prevent falls? Wear shoes that: Do not have high heels. Have rubber bottoms. Are comfortable and fit you well. Are closed at the toe. Do not wear sandals. If you use a stepladder: Make sure that it is fully opened. Do not climb a closed stepladder. Make sure that both sides of the stepladder are locked into place. Ask someone to hold it for you, if possible. Clearly mark and make sure that you can see: Any grab bars or handrails. First and last steps. Where the edge of each step is. Use tools that help you move around (mobility aids) if they are needed. These  include: Canes. Walkers. Scooters. Crutches. Turn on the lights when you go into a dark area. Replace any light bulbs as soon as they burn out. Set up your furniture so you have a clear path. Avoid moving your furniture around. If any of your floors are uneven, fix them. If there are any pets around you, be aware of where they are. Review your medicines with your doctor. Some medicines can make you feel dizzy. This can increase your chance of falling. Ask your doctor what other things that you can do to help prevent falls. This information is not intended to replace advice given to you by your health care provider. Make sure you discuss any questions you have with your health care provider. Document Released: 04/19/2009 Document Revised: 11/29/2015 Document Reviewed: 07/28/2014 Elsevier Interactive Patient Education  2017 [employer].

## 2021-07-25 DIAGNOSIS — I10 Essential (primary) hypertension: Secondary | ICD-10-CM | POA: Diagnosis not present

## 2021-07-25 DIAGNOSIS — I4891 Unspecified atrial fibrillation: Secondary | ICD-10-CM | POA: Diagnosis not present

## 2021-07-26 ENCOUNTER — Ambulatory Visit: Payer: PPO

## 2021-07-29 ENCOUNTER — Other Ambulatory Visit: Payer: Self-pay | Admitting: Nurse Practitioner

## 2021-07-29 MED ORDER — EMPAGLIFLOZIN 10 MG PO TABS
10.0000 mg | ORAL_TABLET | Freq: Every day | ORAL | 4 refills | Status: DC
Start: 2021-07-29 — End: 2021-11-01

## 2021-07-29 NOTE — Telephone Encounter (Signed)
RX signed by Henrine Screws and faxed in.  Called and let the patient know that this was done for him.

## 2021-07-29 NOTE — Addendum Note (Signed)
Addended by: [name] on: 07/29/2021 05:34 PM   Modules accepted: Orders

## 2021-07-29 NOTE — Telephone Encounter (Signed)
Pt called and stated that he will need another year supply of empagliflozin (JARDIANCE) 10 MG TABS tablet He states that he gets this medication from [place] [facility]. Patient states that Jolene would know what to do.

## 2021-07-29 NOTE — Telephone Encounter (Signed)
Routing to provider for printed RX. Fax to [facility] at [phone].

## 2021-07-29 NOTE — Procedures (Signed)
° °  [name]  HOME SLEEP TEST (Watch PAT) REPORT  STUDY DATE: 07/22/2021  DOB: 1950-09-16  MRN: [id number]  ORDERING CLINICIAN: [name], MD, PhD   REFERRING CLINICIAN: [name], NP  CLINICAL INFORMATION/HISTORY: 71 year old with a history of stroke, reflux disease, diabetes, venous insufficiency, asthma, arthritis, hypertension, hyperlipidemia, coronary artery disease, and overweight state, who presents for reevaluation of his obstructive sleep apnea.  BMI: 30 kg/m  FINDINGS:   Sleep Summary:   Total Recording Time (hours, min): 9 hours, 31 minutes  Total Sleep Time (hours, min):  8 hours, 37 minutes   Percent REM (%):    12.4%   Respiratory Indices:   Calculated pAHI (per hour):  35.8/hour         REM pAHI:    50.3/hour       NREM pAHI: 33.7/hour  Oxygen Saturation Statistics:    Oxygen Saturation (%) Mean: 94%   Minimum oxygen saturation (%):                 82%   O2 Saturation Range (%): 82-98%    O2 Saturation (minutes) <=88%: 0 min  Pulse Rate Statistics:   Pulse Mean (bpm):    68/min    Pulse Range (60-82/min)   IMPRESSION: OSA (obstructive sleep apnea)   RECOMMENDATION:   This home sleep test demonstrates severe obstructive sleep apnea with a total AHI of 35.8/hour and O2 nadir of 82%.  Ongoing treatment with positive airway pressure is highly recommended.  The patient should qualify for a new PAP machine. I will prescribe an autoPAP machine for home use. A laboratory attended titration study can be considered in the future for optimization of his treatment and better tolerance of therapy.  Alternative treatment options are limited secondary to the severity of the patient's sleep disordered breathing.  Please note, that untreated obstructive sleep apnea may carry additional perioperative morbidity. Patients with significant obstructive sleep apnea should receive perioperative PAP therapy and the surgeons and particularly the  anesthesiologist should be informed of the diagnosis and the severity of the sleep disordered breathing. The patient should be cautioned not to drive, work at heights, or operate dangerous or heavy equipment when tired or sleepy. Review and reiteration of good sleep hygiene measures should be pursued with any patient. Other causes of the patient's symptoms, including circadian rhythm disturbances, an underlying mood disorder, medication effect and/or an underlying medical problem cannot be ruled out based on this test. Clinical correlation is recommended. The patient and his referring provider will be notified of the test results. The patient will be seen in follow up in sleep clinic at [facility].  I certify that I have reviewed the raw data recording prior to the issuance of this report in accordance with the standards of the American Academy of Sleep Medicine (AASM).   INTERPRETING PHYSICIAN:   [name], MD, PhD  Board Certified in Neurology and Sleep Medicine  [facility] [address], [place], [place] [address] [phone]

## 2021-07-30 ENCOUNTER — Telehealth: Payer: Self-pay

## 2021-07-30 NOTE — Telephone Encounter (Signed)
I called and relayed the results of the HST.  They have used [facility] for DME.  She will call insurance to see what DME is in network and then call us back. I gave her [facility] they have used but also [facility].

## 2021-07-30 NOTE — Telephone Encounter (Signed)
Pt's wife, [name], called back. She said [facility] is in network. She said [facility] is "neither in nor out" of network and if they use [facility] they will be responsible for 100% of the cost. Asking for [facility]. I let her know the order will be sent to [facility].   Order sent to [facility].

## 2021-07-30 NOTE — Telephone Encounter (Signed)
I called patient to discuss. No answer, left a message asking him to call me back. If patient calls back after today, please send to POD 4 calls.

## 2021-07-30 NOTE — Telephone Encounter (Signed)
Pt's wife is asking for a call back on her cell#[phone]

## 2021-07-30 NOTE — Telephone Encounter (Signed)
Spoke to [place], wife.  She will call when pt gets new machine to make appt.  I relayed will send message to [facility] and I gave her # too [phone] to call after a week if she does not hear anything.  And will let her know when [facility] responds back..   message sent to [facility].

## 2021-07-30 NOTE — Telephone Encounter (Signed)
-----   Message from [name], MD sent at 07/29/2021  5:34 PM EST ----- Patient referred by primary care for his sleep apnea and saw me in June 2022.  Recently saw [name] on 07/20/2021, at which time a new CPAP was discussed.  He had a home sleep test for reevaluation on 07/22/2021.  Please call and notify the patient that the recent home sleep test confirmed his diagnosis of sleep apnea in the severe range.  I would like for him to start using a new machine called AutoPap, I will prescribe for new equipment and we can send it to his current DME company.  He will need a follow-up within 3 months of starting treatment, once he has a machine he will need to notify us and we will arrange for the follow-up with the nurse practitioner or myself.  Please reinforce ongoing good compliance with treatment.  [name], MD, PhD [name])

## 2021-07-31 NOTE — Telephone Encounter (Signed)
[name], RN; [name] got it      Previous Messages   ----- Message -----  From: Brandon Melnick, RN  Sent: 07/30/2021   4:18 PM EST  To: [name], [name], *  Subject: new autopap                                     Hi [name] new order in epic for this pt.     Andrew Cobb. [place]  Male, 71 y.o., September 01, 1950  MRN:  [id number]   Recently saw [name] on 07/20/2021, at which time a new CPAP was discussed.  He had a home sleep test for reevaluation on 07/22/2021.     The recent home sleep test confirmed his diagnosis of sleep apnea in the severe range.  I would like for him to start using a new machine called AutoPap, I will prescribe for new equipment and we can send it to his current DME company.  [name], MD, PhD  [name])     thanks [facility]   I called wife and relayed that I did get response back from them at [facility]. She was given phone # [phone] and will f/u in a week if has not heard from them.

## 2021-08-01 NOTE — Telephone Encounter (Signed)
Reviewed Media tab - Jardiance application previously sent to [facility] is complete. Resent to [facility].

## 2021-08-06 DIAGNOSIS — I152 Hypertension secondary to endocrine disorders: Secondary | ICD-10-CM

## 2021-08-06 DIAGNOSIS — E1159 Type 2 diabetes mellitus with other circulatory complications: Secondary | ICD-10-CM

## 2021-08-06 DIAGNOSIS — E1165 Type 2 diabetes mellitus with hyperglycemia: Secondary | ICD-10-CM

## 2021-08-06 DIAGNOSIS — E785 Hyperlipidemia, unspecified: Secondary | ICD-10-CM

## 2021-08-06 DIAGNOSIS — E1169 Type 2 diabetes mellitus with other specified complication: Secondary | ICD-10-CM

## 2021-08-08 NOTE — Telephone Encounter (Signed)
I sent message to [place] at [facility].

## 2021-08-08 NOTE — Telephone Encounter (Signed)
Pt's wife called stating that the DME informed them that they have not received the Rx for the new cpap machine. She would like papers refaxed to the DME.

## 2021-08-12 ENCOUNTER — Ambulatory Visit (INDEPENDENT_AMBULATORY_CARE_PROVIDER_SITE_OTHER): Payer: PPO

## 2021-08-12 DIAGNOSIS — I4819 Other persistent atrial fibrillation: Secondary | ICD-10-CM | POA: Diagnosis not present

## 2021-08-12 NOTE — Telephone Encounter (Signed)
I called Lovina Reach with [facility].  They do have order.  She will call them.

## 2021-08-13 ENCOUNTER — Encounter: Payer: Self-pay | Admitting: *Deleted

## 2021-08-13 LAB — CUP PACEART REMOTE DEVICE CHECK
Date Time Interrogation Session: 20230205230741
Implantable Pulse Generator Implant Date: 20200316

## 2021-08-13 NOTE — Telephone Encounter (Signed)
[name], RN; [name] Got it. Just waiting for the Auth to come back.      Previous Messages   ----- Message -----  From: Brandon Melnick, RN  Sent: 08/08/2021   2:44 PM EST  To: [name], [name], *   Hi,  [name] can you check on this for me?  Thank you.  Sandy RN   Pt's wife called stating that the DME informed them that they have not received the Rx for the new cpap machine. She would like papers refaxed to the DME.

## 2021-08-15 NOTE — Progress Notes (Signed)
Carelink Summary Report / Loop Recorder 

## 2021-08-27 DIAGNOSIS — J189 Pneumonia, unspecified organism: Secondary | ICD-10-CM | POA: Diagnosis not present

## 2021-08-27 DIAGNOSIS — G4733 Obstructive sleep apnea (adult) (pediatric): Secondary | ICD-10-CM | POA: Diagnosis not present

## 2021-08-27 DIAGNOSIS — I2510 Atherosclerotic heart disease of native coronary artery without angina pectoris: Secondary | ICD-10-CM | POA: Diagnosis not present

## 2021-08-28 ENCOUNTER — Encounter (INDEPENDENT_AMBULATORY_CARE_PROVIDER_SITE_OTHER): Payer: Self-pay | Admitting: Ophthalmology

## 2021-08-28 ENCOUNTER — Ambulatory Visit (INDEPENDENT_AMBULATORY_CARE_PROVIDER_SITE_OTHER): Payer: PPO | Admitting: Ophthalmology

## 2021-08-28 ENCOUNTER — Other Ambulatory Visit: Payer: Self-pay

## 2021-08-28 DIAGNOSIS — H353211 Exudative age-related macular degeneration, right eye, with active choroidal neovascularization: Secondary | ICD-10-CM

## 2021-08-28 DIAGNOSIS — H35721 Serous detachment of retinal pigment epithelium, right eye: Secondary | ICD-10-CM | POA: Diagnosis not present

## 2021-08-28 MED ORDER — AFLIBERCEPT 2MG/0.05ML IZ SOLN FOR KALEIDOSCOPE
2.0000 mg | INTRAVITREAL | Status: AC | PRN
  Administered 2021-08-28: 2 mg via INTRAVITREAL

## 2021-08-28 NOTE — Assessment & Plan Note (Signed)
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

## 2021-08-28 NOTE — Progress Notes (Signed)
08/28/2021     CHIEF COMPLAINT Patient presents for  Chief Complaint  Patient presents with   Macular Degeneration      HISTORY OF PRESENT ILLNESS: Andrew Cobb is a 71 y.o. male who presents to the clinic today for:   HPI   No changes to medical history this past year.  No interval change in acuity Last edited by Hurman Horn, MD on 08/28/2021 10:40 AM.      Referring physician: [name], NP [place],  [place] [address]  HISTORICAL INFORMATION:   Selected notes from the MEDICAL RECORD NUMBER    Lab Results  Component Value Date   HGBA1C 6.9 (H) 07/05/2021     CURRENT MEDICATIONS: No current outpatient medications on file. (Ophthalmic Drugs)   No current facility-administered medications for this visit. (Ophthalmic Drugs)   Current Outpatient Medications (Other)  Medication Sig   acyclovir (ZOVIRAX) 400 MG tablet Take 1 tablet (400 mg total) by mouth 2 (two) times daily.   albuterol (VENTOLIN HFA) 108 (90 Base) MCG/ACT inhaler Inhale 2 puffs into the lungs every 6 (six) hours as needed for wheezing or shortness of breath.   amitriptyline (ELAVIL) 25 MG tablet Take 1 tablet (25 mg total) by mouth at bedtime.   apixaban (ELIQUIS) 5 MG TABS tablet Take 1 tablet (5 mg total) by mouth 2 (two) times daily.   atorvastatin (LIPITOR) 40 MG tablet Take 1 tablet (40 mg total) by mouth at bedtime.   betamethasone dipropionate (DIPROLENE) 0.05 % ointment Apply 1 application topically 2 (two) times daily as needed for rash.   cetirizine (ZYRTEC) 10 MG tablet Take 10 mg by mouth daily.   Cholecalciferol (VITAMIN D) 2000 units CAPS Take 2,000 Units by mouth daily.   clobetasol ointment (TEMOVATE) 0.10 % Apply 1 application topically 2 (two) times daily as needed (rash).   Cyanocobalamin (B-12) 5000 MCG CAPS Take 5,000 mcg by mouth every other day.   empagliflozin (JARDIANCE) 10 MG TABS tablet Take 1 tablet (10 mg total) by mouth daily before breakfast.    fluticasone (FLONASE) 50 MCG/ACT nasal spray Place 2 sprays into both nostrils daily.   glucose blood (ONETOUCH ULTRA) test strip 1 each by Other route 2 (two) times daily. Use as instructed   Lancets (ONETOUCH ULTRASOFT) lancets USE 1  TO CHECK GLUCOSE TWICE DAILY   levETIRAcetam (KEPPRA) 500 MG tablet Take 1 tablet (500 mg total) by mouth 2 (two) times daily.   metFORMIN (GLUCOPHAGE) 1000 MG tablet Take 1 tablet (1,000 mg total) by mouth 2 (two) times daily with a meal.   Multiple Vitamins-Minerals (MULTIVITAMIN PO) Take 1 tablet by mouth daily.    tamsulosin (FLOMAX) 0.4 MG CAPS capsule Take 1 capsule (0.4 mg total) by mouth at bedtime.   telmisartan (MICARDIS) 80 MG tablet Take 0.5 tablets (40 mg total) by mouth daily.   No current facility-administered medications for this visit. (Other)      REVIEW OF SYSTEMS: ROS   Negative for: Constitutional, Gastrointestinal, Neurological, Skin, Genitourinary, Musculoskeletal, HENT, Endocrine, Cardiovascular, Eyes, Respiratory, Psychiatric, Allergic/Imm, Heme/Lymph Last edited by Silvestre Moment on 08/28/2021 10:12 AM.       ALLERGIES Allergies  Allergen Reactions   Succinylsulphathiazole Rash   Sulfamethoxazole-Trimethoprim Rash   Tetracyclines & Related Rash    PAST MEDICAL HISTORY Past Medical History:  Diagnosis Date   Arthritis    Asthma    has not needed since 6/21   Cancer ([place])  prostate   Chronic venous insufficiency    varicose vein lower extremity with inflammation   Coronary artery disease 1996   two stents placed    Diabetes mellitus without complication (HCC)    type 2 on metformin   GERD (gastroesophageal reflux disease)    no issues since gastric bypass surgery as stated per pt   Hyperlipidemia    Hypertension    Hypogonadism in male    MRSA (methicillin resistant Staphylococcus aureus) infection    07/30/2008 thru 08/07/2008  abdominal abcess   Sleep apnea    on BIPAP   Stented coronary artery    Stroke ([place])  09/07/2019   no defecits   Thrombocythemia    Past Surgical History:  Procedure Laterality Date   ANGIOPLASTY     ANGIOPLASTY     with stent 04/07/1995   ANTERIOR CERVICAL DECOMPRESSION/DISCECTOMY FUSION 4 LEVELS N/A 08/17/2017   Procedure: Anterior discectomy with fusion and plate fixation Cervical Three-Four, Four-Five, Five-Six, and Six-Seven Fusion;  Surgeon: Ditty, Kevan Ny, MD;  Location: [facility];  Service: Neurosurgery;  Laterality: N/A;  Anterior discectomy with fusion and plate fixation Cervical Three-Four, Four-Five, Five-Six, and Six-Seven Fusion    APPENDECTOMY     1966   BUBBLE STUDY  09/20/2018   Procedure: BUBBLE STUDY;  Surgeon: Josue Hector, MD;  Location: [facility] ENDOSCOPY;  Service: Cardiovascular;;   [place] TEST     07/31/2011   CARDIOVERSION N/A 07/04/2021   Procedure: CARDIOVERSION;  Surgeon: [name], MD;  Location: [facility] ENDOSCOPY;  Service: Cardiovascular;  Laterality: N/A;   CARPAL TUNNEL RELEASE Left    CHOLECYSTECTOMY     2006   COLONOSCOPY WITH PROPOFOL N/A 03/26/2021   Procedure: COLONOSCOPY WITH PROPOFOL;  Surgeon: Jonathon Bellows, MD;  Location: [facility] END[facility]COPY;  Service: Gastroenterology;  Laterality: N/A;   colonscopy      08/25/2012   EYE SURGERY Bilateral    cataract   FUNCTIONAL ENDOSCOPIC SINUS SURGERY     11/10/2013   GASTRIC BYPASS     10/05/2012   HERNIA REPAIR     left inguinal 1981   INCISION AND DRAINAGE ABSCESS Right 02/26/2017   Procedure: INCISION AND DRAINAGE ABSCESS;  Surgeon: Nickie Retort, MD;  Location: [facility] ORS;  Service: Urology;  Laterality: Right;   JOINT REPLACEMENT     bilateral   left ankle surgery      05/03/2003    left carpel tunnel      09/18/1993   left knee meniscal tear      01/25/2010   left knee meniscal tear repair      05/04/1996   left rotator cuff repair      05/03/2003    LOOP RECORDER INSERTION N/A 09/20/2018   Procedure: LOOP RECORDER INSERTION;   Surgeon: Evans Lance, MD;  Location: [facility] CV LAB;  Service: Cardiovascular;  Laterality: N/A;   REPLACEMENT TOTAL KNEE BILATERAL  07/13/2015   REVERSE SHOULDER ARTHROPLASTY Left 08/30/2020   Procedure: REVERSE SHOULDER ARTHROPLASTY;  Surgeon: Justice Britain, MD;  Location: [facility] ORS;  Service: Orthopedics;  Laterality: Left;  1101min   right ankle surgery      fracture has 2 screws 07/07/1997   right carpel tunnel      05/16/1992   right shoulder replacement      01/27/2006   SCROTAL EXPLORATION Right 02/26/2017   Procedure: SCROTUM EXPLORATION;  Surgeon: Nickie Retort, MD;  Location: [facility] ORS;  Service: Urology;  Laterality: Right;   TEE WITHOUT CARDIOVERSION N/A 09/20/2018   Procedure: TRANSESOPHAGEAL ECHOCARDIOGRAM (TEE);  Surgeon: Josue Hector, MD;  Location: [facility] ENDOSCOPY;  Service: Cardiovascular;  Laterality: N/A;   TOTAL KNEE ARTHROPLASTY Left 07/13/2015   Procedure: LEFT TOTAL KNEE ARTHROPLASTY;  Surgeon: Gaynelle Arabian, MD;  Location: [facility] ORS;  Service: Orthopedics;  Laterality: Left;    FAMILY HISTORY Family History  Problem Relation Age of Onset   Cancer Mother        pancreatic   Diabetes Mother    Stroke Mother    Heart disease Mother    Hyperlipidemia Mother    Hypertension Mother    Heart attack Mother    Heart disease Father    Stroke Father    Diabetes Father    Hypertension Father    Heart attack Father    Hyperlipidemia Father    Pancreatic cancer Father    Cancer Sister    Cancer Brother        lung   Cancer Brother    Kidney cancer Neg Hx    Bladder Cancer Neg Hx    Prostate cancer Neg Hx     SOCIAL HISTORY Social History   Tobacco Use   Smoking status: Former    Packs/day: 1.00    Years: 10.00    Pack years: 10.00    Types: Cigarettes    Quit date: 07/07/1984    Years since quitting: 37.1   Smokeless tobacco: Never   Tobacco comments:    Former Smoker quit 1986 07/23/2021  Vaping Use   Vaping Use: Never used  Substance Use Topics    Alcohol use: No    Alcohol/week: 0.0 standard drinks   Drug use: No         OPHTHALMIC EXAM:  Base Eye Exam     Visual Acuity (Snellen - Linear)       Right Left   Dist [place] 20/40 -3 20/20   Dist cc 20/20 -2          Tonometry (Tonopen, 10:17 AM)       Right Left   Pressure 12 15         Pupils       Pupils APD   Right PERRL None   Left PERRL None         Visual Fields       Left Right    Full Full         Extraocular Movement       Right Left    Full, Ortho Full, Ortho         Neuro/Psych     Oriented x3: Yes   Mood/Affect: Normal         Dilation     Right eye: 1.0% Mydriacyl, 2.5% Phenylephrine @ 10:39 AM           Slit Lamp and Fundus Exam     External Exam       Right Left   External Normal Normal         Slit Lamp Exam       Right Left   Lids/Lashes Normal Normal   Conjunctiva/Sclera White and quiet White and quiet   Cornea Clear Clear   Anterior Chamber Deep and quiet Deep and quiet   Iris Round and reactive Round and reactive   Lens Posterior chamber intraocular lens Posterior chamber intraocular lens   Anterior Vitreous Normal Normal         Fundus  Exam       Right Left   Posterior Vitreous Posterior vitreous detachment, Central vitreous floaters    Disc Normal    C/D Ratio 0.6    Macula Hard drusen, no exudates, Macular thickening, Soft drusen, Pigmented atrophy, Retinal pigment epithelial mottling, Retinal pigment epithelial detachment    Vessels Normal, , no DR    Periphery Normal             IMAGING AND PROCEDURES  Imaging and Procedures for 08/28/21  OCT, Retina - OU - Both Eyes       Right Eye Quality was good. Scan locations included subfoveal. Central Foveal Thickness: 347. Progression has been stable. Findings include abnormal foveal contour, subretinal fluid, pigment epithelial detachment, no IRF.   Left Eye Quality was good. Scan locations included subfoveal. Central Foveal  Thickness: 289. Progression has been stable. Findings include retinal drusen , abnormal foveal contour, no SRF, no IRF.   Notes Today OD at 6 week interval, small change with increased serous retinal detachment adjacent to the vascularized pigment epithelial detachment nasal to the FAZ.  We will repeat injection today Eylea today next and examination again in 6 weeks,      Intravitreal Injection, Pharmacologic Agent - OD - Right Eye       Time Out 08/28/2021. 10:38 AM. Confirmed correct patient, procedure, site, and patient consented.   Anesthesia Topical anesthesia was used. Anesthetic medications included Lidocaine 4%.   Procedure Preparation included Tobramycin 0.3%, 10% betadine to eyelids, 5% betadine to ocular surface. A 30 gauge needle was used.   Injection: 2 mg aflibercept 2 MG/0.05ML   Route: Intravitreal, Site: Right Eye   NDC: [id number], Lot: [id number], Waste: 0 mL   Post-op Post injection exam found visual acuity of at least counting fingers. The patient tolerated the procedure well. There were no complications. The patient received written and verbal post procedure care education. Post injection medications included ocuflox.              ASSESSMENT/PLAN:  Serous detachment of retinal pigment epithelium of right eye Component of wet AMD stable over the last 6 weeks  Exudative age-related macular degeneration of right eye with active choroidal neovascularization ([place]) On Eylea now currently at 6-week interval.  We will repeat injection today and maintain 6-week interval until condition improves     ICD-10-CM   1. Exudative age-related macular degeneration of right eye with active choroidal neovascularization (HCC)  H35.3211 OCT, Retina - OU - Both Eyes    Intravitreal Injection, Pharmacologic Agent - OD - Right Eye    aflibercept (EYLEA) SOLN 2 mg    2. Serous detachment of retinal pigment epithelium of right eye  H35.721       1.  OD with chronic  active subfoveal serous epithelial detachment component of wet AMD.  Stabilized on intravitreal Eylea now yet still not resolving.  Repeat Eylea today and examination next in 6 weeks  2.  3.  Ophthalmic Meds Ordered this visit:  Meds ordered this encounter  Medications   aflibercept (EYLEA) SOLN 2 mg       Return in about 6 weeks (around 10/09/2021) for dilate, OD, EYLEA OCT.  There are no Patient Instructions on file for this visit.   Explained the diagnoses, plan, and follow up with the patient and they expressed understanding.  Patient expressed understanding of the importance of proper follow up care.   [name] Burch Marchuk M.D. Diseases & Surgery of the Retina and Vitreous  [place] 08/28/21     Abbreviations: M myopia (nearsighted); A astigmatism; H hyperopia (farsighted); P presbyopia; Mrx spectacle prescription;  CTL contact lenses; OD right eye; OS left eye; OU both eyes  XT exotropia; ET esotropia; PEK punctate epithelial keratitis; PEE punctate epithelial erosions; DES dry eye syndrome; MGD meibomian gland dysfunction; ATs artificial tears; PFAT's preservative free artificial tears; [place] nuclear sclerotic cataract; PSC posterior subcapsular cataract; ERM epi-retinal membrane; PVD posterior vitreous detachment; RD retinal detachment; DM diabetes mellitus; DR diabetic retinopathy; NPDR non-proliferative diabetic retinopathy; PDR proliferative diabetic retinopathy; CSME clinically significant macular edema; DME diabetic macular edema; dbh dot blot hemorrhages; CWS cotton wool spot; POAG primary open angle glaucoma; C/D cup-to-disc ratio; HVF humphrey visual field; GVF goldmann visual field; OCT optical coherence tomography; IOP intraocular pressure; BRVO Branch retinal vein occlusion; CRVO central retinal vein occlusion; CRAO central retinal artery occlusion; BRAO branch retinal artery occlusion; RT retinal tear; SB scleral buckle; PPV pars plana vitrectomy; VH  Vitreous hemorrhage; PRP panretinal laser photocoagulation; IVK intravitreal kenalog; VMT vitreomacular traction; MH Macular hole;  NVD neovascularization of the disc; NVE neovascularization elsewhere; AREDS age related eye disease study; ARMD age related macular degeneration; POAG primary open angle glaucoma; EBMD epithelial/anterior basement membrane dystrophy; ACIOL anterior chamber intraocular lens; IOL intraocular lens; PCIOL posterior chamber intraocular lens; Phaco/IOL phacoemulsification with intraocular lens placement; [place] photorefractive keratectomy; LASIK laser assisted in situ keratomileusis; HTN hypertension; DM diabetes mellitus; COPD chronic obstructive pulmonary disease

## 2021-08-28 NOTE — Assessment & Plan Note (Signed)
On Eylea now currently at 6-week interval.  We will repeat injection today and maintain 6-week interval until condition improves

## 2021-09-16 ENCOUNTER — Ambulatory Visit (INDEPENDENT_AMBULATORY_CARE_PROVIDER_SITE_OTHER): Payer: PPO

## 2021-09-16 DIAGNOSIS — R55 Syncope and collapse: Secondary | ICD-10-CM | POA: Diagnosis not present

## 2021-09-17 LAB — CUP PACEART REMOTE DEVICE CHECK
Date Time Interrogation Session: 20230312231857
Implantable Pulse Generator Implant Date: 20200316

## 2021-09-24 ENCOUNTER — Telehealth: Payer: Self-pay

## 2021-09-24 DIAGNOSIS — G4733 Obstructive sleep apnea (adult) (pediatric): Secondary | ICD-10-CM | POA: Diagnosis not present

## 2021-09-24 DIAGNOSIS — I2510 Atherosclerotic heart disease of native coronary artery without angina pectoris: Secondary | ICD-10-CM | POA: Diagnosis not present

## 2021-09-24 DIAGNOSIS — J189 Pneumonia, unspecified organism: Secondary | ICD-10-CM | POA: Diagnosis not present

## 2021-09-24 NOTE — Progress Notes (Signed)
? ? ?Chronic Care Management ?Pharmacy Assistant  ? ?Name: Andrew Cobb  MRN: [id number] DOB: August 02, 1950 ? ?Andrew Cobb is an 71 y.o. year old male who presents for his follow-up CCM visit with the clinical pharmacist. ? ?Reason for Encounter: Disease State ?  ?Conditions to be addressed/monitored: ?HTN ? ? ?Recent office visits:  ?None ID ? ?Recent consult visits:  ?08/28/21 Deloria Lair A, MD-Ophthalmology (Macular degeneration) aflibercept (EYLEA) SOLN 2 mg, Return in about 6 weeks  ? ?Hospital visits:  ?None since last coordination call ? ?Medications: ?Outpatient Encounter Medications as of 09/24/2021  ?Medication Sig  ? acyclovir (ZOVIRAX) 400 MG tablet Take 1 tablet (400 mg total) by mouth 2 (two) times daily.  ? albuterol (VENTOLIN HFA) 108 (90 Base) MCG/ACT inhaler Inhale 2 puffs into the lungs every 6 (six) hours as needed for wheezing or shortness of breath.  ? amitriptyline (ELAVIL) 25 MG tablet Take 1 tablet (25 mg total) by mouth at bedtime.  ? apixaban (ELIQUIS) 5 MG TABS tablet Take 1 tablet (5 mg total) by mouth 2 (two) times daily.  ? atorvastatin (LIPITOR) 40 MG tablet Take 1 tablet (40 mg total) by mouth at bedtime.  ? betamethasone dipropionate (DIPROLENE) 0.05 % ointment Apply 1 application topically 2 (two) times daily as needed for rash.  ? cetirizine (ZYRTEC) 10 MG tablet Take 10 mg by mouth daily.  ? Cholecalciferol (VITAMIN D) 2000 units CAPS Take 2,000 Units by mouth daily.  ? clobetasol ointment (TEMOVATE) 1.51 % Apply 1 application topically 2 (two) times daily as needed (rash).  ? Cyanocobalamin (B-12) 5000 MCG CAPS Take 5,000 mcg by mouth every other day.  ? empagliflozin (JARDIANCE) 10 MG TABS tablet Take 1 tablet (10 mg total) by mouth daily before breakfast.  ? fluticasone (FLONASE) 50 MCG/ACT nasal spray Place 2 sprays into both nostrils daily.  ? glucose blood (ONETOUCH ULTRA) test strip 1 each by Other route 2 (two) times daily. Use as instructed  ? Lancets (ONETOUCH  ULTRASOFT) lancets USE 1  TO CHECK GLUCOSE TWICE DAILY  ? levETIRAcetam (KEPPRA) 500 MG tablet Take 1 tablet (500 mg total) by mouth 2 (two) times daily.  ? metFORMIN (GLUCOPHAGE) 1000 MG tablet Take 1 tablet (1,000 mg total) by mouth 2 (two) times daily with a meal.  ? Multiple Vitamins-Minerals (MULTIVITAMIN PO) Take 1 tablet by mouth daily.   ? tamsulosin (FLOMAX) 0.4 MG CAPS capsule Take 1 capsule (0.4 mg total) by mouth at bedtime.  ? telmisartan (MICARDIS) 80 MG tablet Take 0.5 tablets (40 mg total) by mouth daily.  ? ?No facility-administered encounter medications on file as of 09/24/2021.  ? ?Recent Office Vitals: ?BP Readings from Last 3 Encounters:  ?07/23/21 136/76  ?07/12/21 (!) 156/82  ?07/10/21 127/76  ? ?Pulse Readings from Last 3 Encounters:  ?07/23/21 72  ?07/12/21 60  ?07/10/21 74  ?  ?Wt Readings from Last 3 Encounters:  ?07/23/21 202 lb 3.2 oz (91.7 kg)  ?07/12/21 203 lb 8 oz (92.3 kg)  ?07/10/21 203 lb (92.1 kg)  ?  ? ?Kidney Function ?Lab Results  ?Component Value Date/Time  ? CREATININE 1.27 07/05/2021 10:00 AM  ? CREATININE 1.11 07/04/2021 08:59 AM  ? CREATININE 0.99 07/29/2011 11:02 AM  ? GFR 49.88 (L) 10/14/2019 10:25 AM  ? GFRNONAA >60 07/04/2021 08:59 AM  ? GFRNONAA >60 07/29/2011 11:02 AM  ? GFRAA 67 08/27/2020 10:54 AM  ? GFRAA >60 07/29/2011 11:02 AM  ? ? ?BMP Latest Ref Rng & Units  07/05/2021 07/04/2021 05/06/2021  ?Glucose 70 - 99 mg/dL 274(H) 170(H) 99  ?BUN 8 - 27 mg/dL '20 18 27  '$ ?Creatinine 0.76 - 1.27 mg/dL 1.27 1.11 1.20  ?BUN/Creat Ratio 10 - 24 16 - 23  ?Sodium 134 - 144 mmol/L 139 135 136  ?Potassium 3.5 - 5.2 mmol/L 4.8 4.1 5.1  ?Chloride 96 - 106 mmol/L 101 103 99  ?CO2 20 - 29 mmol/L '22 23 24  '$ ?Calcium 8.6 - 10.2 mg/dL 9.0 9.0 8.9  ? ?Reviewed chart prior to disease state call. Unable to reach patient regarding BP ? ?Current antihypertensive regimen:  ?Telmisartan 80 mg ? ? ?What recent interventions/DTPs have been made by any provider to improve Blood Pressure control  since last CPP Visit: none noted since last coordination call ? ?Any recent hospitalizations or ED visits since last visit with CPP? No, not since last coordination call ? ? ?Adherence Review: ?Is the patient currently on ACE/ARB medication? Yes ?Does the patient have >5 day gap between last estimated fill dates? No ? ? ?Care Gaps: ?Colonoscopy-03/26/21 ?Diabetic Foot Exam-07/02/20 ?Ophthalmology-04/18/21 ?Dexa Scan - NA ?Annual Well Visit -  ?Micro albumin-05/06/21 ?Hemoglobin A1c- 07/05/21 ? ?Star Rating Drugs: ?Jardiance 10 mg-last fill 07/29/21 90 ds ?Metformin 1000 mg-last fill 08/26/21 90 ds ?Atorvastatin 40 mg-last fill 07/15/21 90 ds ?Telmisartan 80 mg-last fill 08/16/21 90 ds ? ?Ethelene Hal ?Clinical Pharmacist Assistant ?[phone]  ?

## 2021-09-29 NOTE — Patient Instructions (Signed)

## 2021-10-01 NOTE — Progress Notes (Signed)
Carelink Summary Report / Loop Recorder 

## 2021-10-04 ENCOUNTER — Emergency Department: Payer: PPO

## 2021-10-04 ENCOUNTER — Encounter: Payer: PPO | Admitting: Nurse Practitioner

## 2021-10-04 ENCOUNTER — Other Ambulatory Visit: Payer: Self-pay

## 2021-10-04 ENCOUNTER — Inpatient Hospital Stay (HOSPITAL_BASED_OUTPATIENT_CLINIC_OR_DEPARTMENT_OTHER)
Admit: 2021-10-04 | Discharge: 2021-10-04 | Disposition: A | Discharge status: Home / Self Care | Payer: PPO | Attending: Medical | Admitting: Medical

## 2021-10-04 ENCOUNTER — Inpatient Hospital Stay: Payer: PPO

## 2021-10-04 ENCOUNTER — Observation Stay
Admission: EM | Admit: 2021-10-04 | Discharge: 2021-10-05 | Disposition: A | Discharge status: Home / Self Care | Payer: PPO | Attending: Hospitalist | Admitting: Hospitalist

## 2021-10-04 DIAGNOSIS — J45909 Unspecified asthma, uncomplicated: Secondary | ICD-10-CM | POA: Insufficient documentation

## 2021-10-04 DIAGNOSIS — E119 Type 2 diabetes mellitus without complications: Secondary | ICD-10-CM | POA: Diagnosis not present

## 2021-10-04 DIAGNOSIS — H353211 Exudative age-related macular degeneration, right eye, with active choroidal neovascularization: Secondary | ICD-10-CM

## 2021-10-04 DIAGNOSIS — N179 Acute kidney failure, unspecified: Secondary | ICD-10-CM

## 2021-10-04 DIAGNOSIS — Z8673 Personal history of transient ischemic attack (TIA), and cerebral infarction without residual deficits: Secondary | ICD-10-CM | POA: Diagnosis not present

## 2021-10-04 DIAGNOSIS — W1839XA Other fall on same level, initial encounter: Secondary | ICD-10-CM | POA: Insufficient documentation

## 2021-10-04 DIAGNOSIS — I480 Paroxysmal atrial fibrillation: Secondary | ICD-10-CM | POA: Diagnosis not present

## 2021-10-04 DIAGNOSIS — D62 Acute posthemorrhagic anemia: Secondary | ICD-10-CM | POA: Diagnosis not present

## 2021-10-04 DIAGNOSIS — I2510 Atherosclerotic heart disease of native coronary artery without angina pectoris: Secondary | ICD-10-CM | POA: Insufficient documentation

## 2021-10-04 DIAGNOSIS — Z7901 Long term (current) use of anticoagulants: Secondary | ICD-10-CM | POA: Insufficient documentation

## 2021-10-04 DIAGNOSIS — Z8546 Personal history of malignant neoplasm of prostate: Secondary | ICD-10-CM | POA: Insufficient documentation

## 2021-10-04 DIAGNOSIS — I9589 Other hypotension: Secondary | ICD-10-CM

## 2021-10-04 DIAGNOSIS — I700 Atherosclerosis of aorta: Secondary | ICD-10-CM

## 2021-10-04 DIAGNOSIS — M439 Deforming dorsopathy, unspecified: Secondary | ICD-10-CM | POA: Diagnosis not present

## 2021-10-04 DIAGNOSIS — S7012XA Contusion of left thigh, initial encounter: Principal | ICD-10-CM | POA: Insufficient documentation

## 2021-10-04 DIAGNOSIS — R7989 Other specified abnormal findings of blood chemistry: Secondary | ICD-10-CM | POA: Diagnosis present

## 2021-10-04 DIAGNOSIS — I4819 Other persistent atrial fibrillation: Secondary | ICD-10-CM

## 2021-10-04 DIAGNOSIS — W19XXXA Unspecified fall, initial encounter: Secondary | ICD-10-CM | POA: Diagnosis not present

## 2021-10-04 DIAGNOSIS — Z96611 Presence of right artificial shoulder joint: Secondary | ICD-10-CM | POA: Diagnosis not present

## 2021-10-04 DIAGNOSIS — E1169 Type 2 diabetes mellitus with other specified complication: Secondary | ICD-10-CM

## 2021-10-04 DIAGNOSIS — Z959 Presence of cardiac and vascular implant and graft, unspecified: Secondary | ICD-10-CM | POA: Diagnosis not present

## 2021-10-04 DIAGNOSIS — Y92531 Health care provider office as the place of occurrence of the external cause: Secondary | ICD-10-CM | POA: Diagnosis not present

## 2021-10-04 DIAGNOSIS — I214 Non-ST elevation (NSTEMI) myocardial infarction: Secondary | ICD-10-CM | POA: Insufficient documentation

## 2021-10-04 DIAGNOSIS — I4891 Unspecified atrial fibrillation: Secondary | ICD-10-CM | POA: Diagnosis not present

## 2021-10-04 DIAGNOSIS — R569 Unspecified convulsions: Secondary | ICD-10-CM

## 2021-10-04 DIAGNOSIS — R296 Repeated falls: Secondary | ICD-10-CM | POA: Diagnosis not present

## 2021-10-04 DIAGNOSIS — Z96642 Presence of left artificial hip joint: Secondary | ICD-10-CM | POA: Diagnosis not present

## 2021-10-04 DIAGNOSIS — Z87891 Personal history of nicotine dependence: Secondary | ICD-10-CM | POA: Diagnosis not present

## 2021-10-04 DIAGNOSIS — I10 Essential (primary) hypertension: Secondary | ICD-10-CM | POA: Insufficient documentation

## 2021-10-04 DIAGNOSIS — J3489 Other specified disorders of nose and nasal sinuses: Secondary | ICD-10-CM | POA: Diagnosis not present

## 2021-10-04 DIAGNOSIS — I959 Hypotension, unspecified: Secondary | ICD-10-CM | POA: Diagnosis present

## 2021-10-04 DIAGNOSIS — Z79899 Other long term (current) drug therapy: Secondary | ICD-10-CM | POA: Diagnosis not present

## 2021-10-04 DIAGNOSIS — D696 Thrombocytopenia, unspecified: Secondary | ICD-10-CM

## 2021-10-04 DIAGNOSIS — G4733 Obstructive sleep apnea (adult) (pediatric): Secondary | ICD-10-CM

## 2021-10-04 DIAGNOSIS — Z96653 Presence of artificial knee joint, bilateral: Secondary | ICD-10-CM | POA: Diagnosis not present

## 2021-10-04 DIAGNOSIS — R778 Other specified abnormalities of plasma proteins: Secondary | ICD-10-CM | POA: Insufficient documentation

## 2021-10-04 DIAGNOSIS — E1159 Type 2 diabetes mellitus with other circulatory complications: Secondary | ICD-10-CM

## 2021-10-04 DIAGNOSIS — S0990XA Unspecified injury of head, initial encounter: Secondary | ICD-10-CM | POA: Diagnosis not present

## 2021-10-04 DIAGNOSIS — R52 Pain, unspecified: Secondary | ICD-10-CM | POA: Diagnosis not present

## 2021-10-04 DIAGNOSIS — Z20822 Contact with and (suspected) exposure to covid-19: Secondary | ICD-10-CM | POA: Diagnosis not present

## 2021-10-04 DIAGNOSIS — E1165 Type 2 diabetes mellitus with hyperglycemia: Secondary | ICD-10-CM

## 2021-10-04 DIAGNOSIS — D6869 Other thrombophilia: Secondary | ICD-10-CM

## 2021-10-04 DIAGNOSIS — R58 Hemorrhage, not elsewhere classified: Secondary | ICD-10-CM | POA: Diagnosis not present

## 2021-10-04 DIAGNOSIS — M25552 Pain in left hip: Secondary | ICD-10-CM | POA: Diagnosis not present

## 2021-10-04 DIAGNOSIS — Z7984 Long term (current) use of oral hypoglycemic drugs: Secondary | ICD-10-CM | POA: Diagnosis not present

## 2021-10-04 DIAGNOSIS — Z96612 Presence of left artificial shoulder joint: Secondary | ICD-10-CM | POA: Diagnosis not present

## 2021-10-04 DIAGNOSIS — M954 Acquired deformity of chest and rib: Secondary | ICD-10-CM | POA: Diagnosis not present

## 2021-10-04 DIAGNOSIS — R42 Dizziness and giddiness: Secondary | ICD-10-CM | POA: Diagnosis not present

## 2021-10-04 DIAGNOSIS — R531 Weakness: Secondary | ICD-10-CM | POA: Insufficient documentation

## 2021-10-04 DIAGNOSIS — Z981 Arthrodesis status: Secondary | ICD-10-CM | POA: Diagnosis not present

## 2021-10-04 LAB — ECHOCARDIOGRAM COMPLETE
AR max vel: 2.03 cm2
AV Area VTI: 2.19 cm2
AV Area mean vel: 2.34 cm2
AV Mean grad: 3 mmHg
AV Peak grad: 6.4 mmHg
Ao pk vel: 1.26 m/s
Area-P 1/2: 3.2 cm2
Height: 69 in
S' Lateral: 2.9 cm
Weight: 3382.4 oz

## 2021-10-04 LAB — URINALYSIS, COMPLETE (UACMP) WITH MICROSCOPIC
Bilirubin Urine: NEGATIVE
Glucose, UA: >=500 mg/dL — AB
Hgb urine dipstick: NEGATIVE
Ketones, ur: NEGATIVE mg/dL
Leukocytes,Ua: NEGATIVE
Nitrite: NEGATIVE
Protein, ur: NEGATIVE mg/dL
Specific Gravity, Urine: 1.013 (ref 1.005–1.030)
Squamous Epithelial / HPF: NONE SEEN (ref 0–5)
pH: 5 (ref 5.0–8.0)

## 2021-10-04 LAB — CBC
HCT: 30 % — ABNORMAL LOW (ref 39.0–52.0)
Hemoglobin: 9.3 g/dL — ABNORMAL LOW (ref 13.0–17.0)
MCH: 30.3 pg (ref 26.0–34.0)
MCHC: 31 g/dL (ref 30.0–36.0)
MCV: 97.7 fL (ref 80.0–100.0)
Platelets: 126 10*3/uL — ABNORMAL LOW (ref 150–400)
RBC: 3.07 MIL/uL — ABNORMAL LOW (ref 4.22–5.81)
RDW: 12.9 % (ref 11.5–15.5)
WBC: 7.7 10*3/uL (ref 4.0–10.5)
nRBC: 0 % (ref 0.0–0.2)

## 2021-10-04 LAB — LACTIC ACID, PLASMA
Lactic Acid, Venous: 3.4 mmol/L (ref 0.5–1.9)
Lactic Acid, Venous: 3 mmol/L (ref 0.5–1.9)

## 2021-10-04 LAB — TROPONIN I (HIGH SENSITIVITY)
Troponin I (High Sensitivity): 725 ng/L (ref <18)
Troponin I (High Sensitivity): 706 ng/L (ref <18)
Troponin I (High Sensitivity): 923 ng/L (ref <18)
Troponin I (High Sensitivity): 982 ng/L (ref <18)

## 2021-10-04 LAB — CK: Total CK: 113 U/L (ref 49–397)

## 2021-10-04 LAB — COMPREHENSIVE METABOLIC PANEL
ALT: 26 U/L (ref 0–44)
AST: 25 U/L (ref 15–41)
Albumin: 3.8 g/dL (ref 3.5–5.0)
Alkaline Phosphatase: 52 U/L (ref 38–126)
Anion gap: 11 (ref 5–15)
BUN: 49 mg/dL — ABNORMAL HIGH (ref 8–23)
CO2: 21 mmol/L — ABNORMAL LOW (ref 22–32)
Calcium: 8.2 mg/dL — ABNORMAL LOW (ref 8.9–10.3)
Chloride: 104 mmol/L (ref 98–111)
Creatinine, Ser: 2.89 mg/dL — ABNORMAL HIGH (ref 0.61–1.24)
GFR, Estimated: 23 mL/min — ABNORMAL LOW (ref >60)
Glucose, Bld: 188 mg/dL — ABNORMAL HIGH (ref 70–99)
Potassium: 4.2 mmol/L (ref 3.5–5.1)
Sodium: 136 mmol/L (ref 135–145)
Total Bilirubin: 1.3 mg/dL — ABNORMAL HIGH (ref 0.3–1.2)
Total Protein: 6 g/dL — ABNORMAL LOW (ref 6.5–8.1)

## 2021-10-04 LAB — CBG MONITORING, ED: Glucose-Capillary: 104 mg/dL — ABNORMAL HIGH (ref 70–99)

## 2021-10-04 LAB — HEPARIN LEVEL (UNFRACTIONATED): Heparin Unfractionated: >1.1 IU/mL — ABNORMAL HIGH (ref 0.30–0.70)

## 2021-10-04 LAB — APTT: aPTT: 32 seconds (ref 24–36)

## 2021-10-04 LAB — RESP PANEL BY RT-PCR (FLU A&B, COVID) ARPGX2
Influenza A by PCR: NEGATIVE
Influenza B by PCR: NEGATIVE
SARS Coronavirus 2 by RT PCR: NEGATIVE

## 2021-10-04 LAB — GLUCOSE, CAPILLARY: Glucose-Capillary: 200 mg/dL — ABNORMAL HIGH (ref 70–99)

## 2021-10-04 MED ORDER — SODIUM CHLORIDE 0.9 % IV BOLUS
1000.0000 mL | Freq: Once | INTRAVENOUS | Status: AC
  Administered 2021-10-04: 1000 mL via INTRAVENOUS

## 2021-10-04 MED ORDER — LORATADINE 10 MG PO TABS
10.0000 mg | ORAL_TABLET | Freq: Every day | ORAL | Status: DC
  Administered 2021-10-05: 10 mg via ORAL
  Filled 2021-10-04: qty 1

## 2021-10-04 MED ORDER — INSULIN ASPART 100 UNIT/ML IJ SOLN
0.0000 [IU] | Freq: Three times a day (TID) | INTRAMUSCULAR | Status: DC
  Administered 2021-10-05: 2 [IU] via SUBCUTANEOUS
  Filled 2021-10-04 (×2): qty 1

## 2021-10-04 MED ORDER — VITAMIN B-12 1000 MCG PO TABS
5000.0000 ug | ORAL_TABLET | ORAL | Status: DC
  Administered 2021-10-05: 5000 ug via ORAL
  Filled 2021-10-04: qty 5

## 2021-10-04 MED ORDER — TRAMADOL HCL 50 MG PO TABS: 50.0000 mg | ORAL_TABLET | Freq: Three times a day (TID) | ORAL | Status: DC | PRN

## 2021-10-04 MED ORDER — LEVETIRACETAM 500 MG PO TABS
500.0000 mg | ORAL_TABLET | Freq: Two times a day (BID) | ORAL | Status: DC
  Administered 2021-10-04 – 2021-10-05 (×2): 500 mg via ORAL
  Filled 2021-10-04 (×2): qty 1

## 2021-10-04 MED ORDER — SODIUM CHLORIDE 0.9% FLUSH: 3.0000 mL | INTRAVENOUS | Status: DC | PRN

## 2021-10-04 MED ORDER — HEPARIN (PORCINE) 25000 UT/250ML-% IV SOLN
1100.0000 [IU]/h | INTRAVENOUS | Status: DC
  Administered 2021-10-04: 1100 [IU]/h via INTRAVENOUS
  Filled 2021-10-04: qty 250

## 2021-10-04 MED ORDER — B-12 5000 MCG PO CAPS: 5000.0000 ug | ORAL_CAPSULE | ORAL | Status: DC

## 2021-10-04 MED ORDER — ATORVASTATIN CALCIUM 20 MG PO TABS
40.0000 mg | ORAL_TABLET | Freq: Every day | ORAL | Status: DC
  Administered 2021-10-04: 40 mg via ORAL
  Filled 2021-10-04: qty 2

## 2021-10-04 MED ORDER — SODIUM CHLORIDE 0.9 % IV SOLN: 250.0000 mL | INTRAVENOUS | Status: DC | PRN

## 2021-10-04 MED ORDER — AMITRIPTYLINE HCL 25 MG PO TABS
25.0000 mg | ORAL_TABLET | Freq: Every day | ORAL | Status: DC
  Administered 2021-10-04: 25 mg via ORAL
  Filled 2021-10-04: qty 1

## 2021-10-04 MED ORDER — MULTIVITAMIN PO LIQD: Freq: Every day | ORAL | Status: DC

## 2021-10-04 MED ORDER — ONDANSETRON HCL 4 MG PO TABS: 4.0000 mg | ORAL_TABLET | Freq: Four times a day (QID) | ORAL | Status: DC | PRN

## 2021-10-04 MED ORDER — SODIUM CHLORIDE 0.9% FLUSH
3.0000 mL | Freq: Two times a day (BID) | INTRAVENOUS | Status: DC
  Administered 2021-10-04 – 2021-10-05 (×3): 3 mL via INTRAVENOUS

## 2021-10-04 MED ORDER — SODIUM CHLORIDE 0.9 % IV BOLUS
500.0000 mL | Freq: Once | INTRAVENOUS | Status: AC
  Administered 2021-10-04: 500 mL via INTRAVENOUS

## 2021-10-04 MED ORDER — VITAMIN D 25 MCG (1000 UNIT) PO TABS
2000.0000 [IU] | ORAL_TABLET | Freq: Every day | ORAL | Status: DC
  Administered 2021-10-05: 2000 [IU] via ORAL
  Filled 2021-10-04: qty 2

## 2021-10-04 MED ORDER — TAMSULOSIN HCL 0.4 MG PO CAPS
0.4000 mg | ORAL_CAPSULE | Freq: Every day | ORAL | Status: DC
  Administered 2021-10-04: 0.4 mg via ORAL
  Filled 2021-10-04: qty 1

## 2021-10-04 MED ORDER — ADULT MULTIVITAMIN W/MINERALS CH
1.0000 | ORAL_TABLET | Freq: Every day | ORAL | Status: DC
  Administered 2021-10-05: 1 via ORAL
  Filled 2021-10-04: qty 1

## 2021-10-04 MED ORDER — ONDANSETRON HCL 4 MG/2ML IJ SOLN: 4.0000 mg | Freq: Four times a day (QID) | INTRAMUSCULAR | Status: DC | PRN

## 2021-10-04 MED ORDER — ALBUTEROL SULFATE (2.5 MG/3ML) 0.083% IN NEBU: 3.0000 mL | INHALATION_SOLUTION | Freq: Four times a day (QID) | RESPIRATORY_TRACT | Status: DC | PRN

## 2021-10-04 MED ORDER — FLUTICASONE PROPIONATE 50 MCG/ACT NA SUSP: 2.0000 | Freq: Every day | NASAL | Status: DC | PRN

## 2021-10-04 NOTE — Assessment & Plan Note (Signed)
Maintain consistent carbohydrate diet Glycemic control with sliding scale insulin 

## 2021-10-04 NOTE — Assessment & Plan Note (Signed)
Patient is currently in sinus rhythm ?Hold Eliquis due to symptomatic anemia ?

## 2021-10-04 NOTE — ED Triage Notes (Signed)
C/o multiple falls x3 days. Pt. Denies any head trauma. Pt. Saw PCP today, fell at office, and EMS was called. Pt. States he fell from standing to floor today. Pt. Has large bruise to left thigh from fall on wed. Pt. Take elequs. Hx of stroke x4. BGL 201 en route, 75 fent. Enroute per EMS. ?

## 2021-10-04 NOTE — Assessment & Plan Note (Signed)
Patient presents for evaluation of generalized weakness which appears to be secondary to acute blood loss anemia following a fall. ?Patient has had a 4 g drop in his H&H 13.3 >> 9.3  ?Patient is on Eliquis as secondary prophylaxis for paroxysmal A-fib and is status post fall with extensive ecchymosis involving the left lower extremity ?We will monitor H&H closely and will transfuse as needed ? ? ?

## 2021-10-04 NOTE — ED Notes (Signed)
Pt in CT, 2 Rns and cont. Cardiac monitoring in place. ?

## 2021-10-04 NOTE — ED Notes (Signed)
Dr. Kerman Passey notified pt is back from CT, and BP is 74/40. 500 NS bolus ordered and administered. ?

## 2021-10-04 NOTE — Assessment & Plan Note (Addendum)
Unclear etiology and probably from demand ischemia related to hypotension. ?Patient has a known history of coronary artery disease and is status post stent angioplasty ?Patient denies having any chest pain or shortness of breath. ?We will cycle cardiac enzymes ?Obtain 2D echocardiogram to assess LVEF and rule out regional wall motion abnormality ?We will hold off on administering IV heparin for now due to symptomatic anemia and would defer to cardiology. ?Consult cardiology ?

## 2021-10-04 NOTE — ED Notes (Signed)
Pt. BP 52/2 ?

## 2021-10-04 NOTE — Assessment & Plan Note (Signed)
Patient has a baseline serum creatinine of 1.27 and today on admission it is 2.89 ?AKI appears to be secondary to ATN from hypotension related to acute blood loss anemia and concomitant medication use ?Hold telmisartan ?Avoid nephrotoxic agents ?IV fluid resuscitation ?

## 2021-10-04 NOTE — Assessment & Plan Note (Signed)
Continue Keppra ?Seizure precautions ?

## 2021-10-04 NOTE — ED Notes (Signed)
Dr Agbata at bedside. 

## 2021-10-04 NOTE — ED Provider Notes (Signed)
? ?[facility] ?Provider Note ? ? ? Event Date/Time  ? First MD Initiated Contact with Patient 10/04/21 [id number]   ?  (approximate) ? ?History  ? ?Chief Complaint: Fall (C/o multiple falls x3 days. Pt. Denies any head trauma. Pt. Saw PCP today, fell at office, and EMS was called. Pt. States he fell from standing to floor today. Pt. Has large bruise to left thigh from fall on wed. Pt. Take elequs. Hx of stroke x4. BGL 201 en route, 75 fent. Enroute per EMS.) ? ?HPI ? ?Andrew Cobb is a 71 y.o. male with a past medical history of hypertension, hyperlipidemia, 4 prior CVAs who presents to the emergency department for weakness with increased falls.  According to the patient report patient states over the past 3 days he has had 3 falls and has been feeling very weak.  Patient states a history of 4 strokes previously but continues to work.  Patient does have slightly slurred speech is not clear if this is old or new deficit and the patient is not able to tell me.  Awaiting family arrival for further history.  Patient has generalized decreased strength in all extremities but no focal deficit identified.  Patient denies any fever cough congestion vomiting diarrhea or dysuria.  Patient does have a large bruise to the left hip from a prior fall. ? ?Physical Exam  ? ?Triage Vital Signs: ?ED Triage Vitals  ?Enc Vitals Group  ?   BP 10/04/21 0933 (!) 133/107  ?   Pulse Rate 10/04/21 0933 76  ?   Resp 10/04/21 0933 20  ?   Temp 10/04/21 0933 97.8 ?F (36.6 ?C)  ?   Temp Source 10/04/21 0933 Oral  ?   SpO2 10/04/21 0933 96 %  ?   Weight 10/04/21 0938 211 lb 6.4 oz (95.9 kg)  ?   Height --   ?   Head Circumference --   ?   Peak Flow --   ?   Pain Score 10/04/21 0937 10  ?   Pain Loc --   ?   Pain Edu? --   ?   Excl. in [place]? --   ? ? ?Most recent vital signs: ?Vitals:  ? 10/04/21 0933  ?BP: (!) 133/107  ?Pulse: 76  ?Resp: 20  ?Temp: 97.8 ?F (36.6 ?C)  ?SpO2: 96%  ? ? ?General: Awake, no distress.  Mildly slurred  speech. ?CV:  Good peripheral perfusion.  Regular rate and rhythm  ?Resp:  Normal effort.  Equal breath sounds bilaterally.  ?Abd:  No distention.  Soft, nontender.  No rebound or guarding. ?Other:  Large hematoma to left thigh from a prior fall.  Mildly slurred speech.  Good grip strength bilaterally however 4/5 strength noted in all extremities with a slight right upper extremity pronator drift.  Again with a history of 4 prior strokes is not entirely clear if any of these deficits are new. ? ? ?ED Results / Procedures / Treatments  ? ?EKG ? ?EKG viewed and interpreted by myself shows a normal sinus rhythm at 80 bpm with a narrow QRS, left axis deviation, largely normal intervals with no concerning ST changes. ? ?RADIOLOGY ? ?I have personally reviewed the chest x-ray images, no acute abnormality seen on my evaluation ?Chest x-ray read as negative by radiology ?CT head shows remote infarct without acute abnormality ? ? ?MEDICATIONS ORDERED IN ED: ?Medications  ?sodium chloride 0.9 % bolus 1,000 mL (has no administration in time range)  ? ? ? ?  IMPRESSION / MDM / ASSESSMENT AND PLAN / ED COURSE  ?I reviewed the triage vital signs and the nursing notes. ? ?Patient presents to the emergency department for multiple falls with generalized weakness.  Patient found to be quite hypotensive in the emergency department.  We will bolus a liter of fluid.  We will check labs including a lactic acid as well as a troponin.  EKG is largely nonrevealing.  Differential is quite broad but would include CVA, metabolic or electrolyte abnormalities such as renal insufficiency or dehydration leading to weakness and falls, infectious etiology such as COVID or UTI.  We will IV hydrate we will check labs we will obtain CT imaging once blood pressure has stabilized.  We will obtain a chest x-ray while awaiting results.  Patient agreeable to plan of care.  Given the patient's weakness with increased falls and initial hypotension I anticipate  likely admission to the hospital once patient's emergency department work-up is been completed. ? ?Patient's lab work has resulted showing acute renal insufficiency with creatinine of 2.89 versus a baseline of 1.2.  Patient's troponin is significantly elevated 725.  No concerning findings on the EKG, elevated troponin could be due to demand ischemia versus NSTEMI versus PE.  Denies any chest pain or shortness of breath.  Creatinine cannot tolerate a CTA at this time.  Patient is on Eliquis we will order heparin per pharmacy consult to be started when appropriate given the patient's current anticoagulation status.  Patient's hemoglobin has dropped several points, he does have fairly large hematoma to the left lateral thigh which could account for this drop.  Lactate is elevated at 3.4 but no infectious symptoms, normal white blood cell count.  No fever or infectious signs.  Suspect this is more related to the patient's hypotension which is improving with IV fluids.  Patient has now received 2.5 L of IV fluids map is around 70.  Patient will require admission to the hospital service likely stepdown level of care given his current status. ? ?CRITICAL CARE ?Performed by: Harvest Dark ? ? ?Total critical care time: 30 minutes ? ?Critical care time was exclusive of separately billable procedures and treating other patients. ? ?Critical care was necessary to treat or prevent imminent or life-threatening deterioration. ? ?Critical care was time spent personally by me on the following activities: development of treatment plan with patient and/or surrogate as well as nursing, discussions with consultants, evaluation of patient's response to treatment, examination of patient, obtaining history from patient or surrogate, ordering and performing treatments and interventions, ordering and review of laboratory studies, ordering and review of radiographic studies, pulse oximetry and re-evaluation of patient's  condition. ? ? ?FINAL CLINICAL IMPRESSION(S) / ED DIAGNOSES  ? ?Weakness ?Hypotension ?NSTEMI ? ?Note:  This document was prepared using Dragon voice recognition software and may include unintentional dictation errors. ?  ?Harvest Dark, MD ?10/04/21 1159 ? ?

## 2021-10-04 NOTE — Assessment & Plan Note (Signed)
-  Continue statins ?

## 2021-10-04 NOTE — Assessment & Plan Note (Signed)
Unclear etiology but most likely secondary to acute blood loss anemia and concomitant antihypertensive medication use ?Patient's blood pressure was in the 72W systolic when he arrived to ER and he responded to IV fluid resuscitation. ?Hold telmisartan ?

## 2021-10-04 NOTE — Consult Note (Signed)
?Cardiology Consultation:  ? ?Patient ID: Andrew Cobb ?MRN: [id number]; DOB: Jun 02, 1951 ? ?Admit date: 10/04/2021 ?Date of Consult: 10/04/2021 ? ?PCP:  [name], NP ?  ?[facility] Providers ?Cardiologist:  None  ?Electrophysiologist:  Cristopher Peru, MD  { ? ? ?Patient Profile:  ? ?Andrew Cobb is a 71 y.o. male with a hx of CAD with remote stenting, DM, OSA on CPAP, HTN, prior CVA, x 4, atrial fibrillation on Eliquis who is being seen 10/04/2021 for the evaluation of elevated troponin at the request of Dr. Francine Graven. ? ?History of Present Illness:  ? ?Mr. Pruden reports h/o CAD with stenting x 2 in 1996 at [facility]. He did not have a heart attack at the time. He has not seen generally cardiology in 5 years.  ? ?The patient is mainly followed for atrial fib by  the [facility] and Dr. Lovena Le. He has h/o cryptogenic CVA in 09/2018 and subsequently underwent ILR implantation which diagnosed the patient with afib on 05/07/21. CHADSVASC of 6. The patient underwent cardioversion 07/04/21 and was seen in follow-up 07/23/21. He is in NSR. He is anticoagulated with Eliquis.  ? ?Most recent echo was in 2020 showing normal LVSF, mild LH, impaired relaxation. No recent ischemic testing. Chest CT from 2021 showed 3V CAD. ? ?The patient presented to the ER 09/16/21 with weakness and a fall. The fall occurred two days ago outside near his car. He doesn't remember many prodromal symptoms, but may have felt dizzy. When he fell the left side of his body hit the car. He got in the car and drove home. HE developed significant bruising on the left thigh. The patient denies chest pain, SOB, orthopnea, pnd, LLE, palpitations. Says he has some orthostatic dizziness. Also reports generalized weakness. He went into get evaluated by PCP. In the office he was noted to be bradycardic and hypotensive and EMS was called.  ? ?In the ER BP was low and he was given IVF with improvement to 133/107. HR 76bpm, RR 20, afebrile. CT head with no  acute abnormality. CXR with no acute process. Labs showed Hgb 9.3, WBC 7.7, Scr 2.89, BUN 49. CO2 21, normal LFTs, HS trop 725. LA 3.4. EKG showed NSR. The patient was admitted for further work-up. ? ?Past Medical History:  ?Diagnosis Date  ? Arthritis   ? Asthma   ? has not needed since 6/21  ? Cancer [facility])   ? prostate  ? Chronic venous insufficiency   ? varicose vein lower extremity with inflammation  ? Coronary artery disease 1996  ? two stents placed   ? Diabetes mellitus without complication ([place])   ? type 2 on metformin  ? GERD (gastroesophageal reflux disease)   ? no issues since gastric bypass surgery as stated per pt  ? Hyperlipidemia   ? Hypertension   ? Hypogonadism in male   ? MRSA (methicillin resistant Staphylococcus aureus) infection   ? 07/30/2008 thru 08/07/2008  abdominal abcess  ? Sleep apnea   ? on BIPAP  ? Stented coronary artery   ? Stroke [facility]) 09/07/2019  ? no defecits  ? Thrombocythemia   ? ? ?Past Surgical History:  ?Procedure Laterality Date  ? ANGIOPLASTY    ? ANGIOPLASTY    ? with stent 04/07/1995  ? ANTERIOR CERVICAL DECOMPRESSION/DISCECTOMY FUSION 4 LEVELS N/A 08/17/2017  ? Procedure: Anterior discectomy with fusion and plate fixation Cervical Three-Four, Four-Five, Five-Six, and Six-Seven Fusion;  Surgeon: Ditty, Kevan Ny, MD;  Location: [place];  Service: Neurosurgery;  Laterality: N/A;  Anterior discectomy with fusion and plate fixation ?Cervical Three-Four, Four-Five, Five-Six, and Six-Seven Fusion   ? APPENDECTOMY    ? 1966  ? BUBBLE STUDY  09/20/2018  ? Procedure: BUBBLE STUDY;  Surgeon: Josue Hector, MD;  Location: [place];  Service: Cardiovascular;;  ? [place]  ? CARDIOVASCULAR STRESS TEST    ? 07/31/2011  ? CARDIOVERSION N/A 07/04/2021  ? Procedure: CARDIOVERSION;  Surgeon: [name], MD;  Location: [facility] ENDOSCOPY;  Service: Cardiovascular;  Laterality: N/A;  ? CARPAL TUNNEL RELEASE Left   ? CHOLECYSTECTOMY    ? 2006  ? COLONOSCOPY WITH  PROPOFOL N/A 03/26/2021  ? Procedure: COLONOSCOPY WITH PROPOFOL;  Surgeon: Jonathon Bellows, MD;  Location: [facility] ENDOSCOPY;  Service: Gastroenterology;  Laterality: N/A;  ? colonscopy     ? 08/25/2012  ? EYE SURGERY Bilateral   ? cataract  ? FUNCTIONAL ENDOSCOPIC SINUS SURGERY    ? 11/10/2013  ? GASTRIC BYPASS    ? 10/05/2012  ? HERNIA REPAIR    ? left inguinal 1981  ? INCISION AND DRAINAGE ABSCESS Right 02/26/2017  ? Procedure: INCISION AND DRAINAGE ABSCESS;  Surgeon: Nickie Retort, MD;  Location: [facility] ORS;  Service: Urology;  Laterality: Right;  ? JOINT REPLACEMENT    ? bilateral  ? left ankle surgery     ? 05/03/2003   ? left carpel tunnel     ? 09/18/1993  ? left knee meniscal tear     ? 01/25/2010  ? left knee meniscal tear repair     ? 05/04/1996  ? left rotator cuff repair     ? 05/03/2003   ? LOOP RECORDER INSERTION N/A 09/20/2018  ? Procedure: LOOP RECORDER INSERTION;  Surgeon: Evans Lance, MD;  Location: [place] CV LAB;  Service: Cardiovascular;  Laterality: N/A;  ? REPLACEMENT TOTAL KNEE BILATERAL  07/13/2015  ? REVERSE SHOULDER ARTHROPLASTY Left 08/30/2020  ? Procedure: REVERSE SHOULDER ARTHROPLASTY;  Surgeon: Justice Britain, MD;  Location: [facility];  Service: Orthopedics;  Laterality: Left;  178mn  ? right ankle surgery     ? fracture has 2 screws 07/07/1997  ? right carpel tunnel     ? 05/16/1992  ? right shoulder replacement     ? 01/27/2006  ? SCROTAL EXPLORATION Right 02/26/2017  ? Procedure: SCROTUM EXPLORATION;  Surgeon: BNickie Retort MD;  Location: [facility];  Service: Urology;  Laterality: Right;  ? TEE WITHOUT CARDIOVERSION N/A 09/20/2018  ? Procedure: TRANSESOPHAGEAL ECHOCARDIOGRAM (TEE);  Surgeon: NJosue Hector MD;  [facility];  Service: Cardiovascular;  Laterality: N/A;  ? TOTAL KNEE ARTHROPLASTY Left 07/13/2015  ? Procedure: LEFT TOTAL KNEE ARTHROPLASTY;  Surgeon: FGaynelle Arabian MD;  [facility];  Service: Orthopedics;  Laterality: Left;  ?  ? ?Home Medications:  ?Prior to  Admission medications   ?Medication Sig Start Date End Date Taking? Authorizing Provider  ?acyclovir (ZOVIRAX) 400 MG tablet Take 1 tablet (400 mg total) by mouth 2 (two) times daily. 07/05/20   [name], JHenrine ScrewsT, NP  ?albuterol (VENTOLIN HFA) 108 (90 Base) MCG/ACT inhaler Inhale 2 puffs into the lungs every 6 (six) hours as needed for wheezing or shortness of breath. 07/11/21   CMarnee GuarneriT, NP  ?amitriptyline (ELAVIL) 25 MG tablet Take 1 tablet (25 mg total) by mouth at bedtime. 02/21/21   CMarnee GuarneriT, NP  ?apixaban (ELIQUIS) 5 MG TABS tablet Take 1 tablet (5 mg total) by mouth 2 (  two) times daily. 05/16/21   Fenton, Clint R, PA  ?atorvastatin (LIPITOR) 40 MG tablet Take 1 tablet (40 mg total) by mouth at bedtime. 02/21/21   Marnee Guarneri T, NP  ?betamethasone dipropionate (DIPROLENE) 0.05 % ointment Apply 1 application topically 2 (two) times daily as needed for rash. 08/09/20   [provider]  ?cetirizine (ZYRTEC) 10 MG tablet Take 10 mg by mouth daily.    [provider]  ?Cholecalciferol (VITAMIN D) 2000 units CAPS Take 2,000 Units by mouth daily.    [provider]  ?clobetasol ointment (TEMOVATE) 8.25 % Apply 1 application topically 2 (two) times daily as needed (rash). 02/25/21   [provider]  ?Cyanocobalamin (B-12) 5000 MCG CAPS Take 5,000 mcg by mouth every other day.    [provider]  ?empagliflozin (JARDIANCE) 10 MG TABS tablet Take 1 tablet (10 mg total) by mouth daily before breakfast. 07/29/21   [name], Henrine Screws T, NP  ?fluticasone (FLONASE) 50 MCG/ACT nasal spray Place 2 sprays into both nostrils daily. 07/16/21   [name], Henrine Screws T, NP  ?glucose blood (ONETOUCH ULTRA) test strip 1 each by Other route 2 (two) times daily. Use as instructed 03/06/21   [name], NP  ?Lancets (ONETOUCH ULTRASOFT) lancets USE 1  TO CHECK GLUCOSE TWICE DAILY 11/24/20   [name], Henrine Screws T, NP  ?levETIRAcetam (KEPPRA) 500 MG tablet Take 1 tablet (500 mg total) by  mouth 2 (two) times daily. 07/05/21   Marnee Guarneri T, NP  ?metFORMIN (GLUCOPHAGE) 1000 MG tablet Take 1 tablet (1,000 mg total) by mouth 2 (two) times daily with a meal. 05/06/21   [name], NP  ?[place]

## 2021-10-04 NOTE — H&P (Signed)
?History and Physical  ? ? ?Patient: Andrew Cobb URK:[id number] DOB: [birth year] ?DOA: 10/04/2021 ?DOS: the patient was seen and examined on 10/04/2021 ?PCP: [name], NP  ?Patient coming from: Home ? ?Chief Complaint:  ?Chief Complaint  ?Patient presents with  ? Fall  ?  C/o multiple falls x3 days. Pt. Denies any head trauma. Pt. Saw PCP today, fell at office, and EMS was called. Pt. States he fell from standing to floor today. Pt. Has large bruise to left thigh from fall on wed. Pt. Take elequs. Hx of stroke x4. BGL 201 en route, 75 fent. Enroute per EMS.  ? ?HPI: Andrew Cobb is a 71 y.o. male with medical history significant for paroxysmal A-fib on chronic anticoagulation therapy with Eliquis, coronary artery disease status post stent angioplasty, diabetes mellitus, GERD, history of CVA, history of sleep apnea who presents to the ER by EMS from his primary care provider's office. ?Patient states that he fell 2 days prior to presenting to the hospital.  He was stepping out of his car and missed a step landing on his left side.  He was able to get up and has been able to bear weight on his left leg but complains of severe pain in his left hip and lateral chest wall.  He has extensive bruising to his left lower extremity following the fall. ?Since Wednesday he has had multiple falls at home and states that he gets dizzy and lightheaded prior to the falls and is very weak.  His wife took him to see his primary care provider and patient was unable to ambulate from the doorway into the room and so was gently lowered to the floor by the [place].  He was noted to be bradycardic at his doctor's office with a documented heart rate of 50 and they were unable to get a blood pressure reading.  EMS was called and patient was transported to the ER. ?Upon arrival to the ER he was hypotensive with blood pressure 78/57. ?He received a 2 L fluid bolus with improvement in his blood pressure. ?Labs reveal a 4 g drop in his  H&H.  3 months ago patient had a hemoglobin of 13.3 and today on admission it is 9.3. ?He will be admitted to the hospital for further evaluation. ? ?Review of Systems: As mentioned in the history of present illness. All other systems reviewed and are negative. ?Past Medical History:  ?Diagnosis Date  ? Arthritis   ? Asthma   ? has not needed since 6/21  ? Cancer [facility])   ? prostate  ? Chronic venous insufficiency   ? varicose vein lower extremity with inflammation  ? Coronary artery disease 1996  ? two stents placed   ? Diabetes mellitus without complication ([place])   ? type 2 on metformin  ? GERD (gastroesophageal reflux disease)   ? no issues since gastric bypass surgery as stated per pt  ? Hyperlipidemia   ? Hypertension   ? Hypogonadism in male   ? MRSA (methicillin resistant Staphylococcus aureus) infection   ? 07/30/2008 thru 08/07/2008  abdominal abcess  ? Sleep apnea   ? on BIPAP  ? Stented coronary artery   ? Stroke [facility]) 09/07/2019  ? no defecits  ? Thrombocythemia   ? ?Past Surgical History:  ?Procedure Laterality Date  ? ANGIOPLASTY    ? ANGIOPLASTY    ? with stent 04/07/1995  ? ANTERIOR CERVICAL DECOMPRESSION/DISCECTOMY FUSION 4 LEVELS N/A 08/17/2017  ? Procedure: Anterior discectomy  with fusion and plate fixation Cervical Three-Four, Four-Five, Five-Six, and Six-Seven Fusion;  Surgeon: Ditty, Kevan Ny, MD;  Location: [place];  Service: Neurosurgery;  Laterality: N/A;  Anterior discectomy with fusion and plate fixation ?Cervical Three-Four, Four-Five, Five-Six, and Six-Seven Fusion   ? APPENDECTOMY    ? 1966  ? BUBBLE STUDY  09/20/2018  ? Procedure: BUBBLE STUDY;  Surgeon: Josue Hector, MD;  Location: [place];  Service: Cardiovascular;;  ? [place]  ? CARDIOVASCULAR STRESS TEST    ? 07/31/2011  ? CARDIOVERSION N/A 07/04/2021  ? Procedure: CARDIOVERSION;  Surgeon: [name], MD;  Location[facility] END[facility]COPY;  Service: Cardiovascular;  Laterality: N/A;  ? CARPAL TUNNEL RELEASE  Left   ? CHOLECYSTECTOMY    ? 2006  ? COLONOSCOPY WITH PROPOFOL N/A 03/26/2021  ? Procedure: COLONOSCOPY WITH PROPOFOL;  Surgeon: Jonathon Bellows, MD;  [facility] ENDOSCOPY;  Service: Gastroenterology;  Laterality: N/A;  ? colonscopy     ? 08/25/2012  ? EYE SURGERY Bilateral   ? cataract  ? FUNCTIONAL ENDOSCOPIC SINUS SURGERY    ? 11/10/2013  ? GASTRIC BYPASS    ? 10/05/2012  ? HERNIA REPAIR    ? left inguinal 1981  ? INCISION AND DRAINAGE ABSCESS Right 02/26/2017  ? Procedure: INCISION AND DRAINAGE ABSCESS;  Surgeon: Nickie Retort, MD;  Location: [facility];  Service: Urology;  Laterality: Right;  ? JOINT REPLACEMENT    ? bilateral  ? left ankle surgery     ? 05/03/2003   ? left carpel tunnel     ? 09/18/1993  ? left knee meniscal tear     ? 01/25/2010  ? left knee meniscal tear repair     ? 05/04/1996  ? left rotator cuff repair     ? 05/03/2003   ? LOOP RECORDER INSERTION N/A 09/20/2018  ? Procedure: LOOP RECORDER INSERTION;  Surgeon: Evans Lance, MD;  Location: [place] CV LAB;  Service: Cardiovascular;  Laterality: N/A;  ? REPLACEMENT TOTAL KNEE BILATERAL  07/13/2015  ? REVERSE SHOULDER ARTHROPLASTY Left 08/30/2020  ? Procedure: REVERSE SHOULDER ARTHROPLASTY;  Surgeon: Justice Britain, MD;  [facility];  Service: Orthopedics;  Laterality: Left;  160mn  ? right ankle surgery     ? fracture has 2 screws 07/07/1997  ? right carpel tunnel     ? 05/16/1992  ? right shoulder replacement     ? 01/27/2006  ? SCROTAL EXPLORATION Right 02/26/2017  ? Procedure: SCROTUM EXPLORATION;  Surgeon: BNickie Retort MD;  Location: [facility];  Service: Urology;  Laterality: Right;  ? TEE WITHOUT CARDIOVERSION N/A 09/20/2018  ? Procedure: TRANSESOPHAGEAL ECHOCARDIOGRAM (TEE);  Surgeon: NJosue Hector MD;  [facility];  Service: Cardiovascular;  Laterality: N/A;  ? TOTAL KNEE ARTHROPLASTY Left 07/13/2015  ? Procedure: LEFT TOTAL KNEE ARTHROPLASTY;  Surgeon: FGaynelle Arabian MD;  [facility];  Service: Orthopedics;   Laterality: Left;  ? ?Social History:  reports that he quit smoking about 37 years ago. His smoking use included cigarettes. He has a 10.00 pack-year smoking history. He has never used smokeless tobacco. He reports that he does not drink alcohol and does not use drugs. ? ?Allergies  ?Allergen Reactions  ? Succinylsulphathiazole Rash  ? Sulfamethoxazole-Trimethoprim Rash  ? Tetracyclines & Related Rash  ? ? ?Family History  ?Problem Relation Age of Onset  ? Cancer Mother   ?     pancreatic  ? Diabetes Mother   ? Stroke Mother   ?  Heart disease Mother   ? Hyperlipidemia Mother   ? Hypertension Mother   ? Heart attack Mother   ? Heart disease Father   ? Stroke Father   ? Diabetes Father   ? Hypertension Father   ? Heart attack Father   ? Hyperlipidemia Father   ? Pancreatic cancer Father   ? Cancer Sister   ? Cancer Brother   ?     lung  ? Cancer Brother   ? Kidney cancer Neg Hx   ? Bladder Cancer Neg Hx   ? Prostate cancer Neg Hx   ? ? ?Prior to Admission medications   ?Medication Sig Start Date End Date Taking? Authorizing Provider  ?acyclovir (ZOVIRAX) 400 MG tablet Take 1 tablet (400 mg total) by mouth 2 (two) times daily. 07/05/20   [name], Henrine Screws T, NP  ?albuterol (VENTOLIN HFA) 108 (90 Base) MCG/ACT inhaler Inhale 2 puffs into the lungs every 6 (six) hours as needed for wheezing or shortness of breath. 07/11/21   Marnee Guarneri T, NP  ?amitriptyline (ELAVIL) 25 MG tablet Take 1 tablet (25 mg total) by mouth at bedtime. 02/21/21   Marnee Guarneri T, NP  ?apixaban (ELIQUIS) 5 MG TABS tablet Take 1 tablet (5 mg total) by mouth 2 (two) times daily. 05/16/21   Fenton, Clint R, PA  ?atorvastatin (LIPITOR) 40 MG tablet Take 1 tablet (40 mg total) by mouth at bedtime. 02/21/21   Marnee Guarneri T, NP  ?betamethasone dipropionate (DIPROLENE) 0.05 % ointment Apply 1 application topically 2 (two) times daily as needed for rash. 08/09/20   [provider]  ?cetirizine (ZYRTEC) 10 MG tablet Take 10 mg by mouth daily.     [provider]  ?Cholecalciferol (VITAMIN D) 2000 units CAPS Take 2,000 Units by mouth daily.    [provider]  ?clobetasol ointment (TEMOVATE) 5.73 % Apply 1 application topical

## 2021-10-04 NOTE — Progress Notes (Signed)
*  PRELIMINARY RESULTS* ?Echocardiogram ?2D Echocardiogram has been performed. ? ?Andrew Cobb, [name] ?10/04/2021, 1:47 PM ?

## 2021-10-04 NOTE — ED Notes (Signed)
Per pt, last dose of Eliquis was this AM. ?

## 2021-10-04 NOTE — ED Notes (Signed)
Pt asking for water. Per Dr. Kerman Passey this is okay. Water given to pt.  ?

## 2021-10-04 NOTE — ED Notes (Signed)
Dinner tray given

## 2021-10-04 NOTE — ED Notes (Signed)
Light green tube & urine sample sent to lab. ?

## 2021-10-04 NOTE — Assessment & Plan Note (Addendum)
Following a fall with extensive left thigh hematoma ?Patient noted to have a 4 g drop in his H&H ?Monitor H&H closely ?Transfuse as needed ?

## 2021-10-04 NOTE — Progress Notes (Signed)
Patient brought into office by his wife for routine visit, but very unsteady, hardly able to get back to room.  Once at doorway to room was unable to continue to stand.  Gently lowered to floor by CMA ([place]), wife, and PCP.  PCP sat be him and supported his upper body on legs while lying on floor.  Able to keep head elevated. ? ?Patient and wife then reported he had a fall on Wednesday while in [place].  Lost his footing and landed on left side at that time, reports significant bruising to upper left leg and hip area + is worried he fractured ribs.  Wife reports he refused to go to ER and insisted on coming to provider office.  She reports he has not been himself since the fall.  On assessment this morning he is pallor then baseline and not himself. Is oriented to person/place/time, but very weak and in pain.  EMS called to office.   ? ?HR apical 50, unable to attain BP reading or O2 sat.  EMS arrive at approx 0840 and took patient to ER.  Wife stated appreciation for assistance.  Discussed with patient and wife concern for fracture and possible PNA if rib fractures. ?

## 2021-10-04 NOTE — ED Notes (Signed)
This RN called & spoke with dietary services about pt's new diet order. Dietary states that a tray went out to pt at 1336. ?

## 2021-10-04 NOTE — ED Notes (Signed)
Pt not in room at this time

## 2021-10-04 NOTE — ED Notes (Signed)
RN flagged down by MD Agbata. Asked to DC Heparin drip until further. Will make assigned RN aware.  ?

## 2021-10-04 NOTE — ED Notes (Signed)
Date and time results received: 10/04/21 1038 ?(use smartphrase ".now" to insert current time) ? ?Test: trop ?Critical Value: 725 ? ?Name of Provider Notified: Dr. Kerman Passey ? ?Orders Received? Or Actions Taken?: will continued to monitor. ?

## 2021-10-04 NOTE — ED Notes (Signed)
Medications not yet verified by pharmacy. ?

## 2021-10-04 NOTE — ED Notes (Signed)
Pt asked for CPAP at night time. MD made aware.  ?

## 2021-10-04 NOTE — ED Notes (Addendum)
This RN received critical Lactic acid 3.4 from [place] in lab. Dr. Kerman Passey notified. ?

## 2021-10-05 ENCOUNTER — Encounter: Payer: Self-pay | Admitting: Internal Medicine

## 2021-10-05 DIAGNOSIS — I480 Paroxysmal atrial fibrillation: Secondary | ICD-10-CM | POA: Diagnosis not present

## 2021-10-05 DIAGNOSIS — D6489 Other specified anemias: Secondary | ICD-10-CM | POA: Diagnosis not present

## 2021-10-05 DIAGNOSIS — R531 Weakness: Secondary | ICD-10-CM | POA: Diagnosis not present

## 2021-10-05 LAB — CBC
HCT: 27.6 % — ABNORMAL LOW (ref 39.0–52.0)
Hemoglobin: 8.6 g/dL — ABNORMAL LOW (ref 13.0–17.0)
MCH: 30.7 pg (ref 26.0–34.0)
MCHC: 31.2 g/dL (ref 30.0–36.0)
MCV: 98.6 fL (ref 80.0–100.0)
Platelets: 122 10*3/uL — ABNORMAL LOW (ref 150–400)
RBC: 2.8 MIL/uL — ABNORMAL LOW (ref 4.22–5.81)
RDW: 12.6 % (ref 11.5–15.5)
WBC: 5.5 10*3/uL (ref 4.0–10.5)
nRBC: 0 % (ref 0.0–0.2)

## 2021-10-05 LAB — BASIC METABOLIC PANEL
Anion gap: 5 (ref 5–15)
BUN: 42 mg/dL — ABNORMAL HIGH (ref 8–23)
CO2: 26 mmol/L (ref 22–32)
Calcium: 8.4 mg/dL — ABNORMAL LOW (ref 8.9–10.3)
Chloride: 106 mmol/L (ref 98–111)
Creatinine, Ser: 1.67 mg/dL — ABNORMAL HIGH (ref 0.61–1.24)
GFR, Estimated: 44 mL/min — ABNORMAL LOW (ref >60)
Glucose, Bld: 149 mg/dL — ABNORMAL HIGH (ref 70–99)
Potassium: 4.6 mmol/L (ref 3.5–5.1)
Sodium: 137 mmol/L (ref 135–145)

## 2021-10-05 LAB — HEMOGLOBIN A1C
Hgb A1c MFr Bld: 8 % — ABNORMAL HIGH (ref 4.8–5.6)
Mean Plasma Glucose: 182.9 mg/dL

## 2021-10-05 LAB — HIV ANTIBODY (ROUTINE TESTING W REFLEX): HIV Screen 4th Generation wRfx: NONREACTIVE

## 2021-10-05 LAB — GLUCOSE, CAPILLARY
Glucose-Capillary: 129 mg/dL — ABNORMAL HIGH (ref 70–99)
Glucose-Capillary: 158 mg/dL — ABNORMAL HIGH (ref 70–99)

## 2021-10-05 MED ORDER — TELMISARTAN 80 MG PO TABS: ORAL_TABLET | ORAL | 4 refills | Status: DC

## 2021-10-05 MED ORDER — APIXABAN 5 MG PO TABS
ORAL_TABLET | ORAL | 11 refills | Status: DC
Start: 2021-10-05 — End: 2021-10-11

## 2021-10-05 NOTE — Care Management CC44 (Signed)
Condition Code 44 Documentation Completed ? ?Patient Details  ?Name: Andrew Cobb ?MRN: [id number] ?Date of Birth: 1950-11-28 ? ? ?Condition Code 44 given:  Yes ?Patient signature on Condition Code 44 notice:  Yes ?Documentation of 2 MD's agreement:  Yes ?Code 44 added to claim:  Yes ? ? ? ?Izola Price, RN ?10/05/2021, 11:51 AM ? ?

## 2021-10-05 NOTE — Progress Notes (Signed)
Occupational Therapy * Physical Therapy * Speech Therapy ?     ? ?DATE:10/05/21 ?PATIENT NAME_MRN:  Andrew Cobb ?PATIENT MRN: [id number] ? ?DIAGNOSIS/DIAGNOSIS CODE:  ?DATE OF DISCHARGE: 10/05/2021 ? ?Primary Care Provider Dr. Marnee Guarneri  ?[address], [place], [place] [address] ?PCP Phone#: [phone] ? ? ?Dear Provider: [facility]  ?Fax: [phone] ?  ?I certify that I have examined this patient and that occupational/physical/speech therapy is necessary on an outpatient basis.   ? ?The patient has expressed interest in completing their recommended course of therapy at your location.  Once a formal order from the patient's primary care physician has been obtained, please contact him/her to schedule an appointment for evaluation at your earliest convenience. ? ? ?[  X]  Physical Therapy Evaluate and Treat ? ?        [  ]  Occupational Therapy Evaluate and Treat ? ?                                  [  ]  Speech Therapy Evaluate and Treat ? ?The patient's primary care physician (listed above) must furnish and be responsible for a formal order such that the recommended services may be furnished while under the primary physician's care, and that the plan of care will be established and reviewed every 30 days (or more often if condition necessitates).  ?

## 2021-10-05 NOTE — Evaluation (Signed)
Physical Therapy Evaluation ?Patient Details ?Name: Andrew Cobb ?MRN: [id number] ?DOB: 23-May-1951 ?Today's Date: 10/05/2021 ? ?History of Present Illness ? Pt is a 71 yo male that presented from his PCP with falls, weakness, and dizziness. Workup showed hypotension, anemia, lactic acidosis, AKI and elevated troponin as well as imaging reported "There is disruption of cortical margins in the anterior left seventh and eighth ribs suggesting recent undisplaced fractures" PMH of paroxysmal A-fib on chronic anticoagulation therapy with Eliquis, coronary artery disease status post stent angioplasty, diabetes mellitus, GERD, history of CVA, sleep apnea. ?  ?Clinical Impression ? Patient alert, oriented x4, family at bedside. Reported 5/10 L hip pain due to recent falls. He stated at baseline he is independent, works part time, lives with his wife in a one story home. ? ?The patient was able to perform bed mobility modI. Sit <> Stand with supervision, and ambulated ~88f with CGA-supervision, no AD. Pt did have decreased step height/length, as well as velocity, pt cited L hip pain as a cause for moving slower/having  more trouble than usual. Orthostatic vitals performed, pt POSITIVE, RN and MD notified, see vitals sections for details.  Overall the patient demonstrated deficits (see "PT Problem List") that impede the patient's functional abilities, safety, and mobility and would benefit from skilled PT intervention. Recommendation at this time is outpatient PT to return pt to PLOF and address L hip complaints.  ?   ?   ? ?Recommendations for follow up therapy are one component of a multi-disciplinary discharge planning process, led by the attending physician.  Recommendations may be updated based on patient status, additional functional criteria and insurance authorization. ? ?Follow Up Recommendations Outpatient PT ? ?  ?Assistance Recommended at Discharge Intermittent Supervision/Assistance  ?Patient can return home with  the following ?   ? ?  ?Equipment Recommendations None recommended by PT  ?Recommendations for Other Services ?    ?  ?Functional Status Assessment Patient has had a recent decline in their functional status and demonstrates the ability to make significant improvements in function in a reasonable and predictable amount of time.  ? ?  ?Precautions / Restrictions Precautions ?Precautions: Fall ?Restrictions ?Weight Bearing Restrictions: No  ? ?  ? ?Mobility ? Bed Mobility ?Overal bed mobility: Modified Independent ?  ?  ?  ?  ?  ?  ?  ?  ? ?Transfers ?Overall transfer level: Needs assistance ?Equipment used: Rolling walker (2 wheels) ?Transfers: Sit to/from Stand ?Sit to Stand: Supervision ?  ?  ?  ?  ?  ?  ?  ? ?Ambulation/Gait ?Ambulation/Gait assistance: Min guard, Supervision ?Gait Distance (Feet): 90 Feet ?Assistive device: None ?  ?  ?  ?  ?General Gait Details: slow, decreased cadence, decreased step length/height ? ?Stairs ?  ?  ?  ?  ?  ? ?Wheelchair Mobility ?  ? ?Modified [name] (Stroke Patients Only) ?  ? ?  ? ?Balance Overall balance assessment: Needs assistance ?Sitting-balance support: Feet supported ?Sitting balance-Leahy Scale: Normal ?  ?  ?Standing balance support: No upper extremity supported ?Standing balance-Leahy Scale: Good ?  ?  ?  ?  ?  ?  ?  ?  ?  ?  ?  ?  ?   ? ? ? ?Pertinent Vitals/Pain Pain Assessment ?Pain Assessment: 0-10 ?Pain Score: 5  ?Pain Location: L hip ?Pain Descriptors / Indicators: Aching, Sore ?Pain Intervention(s): Limited activity within patient's tolerance, Monitored during session, Repositioned  ? ? ?Home Living Family/patient expects  to be discharged to:: Private residence ?Living Arrangements: Spouse/significant other ?Available Help at Discharge: Family;Available 24 hours/day ?Type of Home: House ?Home Access: Stairs to enter ?Entrance Stairs-Rails: Left;Right (trellis to hold onto) ?Entrance Stairs-Number of Steps: 2 ?  ?Home Layout: One level ?Home Equipment: Chartered certified accountant (2 wheels);Cane - single point ?Additional Comments: no other falls in the last 6 months prior to falls leading to admission  ?  ?Prior Function Prior Level of Function : Independent/Modified Independent;Driving;Working/employed ?  ?  ?  ?  ?  ?  ?Mobility Comments: independent ?  ?  ? ? ?Hand Dominance  ?   ? ?  ?Extremity/Trunk Assessment  ? Upper Extremity Assessment ?Upper Extremity Assessment: Defer to OT evaluation ?  ? ?Lower Extremity Assessment ?Lower Extremity Assessment: Generalized weakness ?  ? ?Cervical / Trunk Assessment ?Cervical / Trunk Assessment: Normal  ?Communication  ? Communication: HOH  ?Cognition Arousal/Alertness: Awake/alert ?Behavior During Therapy: [facility] for tasks assessed/performed ?Overall Cognitive Status: Within Functional Limits for tasks assessed ?  ?  ?  ?  ?  ?  ?  ?  ?  ?  ?  ?  ?  ?  ?  ?  ?  ?  ?  ? ?  ?General Comments   ? ?  ?Exercises    ? ?Assessment/Plan  ?  ?PT Assessment Patient needs continued PT services  ?PT Problem List Decreased mobility;Decreased activity tolerance;Decreased balance;Pain ? ?   ?  ?PT Treatment Interventions DME instruction;Therapeutic exercise;Gait training;Balance training;Stair training;Neuromuscular re-education;Functional mobility training;Therapeutic activities;Patient/family education   ? ?PT Goals (Current goals can be found in the Care Plan section)  ?Acute Rehab PT Goals ?Patient Stated Goal: to go home ?PT Goal Formulation: With patient ?Time For Goal Achievement: 10/19/21 ?Potential to Achieve Goals: Good ?Additional Goals ?Additional Goal #1: The patient will ambulate >1042f with 6 minute walk test to indicate ability to participate in community/recreational activities/work duties ? ?  ?Frequency Min 2X/week ?  ? ? ?Co-evaluation   ?  ?  ?  ?  ? ? ?  ?AM-PAC PT "6 Clicks" Mobility  ?Outcome Measure Help needed turning from your back to your side while in a flat bed without using bedrails?: None ?Help needed moving from lying on  your back to sitting on the side of a flat bed without using bedrails?: None ?Help needed moving to and from a bed to a chair (including a wheelchair)?: None ?Help needed standing up from a chair using your arms (e.g., wheelchair or bedside chair)?: None ?Help needed to walk in hospital room?: A Little ?Help needed climbing 3-5 steps with a railing? : A Little ?6 Click Score: 22 ? ?  ?End of Session Equipment Utilized During Treatment: Gait belt ?Activity Tolerance: Patient tolerated treatment well ?Patient left: in bed;with call bell/phone within reach;with bed alarm set;with family/visitor present ?Nurse Communication: Mobility status;Other (comment) (orthostatics) ?PT Visit Diagnosis: Other abnormalities of gait and mobility (R26.89);History of falling (Z91.81);Pain ?Pain - Right/Left: Left ?Pain - part of body: Hip ?  ? ?Time: [address]?PT Time Calculation (min) (ACUTE ONLY): 25 min ? ? ?Charges:   PT Evaluation ?$PT Eval Low Complexity: 1 Low ?PT Treatments ?$Therapeutic Activity: 8-22 mins ?  ?   ? ? ?DLieutenant DiegoPT, DPT ?11:04 AM,10/05/21 ? ? ?

## 2021-10-05 NOTE — TOC Transition Note (Addendum)
Transition of Care (TOC) - CM/SW Discharge Note ? ? ?Patient Details  ?Name: Andrew Cobb ?MRN: [id number] ?Date of Birth: 24-Jan-1951 ? ?Transition of Care (TOC) CM/SW Contact:  ?Izola Price, RN ?Phone Number: ?10/05/2021, 11:53 AM ? ? ?Clinical Narrative: 4/1: Patient discharging today to home with outpatient therapy orders. Also, Code-44 completed speaking to spouse present at bedside as patient is [facility]. Spouse gave permission for RN CM to e-sign Code-44 and printed to unit to be given to patient/spouse and notified Unit RN.  ? ?Discussed outpatient options and patient chooses [place], [name] for outpatient therapy. Order to be signed sent via Epic progress note to discharging provider. Discussed with spouse for facility to check orders or have PCP call them in of paper order/electronic order is not delivered prior to  leaving building.  ? ?Spouse has used this facility before and was well aware and comfortable contacting this outpatient facility. No further questions or concerns regarding discharge plan/needs. Simmie Davies RN CM  ? ? ? ?Final next level of care: OP Rehab ?Barriers to Discharge: Barriers Resolved ? ? ?Patient Goals and CMS Choice ?  ?  ?Choice offered to / list presented to : NA ? ?Discharge Placement ?  ?           ?  ?  ?  ?  ? ?Discharge Plan and Services ?  ?  ?Post Acute Care Choice: NA          ?DME Arranged: N/A ?DME Agency: NA ?  ?  ?  ?HH Arranged: NA ?[place] Agency: NA ?  ?  ?  ? ?Social Determinants of Health (SDOH) Interventions ?  ? ? ?Readmission Risk Interventions ?   ? View : No data to display.  ?  ?  ?  ? ? ? ? ? ?

## 2021-10-05 NOTE — Progress Notes (Addendum)
Cecilio Asper Lao to be D/C'd Home per MD order.  Discussed prescriptions and follow up appointments with the patient and wife. Prescriptions given to patient, medication list explained in detail. Pt verbalized understanding. Wife here to transport pt home. ? ?Allergies as of 10/05/2021   ? ?   Reactions  ? Succinylsulphathiazole Rash  ? Sulfamethoxazole-trimethoprim Rash  ? Tetracyclines & Related Rash  ? ?  ? ?  ?Medication List  ?  ? ?STOP taking these medications   ? ?betamethasone dipropionate 0.05 % ointment ?Commonly known as: DIPROLENE ?  ?clobetasol ointment 0.05 % ?Commonly known as: TEMOVATE ?  ? ?  ? ?TAKE these medications   ? ?acyclovir 400 MG tablet ?Commonly known as: Zovirax ?Take 1 tablet (400 mg total) by mouth 2 (two) times daily. ?  ?albuterol 108 (90 Base) MCG/ACT inhaler ?Commonly known as: VENTOLIN HFA ?Inhale 2 puffs into the lungs every 6 (six) hours as needed for wheezing or shortness of breath. ?  ?amitriptyline 25 MG tablet ?Commonly known as: ELAVIL ?Take 1 tablet (25 mg total) by mouth at bedtime. ?  ?apixaban 5 MG Tabs tablet ?Commonly known as: ELIQUIS ?Hold until followup with PCP due to the hematoma in your thigh. ?What changed:  ?how much to take ?how to take this ?when to take this ?additional instructions ?  ?atorvastatin 40 MG tablet ?Commonly known as: LIPITOR ?Take 1 tablet (40 mg total) by mouth at bedtime. ?  ?B-12 5000 MCG Caps ?Take 5,000 mcg by mouth every other day. ?  ?cetirizine 10 MG tablet ?Commonly known as: ZYRTEC ?Take 10 mg by mouth daily. ?  ?empagliflozin 10 MG Tabs tablet ?Commonly known as: Jardiance ?Take 1 tablet (10 mg total) by mouth daily before breakfast. ?  ?fluticasone 50 MCG/ACT nasal spray ?Commonly known as: FLONASE ?Place 2 sprays into both nostrils daily. ?  ?levETIRAcetam 500 MG tablet ?Commonly known as: KEPPRA ?Take 1 tablet (500 mg total) by mouth 2 (two) times daily. ?  ?metFORMIN 1000 MG tablet ?Commonly known as: GLUCOPHAGE ?Take 1 tablet  (1,000 mg total) by mouth 2 (two) times daily with a meal. ?  ?MULTIVITAMIN PO ?Take 1 tablet by mouth daily. ?  ?OneTouch Ultra test strip ?Generic drug: glucose blood ?1 each by Other route 2 (two) times daily. Use as instructed ?  ?onetouch ultrasoft lancets ?USE 1  TO CHECK GLUCOSE TWICE DAILY ?  ?tamsulosin 0.4 MG Caps capsule ?Commonly known as: Flomax ?Take 1 capsule (0.4 mg total) by mouth at bedtime. ?  ?telmisartan 80 MG tablet ?Commonly known as: MICARDIS ?Hold until followup with your PCP due to acute kidney injury and soft blood pressure. ?What changed:  ?how much to take ?how to take this ?when to take this ?additional instructions ?  ?Vitamin D 50 MCG (2000 UT) Caps ?Take 2,000 Units by mouth daily. ?  ? ?  ? ? ?Vitals:  ? 10/05/21 1100 10/05/21 1145  ?BP: 126/69 (!) 120/91  ?Pulse: 73 67  ?Resp:  19  ?Temp:  98.6 ?F (37 ?C)  ?SpO2:  96%  ? ? ?Tele box removed and returned.Skin clean, dry and intact without evidence of skin break down, no evidence of skin tears noted. IV catheter discontinued intact. Site without signs and symptoms of complications. Dressing and pressure applied. Pt denies pain at this time. No complaints noted. ? ?An After Visit Summary was printed and given to the patient. ?Patient escorted via [place], and D/C home via private auto. ? ?Rhona Raider  Alba Destine  ?

## 2021-10-05 NOTE — Discharge Summary (Signed)
? ?Physician Discharge Summary ? ? ?Andrew Cobb  male DOB: 02/13/1951  ?ONG:[id number] ? ?PCP: [name], NP ? ?Admit date: 10/04/2021 ?Discharge date: 10/05/2021 ? ?Admitted From: home ?Disposition:  home ?Wife updated at bedside prior to discharge. ? ?Home Health: outpatient PT ?CODE STATUS: Full code ? ?Discharge Instructions   ? ? Discharge instructions   Complete by: As directed ?  ? Please hold Eliquis and Telmisartan until followup with your PCP. ? ?Please see outpatient cardiology in 3 months to discuss alternative to Eliquis. ? ?Your blood pressure drops when you stand up, but you are not symptomatic, so just be aware and use a walker for now to be safe and prevent further falls. ? ? ?Dr. Enzo Bi ?- ?-  ? ?  ? ?Hospital Course:  ?For full details, please see H&P, progress notes, consult notes and ancillary notes.  ?Briefly,  ?Andrew Cobb is a 71 y.o. male with medical history significant for paroxysmal A-fib on Eliquis, CAD s/p stent angioplasty, diabetes mellitus, history of CVA, sleep apnea who presented to the ER by EMS from his primary care provider's office. ? ?Patient states that he fell 2 days prior to presenting to the hospital.  He was stepping out of his car and missed a step landing on his left side.  He has extensive bruising to his left lower extremity following the fall.  Since then he has had multiple falls at home and states that he gets dizzy and lightheaded prior to the falls and is very weak.  His wife took him to see his primary care provider and patient was unable to ambulate from the doorway into the room.  EMS was called and patient was transported to the ER. ? ?Upon arrival to the ER he was hypotensive with blood pressure 78/57.  He received a 2 L fluid bolus with improvement in his blood pressure. ?Labs reveal a 4 g drop in his H&H.  3 months ago patient had a hemoglobin of 13.3 and on admission it was 9.3. ? ?* Weakness generalized ?Orthostasis ?--appears to be  secondary to acute blood loss anemia following a fall. ?--Pt did well with PT eval and was cleared for discharge home, and pt felt ready to go home.  Pt was noted to have orthostasis from sitting to standing, however, asymptomatic and was able to walk.  Pt was advised to be aware and to use a walker for now to be safe and prevent further falls. ? ?ABLA (acute blood loss anemia) 2/2 ?Left thigh hematoma ?Following a fall with extensive left thigh hematoma due to being on blood thinner. ?Patient noted to have a 4 g drop in his H&H.   ?--Hgb 8.6 prior to discharge.  Home eliquis held pending PCP followup. ?  ?Hypotension, resolved ?Unclear etiology but most likely secondary to acute blood loss anemia and concomitant antihypertensive medication use. ?--BP improved with IVF. ?--home telmisartan held pending PCP followup. ?  ?AKI (acute kidney injury) ([place]) ?Patient has a baseline serum creatinine of 1.27 and on admission it was 2.89 ?AKI appears to be secondary to ATN from hypotension related to acute blood loss anemia and concomitant ARB use. ?--Cr improved with IVF, was 1.67 prior to discharge. ?--home telmisartan held pending PCP followup. ?  ?Elevated troponin 2/2 demand ischemia ?--trop 900's, flat. ?--No chest pain.  Echo showed normal LVEF, no WMA, G1DD, no valvular abnormality. ?--cardiology consulted, noted Elevated troponin may be from demand ischemia in the setting of anemia,  hypotension, AKI. ? ?Paroxysmal atrial fibrillation ([place]) ?Patient is currently in sinus rhythm ?Home eliquis held pending PCP followup. ?  ?Diabetes mellitus without complication ([place]) ?--discharged back on home diabetic regimen as below ?  ?Hx of Seizures ([place]) ?Continue home [place] ? ? ?Discharge Diagnoses:  ?Principal Problem: ?  Weakness generalized ?Active Problems: ?  ABLA (acute blood loss anemia) ?  Hypotension ?  AKI (acute kidney injury) ([place]) ?  Elevated troponin ?  Seizures ([place]) ?  Diabetes mellitus without complication  ([place]) ?  Paroxysmal atrial fibrillation ([facility]) ? ? ?30 Day Unplanned Readmission Risk Score   ? ?Flowsheet Row ED to Hosp-Admission (Current) from 10/04/2021 in [facility]  ?30 Day Unplanned Readmission Risk Score (%) 12.97 Filed at 10/05/2021 0801  ? ?  ? ? This score is the patient's risk of an unplanned readmission within 30 days of being discharged (0 -100%). The score is based on dignosis, age, lab data, medications, orders, and past utilization.   ?Low:  0-14.9   Medium: 15-21.9   High: 22-29.9   Extreme: 30 and above ? ?  ? ?  ? ? ?Discharge Instructions: ? ?Allergies as of 10/05/2021   ? ?   Reactions  ? Succinylsulphathiazole Rash  ? Sulfamethoxazole-trimethoprim Rash  ? Tetracyclines & Related Rash  ? ?  ? ?  ?Medication List  ?  ? ?STOP taking these medications   ? ?betamethasone dipropionate 0.05 % ointment ?Commonly known as: DIPROLENE ?  ?clobetasol ointment 0.05 % ?Commonly known as: TEMOVATE ?  ? ?  ? ?TAKE these medications   ? ?acyclovir 400 MG tablet ?Commonly known as: Zovirax ?Take 1 tablet (400 mg total) by mouth 2 (two) times daily. ?  ?albuterol 108 (90 Base) MCG/ACT inhaler ?Commonly known as: VENTOLIN HFA ?Inhale 2 puffs into the lungs every 6 (six) hours as needed for wheezing or shortness of breath. ?  ?amitriptyline 25 MG tablet ?Commonly known as: ELAVIL ?Take 1 tablet (25 mg total) by mouth at bedtime. ?  ?apixaban 5 MG Tabs tablet ?Commonly known as: ELIQUIS ?Hold until followup with PCP due to the hematoma in your thigh. ?What changed:  ?how much to take ?how to take this ?when to take this ?additional instructions ?  ?atorvastatin 40 MG tablet ?Commonly known as: LIPITOR ?Take 1 tablet (40 mg total) by mouth at bedtime. ?  ?B-12 5000 MCG Caps ?Take 5,000 mcg by mouth every other day. ?  ?cetirizine 10 MG tablet ?Commonly known as: ZYRTEC ?Take 10 mg by mouth daily. ?  ?empagliflozin 10 MG Tabs tablet ?Commonly known as: Jardiance ?Take 1 tablet (10 mg total) by  mouth daily before breakfast. ?  ?fluticasone 50 MCG/ACT nasal spray ?Commonly known as: FLONASE ?Place 2 sprays into both nostrils daily. ?  ?levETIRAcetam 500 MG tablet ?Commonly known as: KEPPRA ?Take 1 tablet (500 mg total) by mouth 2 (two) times daily. ?  ?metFORMIN 1000 MG tablet ?Commonly known as: GLUCOPHAGE ?Take 1 tablet (1,000 mg total) by mouth 2 (two) times daily with a meal. ?  ?MULTIVITAMIN PO ?Take 1 tablet by mouth daily. ?  ?OneTouch Ultra test strip ?Generic drug: glucose blood ?1 each by Other route 2 (two) times daily. Use as instructed ?  ?onetouch ultrasoft lancets ?USE 1  TO CHECK GLUCOSE TWICE DAILY ?  ?tamsulosin 0.4 MG Caps capsule ?Commonly known as: Flomax ?Take 1 capsule (0.4 mg total) by mouth at bedtime. ?  ?telmisartan 80 MG tablet ?Commonly known  as: MICARDIS ?Hold until followup with your PCP due to acute kidney injury and soft blood pressure. ?What changed:  ?how much to take ?how to take this ?when to take this ?additional instructions ?  ?Vitamin D 50 MCG (2000 UT) Caps ?Take 2,000 Units by mouth daily. ?  ? ?  ? ? ? Follow-up Information   ? ? Marnee Guarneri T, NP Follow up in 1 week(s).   ?Specialty: Nurse Practitioner ?Why: Check hemoglobin, kidney function, and orthostatic blood pressure. ?Contact information: ?[address] ?[place] [address] ?[phone] ? ? ?  ?  ? ? Evans Lance, MD .   ?Specialty: Cardiology ?Contact information: ?[address] ?[address] ?[place] [address] ?[phone] ? ? ?  ?  ? ?  ?  ? ?  ? ? ?Allergies  ?Allergen Reactions  ? Succinylsulphathiazole Rash  ? Sulfamethoxazole-Trimethoprim Rash  ? Tetracyclines & Related Rash  ? ? ? ?The results of significant diagnostics from this hospitalization (including imaging, microbiology, ancillary and laboratory) are listed below for reference.  ? ?Consultations: ? ? ?Procedures/Studies: ?DG Ribs Unilateral Left ? ?Result Date: 10/04/2021 ?CLINICAL DATA:  Left chest pain EXAM: LEFT RIBS - 2  VIEW COMPARISON:  None. FINDINGS: There is radiolucent line with sclerotic margins in the posterior left tenth rib. There is mild deformity in the posterior left eleventh rib. There is disruption of cortical margin

## 2021-10-05 NOTE — Progress Notes (Signed)
? ?Progress Note ? ?Patient Name: Andrew Cobb ?Date of Encounter: 10/05/2021 ? ?[facility] Cardiologist: New ? ?Subjective  ? ?Hgb down to 8.6. remains in NSR. He feels OK. Wondering when he can go home. Kidney function is improved.  ? ?Inpatient Medications  ?  ?Scheduled Meds: ? amitriptyline  25 mg Oral QHS  ? atorvastatin  40 mg Oral QHS  ? cholecalciferol  2,000 Units Oral Daily  ? insulin aspart  0-15 Units Subcutaneous TID WC  ? levETIRAcetam  500 mg Oral BID  ? loratadine  10 mg Oral Daily  ? multivitamin with minerals  1 tablet Oral Daily  ? sodium chloride flush  3 mL Intravenous Q12H  ? tamsulosin  0.4 mg Oral QHS  ? vitamin B-12  5,000 mcg Oral QODAY  ? ?Continuous Infusions: ? sodium chloride    ? ?PRN Meds: ?sodium chloride, albuterol, fluticasone, ondansetron **OR** ondansetron (ZOFRAN) IV, sodium chloride flush, traMADol  ? ?Vital Signs  ?  ?Vitals:  ? 10/04/21 2100 10/04/21 2256 10/05/21 0111 10/05/21 0456  ?BP:   109/62 113/64  ?Pulse:   73 63  ?Resp:   20 20  ?Temp:   (!) 97.5 ?F (36.4 ?C) 97.6 ?F (36.4 ?C)  ?TempSrc:      ?SpO2:  100% 100% 100%  ?Weight: 94.4 kg     ?Height:      ? ? ?Intake/Output Summary (Last 24 hours) at 10/05/2021 0707 ?Last data filed at 10/05/2021 0115 ?Gross per 24 hour  ?Intake 2700 ml  ?Output 800 ml  ?Net 1900 ml  ? ? ?  10/04/2021  ?  9:00 PM 10/04/2021  ?  9:38 AM  ?Last 3 Weights  ?Weight (lbs) 208 lb 1.8 oz 211 lb 6.4 oz  ?Weight (kg) 94.4 kg 95.89 kg  ?   ? ?Telemetry  ?  ?NSR HR 60s, first degree AV block - Personally Reviewed ? ?ECG  ?  ?No new - Personally Reviewed ? ?Physical Exam  ? ?GEN: No acute distress.   ?Neck: No JVD ?Cardiac: RRR, no murmurs, rubs, or gallops.  ?Respiratory: Clear to auscultation bilaterally. ?GI: Soft, nontender, non-distended  ?MS: No edema; left sided hematoma ?Neuro:  Nonfocal  ?Psych: Normal affect  ? ?Labs  ?  ?High Sensitivity Troponin:   ?Recent Labs  ?Lab 10/04/21 ?0926 10/04/21 ?1158 10/04/21 ?1446  ?TROPONINIHS 725* 706*   923* 982*  ?   ?Chemistry ?Recent Labs  ?Lab 10/04/21 ?0926  ?NA 136  ?K 4.2  ?CL 104  ?CO2 21*  ?GLUCOSE 188*  ?BUN 49*  ?CREATININE 2.89*  ?CALCIUM 8.2*  ?PROT 6.0*  ?ALBUMIN 3.8  ?AST 25  ?ALT 26  ?ALKPHOS 52  ?BILITOT 1.3*  ?GFRNONAA 23*  ?ANIONGAP 11  ?  ?Lipids No results for input(s): CHOL, TRIG, HDL, LABVLDL, LDLCALC, CHOLHDL in the last 168 hours.  ?Hematology ?Recent Labs  ?Lab 10/04/21 ?0926 10/05/21 ?0451  ?WBC 7.7 5.5  ?RBC 3.07* 2.80*  ?HGB 9.3* 8.6*  ?HCT 30.0* 27.6*  ?MCV 97.7 98.6  ?MCH 30.3 30.7  ?MCHC 31.0 31.2  ?RDW 12.9 12.6  ?PLT 126* 122*  ? ?Thyroid No results for input(s): TSH, FREET4 in the last 168 hours.  ?BNPNo results for input(s): BNP, PROBNP in the last 168 hours.  ?DDimer No results for input(s): DDIMER in the last 168 hours.  ? ?Radiology  ?  ?DG Ribs Unilateral Left ? ?Result Date: 10/04/2021 ?CLINICAL DATA:  Left chest pain EXAM: LEFT RIBS - 2 VIEW  COMPARISON:  None. FINDINGS: There is radiolucent line with sclerotic margins in the posterior left tenth rib. There is mild deformity in the posterior left eleventh rib. There is disruption of cortical margins in the anterior aspect of left seventh and eighth ribs. There is implantable cardiac monitoring device in the left chest wall. There is left shoulder arthroplasty. There is surgical fusion in the cervical spine. IMPRESSION: There is disruption of cortical margins in the anterior left seventh and eighth ribs suggesting recent undisplaced fractures. Deformity is seen in the posterior left tenth and eleventh ribs suggesting possible old healing fractures. Electronically Signed   By: Elmer Picker M.D.   On: 10/04/2021 13:15  ? ?CT HEAD WO CONTRAST (5MM) ? ?Result Date: 10/04/2021 ?CLINICAL DATA:  Head trauma, minor (Age >= 65y) EXAM: CT HEAD WITHOUT CONTRAST TECHNIQUE: Contiguous axial images were obtained from the base of the skull through the vertex without intravenous contrast. RADIATION DOSE REDUCTION: This exam was  performed according to the departmental dose-optimization program which includes automated exposure control, adjustment of the mA and/or kV according to patient size and/or use of iterative reconstruction technique. COMPARISON:  CT head Nov 12, 2020. FINDINGS: Brain: Remote left MCA territory infarcts. Additional patchy white matter hypoattenuation, nonspecific but compatible with chronic microvascular ischemic disease. No evidence of acute large vascular territory infarct or acute hemorrhage. Vascular: No hyperdense vessel identified. Calcific intracranial atherosclerosis. Skull: No acute fracture. Sinuses/Orbits: Mild paranasal sinus mucosal thickening. No acute orbital findings. Other: No mastoid effusions. IMPRESSION: 1. No evidence of acute intracranial abnormality. 2. Remote left MCA territory infarcts and chronic microvascular ischemic disease. Electronically Signed   By: Margaretha Sheffield M.D.   On: 10/04/2021 10:53  ? ?DG Chest Portable 1 View ? ?Result Date: 10/04/2021 ?CLINICAL DATA:  Weakness multiple falls x3 days. EXAM: PORTABLE CHEST 1 VIEW COMPARISON:  Nov 12, 2020 FINDINGS: Cardiac loop recorder overlies the left chest. The heart size and mediastinal contours are within normal limits. No focal airspace consolidation. No visible pleural effusion or pneumothorax. Bilateral shoulder arthroplasties. Cervical fusion hardware. IMPRESSION: No acute cardiopulmonary process. Electronically Signed   By: Dahlia Bailiff M.D.   On: 10/04/2021 10:35  ? ?ECHOCARDIOGRAM COMPLETE ? ?Result Date: 10/04/2021 ?   ECHOCARDIOGRAM REPORT   Patient Name:   Andrew Cobb Date of Exam: 10/04/2021 Medical Rec #:  [id number]        Height:       69.0 in Accession #:    [id number]       Weight:       211.4 lb Date of Birth:  03/15/1951        BSA:          2.115 m? Patient Age:    71 years         BP:           110/64 mmHg Patient Gender: M                HR:           80 bpm. Exam Location:  [facility] Procedure: 2D Echo, Cardiac  Doppler and Color Doppler Indications:     Elevated Troponin  History:         Patient has prior history of Echocardiogram examinations, most                  recent 09/20/2018. CAD; Risk Factors:Hypertension and Diabetes.  Sonographer:     Sherrie Sport Referring Phys:  [id number] Faith  Kathalina Ostermann Diagnosing Phys: Ida Rogue MD  Sonographer Comments: Technically challenging study due to limited acoustic windows, suboptimal parasternal window and suboptimal apical window. Pt fell and was kept supine for echo. IMPRESSIONS  1. Left ventricular ejection fraction, by estimation, is 60 to 65%. The left ventricle has normal function. The left ventricle has no regional wall motion abnormalities. There is mild left ventricular hypertrophy. Left ventricular diastolic parameters are consistent with Grade I diastolic dysfunction (impaired relaxation).  2. Right ventricular systolic function is normal. The right ventricular size is normal.  3. The mitral valve is normal in structure. No evidence of mitral valve regurgitation. No evidence of mitral stenosis.  4. The aortic valve is normal in structure. Aortic valve regurgitation is not visualized. No aortic stenosis is present.  5. The inferior vena cava is normal in size with greater than 50% respiratory variability, suggesting right atrial pressure of 3 mmHg. FINDINGS  Left Ventricle: Left ventricular ejection fraction, by estimation, is 60 to 65%. The left ventricle has normal function. The left ventricle has no regional wall motion abnormalities. The left ventricular internal cavity size was normal in size. There is  mild left ventricular hypertrophy. Left ventricular diastolic parameters are consistent with Grade I diastolic dysfunction (impaired relaxation). Right Ventricle: The right ventricular size is normal. No increase in right ventricular wall thickness. Right ventricular systolic function is normal. Left Atrium: Left atrial size was normal in size. Right Atrium: Right  atrial size was normal in size. Pericardium: There is no evidence of pericardial effusion. Mitral Valve: The mitral valve is normal in structure. There is mild thickening of the mitral valve leaflet(s). Mild mitral

## 2021-10-07 ENCOUNTER — Encounter: Payer: Self-pay | Admitting: Nurse Practitioner

## 2021-10-07 ENCOUNTER — Telehealth: Payer: Self-pay | Admitting: *Deleted

## 2021-10-07 DIAGNOSIS — M25551 Pain in right hip: Secondary | ICD-10-CM | POA: Diagnosis not present

## 2021-10-07 DIAGNOSIS — M79604 Pain in right leg: Secondary | ICD-10-CM | POA: Diagnosis not present

## 2021-10-07 MED ORDER — LIDOCAINE 5 % EX PTCH
1.0000 | MEDICATED_PATCH | CUTANEOUS | 0 refills | Status: DC
Start: 2021-10-07 — End: 2021-11-01

## 2021-10-07 MED ORDER — HYDROCODONE-ACETAMINOPHEN 5-325 MG PO TABS: 1.0000 | ORAL_TABLET | Freq: Four times a day (QID) | ORAL | 0 refills | Status: DC | PRN

## 2021-10-07 NOTE — Telephone Encounter (Signed)
Transition Care Management Follow-up Telephone Call ?Date of discharge and from where: [place] 10-05-2021 ?How have you been since you were released from the hospital?   Feeling sore  ?Any questions or concerns? Yes ?Wanting pain meds  patient has sent a mychart message to provider  ? ?Items Reviewed: ?Did the pt receive and understand the discharge instructions provided? Yes  ?Medications obtained and verified? Yes  ?Other? No  ?Any new allergies since your discharge? No  ?Dietary orders reviewed? No ?Do you have support at home? Yes  ? ?Home Care and Equipment/Supplies: ?Were home health services ordered? not applicable ?If so, what is the name of the agency?   ?Has the agency set up a time to come to the patient's home? not applicable ?Were any new equipment or medical supplies ordered?   ?What is the name of the medical supply agency?  ?Were you able to get the supplies/equipment? not applicable ?Do you have any questions related to the use of the equipment or supplies?  ? ?Functional Questionnaire: (I = Independent and D = Dependent) ?ADLs: I ? ?Bathing/Dressing- I/D ? ?Meal Prep- D ? ?Eating- I ? ?Maintaining continence- I ? ?Transferring/Ambulation- I ? ?Managing Meds- I ? ?Follow up appointments reviewed: ? ?PCP Hospital f/u appt confirmed? Yes  Scheduled to see [phone] on 9:00. ?[facility] f/u appt confirmed?  NO. ?Are transportation arrangements needed? No  ?If their condition worsens, is the pt aware to call PCP or go to the Emergency Dept.? Yes ?Was the patient provided with contact information for the PCP's office or ED? Yes ?Was to pt encouraged to call back with questions or concerns? Yes  ?

## 2021-10-07 NOTE — Addendum Note (Signed)
Addended by: Marnee Guarneri T on: 10/07/2021 05:24 PM ? ? Modules accepted: Orders ? ?

## 2021-10-08 ENCOUNTER — Telehealth: Payer: Self-pay

## 2021-10-08 NOTE — Telephone Encounter (Signed)
Noted  

## 2021-10-08 NOTE — Telephone Encounter (Signed)
Prior authorization was initiated via CoverMyMeds for prescription for Lidocaine 5% patches. Patient was approved via CoverMyMeds.  ? ?KEY: [id number] ?

## 2021-10-09 ENCOUNTER — Encounter (INDEPENDENT_AMBULATORY_CARE_PROVIDER_SITE_OTHER): Payer: PPO | Admitting: Ophthalmology

## 2021-10-11 ENCOUNTER — Ambulatory Visit (INDEPENDENT_AMBULATORY_CARE_PROVIDER_SITE_OTHER): Payer: PPO | Admitting: Nurse Practitioner

## 2021-10-11 ENCOUNTER — Telehealth: Payer: Self-pay

## 2021-10-11 ENCOUNTER — Encounter: Payer: Self-pay | Admitting: Nurse Practitioner

## 2021-10-11 VITALS — BP 124/73 | HR 76 | Temp 97.4°F | Ht 69.0 in | Wt 206.2 lb

## 2021-10-11 DIAGNOSIS — N179 Acute kidney failure, unspecified: Secondary | ICD-10-CM | POA: Diagnosis not present

## 2021-10-11 DIAGNOSIS — I152 Hypertension secondary to endocrine disorders: Secondary | ICD-10-CM | POA: Diagnosis not present

## 2021-10-11 DIAGNOSIS — E119 Type 2 diabetes mellitus without complications: Secondary | ICD-10-CM | POA: Diagnosis not present

## 2021-10-11 DIAGNOSIS — I4819 Other persistent atrial fibrillation: Secondary | ICD-10-CM | POA: Diagnosis not present

## 2021-10-11 DIAGNOSIS — E785 Hyperlipidemia, unspecified: Secondary | ICD-10-CM | POA: Diagnosis not present

## 2021-10-11 DIAGNOSIS — E1169 Type 2 diabetes mellitus with other specified complication: Secondary | ICD-10-CM

## 2021-10-11 DIAGNOSIS — D62 Acute posthemorrhagic anemia: Secondary | ICD-10-CM | POA: Diagnosis not present

## 2021-10-11 DIAGNOSIS — I9589 Other hypotension: Secondary | ICD-10-CM | POA: Diagnosis not present

## 2021-10-11 DIAGNOSIS — E1165 Type 2 diabetes mellitus with hyperglycemia: Secondary | ICD-10-CM | POA: Diagnosis not present

## 2021-10-11 DIAGNOSIS — E1159 Type 2 diabetes mellitus with other circulatory complications: Secondary | ICD-10-CM | POA: Diagnosis not present

## 2021-10-11 DIAGNOSIS — R531 Weakness: Secondary | ICD-10-CM | POA: Diagnosis not present

## 2021-10-11 DIAGNOSIS — I700 Atherosclerosis of aorta: Secondary | ICD-10-CM | POA: Diagnosis not present

## 2021-10-11 DIAGNOSIS — D649 Anemia, unspecified: Secondary | ICD-10-CM | POA: Diagnosis not present

## 2021-10-11 DIAGNOSIS — D696 Thrombocytopenia, unspecified: Secondary | ICD-10-CM

## 2021-10-11 NOTE — Telephone Encounter (Signed)
Received secure chat from Orion Crook, NP, Adline Peals, and Dr. Lovena Le today. See below. ?Dr. Quentin Ore saw the patient during recent hospitalization. At that time, he said he would see him in 3 months. Scheduled the patient with Dr. Quentin Ore 12/11/2021 in [place]. ?He was grateful for call and agrees with plan. ? ? ?

## 2021-10-11 NOTE — Assessment & Plan Note (Signed)
Maintain off Telmisartan at this time as BP at goal for age, may restart at lower dosing in future for kidney protection. ?

## 2021-10-11 NOTE — Progress Notes (Signed)
? ?BP 124/73   Pulse 76   Temp (!) 97.4 ?F (36.3 ?C) (Oral)   Ht '5\' 9"'  (1.753 m)   Wt 206 lb 3.2 oz (93.5 kg)   SpO2 100%   BMI 30.45 kg/m?   ? ?Subjective:  ? ? Patient ID: Andrew Cobb, male    DOB: 1951-05-07, 71 y.o.   MRN: [id number] ? ?HPI: ?Andrew Cobb is a 71 y.o. male ? ?Chief Complaint  ?Patient presents with  ? Fall  ? Hypotension  ? Bleeding/Bruising  ?  Patient states he is still bruised up from his fall. Patient wife says the bruising has now moved up patient's back.   ? Medication Management  ?  Patient wife states they informed patient to stop taking his blood pressure medication and Eliquis until patient was seen by his provider. Patient states they were told to discuss a different treatment option from Eliquis. Patient wife would like to discuss at today's visit.   ? ?Transition of Care Hospital Follow up.  ?Recent admission to hospital for fall -- they took him off Telmisartan and Eliquis until her saw PCP.  Due to recent fall he hurt blood vessel in upper left leg and had some blood pooling, but no fractures.  Fall took place on 10/02/21, lost balance  and felt off -- has only fallen two times this year.  No frequent falls.  At times he had no CP, SOB, N&V.  Currently continues to have pain to upper left ribs (no fractures were noted on imaging) -- 7/10, constant, throbbing and aching -- moving makes worse, using Lidocaine and Norco, icing every day, Tylenol.  Does not want to take Tramadol or stay on Norco, as in past used this too much.  At this time feels 50% better.  No fatigue, appetite is stable, dizziness, syncope.  Had been on ASA multiple years and was only put on Eliquis for A-fib a few months back. ? ?Returns to see cardiology in June.  Does not have in home PT at this time. A1c 8% in hospital recently.  CBC at discharge continued to show H/H 8.6/ 27.6, PLT 122, CRT 1.67, eGFR 44. ? ?"Hospital Course:  ?For full details, please see H&P, progress notes, consult notes and  ancillary notes.  ?Briefly, Andrew Cobb is a 71 y.o. male with medical history significant for paroxysmal A-fib on Eliquis, CAD s/p stent angioplasty, diabetes mellitus, history of CVA, sleep apnea who presented to the ER by EMS from his primary care provider's office. ?  ?Patient states that he fell 2 days prior to presenting to the hospital.  He was stepping out of his car and missed a step landing on his left side.  He has extensive bruising to his left lower extremity following the fall.  Since then he has had multiple falls at home and states that he gets dizzy and lightheaded prior to the falls and is very weak.  His wife took him to see his primary care provider and patient was unable to ambulate from the doorway into the room.  EMS was called and patient was transported to the ER. ?  ?Upon arrival to the ER he was hypotensive with blood pressure 78/57.  He received a 2 L fluid bolus with improvement in his blood pressure. ?Labs reveal a 4 g drop in his H&H.  3 months ago patient had a hemoglobin of 13.3 and on admission it was 9.3. ?  ?* Weakness generalized ?Orthostasis ?--appears to  be secondary to acute blood loss anemia following a fall. ?--Pt did well with PT eval and was cleared for discharge home, and pt felt ready to go home.  Pt was noted to have orthostasis from sitting to standing, however, asymptomatic and was able to walk.  Pt was advised to be aware and to use a walker for now to be safe and prevent further falls. ?  ?ABLA (acute blood loss anemia) 2/2 ?Left thigh hematoma ?Following a fall with extensive left thigh hematoma due to being on blood thinner. ?Patient noted to have a 4 g drop in his H&H.   ?--Hgb 8.6 prior to discharge.  Home eliquis held pending PCP followup. ?  ?Hypotension, resolved ?Unclear etiology but most likely secondary to acute blood loss anemia and concomitant antihypertensive medication use. ?--BP improved with IVF. ?--home telmisartan held pending PCP followup. ?   ?AKI (acute kidney injury) ([place]) ?Patient has a baseline serum creatinine of 1.27 and on admission it was 2.89 ?AKI appears to be secondary to ATN from hypotension related to acute blood loss anemia and concomitant ARB use. ?--Cr improved with IVF, was 1.67 prior to discharge. ?--home telmisartan held pending PCP followup. ?  ?Elevated troponin 2/2 demand ischemia ?--trop 900's, flat. ?--No chest pain.  Echo showed normal LVEF, no WMA, G1DD, no valvular abnormality. ?--cardiology consulted, noted Elevated troponin may be from demand ischemia in the setting of anemia, hypotension, AKI. ?  ?Paroxysmal atrial fibrillation ([place]) ?Patient is currently in sinus rhythm ?Home eliquis held pending PCP followup. ?  ?Diabetes mellitus without complication ([place]) ?--discharged back on home diabetic regimen as below ?  ?Hx of Seizures ([place]) ?Continue home Keppra" ?  ?Hospital/Facility: [facility] ?D/C Physician: Dr. Billie Ruddy ?D/C Date: 10/05/21 ? ?Records Requested: 10/11/21 ?Records Received: 10/11/21 ?Records Reviewed: 10/11/21 ? ?Diagnoses on Discharge: Fall with acute blood loss ? ?Date of interactive Contact within 48 hours of discharge:  ?Contact was through: phone ? ?Date of 7 day or 14 day face-to-face visit:    within 7 days ? ?Outpatient Encounter Medications as of 10/11/2021  ?Medication Sig  ? acyclovir (ZOVIRAX) 400 MG tablet Take 1 tablet (400 mg total) by mouth 2 (two) times daily.  ? albuterol (VENTOLIN HFA) 108 (90 Base) MCG/ACT inhaler Inhale 2 puffs into the lungs every 6 (six) hours as needed for wheezing or shortness of breath.  ? amitriptyline (ELAVIL) 25 MG tablet Take 1 tablet (25 mg total) by mouth at bedtime.  ? atorvastatin (LIPITOR) 40 MG tablet Take 1 tablet (40 mg total) by mouth at bedtime.  ? cetirizine (ZYRTEC) 10 MG tablet Take 10 mg by mouth daily.  ? Cholecalciferol (VITAMIN D) 2000 units CAPS Take 2,000 Units by mouth daily.  ? Cyanocobalamin (B-12) 5000 MCG CAPS Take 5,000 mcg by mouth every other day.  ?  empagliflozin (JARDIANCE) 10 MG TABS tablet Take 1 tablet (10 mg total) by mouth daily before breakfast.  ? fluticasone (FLONASE) 50 MCG/ACT nasal spray Place 2 sprays into both nostrils daily.  ? glucose blood (ONETOUCH ULTRA) test strip 1 each by Other route 2 (two) times daily. Use as instructed  ? HYDROcodone-acetaminophen (NORCO) 5-325 MG tablet Take 1 tablet by mouth every 6 (six) hours as needed for up to 5 days for moderate pain.  ? Lancets (ONETOUCH ULTRASOFT) lancets USE 1  TO CHECK GLUCOSE TWICE DAILY  ? levETIRAcetam (KEPPRA) 500 MG tablet Take 1 tablet (500 mg total) by mouth 2 (two) times daily.  ? lidocaine (LIDODERM) 5 %  Place 1 patch onto the skin daily. Remove & Discard patch within 12 hours or as directed by MD  ? metFORMIN (GLUCOPHAGE) 1000 MG tablet Take 1 tablet (1,000 mg total) by mouth 2 (two) times daily with a meal.  ? Multiple Vitamins-Minerals (MULTIVITAMIN PO) Take 1 tablet by mouth daily.   ? tamsulosin (FLOMAX) 0.4 MG CAPS capsule Take 1 capsule (0.4 mg total) by mouth at bedtime.  ? [DISCONTINUED] apixaban (ELIQUIS) 5 MG TABS tablet Hold until followup with PCP due to the hematoma in your thigh. (Patient not taking: Reported on 10/11/2021)  ? [DISCONTINUED] telmisartan (MICARDIS) 80 MG tablet Hold until followup with your PCP due to acute kidney injury and soft blood pressure. (Patient not taking: Reported on 10/11/2021)  ? ?No facility-administered encounter medications on file as of 10/11/2021.  ? ? ?Diagnostic Tests Reviewed/Disposition: reviewed within chart ? ?Consults: cardiology ? ?Discharge Instructions: Follow-up with PCP and cardiology ? ?Disease/illness Education: Reviewed with patient in office today ? ?Home Health/Community Services Discussions/Referrals: Will place PT referral ? ?Establishment or re-establishment of referral orders for community resources: None ? ?Discussion with other health care providers: reviewed within chart and communicated with cardiology via secure  chat -- to remain of Eliquis and they are looking into [facility] for patient ? ?Assessment and Support of treatment regimen adherence: Reviewed with patient ? ?Appointments Coordinated with: Reviewed with patient ? ?Ed

## 2021-10-11 NOTE — Assessment & Plan Note (Signed)
Continue off Eliquis at this time, still has significant bruising present to upper left leg.  Recheck CBC, Ferritin, and Iron level + check TSH today.  Consider GI or hematology referral if ongoing anemia present.  Improved symptoms present today. ?

## 2021-10-11 NOTE — Assessment & Plan Note (Signed)
Chronic, ongoing with A1c 8% recently in hospital, plus urine ALB 30 and A:C <30 recent check -- recheck next visit.  Currently off Telmisartan due to hypotension with anemia, may restart at lower dose in future. Neuro goal <6.5%, recommend he take medication daily and focus heavily on diet -- recheck today, as often at goal.  Continue  Metformin 1000 MG BID and continue Jardiance 10 MG - obtains via assistance.  May need to increase next visit if elevation above goal ongoing.  Recommend he check BS twice daily + continue focus on regular exercise and diet.  Return in 1 week. ?

## 2021-10-11 NOTE — Assessment & Plan Note (Addendum)
Recently due to hypotension, dehydration, and anemia.  Continue off Eliquis and Telmisartan at this time.  Recheck labs today.  Home PT referral placed. ?

## 2021-10-11 NOTE — Patient Instructions (Signed)
Anemia ?Anemia is a condition in which there is not enough red blood cells or hemoglobin in the blood. Hemoglobin is a substance in red blood cells that carries oxygen. ?When you do not have enough red blood cells or hemoglobin (are anemic), your body cannot get enough oxygen and your organs may not work properly. As a result, you may feel very tired or have other problems. ?What are the causes? ?Common causes of anemia include: ?Excessive bleeding. Anemia can be caused by excessive bleeding inside or outside the body, including bleeding from the intestines or from heavy menstrual periods in females. ?Poor nutrition. ?Long-lasting (chronic) kidney, thyroid, and liver disease. ?Bone marrow disorders, spleen problems, and blood disorders. ?Cancer and treatments for cancer. ?HIV (human immunodeficiency virus) and AIDS (acquired immunodeficiency syndrome). ?Infections, medicines, and autoimmune disorders that destroy red blood cells. ?What are the signs or symptoms? ?Symptoms of this condition include: ?Minor weakness. ?Dizziness. ?Headache, or difficulties concentrating and sleeping. ?Heartbeats that feel irregular or faster than normal (palpitations). ?Shortness of breath, especially with exercise. ?Pale skin, lips, and nails, or cold hands and feet. ?Indigestion and nausea. ?Symptoms may occur suddenly or develop slowly. If your anemia is mild, you may not have symptoms. ?How is this diagnosed? ?This condition is diagnosed based on blood tests, your medical history, and a physical exam. In some cases, a test may be needed in which cells are removed from the soft tissue inside of a bone and looked at under a microscope (bone marrow biopsy). Your health care provider may also check your stool (feces) for blood and may do additional testing to look for the cause of your bleeding. ?Other tests may include: ?Imaging tests, such as a CT scan or MRI. ?A procedure to see inside your esophagus and stomach (endoscopy). ?A  procedure to see inside your colon and rectum (colonoscopy). ?How is this treated? ?Treatment for this condition depends on the cause. If you continue to lose a lot of blood, you may need to be treated at a hospital. Treatment may include: ?Taking supplements of iron, vitamin E09, or folic acid. ?Taking a hormone medicine (erythropoietin) that can help to stimulate red blood cell growth. ?Having a blood transfusion. This may be needed if you lose a lot of blood. ?Making changes to your diet. ?Having surgery to remove your spleen. ?Follow these instructions at home: ?Take over-the-counter and prescription medicines only as told by your health care provider. ?Take supplements only as told by your health care provider. ?Follow any diet instructions that you were given by your health care provider. ?Keep all follow-up visits as told by your health care provider. This is important. ?Contact a health care provider if: ?You develop new bleeding anywhere in the body. ?Get help right away if: ?You are very weak. ?You are short of breath. ?You have pain in your abdomen or chest. ?You are dizzy or feel faint. ?You have trouble concentrating. ?You have bloody stools, black stools, or tarry stools. ?You vomit repeatedly or you vomit up blood. ?These symptoms may represent a serious problem that is an emergency. Do not wait to see if the symptoms will go away. Get medical help right away. Call your local emergency services (911 in the [place] Do not drive yourself to the hospital. ?Summary ?Anemia is a condition in which you do not have enough red blood cells or enough of a substance in your red blood cells that carries oxygen (hemoglobin). ?Symptoms may occur suddenly or develop slowly. ?If your anemia is  mild, you may not have symptoms. ?This condition is diagnosed with blood tests, a medical history, and a physical exam. Other tests may be needed. ?Treatment for this condition depends on the cause of the anemia. ?This  information is not intended to replace advice given to you by your health care provider. Make sure you discuss any questions you have with your health care provider. ?Document Revised: 05/31/2019 Document Reviewed: 05/31/2019 ?Elsevier Patient Education ? 2022 [employer] ? ?

## 2021-10-11 NOTE — Assessment & Plan Note (Signed)
Noted on past labs, check CBC today. 

## 2021-10-11 NOTE — Assessment & Plan Note (Addendum)
Chronic, ongoing with BP below neuro goal <130/90 without medication on board at this time, stopped in hospital April 2023.  Recommend he monitor BP at home daily and document.  Continue focus on DASH diet and regular exercise at home.  CBC and CMP today.  Recommend he wear compression hose at home to avoid syncopal episodes and orthostatic BP.   ?

## 2021-10-11 NOTE — Assessment & Plan Note (Signed)
Continue off Telmisartan at this time due to recent anemia and hypotension.  Recheck CMP today. ?

## 2021-10-11 NOTE — Assessment & Plan Note (Addendum)
Chronic, ongoing.  Continue tight lipid control, may need to increase Atorvastatin to 80 MG if ever elevation, recent LDL in 40's.  Continue current Atorvastatin dosing.  Lipid check today.  ?

## 2021-10-11 NOTE — Assessment & Plan Note (Signed)
Chronic, stable with cardioversion on 07/04/21.  Continue collaboration with cardiology.  Reached out to cardiology via secure chat to discuss anticoagulant.  At this time maintain off Eliquis or ASA due to fall risk and recent injuries. ?

## 2021-10-11 NOTE — Assessment & Plan Note (Signed)
Noted on imaging 07/11/20 and previous imaging.  Recommend continue daily statin therapy and ASA for prevention. 

## 2021-10-12 LAB — LIPID PANEL W/O CHOL/HDL RATIO
Cholesterol, Total: 97 mg/dL — ABNORMAL LOW (ref 100–199)
HDL: 53 mg/dL (ref >39)
LDL Chol Calc (NIH): 27 mg/dL (ref 0–99)
Triglycerides: 84 mg/dL (ref 0–149)
VLDL Cholesterol Cal: 17 mg/dL (ref 5–40)

## 2021-10-12 LAB — COMPREHENSIVE METABOLIC PANEL
ALT: 27 IU/L (ref 0–44)
AST: 29 IU/L (ref 0–40)
Albumin/Globulin Ratio: 2 (ref 1.2–2.2)
Albumin: 4.5 g/dL (ref 3.8–4.8)
Alkaline Phosphatase: 88 IU/L (ref 44–121)
BUN/Creatinine Ratio: 21 (ref 10–24)
BUN: 23 mg/dL (ref 8–27)
Bilirubin Total: 2.1 mg/dL — ABNORMAL HIGH (ref 0.0–1.2)
CO2: 21 mmol/L (ref 20–29)
Calcium: 8.8 mg/dL (ref 8.6–10.2)
Chloride: 103 mmol/L (ref 96–106)
Creatinine, Ser: 1.09 mg/dL (ref 0.76–1.27)
Globulin, Total: 2.2 g/dL (ref 1.5–4.5)
Glucose: 171 mg/dL — ABNORMAL HIGH (ref 70–99)
Potassium: 4.2 mmol/L (ref 3.5–5.2)
Sodium: 142 mmol/L (ref 134–144)
Total Protein: 6.7 g/dL (ref 6.0–8.5)
eGFR: 73 mL/min/{1.73_m2} (ref >59)

## 2021-10-12 LAB — CBC WITH DIFFERENTIAL/PLATELET
Basophils Absolute: 0 10*3/uL (ref 0.0–0.2)
Basos: 1 %
EOS (ABSOLUTE): 0.1 10*3/uL (ref 0.0–0.4)
Eos: 3 %
Hematocrit: 28.9 % — ABNORMAL LOW (ref 37.5–51.0)
Hemoglobin: 9.3 g/dL — ABNORMAL LOW (ref 13.0–17.7)
Immature Grans (Abs): 0.1 10*3/uL (ref 0.0–0.1)
Immature Granulocytes: 1 %
Lymphocytes Absolute: 0.8 10*3/uL (ref 0.7–3.1)
Lymphs: 16 %
MCH: 30.7 pg (ref 26.6–33.0)
MCHC: 32.2 g/dL (ref 31.5–35.7)
MCV: 95 fL (ref 79–97)
Monocytes Absolute: 0.4 10*3/uL (ref 0.1–0.9)
Monocytes: 9 %
Neutrophils Absolute: 3.4 10*3/uL (ref 1.4–7.0)
Neutrophils: 70 %
Platelets: 185 10*3/uL (ref 150–450)
RBC: 3.03 x10E6/uL — ABNORMAL LOW (ref 4.14–5.80)
RDW: 12.4 % (ref 11.6–15.4)
WBC: 4.8 10*3/uL (ref 3.4–10.8)

## 2021-10-12 LAB — IRON AND TIBC
Iron Saturation: 25 % (ref 15–55)
Iron: 89 ug/dL (ref 38–169)
Total Iron Binding Capacity: 353 ug/dL (ref 250–450)
UIBC: 264 ug/dL (ref 111–343)

## 2021-10-12 LAB — HEMOGLOBIN A1C
Est. average glucose Bld gHb Est-mCnc: 177 mg/dL
Hgb A1c MFr Bld: 7.8 % — ABNORMAL HIGH (ref 4.8–5.6)

## 2021-10-12 LAB — FERRITIN: Ferritin: 215 ng/mL (ref 30–400)

## 2021-10-12 LAB — TSH: TSH: 1.49 u[IU]/mL (ref 0.450–4.500)

## 2021-10-12 NOTE — Patient Instructions (Signed)

## 2021-10-12 NOTE — Progress Notes (Signed)
Contacted via [place] -- can we work on changing assistance on Jardiance to 25 MG dosing please, as A1c has trended upwards to above goal. ? ? ?Good morning Andrew Cobb, your labs have returned: ?- A1c is elevated from previous of 6.9% -- hospital has you at 8% and our lab in office has you at 7.8%.  I do recommend we tweak medications a little, I have reached out to [place] to see if we can increase your Jardiance to 25 MG daily via assistance program.  Monitor diet intake of sweets and foods high in carbohydrates. ?- Kidney function, creatinine and eGFR, remains normal, as is liver function, AST and ALT.   ?- Cholesterol levels remain at stroke prevention goal.  Thyroid normal on TSH. ?- Hemoglobin and hematocrit continue to show low levels, but are trending up.  Please ensure you continue to rest as you are not fully healed yet and we will recheck next visit. Eat lots of green, leafy vegetables. ?- Iron and ferritin levels stable.  Any questions? ?Keep being amazing!!  Thank you for allowing me to participate in your care.  I appreciate you. ?Kindest regards, ?Charman Blasco ?

## 2021-10-14 DIAGNOSIS — M79604 Pain in right leg: Secondary | ICD-10-CM | POA: Diagnosis not present

## 2021-10-14 DIAGNOSIS — M25551 Pain in right hip: Secondary | ICD-10-CM | POA: Diagnosis not present

## 2021-10-17 ENCOUNTER — Telehealth: Payer: Self-pay

## 2021-10-17 NOTE — Chronic Care Management (AMB) (Signed)
? ? ?  Chronic Care Management ?Pharmacy Assistant  ? ?Name: Andrew Cobb  MRN: [id number] DOB: 1950-08-15 ? ?Reason for Encounter: Patient Assistance application for Jardiance 25 mg increased dosage ? ? ?Medications: ?Outpatient Encounter Medications as of 10/17/2021  ?Medication Sig  ? acyclovir (ZOVIRAX) 400 MG tablet Take 1 tablet (400 mg total) by mouth 2 (two) times daily.  ? albuterol (VENTOLIN HFA) 108 (90 Base) MCG/ACT inhaler Inhale 2 puffs into the lungs every 6 (six) hours as needed for wheezing or shortness of breath.  ? amitriptyline (ELAVIL) 25 MG tablet Take 1 tablet (25 mg total) by mouth at bedtime.  ? atorvastatin (LIPITOR) 40 MG tablet Take 1 tablet (40 mg total) by mouth at bedtime.  ? cetirizine (ZYRTEC) 10 MG tablet Take 10 mg by mouth daily.  ? Cholecalciferol (VITAMIN D) 2000 units CAPS Take 2,000 Units by mouth daily.  ? Cyanocobalamin (B-12) 5000 MCG CAPS Take 5,000 mcg by mouth every other day.  ? empagliflozin (JARDIANCE) 10 MG TABS tablet Take 1 tablet (10 mg total) by mouth daily before breakfast.  ? fluticasone (FLONASE) 50 MCG/ACT nasal spray Place 2 sprays into both nostrils daily.  ? glucose blood (ONETOUCH ULTRA) test strip 1 each by Other route 2 (two) times daily. Use as instructed  ? Lancets (ONETOUCH ULTRASOFT) lancets USE 1  TO CHECK GLUCOSE TWICE DAILY  ? levETIRAcetam (KEPPRA) 500 MG tablet Take 1 tablet (500 mg total) by mouth 2 (two) times daily.  ? lidocaine (LIDODERM) 5 % Place 1 patch onto the skin daily. Remove & Discard patch within 12 hours or as directed by MD  ? metFORMIN (GLUCOPHAGE) 1000 MG tablet Take 1 tablet (1,000 mg total) by mouth 2 (two) times daily with a meal.  ? Multiple Vitamins-Minerals (MULTIVITAMIN PO) Take 1 tablet by mouth daily.   ? tamsulosin (FLOMAX) 0.4 MG CAPS capsule Take 1 capsule (0.4 mg total) by mouth at bedtime.  ? ?No facility-administered encounter medications on file as of 10/17/2021.  ? ?I have prefilled and mailed out patient  assistance application for Jardiance 25 mg. I have spoke with the the patient and patients wife on what needs to be completed which is the highlighted areas sign and bring back to the office once completed. Patient and patients wife understood. ? ? ?Corrie Mckusick, RMA ?Health Concierge ? ?

## 2021-10-18 ENCOUNTER — Encounter: Payer: Self-pay | Admitting: Nurse Practitioner

## 2021-10-18 ENCOUNTER — Ambulatory Visit
Admission: RE | Admit: 2021-10-18 | Discharge: 2021-10-18 | Disposition: A | Discharge status: Home / Self Care | Payer: PPO | Source: Ambulatory Visit | Attending: Nurse Practitioner | Admitting: Nurse Practitioner

## 2021-10-18 ENCOUNTER — Ambulatory Visit: Payer: Self-pay

## 2021-10-18 ENCOUNTER — Ambulatory Visit (INDEPENDENT_AMBULATORY_CARE_PROVIDER_SITE_OTHER): Payer: PPO | Admitting: Nurse Practitioner

## 2021-10-18 ENCOUNTER — Ambulatory Visit
Admission: RE | Admit: 2021-10-18 | Discharge: 2021-10-18 | Disposition: A | Discharge status: Home / Self Care | Payer: PPO | Attending: Nurse Practitioner | Admitting: Nurse Practitioner

## 2021-10-18 ENCOUNTER — Other Ambulatory Visit: Payer: Self-pay

## 2021-10-18 VITALS — BP 138/70 | HR 65 | Temp 97.4°F | Ht 69.0 in | Wt 203.2 lb

## 2021-10-18 DIAGNOSIS — E1159 Type 2 diabetes mellitus with other circulatory complications: Secondary | ICD-10-CM | POA: Diagnosis not present

## 2021-10-18 DIAGNOSIS — I9589 Other hypotension: Secondary | ICD-10-CM | POA: Diagnosis not present

## 2021-10-18 DIAGNOSIS — M79662 Pain in left lower leg: Secondary | ICD-10-CM | POA: Diagnosis not present

## 2021-10-18 DIAGNOSIS — M25552 Pain in left hip: Secondary | ICD-10-CM | POA: Insufficient documentation

## 2021-10-18 DIAGNOSIS — D62 Acute posthemorrhagic anemia: Secondary | ICD-10-CM | POA: Diagnosis not present

## 2021-10-18 DIAGNOSIS — M79672 Pain in left foot: Secondary | ICD-10-CM | POA: Diagnosis not present

## 2021-10-18 DIAGNOSIS — I152 Hypertension secondary to endocrine disorders: Secondary | ICD-10-CM

## 2021-10-18 DIAGNOSIS — E1165 Type 2 diabetes mellitus with hyperglycemia: Secondary | ICD-10-CM | POA: Diagnosis not present

## 2021-10-18 DIAGNOSIS — R600 Localized edema: Secondary | ICD-10-CM | POA: Diagnosis not present

## 2021-10-18 DIAGNOSIS — Z96652 Presence of left artificial knee joint: Secondary | ICD-10-CM | POA: Diagnosis not present

## 2021-10-18 NOTE — Assessment & Plan Note (Signed)
Ongoing since fall with large fluid filled hematoma. Will get imaging today.  Referral to ortho placed.  Avoid any exercising and stay off work for now. ?

## 2021-10-18 NOTE — Progress Notes (Signed)
? ?BP 138/70 (BP Location: Left Arm, Patient Position: Sitting, Cuff Size: Normal)   Pulse 65   Temp (!) 97.4 ?F (36.3 ?C) (Oral)   Ht '5\' 9"'  (1.753 m)   Wt 203 lb 3.2 oz (92.2 kg)   SpO2 97%   BMI 30.01 kg/m?   ? ?Subjective:  ? ? Patient ID: Andrew Cobb, male    DOB: 1951/01/18, 71 y.o.   MRN: [id number] ? ?HPI: ?Andrew Cobb is a 71 y.o. male ? ?Chief Complaint  ?Patient presents with  ? Diabetes  ?  Patient is here to follow up on Diabetes.   ? Hip Problem  ?  Patient states he is still having soreness and stiffness in his left hip.   ? Foot Problem  ?  Patient would like to have the provider looks at his left foot. Patient is still having bruising on his hip down into his foot. Patient states he is still experiencing swelling his left foot.   ? ?DIABETES ?Recent A1c 7.8%, upward trend for him.  We ware working on getting assistance for Jardiance 25 MG, currently on 10 MG.  Continues on Metformin 1000 MG BID. ?Hypoglycemic episodes:no ?Polydipsia/polyuria: no ?Visual disturbance: no ?Chest pain: no ?Paresthesias: no ?Glucose Monitoring: yes ? Accucheck frequency: Daily ? Fasting glucose: 130-140 ? Post prandial: ? Evening: ? Before meals: ?Taking Insulin?: no ? Long acting insulin: ? Short acting insulin: ?Blood Pressure Monitoring: daily ?Retinal Examination: Up to Date ?Foot Exam: Up to Date ?Pneumovax: Up to Date ?Influenza: Up to Date ?Aspirin: no  ? ?HIP PAIN ?Continues to have pain to left hip since fall, continues to have hematoma anterior/lateral aspect.  Placing ice to area and using Voltaren.  Also noted to be having pain left foot, top aspect, has bruising to area.   ?Duration: chronic ?Involved hip: left  ?Mechanism of injury: unknown ?Location: anterior ?Onset: gradual  ?Severity: 5/10  ?Quality: sharp, dull, aching, and throbbing ?Frequency: constant ?Radiation: no ?Aggravating factors: walking, movement, and prolonged sitting   ?Alleviating factors: ice and APAP  ?Status:  fluctuating ?Treatments attempted: rest, ice, and APAP   ?Relief with NSAIDs?: No NSAIDs Taken ?Weakness with weight bearing: no ?Weakness with walking: no ?Paresthesias / decreased sensation: no ?Swelling: yes ?Redness:no ?Fevers: no  ? ?Relevant past medical, surgical, family and social history reviewed and updated as indicated. Interim medical history since our last visit reviewed. ?Allergies and medications reviewed and updated. ? ?Review of Systems  ?Constitutional:  Negative for activity change, diaphoresis, fatigue and fever.  ?Respiratory:  Negative for cough, chest tightness, shortness of breath and wheezing.   ?Cardiovascular:  Negative for chest pain, palpitations and leg swelling.  ?Gastrointestinal: Negative.   ?Musculoskeletal:  Positive for arthralgias.  ?Neurological: Negative.   ?Psychiatric/Behavioral: Negative.    ? ?Per HPI unless specifically indicated above ? ?   ?Objective:  ?  ?BP 138/70 (BP Location: Left Arm, Patient Position: Sitting, Cuff Size: Normal)   Pulse 65   Temp (!) 97.4 ?F (36.3 ?C) (Oral)   Ht '5\' 9"'  (1.753 m)   Wt 203 lb 3.2 oz (92.2 kg)   SpO2 97%   BMI 30.01 kg/m?   ?Wt Readings from Last 3 Encounters:  ?10/18/21 203 lb 3.2 oz (92.2 kg)  ?10/11/21 206 lb 3.2 oz (93.5 kg)  ?10/04/21 208 lb 1.8 oz (94.4 kg)  ?  ?Physical Exam ?Vitals and nursing note reviewed.  ?Constitutional:   ?   General: He is awake. He is  not in acute distress. ?   Appearance: He is well-developed and well-groomed. He is not ill-appearing.  ?HENT:  ?   Head: Normocephalic and atraumatic.  ?   Right Ear: Hearing normal. No drainage.  ?   Left Ear: Hearing normal. No drainage.  ?Eyes:  ?   General: Lids are normal.     ?   Right eye: No discharge.     ?   Left eye: No discharge.  ?   Conjunctiva/sclera: Conjunctivae normal.  ?   Pupils: Pupils are equal, round, and reactive to light.  ?Neck:  ?   Thyroid: No thyromegaly.  ?   Vascular: No carotid bruit or JVD.  ?   Trachea: Trachea normal.   ?Cardiovascular:  ?   Rate and Rhythm: Normal rate and regular rhythm.  ?   Pulses:     ?     Dorsalis pedis pulses are 2+ on the right side and 2+ on the left side.  ?     Posterior tibial pulses are 2+ on the right side and 2+ on the left side.  ?   Heart sounds: Normal heart sounds, S1 normal and S2 normal. No murmur heard. ?  No gallop.  ?Pulmonary:  ?   Effort: Pulmonary effort is normal. No accessory muscle usage or respiratory distress.  ?   Breath sounds: Normal breath sounds.  ?Abdominal:  ?   General: Bowel sounds are normal. There is no distension.  ?   Palpations: Abdomen is soft.  ?   Tenderness: There is no abdominal tenderness.  ?Musculoskeletal:     ?   General: Normal range of motion.  ?   Cervical back: Normal range of motion and neck supple.  ?   Right hip: Normal.  ?   Left hip: Tenderness (anterior) present. No bony tenderness or crepitus. Normal range of motion. Normal strength.  ?   Right lower leg: No edema.  ?   Left lower leg: No edema.  ?   Right foot: Normal range of motion.  ?   Left foot: Normal range of motion.  ?     Feet: ? ?   Comments: Moderate size, firm fluid filled hematoma to anterior aspect of left hip with moderate bruising.   ?Feet:  ?   Right foot:  ?   Protective Sensation: 10 sites tested.  10 sites sensed.  ?   Toenail Condition: Right toenails are normal.  ?   Left foot:  ?   Protective Sensation: 10 sites tested.  10 sites sensed.  ?   Toenail Condition: Left toenails are normal.  ?Skin: ?   General: Skin is warm and dry.  ?   Capillary Refill: Capillary refill takes less than 2 seconds.  ?   Comments: Scattered pale purple bruising upper extremities.  To left upper leg, posterior, significant bruising with edema and tenderness -- bruising extending to back just below rib line.  Purple and yellow in color becoming paler in color.  ?Neurological:  ?   Mental Status: He is alert and oriented to person, place, and time.  ?   Cranial Nerves: Cranial nerves 2-12 are  intact.  ?   Motor: Motor function is intact.  ?   Coordination: Coordination is intact.  ?   Gait: Gait is intact.  ?   Deep Tendon Reflexes: Reflexes are normal and symmetric.  ?Psychiatric:     ?   Attention and Perception: Attention normal.     ?  Mood and Affect: Mood normal.     ?   Speech: Speech normal.     ?   Behavior: Behavior normal. Behavior is cooperative.     ?   Thought Content: Thought content normal.  ? ? ?Results for orders placed or performed in visit on 10/11/21  ?HgB A1c  ?Result Value Ref Range  ? Hgb A1c MFr Bld 7.8 (H) 4.8 - 5.6 %  ? Est. average glucose Bld gHb Est-mCnc 177 mg/dL  ?CBC with Differential/Platelet  ?Result Value Ref Range  ? WBC 4.8 3.4 - 10.8 x10E3/uL  ? RBC 3.03 (L) 4.14 - 5.80 x10E6/uL  ? Hemoglobin 9.3 (L) 13.0 - 17.7 g/dL  ? Hematocrit 28.9 (L) 37.5 - 51.0 %  ? MCV 95 79 - 97 fL  ? MCH 30.7 26.6 - 33.0 pg  ? MCHC 32.2 31.5 - 35.7 g/dL  ? RDW 12.4 11.6 - 15.4 %  ? Platelets 185 150 - 450 x10E3/uL  ? Neutrophils 70 Not Estab. %  ? Lymphs 16 Not Estab. %  ? Monocytes 9 Not Estab. %  ? Eos 3 Not Estab. %  ? Basos 1 Not Estab. %  ? Neutrophils Absolute 3.4 1.4 - 7.0 x10E3/uL  ? Lymphocytes Absolute 0.8 0.7 - 3.1 x10E3/uL  ? Monocytes Absolute 0.4 0.1 - 0.9 x10E3/uL  ? EOS (ABSOLUTE) 0.1 0.0 - 0.4 x10E3/uL  ? Basophils Absolute 0.0 0.0 - 0.2 x10E3/uL  ? Immature Granulocytes 1 Not Estab. %  ? Immature Grans (Abs) 0.1 0.0 - 0.1 x10E3/uL  ?Comprehensive metabolic panel  ?Result Value Ref Range  ? Glucose 171 (H) 70 - 99 mg/dL  ? BUN 23 8 - 27 mg/dL  ? Creatinine, Ser 1.09 0.76 - 1.27 mg/dL  ? eGFR 73 >59 mL/min/1.73  ? BUN/Creatinine Ratio 21 10 - 24  ? Sodium 142 134 - 144 mmol/L  ? Potassium 4.2 3.5 - 5.2 mmol/L  ? Chloride 103 96 - 106 mmol/L  ? CO2 21 20 - 29 mmol/L  ? Calcium 8.8 8.6 - 10.2 mg/dL  ? Total Protein 6.7 6.0 - 8.5 g/dL  ? Albumin 4.5 3.8 - 4.8 g/dL  ? Globulin, Total 2.2 1.5 - 4.5 g/dL  ? Albumin/Globulin Ratio 2.0 1.2 - 2.2  ? Bilirubin Total 2.1 (H) 0.0 -  1.2 mg/dL  ? Alkaline Phosphatase 88 44 - 121 IU/L  ? AST 29 0 - 40 IU/L  ? ALT 27 0 - 44 IU/L  ?TSH  ?Result Value Ref Range  ? TSH 1.490 0.450 - 4.500 uIU/mL  ?Lipid Panel w/o Chol/HDL Ratio  ?Result Value Ref Range  ? Cholesterol, Total 97 (

## 2021-10-18 NOTE — Assessment & Plan Note (Signed)
Chronic, ongoing with A1c 7.8% recently, plus urine ALB 30 and A:C <30.  Currently off Telmisartan due to hypotension with anemia, may restart at lower dose in future. Neuro goal <6.5%, recommend he take medication daily and focus heavily on diet -- recheck today, as often at goal.  Continue  Metformin 1000 MG BID and working with CCM on increasing Jardiance to 25 MG - obtains via assistance.  May need to increase next visit if elevation above goal ongoing.  Recommend he check BS twice daily + continue focus on regular exercise and diet.  Return in 1 week. ?

## 2021-10-18 NOTE — Assessment & Plan Note (Addendum)
Chronic, ongoing with BP below neuro goal <130/90 without medication on board at this time, stopped in hospital April 2023.  Recommend he monitor BP at home daily and document.  Continue focus on DASH diet and regular exercise at home.  CBC today.  Recommend he wear compression hose at home to avoid syncopal episodes and orthostatic BP.   ?

## 2021-10-18 NOTE — Assessment & Plan Note (Signed)
Ongoing since fall, concern for fracture to lower tibia. Will get imaging today.  Referral to ortho placed.  Avoid any exercising and stay off work for now. ?

## 2021-10-18 NOTE — Assessment & Plan Note (Signed)
Continue off Eliquis and ASA at this time, still has significant bruising present to upper left leg and cardiology has recommend maintaining off.  Recheck CBC today.  Consider GI or hematology referral if ongoing anemia present.  Improved symptoms present today. ?

## 2021-10-18 NOTE — Assessment & Plan Note (Signed)
BP is stabilizing and trending back up, in 10 day will follow-up and may restart medication at that time. ?

## 2021-10-18 NOTE — Telephone Encounter (Signed)
Pt's wife called, advised her per Henrine Screws, NP notes from [place] today, pt is to f/up in 10 days and monitor BP at home daily and watch diet as well as exercise. Pt appt scheduled for 11/01/21 at 0920 to f/up with Jolene for possibly restarting BP meds if BP still trending up. Pt wife verbalized understanding.  ?

## 2021-10-19 LAB — CBC WITH DIFFERENTIAL/PLATELET
Basophils Absolute: 0 10*3/uL (ref 0.0–0.2)
Basos: 0 %
EOS (ABSOLUTE): 0.1 10*3/uL (ref 0.0–0.4)
Eos: 2 %
Hematocrit: 32.8 % — ABNORMAL LOW (ref 37.5–51.0)
Hemoglobin: 10.9 g/dL — ABNORMAL LOW (ref 13.0–17.7)
Immature Grans (Abs): 0 10*3/uL (ref 0.0–0.1)
Immature Granulocytes: 1 %
Lymphocytes Absolute: 0.7 10*3/uL (ref 0.7–3.1)
Lymphs: 10 %
MCH: 31.6 pg (ref 26.6–33.0)
MCHC: 33.2 g/dL (ref 31.5–35.7)
MCV: 95 fL (ref 79–97)
Monocytes Absolute: 0.5 10*3/uL (ref 0.1–0.9)
Monocytes: 7 %
Neutrophils Absolute: 5.2 10*3/uL (ref 1.4–7.0)
Neutrophils: 80 %
Platelets: 210 10*3/uL (ref 150–450)
RBC: 3.45 x10E6/uL — ABNORMAL LOW (ref 4.14–5.80)
RDW: 13.1 % (ref 11.6–15.4)
WBC: 6.5 10*3/uL (ref 3.4–10.8)

## 2021-10-19 NOTE — Progress Notes (Signed)
Contacted via [place] ? ? ?Good news for the day, your anemia continues to trend up.  Hemoglobin and hematocrit.  Is improving still.  I will review imaging results once receive and ensure to schedule with ortho.  Will recheck labs next visit.  Any questions? ?Keep being inspiring!!  Thank you for allowing me to participate in your care.  I appreciate you. ?Kindest regards, ?Talma Aguillard ?

## 2021-10-20 NOTE — Progress Notes (Signed)
Contacted via [place] ? ? ?Good evening Andrew Cobb, your imaging has returned and the good news is no fractures are seen to any of the areas.  I do recommend you scheduled with ortho when they call though as there is soft tissue swelling noted, as we know, in front aspect of your left hip.  I would like them to further look at this.  They may recommend some physical therapy as well, we will see.  Any questions? ?Keep being awesome!!  Thank you for allowing me to participate in your care.  I appreciate you. ?Kindest regards, ?Andrew Cobb ?

## 2021-10-21 ENCOUNTER — Ambulatory Visit (INDEPENDENT_AMBULATORY_CARE_PROVIDER_SITE_OTHER): Payer: PPO

## 2021-10-21 ENCOUNTER — Telehealth: Payer: Self-pay | Admitting: Nurse Practitioner

## 2021-10-21 DIAGNOSIS — R55 Syncope and collapse: Secondary | ICD-10-CM

## 2021-10-21 NOTE — Telephone Encounter (Signed)
Patient dropped off a  completed Copy application to be completed  and sent in by the provider. Patient also stated that he corrected the city in the address. Application was placed in the provider folder for completion. ?

## 2021-10-21 NOTE — Telephone Encounter (Signed)
Noted will complete upon return. ?

## 2021-10-22 DIAGNOSIS — S7002XA Contusion of left hip, initial encounter: Secondary | ICD-10-CM | POA: Diagnosis not present

## 2021-10-23 ENCOUNTER — Ambulatory Visit (INDEPENDENT_AMBULATORY_CARE_PROVIDER_SITE_OTHER): Payer: PPO | Admitting: Ophthalmology

## 2021-10-23 ENCOUNTER — Encounter (INDEPENDENT_AMBULATORY_CARE_PROVIDER_SITE_OTHER): Payer: Self-pay | Admitting: Ophthalmology

## 2021-10-23 ENCOUNTER — Encounter (INDEPENDENT_AMBULATORY_CARE_PROVIDER_SITE_OTHER): Payer: PPO | Admitting: Ophthalmology

## 2021-10-23 DIAGNOSIS — H35721 Serous detachment of retinal pigment epithelium, right eye: Secondary | ICD-10-CM

## 2021-10-23 DIAGNOSIS — H353112 Nonexudative age-related macular degeneration, right eye, intermediate dry stage: Secondary | ICD-10-CM

## 2021-10-23 DIAGNOSIS — E1169 Type 2 diabetes mellitus with other specified complication: Secondary | ICD-10-CM | POA: Diagnosis not present

## 2021-10-23 DIAGNOSIS — H353211 Exudative age-related macular degeneration, right eye, with active choroidal neovascularization: Secondary | ICD-10-CM | POA: Diagnosis not present

## 2021-10-23 DIAGNOSIS — E785 Hyperlipidemia, unspecified: Secondary | ICD-10-CM | POA: Diagnosis not present

## 2021-10-23 LAB — CUP PACEART REMOTE DEVICE CHECK
Date Time Interrogation Session: 20230414231243
Implantable Pulse Generator Implant Date: 20200316

## 2021-10-23 MED ORDER — AFLIBERCEPT 2MG/0.05ML IZ SOLN FOR KALEIDOSCOPE
2.0000 mg | INTRAVITREAL | Status: AC | PRN
  Administered 2021-10-23: 2 mg via INTRAVITREAL

## 2021-10-23 NOTE — Assessment & Plan Note (Signed)
No detectable diabetic retinopathy 

## 2021-10-23 NOTE — Assessment & Plan Note (Signed)
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  Still with persistence of subretinal fluid although preserved acuity. ? ?Repeat Eylea today currently at 8-week interval.  Follow-up next in 6 weeks and change to Vabysmo ?

## 2021-10-23 NOTE — Progress Notes (Signed)
? ? ?10/23/2021 ? ?  ? ?CHIEF COMPLAINT ?Patient presents for  ?Chief Complaint  ?Patient presents with  ? Macular Degeneration  ? ? ? ? ?HISTORY OF PRESENT ILLNESS: ?Andrew Cobb is a 71 y.o. male who presents to the clinic today for:  ? ?HPI   ?8 weeks for DILATE, OD, EYLEA OCT. ?Pt denies floaters and FOL. ?Pt states no changes in vision. ? ? ?Last edited by Silvestre Moment on 10/23/2021  9:32 AM.  ?  ? ? ?Referring physician: ?[name], NP ?[address] [place],  [place] [address] ? ?HISTORICAL INFORMATION:  ? ?Selected notes from the [facility] ?  ? ?Lab Results  ?Component Value Date  ? HGBA1C 7.8 (H) 10/11/2021  ?  ? ?CURRENT MEDICATIONS: ?No current outpatient medications on file. (Ophthalmic Drugs)  ? ?No current facility-administered medications for this visit. (Ophthalmic Drugs)  ? ?Current Outpatient Medications (Other)  ?Medication Sig  ? acyclovir (ZOVIRAX) 400 MG tablet Take 1 tablet (400 mg total) by mouth 2 (two) times daily.  ? albuterol (VENTOLIN HFA) 108 (90 Base) MCG/ACT inhaler Inhale 2 puffs into the lungs every 6 (six) hours as needed for wheezing or shortness of breath.  ? amitriptyline (ELAVIL) 25 MG tablet Take 1 tablet (25 mg total) by mouth at bedtime.  ? atorvastatin (LIPITOR) 40 MG tablet Take 1 tablet (40 mg total) by mouth at bedtime.  ? cetirizine (ZYRTEC) 10 MG tablet Take 10 mg by mouth daily.  ? Cholecalciferol (VITAMIN D) 2000 units CAPS Take 2,000 Units by mouth daily.  ? Cyanocobalamin (B-12) 5000 MCG CAPS Take 5,000 mcg by mouth every other day.  ? empagliflozin (JARDIANCE) 10 MG TABS tablet Take 1 tablet (10 mg total) by mouth daily before breakfast.  ? fluticasone (FLONASE) 50 MCG/ACT nasal spray Place 2 sprays into both nostrils daily.  ? glucose blood (ONETOUCH ULTRA) test strip 1 each by Other route 2 (two) times daily. Use as instructed  ? Lancets (ONETOUCH ULTRASOFT) lancets USE 1  TO CHECK GLUCOSE TWICE DAILY  ? levETIRAcetam (KEPPRA) 500 MG tablet Take 1 tablet  (500 mg total) by mouth 2 (two) times daily.  ? lidocaine (LIDODERM) 5 % Place 1 patch onto the skin daily. Remove & Discard patch within 12 hours or as directed by MD  ? metFORMIN (GLUCOPHAGE) 1000 MG tablet Take 1 tablet (1,000 mg total) by mouth 2 (two) times daily with a meal.  ? Multiple Vitamins-Minerals (MULTIVITAMIN PO) Take 1 tablet by mouth daily.   ? tamsulosin (FLOMAX) 0.4 MG CAPS capsule Take 1 capsule (0.4 mg total) by mouth at bedtime.  ? ?No current facility-administered medications for this visit. (Other)  ? ? ? ? ?REVIEW OF SYSTEMS: ?ROS   ?Negative for: Constitutional, Gastrointestinal, Neurological, Skin, Genitourinary, Musculoskeletal, HENT, Endocrine, Cardiovascular, Eyes, Respiratory, Psychiatric, Allergic/Imm, Heme/Lymph ?Last edited by Silvestre Moment on 10/23/2021  9:32 AM.  ?  ? ? ? ?ALLERGIES ?Allergies  ?Allergen Reactions  ? Succinylsulphathiazole Rash  ? Sulfamethoxazole-Trimethoprim Rash  ? Tetracyclines & Related Rash  ? ? ?PAST MEDICAL HISTORY ?Past Medical History:  ?Diagnosis Date  ? Arthritis   ? Asthma   ? has not needed since 6/21  ? Cancer [facility])   ? prostate  ? Chronic venous insufficiency   ? varicose vein lower extremity with inflammation  ? Coronary artery disease 1996  ? two stents placed   ? Diabetes mellitus without complication ([place])   ? type 2 on metformin  ?  GERD (gastroesophageal reflux disease)   ? no issues since gastric bypass surgery as stated per pt  ? Hyperlipidemia   ? Hypertension   ? Hypogonadism in male   ? MRSA (methicillin resistant Staphylococcus aureus) infection   ? 07/30/2008 thru 08/07/2008  abdominal abcess  ? Sleep apnea   ? on BIPAP  ? Stented coronary artery   ? Stroke [facility]) 09/07/2019  ? no defecits  ? Thrombocythemia   ? ?Past Surgical History:  ?Procedure Laterality Date  ? ANGIOPLASTY    ? ANGIOPLASTY    ? with stent 04/07/1995  ? ANTERIOR CERVICAL DECOMPRESSION/DISCECTOMY FUSION 4 LEVELS N/A 08/17/2017  ? Procedure: Anterior discectomy with fusion and  plate fixation Cervical Three-Four, Four-Five, Five-Six, and Six-Seven Fusion;  Surgeon: Ditty, Kevan Ny, MD;  Location: [place];  Service: Neurosurgery;  Laterality: N/A;  Anterior discectomy with fusion and plate fixation ?Cervical Three-Four, Four-Five, Five-Six, and Six-Seven Fusion   ? APPENDECTOMY    ? 1966  ? BUBBLE STUDY  09/20/2018  ? Procedure: BUBBLE STUDY;  Surgeon: Josue Hector, MD;  Location: [place];  Service: Cardiovascular;;  ? [place]  ? CARDIOVASCULAR STRESS TEST    ? 07/31/2011  ? CARDIOVERSION N/A 07/04/2021  ? Procedure: CARDIOVERSION;  Surgeon: [name], MD;  Location: [facility] ENDOSCOPY;  Service: Cardiovascular;  Laterality: N/A;  ? CARPAL TUNNEL RELEASE Left   ? CHOLECYSTECTOMY    ? 2006  ? COLONOSCOPY WITH PROPOFOL N/A 03/26/2021  ? Procedure: COLONOSCOPY WITH PROPOFOL;  Surgeon: Jonathon Bellows, MD;  Location: [facility] ENDOSCOPY;  Service: Gastroenterology;  Laterality: N/A;  ? colonscopy     ? 08/25/2012  ? EYE SURGERY Bilateral   ? cataract  ? FUNCTIONAL ENDOSCOPIC SINUS SURGERY    ? 11/10/2013  ? GASTRIC BYPASS    ? 10/05/2012  ? HERNIA REPAIR    ? left inguinal 1981  ? INCISION AND DRAINAGE ABSCESS Right 02/26/2017  ? Procedure: INCISION AND DRAINAGE ABSCESS;  Surgeon: Nickie Retort, MD;  Location: [facility] ORS;  Service: Urology;  Laterality: Right;  ? JOINT REPLACEMENT    ? bilateral  ? left ankle surgery     ? 05/03/2003   ? left carpel tunnel     ? 09/18/1993  ? left knee meniscal tear     ? 01/25/2010  ? left knee meniscal tear repair     ? 05/04/1996  ? left rotator cuff repair     ? 05/03/2003   ? LOOP RECORDER INSERTION N/A 09/20/2018  ? Procedure: LOOP RECORDER INSERTION;  Surgeon: Evans Lance, MD;  Location: [place] CV LAB;  Service: Cardiovascular;  Laterality: N/A;  ? REPLACEMENT TOTAL KNEE BILATERAL  07/13/2015  ? REVERSE SHOULDER ARTHROPLASTY Left 08/30/2020  ? Procedure: REVERSE SHOULDER ARTHROPLASTY;  Surgeon: Justice Britain, MD;  Location: [facility]  ORS;  Service: Orthopedics;  Laterality: Left;  146mn  ? right ankle surgery     ? fracture has 2 screws 07/07/1997  ? right carpel tunnel     ? 05/16/1992  ? right shoulder replacement     ? 01/27/2006  ? SCROTAL EXPLORATION Right 02/26/2017  ? Procedure: SCROTUM EXPLORATION;  Surgeon: BNickie Retort MD;  Location: [facility] ORS;  Service: Urology;  Laterality: Right;  ? TEE WITHOUT CARDIOVERSION N/A 09/20/2018  ? Procedure: TRANSESOPHAGEAL ECHOCARDIOGRAM (TEE);  Surgeon: NJosue Hector MD;  [facility];  Service: Cardiovascular;  Laterality: N/A;  ? TOTAL KNEE ARTHROPLASTY Left 07/13/2015  ? Procedure: LEFT  TOTAL KNEE ARTHROPLASTY;  Surgeon: Gaynelle Arabian, MD;  Location: [facility] ORS;  Service: Orthopedics;  Laterality: Left;  ? ? ?FAMILY HISTORY ?Family History  ?Problem Relation Age of Onset  ? Cancer Mother   ?     pancreatic  ? Diabetes Mother   ? Stroke Mother   ? Heart disease Mother   ? Hyperlipidemia Mother   ? Hypertension Mother   ? Heart attack Mother   ? Heart disease Father   ? Stroke Father   ? Diabetes Father   ? Hypertension Father   ? Heart attack Father   ? Hyperlipidemia Father   ? Pancreatic cancer Father   ? Cancer Sister   ? Cancer Brother   ?     lung  ? Cancer Brother   ? Kidney cancer Neg Hx   ? Bladder Cancer Neg Hx   ? Prostate cancer Neg Hx   ? ? ?SOCIAL HISTORY ?Social History  ? ?Tobacco Use  ? Smoking status: Former  ?  Packs/day: 1.00  ?  Years: 10.00  ?  Pack years: 10.00  ?  Types: Cigarettes  ?  Quit date: 07/07/1984  ?  Years since quitting: 37.3  ? Smokeless tobacco: Never  ? Tobacco comments:  ?  Former Smoker quit 1986 07/23/2021  ?Vaping Use  ? Vaping Use: Never used  ?Substance Use Topics  ? Alcohol use: No  ?  Alcohol/week: 0.0 standard drinks  ? Drug use: No  ? ?  ? ?  ? ?OPHTHALMIC EXAM: ? ?Base Eye Exam   ? ? Visual Acuity (ETDRS)   ? ?   Right Left  ? Dist [place] 20/25 -1 20/20 -1  ? ?  ?  ? ? Tonometry (Tonopen, 9:36 AM)   ? ?   Right Left  ? Pressure 9 10  ? ?  ?  ? ?  Pupils   ? ?   Pupils APD  ? Right PERRL None  ? Left PERRL None  ? ?  ?  ? ? Visual Fields   ? ?   Left Right  ?  Full Full  ? ?  ?  ? ? Extraocular Movement   ? ?   Right Left  ?  Full Full  ? ?  ?  ? ?

## 2021-10-23 NOTE — Assessment & Plan Note (Signed)
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

## 2021-10-23 NOTE — Assessment & Plan Note (Signed)
No sign of CNVM OS by OCT 

## 2021-10-24 NOTE — Telephone Encounter (Signed)
Paperwork was completed and faxed back to [facility] for patient.  ?

## 2021-10-25 DIAGNOSIS — G4733 Obstructive sleep apnea (adult) (pediatric): Secondary | ICD-10-CM | POA: Diagnosis not present

## 2021-10-25 DIAGNOSIS — R569 Unspecified convulsions: Secondary | ICD-10-CM | POA: Diagnosis not present

## 2021-10-25 DIAGNOSIS — I10 Essential (primary) hypertension: Secondary | ICD-10-CM | POA: Diagnosis not present

## 2021-10-25 DIAGNOSIS — I69351 Hemiplegia and hemiparesis following cerebral infarction affecting right dominant side: Secondary | ICD-10-CM | POA: Diagnosis not present

## 2021-10-25 DIAGNOSIS — E1151 Type 2 diabetes mellitus with diabetic peripheral angiopathy without gangrene: Secondary | ICD-10-CM | POA: Diagnosis not present

## 2021-10-25 DIAGNOSIS — D692 Other nonthrombocytopenic purpura: Secondary | ICD-10-CM | POA: Diagnosis not present

## 2021-10-25 DIAGNOSIS — I2510 Atherosclerotic heart disease of native coronary artery without angina pectoris: Secondary | ICD-10-CM | POA: Diagnosis not present

## 2021-10-25 DIAGNOSIS — J189 Pneumonia, unspecified organism: Secondary | ICD-10-CM | POA: Diagnosis not present

## 2021-10-25 DIAGNOSIS — E1159 Type 2 diabetes mellitus with other circulatory complications: Secondary | ICD-10-CM | POA: Diagnosis not present

## 2021-10-25 DIAGNOSIS — Z87891 Personal history of nicotine dependence: Secondary | ICD-10-CM | POA: Diagnosis not present

## 2021-10-25 DIAGNOSIS — I4891 Unspecified atrial fibrillation: Secondary | ICD-10-CM | POA: Diagnosis not present

## 2021-10-25 DIAGNOSIS — J449 Chronic obstructive pulmonary disease, unspecified: Secondary | ICD-10-CM | POA: Diagnosis not present

## 2021-10-25 DIAGNOSIS — H353290 Exudative age-related macular degeneration, unspecified eye, stage unspecified: Secondary | ICD-10-CM | POA: Diagnosis not present

## 2021-10-27 NOTE — Patient Instructions (Signed)

## 2021-11-01 ENCOUNTER — Ambulatory Visit (INDEPENDENT_AMBULATORY_CARE_PROVIDER_SITE_OTHER): Payer: PPO | Admitting: Nurse Practitioner

## 2021-11-01 ENCOUNTER — Encounter: Payer: Self-pay | Admitting: Nurse Practitioner

## 2021-11-01 VITALS — BP 113/73 | HR 78 | Temp 98.1°F | Wt 201.8 lb

## 2021-11-01 DIAGNOSIS — I4819 Other persistent atrial fibrillation: Secondary | ICD-10-CM

## 2021-11-01 DIAGNOSIS — D696 Thrombocytopenia, unspecified: Secondary | ICD-10-CM | POA: Diagnosis not present

## 2021-11-01 DIAGNOSIS — I152 Hypertension secondary to endocrine disorders: Secondary | ICD-10-CM | POA: Diagnosis not present

## 2021-11-01 DIAGNOSIS — D62 Acute posthemorrhagic anemia: Secondary | ICD-10-CM

## 2021-11-01 DIAGNOSIS — M79672 Pain in left foot: Secondary | ICD-10-CM | POA: Diagnosis not present

## 2021-11-01 DIAGNOSIS — M25552 Pain in left hip: Secondary | ICD-10-CM | POA: Diagnosis not present

## 2021-11-01 DIAGNOSIS — E1159 Type 2 diabetes mellitus with other circulatory complications: Secondary | ICD-10-CM | POA: Diagnosis not present

## 2021-11-01 NOTE — Assessment & Plan Note (Signed)
Chronic, ongoing with BP below neuro goal <130/80 without medication on board at this time, stopped in hospital April 2023.  Recommend he monitor BP at home daily and document.  Continue focus on DASH diet and regular exercise at home.  CBC today.  Recommend he wear compression hose at home to avoid syncopal episodes and orthostatic BP.  If elevations in BP present will restart ARB at lower dosing. ?

## 2021-11-01 NOTE — Assessment & Plan Note (Signed)
Improving since fall, with no fractures on imaging. Can return to water aerobics and daily ADLs. ?

## 2021-11-01 NOTE — Assessment & Plan Note (Signed)
Chronic, stable with cardioversion on 07/04/21.  Continue collaboration with cardiology.  Reached out to cardiology via secure chat to discuss anticoagulant.  At this time maintain off Eliquis or ASA due to fall risk and recent injuries. ?

## 2021-11-01 NOTE — Assessment & Plan Note (Signed)
Ongoing since fall with fluid filled hematoma that is reducing in size.  Can return to water aerobics and regular ADLs.  Was seen by ortho recently with no changes made. ?

## 2021-11-01 NOTE — Progress Notes (Signed)
? ?BP 113/73   Pulse 78   Temp 98.1 ?F (36.7 ?C) (Oral)   Wt 201 lb 12.8 oz (91.5 kg)   SpO2 97%   BMI 29.80 kg/m?   ? ?Subjective:  ? ? Patient ID: Andrew Cobb, male    DOB: 1951/02/13, 71 y.o.   MRN: [id number] ? ?HPI: ?Andrew Cobb is a 71 y.o. male ? ?Chief Complaint  ?Patient presents with  ? Hip Pain  ?  States improvement on hip/foot pain.   ? Foot Pain  ? Anemia  ? ?Continues to improve since recent fall and AKI.  Recent labs show H/H trending up and swelling to left lower hip area is reducing.  Remains off Telmisartan at this time and BP continues to be stable at home.  Continues off ASA and Eliquis -- which cardiology recommended after recent injury. ? ?HIP & FOOT PAIN ?Continues to have pain to left hip since fall -- more stiff, continues to have hematoma anterior/lateral aspect.  Placing ice/heat to area and using Voltaren.  Also noted to be having pain left foot, top aspect, has bruising to area.  Saw ortho and overall no changes made. He reports overall hip and foot are improving. ?Duration: chronic ?Involved hip: left  ?Mechanism of injury: unknown ?Location: anterior ?Onset: gradual  ?Severity: 5/10  ?Quality: sharp, dull, aching, and throbbing ?Frequency: constant ?Radiation: no ?Aggravating factors: walking, movement, and prolonged sitting   ?Alleviating factors: ice and APAP  ?Status: fluctuating ?Treatments attempted: rest, ice, and APAP   ?Relief with NSAIDs?: No NSAIDs Taken ?Weakness with weight bearing: no ?Weakness with walking: no ?Paresthesias / decreased sensation: no ?Swelling: yes ?Redness:no ?Fevers: no  ? ?Relevant past medical, surgical, family and social history reviewed and updated as indicated. Interim medical history since our last visit reviewed. ?Allergies and medications reviewed and updated. ? ?Review of Systems  ?Constitutional:  Negative for activity change, diaphoresis, fatigue and fever.  ?Respiratory:  Negative for cough, chest tightness, shortness of breath  and wheezing.   ?Cardiovascular:  Negative for chest pain, palpitations and leg swelling.  ?Gastrointestinal: Negative.   ?Musculoskeletal:  Positive for arthralgias.  ?Neurological: Negative.   ?Psychiatric/Behavioral: Negative.    ? ?Per HPI unless specifically indicated above ? ?   ?Objective:  ?  ?BP 113/73   Pulse 78   Temp 98.1 ?F (36.7 ?C) (Oral)   Wt 201 lb 12.8 oz (91.5 kg)   SpO2 97%   BMI 29.80 kg/m?   ?Wt Readings from Last 3 Encounters:  ?11/01/21 201 lb 12.8 oz (91.5 kg)  ?10/18/21 203 lb 3.2 oz (92.2 kg)  ?10/11/21 206 lb 3.2 oz (93.5 kg)  ?  ?Physical Exam ?Vitals and nursing note reviewed.  ?Constitutional:   ?   General: He is awake. He is not in acute distress. ?   Appearance: He is well-developed and well-groomed. He is not ill-appearing.  ?HENT:  ?   Head: Normocephalic and atraumatic.  ?   Right Ear: Hearing normal. No drainage.  ?   Left Ear: Hearing normal. No drainage.  ?Eyes:  ?   General: Lids are normal.     ?   Right eye: No discharge.     ?   Left eye: No discharge.  ?   Conjunctiva/sclera: Conjunctivae normal.  ?   Pupils: Pupils are equal, round, and reactive to light.  ?Neck:  ?   Thyroid: No thyromegaly.  ?   Vascular: No carotid bruit or JVD.  ?  Trachea: Trachea normal.  ?Cardiovascular:  ?   Rate and Rhythm: Normal rate and regular rhythm.  ?   Pulses:     ?     Dorsalis pedis pulses are 2+ on the right side and 2+ on the left side.  ?     Posterior tibial pulses are 2+ on the right side and 2+ on the left side.  ?   Heart sounds: Normal heart sounds, S1 normal and S2 normal. No murmur heard. ?  No gallop.  ?Pulmonary:  ?   Effort: Pulmonary effort is normal. No accessory muscle usage or respiratory distress.  ?   Breath sounds: Normal breath sounds.  ?Abdominal:  ?   General: Bowel sounds are normal. There is no distension.  ?   Palpations: Abdomen is soft.  ?   Tenderness: There is no abdominal tenderness.  ?Musculoskeletal:     ?   General: Normal range of motion.  ?    Cervical back: Normal range of motion and neck supple.  ?   Right hip: Normal.  ?   Left hip: Tenderness (anterior) present. No bony tenderness or crepitus. Normal range of motion. Normal strength.  ?   Right lower leg: No edema.  ?   Left lower leg: No edema.  ?   Right foot: Normal range of motion.  ?   Left foot: Normal range of motion.  ?     Feet: ? ?   Comments: Small size, firm fluid filled hematoma to anterior aspect of left hip with minimal bruising -- overall improving.  ?Feet:  ?   Right foot:  ?   Protective Sensation: 10 sites tested.  10 sites sensed.  ?   Toenail Condition: Right toenails are normal.  ?   Left foot:  ?   Protective Sensation: 10 sites tested.  10 sites sensed.  ?   Toenail Condition: Left toenails are normal.  ?Skin: ?   General: Skin is warm and dry.  ?   Capillary Refill: Capillary refill takes less than 2 seconds.  ?   Comments: Scattered pale purple bruising upper extremities.  To left upper leg, posterior, mild bruising -- bruising extending to back just below rib line.  Yellow in color becoming paler in color.  ?Neurological:  ?   Mental Status: He is alert and oriented to person, place, and time.  ?   Cranial Nerves: Cranial nerves 2-12 are intact.  ?   Motor: Motor function is intact.  ?   Coordination: Coordination is intact.  ?   Gait: Gait is intact.  ?   Deep Tendon Reflexes: Reflexes are normal and symmetric.  ?Psychiatric:     ?   Attention and Perception: Attention normal.     ?   Mood and Affect: Mood normal.     ?   Speech: Speech normal.     ?   Behavior: Behavior normal. Behavior is cooperative.     ?   Thought Content: Thought content normal.  ? ?Results for orders placed or performed in visit on 10/21/21  ?CUP PACEART REMOTE DEVICE CHECK  ?Result Value Ref Range  ? Date Time Interrogation Session [phone]   ? Pulse Insurance claims handler MERM   ? Pulse Gen Model [id number] Reveal LINQ   ? Pulse Gen Serial Number [id number]   ? Clinic Name [facility]   ?  Implantable Pulse Generator Type ICM/ILR   ? Implantable Pulse Generator Implant Date [id number]   ? ?   ?  Assessment & Plan:  ? ?Problem List Items Addressed This Visit   ? ?  ? Cardiovascular and Mediastinum  ? Hypertension associated with diabetes ([place])  ?  Chronic, ongoing with BP below neuro goal <130/80 without medication on board at this time, stopped in hospital April 2023.  Recommend he monitor BP at home daily and document.  Continue focus on DASH diet and regular exercise at home.  CBC today.  Recommend he wear compression hose at home to avoid syncopal episodes and orthostatic BP.  If elevations in BP present will restart ARB at lower dosing. ? ?  ?  ? Relevant Medications  ? empagliflozin (JARDIANCE) 25 MG TABS tablet  ? Persistent atrial fibrillation (HCC) - Primary  ?  Chronic, stable with cardioversion on 07/04/21.  Continue collaboration with cardiology.  Reached out to cardiology via secure chat to discuss anticoagulant.  At this time maintain off Eliquis or ASA due to fall risk and recent injuries. ? ?  ?  ?  ? Other  ? ABLA (acute blood loss anemia)  ?  Continue off Eliquis and ASA at this time, still has bruising present to upper left leg and cardiology has recommend maintaining off.  Recheck CBC today.  Consider GI or hematology referral if ongoing anemia present.  Improved symptoms present today. ? ?  ?  ? Relevant Orders  ? CBC with Differential/Platelet  ? Left foot pain  ?  Improving since fall, with no fractures on imaging. Can return to water aerobics and daily ADLs. ? ?  ?  ? Left hip pain  ?  Ongoing since fall with fluid filled hematoma that is reducing in size.  Can return to water aerobics and regular ADLs.  Was seen by ortho recently with no changes made. ? ?  ?  ?  ? ?Follow up plan: ?Return in about 10 weeks (around 01/10/2022) for T2DM, HTN/HLD, SEIZURES, A-FIB, OSA. ? ? ? ? ? ?

## 2021-11-01 NOTE — Assessment & Plan Note (Signed)
Continue off Eliquis and ASA at this time, still has bruising present to upper left leg and cardiology has recommend maintaining off.  Recheck CBC today.  Consider GI or hematology referral if ongoing anemia present.  Improved symptoms present today. ?

## 2021-11-02 LAB — CBC WITH DIFFERENTIAL/PLATELET
Basophils Absolute: 0 10*3/uL (ref 0.0–0.2)
Basos: 1 %
EOS (ABSOLUTE): 0.1 10*3/uL (ref 0.0–0.4)
Eos: 2 %
Hematocrit: 38.4 % (ref 37.5–51.0)
Hemoglobin: 12.5 g/dL — ABNORMAL LOW (ref 13.0–17.7)
Immature Grans (Abs): 0 10*3/uL (ref 0.0–0.1)
Immature Granulocytes: 0 %
Lymphocytes Absolute: 0.7 10*3/uL (ref 0.7–3.1)
Lymphs: 18 %
MCH: 31.3 pg (ref 26.6–33.0)
MCHC: 32.6 g/dL (ref 31.5–35.7)
MCV: 96 fL (ref 79–97)
Monocytes Absolute: 0.3 10*3/uL (ref 0.1–0.9)
Monocytes: 8 %
Neutrophils Absolute: 2.8 10*3/uL (ref 1.4–7.0)
Neutrophils: 71 %
Platelets: 170 10*3/uL (ref 150–450)
RBC: 3.99 x10E6/uL — ABNORMAL LOW (ref 4.14–5.80)
RDW: 12.9 % (ref 11.6–15.4)
WBC: 4 10*3/uL (ref 3.4–10.8)

## 2021-11-02 NOTE — Progress Notes (Signed)
Contacted via [place] ? ? ?Good morning Andrew Cobb, your labs have returned and hemoglobin and hematocrit continue to trend upwards to normal.  [place] news!!  You are improving.  Any questions? ?Keep being stellar!!  Thank you for allowing me to participate in your care.  I appreciate you. ?Kindest regards, ?Misao Fackrell ?

## 2021-11-08 NOTE — Progress Notes (Signed)
Carelink Summary Report / Loop Recorder 

## 2021-11-12 ENCOUNTER — Encounter: Payer: Self-pay | Admitting: Nurse Practitioner

## 2021-11-12 ENCOUNTER — Ambulatory Visit (INDEPENDENT_AMBULATORY_CARE_PROVIDER_SITE_OTHER): Payer: PPO | Admitting: Nurse Practitioner

## 2021-11-12 VITALS — BP 148/66 | HR 58 | Temp 97.5°F | Ht 69.0 in | Wt 202.4 lb

## 2021-11-12 DIAGNOSIS — I999 Unspecified disorder of circulatory system: Secondary | ICD-10-CM | POA: Diagnosis not present

## 2021-11-12 DIAGNOSIS — E1159 Type 2 diabetes mellitus with other circulatory complications: Secondary | ICD-10-CM | POA: Diagnosis not present

## 2021-11-12 DIAGNOSIS — I152 Hypertension secondary to endocrine disorders: Secondary | ICD-10-CM

## 2021-11-12 MED ORDER — TELMISARTAN 20 MG PO TABS: 20.0000 mg | ORAL_TABLET | Freq: Every day | ORAL | 4 refills | Status: DC

## 2021-11-12 NOTE — Patient Instructions (Signed)

## 2021-11-12 NOTE — Assessment & Plan Note (Signed)
Chronic, ongoing with SBP trending up at this time.  Will restart Telmisartan at lower dosing or 20 MG daily and monitor closely .  Recommend he monitor BP at home daily and document.  Continue focus on DASH diet and regular exercise at home.  CBC today.  Recommend he wear compression hose at home to avoid syncopal episodes and orthostatic BP.  If elevations in BP continue will adjust medication further.  Ensure good hydration at home.  Return in 5 weeks. ?

## 2021-11-12 NOTE — Progress Notes (Signed)
? ?BP (!) 148/66 (BP Location: Left Arm, Patient Position: Sitting, Cuff Size: Normal)   Pulse (!) 58   Temp (!) 97.5 ?F (36.4 ?C) (Oral)   Ht '5\' 9"'$  (1.753 m)   Wt 202 lb 6.4 oz (91.8 kg)   SpO2 98%   BMI 29.89 kg/m?   ? ?Subjective:  ? ? Patient ID: Andrew Cobb, male    DOB: 1951/07/01, 71 y.o.   MRN: [id number] ? ?HPI: ?Andrew Cobb is a 71 y.o. male ? ?Chief Complaint  ?Patient presents with  ? Cyst  ?  Patient is here for cyst on R arm. Patient states he noticed it about a couple of weeks ago. Patient denies having any pain in the area, but irritable. Patient denies increase in size in the area.   ? Hypertension  ?  Patient states his blood reading have been elevated for the past couple weeks. Patient wife says patient may need to go back his blood pressure medication.   ? ?HYPERTENSION ?Stopped BP medications in April 2023 due to syncopal episode, but now BP has been creeping back up since 11/01/21.  Was on Telmisartan 40 MG prior. ?Hypertension status: uncontrolled  ?Satisfied with current treatment? yes ?Duration of hypertension: chronic ?BP monitoring frequency:  daily ?BP range: 150/70 range ?BP medication side effects:  no ?Medication compliance: good compliance ?Previous BP meds: Telmisartan ?Aspirin: no ?Recurrent headaches: no ?Visual changes: no ?Palpitations: no ?Dyspnea: no ?Chest pain: no ?Lower extremity edema: no ?Dizzy/lightheaded: no  ? ?SKIN CYST ?Two weeks ago to right forearm -- no pain or discomfort with this. ?Duration: weeks ?Location: right forearm ?Painful: no ?Itching: no ?Onset: gradual ?Context: not changing ?Associated signs and symptoms: none ?History of skin cancer: no ?History of precancerous skin lesions: no ?Family history of skin cancer: no  ? ?Relevant past medical, surgical, family and social history reviewed and updated as indicated. Interim medical history since our last visit reviewed. ?Allergies and medications reviewed and updated. ? ?Review of Systems   ?Constitutional:  Negative for activity change, diaphoresis, fatigue and fever.  ?Respiratory:  Negative for cough, chest tightness, shortness of breath and wheezing.   ?Cardiovascular:  Negative for chest pain, palpitations and leg swelling.  ?Gastrointestinal: Negative.   ?Neurological: Negative.   ?Psychiatric/Behavioral: Negative.    ? ?Per HPI unless specifically indicated above ? ?   ?Objective:  ?  ?BP (!) 148/66 (BP Location: Left Arm, Patient Position: Sitting, Cuff Size: Normal)   Pulse (!) 58   Temp (!) 97.5 ?F (36.4 ?C) (Oral)   Ht '5\' 9"'$  (1.753 m)   Wt 202 lb 6.4 oz (91.8 kg)   SpO2 98%   BMI 29.89 kg/m?   ?Wt Readings from Last 3 Encounters:  ?11/12/21 202 lb 6.4 oz (91.8 kg)  ?11/01/21 201 lb 12.8 oz (91.5 kg)  ?10/18/21 203 lb 3.2 oz (92.2 kg)  ?  ?Physical Exam ?Vitals and nursing note reviewed.  ?Constitutional:   ?   General: He is awake. He is not in acute distress. ?   Appearance: He is well-developed and well-groomed. He is not ill-appearing.  ?HENT:  ?   Head: Normocephalic and atraumatic.  ?   Right Ear: Hearing normal. No drainage.  ?   Left Ear: Hearing normal. No drainage.  ?Eyes:  ?   General: Lids are normal.     ?   Right eye: No discharge.     ?   Left eye: No discharge.  ?  Conjunctiva/sclera: Conjunctivae normal.  ?   Pupils: Pupils are equal, round, and reactive to light.  ?Neck:  ?   Thyroid: No thyromegaly.  ?   Vascular: No carotid bruit or JVD.  ?   Trachea: Trachea normal.  ?Cardiovascular:  ?   Rate and Rhythm: Normal rate and regular rhythm.  ?   Heart sounds: Normal heart sounds, S1 normal and S2 normal. No murmur heard. ?  No gallop.  ?Pulmonary:  ?   Effort: Pulmonary effort is normal. No accessory muscle usage or respiratory distress.  ?   Breath sounds: Normal breath sounds.  ?Abdominal:  ?   General: Bowel sounds are normal.  ?   Palpations: Abdomen is soft. There is no hepatomegaly or splenomegaly.  ?Musculoskeletal:     ?   General: Normal range of motion.  ?    Cervical back: Normal range of motion and neck supple.  ?   Right lower leg: No edema.  ?   Left lower leg: No edema.  ?Skin: ?   General: Skin is warm and dry.  ?   Capillary Refill: Capillary refill takes less than 2 seconds.  ? ?    ?Neurological:  ?   Mental Status: He is alert and oriented to person, place, and time.  ?   Deep Tendon Reflexes: Reflexes are normal and symmetric.  ?Psychiatric:     ?   Attention and Perception: Attention normal.     ?   Mood and Affect: Mood normal.     ?   Speech: Speech normal.     ?   Behavior: Behavior normal. Behavior is cooperative.     ?   Thought Content: Thought content normal.  ? ? ?Results for orders placed or performed in visit on 11/01/21  ?CBC with Differential/Platelet  ?Result Value Ref Range  ? WBC 4.0 3.4 - 10.8 x10E3/uL  ? RBC 3.99 (L) 4.14 - 5.80 x10E6/uL  ? Hemoglobin 12.5 (L) 13.0 - 17.7 g/dL  ? Hematocrit 38.4 37.5 - 51.0 %  ? MCV 96 79 - 97 fL  ? MCH 31.3 26.6 - 33.0 pg  ? MCHC 32.6 31.5 - 35.7 g/dL  ? RDW 12.9 11.6 - 15.4 %  ? Platelets 170 150 - 450 x10E3/uL  ? Neutrophils 71 Not Estab. %  ? Lymphs 18 Not Estab. %  ? Monocytes 8 Not Estab. %  ? Eos 2 Not Estab. %  ? Basos 1 Not Estab. %  ? Neutrophils Absolute 2.8 1.4 - 7.0 x10E3/uL  ? Lymphocytes Absolute 0.7 0.7 - 3.1 x10E3/uL  ? Monocytes Absolute 0.3 0.1 - 0.9 x10E3/uL  ? EOS (ABSOLUTE) 0.1 0.0 - 0.4 x10E3/uL  ? Basophils Absolute 0.0 0.0 - 0.2 x10E3/uL  ? Immature Granulocytes 0 Not Estab. %  ? Immature Grans (Abs) 0.0 0.0 - 0.1 x10E3/uL  ? ?   ?Assessment & Plan:  ? ?Problem List Items Addressed This Visit   ? ?  ? Cardiovascular and Mediastinum  ? Hypertension associated with diabetes ([place]) - Primary  ?  Chronic, ongoing with SBP trending up at this time.  Will restart Telmisartan at lower dosing or 20 MG daily and monitor closely .  Recommend he monitor BP at home daily and document.  Continue focus on DASH diet and regular exercise at home.  CBC today.  Recommend he wear compression hose at home  to avoid syncopal episodes and orthostatic BP.  If elevations in BP continue will adjust medication further.  Ensure good hydration at home.  Return in 5 weeks. ? ?  ?  ? Relevant Medications  ? telmisartan (MICARDIS) 20 MG tablet  ? ?Other Visit Diagnoses   ? ? Blood vessel disorder      ? Small, outpouching to vessel right forearm, no pain or tenderness.  No s/s infection.  Monitor at this time and if any changes to alert provider.  ? Relevant Medications  ? telmisartan (MICARDIS) 20 MG tablet  ? ?  ?  ? ?Follow up plan: ?Return in about 5 weeks (around 12/17/2021) for HTN. ? ? ? ? ? ?

## 2021-11-20 DIAGNOSIS — H02831 Dermatochalasis of right upper eyelid: Secondary | ICD-10-CM | POA: Diagnosis not present

## 2021-11-20 DIAGNOSIS — E119 Type 2 diabetes mellitus without complications: Secondary | ICD-10-CM | POA: Diagnosis not present

## 2021-11-20 DIAGNOSIS — H02834 Dermatochalasis of left upper eyelid: Secondary | ICD-10-CM | POA: Diagnosis not present

## 2021-11-20 DIAGNOSIS — H35371 Puckering of macula, right eye: Secondary | ICD-10-CM | POA: Diagnosis not present

## 2021-11-20 DIAGNOSIS — H353122 Nonexudative age-related macular degeneration, left eye, intermediate dry stage: Secondary | ICD-10-CM | POA: Diagnosis not present

## 2021-11-20 DIAGNOSIS — H353211 Exudative age-related macular degeneration, right eye, with active choroidal neovascularization: Secondary | ICD-10-CM | POA: Diagnosis not present

## 2021-11-20 DIAGNOSIS — H40013 Open angle with borderline findings, low risk, bilateral: Secondary | ICD-10-CM | POA: Diagnosis not present

## 2021-11-20 DIAGNOSIS — Z961 Presence of intraocular lens: Secondary | ICD-10-CM | POA: Diagnosis not present

## 2021-11-24 LAB — CUP PACEART REMOTE DEVICE CHECK
Date Time Interrogation Session: 20230517232054
Implantable Pulse Generator Implant Date: 20200316

## 2021-11-25 ENCOUNTER — Ambulatory Visit (INDEPENDENT_AMBULATORY_CARE_PROVIDER_SITE_OTHER): Payer: PPO

## 2021-11-25 DIAGNOSIS — I639 Cerebral infarction, unspecified: Secondary | ICD-10-CM

## 2021-11-28 ENCOUNTER — Telehealth: Payer: Self-pay

## 2021-11-28 NOTE — Chronic Care Management (AMB) (Signed)
Chronic Care Management Pharmacy Assistant   Name: Andrew Cobb  MRN: [id number] DOB: 1950/12/11  Reason for Encounter: Disease State Diabetes Mellitus   Recent office visits:  11/12/21-Jolene T. [name], NP (PCP) Seen for a cyst and hypertension visit. Restart on Telmisartan 20 mg. Follow up in 5 weeks.  11/01/21-Jolene T. Ned Card, NP (PCP) General follow up visit. Labs ordered. Follow up in 10 weeks.  10/18/21-Jolene T. Ned Card, NP (PCP) General follow up visit. Labs ordered. X-ray ordered of foot and Ambulatory referral to Orthopedics. Follow up in 10 days. 10/11/21-Jolene T. Ned Card, NP (PCP) General follow up visit. Labs ordered. Ambulatory referral to Home Health. Follow up in 1 week. 09/16/21-[facility] (Cardiology) Notes not available.  08/28/21-Gary A. [name], MD (Ophthalmology) Seen for macular degeneration. Follow up in 6 weeks.  08/12/21-[facility] (Cardiology) Notes not available.   Recent consult visits:  10/23/21-Gary A. [name], MD (Retina Specialist) Seen for Macular degeneration. Follow up in 6 weeks.  10/22/21-Matthew Babish ([facility]) Seen for left hip hematoma.   Hospital visits:  Medication Reconciliation was completed by comparing discharge summary, patient's EMR and Pharmacy list, and upon discussion with patient.  Admitted to the hospital on 10/04/21 due to a fall. Discharge date was 10/05/21. Discharged from [facility]?Medications Started at [facility] Discharge:?? -started none noted  Medication Changes at Hospital Discharge: -Changed none noted  Medications Discontinued at Hospital Discharge: -Stopped none noted  Medications that remain the same after Hospital Discharge:??  -All other medications will remain the same.    Medications: Outpatient Encounter Medications as of 11/28/2021  Medication Sig   acyclovir (ZOVIRAX) 400 MG tablet Take 1 tablet (400 mg total) by mouth 2 (two) times daily.   albuterol  (VENTOLIN HFA) 108 (90 Base) MCG/ACT inhaler Inhale 2 puffs into the lungs every 6 (six) hours as needed for wheezing or shortness of breath.   amitriptyline (ELAVIL) 25 MG tablet Take 1 tablet (25 mg total) by mouth at bedtime.   atorvastatin (LIPITOR) 40 MG tablet Take 1 tablet (40 mg total) by mouth at bedtime.   cetirizine (ZYRTEC) 10 MG tablet Take 10 mg by mouth daily.   Cholecalciferol (VITAMIN D) 2000 units CAPS Take 2,000 Units by mouth daily.   Cyanocobalamin (B-12) 5000 MCG CAPS Take 5,000 mcg by mouth every other day.   empagliflozin (JARDIANCE) 25 MG TABS tablet Take by mouth daily.   fluticasone (FLONASE) 50 MCG/ACT nasal spray Place 2 sprays into both nostrils daily.   glucose blood (ONETOUCH ULTRA) test strip 1 each by Other route 2 (two) times daily. Use as instructed   Lancets (ONETOUCH ULTRASOFT) lancets USE 1  TO CHECK GLUCOSE TWICE DAILY   levETIRAcetam (KEPPRA) 500 MG tablet Take 1 tablet (500 mg total) by mouth 2 (two) times daily.   metFORMIN (GLUCOPHAGE) 1000 MG tablet Take 1 tablet (1,000 mg total) by mouth 2 (two) times daily with a meal.   Multiple Vitamins-Minerals (MULTIVITAMIN PO) Take 1 tablet by mouth daily.    tamsulosin (FLOMAX) 0.4 MG CAPS capsule Take 1 capsule (0.4 mg total) by mouth at bedtime.   telmisartan (MICARDIS) 20 MG tablet Take 1 tablet (20 mg total) by mouth daily.   No facility-administered encounter medications on file as of 11/28/2021.   Current antihypertensive regimen:  Telmisartan 20 mg take 1 tab daily  How often are you checking your Blood Pressure? daily  Current home BP readings:  11/28/21-125/74  What recent interventions/DTPs have been made by any provider to improve Blood Pressure control since last CPP Visit: None noted  Any recent hospitalizations or ED visits since last visit with CPP? Yes  What diet changes have been made to improve Blood Pressure Control?  Patient states he has not made any diet changes, he has  been eating about the same things.  What exercise is being done to improve your Blood Pressure Control?  Patient states he goes to the [facility] everyday and does water aerobics.  Adherence Review: Is the patient currently on ACE/ARB medication? Yes Does the patient have >5 day gap between last estimated fill dates? No   Care Gaps: Zoster Vaccines:Never done  Star Rating Drugs: Telmisartan 20 mg Last filled:11/13/21 90 DS Jardiance 25 mg Last filled:None noted Metformin 1000 mg Last filled:11/20/21 90 DS Atorvastatin 40 mg Last filled:10/19/21 90 DS  Myriam Elta [place], [place]

## 2021-12-09 ENCOUNTER — Encounter (INDEPENDENT_AMBULATORY_CARE_PROVIDER_SITE_OTHER): Payer: PPO | Admitting: Ophthalmology

## 2021-12-10 NOTE — Progress Notes (Signed)
Carelink Summary Report / Loop Recorder 

## 2021-12-10 NOTE — Progress Notes (Unsigned)
Electrophysiology Office Note:    Date:  12/11/2021   ID:  Andrew Cobb, DOB 01-11-1951, MRN [id number]  PCP:  Andrew Lick, NP  [facility] Cardiologist:  None  [facility] Electrophysiologist:  Andrew Peru, MD   Referring MD: Andrew Lick, NP   Chief Complaint: Atrial fibrillation  History of Present Illness:    Andrew Cobb is a 71 y.o. male who presents for an evaluation of atrial fibrillation at the request of Andrew Mantle, NP. Their medical history includes prostate cancer, coronary artery disease, diabetes, GERD, hypertension, hyperlipidemia, sleep apnea on BiPAP, stroke.  The patient was last seen by Andrew Cobb on Nov 12, 2021.  During an April appointment with Andrew Cobb, the patient was noted to be off of anticoagulation given her fall risk and recent injuries.  The patient had a previous cardioversion for his atrial fibrillation in December 2022.  I met the patient when he was hospitalized in March after a fall complicated by left hip hematoma.  His hemoglobin dropped 4 points due to significant blood loss in the soft tissues.  When I met the patient October 05, 2021, I discussed left atrial appendage occlusion as a stroke mitigation strategy that would allow him to avoid long-term exposure to anticoagulation.  He presents today to discuss this further.      Past Medical History:  Diagnosis Date   Arthritis    Asthma    has not needed since 6/21   Cancer [facility])    prostate   Chronic venous insufficiency    varicose vein lower extremity with inflammation   Coronary artery disease 1996   two stents placed    Diabetes mellitus without complication ([place])    type 2 on metformin   GERD (gastroesophageal reflux disease)    no issues since gastric bypass surgery as stated per pt   Hyperlipidemia    Hypertension    Hypogonadism in male    MRSA (methicillin resistant Staphylococcus aureus) infection    07/30/2008 thru 08/07/2008  abdominal abcess    Sleep apnea    on BIPAP   Stented coronary artery    Stroke ([place]) 09/07/2019   no defecits   Thrombocythemia     Past Surgical History:  Procedure Laterality Date   ANGIOPLASTY     ANGIOPLASTY     with stent 04/07/1995   ANTERIOR CERVICAL DECOMPRESSION/DISCECTOMY FUSION 4 LEVELS N/A 08/17/2017   Procedure: Anterior discectomy with fusion and plate fixation Cervical Three-Four, Four-Five, Five-Six, and Six-Seven Fusion;  Surgeon: Ditty, Kevan Ny, MD;  Location: [place];  Service: Neurosurgery;  Laterality: N/A;  Anterior discectomy with fusion and plate fixation Cervical Three-Four, Four-Five, Five-Six, and Six-Seven Fusion    APPENDECTOMY     1966   BUBBLE STUDY  09/20/2018   Procedure: BUBBLE STUDY;  Surgeon: Josue Hector, MD;  Location: [facility] ENDOSCOPY;  Service: Cardiovascular;;   [place] TEST     07/31/2011   CARDIOVERSION N/A 07/04/2021   Procedure: CARDIOVERSION;  Surgeon: [name], MD;  Location: [place] ENDOSCOPY;  Service: Cardiovascular;  Laterality: N/A;   CARPAL TUNNEL RELEASE Left    CHOLECYSTECTOMY     2006   COLONOSCOPY WITH PROPOFOL N/A 03/26/2021   Procedure: COLONOSCOPY WITH PROPOFOL;  Surgeon: Jonathon Bellows, MD;  Location: [facility] ENDOSCOPY;  Service: Gastroenterology;  Laterality: N/A;   colonscopy      08/25/2012   EYE SURGERY Bilateral    cataract  FUNCTIONAL ENDOSCOPIC SINUS SURGERY     11/10/2013   GASTRIC BYPASS     10/05/2012   HERNIA REPAIR     left inguinal 1981   INCISION AND DRAINAGE ABSCESS Right 02/26/2017   Procedure: INCISION AND DRAINAGE ABSCESS;  Surgeon: Nickie Retort, MD;  Location: [facility] ORS;  Service: Urology;  Laterality: Right;   JOINT REPLACEMENT     bilateral   left ankle surgery      05/03/2003    left carpel tunnel      09/18/1993   left knee meniscal tear      01/25/2010   left knee meniscal tear repair      05/04/1996   left rotator cuff repair      05/03/2003    LOOP  RECORDER INSERTION N/A 09/20/2018   Procedure: LOOP RECORDER INSERTION;  Surgeon: Evans Lance, MD;  Location: [place] CV LAB;  Service: Cardiovascular;  Laterality: N/A;   REPLACEMENT TOTAL KNEE BILATERAL  07/13/2015   REVERSE SHOULDER ARTHROPLASTY Left 08/30/2020   Procedure: REVERSE SHOULDER ARTHROPLASTY;  Surgeon: Justice Britain, MD;  Location: [facility] ORS;  Service: Orthopedics;  Laterality: Left;  168mn   right ankle surgery      fracture has 2 screws 07/07/1997   right carpel tunnel      05/16/1992   right shoulder replacement      01/27/2006   SCROTAL EXPLORATION Right 02/26/2017   Procedure: SCROTUM EXPLORATION;  Surgeon: BNickie Retort MD;  Location: [facility] ORS;  Service: Urology;  Laterality: Right;   TEE WITHOUT CARDIOVERSION N/A 09/20/2018   Procedure: TRANSESOPHAGEAL ECHOCARDIOGRAM (TEE);  Surgeon: NJosue Hector MD;  [facility];  Service: Cardiovascular;  Laterality: N/A;   TOTAL KNEE ARTHROPLASTY Left 07/13/2015   Procedure: LEFT TOTAL KNEE ARTHROPLASTY;  Surgeon: FGaynelle Arabian MD;  Location: [facility];  Service: Orthopedics;  Laterality: Left;    Current Medications: Current Meds  Medication Sig   acyclovir (ZOVIRAX) 400 MG tablet Take 1 tablet (400 mg total) by mouth 2 (two) times daily.   albuterol (VENTOLIN HFA) 108 (90 Base) MCG/ACT inhaler Inhale 2 puffs into the lungs every 6 (six) hours as needed for wheezing or shortness of breath.   amitriptyline (ELAVIL) 25 MG tablet Take 1 tablet (25 mg total) by mouth at bedtime.   atorvastatin (LIPITOR) 40 MG tablet Take 1 tablet (40 mg total) by mouth at bedtime.   cetirizine (ZYRTEC) 10 MG tablet Take 10 mg by mouth daily.   Cholecalciferol (VITAMIN D) 2000 units CAPS Take 2,000 Units by mouth daily.   Cyanocobalamin (B-12) 5000 MCG CAPS Take 5,000 mcg by mouth every other day.   empagliflozin (JARDIANCE) 25 MG TABS tablet Take by mouth daily.   fluticasone (FLONASE) 50 MCG/ACT nasal spray Place 2 sprays into  both nostrils daily.   glucose blood (ONETOUCH ULTRA) test strip 1 each by Other route 2 (two) times daily. Use as instructed   Lancets (ONETOUCH ULTRASOFT) lancets USE 1  TO CHECK GLUCOSE TWICE DAILY   levETIRAcetam (KEPPRA) 500 MG tablet Take 1 tablet (500 mg total) by mouth 2 (two) times daily.   metFORMIN (GLUCOPHAGE) 1000 MG tablet Take 1 tablet (1,000 mg total) by mouth 2 (two) times daily with a meal.   Multiple Vitamins-Minerals (MULTIVITAMIN PO) Take 1 tablet by mouth daily.    tamsulosin (FLOMAX) 0.4 MG CAPS capsule Take 1 capsule (0.4 mg total) by mouth at bedtime.   telmisartan (MICARDIS) 20 MG tablet Take 1 tablet (20  mg total) by mouth daily.     Allergies:   Other, Succinylsulphathiazole, Sulfamethoxazole-trimethoprim, and Tetracyclines & related   Social History   Socioeconomic History   Marital status: Married    Spouse name: Not on file   Number of children: Not on file   Years of education: Not on file   Highest education level: Some college, no degree  Occupational History    Comment: drives for [employer]   Tobacco Use   Smoking status: Former    Packs/day: 1.00    Years: 10.00    Pack years: 10.00    Types: Cigarettes    Quit date: 07/07/1984    Years since quitting: 37.4   Smokeless tobacco: Never   Tobacco comments:    Former Smoker quit 1986 07/23/2021  Vaping Use   Vaping Use: Never used  Substance and Sexual Activity   Alcohol use: No    Alcohol/week: 0.0 standard drinks   Drug use: No   Sexual activity: Yes  Other Topics Concern   Not on file  Social History Narrative   Not on file   Social Determinants of Health   Financial Resource Strain: Low Risk    Difficulty of Paying Living Expenses: Not hard at all  Food Insecurity: No Food Insecurity   Worried About Charity fundraiser in the Last Year: Never true   [place] in the Last Year: Never true  Transportation Needs: No Transportation Needs   Lack of Transportation (Medical): No    Lack of Transportation (Non-Medical): No  Physical Activity: Sufficiently Active   Days of Exercise per Week: 4 days   Minutes of Exercise per Session: 50 min  Stress: No Stress Concern Present   Feeling of Stress : Not at all  Social Connections: Socially Integrated   Frequency of Communication with Friends and Family: Twice a week   Frequency of Social Gatherings with Friends and Family: More than three times a week   Attends Religious Services: More than 4 times per year   Active Member of [employer] or Organizations: Yes   Attends Music therapist: More than 4 times per year   Marital Status: Married     Family History: The patient's family history includes Cancer in his brother, brother, mother, and sister; Diabetes in his father and mother; Heart attack in his father and mother; Heart disease in his father and mother; Hyperlipidemia in his father and mother; Hypertension in his father and mother; Pancreatic cancer in his father; Stroke in his father and mother. There is no history of Kidney cancer, Bladder Cancer, or Prostate cancer.  ROS:   Please see the history of present illness.    All other systems reviewed and are negative.  EKGs/Labs/Other Studies Reviewed:    The following studies were reviewed today:      Recent Labs: 10/11/2021: ALT 27; BUN 23; Creatinine, Ser 1.09; Potassium 4.2; Sodium 142; TSH 1.490 11/01/2021: Hemoglobin 12.5; Platelets 170  Recent Lipid Panel    Component Value Date/Time   CHOL 97 (L) 10/11/2021 0940   CHOL 111 01/25/2018 0815   TRIG 84 10/11/2021 0940   TRIG 150 (H) 01/25/2018 0815   HDL 53 10/11/2021 0940   CHOLHDL 2.6 09/08/2019 0350   VLDL 15 09/08/2019 0350   VLDL 30 (H) 01/25/2018 0815   LDLCALC 27 10/11/2021 0940    Physical Exam:    VS:  BP (!) 150/82   Pulse 62   Ht '5\' 9"'  (1.753  m)   Wt 201 lb (91.2 kg)   SpO2 98%   BMI 29.68 kg/m     Wt Readings from Last 3 Encounters:  12/11/21 201 lb (91.2 kg)  11/12/21  202 lb 6.4 oz (91.8 kg)  11/01/21 201 lb 12.8 oz (91.5 kg)     GEN:  Well nourished, well developed in no acute distress HEENT: Normal NECK: No JVD; No carotid bruits LYMPHATICS: No lymphadenopathy CARDIAC: RRR, no murmurs, rubs, gallops RESPIRATORY:  Clear to auscultation without rales, wheezing or rhonchi  ABDOMEN: Soft, non-tender, non-distended MUSCULOSKELETAL:  No edema; No deformity  SKIN: Warm and dry NEUROLOGIC:  Alert and oriented x 3 PSYCHIATRIC:  Normal affect       ASSESSMENT:    1. Persistent atrial fibrillation ([place])   2. Primary hypertension   3. ABLA (acute blood loss anemia)    PLAN:    In order of problems listed above:  #Persistent AF #Acute blood loss anemia  Multiple prior falls, one with large hematoma with significant blood loss into the soft tissue. Now recovered with much improved Hgb. He remains off AC with a history of multiple prior strokes.  I have seen Andrew Cobb in the office today who is being considered for a Watchman left atrial appendage closure device. I believe they will benefit from this procedure given their history of atrial fibrillation, CHA2DS2-VASc score of 6 and unadjusted ischemic stroke rate of 9.7% per year. Unfortunately, the patient is not felt to be a long term anticoagulation candidate secondary to history of falls complicated by significant bleeding/anemia. The patient's chart has been reviewed and I feel that they would be a candidate for short term oral anticoagulation after Watchman implant.   It is my belief that after undergoing a LAA closure procedure, Andrew Cobb will not need long term anticoagulation which eliminates anticoagulation side effects and major bleeding risk.   Procedural risks for the Watchman implant have been reviewed with the patient including a 0.5% risk of stroke, <1% risk of perforation and <1% risk of device embolization.    The published clinical data on the safety and effectiveness of  WATCHMAN include but are not limited to the following: - Holmes DR, Mechele Claude, Sick P et al. for the PROTECT AF Investigators. Percutaneous closure of the left atrial appendage versus warfarin therapy for prevention of stroke in patients with atrial fibrillation: a randomised non-inferiority trial. Lancet 2009; 374: 534-42. Mechele Claude, Doshi SK, Abelardo Diesel D et al. on behalf of the PROTECT AF Investigators. Percutaneous Left Atrial Appendage Closure for Stroke Prophylaxis in Patients With Atrial Fibrillation 2.3-Year Follow-up of the PROTECT AF (Watchman Left Atrial Appendage System for Embolic Protection in Patients With Atrial Fibrillation) Trial. Circulation 2013; 127:720-729. - Alli O, Doshi S,  Kar S, Reddy VY, Sievert H et al. Quality of Life Assessment in the Randomized PROTECT AF (Percutaneous Closure of the Left Atrial Appendage Versus Warfarin Therapy for Prevention of Stroke in Patients With Atrial Fibrillation) Trial of Patients at Risk for Stroke With Nonvalvular Atrial Fibrillation. J Am Coll Cardiol 2013; 03:0092-3. Vertell Limber DR, Tarri Abernethy, Price M, [place], Sievert H, Doshi S, Huber K, Reddy V. Prospective randomized evaluation of the Watchman left atrial appendage Device in patients with atrial fibrillation versus long-term warfarin therapy; the PREVAIL trial. Journal of the [employer] of Cardiology, Vol. 4, No. 1, 2014, 1-11. - Kar S, Doshi SK, Sadhu A, Horton R, Osorio J et al. Primary outcome evaluation  of a next-generation left atrial appendage closure device: results from the PINNACLE FLX trial. Circulation 2021;143(18)1754-1762.    After today's visit with the patient which was dedicated solely for shared decision making visit regarding LAA closure device, the patient decided to proceed with the LAA appendage closure procedure scheduled to be done in the near future at [facility]. Prior to the procedure, I would like to obtain a gated CT scan of the chest with  contrast timed for PV/LA visualization.    HAS-BLED score 4 Hypertension Yes  Abnormal renal and liver function (Dialysis, transplant, Cr >2.26 mg/dL /Cirrhosis or Bilirubin >2x Normal or AST/ALT/AP >3x Normal) No  Stroke Yes  Bleeding Yes  Labile INR (Unstable/high INR) No  Elderly (>65) Yes  Drugs or alcohol (? 8 drinks/week, anti-plt or NSAID) No   CHA2DS2-VASc Score = 6  The patient's score is based upon: CHF History: 0 HTN History: 1 Diabetes History: 1 Stroke History: 2 Vascular Disease History: 1 Age Score: 1 Gender Score: 0   Signed,  Vickie Epley, MD    12/11/2021 8:57 AM          Total time spent with patient today 45 minutes. This includes reviewing records, evaluating the patient and coordinating care.  Medication Adjustments/Labs and Tests Ordered: Current medicines are reviewed at length with the patient today.  Concerns regarding medicines are outlined above.  No orders of the defined types were placed in this encounter.  No orders of the defined types were placed in this encounter.    Signed, Hilton Cork. Quentin Ore, MD, [facility], [facility] 12/11/2021 8:57 AM    Electrophysiology [facility]

## 2021-12-11 ENCOUNTER — Other Ambulatory Visit
Admission: RE | Admit: 2021-12-11 | Discharge: 2021-12-11 | Disposition: A | Discharge status: Home / Self Care | Payer: PPO | Attending: Cardiology | Admitting: Cardiology

## 2021-12-11 ENCOUNTER — Encounter: Payer: Self-pay | Admitting: *Deleted

## 2021-12-11 ENCOUNTER — Ambulatory Visit (INDEPENDENT_AMBULATORY_CARE_PROVIDER_SITE_OTHER): Payer: PPO | Admitting: Cardiology

## 2021-12-11 ENCOUNTER — Encounter: Payer: Self-pay | Admitting: Cardiology

## 2021-12-11 VITALS — BP 150/82 | HR 62 | Ht 69.0 in | Wt 201.0 lb

## 2021-12-11 DIAGNOSIS — Z01818 Encounter for other preprocedural examination: Secondary | ICD-10-CM | POA: Diagnosis not present

## 2021-12-11 DIAGNOSIS — Z01812 Encounter for preprocedural laboratory examination: Secondary | ICD-10-CM | POA: Insufficient documentation

## 2021-12-11 DIAGNOSIS — I10 Essential (primary) hypertension: Secondary | ICD-10-CM | POA: Diagnosis not present

## 2021-12-11 DIAGNOSIS — I4819 Other persistent atrial fibrillation: Secondary | ICD-10-CM | POA: Diagnosis not present

## 2021-12-11 DIAGNOSIS — D62 Acute posthemorrhagic anemia: Secondary | ICD-10-CM | POA: Insufficient documentation

## 2021-12-11 LAB — BASIC METABOLIC PANEL
Anion gap: 6 (ref 5–15)
BUN: 21 mg/dL (ref 8–23)
CO2: 27 mmol/L (ref 22–32)
Calcium: 9.4 mg/dL (ref 8.9–10.3)
Chloride: 105 mmol/L (ref 98–111)
Creatinine, Ser: 0.96 mg/dL (ref 0.61–1.24)
GFR, Estimated: >60 mL/min (ref >60)
Glucose, Bld: 150 mg/dL — ABNORMAL HIGH (ref 70–99)
Potassium: 4.5 mmol/L (ref 3.5–5.1)
Sodium: 138 mmol/L (ref 135–145)

## 2021-12-11 NOTE — Patient Instructions (Signed)
Medications: Your physician recommends that you continue on your current medications as directed. Please refer to the Current Medication list given to you today. *If you need a refill on your cardiac medications before your next appointment, please call your pharmacy*  Lab Work: BMP If you have labs (blood work) drawn today and your tests are completely normal, you will receive your results only by: [place] (if you have MyChart) OR A paper copy in the mail If you have any lab test that is abnormal or we need to change your treatment, we will call you to review the results.  Testing/Procedures: Your physician has requested that you have cardiac CT. Cardiac computed tomography (CT) is a painless test that uses an x-ray machine to take clear, detailed pictures of your heart. For further information please visit [email or web address]. Please follow instruction sheet as given.    Follow-Up: At [facility], you and your health needs are our priority.  As part of our continuing mission to provide you with exceptional heart care, we have created designated Provider Care Teams.  These Care Teams include your primary Cardiologist (physician) and Advanced Practice Providers (APPs -  Physician Assistants and Nurse Practitioners) who all work together to provide you with the care you need, when you need it.  Your physician wants you to follow-up in: Lenice Llamas, the [facility] Nurse Navigator, will call you after your CT once the [facility] Team has reviewed your imaging for an update on proceedings. [place]'s direct number is [phone] if you need assistance.   We recommend signing up for the patient portal called "MyChart".  Sign up information is provided on this After Visit Summary.  MyChart is used to connect with patients for Virtual Visits (Telemedicine).  Patients are able to view lab/test results, encounter notes, upcoming appointments, etc.  Non-urgent messages can be sent to your provider as  well.   To learn more about what you can do with MyChart, go to [email or web address].    Any Other Special Instructions Will Be Listed Below (If Applicable).   Left Atrial Appendage Closure Device Implantation  Left atrial appendage (LAA) closure device implantation is a procedure to put a small device in the LAA of the heart. The LAA is a small sac in the wall of the heart's left upper chamber. Blood clots can form in the LAA in people with atrial fibrillation (AFib). The device closes the LAA to help prevent a blood clot and stroke. AFib is a type of irregular or rapid heartbeat (arrhythmia). There is an increased risk of blood clots and stroke with AFib. This procedure helps to reduce that risk. Tell a health care provider about: Any allergies you have. All medicines you are taking, including vitamins, herbs, eye drops, creams, and over-the-counter medicines. Any problems you or family members have had with anesthetic medicines. Any blood disorders you have. Any surgeries you have had. Any medical conditions you have. Whether you are pregnant or may be pregnant. What are the risks? Generally, this is a safe procedure. However, problems may occur, including: Infection. Bleeding. Allergic reactions to medicines or dyes. Damage to nearby structures or organs. Heart attack. Stroke. Blood clots. Changes in heart rhythm. Device failure. What happens before the procedure? Staying hydrated Follow instructions from your health care provider about hydration, which may include: Up to 2 hours before the procedure - you may continue to drink clear liquids, such as water, clear fruit juice, black coffee, and plain tea. Eating and drinking restrictions Follow instructions from  your health care provider about eating and drinking, which may include: 8 hours before the procedure - stop eating heavy meals or foods, such as meat, fried foods, or fatty foods. 6 hours before the procedure - stop  eating light meals or foods, such as toast or cereal. 6 hours before the procedure - stop drinking milk or drinks that contain milk. 2 hours before the procedure - stop drinking clear liquids. Medicines Ask your health care provider about: Changing or stopping your regular medicines. This is especially important if you are taking diabetes medicines or blood thinners. Taking medicines such as aspirin and ibuprofen. These medicines can thin your blood. Do not take these medicines unless your health care provider tells you to take them. Taking over-the-counter medicines, vitamins, herbs, and supplements. Tests You may have blood tests and a physical exam. You may have an electrocardiogram (ECG). This test checks your heart's electrical patterns and rhythms. General instructions Do not use any products that contain nicotine or tobacco. These include cigarettes, chewing tobacco, and vaping devices, such as e-cigarettes. If you need help quitting, ask your health care provider. Ask your health care provider what steps will be taken to help prevent infection. These steps may include: Removing hair at the surgery site. Washing skin with a germ-killing soap. Taking antibiotic medicine. Plan to have a responsible adult take you home from the hospital or clinic. Plan to have a responsible adult care for you for the time you are told after you leave the hospital or clinic. This is important. What happens during the procedure? An IV will be inserted into one of your veins. You will be given one or more of the following: A medicine to help you relax (sedative). A medicine to make you fall asleep (general anesthetic). A small incision will be made in your groin area. A small wire will be put through the incision and into a blood vessel. Dye may be injected so X-rays can be used to guide the wire through the blood vessel. A long, thin tube (catheter) will be put over the small wire and moved up through  the blood vessel to reach your heart. The closure device will be moved through the catheter until it reaches your heart. A small hole will be made in the septum (transseptal puncture). The septum is a thin tissue that separates the upper two chambers of the heart. The device will be placed so that it closes the LAA. X-rays will be done to make sure the device is in the right place. The catheter and wire will be removed. The closure device will remain in your heart. After pressure is applied over the catheter site to prevent bleeding, a bandage (dressing) will be placed over the site where the catheter was inserted. The procedure may vary among health care providers and hospitals. What happens after the procedure? Your blood pressure, heart rate, breathing rate, and blood oxygen level will be monitored until you leave the hospital or clinic. You may have to wear compression stockings. These stockings help to prevent blood clots and reduce swelling in your legs. If you were given a sedative during the procedure, it can affect you for several hours. Do not drive or operate machinery until your health care provider says it is safe. You may be given pain medicine. You may need to drink more fluids to wash (flush) the dye out of your body. Drink enough fluid to keep your urine pale yellow. Take over-the-counter and prescription medicines only as  told by your health care provider. This is especially important if you were given blood thinners. Summary Left atrial appendage (LAA) closure device implantation is a procedure that is done to put a small device in the LAA of the heart. The LAA is a small sac in the wall of the heart's left upper chamber. The device closes the LAA to prevent stroke and other problems. Follow instructions from your health care provider before and after the procedure. This information is not intended to replace advice given to you by your health care provider. Make sure you discuss  any questions you have with your health care provider. Document Revised: 03/01/2020 Document Reviewed: 03/01/2020 Elsevier Patient Education  [place].

## 2021-12-14 NOTE — Patient Instructions (Incomplete)
Will increase Telmisartan back to 40 MG -- sent in prescription, for now finish out the 20 MG tablets by taking 2 a day.  DASH Eating Plan DASH stands for Dietary Approaches to Stop Hypertension. The DASH eating plan is a healthy eating plan that has been shown to: Reduce high blood pressure (hypertension). Reduce your risk for type 2 diabetes, heart disease, and stroke. Help with weight loss. What are tips for following this plan? Reading food labels Check food labels for the amount of salt (sodium) per serving. Choose foods with less than 5 percent of the Daily Value of sodium. Generally, foods with less than 300 milligrams (mg) of sodium per serving fit into this eating plan. To find whole grains, look for the word "whole" as the first word in the ingredient list. Shopping Buy products labeled as "low-sodium" or "no salt added." Buy fresh foods. Avoid canned foods and pre-made or frozen meals. Cooking Avoid adding salt when cooking. Use salt-free seasonings or herbs instead of table salt or sea salt. Check with your health care provider or pharmacist before using salt substitutes. Do not fry foods. Cook foods using healthy methods such as baking, boiling, grilling, roasting, and broiling instead. Cook with heart-healthy oils, such as olive, canola, avocado, soybean, or sunflower oil. Meal planning  Eat a balanced diet that includes: 4 or more servings of fruits and 4 or more servings of vegetables each day. Try to fill one-half of your plate with fruits and vegetables. 6-8 servings of whole grains each day. Less than 6 oz (170 g) of lean meat, poultry, or fish each day. A 3-oz (85-g) serving of meat is about the same size as a deck of cards. One egg equals 1 oz (28 g). 2-3 servings of low-fat dairy each day. One serving is 1 cup (237 mL). 1 serving of nuts, seeds, or beans 5 times each week. 2-3 servings of heart-healthy fats. Healthy fats called omega-3 fatty acids are found in foods  such as walnuts, flaxseeds, fortified milks, and eggs. These fats are also found in cold-water fish, such as sardines, salmon, and mackerel. Limit how much you eat of: Canned or prepackaged foods. Food that is high in trans fat, such as some fried foods. Food that is high in saturated fat, such as fatty meat. Desserts and other sweets, sugary drinks, and other foods with added sugar. Full-fat dairy products. Do not salt foods before eating. Do not eat more than 4 egg yolks a week. Try to eat at least 2 vegetarian meals a week. Eat more home-cooked food and less restaurant, buffet, and fast food. Lifestyle When eating at a restaurant, ask that your food be prepared with less salt or no salt, if possible. If you drink alcohol: Limit how much you use to: 0-1 drink a day for women who are not pregnant. 0-2 drinks a day for men. Be aware of how much alcohol is in your drink. In the [place] one drink equals one 12 oz bottle of beer (355 mL), one 5 oz glass of wine (148 mL), or one 1 oz glass of hard liquor (44 mL). General information Avoid eating more than 2,300 mg of salt a day. If you have hypertension, you may need to reduce your sodium intake to 1,500 mg a day. Work with your health care provider to maintain a healthy body weight or to lose weight. Ask what an ideal weight is for you. Get at least 30 minutes of exercise that causes your heart to  beat faster (aerobic exercise) most days of the week. Activities may include walking, swimming, or biking. Work with your health care provider or dietitian to adjust your eating plan to your individual calorie needs. What foods should I eat? Fruits All fresh, dried, or frozen fruit. Canned fruit in natural juice (without added sugar). Vegetables Fresh or frozen vegetables (raw, steamed, roasted, or grilled). Low-sodium or reduced-sodium tomato and vegetable juice. Low-sodium or reduced-sodium tomato sauce and tomato paste. Low-sodium or  reduced-sodium canned vegetables. Grains Whole-grain or whole-wheat bread. Whole-grain or whole-wheat pasta. Brown rice. [place]. Bulgur. Whole-grain and low-sodium cereals. Pita bread. Low-fat, low-sodium crackers. Whole-wheat flour tortillas. Meats and other proteins Skinless chicken or [place]. Ground chicken or [place]. Pork with fat trimmed off. Fish and seafood. Egg whites. Dried beans, peas, or lentils. Unsalted nuts, nut butters, and seeds. Unsalted canned beans. Lean cuts of beef with fat trimmed off. Low-sodium, lean precooked or cured meat, such as sausages or meat loaves. Dairy Low-fat (1%) or fat-free (skim) milk. Reduced-fat, low-fat, or fat-free cheeses. Nonfat, low-sodium ricotta or cottage cheese. Low-fat or nonfat yogurt. Low-fat, low-sodium cheese. Fats and oils Soft margarine without trans fats. Vegetable oil. Reduced-fat, low-fat, or light mayonnaise and salad dressings (reduced-sodium). Canola, safflower, olive, avocado, soybean, and sunflower oils. Avocado. Seasonings and condiments Herbs. Spices. Seasoning mixes without salt. Other foods Unsalted popcorn and pretzels. Fat-free sweets. The items listed above may not be a complete list of foods and beverages you can eat. Contact a dietitian for more information. What foods should I avoid? Fruits Canned fruit in a light or heavy syrup. Fried fruit. Fruit in cream or butter sauce. Vegetables Creamed or fried vegetables. Vegetables in a cheese sauce. Regular canned vegetables (not low-sodium or reduced-sodium). Regular canned tomato sauce and paste (not low-sodium or reduced-sodium). Regular tomato and vegetable juice (not low-sodium or reduced-sodium). Angie Fava. Olives. Grains Baked goods made with fat, such as croissants, muffins, or some breads. Dry pasta or rice meal packs. Meats and other proteins Fatty cuts of meat. Ribs. Fried meat. Berniece Salines. Bologna, salami, and other precooked or cured meats, such as sausages or  meat loaves. Fat from the back of a pig (fatback). Bratwurst. Salted nuts and seeds. Canned beans with added salt. Canned or smoked fish. Whole eggs or egg yolks. Chicken or [place] with skin. Dairy Whole or 2% milk, cream, and half-and-half. Whole or full-fat cream cheese. Whole-fat or sweetened yogurt. Full-fat cheese. Nondairy creamers. Whipped toppings. Processed cheese and cheese spreads. Fats and oils Butter. Stick margarine. Lard. Shortening. Ghee. Bacon fat. Tropical oils, such as coconut, palm kernel, or palm oil. Seasonings and condiments Onion salt, garlic salt, seasoned salt, table salt, and sea salt. Worcestershire sauce. Tartar sauce. Barbecue sauce. Teriyaki sauce. Soy sauce, including reduced-sodium. Steak sauce. Canned and packaged gravies. Fish sauce. Oyster sauce. Cocktail sauce. Store-bought horseradish. Ketchup. Mustard. Meat flavorings and tenderizers. Bouillon cubes. Hot sauces. Pre-made or packaged marinades. Pre-made or packaged taco seasonings. Relishes. Regular salad dressings. Other foods Salted popcorn and pretzels. The items listed above may not be a complete list of foods and beverages you should avoid. Contact a dietitian for more information. Where to find more information National Heart, Lung, and Blood Institute: https://wilson-eaton.com/ American Heart Association: www.heart.org Academy of Nutrition and Dietetics: www.eatright.[place]: www.kidney.org Summary The DASH eating plan is a healthy eating plan that has been shown to reduce high blood pressure (hypertension). It may also reduce your risk for type 2 diabetes, heart disease, and stroke. When  on the DASH eating plan, aim to eat more fresh fruits and vegetables, whole grains, lean proteins, low-fat dairy, and heart-healthy fats. With the DASH eating plan, you should limit salt (sodium) intake to 2,300 mg a day. If you have hypertension, you may need to reduce your sodium intake to 1,500 mg a  day. Work with your health care provider or dietitian to adjust your eating plan to your individual calorie needs. This information is not intended to replace advice given to you by your health care provider. Make sure you discuss any questions you have with your health care provider. Document Revised: 05/27/2019 Document Reviewed: 05/27/2019 Elsevier Patient Education  [place].

## 2021-12-17 ENCOUNTER — Ambulatory Visit (INDEPENDENT_AMBULATORY_CARE_PROVIDER_SITE_OTHER): Payer: PPO | Admitting: Nurse Practitioner

## 2021-12-17 ENCOUNTER — Encounter: Payer: Self-pay | Admitting: Nurse Practitioner

## 2021-12-17 DIAGNOSIS — E1159 Type 2 diabetes mellitus with other circulatory complications: Secondary | ICD-10-CM

## 2021-12-17 DIAGNOSIS — I152 Hypertension secondary to endocrine disorders: Secondary | ICD-10-CM | POA: Diagnosis not present

## 2021-12-17 MED ORDER — TELMISARTAN 40 MG PO TABS: 40.0000 mg | ORAL_TABLET | Freq: Every day | ORAL | 4 refills | Status: DC

## 2021-12-17 NOTE — Progress Notes (Signed)
BP 135/72   Pulse (!) 58   Temp (!) 97.5 F (36.4 C) (Oral)   Ht '5\' 9"'$  (1.753 m)   Wt 202 lb 12.8 oz (92 kg)   SpO2 95%   BMI 29.95 kg/m    Subjective:    Patient ID: Andrew Cobb, male    DOB: 11/25/1950, 71 y.o.   MRN: [id number]  HPI: Andrew Cobb is a 71 y.o. male  Chief Complaint  Patient presents with   Hypertension    Patient is here for a follow up on Hypertension. Patient states he has noticed his BP reading has still be a little elevated. Patient says his systolic has been around 154'M and he says he checks every morning.    HYPERTENSION At visit on 11/12/21 restarted Telmisartan at 20 MG daily due to elevations in BP, medications had been stopped in April 2023 due to syncopal episode and dehydration.  No further dizzy or light-headed spells.  Saw cardiology on 12/11/21 last and is scheduled to have watchmen procedure he reports.   Hypertension status: stable  Satisfied with current treatment? yes Duration of hypertension: chronic BP monitoring frequency:  daily BP range: 150/75 before medication this morning, has been averaging higher top number BP medication side effects:  no Medication compliance: good compliance Aspirin: no Recurrent headaches: no Visual changes: no Palpitations: no Dyspnea: no Chest pain: no Lower extremity edema: no Dizzy/lightheaded: no   Relevant past medical, surgical, family and social history reviewed and updated as indicated. Interim medical history since our last visit reviewed. Allergies and medications reviewed and updated.  Review of Systems  Constitutional:  Negative for activity change, diaphoresis, fatigue and fever.  Respiratory:  Negative for cough, chest tightness, shortness of breath and wheezing.   Cardiovascular:  Negative for chest pain, palpitations and leg swelling.  Gastrointestinal: Negative.   Neurological: Negative.   Psychiatric/Behavioral: Negative.      Per HPI unless specifically indicated  above     Objective:    BP 135/72   Pulse (!) 58   Temp (!) 97.5 F (36.4 C) (Oral)   Ht '5\' 9"'$  (1.753 m)   Wt 202 lb 12.8 oz (92 kg)   SpO2 95%   BMI 29.95 kg/m   Wt Readings from Last 3 Encounters:  12/17/21 202 lb 12.8 oz (92 kg)  12/11/21 201 lb (91.2 kg)  11/12/21 202 lb 6.4 oz (91.8 kg)    Physical Exam Vitals and nursing note reviewed.  Constitutional:      General: He is awake. He is not in acute distress.    Appearance: He is well-developed and well-groomed. He is not ill-appearing.  HENT:     Head: Normocephalic and atraumatic.     Right Ear: Hearing normal. No drainage.     Left Ear: Hearing normal. No drainage.  Eyes:     General: Lids are normal.        Right eye: No discharge.        Left eye: No discharge.     Conjunctiva/sclera: Conjunctivae normal.     Pupils: Pupils are equal, round, and reactive to light.  Neck:     Thyroid: No thyromegaly.     Vascular: No carotid bruit or JVD.     Trachea: Trachea normal.  Cardiovascular:     Rate and Rhythm: Normal rate and regular rhythm.     Heart sounds: Normal heart sounds, S1 normal and S2 normal. No murmur heard.    No gallop.  Pulmonary:     Effort: Pulmonary effort is normal. No accessory muscle usage or respiratory distress.     Breath sounds: Normal breath sounds.  Abdominal:     General: Bowel sounds are normal.     Palpations: Abdomen is soft. There is no hepatomegaly or splenomegaly.  Musculoskeletal:        General: Normal range of motion.     Cervical back: Normal range of motion and neck supple.     Right lower leg: No edema.     Left lower leg: No edema.  Skin:    General: Skin is warm and dry.     Capillary Refill: Capillary refill takes less than 2 seconds.  Neurological:     Mental Status: He is alert and oriented to person, place, and time.     Deep Tendon Reflexes: Reflexes are normal and symmetric.  Psychiatric:        Attention and Perception: Attention normal.        Mood and  Affect: Mood normal.        Speech: Speech normal.        Behavior: Behavior normal. Behavior is cooperative.        Thought Content: Thought content normal.    Results for orders placed or performed during the hospital encounter of 70/26/37  Basic metabolic panel  Result Value Ref Range   Sodium 138 135 - 145 mmol/L   Potassium 4.5 3.5 - 5.1 mmol/L   Chloride 105 98 - 111 mmol/L   CO2 27 22 - 32 mmol/L   Glucose, Bld 150 (H) 70 - 99 mg/dL   BUN 21 8 - 23 mg/dL   Creatinine, Ser 0.96 0.61 - 1.24 mg/dL   Calcium 9.4 8.9 - 10.3 mg/dL   GFR, Estimated >60 >60 mL/min   Anion gap 6 5 - 15      Assessment & Plan:   Problem List Items Addressed This Visit       Cardiovascular and Mediastinum   Hypertension associated with diabetes ([place])    Chronic, ongoing with SBP remaining above goal on home readings.  Will increase Telmisartan back to original 40 MG daily dosing and monitor closely .  Recommend he monitor BP at home daily and document.  Continue focus on DASH diet and regular exercise at home.  Labs up to date.  Recommend he wear compression hose at home to avoid syncopal episodes and orthostatic BP + ensure goo hydration.  If elevations in BP continue will adjust medication further.  Return in July for A1c check.      Relevant Medications   telmisartan (MICARDIS) 40 MG tablet     Follow up plan: Return in about 27 days (around 01/13/2022) for T2DM, HTN/HLD, OSA, SEIZURES, ASTHMA, A FIB.

## 2021-12-17 NOTE — Assessment & Plan Note (Signed)
Chronic, ongoing with SBP remaining above goal on home readings.  Will increase Telmisartan back to original 40 MG daily dosing and monitor closely .  Recommend he monitor BP at home daily and document.  Continue focus on DASH diet and regular exercise at home.  Labs up to date.  Recommend he wear compression hose at home to avoid syncopal episodes and orthostatic BP + ensure goo hydration.  If elevations in BP continue will adjust medication further.  Return in July for A1c check.

## 2021-12-18 ENCOUNTER — Telehealth: Payer: Self-pay

## 2021-12-19 ENCOUNTER — Encounter (INDEPENDENT_AMBULATORY_CARE_PROVIDER_SITE_OTHER): Payer: PPO | Admitting: Ophthalmology

## 2021-12-19 ENCOUNTER — Ambulatory Visit (INDEPENDENT_AMBULATORY_CARE_PROVIDER_SITE_OTHER): Payer: PPO | Admitting: Ophthalmology

## 2021-12-19 ENCOUNTER — Encounter (INDEPENDENT_AMBULATORY_CARE_PROVIDER_SITE_OTHER): Payer: Self-pay | Admitting: Ophthalmology

## 2021-12-19 DIAGNOSIS — H353211 Exudative age-related macular degeneration, right eye, with active choroidal neovascularization: Secondary | ICD-10-CM | POA: Diagnosis not present

## 2021-12-19 MED ORDER — FARICIMAB-SVOA 6 MG/0.05ML IZ SOLN
6.0000 mg | INTRAVITREAL | Status: AC | PRN
  Administered 2021-12-19: 6 mg via INTRAVITREAL

## 2021-12-19 NOTE — Assessment & Plan Note (Addendum)
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  May be resistant to [facility].  Change to Vabysmo injection OD today shorten interval follow-up to 6 weeks to determine of efficacy of medication

## 2021-12-19 NOTE — Progress Notes (Signed)
12/19/2021     CHIEF COMPLAINT Patient presents for  Chief Complaint  Patient presents with   Macular Degeneration      HISTORY OF PRESENT ILLNESS: Andrew Cobb is a 71 y.o. male who presents to the clinic today for:   HPI   8 weeks Dilate OU, Vabysmo OCT OD. Patient states vision is stable and unchanged since last visit. Denies any new floaters or FOL. July 10th patient reports he will be having a surgery called [facility] due to 4 strokes within the last 4 years. Last edited by Laurin Coder on 12/19/2021  9:06 AM.      Referring physician: [name], NP [place],  [place] [address]  HISTORICAL INFORMATION:   Selected notes from the MEDICAL RECORD NUMBER    Lab Results  Component Value Date   HGBA1C 7.8 (H) 10/11/2021     CURRENT MEDICATIONS: No current outpatient medications on file. (Ophthalmic Drugs)   No current facility-administered medications for this visit. (Ophthalmic Drugs)   Current Outpatient Medications (Other)  Medication Sig   acyclovir (ZOVIRAX) 400 MG tablet Take 1 tablet (400 mg total) by mouth 2 (two) times daily.   albuterol (VENTOLIN HFA) 108 (90 Base) MCG/ACT inhaler Inhale 2 puffs into the lungs every 6 (six) hours as needed for wheezing or shortness of breath.   amitriptyline (ELAVIL) 25 MG tablet Take 1 tablet (25 mg total) by mouth at bedtime.   atorvastatin (LIPITOR) 40 MG tablet Take 1 tablet (40 mg total) by mouth at bedtime.   cetirizine (ZYRTEC) 10 MG tablet Take 10 mg by mouth daily.   Cholecalciferol (VITAMIN D) 2000 units CAPS Take 2,000 Units by mouth daily.   Cyanocobalamin (B-12) 5000 MCG CAPS Take 5,000 mcg by mouth every other day.   empagliflozin (JARDIANCE) 25 MG TABS tablet Take by mouth daily.   fluticasone (FLONASE) 50 MCG/ACT nasal spray Place 2 sprays into both nostrils daily.   glucose blood (ONETOUCH ULTRA) test strip 1 each by Other route 2 (two) times daily. Use as instructed    Lancets (ONETOUCH ULTRASOFT) lancets USE 1  TO CHECK GLUCOSE TWICE DAILY   levETIRAcetam (KEPPRA) 500 MG tablet Take 1 tablet (500 mg total) by mouth 2 (two) times daily.   metFORMIN (GLUCOPHAGE) 1000 MG tablet Take 1 tablet (1,000 mg total) by mouth 2 (two) times daily with a meal.   Multiple Vitamins-Minerals (MULTIVITAMIN PO) Take 1 tablet by mouth daily.    tamsulosin (FLOMAX) 0.4 MG CAPS capsule Take 1 capsule (0.4 mg total) by mouth at bedtime.   telmisartan (MICARDIS) 40 MG tablet Take 1 tablet (40 mg total) by mouth daily.   No current facility-administered medications for this visit. (Other)      REVIEW OF SYSTEMS: ROS   Negative for: Constitutional, Gastrointestinal, Neurological, Skin, Genitourinary, Musculoskeletal, HENT, Endocrine, Cardiovascular, Eyes, Respiratory, Psychiatric, Allergic/Imm, Heme/Lymph Last edited by Hurman Horn, MD on 12/19/2021  9:47 AM.       ALLERGIES Allergies  Allergen Reactions   Other Rash   Succinylsulphathiazole Rash   Sulfamethoxazole-Trimethoprim Rash   Tetracyclines & Related Rash    PAST MEDICAL HISTORY Past Medical History:  Diagnosis Date   Arthritis    Asthma    has not needed since 6/21   Cancer [facility])    prostate   Chronic venous insufficiency    varicose vein lower extremity with inflammation   Coronary artery disease 1996   two stents placed  Diabetes mellitus without complication ([place])    type 2 on metformin   GERD (gastroesophageal reflux disease)    no issues since gastric bypass surgery as stated per pt   Hyperlipidemia    Hypertension    Hypogonadism in male    MRSA (methicillin resistant Staphylococcus aureus) infection    07/30/2008 thru 08/07/2008  abdominal abcess   Sleep apnea    on BIPAP   Stented coronary artery    Stroke ([place]) 09/07/2019   no defecits   Thrombocythemia    Past Surgical History:  Procedure Laterality Date   ANGIOPLASTY     ANGIOPLASTY     with stent 04/07/1995   ANTERIOR  CERVICAL DECOMPRESSION/DISCECTOMY FUSION 4 LEVELS N/A 08/17/2017   Procedure: Anterior discectomy with fusion and plate fixation Cervical Three-Four, Four-Five, Five-Six, and Six-Seven Fusion;  Surgeon: Ditty, Kevan Ny, MD;  Location: [facility];  Service: Neurosurgery;  Laterality: N/A;  Anterior discectomy with fusion and plate fixation Cervical Three-Four, Four-Five, Five-Six, and Six-Seven Fusion    APPENDECTOMY     1966   BUBBLE STUDY  09/20/2018   Procedure: BUBBLE STUDY;  Surgeon: Josue Hector, MD;  Location: [facility] ENDOSCOPY;  Service: Cardiovascular;;   [place] TEST     07/31/2011   CARDIOVERSION N/A 07/04/2021   Procedure: CARDIOVERSION;  Surgeon: [name], MD;  Location: [facility] ENDOSCOPY;  Service: Cardiovascular;  Laterality: N/A;   CARPAL TUNNEL RELEASE Left    CHOLECYSTECTOMY     2006   COLONOSCOPY WITH PROPOFOL N/A 03/26/2021   Procedure: COLONOSCOPY WITH PROPOFOL;  Surgeon: Jonathon Bellows, MD;  Location[facility] ENDOSCOPY;  Service: Gastroenterology;  Laterality: N/A;   colonscopy      08/25/2012   EYE SURGERY Bilateral    cataract   FUNCTIONAL ENDOSCOPIC SINUS SURGERY     11/10/2013   GASTRIC BYPASS     10/05/2012   HERNIA REPAIR     left inguinal 1981   INCISION AND DRAINAGE ABSCESS Right 02/26/2017   Procedure: INCISION AND DRAINAGE ABSCESS;  Surgeon: Nickie Retort, MD;  Location: [facility] ORS;  Service: Urology;  Laterality: Right;   JOINT REPLACEMENT     bilateral   left ankle surgery      05/03/2003    left carpel tunnel      09/18/1993   left knee meniscal tear      01/25/2010   left knee meniscal tear repair      05/04/1996   left rotator cuff repair      05/03/2003    LOOP RECORDER INSERTION N/A 09/20/2018   Procedure: LOOP RECORDER INSERTION;  Surgeon: Evans Lance, MD;  Location: [facility] LAB;  Service: Cardiovascular;  Laterality: N/A;   REPLACEMENT TOTAL KNEE BILATERAL  07/13/2015   REVERSE SHOULDER  ARTHROPLASTY Left 08/30/2020   Procedure: REVERSE SHOULDER ARTHROPLASTY;  Surgeon: Justice Britain, MD;  Location: [facility] ORS;  Service: Orthopedics;  Laterality: Left;  130mn   right ankle surgery      fracture has 2 screws 07/07/1997   right carpel tunnel      05/16/1992   right shoulder replacement      01/27/2006   SCROTAL EXPLORATION Right 02/26/2017   Procedure: SCROTUM EXPLORATION;  Surgeon: BNickie Retort MD;  Location: [facility] ORS;  Service: Urology;  Laterality: Right;   TEE WITHOUT CARDIOVERSION N/A 09/20/2018   Procedure: TRANSESOPHAGEAL ECHOCARDIOGRAM (TEE);  Surgeon: NJosue Hector MD;  Location: [facility]  Service: Cardiovascular;  Laterality: N/A;   TOTAL KNEE ARTHROPLASTY Left 07/13/2015   Procedure: LEFT TOTAL KNEE ARTHROPLASTY;  Surgeon: Gaynelle Arabian, MD;  Location: [facility];  Service: Orthopedics;  Laterality: Left;    FAMILY HISTORY Family History  Problem Relation Age of Onset   Cancer Mother        pancreatic   Diabetes Mother    Stroke Mother    Heart disease Mother    Hyperlipidemia Mother    Hypertension Mother    Heart attack Mother    Heart disease Father    Stroke Father    Diabetes Father    Hypertension Father    Heart attack Father    Hyperlipidemia Father    Pancreatic cancer Father    Cancer Sister    Cancer Brother        lung   Cancer Brother    Kidney cancer Neg Hx    Bladder Cancer Neg Hx    Prostate cancer Neg Hx     SOCIAL HISTORY Social History   Tobacco Use   Smoking status: Former    Packs/day: 1.00    Years: 10.00    Total pack years: 10.00    Types: Cigarettes    Quit date: 07/07/1984    Years since quitting: 37.4   Smokeless tobacco: Never   Tobacco comments:    Former Smoker quit 1986 07/23/2021  Vaping Use   Vaping Use: Never used  Substance Use Topics   Alcohol use: No    Alcohol/week: 0.0 standard drinks of alcohol   Drug use: No         OPHTHALMIC EXAM:  Base Eye Exam     Visual Acuity (ETDRS)        Right Left   Dist [place] 20/40 -1 20/25 -1+2   Dist ph [place] 20/25 -1          Tonometry (Tonopen, 9:06 AM)       Right Left   Pressure 12 14         Pupils       Pupils Dark Light APD   Right PERRL 4 3 None   Left PERRL 4 3 None         Visual Fields (Counting fingers)       Left Right    Full Full         Extraocular Movement       Right Left    Full Full         Neuro/Psych     Oriented x3: Yes   Mood/Affect: Normal         Dilation     Both eyes: 1.0% Mydriacyl, 2.5% Phenylephrine @ 9:06 AM           Slit Lamp and Fundus Exam     External Exam       Right Left   External Normal Normal         Slit Lamp Exam       Right Left   Lids/Lashes Normal Normal   Conjunctiva/Sclera White and quiet White and quiet   Cornea Clear Clear   Anterior Chamber Deep and quiet Deep and quiet   Iris Round and reactive Round and reactive   Lens Posterior chamber intraocular lens Posterior chamber intraocular lens   Anterior Vitreous Normal Normal         Fundus Exam       Right Left   Posterior Vitreous Posterior vitreous detachment, Central vitreous floaters Posterior  vitreous detachment   Disc Normal Normal   C/D Ratio 0.6 0.5   Macula Hard drusen, no exudates, Macular thickening, Soft drusen, Pigmented atrophy, Retinal pigment epithelial mottling, Retinal pigment epithelial detachment Hard drusen, Early age related macular degeneration, no macular thickening, no exudates   Vessels Normal, , no DR Normal, , no DR   Periphery Normal Normal            IMAGING AND PROCEDURES  Imaging and Procedures for 12/19/21  OCT, Retina - OU - Both Eyes       Right Eye Quality was good. Scan locations included subfoveal. Central Foveal Thickness: 345. Progression has improved. Findings include no IRF, abnormal foveal contour, pigment epithelial detachment, subretinal fluid.   Left Eye Quality was good. Scan locations included subfoveal. Central  Foveal Thickness: 290. Progression has been stable. Findings include no IRF, no SRF, abnormal foveal contour, retinal drusen .   Notes Today OD at 8 week interval, small change with increased serous retinal detachment adjacent to the vascularized pigment epithelial detachment nasal to the FAZ.  We will repeat injection today however will change from Eylea to Vabysmo today at 8-week interval examination again in 6 weeks      Intravitreal Injection, Pharmacologic Agent - OD - Right Eye       Time Out 12/19/2021. 9:50 AM. Confirmed correct patient, procedure, site, and patient consented.   Anesthesia Topical anesthesia was used. Anesthetic medications included Lidocaine 4%.   Procedure Preparation included 5% betadine to ocular surface, 10% betadine to eyelids, Tobramycin 0.3%. A 30 gauge needle was used.   Injection: 6 mg faricimab-svoa 6 MG/0.05ML   Route: Intravitreal, Site: Right Eye   NDC: [phone], Lot: [id number], Expiration date: 06/29/2022, Waste: 0 mL   Post-op Post injection exam found visual acuity of at least counting fingers. The patient tolerated the procedure well. There were no complications. The patient received written and verbal post procedure care education. Post injection medications included ocuflox.              ASSESSMENT/PLAN:  Exudative age-related macular degeneration of right eye with active choroidal neovascularization ([place]) Persistent center involved retinal detachment from CNVM now on Eylea.  May be resistant to [facility].  Change to Vabysmo injection OD today shorten interval follow-up to 6 weeks to determine of efficacy of medication     ICD-10-CM   1. Exudative age-related macular degeneration of right eye with active choroidal neovascularization (HCC)  H35.3211 OCT, Retina - OU - Both Eyes    Intravitreal Injection, Pharmacologic Agent - OD - Right Eye    faricimab-svoa (VABYSMO) '6mg'$ /0.19m intravitreal injection      1.  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  Resistant to Avastin in the past and now on Eylea now for some 8 months with persistent activity may be resistant to Eylea.  2.  Commence therapy with Vabysmo injection OD today reevaluate next in 6 weeks  3.  Ophthalmic Meds Ordered this visit:  Meds ordered this encounter  Medications   faricimab-svoa (VABYSMO) '6mg'$ /0.059mintravitreal injection       Return in about 6 weeks (around 01/30/2022) for dilate, OD, VABYSMO OCT.  There are no Patient Instructions on file for this visit.   Explained the diagnoses, plan, and follow up with the patient and they expressed understanding.  Patient expressed understanding of the importance of proper follow up care.   GaClent Demarkankin M.D. Diseases & Surgery of the Retina and Vitreous [facility] 12/19/21     Abbreviations: M myopia (nearsighted); A astigmatism; H hyperopia (farsighted); P presbyopia; Mrx spectacle prescription;  CTL contact lenses; OD right eye; OS left eye; OU both eyes  XT exotropia; ET esotropia; PEK punctate epithelial keratitis; PEE punctate epithelial erosions; DES dry eye syndrome; MGD meibomian gland dysfunction; ATs artificial tears; PFAT's preservative free artificial tears; [place] nuclear sclerotic cataract; PSC posterior subcapsular cataract; ERM epi-retinal membrane; PVD posterior vitreous detachment; RD retinal detachment; DM diabetes mellitus; DR diabetic retinopathy; NPDR non-proliferative diabetic retinopathy; PDR proliferative diabetic retinopathy; CSME clinically significant macular edema; DME diabetic macular edema; dbh dot blot hemorrhages; CWS cotton wool spot; POAG primary open angle glaucoma; C/D cup-to-disc ratio; HVF humphrey visual field; GVF goldmann visual field; OCT optical coherence tomography; IOP intraocular pressure; BRVO Branch retinal vein occlusion; CRVO central retinal vein occlusion; CRAO central retinal artery occlusion; BRAO branch retinal  artery occlusion; RT retinal tear; SB scleral buckle; PPV pars plana vitrectomy; VH Vitreous hemorrhage; PRP panretinal laser photocoagulation; IVK intravitreal kenalog; VMT vitreomacular traction; MH Macular hole;  NVD neovascularization of the disc; NVE neovascularization elsewhere; AREDS age related eye disease study; ARMD age related macular degeneration; POAG primary open angle glaucoma; EBMD epithelial/anterior basement membrane dystrophy; ACIOL anterior chamber intraocular lens; IOL intraocular lens; PCIOL posterior chamber intraocular lens; Phaco/IOL phacoemulsification with intraocular lens placement; [place] photorefractive keratectomy; LASIK laser assisted in situ keratomileusis; HTN hypertension; DM diabetes mellitus; COPD chronic obstructive pulmonary disease

## 2021-12-25 NOTE — Telephone Encounter (Signed)
error 

## 2021-12-30 ENCOUNTER — Ambulatory Visit (INDEPENDENT_AMBULATORY_CARE_PROVIDER_SITE_OTHER): Payer: PPO

## 2021-12-30 DIAGNOSIS — I4819 Other persistent atrial fibrillation: Secondary | ICD-10-CM | POA: Diagnosis not present

## 2022-01-01 ENCOUNTER — Ambulatory Visit: Payer: PPO | Admitting: Nurse Practitioner

## 2022-01-01 LAB — CUP PACEART REMOTE DEVICE CHECK
Date Time Interrogation Session: 20230619232450
Implantable Pulse Generator Implant Date: 20200316

## 2022-01-02 ENCOUNTER — Telehealth (HOSPITAL_COMMUNITY): Payer: Self-pay | Admitting: *Deleted

## 2022-01-02 NOTE — Telephone Encounter (Signed)
Attempted to call patient regarding upcoming cardiac CT appointment. °Left message on voicemail with name and callback number ° °Janesia Joswick RN Navigator Cardiac Imaging °[facility] °[phone] Office °[phone] Cell ° °

## 2022-01-03 ENCOUNTER — Telehealth (HOSPITAL_COMMUNITY): Payer: Self-pay | Admitting: *Deleted

## 2022-01-03 NOTE — Telephone Encounter (Signed)
Reaching out to patient to offer assistance regarding upcoming cardiac imaging study; pt verbalizes understanding of appt date/time, parking situation and where to check in, pre-test NPO status and verified current allergies; name and call back number provided for further questions should they arise  Milka Windholz RN Navigator Cardiac Imaging [facility] [phone] office [phone] cell  Patient aware to arrive at 9am. 

## 2022-01-06 ENCOUNTER — Telehealth: Payer: Self-pay | Admitting: Cardiology

## 2022-01-06 ENCOUNTER — Ambulatory Visit (HOSPITAL_COMMUNITY)
Admission: RE | Admit: 2022-01-06 | Discharge: 2022-01-06 | Disposition: A | Discharge status: Home / Self Care | Payer: PPO | Source: Ambulatory Visit | Attending: Cardiology | Admitting: Cardiology

## 2022-01-06 ENCOUNTER — Encounter (HOSPITAL_COMMUNITY): Payer: Self-pay

## 2022-01-06 DIAGNOSIS — Z01818 Encounter for other preprocedural examination: Secondary | ICD-10-CM | POA: Diagnosis not present

## 2022-01-06 DIAGNOSIS — D62 Acute posthemorrhagic anemia: Secondary | ICD-10-CM | POA: Insufficient documentation

## 2022-01-06 DIAGNOSIS — I10 Essential (primary) hypertension: Secondary | ICD-10-CM | POA: Insufficient documentation

## 2022-01-06 DIAGNOSIS — I4819 Other persistent atrial fibrillation: Secondary | ICD-10-CM | POA: Diagnosis not present

## 2022-01-06 MED ORDER — IOHEXOL 350 MG/ML SOLN
100.0000 mL | Freq: Once | INTRAVENOUS | Status: AC | PRN
  Administered 2022-01-06: 100 mL via INTRAVENOUS

## 2022-01-06 NOTE — Telephone Encounter (Signed)
Left message for Andrew Cobb that the over-read on CT is available for viewing in Epic.   IMPRESSION: 1. Subpleural reticulation and signs of septal thickening with parenchymal scarring, arc like septal thickening in the subpleural RIGHT and LEFT chest RIGHT greater than LEFT. Findings favor post infectious or inflammatory fibrosis perhaps related to prior COVID-19 infection or viral pneumonia. Correlate clinically. 2. Subacute LEFT-sided rib fractures along the LEFT anterolateral chest at 2 adjacent levels. 3. Scout image shows diffuse mild-to-moderate bowel distension which could reflect ileus pattern. Correlate with any abdominal symptoms.  I contacted the pt and he denies any active respiratory symptoms.  The pt does have a history of COPD/Asthma and uses an inhaler, the pt also checks O2 sat daily and this runs 95-98%.   The pt does have a history of fall 2 months ago and he is already aware of rib fractures. The pt denies any GI symptoms.  Pt has a history of bariatric surgery.

## 2022-01-06 NOTE — Telephone Encounter (Signed)
Verdis Frederickson from [facility] calling to see if the CT results they faxed were received. Phone: [phone]

## 2022-01-08 ENCOUNTER — Encounter: Payer: Self-pay | Admitting: Nurse Practitioner

## 2022-01-08 ENCOUNTER — Ambulatory Visit: Payer: PPO | Admitting: Cardiology

## 2022-01-08 MED ORDER — ACYCLOVIR 400 MG PO TABS: 400.0000 mg | ORAL_TABLET | Freq: Two times a day (BID) | ORAL | 4 refills | Status: DC

## 2022-01-10 ENCOUNTER — Other Ambulatory Visit: Payer: Self-pay | Admitting: Nurse Practitioner

## 2022-01-10 NOTE — Telephone Encounter (Signed)
Requested Prescriptions  Pending Prescriptions Disp Refills  . Lancets (ONETOUCH ULTRASOFT) lancets [Pharmacy Med Name: ONETOUCH ULTRA LAN MIS] 100 each 8    Sig: USE TO [place]     Endocrinology: Diabetes - Testing Supplies Passed - 01/10/2022  9:35 AM      Passed - Valid encounter within last 12 months    Recent Outpatient Visits          3 weeks ago Hypertension associated with diabetes ([place])   [place], Jolene T, NP   1 month ago Hypertension associated with diabetes ([place])   [place], Jolene T, NP   2 months ago Persistent atrial fibrillation ([place])   [place], Jolene T, NP   2 months ago Uncontrolled type 2 diabetes mellitus with hyperglycemia, without long-term current use of insulin ([place])   [place], Jolene T, NP   3 months ago Uncontrolled type 2 diabetes mellitus with hyperglycemia, without long-term current use of insulin ([place])   [place], [name], NP      Future Appointments            In 3 days [name], [name], NP [employer]

## 2022-01-11 NOTE — Patient Instructions (Signed)

## 2022-01-13 ENCOUNTER — Ambulatory Visit (INDEPENDENT_AMBULATORY_CARE_PROVIDER_SITE_OTHER): Payer: PPO | Admitting: Nurse Practitioner

## 2022-01-13 ENCOUNTER — Encounter: Payer: Self-pay | Admitting: Nurse Practitioner

## 2022-01-13 ENCOUNTER — Other Ambulatory Visit: Payer: Self-pay

## 2022-01-13 VITALS — BP 115/68 | HR 62 | Temp 97.6°F | Ht 69.0 in | Wt 205.0 lb

## 2022-01-13 DIAGNOSIS — R569 Unspecified convulsions: Secondary | ICD-10-CM | POA: Diagnosis not present

## 2022-01-13 DIAGNOSIS — I700 Atherosclerosis of aorta: Secondary | ICD-10-CM

## 2022-01-13 DIAGNOSIS — R4701 Aphasia: Secondary | ICD-10-CM

## 2022-01-13 DIAGNOSIS — I152 Hypertension secondary to endocrine disorders: Secondary | ICD-10-CM

## 2022-01-13 DIAGNOSIS — J4520 Mild intermittent asthma, uncomplicated: Secondary | ICD-10-CM

## 2022-01-13 DIAGNOSIS — D692 Other nonthrombocytopenic purpura: Secondary | ICD-10-CM | POA: Insufficient documentation

## 2022-01-13 DIAGNOSIS — G4733 Obstructive sleep apnea (adult) (pediatric): Secondary | ICD-10-CM | POA: Diagnosis not present

## 2022-01-13 DIAGNOSIS — E1169 Type 2 diabetes mellitus with other specified complication: Secondary | ICD-10-CM

## 2022-01-13 DIAGNOSIS — Z8673 Personal history of transient ischemic attack (TIA), and cerebral infarction without residual deficits: Secondary | ICD-10-CM | POA: Diagnosis not present

## 2022-01-13 DIAGNOSIS — E785 Hyperlipidemia, unspecified: Secondary | ICD-10-CM

## 2022-01-13 DIAGNOSIS — E1165 Type 2 diabetes mellitus with hyperglycemia: Secondary | ICD-10-CM

## 2022-01-13 DIAGNOSIS — H353211 Exudative age-related macular degeneration, right eye, with active choroidal neovascularization: Secondary | ICD-10-CM | POA: Diagnosis not present

## 2022-01-13 DIAGNOSIS — D696 Thrombocytopenia, unspecified: Secondary | ICD-10-CM

## 2022-01-13 DIAGNOSIS — I4819 Other persistent atrial fibrillation: Secondary | ICD-10-CM

## 2022-01-13 DIAGNOSIS — E1159 Type 2 diabetes mellitus with other circulatory complications: Secondary | ICD-10-CM | POA: Diagnosis not present

## 2022-01-13 LAB — BAYER DCA HB A1C WAIVED: HB A1C (BAYER DCA - WAIVED): 7 % — ABNORMAL HIGH (ref 4.8–5.6)

## 2022-01-13 MED ORDER — SODIUM CHLORIDE 0.9 % IV SOLN: INTRAVENOUS | Status: AC

## 2022-01-13 NOTE — Assessment & Plan Note (Signed)
Noted on imaging 07/11/20 and previous imaging.  Recommend continue daily statin therapy and ASA for prevention.

## 2022-01-13 NOTE — Assessment & Plan Note (Signed)
Chronic, stable with 100% use of CPAP.  Praised for this. 

## 2022-01-13 NOTE — Assessment & Plan Note (Signed)
Chronic, ongoing post CVA.  Consider speech therapy is needed in future.

## 2022-01-13 NOTE — Assessment & Plan Note (Signed)
Chronic, ongoing with cardioversion on 07/04/21.  Continue collaboration with cardiology.  At this time maintain off Eliquis or ASA due to fall risk and recent injuries.

## 2022-01-13 NOTE — Assessment & Plan Note (Signed)
Chronic, ongoing with BP below neuro goal <130/80.  Recommend he monitor BP at home daily and document.  Will continue Telmisartan 40 MG daily only.  Continue focus on DASH diet and regular exercise at home.  CBC and BMP today.  Recommend he wear compression hose at home to avoid syncopal episodes and orthostatic BP.

## 2022-01-13 NOTE — Assessment & Plan Note (Signed)
Noted on past labs, check CBC today.

## 2022-01-13 NOTE — Assessment & Plan Note (Signed)
Chronic, ongoing.  Continue tight lipid control, may need to increase Atorvastatin to 80 MG if ever elevation, recent LDL in 40's.  Continue current Atorvastatin dosing.  Lipid check next visit.

## 2022-01-13 NOTE — Assessment & Plan Note (Signed)
Being followed by neurology.  Continue current Keppra dose as prescribed by them and collaboration with neurology, reviewed notes.

## 2022-01-13 NOTE — Assessment & Plan Note (Signed)
Chronic, ongoing.  Stable at this time.  Continue current medication regimen and collaboration with pulmonary as needed. 

## 2022-01-13 NOTE — Assessment & Plan Note (Signed)
Continue collaboration with neurology due to significant stroke history.  Continue current medication regimen. 

## 2022-01-13 NOTE — Assessment & Plan Note (Signed)
Noted on exam, continue to monitor and recommend gentle skin care at home + notify provider if skin breakdown presents.

## 2022-01-13 NOTE — Progress Notes (Signed)
BP 115/68   Pulse 62   Temp 97.6 F (36.4 C) (Oral)   Ht '5\' 9"'$  (1.753 m)   Wt 205 lb (93 kg)   SpO2 96%   BMI 30.27 kg/m    Subjective:    Patient ID: Andrew Cobb, male    DOB: 1950-08-17, 71 y.o.   MRN: [id number]  HPI: Andrew Cobb is a 71 y.o. male  Chief Complaint  Patient presents with   Diabetes    Patient denies having a recent Diabetic Eye Exam.    Hyperlipidemia   Hypertension   Sleep Apnea   Seizures   Asthma   Atrial Fibrillation   Procedure    Patient says within the next 2-3 weeks he will be having the "Watchmen's Procedure" to look out for Stroke and he was told the procedure is 98% effective.    DIABETES Last A1c in 7.8%.  He continues on Metformin 1000 MG BID and Jardiance 10 MG -- gets assistance with Jardiance. 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  Continues to work-out often during week.   Followed by ophthalmology for macular degeneration and gets injections -- saw last 04/18/21. Hypoglycemic episodes:no Polydipsia/polyuria: no Visual disturbance: no Chest pain: no Paresthesias: no Glucose Monitoring: yes  Accucheck frequency: Daily  Fasting glucose: 110 to 200  Post prandial:  Evening:  Before meals: Taking Insulin?: no  Long acting insulin:  Short acting insulin: Blood Pressure Monitoring: daily Retinal Examination: Up to Date continues to go for injections with Dr. Zadie Rhine, last 12/19/21 for macular degeneration Foot Exam: Up to Date Pneumovax: Up to Date Influenza: Up to Date Aspirin: no   HYPERTENSION / HYPERLIPIDEMIA Continues Telmisartan 40 MG and Atorvastatin 40 MG daily.  Was noted to have aortic atherosclerosis on imaging 07/11/20.  Had cardioversion on 07/04/21. Currently scheduled to obtain Watchmen in upcoming weeks. Last saw cardiology 12/11/21.  Followed by neurology due to history of stroke events x 3, last saw neurology 07/10/21. Has loop recorder in place.  CVA caused some mild expressive aphasia.  Takes  Keppra for history of seizure like activity post CVAs.   Continues to use CPAP nightly, 100% of the time -- has had for 23 years. Satisfied with current treatment? yes Duration of hypertension: chronic BP monitoring frequency: daily BP range: 110-120/60 range occasionally a little higher BP medication side effects: no Duration of hyperlipidemia: chronic Cholesterol medication side effects: no Cholesterol supplements: none Medication compliance: good compliance Aspirin: no Recent stressors: no Recurrent headaches: no Visual changes: no Palpitations: no Dyspnea: no Chest pain: no Lower extremity edema: no Dizzy/lightheaded: no   ATRIAL FIBRILLATION Followed by cardiology. Atrial fibrillation status: stable Satisfied with current treatment: yes  Medication side effects:  no Medication compliance: good compliance Etiology of atrial fibrillation:  Palpitations:  no Chest pain:  no Dyspnea on exertion:  no Orthopnea:  no Syncope:  no Edema:  no Ventricular rate control: Not indicated Anti-coagulation: not indicated   ANEMIA Improving on recent labs.  Continues on B12 and multivitamin at home. Anemia status: stable Etiology of anemia: unknown Duration of anemia treatment: months Compliance with treatment: good compliance Iron supplementation side effects: no Severity of anemia: mild Fatigue: no Decreased exercise tolerance: no  Dyspnea on exertion: no Palpitations: no Bleeding: no Pica: no   ASTHMA Asthma status: stable Satisfied with current treatment?: yes Albuterol/rescue inhaler frequency: every other day, often at night Dyspnea frequency: none Wheezing frequency: none Cough frequency: none Nocturnal symptom frequency: none  Limitation of activity: no Current upper respiratory symptoms: no Failed/intolerant to following asthma meds:  Asthma meds in past: as above Aerochamber/spacer use: no Visits to ER or Urgent Care in past year: no Pneumovax: Up to  Date Influenza: Up to Date   Relevant past medical, surgical, family and social history reviewed and updated as indicated. Interim medical history since our last visit reviewed. Allergies and medications reviewed and updated.  Review of Systems  Constitutional:  Negative for activity change, diaphoresis, fatigue and fever.  Respiratory:  Negative for cough, chest tightness, shortness of breath and wheezing.   Cardiovascular:  Negative for chest pain, palpitations and leg swelling.  Gastrointestinal: Negative.   Neurological: Negative.   Psychiatric/Behavioral: Negative.      Per HPI unless specifically indicated above     Objective:    BP 115/68   Pulse 62   Temp 97.6 F (36.4 C) (Oral)   Ht '5\' 9"'$  (1.753 m)   Wt 205 lb (93 kg)   SpO2 96%   BMI 30.27 kg/m   Wt Readings from Last 3 Encounters:  01/13/22 205 lb (93 kg)  12/17/21 202 lb 12.8 oz (92 kg)  12/11/21 201 lb (91.2 kg)    Physical Exam Vitals and nursing note reviewed.  Constitutional:      General: He is awake. He is not in acute distress.    Appearance: He is well-developed and well-groomed. He is not ill-appearing.  HENT:     Head: Normocephalic and atraumatic.     Right Ear: Hearing normal. No drainage.     Left Ear: Hearing normal. No drainage.  Eyes:     General: Lids are normal.        Right eye: No discharge.        Left eye: No discharge.     Conjunctiva/sclera: Conjunctivae normal.     Pupils: Pupils are equal, round, and reactive to light.  Neck:     Thyroid: No thyromegaly.     Vascular: No carotid bruit or JVD.     Trachea: Trachea normal.  Cardiovascular:     Rate and Rhythm: Normal rate and regular rhythm.     Heart sounds: Normal heart sounds, S1 normal and S2 normal. No murmur heard.    No gallop.  Pulmonary:     Effort: Pulmonary effort is normal. No accessory muscle usage or respiratory distress.     Breath sounds: Normal breath sounds.  Abdominal:     General: Bowel sounds are  normal.     Palpations: Abdomen is soft. There is no hepatomegaly or splenomegaly.  Musculoskeletal:        General: Normal range of motion.     Cervical back: Normal range of motion and neck supple.     Right lower leg: No edema.     Left lower leg: No edema.  Skin:    General: Skin is warm and dry.     Capillary Refill: Capillary refill takes less than 2 seconds.     Findings: Bruising present.     Comments: Bilateral bruises scattered to upper extremities.  Neurological:     Mental Status: He is alert and oriented to person, place, and time.     Deep Tendon Reflexes: Reflexes are normal and symmetric.  Psychiatric:        Attention and Perception: Attention normal.        Mood and Affect: Mood normal.        Speech: Speech normal.  Behavior: Behavior normal. Behavior is cooperative.        Thought Content: Thought content normal.    Results for orders placed or performed in visit on 01/13/22  Bayer DCA Hb A1c Waived  Result Value Ref Range   HB A1C (BAYER DCA - WAIVED) 7.0 (H) 4.8 - 5.6 %      Assessment & Plan:   Problem List Items Addressed This Visit       Cardiovascular and Mediastinum   Aortic atherosclerosis ([place])    Noted on imaging 07/11/20 and previous imaging.  Recommend continue daily statin therapy and ASA for prevention.      Relevant Orders   Basic metabolic panel   Exudative age-related macular degeneration of right eye with active choroidal neovascularization (HCC)    Chronic, ongoing, followed by opthalmology.  Continue this collaboration.      Hypertension associated with diabetes ([place])    Chronic, ongoing with BP below neuro goal <130/80.  Recommend he monitor BP at home daily and document.  Will continue Telmisartan 40 MG daily only.  Continue focus on DASH diet and regular exercise at home.  CBC and BMP today.  Recommend he wear compression hose at home to avoid syncopal episodes and orthostatic BP.        Relevant Orders   Bayer DCA Hb  A1c Waived (Completed)   CBC with Differential/Platelet   Persistent atrial fibrillation (HCC)    Chronic, ongoing with cardioversion on 07/04/21.  Continue collaboration with cardiology.  At this time maintain off Eliquis or ASA due to fall risk and recent injuries.       Relevant Orders   Basic metabolic panel   CBC with Differential/Platelet   Senile purpura ([place])    Noted on exam, continue to monitor and recommend gentle skin care at home + notify provider if skin breakdown presents.        Respiratory   Asthma    Chronic, ongoing.  Stable at this time.  Continue current medication regimen and collaboration with pulmonary as needed.      OSA (obstructive sleep apnea)    Chronic, stable with 100% use of CPAP.  Praised for this.      Relevant Orders   CBC with Differential/Platelet     Endocrine   Hyperlipidemia associated with type 2 diabetes mellitus (HCC)    Chronic, ongoing.  Continue tight lipid control, may need to increase Atorvastatin to 80 MG if ever elevation, recent LDL in 40's.  Continue current Atorvastatin dosing.  Lipid check next visit.      Relevant Orders   Bayer DCA Hb A1c Waived (Completed)   Uncontrolled type 2 diabetes mellitus with hyperglycemia, without long-term current use of insulin (HCC) - Primary    Chronic, ongoing with A1c 7%, trend down, plus urine ALB 30 and A:C <03 November 2021.  Continue Telmisartan for kidney protection. Neuro goal <6.5%, recommend he take medication daily and focus heavily on diet -- as often at goal.  Continue  Metformin 1000 MG BID and Jardiance 25 MG - obtains via assistance.  May need to consider GLP1 next visit if elevations.  Recommend he check BS twice daily + continue focus on regular exercise and diet.  Return in 3 months.      Relevant Orders   Bayer DCA Hb A1c Waived (Completed)   CBC with Differential/Platelet     Hematopoietic and Hemostatic   Thrombocytopenia ([place])    Noted on past labs, check CBC today.  Relevant Orders   Bayer DCA Hb A1c Waived (Completed)     Other   Expressive aphasia    Chronic, ongoing post CVA.  Consider speech therapy is needed in future.      History of stroke    Continue collaboration with neurology due to significant stroke history.  Continue current medication regimen.      Relevant Orders   Basic metabolic panel   Seizures ([place])    Being followed by neurology.  Continue current Keppra dose as prescribed by them and collaboration with neurology, reviewed notes.        Relevant Orders   Basic metabolic panel   CBC with Differential/Platelet     Follow up plan: Return in about 3 months (around 04/15/2022) for T2DM, HTN/HLD, BPH, Vit D, SEIZURES, A-FIB.

## 2022-01-13 NOTE — Assessment & Plan Note (Signed)
Chronic, ongoing with A1c 7%, trend down, plus urine ALB 30 and A:C <03 November 2021.  Continue Telmisartan for kidney protection. Neuro goal <6.5%, recommend he take medication daily and focus heavily on diet -- as often at goal.  Continue  Metformin 1000 MG BID and Jardiance 25 MG - obtains via assistance.  May need to consider GLP1 next visit if elevations.  Recommend he check BS twice daily + continue focus on regular exercise and diet.  Return in 3 months.

## 2022-01-13 NOTE — Assessment & Plan Note (Signed)
Chronic, ongoing, followed by opthalmology.  Continue this collaboration. 

## 2022-01-14 LAB — CBC WITH DIFFERENTIAL/PLATELET
Basophils Absolute: 0 10*3/uL (ref 0.0–0.2)
Basos: 1 %
EOS (ABSOLUTE): 0.1 10*3/uL (ref 0.0–0.4)
Eos: 2 %
Hematocrit: 42.8 % (ref 37.5–51.0)
Hemoglobin: 14 g/dL (ref 13.0–17.7)
Immature Grans (Abs): 0 10*3/uL (ref 0.0–0.1)
Immature Granulocytes: 0 %
Lymphocytes Absolute: 0.9 10*3/uL (ref 0.7–3.1)
Lymphs: 21 %
MCH: 30.2 pg (ref 26.6–33.0)
MCHC: 32.7 g/dL (ref 31.5–35.7)
MCV: 92 fL (ref 79–97)
Monocytes Absolute: 0.4 10*3/uL (ref 0.1–0.9)
Monocytes: 9 %
Neutrophils Absolute: 2.9 10*3/uL (ref 1.4–7.0)
Neutrophils: 67 %
Platelets: 137 10*3/uL — ABNORMAL LOW (ref 150–450)
RBC: 4.63 x10E6/uL (ref 4.14–5.80)
RDW: 12 % (ref 11.6–15.4)
WBC: 4.2 10*3/uL (ref 3.4–10.8)

## 2022-01-14 LAB — BASIC METABOLIC PANEL
BUN/Creatinine Ratio: 21 (ref 10–24)
BUN: 26 mg/dL (ref 8–27)
CO2: 21 mmol/L (ref 20–29)
Calcium: 9 mg/dL (ref 8.6–10.2)
Chloride: 101 mmol/L (ref 96–106)
Creatinine, Ser: 1.24 mg/dL (ref 0.76–1.27)
Glucose: 211 mg/dL — ABNORMAL HIGH (ref 70–99)
Potassium: 4.4 mmol/L (ref 3.5–5.2)
Sodium: 139 mmol/L (ref 134–144)
eGFR: 63 mL/min/{1.73_m2} (ref >59)

## 2022-01-14 NOTE — Progress Notes (Signed)
Contacted via MyChart   Good evening Giovanni, your labs have returned: - Kidney function, creatinine and eGFR, remains normal.  Glucose, sugar, was a little elevated, we will continue to monitor this. - CBC shows normal hemoglobin and hematocrit, improved anemia.  Great news!!  Platelet count is a little low this check, it has been before at times.  We will monitor this closely to ensure no worsening, these help your blood clot.  Any questions? Keep being stellar!!  Thank you for allowing me to participate in your care.  I appreciate you. Kindest regards, Destin Ducre

## 2022-01-21 ENCOUNTER — Ambulatory Visit (HOSPITAL_COMMUNITY)
Admission: RE | Admit: 2022-01-21 | Discharge: 2022-01-21 | Disposition: A | Discharge status: Home / Self Care | Payer: PPO | Source: Ambulatory Visit | Attending: Physician Assistant | Admitting: Physician Assistant

## 2022-01-21 ENCOUNTER — Encounter (HOSPITAL_COMMUNITY): Payer: Self-pay | Admitting: Physician Assistant

## 2022-01-21 VITALS — BP 148/88 | HR 58 | Ht 69.0 in | Wt 204.1 lb

## 2022-01-21 DIAGNOSIS — R296 Repeated falls: Secondary | ICD-10-CM | POA: Diagnosis not present

## 2022-01-21 DIAGNOSIS — D6869 Other thrombophilia: Secondary | ICD-10-CM | POA: Insufficient documentation

## 2022-01-21 DIAGNOSIS — I10 Essential (primary) hypertension: Secondary | ICD-10-CM | POA: Diagnosis not present

## 2022-01-21 DIAGNOSIS — Z8673 Personal history of transient ischemic attack (TIA), and cerebral infarction without residual deficits: Secondary | ICD-10-CM | POA: Insufficient documentation

## 2022-01-21 DIAGNOSIS — E119 Type 2 diabetes mellitus without complications: Secondary | ICD-10-CM | POA: Insufficient documentation

## 2022-01-21 DIAGNOSIS — G4733 Obstructive sleep apnea (adult) (pediatric): Secondary | ICD-10-CM | POA: Diagnosis not present

## 2022-01-21 DIAGNOSIS — I4819 Other persistent atrial fibrillation: Secondary | ICD-10-CM | POA: Diagnosis not present

## 2022-01-21 DIAGNOSIS — I4892 Unspecified atrial flutter: Secondary | ICD-10-CM | POA: Insufficient documentation

## 2022-01-21 DIAGNOSIS — Z6830 Body mass index (BMI) 30.0-30.9, adult: Secondary | ICD-10-CM | POA: Diagnosis not present

## 2022-01-21 DIAGNOSIS — Z8616 Personal history of COVID-19: Secondary | ICD-10-CM | POA: Insufficient documentation

## 2022-01-21 DIAGNOSIS — E669 Obesity, unspecified: Secondary | ICD-10-CM | POA: Diagnosis not present

## 2022-01-21 DIAGNOSIS — Z8249 Family history of ischemic heart disease and other diseases of the circulatory system: Secondary | ICD-10-CM | POA: Diagnosis not present

## 2022-01-21 DIAGNOSIS — I2510 Atherosclerotic heart disease of native coronary artery without angina pectoris: Secondary | ICD-10-CM | POA: Insufficient documentation

## 2022-01-21 NOTE — Progress Notes (Signed)
Primary Care Physician: [name], NP Primary Cardiologist: Dr Josefa Half Primary Electrophysiologist: Dr Lovena Le Referring Physician: Dr [facility]   Andrew Cobb is a 71 y.o. male with a history of CAD, DM, OSA, HTN, prior CVA, atrial fibrillation who presents for follow up in the [facility].  The patient was initially diagnosed with atrial fibrillation on ILR 05/07/21 which was implanted for cryptogenic CVA in 09/2018. Patient has a CHADS2VASC score of 6. He was unaware of his arrhythmia. Of note, he was also diagnosed with COVID around the same time. He reports good compliance with his CPAP machine. He denies significant alcohol use. Patient is s/p DCCV on 07/04/21.   On follow up today, patient was hospitalized in March after a fall complicated by left hip hematoma.  His hemoglobin dropped 4 points due to significant blood loss in the soft tissues. His Eliquis was discontinued and he has seen Dr Quentin Ore to discuss Watchman. He remains in SR today. No recent falls.   Today, he denies symptoms of palpitations, chest pain, shortness of breath, orthopnea, PND, lower extremity edema, dizziness, presyncope, syncope, snoring, daytime somnolence, bleeding, or neurologic sequela. The patient is tolerating medications without difficulties and is otherwise without complaint today.    Atrial Fibrillation Risk Factors:  he does have symptoms or diagnosis of sleep apnea. he is compliant with CPAP therapy.  he does not have a history of rheumatic fever. he does not have a history of alcohol use. The patient does have a history of early familial atrial fibrillation or other arrhythmias. Father and brother have afib.  he has a BMI of Body mass index is 30.14 kg/m.[name] Kitchen Filed Weights   01/21/22 1026  Weight: 92.6 kg    Family History  Problem Relation Age of Onset   Cancer Mother        pancreatic   Diabetes Mother    Stroke Mother    Heart disease  Mother    Hyperlipidemia Mother    Hypertension Mother    Heart attack Mother    Heart disease Father    Stroke Father    Diabetes Father    Hypertension Father    Heart attack Father    Hyperlipidemia Father    Pancreatic cancer Father    Cancer Sister    Cancer Brother        lung   Cancer Brother    Kidney cancer Neg Hx    Bladder Cancer Neg Hx    Prostate cancer Neg Hx      Atrial Fibrillation Management history:  Previous antiarrhythmic drugs: none Previous cardioversions: 07/04/21 Previous ablations: none CHADS2VASC score: 6 Anticoagulation history: Eliquis   Past Medical History:  Diagnosis Date   Arthritis    Asthma    has not needed since 6/21   Cancer [facility])    prostate   Chronic venous insufficiency    varicose vein lower extremity with inflammation   Coronary artery disease 1996   two stents placed    Diabetes mellitus without complication ([place])    type 2 on metformin   GERD (gastroesophageal reflux disease)    no issues since gastric bypass surgery as stated per pt   Hyperlipidemia    Hypertension    Hypogonadism in male    MRSA (methicillin resistant Staphylococcus aureus) infection    07/30/2008 thru 08/07/2008  abdominal abcess   Sleep apnea    on BIPAP   Stented coronary artery    Stroke (  [place]) 09/07/2019   no defecits   Thrombocythemia    Past Surgical History:  Procedure Laterality Date   ANGIOPLASTY     ANGIOPLASTY     with stent 04/07/1995   ANTERIOR CERVICAL DECOMPRESSION/DISCECTOMY FUSION 4 LEVELS N/A 08/17/2017   Procedure: Anterior discectomy with fusion and plate fixation Cervical Three-Four, Four-Five, Five-Six, and Six-Seven Fusion;  Surgeon: Ditty, Kevan Ny, MD;  Location: [facility];  Service: Neurosurgery;  Laterality: N/A;  Anterior discectomy with fusion and plate fixation Cervical Three-Four, Four-Five, Five-Six, and Six-Seven Fusion    APPENDECTOMY     1966   BUBBLE STUDY  09/20/2018   Procedure: BUBBLE STUDY;  Surgeon:  Josue Hector, MD;  Location: [facility] ENDOSCOPY;  Service: Cardiovascular;;   [place] TEST     07/31/2011   CARDIOVERSION N/A 07/04/2021   Procedure: CARDIOVERSION;  Surgeon: [name], MD;  Location: [facility] ENDOSCOPY;  Service: Cardiovascular;  Laterality: N/A;   CARPAL TUNNEL RELEASE Left    CHOLECYSTECTOMY     2006   COLONOSCOPY WITH PROPOFOL N/A 03/26/2021   Procedure: COLONOSCOPY WITH PROPOFOL;  Surgeon: Jonathon Bellows, MD;  Location: [facility] ENDOSCOPY;  Service: Gastroenterology;  Laterality: N/A;   colonscopy      08/25/2012   EYE SURGERY Bilateral    cataract   FUNCTIONAL ENDOSCOPIC SINUS SURGERY     11/10/2013   GASTRIC BYPASS     10/05/2012   HERNIA REPAIR     left inguinal 1981   INCISION AND DRAINAGE ABSCESS Right 02/26/2017   Procedure: INCISION AND DRAINAGE ABSCESS;  Surgeon: Nickie Retort, MD;  Location: [facility] ORS;  Service: Urology;  Laterality: Right;   JOINT REPLACEMENT     bilateral   left ankle surgery      05/03/2003    left carpel tunnel      09/18/1993   left knee meniscal tear      01/25/2010   left knee meniscal tear repair      05/04/1996   left rotator cuff repair      05/03/2003    LOOP RECORDER INSERTION N/A 09/20/2018   Procedure: LOOP RECORDER INSERTION;  Surgeon: Evans Lance, MD;  Location: [facility] CV LAB;  Service: Cardiovascular;  Laterality: N/A;   REPLACEMENT TOTAL KNEE BILATERAL  07/13/2015   REVERSE SHOULDER ARTHROPLASTY Left 08/30/2020   Procedure: REVERSE SHOULDER ARTHROPLASTY;  Surgeon: Justice Britain, MD;  Location: [facility] ORS;  Service: Orthopedics;  Laterality: Left;  150mn   right ankle surgery      fracture has 2 screws 07/07/1997   right carpel tunnel      05/16/1992   right shoulder replacement      01/27/2006   SCROTAL EXPLORATION Right 02/26/2017   Procedure: SCROTUM EXPLORATION;  Surgeon: BNickie Retort MD;  Location: [facility] ORS;  Service: Urology;  Laterality: Right;   TEE  WITHOUT CARDIOVERSION N/A 09/20/2018   Procedure: TRANSESOPHAGEAL ECHOCARDIOGRAM (TEE);  Surgeon: NJosue Hector MD;  [facility];  Service: Cardiovascular;  Laterality: N/A;   TOTAL KNEE ARTHROPLASTY Left 07/13/2015   Procedure: LEFT TOTAL KNEE ARTHROPLASTY;  Surgeon: FGaynelle Arabian MD;  Location: [facility] ORS;  Service: Orthopedics;  Laterality: Left;    Current Outpatient Medications  Medication Sig Dispense Refill   acyclovir (ZOVIRAX) 400 MG tablet Take 1 tablet (400 mg total) by mouth 2 (two) times daily. 180 tablet 4   albuterol (VENTOLIN HFA) 108 (90 Base) MCG/ACT inhaler Inhale 2 puffs into the  lungs every 6 (six) hours as needed for wheezing or shortness of breath. 18 g 4   amitriptyline (ELAVIL) 25 MG tablet Take 1 tablet (25 mg total) by mouth at bedtime. 90 tablet 4   atorvastatin (LIPITOR) 40 MG tablet Take 1 tablet (40 mg total) by mouth at bedtime. 90 tablet 4   cetirizine (ZYRTEC) 10 MG tablet Take 10 mg by mouth daily.     Cholecalciferol (VITAMIN D) 2000 units CAPS Take 2,000 Units by mouth daily.     Cyanocobalamin (B-12) 5000 MCG CAPS Take 5,000 mcg by mouth every other day.     empagliflozin (JARDIANCE) 25 MG TABS tablet Take by mouth daily.     fluticasone (FLONASE) 50 MCG/ACT nasal spray Place 2 sprays into both nostrils daily. 48 g 4   glucose blood (ONETOUCH ULTRA) test strip 1 each by Other route 2 (two) times daily. Use as instructed 100 each 6   Lancets (ONETOUCH ULTRASOFT) lancets USE TO CHECK BLOOD SUAGR TWICE DAILY 100 each 8   levETIRAcetam (KEPPRA) 500 MG tablet Take 1 tablet (500 mg total) by mouth 2 (two) times daily. 180 tablet 3   metFORMIN (GLUCOPHAGE) 1000 MG tablet Take 1 tablet (1,000 mg total) by mouth 2 (two) times daily with a meal. 180 tablet 4   Multiple Vitamins-Minerals (MULTIVITAMIN PO) Take 1 tablet by mouth daily.      tamsulosin (FLOMAX) 0.4 MG CAPS capsule Take 1 capsule (0.4 mg total) by mouth at bedtime. 90 capsule 4   telmisartan  (MICARDIS) 40 MG tablet Take 1 tablet (40 mg total) by mouth daily. 90 tablet 4   Current Facility-Administered Medications  Medication Dose Route Frequency Provider Last Rate Last Admin   0.9 %  sodium chloride infusion   Intravenous Continuous Vickie Epley, MD       0.9 %  sodium chloride infusion   Intravenous Continuous Vickie Epley, MD        Allergies  Allergen Reactions   Other Rash   Succinylsulphathiazole Rash   Sulfamethoxazole-Trimethoprim Rash   Tetracyclines & Related Rash    Social History   Socioeconomic History   Marital status: Married    Spouse name: Not on file   Number of children: Not on file   Years of education: Not on file   Highest education level: Some college, no degree  Occupational History    Comment: drives for [employer]   Tobacco Use   Smoking status: Former    Packs/day: 1.00    Years: 10.00    Total pack years: 10.00    Types: Cigarettes    Quit date: 07/07/1984    Years since quitting: 37.5   Smokeless tobacco: Never   Tobacco comments:    Former Smoker quit 1986 07/23/2021  Vaping Use   Vaping Use: Never used  Substance and Sexual Activity   Alcohol use: No    Alcohol/week: 0.0 standard drinks of alcohol   Drug use: No   Sexual activity: Yes  Other Topics Concern   Not on file  Social History Narrative   Not on file   Social Determinants of Health   Financial Resource Strain: Low Risk  (07/24/2021)   Overall Financial Resource Strain (CARDIA)    Difficulty of Paying Living Expenses: Not hard at all  Food Insecurity: No Food Insecurity (07/24/2021)   Hunger Vital Sign    Worried About Running Out of Food in the Last Year: Never true    Ran Out of Food  in the Last Year: Never true  Transportation Needs: No Transportation Needs (07/24/2021)   PRAPARE - Hydrologist (Medical): No    Lack of Transportation (Non-Medical): No  Physical Activity: Sufficiently Active (07/24/2021)   Exercise Vital  Sign    Days of Exercise per Week: 4 days    Minutes of Exercise per Session: 50 min  Stress: No Stress Concern Present (07/24/2021)   [place]    Feeling of Stress : Not at all  Social Connections: [place] (07/24/2021)   Social Connection and Isolation Panel [NHANES]    Frequency of Communication with Friends and Family: Twice a week    Frequency of Social Gatherings with Friends and Family: More than three times a week    Attends Religious Services: More than 4 times per year    Active Member of [employer] or Organizations: Yes    Attends Music therapist: More than 4 times per year    Marital Status: Married  Human resources officer Violence: Not At Risk (07/24/2021)   Humiliation, Afraid, Rape, and Kick questionnaire    Fear of Current or Ex-Partner: No    Emotionally Abused: No    Physically Abused: No    Sexually Abused: No     ROS- All systems are reviewed and negative except as per the HPI above.  Physical Exam: Vitals:   01/21/22 1026  BP: (!) 148/88  Pulse: (!) 58  Weight: 92.6 kg  Height: '5\' 9"'$  (1.753 m)     GEN- The patient is a well appearing obese male, alert and oriented x 3 today.   HEENT-head normocephalic, atraumatic, sclera clear, conjunctiva pink, hearing intact, trachea midline. Lungs- Clear to ausculation bilaterally, normal work of breathing Heart- Regular rate and rhythm, no murmurs, rubs or gallops  GI- soft, NT, ND, + BS Extremities- no clubbing, cyanosis, or edema MS- no significant deformity or atrophy Skin- no rash or lesion Psych- euthymic mood, full affect Neuro- strength and sensation are intact   Wt Readings from Last 3 Encounters:  01/21/22 92.6 kg  01/13/22 93 kg  12/17/21 92 kg    EKG today demonstrates  SB, 1st degree AV block Vent. rate 58 BPM PR interval 246 ms QRS duration 96 ms QT/QTcB 438/429 ms  Echo 09/19/18 demonstrated  1. The left  ventricle has normal systolic function with an ejection  fraction of 60-65%. The cavity size was normal. There is mild concentric left ventricular hypertrophy. Left ventricular diastolic Doppler parameters are consistent with impaired relaxation.   2. The mitral valve is grossly normal.   3. The tricuspid valve is grossly normal.   4. The aortic valve is tricuspid Aortic valve regurgitation is trivial by  color flow Doppler. Moderate annular calcification seen.   5. The right ventricle has normal systolic function. The cavity was  normal. There is no increase in right ventricular wall thickness.   Epic records are reviewed at length today  CHA2DS2-VASc Score = 6  The patient's score is based upon: CHF History: 0 HTN History: 1 Diabetes History: 1 Stroke History: 2 Vascular Disease History: 1 Age Score: 1 Gender Score: 0       ASSESSMENT AND PLAN: 1. Persistent Atrial Fibrillation/atrial flutter The patient's CHA2DS2-VASc score is 6, indicating a 9.7% annual risk of stroke.   Patient appears to be maintaining SR. Not currently on anticoagulation due to bleeding risk. Undergoing workup for Watchman implant.   2.  Secondary Hypercoagulable State (ICD10:  D68.69) The patient is at significant risk for stroke/thromboembolism based upon his CHA2DS2-VASc Score of 6.   Patient not currently on anticoagulation due to bleeding risk with frequent falls. Scheduled for Watchman implant 02/2022.    3. Obesity Body mass index is 30.14 kg/m. Lifestyle modification was discussed and encouraged including regular physical activity and weight reduction.  4. Obstructive sleep apnea Encouraged compliance with CPAP therapy.  5. CAD No anginal symptoms.  6. HTN Stable, no changes today.   Follow up with structural heart team for Watchman.   [facility] [address] [place], [place] [address] [phone] 01/21/2022 10:41 AM

## 2022-01-23 NOTE — Progress Notes (Signed)
Carelink Summary Report / Loop Recorder 

## 2022-01-27 ENCOUNTER — Telehealth: Payer: Self-pay

## 2022-01-27 ENCOUNTER — Telehealth: Payer: Self-pay | Admitting: Adult Health

## 2022-01-27 ENCOUNTER — Telehealth: Payer: PPO

## 2022-01-27 NOTE — Telephone Encounter (Signed)
Pt's wife, Espn Zeman sent a PA sent to [facility] for CPAP.  Would like a call from the nurse.

## 2022-01-27 NOTE — Telephone Encounter (Signed)
I have reached out to [facility] on this.  

## 2022-01-27 NOTE — Telephone Encounter (Signed)
  Care Management   Follow Up Note   01/27/2022 Name: Andrew Cobb MRN: [id number] DOB: [birth year]   Referred by: [name], NP Reason for referral : Chronic Care Management   An unsuccessful telephone outreach was attempted today. The patient was referred to the case management team for assistance with care management and care coordination.   Follow Up Plan: The patient has been provided with contact information for the care management team and has been advised to call with any health related questions or concerns.  -Asked patient to please call back. We need to coordinate PAP for him  [place] Constable, Pharm.D. - [phone]

## 2022-01-28 ENCOUNTER — Other Ambulatory Visit: Payer: Self-pay

## 2022-01-28 MED ORDER — APIXABAN 5 MG PO TABS: 5.0000 mg | ORAL_TABLET | Freq: Two times a day (BID) | ORAL | 0 refills | Status: DC

## 2022-01-29 ENCOUNTER — Other Ambulatory Visit: Payer: Self-pay | Admitting: Cardiology

## 2022-01-29 ENCOUNTER — Telehealth: Payer: Self-pay | Admitting: Cardiology

## 2022-01-29 NOTE — Telephone Encounter (Signed)
Called to speak with the patient about starting Eliquis '5mg'$  BID in anticipation of Watchman implant scheduled for 8/31 with Dr. Quentin Ore. I will see him 8/9 for pre Watchman. [place] updated.   Kathyrn Drown NP-C Structural Heart Team  Pager: [phone] Phone: [phone]

## 2022-01-30 ENCOUNTER — Ambulatory Visit (INDEPENDENT_AMBULATORY_CARE_PROVIDER_SITE_OTHER): Payer: PPO | Admitting: Ophthalmology

## 2022-01-30 ENCOUNTER — Encounter (INDEPENDENT_AMBULATORY_CARE_PROVIDER_SITE_OTHER): Payer: PPO | Admitting: Ophthalmology

## 2022-01-30 ENCOUNTER — Encounter (INDEPENDENT_AMBULATORY_CARE_PROVIDER_SITE_OTHER): Payer: Self-pay | Admitting: Ophthalmology

## 2022-01-30 DIAGNOSIS — H35721 Serous detachment of retinal pigment epithelium, right eye: Secondary | ICD-10-CM

## 2022-01-30 DIAGNOSIS — H353211 Exudative age-related macular degeneration, right eye, with active choroidal neovascularization: Secondary | ICD-10-CM | POA: Diagnosis not present

## 2022-01-30 DIAGNOSIS — H353122 Nonexudative age-related macular degeneration, left eye, intermediate dry stage: Secondary | ICD-10-CM | POA: Diagnosis not present

## 2022-01-30 LAB — CUP PACEART REMOTE DEVICE CHECK
Date Time Interrogation Session: 20230722233545
Implantable Pulse Generator Implant Date: 20200316

## 2022-01-30 MED ORDER — FARICIMAB-SVOA 6 MG/0.05ML IZ SOLN
6.0000 mg | INTRAVITREAL | Status: AC | PRN
  Administered 2022-01-30: 6 mg via INTRAVITREAL

## 2022-01-30 NOTE — Progress Notes (Signed)
01/30/2022     CHIEF COMPLAINT Patient presents for  Chief Complaint  Patient presents with   Macular Degeneration      HISTORY OF PRESENT ILLNESS: Andrew Cobb is a 71 y.o. male who presents to the clinic today for:   HPI   No interval change in vision.  History of wet AMD.  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  6 weeks dilate OD Vabysmo OCT  Pt states his vision has been stable Pt denies any new floaters or FOL   Last edited by Hurman Horn, MD on 01/30/2022  9:14 AM.      Referring physician: [name], NP [place],  [place] [address]  HISTORICAL INFORMATION:   Selected notes from the MEDICAL RECORD NUMBER    Lab Results  Component Value Date   HGBA1C 7.0 (H) 01/13/2022     CURRENT MEDICATIONS: No current outpatient medications on file. (Ophthalmic Drugs)   No current facility-administered medications for this visit. (Ophthalmic Drugs)   Current Outpatient Medications (Other)  Medication Sig   acyclovir (ZOVIRAX) 400 MG tablet Take 1 tablet (400 mg total) by mouth 2 (two) times daily.   albuterol (VENTOLIN HFA) 108 (90 Base) MCG/ACT inhaler Inhale 2 puffs into the lungs every 6 (six) hours as needed for wheezing or shortness of breath.   amitriptyline (ELAVIL) 25 MG tablet Take 1 tablet (25 mg total) by mouth at bedtime.   apixaban (ELIQUIS) 5 MG TABS tablet Take 1 tablet (5 mg total) by mouth 2 (two) times daily.   atorvastatin (LIPITOR) 40 MG tablet Take 1 tablet (40 mg total) by mouth at bedtime.   cetirizine (ZYRTEC) 10 MG tablet Take 10 mg by mouth daily.   Cholecalciferol (VITAMIN D) 2000 units CAPS Take 2,000 Units by mouth daily.   Cyanocobalamin (B-12) 5000 MCG CAPS Take 5,000 mcg by mouth every other day.   empagliflozin (JARDIANCE) 25 MG TABS tablet Take by mouth daily.   fluticasone (FLONASE) 50 MCG/ACT nasal spray Place 2 sprays into both nostrils daily.   glucose blood (ONETOUCH ULTRA) test strip 1 each by  Other route 2 (two) times daily. Use as instructed   Lancets (ONETOUCH ULTRASOFT) lancets USE TO CHECK BLOOD SUAGR TWICE DAILY   levETIRAcetam (KEPPRA) 500 MG tablet Take 1 tablet (500 mg total) by mouth 2 (two) times daily.   metFORMIN (GLUCOPHAGE) 1000 MG tablet Take 1 tablet (1,000 mg total) by mouth 2 (two) times daily with a meal.   Multiple Vitamins-Minerals (MULTIVITAMIN PO) Take 1 tablet by mouth daily.    tamsulosin (FLOMAX) 0.4 MG CAPS capsule Take 1 capsule (0.4 mg total) by mouth at bedtime.   telmisartan (MICARDIS) 40 MG tablet Take 1 tablet (40 mg total) by mouth daily.   Current Facility-Administered Medications (Other)  Medication Route   0.9 %  sodium chloride infusion Intravenous   0.9 %  sodium chloride infusion Intravenous      REVIEW OF SYSTEMS: ROS   Negative for: Constitutional, Gastrointestinal, Neurological, Skin, Genitourinary, Musculoskeletal, HENT, Endocrine, Cardiovascular, Eyes, Respiratory, Psychiatric, Allergic/Imm, Heme/Lymph Last edited by Orene Desanctis D, CMA on 01/30/2022  8:39 AM.       ALLERGIES Allergies  Allergen Reactions   Other Rash   Succinylsulphathiazole Rash   Sulfamethoxazole-Trimethoprim Rash   Tetracyclines & Related Rash    PAST MEDICAL HISTORY Past Medical History:  Diagnosis Date   Arthritis    Asthma    has not needed since  6/21   Cancer ([place])    prostate   Chronic venous insufficiency    varicose vein lower extremity with inflammation   Coronary artery disease 1996   two stents placed    Diabetes mellitus without complication ([place])    type 2 on metformin   GERD (gastroesophageal reflux disease)    no issues since gastric bypass surgery as stated per pt   Hyperlipidemia    Hypertension    Hypogonadism in male    MRSA (methicillin resistant Staphylococcus aureus) infection    07/30/2008 thru 08/07/2008  abdominal abcess   Sleep apnea    on BIPAP   Stented coronary artery    Stroke ([place]) 09/07/2019   no  defecits   Thrombocythemia    Past Surgical History:  Procedure Laterality Date   ANGIOPLASTY     ANGIOPLASTY     with stent 04/07/1995   ANTERIOR CERVICAL DECOMPRESSION/DISCECTOMY FUSION 4 LEVELS N/A 08/17/2017   Procedure: Anterior discectomy with fusion and plate fixation Cervical Three-Four, Four-Five, Five-Six, and Six-Seven Fusion;  Surgeon: Ditty, Kevan Ny, MD;  Location: [facility];  Service: Neurosurgery;  Laterality: N/A;  Anterior discectomy with fusion and plate fixation Cervical Three-Four, Four-Five, Five-Six, and Six-Seven Fusion    APPENDECTOMY     1966   BUBBLE STUDY  09/20/2018   Procedure: BUBBLE STUDY;  Surgeon: Josue Hector, MD;  Location: [facility] ENDOSCOPY;  Service: Cardiovascular;;   [place] TEST     07/31/2011   CARDIOVERSION N/A 07/04/2021   Procedure: CARDIOVERSION;  Surgeon: [name], MD;  Location: [facility] ENDOSCOPY;  Service: Cardiovascular;  Laterality: N/A;   CARPAL TUNNEL RELEASE Left    CHOLECYSTECTOMY     2006   COLONOSCOPY WITH PROPOFOL N/A 03/26/2021   Procedure: COLONOSCOPY WITH PROPOFOL;  Surgeon: Jonathon Bellows, MD;  Location: [facility] ENDOSCOPY;  Service: Gastroenterology;  Laterality: N/A;   colonscopy      08/25/2012   EYE SURGERY Bilateral    cataract   FUNCTIONAL ENDOSCOPIC SINUS SURGERY     11/10/2013   GASTRIC BYPASS     10/05/2012   HERNIA REPAIR     left inguinal 1981   INCISION AND DRAINAGE ABSCESS Right 02/26/2017   Procedure: INCISION AND DRAINAGE ABSCESS;  Surgeon: Nickie Retort, MD;  Location: [facility] ORS;  Service: Urology;  Laterality: Right;   JOINT REPLACEMENT     bilateral   left ankle surgery      05/03/2003    left carpel tunnel      09/18/1993   left knee meniscal tear      01/25/2010   left knee meniscal tear repair      05/04/1996   left rotator cuff repair      05/03/2003    LOOP RECORDER INSERTION N/A 09/20/2018   Procedure: LOOP RECORDER INSERTION;  Surgeon: Evans Lance, MD;  Location: [facility];  Service: Cardiovascular;  Laterality: N/A;   REPLACEMENT TOTAL KNEE BILATERAL  07/13/2015   REVERSE SHOULDER ARTHROPLASTY Left 08/30/2020   Procedure: REVERSE SHOULDER ARTHROPLASTY;  Surgeon: Justice Britain, MD;  Location: [facility];  Service: Orthopedics;  Laterality: Left;  152mn   right ankle surgery      fracture has 2 screws 07/07/1997   right carpel tunnel      05/16/1992   right shoulder replacement      01/27/2006   SCROTAL EXPLORATION Right 02/26/2017   Procedure: SCROTUM EXPLORATION;  Surgeon: BNickie Retort  MD;  Location: [facility] ORS;  Service: Urology;  Laterality: Right;   TEE WITHOUT CARDIOVERSION N/A 09/20/2018   Procedure: TRANSESOPHAGEAL ECHOCARDIOGRAM (TEE);  Surgeon: Josue Hector, MD;  Location: [facility] ENDOSCOPY;  Service: Cardiovascular;  Laterality: N/A;   TOTAL KNEE ARTHROPLASTY Left 07/13/2015   Procedure: LEFT TOTAL KNEE ARTHROPLASTY;  Surgeon: Gaynelle Arabian, MD;  Location: [facility];  Service: Orthopedics;  Laterality: Left;    FAMILY HISTORY Family History  Problem Relation Age of Onset   Cancer Mother        pancreatic   Diabetes Mother    Stroke Mother    Heart disease Mother    Hyperlipidemia Mother    Hypertension Mother    Heart attack Mother    Heart disease Father    Stroke Father    Diabetes Father    Hypertension Father    Heart attack Father    Hyperlipidemia Father    Pancreatic cancer Father    Cancer Sister    Cancer Brother        lung   Cancer Brother    Kidney cancer Neg Hx    Bladder Cancer Neg Hx    Prostate cancer Neg Hx     SOCIAL HISTORY Social History   Tobacco Use   Smoking status: Former    Packs/day: 1.00    Years: 10.00    Total pack years: 10.00    Types: Cigarettes    Quit date: 07/07/1984    Years since quitting: 37.5   Smokeless tobacco: Never   Tobacco comments:    Former Smoker quit 1986 07/23/2021  Vaping Use   Vaping Use: Never used  Substance Use Topics   Alcohol  use: No    Alcohol/week: 0.0 standard drinks of alcohol   Drug use: No         OPHTHALMIC EXAM:  Base Eye Exam     Visual Acuity (ETDRS)       Right Left   Dist [place] 20/25 20/20         Tonometry (Tonopen, 8:46 AM)       Right Left   Pressure 6 10         Pupils       Pupils   Right PERRL   Left PERRL         Visual Fields       Left Right    Full Full         Neuro/Psych     Oriented x3: Yes   Mood/Affect: Normal         Dilation     Right eye: 2.5% Phenylephrine, 1.0% Mydriacyl @ 8:41 AM           Slit Lamp and Fundus Exam     External Exam       Right Left   External Normal Normal         Slit Lamp Exam       Right Left   Lids/Lashes Normal Normal   Conjunctiva/Sclera White and quiet White and quiet   Cornea Clear Clear   Anterior Chamber Deep and quiet Deep and quiet   Iris Round and reactive Round and reactive   Lens Posterior chamber intraocular lens Posterior chamber intraocular lens   Anterior Vitreous Normal Normal         Fundus Exam       Right Left   Posterior Vitreous Posterior vitreous detachment, Central vitreous floaters    Disc Normal  C/D Ratio 0.6    Macula Hard drusen, no exudates, Macular thickening, Soft drusen, Pigmented atrophy, Retinal pigment epithelial mottling, Retinal pigment epithelial detachment    Vessels Normal, , no DR    Periphery Normal             IMAGING AND PROCEDURES  Imaging and Procedures for 01/30/22  OCT, Retina - OU - Both Eyes       Right Eye Quality was good. Scan locations included subfoveal. Central Foveal Thickness: 345. Progression has improved. Findings include no IRF, abnormal foveal contour, pigment epithelial detachment, subretinal fluid.   Left Eye Quality was good. Scan locations included subfoveal. Central Foveal Thickness: 290. Progression has been stable. Findings include no IRF, no SRF, abnormal foveal contour, retinal drusen .   Notes Today  OD at 6 week interval, small change with increased serous retinal detachment adjacent to the vascularized pigment epithelial detachment nasal to the FAZ.  We will repeat injection today however will change from Eylea to Vabysmo today at 8-week interval examination again in 6 weeks     Intravitreal Injection, Pharmacologic Agent - OD - Right Eye       Time Out 01/30/2022. 9:15 AM. Confirmed correct patient, procedure, site, and patient consented.   Anesthesia Topical anesthesia was used. Anesthetic medications included Lidocaine 4%.   Procedure Preparation included 5% betadine to ocular surface, 10% betadine to eyelids, Tobramycin 0.3%. A 30 gauge needle was used.   Injection: 6 mg faricimab-svoa 6 MG/0.05ML   Route: Intravitreal, Site: Right Eye   NDC: [phone], Lot: [id number], Expiration date: 11/05/2023, Waste: 0 mL   Post-op Post injection exam found visual acuity of at least counting fingers. The patient tolerated the procedure well. There were no complications. The patient received written and verbal post procedure care education. Post injection medications included ocuflox.              ASSESSMENT/PLAN:  Exudative age-related macular degeneration of right eye with active choroidal neovascularization (HCC) Chronic active subfoveal serous detachment, now some 6 weeks after Vabysmo injection #1 improving findings.  Repeat injection today and reevaluate again in 6 weeks  Serous detachment of retinal pigment epithelium of right eye Component of wet AMD improving with treatment of disease with Vabysmo  Intermediate stage nonexudative age-related macular degeneration of left eye No sign of CNVM OS today      ICD-10-CM   1. Exudative age-related macular degeneration of right eye with active choroidal neovascularization (HCC)  H35.3211 OCT, Retina - OU - Both Eyes    Intravitreal Injection, Pharmacologic Agent - OD - Right Eye    faricimab-svoa (VABYSMO) '6mg'$ /0.50m  intravitreal injection    2. Serous detachment of retinal pigment epithelium of right eye  H35.721     3. Intermediate stage nonexudative age-related macular degeneration of left eye  H35.3122       1.  Repeat injection Vabysmo OD today as condition improving  2.  3.  Ophthalmic Meds Ordered this visit:  Meds ordered this encounter  Medications   faricimab-svoa (VABYSMO) '6mg'$ /0.011mintravitreal injection       Return in about 6 weeks (around 03/13/2022) for dilate, OD, VABYSMO OCT.  There are no Patient Instructions on file for this visit.   Explained the diagnoses, plan, and follow up with the patient and they expressed understanding.  Patient expressed understanding of the importance of proper follow up care.   GaClent Demarkankin M.D. Diseases & Surgery of the Retina and Vitreous [facility] 01/30/22     Abbreviations: M myopia (nearsighted); A astigmatism; H hyperopia (farsighted); P presbyopia; Mrx spectacle prescription;  CTL contact lenses; OD right eye; OS left eye; OU both eyes  XT exotropia; ET esotropia; PEK punctate epithelial keratitis; PEE punctate epithelial erosions; DES dry eye syndrome; MGD meibomian gland dysfunction; ATs artificial tears; PFAT's preservative free artificial tears; [place] nuclear sclerotic cataract; PSC posterior subcapsular cataract; ERM epi-retinal membrane; PVD posterior vitreous detachment; RD retinal detachment; DM diabetes mellitus; DR diabetic retinopathy; NPDR non-proliferative diabetic retinopathy; PDR proliferative diabetic retinopathy; CSME clinically significant macular edema; DME diabetic macular edema; dbh dot blot hemorrhages; CWS cotton wool spot; POAG primary open angle glaucoma; C/D cup-to-disc ratio; HVF humphrey visual field; GVF goldmann visual field; OCT optical coherence tomography; IOP intraocular pressure; BRVO Branch retinal vein occlusion; CRVO central retinal vein occlusion; CRAO central retinal artery  occlusion; BRAO branch retinal artery occlusion; RT retinal tear; SB scleral buckle; PPV pars plana vitrectomy; VH Vitreous hemorrhage; PRP panretinal laser photocoagulation; IVK intravitreal kenalog; VMT vitreomacular traction; MH Macular hole;  NVD neovascularization of the disc; NVE neovascularization elsewhere; AREDS age related eye disease study; ARMD age related macular degeneration; POAG primary open angle glaucoma; EBMD epithelial/anterior basement membrane dystrophy; ACIOL anterior chamber intraocular lens; IOL intraocular lens; PCIOL posterior chamber intraocular lens; Phaco/IOL phacoemulsification with intraocular lens placement; [place] photorefractive keratectomy; LASIK laser assisted in situ keratomileusis; HTN hypertension; DM diabetes mellitus; COPD chronic obstructive pulmonary disease

## 2022-01-30 NOTE — Progress Notes (Signed)
I have attempted to reach out to the patient to reschedule his appointment with the clinical pharmacist on 01/27/22. I have left a voicemail for the patient to return phone call to reschedule his appointment.   Corrie Mckusick, [place]

## 2022-01-30 NOTE — Assessment & Plan Note (Signed)
No sign of CNVM OS today

## 2022-01-30 NOTE — Assessment & Plan Note (Signed)
Component of wet AMD improving with treatment of disease with Vabysmo

## 2022-01-30 NOTE — Assessment & Plan Note (Signed)
Chronic active subfoveal serous detachment, now some 6 weeks after Vabysmo injection #1 improving findings.  Repeat injection today and reevaluate again in 6 weeks

## 2022-01-31 ENCOUNTER — Ambulatory Visit (INDEPENDENT_AMBULATORY_CARE_PROVIDER_SITE_OTHER): Payer: PPO

## 2022-01-31 DIAGNOSIS — I639 Cerebral infarction, unspecified: Secondary | ICD-10-CM

## 2022-02-03 ENCOUNTER — Telehealth: Payer: Self-pay

## 2022-02-03 NOTE — Telephone Encounter (Signed)
-----   Message from Vickie Epley, MD sent at 01/25/2022  4:25 PM EDT ----- Plan for Eliquis '5mg'$  PO BID for 4 weeks prior with the goal of continuing for 45 days after implant. Thanks, Lysbeth Galas T. Quentin Ore, MD, [facility], [facility] Cardiac Electrophysiology   ----- Message ----- From: Theodoro Parma, RN Sent: 01/13/2022  12:16 PM EDT To: Vickie Epley, MD  Reviewed results with patient who verbalized understanding.   The patient wishes to proceed with LAAO on 03/06/2022. Scheduled him for pre-procedure visit with Watchman APP 02/12/2022.   Will route to Dr. Quentin Ore for anticoagulation advisement pre- and post-procedure as the patient is not currently taking an a/c. He was previously taking Eliquis.

## 2022-02-03 NOTE — Telephone Encounter (Signed)
[name], NP      01/29/22  1:53 PM Note Called to speak with the patient about starting Eliquis '5mg'$  BID in anticipation of Watchman implant scheduled for 8/31 with Dr. Quentin Ore. I will see him 8/9 for pre Watchman. [place] updated.    Kathyrn Drown NP-C Structural Heart Team  Pager: [phone] Phone: [phone]       Per Dr. Quentin Ore, reiterated to the patient to START ELIQUIS 5 mg BID 8/3. He will keep pre-Watchman visit as scheduled in preparation for procedure on 03/06/2022. He was grateful for call and agrees with plan.

## 2022-02-06 ENCOUNTER — Telehealth: Payer: Self-pay

## 2022-02-06 NOTE — Chronic Care Management (AMB) (Signed)
Chronic Care Management Pharmacy Assistant   Name: Andrew Cobb  MRN: [id number] DOB: Nov 26, 1950   Reason for Encounter: Disease State Hypertension   Recent office visits:  01/13/22-Jolene T. Ned Card, NP (PCP) General follow up visit. Labs ordered. Follow up in 3 months. 12/17/21-Jolene T. Ned Card, NP (PCP) Seen for hypertension.  Will increase Telmisartan back to original 40 MG daily. Follow up in 27 days. 11/12/21-Jolene T. [name], NP (PCP) Seen for hypertension and a cyst. Will restart Telmisartan at lower dosing or 20 MG daily. Follow up in 5 weeks. 11/01/21-Jolene T. Ned Card, NP (PCP) General follow up visit. Labs ordered. Follow up in 10 weeks. 10/18/21-Jolene T. Ned Card, NP (PCP) General follow up visit. Labs ordered. Ambulatory referral to Orthopedics. Follow up in 10 days. 10/11/21-Jolene T. Ned Card, NP (PCP) General follow up visit. Labs ordered. Ambulatory referral to Home Health. Follow up in 1 week.  Recent consult visits:  01/30/22-Gary A. [name],MD (Retina Specialist) Seen for Macular Degeneration. faricimab-svoa (VABYSMO) '6mg'$ /0.77m intravitreal injection given. Follow up in 6 weeks. 12/19/21-Gary A. [name],MD (Retina Specialist) Seen for Macular Degeneration. faricimab-svoa (VABYSMO) '6mg'$ /0.037mintravitreal injection given. Follow up in 6 weeks. 12/11/21-Cameron T. LaQuentin OreMD (Cardiology) Seen for Atrial fibrillation. 11/20/21-Robert Groat (Ophthalmology) Notes not available. 10/23/21-Gary A. [name] (Ophthalmology) Seen for Macular Degeneration. Follow up in 6 weeks. 10/22/21-Matthew Crawford (Orthopedic Surgery) Notes not available.  [facility] not available.  [facility] not [facility].  08/28/21-Gary A. [name], MD (Ophthalmology) Seen for Macular Degeneration. Follow up in 6 weeks. [facility]12[facility] not available .  Hospital visits:  Medication Reconciliation was  completed by comparing discharge summary, patient's EMR and Pharmacy list, and upon discussion with patient.  Admitted to the hospital on 10/04/21 due to weakness. Discharge date was 10/05/21. [facility] -[facility] none noted  Medication Changes at Hospital Discharge: -Changed none noted  Medications Discontinued at Hospital Discharge: -Stopped none noted  Medications that remain the same after Hospital Discharge:??  -All other medications will remain the same.    Medications: Outpatient Encounter Medications as of 02/06/2022  Medication Sig   acyclovir (ZOVIRAX) 400 MG tablet Take 1 tablet (400 mg total) by mouth 2 (two) times daily.   albuterol (VENTOLIN HFA) 108 (90 Base) MCG/ACT inhaler Inhale 2 puffs into the lungs every 6 (six) hours as needed for wheezing or shortness of breath.   amitriptyline (ELAVIL) 25 MG tablet Take 1 tablet (25 mg total) by mouth at bedtime.   apixaban (ELIQUIS) 5 MG TABS tablet Take 1 tablet (5 mg total) by mouth 2 (two) times daily.   atorvastatin (LIPITOR) 40 MG tablet Take 1 tablet (40 mg total) by mouth at bedtime.   cetirizine (ZYRTEC) 10 MG tablet Take 10 mg by mouth daily.   Cholecalciferol (VITAMIN D) 2000 units CAPS Take 2,000 Units by mouth daily.   Cyanocobalamin (B-12) 5000 MCG CAPS Take 5,000 mcg by mouth every other day.   empagliflozin (JARDIANCE) 25 MG TABS tablet Take by mouth daily.   fluticasone (FLONASE) 50 MCG/ACT nasal spray Place 2 sprays into both nostrils daily.   glucose blood (ONETOUCH ULTRA) test strip 1 each by Other route 2 (two) times daily. Use as instructed   Lancets (ONETOUCH ULTRASOFT) lancets USE TO CHECK BLOOD SUAGR TWICE DAILY   levETIRAcetam (KEPPRA) 500 MG tablet Take 1 tablet (500 mg total) by mouth 2 (two) times daily.   metFORMIN (GLUCOPHAGE)  1000 MG tablet Take 1 tablet (1,000 mg total) by mouth 2 (two) times daily with a meal.    Multiple Vitamins-Minerals (MULTIVITAMIN PO) Take 1 tablet by mouth daily.    tamsulosin (FLOMAX) 0.4 MG CAPS capsule Take 1 capsule (0.4 mg total) by mouth at bedtime.   telmisartan (MICARDIS) 40 MG tablet Take 1 tablet (40 mg total) by mouth daily.   Facility-Administered Encounter Medications as of 02/06/2022  Medication   0.9 %  sodium chloride infusion   0.9 %  sodium chloride infusion   Current antihypertensive regimen:  Telmisartan 40 mg take 1 tab daily  How often are you checking your Blood Pressure? daily  Current home BP readings:          02/19/22-144/71  What recent interventions/DTPs have been made by any provider to improve Blood Pressure control since last CPP Visit: 12/17/21-Jolene T. [name], NP (PCP) Will increase Telmisartan back to original 40 MG daily. 11/12/21-Jolene T. Ned Card, NP (PCP) . Will restart Telmisartan at lower dosing or 20 MG daily   Any recent hospitalizations or ED visits since last visit with CPP? Yes  What diet changes have been made to improve Blood Pressure Control?  Patient states she does not eat salty foods and eats salads.  What exercise is being done to improve your Blood Pressure Control?  Patient states he does exercise he goes to the [facility] and does water aerobics everyday.  Patient declined to continue to see Clinical pharmacist and denied to schedule an future appointment.  Adherence Review: Is the patient currently on ACE/ARB medication? Yes Does the patient have >5 day gap between last estimated fill dates? No   Care Gaps: INFLUENZA VACCINE:Due since 02/04/2022   Star Rating Drugs: Atorvastatin 40 mg Last filled:01/17/22 90 DS Jardiance 25 mg Last filled:None noted Metformin 1000 mg Last filled:02/06/22 90 DS Telmisartan 40 mg Last filled:11/13/21 90 DS  Myriam Elta [place], [place]

## 2022-02-11 NOTE — Progress Notes (Unsigned)
[facility]                                     Cardiology Office Note:    Date:  02/12/2022   ID:  Andrew Cobb, DOB July 04, 1951, MRN [id number]  PCP:  [name], NP  [facility] Cardiologist:  None  [facility] Electrophysiologist:  Cristopher Peru, MD   Referring MD: [name], NP   Chief Complaint  Patient presents with   Follow-up    Pre Watchman assessment    History of Present Illness:    Andrew Cobb is a 71 y.o. male with a hx of CAD s/p RCA PCI, DM2, OSA on CPAP, HTN, HLD, CVA 2020, and persistent atrial fibrillation who presents for follow up prior to [facility] implant.    Andrew Cobb was initially diagnosed with atrial fibrillation by ILR 05/07/21 which was implanted for cryptogenic CVA in 09/2018. He underwent DCCV 07/04/21. He has been rather asymptomatic with his AF. He has a CHADS2VASC score of 6. In 07/2021 he was taken off his anticoagulation after sustaining a fall complicated by left hip hematoma. His Hb at that time dropped 4 points and he was transfused with PRBCs. He was referred to Dr. Quentin Ore for potential Watchman implant. CT imaging performed 01/06/22 showed anatomy suitable for implant.   He is here today alone. We reviewed pre Watchman instructions and all questions were answered. He denies chest pain, palpitations, SOB, LE edema, orthopnea, dizziness, or syncope.   Past Medical History:  Diagnosis Date   Arthritis    Asthma    has not needed since 6/21   Cancer [facility])    prostate   Chronic venous insufficiency    varicose vein lower extremity with inflammation   Coronary artery disease 1996   two stents placed    Diabetes mellitus without complication ([place])    type 2 on metformin   GERD (gastroesophageal reflux disease)    no issues since gastric bypass surgery as stated per pt   Hyperlipidemia    Hypertension    Hypogonadism in male    MRSA (methicillin resistant Staphylococcus aureus) infection    07/30/2008  thru 08/07/2008  abdominal abcess   Sleep apnea    on BIPAP   Stented coronary artery    Stroke ([place]) 09/07/2019   no defecits   Thrombocythemia     Past Surgical History:  Procedure Laterality Date   ANGIOPLASTY     ANGIOPLASTY     with stent 04/07/1995   ANTERIOR CERVICAL DECOMPRESSION/DISCECTOMY FUSION 4 LEVELS N/A 08/17/2017   Procedure: Anterior discectomy with fusion and plate fixation Cervical Three-Four, Four-Five, Five-Six, and Six-Seven Fusion;  Surgeon: Ditty, Kevan Ny, MD;  Location: [place];  Service: Neurosurgery;  Laterality: N/A;  Anterior discectomy with fusion and plate fixation Cervical Three-Four, Four-Five, Five-Six, and Six-Seven Fusion    APPENDECTOMY     1966   BUBBLE STUDY  09/20/2018   Procedure: BUBBLE STUDY;  Surgeon: Josue Hector, MD;  Location: [facility] ENDOSCOPY;  Service: Cardiovascular;;   [place] TEST     07/31/2011   CARDIOVERSION N/A 07/04/2021   Procedure: CARDIOVERSION;  Surgeon: [name], MD;  Location: [place] ENDOSCOPY;  Service: Cardiovascular;  Laterality: N/A;   CARPAL TUNNEL RELEASE Left    CHOLECYSTECTOMY     2006   COLONOSCOPY WITH PROPOFOL  N/A 03/26/2021   Procedure: COLONOSCOPY WITH PROPOFOL;  Surgeon: Jonathon Bellows, MD;  Location: [facility] ENDOSCOPY;  Service: Gastroenterology;  Laterality: N/A;   colonscopy      08/25/2012   EYE SURGERY Bilateral    cataract   FUNCTIONAL ENDOSCOPIC SINUS SURGERY     11/10/2013   GASTRIC BYPASS     10/05/2012   HERNIA REPAIR     left inguinal 1981   INCISION AND DRAINAGE ABSCESS Right 02/26/2017   Procedure: INCISION AND DRAINAGE ABSCESS;  Surgeon: Nickie Retort, MD;  Location: [facility] ORS;  Service: Urology;  Laterality: Right;   JOINT REPLACEMENT     bilateral   left ankle surgery      05/03/2003    left carpel tunnel      09/18/1993   left knee meniscal tear      01/25/2010   left knee meniscal tear repair      05/04/1996   left rotator cuff  repair      05/03/2003    LOOP RECORDER INSERTION N/A 09/20/2018   Procedure: LOOP RECORDER INSERTION;  Surgeon: Evans Lance, MD;  Location: [place] CV LAB;  Service: Cardiovascular;  Laterality: N/A;   REPLACEMENT TOTAL KNEE BILATERAL  07/13/2015   REVERSE SHOULDER ARTHROPLASTY Left 08/30/2020   Procedure: REVERSE SHOULDER ARTHROPLASTY;  Surgeon: Justice Britain, MD;  Location: [facility];  Service: Orthopedics;  Laterality: Left;  141mn   right ankle surgery      fracture has 2 screws 07/07/1997   right carpel tunnel      05/16/1992   right shoulder replacement      01/27/2006   SCROTAL EXPLORATION Right 02/26/2017   Procedure: SCROTUM EXPLORATION;  Surgeon: BNickie Retort MD;  Location: [facility] ORS;  Service: Urology;  Laterality: Right;   TEE WITHOUT CARDIOVERSION N/A 09/20/2018   Procedure: TRANSESOPHAGEAL ECHOCARDIOGRAM (TEE);  Surgeon: NJosue Hector MD;  [facility];  Service: Cardiovascular;  Laterality: N/A;   TOTAL KNEE ARTHROPLASTY Left 07/13/2015   Procedure: LEFT TOTAL KNEE ARTHROPLASTY;  Surgeon: FGaynelle Arabian MD;  Location: [facility];  Service: Orthopedics;  Laterality: Left;    Current Medications: Current Meds  Medication Sig   acyclovir (ZOVIRAX) 400 MG tablet Take 1 tablet (400 mg total) by mouth 2 (two) times daily.   albuterol (VENTOLIN HFA) 108 (90 Base) MCG/ACT inhaler Inhale 2 puffs into the lungs every 6 (six) hours as needed for wheezing or shortness of breath.   amitriptyline (ELAVIL) 25 MG tablet Take 1 tablet (25 mg total) by mouth at bedtime.   apixaban (ELIQUIS) 5 MG TABS tablet Take 1 tablet (5 mg total) by mouth 2 (two) times daily.   atorvastatin (LIPITOR) 40 MG tablet Take 1 tablet (40 mg total) by mouth at bedtime.   cetirizine (ZYRTEC) 10 MG tablet Take 10 mg by mouth daily.   Cholecalciferol (VITAMIN D) 2000 units CAPS Take 2,000 Units by mouth daily.   Cyanocobalamin (B-12) 5000 MCG CAPS Take 5,000 mcg by mouth every other day.    empagliflozin (JARDIANCE) 25 MG TABS tablet Take by mouth daily.   fluticasone (FLONASE) 50 MCG/ACT nasal spray Place 2 sprays into both nostrils daily.   glucose blood (ONETOUCH ULTRA) test strip 1 each by Other route 2 (two) times daily. Use as instructed   Lancets (ONETOUCH ULTRASOFT) lancets USE TO CHECK BLOOD SUAGR TWICE DAILY   levETIRAcetam (KEPPRA) 500 MG tablet Take 1 tablet (500 mg total) by mouth 2 (two) times daily.  metFORMIN (GLUCOPHAGE) 1000 MG tablet Take 1 tablet (1,000 mg total) by mouth 2 (two) times daily with a meal.   Multiple Vitamins-Minerals (MULTIVITAMIN PO) Take 1 tablet by mouth daily.    tamsulosin (FLOMAX) 0.4 MG CAPS capsule Take 1 capsule (0.4 mg total) by mouth at bedtime.   telmisartan (MICARDIS) 40 MG tablet Take 1 tablet (40 mg total) by mouth daily.   Current Facility-Administered Medications for the 02/12/22 encounter (Office Visit) with [facility]  Medication   0.9 %  sodium chloride infusion   0.9 %  sodium chloride infusion     Allergies:   Other, Succinylsulphathiazole, Sulfamethoxazole-trimethoprim, and Tetracyclines & related   Social History   Socioeconomic History   Marital status: Married    Spouse name: Not on file   Number of children: Not on file   Years of education: Not on file   Highest education level: Some college, no degree  Occupational History    Comment: drives for [employer]   Tobacco Use   Smoking status: Former    Packs/day: 1.00    Years: 10.00    Total pack years: 10.00    Types: Cigarettes    Quit date: 07/07/1984    Years since quitting: 37.6   Smokeless tobacco: Never   Tobacco comments:    Former Smoker quit 1986 07/23/2021  Vaping Use   Vaping Use: Never used  Substance and Sexual Activity   Alcohol use: No    Alcohol/week: 0.0 standard drinks of alcohol   Drug use: No   Sexual activity: Yes  Other Topics Concern   Not on file  Social History Narrative   Not on file   Social  Determinants of Health   Financial Resource Strain: Low Risk  (07/24/2021)   Overall Financial Resource Strain (CARDIA)    Difficulty of Paying Living Expenses: Not hard at all  Food Insecurity: No Food Insecurity (07/24/2021)   Hunger Vital Sign    Worried About Running Out of Food in the Last Year: Never true    [place] in the Last Year: Never true  Transportation Needs: No Transportation Needs (07/24/2021)   PRAPARE - Hydrologist (Medical): No    Lack of Transportation (Non-Medical): No  Physical Activity: Sufficiently Active (07/24/2021)   Exercise Vital Sign    Days of Exercise per Week: 4 days    Minutes of Exercise per Session: 50 min  Stress: No Stress Concern Present (07/24/2021)   [place]    Feeling of Stress : Not at all  Social Connections: [place] (07/24/2021)   Social Connection and Isolation Panel [NHANES]    Frequency of Communication with Friends and Family: Twice a week    Frequency of Social Gatherings with Friends and Family: More than three times a week    Attends Religious Services: More than 4 times per year    Active Member of [employer] or Organizations: Yes    Attends Music therapist: More than 4 times per year    Marital Status: Married     Family History: The patient's family history includes Cancer in his brother, brother, mother, and sister; Diabetes in his father and mother; Heart attack in his father and mother; Heart disease in his father and mother; Hyperlipidemia in his father and mother; Hypertension in his father and mother; Pancreatic cancer in his father; Stroke in his father and mother. There is  no history of Kidney cancer, Bladder Cancer, or Prostate cancer.  ROS:   Please see the history of present illness.    All other systems reviewed and are negative.  EKGs/Labs/Other Studies Reviewed:    The following studies were  reviewed today:  CT Pulmonary morph 01/06/22:  IMPRESSION: 1. There is normal variant pulmonary vein drainage into the left atrium.   2. The left atrial appendage is patent.   3. Watchman Flx device measurements as noted above.   4. Prior RCA PCI.  EKG:  EKG is ordered today.  The ekg ordered today demonstrates NSR with 1st degres AV block, HR 63bpm.   Recent Labs: 10/11/2021: ALT 27; TSH 1.490 02/12/2022: BUN 28; Creatinine, Ser 1.16; Hemoglobin 14.1; Platelets 142; Potassium 4.6; Sodium 140   Recent Lipid Panel    Component Value Date/Time   CHOL 97 (L) 10/11/2021 0940   CHOL 111 01/25/2018 0815   TRIG 84 10/11/2021 0940   TRIG 150 (H) 01/25/2018 0815   HDL 53 10/11/2021 0940   CHOLHDL 2.6 09/08/2019 0350   VLDL 15 09/08/2019 0350   VLDL 30 (H) 01/25/2018 0815   LDLCALC 27 10/11/2021 0940   Risk Assessment/Calculations:    CHA2DS2-VASc Score = 6  The patient's score is based upon: CHF History: 0 HTN History: 1 Diabetes History: 1 Stroke History: 2 Vascular Disease History: 1 Age Score: 1 Gender Score: 0  HAS-BLED score 3 Hypertension (Uncontrolled in 30 days)  No  Abnormal renal and liver function (Dialysis, transplant, Cr >2.26 mg/dL /Cirrhosis or Bilirubin >2x Normal or AST/ALT/AP >3x Normal) No  Stroke Yes  Bleeding Yes  Labile INR (Unstable/high INR) No  Elderly (>65) Yes  Drugs or alcohol (? 8 drinks/week, anti-plt or NSAID) No   Physical Exam:    VS:  BP 132/68   Pulse 63   Ht '5\' 9"'$  (1.753 m)   Wt 205 lb 6.4 oz (93.2 kg)   SpO2 96%   BMI 30.33 kg/m     Wt Readings from Last 3 Encounters:  02/12/22 205 lb 6.4 oz (93.2 kg)  01/21/22 204 lb 1.6 oz (92.6 kg)  01/13/22 205 lb (93 kg)    General: Well developed, well nourished, NAD Neck: Negative for carotid bruits. No JVD Lungs:Clear to ausculation bilaterally. Breathing is unlabored. Cardiovascular: RRR with S1 S2. No murmurs Extremities: No edema. Neuro: Alert and oriented. No focal deficits.  No facial asymmetry. MAE spontaneously. Psych: Responds to questions appropriately with normal affect.    ASSESSMENT/PLAN:    Persistent atrial fibrillation: Has a hx of persistent atrial fibrillation previously on anticoagulation. This was stopped due to significant fall c/b hip hematoma requiring PRBC transfusion. Seen by Dr. Quentin Ore with plans for LAAO closure with Watchman. CT imaging with anatomy suitable for implant. Obtain BMET, CBC today. Instructions reviewed and all questions answered.   CAD: s/p RCA PCI with no anginal symptoms. No ASA in the setting of Eliquis.   OSA on CPAP: Encouraged compliance   HTN: Stable today with no changes needed at this time.   Medication Adjustments/Labs and Tests Ordered: Current medicines are reviewed at length with the patient today.  Concerns regarding medicines are outlined above.  Orders Placed This Encounter  Procedures   CBC   Basic metabolic panel   EKG 83-JASN   No orders of the defined types were placed in this encounter.   Patient Instructions  Medication Instructions:  No changes *If you need a refill on your cardiac medications before your  next appointment, please call your pharmacy*   Lab Work: BMET, CBC   Testing/Procedures: Watchman (Left Atrial Appendage Device Implant) 03/06/22   Follow-Up: Will be arranged during your hospital stay.   Signed, Kathyrn Drown, NP  02/12/2022 3:03 PM    [facility]

## 2022-02-12 ENCOUNTER — Ambulatory Visit (INDEPENDENT_AMBULATORY_CARE_PROVIDER_SITE_OTHER): Payer: PPO | Admitting: Cardiology

## 2022-02-12 VITALS — BP 132/68 | HR 63 | Ht 69.0 in | Wt 205.4 lb

## 2022-02-12 DIAGNOSIS — I152 Hypertension secondary to endocrine disorders: Secondary | ICD-10-CM

## 2022-02-12 DIAGNOSIS — I4819 Other persistent atrial fibrillation: Secondary | ICD-10-CM | POA: Diagnosis not present

## 2022-02-12 DIAGNOSIS — I2510 Atherosclerotic heart disease of native coronary artery without angina pectoris: Secondary | ICD-10-CM | POA: Diagnosis not present

## 2022-02-12 DIAGNOSIS — Z01812 Encounter for preprocedural laboratory examination: Secondary | ICD-10-CM

## 2022-02-12 DIAGNOSIS — D6869 Other thrombophilia: Secondary | ICD-10-CM

## 2022-02-12 DIAGNOSIS — E1159 Type 2 diabetes mellitus with other circulatory complications: Secondary | ICD-10-CM | POA: Diagnosis not present

## 2022-02-12 LAB — BASIC METABOLIC PANEL
BUN/Creatinine Ratio: 24 (ref 10–24)
BUN: 28 mg/dL — ABNORMAL HIGH (ref 8–27)
CO2: 26 mmol/L (ref 20–29)
Calcium: 9.7 mg/dL (ref 8.6–10.2)
Chloride: 105 mmol/L (ref 96–106)
Creatinine, Ser: 1.16 mg/dL (ref 0.76–1.27)
Glucose: 142 mg/dL — ABNORMAL HIGH (ref 70–99)
Potassium: 4.6 mmol/L (ref 3.5–5.2)
Sodium: 140 mmol/L (ref 134–144)
eGFR: 67 mL/min/{1.73_m2} (ref >59)

## 2022-02-12 LAB — CBC
Hematocrit: 42.4 % (ref 37.5–51.0)
Hemoglobin: 14.1 g/dL (ref 13.0–17.7)
MCH: 30.3 pg (ref 26.6–33.0)
MCHC: 33.3 g/dL (ref 31.5–35.7)
MCV: 91 fL (ref 79–97)
Platelets: 142 10*3/uL — ABNORMAL LOW (ref 150–450)
RBC: 4.66 x10E6/uL (ref 4.14–5.80)
RDW: 14.8 % (ref 11.6–15.4)
WBC: 4.6 10*3/uL (ref 3.4–10.8)

## 2022-02-12 NOTE — Patient Instructions (Signed)
Medication Instructions:  No changes *If you need a refill on your cardiac medications before your next appointment, please call your pharmacy*   Lab Work: BMET, CBC   Testing/Procedures: Watchman (Left Atrial Appendage Device Implant) 03/06/22   Follow-Up: Will be arranged during your hospital stay.

## 2022-02-19 NOTE — Progress Notes (Signed)
Carelink Summary Report / Loop Recorder 

## 2022-02-21 NOTE — Telephone Encounter (Signed)
Let PCP know about BP readings

## 2022-02-27 LAB — CUP PACEART REMOTE DEVICE CHECK
Date Time Interrogation Session: 20230824233420
Implantable Pulse Generator Implant Date: 20200316

## 2022-03-04 ENCOUNTER — Telehealth: Payer: Self-pay

## 2022-03-04 NOTE — Telephone Encounter (Signed)
Confirmed procedure date of 03/06/2022. Confirmed arrival time of 0900 for procedure time at 1130. Reviewed pre-procedure instructions with patient. The patient understands to call if questions/concerns arise prior to procedure.

## 2022-03-04 NOTE — Telephone Encounter (Signed)
Called to confirm procedure date of 03/06/2022 with arrival time of 0900 for procedure time at 1130.   Left message to call back.

## 2022-03-05 ENCOUNTER — Ambulatory Visit (INDEPENDENT_AMBULATORY_CARE_PROVIDER_SITE_OTHER): Payer: PPO

## 2022-03-05 DIAGNOSIS — I639 Cerebral infarction, unspecified: Secondary | ICD-10-CM

## 2022-03-06 ENCOUNTER — Encounter (HOSPITAL_COMMUNITY)
Admission: RE | Disposition: A | Discharge status: Home / Self Care | Payer: Self-pay | Source: Ambulatory Visit | Attending: Cardiology

## 2022-03-06 ENCOUNTER — Inpatient Hospital Stay (HOSPITAL_COMMUNITY): Payer: PPO | Admitting: Anesthesiology

## 2022-03-06 ENCOUNTER — Encounter (HOSPITAL_COMMUNITY): Payer: Self-pay | Admitting: Cardiology

## 2022-03-06 ENCOUNTER — Inpatient Hospital Stay (HOSPITAL_COMMUNITY): Payer: PPO

## 2022-03-06 ENCOUNTER — Ambulatory Visit (HOSPITAL_BASED_OUTPATIENT_CLINIC_OR_DEPARTMENT_OTHER)
Admission: RE | Admit: 2022-03-06 | Discharge: 2022-03-06 | Disposition: A | Discharge status: Home / Self Care | Payer: PPO | Source: Ambulatory Visit | Attending: Cardiology | Admitting: Cardiology

## 2022-03-06 ENCOUNTER — Ambulatory Visit (HOSPITAL_COMMUNITY)
Admission: RE | Admit: 2022-03-06 | Discharge: 2022-03-06 | Disposition: A | Discharge status: Home / Self Care | Payer: PPO | Source: Ambulatory Visit | Attending: Cardiology | Admitting: Cardiology

## 2022-03-06 ENCOUNTER — Other Ambulatory Visit: Payer: Self-pay

## 2022-03-06 DIAGNOSIS — G4730 Sleep apnea, unspecified: Secondary | ICD-10-CM | POA: Insufficient documentation

## 2022-03-06 DIAGNOSIS — Z955 Presence of coronary angioplasty implant and graft: Secondary | ICD-10-CM | POA: Diagnosis not present

## 2022-03-06 DIAGNOSIS — K219 Gastro-esophageal reflux disease without esophagitis: Secondary | ICD-10-CM | POA: Insufficient documentation

## 2022-03-06 DIAGNOSIS — I10 Essential (primary) hypertension: Secondary | ICD-10-CM

## 2022-03-06 DIAGNOSIS — Z87891 Personal history of nicotine dependence: Secondary | ICD-10-CM | POA: Insufficient documentation

## 2022-03-06 DIAGNOSIS — Z7984 Long term (current) use of oral hypoglycemic drugs: Secondary | ICD-10-CM

## 2022-03-06 DIAGNOSIS — I2510 Atherosclerotic heart disease of native coronary artery without angina pectoris: Secondary | ICD-10-CM | POA: Diagnosis not present

## 2022-03-06 DIAGNOSIS — J45909 Unspecified asthma, uncomplicated: Secondary | ICD-10-CM | POA: Diagnosis not present

## 2022-03-06 DIAGNOSIS — Z8673 Personal history of transient ischemic attack (TIA), and cerebral infarction without residual deficits: Secondary | ICD-10-CM | POA: Diagnosis not present

## 2022-03-06 DIAGNOSIS — I080 Rheumatic disorders of both mitral and aortic valves: Secondary | ICD-10-CM | POA: Diagnosis not present

## 2022-03-06 DIAGNOSIS — Z7901 Long term (current) use of anticoagulants: Secondary | ICD-10-CM | POA: Insufficient documentation

## 2022-03-06 DIAGNOSIS — I4811 Longstanding persistent atrial fibrillation: Secondary | ICD-10-CM | POA: Diagnosis not present

## 2022-03-06 DIAGNOSIS — Z006 Encounter for examination for normal comparison and control in clinical research program: Secondary | ICD-10-CM | POA: Diagnosis not present

## 2022-03-06 DIAGNOSIS — E1165 Type 2 diabetes mellitus with hyperglycemia: Secondary | ICD-10-CM | POA: Diagnosis present

## 2022-03-06 DIAGNOSIS — I083 Combined rheumatic disorders of mitral, aortic and tricuspid valves: Secondary | ICD-10-CM | POA: Diagnosis not present

## 2022-03-06 DIAGNOSIS — W19XXXA Unspecified fall, initial encounter: Secondary | ICD-10-CM | POA: Insufficient documentation

## 2022-03-06 DIAGNOSIS — E785 Hyperlipidemia, unspecified: Secondary | ICD-10-CM | POA: Insufficient documentation

## 2022-03-06 DIAGNOSIS — Z9884 Bariatric surgery status: Secondary | ICD-10-CM | POA: Insufficient documentation

## 2022-03-06 DIAGNOSIS — I4891 Unspecified atrial fibrillation: Secondary | ICD-10-CM | POA: Diagnosis not present

## 2022-03-06 DIAGNOSIS — R569 Unspecified convulsions: Secondary | ICD-10-CM | POA: Diagnosis not present

## 2022-03-06 DIAGNOSIS — E119 Type 2 diabetes mellitus without complications: Secondary | ICD-10-CM

## 2022-03-06 DIAGNOSIS — Z8546 Personal history of malignant neoplasm of prostate: Secondary | ICD-10-CM | POA: Insufficient documentation

## 2022-03-06 DIAGNOSIS — E1136 Type 2 diabetes mellitus with diabetic cataract: Secondary | ICD-10-CM | POA: Diagnosis not present

## 2022-03-06 DIAGNOSIS — E1169 Type 2 diabetes mellitus with other specified complication: Secondary | ICD-10-CM | POA: Diagnosis present

## 2022-03-06 DIAGNOSIS — Z01818 Encounter for other preprocedural examination: Secondary | ICD-10-CM | POA: Diagnosis not present

## 2022-03-06 DIAGNOSIS — D62 Acute posthemorrhagic anemia: Secondary | ICD-10-CM | POA: Diagnosis not present

## 2022-03-06 DIAGNOSIS — I4819 Other persistent atrial fibrillation: Secondary | ICD-10-CM | POA: Diagnosis present

## 2022-03-06 DIAGNOSIS — I509 Heart failure, unspecified: Secondary | ICD-10-CM | POA: Diagnosis not present

## 2022-03-06 DIAGNOSIS — I110 Hypertensive heart disease with heart failure: Secondary | ICD-10-CM | POA: Insufficient documentation

## 2022-03-06 DIAGNOSIS — Z95818 Presence of other cardiac implants and grafts: Secondary | ICD-10-CM

## 2022-03-06 HISTORY — PX: TEE WITHOUT CARDIOVERSION: SHX5443

## 2022-03-06 HISTORY — PX: LEFT ATRIAL APPENDAGE OCCLUSION: EP1229

## 2022-03-06 LAB — SURGICAL PCR SCREEN
MRSA, PCR: NEGATIVE
Staphylococcus aureus: POSITIVE — AB

## 2022-03-06 LAB — TYPE AND SCREEN
ABO/RH(D): A POS
Antibody Screen: NEGATIVE

## 2022-03-06 LAB — POCT ACTIVATED CLOTTING TIME: Activated Clotting Time: 317 seconds

## 2022-03-06 LAB — GLUCOSE, CAPILLARY
Glucose-Capillary: 116 mg/dL — ABNORMAL HIGH (ref 70–99)
Glucose-Capillary: 96 mg/dL (ref 70–99)
Glucose-Capillary: 107 mg/dL — ABNORMAL HIGH (ref 70–99)

## 2022-03-06 SURGERY — LEFT ATRIAL APPENDAGE OCCLUSION
Anesthesia: General

## 2022-03-06 MED ORDER — FENTANYL CITRATE (PF) 250 MCG/5ML IJ SOLN
INTRAMUSCULAR | Status: DC | PRN
  Administered 2022-03-06: 100 ug via INTRAVENOUS

## 2022-03-06 MED ORDER — SUGAMMADEX SODIUM 200 MG/2ML IV SOLN
INTRAVENOUS | Status: DC | PRN
  Administered 2022-03-06: 200 mg via INTRAVENOUS

## 2022-03-06 MED ORDER — HEPARIN (PORCINE) IN NACL 2000-0.9 UNIT/L-% IV SOLN
INTRAVENOUS | Status: DC | PRN
  Administered 2022-03-06: 1000 mL

## 2022-03-06 MED ORDER — IOHEXOL 350 MG/ML SOLN
INTRAVENOUS | Status: DC | PRN
  Administered 2022-03-06: 20 mL

## 2022-03-06 MED ORDER — PROTAMINE SULFATE 10 MG/ML IV SOLN
INTRAVENOUS | Status: DC | PRN
  Administered 2022-03-06: 35 mg via INTRAVENOUS

## 2022-03-06 MED ORDER — HEPARIN SODIUM (PORCINE) 1000 UNIT/ML IJ SOLN
INTRAMUSCULAR | Status: DC | PRN
  Administered 2022-03-06: 14000 [IU] via INTRAVENOUS

## 2022-03-06 MED ORDER — LACTATED RINGERS IV SOLN: INTRAVENOUS | Status: DC

## 2022-03-06 MED ORDER — HEPARIN (PORCINE) IN NACL 1000-0.9 UT/500ML-% IV SOLN
INTRAVENOUS | Status: AC
Start: 2022-03-06 — End: ?
  Filled 2022-03-06: qty 1000

## 2022-03-06 MED ORDER — CEFAZOLIN SODIUM-DEXTROSE 2-4 GM/100ML-% IV SOLN
2.0000 g | INTRAVENOUS | Status: AC
  Administered 2022-03-06: 2 g via INTRAVENOUS
  Filled 2022-03-06: qty 100

## 2022-03-06 MED ORDER — DEXAMETHASONE SODIUM PHOSPHATE 10 MG/ML IJ SOLN
INTRAMUSCULAR | Status: DC | PRN
  Administered 2022-03-06: 5 mg via INTRAVENOUS

## 2022-03-06 MED ORDER — ROCURONIUM BROMIDE 10 MG/ML (PF) SYRINGE
PREFILLED_SYRINGE | INTRAVENOUS | Status: DC | PRN
  Administered 2022-03-06: 20 mg via INTRAVENOUS
  Administered 2022-03-06: 30 mg via INTRAVENOUS
  Administered 2022-03-06: 50 mg via INTRAVENOUS

## 2022-03-06 MED ORDER — ONDANSETRON HCL 4 MG/2ML IJ SOLN
INTRAMUSCULAR | Status: DC | PRN
  Administered 2022-03-06: 4 mg via INTRAVENOUS

## 2022-03-06 MED ORDER — HEPARIN (PORCINE) IN NACL 1000-0.9 UT/500ML-% IV SOLN
INTRAVENOUS | Status: DC | PRN
  Administered 2022-03-06: 500 mL

## 2022-03-06 MED ORDER — INSULIN ASPART 100 UNIT/ML IJ SOLN: 0.0000 [IU] | INTRAMUSCULAR | Status: DC | PRN

## 2022-03-06 MED ORDER — PHENYLEPHRINE HCL-NACL 20-0.9 MG/250ML-% IV SOLN
INTRAVENOUS | Status: DC | PRN
  Administered 2022-03-06: 30 ug/min via INTRAVENOUS

## 2022-03-06 MED ORDER — CHLORHEXIDINE GLUCONATE 0.12 % MT SOLN
15.0000 mL | Freq: Once | OROMUCOSAL | Status: AC
Start: 2022-03-06 — End: 2022-03-06
  Administered 2022-03-06: 15 mL via OROMUCOSAL
  Filled 2022-03-06 (×2): qty 15

## 2022-03-06 MED ORDER — PROPOFOL 10 MG/ML IV BOLUS
INTRAVENOUS | Status: DC | PRN
  Administered 2022-03-06: 120 mg via INTRAVENOUS

## 2022-03-06 MED ORDER — LIDOCAINE 2% (20 MG/ML) 5 ML SYRINGE
INTRAMUSCULAR | Status: DC | PRN
  Administered 2022-03-06: 100 mg via INTRAVENOUS

## 2022-03-06 MED ORDER — FENTANYL CITRATE (PF) 100 MCG/2ML IJ SOLN
INTRAMUSCULAR | Status: AC
  Filled 2022-03-06: qty 2

## 2022-03-06 SURGICAL SUPPLY — 22 items
CATH DIAG 6FR PIGTAIL ANGLED (CATHETERS) IMPLANT
CATH NUVISION NON NAV ICE (CATHETERS) IMPLANT
CLOSURE PERCLOSE PROSTYLE (VASCULAR PRODUCTS) IMPLANT
COVER SWIFTLINK CONNECTOR (BAG) IMPLANT
DEVICE WATCHMAN FLX PROC (KITS) IMPLANT
DILATOR VESSEL 38 20CM 11FR (INTRODUCER) IMPLANT
KIT HEART LEFT (KITS) ×2 IMPLANT
KIT SHEA VERSACROSS LAAC CONNE (KITS) IMPLANT
PACK CARDIAC CATHETERIZATION (CUSTOM PROCEDURE TRAY) ×2 IMPLANT
PAD DEFIB RADIO PHYSIO CONN (PAD) ×2 IMPLANT
SHEATH AVANTI 11F 11CM (SHEATH) IMPLANT
SHEATH PERFORMER 16FR 30 (SHEATH) IMPLANT
SHEATH PINNACLE 8F 10CM (SHEATH) IMPLANT
SHEATH PROBE COVER 6X72 (BAG) ×2 IMPLANT
SYS WATCHMAN FXD DBL (SHEATH) ×1
SYSTEM WATCHMAN FXD DBL (SHEATH) IMPLANT
TRANSDUCER W/STOPCOCK (MISCELLANEOUS) ×2 IMPLANT
TUBING CIL FLEX 10 FLL-RA (TUBING) ×2 IMPLANT
WATCHMAN FLX 24 (Prosthesis & Implant Heart) IMPLANT
WATCHMAN FLX 27 (Prosthesis & Implant Heart) IMPLANT
WATCHMAN FLX PROCEDURE DEVICE (KITS) ×1 IMPLANT
WATCHMAN PROCED TRUSEAL ACCESS (SHEATH) IMPLANT

## 2022-03-06 NOTE — Anesthesia Postprocedure Evaluation (Signed)
Anesthesia Post Note  Patient: Andrew Cobb  Procedure(s) Performed: LEFT ATRIAL APPENDAGE OCCLUSION TRANSESOPHAGEAL ECHOCARDIOGRAM (TEE)     Patient location during evaluation: PACU Anesthesia Type: General Level of consciousness: sedated and patient cooperative Pain management: pain level controlled Vital Signs Assessment: post-procedure vital signs reviewed and stable Respiratory status: spontaneous breathing Cardiovascular status: stable Anesthetic complications: no   There were no known notable events for this encounter.  Last Vitals:  Vitals:   03/06/22 1630 03/06/22 1700  BP: 128/77 125/78  Pulse: 77 75  Resp: (!) 24   Temp:    SpO2: 92% 94%    Last Pain:  Vitals:   03/06/22 1515  TempSrc:   PainSc: 0-No pain                 Nolon Nations

## 2022-03-06 NOTE — Anesthesia Procedure Notes (Signed)
Procedure Name: Intubation Date/Time: 03/06/2022 12:27 PM  Performed by: Darletta Moll, CRNAPre-anesthesia Checklist: Patient identified, Emergency Drugs available, Suction available and Patient being monitored Patient Re-evaluated:Patient Re-evaluated prior to induction Oxygen Delivery Method: Circle system utilized Preoxygenation: Pre-oxygenation with 100% oxygen Induction Type: IV induction Ventilation: Mask ventilation without difficulty Laryngoscope Size: Mac and 4 Grade View: Grade I Tube type: Oral Tube size: 7.5 mm Number of attempts: 1 Airway Equipment and Method: Stylet and Oral airway Placement Confirmation: ETT inserted through vocal cords under direct vision, positive ETCO2 and breath sounds checked- equal and bilateral Secured at: 23 cm Tube secured with: Tape Dental Injury: Teeth and Oropharynx as per pre-operative assessment

## 2022-03-06 NOTE — Discharge Instructions (Signed)
Femoral, Care After  This sheet gives you information about how to care for yourself after your procedure. Your health care provider may also give you more specific instructions. If you have problems or questions, contact your health care provider. What can I expect after the procedure? After the procedure, it is common to have: Bruising around your puncture site. Tenderness around your puncture site. Skipped heartbeats. Tiredness (fatigue).  Follow these instructions at home: Puncture site care  Follow instructions from your health care provider about how to take care of your puncture site. Make sure you: If present, leave stitches (sutures), skin glue, or adhesive strips in place. These skin closures may need to stay in place for up to 2 weeks. If adhesive strip edges start to loosen and curl up, you may trim the loose edges. Do not remove adhesive strips completely unless your health care provider tells you to do that. If a large square bandage is present, this may be removed 24 hours after surgery.  Check your puncture site every day for signs of infection. Check for: Redness, swelling, or pain. Fluid or blood. If your puncture site starts to bleed, lie down on your back, apply firm pressure to the area, and contact your health care provider. Warmth. Pus or a bad smell. A pea or small marble sized lump at the site is normal and can take up to three months to resolve.  Driving Do not drive for at least 2 days after your procedure or however long your health care provider recommends. (Do not resume driving if you have previously been instructed not to drive for other health reasons.) Do not drive or use heavy machinery while taking prescription pain medicine. Activity Avoid activities that take a lot of effort for at least 7 days after your procedure. Do not lift anything that is heavier than 5 lb (4.5 kg) for one week.  No sexual activity for 1 week.  Return to your normal activities  as told by your health care provider. Ask your health care provider what activities are safe for you. General instructions Take over-the-counter and prescription medicines only as told by your health care provider. Do not use any products that contain nicotine or tobacco, such as cigarettes and e-cigarettes. If you need help quitting, ask your health care provider. You may shower after 24 hours, but Do not take baths, swim, or use a hot tub for 1 week.  Do not drink alcohol for 24 hours after your procedure. Keep all follow-up visits as told by your health care provider. This is important. Contact a health care provider if: You have redness, mild swelling, or pain around your puncture site. You have fluid or blood coming from your puncture site that stops after applying firm pressure to the area. Your puncture site feels warm to the touch. You have pus or a bad smell coming from your puncture site. You have a fever. You have chest pain or discomfort that spreads to your neck, jaw, or arm. You are sweating a lot. You feel nauseous. You have a fast or irregular heartbeat. You have shortness of breath. You are dizzy or light-headed and feel the need to lie down. You have pain or numbness in the arm or leg closest to your puncture site. Get help right away if: Your puncture site suddenly swells. Your puncture site is bleeding and the bleeding does not stop after applying firm pressure to the area. These symptoms may represent a serious problem that is an  emergency. Do not wait to see if the symptoms will go away. Get medical help right away. Call your local emergency services (911 in the [place] Do not drive yourself to the hospital. Summary After the procedure, it is normal to have bruising and tenderness at the puncture site in your groin, neck, or forearm. Check your puncture site every day for signs of infection. Get help right away if your puncture site is bleeding and the bleeding does  not stop after applying firm pressure to the area. This is a medical emergency. This information is not intended to replace advice given to you by your health care provider. Make sure you discuss any questions you have with your health care provider.

## 2022-03-06 NOTE — Progress Notes (Signed)
  Echocardiogram Echocardiogram Transesophageal has been performed.  Bobbye Charleston 03/06/2022, 2:07 PM

## 2022-03-06 NOTE — Discharge Summary (Addendum)
[facility]    Patient ID: Andrew Cobb,  MRN: [id number], DOB/AGE: [birth year] 71 y.o.  Admit date: 03/06/2022 Discharge date: 03/06/2022  Primary Care Physician: [name], NP  Primary Cardiologist: None  Electrophysiologist: Cristopher Peru, MD  Primary Discharge Diagnosis:  Persistent Atrial Fibrillation Poor candidacy for long term anticoagulation due to h/o GI bleeding  Secondary Discharge Diagnosis:  -CAD s/p RCA PCI -DM2 -OSA on CPAP -HTN -HLD  Procedures This Admission:  Transeptal Puncture Intra-procedural TEE which showed no LAA thrombus Procedure aborted due to poor Watchman anatomy on intra operative imaging   Brief HPI: Andrew Cobb is a 71 y.o. male with a history of CAD s/p RCA PCI, DM2, OSA on CPAP, HTN, HLD, CVA 2020, and persistent atrial fibrillation who presented to [facility] for scheduled Watchman implant.    Andrew Cobb was initially diagnosed with atrial fibrillation by ILR 05/07/21 which was implanted for cryptogenic CVA in 09/2018. He underwent DCCV 07/04/21. He has been rather asymptomatic with his AF. He has a CHADS2VASC score of 6. In 07/2021 he was taken off his anticoagulation after sustaining a fall complicated by left hip hematoma. His Hb at that time dropped 4 points and he was transfused with PRBCs. He was referred to Dr. Quentin Ore for potential Watchman implant. CT imaging performed 01/06/22 showed anatomy suitable for implant.   Hospital Course:  The patient was admitted and underwent attempted left atrial appendage occlusive device placement however this was unsuccessful and the procedure was aborted. He was monitored on telemetry in the post procedure area. Groin site has been without complication on the day of discharge. The patient was examined and considered to be stable for same day discharge on 03/06/22.  Wound care and restrictions were reviewed with the patient. He will restart home dose Eliquis '5mg'$  BID tonight. There are  no plans to re-evaluate him for Watchman in the future.   Physical Exam: Vitals:   03/06/22 1445 03/06/22 1449 03/06/22 1455 03/06/22 1500  BP: 132/63 135/65 (!) 156/123 139/67  Pulse: (!) 59 60 79 60  Resp: '18 14 16 '$ (!) 22  Temp:  98.3 F (36.8 C)    TempSrc:      SpO2: 94% 95% 92% 100%  Weight:      Height:       Labs:   Lab Results  Component Value Date   WBC 4.6 02/12/2022   HGB 14.1 02/12/2022   HCT 42.4 02/12/2022   MCV 91 02/12/2022   PLT 142 (L) 02/12/2022   No results for input(s): "NA", "K", "CL", "CO2", "BUN", "CREATININE", "CALCIUM", "PROT", "BILITOT", "ALKPHOS", "ALT", "AST", "GLUCOSE" in the last 168 hours.  Invalid input(s): "LABALBU"   Discharge Medications:  Allergies as of 03/06/2022       Reactions   Bactrim [sulfamethoxazole-trimethoprim] Rash   Succinylsulphathiazole Rash   Tetracyclines & Related Rash        Medication List     TAKE these medications    acyclovir 400 MG tablet Commonly known as: Zovirax Take 1 tablet (400 mg total) by mouth 2 (two) times daily.   albuterol 108 (90 Base) MCG/ACT inhaler Commonly known as: VENTOLIN HFA Inhale 2 puffs into the lungs every 6 (six) hours as needed for wheezing or shortness of breath.   amitriptyline 25 MG tablet Commonly known as: ELAVIL Take 1 tablet (25 mg total) by mouth at bedtime.   apixaban 5 MG Tabs tablet Commonly known as: ELIQUIS Take 1 tablet (  5 mg total) by mouth 2 (two) times daily.   atorvastatin 40 MG tablet Commonly known as: LIPITOR Take 1 tablet (40 mg total) by mouth at bedtime.   B-12 5000 MCG Caps Take 5,000 mcg by mouth every other day.   cetirizine 10 MG tablet Commonly known as: ZYRTEC Take 10 mg by mouth daily.   empagliflozin 25 MG Tabs tablet Commonly known as: JARDIANCE Take 25 mg by mouth daily.   fluticasone 50 MCG/ACT nasal spray Commonly known as: FLONASE Place 2 sprays into both nostrils daily. What changed:  when to take this reasons to  take this   levETIRAcetam 500 MG tablet Commonly known as: KEPPRA Take 1 tablet (500 mg total) by mouth 2 (two) times daily.   metFORMIN 1000 MG tablet Commonly known as: GLUCOPHAGE Take 1 tablet (1,000 mg total) by mouth 2 (two) times daily with a meal.   MULTIVITAMIN PO Take 1 tablet by mouth daily.   OneTouch Ultra test strip Generic drug: glucose blood 1 each by Other route 2 (two) times daily. Use as instructed   onetouch ultrasoft lancets USE TO CHECK BLOOD SUAGR TWICE DAILY   tamsulosin 0.4 MG Caps capsule Commonly known as: Flomax Take 1 capsule (0.4 mg total) by mouth at bedtime.   telmisartan 40 MG tablet Commonly known as: MICARDIS Take 1 tablet (40 mg total) by mouth daily.   Vitamin D 50 MCG (2000 UT) Caps Take 2,000 Units by mouth daily.        Disposition:  Home  Discharge Instructions     Diet - low sodium heart healthy   Complete by: As directed    Increase activity slowly   Complete by: As directed        Follow-up Information     Tommie Raymond, NP Follow up on 03/17/2022.   Specialty: Cardiology Why: '@12pm'$ . Please arrive at 11:45pm Contact information: [place] [address] [phone]                 Duration of Discharge Encounter: Greater than 30 minutes including physician time.  Signed, Kathyrn Drown, NP  03/06/2022 3:20 PM

## 2022-03-06 NOTE — Anesthesia Preprocedure Evaluation (Addendum)
Anesthesia Evaluation  Patient identified by MRN, date of birth, ID band Patient awake    Reviewed: Allergy & Precautions, NPO status , Patient's Chart, lab work & pertinent test results  History of Anesthesia Complications Negative for: history of anesthetic complications  Airway Mallampati: III  TM Distance: >3 FB Neck ROM: Limited    Dental  (+) Missing, Dental Advisory Given, Teeth Intact   Pulmonary asthma , sleep apnea (BiPAP) , former smoker,    Pulmonary exam normal breath sounds clear to auscultation       Cardiovascular hypertension, Pt. on medications (-) angina+ CAD and + Cardiac Stents  Normal cardiovascular exam Rhythm:Regular Rate:Normal  Echo 09/2021 1. Left ventricular ejection fraction, by estimation, is 60 to 65%. The left ventricle has normal function. The left ventricle has no regional wall motion abnormalities. There is mild left ventricular hypertrophy. Left ventricular diastolic parameters are consistent with Grade I diastolic dysfunction (impaired relaxation).  2. Right ventricular systolic function is normal. The right ventricular size is normal.  3. The mitral valve is normal in structure. No evidence of mitral valve regurgitation. No evidence of mitral stenosis.  4. The aortic valve is normal in structure. Aortic valve regurgitation is not visualized. No aortic stenosis is present.  5. The inferior vena cava is normal in size with greater than 50% respiratory variability, suggesting right atrial pressure of 3 mmHg.    '20 ECHO: normal systolic function, with EF 60-65%, The right ventricle has normal systolic function, mild MR, mild TR   Neuro/Psych Seizures -,  CVA, No Residual Symptoms    GI/Hepatic Neg liver ROS, GERD  Controlled,S/p gastric bypass   Endo/Other  diabetes, Oral Hypoglycemic Agents  Renal/GU negative Renal ROS   Prostate cancer    Musculoskeletal  (+) Arthritis ,    Abdominal   Peds  Hematology eliquis   Anesthesia Other Findings   Reproductive/Obstetrics                           Anesthesia Physical  Anesthesia Plan  ASA: 3  Anesthesia Plan: General   Post-op Pain Management: Minimal or no pain anticipated   Induction: Intravenous  PONV Risk Score and Plan: 2 and Ondansetron, Dexamethasone and Treatment may vary due to age or medical condition  Airway Management Planned: Oral ETT  Additional Equipment: ClearSight  Intra-op Plan:   Post-operative Plan: Extubation in OR  Informed Consent: I have reviewed the patients History and Physical, chart, labs and discussed the procedure including the risks, benefits and alternatives for the proposed anesthesia with the patient or authorized representative who has indicated his/her understanding and acceptance.     Dental advisory given  Plan Discussed with: CRNA  Anesthesia Plan Comments:        Anesthesia Quick Evaluation

## 2022-03-06 NOTE — H&P (Signed)
Electrophysiology Office Note:     Date:  03/06/2022    ID:  Andrew Cobb, DOB [birth year], MRN [id number]   PCP:  [name], NP        [facility] Cardiologist:  None  [facility] Electrophysiologist:  Cristopher Peru, MD    Referring MD: [name], NP    Chief Complaint: Atrial fibrillation   History of Present Illness:     Andrew Cobb is a 71 y.o. male who presents for an evaluation of atrial fibrillation at the request of Katherine Mantle, NP. Their medical history includes prostate cancer, coronary artery disease, diabetes, GERD, hypertension, hyperlipidemia, sleep apnea on BiPAP, stroke.  The patient was last seen by Katherine Mantle on Nov 12, 2021.  During an April appointment with Katherine Mantle, the patient was noted to be off of anticoagulation given her fall risk and recent injuries.  The patient had a previous cardioversion for his atrial fibrillation in December 2022.   I met the patient when he was hospitalized in March after a fall complicated by left hip hematoma.  His hemoglobin dropped 4 points due to significant blood loss in the soft tissues.  When I met the patient October 05, 2021, I discussed left atrial appendage occlusion as a stroke mitigation strategy that would allow him to avoid long-term exposure to anticoagulation.  He presents today to discuss this further.   Today he presents for LAAO with TEE+/-ICE guidance.       Objective      Past Medical History:  Diagnosis Date   Arthritis     Asthma      has not needed since 6/21   Cancer [facility])      prostate   Chronic venous insufficiency      varicose vein lower extremity with inflammation   Coronary artery disease 1996    two stents placed    Diabetes mellitus without complication ([place])      type 2 on metformin   GERD (gastroesophageal reflux disease)      no issues since gastric bypass surgery as stated per pt   Hyperlipidemia     Hypertension     Hypogonadism in male     MRSA  (methicillin resistant Staphylococcus aureus) infection      07/30/2008 thru 08/07/2008  abdominal abcess   Sleep apnea      on BIPAP   Stented coronary artery     Stroke ([place]) 09/07/2019    no defecits   Thrombocythemia             Past Surgical History:  Procedure Laterality Date   ANGIOPLASTY       ANGIOPLASTY        with stent 04/07/1995   ANTERIOR CERVICAL DECOMPRESSION/DISCECTOMY FUSION 4 LEVELS N/A 08/17/2017    Procedure: Anterior discectomy with fusion and plate fixation Cervical Three-Four, Four-Five, Five-Six, and Six-Seven Fusion;  Surgeon: Ditty, Kevan Ny, MD;  Location: [facility];  Service: Neurosurgery;  Laterality: N/A;  Anterior discectomy with fusion and plate fixation Cervical Three-Four, Four-Five, Five-Six, and Six-Seven Fusion    APPENDECTOMY        1966   BUBBLE STUDY   09/20/2018    Procedure: BUBBLE STUDY;  Surgeon: Josue Hector, MD;  Location: [facility] END[facility]COPY;  Service: Cardiovascular;;   [place] TEST        07/31/2011   CARDIOVERSION N/A 07/04/2021    Procedure: CARDIOVERSION;  Surgeon: Debara Pickett,  Nadean Corwin, MD;  Location: [facility] ENDOSCOPY;  Service: Cardiovascular;  Laterality: N/A;   CARPAL TUNNEL RELEASE Left     CHOLECYSTECTOMY        2006   COLONOSCOPY WITH PROPOFOL N/A 03/26/2021    Procedure: COLONOSCOPY WITH PROPOFOL;  Surgeon: Jonathon Bellows, MD;  Location: [facility] ENDOSCOPY;  Service: Gastroenterology;  Laterality: N/A;   colonscopy         08/25/2012   EYE SURGERY Bilateral      cataract   FUNCTIONAL ENDOSCOPIC SINUS SURGERY        11/10/2013   GASTRIC BYPASS        10/05/2012   HERNIA REPAIR        left inguinal 1981   INCISION AND DRAINAGE ABSCESS Right 02/26/2017    Procedure: INCISION AND DRAINAGE ABSCESS;  Surgeon: Nickie Retort, MD;  Location: [facility] ORS;  Service: Urology;  Laterality: Right;   JOINT REPLACEMENT        bilateral   left ankle surgery         05/03/2003    left carpel tunnel          09/18/1993   left knee meniscal tear         01/25/2010   left knee meniscal tear repair         05/04/1996   left rotator cuff repair         05/03/2003    LOOP RECORDER INSERTION N/A 09/20/2018    Procedure: LOOP RECORDER INSERTION;  Surgeon: Evans Lance, MD;  Location: [facility] CV LAB;  Service: Cardiovascular;  Laterality: N/A;   REPLACEMENT TOTAL KNEE BILATERAL   07/13/2015   REVERSE SHOULDER ARTHROPLASTY Left 08/30/2020    Procedure: REVERSE SHOULDER ARTHROPLASTY;  Surgeon: Justice Britain, MD;  Location: [facility] ORS;  Service: Orthopedics;  Laterality: Left;  135mn   right ankle surgery         fracture has 2 screws 07/07/1997   right carpel tunnel         05/16/1992   right shoulder replacement         01/27/2006   SCROTAL EXPLORATION Right 02/26/2017    Procedure: SCROTUM EXPLORATION;  Surgeon: BNickie Retort MD;  Location: [facility] ORS;  Service: Urology;  Laterality: Right;   TEE WITHOUT CARDIOVERSION N/A 09/20/2018    Procedure: TRANSESOPHAGEAL ECHOCARDIOGRAM (TEE);  Surgeon: NJosue Hector MD;  [facility];  Service: Cardiovascular;  Laterality: N/A;   TOTAL KNEE ARTHROPLASTY Left 07/13/2015    Procedure: LEFT TOTAL KNEE ARTHROPLASTY;  Surgeon: FGaynelle Arabian MD;  Location: [facility] OR[facility];  Service: Orthopedics;  Laterality: Left;      Current Medications: Active Medications      Current Meds  Medication Sig   acyclovir (ZOVIRAX) 400 MG tablet Take 1 tablet (400 mg total) by mouth 2 (two) times daily.   albuterol (VENTOLIN HFA) 108 (90 Base) MCG/ACT inhaler Inhale 2 puffs into the lungs every 6 (six) hours as needed for wheezing or shortness of breath.   amitriptyline (ELAVIL) 25 MG tablet Take 1 tablet (25 mg total) by mouth at bedtime.   atorvastatin (LIPITOR) 40 MG tablet Take 1 tablet (40 mg total) by mouth at bedtime.   cetirizine (ZYRTEC) 10 MG tablet Take 10 mg by mouth daily.   Cholecalciferol (VITAMIN D) 2000 units CAPS Take 2,000 Units by mouth daily.    Cyanocobalamin (B-12) 5000 MCG CAPS Take 5,000 mcg by mouth every other day.   empagliflozin (JARDIANCE) 25 MG  TABS tablet Take by mouth daily.   fluticasone (FLONASE) 50 MCG/ACT nasal spray Place 2 sprays into both nostrils daily.   glucose blood (ONETOUCH ULTRA) test strip 1 each by Other route 2 (two) times daily. Use as instructed   Lancets (ONETOUCH ULTRASOFT) lancets USE 1  TO CHECK GLUCOSE TWICE DAILY   levETIRAcetam (KEPPRA) 500 MG tablet Take 1 tablet (500 mg total) by mouth 2 (two) times daily.   metFORMIN (GLUCOPHAGE) 1000 MG tablet Take 1 tablet (1,000 mg total) by mouth 2 (two) times daily with a meal.   Multiple Vitamins-Minerals (MULTIVITAMIN PO) Take 1 tablet by mouth daily.    tamsulosin (FLOMAX) 0.4 MG CAPS capsule Take 1 capsule (0.4 mg total) by mouth at bedtime.   telmisartan (MICARDIS) 20 MG tablet Take 1 tablet (20 mg total) by mouth daily.        Allergies:   Other, Succinylsulphathiazole, Sulfamethoxazole-trimethoprim, and Tetracyclines & related    Social History         Socioeconomic History   Marital status: Married      Spouse name: Not on file   Number of children: Not on file   Years of education: Not on file   Highest education level: Some college, no degree  Occupational History      Comment: drives for [employer]   Tobacco Use   Smoking status: Former      Packs/day: 1.00      Years: 10.00      Pack years: 10.00      Types: Cigarettes      Quit date: 07/07/1984      Years since quitting: 37.4   Smokeless tobacco: Never   Tobacco comments:      Former Smoker quit 1986 07/23/2021  Vaping Use   Vaping Use: Never used  Substance and Sexual Activity   Alcohol use: No      Alcohol/week: 0.0 standard drinks   Drug use: No   Sexual activity: Yes  Other Topics Concern   Not on file  Social History Narrative   Not on file    Social Determinants of Health       Financial Resource Strain: Low Risk    Difficulty of Paying Living Expenses: Not hard  at all  Food Insecurity: No Food Insecurity   Worried About Charity fundraiser in the Last Year: Never true   [place] in the Last Year: Never true  Transportation Needs: No Transportation Needs   Lack of Transportation (Medical): No   Lack of Transportation (Non-Medical): No  Physical Activity: Sufficiently Active   Days of Exercise per Week: 4 days   Minutes of Exercise per Session: 50 min  Stress: No Stress Concern Present   Feeling of Stress : Not at all  Social Connections: Socially Integrated   Frequency of Communication with Friends and Family: Twice a week   Frequency of Social Gatherings with Friends and Family: More than three times a week   Attends Religious Services: More than 4 times per year   Active Member of [employer] or Organizations: Yes   Attends Music therapist: More than 4 times per year   Marital Status: Married      Family History: The patient's family history includes Cancer in his brother, brother, mother, and sister; Diabetes in his father and mother; Heart attack in his father and mother; Heart disease in his father and mother; Hyperlipidemia in his father and mother; Hypertension in his father and  mother; Pancreatic cancer in his father; Stroke in his father and mother. There is no history of Kidney cancer, Bladder Cancer, or Prostate cancer.   ROS:   Please see the history of present illness.    All other systems reviewed and are negative.   EKGs/Labs/Other Studies Reviewed:     The following studies were reviewed today:           Recent Labs: 10/11/2021: ALT 27; BUN 23; Creatinine, Ser 1.09; Potassium 4.2; Sodium 142; TSH 1.490 11/01/2021: Hemoglobin 12.5; Platelets 170  Recent Lipid Panel Labs (Brief)          Component Value Date/Time    CHOL 97 (L) 10/11/2021 0940    CHOL 111 01/25/2018 0815    TRIG 84 10/11/2021 0940    TRIG 150 (H) 01/25/2018 0815    HDL 53 10/11/2021 0940    CHOLHDL 2.6 09/08/2019 0350    VLDL 15  09/08/2019 0350    VLDL 30 (H) 01/25/2018 0815    LDLCALC 27 10/11/2021 0940        Physical Exam:     VS:  BP (!) 164/81   Pulse 62   Ht '5\' 9"'  (1.753 m)   Wt 201 lb (91.2 kg)   SpO2 98%   BMI 29.68 kg/m         Wt Readings from Last 3 Encounters:  12/11/21 201 lb (91.2 kg)  11/12/21 202 lb 6.4 oz (91.8 kg)  11/01/21 201 lb 12.8 oz (91.5 kg)      GEN:  Well nourished, well developed in no acute distress HEENT: Normal NECK: No JVD; No carotid bruits LYMPHATICS: No lymphadenopathy CARDIAC: RRR, no murmurs, rubs, gallops RESPIRATORY:  Clear to auscultation without rales, wheezing or rhonchi  ABDOMEN: Soft, non-tender, non-distended MUSCULOSKELETAL:  No edema; No deformity  SKIN: Warm and dry NEUROLOGIC:  Alert and oriented x 3 PSYCHIATRIC:  Normal affect          Assessment ASSESSMENT:     1. Persistent atrial fibrillation ([place])   2. Primary hypertension   3. ABLA (acute blood loss anemia)     PLAN:     In order of problems listed above:   #Persistent AF #Acute blood loss anemia   Multiple prior falls, one with large hematoma with significant blood loss into the soft tissue. Now recovered with much improved Hgb. He remains off AC with a history of multiple prior strokes.   I have seen Andrew Cobb in the office today who is being considered for a Watchman left atrial appendage closure device. I believe they will benefit from this procedure given their history of atrial fibrillation, CHA2DS2-VASc score of 6 and unadjusted ischemic stroke rate of 9.7% per year. Unfortunately, the patient is not felt to be a long term anticoagulation candidate secondary to history of falls complicated by significant bleeding/anemia. The patient's chart has been reviewed and I feel that they would be a candidate for short term oral anticoagulation after Watchman implant.    It is my belief that after undergoing a LAA closure procedure, Andrew Cobb will not need long term  anticoagulation which eliminates anticoagulation side effects and major bleeding risk.    Procedural risks for the Watchman implant have been reviewed with the patient including a 0.5% risk of stroke, <1% risk of perforation and <1% risk of device embolization.      The published clinical data on the safety and effectiveness of WATCHMAN include but are not limited to the  following: Vertell Limber DR, Mechele Claude, Sick P et al. for the PROTECT AF Investigators. Percutaneous closure of the left atrial appendage versus warfarin therapy for prevention of stroke in patients with atrial fibrillation: a randomised non-inferiority trial. Lancet 2009; 374: 534-42. Mechele Claude, Doshi SK, Abelardo Diesel D et al. on behalf of the PROTECT AF Investigators. Percutaneous Left Atrial Appendage Closure for Stroke Prophylaxis in Patients With Atrial Fibrillation 2.3-Year Follow-up of the PROTECT AF (Watchman Left Atrial Appendage System for Embolic Protection in Patients With Atrial Fibrillation) Trial. Circulation 2013; 127:720-729. - Alli O, Doshi S,  Kar S, Reddy VY, Sievert H et al. Quality of Life Assessment in the Randomized PROTECT AF (Percutaneous Closure of the Left Atrial Appendage Versus Warfarin Therapy for Prevention of Stroke in Patients With Atrial Fibrillation) Trial of Patients at Risk for Stroke With Nonvalvular Atrial Fibrillation. J Am Coll Cardiol 2013; 38:1017-5. Vertell Limber DR, Tarri Abernethy, Price M, [place], Sievert H, Doshi S, Huber K, Reddy V. Prospective randomized evaluation of the Watchman left atrial appendage Device in patients with atrial fibrillation versus long-term warfarin therapy; the PREVAIL trial. Journal of the [employer] of Cardiology, Vol. 4, No. 1, 2014, 1-11. - Kar S, Doshi SK, Sadhu A, Horton R, Osorio J et al. Primary outcome evaluation of a next-generation left atrial appendage closure device: results from the PINNACLE FLX trial. Circulation 2021;143(18)1754-1762.      After  today's visit with the patient which was dedicated solely for shared decision making visit regarding LAA closure device, the patient decided to proceed with the LAA appendage closure procedure scheduled to be done in the near future at [facility]. Prior to the procedure, I would like to obtain a gated CT scan of the chest with contrast timed for PV/LA visualization.      HAS-BLED score 4 Hypertension Yes  Abnormal renal and liver function (Dialysis, transplant, Cr >2.26 mg/dL /Cirrhosis or Bilirubin >2x Normal or AST/ALT/AP >3x Normal) No  Stroke Yes  Bleeding Yes  Labile INR (Unstable/high INR) No  Elderly (>65) Yes  Drugs or alcohol (? 8 drinks/week, anti-plt or NSAID) No    CHA2DS2-VASc Score = 6  The patient's score is based upon: CHF History: 0 HTN History: 1 Diabetes History: 1 Stroke History: 2 Vascular Disease History: 1 Age Score: 1 Gender Score: 0   Today he presents for LAAO with TEE+/-ICE guidance.          Signed, Hilton Cork. Quentin Ore, MD, [facility], [facility] 03/06/2022  Electrophysiology [facility]

## 2022-03-06 NOTE — Transfer of Care (Signed)
Immediate Anesthesia Transfer of Care Note  Patient: Andrew Cobb  Procedure(s) Performed: LEFT ATRIAL APPENDAGE OCCLUSION TRANSESOPHAGEAL ECHOCARDIOGRAM (TEE)  Patient Location: PACU  Anesthesia Type:General  Level of Consciousness: drowsy and patient cooperative  Airway & Oxygen Therapy: Patient Spontanous Breathing and Patient connected to nasal cannula oxygen  Post-op Assessment: Report given to RN, Post -op Vital signs reviewed and stable and Patient moving all extremities X 4  Post vital signs: Reviewed and stable  Last Vitals:  Vitals Value Taken Time  BP    Temp    Pulse    Resp    SpO2      Last Pain:  Vitals:   03/06/22 0947  TempSrc:   PainSc: 0-No pain         Complications: There were no known notable events for this encounter.

## 2022-03-07 ENCOUNTER — Telehealth: Payer: Self-pay | Admitting: Cardiology

## 2022-03-07 ENCOUNTER — Encounter (HOSPITAL_COMMUNITY): Payer: Self-pay | Admitting: Cardiology

## 2022-03-07 NOTE — Telephone Encounter (Signed)
  [place] VALVE TEAM   Patient contacted regarding discharge from [facility] on 03/06/22   Patient understands to follow up with provider Kathyrn Drown NP-C at the [place] office  Patient understands discharge instructions? Yes  Patient understands medications and regimen? Yes  Patient understands to bring all medications to this visit? Yes   Kathyrn Drown NP-C Structural Heart Team  Pager: [phone]

## 2022-03-13 ENCOUNTER — Encounter (INDEPENDENT_AMBULATORY_CARE_PROVIDER_SITE_OTHER): Payer: Self-pay | Admitting: Ophthalmology

## 2022-03-13 ENCOUNTER — Ambulatory Visit (INDEPENDENT_AMBULATORY_CARE_PROVIDER_SITE_OTHER): Payer: PPO | Admitting: Ophthalmology

## 2022-03-13 DIAGNOSIS — H43821 Vitreomacular adhesion, right eye: Secondary | ICD-10-CM

## 2022-03-13 DIAGNOSIS — H35721 Serous detachment of retinal pigment epithelium, right eye: Secondary | ICD-10-CM

## 2022-03-13 DIAGNOSIS — H353211 Exudative age-related macular degeneration, right eye, with active choroidal neovascularization: Secondary | ICD-10-CM

## 2022-03-13 DIAGNOSIS — H353122 Nonexudative age-related macular degeneration, left eye, intermediate dry stage: Secondary | ICD-10-CM

## 2022-03-13 MED ORDER — FARICIMAB-SVOA 6 MG/0.05ML IZ SOLN
6.0000 mg | INTRAVITREAL | Status: AC | PRN
  Administered 2022-03-13: 6 mg via INTRAVITREAL

## 2022-03-13 NOTE — Assessment & Plan Note (Signed)
We will continue to monitor.  If evidence of PVD develops, this is not a playing a role  May perform B-scan ultrasound in future to look for clear signs of PVD

## 2022-03-13 NOTE — Assessment & Plan Note (Signed)
OD, for Vabysmo injection #3.  Thus after loading is complete we will extend interval examination next from 6 to 8 weeks

## 2022-03-13 NOTE — Assessment & Plan Note (Signed)
Component of wet AMD only slightly improved using faricimab but still at 6 weeks.  We will repeat injection today (Vabysmo) and extend interval examination next 8 weeks

## 2022-03-13 NOTE — Assessment & Plan Note (Signed)
No sign of CNVM OS 

## 2022-03-13 NOTE — Progress Notes (Signed)
03/13/2022     CHIEF COMPLAINT Patient presents for  Chief Complaint  Patient presents with   Macular Degeneration      HISTORY OF PRESENT ILLNESS: Andrew Cobb is a 71 y.o. male who presents to the clinic today for:   HPI   6 weeks for DILATE OD, VABYSMO, OCT. Pt stated vision has remained stable since last visit. Pt denies new floaters and FOL.  Last edited by Silvestre Moment on 03/13/2022  9:12 AM.      Referring physician: [name], NP [place],  [place] [address]  HISTORICAL INFORMATION:   Selected notes from the MEDICAL RECORD NUMBER    Lab Results  Component Value Date   HGBA1C 7.0 (H) 01/13/2022     CURRENT MEDICATIONS: No current outpatient medications on file. (Ophthalmic Drugs)   No current facility-administered medications for this visit. (Ophthalmic Drugs)   Current Outpatient Medications (Other)  Medication Sig   acyclovir (ZOVIRAX) 400 MG tablet Take 1 tablet (400 mg total) by mouth 2 (two) times daily.   albuterol (VENTOLIN HFA) 108 (90 Base) MCG/ACT inhaler Inhale 2 puffs into the lungs every 6 (six) hours as needed for wheezing or shortness of breath.   amitriptyline (ELAVIL) 25 MG tablet Take 1 tablet (25 mg total) by mouth at bedtime.   apixaban (ELIQUIS) 5 MG TABS tablet Take 1 tablet (5 mg total) by mouth 2 (two) times daily.   atorvastatin (LIPITOR) 40 MG tablet Take 1 tablet (40 mg total) by mouth at bedtime.   cetirizine (ZYRTEC) 10 MG tablet Take 10 mg by mouth daily.   Cholecalciferol (VITAMIN D) 2000 units CAPS Take 2,000 Units by mouth daily.   Cyanocobalamin (B-12) 5000 MCG CAPS Take 5,000 mcg by mouth every other day.   empagliflozin (JARDIANCE) 25 MG TABS tablet Take 25 mg by mouth daily.   fluticasone (FLONASE) 50 MCG/ACT nasal spray Place 2 sprays into both nostrils daily. (Patient taking differently: Place 2 sprays into both nostrils daily as needed for allergies.)   glucose blood (ONETOUCH ULTRA) test strip 1 each by  Other route 2 (two) times daily. Use as instructed   Lancets (ONETOUCH ULTRASOFT) lancets USE TO CHECK BLOOD SUAGR TWICE DAILY   levETIRAcetam (KEPPRA) 500 MG tablet Take 1 tablet (500 mg total) by mouth 2 (two) times daily.   metFORMIN (GLUCOPHAGE) 1000 MG tablet Take 1 tablet (1,000 mg total) by mouth 2 (two) times daily with a meal.   Multiple Vitamins-Minerals (MULTIVITAMIN PO) Take 1 tablet by mouth daily.    tamsulosin (FLOMAX) 0.4 MG CAPS capsule Take 1 capsule (0.4 mg total) by mouth at bedtime.   telmisartan (MICARDIS) 40 MG tablet Take 1 tablet (40 mg total) by mouth daily.   Current Facility-Administered Medications (Other)  Medication Route   0.9 %  sodium chloride infusion Intravenous   0.9 %  sodium chloride infusion Intravenous      REVIEW OF SYSTEMS: ROS   Negative for: Constitutional, Gastrointestinal, Neurological, Skin, Genitourinary, Musculoskeletal, HENT, Endocrine, Cardiovascular, Eyes, Respiratory, Psychiatric, Allergic/Imm, Heme/Lymph Last edited by Silvestre Moment on 03/13/2022  9:12 AM.       ALLERGIES Allergies  Allergen Reactions   Bactrim [Sulfamethoxazole-Trimethoprim] Rash   Succinylsulphathiazole Rash   Tetracyclines & Related Rash    PAST MEDICAL HISTORY Past Medical History:  Diagnosis Date   Arthritis    Asthma    has not needed since 6/21   Cancer [facility])    prostate  Chronic venous insufficiency    varicose vein lower extremity with inflammation   Coronary artery disease 1996   two stents placed    Diabetes mellitus without complication (HCC)    type 2 on metformin   GERD (gastroesophageal reflux disease)    no issues since gastric bypass surgery as stated per pt   Hyperlipidemia    Hypertension    Hypogonadism in male    MRSA (methicillin resistant Staphylococcus aureus) infection    07/30/2008 thru 08/07/2008  abdominal abcess   Sleep apnea    on BIPAP   Stented coronary artery    Stroke ([place]) 09/07/2019   no defecits    Thrombocythemia    Past Surgical History:  Procedure Laterality Date   ANGIOPLASTY     ANGIOPLASTY     with stent 04/07/1995   ANTERIOR CERVICAL DECOMPRESSION/DISCECTOMY FUSION 4 LEVELS N/A 08/17/2017   Procedure: Anterior discectomy with fusion and plate fixation Cervical Three-Four, Four-Five, Five-Six, and Six-Seven Fusion;  Surgeon: Ditty, Kevan Ny, MD;  Location: [facility];  Service: Neurosurgery;  Laterality: N/A;  Anterior discectomy with fusion and plate fixation Cervical Three-Four, Four-Five, Five-Six, and Six-Seven Fusion    APPENDECTOMY     1966   BUBBLE STUDY  09/20/2018   Procedure: BUBBLE STUDY;  Surgeon: Josue Hector, MD;  Location: [facility] ENDOSCOPY;  Service: Cardiovascular;;   [place] TEST     07/31/2011   CARDIOVERSION N/A 07/04/2021   Procedure: CARDIOVERSION;  Surgeon: [name], MD;  Location: [place] ENDOSCOPY;  Service: Cardiovascular;  Laterality: N/A;   CARPAL TUNNEL RELEASE Left    CHOLECYSTECTOMY     2006   COLONOSCOPY WITH PROPOFOL N/A 03/26/2021   Procedure: COLONOSCOPY WITH PROPOFOL;  Surgeon: Jonathon Bellows, MD;  Location[facility];  Service: Gastroenterology;  Laterality: N/A;   colonscopy      08/25/2012   EYE SURGERY Bilateral    cataract   FUNCTIONAL ENDOSCOPIC SINUS SURGERY     11/10/2013   GASTRIC BYPASS     10/05/2012   HERNIA REPAIR     left inguinal 1981   INCISION AND DRAINAGE ABSCESS Right 02/26/2017   Procedure: INCISION AND DRAINAGE ABSCESS;  Surgeon: Nickie Retort, MD;  Location: [facility] ORS;  Service: Urology;  Laterality: Right;   JOINT REPLACEMENT     bilateral   left ankle surgery      05/03/2003    LEFT ATRIAL APPENDAGE OCCLUSION N/A 03/06/2022   Procedure: LEFT ATRIAL APPENDAGE OCCLUSION;  Surgeon: Vickie Epley, MD;  Location: [place] CV LAB;  Service: Cardiovascular;  Laterality: N/A;   left carpel tunnel      09/18/1993   left knee meniscal tear      01/25/2010    left knee meniscal tear repair      05/04/1996   left rotator cuff repair      05/03/2003    LOOP RECORDER INSERTION N/A 09/20/2018   Procedure: LOOP RECORDER INSERTION;  Surgeon: Evans Lance, MD;  Location: [place] CV LAB;  Service: Cardiovascular;  Laterality: N/A;   REPLACEMENT TOTAL KNEE BILATERAL  07/13/2015   REVERSE SHOULDER ARTHROPLASTY Left 08/30/2020   Procedure: REVERSE SHOULDER ARTHROPLASTY;  Surgeon: Justice Britain, MD;  Location: [facility] ORS;  Service: Orthopedics;  Laterality: Left;  176mn   right ankle surgery      fracture has 2 screws 07/07/1997   right carpel tunnel      05/16/1992   right shoulder replacement  01/27/2006   SCROTAL EXPLORATION Right 02/26/2017   Procedure: SCROTUM EXPLORATION;  Surgeon: Nickie Retort, MD;  Location: [facility] ORS;  Service: Urology;  Laterality: Right;   TEE WITHOUT CARDIOVERSION N/A 09/20/2018   Procedure: TRANSESOPHAGEAL ECHOCARDIOGRAM (TEE);  Surgeon: Josue Hector, MD;  Location: [facility] ENDOSCOPY;  Service: Cardiovascular;  Laterality: N/A;   TEE WITHOUT CARDIOVERSION N/A 03/06/2022   Procedure: TRANSESOPHAGEAL ECHOCARDIOGRAM (TEE);  Surgeon: Vickie Epley, MD;  Location: [place] CV LAB;  Service: Cardiovascular;  Laterality: N/A;   TOTAL KNEE ARTHROPLASTY Left 07/13/2015   Procedure: LEFT TOTAL KNEE ARTHROPLASTY;  Surgeon: Gaynelle Arabian, MD;  Location: [facility] ORS;  Service: Orthopedics;  Laterality: Left;    FAMILY HISTORY Family History  Problem Relation Age of Onset   Cancer Mother        pancreatic   Diabetes Mother    Stroke Mother    Heart disease Mother    Hyperlipidemia Mother    Hypertension Mother    Heart attack Mother    Heart disease Father    Stroke Father    Diabetes Father    Hypertension Father    Heart attack Father    Hyperlipidemia Father    Pancreatic cancer Father    Cancer Sister    Cancer Brother        lung   Cancer Brother    Kidney cancer Neg Hx    Bladder Cancer Neg Hx    Prostate  cancer Neg Hx     SOCIAL HISTORY Social History   Tobacco Use   Smoking status: Former    Packs/day: 1.00    Years: 10.00    Total pack years: 10.00    Types: Cigarettes    Quit date: 07/07/1984    Years since quitting: 37.7   Smokeless tobacco: Never   Tobacco comments:    Former Smoker quit 1986 07/23/2021  Vaping Use   Vaping Use: Never used  Substance Use Topics   Alcohol use: No    Alcohol/week: 0.0 standard drinks of alcohol   Drug use: No         OPHTHALMIC EXAM:  Base Eye Exam     Visual Acuity (ETDRS)       Right Left   Dist [place] 20/30 20/20   Dist ph [place] 20/25 -1          Tonometry (Tonopen, 9:17 AM)       Right Left   Pressure 15 16         Pupils       Pupils APD   Right PERRL None   Left PERRL None         Visual Fields       Left Right    Full Full         Extraocular Movement       Right Left    Full, Ortho Full, Ortho         Neuro/Psych     Oriented x3: Yes   Mood/Affect: Normal         Dilation     Right eye: 2.5% Phenylephrine, 1.0% Mydriacyl @ 9:16 AM           Slit Lamp and Fundus Exam     External Exam       Right Left   External Normal Normal         Slit Lamp Exam       Right Left   Lids/Lashes Normal Normal  Conjunctiva/Sclera White and quiet White and quiet   Cornea Clear Clear   Anterior Chamber Deep and quiet Deep and quiet   Iris Round and reactive Round and reactive   Lens Posterior chamber intraocular lens Posterior chamber intraocular lens   Anterior Vitreous Normal Normal         Fundus Exam       Right Left   Posterior Vitreous Posterior vitreous detachment, Central vitreous floaters    Disc Normal    C/D Ratio 0.6    Macula Hard drusen, no exudates, Macular thickening, Soft drusen, Pigmented atrophy, Retinal pigment epithelial mottling, Retinal pigment epithelial detachment    Vessels Normal, , no DR    Periphery Normal             IMAGING AND PROCEDURES   Imaging and Procedures for 03/13/22  OCT, Retina - OU - Both Eyes       Right Eye Quality was good. Scan locations included subfoveal. Central Foveal Thickness: 337. Progression has improved. Findings include no IRF, abnormal foveal contour, pigment epithelial detachment, subretinal fluid, vitreomacular adhesion .   Left Eye Quality was good. Scan locations included subfoveal. Central Foveal Thickness: 290. Progression has been stable. Findings include no IRF, no SRF, abnormal foveal contour, retinal drusen .   Notes Today OD at 6 week interval, small change with increased serous retinal detachment adjacent to the vascularized pigment epithelial detachment nasal to the FAZ.  We will repeat injection today continue Vabysmo today at 6-week interval examination again in 8 weeks     Intravitreal Injection, Pharmacologic Agent - OD - Right Eye       Time Out 03/13/2022. 10:09 AM. Confirmed correct patient, procedure, site, and patient consented.   Anesthesia Topical anesthesia was used. Anesthetic medications included Lidocaine 4%.   Procedure Preparation included 5% betadine to ocular surface, 10% betadine to eyelids, Tobramycin 0.3%. A 30 gauge needle was used.   Injection: 6 mg faricimab-svoa 6 MG/0.05ML   Route: Intravitreal, Site: Right Eye   NDC: [phone], Lot: [id number], Expiration date: 12/06/2023, Waste: 0 mL   Post-op Post injection exam found visual acuity of at least counting fingers. The patient tolerated the procedure well. There were no complications. The patient received written and verbal post procedure care education. Post injection medications included ocuflox.              ASSESSMENT/PLAN:  Vitreomacular adhesion of right eye We will continue to monitor.  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  We will repeat injection today (Vabysmo) and extend interval examination next 8 weeks  Intermediate stage nonexudative age-related macular degeneration of left eye No sign of CNVM OS  Exudative age-related macular degeneration of right eye with active choroidal neovascularization (HCC) OD, for Vabysmo injection #3.  Thus after loading is complete we will extend interval examination next from 6 to 8 weeks     ICD-10-CM   1. Exudative age-related macular degeneration of right eye with active choroidal neovascularization (HCC)  H35.3211 OCT, Retina - OU - Both Eyes    Intravitreal Injection, Pharmacologic Agent - OD - Right Eye    faricimab-svoa (VABYSMO) '6mg'$ /0.68m intravitreal injection    2. Vitreomacular adhesion of right eye  H43.821  3. Serous detachment of retinal pigment epithelium of right eye  H35.721     4. Intermediate stage nonexudative age-related macular degeneration of left eye  H35.3122       1.  2.  3.  Ophthalmic Meds Ordered this visit:  Meds ordered this encounter  Medications   faricimab-svoa (VABYSMO) '6mg'$ /0.5m intravitreal injection       Return in about 8 weeks (around 05/08/2022) for dilate, OD, VABYSMO OCT.  There are no Patient Instructions on file for this visit.   Explained the diagnoses, plan, and follow up with the patient and they expressed understanding.  Patient expressed understanding of the importance of proper follow up care.   GClent DemarkRankin M.D. Diseases & Surgery of the Retina and Vitreous Retina & Diabetic EJohnsburg09/07/23     Abbreviations: M myopia (nearsighted); A astigmatism; H hyperopia (farsighted); P presbyopia; Mrx spectacle prescription;  CTL contact lenses; OD right eye; OS left eye; OU both eyes  XT exotropia; ET esotropia; PEK punctate epithelial keratitis; PEE punctate epithelial erosions; DES dry eye syndrome; MGD  meibomian gland dysfunction; ATs artificial tears; PFAT's preservative free artificial tears; NSt. Petersburgnuclear sclerotic cataract; PSC posterior subcapsular cataract; ERM epi-retinal membrane; PVD posterior vitreous detachment; RD retinal detachment; DM diabetes mellitus; DR diabetic retinopathy; NPDR non-proliferative diabetic retinopathy; PDR proliferative diabetic retinopathy; CSME clinically significant macular edema; DME diabetic macular edema; dbh dot blot hemorrhages; CWS cotton wool spot; POAG primary open angle glaucoma; C/D cup-to-disc ratio; HVF humphrey visual field; GVF goldmann visual field; OCT optical coherence tomography; IOP intraocular pressure; BRVO Branch retinal vein occlusion; CRVO central retinal vein occlusion; CRAO central retinal artery occlusion; BRAO branch retinal artery occlusion; RT retinal tear; SB scleral buckle; PPV pars plana vitrectomy; VH Vitreous hemorrhage; PRP panretinal laser photocoagulation; IVK intravitreal kenalog; VMT vitreomacular traction; MH Macular hole;  NVD neovascularization of the disc; NVE neovascularization elsewhere; AREDS age related eye disease study; ARMD age related macular degeneration; POAG primary open angle glaucoma; EBMD epithelial/anterior basement membrane dystrophy; ACIOL anterior chamber intraocular lens; IOL intraocular lens; PCIOL posterior chamber intraocular lens; Phaco/IOL phacoemulsification with intraocular lens placement; PCentral Valleyphotorefractive keratectomy; LASIK laser assisted in situ keratomileusis; HTN hypertension; DM diabetes mellitus; COPD chronic obstructive pulmonary disease

## 2022-03-14 NOTE — Progress Notes (Signed)
[facility]                                     Cardiology Office Note:    Date:  03/20/2022   ID:  Andrew Cobb, DOB [birth year], MRN [id number]  PCP:  [name], NP  [facility] Cardiologist:  None  [facility] Electrophysiologist:  Cristopher Peru, MD   Referring MD: [name], NP   Chief Complaint  Patient presents with   Follow-up    Post aborted Watchman    History of Present Illness:    Andrew Cobb is a 71 y.o. male with a hx of CAD s/p RCA PCI, DM2, OSA on CPAP, HTN, HLD, CVA 2020, and persistent atrial fibrillation who presented to [facility] for scheduled Watchman implant.    Andrew Cobb was initially diagnosed with atrial fibrillation by ILR 05/07/21 which was implanted for cryptogenic CVA in 09/2018. He underwent DCCV 07/04/21. He has been rather asymptomatic with his AF. He has a CHADS2VASC score of 6. In 07/2021 he was taken off his anticoagulation after sustaining a fall complicated by left hip hematoma. His Hb at that time dropped 4 points and he was transfused with PRBCs. He was referred to Dr. Quentin Ore for potential Watchman implant. CT imaging performed 01/06/22 showed anatomy suitable for implant.   The patient was admitted and underwent attempted left atrial appendage occlusive device placement however this was unsuccessful and the procedure was aborted. He was resumed on Eliquis '5mg'$  BID the evening of the procedure.   He is here today with his wife. He is asking about potential referral to [facility] for alternate LAAO closure device. I discussed the case with Dr. Quentin Ore who agrees. He is otherwise well with no complaints today. Access site is stable with no hematoma or bleeding. He denies chest pain, palpitations, SOB, LE edema, dizziness, or syncope.  Past Medical History:  Diagnosis Date   Arthritis    Asthma    has not needed since 6/21   Cancer [facility])    prostate   Chronic venous insufficiency    varicose vein lower extremity with  inflammation   Coronary artery disease 1996   two stents placed    Diabetes mellitus without complication ([place])    type 2 on metformin   GERD (gastroesophageal reflux disease)    no issues since gastric bypass surgery as stated per pt   Hyperlipidemia    Hypertension    Hypogonadism in male    MRSA (methicillin resistant Staphylococcus aureus) infection    07/30/2008 thru 08/07/2008  abdominal abcess   Sleep apnea    on BIPAP   Stented coronary artery    Stroke ([place]) 09/07/2019   no defecits   Thrombocythemia     Past Surgical History:  Procedure Laterality Date   ANGIOPLASTY     ANGIOPLASTY     with stent 04/07/1995   ANTERIOR CERVICAL DECOMPRESSION/DISCECTOMY FUSION 4 LEVELS N/A 08/17/2017   Procedure: Anterior discectomy with fusion and plate fixation Cervical Three-Four, Four-Five, Five-Six, and Six-Seven Fusion;  Surgeon: Ditty, Kevan Ny, MD;  Location: [place];  Service: Neurosurgery;  Laterality: N/A;  Anterior discectomy with fusion and plate fixation Cervical Three-Four, Four-Five, Five-Six, and Six-Seven Fusion    APPENDECTOMY     1966   BUBBLE STUDY  09/20/2018   Procedure: BUBBLE STUDY;  Surgeon: Josue Hector, MD;  Location: [place] [facility];  Service: Cardiovascular;;   CARDIAC CATHETERIZATION  1996   CARDIOVASCULAR STRESS TEST     07/31/2011   CARDIOVERSION N/A 07/04/2021   Procedure: CARDIOVERSION;  Surgeon: [name], MD;  Location: [place];  Service: Cardiovascular;  Laterality: N/A;   CARPAL TUNNEL RELEASE Left    CHOLECYSTECTOMY     2006   COLONOSCOPY WITH PROPOFOL N/A 03/26/2021   Procedure: COLONOSCOPY WITH PROPOFOL;  Surgeon: Jonathon Bellows, MD;  Location[facility];  Service: Gastroenterology;  Laterality: N/A;   colonscopy      08/25/2012   EYE SURGERY Bilateral    cataract   FUNCTIONAL ENDOSCOPIC SINUS SURGERY     11/10/2013   GASTRIC BYPASS     10/05/2012   HERNIA REPAIR     left inguinal 1981   INCISION AND DRAINAGE ABSCESS Right  02/26/2017   Procedure: INCISION AND DRAINAGE ABSCESS;  Surgeon: Nickie Retort, MD;  Location: [facility];  Service: Urology;  Laterality: Right;   JOINT REPLACEMENT     bilateral   left ankle surgery      05/03/2003    LEFT ATRIAL APPENDAGE OCCLUSION N/A 03/06/2022   Procedure: LEFT ATRIAL APPENDAGE OCCLUSION;  Surgeon: Vickie Epley, MD;  Location: [place] [facility];  Service: Cardiovascular;  Laterality: N/A;   left carpel tunnel      09/18/1993   left knee meniscal tear      01/25/2010   left knee meniscal tear repair      05/04/1996   left rotator cuff repair      05/03/2003    LOOP RECORDER INSERTION N/A 09/20/2018   Procedure: LOOP RECORDER INSERTION;  Surgeon: Evans Lance, MD;  Location: [place] [facility];  Service: Cardiovascular;  Laterality: N/A;   REPLACEMENT TOTAL KNEE BILATERAL  07/13/2015   REVERSE SHOULDER ARTHROPLASTY Left 08/30/2020   Procedure: REVERSE SHOULDER ARTHROPLASTY;  Surgeon: Justice Britain, MD;  Location: [facility];  Service: Orthopedics;  Laterality: Left;  186mn   right ankle surgery      fracture has 2 screws 07/07/1997   right carpel tunnel      05/16/1992   right shoulder replacement      01/27/2006   SCROTAL EXPLORATION Right 02/26/2017   Procedure: SCROTUM EXPLORATION;  Surgeon: BNickie Retort MD;  Location: [facility];  Service: Urology;  Laterality: Right;   TEE WITHOUT CARDIOVERSION N/A 09/20/2018   Procedure: TRANSESOPHAGEAL ECHOCARDIOGRAM (TEE);  Surgeon: NJosue Hector MD;  [facility];  Service: Cardiovascular;  Laterality: N/A;   TEE WITHOUT CARDIOVERSION N/A 03/06/2022   Procedure: TRANSESOPHAGEAL ECHOCARDIOGRAM (TEE);  Surgeon: LVickie Epley MD;  [facility];  Service: Cardiovascular;  Laterality: N/A;   TOTAL KNEE ARTHROPLASTY Left 07/13/2015   Procedure: LEFT TOTAL KNEE ARTHROPLASTY;  Surgeon: FGaynelle Arabian MD;  Location[facility];  Service: Orthopedics;  Laterality: Left;    Current  Medications: Current Meds  Medication Sig   acyclovir (ZOVIRAX) 400 MG tablet Take 1 tablet (400 mg total) by mouth 2 (two) times daily.   albuterol (VENTOLIN HFA) 108 (90 Base) MCG/ACT inhaler Inhale 2 puffs into the lungs every 6 (six) hours as needed for wheezing or shortness of breath.   amitriptyline (ELAVIL) 25 MG tablet Take 1 tablet (25 mg total) by mouth at bedtime.   apixaban (ELIQUIS) 5 MG TABS tablet Take 1 tablet (5 mg total) by mouth 2 (two) times daily.   atorvastatin (LIPITOR) 40 MG tablet Take 1 tablet (40 mg total)  by mouth at bedtime.   cetirizine (ZYRTEC) 10 MG tablet Take 10 mg by mouth daily.   Cholecalciferol (VITAMIN D) 2000 units CAPS Take 2,000 Units by mouth daily.   Cyanocobalamin (B-12) 5000 MCG CAPS Take 5,000 mcg by mouth every other day.   empagliflozin (JARDIANCE) 25 MG TABS tablet Take 25 mg by mouth daily.   fluticasone (FLONASE) 50 MCG/ACT nasal spray Place 2 sprays into both nostrils daily. (Patient taking differently: Place 2 sprays into both nostrils daily as needed for allergies.)   glucose blood (ONETOUCH ULTRA) test strip 1 each by Other route 2 (two) times daily. Use as instructed   Lancets (ONETOUCH ULTRASOFT) lancets USE TO CHECK BLOOD SUAGR TWICE DAILY   levETIRAcetam (KEPPRA) 500 MG tablet Take 1 tablet (500 mg total) by mouth 2 (two) times daily.   metFORMIN (GLUCOPHAGE) 1000 MG tablet Take 1 tablet (1,000 mg total) by mouth 2 (two) times daily with a meal.   Multiple Vitamins-Minerals (MULTIVITAMIN PO) Take 1 tablet by mouth daily.    tamsulosin (FLOMAX) 0.4 MG CAPS capsule Take 1 capsule (0.4 mg total) by mouth at bedtime.   telmisartan (MICARDIS) 40 MG tablet Take 1 tablet (40 mg total) by mouth daily.   Current Facility-Administered Medications for the 03/17/22 encounter (Office Visit) with [facility]  Medication   0.9 %  sodium chloride infusion   0.9 %  sodium chloride infusion     Allergies:   Bactrim  [sulfamethoxazole-trimethoprim], Succinylsulphathiazole, and Tetracyclines & related   Social History   Socioeconomic History   Marital status: Married    Spouse name: Not on file   Number of children: Not on file   Years of education: Not on file   Highest education level: Some college, no degree  Occupational History    Comment: drives for [employer]   Tobacco Use   Smoking status: Former    Packs/day: 1.00    Years: 10.00    Total pack years: 10.00    Types: Cigarettes    Quit date: 07/07/1984    Years since quitting: 37.7   Smokeless tobacco: Never   Tobacco comments:    Former Smoker quit 1986 07/23/2021  Vaping Use   Vaping Use: Never used  Substance and Sexual Activity   Alcohol use: No    Alcohol/week: 0.0 standard drinks of alcohol   Drug use: No   Sexual activity: Yes  Other Topics Concern   Not on file  Social History Narrative   Not on file   Social Determinants of Health   Financial Resource Strain: Low Risk  (07/24/2021)   Overall Financial Resource Strain (CARDIA)    Difficulty of Paying Living Expenses: Not hard at all  Food Insecurity: No Food Insecurity (07/24/2021)   Hunger Vital Sign    Worried About Running Out of Food in the Last Year: Never true    Ran Out of Food in the Last Year: Never true  Transportation Needs: No Transportation Needs (07/24/2021)   PRAPARE - Hydrologist (Medical): No    Lack of Transportation (Non-Medical): No  Physical Activity: Sufficiently Active (07/24/2021)   Exercise Vital Sign    Days of Exercise per Week: 4 days    Minutes of Exercise per Session: 50 min  Stress: No Stress Concern Present (07/24/2021)   [place]    Feeling of Stress : Not at all  Social Connections: [place] (07/24/2021)  Social Licensed conveyancer [NHANES]    Frequency of Communication with Friends and Family: Twice a week     Frequency of Social Gatherings with Friends and Family: More than three times a week    Attends Religious Services: More than 4 times per year    Active Member of [employer] or Organizations: Yes    Attends Music therapist: More than 4 times per year    Marital Status: Married     Family History: The patient's family history includes Cancer in his brother, brother, mother, and sister; Diabetes in his father and mother; Heart attack in his father and mother; Heart disease in his father and mother; Hyperlipidemia in his father and mother; Hypertension in his father and mother; Pancreatic cancer in his father; Stroke in his father and mother. There is no history of Kidney cancer, Bladder Cancer, or Prostate cancer.  ROS:   Please see the history of present illness.    All other systems reviewed and are negative.  EKGs/Labs/Other Studies Reviewed:    The following studies were reviewed today:  Attempted Watchman implant 03/06/22:   Procedures This Admission:  Transeptal Puncture Intra-procedural TEE which showed no LAA thrombus Procedure aborted due to poor Watchman anatomy on intra operative imaging   EKG:  EKG is not ordered today.    Recent Labs: 10/11/2021: ALT 27; TSH 1.490 02/12/2022: BUN 28; Creatinine, Ser 1.16; Hemoglobin 14.1; Platelets 142; Potassium 4.6; Sodium 140   Recent Lipid Panel    Component Value Date/Time   CHOL 97 (L) 10/11/2021 0940   CHOL 111 01/25/2018 0815   TRIG 84 10/11/2021 0940   TRIG 150 (H) 01/25/2018 0815   HDL 53 10/11/2021 0940   CHOLHDL 2.6 09/08/2019 0350   VLDL 15 09/08/2019 0350   VLDL 30 (H) 01/25/2018 0815   LDLCALC 27 10/11/2021 0940   Physical Exam:    VS:  BP 124/60   Pulse 73   Ht '5\' 10"'$  (1.778 m)   Wt 202 lb 9.6 oz (91.9 kg)   SpO2 97%   BMI 29.07 kg/m     Wt Readings from Last 3 Encounters:  03/17/22 202 lb 9.6 oz (91.9 kg)  03/06/22 200 lb (90.7 kg)  02/12/22 205 lb 6.4 oz (93.2 kg)    General: Well developed,  well nourished, NAD Neck: Negative for carotid bruits. No JVD Lungs:Clear to ausculation bilaterally.  Cardiovascular: RRR with S1 S2. No murmurs Extremities: No edema. Neuro: Alert and oriented. No focal deficits. No facial asymmetry. MAE spontaneously. Psych: Responds to questions appropriately with normal affect.    ASSESSMENT/PLAN:    Persistent atrial fibrillation: Patient admitted and underwent attempted left atrial appendage occlusive device placement however this was unsuccessful and the procedure was aborted. He was restarted on Eliquis '5mg'$  BID and is tolerating this well. We will refer to [facility] for possible alternate device placement more suitable with his anatomy. We have made this referral today.   CAD s/p RCA PCI: Denies chest pain or other anginal symptoms. Continue current regimen.   HTN: Stable today at 124/60. No changes needed at this time.   HLD: Last LDL, 40. Continue current regimen   CVA 2020: No new neuro changes.   Incidental findings: Sub-pleural reticulation and signs of septal thickening with parenchymal scarring, arc like septal thickening in the subpleural RIGHT and LEFT chest RIGHT greater than LEFT. Findings favor post infectious or inflammatory fibrosis perhaps related to prior COVID-19 infection or viral pneumonia. Correlate clinically. He  has no SOB or worrisome findings on exam today. No need for further follow up at this time.    Medication Adjustments/Labs and Tests Ordered: Current medicines are reviewed at length with the patient today.  Concerns regarding medicines are outlined above.  Orders Placed This Encounter  Procedures   Ambulatory referral to Cardiac Electrophysiology   No orders of the defined types were placed in this encounter.   Patient Instructions  Medication Instructions:  Your physician recommends that you continue on your current medications as directed. Please refer to the Current Medication list given to you today.  *If you  need a refill on your cardiac medications before your next appointment, please call your pharmacy*   Lab Work: NONE If you have labs (blood work) drawn today and your tests are completely normal, you will receive your results only by: [place] (if you have MyChart) OR A paper copy in the mail If you have any lab test that is abnormal or we need to change your treatment, we will call you to review the results.   Testing/Procedures: NONE   Follow-Up: At [facility], you and your health needs are our priority.  As part of our continuing mission to provide you with exceptional heart care, we have created designated Provider Care Teams.  These Care Teams include your primary Cardiologist (physician) and Advanced Practice Providers (APPs -  Physician Assistants and Nurse Practitioners) who all work together to provide you with the care you need, when you need it.  We recommend signing up for the patient portal called "MyChart".  Sign up information is provided on this After Visit Summary.  MyChart is used to connect with patients for Virtual Visits (Telemedicine).  Patients are able to view lab/test results, encounter notes, upcoming appointments, etc.  Non-urgent messages can be sent to your provider as well.   To learn more about what you can do with MyChart, go to [email or web address].    Your next appointment:   6 month(s)  The format for your next appointment:   In Person  Provider:   You may see Cristopher Peru, MD or one of the following Advanced Practice Providers on your designated Care Team:   Tommye Standard, [place] "Oda Kilts, [place]   Important Information About Sugar         Signed, Kathyrn Drown, NP  03/20/2022 2:37 PM    [facility]

## 2022-03-17 ENCOUNTER — Ambulatory Visit: Payer: PPO | Attending: Cardiology | Admitting: Cardiology

## 2022-03-17 VITALS — BP 124/60 | HR 73 | Ht 70.0 in | Wt 202.6 lb

## 2022-03-17 DIAGNOSIS — I10 Essential (primary) hypertension: Secondary | ICD-10-CM

## 2022-03-17 DIAGNOSIS — D6869 Other thrombophilia: Secondary | ICD-10-CM

## 2022-03-17 DIAGNOSIS — I2510 Atherosclerotic heart disease of native coronary artery without angina pectoris: Secondary | ICD-10-CM | POA: Diagnosis not present

## 2022-03-17 DIAGNOSIS — I4891 Unspecified atrial fibrillation: Secondary | ICD-10-CM | POA: Diagnosis not present

## 2022-03-17 DIAGNOSIS — I4819 Other persistent atrial fibrillation: Secondary | ICD-10-CM

## 2022-03-17 NOTE — Patient Instructions (Signed)
Medication Instructions:  Your physician recommends that you continue on your current medications as directed. Please refer to the Current Medication list given to you today.  *If you need a refill on your cardiac medications before your next appointment, please call your pharmacy*   Lab Work: NONE If you have labs (blood work) drawn today and your tests are completely normal, you will receive your results only by: [place] (if you have MyChart) OR A paper copy in the mail If you have any lab test that is abnormal or we need to change your treatment, we will call you to review the results.   Testing/Procedures: NONE   Follow-Up: At [facility], you and your health needs are our priority.  As part of our continuing mission to provide you with exceptional heart care, we have created designated Provider Care Teams.  These Care Teams include your primary Cardiologist (physician) and Advanced Practice Providers (APPs -  Physician Assistants and Nurse Practitioners) who all work together to provide you with the care you need, when you need it.  We recommend signing up for the patient portal called "MyChart".  Sign up information is provided on this After Visit Summary.  MyChart is used to connect with patients for Virtual Visits (Telemedicine).  Patients are able to view lab/test results, encounter notes, upcoming appointments, etc.  Non-urgent messages can be sent to your provider as well.   To learn more about what you can do with MyChart, go to [email or web address].    Your next appointment:   6 month(s)  The format for your next appointment:   In Person  Provider:   You may see Cristopher Peru, MD or one of the following Advanced Practice Providers on your designated Care Team:   Tommye Standard, [place] Legrand Como "Jonni Sanger" Chalmers Cater, [place]   Important Information About Sugar

## 2022-03-21 ENCOUNTER — Telehealth: Payer: Self-pay | Admitting: Neurology

## 2022-03-21 NOTE — Telephone Encounter (Signed)
Pt's wife called stating that they are needing an updated referral sent to the DME. States that the DME informed her that the last one expired in August. Please advise.

## 2022-03-24 NOTE — Telephone Encounter (Signed)
[place], Chalmers Cater, Lourena Simmonds, RN; [place], Eleonore Chiquito, Terrill Mohr, [facility], [place],   Patient received his pap setup on 08-27-21. We are in need of the 90 day f20ffollow up notes for insurance to continue to pay. Patient needs appt for cpap review per notes.   Thank you,   BDemetrius Charity
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
## 2022-03-24 NOTE — Telephone Encounter (Signed)
I reached out to [facility] to have them look into this. Patient was supposed to get a new machine back in February as well but I don't see where he ever had an initial follow-up visit for the machine.

## 2022-03-24 NOTE — Telephone Encounter (Signed)
I let [employer] know pt will be seen tomorrow.

## 2022-03-25 ENCOUNTER — Encounter: Payer: Self-pay | Admitting: Adult Health

## 2022-03-25 ENCOUNTER — Ambulatory Visit: Payer: PPO | Admitting: Adult Health

## 2022-03-25 VITALS — BP 162/73 | HR 56 | Ht 70.0 in | Wt 206.2 lb

## 2022-03-25 DIAGNOSIS — G4733 Obstructive sleep apnea (adult) (pediatric): Secondary | ICD-10-CM | POA: Diagnosis not present

## 2022-03-25 DIAGNOSIS — I69398 Other sequelae of cerebral infarction: Secondary | ICD-10-CM

## 2022-03-25 DIAGNOSIS — Z9989 Dependence on other enabling machines and devices: Secondary | ICD-10-CM | POA: Diagnosis not present

## 2022-03-25 DIAGNOSIS — R569 Unspecified convulsions: Secondary | ICD-10-CM | POA: Diagnosis not present

## 2022-03-25 DIAGNOSIS — I639 Cerebral infarction, unspecified: Secondary | ICD-10-CM

## 2022-03-25 NOTE — Patient Instructions (Addendum)
It was good to see you again today! No changes mad today  Your Plan:  Continue Keppra 500 mg twice daily for seizure prevention  Continue nightly use of CPAP for adequate sleep apnea management.  Continue to follow with your DME company [employer] for any needed supplies or CPAP related concerns.  Continue current treatment plan for secondary stroke prevention measures and close follow-up with PCP and cardiology for aggressive stroke risk factor management    Follow-up in 1 year or call earlier if needed     Thank you for coming to see [place] at [facility]. I hope we have been able to provide you high quality care today.  You may receive a patient satisfaction survey over the next few weeks. We would appreciate your feedback and comments so that we may continue to improve ourselves and the health of our patients.

## 2022-03-25 NOTE — Progress Notes (Signed)
Orders and OV notes faxed to [facility] as rq per apt notes. Confirmation received.

## 2022-03-28 ENCOUNTER — Telehealth: Payer: Self-pay

## 2022-03-28 DIAGNOSIS — G40909 Epilepsy, unspecified, not intractable, without status epilepticus: Secondary | ICD-10-CM | POA: Diagnosis not present

## 2022-03-28 DIAGNOSIS — Z8546 Personal history of malignant neoplasm of prostate: Secondary | ICD-10-CM | POA: Diagnosis not present

## 2022-03-28 DIAGNOSIS — Z7984 Long term (current) use of oral hypoglycemic drugs: Secondary | ICD-10-CM | POA: Diagnosis not present

## 2022-03-28 DIAGNOSIS — E1159 Type 2 diabetes mellitus with other circulatory complications: Secondary | ICD-10-CM | POA: Diagnosis not present

## 2022-03-28 DIAGNOSIS — Z9221 Personal history of antineoplastic chemotherapy: Secondary | ICD-10-CM | POA: Diagnosis not present

## 2022-03-28 DIAGNOSIS — I10 Essential (primary) hypertension: Secondary | ICD-10-CM | POA: Diagnosis not present

## 2022-03-28 NOTE — Progress Notes (Signed)
Carelink Summary Report / Loop Recorder 

## 2022-03-28 NOTE — Telephone Encounter (Signed)
ILR reached RRT 03/27/22. Called and spoke to patient and wife, advised ILR battery is no longer working. Discussed options to leave device in or have it removed. Patient is unsure at this time but will call back if he wants it explanted.   -Marked "I" in [employer]. -Canceled remotes. -Discontinued from [place].

## 2022-03-31 ENCOUNTER — Other Ambulatory Visit: Payer: Self-pay | Admitting: Nurse Practitioner

## 2022-03-31 NOTE — Telephone Encounter (Signed)
Pt called back and also needs refill for atorvastatin (LIPITOR) 40 MG tablet/ send to [facility] order pharmacy

## 2022-03-31 NOTE — Telephone Encounter (Signed)
Medication Refill - Medication: telmisartan (MICARDIS) 40 MG tablet [[id number]]   Has the patient contacted their pharmacy? No. (Agent: If no, request that the patient contact the pharmacy for the refill. If patient does not wish to contact the pharmacy document the reason why and proceed with request.) (Agent: If yes, when and what did the pharmacy advise?)  Preferred Pharmacy (with phone number or street name):  [facility]) - [place], [place]  [place] [address]  Phone: [phone] Fax: [phone]  Hours: Not open 24 hours   Has the patient been seen for an appointment in the last year OR does the patient have an upcoming appointment? Yes.    Agent: Please be advised that RX refills may take up to 3 business days. We ask that you follow-up with your pharmacy.

## 2022-04-01 MED ORDER — ATORVASTATIN CALCIUM 40 MG PO TABS: 40.0000 mg | ORAL_TABLET | Freq: Every day | ORAL | 0 refills | Status: DC

## 2022-04-01 NOTE — Telephone Encounter (Signed)
Unable to refill per protocol, last refill by provider 12/17/21 for 90 and 4 RF. Receipt confirmed by pharmacy (12/17/2021 11:49 ). Pharmacy comment: not to fill until patient calls. Will refuse request.  Requested Prescriptions  Pending Prescriptions Disp Refills  . telmisartan (MICARDIS) 40 MG tablet 90 tablet 4    Sig: Take 1 tablet (40 mg total) by mouth daily.     Cardiovascular:  Angiotensin Receptor Blockers Failed - 03/31/2022 12:29 PM      Failed - Last BP in normal range    BP Readings from Last 1 Encounters:  03/25/22 (!) 162/73         Passed - Cr in normal range and within 180 days    Creatinine  Date Value Ref Range Status  07/29/2011 0.99 0.60 - 1.30 mg/dL Final   Creatinine, Ser  Date Value Ref Range Status  02/12/2022 1.16 0.76 - 1.27 mg/dL Final         Passed - K in normal range and within 180 days    Potassium  Date Value Ref Range Status  02/12/2022 4.6 3.5 - 5.2 mmol/L Final  07/29/2011 3.8 3.5 - 5.1 mmol/L Final         Passed - Patient is not pregnant      Passed - Valid encounter within last 6 months    Recent Outpatient Visits          2 months ago Uncontrolled type 2 diabetes mellitus with hyperglycemia, without long-term current use of insulin ([place])   [place], Jasper T, NP   3 months ago Hypertension associated with diabetes ([place])   [place] [name], Jolene T, NP   4 months ago Hypertension associated with diabetes ([place])   [place], Jolene T, NP   5 months ago Persistent atrial fibrillation ([place])   [place], Jolene T, NP   5 months ago Uncontrolled type 2 diabetes mellitus with hyperglycemia, without long-term current use of insulin ([place])   [place], [place] T, NP      Future Appointments            In 2 weeks [name], [name], NP [facility]           Signed Prescriptions Disp Refills   atorvastatin  (LIPITOR) 40 MG tablet 90 tablet 0    Sig: Take 1 tablet (40 mg total) by mouth at bedtime.     Cardiovascular:  Antilipid - Statins Failed - 03/31/2022 12:29 PM      Failed - Lipid Panel in normal range within the last 12 months    Cholesterol, Total  Date Value Ref Range Status  10/11/2021 97 (L) 100 - 199 mg/dL Final   Cholesterol Piccolo, Waived  Date Value Ref Range Status  01/25/2018 111 <200 mg/dL Final    Comment:                            Desirable                <200                         Borderline High      200- 239                         High                     >  239    LDL Chol Calc (NIH)  Date Value Ref Range Status  10/11/2021 27 0 - 99 mg/dL Final   HDL  Date Value Ref Range Status  10/11/2021 53 >39 mg/dL Final   Triglycerides  Date Value Ref Range Status  10/11/2021 84 0 - 149 mg/dL Final   Triglycerides Piccolo,Waived  Date Value Ref Range Status  01/25/2018 150 (H) <150 mg/dL Final    Comment:                            Normal                   <150                         Borderline High     150 - 199                         High                200 - 499                         Very High                >499          Passed - Patient is not pregnant      Passed - Valid encounter within last 12 months    Recent Outpatient Visits          2 months ago Uncontrolled type 2 diabetes mellitus with hyperglycemia, without long-term current use of insulin ([place])   [place], [place] T, NP   3 months ago Hypertension associated with diabetes ([place])   [place], Jolene T, NP   4 months ago Hypertension associated with diabetes ([place])   [place] [name], Jolene T, NP   5 months ago Persistent atrial fibrillation ([place])   [place], Jolene T, NP   5 months ago Uncontrolled type 2 diabetes mellitus with hyperglycemia, without long-term current use of insulin ([place])    [place], [name], NP      Future Appointments            In 2 weeks [name], [name], NP [facility]

## 2022-04-01 NOTE — Telephone Encounter (Signed)
Requested Prescriptions  Pending Prescriptions Disp Refills  . atorvastatin (LIPITOR) 40 MG tablet 90 tablet 0    Sig: Take 1 tablet (40 mg total) by mouth at bedtime.     Cardiovascular:  Antilipid - Statins Failed - 03/31/2022 12:29 PM      Failed - Lipid Panel in normal range within the last 12 months    Cholesterol, Total  Date Value Ref Range Status  10/11/2021 97 (L) 100 - 199 mg/dL Final   Cholesterol Piccolo, Waived  Date Value Ref Range Status  01/25/2018 111 <200 mg/dL Final    Comment:                            Desirable                <200                         Borderline High      200- 239                         High                     >239    LDL Chol Calc (NIH)  Date Value Ref Range Status  10/11/2021 27 0 - 99 mg/dL Final   HDL  Date Value Ref Range Status  10/11/2021 53 >39 mg/dL Final   Triglycerides  Date Value Ref Range Status  10/11/2021 84 0 - 149 mg/dL Final   Triglycerides Piccolo,Waived  Date Value Ref Range Status  01/25/2018 150 (H) <150 mg/dL Final    Comment:                            Normal                   <150                         Borderline High     150 - 199                         High                200 - 499                         Very High                >499          Passed - Patient is not pregnant      Passed - Valid encounter within last 12 months    Recent Outpatient Visits          2 months ago Uncontrolled type 2 diabetes mellitus with hyperglycemia, without long-term current use of insulin ([place])   [place], [place] T, NP   3 months ago Hypertension associated with diabetes ([place])   [place], Jolene T, NP   4 months ago Hypertension associated with diabetes ([place])   [place], Jolene T, NP   5 months ago Persistent atrial fibrillation ([place])   [place], [place] T, NP   5 months ago Uncontrolled type 2 diabetes  mellitus with hyperglycemia, without long-term current use of insulin ([place])   [place], Leslie T, NP      Future Appointments            In 2 weeks [name], [name], NP [facility]           . telmisartan (MICARDIS) 40 MG tablet 90 tablet 4    Sig: Take 1 tablet (40 mg total) by mouth daily.     Cardiovascular:  Angiotensin Receptor Blockers Failed - 03/31/2022 12:29 PM      Failed - Last BP in normal range    BP Readings from Last 1 Encounters:  03/25/22 (!) 162/73         Passed - Cr in normal range and within 180 days    Creatinine  Date Value Ref Range Status  07/29/2011 0.99 0.60 - 1.30 mg/dL Final   Creatinine, Ser  Date Value Ref Range Status  02/12/2022 1.16 0.76 - 1.27 mg/dL Final         Passed - K in normal range and within 180 days    Potassium  Date Value Ref Range Status  02/12/2022 4.6 3.5 - 5.2 mmol/L Final  07/29/2011 3.8 3.5 - 5.1 mmol/L Final         Passed - Patient is not pregnant      Passed - Valid encounter within last 6 months    Recent Outpatient Visits          2 months ago Uncontrolled type 2 diabetes mellitus with hyperglycemia, without long-term current use of insulin ([place])   [place], [place] T, NP   3 months ago Hypertension associated with diabetes ([place])   [place], Jolene T, NP   4 months ago Hypertension associated with diabetes ([place])   [place] [name], Jolene T, NP   5 months ago Persistent atrial fibrillation ([place])   [place], Jolene T, NP   5 months ago Uncontrolled type 2 diabetes mellitus with hyperglycemia, without long-term current use of insulin ([place])   [place], [name], NP      Future Appointments            In 2 weeks [name], [name], NP [facility]

## 2022-04-01 NOTE — Telephone Encounter (Signed)
Requested Prescriptions  Pending Prescriptions Disp Refills  . telmisartan (MICARDIS) 80 MG tablet [Pharmacy Med Name: Telmisartan 80 MG Oral Tablet] 30 tablet 0    Sig: Take 1/2 (one-half) tablet by mouth once daily     Cardiovascular:  Angiotensin Receptor Blockers Failed - 03/31/2022 10:33 AM      Failed - Last BP in normal range    BP Readings from Last 1 Encounters:  03/25/22 (!) 162/73         Passed - Cr in normal range and within 180 days    Creatinine  Date Value Ref Range Status  07/29/2011 0.99 0.60 - 1.30 mg/dL Final   Creatinine, Ser  Date Value Ref Range Status  02/12/2022 1.16 0.76 - 1.27 mg/dL Final         Passed - K in normal range and within 180 days    Potassium  Date Value Ref Range Status  02/12/2022 4.6 3.5 - 5.2 mmol/L Final  07/29/2011 3.8 3.5 - 5.1 mmol/L Final         Passed - Patient is not pregnant      Passed - Valid encounter within last 6 months    Recent Outpatient Visits          2 months ago Uncontrolled type 2 diabetes mellitus with hyperglycemia, without long-term current use of insulin ([place])   [place], [place] T, NP   3 months ago Hypertension associated with diabetes ([place])   [place], Jolene T, NP   4 months ago Hypertension associated with diabetes ([place])   [place] [name], Jolene T, NP   5 months ago Persistent atrial fibrillation ([place])   [place], Jolene T, NP   5 months ago Uncontrolled type 2 diabetes mellitus with hyperglycemia, without long-term current use of insulin ([place])   [place], [name], NP      Future Appointments            In 2 weeks [name], [name], NP [facility]

## 2022-04-07 ENCOUNTER — Ambulatory Visit: Payer: PPO

## 2022-04-08 ENCOUNTER — Telehealth: Payer: Self-pay

## 2022-04-08 DIAGNOSIS — G4733 Obstructive sleep apnea (adult) (pediatric): Secondary | ICD-10-CM | POA: Diagnosis not present

## 2022-04-08 NOTE — Progress Notes (Signed)
Chronic Care Management Pharmacy Assistant   Name: Andrew Cobb  MRN: [id number] DOB: 1951/05/23   Reason for Encounter: Disease State   Conditions to be addressed/monitored: HTN   Recent office visits:  None since last coordination call  Recent consult visits:  03/25/22 Andrew Rider, NP ([facility]) Orders: CPAP; Medication changes: none  03/17/22 Andrew Cobb D, NP-Cardiology (F/u Afib) Orders: Ambulatory referral to Cardiac Electrophysiology; Medication changes: none  03/13/22 [name], Andrew Demark, MD-Ophthalmology ( Macular Degeneration) Orders: intravitreal injection, OCT Retina-OU- Both Eyes; Medications changes: none  Hospital visits:  Medication Reconciliation was completed by comparing discharge summary, patient's EMR and Pharmacy list, and upon discussion with patient.  Admitted to the hospital on 03/06/22 due to Afib. Discharge date was 03/06/22. Discharged from [facility].    Medications that remain the same after Hospital Discharge:??  -All other medications will remain the same.    Medications: Outpatient Encounter Medications as of 04/08/2022  Medication Sig   acyclovir (ZOVIRAX) 400 MG tablet Take 1 tablet (400 mg total) by mouth 2 (two) times daily.   albuterol (VENTOLIN HFA) 108 (90 Base) MCG/ACT inhaler Inhale 2 puffs into the lungs every 6 (six) hours as needed for wheezing or shortness of breath.   amitriptyline (ELAVIL) 25 MG tablet Take 1 tablet (25 mg total) by mouth at bedtime.   apixaban (ELIQUIS) 5 MG TABS tablet Take 1 tablet (5 mg total) by mouth 2 (two) times daily.   atorvastatin (LIPITOR) 40 MG tablet Take 1 tablet (40 mg total) by mouth at bedtime.   cetirizine (ZYRTEC) 10 MG tablet Take 10 mg by mouth daily.   Cholecalciferol (VITAMIN D) 2000 units CAPS Take 2,000 Units by mouth daily.   Cyanocobalamin (B-12) 5000 MCG CAPS Take 5,000 mcg by mouth every other day.   empagliflozin (JARDIANCE) 25 MG TABS tablet Take 25 mg by mouth  daily.   fluticasone (FLONASE) 50 MCG/ACT nasal spray Place 2 sprays into both nostrils daily. (Patient taking differently: Place 2 sprays into both nostrils daily as needed for allergies.)   glucose blood (ONETOUCH ULTRA) test strip 1 each by Other route 2 (two) times daily. Use as instructed   Lancets (ONETOUCH ULTRASOFT) lancets USE TO CHECK BLOOD SUAGR TWICE DAILY   levETIRAcetam (KEPPRA) 500 MG tablet Take 1 tablet (500 mg total) by mouth 2 (two) times daily.   metFORMIN (GLUCOPHAGE) 1000 MG tablet Take 1 tablet (1,000 mg total) by mouth 2 (two) times daily with a meal.   Multiple Vitamins-Minerals (MULTIVITAMIN PO) Take 1 tablet by mouth daily.    tamsulosin (FLOMAX) 0.4 MG CAPS capsule Take 1 capsule (0.4 mg total) by mouth at bedtime.   telmisartan (MICARDIS) 40 MG tablet Take 1 tablet (40 mg total) by mouth daily.   Facility-Administered Encounter Medications as of 04/08/2022  Medication   0.9 %  sodium chloride infusion   0.9 %  sodium chloride infusion   Reviewed chart prior to disease state call. Spoke with patient regarding BP  Recent Office Vitals: BP Readings from Last 3 Encounters:  03/25/22 (!) 162/73  03/17/22 124/60  03/06/22 129/74   Pulse Readings from Last 3 Encounters:  03/25/22 (!) 56  03/17/22 73  03/06/22 79    Wt Readings from Last 3 Encounters:  03/25/22 206 lb 4 oz (93.6 kg)  03/17/22 202 lb 9.6 oz (91.9 kg)  03/06/22 200 lb (90.7 kg)     Kidney Function Lab Results  Component Value Date/Time   CREATININE  1.16 02/12/2022 09:49 AM   CREATININE 1.24 01/13/2022 09:00 AM   CREATININE 0.99 07/29/2011 11:02 AM   GFR 49.88 (L) 10/14/2019 10:25 AM   GFRNONAA >60 12/11/2021 09:37 AM   GFRNONAA >60 07/29/2011 11:02 AM   GFRAA 67 08/27/2020 10:54 AM   GFRAA >60 07/29/2011 11:02 AM       Latest Ref Rng & Units 02/12/2022    9:49 AM 01/13/2022    9:00 AM 12/11/2021    9:37 AM  BMP  Glucose 70 - 99 mg/dL 142  211  150   BUN 8 - 27 mg/dL '28  26  21    '$ Creatinine 0.76 - 1.27 mg/dL 1.16  1.24  0.96   BUN/Creat Ratio 10 - '24 24  21    '$ Sodium 134 - 144 mmol/L 140  139  138   Potassium 3.5 - 5.2 mmol/L 4.6  4.4  4.5   Chloride 96 - 106 mmol/L 105  101  105   CO2 20 - 29 mmol/L '26  21  27   '$ Calcium 8.6 - 10.2 mg/dL 9.7  9.0  9.4     Unsuccessful outbound call made today to assist with:  Diabetic Assessment Call.  Outreach Attempt:  3rd Attempt, to reach patient.  A HIPAA compliant voice message was left requesting a return call.  Instructed patient to call back at  earliest convenience.  Current antihypertensive regimen:  Metformin 1000 mg take 1 tab twice daily  Adherence Review: Is the patient currently on ACE/ARB medication? Yes Does the patient have >5 day gap between last estimated fill dates? No  Care Gaps: Colonoscopy-03/26/21 Diabetic Foot Exam-10/18/21 Ophthalmology-03/13/22 Dexa Scan - NA Annual Well Visit - 07/24/21 (Medicare)  Micro albumin-NA Hemoglobin A1c- 01/13/22 (7.0)  Star Rating Drugs: Atorvastatin 40 mg-last fill 01/17/22 90 ds, 10/19/21 90 ds Metformin 1000 mg-last fill 02/06/22 90 ds, 11/20/21 90 ds Telmisartan 40 mg-last fill 11/13/21 90 ds, 10/10/21 60 ds  [place] [phone]

## 2022-04-13 NOTE — Patient Instructions (Signed)
Atrial Fibrillation  Atrial fibrillation is a type of heartbeat that is irregular or fast. If you have this condition, your heart beats without any order. This makes it hard for your heart to pump blood in a normal way. Atrial fibrillation may come and go, or it may become a long-lasting problem. If this condition is not treated, it can put you at higher risk for stroke, heart failure, and other heart problems. What are the causes? This condition may be caused by diseases that damage the heart. They include: High blood pressure. Heart failure. Heart valve disease. Heart surgery. Other causes include: Diabetes. Thyroid disease. Being overweight. Kidney disease. Sometimes the cause is not known. What increases the risk? You are more likely to develop this condition if: You are older. You smoke. You exercise often and very hard. You have a family history of this condition. You are a man. You use drugs. You drink a lot of alcohol. You have lung conditions, such as emphysema, pneumonia, or COPD. You have sleep apnea. What are the signs or symptoms? Common symptoms of this condition include: A feeling that your heart is beating very fast. Chest pain or discomfort. Feeling short of breath. Suddenly feeling light-headed or weak. Getting tired easily during activity. Fainting. Sweating. In some cases, there are no symptoms. How is this treated? Treatment for this condition depends on underlying conditions and how you feel when you have atrial fibrillation. They include: Medicines to: Prevent blood clots. Treat heart rate or heart rhythm problems. Using devices, such as a pacemaker, to correct heart rhythm problems. Doing surgery to remove the part of the heart that sends bad signals. Closing an area where clots can form in the heart (left atrial appendage). In some cases, your doctor will treat other underlying conditions. Follow these instructions at home: Medicines Take  over-the-counter and prescription medicines only as told by your doctor. Do not take any new medicines without first talking to your doctor. If you are taking blood thinners: Talk with your doctor before you take any medicines that have aspirin or NSAIDs, such as ibuprofen, in them. Take your medicine exactly as told by your doctor. Take it at the same time each day. Avoid activities that could hurt or bruise you. Follow instructions about how to prevent falls. Wear a bracelet that says you are taking blood thinners. Or, carry a card that lists what medicines you take. Lifestyle     Do not use any products that have nicotine or tobacco in them. These include cigarettes, e-cigarettes, and chewing tobacco. If you need help quitting, ask your doctor. Eat heart-healthy foods. Talk with your doctor about the right eating plan for you. Exercise regularly as told by your doctor. Do not drink alcohol. Lose weight if you are overweight. Do not use drugs, including cannabis. General instructions If you have a condition that causes breathing to stop for a short period of time (apnea), treat it as told by your doctor. Keep a healthy weight. Do not use diet pills unless your doctor says they are safe for you. Diet pills may make heart problems worse. Keep all follow-up visits as told by your doctor. This is important. Contact a doctor if: You notice a change in the speed, rhythm, or strength of your heartbeat. You are taking a blood-thinning medicine and you get more bruising. You get tired more easily when you move or exercise. You have a sudden change in weight. Get help right away if:  You have pain in   your chest or your belly (abdomen). You have trouble breathing. You have side effects of blood thinners, such as blood in your vomit, poop (stool), or pee (urine), or bleeding that cannot stop. You have any signs of a stroke. "BE FAST" is an easy way to remember the main warning signs: B -  Balance. Signs are dizziness, sudden trouble walking, or loss of balance. E - Eyes. Signs are trouble seeing or a change in how you see. F - Face. Signs are sudden weakness or loss of feeling in the face, or the face or eyelid drooping on one side. A - Arms. Signs are weakness or loss of feeling in an arm. This happens suddenly and usually on one side of the body. S - Speech. Signs are sudden trouble speaking, slurred speech, or trouble understanding what people say. T - Time. Time to call emergency services. Write down what time symptoms started. You have other signs of a stroke, such as: A sudden, very bad headache with no known cause. Feeling like you may vomit (nausea). Vomiting. A seizure. These symptoms may be an emergency. Do not wait to see if the symptoms will go away. Get medical help right away. Call your local emergency services (911 in the [place] Do not drive yourself to the hospital. Summary Atrial fibrillation is a type of heartbeat that is irregular or fast. You are at higher risk of this condition if you smoke, are older, have diabetes, or are overweight. Follow your doctor's instructions about medicines, diet, exercise, and follow-up visits. Get help right away if you have signs or symptoms of a stroke. Get help right away if you cannot catch your breath, or you have chest pain or discomfort. This information is not intended to replace advice given to you by your health care provider. Make sure you discuss any questions you have with your health care provider. Document Revised: 12/15/2018 Document Reviewed: 12/15/2018 Elsevier Patient Education  2023 [employer] Inc.  

## 2022-04-15 ENCOUNTER — Encounter: Payer: Self-pay | Admitting: Nurse Practitioner

## 2022-04-15 ENCOUNTER — Ambulatory Visit (INDEPENDENT_AMBULATORY_CARE_PROVIDER_SITE_OTHER): Payer: PPO | Admitting: Nurse Practitioner

## 2022-04-15 VITALS — BP 150/64 | HR 54 | Temp 97.1°F | Ht 70.0 in | Wt 203.3 lb

## 2022-04-15 DIAGNOSIS — E1169 Type 2 diabetes mellitus with other specified complication: Secondary | ICD-10-CM

## 2022-04-15 DIAGNOSIS — I4819 Other persistent atrial fibrillation: Secondary | ICD-10-CM

## 2022-04-15 DIAGNOSIS — E785 Hyperlipidemia, unspecified: Secondary | ICD-10-CM

## 2022-04-15 DIAGNOSIS — Z23 Encounter for immunization: Secondary | ICD-10-CM

## 2022-04-15 DIAGNOSIS — E1159 Type 2 diabetes mellitus with other circulatory complications: Secondary | ICD-10-CM | POA: Diagnosis not present

## 2022-04-15 DIAGNOSIS — H353211 Exudative age-related macular degeneration, right eye, with active choroidal neovascularization: Secondary | ICD-10-CM | POA: Diagnosis not present

## 2022-04-15 DIAGNOSIS — E1165 Type 2 diabetes mellitus with hyperglycemia: Secondary | ICD-10-CM | POA: Diagnosis not present

## 2022-04-15 DIAGNOSIS — G4733 Obstructive sleep apnea (adult) (pediatric): Secondary | ICD-10-CM

## 2022-04-15 DIAGNOSIS — J4520 Mild intermittent asthma, uncomplicated: Secondary | ICD-10-CM

## 2022-04-15 DIAGNOSIS — D6869 Other thrombophilia: Secondary | ICD-10-CM | POA: Diagnosis not present

## 2022-04-15 DIAGNOSIS — I700 Atherosclerosis of aorta: Secondary | ICD-10-CM

## 2022-04-15 DIAGNOSIS — I152 Hypertension secondary to endocrine disorders: Secondary | ICD-10-CM

## 2022-04-15 DIAGNOSIS — D692 Other nonthrombocytopenic purpura: Secondary | ICD-10-CM | POA: Diagnosis not present

## 2022-04-15 DIAGNOSIS — R569 Unspecified convulsions: Secondary | ICD-10-CM | POA: Diagnosis not present

## 2022-04-15 DIAGNOSIS — Z8673 Personal history of transient ischemic attack (TIA), and cerebral infarction without residual deficits: Secondary | ICD-10-CM

## 2022-04-15 DIAGNOSIS — Z8546 Personal history of malignant neoplasm of prostate: Secondary | ICD-10-CM

## 2022-04-15 LAB — MICROALBUMIN, URINE WAIVED
Creatinine, Urine Waived: 100 mg/dL (ref 10–300)
Microalb, Ur Waived: 30 mg/L — ABNORMAL HIGH (ref 0–19)

## 2022-04-15 LAB — BAYER DCA HB A1C WAIVED: HB A1C (BAYER DCA - WAIVED): 6.7 % — ABNORMAL HIGH (ref 4.8–5.6)

## 2022-04-15 MED ORDER — TAMSULOSIN HCL 0.4 MG PO CAPS: 0.4000 mg | ORAL_CAPSULE | Freq: Every day | ORAL | 4 refills | Status: DC

## 2022-04-15 MED ORDER — EMPAGLIFLOZIN 25 MG PO TABS
25.0000 mg | ORAL_TABLET | Freq: Every day | ORAL | 12 refills | Status: DC
Start: 2022-04-15 — End: 2024-01-28

## 2022-04-15 MED ORDER — EMPAGLIFLOZIN 25 MG PO TABS: 25.0000 mg | ORAL_TABLET | Freq: Every day | ORAL | 12 refills | Status: DC

## 2022-04-15 MED ORDER — ALBUTEROL SULFATE HFA 108 (90 BASE) MCG/ACT IN AERS: 2.0000 | INHALATION_SPRAY | Freq: Four times a day (QID) | RESPIRATORY_TRACT | 4 refills | Status: DC | PRN

## 2022-04-15 MED ORDER — TELMISARTAN 40 MG PO TABS
40.0000 mg | ORAL_TABLET | Freq: Every day | ORAL | 4 refills | Status: DC
Start: 2022-04-15 — End: 2022-04-15

## 2022-04-15 MED ORDER — TELMISARTAN 80 MG PO TABS: 80.0000 mg | ORAL_TABLET | Freq: Every day | ORAL | 4 refills | Status: DC

## 2022-04-15 MED ORDER — LEVETIRACETAM 500 MG PO TABS: 500.0000 mg | ORAL_TABLET | Freq: Two times a day (BID) | ORAL | 4 refills | Status: DC

## 2022-04-15 MED ORDER — METFORMIN HCL 1000 MG PO TABS: 1000.0000 mg | ORAL_TABLET | Freq: Two times a day (BID) | ORAL | 4 refills | Status: DC

## 2022-04-15 MED ORDER — ATORVASTATIN CALCIUM 40 MG PO TABS
40.0000 mg | ORAL_TABLET | Freq: Every day | ORAL | 4 refills | Status: DC
Start: 2022-04-15 — End: 2023-03-16

## 2022-04-15 MED ORDER — APIXABAN 5 MG PO TABS: 5.0000 mg | ORAL_TABLET | Freq: Two times a day (BID) | ORAL | 4 refills | Status: DC

## 2022-04-15 MED ORDER — AMITRIPTYLINE HCL 25 MG PO TABS: 25.0000 mg | ORAL_TABLET | Freq: Every day | ORAL | 4 refills | Status: DC

## 2022-04-15 NOTE — Assessment & Plan Note (Addendum)
Chronic, ongoing with BP above neuro goal <130/80 in office and at home.  Recommend he monitor BP at home daily and document.  Will increase Telmisartan back to 80 MG daily only.  Continue focus on DASH diet and regular exercise at home.  LABS: BMP.  Continue collaboration with cardiology.  Recommend he wear compression hose at home to avoid syncopal episodes and orthostatic BP.

## 2022-04-15 NOTE — Assessment & Plan Note (Addendum)
Chronic.  Noted on exam, continue to monitor and recommend gentle skin care at home + notify provider if skin breakdown presents. 

## 2022-04-15 NOTE — Assessment & Plan Note (Addendum)
Chronic.  Noted on imaging 07/11/20 and previous imaging.  Recommend continue daily statin therapy and ASA for prevention. 

## 2022-04-15 NOTE — Assessment & Plan Note (Signed)
Continue collaboration with neurology due to significant stroke history.  Continue current medication regimen. 

## 2022-04-15 NOTE — Assessment & Plan Note (Signed)
Chronic, ongoing due to Eliquis use with A-fib.  Monitor skin for bruising and increased bleeding. 

## 2022-04-15 NOTE — Progress Notes (Signed)
BP (!) 150/64   Pulse (!) 54   Temp (!) 97.1 F (36.2 C) (Oral)   Ht '5\' 10"'$  (1.778 m)   Wt 203 lb 4.8 oz (92.2 kg)   SpO2 98%   BMI 29.17 kg/m    Subjective:    Patient ID: Andrew Cobb, male    DOB: 1951-01-04, 71 y.o.   MRN: [id number]  HPI: Andrew Cobb is a 71 y.o. male  Chief Complaint  Patient presents with   Hypertension   Hyperlipidemia   Diabetes    Patient most recent Diabetic Eye Exam was requested at today's visit.    Vitamin D        Seizures   Atrial Fibrillation   Procedure    Patient says he has an upcoming Watchman Procedure on the 3rd of November. Patient says he had an procedure at [facility] and they were not successful, so he will have another visit, but with [facility].    DIABETES Last A1c 7% in July.  He continues on Metformin 1000 MG BID and Jardiance 10 MG -- gets assistance with Jardiance. 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 Continues to be very active at baseline.   Followed by ophthalmology for macular degeneration and gets injections -- saw last 03/13/22. Hypoglycemic episodes:no Polydipsia/polyuria: no Visual disturbance: no Chest pain: no Paresthesias: no Glucose Monitoring: yes  Accucheck frequency: Daily  Fasting glucose: 115 to 120  Post prandial:  Evening:  Before meals: Taking Insulin?: no  Long acting insulin:  Short acting insulin: Blood Pressure Monitoring: daily Retinal Examination: Up to Date continues to go for injections Foot Exam: Up to Date Pneumovax: Up to Date Influenza: Up to Date Aspirin: no   HYPERTENSION / HYPERLIPIDEMIA Continues Telmisartan 40 MG (was previously on 80 MG) and Atorvastatin 40 MG daily. Noted to have aortic atherosclerosis on imaging 07/11/20.  Cardioversion on 07/04/21. Watchmen attempt on 03/06/22, not successful, awaiting to see [facility] on 05/09/22 for further assessment for placement. Last saw cardiology 03/17/22.    Followed by neurology due to history of stroke events x 3, last saw  neurology 03/25/22. Has loop recorder in place.  CVA caused some mild expressive aphasia.  Takes Keppra for history of seizure like activity post CVAs + Amitriptyline for headaches post CVA.    Continues to use CPAP nightly, 100% of the time -- has had for > 23 years. Satisfied with current treatment? yes Duration of hypertension: chronic BP monitoring frequency: daily BP range: 150/70-80 at home BP medication side effects: no Duration of hyperlipidemia: chronic Cholesterol medication side effects: no Cholesterol supplements: none Medication compliance: good compliance Aspirin: no Recent stressors: no Recurrent headaches: no Visual changes: no Palpitations: no Dyspnea: no Chest pain: no Lower extremity edema: no Dizzy/lightheaded: no   ATRIAL FIBRILLATION Followed by cardiology. Atrial fibrillation status: stable Satisfied with current treatment: yes  Medication side effects:  no Medication compliance: good compliance Etiology of atrial fibrillation:  Palpitations:  no Chest pain:  no Dyspnea on exertion:  no Orthopnea:  no Syncope:  no Edema:  no Ventricular rate control: Not indicated Anti-coagulation: not indicated   ANEMIA Continues on B12 and multivitamin at home. Improved on recent labs. Anemia status: stable Etiology of anemia: unknown Duration of anemia treatment: months Compliance with treatment: good compliance Iron supplementation side effects: no Severity of anemia: mild Fatigue: no Decreased exercise tolerance: no  Dyspnea on exertion: no Palpitations: no Bleeding: no Pica: no   ASTHMA Asthma status: stable Satisfied  with current treatment?: yes Albuterol/rescue inhaler frequency: every other day, often at night Dyspnea frequency: none Wheezing frequency: none Cough frequency: none Nocturnal symptom frequency: none Limitation of activity: no Current upper respiratory symptoms: no Failed/intolerant to following asthma meds:  Asthma meds in  past: as above Aerochamber/spacer use: no Visits to ER or Urgent Care in past year: no Pneumovax: Up to Date Influenza: Up to Date   Relevant past medical, surgical, family and social history reviewed and updated as indicated. Interim medical history since our last visit reviewed. Allergies and medications reviewed and updated.  Review of Systems  Constitutional:  Negative for activity change, diaphoresis, fatigue and fever.  Respiratory:  Negative for cough, chest tightness, shortness of breath and wheezing.   Cardiovascular:  Negative for chest pain, palpitations and leg swelling.  Gastrointestinal: Negative.   Neurological: Negative.   Psychiatric/Behavioral: Negative.      Per HPI unless specifically indicated above     Objective:    BP (!) 150/64   Pulse (!) 54   Temp (!) 97.1 F (36.2 C) (Oral)   Ht '5\' 10"'$  (1.778 m)   Wt 203 lb 4.8 oz (92.2 kg)   SpO2 98%   BMI 29.17 kg/m   Wt Readings from Last 3 Encounters:  04/15/22 203 lb 4.8 oz (92.2 kg)  03/25/22 206 lb 4 oz (93.6 kg)  03/17/22 202 lb 9.6 oz (91.9 kg)    Physical Exam Vitals and nursing note reviewed.  Constitutional:      General: He is awake. He is not in acute distress.    Appearance: He is well-developed and well-groomed. He is not ill-appearing.  HENT:     Head: Normocephalic and atraumatic.     Right Ear: Hearing normal. No drainage.     Left Ear: Hearing normal. No drainage.  Eyes:     General: Lids are normal.        Right eye: No discharge.        Left eye: No discharge.     Conjunctiva/sclera: Conjunctivae normal.     Pupils: Pupils are equal, round, and reactive to light.  Neck:     Thyroid: No thyromegaly.     Vascular: No carotid bruit or JVD.     Trachea: Trachea normal.  Cardiovascular:     Rate and Rhythm: Normal rate and regular rhythm.     Heart sounds: Normal heart sounds, S1 normal and S2 normal. No murmur heard.    No gallop.  Pulmonary:     Effort: Pulmonary effort is  normal. No accessory muscle usage or respiratory distress.     Breath sounds: Normal breath sounds.  Abdominal:     General: Bowel sounds are normal.     Palpations: Abdomen is soft. There is no hepatomegaly or splenomegaly.  Musculoskeletal:        General: Normal range of motion.     Cervical back: Normal range of motion and neck supple.     Right lower leg: No edema.     Left lower leg: No edema.  Skin:    General: Skin is warm and dry.     Capillary Refill: Capillary refill takes less than 2 seconds.     Findings: Bruising present.     Comments: Bilateral bruises scattered to upper extremities.  Neurological:     Mental Status: He is alert and oriented to person, place, and time.     Deep Tendon Reflexes: Reflexes are normal and symmetric.  Psychiatric:  Attention and Perception: Attention normal.        Mood and Affect: Mood normal.        Speech: Speech normal.        Behavior: Behavior normal. Behavior is cooperative.        Thought Content: Thought content normal.    Results for orders placed or performed during the hospital encounter of 03/06/22  POCT Activated clotting time  Result Value Ref Range   Activated Clotting Time 317 seconds      Assessment & Plan:   Problem List Items Addressed This Visit       Cardiovascular and Mediastinum   Aortic atherosclerosis ([place])    Chronic.  Noted on imaging 07/11/20 and previous imaging.  Recommend continue daily statin therapy and ASA for prevention.      Relevant Medications   atorvastatin (LIPITOR) 40 MG tablet   apixaban (ELIQUIS) 5 MG TABS tablet   telmisartan (MICARDIS) 80 MG tablet   Exudative age-related macular degeneration of right eye with active choroidal neovascularization (HCC)    Chronic, ongoing, followed by opthalmology.  Continue this collaboration.      Relevant Medications   atorvastatin (LIPITOR) 40 MG tablet   apixaban (ELIQUIS) 5 MG TABS tablet   telmisartan (MICARDIS) 80 MG tablet    Hypertension associated with diabetes ([place])    Chronic, ongoing with BP above neuro goal <130/80 in office and at home.  Recommend he monitor BP at home daily and document.  Will increase Telmisartan back to 80 MG daily only.  Continue focus on DASH diet and regular exercise at home.  LABS: BMP.  Continue collaboration with cardiology.  Recommend he wear compression hose at home to avoid syncopal episodes and orthostatic BP.        Relevant Medications   metFORMIN (GLUCOPHAGE) 1000 MG tablet   atorvastatin (LIPITOR) 40 MG tablet   apixaban (ELIQUIS) 5 MG TABS tablet   empagliflozin (JARDIANCE) 25 MG TABS tablet   telmisartan (MICARDIS) 80 MG tablet   Other Relevant Orders   Bayer DCA Hb A1c Waived   Basic metabolic panel   Microalbumin, Urine Waived   Persistent atrial fibrillation (HCC)    Chronic, ongoing with cardioversion on 07/04/21 + Watchmen attempt 03/06/22.  Continue collaboration with cardiology.  At this time continue Eliquis as ordered.      Relevant Medications   atorvastatin (LIPITOR) 40 MG tablet   apixaban (ELIQUIS) 5 MG TABS tablet   telmisartan (MICARDIS) 80 MG tablet   Other Relevant Orders   Basic metabolic panel   Senile purpura (HCC)    Chronic.  Noted on exam, continue to monitor and recommend gentle skin care at home + notify provider if skin breakdown presents.      Relevant Medications   atorvastatin (LIPITOR) 40 MG tablet   apixaban (ELIQUIS) 5 MG TABS tablet   telmisartan (MICARDIS) 80 MG tablet     Respiratory   Asthma    Chronic, ongoing.  Stable at this time.  Continue current medication regimen and collaboration with pulmonary as needed.      Relevant Medications   albuterol (VENTOLIN HFA) 108 (90 Base) MCG/ACT inhaler   OSA (obstructive sleep apnea)    Chronic, stable with 100% use of CPAP.  Praised for this.        Endocrine   Hyperlipidemia associated with type 2 diabetes mellitus (HCC)    Chronic, ongoing.  Continue tight lipid  control, may need to increase Atorvastatin to 80 MG if  ever elevation, recent LDL below goal for stroke prevention.  Continue current Atorvastatin dosing.  Lipid check today.      Relevant Medications   metFORMIN (GLUCOPHAGE) 1000 MG tablet   atorvastatin (LIPITOR) 40 MG tablet   apixaban (ELIQUIS) 5 MG TABS tablet   empagliflozin (JARDIANCE) 25 MG TABS tablet   telmisartan (MICARDIS) 80 MG tablet   Other Relevant Orders   Bayer DCA Hb A1c Waived   Lipid Panel w/o Chol/HDL Ratio   Uncontrolled type 2 diabetes mellitus with hyperglycemia, without long-term current use of insulin (HCC) - Primary    Chronic, ongoing with A1c 6.7%, trend down, plus urine ALB 05 May 2022.  Continue Telmisartan for kidney protection. Neuro goal <6.5%, recommend he take medication daily and focus heavily on diet -- as often at goal.  Continue  Metformin 1000 MG BID and Jardiance 25 MG - obtains via assistance (new papers obtained and will fax).  May need to consider GLP1 next visit if elevations.  Recommend he check BS twice daily + continue focus on regular exercise and diet.  Return in 3 months.      Relevant Medications   metFORMIN (GLUCOPHAGE) 1000 MG tablet   atorvastatin (LIPITOR) 40 MG tablet   empagliflozin (JARDIANCE) 25 MG TABS tablet   telmisartan (MICARDIS) 80 MG tablet   Other Relevant Orders   Bayer DCA Hb A1c Waived   Microalbumin, Urine Waived     Other   History of prostate cancer   Relevant Medications   tamsulosin (FLOMAX) 0.4 MG CAPS capsule   History of stroke    Continue collaboration with neurology due to significant stroke history.  Continue current medication regimen.      Secondary hypercoagulable state ([place])    Chronic, ongoing due to Eliquis use with A-fib.  Monitor skin for bruising and increased bleeding.      Seizures (HCC)    Chronic, stable.  Being followed by neurology.  Continue current Keppra dose as prescribed by them and collaboration with neurology, reviewed  notes.  Continue Amitriptyline for history of headaches post CVA.      Relevant Medications   levETIRAcetam (KEPPRA) 500 MG tablet   Other Relevant Orders   Levetiracetam level   Other Visit Diagnoses     Flu vaccine need       Flu vaccine in office today.   Relevant Orders   Flu Vaccine QUAD High Dose(Fluad) (Completed)        Follow up plan: Return in about 3 months (around 07/16/2022) for T2DM, HTN/HLD, ASTHMA, SEIZURE, ANEMIA, A-FIB.

## 2022-04-15 NOTE — Assessment & Plan Note (Signed)
Chronic, stable.  Being followed by neurology.  Continue current Keppra dose as prescribed by them and collaboration with neurology, reviewed notes.  Continue Amitriptyline for history of headaches post CVA. 

## 2022-04-15 NOTE — Assessment & Plan Note (Signed)
Chronic, ongoing.  Continue tight lipid control, may need to increase Atorvastatin to 80 MG if ever elevation, recent LDL below goal for stroke prevention.  Continue current Atorvastatin dosing.  Lipid check today. 

## 2022-04-15 NOTE — Assessment & Plan Note (Signed)
Chronic, ongoing with A1c 6.7%, trend down, plus urine ALB 05 May 2022.  Continue Telmisartan for kidney protection. Neuro goal <6.5%, recommend he take medication daily and focus heavily on diet -- as often at goal.  Continue  Metformin 1000 MG BID and Jardiance 25 MG - obtains via assistance (new papers obtained and will fax).  May need to consider GLP1 next visit if elevations.  Recommend he check BS twice daily + continue focus on regular exercise and diet.  Return in 3 months.

## 2022-04-15 NOTE — Assessment & Plan Note (Signed)
Chronic, stable with 100% use of CPAP.  Praised for this. 

## 2022-04-15 NOTE — Assessment & Plan Note (Signed)
Chronic, ongoing.  Stable at this time.  Continue current medication regimen and collaboration with pulmonary as needed. 

## 2022-04-15 NOTE — Assessment & Plan Note (Signed)
Chronic, ongoing, followed by opthalmology.  Continue this collaboration. 

## 2022-04-15 NOTE — Assessment & Plan Note (Signed)
Chronic, ongoing with cardioversion on 07/04/21 + Watchmen attempt 03/06/22.  Continue collaboration with cardiology.  At this time continue Eliquis as ordered.

## 2022-04-16 ENCOUNTER — Telehealth: Payer: Self-pay | Admitting: *Deleted

## 2022-04-16 ENCOUNTER — Ambulatory Visit: Payer: Self-pay | Admitting: *Deleted

## 2022-04-16 MED ORDER — ALBUTEROL SULFATE HFA 108 (90 BASE) MCG/ACT IN AERS: 2.0000 | INHALATION_SPRAY | Freq: Four times a day (QID) | RESPIRATORY_TRACT | 4 refills | Status: DC | PRN

## 2022-04-16 NOTE — Progress Notes (Signed)
Contacted via MyChart   Good evening Andrew Cobb, your labs have returned with exception of Lamictal which is still pending.  Kidney function, creatinine and eGFR, remains normal, as is liver function, AST and ALT.  Cholesterol levels at goal. Continue all medications.  Any questions? Keep being amazing!!  Thank you for allowing me to participate in your care.  I appreciate you. Kindest regards, Darvis Croft

## 2022-04-16 NOTE — Telephone Encounter (Signed)
Pharmacy called wanting to know if ca give pt 90 day supply of ventolin and also if they can substitute a generic if insurance will not cover name brand. Sent rx refill to cfp to answer.  Summary: pharmacy wanting clarification   albuterol (VENTOLIN HFA) 108 (90 Base) MCG/ACT inhaler Elixir  Mail Ordering calling wanting to know of the 3 inhalers which would pt ins cover? She then stated that the way the script was written they needed more clarification. Mail order pharmacy /pt not on the line cb at [phone] ref# [id number]

## 2022-04-16 NOTE — Telephone Encounter (Signed)
Pharmacy called to clarify if they can give 90 day supply at a time and if his insurance will cover a generic can they change to a generic? Please assess.        Requested Prescriptions  Pending Prescriptions Disp Refills   albuterol (VENTOLIN HFA) 108 (90 Base) MCG/ACT inhaler 18 g 4    Sig: Inhale 2 puffs into the lungs every 6 (six) hours as needed for wheezing or shortness of breath.     Pulmonology:  Beta Agonists 2 Failed - 04/16/2022 12:50 PM      Failed - Last BP in normal range    BP Readings from Last 1 Encounters:  04/15/22 (!) 150/64         Passed - Last Heart Rate in normal range    Pulse Readings from Last 1 Encounters:  04/15/22 (!) 54         Passed - Valid encounter within last 12 months    Recent Outpatient Visits           Yesterday Uncontrolled type 2 diabetes mellitus with hyperglycemia, without long-term current use of insulin ([place])   [place], [place] T, NP   3 months ago Uncontrolled type 2 diabetes mellitus with hyperglycemia, without long-term current use of insulin ([place])   [place], Fisher T, NP   4 months ago Hypertension associated with diabetes ([place])   [place], Jolene T, NP   5 months ago Hypertension associated with diabetes ([place])   [place], Jolene T, NP   5 months ago Persistent atrial fibrillation ([place])   [place], [name], NP       Future Appointments             In 3 months [name], [name], NP [facility]

## 2022-04-17 LAB — BASIC METABOLIC PANEL
BUN/Creatinine Ratio: 20 (ref 10–24)
BUN: 20 mg/dL (ref 8–27)
CO2: 25 mmol/L (ref 20–29)
Calcium: 8.9 mg/dL (ref 8.6–10.2)
Chloride: 102 mmol/L (ref 96–106)
Creatinine, Ser: 1.02 mg/dL (ref 0.76–1.27)
Glucose: 128 mg/dL — ABNORMAL HIGH (ref 70–99)
Potassium: 4.2 mmol/L (ref 3.5–5.2)
Sodium: 141 mmol/L (ref 134–144)
eGFR: 79 mL/min/{1.73_m2} (ref >59)

## 2022-04-17 LAB — LEVETIRACETAM LEVEL: Levetiracetam Lvl: 19.3 ug/mL (ref 10.0–40.0)

## 2022-04-17 LAB — LIPID PANEL W/O CHOL/HDL RATIO
Cholesterol, Total: 128 mg/dL (ref 100–199)
HDL: 55 mg/dL (ref >39)
LDL Chol Calc (NIH): 53 mg/dL (ref 0–99)
Triglycerides: 110 mg/dL (ref 0–149)
VLDL Cholesterol Cal: 20 mg/dL (ref 5–40)

## 2022-04-17 NOTE — Progress Notes (Signed)
Contacted via MyChart   Lamictal level is all nice and normal

## 2022-04-18 MED ORDER — ALBUTEROL SULFATE HFA 108 (90 BASE) MCG/ACT IN AERS: 2.0000 | INHALATION_SPRAY | Freq: Four times a day (QID) | RESPIRATORY_TRACT | 4 refills | Status: DC | PRN

## 2022-04-18 NOTE — Addendum Note (Signed)
Addended by: Marnee Guarneri T on: 04/18/2022 02:06 PM   Modules accepted: Orders

## 2022-04-18 NOTE — Telephone Encounter (Signed)
Pt's wife [name] called reporting that Arloa Koh is missing the dosage information for this medication, please advise

## 2022-04-18 NOTE — Telephone Encounter (Signed)
[facility] and provided them with the clarification they requested in regards to patient's Albuterol inhaler. Pharmacist verbalized understanding and has no further questions at this time.

## 2022-04-21 ENCOUNTER — Telehealth: Payer: Self-pay | Admitting: Nurse Practitioner

## 2022-04-21 ENCOUNTER — Other Ambulatory Visit: Payer: Self-pay | Admitting: Nurse Practitioner

## 2022-04-21 MED ORDER — TELMISARTAN 80 MG PO TABS: 80.0000 mg | ORAL_TABLET | Freq: Every day | ORAL | 0 refills | Status: DC

## 2022-04-21 NOTE — Telephone Encounter (Signed)
Requested Prescriptions  Pending Prescriptions Disp Refills  . telmisartan (MICARDIS) 80 MG tablet 30 tablet 0    Sig: Take 1 tablet (80 mg total) by mouth daily.     Cardiovascular:  Angiotensin Receptor Blockers Failed - 04/21/2022  4:24 PM      Failed - Last BP in normal range    BP Readings from Last 1 Encounters:  04/15/22 (!) 150/64         Passed - Cr in normal range and within 180 days    Creatinine  Date Value Ref Range Status  07/29/2011 0.99 0.60 - 1.30 mg/dL Final   Creatinine, Ser  Date Value Ref Range Status  04/15/2022 1.02 0.76 - 1.27 mg/dL Final         Passed - K in normal range and within 180 days    Potassium  Date Value Ref Range Status  04/15/2022 4.2 3.5 - 5.2 mmol/L Final  07/29/2011 3.8 3.5 - 5.1 mmol/L Final         Passed - Patient is not pregnant      Passed - Valid encounter within last 6 months    Recent Outpatient Visits          6 days ago Uncontrolled type 2 diabetes mellitus with hyperglycemia, without long-term current use of insulin ([place])   [place], Jolene T, NP   3 months ago Uncontrolled type 2 diabetes mellitus with hyperglycemia, without long-term current use of insulin ([place])   [place], [place] T, NP   4 months ago Hypertension associated with diabetes ([place])   [place], Jolene T, NP   5 months ago Hypertension associated with diabetes ([place])   [place], Jolene T, NP   5 months ago Persistent atrial fibrillation ([place])   [place], [name], NP      Future Appointments            In 3 days [name], [name], NP [facility], [place]   In 2 months [name], [name], NP [facility]

## 2022-04-21 NOTE — Telephone Encounter (Signed)
Caller would like a short supply of telmisartan (MICARDIS) 80 MG tablet sent to   [facility] [place] Phone:  [phone]  Fax:  [phone]      Until mail order is received. Caller would like this request expedited

## 2022-04-21 NOTE — Telephone Encounter (Signed)
Pt scheduled 10/19.

## 2022-04-21 NOTE — Telephone Encounter (Signed)
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  PT also stated to let provider know that his C-pap settings was set to .6.  Put in provider's folder.   [name] Kitchen

## 2022-04-24 ENCOUNTER — Encounter: Payer: Self-pay | Admitting: Nurse Practitioner

## 2022-04-24 ENCOUNTER — Ambulatory Visit (INDEPENDENT_AMBULATORY_CARE_PROVIDER_SITE_OTHER): Payer: PPO | Admitting: Nurse Practitioner

## 2022-04-24 ENCOUNTER — Telehealth: Payer: Self-pay | Admitting: Neurology

## 2022-04-24 VITALS — BP 113/67 | HR 68 | Temp 97.5°F | Ht 70.0 in | Wt 204.3 lb

## 2022-04-24 DIAGNOSIS — Z0289 Encounter for other administrative examinations: Secondary | ICD-10-CM

## 2022-04-24 DIAGNOSIS — E1165 Type 2 diabetes mellitus with hyperglycemia: Secondary | ICD-10-CM | POA: Diagnosis not present

## 2022-04-24 NOTE — Telephone Encounter (Signed)
Pt came into office states he needs a compliance report of Cpap for the last year. In order to be able to get his license back.

## 2022-04-24 NOTE — Progress Notes (Signed)
BP 113/67   Pulse 68   Temp (!) 97.5 F (36.4 C) (Oral)   Ht '5\' 10"'  (1.778 m)   Wt 204 lb 4.8 oz (92.7 kg)   SpO2 97%   BMI 29.31 kg/m    Subjective:    Patient ID: Andrew Cobb, male    DOB: Dec 17, 1950, 71 y.o.   MRN: [id number]  HPI: Andrew Cobb is a 71 y.o. male  Chief Complaint  Patient presents with   Form Completion   FORM COMPLETION: DMV forms for driving for [place], drives vehicles to various locations.  At this time all underlying chronic illnesses are stable.  Follows with all specialists and attends all visits.  Needs compliance report for CPAP and he will contact neurology on this.  No recent seizures, only had one episode in 2021, and no recent stroke.  Is scheduled for further assessment for [facility].  Is aware of reasons not to get in vehicle and drive.  Relevant past medical, surgical, family and social history reviewed and updated as indicated. Interim medical history since our last visit reviewed. Allergies and medications reviewed and updated.  Review of Systems  Constitutional:  Negative for activity change, diaphoresis, fatigue and fever.  Respiratory:  Negative for cough, chest tightness, shortness of breath and wheezing.   Cardiovascular:  Negative for chest pain, palpitations and leg swelling.  Gastrointestinal: Negative.   Neurological: Negative.   Psychiatric/Behavioral: Negative.      Per HPI unless specifically indicated above     Objective:    BP 113/67   Pulse 68   Temp (!) 97.5 F (36.4 C) (Oral)   Ht '5\' 10"'  (1.778 m)   Wt 204 lb 4.8 oz (92.7 kg)   SpO2 97%   BMI 29.31 kg/m   Wt Readings from Last 3 Encounters:  04/24/22 204 lb 4.8 oz (92.7 kg)  04/15/22 203 lb 4.8 oz (92.2 kg)  03/25/22 206 lb 4 oz (93.6 kg)    Physical Exam Vitals and nursing note reviewed.  Constitutional:      General: He is awake. He is not in acute distress.    Appearance: He is well-developed and well-groomed. He is  not ill-appearing.  HENT:     Head: Normocephalic and atraumatic.     Right Ear: Hearing normal. No drainage.     Left Ear: Hearing normal. No drainage.  Eyes:     General: Lids are normal.        Right eye: No discharge.        Left eye: No discharge.     Conjunctiva/sclera: Conjunctivae normal.     Pupils: Pupils are equal, round, and reactive to light.  Neck:     Thyroid: No thyromegaly.     Vascular: No carotid bruit or JVD.     Trachea: Trachea normal.  Cardiovascular:     Rate and Rhythm: Normal rate and regular rhythm.     Heart sounds: Normal heart sounds, S1 normal and S2 normal. No murmur heard.    No gallop.  Pulmonary:     Effort: Pulmonary effort is normal. No accessory muscle usage or respiratory distress.     Breath sounds: Normal breath sounds.  Abdominal:     General: Bowel sounds are normal.     Palpations: Abdomen is soft. There is no hepatomegaly or splenomegaly.  Musculoskeletal:        General: Normal range of motion.     Cervical back:  Normal range of motion and neck supple.     Right lower leg: No edema.     Left lower leg: No edema.  Skin:    General: Skin is warm and dry.     Capillary Refill: Capillary refill takes less than 2 seconds.     Findings: Bruising present.     Comments: Bilateral bruises scattered to upper extremities.  Neurological:     Mental Status: He is alert and oriented to person, place, and time.     Deep Tendon Reflexes: Reflexes are normal and symmetric.  Psychiatric:        Attention and Perception: Attention normal.        Mood and Affect: Mood normal.        Speech: Speech normal.        Behavior: Behavior normal. Behavior is cooperative.        Thought Content: Thought content normal.     Results for orders placed or performed in visit on 04/15/22  Bayer DCA Hb A1c Waived  Result Value Ref Range   HB A1C (BAYER DCA - WAIVED) 6.7 (H) 4.8 - 5.6 %  Basic metabolic panel  Result Value Ref Range   Glucose 128 (H) 70 -  99 mg/dL   BUN 20 8 - 27 mg/dL   Creatinine, Ser 1.02 0.76 - 1.27 mg/dL   eGFR 79 >59 mL/min/1.73   BUN/Creatinine Ratio 20 10 - 24   Sodium 141 134 - 144 mmol/L   Potassium 4.2 3.5 - 5.2 mmol/L   Chloride 102 96 - 106 mmol/L   CO2 25 20 - 29 mmol/L   Calcium 8.9 8.6 - 10.2 mg/dL  Lipid Panel w/o Chol/HDL Ratio  Result Value Ref Range   Cholesterol, Total 128 100 - 199 mg/dL   Triglycerides 110 0 - 149 mg/dL   HDL 55 >39 mg/dL   VLDL Cholesterol Cal 20 5 - 40 mg/dL   LDL Chol Calc (NIH) 53 0 - 99 mg/dL  Levetiracetam level  Result Value Ref Range   Levetiracetam Lvl 19.3 10.0 - 40.0 ug/mL  Microalbumin, Urine Waived  Result Value Ref Range   Microalb, Ur Waived 30 (H) 0 - 19 mg/L   Creatinine, Urine Waived 100 10 - 300 mg/dL   Microalb/Creat Ratio 30-300 (H) <30 mg/g      Assessment & Plan:   Problem List Items Addressed This Visit       Endocrine   Uncontrolled type 2 diabetes mellitus with hyperglycemia, without long-term current use of insulin (HCC) - Primary    Chronic, ongoing with A1c 6.7% recent visit, trend down, plus urine ALB 05 May 2022.  Continue Telmisartan for kidney protection. Neuro goal <6.5%, recommend he take medication daily and focus heavily on diet -- as often at goal.  Continue  Metformin 1000 MG BID and Jardiance 25 MG - obtains via assistance (new papers obtained and will fax).  May need to consider GLP1 next visit if elevations.  Recommend he check BS twice daily + continue focus on regular exercise and diet.        Other Visit Diagnoses     Encounter for completion of form with patient       Reviewed forms with patient at length and completed areas required.        Follow up plan: Return for as scheduled.

## 2022-04-24 NOTE — Telephone Encounter (Signed)
At front desk

## 2022-04-24 NOTE — Assessment & Plan Note (Signed)
Chronic, ongoing with A1c 6.7% recent visit, trend down, plus urine ALB 05 May 2022.  Continue Telmisartan for kidney protection. Neuro goal <6.5%, recommend he take medication daily and focus heavily on diet -- as often at goal.  Continue  Metformin 1000 MG BID and Jardiance 25 MG - obtains via assistance (new papers obtained and will fax).  May need to consider GLP1 next visit if elevations.  Recommend he check BS twice daily + continue focus on regular exercise and diet.

## 2022-04-24 NOTE — Patient Instructions (Signed)

## 2022-04-29 ENCOUNTER — Ambulatory Visit (INDEPENDENT_AMBULATORY_CARE_PROVIDER_SITE_OTHER): Payer: PPO | Admitting: Physician Assistant

## 2022-04-29 ENCOUNTER — Encounter: Payer: Self-pay | Admitting: Physician Assistant

## 2022-04-29 ENCOUNTER — Ambulatory Visit: Payer: Self-pay | Admitting: *Deleted

## 2022-04-29 VITALS — BP 139/75 | HR 60 | Temp 97.6°F | Ht 70.0 in | Wt 204.8 lb

## 2022-04-29 DIAGNOSIS — Z87891 Personal history of nicotine dependence: Secondary | ICD-10-CM | POA: Diagnosis not present

## 2022-04-29 DIAGNOSIS — J4521 Mild intermittent asthma with (acute) exacerbation: Secondary | ICD-10-CM

## 2022-04-29 DIAGNOSIS — J069 Acute upper respiratory infection, unspecified: Secondary | ICD-10-CM

## 2022-04-29 LAB — VERITOR FLU A/B WAIVED
Influenza A: NEGATIVE
Influenza B: NEGATIVE

## 2022-04-29 MED ORDER — PREDNISONE 20 MG PO TABS: 40.0000 mg | ORAL_TABLET | Freq: Every day | ORAL | 0 refills | Status: AC

## 2022-04-29 NOTE — Telephone Encounter (Signed)
Reason for Disposition  [1] MILD difficulty breathing (e.g., minimal/no SOB at rest, SOB with walking, pulse <100) AND [2] NEW-onset or WORSE than normal  Answer Assessment - Initial Assessment Questions 1. RESPIRATORY STATUS: "Describe your breathing?" (e.g., wheezing, shortness of breath, unable to speak, severe coughing)      Shortness of breath and coughing up green mucus.   Sore throat, runny nose.   No diarrhea  No body aches or chills 2. ONSET: "When did this breathing problem begin?"      Sunday 3. PATTERN "Does the difficult breathing come and go, or has it been constant since it started?"      Not asked 4. SEVERITY: "How bad is your breathing?" (e.g., mild, moderate, severe)    - MILD: No SOB at rest, mild SOB with walking, speaks normally in sentences, can lie down, no retractions, pulse < 100.    - MODERATE: SOB at rest, SOB with minimal exertion and prefers to sit, cannot lie down flat, speaks in phrases, mild retractions, audible wheezing, pulse 100-120.    - SEVERE: Very SOB at rest, speaks in single words, struggling to breathe, sitting hunched forward, retractions, pulse > 120      Moderate.    Using inhalers for wheezing.   Using albuterol inhalers 5. RECURRENT SYMPTOM: "Have you had difficulty breathing before?" If Yes, ask: "When was the last time?" and "What happened that time?"      Asthma     6. CARDIAC HISTORY: "Do you have any history of heart disease?" (e.g., heart attack, angina, bypass surgery, angioplasty)      Stents in his heart and A. Fib. 7. LUNG HISTORY: "Do you have any history of lung disease?"  (e.g., pulmonary embolus, asthma, emphysema)     Asthma 8. CAUSE: "What do you think is causing the breathing problem?"      No sick exposures 9. OTHER SYMPTOMS: "Do you have any other symptoms? (e.g., dizziness, runny nose, cough, chest pain, fever)     Runny nose, sore throat, hoarse voice, coughing green mucus 10. O2 SATURATION MONITOR:  "Do you use an oxygen  saturation monitor (pulse oximeter) at home?" If Yes, ask: "What is your reading (oxygen level) today?" "What is your usual oxygen saturation reading?" (e.g., 95%)       Not asked  11. PREGNANCY: "Is there any chance you are pregnant?" "When was your last menstrual period?"       N/A 12. TRAVEL: "Have you traveled out of the country in the last month?" (e.g., travel history, exposures)       N/A  Protocols used: Breathing Difficulty-A-AH

## 2022-04-29 NOTE — Telephone Encounter (Signed)
  Chief Complaint: Shortness of breath, sore throat, runny nose Symptoms: Shortness of breath since Sunday.   Has asthma Frequency: Since Sunday Pertinent Negatives: Patient denies fever Disposition: '[]'$ ED /'[]'$ Urgent Care (no appt availability in office) / '[x]'$ Appointment(In office/virtual)/ '[]'$  [place] [facility]/ '[]'$ Home Care/ '[]'$ Refused Recommended Disposition /'[]'$ [facility]/ '[]'$  Follow-up with PCP Additional Notes: Appt. Made for this morning with Erin Mecum, PA-C for 9:00.

## 2022-04-29 NOTE — Patient Instructions (Addendum)
Based on your described symptoms and the duration of symptoms it is likely that you have a viral upper respiratory infection (often called a "cold")  Symptoms can last for 3-10 days with lingering cough and intermittent symptoms lasting weeks after that.  The goal of treatment at this time is to reduce your symptoms and discomfort   I have sent in a Prednisone burst for you to take with food in the morning. This will help with your breathing. Please take in the morning and be mindful of your blood sugars as this can cause them to be a bit higher than normal.   You can use over the counter medications such as Dayquil/Nyquil, AlkaSeltzer formulations, etc to provide further relief of symptoms according to the manufacturer's instructions   If preferred you can use Coricidin to manage your symptoms rather than those medications mentioned above if you have high blood pressure.    If your symptoms do not improve or become worse in the next 5-7 days please make an apt at the office so we can see you  Go to the ER if you begin to have more serious symptoms such as shortness of breath, trouble breathing, loss of consciousness, swelling around the eyes, high fever, severe lasting headaches, vision changes or neck pain/stiffness.

## 2022-04-29 NOTE — Progress Notes (Signed)
Acute Office Visit   Patient: Andrew Cobb   DOB: 01/09/1951   71 y.o. Male  MRN: [id number] Visit Date: 04/29/2022  Today's healthcare provider: Dani Gobble Amandalynn Pitz, PA-C  Introduced myself to the patient as a Journalist, newspaper and provided education on APPs in clinical practice.    Chief Complaint  Patient presents with   Cough    Sore throat, runny nose, all started on Sunday   Subjective    Cough Associated symptoms include rhinorrhea, a sore throat and shortness of breath. Pertinent negatives include no chills, ear pain, fever, headaches, myalgias or wheezing.   HPI     Cough    Additional comments: Sore throat, runny nose, all started on Sunday      Last edited by Jerelene Redden, CMA on 04/29/2022  8:50 AM.       Buddy Duty on Sun [place]: sudden Reports productive cough and SOB He has not checked himself for COVID He denies recent sick contacts Interventions: Equate brand cough syrup for cold and fever, Tylenol  Denies loss of sense of taste or smell    Medications: Outpatient Medications Prior to Visit  Medication Sig   acyclovir (ZOVIRAX) 400 MG tablet Take 1 tablet (400 mg total) by mouth 2 (two) times daily.   albuterol (VENTOLIN HFA) 108 (90 Base) MCG/ACT inhaler Inhale 2 puffs into the lungs every 6 (six) hours as needed for wheezing or shortness of breath.   amitriptyline (ELAVIL) 25 MG tablet Take 1 tablet (25 mg total) by mouth at bedtime.   apixaban (ELIQUIS) 5 MG TABS tablet Take 1 tablet (5 mg total) by mouth 2 (two) times daily.   atorvastatin (LIPITOR) 40 MG tablet Take 1 tablet (40 mg total) by mouth at bedtime.   cetirizine (ZYRTEC) 10 MG tablet Take 10 mg by mouth daily.   Cholecalciferol (VITAMIN D) 2000 units CAPS Take 2,000 Units by mouth daily.   Cyanocobalamin (B-12) 5000 MCG CAPS Take 5,000 mcg by mouth every other day.   empagliflozin (JARDIANCE) 25 MG TABS tablet Take 1 tablet (25 mg total) by mouth daily.   fluticasone (FLONASE) 50 MCG/ACT  nasal spray Place 2 sprays into both nostrils daily. (Patient taking differently: Place 2 sprays into both nostrils daily as needed for allergies.)   glucose blood (ONETOUCH ULTRA) test strip 1 each by Other route 2 (two) times daily. Use as instructed   Lancets (ONETOUCH ULTRASOFT) lancets USE TO CHECK BLOOD SUAGR TWICE DAILY   levETIRAcetam (KEPPRA) 500 MG tablet Take 1 tablet (500 mg total) by mouth 2 (two) times daily.   metFORMIN (GLUCOPHAGE) 1000 MG tablet Take 1 tablet (1,000 mg total) by mouth 2 (two) times daily with a meal.   Multiple Vitamins-Minerals (MULTIVITAMIN PO) Take 1 tablet by mouth daily.    tamsulosin (FLOMAX) 0.4 MG CAPS capsule Take 1 capsule (0.4 mg total) by mouth at bedtime.   telmisartan (MICARDIS) 80 MG tablet Take 1 tablet (80 mg total) by mouth daily.   Facility-Administered Medications Prior to Visit  Medication Dose Route Frequency Provider   0.9 %  sodium chloride infusion   Intravenous Continuous Vickie Epley, MD   0.9 %  sodium chloride infusion   Intravenous Continuous Vickie Epley, MD    Review of Systems  Constitutional:  Negative for chills and fever.  HENT:  Positive for congestion, rhinorrhea, sore throat, trouble swallowing and voice change (Hoarseness). Negative for ear pain, sinus pressure and sinus  pain.   Respiratory:  Positive for cough and shortness of breath. Negative for chest tightness and wheezing.   Gastrointestinal:  Negative for diarrhea, nausea and vomiting.  Musculoskeletal:  Negative for myalgias.  Neurological:  Negative for dizziness and headaches.       Objective    BP 139/75   Pulse 60   Temp 97.6 F (36.4 C) (Oral)   Ht '5\' 10"'$  (1.778 m)   Wt 204 lb 12.8 oz (92.9 kg)   SpO2 98%   BMI 29.39 kg/m    Physical Exam Vitals reviewed.  Constitutional:      General: He is awake.     Appearance: Normal appearance. He is well-developed and well-groomed.  HENT:     Head: Normocephalic and atraumatic.      Right Ear: Hearing, tympanic membrane, ear canal and external ear normal.     Left Ear: Hearing, tympanic membrane, ear canal and external ear normal.     Mouth/Throat:     Lips: Pink.     Pharynx: Oropharynx is clear. Uvula midline. No pharyngeal swelling, oropharyngeal exudate, posterior oropharyngeal erythema or uvula swelling.  Cardiovascular:     Rate and Rhythm: Normal rate and regular rhythm.     Heart sounds: Normal heart sounds. No murmur heard.    No friction rub. No gallop.  Pulmonary:     Effort: Pulmonary effort is normal.     Breath sounds: No decreased air movement. Examination of the right-upper field reveals wheezing. Wheezing present. No decreased breath sounds, rhonchi or rales.  Abdominal:     General: Abdomen is flat. Bowel sounds are normal.     Palpations: Abdomen is soft.  Musculoskeletal:     Cervical back: Normal range of motion and neck supple. No edema. Normal range of motion.  Lymphadenopathy:     Head:     Right side of head: No submental, submandibular or preauricular adenopathy.     Left side of head: No submental, submandibular or preauricular adenopathy.     Cervical:     Right cervical: No superficial or posterior cervical adenopathy.    Left cervical: No superficial or posterior cervical adenopathy.  Neurological:     Mental Status: He is alert.  Psychiatric:        Behavior: Behavior is cooperative.       No results found for any visits on 04/29/22.  Assessment & Plan      No follow-ups on file.     Problem List Items Addressed This Visit       Respiratory   Asthma    Chronic, ongoing, likely mildly exacerbated at this time due to URI symptoms PE demonstrated mild wheezing but no rales or rhonchi or decreased air movement Will send in Prednisone 40 mg PO QD x 5 days burst to assist with breathing Recommend he use rescue inhaler as needed for acute exacerbations while recovering from URI  Follow up as needed for persistent or  worsening symptoms.       Relevant Medications   predniSONE (DELTASONE) 20 MG tablet   Other Visit Diagnoses     Upper respiratory tract infection, unspecified type    -  Primary Acute, new concern since Sunday Visit with patient indicates symptoms comprised of nasal congestion, sore throat, productive cough since Sunday congruent with acute URI that is likely viral in nature  Patient has not tested for COVID at home  COVID and Flu testing ordered today- results to dictate further management  Due  to nature and duration of symptoms recommended treatment regimen is symptomatic relief and follow up if needed Discussed with patient the various viral and bacterial etiologies of current illness and appropriate course of treatment Discussed OTC medication options for multisymptom relief such as Dayquil/Nyquil, Theraflu, AlkaSeltzer, etc. Discussed return precautions if symptoms are not improving or worsen over next 5-7 days.     Relevant Medications   predniSONE (DELTASONE) 20 MG tablet   Other Relevant Orders   Influenza a and b   Novel Coronavirus, NAA ([facility])        No follow-ups on file.   I, Evelina Lore E Teri Diltz, PA-C, have reviewed all documentation for this visit. The documentation on 04/29/22 for the exam, diagnosis, procedures, and orders are all accurate and complete.   Talitha Givens, MHS, PA-C [facility]

## 2022-04-29 NOTE — Assessment & Plan Note (Signed)
Chronic, ongoing, likely mildly exacerbated at this time due to URI symptoms PE demonstrated mild wheezing but no rales or rhonchi or decreased air movement Will send in Prednisone 40 mg PO QD x 5 days burst to assist with breathing Recommend he use rescue inhaler as needed for acute exacerbations while recovering from URI  Follow up as needed for persistent or worsening symptoms.

## 2022-04-30 ENCOUNTER — Other Ambulatory Visit: Payer: Self-pay | Admitting: Nurse Practitioner

## 2022-04-30 ENCOUNTER — Telehealth: Payer: Self-pay | Admitting: Nurse Practitioner

## 2022-04-30 DIAGNOSIS — E119 Type 2 diabetes mellitus without complications: Secondary | ICD-10-CM

## 2022-04-30 LAB — NOVEL CORONAVIRUS, NAA: SARS-CoV-2, NAA: NOT DETECTED

## 2022-04-30 NOTE — Telephone Encounter (Signed)
Pt following up on request for  glucose blood (ONETOUCH ULTRA) test strip  [place] (N), [place] - [place]

## 2022-04-30 NOTE — Telephone Encounter (Signed)
A user error has taken place: encounter opened in error, closed for administrative reasons.

## 2022-05-01 NOTE — Progress Notes (Signed)
Negative for COVID

## 2022-05-01 NOTE — Telephone Encounter (Signed)
Requested Prescriptions  Pending Prescriptions Disp Refills  . ONETOUCH ULTRA test strip [employer] Med Name: OneTouch Ultra Blue In Vitro Strip] 100 each 0    Sig: USE TO CHECK GLUCOSE TWICE DAILY. USE AS INSTRUCTED     Endocrinology: Diabetes - Testing Supplies Passed - 04/30/2022 12:54 PM      Passed - Valid encounter within last 12 months    Recent Outpatient Visits          2 days ago Upper respiratory tract infection, unspecified type   [facility] Mecum, Erin E, PA-C   1 week ago Uncontrolled type 2 diabetes mellitus with hyperglycemia, without long-term current use of insulin ([place])   [place], Jolene T, NP   2 weeks ago Uncontrolled type 2 diabetes mellitus with hyperglycemia, without long-term current use of insulin ([place])   [place], Jolene T, NP   3 months ago Uncontrolled type 2 diabetes mellitus with hyperglycemia, without long-term current use of insulin ([place])   [place], Bethel T, NP   4 months ago Hypertension associated with diabetes ([place])   [place], [name], NP      Future Appointments            In 2 months [name], [name], NP [employer], [facility]

## 2022-05-08 ENCOUNTER — Encounter (INDEPENDENT_AMBULATORY_CARE_PROVIDER_SITE_OTHER): Payer: PPO | Admitting: Ophthalmology

## 2022-05-08 DIAGNOSIS — H43821 Vitreomacular adhesion, right eye: Secondary | ICD-10-CM | POA: Diagnosis not present

## 2022-05-08 DIAGNOSIS — H353122 Nonexudative age-related macular degeneration, left eye, intermediate dry stage: Secondary | ICD-10-CM | POA: Diagnosis not present

## 2022-05-08 DIAGNOSIS — E119 Type 2 diabetes mellitus without complications: Secondary | ICD-10-CM | POA: Diagnosis not present

## 2022-05-08 DIAGNOSIS — I4819 Other persistent atrial fibrillation: Secondary | ICD-10-CM | POA: Diagnosis not present

## 2022-05-08 DIAGNOSIS — Z8673 Personal history of transient ischemic attack (TIA), and cerebral infarction without residual deficits: Secondary | ICD-10-CM | POA: Diagnosis not present

## 2022-05-08 DIAGNOSIS — H35721 Serous detachment of retinal pigment epithelium, right eye: Secondary | ICD-10-CM | POA: Diagnosis not present

## 2022-05-08 DIAGNOSIS — H353211 Exudative age-related macular degeneration, right eye, with active choroidal neovascularization: Secondary | ICD-10-CM | POA: Diagnosis not present

## 2022-05-15 ENCOUNTER — Telehealth: Payer: Self-pay

## 2022-05-15 NOTE — Progress Notes (Signed)
Chronic Care Management Pharmacy Assistant   Name: Andrew Cobb  MRN: [id number] DOB: [birth year]   Reason for Encounter: Disease State   Conditions to be addressed/monitored: HTN   Recent office visits:  05/19/22 Mecum, Dani Gobble, PA-C  (URI) Orders placed: none; Medication changes: Amoxicillin-POT Clavulanate 875-125 mg  04/29/22 Mecum, Erin E, PA-C (cough) Orders placed: Labs; Medication changes: prednisone 40 mg  04/24/22 Marnee Guarneri T, NP (Diabetes) Orders placed: none; Medication changes: none  04/15/22 [name], Henrine Screws T, NP (Diabetes) Orders placed: Labs; Medication changes: Telmisartan 80 mg  Recent consult visits:  None since last coordination call  Hospital visits:  None since last coordination call  Medications: Outpatient Encounter Medications as of 05/15/2022  Medication Sig   acyclovir (ZOVIRAX) 400 MG tablet Take 1 tablet (400 mg total) by mouth 2 (two) times daily.   albuterol (VENTOLIN HFA) 108 (90 Base) MCG/ACT inhaler Inhale 2 puffs into the lungs every 6 (six) hours as needed for wheezing or shortness of breath.   amitriptyline (ELAVIL) 25 MG tablet Take 1 tablet (25 mg total) by mouth at bedtime.   apixaban (ELIQUIS) 5 MG TABS tablet Take 1 tablet (5 mg total) by mouth 2 (two) times daily.   atorvastatin (LIPITOR) 40 MG tablet Take 1 tablet (40 mg total) by mouth at bedtime.   cetirizine (ZYRTEC) 10 MG tablet Take 10 mg by mouth daily.   Cholecalciferol (VITAMIN D) 2000 units CAPS Take 2,000 Units by mouth daily.   Cyanocobalamin (B-12) 5000 MCG CAPS Take 5,000 mcg by mouth every other day.   empagliflozin (JARDIANCE) 25 MG TABS tablet Take 1 tablet (25 mg total) by mouth daily.   fluticasone (FLONASE) 50 MCG/ACT nasal spray Place 2 sprays into both nostrils daily. (Patient taking differently: Place 2 sprays into both nostrils daily as needed for allergies.)   Lancets (ONETOUCH ULTRASOFT) lancets USE TO CHECK BLOOD SUAGR TWICE DAILY   levETIRAcetam  (KEPPRA) 500 MG tablet Take 1 tablet (500 mg total) by mouth 2 (two) times daily.   metFORMIN (GLUCOPHAGE) 1000 MG tablet Take 1 tablet (1,000 mg total) by mouth 2 (two) times daily with a meal.   Multiple Vitamins-Minerals (MULTIVITAMIN PO) Take 1 tablet by mouth daily.    ONETOUCH ULTRA test strip USE TO CHECK GLUCOSE TWICE DAILY. USE AS INSTRUCTED   tamsulosin (FLOMAX) 0.4 MG CAPS capsule Take 1 capsule (0.4 mg total) by mouth at bedtime.   telmisartan (MICARDIS) 80 MG tablet Take 1 tablet (80 mg total) by mouth daily.   Facility-Administered Encounter Medications as of 05/15/2022  Medication   0.9 %  sodium chloride infusion   0.9 %  sodium chloride infusion     Recent Office Vitals: BP Readings from Last 3 Encounters:  04/29/22 139/75  04/24/22 113/67  04/15/22 (!) 150/64   Pulse Readings from Last 3 Encounters:  04/29/22 60  04/24/22 68  04/15/22 (!) 54    Wt Readings from Last 3 Encounters:  04/29/22 204 lb 12.8 oz (92.9 kg)  04/24/22 204 lb 4.8 oz (92.7 kg)  04/15/22 203 lb 4.8 oz (92.2 kg)     Kidney Function Lab Results  Component Value Date/Time   CREATININE 1.02 04/15/2022 09:12 AM   CREATININE 1.16 02/12/2022 09:49 AM   CREATININE 0.99 07/29/2011 11:02 AM   GFR 49.88 (L) 10/14/2019 10:25 AM   GFRNONAA >60 12/11/2021 09:37 AM   GFRNONAA >60 07/29/2011 11:02 AM   GFRAA 67 08/27/2020 10:54 AM   GFRAA >60 07/29/2011  11:02 AM       Latest Ref Rng & Units 04/15/2022    9:12 AM 02/12/2022    9:49 AM 01/13/2022    9:00 AM  BMP  Glucose 70 - 99 mg/dL 128  142  211   BUN 8 - 27 mg/dL '20  28  26   '$ Creatinine 0.76 - 1.27 mg/dL 1.02  1.16  1.24   BUN/Creat Ratio 10 - '24 20  24  21   '$ Sodium 134 - 144 mmol/L 141  140  139   Potassium 3.5 - 5.2 mmol/L 4.2  4.6  4.4   Chloride 96 - 106 mmol/L 102  105  101   CO2 20 - 29 mmol/L '25  26  21   '$ Calcium 8.6 - 10.2 mg/dL 8.9  9.7  9.0    Reviewed chart prior to disease state call. Spoke with patient wife regarding  BP  Current antihypertensive regimen:  Telmisartan 80 mg 1 tab daily (for kidney protection)  How often are you checking your Blood Pressure?  Patient wife states that he takes blood pressure several times daily  Current home BP readings: This morning blood pressure was 132/72  What recent interventions/DTPs have been made by any provider to improve Blood Pressure control since last CPP Visit: none noted  Any recent hospitalizations or ED visits since last visit with CPP? No  What diet changes have been made to improve Blood Pressure Control?  Wife states that his diet is good  What exercise is being done to improve your Blood Pressure Control?  Wife states that he stays pretty active everyday  Adherence Review: Is the patient currently on ACE/ARB medication? Yes Does the patient have >5 day gap between last estimated fill dates? Yes   Care Gaps: Colonoscopy-03/26/21 Diabetic Foot Exam-10/18/21 Ophthalmology-03/13/22 Dexa Scan - NA Annual Well Visit - 07/24/21 (Medicare)  Micro albumin-NA Hemoglobin A1c- 01/13/22 (7.0)   Star Rating Drugs: Atorvastatin 40 mg-last fill 04/15/22 90 ds, 01/17/22 90 ds,  Metformin 1000 mg-last fill 04/15/22 90 ds, 11/20/21 90 ds Telmisartan 40 mg-last fill 05/11/22 90 ds, 04/21/22 30 ds   [place] [phone]

## 2022-05-16 DIAGNOSIS — M436 Torticollis: Secondary | ICD-10-CM | POA: Diagnosis not present

## 2022-05-16 DIAGNOSIS — Z72820 Sleep deprivation: Secondary | ICD-10-CM | POA: Diagnosis not present

## 2022-05-16 DIAGNOSIS — M542 Cervicalgia: Secondary | ICD-10-CM | POA: Diagnosis not present

## 2022-05-16 DIAGNOSIS — M9901 Segmental and somatic dysfunction of cervical region: Secondary | ICD-10-CM | POA: Diagnosis not present

## 2022-05-19 ENCOUNTER — Encounter: Payer: Self-pay | Admitting: Physician Assistant

## 2022-05-19 ENCOUNTER — Ambulatory Visit (INDEPENDENT_AMBULATORY_CARE_PROVIDER_SITE_OTHER): Payer: PPO | Admitting: Physician Assistant

## 2022-05-19 VITALS — BP 134/74 | HR 67 | Temp 98.1°F | Wt 204.4 lb

## 2022-05-19 DIAGNOSIS — J019 Acute sinusitis, unspecified: Secondary | ICD-10-CM

## 2022-05-19 DIAGNOSIS — M436 Torticollis: Secondary | ICD-10-CM | POA: Diagnosis not present

## 2022-05-19 DIAGNOSIS — M9901 Segmental and somatic dysfunction of cervical region: Secondary | ICD-10-CM | POA: Diagnosis not present

## 2022-05-19 DIAGNOSIS — M542 Cervicalgia: Secondary | ICD-10-CM | POA: Diagnosis not present

## 2022-05-19 DIAGNOSIS — Z72820 Sleep deprivation: Secondary | ICD-10-CM | POA: Diagnosis not present

## 2022-05-19 MED ORDER — AMOXICILLIN-POT CLAVULANATE 875-125 MG PO TABS: 1.0000 | ORAL_TABLET | Freq: Two times a day (BID) | ORAL | 0 refills | Status: AC

## 2022-05-19 NOTE — Progress Notes (Signed)
Acute Office Visit   Patient: Andrew Cobb   DOB: 1950-12-23   71 y.o. Male  MRN: [id number] Visit Date: 05/19/2022  Today's healthcare provider: Dani Gobble Jaeleen Inzunza, PA-C  Introduced myself to the patient as a Journalist, newspaper and provided education on APPs in clinical practice.    Chief Complaint  Patient presents with   URI    Patient states he is still coughing, and has congestion. Patient states he is coughing up yellow/green mucus. Patient completed prednisone. Was seen 10/24 for same symptoms.    Subjective    URI  Associated symptoms include congestion, coughing and sinus pain. Pertinent negatives include no diarrhea, ear pain, nausea, vomiting or wheezing.   HPI     URI    Additional comments: Patient states he is still coughing, and has congestion. Patient states he is coughing up yellow/green mucus. Patient completed prednisone. Was seen 10/24 for same symptoms.       Last edited by Louanna Raw, [place] on 05/19/2022 10:41 AM.       URI   Onset: Duration: several weeks  Reports coughing and congestion have not resolved since he was seen 04/29/22  States he finished his Prednisone course without improvement  States he is still feeling congested and has a productive cough  COVID and flu testing were negative  He has been using his inhaler for SOB which provides relief    Medications: Outpatient Medications Prior to Visit  Medication Sig   acyclovir (ZOVIRAX) 400 MG tablet Take 1 tablet (400 mg total) by mouth 2 (two) times daily.   albuterol (VENTOLIN HFA) 108 (90 Base) MCG/ACT inhaler Inhale 2 puffs into the lungs every 6 (six) hours as needed for wheezing or shortness of breath.   amitriptyline (ELAVIL) 25 MG tablet Take 1 tablet (25 mg total) by mouth at bedtime.   apixaban (ELIQUIS) 5 MG TABS tablet Take 1 tablet (5 mg total) by mouth 2 (two) times daily.   atorvastatin (LIPITOR) 40 MG tablet Take 1 tablet (40 mg total) by mouth at bedtime.   cetirizine  (ZYRTEC) 10 MG tablet Take 10 mg by mouth daily.   Cholecalciferol (VITAMIN D) 2000 units CAPS Take 2,000 Units by mouth daily.   Cyanocobalamin (B-12) 5000 MCG CAPS Take 5,000 mcg by mouth every other day.   empagliflozin (JARDIANCE) 25 MG TABS tablet Take 1 tablet (25 mg total) by mouth daily.   fluticasone (FLONASE) 50 MCG/ACT nasal spray Place 2 sprays into both nostrils daily. (Patient taking differently: Place 2 sprays into both nostrils daily as needed for allergies.)   Lancets (ONETOUCH ULTRASOFT) lancets USE TO CHECK BLOOD SUAGR TWICE DAILY   levETIRAcetam (KEPPRA) 500 MG tablet Take 1 tablet (500 mg total) by mouth 2 (two) times daily.   metFORMIN (GLUCOPHAGE) 1000 MG tablet Take 1 tablet (1,000 mg total) by mouth 2 (two) times daily with a meal.   Multiple Vitamins-Minerals (MULTIVITAMIN PO) Take 1 tablet by mouth daily.    ONETOUCH ULTRA test strip USE TO CHECK GLUCOSE TWICE DAILY. USE AS INSTRUCTED   tamsulosin (FLOMAX) 0.4 MG CAPS capsule Take 1 capsule (0.4 mg total) by mouth at bedtime.   telmisartan (MICARDIS) 80 MG tablet Take 1 tablet (80 mg total) by mouth daily.   Facility-Administered Medications Prior to Visit  Medication Dose Route Frequency Provider   0.9 %  sodium chloride infusion   Intravenous Continuous Vickie Epley, MD   0.9 %  sodium  chloride infusion   Intravenous Continuous Vickie Epley, MD    Review of Systems  Constitutional:  Negative for chills, fatigue and fever.  HENT:  Positive for congestion, sinus pressure and sinus pain. Negative for ear pain and postnasal drip.   Respiratory:  Positive for cough and shortness of breath. Negative for wheezing.   Gastrointestinal:  Negative for diarrhea, nausea and vomiting.  Musculoskeletal:  Negative for myalgias.       Objective    BP 134/74   Pulse 67   Temp 98.1 F (36.7 C)   Wt 204 lb 6.4 oz (92.7 kg)   SpO2 95%   BMI 29.33 kg/m    Physical Exam Vitals reviewed.  Constitutional:       General: He is awake.     Appearance: Normal appearance. He is well-developed and well-groomed.  HENT:     Head: Normocephalic and atraumatic.     Right Ear: Tympanic membrane, ear canal and external ear normal.     Left Ear: Tympanic membrane, ear canal and external ear normal.     Mouth/Throat:     Lips: Pink.     Tongue: No lesions.     Palate: No mass and lesions.     Pharynx: Oropharynx is clear. Uvula midline. No pharyngeal swelling, oropharyngeal exudate, posterior oropharyngeal erythema or uvula swelling.     Tonsils: No tonsillar exudate or tonsillar abscesses.  Cardiovascular:     Rate and Rhythm: Normal rate and regular rhythm.     Pulses: Normal pulses.     Heart sounds: Normal heart sounds. No murmur heard.    No friction rub. No gallop.  Pulmonary:     Effort: Pulmonary effort is normal.     Breath sounds: Normal breath sounds. No decreased air movement. No decreased breath sounds, wheezing, rhonchi or rales.  Lymphadenopathy:     Head:     Right side of head: No submental, submandibular or preauricular adenopathy.     Left side of head: No submental, submandibular or preauricular adenopathy.     Cervical:     Right cervical: No superficial or posterior cervical adenopathy.    Left cervical: No superficial or posterior cervical adenopathy.     Upper Body:     Right upper body: No supraclavicular adenopathy.     Left upper body: No supraclavicular adenopathy.  Neurological:     Mental Status: He is alert.  Psychiatric:        Behavior: Behavior is cooperative.       No results found for any visits on 05/19/22.  Assessment & Plan      No follow-ups on file.      Problem List Items Addressed This Visit   None Visit Diagnoses     Acute sinusitis, recurrence not specified, unspecified location    -  Primary Acute, ongoing concern Reports ongoing sinus congestion, productive coughing that did not improve with Prednisone course Will start Augmentin  875-125 mg PO BID x 7 days to assist with suspected bacterial sinus infection Recommend he continue with OTC medications for symptomatic relief and can continue to use inhalers for further breathing improvement Follow up as needed for progressing or persistent symptoms     Relevant Medications   amoxicillin-clavulanate (AUGMENTIN) 875-125 MG tablet        No follow-ups on file.   I, Genevieve Ritzel E Kryssa Risenhoover, PA-C, have reviewed all documentation for this visit. The documentation on 05/19/22 for the exam, diagnosis, procedures, and orders are  all accurate and complete.   Talitha Givens, MHS, PA-C [facility]

## 2022-05-21 DIAGNOSIS — M9901 Segmental and somatic dysfunction of cervical region: Secondary | ICD-10-CM | POA: Diagnosis not present

## 2022-05-21 DIAGNOSIS — M436 Torticollis: Secondary | ICD-10-CM | POA: Diagnosis not present

## 2022-05-21 DIAGNOSIS — Z72820 Sleep deprivation: Secondary | ICD-10-CM | POA: Diagnosis not present

## 2022-05-21 DIAGNOSIS — M542 Cervicalgia: Secondary | ICD-10-CM | POA: Diagnosis not present

## 2022-05-27 DIAGNOSIS — I2510 Atherosclerotic heart disease of native coronary artery without angina pectoris: Secondary | ICD-10-CM | POA: Diagnosis not present

## 2022-05-27 DIAGNOSIS — J189 Pneumonia, unspecified organism: Secondary | ICD-10-CM | POA: Diagnosis not present

## 2022-05-27 DIAGNOSIS — G4733 Obstructive sleep apnea (adult) (pediatric): Secondary | ICD-10-CM | POA: Diagnosis not present

## 2022-06-03 NOTE — Telephone Encounter (Signed)
Error

## 2022-06-16 ENCOUNTER — Telehealth: Payer: PPO

## 2022-06-16 ENCOUNTER — Telehealth: Payer: Self-pay

## 2022-06-16 NOTE — Telephone Encounter (Signed)
  Care Management   Follow Up Note   06/16/2022 Name: ELFEGO GIAMMARINO MRN: [id number] DOB: 1950-12-01   Referred by: [name], NP Reason for referral : Chronic Care Management   Spoke with patient's wife but she was not at home. This is his second no-show  Follow Up Plan: The patient has been provided with contact information for the care management team and has been advised to call with any health related questions or concerns.   [place] Constable, Pharm.D. - [phone]

## 2022-06-17 NOTE — Progress Notes (Addendum)
Called patient this morning to re-schedule appointment with clinical pharmacist. Unable to reach patient, left voice mail for patient or wife the appointment has been rescheduled and can look at [facility] to see new date and time.  Patient wife called back at 11:15 am, to change date and time of scheduled appointment.

## 2022-06-26 DIAGNOSIS — G4733 Obstructive sleep apnea (adult) (pediatric): Secondary | ICD-10-CM | POA: Diagnosis not present

## 2022-06-26 DIAGNOSIS — J189 Pneumonia, unspecified organism: Secondary | ICD-10-CM | POA: Diagnosis not present

## 2022-06-26 DIAGNOSIS — I2510 Atherosclerotic heart disease of native coronary artery without angina pectoris: Secondary | ICD-10-CM | POA: Diagnosis not present

## 2022-07-03 ENCOUNTER — Other Ambulatory Visit: Payer: Self-pay | Admitting: *Deleted

## 2022-07-03 DIAGNOSIS — H43821 Vitreomacular adhesion, right eye: Secondary | ICD-10-CM | POA: Diagnosis not present

## 2022-07-03 DIAGNOSIS — H353211 Exudative age-related macular degeneration, right eye, with active choroidal neovascularization: Secondary | ICD-10-CM | POA: Diagnosis not present

## 2022-07-03 DIAGNOSIS — H35721 Serous detachment of retinal pigment epithelium, right eye: Secondary | ICD-10-CM | POA: Diagnosis not present

## 2022-07-03 DIAGNOSIS — C61 Malignant neoplasm of prostate: Secondary | ICD-10-CM

## 2022-07-03 DIAGNOSIS — E119 Type 2 diabetes mellitus without complications: Secondary | ICD-10-CM | POA: Diagnosis not present

## 2022-07-03 DIAGNOSIS — H353122 Nonexudative age-related macular degeneration, left eye, intermediate dry stage: Secondary | ICD-10-CM | POA: Diagnosis not present

## 2022-07-08 ENCOUNTER — Inpatient Hospital Stay: Payer: PPO | Attending: Radiation Oncology

## 2022-07-08 ENCOUNTER — Other Ambulatory Visit: Payer: Self-pay | Admitting: *Deleted

## 2022-07-08 DIAGNOSIS — M542 Cervicalgia: Secondary | ICD-10-CM | POA: Diagnosis not present

## 2022-07-08 DIAGNOSIS — C61 Malignant neoplasm of prostate: Secondary | ICD-10-CM

## 2022-07-08 DIAGNOSIS — Z6829 Body mass index (BMI) 29.0-29.9, adult: Secondary | ICD-10-CM | POA: Diagnosis not present

## 2022-07-08 LAB — PSA: Prostatic Specific Antigen: 0.42 ng/mL (ref 0.00–4.00)

## 2022-07-10 ENCOUNTER — Ambulatory Visit: Payer: PPO | Admitting: Adult Health

## 2022-07-14 ENCOUNTER — Other Ambulatory Visit: Payer: Self-pay | Admitting: *Deleted

## 2022-07-14 ENCOUNTER — Telehealth: Payer: Self-pay

## 2022-07-14 ENCOUNTER — Ambulatory Visit
Admission: RE | Admit: 2022-07-14 | Discharge: 2022-07-14 | Disposition: A | Discharge status: Home / Self Care | Payer: PPO | Source: Ambulatory Visit | Attending: Radiation Oncology | Admitting: Radiation Oncology

## 2022-07-14 ENCOUNTER — Encounter: Payer: Self-pay | Admitting: Radiation Oncology

## 2022-07-14 VITALS — BP 123/68 | HR 75 | Temp 96.1°F | Resp 18 | Ht 70.0 in | Wt 198.3 lb

## 2022-07-14 DIAGNOSIS — C61 Malignant neoplasm of prostate: Secondary | ICD-10-CM

## 2022-07-14 DIAGNOSIS — Z923 Personal history of irradiation: Secondary | ICD-10-CM | POA: Insufficient documentation

## 2022-07-14 NOTE — Patient Instructions (Signed)
Diabetes Mellitus Basics  Diabetes mellitus, or diabetes, is a long-term (chronic) disease. It occurs when the body does not properly use sugar (glucose) that is released from food after you eat. Diabetes mellitus may be caused by one or both of these problems: Your pancreas does not make enough of a hormone called insulin. Your body does not react in a normal way to the insulin that it makes. Insulin lets glucose enter cells in your body. This gives you energy. If you have diabetes, glucose cannot get into cells. This causes high blood glucose (hyperglycemia). How to treat and manage diabetes You may need to take insulin or other diabetes medicines daily to keep your glucose in balance. If you are prescribed insulin, you will learn how to give yourself insulin by injection. You may need to adjust the amount of insulin you take based on the foods that you eat. You will need to check your blood glucose levels using a glucose monitor as told by your health care provider. The readings can help determine if you have low or high blood glucose. Generally, you should have these blood glucose levels: Before meals (preprandial): 80-130 mg/dL (4.4-7.2 mmol/L). After meals (postprandial): below 180 mg/dL (10 mmol/L). Hemoglobin A1c (HbA1c) level: less than 7%. Your health care provider will set treatment goals for you. Keep all follow-up visits. This is important. Follow these instructions at home: Diabetes medicines Take your diabetes medicines every day as told by your health care provider. List your diabetes medicines here: Name of medicine: ______________________________ Amount (dose): _______________ Time (a.m./p.m.): _______________ Notes: ___________________________________ Name of medicine: ______________________________ Amount (dose): _______________ Time (a.m./p.m.): _______________ Notes: ___________________________________ Name of medicine: ______________________________ Amount (dose):  _______________ Time (a.m./p.m.): _______________ Notes: ___________________________________ Insulin If you use insulin, list the types of insulin you use here: Insulin type: ______________________________ Amount (dose): _______________ Time (a.m./p.m.): _______________Notes: ___________________________________ Insulin type: ______________________________ Amount (dose): _______________ Time (a.m./p.m.): _______________ Notes: ___________________________________ Insulin type: ______________________________ Amount (dose): _______________ Time (a.m./p.m.): _______________ Notes: ___________________________________ Insulin type: ______________________________ Amount (dose): _______________ Time (a.m./p.m.): _______________ Notes: ___________________________________ Insulin type: ______________________________ Amount (dose): _______________ Time (a.m./p.m.): _______________ Notes: ___________________________________ Managing blood glucose  Check your blood glucose levels using a glucose monitor as told by your health care provider. Write down the times that you check your glucose levels here: Time: _______________ Notes: ___________________________________ Time: _______________ Notes: ___________________________________ Time: _______________ Notes: ___________________________________ Time: _______________ Notes: ___________________________________ Time: _______________ Notes: ___________________________________ Time: _______________ Notes: ___________________________________  Low blood glucose Low blood glucose (hypoglycemia) is when glucose is at or below 70 mg/dL (3.9 mmol/L). Symptoms may include: Feeling: Hungry. Sweaty and clammy. Irritable or easily upset. Dizzy. Sleepy. Having: A fast heartbeat. A headache. A change in your vision. Numbness around the mouth, lips, or tongue. Having trouble with: Moving (coordination). Sleeping. Treating low blood glucose To treat low blood  glucose, eat or drink something containing sugar right away. If you can think clearly and swallow safely, follow the 15:15 rule: Take 15 grams of a fast-acting carb (carbohydrate), as told by your health care provider. Some fast-acting carbs are: Glucose tablets: take 3-4 tablets. Hard candy: eat 3-5 pieces. Fruit juice: drink 4 oz (120 mL). Regular (not diet) soda: drink 4-6 oz (120-180 mL). Honey or sugar: eat 1 Tbsp (15 mL). Check your blood glucose levels 15 minutes after you take the carb. If your glucose is still at or below 70 mg/dL (3.9 mmol/L), take 15 grams of a carb again. If your glucose does not go above 70 mg/dL (3.9 mmol/L) after   3 tries, get help right away. After your glucose goes back to normal, eat a meal or a snack within 1 hour. Treating very low blood glucose If your glucose is at or below 54 mg/dL (3 mmol/L), you have very low blood glucose (severe hypoglycemia). This is an emergency. Do not wait to see if the symptoms will go away. Get medical help right away. Call your local emergency services (911 in the [place] Do not drive yourself to the hospital. Questions to ask your health care provider Should I talk with a diabetes educator? What equipment will I need to care for myself at home? What diabetes medicines do I need? When should I take them? How often do I need to check my blood glucose levels? What number can I call if I have questions? When is my follow-up visit? Where can I find a support group for people with diabetes? Where to find more information American Diabetes Association: www.diabetes.org Association of Diabetes Care and Education Specialists: www.diabeteseducator.org Contact a health care provider if: Your blood glucose is at or above 240 mg/dL (13.3 mmol/L) for 2 days in a row. You have been sick or have had a fever for 2 days or more, and you are not getting better. You have any of these problems for more than 6 hours: You cannot eat or  drink. You feel nauseous. You vomit. You have diarrhea. Get help right away if: Your blood glucose is lower than 54 mg/dL (3 mmol/L). You get confused. You have trouble thinking clearly. You have trouble breathing. These symptoms may represent a serious problem that is an emergency. Do not wait to see if the symptoms will go away. Get medical help right away. Call your local emergency services (911 in the [place] Do not drive yourself to the hospital. Summary Diabetes mellitus is a chronic disease that occurs when the body does not properly use sugar (glucose) that is released from food after you eat. Take insulin and diabetes medicines as told. Check your blood glucose every day, as often as told. Keep all follow-up visits. This is important. This information is not intended to replace advice given to you by your health care provider. Make sure you discuss any questions you have with your health care provider. Document Revised: 10/25/2019 Document Reviewed: 10/25/2019 Elsevier Patient Education  2023 Elsevier Inc.  

## 2022-07-14 NOTE — Progress Notes (Signed)
Radiation Oncology Follow up Note  Name: Andrew Cobb   Date:   07/14/2022 MRN:  [id number] DOB: 1950/08/21    This 72 y.o. 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  REFERRING PROVIDER: [name], NP  HPI: Patient is a 72 year old male now out.  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  Seen today in routine follow-up he is doing well.  He specifically denies any increased lower urinary tract symptoms diarrhea or fatigue his most recent PSA is stable at 0.42 down slightly from 0.50 back in a year ago  COMPLICATIONS OF TREATMENT: none  FOLLOW UP COMPLIANCE: keeps appointments   PHYSICAL EXAM:  BP 123/68   Pulse 75   Temp (!) 96.1 F (35.6 C)   Resp 18   Ht '5\' 10"'$  (1.778 m)   Wt 198 lb 4.8 oz (89.9 kg)   BMI 28.45 kg/m  Well-developed well-nourished patient in NAD. HEENT reveals PERLA, EOMI, discs not visualized.  Oral cavity is clear. No oral mucosal lesions are identified. Neck is clear without evidence of cervical or supraclavicular adenopathy. Lungs are clear to A&P. Cardiac examination is essentially unremarkable with regular rate and rhythm without murmur rub or thrill. Abdomen is benign with no organomegaly or masses noted. Motor sensory and DTR levels are equal and symmetric in the upper and lower extremities. Cranial nerves II through XII are grossly intact. Proprioception is intact. No peripheral adenopathy or edema is identified. No motor or sensory levels are noted. Crude visual fields are within normal range.  RADIOLOGY RESULTS: No current films for review  PLAN: At the present time patient is doing well under excellent biochemical control of his prostate cancer.  I will see him back in 1 year for follow-up with repeat PSA.  Patient is to call with any concerns.  I  would like to take this opportunity to thank you for allowing me to participate in the care of your patient.Noreene Filbert, MD

## 2022-07-14 NOTE — Progress Notes (Cosign Needed)
Care Management & Coordination Services Pharmacy Team  Reason for Encounter: Hypertension  Contacted patient on 07/14/21 to discuss hypertension disease state.   Recent office visits:  05/19/22-Erin E. Mecum, PA-C. Seen for upper respiratory infection. Will start Augmentin 875-125 mg PO BID x 7 days to assist with suspected bacterial sinus infection. 04/29/22-Erin E. Mecum, PA-C. Seen for a cough. Will send in Prednisone 40 mg PO QD x 5 days burst to assist with breathing. 04/24/22-Jolene T. Ned Card, NP (PCP) Seen for form completion. Return for as scheduled.  04/15/22-Jolene T. Ned Card, NP (PCP) Seen for general follow up visit. Will increase Telmisartan back to 80 MG daily only. Labs ordered. Flu vaccine given. Follow up in 3 months. 01/13/22-Jolene T. Ned Card, NP (PCP) Seen for general follow up visit. Labs ordered. Follow up in 3 months.  Recent consult visits:  07/08/22-Kimberly Meyran, NP [facility] neurosurgery and spine associates) Notes not available. 05/08/22-Kevin Derek Mound (Cardiology) Initial visit.  03/25/22-Jessica Darden Dates, NP (Neurology) Seen for Obstructive sleep apnea. Initiated Keppra 500 mg twice daily without recurrent seizure type activity. Follow up in 1 year. 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 Follow up in 6 months.  03/13/22-Gary A. [name], MD (Ophthalmology) Seen for Macular degeneration. faricimab-svoa (VABYSMO) '6mg'$ /0.35m intravitreal injection given. Follow up in 8 weeks. 02/12/22-Jill DOphelia Shoulder NP (Cardiology) Seen for follow up visit. Labs ordered. EKG ordered. [facility] not available.  01/30/22-Gary A. [name], MD (Ophthalmology) Seen for macular degeneration. faricimab-svoa (VABYSMO) '6mg'$ /0.093mintravitreal injection given. Follow up in 6 weeks.  Hospital visits:  None in previous 6 months  Medications: Outpatient Encounter Medications as of 07/14/2022   Medication Sig   acyclovir (ZOVIRAX) 400 MG tablet Take 1 tablet (400 mg total) by mouth 2 (two) times daily.   albuterol (VENTOLIN HFA) 108 (90 Base) MCG/ACT inhaler Inhale 2 puffs into the lungs every 6 (six) hours as needed for wheezing or shortness of breath.   amitriptyline (ELAVIL) 25 MG tablet Take 1 tablet (25 mg total) by mouth at bedtime.   apixaban (ELIQUIS) 5 MG TABS tablet Take 1 tablet (5 mg total) by mouth 2 (two) times daily.   atorvastatin (LIPITOR) 40 MG tablet Take 1 tablet (40 mg total) by mouth at bedtime.   cetirizine (ZYRTEC) 10 MG tablet Take 10 mg by mouth daily.   Cholecalciferol (VITAMIN D) 2000 units CAPS Take 2,000 Units by mouth daily.   Cyanocobalamin (B-12) 5000 MCG CAPS Take 5,000 mcg by mouth every other day.   empagliflozin (JARDIANCE) 25 MG TABS tablet Take 1 tablet (25 mg total) by mouth daily.   fluticasone (FLONASE) 50 MCG/ACT nasal spray Place 2 sprays into both nostrils daily. (Patient taking differently: Place 2 sprays into both nostrils daily as needed for allergies.)   Lancets (ONETOUCH ULTRASOFT) lancets USE TO CHECK BLOOD SUAGR TWICE DAILY   levETIRAcetam (KEPPRA) 500 MG tablet Take 1 tablet (500 mg total) by mouth 2 (two) times daily.   metFORMIN (GLUCOPHAGE) 1000 MG tablet Take 1 tablet (1,000 mg total) by mouth 2 (two) times daily with a meal.   Multiple Vitamins-Minerals (MULTIVITAMIN PO) Take 1 tablet by mouth daily.    ONETOUCH ULTRA test strip USE TO CHECK GLUCOSE TWICE DAILY. USE AS INSTRUCTED   tamsulosin (FLOMAX) 0.4 MG CAPS capsule Take 1 capsule (0.4 mg total) by mouth at bedtime.   telmisartan (MICARDIS) 80 MG tablet Take 1 tablet (80 mg total) by mouth daily.   Facility-Administered Encounter  Medications as of 07/14/2022  Medication   0.9 %  sodium chloride infusion   0.9 %  sodium chloride infusion    Recent Office Vitals: BP Readings from Last 3 Encounters:  07/14/22 123/68  05/19/22 134/74  04/29/22 139/75   Pulse Readings  from Last 3 Encounters:  07/14/22 75  05/19/22 67  04/29/22 60    Wt Readings from Last 3 Encounters:  07/14/22 198 lb 4.8 oz (89.9 kg)  05/19/22 204 lb 6.4 oz (92.7 kg)  04/29/22 204 lb 12.8 oz (92.9 kg)     Kidney Function Lab Results  Component Value Date/Time   CREATININE 1.02 04/15/2022 09:12 AM   CREATININE 1.16 02/12/2022 09:49 AM   CREATININE 0.99 07/29/2011 11:02 AM   GFR 49.88 (L) 10/14/2019 10:25 AM   GFRNONAA >60 12/11/2021 09:37 AM   GFRNONAA >60 07/29/2011 11:02 AM   GFRAA 67 08/27/2020 10:54 AM   GFRAA >60 07/29/2011 11:02 AM       Latest Ref Rng & Units 04/15/2022    9:12 AM 02/12/2022    9:49 AM 01/13/2022    9:00 AM  BMP  Glucose 70 - 99 mg/dL 128  142  211   BUN 8 - 27 mg/dL '20  28  26   '$ Creatinine 0.76 - 1.27 mg/dL 1.02  1.16  1.24   BUN/Creat Ratio 10 - '24 20  24  21   '$ Sodium 134 - 144 mmol/L 141  140  139   Potassium 3.5 - 5.2 mmol/L 4.2  4.6  4.4   Chloride 96 - 106 mmol/L 102  105  101   CO2 20 - 29 mmol/L '25  26  21   '$ Calcium 8.6 - 10.2 mg/dL 8.9  9.7  9.0      Current antihypertensive regimen:  Telmisartan 80 mg 1 tab daily  Unsuccessful attempts to complete assessment call. I have called patient 3x and left 3 voicemail's for the patient to return my call when available.   Adherence Review: Is the patient currently on ACE/ARB medication? No Does the patient have >5 day gap between last estimated fill dates? No  Star Rating Drugs:  Atorvastatin 40 mg Last filled:07/04/22 90 DS, 04/21/22 90 DS Jardiance 25 mg Last filled:N/a Metformin 1000 mg Last filled:04/21/22 90 DS, 02/06/22 90 DS Telmisartan 80 mg Last filled:05/14/22 90 DS, 05/11/22 90 DS    Myriam Elta Guadeloupe, RMA

## 2022-07-15 DIAGNOSIS — Z191 Hormone sensitive malignancy status: Secondary | ICD-10-CM | POA: Diagnosis not present

## 2022-07-15 DIAGNOSIS — C61 Malignant neoplasm of prostate: Secondary | ICD-10-CM | POA: Diagnosis not present

## 2022-07-16 ENCOUNTER — Ambulatory Visit (INDEPENDENT_AMBULATORY_CARE_PROVIDER_SITE_OTHER): Payer: PPO | Admitting: Nurse Practitioner

## 2022-07-16 VITALS — BP 113/74 | HR 76 | Temp 97.7°F | Ht 70.0 in | Wt 199.1 lb

## 2022-07-16 DIAGNOSIS — J4520 Mild intermittent asthma, uncomplicated: Secondary | ICD-10-CM

## 2022-07-16 DIAGNOSIS — E1165 Type 2 diabetes mellitus with hyperglycemia: Secondary | ICD-10-CM | POA: Diagnosis not present

## 2022-07-16 DIAGNOSIS — I4819 Other persistent atrial fibrillation: Secondary | ICD-10-CM

## 2022-07-16 DIAGNOSIS — E1169 Type 2 diabetes mellitus with other specified complication: Secondary | ICD-10-CM | POA: Diagnosis not present

## 2022-07-16 DIAGNOSIS — E785 Hyperlipidemia, unspecified: Secondary | ICD-10-CM | POA: Diagnosis not present

## 2022-07-16 DIAGNOSIS — R569 Unspecified convulsions: Secondary | ICD-10-CM | POA: Diagnosis not present

## 2022-07-16 DIAGNOSIS — H353211 Exudative age-related macular degeneration, right eye, with active choroidal neovascularization: Secondary | ICD-10-CM | POA: Diagnosis not present

## 2022-07-16 DIAGNOSIS — I152 Hypertension secondary to endocrine disorders: Secondary | ICD-10-CM

## 2022-07-16 DIAGNOSIS — G4733 Obstructive sleep apnea (adult) (pediatric): Secondary | ICD-10-CM | POA: Diagnosis not present

## 2022-07-16 DIAGNOSIS — D696 Thrombocytopenia, unspecified: Secondary | ICD-10-CM

## 2022-07-16 DIAGNOSIS — D6869 Other thrombophilia: Secondary | ICD-10-CM | POA: Diagnosis not present

## 2022-07-16 DIAGNOSIS — E1159 Type 2 diabetes mellitus with other circulatory complications: Secondary | ICD-10-CM

## 2022-07-16 DIAGNOSIS — Z8546 Personal history of malignant neoplasm of prostate: Secondary | ICD-10-CM

## 2022-07-16 DIAGNOSIS — I700 Atherosclerosis of aorta: Secondary | ICD-10-CM

## 2022-07-16 DIAGNOSIS — E559 Vitamin D deficiency, unspecified: Secondary | ICD-10-CM

## 2022-07-16 DIAGNOSIS — D692 Other nonthrombocytopenic purpura: Secondary | ICD-10-CM | POA: Diagnosis not present

## 2022-07-16 DIAGNOSIS — Z8673 Personal history of transient ischemic attack (TIA), and cerebral infarction without residual deficits: Secondary | ICD-10-CM

## 2022-07-16 DIAGNOSIS — M5412 Radiculopathy, cervical region: Secondary | ICD-10-CM

## 2022-07-16 LAB — MICROALBUMIN, URINE WAIVED
Creatinine, Urine Waived: 100 mg/dL (ref 10–300)
Microalb, Ur Waived: 80 mg/L — ABNORMAL HIGH (ref 0–19)

## 2022-07-16 LAB — BAYER DCA HB A1C WAIVED: HB A1C (BAYER DCA - WAIVED): 7.8 % — ABNORMAL HIGH (ref 4.8–5.6)

## 2022-07-16 MED ORDER — TELMISARTAN 80 MG PO TABS: 80.0000 mg | ORAL_TABLET | Freq: Every day | ORAL | 4 refills | Status: DC

## 2022-07-16 NOTE — Assessment & Plan Note (Addendum)
Chronic, ongoing with A1c 7.8% recent visit, trend up due to diet indiscretions and prednisone.  Urine ALB 80 January 2024.  Continue Telmisartan for kidney protection. Neuro goal <6.5%, recommend he take medication daily and focus heavily on diet -- as often at goal.  Continue  Metformin 1000 MG BID and Jardiance 25 MG - obtains via assistance (new papers obtained and will fax).  May need to consider GLP1 next visit if elevations continues.  Recommend he check BS twice daily + continue focus on regular exercise and diet.  At this time he wishes to maintain regimen and get back to exercising. - ARB on board + statin - Eye and foot exam up to date.

## 2022-07-16 NOTE — Assessment & Plan Note (Signed)
Chronic, ongoing due to Eliquis use with A-fib.  Monitor skin for bruising and increased bleeding.

## 2022-07-16 NOTE — Assessment & Plan Note (Signed)
Chronic, stable.  Noted on past labs, check CBC today.

## 2022-07-16 NOTE — Assessment & Plan Note (Signed)
Chronic, stable.  Being followed by neurology.  Continue current Keppra dose as prescribed by them and collaboration with neurology, reviewed notes.  Continue Amitriptyline for history of headaches post CVA.

## 2022-07-16 NOTE — Assessment & Plan Note (Signed)
Currently no treatment, continue collaboration with oncology and urology.  Recent notes reviewed.
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
## 2022-07-16 NOTE — Assessment & Plan Note (Signed)
Continue collaboration with neurology due to significant stroke history.  Continue current medication regimen.

## 2022-07-16 NOTE — Assessment & Plan Note (Signed)
Chronic, ongoing with cardioversion on 07/04/21 + Watchmen attempt 03/06/22 - next surgery is upcoming on Friday.  Continue collaboration with cardiology.  At this time continue Eliquis as ordered.

## 2022-07-16 NOTE — Assessment & Plan Note (Signed)
Chronic, ongoing.  Stable at this time.  Continue current medication regimen and collaboration with pulmonary as needed.

## 2022-07-16 NOTE — Assessment & Plan Note (Signed)
Chronic, ongoing, followed by opthalmology.  Continue this collaboration.

## 2022-07-16 NOTE — Assessment & Plan Note (Signed)
Chronic, ongoing.  Continue tight lipid control, may need to increase Atorvastatin to 80 MG if ever elevation, recent LDL below goal for stroke prevention.  Continue current Atorvastatin dosing.  Lipid check today.

## 2022-07-16 NOTE — Assessment & Plan Note (Signed)
Chronic, stable with 100% use of CPAP.  Praised for this.

## 2022-07-16 NOTE — Assessment & Plan Note (Signed)
Chronic, improved on recent labs.  Continue daily supplement and adjust as needed. Recheck today.

## 2022-07-16 NOTE — Assessment & Plan Note (Signed)
Chronic.  Noted on imaging 07/11/20 and previous imaging.  Recommend continue daily statin therapy and ASA for prevention.

## 2022-07-16 NOTE — Assessment & Plan Note (Signed)
Chronic, ongoing with BP below neuro goal <130/80 in office and at home.  Recommend he monitor BP at home daily and document.  Will continue Telmisartan 80 MG daily only.  Continue focus on DASH diet and regular exercise at home.  LABS: CBC, TSH, CMP.  Continue collaboration with cardiology.  Recommend he wear compression hose at home to avoid syncopal episodes and orthostatic BP.

## 2022-07-16 NOTE — Assessment & Plan Note (Signed)
Chronic.  Noted on exam, continue to monitor and recommend gentle skin care at home + notify provider if skin breakdown presents.

## 2022-07-17 NOTE — Progress Notes (Signed)
Contacted via MyChart   Good evening Andrew Cobb, your labs have returned: - CBC shows no anemia or infection, but platelets are still on low side, we will monitor this closely.   - Kidney function, creatinine and eGFR, is showing some mild kidney disease this check we will recheck next visit.  Ensure good water intake.  Liver function shows mild elevation in ALT, but this may be related to sample -- we will recheck next visit. - Awaiting Vitamin D level, but remainder of labs stable.  Good luck in surgery tomorrow!!!  Any questions? Keep being stellar!!  Thank you for allowing me to participate in your care.  I appreciate you. Kindest regards, Ulanda Tackett

## 2022-07-18 DIAGNOSIS — M1990 Unspecified osteoarthritis, unspecified site: Secondary | ICD-10-CM | POA: Diagnosis not present

## 2022-07-18 DIAGNOSIS — M19019 Primary osteoarthritis, unspecified shoulder: Secondary | ICD-10-CM | POA: Diagnosis not present

## 2022-07-18 DIAGNOSIS — D6869 Other thrombophilia: Secondary | ICD-10-CM | POA: Diagnosis not present

## 2022-07-18 DIAGNOSIS — D696 Thrombocytopenia, unspecified: Secondary | ICD-10-CM | POA: Diagnosis not present

## 2022-07-18 DIAGNOSIS — Z4509 Encounter for adjustment and management of other cardiac device: Secondary | ICD-10-CM | POA: Diagnosis not present

## 2022-07-18 DIAGNOSIS — I4819 Other persistent atrial fibrillation: Secondary | ICD-10-CM | POA: Diagnosis not present

## 2022-07-18 DIAGNOSIS — Z955 Presence of coronary angioplasty implant and graft: Secondary | ICD-10-CM | POA: Diagnosis not present

## 2022-07-18 DIAGNOSIS — G4733 Obstructive sleep apnea (adult) (pediatric): Secondary | ICD-10-CM | POA: Diagnosis not present

## 2022-07-18 DIAGNOSIS — R001 Bradycardia, unspecified: Secondary | ICD-10-CM | POA: Diagnosis not present

## 2022-07-18 DIAGNOSIS — Z881 Allergy status to other antibiotic agents status: Secondary | ICD-10-CM | POA: Diagnosis not present

## 2022-07-18 DIAGNOSIS — Z7901 Long term (current) use of anticoagulants: Secondary | ICD-10-CM | POA: Diagnosis not present

## 2022-07-18 DIAGNOSIS — N400 Enlarged prostate without lower urinary tract symptoms: Secondary | ICD-10-CM | POA: Diagnosis not present

## 2022-07-18 DIAGNOSIS — Z882 Allergy status to sulfonamides status: Secondary | ICD-10-CM | POA: Diagnosis not present

## 2022-07-18 DIAGNOSIS — E119 Type 2 diabetes mellitus without complications: Secondary | ICD-10-CM | POA: Diagnosis not present

## 2022-07-18 DIAGNOSIS — M1710 Unilateral primary osteoarthritis, unspecified knee: Secondary | ICD-10-CM | POA: Diagnosis not present

## 2022-07-18 DIAGNOSIS — G4730 Sleep apnea, unspecified: Secondary | ICD-10-CM | POA: Diagnosis not present

## 2022-07-18 DIAGNOSIS — I10 Essential (primary) hypertension: Secondary | ICD-10-CM | POA: Diagnosis not present

## 2022-07-18 DIAGNOSIS — Z8673 Personal history of transient ischemic attack (TIA), and cerebral infarction without residual deficits: Secondary | ICD-10-CM | POA: Diagnosis not present

## 2022-07-18 DIAGNOSIS — E785 Hyperlipidemia, unspecified: Secondary | ICD-10-CM | POA: Diagnosis not present

## 2022-07-18 DIAGNOSIS — I2510 Atherosclerotic heart disease of native coronary artery without angina pectoris: Secondary | ICD-10-CM | POA: Diagnosis not present

## 2022-07-18 DIAGNOSIS — Z9989 Dependence on other enabling machines and devices: Secondary | ICD-10-CM | POA: Diagnosis not present

## 2022-07-18 DIAGNOSIS — Z79899 Other long term (current) drug therapy: Secondary | ICD-10-CM | POA: Diagnosis not present

## 2022-07-18 DIAGNOSIS — Z006 Encounter for examination for normal comparison and control in clinical research program: Secondary | ICD-10-CM | POA: Diagnosis not present

## 2022-07-18 DIAGNOSIS — Z923 Personal history of irradiation: Secondary | ICD-10-CM | POA: Diagnosis not present

## 2022-07-18 DIAGNOSIS — B009 Herpesviral infection, unspecified: Secondary | ICD-10-CM | POA: Diagnosis not present

## 2022-07-18 DIAGNOSIS — Z95818 Presence of other cardiac implants and grafts: Secondary | ICD-10-CM | POA: Diagnosis not present

## 2022-07-18 DIAGNOSIS — Z8546 Personal history of malignant neoplasm of prostate: Secondary | ICD-10-CM | POA: Diagnosis not present

## 2022-07-18 DIAGNOSIS — Z7984 Long term (current) use of oral hypoglycemic drugs: Secondary | ICD-10-CM | POA: Diagnosis not present

## 2022-07-18 DIAGNOSIS — Z981 Arthrodesis status: Secondary | ICD-10-CM | POA: Diagnosis not present

## 2022-07-18 LAB — CBC WITH DIFFERENTIAL/PLATELET
Basophils Absolute: 0 10*3/uL (ref 0.0–0.2)
Basos: 0 %
EOS (ABSOLUTE): 0.1 10*3/uL (ref 0.0–0.4)
Eos: 2 %
Hematocrit: 43.4 % (ref 37.5–51.0)
Hemoglobin: 14.1 g/dL (ref 13.0–17.7)
Immature Grans (Abs): 0 10*3/uL (ref 0.0–0.1)
Immature Granulocytes: 1 %
Lymphocytes Absolute: 0.6 10*3/uL — ABNORMAL LOW (ref 0.7–3.1)
Lymphs: 9 %
MCH: 31.6 pg (ref 26.6–33.0)
MCHC: 32.5 g/dL (ref 31.5–35.7)
MCV: 97 fL (ref 79–97)
Monocytes Absolute: 0.5 10*3/uL (ref 0.1–0.9)
Monocytes: 8 %
Neutrophils Absolute: 5.1 10*3/uL (ref 1.4–7.0)
Neutrophils: 80 %
Platelets: 129 10*3/uL — ABNORMAL LOW (ref 150–450)
RBC: 4.46 x10E6/uL (ref 4.14–5.80)
RDW: 11.9 % (ref 11.6–15.4)
WBC: 6.3 10*3/uL (ref 3.4–10.8)

## 2022-07-18 LAB — COMPREHENSIVE METABOLIC PANEL
ALT: 70 IU/L — ABNORMAL HIGH (ref 0–44)
AST: 40 IU/L (ref 0–40)
Albumin/Globulin Ratio: 2.4 — ABNORMAL HIGH (ref 1.2–2.2)
Albumin: 4.7 g/dL (ref 3.8–4.8)
Alkaline Phosphatase: 96 IU/L (ref 44–121)
BUN/Creatinine Ratio: 21 (ref 10–24)
BUN: 29 mg/dL — ABNORMAL HIGH (ref 8–27)
Bilirubin Total: 0.7 mg/dL (ref 0.0–1.2)
CO2: 15 mmol/L — ABNORMAL LOW (ref 20–29)
Calcium: 9.4 mg/dL (ref 8.6–10.2)
Chloride: 104 mmol/L (ref 96–106)
Creatinine, Ser: 1.36 mg/dL — ABNORMAL HIGH (ref 0.76–1.27)
Globulin, Total: 2 g/dL (ref 1.5–4.5)
Glucose: 144 mg/dL — ABNORMAL HIGH (ref 70–99)
Potassium: 4.7 mmol/L (ref 3.5–5.2)
Sodium: 141 mmol/L (ref 134–144)
Total Protein: 6.7 g/dL (ref 6.0–8.5)
eGFR: 56 mL/min/{1.73_m2} — ABNORMAL LOW (ref >59)

## 2022-07-18 LAB — LIPID PANEL W/O CHOL/HDL RATIO
Cholesterol, Total: 126 mg/dL (ref 100–199)
HDL: 66 mg/dL (ref >39)
LDL Chol Calc (NIH): 40 mg/dL (ref 0–99)
Triglycerides: 110 mg/dL (ref 0–149)
VLDL Cholesterol Cal: 20 mg/dL (ref 5–40)

## 2022-07-18 LAB — VITAMIN D 25 HYDROXY (VIT D DEFICIENCY, FRACTURES): Vit D, 25-Hydroxy: 35.6 ng/mL (ref 30.0–100.0)

## 2022-07-18 LAB — TSH: TSH: 1.81 u[IU]/mL (ref 0.450–4.500)

## 2022-07-21 ENCOUNTER — Telehealth: Payer: Self-pay

## 2022-07-21 NOTE — Patient Outreach (Signed)
  Care Coordination [facility] Note Transition Care Management Unsuccessful Follow-up Telephone Call  Date of discharge and from where:  [facility] 07/19/22  Attempts:  1st Attempt  Reason for unsuccessful TCM follow-up call:  No answer/busy  Johnney Killian, RN, BSN, CCM Care Management Coordinator [facility] Phone: [phone]: [phone]

## 2022-07-23 ENCOUNTER — Telehealth: Payer: Self-pay

## 2022-07-23 ENCOUNTER — Telehealth: Payer: Self-pay | Admitting: Nurse Practitioner

## 2022-07-23 NOTE — Telephone Encounter (Signed)
Jolene do you want Patient to come in for a follow up with you

## 2022-07-23 NOTE — Telephone Encounter (Signed)
Please call patient and set up appt sooner than April for a follow up on procedure done at [facility]. Please and Thank you

## 2022-07-23 NOTE — Patient Outreach (Addendum)
  Care Coordination [facility] Note Transition Care Management Unsuccessful Follow-up Telephone Call  Date of discharge and from where:  [facility] 07/19/21  Attempts:  1st Attempt  Reason for unsuccessful TCM follow-up call:  No answer/busy  Johnney Killian, RN, BSN, CCM Care Management Coordinator [facility] Phone: [phone]: [phone]

## 2022-07-23 NOTE — Patient Outreach (Signed)
  Care Coordination [facility] Note Transition Care Management Unsuccessful Follow-up Telephone Call  Date of discharge and from where:  [facility] 07/19/22  Attempts:  3rd Attempt  Reason for unsuccessful TCM follow-up call:  No answer/busy  Johnney Killian, RN, BSN, CCM Care Management Coordinator [facility] Phone: [phone]: [phone]

## 2022-07-23 NOTE — Patient Outreach (Signed)
  Care Coordination [facility] Note Transition Care Management Unsuccessful Follow-up Telephone Call  Date of discharge and from where:  [facility] 07/19/22  Attempts:  3rd Attempt  Reason for unsuccessful TCM follow-up call:  No answer/busy  Johnney Killian, RN, BSN, CCM Care Management Coordinator [facility] Phone: [phone]: [phone]

## 2022-07-23 NOTE — Telephone Encounter (Signed)
Patients wife [name] states that patient had is ambulant procedure done and his next appointment at [facility] is 10/08/2022. Patient was advised to contact his PCP to see if his PCP would like form him to have an appointment so that she can see him as well.  Please advise [name] if patient is needing an appointment with Marnee Guarneri (PCP)

## 2022-07-24 DIAGNOSIS — M542 Cervicalgia: Secondary | ICD-10-CM | POA: Diagnosis not present

## 2022-07-24 DIAGNOSIS — Z6828 Body mass index (BMI) 28.0-28.9, adult: Secondary | ICD-10-CM | POA: Diagnosis not present

## 2022-07-24 NOTE — Telephone Encounter (Signed)
LVM asking patient to call back to schedule an appointment 

## 2022-07-25 NOTE — Telephone Encounter (Signed)
2nd attempt to reach patient.

## 2022-07-27 DIAGNOSIS — J189 Pneumonia, unspecified organism: Secondary | ICD-10-CM | POA: Diagnosis not present

## 2022-07-27 DIAGNOSIS — I2510 Atherosclerotic heart disease of native coronary artery without angina pectoris: Secondary | ICD-10-CM | POA: Diagnosis not present

## 2022-07-27 DIAGNOSIS — G4733 Obstructive sleep apnea (adult) (pediatric): Secondary | ICD-10-CM | POA: Diagnosis not present

## 2022-07-30 ENCOUNTER — Ambulatory Visit: Payer: PPO | Admitting: Nurse Practitioner

## 2022-07-30 DIAGNOSIS — M542 Cervicalgia: Secondary | ICD-10-CM | POA: Diagnosis not present

## 2022-07-31 DIAGNOSIS — Z95818 Presence of other cardiac implants and grafts: Secondary | ICD-10-CM | POA: Insufficient documentation

## 2022-08-02 NOTE — Patient Instructions (Signed)

## 2022-08-05 ENCOUNTER — Ambulatory Visit (INDEPENDENT_AMBULATORY_CARE_PROVIDER_SITE_OTHER): Payer: PPO | Admitting: Nurse Practitioner

## 2022-08-05 ENCOUNTER — Encounter: Payer: Self-pay | Admitting: Nurse Practitioner

## 2022-08-05 VITALS — BP 134/73 | HR 68 | Temp 97.6°F | Ht 70.0 in | Wt 203.0 lb

## 2022-08-05 DIAGNOSIS — Z95818 Presence of other cardiac implants and grafts: Secondary | ICD-10-CM

## 2022-08-05 DIAGNOSIS — I4819 Other persistent atrial fibrillation: Secondary | ICD-10-CM

## 2022-08-05 NOTE — Assessment & Plan Note (Signed)
Overall stable at this time, placed 07/18/22.  Continue collaboration with cardiology.

## 2022-08-05 NOTE — Assessment & Plan Note (Signed)
Chronic, ongoing with cardioversion on 07/04/21 + Amulet procedure 07/18/22.  Continue collaboration with cardiology.  At this time continue ASA and Plavix.

## 2022-08-05 NOTE — Progress Notes (Signed)
BP 134/73   Pulse 68   Temp 97.6 F (36.4 C) (Tympanic)   Ht '5\' 10"'$  (1.778 m)   Wt 203 lb (92.1 kg)   SpO2 99%   BMI 29.13 kg/m    Subjective:    Patient ID: Andrew Cobb, male    DOB: 1951/03/02, 72 y.o.   MRN: [id number]  HPI: Andrew Cobb is a 72 y.o. male  Chief Complaint  Patient presents with   Atrial Fibrillation   ATRIAL FIBRILLATION Follow-up visit today for recent Amulet procedure on 07/18/22 -- they stopped Eliquis and started Plavix/ASA.  Right leg remains somewhat bruised from procedure, but this is improving.  He reports overall healing well.  Has nurse coming to home every 6 months. Atrial fibrillation status: stable Satisfied with current treatment: yes  Medication side effects:  no Medication compliance: good compliance Etiology of atrial fibrillation: unknown Palpitations:  no Chest pain:  no Dyspnea on exertion:  no Orthopnea:  no Syncope:  no Edema:  no Ventricular rate control: Not indicated Anti-coagulation: aspirin   Relevant past medical, surgical, family and social history reviewed and updated as indicated. Interim medical history since our last visit reviewed. Allergies and medications reviewed and updated.  Review of Systems  Constitutional:  Negative for activity change, diaphoresis, fatigue and fever.  Respiratory:  Negative for cough, chest tightness, shortness of breath and wheezing.   Cardiovascular:  Negative for chest pain, palpitations and leg swelling.  Gastrointestinal: Negative.   Neurological: Negative.   Psychiatric/Behavioral: Negative.      Per HPI unless specifically indicated above     Objective:    BP 134/73   Pulse 68   Temp 97.6 F (36.4 C) (Tympanic)   Ht '5\' 10"'$  (1.778 m)   Wt 203 lb (92.1 kg)   SpO2 99%   BMI 29.13 kg/m   Wt Readings from Last 3 Encounters:  08/05/22 203 lb (92.1 kg)  07/16/22 199 lb 1.6 oz (90.3 kg)  07/14/22 198 lb 4.8 oz (89.9 kg)    Physical Exam Vitals and nursing note  reviewed.  Constitutional:      General: He is awake. He is not in acute distress.    Appearance: He is well-developed and well-groomed. He is not ill-appearing.  HENT:     Head: Normocephalic and atraumatic.     Right Ear: Hearing normal. No drainage.     Left Ear: Hearing normal. No drainage.  Eyes:     General: Lids are normal.        Right eye: No discharge.        Left eye: No discharge.     Conjunctiva/sclera: Conjunctivae normal.     Pupils: Pupils are equal, round, and reactive to light.  Neck:     Thyroid: No thyromegaly.     Vascular: No carotid bruit or JVD.     Trachea: Trachea normal.  Cardiovascular:     Rate and Rhythm: Normal rate and regular rhythm.     Heart sounds: Normal heart sounds, S1 normal and S2 normal. No murmur heard.    No gallop.  Pulmonary:     Effort: Pulmonary effort is normal. No accessory muscle usage or respiratory distress.     Breath sounds: Normal breath sounds.  Abdominal:     General: Bowel sounds are normal.     Palpations: Abdomen is soft. There is no hepatomegaly or splenomegaly.  Musculoskeletal:        General: Normal range of motion.  Cervical back: Normal range of motion and neck supple.     Right lower leg: No edema.     Left lower leg: No edema.  Skin:    General: Skin is warm and dry.     Capillary Refill: Capillary refill takes less than 2 seconds.  Neurological:     Mental Status: He is alert and oriented to person, place, and time.     Deep Tendon Reflexes: Reflexes are normal and symmetric.  Psychiatric:        Attention and Perception: Attention normal.        Mood and Affect: Mood normal.        Speech: Speech normal.        Behavior: Behavior normal. Behavior is cooperative.        Thought Content: Thought content normal.     Results for orders placed or performed in visit on 07/16/22  Bayer DCA Hb A1c Waived  Result Value Ref Range   HB A1C (BAYER DCA - WAIVED) 7.8 (H) 4.8 - 5.6 %  Microalbumin, Urine  Waived  Result Value Ref Range   Microalb, Ur Waived 80 (H) 0 - 19 mg/L   Creatinine, Urine Waived 100 10 - 300 mg/dL   Microalb/Creat Ratio 30-300 (H) <30 mg/g  CBC with Differential/Platelet  Result Value Ref Range   WBC 6.3 3.4 - 10.8 x10E3/uL   RBC 4.46 4.14 - 5.80 x10E6/uL   Hemoglobin 14.1 13.0 - 17.7 g/dL   Hematocrit 43.4 37.5 - 51.0 %   MCV 97 79 - 97 fL   MCH 31.6 26.6 - 33.0 pg   MCHC 32.5 31.5 - 35.7 g/dL   RDW 11.9 11.6 - 15.4 %   Platelets 129 (L) 150 - 450 x10E3/uL   Neutrophils 80 Not Estab. %   Lymphs 9 Not Estab. %   Monocytes 8 Not Estab. %   Eos 2 Not Estab. %   Basos 0 Not Estab. %   Neutrophils Absolute 5.1 1.4 - 7.0 x10E3/uL   Lymphocytes Absolute 0.6 (L) 0.7 - 3.1 x10E3/uL   Monocytes Absolute 0.5 0.1 - 0.9 x10E3/uL   EOS (ABSOLUTE) 0.1 0.0 - 0.4 x10E3/uL   Basophils Absolute 0.0 0.0 - 0.2 x10E3/uL   Immature Granulocytes 1 Not Estab. %   Immature Grans (Abs) 0.0 0.0 - 0.1 x10E3/uL  Comprehensive metabolic panel  Result Value Ref Range   Glucose 144 (H) 70 - 99 mg/dL   BUN 29 (H) 8 - 27 mg/dL   Creatinine, Ser 1.36 (H) 0.76 - 1.27 mg/dL   eGFR 56 (L) >59 mL/min/1.73   BUN/Creatinine Ratio 21 10 - 24   Sodium 141 134 - 144 mmol/L   Potassium 4.7 3.5 - 5.2 mmol/L   Chloride 104 96 - 106 mmol/L   CO2 15 (L) 20 - 29 mmol/L   Calcium 9.4 8.6 - 10.2 mg/dL   Total Protein 6.7 6.0 - 8.5 g/dL   Albumin 4.7 3.8 - 4.8 g/dL   Globulin, Total 2.0 1.5 - 4.5 g/dL   Albumin/Globulin Ratio 2.4 (H) 1.2 - 2.2   Bilirubin Total 0.7 0.0 - 1.2 mg/dL   Alkaline Phosphatase 96 44 - 121 IU/L   AST 40 0 - 40 IU/L   ALT 70 (H) 0 - 44 IU/L  TSH  Result Value Ref Range   TSH 1.810 0.450 - 4.500 uIU/mL  Lipid Panel w/o Chol/HDL Ratio  Result Value Ref Range   Cholesterol, Total 126 100 - 199 mg/dL  Triglycerides 110 0 - 149 mg/dL   HDL 66 >39 mg/dL   VLDL Cholesterol Cal 20 5 - 40 mg/dL   LDL Chol Calc (NIH) 40 0 - 99 mg/dL  VITAMIN D 25 Hydroxy (Vit-D Deficiency,  Fractures)  Result Value Ref Range   Vit D, 25-Hydroxy 35.6 30.0 - 100.0 ng/mL      Assessment & Plan:   Problem List Items Addressed This Visit       Cardiovascular and Mediastinum   Persistent atrial fibrillation ([place]) - Primary    Chronic, ongoing with cardioversion on 07/04/21 + Amulet procedure 07/18/22.  Continue collaboration with cardiology.  At this time continue ASA and Plavix.      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  Continue collaboration with cardiology.        Follow up plan: Return for as scheduled in April.

## 2022-08-12 DIAGNOSIS — Z6828 Body mass index (BMI) 28.0-28.9, adult: Secondary | ICD-10-CM | POA: Diagnosis not present

## 2022-08-12 DIAGNOSIS — M542 Cervicalgia: Secondary | ICD-10-CM | POA: Diagnosis not present

## 2022-08-13 DIAGNOSIS — M47812 Spondylosis without myelopathy or radiculopathy, cervical region: Secondary | ICD-10-CM | POA: Diagnosis not present

## 2022-08-14 DIAGNOSIS — H43821 Vitreomacular adhesion, right eye: Secondary | ICD-10-CM | POA: Diagnosis not present

## 2022-08-14 DIAGNOSIS — H353122 Nonexudative age-related macular degeneration, left eye, intermediate dry stage: Secondary | ICD-10-CM | POA: Diagnosis not present

## 2022-08-14 DIAGNOSIS — H353211 Exudative age-related macular degeneration, right eye, with active choroidal neovascularization: Secondary | ICD-10-CM | POA: Diagnosis not present

## 2022-08-14 DIAGNOSIS — E119 Type 2 diabetes mellitus without complications: Secondary | ICD-10-CM | POA: Diagnosis not present

## 2022-08-14 DIAGNOSIS — H35721 Serous detachment of retinal pigment epithelium, right eye: Secondary | ICD-10-CM | POA: Diagnosis not present

## 2022-08-20 ENCOUNTER — Telehealth: Payer: Self-pay | Admitting: Nurse Practitioner

## 2022-08-20 NOTE — Telephone Encounter (Signed)
Copied from [place] [id number]. Topic: Medicare AWV >> Aug 20, 2022  2:05 PM Devoria Glassing wrote:  Reason for CRM: Left message for patient to schedule Annual Wellness Visit.  Please schedule with Health Nurse Advisor Kirke Shaggy at [facility].Call [place] at [phone]

## 2022-08-27 DIAGNOSIS — G4733 Obstructive sleep apnea (adult) (pediatric): Secondary | ICD-10-CM | POA: Diagnosis not present

## 2022-08-27 DIAGNOSIS — J189 Pneumonia, unspecified organism: Secondary | ICD-10-CM | POA: Diagnosis not present

## 2022-08-27 DIAGNOSIS — I2510 Atherosclerotic heart disease of native coronary artery without angina pectoris: Secondary | ICD-10-CM | POA: Diagnosis not present

## 2022-08-28 ENCOUNTER — Other Ambulatory Visit: Payer: Self-pay | Admitting: Student

## 2022-08-28 DIAGNOSIS — M542 Cervicalgia: Secondary | ICD-10-CM

## 2022-08-28 DIAGNOSIS — Z6828 Body mass index (BMI) 28.0-28.9, adult: Secondary | ICD-10-CM | POA: Diagnosis not present

## 2022-09-01 ENCOUNTER — Telehealth: Payer: PPO

## 2022-09-02 DIAGNOSIS — M47812 Spondylosis without myelopathy or radiculopathy, cervical region: Secondary | ICD-10-CM | POA: Diagnosis not present

## 2022-09-05 ENCOUNTER — Ambulatory Visit
Admission: RE | Admit: 2022-09-05 | Discharge: 2022-09-05 | Disposition: A | Discharge status: Home / Self Care | Payer: PPO | Source: Ambulatory Visit | Attending: Student | Admitting: Student

## 2022-09-05 DIAGNOSIS — M542 Cervicalgia: Secondary | ICD-10-CM

## 2022-09-09 DIAGNOSIS — M542 Cervicalgia: Secondary | ICD-10-CM | POA: Diagnosis not present

## 2022-09-15 ENCOUNTER — Other Ambulatory Visit: Payer: Self-pay | Admitting: Nurse Practitioner

## 2022-09-15 DIAGNOSIS — E119 Type 2 diabetes mellitus without complications: Secondary | ICD-10-CM

## 2022-09-15 NOTE — Telephone Encounter (Signed)
Medication Refill - Medication: ONETOUCH ULTRA test strip   Has the patient contacted their pharmacy? Yes.     Preferred Pharmacy (with phone number or street name):  [facility] [place] [address] Phone: [phone]  Fax: [phone]     Has the patient been seen for an appointment in the last year OR does the patient have an upcoming appointment? Yes.    Please assist patient further

## 2022-09-16 MED ORDER — ONETOUCH ULTRA VI STRP: ORAL_STRIP | 0 refills | Status: DC

## 2022-09-16 NOTE — Telephone Encounter (Signed)
Requested Prescriptions  Pending Prescriptions Disp Refills   glucose blood (ONETOUCH ULTRA) test strip 100 each 0    Sig: USE TO CHECK GLUCOSE TWICE DAILY. USE AS INSTRUCTED     Endocrinology: Diabetes - Testing Supplies Passed - 09/15/2022  5:42 PM      Passed - Valid encounter within last 12 months    Recent Outpatient Visits           1 month ago Persistent atrial fibrillation ([place])   [place], Kimball T, NP   2 months ago Uncontrolled type 2 diabetes mellitus with hyperglycemia, without long-term current use of insulin ([place])   [place], Cheney T, NP   4 months ago Acute sinusitis, recurrence not specified, unspecified location   [place], Erin E, PA-C   4 months ago Upper respiratory tract infection, unspecified type   [facility], Erin E, PA-C   4 months ago Uncontrolled type 2 diabetes mellitus with hyperglycemia, without long-term current use of insulin ([place])   [place], [name], NP       Future Appointments             In 4 weeks [name], [name], NP [place], [facility]

## 2022-09-17 DIAGNOSIS — M47812 Spondylosis without myelopathy or radiculopathy, cervical region: Secondary | ICD-10-CM | POA: Diagnosis not present

## 2022-09-17 DIAGNOSIS — G894 Chronic pain syndrome: Secondary | ICD-10-CM | POA: Diagnosis not present

## 2022-09-17 DIAGNOSIS — Z981 Arthrodesis status: Secondary | ICD-10-CM | POA: Diagnosis not present

## 2022-09-18 ENCOUNTER — Ambulatory Visit: Payer: PPO | Admitting: Internal Medicine

## 2022-09-24 ENCOUNTER — Telehealth: Payer: Self-pay | Admitting: Nurse Practitioner

## 2022-09-24 NOTE — Telephone Encounter (Signed)
Copied from [place] [phone]. Topic: Medicare AWV >> Sep 24, 2022  1:36 PM Devoria Glassing wrote: Reason for CRM: Called patient to schedule Medicare Annual Wellness Visit (AWV). Left message for patient to call back and schedule Medicare Annual Wellness Visit (AWV).  Last date of AWV: 07/24/2021   Please schedule an appointment at any time with Kirke Shaggy, LPN after [id number] on AWV schedule. .  If any questions, please contact me.  Thank you ,  Sherol Dade; [place] Direct Dial: [phone]

## 2022-09-25 ENCOUNTER — Telehealth: Payer: Self-pay | Admitting: Nurse Practitioner

## 2022-09-25 DIAGNOSIS — J189 Pneumonia, unspecified organism: Secondary | ICD-10-CM | POA: Diagnosis not present

## 2022-09-25 DIAGNOSIS — I2510 Atherosclerotic heart disease of native coronary artery without angina pectoris: Secondary | ICD-10-CM | POA: Diagnosis not present

## 2022-09-25 DIAGNOSIS — G4733 Obstructive sleep apnea (adult) (pediatric): Secondary | ICD-10-CM | POA: Diagnosis not present

## 2022-09-25 NOTE — Telephone Encounter (Signed)
Contacted Andrew Cobb to schedule their annual wellness visit. Appointment made for 10/27/2022.  Sherol Dade; Care Guide Ambulatory Clinical Support [facility] Direct Dial: [phone]

## 2022-10-01 ENCOUNTER — Encounter: Payer: Self-pay | Admitting: *Deleted

## 2022-10-01 ENCOUNTER — Telehealth: Payer: Self-pay | Admitting: Adult Health

## 2022-10-01 ENCOUNTER — Telehealth: Payer: Self-pay | Admitting: *Deleted

## 2022-10-01 NOTE — Patient Instructions (Signed)
Visit Information  Thank you for taking time to visit with me today. Please don't hesitate to contact me if I can be of assistance to you before our next scheduled telephone appointment.  Following are the goals we discussed today:  Stay active with water aerobics.  Continue monitoring blood sugars daily.  Maintain diabetic diet and increase water intake.  Our next appointment is by telephone on 5/1  Please call the care guide team at [phone] if you need to cancel or reschedule your appointment.   Please call the Suicide and Crisis Lifeline: 988 call the [place] National Suicide Prevention Lifeline: (931) 031-8709 or TTY: (647) 727-9341 TTY 762-208-1323) to talk to a trained counselor call 1-800-273-TALK (toll free, 24 hour hotline) call 911 if you are experiencing a Mental Health or [place] or need someone to talk to.  Patient verbalizes understanding of instructions and care plan provided today and agrees to view in [place]. Active MyChart status and patient understanding of how to access instructions and care plan via MyChart confirmed with patient.     The patient has been provided with contact information for the care management team and has been advised to call with any health related questions or concerns.   Valente David, RN, MSN, [place] Care Management Care Management Coordinator [phone]

## 2022-10-01 NOTE — Telephone Encounter (Signed)
LVM and sent mychart msg informing pt of need to reschedule 10/20/22 appointment - NP out

## 2022-10-01 NOTE — Patient Outreach (Signed)
  Care Coordination   Initial Visit Note   10/01/2022 Name: Andrew Cobb MRN: [id number] DOB: 06-15-1951  Andrew Cobb is a 72 y.o. year old male who sees Andrew Cobb, Andrew Screws T, NP for primary care. I spoke with  Andrew Cobb and wife by phone today.  What matters to the patients health and wellness today?  Being able to continue to stay active and decrease A1C to goal    Goals Addressed             This Visit's Progress    Effective management of DM       Care Coordination Interventions: Provided education to patient about basic DM disease process Reviewed medications with patient and discussed importance of medication adherence Counseled on importance of regular laboratory monitoring as prescribed Provided patient with written educational materials related to hypo and hyperglycemia and importance of correct treatment Reviewed scheduled/upcoming provider appointments including: cardiology on 4/3, PCP on 4/10, AWV on 4/22, pharmacist on 4/29, and Neuro on 6/25 Advised patient, providing education and rationale, to check cbg daily and record, calling provider for findings outside established parameters Screening for signs and symptoms of depression related to chronic disease state  Assessed social determinant of health barriers Current A1C is 7.8, goal of less than 7.  Blood sugar range 120-135 Discussed increasing activity, state he is active with water aerobics         SDOH assessments and interventions completed:  Yes{THN Tip this will not be part of the note when signed-REQUIRED REPORT FIELD DO NOT DELETE (Optional):27901}  SDOH Interventions Today    Flowsheet Row Most Recent Value  SDOH Interventions   Food Insecurity Interventions Intervention Not Indicated  Housing Interventions Intervention Not Indicated  Transportation Interventions Intervention Not Indicated        Care Coordination Interventions:  Yes, provided {THN Tip this will not be part of the  note when signed-REQUIRED REPORT FIELD DO NOT DELETE (Optional):27901}  Interventions Today    Flowsheet Row Most Recent Value  Chronic Disease   Chronic disease during today's visit Diabetes  General Interventions   General Interventions Discussed/Reviewed General Interventions Reviewed, Labs, Doctor Visits  Labs Hgb A1c every 3 months  Doctor Visits Discussed/Reviewed Doctor Visits Discussed, Annual Wellness Visits, PCP  PCP/Specialist Visits Compliance with follow-up visit  Exercise Interventions   Exercise Discussed/Reviewed Physical Activity  Physical Activity Discussed/Reviewed Gym, Physical Activity Reviewed  Education Interventions   Education Provided Provided Education  Provided Verbal Education On Nutrition, Blood Sugar Monitoring, When to see the doctor  Nutrition Interventions   Nutrition Discussed/Reviewed Nutrition Discussed, Decreasing sugar intake  Pharmacy Interventions   Pharmacy Dicussed/Reviewed Affording Medications, Pharmacy Topics Reviewed        Follow up plan: Follow up call scheduled for 5/1    Encounter Outcome:  Pt. Visit Completed {THN Tip this will not be part of the note when signed-REQUIRED REPORT FIELD DO NOT DELETE (Optional):27901}  Andrew David, RN, MSN, [place] Management Care Management Coordinator [phone]

## 2022-10-08 DIAGNOSIS — R001 Bradycardia, unspecified: Secondary | ICD-10-CM | POA: Diagnosis not present

## 2022-10-08 DIAGNOSIS — Z95818 Presence of other cardiac implants and grafts: Secondary | ICD-10-CM | POA: Diagnosis not present

## 2022-10-08 DIAGNOSIS — I4819 Other persistent atrial fibrillation: Secondary | ICD-10-CM | POA: Diagnosis not present

## 2022-10-09 DIAGNOSIS — H353122 Nonexudative age-related macular degeneration, left eye, intermediate dry stage: Secondary | ICD-10-CM | POA: Diagnosis not present

## 2022-10-09 DIAGNOSIS — E119 Type 2 diabetes mellitus without complications: Secondary | ICD-10-CM | POA: Diagnosis not present

## 2022-10-09 DIAGNOSIS — H353211 Exudative age-related macular degeneration, right eye, with active choroidal neovascularization: Secondary | ICD-10-CM | POA: Diagnosis not present

## 2022-10-09 DIAGNOSIS — H43821 Vitreomacular adhesion, right eye: Secondary | ICD-10-CM | POA: Diagnosis not present

## 2022-10-09 DIAGNOSIS — H35721 Serous detachment of retinal pigment epithelium, right eye: Secondary | ICD-10-CM | POA: Diagnosis not present

## 2022-10-15 ENCOUNTER — Ambulatory Visit: Payer: PPO | Admitting: Nurse Practitioner

## 2022-10-15 DIAGNOSIS — Z8546 Personal history of malignant neoplasm of prostate: Secondary | ICD-10-CM

## 2022-10-15 DIAGNOSIS — E1169 Type 2 diabetes mellitus with other specified complication: Secondary | ICD-10-CM

## 2022-10-15 DIAGNOSIS — E1165 Type 2 diabetes mellitus with hyperglycemia: Secondary | ICD-10-CM

## 2022-10-15 DIAGNOSIS — E1159 Type 2 diabetes mellitus with other circulatory complications: Secondary | ICD-10-CM

## 2022-10-15 DIAGNOSIS — G4733 Obstructive sleep apnea (adult) (pediatric): Secondary | ICD-10-CM

## 2022-10-15 DIAGNOSIS — Z8673 Personal history of transient ischemic attack (TIA), and cerebral infarction without residual deficits: Secondary | ICD-10-CM

## 2022-10-15 DIAGNOSIS — I4819 Other persistent atrial fibrillation: Secondary | ICD-10-CM

## 2022-10-15 DIAGNOSIS — I700 Atherosclerosis of aorta: Secondary | ICD-10-CM

## 2022-10-15 DIAGNOSIS — R569 Unspecified convulsions: Secondary | ICD-10-CM

## 2022-10-15 DIAGNOSIS — D696 Thrombocytopenia, unspecified: Secondary | ICD-10-CM

## 2022-10-15 DIAGNOSIS — D692 Other nonthrombocytopenic purpura: Secondary | ICD-10-CM

## 2022-10-15 DIAGNOSIS — D6869 Other thrombophilia: Secondary | ICD-10-CM

## 2022-10-15 DIAGNOSIS — Z95818 Presence of other cardiac implants and grafts: Secondary | ICD-10-CM

## 2022-10-20 ENCOUNTER — Ambulatory Visit: Payer: PPO | Admitting: Adult Health

## 2022-10-24 DIAGNOSIS — G894 Chronic pain syndrome: Secondary | ICD-10-CM | POA: Diagnosis not present

## 2022-10-24 DIAGNOSIS — M47812 Spondylosis without myelopathy or radiculopathy, cervical region: Secondary | ICD-10-CM | POA: Diagnosis not present

## 2022-10-26 DIAGNOSIS — J189 Pneumonia, unspecified organism: Secondary | ICD-10-CM | POA: Diagnosis not present

## 2022-10-26 DIAGNOSIS — I2510 Atherosclerotic heart disease of native coronary artery without angina pectoris: Secondary | ICD-10-CM | POA: Diagnosis not present

## 2022-10-26 DIAGNOSIS — G4733 Obstructive sleep apnea (adult) (pediatric): Secondary | ICD-10-CM | POA: Diagnosis not present

## 2022-10-27 ENCOUNTER — Telehealth: Payer: PPO

## 2022-10-27 ENCOUNTER — Ambulatory Visit (INDEPENDENT_AMBULATORY_CARE_PROVIDER_SITE_OTHER): Payer: PPO

## 2022-10-27 VITALS — Ht 70.0 in | Wt 203.0 lb

## 2022-10-27 DIAGNOSIS — Z0000 Encounter for general adult medical examination without abnormal findings: Secondary | ICD-10-CM

## 2022-10-27 NOTE — Progress Notes (Signed)
I connected with  Andrew Cobb on 10/27/22 by a audio enabled telemedicine application and verified that I am speaking with the correct person using two identifiers.  Patient Location: Home  Provider Location: Office/Clinic  I discussed the limitations of evaluation and management by telemedicine. The patient expressed understanding and agreed to proceed.  Subjective:   Andrew Cobb is a 72 y.o. male who presents for Medicare Annual/Subsequent preventive examination.  Review of Systems     Cardiac Risk Factors include: advanced age (>23men, >16 women);diabetes mellitus;male gender;hypertension     Objective:    There were no vitals filed for this visit. There is no height or weight on file to calculate BMI.     10/27/2022   11:39 AM 07/14/2022    9:29 AM 03/06/2022    9:38 AM 10/04/2021    8:56 PM 10/04/2021    9:39 AM 07/24/2021   10:35 AM 07/12/2021    8:52 AM  Advanced Directives  Does Patient Have a Medical Advance Directive? Yes No Yes Yes Yes Yes Yes  Type of Estate agent of [place];Living will  Healthcare Power of [place];Living will Healthcare Power of Attorney Out of facility DNR (pink MOST or yellow form);Living will Healthcare Power of [employer] of [place];Living will  Does patient want to make changes to medical advance directive? No - Patient declined No - Patient declined  No - Patient declined   No - Patient declined  Copy of Healthcare Power of Attorney in Chart? Yes - validated most recent copy scanned in chart (See row information)     Yes - validated most recent copy scanned in chart (See row information)   Would patient like information on creating a medical advance directive?  No - Patient declined    No - Patient declined No - Patient declined    Current Medications (verified) Outpatient Encounter Medications as of 10/27/2022  Medication Sig   acyclovir (ZOVIRAX) 400 MG tablet Take 1 tablet (400 mg total) by mouth  2 (two) times daily.   albuterol (VENTOLIN HFA) 108 (90 Base) MCG/ACT inhaler Inhale 2 puffs into the lungs every 6 (six) hours as needed for wheezing or shortness of breath.   amitriptyline (ELAVIL) 25 MG tablet Take 1 tablet (25 mg total) by mouth at bedtime.   aspirin EC 81 MG tablet Take 81 mg by mouth daily.   atorvastatin (LIPITOR) 40 MG tablet Take 1 tablet (40 mg total) by mouth at bedtime.   cetirizine (ZYRTEC) 10 MG tablet Take 10 mg by mouth daily.   Cholecalciferol (VITAMIN D) 2000 units CAPS Take 2,000 Units by mouth daily.   Cyanocobalamin (B-12) 5000 MCG CAPS Take 5,000 mcg by mouth every other day.   empagliflozin (JARDIANCE) 25 MG TABS tablet Take 1 tablet (25 mg total) by mouth daily.   fluticasone (FLONASE) 50 MCG/ACT nasal spray Place 2 sprays into both nostrils daily. (Patient taking differently: Place 2 sprays into both nostrils daily as needed for allergies.)   glucose blood (ONETOUCH ULTRA) test strip USE TO CHECK GLUCOSE TWICE DAILY. USE AS INSTRUCTED   Lancets (ONETOUCH ULTRASOFT) lancets USE TO CHECK BLOOD SUAGR TWICE DAILY   levETIRAcetam (KEPPRA) 500 MG tablet Take 1 tablet (500 mg total) by mouth 2 (two) times daily.   metFORMIN (GLUCOPHAGE) 1000 MG tablet Take 1 tablet (1,000 mg total) by mouth 2 (two) times daily with a meal.   Multiple Vitamins-Minerals (MULTIVITAMIN PO) Take 1 tablet by mouth daily.  tamsulosin (FLOMAX) 0.4 MG CAPS capsule Take 1 capsule (0.4 mg total) by mouth at bedtime.   telmisartan (MICARDIS) 80 MG tablet Take 1 tablet (80 mg total) by mouth daily.   clopidogrel (PLAVIX) 75 MG tablet Take 75 mg by mouth daily. (Patient not taking: Reported on 10/27/2022)   Facility-Administered Encounter Medications as of 10/27/2022  Medication   0.9 %  sodium chloride infusion   0.9 %  sodium chloride infusion    Allergies (verified) Bactrim [sulfamethoxazole-trimethoprim], Succinylsulphathiazole, and Tetracyclines & related   History: Past Medical  History:  Diagnosis Date   Arthritis    Asthma    has not needed since 6/21   Cancer    prostate   Chronic venous insufficiency    varicose vein lower extremity with inflammation   Coronary artery disease 1996   two stents placed    Diabetes mellitus without complication    type 2 on metformin   GERD (gastroesophageal reflux disease)    no issues since gastric bypass surgery as stated per pt   Hyperlipidemia    Hypertension    Hypogonadism in male    MRSA (methicillin resistant Staphylococcus aureus) infection    07/30/2008 thru 08/07/2008  abdominal abcess   Sleep apnea    on BIPAP   Stented coronary artery    Stroke 09/07/2019   no defecits   Thrombocythemia    Past Surgical History:  Procedure Laterality Date   ANGIOPLASTY     ANGIOPLASTY     with stent 04/07/1995   ANTERIOR CERVICAL DECOMPRESSION/DISCECTOMY FUSION 4 LEVELS N/A 08/17/2017   Procedure: Anterior discectomy with fusion and plate fixation Cervical Three-Four, Four-Five, Five-Six, and Six-Seven Fusion;  Surgeon: Ditty, Loura Halt, MD;  Location: [facility] OR;  Service: Neurosurgery;  Laterality: N/A;  Anterior discectomy with fusion and plate fixation Cervical Three-Four, Four-Five, Five-Six, and Six-Seven Fusion    APPENDECTOMY     1966   BUBBLE STUDY  09/20/2018   Procedure: BUBBLE STUDY;  Surgeon: Wendall Stade, MD;  Location: [facility] ENDOSCOPY;  Service: Cardiovascular;;   CARDIAC CATHETERIZATION  1996   CARDIOVASCULAR STRESS TEST     07/31/2011   CARDIOVERSION N/A 07/04/2021   Procedure: CARDIOVERSION;  Surgeon: Chrystie Nose, MD;  Location: [facility] ENDOSCOPY;  Service: Cardiovascular;  Laterality: N/A;   CARPAL TUNNEL RELEASE Left    CHOLECYSTECTOMY     2006   COLONOSCOPY WITH PROPOFOL N/A 03/26/2021   Procedure: COLONOSCOPY WITH PROPOFOL;  Surgeon: Wyline Mood, MD;  Location: [facility] ENDOSCOPY;  Service: Gastroenterology;  Laterality: N/A;   colonscopy      08/25/2012   EYE SURGERY Bilateral    cataract    FUNCTIONAL ENDOSCOPIC SINUS SURGERY     11/10/2013   GASTRIC BYPASS     10/05/2012   HERNIA REPAIR     left inguinal 1981   INCISION AND DRAINAGE ABSCESS Right 02/26/2017   Procedure: INCISION AND DRAINAGE ABSCESS;  Surgeon: Hildred Laser, MD;  Location: [facility] ORS;  Service: Urology;  Laterality: Right;   JOINT REPLACEMENT     bilateral   left ankle surgery      05/03/2003    LEFT ATRIAL APPENDAGE OCCLUSION N/A 03/06/2022   Procedure: LEFT ATRIAL APPENDAGE OCCLUSION;  Surgeon: Lanier Prude, MD;  Location: [facility] INVASIVE CV LAB;  Service: Cardiovascular;  Laterality: N/A;   left carpel tunnel      09/18/1993   left knee meniscal tear      01/25/2010   left knee meniscal tear  repair      05/04/1996   left rotator cuff repair      05/03/2003    LOOP RECORDER INSERTION N/A 09/20/2018   Procedure: LOOP RECORDER INSERTION;  Surgeon: Marinus Maw, MD;  Location: [facility] INVASIVE CV LAB;  Service: Cardiovascular;  Laterality: N/A;   REPLACEMENT TOTAL KNEE BILATERAL  07/13/2015   REVERSE SHOULDER ARTHROPLASTY Left 08/30/2020   Procedure: REVERSE SHOULDER ARTHROPLASTY;  Surgeon: Francena Hanly, MD;  Location: [facility] ORS;  Service: Orthopedics;  Laterality: Left;    right ankle surgery      fracture has 2 screws 07/07/1997   right carpel tunnel      05/16/1992   right shoulder replacement      01/27/2006   SCROTAL EXPLORATION Right 02/26/2017   Procedure: SCROTUM EXPLORATION;  Surgeon: Hildred Laser, MD;  Location: [facility] ORS;  Service: Urology;  Laterality: Right;   TEE WITHOUT CARDIOVERSION N/A 09/20/2018   Procedure: TRANSESOPHAGEAL ECHOCARDIOGRAM (TEE);  Surgeon: Wendall Stade, MD;  Location: [facility] ENDOSCOPY;  Service: Cardiovascular;  Laterality: N/A;   TEE WITHOUT CARDIOVERSION N/A 03/06/2022   Procedure: TRANSESOPHAGEAL ECHOCARDIOGRAM (TEE);  Surgeon: Lanier Prude, MD;  Location: [facility] INVASIVE CV LAB;  Service: Cardiovascular;  Laterality: N/A;   TOTAL KNEE ARTHROPLASTY Left  07/13/2015   Procedure: LEFT TOTAL KNEE ARTHROPLASTY;  Surgeon: Ollen Gross, MD;  Location: [facility] ORS;  Service: Orthopedics;  Laterality: Left;   Family History  Problem Relation Age of Onset   Cancer Mother        pancreatic   Diabetes Mother    Stroke Mother    Heart disease Mother    Hyperlipidemia Mother    Hypertension Mother    Heart attack Mother    Heart disease Father    Stroke Father    Diabetes Father    Hypertension Father    Heart attack Father    Hyperlipidemia Father    Pancreatic cancer Father    Cancer Sister    Cancer Brother        lung   Cancer Brother    Kidney cancer Neg Hx    Bladder Cancer Neg Hx    Prostate cancer Neg Hx    Social History   Socioeconomic History   Marital status: Married    Spouse name: Not on file   Number of children: Not on file   Years of education: Not on file   Highest education level: Some college, no degree  Occupational History    Comment: drives for [employer]   Tobacco Use   Smoking status: Former    Packs/day: 1.00    Years: 10.00    Additional pack years: 0.00    Total pack years: 10.00    Types: Cigarettes    Quit date: 07/07/1984    Years since quitting: 38.3   Smokeless tobacco: Never   Tobacco comments:    Former Smoker quit 1986 07/23/2021  Vaping Use   Vaping Use: Never used  Substance and Sexual Activity   Alcohol use: No    Alcohol/week: 0.0 standard drinks of alcohol   Drug use: No   Sexual activity: Yes  Other Topics Concern   Not on file  Social History Narrative   Not on file   Social Determinants of Health   Financial Resource Strain: Low Risk  (10/27/2022)   Overall Financial Resource Strain (CARDIA)    Difficulty of Paying Living Expenses: Not hard at all  Food Insecurity: No Food Insecurity (10/27/2022)  Hunger Vital Sign    Worried About Running Out of Food in the Last Year: Never true    Ran Out of Food in the Last Year: Never true  Transportation Needs: No Transportation Needs  (10/27/2022)   PRAPARE - Administrator, Civil Service (Medical): No    Lack of Transportation (Non-Medical): No  Physical Activity: Sufficiently Active (10/27/2022)   Exercise Vital Sign    Days of Exercise per Week: 5 days    Minutes of Exercise per Session: 60 min  Stress: No Stress Concern Present (10/27/2022)   [employer] of Occupational Health - Occupational Stress Questionnaire    Feeling of Stress : Not at all  Social Connections: Moderately Integrated (10/27/2022)   Social Connection and Isolation Panel [NHANES]    Frequency of Communication with Friends and Family: Three times a week    Frequency of Social Gatherings with Friends and Family: Never    Attends Religious Services: More than 4 times per year    Active Member of [employer] or Organizations: No    Attends Banker Meetings: Never    Marital Status: Married    Tobacco Counseling Counseling given: Not Answered Tobacco comments: Former Smoker quit 1986 07/23/2021   Clinical Intake:  Pre-visit preparation completed: Yes  Pain : No/denies pain     Nutritional Risks: None Diabetes: Yes CBG done?: No Did pt. bring in CBG monitor from home?: No  How often do you need to have someone help you when you read instructions, pamphlets, or other written materials from your doctor or pharmacy?: 1 - Never  Diabetic?YES Nutrition Risk Assessment:  Has the patient had any N/V/D within the last 2 months?  No  Does the patient have any non-healing wounds?  No  Has the patient had any unintentional weight loss or weight gain?  No   Diabetes:  Is the patient diabetic?  Yes  If diabetic, was a CBG obtained today?  No  Did the patient bring in their glucometer from home?  No  How often do you monitor your CBG's? EVERY MORNING.   Financial Strains and Diabetes Management:  Are you having any financial strains with the device, your supplies or your medication? No .  Does the patient want to be  seen by Chronic Care Management for management of their diabetes?  No  Would the patient like to be referred to a Nutritionist or for Diabetic Management?  No   Diabetic Exams:  Diabetic Eye Exam: Completed 03/13/22. [name] Kitchen Pt has been advised about the importance in completing this exam.  Diabetic Foot Exam: Completed 10/18/21. Pt has been advised about the importance in completing this exam.   Interpreter Needed?: No  Information entered by :: Kennedy Bucker, LPN   Activities of Daily Living    10/27/2022   11:40 AM 03/06/2022    9:50 AM  In your present state of health, do you have any difficulty performing the following activities:  Hearing? 0   Vision? 0   Difficulty concentrating or making decisions? 0   Walking or climbing stairs? 0   Dressing or bathing? 0   Doing errands, shopping? 0 0  Preparing Food and eating ? N   Using the Toilet? N   In the past six months, have you accidently leaked urine? N   Do you have problems with loss of bowel control? N   Managing your Medications? N   Managing your Finances? N   Housekeeping or managing your  Housekeeping? N     Patient Care Team: Marjie Skiff, NP as PCP - General (Nurse Practitioner) Marinus Maw, MD as PCP - Electrophysiology (Cardiology) Carmina Miller, MD as Radiation Oncologist (Radiation Oncology) Dahlia Byes, [facility] (Inactive) as Pharmacist (Pharmacist) [name], RN as [facility] Care Management  Indicate any recent Medical Services you may have received from other than [facility] providers in the past year (date may be approximate).     Assessment:   This is a routine wellness examination for Andrew Cobb.  Hearing/Vision screen Hearing Screening - Comments:: NO AIDS Vision Screening - Comments:: READERS- DR.GROAT  Dietary issues and exercise activities discussed: Current Exercise Habits: Home exercise routine;Structured exercise class, Type of exercise: strength training/weights, Time (Minutes):  60, Frequency (Times/Week): 5, Weekly Exercise (Minutes/Week): 300, Intensity: Moderate   Goals Addressed             This Visit's Progress    DIET - EAT MORE FRUITS AND VEGETABLES         Depression Screen    10/27/2022   11:37 AM 11/01/2021    9:11 AM 07/24/2021   10:42 AM 05/06/2021    2:10 PM 07/20/2020   10:34 AM 07/02/2020    8:46 AM 10/18/2019    8:55 AM  PHQ 2/9 Scores  PHQ - 2 Score 0 0 0 0 0 0 0  PHQ- 9 Score 0 0  0   0    Fall Risk    10/27/2022   11:40 AM 11/01/2021    9:11 AM 07/24/2021   10:34 AM 05/06/2021    2:10 PM 07/20/2020   10:33 AM  Fall Risk   Falls in the past year? 0 1 0 0 1  Comment     tripped  Number falls in past yr: 0 1 0 0 0  Injury with Fall? 0 1 0 0 0  Risk for fall due to : No Fall Risks History of fall(s)  No Fall Risks History of fall(s);Medication side effect  Follow up Falls prevention discussed;Falls evaluation completed Falls evaluation completed Falls evaluation completed;Falls prevention discussed Falls evaluation completed Falls evaluation completed;Education provided;Falls prevention discussed    FALL RISK PREVENTION PERTAINING TO THE HOME:  Any stairs in or around the home? No  If so, are there any without handrails? No  Home free of loose throw rugs in walkways, pet beds, electrical cords, etc? Yes  Adequate lighting in your home to reduce risk of falls? Yes   ASSISTIVE DEVICES UTILIZED TO PREVENT FALLS:  Life alert? No  Use of a cane, walker or w/c? No  Grab bars in the bathroom? Yes  Shower chair or bench in shower? No  Elevated toilet seat or a handicapped toilet? No   Cognitive Function:        10/27/2022   11:46 AM 07/20/2020   10:35 AM 04/22/2018    9:10 AM 04/20/2017    8:14 AM  6CIT Screen  What Year? 0 points 0 points 0 points 0 points  What month? 0 points 0 points 0 points 0 points  What time? 0 points 0 points 0 points 0 points  Count back from 20 0 points 0 points 0 points 0 points  Months in  reverse 0 points 4 points 0 points   Repeat phrase 0 points 6 points 0 points 0 points  Total Score 0 points 10 points 0 points     Immunizations Immunization History  Administered Date(s) Administered   Fluad Quad(high Dose  65+) 03/16/2019, 03/20/2020, 04/22/2021, 04/15/2022   Influenza Whole 04/09/2009   Influenza, High Dose Seasonal PF 04/06/2017, 04/22/2018   Influenza,inj,Quad PF,6+ Mos 04/24/2015   Influenza,inj,quad, With Preservative 04/06/2020   Influenza-Unspecified 03/07/2014, 04/06/2017   Moderna Sars-Covid-2 Vaccination 12/19/2019, 01/16/2020   Pneumococcal Conjugate-13 03/20/2020   Pneumococcal Polysaccharide-23 05/07/2021   Td 03/12/2005   Tdap 07/17/2016   Zoster, Live 07/17/2016    TDAP status: Up to date  Flu Vaccine status: Up to date  Pneumococcal vaccine status: Up to date  Covid-19 vaccine status: Completed vaccines  Qualifies for Shingles Vaccine? Yes   Zostavax completed Yes   Shingrix Completed?: No.    Education has been provided regarding the importance of this vaccine. Patient has been advised to call insurance company to determine out of pocket expense if they have not yet received this vaccine. Advised may also receive vaccine at local pharmacy or Health Dept. Verbalized acceptance and understanding.  Screening Tests Health Maintenance  Topic Date Due   FOOT EXAM  10/19/2022   COVID-19 Vaccine (3 - Moderna risk series) 10/28/2022 (Originally 02/13/2020)   Zoster Vaccines- Shingrix (1 of 2) 01/11/2023 (Originally 01/20/1970)   HEMOGLOBIN A1C  01/14/2023   INFLUENZA VACCINE  02/05/2023   OPHTHALMOLOGY EXAM  03/14/2023   Diabetic kidney evaluation - eGFR measurement  07/17/2023   Diabetic kidney evaluation - Urine ACR  07/17/2023   Medicare Annual Wellness (AWV)  10/27/2023   DTaP/Tdap/Td (3 - Td or Tdap) 07/17/2026   COLONOSCOPY (Pts 45-21yrs Insurance coverage will need to be confirmed)  03/26/2028   Pneumonia Vaccine 10+ Years old  Completed    Hepatitis C Screening  Completed   HPV VACCINES  Aged Out    Health Maintenance  Health Maintenance Due  Topic Date Due   FOOT EXAM  10/19/2022    Colorectal cancer screening: Type of screening: Colonoscopy. Completed 03/26/21. Repeat every 7 years  Lung Cancer Screening: (Low Dose CT Chest recommended if Age 82-80 years, 30 pack-year currently smoking OR have quit w/in 15years.) does not qualify.    Additional Screening:  Hepatitis C Screening: does qualify; Completed 07/17/16  Vision Screening: Recommended annual ophthalmology exams for early detection of glaucoma and other disorders of the eye. Is the patient up to date with their annual eye exam?  Yes  Who is the provider or what is the name of the office in which the patient attends annual eye exams?DR Dione Booze If pt is not established with a provider, would they like to be referred to a provider to establish care? No .   Dental Screening: Recommended annual dental exams for proper oral hygiene  Community Resource Referral / Chronic Care Management: CRR required this visit?  No   CCM required this visit?  No      Plan:     I have personally reviewed and noted the following in the patient's chart:   Medical and social history Use of alcohol, tobacco or illicit drugs  Current medications and supplements including opioid prescriptions. Patient is not currently taking opioid prescriptions. Functional ability and status Nutritional status Physical activity Advanced directives List of other physicians Hospitalizations, surgeries, and ER visits in previous 12 months Vitals Screenings to include cognitive, depression, and falls Referrals and appointments  In addition, I have reviewed and discussed with patient certain preventive protocols, quality metrics, and best practice recommendations. A written personalized care plan for preventive services as well as general preventive health recommendations were provided to  patient.  Hal Hope, LPN   02/14/9146   Nurse Notes: [facility]

## 2022-10-30 DIAGNOSIS — I2510 Atherosclerotic heart disease of native coronary artery without angina pectoris: Secondary | ICD-10-CM | POA: Diagnosis not present

## 2022-10-30 DIAGNOSIS — Z95818 Presence of other cardiac implants and grafts: Secondary | ICD-10-CM | POA: Diagnosis not present

## 2022-10-30 DIAGNOSIS — I4819 Other persistent atrial fibrillation: Secondary | ICD-10-CM | POA: Diagnosis not present

## 2022-11-02 NOTE — Patient Instructions (Signed)
Diabetes Mellitus Basics  Diabetes mellitus, or diabetes, is a long-term (chronic) disease. It occurs when the body does not properly use sugar (glucose) that is released from food after you eat. Diabetes mellitus may be caused by one or both of these problems: Your pancreas does not make enough of a hormone called insulin. Your body does not react in a normal way to the insulin that it makes. Insulin lets glucose enter cells in your body. This gives you energy. If you have diabetes, glucose cannot get into cells. This causes high blood glucose (hyperglycemia). How to treat and manage diabetes You may need to take insulin or other diabetes medicines daily to keep your glucose in balance. If you are prescribed insulin, you will learn how to give yourself insulin by injection. You may need to adjust the amount of insulin you take based on the foods that you eat. You will need to check your blood glucose levels using a glucose monitor as told by your health care provider. The readings can help determine if you have low or high blood glucose. Generally, you should have these blood glucose levels: Before meals (preprandial): 80-130 mg/dL (4.4-7.2 mmol/L). After meals (postprandial): below 180 mg/dL (10 mmol/L). Hemoglobin A1c (HbA1c) level: less than 7%. Your health care provider will set treatment goals for you. Keep all follow-up visits. This is important. Follow these instructions at home: Diabetes medicines Take your diabetes medicines every day as told by your health care provider. List your diabetes medicines here: Name of medicine: ______________________________ Amount (dose): _______________ Time (a.m./p.m.): _______________ Notes: ___________________________________ Name of medicine: ______________________________ Amount (dose): _______________ Time (a.m./p.m.): _______________ Notes: ___________________________________ Name of medicine: ______________________________ Amount (dose):  _______________ Time (a.m./p.m.): _______________ Notes: ___________________________________ Insulin If you use insulin, list the types of insulin you use here: Insulin type: ______________________________ Amount (dose): _______________ Time (a.m./p.m.): _______________Notes: ___________________________________ Insulin type: ______________________________ Amount (dose): _______________ Time (a.m./p.m.): _______________ Notes: ___________________________________ Insulin type: ______________________________ Amount (dose): _______________ Time (a.m./p.m.): _______________ Notes: ___________________________________ Insulin type: ______________________________ Amount (dose): _______________ Time (a.m./p.m.): _______________ Notes: ___________________________________ Insulin type: ______________________________ Amount (dose): _______________ Time (a.m./p.m.): _______________ Notes: ___________________________________ Managing blood glucose  Check your blood glucose levels using a glucose monitor as told by your health care provider. Write down the times that you check your glucose levels here: Time: _______________ Notes: ___________________________________ Time: _______________ Notes: ___________________________________ Time: _______________ Notes: ___________________________________ Time: _______________ Notes: ___________________________________ Time: _______________ Notes: ___________________________________ Time: _______________ Notes: ___________________________________  Low blood glucose Low blood glucose (hypoglycemia) is when glucose is at or below 70 mg/dL (3.9 mmol/L). Symptoms may include: Feeling: Hungry. Sweaty and clammy. Irritable or easily upset. Dizzy. Sleepy. Having: A fast heartbeat. A headache. A change in your vision. Numbness around the mouth, lips, or tongue. Having trouble with: Moving (coordination). Sleeping. Treating low blood glucose To treat low blood  glucose, eat or drink something containing sugar right away. If you can think clearly and swallow safely, follow the 15:15 rule: Take 15 grams of a fast-acting carb (carbohydrate), as told by your health care provider. Some fast-acting carbs are: Glucose tablets: take 3-4 tablets. Hard candy: eat 3-5 pieces. Fruit juice: drink 4 oz (120 mL). Regular (not diet) soda: drink 4-6 oz (120-180 mL). Honey or sugar: eat 1 Tbsp (15 mL). Check your blood glucose levels 15 minutes after you take the carb. If your glucose is still at or below 70 mg/dL (3.9 mmol/L), take 15 grams of a carb again. If your glucose does not go above 70 mg/dL (3.9 mmol/L) after   3 tries, get help right away. After your glucose goes back to normal, eat a meal or a snack within 1 hour. Treating very low blood glucose If your glucose is at or below 54 mg/dL (3 mmol/L), you have very low blood glucose (severe hypoglycemia). This is an emergency. Do not wait to see if the symptoms will go away. Get medical help right away. Call your local emergency services (911 in the [place] Do not drive yourself to the hospital. Questions to ask your health care provider Should I talk with a diabetes educator? What equipment will I need to care for myself at home? What diabetes medicines do I need? When should I take them? How often do I need to check my blood glucose levels? What number can I call if I have questions? When is my follow-up visit? Where can I find a support group for people with diabetes? Where to find more information American Diabetes Association: www.diabetes.org Association of Diabetes Care and Education Specialists: www.diabeteseducator.org Contact a health care provider if: Your blood glucose is at or above 240 mg/dL (13.3 mmol/L) for 2 days in a row. You have been sick or have had a fever for 2 days or more, and you are not getting better. You have any of these problems for more than 6 hours: You cannot eat or  drink. You feel nauseous. You vomit. You have diarrhea. Get help right away if: Your blood glucose is lower than 54 mg/dL (3 mmol/L). You get confused. You have trouble thinking clearly. You have trouble breathing. These symptoms may represent a serious problem that is an emergency. Do not wait to see if the symptoms will go away. Get medical help right away. Call your local emergency services (911 in the [place] Do not drive yourself to the hospital. Summary Diabetes mellitus is a chronic disease that occurs when the body does not properly use sugar (glucose) that is released from food after you eat. Take insulin and diabetes medicines as told. Check your blood glucose every day, as often as told. Keep all follow-up visits. This is important. This information is not intended to replace advice given to you by your health care provider. Make sure you discuss any questions you have with your health care provider. Document Revised: 10/25/2019 Document Reviewed: 10/25/2019 Elsevier Patient Education  2023 Elsevier Inc.  

## 2022-11-03 ENCOUNTER — Other Ambulatory Visit: Payer: PPO

## 2022-11-03 ENCOUNTER — Other Ambulatory Visit: Payer: Self-pay | Admitting: Nurse Practitioner

## 2022-11-03 ENCOUNTER — Ambulatory Visit: Payer: PPO

## 2022-11-03 ENCOUNTER — Encounter: Payer: Self-pay | Admitting: Nurse Practitioner

## 2022-11-03 ENCOUNTER — Ambulatory Visit (INDEPENDENT_AMBULATORY_CARE_PROVIDER_SITE_OTHER): Payer: PPO | Admitting: Nurse Practitioner

## 2022-11-03 VITALS — BP 126/75 | HR 66 | Temp 97.6°F | Ht 70.0 in | Wt 206.1 lb

## 2022-11-03 DIAGNOSIS — E1169 Type 2 diabetes mellitus with other specified complication: Secondary | ICD-10-CM | POA: Diagnosis not present

## 2022-11-03 DIAGNOSIS — H353211 Exudative age-related macular degeneration, right eye, with active choroidal neovascularization: Secondary | ICD-10-CM | POA: Diagnosis not present

## 2022-11-03 DIAGNOSIS — Z8546 Personal history of malignant neoplasm of prostate: Secondary | ICD-10-CM | POA: Diagnosis not present

## 2022-11-03 DIAGNOSIS — E1165 Type 2 diabetes mellitus with hyperglycemia: Secondary | ICD-10-CM

## 2022-11-03 DIAGNOSIS — Z95818 Presence of other cardiac implants and grafts: Secondary | ICD-10-CM

## 2022-11-03 DIAGNOSIS — J4520 Mild intermittent asthma, uncomplicated: Secondary | ICD-10-CM

## 2022-11-03 DIAGNOSIS — E785 Hyperlipidemia, unspecified: Secondary | ICD-10-CM

## 2022-11-03 DIAGNOSIS — D696 Thrombocytopenia, unspecified: Secondary | ICD-10-CM

## 2022-11-03 DIAGNOSIS — E1159 Type 2 diabetes mellitus with other circulatory complications: Secondary | ICD-10-CM

## 2022-11-03 DIAGNOSIS — I700 Atherosclerosis of aorta: Secondary | ICD-10-CM

## 2022-11-03 DIAGNOSIS — Z8673 Personal history of transient ischemic attack (TIA), and cerebral infarction without residual deficits: Secondary | ICD-10-CM | POA: Diagnosis not present

## 2022-11-03 DIAGNOSIS — D692 Other nonthrombocytopenic purpura: Secondary | ICD-10-CM | POA: Diagnosis not present

## 2022-11-03 DIAGNOSIS — I4819 Other persistent atrial fibrillation: Secondary | ICD-10-CM

## 2022-11-03 DIAGNOSIS — I152 Hypertension secondary to endocrine disorders: Secondary | ICD-10-CM

## 2022-11-03 DIAGNOSIS — G4733 Obstructive sleep apnea (adult) (pediatric): Secondary | ICD-10-CM

## 2022-11-03 DIAGNOSIS — R569 Unspecified convulsions: Secondary | ICD-10-CM

## 2022-11-03 DIAGNOSIS — D6869 Other thrombophilia: Secondary | ICD-10-CM

## 2022-11-03 LAB — BAYER DCA HB A1C WAIVED: HB A1C (BAYER DCA - WAIVED): 7.4 % — ABNORMAL HIGH (ref 4.8–5.6)

## 2022-11-03 MED ORDER — CLOBETASOL PROPIONATE 0.05 % EX CREA: 1.0000 | TOPICAL_CREAM | Freq: Two times a day (BID) | CUTANEOUS | 2 refills | Status: AC

## 2022-11-03 NOTE — Assessment & Plan Note (Signed)
Chronic, ongoing.  Stable at this time.  Continue current medication regimen and collaboration with pulmonary as needed. 

## 2022-11-03 NOTE — Assessment & Plan Note (Signed)
Chronic, ongoing, followed by opthalmology.  Continue this collaboration. 

## 2022-11-03 NOTE — Assessment & Plan Note (Signed)
Chronic, ongoing.  Continue tight lipid control, may need to increase Atorvastatin to 80 MG if ever elevation, recent LDL below goal for stroke prevention.  Continue current Atorvastatin dosing.  Lipid check today. 

## 2022-11-03 NOTE — Assessment & Plan Note (Signed)
Currently no treatment, continue collaboration with oncology and urology.  Recent notes reviewed. 

## 2022-11-03 NOTE — Assessment & Plan Note (Addendum)
Continue collaboration with neurology due to significant stroke history.  Continue current medication regimen.  Discussed stroke prevention goals BP <130/80, LDL <55, A1c <6.5%.

## 2022-11-03 NOTE — Assessment & Plan Note (Signed)
Chronic.  Noted on imaging 07/11/20 and previous imaging.  Recommend continue daily statin therapy and ASA for prevention. 

## 2022-11-03 NOTE — Assessment & Plan Note (Signed)
Chronic, stable.  Noted on past labs, check CBC next visit.

## 2022-11-03 NOTE — Progress Notes (Signed)
BP 126/75   Pulse 66   Temp 97.6 F (36.4 C) (Oral)   Ht 5\' 10"  (1.778 m)   Wt 206 lb 1.6 oz (93.5 kg)   SpO2 96%   BMI 29.57 kg/m    Subjective:    Patient ID: Andrew Cobb, male    DOB: 1951/02/25, 72 y.o.   MRN: [id number]  HPI: Andrew Cobb is a 72 y.o. male  Chief Complaint  Patient presents with   Diabetes   Hypertension   Hyperlipidemia   Asthma   Sleep Apnea   Prostate Cancer   H/O CVA   DIABETES A1c 7.8% in January.  He continues on Metformin 1000 MG BID and Jardiance 25 MG -- gets assistance with Jardiance from [facility]CM. No recent steroid shots.  Needs to start walking again and go to the [facility], this helps his sugars. History of bariatric surgery > 10 years ago. Is very active at baseline, gets outside frequently.  Continues daily Vit D supplement.   Followed by ophthalmology for macular degeneration and gets injections every 8 weeks. Hypoglycemic episodes:no Polydipsia/polyuria: no Visual disturbance: no Chest pain: no Paresthesias: no Glucose Monitoring: yes  Accucheck frequency: Daily  Fasting glucose: 115 to 135  Post prandial:  Evening:  Before meals: Taking Insulin?: no  Long acting insulin:  Short acting insulin: Blood Pressure Monitoring: daily Retinal Examination: Up to Date continues to go for injections Foot Exam: Up to Date Pneumovax: Up to Date Influenza: Up to Date Aspirin: no   HYPERTENSION / HYPERLIPIDEMIA Taking Telmisartan 80 MG daily and Atorvastatin 40 MG daily. Had aortic atherosclerosis on imaging 07/11/20.  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  Follow-up with cardiology 10/08/22.  Follows with neurology due to history of stroke events x 3, last saw neurology 03/25/22. History of loop recorder which was removed > 1 year ago.  CVA caused some mild expressive aphasia.  Takes Keppra for history of seizure like activity post CVAs + Amitriptyline for headaches post CVA.    Continues to use CPAP nightly, 100%  of the time -- has had for > 30 years. Satisfied with current treatment? yes Duration of hypertension: chronic BP monitoring frequency: daily BP range: <130/80 range on average BP medication side effects: no Duration of hyperlipidemia: chronic Cholesterol medication side effects: no Cholesterol supplements: none Medication compliance: good compliance Aspirin: no Recent stressors: no Recurrent headaches: no Visual changes: no Palpitations: no Dyspnea: no Chest pain: no Lower extremity edema: no Dizzy/lightheaded: no  The ASCVD Risk score (Arnett DK, et al., 2019) failed to calculate for the following reasons:   The patient has a prior MI or stroke diagnosis  ATRIAL FIBRILLATION Followed by cardiology.   Has history of prostate cancer years ago and follow-up with urology was 07/14/22. Atrial fibrillation status: stable Satisfied with current treatment: yes  Medication side effects:  no Medication compliance: good compliance Etiology of atrial fibrillation:  Palpitations:  no Chest pain:  no Dyspnea on exertion:  no Orthopnea:  no Syncope:  no Edema:  no Ventricular rate control: Not indicated Anti-coagulation: not indicated   ASTHMA Asthma status: stable Satisfied with current treatment?: yes Albuterol/rescue inhaler frequency: every other day, often at night Dyspnea frequency: none Wheezing frequency: none Cough frequency: none Nocturnal symptom frequency: none Limitation of activity: no Current upper respiratory symptoms: no Failed/intolerant to following asthma meds:  Asthma meds in past: as above Aerochamber/spacer use: no Visits to ER or Urgent Care in past year: no Pneumovax:  Up to Date Influenza: Up to Date   Relevant past medical, surgical, family and social history reviewed and updated as indicated. Interim medical history since our last visit reviewed. Allergies and medications reviewed and updated.  Review of Systems  Constitutional:  Negative for  activity change, diaphoresis, fatigue and fever.  Respiratory:  Negative for cough, chest tightness, shortness of breath and wheezing.   Cardiovascular:  Negative for chest pain, palpitations and leg swelling.  Gastrointestinal: Negative.   Neurological: Negative.   Psychiatric/Behavioral: Negative.      Per HPI unless specifically indicated above     Objective:    BP 126/75   Pulse 66   Temp 97.6 F (36.4 C) (Oral)   Ht 5\' 10"  (1.778 m)   Wt 206 lb 1.6 oz (93.5 kg)   SpO2 96%   BMI 29.57 kg/m   Wt Readings from Last 3 Encounters:  11/03/22 206 lb 1.6 oz (93.5 kg)  10/27/22 203 lb (92.1 kg)  08/05/22 203 lb (92.1 kg)    Physical Exam Vitals and nursing note reviewed.  Constitutional:      General: He is awake. He is not in acute distress.    Appearance: He is well-developed and well-groomed. He is not ill-appearing.  HENT:     Head: Normocephalic and atraumatic.     Right Ear: Hearing normal. No drainage.     Left Ear: Hearing normal. No drainage.  Eyes:     General: Lids are normal.        Right eye: No discharge.        Left eye: No discharge.     Conjunctiva/sclera: Conjunctivae normal.     Pupils: Pupils are equal, round, and reactive to light.  Neck:     Thyroid: No thyromegaly.     Vascular: No carotid bruit or JVD.     Trachea: Trachea normal.  Cardiovascular:     Rate and Rhythm: Normal rate and regular rhythm.     Heart sounds: Normal heart sounds, S1 normal and S2 normal. No murmur heard.    No gallop.  Pulmonary:     Effort: Pulmonary effort is normal. No accessory muscle usage or respiratory distress.     Breath sounds: Normal breath sounds.  Abdominal:     General: Bowel sounds are normal. There is no distension.     Palpations: Abdomen is soft.     Tenderness: There is no abdominal tenderness.  Musculoskeletal:        General: Normal range of motion.     Cervical back: Normal range of motion and neck supple.     Right lower leg: No edema.      Left lower leg: No edema.  Skin:    General: Skin is warm and dry.     Capillary Refill: Capillary refill takes less than 2 seconds.     Findings: Bruising present.     Comments: Bilateral bruises scattered to upper extremities.  Neurological:     Mental Status: He is alert and oriented to person, place, and time.     Deep Tendon Reflexes: Reflexes are normal and symmetric.  Psychiatric:        Attention and Perception: Attention normal.        Mood and Affect: Mood normal.        Speech: Speech normal.        Behavior: Behavior normal. Behavior is cooperative.        Thought Content: Thought content normal.  Diabetic Foot Exam - Simple   Simple Foot Form Visual Inspection No deformities, no ulcerations, no other skin breakdown bilaterally: Yes Sensation Testing Intact to touch and monofilament testing bilaterally: Yes Pulse Check Posterior Tibialis and Dorsalis pulse intact bilaterally: Yes Comments     Results for orders placed or performed in visit on 11/03/22  Bayer DCA Hb A1c Waived  Result Value Ref Range   HB A1C (BAYER DCA - WAIVED) 7.4 (H) 4.8 - 5.6 %      Assessment & Plan:   Problem List Items Addressed This Visit       Cardiovascular and Mediastinum   Aortic atherosclerosis (HCC)    Chronic.  Noted on imaging 07/11/20 and previous imaging.  Recommend continue daily statin therapy and ASA for prevention.      Exudative age-related macular degeneration of right eye with active choroidal neovascularization (HCC)    Chronic, ongoing, followed by opthalmology.  Continue this collaboration.      Hypertension associated with diabetes (HCC)    Chronic, ongoing with BP below neuro goal <130/80 in office and at home.  Recommend he monitor BP at home daily and document.  Will continue Telmisartan 80 MG daily only.  Continue focus on DASH diet and regular exercise at home.  LABS: CMP.  Continue collaboration with cardiology.  Recommend he wear compression hose at  home to avoid syncopal episodes and orthostatic BP.        Persistent atrial fibrillation (HCC)    Chronic, ongoing with cardioversion on 07/04/21 + Amulet procedure 07/18/22.  Continue collaboration with cardiology.  At this time continue ASA and Plavix.      Senile purpura (HCC)    Chronic.  Noted on exam, continue to monitor and recommend gentle skin care at home + notify provider if skin breakdown presents.        Respiratory   Asthma    Chronic, ongoing.  Stable at this time.  Continue current medication regimen and collaboration with pulmonary as needed.      OSA (obstructive sleep apnea)    Chronic, stable with 100% use of CPAP.  Praised for this.        Endocrine   Hyperlipidemia associated with type 2 diabetes mellitus (HCC)    Chronic, ongoing.  Continue tight lipid control, may need to increase Atorvastatin to 80 MG if ever elevation, recent LDL below goal for stroke prevention.  Continue current Atorvastatin dosing.  Lipid check today.      Uncontrolled type 2 diabetes mellitus with hyperglycemia, without long-term current use of insulin (HCC) - Primary    Chronic, ongoing with A1c 7.4% today, trending back down.  Urine ALB 80 January 2024.  Continue Telmisartan for kidney protection. Neuro goal <6.5%, recommend he take medication daily and focus heavily on diet -- as often at goal.  Continue  Metformin 1000 MG BID and Jardiance 25 MG - obtains via assistance (new papers obtained and will fax).  May need to consider GLP1 next visit if elevations continue.  Recommend he check BS twice daily + continue focus on regular exercise and diet.  At this time he wishes to maintain regimen and get back to exercising. - ARB on board + statin - Eye and foot exam up to date.        Hematopoietic and Hemostatic   Thrombocytopenia (HCC)    Chronic, stable.  Noted on past labs, check CBC next visit.        Other   History of  prostate cancer    Currently no treatment, continue  collaboration with oncology and urology.  Recent notes reviewed.      History of stroke    Continue collaboration with neurology due to significant stroke history.  Continue current medication regimen.  Discussed stroke prevention goals BP <130/80, LDL <55, A1c <6.5%.      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  Continue collaboration with cardiology.      Seizures (HCC)    Chronic, stable.  Being followed by neurology.  Continue current Keppra dose as prescribed by them and collaboration with neurology, reviewed notes.  Continue Amitriptyline for history of headaches post CVA.        Follow up plan: Return in about 3 months (around 02/02/2023) for T2DM, HTN/HLD, OSA, ASTHMA.

## 2022-11-03 NOTE — Assessment & Plan Note (Signed)
Chronic, stable with 100% use of CPAP.  Praised for this. 

## 2022-11-03 NOTE — Assessment & Plan Note (Signed)
Chronic, ongoing with BP below neuro goal <130/80 in office and at home.  Recommend he monitor BP at home daily and document.  Will continue Telmisartan 80 MG daily only.  Continue focus on DASH diet and regular exercise at home.  LABS: CMP.  Continue collaboration with cardiology.  Recommend he wear compression hose at home to avoid syncopal episodes and orthostatic BP.

## 2022-11-03 NOTE — Assessment & Plan Note (Signed)
Chronic.  Noted on exam, continue to monitor and recommend gentle skin care at home + notify provider if skin breakdown presents. 

## 2022-11-03 NOTE — Assessment & Plan Note (Signed)
Chronic, ongoing with cardioversion on 07/04/21 + Amulet procedure 07/18/22.  Continue collaboration with cardiology.  At this time continue ASA and Plavix. 

## 2022-11-03 NOTE — Assessment & Plan Note (Signed)
Chronic, stable.  Being followed by neurology.  Continue current Keppra dose as prescribed by them and collaboration with neurology, reviewed notes.  Continue Amitriptyline for history of headaches post CVA. 

## 2022-11-03 NOTE — Patient Outreach (Signed)
  Care Management   Follow Up Note   11/03/2022 Name: Andrew Cobb MRN: [id number] DOB: 1950-09-17   Referred by: Marjie Skiff, NP Reason for referral : Care Coordination   Successful contact was made with the patient to discuss care management and care coordination services. Patient declines engagement at this time.   Follow Up Plan: Telephone follow up appointment with care management team member scheduled for: November 2024  Artelia Laroche, [place].D. - [phone]     Patient had appt with PCP scheduled for today. Will f/u in November per patient's wishes. They do the PAP for Jardiance themselves, will meet in November to help the process

## 2022-11-03 NOTE — Assessment & Plan Note (Signed)
Chronic, ongoing with A1c 7.4% today, trending back down.  Urine ALB 80 January 2024.  Continue Telmisartan for kidney protection. Neuro goal <6.5%, recommend he take medication daily and focus heavily on diet -- as often at goal.  Continue  Metformin 1000 MG BID and Jardiance 25 MG - obtains via assistance (new papers obtained and will fax).  May need to consider GLP1 next visit if elevations continue.  Recommend he check BS twice daily + continue focus on regular exercise and diet.  At this time he wishes to maintain regimen and get back to exercising. - ARB on board + statin - Eye and foot exam up to date.

## 2022-11-03 NOTE — Assessment & Plan Note (Signed)
Chronic, ongoing due to Eliquis use with A-fib.  Monitor skin for bruising and increased bleeding. 

## 2022-11-03 NOTE — Assessment & Plan Note (Signed)
Overall stable at this time, placed 07/18/22.  Continue collaboration with cardiology. 

## 2022-11-04 LAB — COMPREHENSIVE METABOLIC PANEL
ALT: 40 IU/L (ref 0–44)
AST: 30 IU/L (ref 0–40)
Albumin/Globulin Ratio: 2.2 (ref 1.2–2.2)
Albumin: 4.6 g/dL (ref 3.8–4.8)
Alkaline Phosphatase: 88 IU/L (ref 44–121)
BUN/Creatinine Ratio: 21 (ref 10–24)
BUN: 24 mg/dL (ref 8–27)
Bilirubin Total: 0.5 mg/dL (ref 0.0–1.2)
CO2: 20 mmol/L (ref 20–29)
Calcium: 9.4 mg/dL (ref 8.6–10.2)
Chloride: 105 mmol/L (ref 96–106)
Creatinine, Ser: 1.16 mg/dL (ref 0.76–1.27)
Globulin, Total: 2.1 g/dL (ref 1.5–4.5)
Glucose: 142 mg/dL — ABNORMAL HIGH (ref 70–99)
Potassium: 4.9 mmol/L (ref 3.5–5.2)
Sodium: 141 mmol/L (ref 134–144)
Total Protein: 6.7 g/dL (ref 6.0–8.5)
eGFR: 67 mL/min/{1.73_m2} (ref >59)

## 2022-11-04 LAB — LIPID PANEL W/O CHOL/HDL RATIO
Cholesterol, Total: 121 mg/dL (ref 100–199)
HDL: 55 mg/dL (ref >39)
LDL Chol Calc (NIH): 49 mg/dL (ref 0–99)
Triglycerides: 85 mg/dL (ref 0–149)
VLDL Cholesterol Cal: 17 mg/dL (ref 5–40)

## 2022-11-04 NOTE — Progress Notes (Signed)
Contacted via MyChart   Good morning Andrew Cobb, your labs have returned and overall remain stable.  No medication changes needed.  Any questions? Keep being amazing!!  Thank you for allowing me to participate in your care.  I appreciate you. Kindest regards, Rubyann Lingle

## 2022-11-05 ENCOUNTER — Ambulatory Visit: Payer: Self-pay | Admitting: *Deleted

## 2022-11-05 NOTE — Patient Outreach (Signed)
  Care Coordination   Follow Up Visit Note   11/05/2022 Name: Andrew Cobb MRN: [id number] DOB: Jun 28, 1951  Andrew Cobb is a 72 y.o. year old male who sees Andrew Cobb, Andrew Dandy T, NP for primary care. I spoke with  Andrew Cobb by phone today.  What matters to the patients health and wellness today?  Remaining healthy, have diabetes well managed.     Goals Addressed             This Visit's Progress    Effective management of DM   On track    Care Coordination Interventions: Provided education to patient about basic DM disease process Reviewed medications with patient and discussed importance of medication adherence Counseled on importance of regular laboratory monitoring as prescribed Provided patient with written educational materials related to hypo and hyperglycemia and importance of correct treatment Reviewed scheduled/upcoming provider appointments including: Neuro on 6/25 Advised patient, providing education and rationale, to check cbg daily and record, calling provider for findings outside established parameters          SDOH assessments and interventions completed:  No     Care Coordination Interventions:  Yes, provided   Interventions Today    Flowsheet Row Most Recent Value  Chronic Disease   Chronic disease during today's visit Diabetes  General Interventions   General Interventions Discussed/Reviewed General Interventions Reviewed, Labs, Annual Eye Exam, Annual Foot Exam, Doctor Visits  Labs Hgb A1c every 3 months  [A1C decreased from 7.8 to 7.4]  Doctor Visits Discussed/Reviewed Doctor Visits Reviewed, Annual Wellness Visits, PCP  [Neuro on 6/25 and PCP on 7/30]  PCP/Specialist Visits Compliance with follow-up visit  [AWV done on 4/22 and last PCP completed on 4/29]  Exercise Interventions   Exercise Discussed/Reviewed Exercise Reviewed  Education Interventions   Education Provided Provided Education  Provided Verbal Education On Foot Care,  Nutrition, Labs, Medication, When to see the doctor, Blood Sugar Monitoring  Labs Reviewed Hgb A1c  Nutrition Interventions   Nutrition Discussed/Reviewed Nutrition Reviewed, Decreasing sugar intake        Follow up plan: Follow up call scheduled for 7/31    Encounter Outcome:  Pt. Visit Completed   [name], RN, MSN, [facility] Care Management Care Management Coordinator [phone]

## 2022-11-25 ENCOUNTER — Encounter: Payer: Self-pay | Admitting: Nurse Practitioner

## 2022-11-25 ENCOUNTER — Telehealth: Payer: Self-pay | Admitting: Nurse Practitioner

## 2022-11-25 DIAGNOSIS — J189 Pneumonia, unspecified organism: Secondary | ICD-10-CM | POA: Diagnosis not present

## 2022-11-25 DIAGNOSIS — I2510 Atherosclerotic heart disease of native coronary artery without angina pectoris: Secondary | ICD-10-CM | POA: Diagnosis not present

## 2022-11-25 DIAGNOSIS — G4733 Obstructive sleep apnea (adult) (pediatric): Secondary | ICD-10-CM | POA: Diagnosis not present

## 2022-11-25 NOTE — Telephone Encounter (Unsigned)
Copied from CRM [id number]. Topic: General - Other >> Nov 25, 2022  9:04 AM Patsy Lager T wrote: Reason for CRM: patients spouse called stated patient needs a note for work that states he has been cleared to drive. Please f/u as she will come by to pick up the note as soon as possible for patient to do his job

## 2022-11-26 DIAGNOSIS — Z961 Presence of intraocular lens: Secondary | ICD-10-CM | POA: Diagnosis not present

## 2022-11-26 DIAGNOSIS — E119 Type 2 diabetes mellitus without complications: Secondary | ICD-10-CM | POA: Diagnosis not present

## 2022-11-26 DIAGNOSIS — H02831 Dermatochalasis of right upper eyelid: Secondary | ICD-10-CM | POA: Diagnosis not present

## 2022-11-26 DIAGNOSIS — H02834 Dermatochalasis of left upper eyelid: Secondary | ICD-10-CM | POA: Diagnosis not present

## 2022-11-26 DIAGNOSIS — H353211 Exudative age-related macular degeneration, right eye, with active choroidal neovascularization: Secondary | ICD-10-CM | POA: Diagnosis not present

## 2022-11-26 DIAGNOSIS — H353122 Nonexudative age-related macular degeneration, left eye, intermediate dry stage: Secondary | ICD-10-CM | POA: Diagnosis not present

## 2022-11-26 DIAGNOSIS — H35371 Puckering of macula, right eye: Secondary | ICD-10-CM | POA: Diagnosis not present

## 2022-11-26 LAB — HM DIABETES EYE EXAM

## 2022-11-28 ENCOUNTER — Encounter: Payer: Self-pay | Admitting: Nurse Practitioner

## 2022-12-04 ENCOUNTER — Encounter: Payer: Self-pay | Admitting: Nurse Practitioner

## 2022-12-04 DIAGNOSIS — H43821 Vitreomacular adhesion, right eye: Secondary | ICD-10-CM | POA: Diagnosis not present

## 2022-12-04 DIAGNOSIS — H353211 Exudative age-related macular degeneration, right eye, with active choroidal neovascularization: Secondary | ICD-10-CM | POA: Diagnosis not present

## 2022-12-04 DIAGNOSIS — H353122 Nonexudative age-related macular degeneration, left eye, intermediate dry stage: Secondary | ICD-10-CM | POA: Diagnosis not present

## 2022-12-04 DIAGNOSIS — H35721 Serous detachment of retinal pigment epithelium, right eye: Secondary | ICD-10-CM | POA: Diagnosis not present

## 2022-12-04 DIAGNOSIS — E119 Type 2 diabetes mellitus without complications: Secondary | ICD-10-CM | POA: Diagnosis not present

## 2022-12-04 LAB — HM DIABETES EYE EXAM

## 2022-12-08 ENCOUNTER — Encounter: Payer: Self-pay | Admitting: Nurse Practitioner

## 2022-12-08 MED ORDER — ALBUTEROL SULFATE HFA 108 (90 BASE) MCG/ACT IN AERS: 2.0000 | INHALATION_SPRAY | Freq: Four times a day (QID) | RESPIRATORY_TRACT | 4 refills | Status: DC | PRN

## 2022-12-09 ENCOUNTER — Other Ambulatory Visit: Payer: Self-pay | Admitting: Nurse Practitioner

## 2022-12-09 DIAGNOSIS — E119 Type 2 diabetes mellitus without complications: Secondary | ICD-10-CM

## 2022-12-09 MED ORDER — ACYCLOVIR 400 MG PO TABS: 400.0000 mg | ORAL_TABLET | Freq: Two times a day (BID) | ORAL | 0 refills | Status: DC

## 2022-12-09 MED ORDER — ONETOUCH ULTRA VI STRP
ORAL_STRIP | 0 refills | Status: DC
Start: 2022-12-09 — End: 2023-03-16

## 2022-12-09 NOTE — Telephone Encounter (Signed)
Resent test strips to [employer].

## 2022-12-09 NOTE — Telephone Encounter (Signed)
Requested Prescriptions  Pending Prescriptions Disp Refills   glucose blood (ONETOUCH ULTRA) test strip 100 each 0    Sig: USE TO CHECK GLUCOSE TWICE DAILY. USE AS INSTRUCTED     Endocrinology: Diabetes - Testing Supplies Passed - 12/09/2022 11:28 AM      Passed - Valid encounter within last 12 months    Recent Outpatient Visits           1 month ago Uncontrolled type 2 diabetes mellitus with hyperglycemia, without long-term current use of insulin (HCC)   [place] [facility] [place], Edgard T, NP   4 months ago Persistent atrial fibrillation (HCC)   [place] [facility] [place], Nunda T, NP   4 months ago Uncontrolled type 2 diabetes mellitus with hyperglycemia, without long-term current use of insulin (HCC)   [place] [facility] [place], Benson T, NP   6 months ago Acute sinusitis, recurrence not specified, unspecified location   [place] [facility] M[place], Erin E, PA-C   7 months ago Upper respiratory tract infection, unspecified type   [place] [facility] M[place]um, Oswaldo Conroy, PA-C       Future Appointments             In 1 month [name], Moorhead T, NP [place] [facility]             acyclovir (ZOVIRAX) 400 MG tablet 180 tablet 0    Sig: Take 1 tablet (400 mg total) by mouth 2 (two) times daily.     Antimicrobials:  Antiviral Agents - Anti-Herpetic Passed - 12/09/2022 11:28 AM      Passed - Valid encounter within last 12 months    Recent Outpatient Visits           1 month ago Uncontrolled type 2 diabetes mellitus with hyperglycemia, without long-term current use of insulin (HCC)   [place] [facility] [place], Milo T, NP   4 months ago Persistent atrial fibrillation (HCC)   [place] [facility] [place], Irvine T, NP   4 months ago Uncontrolled type 2 diabetes mellitus with hyperglycemia, without long-term current use of insulin (HCC)    [place] [facility] [place], [name], NP   6 months ago Acute sinusitis, recurrence not specified, unspecified location   [place] [facility] Mecum, Oswaldo Conroy, PA-C   7 months ago Upper respiratory tract infection, unspecified type   [place] [facility] Mecum, Oswaldo Conroy, PA-C       Future Appointments             In 1 month [name], Dorie Rank, NP [place] [facility], [facility]

## 2022-12-09 NOTE — Telephone Encounter (Signed)
Pt changed their mind and states that they want their Test strips sent to  [facility] [place], [place] - [address] [place]  [address] [place] [address]  Phone: [phone] Fax: [phone]  Hours: Not open 24 hours   And he just added another medication:   Medication Refill - Medication: valACYclovir (VALTREX) 500 MG tablet   Has the patient contacted their pharmacy? Yes.   (Agent: If no, request that the patient contact the pharmacy for the refill. If patient does not wish to contact the pharmacy document the reason why and proceed with request.) (Agent: If yes, when and what did the pharmacy advise?)  Preferred Pharmacy (with phone number or street name):  [facility] [place], [place] - [address] [place]  [address] [place] [address]  Phone: [phone] Fax: [phone]  Hours: Not open 24 hours   Has the patient been seen for an appointment in the last year OR does the patient have an upcoming appointment? Yes.    Agent: Please be advised that RX refills may take up to 3 business days. We ask that you follow-up with your pharmacy.

## 2022-12-09 NOTE — Telephone Encounter (Signed)
Also the patient says his pharmacy is not giving him his albuterol until 12/14/2022 even though the written Rx says start date 12/08/2022 on the receipt. Pt is requesting for the clinic to intervene  Medication Refill - Medication: glucose blood (ONETOUCH ULTRA) test strip   Has the patient contacted their pharmacy? Yes.   (Agent: If no, request that the patient contact the pharmacy for the refill. If patient does not wish to contact the pharmacy document the reason why and proceed with request.) (Agent: If yes, when and what did the pharmacy advise?)  Preferred Pharmacy (with phone number or street name):  [facility] [address]),  - [address]  [address] [place]) [place] [address]  Phone: [phone] Fax: [phone]   Has the patient been seen for an appointment in the last year OR does the patient have an upcoming appointment? Yes.    Agent: Please be advised that RX refills may take up to 3 business days. We ask that you follow-up with your pharmacy.

## 2022-12-10 ENCOUNTER — Telehealth: Payer: Self-pay

## 2022-12-10 NOTE — Progress Notes (Unsigned)
Care Management & Coordination Services Pharmacy Team  Reason for Encounter: Diabetes  Contacted patient to discuss diabetes disease state. {US HC Outreach:28874}  Recent office visits:  11/03/22-Jolene T. Harvest Dark, NP (PCP) Seen for a general follow up visit. May need to increase Atorvastatin 80 mg. Follow up in 3 months.   Recent consult visits:  None noted  Hospital visits:  None in previous 6 months  Medications: Outpatient Encounter Medications as of 12/10/2022  Medication Sig   acyclovir (ZOVIRAX) 400 MG tablet Take 1 tablet (400 mg total) by mouth 2 (two) times daily.   albuterol (VENTOLIN HFA) 108 (90 Base) MCG/ACT inhaler Inhale 2 puffs into the lungs every 6 (six) hours as needed for wheezing or shortness of breath.   amitriptyline (ELAVIL) 25 MG tablet Take 1 tablet (25 mg total) by mouth at bedtime.   aspirin EC 81 MG tablet Take 81 mg by mouth daily.   atorvastatin (LIPITOR) 40 MG tablet Take 1 tablet (40 mg total) by mouth at bedtime.   cetirizine (ZYRTEC) 10 MG tablet Take 10 mg by mouth daily.   Cholecalciferol (VITAMIN D) 2000 units CAPS Take 2,000 Units by mouth daily.   clobetasol cream (TEMOVATE) 0.05 % Apply 1 Application topically 2 (two) times daily.   Cyanocobalamin (B-12) 5000 MCG CAPS Take 5,000 mcg by mouth every other day.   empagliflozin (JARDIANCE) 25 MG TABS tablet Take 1 tablet (25 mg total) by mouth daily.   fluticasone (FLONASE) 50 MCG/ACT nasal spray Place 2 sprays into both nostrils daily. (Patient taking differently: Place 2 sprays into both nostrils daily as needed for allergies.)   glucose blood (ONETOUCH ULTRA) test strip USE TO CHECK GLUCOSE TWICE DAILY. USE AS INSTRUCTED   Lancets (ONETOUCH ULTRASOFT) lancets USE TO CHECK BLOOD SUAGR TWICE DAILY   levETIRAcetam (KEPPRA) 500 MG tablet Take 1 tablet (500 mg total) by mouth 2 (two) times daily.   metFORMIN (GLUCOPHAGE) 1000 MG tablet Take 1 tablet (1,000 mg total) by mouth 2 (two) times daily with  a meal.   Multiple Vitamins-Minerals (MULTIVITAMIN PO) Take 1 tablet by mouth daily.    tamsulosin (FLOMAX) 0.4 MG CAPS capsule Take 1 capsule (0.4 mg total) by mouth at bedtime.   telmisartan (MICARDIS) 80 MG tablet Take 1 tablet (80 mg total) by mouth daily.   Facility-Administered Encounter Medications as of 12/10/2022  Medication   0.9 %  sodium chloride infusion   0.9 %  sodium chloride infusion    Recent Relevant Labs: Lab Results  Component Value Date/Time   HGBA1C 7.4 (H) 11/03/2022 08:28 AM   HGBA1C 7.8 (H) 07/16/2022 09:06 AM   MICROALBUR 80 (H) 07/16/2022 09:06 AM   MICROALBUR 30 (H) 04/15/2022 09:10 AM    Kidney Function Lab Results  Component Value Date/Time   CREATININE 1.16 11/03/2022 08:29 AM   CREATININE 1.36 (H) 07/16/2022 09:10 AM   CREATININE 0.99 07/29/2011 11:02 AM   GFR 49.88 (L) 10/14/2019 10:25 AM   GFRNONAA >60 12/11/2021 09:37 AM   GFRNONAA >60 07/29/2011 11:02 AM   GFRAA 67 08/27/2020 10:54 AM   GFRAA >60 07/29/2011 11:02 AM   Current antihyperglycemic regimen:  Jardiance 25 mg take 1 tablet daily Metformin 1000 mg take 1 tablet twice daily   Patient verbally confirms he is taking the above medications as directed. {yes/no:20286}  What diet changes have been made to improve diabetes control?  What recent interventions/DTPs have been made to improve glycemic control:  None noted  Have there been any  recent hospitalizations or ED visits since last visit with PharmD? No  Patient {reports/denies:24182} hypoglycemic symptoms, including {Hypoglycemic Symptoms:3049003}  Patient {reports/denies:24182} hyperglycemic symptoms, including {symptoms; hyperglycemia:17903}  How often are you checking your blood sugar? {BG Testing frequency:23922}  What are your blood sugars ranging?  Fasting:  Before meals:  After meals:  Bedtime: /   During the week, how often does your blood glucose drop below 70? {LowBGfrequency:24142}  Are you checking your feet  daily/regularly? {yes/no:20286}  Adherence Review: Is the patient currently on a STATIN medication? Yes Is the patient currently on ACE/ARB medication? Yes Does the patient have >5 day gap between last estimated fill dates? No   Star Rating Drugs:  Atorvastatin 40 mg Last filled:07/04/22 90 DS, 10/08/22 90 DS Jardiance 25 mg Last filled:N/a Metformin 1000 mg Last filled:07/25/22 90 DS, 10/08/22 90 DS Telmisartan 80 mg Last filled:08/26/22 90 DS, 11/18/22 90 DS  Care Gaps: Annual wellness visit in last year? Yes Last eye exam / retinopathy screening:12/04/22 Last diabetic foot exam:11/03/22   Rance Muir, RMA

## 2022-12-26 DIAGNOSIS — J189 Pneumonia, unspecified organism: Secondary | ICD-10-CM | POA: Diagnosis not present

## 2022-12-26 DIAGNOSIS — G4733 Obstructive sleep apnea (adult) (pediatric): Secondary | ICD-10-CM | POA: Diagnosis not present

## 2022-12-26 DIAGNOSIS — I2510 Atherosclerotic heart disease of native coronary artery without angina pectoris: Secondary | ICD-10-CM | POA: Diagnosis not present

## 2022-12-30 ENCOUNTER — Ambulatory Visit: Payer: PPO | Admitting: Neurology

## 2022-12-30 NOTE — Telephone Encounter (Signed)
I called pt and cancelled appt he is doing well, no issues.  Last appt 03-2022 with Shanda Bumps NP.  He needs yearly appt with her 03/2023.  Will have phone staff call and set up appt with Shanda Bumps NP for 03-2023 (yearly f/u)

## 2022-12-31 ENCOUNTER — Encounter: Payer: Self-pay | Admitting: *Deleted

## 2022-12-31 ENCOUNTER — Telehealth: Payer: Self-pay | Admitting: *Deleted

## 2022-12-31 NOTE — Telephone Encounter (Deleted)
I made appointment for him with Shanda Bumps, NP for annual follow up on 03/31/2023 at 3:45 pm.  Mailed letter. I realized this was attached to another note.

## 2022-12-31 NOTE — Telephone Encounter (Signed)
December 31, 2022 Me      12/31/22  9:23 AM Note I made appointment for him with Shanda Bumps, NP for annual follow up on 03/31/2023 at 3:45 pm.  Mailed letter. I realized this was attached to another note.   December 30, 2022  Ileene Hutchinson  to Me  LB   12/30/22  9:37 AM Pt was called, a brief message was left asking pt to call to schedule needed f/u    Me  to [facility] Phone Room      12/30/22  7:41 AM Needs f/u Shanda Bumps NP for 03/2023   Me      12/30/22  7:39 AM Note I called pt and cancelled appt he is doing well, no issues.  Last appt 03-2022 with Shanda Bumps NP.  He needs yearly appt with her 03/2023.  Will have phone staff call and set up appt with Shanda Bumps NP for 03-2023 (yearly f/u)          12/30/22  7:38 AM You contacted [name]

## 2023-01-25 DIAGNOSIS — I2510 Atherosclerotic heart disease of native coronary artery without angina pectoris: Secondary | ICD-10-CM | POA: Diagnosis not present

## 2023-01-25 DIAGNOSIS — G4733 Obstructive sleep apnea (adult) (pediatric): Secondary | ICD-10-CM | POA: Diagnosis not present

## 2023-01-25 DIAGNOSIS — J189 Pneumonia, unspecified organism: Secondary | ICD-10-CM | POA: Diagnosis not present

## 2023-02-01 NOTE — Patient Instructions (Signed)
Be Involved in Caring For Your Health:  Taking Medications When medications are taken as directed, they can greatly improve your health. But if they are not taken as prescribed, they may not work. In some cases, not taking them correctly can be harmful. To help ensure your treatment remains effective and safe, understand your medications and how to take them. Bring your medications to each visit for review by your provider.  Your lab results, notes, and after visit summary will be available on My Chart. We strongly encourage you to use this feature. If lab results are abnormal the clinic will contact you with the appropriate steps. If the clinic does not contact you assume the results are satisfactory. You can always view your results on My Chart. If you have questions regarding your health or results, please contact the clinic during office hours. You can also ask questions on My Chart.  We at [facility] are grateful that you chose [place] to provide your care. We strive to provide evidence-based and compassionate care and are always looking for feedback. If you get a survey from the clinic please complete this so we can hear your opinions.  Diabetes Mellitus and Skin Care Diabetes, also called diabetes mellitus, can lead to skin problems. If blood sugar (glucose) is not well controlled, it can cause problems over time. These problems include: Damage to nerves. This can affect your ability to feel wounds. This means you may not notice small skin injuries that could lead to bigger problems. This can also decrease the amount that you sweat, causing dry skin. Damage to blood vessels. The lack of blood flow can cause skin to break down. It can also slow healing time, which can lead to infections. Areas of skin that become thick or discolored. Common skin conditions There are certain skin conditions that often affect people with diabetes. These include: Dry skin. Thin skin. The skin on the feet  may get thinner, break more easily, and heal more slowly than normal. Skin infections from bacteria. These include: Styes. These are infections near the eyelid. Boils. These are bumps filled with pus. Infected hair follicles. Infections of the skin around the nails. Fungal skin infections. These are most common in areas where skin rubs together, such as in the armpits or under the breasts. Common skin changes Diabetes can also cause the skin to change. You may develop: Dark, velvety markings on your skin. These may appear on your face, neck, armpits, inner thighs, and groin. Red, raised, scar-like tissue that may itch, feel painful, or become a wound. Blisters on your feet, toes, hands, or fingers. Thick, wax-like areas of skin. In most cases, these occur on the hands, forehead, or toes. Brown or red, ring-shaped or half-ring-shaped patches of skin on the ears or fingers. Pea-shaped, yellow bumps that may be itchy and have a red ring around them. This may affect your arms, feet, buttocks, and the top of your hands. Round, discolored patches of tan skin that do not hurt or itch. These may look like age spots. Supplies needed: Mild soap or gentle skin cleanser. Lotion. How to care for dry, itchy skin Frequent high glucose levels can cause skin to become itchy. Poor blood circulation and skin infections can make dry skin worse. If you have dry, itchy skin: Avoid very hot showers and baths. Use mild soap and gentle skin cleansers. Do not use soap that is perfumed, harsh, or that dries your skin. Moisturizing soaps may help. Put on moisturizing  lotion as soon as you finish bathing. Do not scratch dry skin. Scratching can expose skin to infection. If you have a rash or if your skin is very itchy, contact your health care provider. Skin that is red or covered in a rash may be a sign of an allergic reaction. Very itchy skin may mean that you need help to manage your diabetes better. You may also  need treatment for an infection. General tips Most skin problems can be prevented or treated easily if caught early. Talk with your health care provider if you have any concerns. General tips include: Check your skin every day for cuts, bruises, redness, blisters, or sores, especially on your feet. If you cannot see the bottom of your feet, use a mirror or ask someone for help. Tell your health care provider if you have any of these injuries and if they are healing slowly. Keep your skin clean and dry. Do not use hot water. Moisturize your skin to prevent chapping. Keep your blood glucose levels within target range. Follow these instructions at home:  Take over-the-counter and prescription medicines only as told by your health care provider. This includes all diabetes medicines you are taking. Schedule a foot exam with your health care provider once a year. During the exam, the structure and skin of your feet will be checked for problems. Make sure that your health care provider does a visual foot exam at every visit. If you get a skin injury, such as a cut, blister, or sore, check the area every day for signs of infection. Check for: Redness, swelling, or pain. Fluid or blood. Warmth. Pus or a bad smell. Do not use any products that contain nicotine or tobacco. These products include cigarettes, chewing tobacco, and vaping devices, such as e-cigarettes. If you need help quitting, ask your health care provider. Where to find more information American Diabetes Association: diabetes.org Association of Diabetes Care & Education Specialists: diabeteseducator.org Contact a health care provider if: You get a cut or sore, especially on your feet. You have signs of infection after a skin injury. You have itchy skin that turns red or develops a rash. You have discolored areas of skin. You have places on your skin that change. They may thicken or appear shiny. This information is not intended to  replace advice given to you by your health care provider. Make sure you discuss any questions you have with your health care provider. Document Revised: 12/25/2021 Document Reviewed: 12/25/2021 Elsevier Patient Education  2024 [employer].

## 2023-02-03 ENCOUNTER — Ambulatory Visit (INDEPENDENT_AMBULATORY_CARE_PROVIDER_SITE_OTHER): Payer: PPO | Admitting: Nurse Practitioner

## 2023-02-03 ENCOUNTER — Encounter: Payer: Self-pay | Admitting: Nurse Practitioner

## 2023-02-03 VITALS — BP 128/76 | HR 50 | Temp 97.6°F | Ht 70.0 in | Wt 204.6 lb

## 2023-02-03 DIAGNOSIS — J4520 Mild intermittent asthma, uncomplicated: Secondary | ICD-10-CM | POA: Diagnosis not present

## 2023-02-03 DIAGNOSIS — E1169 Type 2 diabetes mellitus with other specified complication: Secondary | ICD-10-CM | POA: Diagnosis not present

## 2023-02-03 DIAGNOSIS — D692 Other nonthrombocytopenic purpura: Secondary | ICD-10-CM | POA: Diagnosis not present

## 2023-02-03 DIAGNOSIS — E1159 Type 2 diabetes mellitus with other circulatory complications: Secondary | ICD-10-CM

## 2023-02-03 DIAGNOSIS — I152 Hypertension secondary to endocrine disorders: Secondary | ICD-10-CM

## 2023-02-03 DIAGNOSIS — I4819 Other persistent atrial fibrillation: Secondary | ICD-10-CM

## 2023-02-03 DIAGNOSIS — Z8673 Personal history of transient ischemic attack (TIA), and cerebral infarction without residual deficits: Secondary | ICD-10-CM

## 2023-02-03 DIAGNOSIS — D696 Thrombocytopenia, unspecified: Secondary | ICD-10-CM

## 2023-02-03 DIAGNOSIS — G4733 Obstructive sleep apnea (adult) (pediatric): Secondary | ICD-10-CM | POA: Diagnosis not present

## 2023-02-03 DIAGNOSIS — R569 Unspecified convulsions: Secondary | ICD-10-CM

## 2023-02-03 DIAGNOSIS — E1165 Type 2 diabetes mellitus with hyperglycemia: Secondary | ICD-10-CM

## 2023-02-03 DIAGNOSIS — E785 Hyperlipidemia, unspecified: Secondary | ICD-10-CM

## 2023-02-03 DIAGNOSIS — I872 Venous insufficiency (chronic) (peripheral): Secondary | ICD-10-CM

## 2023-02-03 LAB — BAYER DCA HB A1C WAIVED: HB A1C (BAYER DCA - WAIVED): 7.4 % — ABNORMAL HIGH (ref 4.8–5.6)

## 2023-02-03 NOTE — Assessment & Plan Note (Signed)
Chronic, ongoing with A1c 7.4% today, no change from previous.  Urine ALB 80 January 2024.  Continue Telmisartan for kidney protection. Neuro goal <6.5%, recommend he take medication daily and focus heavily on diet -- as often is at goal.  Continue  Metformin 1000 MG BID and Jardiance 25 MG - obtains via assistance (new papers obtained and will fax).  May need to consider GLP1 next visit if elevations continue -- discussed with him and he prefers to no add any medication at this time.  Recommend he check BS twice daily + continue focus on regular exercise and diet.   - ARB on board + statin - Eye and foot exam up to date. - Vaccinations up to date with exception of Shingrix.

## 2023-02-03 NOTE — Assessment & Plan Note (Signed)
Chronic, ongoing with BP below neuro goal <130/80 in office and at home.  Recommend he monitor BP at home daily and document.  Will continue Telmisartan 80 MG daily only.  Continue focus on DASH diet and regular exercise at home.  LABS: BMP and CBC.  Continue collaboration with cardiology.  Recommend he wear compression hose at home to avoid syncopal episodes and orthostatic BP.

## 2023-02-03 NOTE — Progress Notes (Signed)
BP 128/76 (BP Location: Left Arm)   Pulse (!) 50   Temp 97.6 F (36.4 C) (Oral)   Ht 5\' 10"  (1.778 m)   Wt 204 lb 9.6 oz (92.8 kg)   SpO2 95%   BMI 29.36 kg/m    Subjective:    Patient ID: Andrew Cobb, male    DOB: February 25, 1951, 72 y.o.   MRN: [id number]  HPI: Andrew Cobb is a 72 y.o. male  Chief Complaint  Patient presents with   Diabetes   Hypertension   Hyperlipidemia   Asthma   Sleep Apnea   DIABETES A1c April was 7.4%.  He continues on Metformin 1000 MG BID and Jardiance 25 MG -- gets assistance with Jardiance.   Has not had a milkshake in 4 months + is working on diet changes.  Continues to go to [facility] and use pool.   Followed by ophthalmology for macular degeneration and gets injections -- has visit tomorrow with them.  Has been getting injections for about 1 1/2 years. Hypoglycemic episodes:no Polydipsia/polyuria: no Visual disturbance: no Chest pain: no Paresthesias: no Glucose Monitoring: yes  Accucheck frequency: Daily  Fasting glucose: 110 -120  Post prandial:  Evening:  Before meals: Taking Insulin?: no  Long acting insulin:  Short acting insulin: Blood Pressure Monitoring: daily Retinal Examination: Up to Date continues to go for injections Foot Exam: Up to Date Pneumovax: Up to Date Influenza: Up to Date Aspirin: no   HYPERTENSION / HYPERLIPIDEMIA Continues Telmisartan 80 MG daily and Atorvastatin 40 MG daily. Had aortic atherosclerosis on imaging 07/11/20.  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 Last saw cardiology 10/08/22.   Follows with neurology due to history of stroke events x 3, last saw neurology 03/25/22 -- returns to see them tomorrow. History of loop recorder which was removed > 1 year ago.  CVA caused some mild expressive aphasia.  Takes Keppra for history of seizure like activity post CVAs + Amitriptyline for headaches post CVA.    Continues to use CPAP nightly, 100% of the time -- has had for > 30  years.. Satisfied with current treatment? yes Duration of hypertension: chronic BP monitoring frequency: daily BP range: 130/70 on average BP medication side effects: no Duration of hyperlipidemia: chronic Cholesterol medication side effects: no Cholesterol supplements: none Medication compliance: good compliance Aspirin: no Recent stressors: no Recurrent headaches: no Visual changes: no Palpitations: no Dyspnea: no Chest pain: no Lower extremity edema: no Dizzy/lightheaded: no  The ASCVD Risk score (Arnett DK, et al., 2019) failed to calculate for the following reasons:   The patient has a prior MI or stroke diagnosis  ATRIAL FIBRILLATION Followed with cardiology.  Atrial fibrillation status: stable Satisfied with current treatment: yes  Medication side effects:  no Medication compliance: good compliance Etiology of atrial fibrillation:  Palpitations:  no Chest pain:  no Dyspnea on exertion:  no Orthopnea:  no Syncope:  no Edema:  no Ventricular rate control: Not indicated Anti-coagulation: not indicated   ASTHMA Asthma status: stable Satisfied with current treatment?: yes Albuterol/rescue inhaler frequency: once or twice a week Dyspnea frequency: none Wheezing frequency: none Cough frequency: none Nocturnal symptom frequency: none Limitation of activity: no Current upper respiratory symptoms: no Failed/intolerant to following asthma meds:  Asthma meds in past: as above Aerochamber/spacer use: no Visits to ER or Urgent Care in past year: no Pneumovax: Up to Date Influenza: Up to Date   Relevant past medical, surgical, family and social history reviewed and  updated as indicated. Interim medical history since our last visit reviewed. Allergies and medications reviewed and updated.  Review of Systems  Constitutional:  Negative for activity change, diaphoresis, fatigue and fever.  Respiratory:  Negative for cough, chest tightness, shortness of breath and  wheezing.   Cardiovascular:  Negative for chest pain, palpitations and leg swelling.  Gastrointestinal: Negative.   Neurological: Negative.   Psychiatric/Behavioral: Negative.      Per HPI unless specifically indicated above     Objective:    BP 128/76 (BP Location: Left Arm)   Pulse (!) 50   Temp 97.6 F (36.4 C) (Oral)   Ht 5\' 10"  (1.778 m)   Wt 204 lb 9.6 oz (92.8 kg)   SpO2 95%   BMI 29.36 kg/m   Wt Readings from Last 3 Encounters:  02/03/23 204 lb 9.6 oz (92.8 kg)  11/03/22 206 lb 1.6 oz (93.5 kg)  10/27/22 203 lb (92.1 kg)    Physical Exam Vitals and nursing note reviewed.  Constitutional:      General: He is awake. He is not in acute distress.    Appearance: He is well-developed and well-groomed. He is not ill-appearing.  HENT:     Head: Normocephalic and atraumatic.     Right Ear: Hearing normal. No drainage.     Left Ear: Hearing normal. No drainage.  Eyes:     General: Lids are normal.        Right eye: No discharge.        Left eye: No discharge.     Conjunctiva/sclera: Conjunctivae normal.     Pupils: Pupils are equal, round, and reactive to light.  Neck:     Thyroid: No thyromegaly.     Vascular: No carotid bruit or JVD.     Trachea: Trachea normal.  Cardiovascular:     Rate and Rhythm: Regular rhythm. Bradycardia present.     Heart sounds: Normal heart sounds, S1 normal and S2 normal. No murmur heard.    No gallop.  Pulmonary:     Effort: Pulmonary effort is normal. No accessory muscle usage or respiratory distress.     Breath sounds: Normal breath sounds.  Abdominal:     General: Bowel sounds are normal.     Palpations: Abdomen is soft. There is no hepatomegaly or splenomegaly.  Musculoskeletal:        General: Normal range of motion.     Cervical back: Normal range of motion and neck supple.     Right lower leg: No edema.     Left lower leg: No edema.  Skin:    General: Skin is warm and dry.     Capillary Refill: Capillary refill takes  less than 2 seconds.     Findings: Bruising present.     Comments: Bilateral bruises scattered to upper extremities.  Neurological:     Mental Status: He is alert and oriented to person, place, and time.     Deep Tendon Reflexes: Reflexes are normal and symmetric.  Psychiatric:        Attention and Perception: Attention normal.        Mood and Affect: Mood normal.        Speech: Speech normal.        Behavior: Behavior normal. Behavior is cooperative.        Thought Content: Thought content normal.    Results for orders placed or performed in visit on 12/04/22  HM DIABETES EYE EXAM  Result Value Ref Range  HM Diabetic Eye Exam No Retinopathy No Retinopathy      Assessment & Plan:   Problem List Items Addressed This Visit       Cardiovascular and Mediastinum   Hypertension associated with diabetes (HCC)    Chronic, ongoing with BP below neuro goal <130/80 in office and at home.  Recommend he monitor BP at home daily and document.  Will continue Telmisartan 80 MG daily only.  Continue focus on DASH diet and regular exercise at home.  LABS: BMP and CBC.  Continue collaboration with cardiology.  Recommend he wear compression hose at home to avoid syncopal episodes and orthostatic BP.        Relevant Orders   Bayer DCA Hb A1c Waived   Basic metabolic panel   Persistent atrial fibrillation (HCC)    Chronic, ongoing with cardioversion on 07/04/21 + Amulet procedure 07/18/22.  Continue collaboration with cardiology.  At this time continue ASA.      Relevant Orders   Basic metabolic panel   Senile purpura (HCC)    Chronic.  Noted on exam, continue to monitor and recommend gentle skin care at home + notify provider if skin breakdown presents.      Relevant Orders   CBC with Differential/Platelet     Respiratory   Asthma    Chronic, ongoing.  Stable at this time.  Continue current medication regimen and collaboration with pulmonary as needed.      Relevant Orders   CBC with  Differential/Platelet   OSA (obstructive sleep apnea)    Chronic, stable with 100% use of CPAP.  Praised for this.      Relevant Orders   CBC with Differential/Platelet     Endocrine   Hyperlipidemia associated with type 2 diabetes mellitus (HCC)    Chronic, ongoing.  Continue tight lipid control, may need to increase Atorvastatin to 80 MG if ever elevation, recent LDL below goal for stroke prevention.  Continue current Atorvastatin dosing.  Lipid check today.      Relevant Orders   Bayer DCA Hb A1c Waived   Uncontrolled type 2 diabetes mellitus with hyperglycemia, without long-term current use of insulin (HCC) - Primary    Chronic, ongoing with A1c 7.4% today, no change from previous.  Urine ALB 80 January 2024.  Continue Telmisartan for kidney protection. Neuro goal <6.5%, recommend he take medication daily and focus heavily on diet -- as often is at goal.  Continue  Metformin 1000 MG BID and Jardiance 25 MG - obtains via assistance (new papers obtained and will fax).  May need to consider GLP1 next visit if elevations continue -- discussed with him and he prefers to no add any medication at this time.  Recommend he check BS twice daily + continue focus on regular exercise and diet.   - ARB on board + statin - Eye and foot exam up to date. - Vaccinations up to date with exception of Shingrix.      Relevant Orders   Bayer DCA Hb A1c Waived     Hematopoietic and Hemostatic   Thrombocytopenia (HCC)    Chronic, stable.  Noted on past labs, check CBC every 6 months and monitor closely.  Send to hematology as needed.      Relevant Orders   CBC with Differential/Platelet     Other   History of stroke    Continue collaboration with neurology due to significant stroke history.  Continue current medication regimen.  Discussed stroke prevention goals BP <130/80, LDL <  55, A1c <6.5%.      Relevant Orders   Basic metabolic panel   Seizures (HCC)    Chronic, stable.  Being followed by  neurology.  Continue current Keppra dose as prescribed by them and collaboration with neurology, reviewed notes.  Continue Amitriptyline for history of headaches post CVA.      Relevant Orders   Basic metabolic panel     Follow up plan: Return in about 3 months (around 05/06/2023) for T2DM, HTN/HLD.

## 2023-02-03 NOTE — Assessment & Plan Note (Signed)
Chronic, stable.  Noted on past labs, check CBC every 6 months and monitor closely.  Send to hematology as needed.

## 2023-02-03 NOTE — Assessment & Plan Note (Addendum)
Chronic, ongoing with cardioversion on 07/04/21 + Amulet procedure 07/18/22.  Continue collaboration with cardiology.  At this time continue ASA.

## 2023-02-03 NOTE — Assessment & Plan Note (Signed)
Chronic, ongoing.  Stable at this time.  Continue current medication regimen and collaboration with pulmonary as needed.

## 2023-02-03 NOTE — Assessment & Plan Note (Signed)
Chronic, stable with 100% use of CPAP.  Praised for this.

## 2023-02-03 NOTE — Assessment & Plan Note (Signed)
Chronic, stable.  Being followed by neurology.  Continue current Keppra dose as prescribed by them and collaboration with neurology, reviewed notes.  Continue Amitriptyline for history of headaches post CVA. 

## 2023-02-03 NOTE — Assessment & Plan Note (Signed)
Chronic, ongoing.  Continue tight lipid control, may need to increase Atorvastatin to 80 MG if ever elevation, recent LDL below goal for stroke prevention.  Continue current Atorvastatin dosing.  Lipid check today. 

## 2023-02-03 NOTE — Assessment & Plan Note (Signed)
Continue collaboration with neurology due to significant stroke history.  Continue current medication regimen.  Discussed stroke prevention goals BP <130/80, LDL <55, A1c <6.5%.

## 2023-02-03 NOTE — Assessment & Plan Note (Signed)
Chronic.  Noted on exam, continue to monitor and recommend gentle skin care at home + notify provider if skin breakdown presents. 

## 2023-02-04 ENCOUNTER — Ambulatory Visit: Payer: Self-pay | Admitting: *Deleted

## 2023-02-04 DIAGNOSIS — Z981 Arthrodesis status: Secondary | ICD-10-CM | POA: Diagnosis not present

## 2023-02-04 DIAGNOSIS — M5412 Radiculopathy, cervical region: Secondary | ICD-10-CM | POA: Diagnosis not present

## 2023-02-04 DIAGNOSIS — G894 Chronic pain syndrome: Secondary | ICD-10-CM | POA: Diagnosis not present

## 2023-02-04 NOTE — Progress Notes (Signed)
Contacted via MyChart   Good afternoon Andrew Cobb, your labs have returned: - Kidney function, creatinine and eGFR, remains normal. - CBC shows mildly low platelet count still which we will continue to monitor closely.  Any questions? Keep being amazing!!  Thank you for allowing me to participate in your care.  I appreciate you. Kindest regards, Rionna Feltes

## 2023-02-04 NOTE — Patient Outreach (Signed)
  Care Coordination   Follow Up Visit Note   02/04/2023 Name: Andrew Cobb MRN: [id number] DOB: 1951/03/02  Andrew Cobb is a 72 y.o. year old male who sees [name], [name], NP for primary care. I spoke with  [name] by phone today.  What matters to the patients health and wellness today?  Decrease A1C down to goal, less than 7     Goals Addressed             This Visit's Progress    Effective management of DM       Care Coordination Interventions: Provided education to patient about basic DM disease process Reviewed medications with patient and discussed importance of medication adherence Counseled on importance of regular laboratory monitoring as prescribed Provided patient with written educational materials related to hypo and hyperglycemia and importance of correct treatment Advised patient, providing education and rationale, to check cbg daily and record, calling provider for findings outside established parameters          SDOH assessments and interventions completed:  No     Care Coordination Interventions:  Yes, provided   Interventions Today    Flowsheet Row Most Recent Value  Chronic Disease   Chronic disease during today's visit Diabetes  General Interventions   General Interventions Discussed/Reviewed Labs, General Interventions Reviewed, Doctor Visits  Labs Hgb A1c every 3 months  [current A1C 7.4]  Doctor Visits Discussed/Reviewed Doctor Visits Reviewed, Specialist, PCP  [Neurology 10/30, PCP 10/31]  PCP/Specialist Visits Compliance with follow-up visit  Education Interventions   Education Provided Provided Education  Provided Verbal Education On Blood Sugar Monitoring, Medication, Nutrition  [Blood sugar range 110s-130s]  Nutrition Interventions   Nutrition Discussed/Reviewed Nutrition Reviewed, Adding fruits and vegetables, Carbohydrate meal planning, Decreasing sugar intake       Follow up plan: Follow up call scheduled for  11/1    Encounter Outcome:  Pt. Visit Completed   [name], RN,  MSN, [facility] Care Management Care Management Coordinator [phone]

## 2023-02-05 ENCOUNTER — Encounter: Payer: Self-pay | Admitting: Nurse Practitioner

## 2023-02-05 DIAGNOSIS — H35721 Serous detachment of retinal pigment epithelium, right eye: Secondary | ICD-10-CM | POA: Diagnosis not present

## 2023-02-05 DIAGNOSIS — E119 Type 2 diabetes mellitus without complications: Secondary | ICD-10-CM | POA: Diagnosis not present

## 2023-02-05 DIAGNOSIS — H43821 Vitreomacular adhesion, right eye: Secondary | ICD-10-CM | POA: Diagnosis not present

## 2023-02-05 DIAGNOSIS — H353122 Nonexudative age-related macular degeneration, left eye, intermediate dry stage: Secondary | ICD-10-CM | POA: Diagnosis not present

## 2023-02-05 DIAGNOSIS — H353211 Exudative age-related macular degeneration, right eye, with active choroidal neovascularization: Secondary | ICD-10-CM | POA: Diagnosis not present

## 2023-02-05 LAB — HM DIABETES EYE EXAM

## 2023-02-25 DIAGNOSIS — J189 Pneumonia, unspecified organism: Secondary | ICD-10-CM | POA: Diagnosis not present

## 2023-02-25 DIAGNOSIS — I2510 Atherosclerotic heart disease of native coronary artery without angina pectoris: Secondary | ICD-10-CM | POA: Diagnosis not present

## 2023-02-25 DIAGNOSIS — G4733 Obstructive sleep apnea (adult) (pediatric): Secondary | ICD-10-CM | POA: Diagnosis not present

## 2023-03-06 DIAGNOSIS — G894 Chronic pain syndrome: Secondary | ICD-10-CM | POA: Diagnosis not present

## 2023-03-06 DIAGNOSIS — M5412 Radiculopathy, cervical region: Secondary | ICD-10-CM | POA: Diagnosis not present

## 2023-03-06 DIAGNOSIS — Z981 Arthrodesis status: Secondary | ICD-10-CM | POA: Diagnosis not present

## 2023-03-16 ENCOUNTER — Other Ambulatory Visit: Payer: Self-pay | Admitting: Nurse Practitioner

## 2023-03-16 DIAGNOSIS — R569 Unspecified convulsions: Secondary | ICD-10-CM

## 2023-03-16 DIAGNOSIS — Z8546 Personal history of malignant neoplasm of prostate: Secondary | ICD-10-CM

## 2023-03-16 DIAGNOSIS — E119 Type 2 diabetes mellitus without complications: Secondary | ICD-10-CM

## 2023-03-16 MED ORDER — TELMISARTAN 80 MG PO TABS: 80.0000 mg | ORAL_TABLET | Freq: Every day | ORAL | 4 refills | Status: DC

## 2023-03-16 MED ORDER — ONETOUCH ULTRA VI STRP
ORAL_STRIP | 4 refills | Status: DC
Start: 2023-03-16 — End: 2024-03-21

## 2023-03-16 MED ORDER — FLUTICASONE PROPIONATE 50 MCG/ACT NA SUSP: 2.0000 | Freq: Every day | NASAL | 4 refills | Status: DC

## 2023-03-16 MED ORDER — TAMSULOSIN HCL 0.4 MG PO CAPS
0.4000 mg | ORAL_CAPSULE | Freq: Every day | ORAL | 4 refills | Status: DC
Start: 2023-03-16 — End: 2023-05-07

## 2023-03-16 MED ORDER — METFORMIN HCL 1000 MG PO TABS: 1000.0000 mg | ORAL_TABLET | Freq: Two times a day (BID) | ORAL | 4 refills | Status: DC

## 2023-03-16 MED ORDER — ACYCLOVIR 400 MG PO TABS: 400.0000 mg | ORAL_TABLET | Freq: Two times a day (BID) | ORAL | 4 refills | Status: DC

## 2023-03-16 MED ORDER — ONETOUCH ULTRASOFT LANCETS MISC: 8 refills | Status: DC

## 2023-03-16 MED ORDER — ATORVASTATIN CALCIUM 40 MG PO TABS: 40.0000 mg | ORAL_TABLET | Freq: Every day | ORAL | 4 refills | Status: DC

## 2023-03-16 MED ORDER — AMITRIPTYLINE HCL 25 MG PO TABS: 25.0000 mg | ORAL_TABLET | Freq: Every day | ORAL | 4 refills | Status: DC

## 2023-03-16 MED ORDER — ALBUTEROL SULFATE HFA 108 (90 BASE) MCG/ACT IN AERS: 2.0000 | INHALATION_SPRAY | Freq: Four times a day (QID) | RESPIRATORY_TRACT | 4 refills | Status: DC | PRN

## 2023-03-16 MED ORDER — LEVETIRACETAM 500 MG PO TABS
500.0000 mg | ORAL_TABLET | Freq: Two times a day (BID) | ORAL | 4 refills | Status: DC
Start: 2023-03-16 — End: 2024-02-17

## 2023-03-31 ENCOUNTER — Encounter: Payer: PPO | Admitting: Adult Health

## 2023-03-31 DIAGNOSIS — G4733 Obstructive sleep apnea (adult) (pediatric): Secondary | ICD-10-CM | POA: Diagnosis not present

## 2023-03-31 DIAGNOSIS — Z8546 Personal history of malignant neoplasm of prostate: Secondary | ICD-10-CM | POA: Diagnosis not present

## 2023-03-31 DIAGNOSIS — I10 Essential (primary) hypertension: Secondary | ICD-10-CM | POA: Diagnosis not present

## 2023-03-31 DIAGNOSIS — E1159 Type 2 diabetes mellitus with other circulatory complications: Secondary | ICD-10-CM | POA: Diagnosis not present

## 2023-03-31 DIAGNOSIS — D692 Other nonthrombocytopenic purpura: Secondary | ICD-10-CM | POA: Diagnosis not present

## 2023-03-31 DIAGNOSIS — Z7982 Long term (current) use of aspirin: Secondary | ICD-10-CM | POA: Diagnosis not present

## 2023-03-31 DIAGNOSIS — H353290 Exudative age-related macular degeneration, unspecified eye, stage unspecified: Secondary | ICD-10-CM | POA: Diagnosis not present

## 2023-03-31 DIAGNOSIS — I4819 Other persistent atrial fibrillation: Secondary | ICD-10-CM | POA: Diagnosis not present

## 2023-04-02 ENCOUNTER — Encounter: Payer: Self-pay | Admitting: Nurse Practitioner

## 2023-04-02 DIAGNOSIS — H353211 Exudative age-related macular degeneration, right eye, with active choroidal neovascularization: Secondary | ICD-10-CM | POA: Diagnosis not present

## 2023-04-02 DIAGNOSIS — H43821 Vitreomacular adhesion, right eye: Secondary | ICD-10-CM | POA: Diagnosis not present

## 2023-04-02 DIAGNOSIS — H35721 Serous detachment of retinal pigment epithelium, right eye: Secondary | ICD-10-CM | POA: Diagnosis not present

## 2023-04-02 DIAGNOSIS — E119 Type 2 diabetes mellitus without complications: Secondary | ICD-10-CM | POA: Diagnosis not present

## 2023-04-02 DIAGNOSIS — H353122 Nonexudative age-related macular degeneration, left eye, intermediate dry stage: Secondary | ICD-10-CM | POA: Diagnosis not present

## 2023-04-02 LAB — HM DIABETES EYE EXAM

## 2023-04-24 ENCOUNTER — Ambulatory Visit (INDEPENDENT_AMBULATORY_CARE_PROVIDER_SITE_OTHER): Payer: PPO

## 2023-04-24 DIAGNOSIS — Z23 Encounter for immunization: Secondary | ICD-10-CM

## 2023-04-28 ENCOUNTER — Telehealth: Payer: Self-pay | Admitting: Nurse Practitioner

## 2023-04-28 NOTE — Telephone Encounter (Signed)
Patient brought in Patient Assistance Forms to be completed by Aura Dials, NP. I am placing forms in providers folder. Informed patient to allow provider 48-72 hours to review/complete. Please call [phone] when completed.

## 2023-04-28 NOTE — Telephone Encounter (Signed)
Forms started. Placed in providers folder for completion and signature.

## 2023-04-29 NOTE — Telephone Encounter (Signed)
Called and LVM notifying patient that his paperwork was completed and ready to be picked up.

## 2023-05-03 NOTE — Patient Instructions (Signed)
Be Involved in Caring For Your Health:  Taking Medications When medications are taken as directed, they can greatly improve your health. But if they are not taken as prescribed, they may not work. In some cases, not taking them correctly can be harmful. To help ensure your treatment remains effective and safe, understand your medications and how to take them. Bring your medications to each visit for review by your provider.  Your lab results, notes, and after visit summary will be available on My Chart. We strongly encourage you to use this feature. If lab results are abnormal the clinic will contact you with the appropriate steps. If the clinic does not contact you assume the results are satisfactory. You can always view your results on My Chart. If you have questions regarding your health or results, please contact the clinic during office hours. You can also ask questions on My Chart.  We at [facility] are grateful that you chose [place] to provide your care. We strive to provide evidence-based and compassionate care and are always looking for feedback. If you get a survey from the clinic please complete this so we can hear your opinions.  Diabetes Mellitus and Foot Care Diabetes, also called diabetes mellitus, may cause problems with your feet and legs because of poor blood flow (circulation). Poor circulation may make your skin: Become thinner and drier. Break more easily. Heal more slowly. Peel and crack. You may also have nerve damage (neuropathy). This can cause decreased feeling in your legs and feet. This means that you may not notice minor injuries to your feet that could lead to more serious problems. Finding and treating problems early is the best way to prevent future foot problems. How to care for your feet Foot hygiene  Wash your feet daily with warm water and mild soap. Do not use hot water. Then, pat your feet and the areas between your toes until they are fully dry. Do  not soak your feet. This can dry your skin. Trim your toenails straight across. Do not dig under them or around the cuticle. File the edges of your nails with an emery board or nail file. Apply a moisturizing lotion or petroleum jelly to the skin on your feet and to dry, brittle toenails. Use lotion that does not contain alcohol and is unscented. Do not apply lotion between your toes. Shoes and socks Wear clean socks or stockings every day. Make sure they are not too tight. Do not wear knee-high stockings. These may decrease blood flow to your legs. Wear shoes that fit well and have enough cushioning. Always look in your shoes before you put them on to be sure there are no objects inside. To break in new shoes, wear them for just a few hours a day. This prevents injuries on your feet. Wounds, scrapes, corns, and calluses  Check your feet daily for blisters, cuts, bruises, sores, and redness. If you cannot see the bottom of your feet, use a mirror or ask someone for help. Do not cut off corns or calluses or try to remove them with medicine. If you find a minor scrape, cut, or break in the skin on your feet, keep it and the skin around it clean and dry. You may clean these areas with mild soap and water. Do not clean the area with peroxide, alcohol, or iodine. If you have a wound, scrape, corn, or callus on your foot, look at it several times a day to make sure it  is healing and not infected. Check for: Redness, swelling, or pain. Fluid or blood. Warmth. Pus or a bad smell. General tips Do not cross your legs. This may decrease blood flow to your feet. Do not use heating pads or hot water bottles on your feet. They may burn your skin. If you have lost feeling in your feet or legs, you may not know this is happening until it is too late. Protect your feet from hot and cold by wearing shoes, such as at the beach or on hot pavement. Schedule a complete foot exam at least once a year or more often if  you have foot problems. Report any cuts, sores, or bruises to your health care provider right away. Where to find more information American Diabetes Association: diabetes.org Association of Diabetes Care & Education Specialists: diabeteseducator.org Contact a health care provider if: You have a condition that increases your risk of infection, and you have any cuts, sores, or bruises on your feet. You have an injury that is not healing. You have redness on your legs or feet. You feel burning or tingling in your legs or feet. You have pain or cramps in your legs and feet. Your legs or feet are numb. Your feet always feel cold. You have pain around any toenails. Get help right away if: You have a wound, scrape, corn, or callus on your foot and: You have signs of infection. You have a fever. You have a red line going up your leg. This information is not intended to replace advice given to you by your health care provider. Make sure you discuss any questions you have with your health care provider. Document Revised: 12/25/2021 Document Reviewed: 12/25/2021 Elsevier Patient Education  2024 [employer].

## 2023-05-04 ENCOUNTER — Other Ambulatory Visit: Payer: PPO

## 2023-05-04 NOTE — Telephone Encounter (Signed)
The patients spouse called in requesting the information be faxed to [employer] as it states on the paperwork. She states she received a voice message on her phone that asks if the patient wants the information to be faxed or if they want to pick it up. They have requested the information be faxed please. Please assist patient further by faxing or if there is a problem let them know and they then can pick it up.

## 2023-05-05 NOTE — Telephone Encounter (Signed)
Information has been faxed as requested 
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
## 2023-05-06 ENCOUNTER — Telehealth: Payer: Self-pay | Admitting: Neurology

## 2023-05-06 ENCOUNTER — Ambulatory Visit: Payer: PPO | Admitting: Adult Health

## 2023-05-06 ENCOUNTER — Encounter: Payer: Self-pay | Admitting: Adult Health

## 2023-05-06 VITALS — BP 135/78 | HR 60 | Ht 70.0 in | Wt 205.0 lb

## 2023-05-06 DIAGNOSIS — I639 Cerebral infarction, unspecified: Secondary | ICD-10-CM

## 2023-05-06 DIAGNOSIS — I69398 Other sequelae of cerebral infarction: Secondary | ICD-10-CM

## 2023-05-06 DIAGNOSIS — G4733 Obstructive sleep apnea (adult) (pediatric): Secondary | ICD-10-CM | POA: Diagnosis not present

## 2023-05-06 DIAGNOSIS — R569 Unspecified convulsions: Secondary | ICD-10-CM | POA: Diagnosis not present

## 2023-05-06 NOTE — Patient Instructions (Addendum)
Your Plan:  Continue nightly use of CPAP for sleep apnea management for now  Will follow up with Dr. Frances Furbish to see if you would be a candidate for Inspire   Continue Keppra 500mg  twice daily for seizure prevention  Continue to follow with PCP for stroke risk factor management     Follow up in 1 year or call earlier if needed     Thank you for coming to see [place] at [facility]. I hope we have been able to provide you high quality care today.  You may receive a patient satisfaction survey over the next few weeks. We would appreciate your feedback and comments so that we may continue to improve ourselves and the health of our patients.

## 2023-05-06 NOTE — Telephone Encounter (Signed)
-----   Message from Ihor Austin sent at 05/06/2023  1:58 PM EDT ----- Please advise patient regarding Dr. Teofilo Pod recommendations regarding inspire device. Thank you! ----- Message ----- From: Huston Foley, MD Sent: 05/06/2023  12:15 PM EDT To: Ihor Austin, NP  Cleone Slim, Lurlean Horns is not indicated for patients who are tolerating CPAP and are compliant with it. Earnest Bailey is less effective compared to PAP therapy. As he has a complex Hx, including afib, on blood thinner, I would not recommend any surgery, unless it is absolutely necessary.  sa ----- Message ----- From: Ihor Austin, NP Sent: 05/06/2023   9:37 AM EDT To: Huston Foley, MD  Patient questions if he is a candidate for inspire device. Did discus benefits with CPAP vs Inspire, he doesn't have any difficulty tolerating CPAP but feels Inspire device would just be easier to use.  He does have C5 cervical nerve stimulator - unsure if this is a contraindication. Please let me know and I will notify patient. Thank you!

## 2023-05-06 NOTE — Telephone Encounter (Signed)
Called the patient and there was no answer. Left a detailed message advising of Dr Teofilo Pod recommendation. Instructed the patient to call back if he has further questions.

## 2023-05-07 ENCOUNTER — Encounter: Payer: Self-pay | Admitting: Nurse Practitioner

## 2023-05-07 ENCOUNTER — Ambulatory Visit (INDEPENDENT_AMBULATORY_CARE_PROVIDER_SITE_OTHER): Payer: PPO | Admitting: Nurse Practitioner

## 2023-05-07 VITALS — BP 116/74 | HR 71 | Temp 97.3°F | Ht 70.0 in | Wt 202.8 lb

## 2023-05-07 DIAGNOSIS — N401 Enlarged prostate with lower urinary tract symptoms: Secondary | ICD-10-CM | POA: Diagnosis not present

## 2023-05-07 DIAGNOSIS — Z8673 Personal history of transient ischemic attack (TIA), and cerebral infarction without residual deficits: Secondary | ICD-10-CM

## 2023-05-07 DIAGNOSIS — E1159 Type 2 diabetes mellitus with other circulatory complications: Secondary | ICD-10-CM | POA: Diagnosis not present

## 2023-05-07 DIAGNOSIS — D692 Other nonthrombocytopenic purpura: Secondary | ICD-10-CM

## 2023-05-07 DIAGNOSIS — E785 Hyperlipidemia, unspecified: Secondary | ICD-10-CM | POA: Diagnosis not present

## 2023-05-07 DIAGNOSIS — R569 Unspecified convulsions: Secondary | ICD-10-CM | POA: Diagnosis not present

## 2023-05-07 DIAGNOSIS — G4733 Obstructive sleep apnea (adult) (pediatric): Secondary | ICD-10-CM

## 2023-05-07 DIAGNOSIS — E1169 Type 2 diabetes mellitus with other specified complication: Secondary | ICD-10-CM | POA: Diagnosis not present

## 2023-05-07 DIAGNOSIS — E1165 Type 2 diabetes mellitus with hyperglycemia: Secondary | ICD-10-CM | POA: Diagnosis not present

## 2023-05-07 DIAGNOSIS — I152 Hypertension secondary to endocrine disorders: Secondary | ICD-10-CM

## 2023-05-07 DIAGNOSIS — I4819 Other persistent atrial fibrillation: Secondary | ICD-10-CM | POA: Diagnosis not present

## 2023-05-07 DIAGNOSIS — D696 Thrombocytopenia, unspecified: Secondary | ICD-10-CM

## 2023-05-07 LAB — BAYER DCA HB A1C WAIVED: HB A1C (BAYER DCA - WAIVED): 7 % — ABNORMAL HIGH (ref 4.8–5.6)

## 2023-05-07 MED ORDER — TAMSULOSIN HCL 0.4 MG PO CAPS: 0.8000 mg | ORAL_CAPSULE | Freq: Every day | ORAL | 4 refills | Status: DC

## 2023-05-07 NOTE — Assessment & Plan Note (Signed)
Chronic, ongoing with more urgency recently.  ?more OAB vs BPH.  Will trial increase in Flomax to 0.8 MG daily, but if no changes at next visit discussed with him we would trial a medication like Myrbetriq to see if more OAB issue.  Recommend he work on Kegel exercises at home for pelvic floor.

## 2023-05-07 NOTE — Assessment & Plan Note (Signed)
Chronic, stable.  Noted on past labs, check CBC every 6 months and monitor closely.  Send to hematology as needed.  CBC due at next visit.

## 2023-05-07 NOTE — Progress Notes (Signed)
BP 116/74 (BP Location: Left Arm, Patient Position: Sitting, Cuff Size: Normal)   Pulse 71   Temp (!) 97.3 F (36.3 C) (Oral)   Ht 5\' 10"  (1.778 m)   Wt 202 lb 12.8 oz (92 kg)   SpO2 96%   BMI 29.10 kg/m    Subjective:    Patient ID: Andrew Cobb, male    DOB: 03-26-1951, 72 y.o.   MRN: [id number]  HPI: Andrew Cobb is a 72 y.o. male  Chief Complaint  Patient presents with   Medical Management of Chronic Issues    3 month follow up, no new concerns today    DIABETES A1c 7.4% July.  He continues on Metformin 1000 MG BID and Jardiance 25 MG (obtains assistance with Jardiance).  Continues to go to [facility] and using pool for exercise Hypoglycemic episodes:no Polydipsia/polyuria: no Visual disturbance: no Chest pain: no Paresthesias: no Glucose Monitoring: yes  Accucheck frequency: Daily  Fasting glucose: 115- 135  Post prandial:  Evening:  Before meals: Taking Insulin?: no  Long acting insulin:  Short acting insulin: Blood Pressure Monitoring: daily Retinal Examination: Up to Date goes for injections Foot Exam: Up to Date Pneumovax: Up to Date Influenza: Up to Date Aspirin: no   HYPERTENSION / HYPERLIPIDEMIA Taking Telmisartan 80 MG daily and Atorvastatin 40 MG daily. Aortic atherosclerosis on imaging 07/11/20.  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 Had last visit with cardiology on 10/08/22.   Goes to see neurology due to history of stroke events x 3, last saw neurology 05/06/23 and they are looking in to [facility] for him due to OSA.  Currently uses CPAP. CVA caused some mild expressive aphasia.  Takes Keppra for history of seizure like activity post CVAs + Amitriptyline for headaches post CVA. Uses CPAP 100%. Satisfied with current treatment? yes Duration of hypertension: chronic BP monitoring frequency: daily BP range: 130/70 on average BP medication side effects: no Duration of hyperlipidemia: chronic Cholesterol medication side  effects: no Cholesterol supplements: none Medication compliance: good compliance Aspirin: no Recent stressors: no Recurrent headaches: no Visual changes: no Palpitations: no Dyspnea: no Chest pain: no Lower extremity edema: no Dizzy/lightheaded: no  The ASCVD Risk score (Arnett DK, et al., 2019) failed to calculate for the following reasons:   The patient has a prior MI or stroke diagnosis  ATRIAL FIBRILLATION Follows with cardiology.  Atrial fibrillation status: stable Satisfied with current treatment: yes  Medication side effects:  no Medication compliance: good compliance Etiology of atrial fibrillation:  Palpitations:  no Chest pain:  no Dyspnea on exertion:  no Orthopnea:  no Syncope:  no Edema:  no Ventricular rate control: Not indicated Anti-coagulation: not indicated   BPH Currently takes Tamsulosin, but having more symptoms. Would like to increase Tamsulosin. BPH status: uncontrolled Satisfied with current treatment?: yes Medication side effects: no Medication compliance: good compliance Duration: chronic Nocturia: 1/night Urinary frequency:yes if drinks too much water or tea Incomplete voiding: no Urgency: yes Weak urinary stream: yes Straining to start stream: no Dysuria: no Onset: gradual Severity: mild Alleviating factors: Tamsulosin Aggravating factors: drinking too much Treatments attempted: Tamsulosin IPSS Questionnaire (AUA-7): 8 Over the past month.   1)  How often have you had a sensation of not emptying your bladder completely after you finish urinating?  0 - Not at all  2)  How often have you had to urinate again less than two hours after you finished urinating? 4 - More than half the time  3)  How often have you found you stopped and started again several times when you urinated?  0 - Not at all  4) How difficult have you found it to postpone urination?  3 - About half the time  5) How often have you had a weak urinary stream?  0 - Not at  all  6) How often have you had to push or strain to begin urination?  0 - Not at all  7) How many times did you most typically get up to urinate from the time you went to bed until the time you got up in the morning?  1 - 1 time  Total score:  0-7 mildly symptomatic   8-19 moderately symptomatic   20-35 severely symptomatic      Relevant past medical, surgical, family and social history reviewed and updated as indicated. Interim medical history since our last visit reviewed. Allergies and medications reviewed and updated.  Review of Systems  Constitutional:  Negative for activity change, diaphoresis, fatigue and fever.  Respiratory:  Negative for cough, chest tightness, shortness of breath and wheezing.   Cardiovascular:  Negative for chest pain, palpitations and leg swelling.  Gastrointestinal: Negative.   Neurological: Negative.   Psychiatric/Behavioral: Negative.      Per HPI unless specifically indicated above     Objective:    BP 116/74 (BP Location: Left Arm, Patient Position: Sitting, Cuff Size: Normal)   Pulse 71   Temp (!) 97.3 F (36.3 C) (Oral)   Ht 5\' 10"  (1.778 m)   Wt 202 lb 12.8 oz (92 kg)   SpO2 96%   BMI 29.10 kg/m   Wt Readings from Last 3 Encounters:  05/07/23 202 lb 12.8 oz (92 kg)  05/06/23 205 lb (93 kg)  02/03/23 204 lb 9.6 oz (92.8 kg)    Physical Exam Vitals and nursing note reviewed.  Constitutional:      General: He is awake. He is not in acute distress.    Appearance: He is well-developed and well-groomed. He is not ill-appearing.  HENT:     Head: Normocephalic and atraumatic.     Right Ear: Hearing normal. No drainage.     Left Ear: Hearing normal. No drainage.  Eyes:     General: Lids are normal.        Right eye: No discharge.        Left eye: No discharge.     Conjunctiva/sclera: Conjunctivae normal.     Pupils: Pupils are equal, round, and reactive to light.  Neck:     Thyroid: No thyromegaly.     Vascular: No carotid bruit or  JVD.     Trachea: Trachea normal.  Cardiovascular:     Rate and Rhythm: Regular rhythm. Bradycardia present.     Heart sounds: Normal heart sounds, S1 normal and S2 normal. No murmur heard.    No gallop.  Pulmonary:     Effort: Pulmonary effort is normal. No accessory muscle usage or respiratory distress.     Breath sounds: Normal breath sounds.  Abdominal:     General: Bowel sounds are normal.     Palpations: Abdomen is soft. There is no hepatomegaly or splenomegaly.  Musculoskeletal:        General: Normal range of motion.     Cervical back: Normal range of motion and neck supple.     Right lower leg: No edema.     Left lower leg: No edema.  Skin:    General: Skin  is warm and dry.     Capillary Refill: Capillary refill takes less than 2 seconds.     Findings: Bruising present.     Comments: Bilateral bruises scattered to upper extremities.  Neurological:     Mental Status: He is alert and oriented to person, place, and time.     Deep Tendon Reflexes: Reflexes are normal and symmetric.  Psychiatric:        Attention and Perception: Attention normal.        Mood and Affect: Mood normal.        Speech: Speech normal.        Behavior: Behavior normal. Behavior is cooperative.        Thought Content: Thought content normal.    Results for orders placed or performed in visit on 04/02/23  HM DIABETES EYE EXAM  Result Value Ref Range   HM Diabetic Eye Exam No Retinopathy No Retinopathy      Assessment & Plan:   Problem List Items Addressed This Visit       Cardiovascular and Mediastinum   Hypertension associated with diabetes (HCC)    Chronic, ongoing with BP below neuro goal <130/80 in office and at home.  Recommend he monitor BP at home daily and document.  Will continue Telmisartan 80 MG daily only.  Continue focus on DASH diet and regular exercise at home.  LABS: CMP.  Continue collaboration with cardiology.  Recommend he wear compression hose at home to avoid syncopal  episodes and orthostatic BP.        Relevant Orders   Bayer DCA Hb A1c Waived   AMB Referral VBCI Care Management   Persistent atrial fibrillation (HCC)    Chronic, ongoing with cardioversion on 07/04/21 + Amulet procedure 07/18/22.  Continue collaboration with cardiology.  At this time continue ASA.      Relevant Orders   AMB Referral VBCI Care Management   Senile purpura (HCC)    Chronic.  Noted on exam, continue to monitor and recommend gentle skin care at home + notify provider if skin breakdown presents.        Respiratory   OSA (obstructive sleep apnea)    Chronic, stable with 100% use of CPAP.  Praised for this.  Neurology is looking into [place] for him.        Endocrine   Hyperlipidemia associated with type 2 diabetes mellitus (HCC)    Chronic, ongoing.  Continue tight lipid control, may need to increase Atorvastatin to 80 MG if ever elevation, recent LDL below goal for stroke prevention.  Continue current Atorvastatin dosing.  Lipid check today.      Relevant Orders   Bayer DCA Hb A1c Waived   Comprehensive metabolic panel   Lipid Panel w/o Chol/HDL Ratio   AMB Referral VBCI Care Management   Uncontrolled type 2 diabetes mellitus with hyperglycemia, without long-term current use of insulin (HCC) - Primary    Chronic, ongoing with A1c 7% today, trending down with reduction of ice cream  Urine ALB 80 January 2024.  Continue Telmisartan for kidney protection. Neuro goal <6.5%, recommend he take medication daily and focus heavily on diet -- as often is at goal, but recently has fallen off diet some.  Continue  Metformin 1000 MG BID and Jardiance 25 MG - obtains via assistance (new PharmD referral placed).  May need to consider GLP1 next visit if elevations continue -- discussed with him and he prefers to not add any medication at this time.  Recommend  he check BS twice daily + continue focus on regular exercise and diet.   - ARB on board + statin - Eye and foot exam up to  date. - Vaccinations up to date with exception of Shingrix.      Relevant Orders   Bayer DCA Hb A1c Waived   AMB Referral VBCI Care Management     Genitourinary   BPH (benign prostatic hyperplasia)    Chronic, ongoing with more urgency recently.  ?more OAB vs BPH.  Will trial increase in Flomax to 0.8 MG daily, but if no changes at next visit discussed with him we would trial a medication like Myrbetriq to see if more OAB issue.  Recommend he work on Kegel exercises at home for pelvic floor.      Relevant Medications   tamsulosin (FLOMAX) 0.4 MG CAPS capsule     Hematopoietic and Hemostatic   Thrombocytopenia (HCC)    Chronic, stable.  Noted on past labs, check CBC every 6 months and monitor closely.  Send to hematology as needed.  CBC due at next visit.      Relevant Orders   AMB Referral VBCI Care Management     Other   History of stroke    Continue collaboration with neurology due to significant stroke history.  Continue current medication regimen.  Discussed stroke prevention goals BP <130/80, LDL <55, A1c <6.5%.      Seizures (HCC)    Chronic, stable.  Being followed by neurology.  Continue current Keppra dose as prescribed by them and collaboration with neurology, reviewed notes.  Continue Amitriptyline for history of headaches post CVA.        Follow up plan: Return in about 3 months (around 08/07/2023) for Annual Exam.

## 2023-05-07 NOTE — Assessment & Plan Note (Signed)
Chronic, stable with 100% use of CPAP.  Praised for this.  Neurology is looking into [place] for him.

## 2023-05-07 NOTE — Assessment & Plan Note (Signed)
Chronic, ongoing with cardioversion on 07/04/21 + Amulet procedure 07/18/22.  Continue collaboration with cardiology.  At this time continue ASA.

## 2023-05-07 NOTE — Assessment & Plan Note (Signed)
Chronic.  Noted on exam, continue to monitor and recommend gentle skin care at home + notify provider if skin breakdown presents. 

## 2023-05-07 NOTE — Assessment & Plan Note (Signed)
Chronic, ongoing with BP below neuro goal <130/80 in office and at home.  Recommend he monitor BP at home daily and document.  Will continue Telmisartan 80 MG daily only.  Continue focus on DASH diet and regular exercise at home.  LABS: CMP.  Continue collaboration with cardiology.  Recommend he wear compression hose at home to avoid syncopal episodes and orthostatic BP.

## 2023-05-07 NOTE — Assessment & Plan Note (Signed)
Chronic, stable.  Being followed by neurology.  Continue current Keppra dose as prescribed by them and collaboration with neurology, reviewed notes.  Continue Amitriptyline for history of headaches post CVA. 

## 2023-05-07 NOTE — Assessment & Plan Note (Signed)
Chronic, ongoing.  Continue tight lipid control, may need to increase Atorvastatin to 80 MG if ever elevation, recent LDL below goal for stroke prevention.  Continue current Atorvastatin dosing.  Lipid check today. 

## 2023-05-07 NOTE — Assessment & Plan Note (Signed)
Chronic, ongoing with A1c 7% today, trending down with reduction of ice cream  Urine ALB 80 January 2024.  Continue Telmisartan for kidney protection. Neuro goal <6.5%, recommend he take medication daily and focus heavily on diet -- as often is at goal, but recently has fallen off diet some.  Continue  Metformin 1000 MG BID and Jardiance 25 MG - obtains via assistance (new PharmD referral placed).  May need to consider GLP1 next visit if elevations continue -- discussed with him and he prefers to not add any medication at this time.  Recommend he check BS twice daily + continue focus on regular exercise and diet.   - ARB on board + statin - Eye and foot exam up to date. - Vaccinations up to date with exception of Shingrix.

## 2023-05-07 NOTE — Assessment & Plan Note (Signed)
Continue collaboration with neurology due to significant stroke history.  Continue current medication regimen.  Discussed stroke prevention goals BP <130/80, LDL <55, A1c <6.5%.

## 2023-05-08 ENCOUNTER — Ambulatory Visit: Payer: Self-pay | Admitting: *Deleted

## 2023-05-08 LAB — COMPREHENSIVE METABOLIC PANEL
ALT: 49 [IU]/L — ABNORMAL HIGH (ref 0–44)
AST: 34 [IU]/L (ref 0–40)
Albumin: 4.5 g/dL (ref 3.8–4.8)
Alkaline Phosphatase: 86 [IU]/L (ref 44–121)
BUN/Creatinine Ratio: 21 (ref 10–24)
BUN: 30 mg/dL — ABNORMAL HIGH (ref 8–27)
Bilirubin Total: 0.8 mg/dL (ref 0.0–1.2)
CO2: 25 mmol/L (ref 20–29)
Calcium: 9.4 mg/dL (ref 8.6–10.2)
Chloride: 101 mmol/L (ref 96–106)
Creatinine, Ser: 1.41 mg/dL — ABNORMAL HIGH (ref 0.76–1.27)
Globulin, Total: 1.9 g/dL (ref 1.5–4.5)
Glucose: 127 mg/dL — ABNORMAL HIGH (ref 70–99)
Potassium: 4.3 mmol/L (ref 3.5–5.2)
Sodium: 141 mmol/L (ref 134–144)
Total Protein: 6.4 g/dL (ref 6.0–8.5)
eGFR: 53 mL/min/{1.73_m2} — ABNORMAL LOW (ref >59)

## 2023-05-08 LAB — LIPID PANEL W/O CHOL/HDL RATIO
Cholesterol, Total: 113 mg/dL (ref 100–199)
HDL: 53 mg/dL (ref >39)
LDL Chol Calc (NIH): 40 mg/dL (ref 0–99)
Triglycerides: 112 mg/dL (ref 0–149)
VLDL Cholesterol Cal: 20 mg/dL (ref 5–40)

## 2023-05-08 NOTE — Patient Outreach (Signed)
  Care Coordination   Follow Up Visit Note   05/08/2023 Name: Andrew Cobb MRN: [id number] DOB: 08-25-1950  Andrew Cobb is a 72 y.o. year old male who sees [name], [name], NP for primary care. I spoke with  [name] by phone today.  What matters to the patients health and wellness today?  Patient report he adjusted his diet (stopped eating ice cream) and A1C is now at goal.      Goals Addressed             This Visit's Progress    COMPLETED: Effective management of DM   On track    Care Coordination Interventions: Provided education to patient about basic DM disease process Reviewed medications with patient and discussed importance of medication adherence Counseled on importance of regular laboratory monitoring as prescribed Provided patient with written educational materials related to hypo and hyperglycemia and importance of correct treatment Advised patient, providing education and rationale, to check cbg daily and record, calling provider for findings outside established parameters          SDOH assessments and interventions completed:  No     Care Coordination Interventions:  Yes, provided   Interventions Today    Flowsheet Row Most Recent Value  Chronic Disease   Chronic disease during today's visit Diabetes  General Interventions   General Interventions Discussed/Reviewed Labs, Doctor Visits  Labs Hgb A1c every 3 months  [7.0]  Doctor Visits Discussed/Reviewed Doctor Visits Reviewed, Specialist  PCP/Specialist Visits Compliance with follow-up visit  Education Interventions   Education Provided Provided Education  Provided Verbal Education On Blood Sugar Monitoring, When to see the doctor, Labs, Eye Care, Foot Care  Labs Reviewed Hgb A1c  Nutrition Interventions   Nutrition Discussed/Reviewed Nutrition Reviewed, Decreasing fats, Carbohydrate meal planning, Decreasing sugar intake       Follow up plan: No further intervention required.    Encounter Outcome:  Patient Visit Completed   [name] RN, MSN, CCM [place]  [facility], [facility] RN Care Coordinator Direct Dial: [phone] / Main [phone] Fax [phone] Email: [email or web address]: [email or web address]

## 2023-05-08 NOTE — Patient Outreach (Signed)
  Care Coordination   05/08/2023 Name: Andrew Cobb MRN: [id number] DOB: Dec 01, 1950   Care Coordination Outreach Attempts:  An unsuccessful telephone outreach was attempted for a scheduled appointment today.  Follow Up Plan:  Additional outreach attempts will be made to offer the patient care coordination information and services.   Encounter Outcome:  No Answer   Care Coordination Interventions:  No, not indicated    [name] RN, MSN, CCM [facility], [facility] RN Care Coordinator Direct Dial: [phone] / Main [phone] Fax [phone] Email: [email or web address]: [email or web address]

## 2023-05-08 NOTE — Progress Notes (Signed)
Contacted via MyChart   Good afternoon Andrew Cobb!!  First off you are already SOLD OUT at [facility].  I may have to try to sneak up for a picture in the after professional photo slot time and get my hubby to take a photo of the kids and I with you:)  Best Nadene Rubins ever!!  As for labs they have returned: - Kidney function is showing some mild decline again, creatinine and eGFR.  Please ensure to get plenty of water daily, add a little lemon to this.  Liver function shows mild elevation in ALT we can monitor, AST is normal. - Cholesterol levels are well at goal.  Any questions? Keep being amazing!!  Thank you for allowing me to participate in your care.  I appreciate you. Kindest regards, Jenalee Trevizo

## 2023-05-21 ENCOUNTER — Encounter: Payer: Self-pay | Admitting: Nurse Practitioner

## 2023-05-21 DIAGNOSIS — H353122 Nonexudative age-related macular degeneration, left eye, intermediate dry stage: Secondary | ICD-10-CM | POA: Diagnosis not present

## 2023-05-21 DIAGNOSIS — H35721 Serous detachment of retinal pigment epithelium, right eye: Secondary | ICD-10-CM | POA: Diagnosis not present

## 2023-05-21 DIAGNOSIS — E119 Type 2 diabetes mellitus without complications: Secondary | ICD-10-CM | POA: Diagnosis not present

## 2023-05-21 DIAGNOSIS — H43821 Vitreomacular adhesion, right eye: Secondary | ICD-10-CM | POA: Diagnosis not present

## 2023-05-21 DIAGNOSIS — H353211 Exudative age-related macular degeneration, right eye, with active choroidal neovascularization: Secondary | ICD-10-CM | POA: Diagnosis not present

## 2023-06-17 ENCOUNTER — Telehealth (INDEPENDENT_AMBULATORY_CARE_PROVIDER_SITE_OTHER): Payer: PPO | Admitting: Family Medicine

## 2023-06-17 ENCOUNTER — Encounter: Payer: Self-pay | Admitting: Family Medicine

## 2023-06-17 ENCOUNTER — Ambulatory Visit: Payer: Self-pay

## 2023-06-17 VITALS — BP 120/75

## 2023-06-17 DIAGNOSIS — Z87891 Personal history of nicotine dependence: Secondary | ICD-10-CM | POA: Diagnosis not present

## 2023-06-17 DIAGNOSIS — J4521 Mild intermittent asthma with (acute) exacerbation: Secondary | ICD-10-CM

## 2023-06-17 DIAGNOSIS — J45909 Unspecified asthma, uncomplicated: Secondary | ICD-10-CM | POA: Diagnosis not present

## 2023-06-17 MED ORDER — AZITHROMYCIN 250 MG PO TABS: ORAL_TABLET | ORAL | 0 refills | Status: AC

## 2023-06-17 MED ORDER — PREDNISONE 10 MG PO TABS: ORAL_TABLET | ORAL | 0 refills | Status: DC

## 2023-06-17 NOTE — Progress Notes (Signed)
Patient presented to clinic for a nurse visit after earlier [facility] appointment. Per provider order Flu, Covid and Step testing all completed. Patient name and DOB confirmed and with permission all testing performed without complications. Patient understands when results are available he will be contacted.

## 2023-06-17 NOTE — Progress Notes (Signed)
BP 120/75   SpO2 97%    Subjective:    Patient ID: Andrew Cobb, male    DOB: Mar 01, 1951, 72 y.o.   MRN: [id number]  HPI: Andrew Cobb is a 72 y.o. male  Chief Complaint  Patient presents with   URI    Patient states he has been having a cough, congestion, sinus pressure, and hoarseness for the last 2 days.    UPPER RESPIRATORY TRACT INFECTION Duration: 2 days Worst symptom: congestion and cough Fever: yes Cough: yes Shortness of breath: yes Wheezing: yes Chest pain: no Chest tightness: no Chest congestion: yes Nasal congestion: yes Runny nose: yes Post nasal drip: no Sneezing: yes Sore throat: yes Swollen glands: no Sinus pressure: yes Headache: no Face pain: no Toothache: no Ear pain: no  Ear pressure: no  Eyes red/itching:no Eye drainage/crusting: no  Vomiting: no Rash: no Fatigue: yes Sick contacts: yes Strep contacts: no  Context: stable Recurrent sinusitis: no Relief with OTC cold/cough medications: no  Treatments attempted: inhaler, advil   Relevant past medical, surgical, family and social history reviewed and updated as indicated. Interim medical history since our last visit reviewed. Allergies and medications reviewed and updated.  Review of Systems  Constitutional:  Positive for fever. Negative for activity change, appetite change, chills, diaphoresis, fatigue and unexpected weight change.  HENT:  Positive for congestion, postnasal drip, rhinorrhea and sinus pressure. Negative for dental problem, drooling, ear discharge, ear pain, facial swelling, hearing loss, mouth sores, nosebleeds, sinus pain, sneezing, sore throat, tinnitus, trouble swallowing and voice change.   Respiratory:  Positive for cough, shortness of breath and wheezing. Negative for apnea, choking, chest tightness and stridor.   Cardiovascular: Negative.   Gastrointestinal: Negative.   Skin: Negative.   Psychiatric/Behavioral: Negative.      Per HPI unless specifically  indicated above     Objective:    BP 120/75   SpO2 97%   Wt Readings from Last 3 Encounters:  05/07/23 202 lb 12.8 oz (92 kg)  05/06/23 205 lb (93 kg)  02/03/23 204 lb 9.6 oz (92.8 kg)    Physical Exam Constitutional:      General: He is not in acute distress.    Appearance: Normal appearance. He is well-developed. He is obese. He is not ill-appearing, toxic-appearing or diaphoretic.  HENT:     Head: Normocephalic and atraumatic.     Right Ear: Hearing and external ear normal.     Left Ear: Hearing and external ear normal.     Nose: Nose normal.  Eyes:     General: Lids are normal. No scleral icterus.       Right eye: No discharge.        Left eye: No discharge.     Extraocular Movements: Extraocular movements intact.     Conjunctiva/sclera: Conjunctivae normal.     Pupils: Pupils are equal, round, and reactive to light.  Pulmonary:     Effort: Pulmonary effort is normal. No respiratory distress.  Musculoskeletal:        General: Normal range of motion.     Cervical back: Normal range of motion.  Skin:    Coloration: Skin is not jaundiced or pale.     Findings: No bruising, erythema, lesion or rash.  Neurological:     General: No focal deficit present.     Mental Status: He is alert and oriented to person, place, and time.  Psychiatric:        Mood and Affect: Mood  normal.        Speech: Speech normal.        Behavior: Behavior normal.        Thought Content: Thought content normal.        Judgment: Judgment normal.     Results for orders placed or performed in visit on 05/07/23  Bayer DCA Hb A1c Waived  Result Value Ref Range   HB A1C (BAYER DCA - WAIVED) 7.0 (H) 4.8 - 5.6 %  Comprehensive metabolic panel  Result Value Ref Range   Glucose 127 (H) 70 - 99 mg/dL   BUN 30 (H) 8 - 27 mg/dL   Creatinine, Ser 1.61 (H) 0.76 - 1.27 mg/dL   eGFR 53 (L) >09 UE/AVW/0.98   BUN/Creatinine Ratio 21 10 - 24   Sodium 141 134 - 144 mmol/L   Potassium 4.3 3.5 - 5.2 mmol/L    Chloride 101 96 - 106 mmol/L   CO2 25 20 - 29 mmol/L   Calcium 9.4 8.6 - 10.2 mg/dL   Total Protein 6.4 6.0 - 8.5 g/dL   Albumin 4.5 3.8 - 4.8 g/dL   Globulin, Total 1.9 1.5 - 4.5 g/dL   Bilirubin Total 0.8 0.0 - 1.2 mg/dL   Alkaline Phosphatase 86 44 - 121 IU/L   AST 34 0 - 40 IU/L   ALT 49 (H) 0 - 44 IU/L  Lipid Panel w/o Chol/HDL Ratio  Result Value Ref Range   Cholesterol, Total 113 100 - 199 mg/dL   Triglycerides 119 0 - 149 mg/dL   HDL 53 >14 mg/dL   VLDL Cholesterol Cal 20 5 - 40 mg/dL   LDL Chol Calc (NIH) 40 0 - 99 mg/dL      Assessment & Plan:   Problem List Items Addressed This Visit       Respiratory   Asthma - Primary   Relevant Medications   predniSONE (DELTASONE) 10 MG tablet   Other Relevant Orders   Veritor Flu A/B Waived   Rapid Strep Screen ([facility] ONLY)   Novel Coronavirus, NAA ([facility])     Follow up plan: Return for As scheduled.    This visit was completed via video visit through MyChart due to the restrictions of the COVID-19 pandemic. All issues as above were discussed and addressed. Physical exam was done as above through visual confirmation on video through MyChart. If it was felt that the patient should be evaluated in the office, they were directed there. The patient verbally consented to this visit. Location of the patient: home Location of the provider: work Those involved with this call:  Provider: Olevia Perches, DO CMA: Wilhemena Durie, CMA Front Desk/Registration:  Servando Snare   Time spent on call:  15 minutes with patient face to face via video conference. More than 50% of this time was spent in counseling and coordination of care. 23 minutes total spent in review of patient's record and preparation of their chart.

## 2023-06-17 NOTE — Telephone Encounter (Signed)
Summary: congestion, cough, yellow phlegm   congestion, cough, yellow phlegm,  (plays [employer])       Chief Complaint: Cough with yellow mucus, congestion, mild SOB, sore throat. Symptoms: Above Frequency: Yesterday Pertinent Negatives: Patient denies fever Disposition: [] ED /[] Urgent Care (no appt availability in office) / [] Appointment(In office/virtual)/ []  [facility]/ [] Home Care/ [] Refused Recommended Disposition /[] [facility]/ []  Follow-up with PCP Additional Notes: Agrees with appointment. Reason for Disposition  [1] MILD difficulty breathing (e.g., minimal/no SOB at rest, SOB with walking, pulse <100) AND [2] still present when not coughing  Answer Assessment - Initial Assessment Questions 1. ONSET: "When did the cough begin?"      Yesterday 2. SEVERITY: "How bad is the cough today?"      Severe 3. SPUTUM: "Describe the color of your sputum" (none, dry cough; clear, white, yellow, green)     Yellow 4. HEMOPTYSIS: "Are you coughing up any blood?" If so ask: "How much?" (flecks, streaks, tablespoons, etc.)     No 5. DIFFICULTY BREATHING: "Are you having difficulty breathing?" If Yes, ask: "How bad is it?" (e.g., mild, moderate, severe)    - MILD: No SOB at rest, mild SOB with walking, speaks normally in sentences, can lie down, no retractions, pulse < 100.    - MODERATE: SOB at rest, SOB with minimal exertion and prefers to sit, cannot lie down flat, speaks in phrases, mild retractions, audible wheezing, pulse 100-120.    - SEVERE: Very SOB at rest, speaks in single words, struggling to breathe, sitting hunched forward, retractions, pulse > 120      Mild 6. FEVER: "Do you have a fever?" If Yes, ask: "What is your temperature, how was it measured, and when did it start?"     No 7. CARDIAC HISTORY: "Do you have any history of heart disease?" (e.g., heart attack, congestive heart failure)      No 8. LUNG HISTORY: "Do you have any history of lung  disease?"  (e.g., pulmonary embolus, asthma, emphysema)     No 9. PE RISK FACTORS: "Do you have a history of blood clots?" (or: recent major surgery, recent prolonged travel, bedridden)     No 10. OTHER SYMPTOMS: "Do you have any other symptoms?" (e.g., runny nose, wheezing, chest pain)       Runny nose, sore throat 11. PREGNANCY: "Is there any chance you are pregnant?" "When was your last menstrual period?"       N/A 12. TRAVEL: "Have you traveled out of the country in the last month?" (e.g., travel history, exposures)       nO  Protocols used: Cough - Acute Productive-A-AH

## 2023-06-19 LAB — NOVEL CORONAVIRUS, NAA: SARS-CoV-2, NAA: NOT DETECTED

## 2023-06-20 LAB — CULTURE, GROUP A STREP: Strep A Culture: NEGATIVE

## 2023-06-20 LAB — VERITOR FLU A/B WAIVED
Influenza A: NEGATIVE
Influenza B: NEGATIVE

## 2023-06-20 LAB — RAPID STREP SCREEN (MED CTR MEBANE ONLY): Strep Gp A Ag, IA W/Reflex: NEGATIVE

## 2023-07-09 ENCOUNTER — Inpatient Hospital Stay: Payer: PPO | Attending: Radiation Oncology

## 2023-07-09 DIAGNOSIS — Z8546 Personal history of malignant neoplasm of prostate: Secondary | ICD-10-CM | POA: Insufficient documentation

## 2023-07-15 ENCOUNTER — Inpatient Hospital Stay: Payer: PPO

## 2023-07-15 ENCOUNTER — Other Ambulatory Visit: Payer: Self-pay | Admitting: *Deleted

## 2023-07-15 ENCOUNTER — Ambulatory Visit
Admission: RE | Admit: 2023-07-15 | Discharge: 2023-07-15 | Disposition: A | Discharge status: Home / Self Care | Payer: PPO | Source: Ambulatory Visit | Attending: Radiation Oncology | Admitting: Radiation Oncology

## 2023-07-15 ENCOUNTER — Encounter: Payer: Self-pay | Admitting: Radiation Oncology

## 2023-07-15 VITALS — BP 116/72 | HR 71 | Temp 96.3°F | Resp 16 | Ht 70.0 in | Wt 202.8 lb

## 2023-07-15 DIAGNOSIS — C61 Malignant neoplasm of prostate: Secondary | ICD-10-CM

## 2023-07-15 DIAGNOSIS — Z8546 Personal history of malignant neoplasm of prostate: Secondary | ICD-10-CM | POA: Diagnosis not present

## 2023-07-15 DIAGNOSIS — Z191 Hormone sensitive malignancy status: Secondary | ICD-10-CM | POA: Diagnosis not present

## 2023-07-15 NOTE — Progress Notes (Signed)
 Radiation Oncology Follow up Note  Name: Andrew Cobb   Date:   07/15/2023 MRN:  [id number] DOB: Nov 10, 1950    This 73 y.o. male presents to the clinic today for 3-year follow-up status post.  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  His last PSA almost a year ago was 0.42.  He had a PSA drawn today.  COMPLICATIONS OF TREATMENT: none  FOLLOW UP COMPLIANCE: keeps appointments   PHYSICAL EXAM:  BP 116/72   Pulse 71   Temp (!) 96.3 F (35.7 C) (Tympanic)   Resp 16   Ht 5' 10 (1.778 m)   Wt 202 lb 12.8 oz (92 kg)   BMI 29.10 kg/m  Well-developed well-nourished patient in NAD. HEENT reveals PERLA, EOMI, discs not visualized.  Oral cavity is clear. No oral mucosal lesions are identified. Neck is clear without evidence of cervical or supraclavicular adenopathy. Lungs are clear to A&P. Cardiac examination is essentially unremarkable with regular rate and rhythm without murmur rub or thrill. Abdomen is benign with no organomegaly or masses noted. Motor sensory and DTR levels are equal and symmetric in the upper and lower extremities. Cranial nerves II through XII are grossly intact. Proprioception is intact. No peripheral adenopathy or edema is identified. No motor or sensory levels are noted. Crude visual fields are within normal range.  RADIOLOGY RESULTS: No current films for review  PLAN: Present time patient clinically is doing well.  Will await his new PSA.  I have asked to see him back in 1 year with another PSA prior to his visit.  Patient knows to call with any concerns.  I would like to take this opportunity to thank you  for allowing me to participate in the care of your patient.SABRA Marcey Penton, MD

## 2023-07-16 LAB — PSA: Prostatic Specific Antigen: 0.34 ng/mL (ref 0.00–4.00)

## 2023-07-20 DIAGNOSIS — E119 Type 2 diabetes mellitus without complications: Secondary | ICD-10-CM | POA: Diagnosis not present

## 2023-07-20 DIAGNOSIS — H353211 Exudative age-related macular degeneration, right eye, with active choroidal neovascularization: Secondary | ICD-10-CM | POA: Diagnosis not present

## 2023-07-20 DIAGNOSIS — H35721 Serous detachment of retinal pigment epithelium, right eye: Secondary | ICD-10-CM | POA: Diagnosis not present

## 2023-07-20 DIAGNOSIS — H353122 Nonexudative age-related macular degeneration, left eye, intermediate dry stage: Secondary | ICD-10-CM | POA: Diagnosis not present

## 2023-07-20 DIAGNOSIS — H43821 Vitreomacular adhesion, right eye: Secondary | ICD-10-CM | POA: Diagnosis not present

## 2023-07-20 LAB — HM DIABETES EYE EXAM

## 2023-07-28 ENCOUNTER — Other Ambulatory Visit: Payer: Self-pay

## 2023-07-28 MED ORDER — TAMSULOSIN HCL 0.4 MG PO CAPS: 0.8000 mg | ORAL_CAPSULE | Freq: Every day | ORAL | 4 refills | Status: DC

## 2023-07-29 ENCOUNTER — Encounter: Payer: Self-pay | Admitting: Sports Medicine

## 2023-08-07 ENCOUNTER — Other Ambulatory Visit: Payer: Self-pay

## 2023-08-07 MED ORDER — ALBUTEROL SULFATE HFA 108 (90 BASE) MCG/ACT IN AERS: 2.0000 | INHALATION_SPRAY | Freq: Four times a day (QID) | RESPIRATORY_TRACT | 4 refills | Status: DC | PRN

## 2023-08-08 DIAGNOSIS — E119 Type 2 diabetes mellitus without complications: Secondary | ICD-10-CM | POA: Insufficient documentation

## 2023-08-08 NOTE — Patient Instructions (Signed)
 Be Involved in Caring For Your Health:  Taking Medications When medications are taken as directed, they can greatly improve your health. But if they are not taken as prescribed, they may not work. In some cases, not taking them correctly can be harmful. To help ensure your treatment remains effective and safe, understand your medications and how to take them. Bring your medications to each visit for review by your provider.  Your lab results, notes, and after visit summary will be available on My Chart. We strongly encourage you to use this feature. If lab results are abnormal the clinic will contact you with the appropriate steps. If the clinic does not contact you assume the results are satisfactory. You can always view your results on My Chart. If you have questions regarding your health or results, please contact the clinic during office hours. You can also ask questions on My Chart.  We at [facility] are grateful that you chose [place] to provide your care. We strive to provide evidence-based and compassionate care and are always looking for feedback. If you get a survey from the clinic please complete this so we can hear your opinions.  Diabetes Mellitus and Foot Care Diabetes, also called diabetes mellitus, may cause problems with your feet and legs because of poor blood flow (circulation). Poor circulation may make your skin: Become thinner and drier. Break more easily. Heal more slowly. Peel and crack. You may also have nerve damage (neuropathy). This can cause decreased feeling in your legs and feet. This means that you may not notice minor injuries to your feet that could lead to more serious problems. Finding and treating problems early is the best way to prevent future foot problems. How to care for your feet Foot hygiene  Wash your feet daily with warm water and mild soap. Do not use hot water. Then, pat your feet and the areas between your toes until they are fully dry. Do  not soak your feet. This can dry your skin. Trim your toenails straight across. Do not dig under them or around the cuticle. File the edges of your nails with an emery board or nail file. Apply a moisturizing lotion or petroleum jelly to the skin on your feet and to dry, brittle toenails. Use lotion that does not contain alcohol and is unscented. Do not apply lotion between your toes. Shoes and socks Wear clean socks or stockings every day. Make sure they are not too tight. Do not wear knee-high stockings. These may decrease blood flow to your legs. Wear shoes that fit well and have enough cushioning. Always look in your shoes before you put them on to be sure there are no objects inside. To break in new shoes, wear them for just a few hours a day. This prevents injuries on your feet. Wounds, scrapes, corns, and calluses  Check your feet daily for blisters, cuts, bruises, sores, and redness. If you cannot see the bottom of your feet, use a mirror or ask someone for help. Do not cut off corns or calluses or try to remove them with medicine. If you find a minor scrape, cut, or break in the skin on your feet, keep it and the skin around it clean and dry. You may clean these areas with mild soap and water. Do not clean the area with peroxide, alcohol, or iodine. If you have a wound, scrape, corn, or callus on your foot, look at it several times a day to make sure it  is healing and not infected. Check for: Redness, swelling, or pain. Fluid or blood. Warmth. Pus or a bad smell. General tips Do not cross your legs. This may decrease blood flow to your feet. Do not use heating pads or hot water bottles on your feet. They may burn your skin. If you have lost feeling in your feet or legs, you may not know this is happening until it is too late. Protect your feet from hot and cold by wearing shoes, such as at the beach or on hot pavement. Schedule a complete foot exam at least once a year or more often if  you have foot problems. Report any cuts, sores, or bruises to your health care provider right away. Where to find more information American Diabetes Association: diabetes.org Association of Diabetes Care & Education Specialists: diabeteseducator.org Contact a health care provider if: You have a condition that increases your risk of infection, and you have any cuts, sores, or bruises on your feet. You have an injury that is not healing. You have redness on your legs or feet. You feel burning or tingling in your legs or feet. You have pain or cramps in your legs and feet. Your legs or feet are numb. Your feet always feel cold. You have pain around any toenails. Get help right away if: You have a wound, scrape, corn, or callus on your foot and: You have signs of infection. You have a fever. You have a red line going up your leg. This information is not intended to replace advice given to you by your health care provider. Make sure you discuss any questions you have with your health care provider. Document Revised: 12/25/2021 Document Reviewed: 12/25/2021 Elsevier Patient Education  2024 [employer].

## 2023-08-10 ENCOUNTER — Ambulatory Visit (INDEPENDENT_AMBULATORY_CARE_PROVIDER_SITE_OTHER): Payer: PPO | Admitting: Nurse Practitioner

## 2023-08-10 ENCOUNTER — Encounter: Payer: Self-pay | Admitting: Nurse Practitioner

## 2023-08-10 VITALS — BP 118/64 | HR 77 | Temp 97.9°F | Ht 69.4 in | Wt 204.0 lb

## 2023-08-10 DIAGNOSIS — Z8673 Personal history of transient ischemic attack (TIA), and cerebral infarction without residual deficits: Secondary | ICD-10-CM

## 2023-08-10 DIAGNOSIS — D696 Thrombocytopenia, unspecified: Secondary | ICD-10-CM | POA: Diagnosis not present

## 2023-08-10 DIAGNOSIS — D692 Other nonthrombocytopenic purpura: Secondary | ICD-10-CM

## 2023-08-10 DIAGNOSIS — Z7984 Long term (current) use of oral hypoglycemic drugs: Secondary | ICD-10-CM | POA: Diagnosis not present

## 2023-08-10 DIAGNOSIS — J4520 Mild intermittent asthma, uncomplicated: Secondary | ICD-10-CM

## 2023-08-10 DIAGNOSIS — I700 Atherosclerosis of aorta: Secondary | ICD-10-CM | POA: Diagnosis not present

## 2023-08-10 DIAGNOSIS — E1159 Type 2 diabetes mellitus with other circulatory complications: Secondary | ICD-10-CM

## 2023-08-10 DIAGNOSIS — R569 Unspecified convulsions: Secondary | ICD-10-CM

## 2023-08-10 DIAGNOSIS — E1169 Type 2 diabetes mellitus with other specified complication: Secondary | ICD-10-CM

## 2023-08-10 DIAGNOSIS — H353211 Exudative age-related macular degeneration, right eye, with active choroidal neovascularization: Secondary | ICD-10-CM

## 2023-08-10 DIAGNOSIS — E119 Type 2 diabetes mellitus without complications: Secondary | ICD-10-CM

## 2023-08-10 DIAGNOSIS — I152 Hypertension secondary to endocrine disorders: Secondary | ICD-10-CM

## 2023-08-10 DIAGNOSIS — Z0000 Encounter for general adult medical examination without abnormal findings: Secondary | ICD-10-CM

## 2023-08-10 DIAGNOSIS — E1165 Type 2 diabetes mellitus with hyperglycemia: Secondary | ICD-10-CM

## 2023-08-10 DIAGNOSIS — I4819 Other persistent atrial fibrillation: Secondary | ICD-10-CM

## 2023-08-10 DIAGNOSIS — E785 Hyperlipidemia, unspecified: Secondary | ICD-10-CM | POA: Diagnosis not present

## 2023-08-10 LAB — BAYER DCA HB A1C WAIVED: HB A1C (BAYER DCA - WAIVED): 7.5 % — ABNORMAL HIGH (ref 4.8–5.6)

## 2023-08-10 LAB — MICROALBUMIN, URINE WAIVED
Creatinine, Urine Waived: 200 mg/dL (ref 10–300)
Microalb, Ur Waived: 80 mg/L — ABNORMAL HIGH (ref 0–19)

## 2023-08-10 NOTE — Assessment & Plan Note (Signed)
Chronic.  Noted on exam, continue to monitor and recommend gentle skin care at home + notify provider if skin breakdown presents. 

## 2023-08-10 NOTE — Assessment & Plan Note (Signed)
Chronic, stable.  Noted on past labs, check CBC every 6 months and monitor closely.  Send to hematology as needed.  CBC today, next in July 2025.

## 2023-08-10 NOTE — Assessment & Plan Note (Signed)
Continue collaboration with neurology due to significant stroke history.  Continue current medication regimen.  Discussed stroke prevention goals BP <130/80, LDL <55, A1c <6.5%.

## 2023-08-10 NOTE — Progress Notes (Addendum)
BP 118/64 (BP Location: Left Arm, Patient Position: Sitting, Cuff Size: Normal)   Pulse 77   Temp 97.9 F (36.6 C) (Oral)   Ht 5' 9.4" (1.763 m)   Wt 204 lb (92.5 kg)   SpO2 92%   BMI 29.78 kg/m    Subjective:    Patient ID: Andrew Cobb, male    DOB: [birth year], 73 y.o.   MRN: [id number]  HPI: Andrew Cobb is a 73 y.o. male presenting on 08/10/2023 for comprehensive medical examination. Current medical complaints include:none  He currently lives with: wife Interim Problems from his last visit: no  DIABETES 7% A1c in October.  Continues on Metformin 1000 MG BID and Jardiance 25 MG (obtains assistance with Jardiance). Goes to [facility] often and exercises.  Did eat more sweets over the holidays. Hypoglycemic episodes:no Polydipsia/polyuria: no Visual disturbance: no Chest pain: no Paresthesias: no Glucose Monitoring: yes  Accucheck frequency: Daily  Fasting glucose: 110 to 125 range  Post prandial:  Evening:  Before meals: Taking Insulin?: no  Long acting insulin:  Short acting insulin: Blood Pressure Monitoring: daily Retinal Examination: Up to Date, has injections Foot Exam: Up to Date Pneumovax: Up to Date Influenza: Up to Date Aspirin: no   HYPERTENSION / HYPERLIPIDEMIA Continues Telmisartan 80 MG daily and Atorvastatin 40 MG daily. Aortic atherosclerosis on imaging 07/11/20.  Surgery on 07/18/22 for amulet left atrial appendage. Last visit with cardiology was on 10/08/22 - has visit once a year.   Visits with neurology due to history of stroke events x 3, last saw neurology 05/06/23, they have discussed Inspire for OSA.  Currently uses CPAP. CVA caused some mild expressive aphasia.  Takes Keppra for history of seizure like activity post CVAs + Amitriptyline for headaches post CVA.  Satisfied with current treatment? yes Duration of hypertension: chronic BP monitoring frequency: daily BP range: on average 120-130/70-80 BP medication side effects: no Duration of  hyperlipidemia: chronic Cholesterol medication side effects: no Cholesterol supplements: none Medication compliance: good compliance Aspirin: no Recent stressors: no Recurrent headaches: no Visual changes: no Palpitations: no Dyspnea: no Chest pain: no Lower extremity edema: no Dizzy/lightheaded: no  The ASCVD Risk score (Arnett DK, et al., 2019) failed to calculate for the following reasons:   Risk score cannot be calculated because patient has a medical history suggesting prior/existing ASCVD  ATRIAL FIBRILLATION Follows with cardiology.  Atrial fibrillation status: stable Satisfied with current treatment: yes  Medication side effects:  no Medication compliance: good compliance Etiology of atrial fibrillation:  Palpitations:  no Chest pain:  no Dyspnea on exertion:  no Orthopnea:  no Syncope:  no Edema:  no Ventricular rate control: Not indicated Anti-coagulation: not indicated   ASTHMA Has Albuterol as needed. Asthma status: stable Satisfied with current treatment?: yes Albuterol/rescue inhaler frequency: once a day Dyspnea frequency: none Wheezing frequency: occasional Cough frequency: occasional Nocturnal symptom frequency: none Limitation of activity: no Current upper respiratory symptoms: no Triggers: pollen Home peak flows: none Last Spirometry: unknown Failed/intolerant to following asthma meds:  Asthma meds in past:  Aerochamber/spacer use: no Visits to ER or Urgent Care in past year: no Pneumovax: Up to Date Influenza: Up to Date   Functional Status Survey: Is the patient deaf or have difficulty hearing?: No Does the patient have difficulty seeing, even when wearing glasses/contacts?: No Does the patient have difficulty concentrating, remembering, or making decisions?: No Does the patient have difficulty walking or climbing stairs?: No Does the patient have difficulty dressing or bathing?:  No Does the patient have difficulty doing errands alone  such as visiting a doctor's office or shopping?: No  FALL RISK:    10/27/2022   11:40 AM 11/01/2021    9:11 AM 07/24/2021   10:34 AM 05/06/2021    2:10 PM 07/20/2020   10:33 AM  Fall Risk   Falls in the past year? 0 1 0 0 1  Comment     tripped  Number falls in past yr: 0 1 0 0 0  Injury with Fall? 0 1 0 0 0  Risk for fall due to : No Fall Risks History of fall(s)  No Fall Risks History of fall(s);Medication side effect  Follow up Falls prevention discussed;Falls evaluation completed Falls evaluation completed Falls evaluation completed;Falls prevention discussed Falls evaluation completed Falls evaluation completed;Education provided;Falls prevention discussed   Depression Screen    10/27/2022   11:37 AM 11/01/2021    9:11 AM 07/24/2021   10:42 AM 05/06/2021    2:10 PM 07/20/2020   10:34 AM  Depression screen PHQ 2/9  Decreased Interest 0 0 0 0 0  Down, Depressed, Hopeless 0 0 0 0 0  PHQ - 2 Score 0 0 0 0 0  Altered sleeping 0 0  0   Tired, decreased energy 0 0  0   Change in appetite 0 0  0   Feeling bad or failure about yourself  0 0  0   Trouble concentrating 0 0  0   Moving slowly or fidgety/restless 0 0  0   Suicidal thoughts 0 0  0   PHQ-9 Score 0 0  0   Difficult doing work/chores Not difficult at all Not difficult at all  Not difficult at all       11/01/2021    9:12 AM  GAD 7 : Generalized Anxiety Score  Nervous, Anxious, on Edge 0  Control/stop worrying 0  Worry too much - different things 0  Trouble relaxing 0  Restless 0  Easily annoyed or irritable 0  Afraid - awful might happen 0  Total GAD 7 Score 0  Anxiety Difficulty Not difficult at all   Functional Status Survey: Is the patient deaf or have difficulty hearing?: No Does the patient have difficulty seeing, even when wearing glasses/contacts?: No Does the patient have difficulty concentrating, remembering, or making decisions?: No Does the patient have difficulty walking or climbing stairs?: No Does  the patient have difficulty dressing or bathing?: No Does the patient have difficulty doing errands alone such as visiting a doctor's office or shopping?: No   Past Medical History:  Past Medical History:  Diagnosis Date   Arthritis    Asthma    has not needed since 6/21   Cancer [facility])    prostate   Chronic venous insufficiency    varicose vein lower extremity with inflammation   Coronary artery disease 1996   two stents placed    Diabetes mellitus without complication (HCC)    type 2 on metformin   GERD (gastroesophageal reflux disease)    no issues since gastric bypass surgery as stated per pt   Hyperlipidemia    Hypertension    Hypogonadism in male    MRSA (methicillin resistant Staphylococcus aureus) infection    07/30/2008 thru 08/07/2008  abdominal abcess   Sleep apnea    on BIPAP   Stented coronary artery    Stroke (HCC) 09/07/2019   no defecits   Thrombocythemia     Surgical History:  Past  Surgical History:  Procedure Laterality Date   ANGIOPLASTY     ANGIOPLASTY     with stent 04/07/1995   ANTERIOR CERVICAL DECOMPRESSION/DISCECTOMY FUSION 4 LEVELS N/A 08/17/2017   Procedure: Anterior discectomy with fusion and plate fixation Cervical Three-Four, Four-Five, Five-Six, and Six-Seven Fusion;  Surgeon: Ditty, Loura Halt, MD;  Location: [facility] OR;  Service: Neurosurgery;  Laterality: N/A;  Anterior discectomy with fusion and plate fixation Cervical Three-Four, Four-Five, Five-Six, and Six-Seven Fusion    APPENDECTOMY     1966   BUBBLE STUDY  09/20/2018   Procedure: BUBBLE STUDY;  Surgeon: Wendall Stade, MD;  Location: [facility] ENDOSCOPY;  Service: Cardiovascular;;   CARDIAC CATHETERIZATION  1996   CARDIOVASCULAR STRESS TEST     07/31/2011   CARDIOVERSION N/A 07/04/2021   Procedure: CARDIOVERSION;  Surgeon: Chrystie Nose, MD;  Location: [facility] ENDOSCOPY;  Service: Cardiovascular;  Laterality: N/A;   CARPAL TUNNEL RELEASE Left    CHOLECYSTECTOMY     2006   COLONOSCOPY WITH  PROPOFOL N/A 03/26/2021   Procedure: COLONOSCOPY WITH PROPOFOL;  Surgeon: Wyline Mood, MD;  Location: [facility] ENDOSCOPY;  Service: Gastroenterology;  Laterality: N/A;   colonscopy      08/25/2012   EYE SURGERY Bilateral    cataract   FUNCTIONAL ENDOSCOPIC SINUS SURGERY     11/10/2013   GASTRIC BYPASS     10/05/2012   HERNIA REPAIR     left inguinal 1981   INCISION AND DRAINAGE ABSCESS Right 02/26/2017   Procedure: INCISION AND DRAINAGE ABSCESS;  Surgeon: Hildred Laser, MD;  Location: [facility] ORS;  Service: Urology;  Laterality: Right;   JOINT REPLACEMENT     bilateral   left ankle surgery      05/03/2003    LEFT ATRIAL APPENDAGE OCCLUSION N/A 03/06/2022   Procedure: LEFT ATRIAL APPENDAGE OCCLUSION;  Surgeon: Lanier Prude, MD;  Location: [facility] INVASIVE CV LAB;  Service: Cardiovascular;  Laterality: N/A;   left carpel tunnel      09/18/1993   left knee meniscal tear      01/25/2010   left knee meniscal tear repair      05/04/1996   left rotator cuff repair      05/03/2003    LOOP RECORDER INSERTION N/A 09/20/2018   Procedure: LOOP RECORDER INSERTION;  Surgeon: Marinus Maw, MD;  Location: [facility] INVASIVE CV LAB;  Service: Cardiovascular;  Laterality: N/A;   REPLACEMENT TOTAL KNEE BILATERAL  07/13/2015   REVERSE SHOULDER ARTHROPLASTY Left 08/30/2020   Procedure: REVERSE SHOULDER ARTHROPLASTY;  Surgeon: Francena Hanly, MD;  Location: [facility] ORS;  Service: Orthopedics;  Laterality: Left;    right ankle surgery      fracture has 2 screws 07/07/1997   right carpel tunnel      05/16/1992   right shoulder replacement      01/27/2006   SCROTAL EXPLORATION Right 02/26/2017   Procedure: SCROTUM EXPLORATION;  Surgeon: Hildred Laser, MD;  Location: [facility] ORS;  Service: Urology;  Laterality: Right;   TEE WITHOUT CARDIOVERSION N/A 09/20/2018   Procedure: TRANSESOPHAGEAL ECHOCARDIOGRAM (TEE);  Surgeon: Wendall Stade, MD;  Location: [facility] ENDOSCOPY;  Service: Cardiovascular;  Laterality: N/A;   TEE  WITHOUT CARDIOVERSION N/A 03/06/2022   Procedure: TRANSESOPHAGEAL ECHOCARDIOGRAM (TEE);  Surgeon: Lanier Prude, MD;  Location: [facility] INVASIVE CV LAB;  Service: Cardiovascular;  Laterality: N/A;   TOTAL KNEE ARTHROPLASTY Left 07/13/2015   Procedure: LEFT TOTAL KNEE ARTHROPLASTY;  Surgeon: Ollen Gross, MD;  Location: [facility] ORS;  Service: Orthopedics;  Laterality: Left;    Medications:  Current Outpatient Medications on File Prior to Visit  Medication Sig   acyclovir (ZOVIRAX) 400 MG tablet Take 1 tablet (400 mg total) by mouth 2 (two) times daily.   albuterol (VENTOLIN HFA) 108 (90 Base) MCG/ACT inhaler Inhale 2 puffs into the lungs every 6 (six) hours as needed for wheezing or shortness of breath.   amitriptyline (ELAVIL) 25 MG tablet Take 1 tablet (25 mg total) by mouth at bedtime.   atorvastatin (LIPITOR) 40 MG tablet Take 1 tablet (40 mg total) by mouth at bedtime.   cetirizine (ZYRTEC) 10 MG tablet Take 10 mg by mouth daily.   Cholecalciferol (VITAMIN D) 2000 units CAPS Take 2,000 Units by mouth daily.   clobetasol cream (TEMOVATE) 0.05 % Apply 1 Application topically 2 (two) times daily.   Cyanocobalamin (B-12) 5000 MCG CAPS Take 5,000 mcg by mouth 2 (two) times a week.   empagliflozin (JARDIANCE) 25 MG TABS tablet Take 1 tablet (25 mg total) by mouth daily.   fluticasone (FLONASE) 50 MCG/ACT nasal spray Place 2 sprays into both nostrils daily.   glucose blood (ONETOUCH ULTRA) test strip USE TO CHECK GLUCOSE TWICE DAILY. USE AS INSTRUCTED   Lancets (ONETOUCH ULTRASOFT) lancets Use as instructed   levETIRAcetam (KEPPRA) 500 MG tablet Take 1 tablet (500 mg total) by mouth 2 (two) times daily.   metFORMIN (GLUCOPHAGE) 1000 MG tablet Take 1 tablet (1,000 mg total) by mouth 2 (two) times daily with a meal.   Multiple Vitamins-Minerals (MULTIVITAMIN PO) Take 1 tablet by mouth daily.    tamsulosin (FLOMAX) 0.4 MG CAPS capsule Take 2 capsules (0.8 mg total) by mouth daily after supper. (Patient  taking differently: Take 0.4 mg by mouth daily after supper.)   telmisartan (MICARDIS) 80 MG tablet Take 1 tablet (80 mg total) by mouth daily.   Current Facility-Administered Medications on File Prior to Visit  Medication   0.9 %  sodium chloride infusion   0.9 %  sodium chloride infusion    Allergies:  Allergies  Allergen Reactions   Bactrim [Sulfamethoxazole-Trimethoprim] Rash   Succinylsulphathiazole Rash   Tetracyclines & Related Rash    Social History:  Social History   Socioeconomic History   Marital status: Married    Spouse name: Not on file   Number of children: Not on file   Years of education: Not on file   Highest education level: Some college, no degree  Occupational History    Comment: drives for [employer]   Tobacco Use   Smoking status: Former    Current packs/day: 0.00    Average packs/day: 1 pack/day for 10.0 years (10.0 ttl pk-yrs)    Types: Cigarettes    Start date: 07/07/1974    Quit date: 07/07/1984    Years since quitting: 39.1   Smokeless tobacco: Never   Tobacco comments:    Former Smoker quit 1986 07/23/2021  Vaping Use   Vaping status: Never Used  Substance and Sexual Activity   Alcohol use: No    Alcohol/week: 0.0 standard drinks of alcohol   Drug use: No   Sexual activity: Yes  Other Topics Concern   Not on file  Social History Narrative   Not on file   Social Drivers of Health   Financial Resource Strain: Low Risk  (11/03/2022)   Overall Financial Resource Strain (CARDIA)    Difficulty of Paying Living Expenses: Not hard at all  Food Insecurity: No Food Insecurity (10/27/2022)   Hunger Vital Sign  Worried About Programme researcher, broadcasting/film/video in the Last Year: Never true    Ran Out of Food in the Last Year: Never true  Transportation Needs: No Transportation Needs (11/03/2022)   PRAPARE - Administrator, Civil Service (Medical): No    Lack of Transportation (Non-Medical): No  Physical Activity: Sufficiently Active (10/27/2022)    Exercise Vital Sign    Days of Exercise per Week: 5 days    Minutes of Exercise per Session: 60 min  Stress: No Stress Concern Present (10/27/2022)   [employer] of Occupational Health - Occupational Stress Questionnaire    Feeling of Stress : Not at all  Social Connections: Moderately Integrated (10/27/2022)   Social Connection and Isolation Panel [NHANES]    Frequency of Communication with Friends and Family: Three times a week    Frequency of Social Gatherings with Friends and Family: Never    Attends Religious Services: More than 4 times per year    Active Member of [employer] or Organizations: No    Attends Banker Meetings: Never    Marital Status: Married  Catering manager Violence: Not At Risk (10/27/2022)   Humiliation, Afraid, Rape, and Kick questionnaire    Fear of Current or Ex-Partner: No    Emotionally Abused: No    Physically Abused: No    Sexually Abused: No   Social History   Tobacco Use  Smoking Status Former   Current packs/day: 0.00   Average packs/day: 1 pack/day for 10.0 years (10.0 ttl pk-yrs)   Types: Cigarettes   Start date: 07/07/1974   Quit date: 07/07/1984   Years since quitting: 39.1  Smokeless Tobacco Never  Tobacco Comments   Former Smoker quit 1986 07/23/2021   Social History   Substance and Sexual Activity  Alcohol Use No   Alcohol/week: 0.0 standard drinks of alcohol    Family History:  Family History  Problem Relation Age of Onset   Cancer Mother        pancreatic   Diabetes Mother    Stroke Mother    Heart disease Mother    Hyperlipidemia Mother    Hypertension Mother    Heart attack Mother    Heart disease Father    Stroke Father    Diabetes Father    Hypertension Father    Heart attack Father    Hyperlipidemia Father    Pancreatic cancer Father    Cancer Sister    Cancer Brother        lung   Cancer Brother    Kidney cancer Neg Hx    Bladder Cancer Neg Hx    Prostate cancer Neg Hx     Past medical  history, surgical history, medications, allergies, family history and social history reviewed with patient today and changes made to appropriate areas of the chart.   ROS All other ROS negative except what is listed above and in the HPI.      Objective:    BP 118/64 (BP Location: Left Arm, Patient Position: Sitting, Cuff Size: Normal)   Pulse 77   Temp 97.9 F (36.6 C) (Oral)   Ht 5' 9.4" (1.763 m)   Wt 204 lb (92.5 kg)   SpO2 92%   BMI 29.78 kg/m   Wt Readings from Last 3 Encounters:  08/10/23 204 lb (92.5 kg)  07/15/23 202 lb 12.8 oz (92 kg)  05/07/23 202 lb 12.8 oz (92 kg)    Physical Exam Vitals and nursing note reviewed.  Constitutional:  General: He is awake. He is not in acute distress.    Appearance: He is well-developed and well-groomed. He is not ill-appearing or toxic-appearing.  HENT:     Head: Normocephalic and atraumatic.     Right Ear: Hearing, tympanic membrane, ear canal and external ear normal. No drainage.     Left Ear: Hearing, tympanic membrane, ear canal and external ear normal. No drainage.     Nose: Nose normal.     Mouth/Throat:     Pharynx: Uvula midline.  Eyes:     General: Lids are normal.        Right eye: No discharge.        Left eye: No discharge.     Extraocular Movements: Extraocular movements intact.     Conjunctiva/sclera: Conjunctivae normal.     Pupils: Pupils are equal, round, and reactive to light.     Visual Fields: Right eye visual fields normal and left eye visual fields normal.  Neck:     Thyroid: No thyromegaly.     Vascular: No carotid bruit or JVD.     Trachea: Trachea normal.  Cardiovascular:     Rate and Rhythm: Normal rate and regular rhythm.     Heart sounds: Normal heart sounds, S1 normal and S2 normal. No murmur heard.    No gallop.  Pulmonary:     Effort: Pulmonary effort is normal. No accessory muscle usage or respiratory distress.     Breath sounds: Normal breath sounds.  Abdominal:     General: Bowel  sounds are normal.     Palpations: Abdomen is soft. There is no hepatomegaly or splenomegaly.     Tenderness: There is no abdominal tenderness.  Musculoskeletal:        General: Normal range of motion.     Cervical back: Normal range of motion and neck supple.     Right lower leg: No edema.     Left lower leg: No edema.  Lymphadenopathy:     Head:     Right side of head: No submental, submandibular, tonsillar, preauricular or posterior auricular adenopathy.     Left side of head: No submental, submandibular, tonsillar, preauricular or posterior auricular adenopathy.     Cervical: No cervical adenopathy.  Skin:    General: Skin is warm and dry.     Capillary Refill: Capillary refill takes less than 2 seconds.     Findings: Bruising present. No rash.     Comments: Scattered bruises upper extremities  Neurological:     Mental Status: He is alert and oriented to person, place, and time.     Gait: Gait is intact.     Deep Tendon Reflexes: Reflexes are normal and symmetric.     Reflex Scores:      Brachioradialis reflexes are 2+ on the right side and 2+ on the left side.      Patellar reflexes are 2+ on the right side and 2+ on the left side. Psychiatric:        Attention and Perception: Attention normal.        Mood and Affect: Mood normal.        Speech: Speech normal.        Behavior: Behavior normal. Behavior is cooperative.        Thought Content: Thought content normal.        Cognition and Memory: Cognition normal.        10/27/2022   11:46 AM 07/20/2020   10:35 AM 04/22/2018  9:10 AM 04/20/2017    8:14 AM  6CIT Screen  What Year? 0 points 0 points 0 points 0 points  What month? 0 points 0 points 0 points 0 points  What time? 0 points 0 points 0 points 0 points  Count back from 20 0 points 0 points 0 points 0 points  Months in reverse 0 points 4 points 0 points   Repeat phrase 0 points 6 points 0 points 0 points  Total Score 0 points 10 points 0 points    Results  for orders placed or performed in visit on 07/20/23  HM DIABETES EYE EXAM   Collection Time: 07/20/23  3:12 PM  Result Value Ref Range   HM Diabetic Eye Exam No Retinopathy No Retinopathy      Assessment & Plan:   Problem List Items Addressed This Visit       Cardiovascular and Mediastinum   Aortic atherosclerosis (HCC)   Chronic.  Noted on imaging 07/11/20 and previous imaging.  Recommend continue daily statin therapy and ASA for prevention.      Exudative age-related macular degeneration of right eye with active choroidal neovascularization (HCC)   Chronic, ongoing, followed by opthalmology.  Continue this collaboration.      Hypertension associated with diabetes (HCC)   Chronic, ongoing with BP well below neuro goal <130/80 in office and at home.  Recommend he monitor BP at home daily and document.  Will continue Telmisartan 80 MG daily only.  Continue focus on DASH diet and regular exercise at home.  LABS: CMP, CBC, urine ALB.  Continue collaboration with cardiology.  Recommend he wear compression hose at home to avoid syncopal episodes and orthostatic BP.        Relevant Orders   Bayer DCA Hb A1c Waived   Microalbumin, Urine Waived   Comprehensive metabolic panel   Persistent atrial fibrillation (HCC)   Chronic, ongoing with cardioversion on 07/04/21 + Amulet procedure 07/18/22.  Continue collaboration with cardiology.  At this time continue ASA.      Senile purpura (HCC)   Chronic.  Noted on exam, continue to monitor and recommend gentle skin care at home + notify provider if skin breakdown presents.        Respiratory   Asthma   Chronic, ongoing.  Stable at this time.  Continue current medication regimen and collaboration with pulmonary as needed.        Endocrine   Diabetes mellitus treated with oral medication (HCC)   Refer to diabetes with hyperglycemia plan of care.      Relevant Orders   Bayer DCA Hb A1c Waived   Microalbumin, Urine Waived   Hyperlipidemia  associated with type 2 diabetes mellitus (HCC)   Chronic, ongoing.  Continue tight lipid control, may need to increase Atorvastatin to 80 MG if ever elevation, recent LDL below goal for stroke prevention.  Continue current Atorvastatin dosing.  Lipid check today.      Relevant Orders   Bayer DCA Hb A1c Waived   Comprehensive metabolic panel   Lipid Panel w/o Chol/HDL Ratio   Uncontrolled type 2 diabetes mellitus with hyperglycemia, without long-term current use of insulin (HCC) - Primary   Chronic, ongoing with A1c 7.5% today, trending up due to poor diet over the holidays. Urine ALB 80 February 2025. Continue Telmisartan for kidney protection. Neuro goal <6.5%, recommend he take medication daily and focus heavily on diet -- as often is at goal, but recently has fallen off diet routine.  Continue  Metformin 1000 MG BID and Jardiance 25 MG - obtains via assistance (continue to work with PharmD).  May need to consider GLP1 next visit if elevations continue -- discussed with him and he prefers to not add any medication at this time.  Recommend he check BS twice daily + continue focus on regular exercise and diet.   - ARB on board + statin - Eye and foot exam up to date. - Vaccinations up to date with exception of Shingrix.      Relevant Orders   Bayer DCA Hb A1c Waived   Microalbumin, Urine Waived     Hematopoietic and Hemostatic   Thrombocytopenia (HCC)   Chronic, stable.  Noted on past labs, check CBC every 6 months and monitor closely.  Send to hematology as needed.  CBC today, next in July 2025.      Relevant Orders   CBC with Differential/Platelet     Other   History of stroke   Continue collaboration with neurology due to significant stroke history.  Continue current medication regimen.  Discussed stroke prevention goals BP <130/80, LDL <55, A1c <6.5%.      Seizures (HCC)   Chronic, stable.  Being followed by neurology.  Continue current Keppra dose as prescribed by them and  collaboration with neurology, reviewed notes.  Continue Amitriptyline for history of headaches post CVA.      Other Visit Diagnoses       Encounter for annual physical exam       Annual physical today, health maintenance reviewed with patient.       Discussed aspirin prophylaxis for myocardial infarction prevention and decision was continued.  LABORATORY TESTING:  Health maintenance labs ordered today as discussed above.   The natural history of prostate cancer and ongoing controversy regarding screening and potential treatment outcomes of prostate cancer has been discussed with the patient. The meaning of a false positive PSA and a false negative PSA has been discussed. He indicates understanding of the limitations of this screening test and wishes to proceed with screening PSA testing at next visit.   IMMUNIZATIONS:   - Tetanus vaccination status reviewed: last tetanus booster within 10 years. - Influenza: Up to date - Pneumovax: Up to date - Prevnar: Up to date - Zostavax vaccine: Refused  SCREENING: - Colonoscopy: Up to date  Discussed with patient purpose of the colonoscopy is to detect colon cancer at curable precancerous or early stages   - AAA Screening: Not applicable  -Hearing Test: Not applicable  -Spirometry: Not applicable   PATIENT COUNSELING:    Sexuality: Discussed sexually transmitted diseases, partner selection, use of condoms, avoidance of unintended pregnancy  and contraceptive alternatives.   Advised to avoid cigarette smoking.  I discussed with the patient that most people either abstain from alcohol or drink within safe limits (<=14/week and <=4 drinks/occasion for males, <=7/weeks and <= 3 drinks/occasion for females) and that the risk for alcohol disorders and other health effects rises proportionally with the number of drinks per week and how often a drinker exceeds daily limits.  Discussed cessation/primary prevention of drug use and availability  of treatment for abuse.   Diet: Encouraged to adjust caloric intake to maintain  or achieve ideal body weight, to reduce intake of dietary saturated fat and total fat, to limit sodium intake by avoiding high sodium foods and not adding table salt, and to maintain adequate dietary potassium and calcium preferably from fresh fruits, vegetables, and low-fat dairy products.    Stressed  the importance of regular exercise  Injury prevention: Discussed safety belts, safety helmets, smoke detector, smoking near bedding or upholstery.   Dental health: Discussed importance of regular tooth brushing, flossing, and dental visits.   Follow up plan: NEXT PREVENTATIVE PHYSICAL DUE IN 1 YEAR. Return in about 3 months (around 11/07/2023) for T2DM, HTN/HLD.

## 2023-08-10 NOTE — Assessment & Plan Note (Signed)
Chronic.  Noted on imaging 07/11/20 and previous imaging.  Recommend continue daily statin therapy and ASA for prevention. 

## 2023-08-10 NOTE — Assessment & Plan Note (Signed)
Chronic, ongoing, followed by opthalmology.  Continue this collaboration. 

## 2023-08-10 NOTE — Assessment & Plan Note (Signed)
Chronic, ongoing.  Stable at this time.  Continue current medication regimen and collaboration with pulmonary as needed.

## 2023-08-10 NOTE — Assessment & Plan Note (Signed)
Refer to diabetes with hyperglycemia plan of care.

## 2023-08-10 NOTE — Assessment & Plan Note (Signed)
Chronic, ongoing with cardioversion on 07/04/21 + Amulet procedure 07/18/22.  Continue collaboration with cardiology.  At this time continue ASA.

## 2023-08-10 NOTE — Assessment & Plan Note (Signed)
Chronic, ongoing with BP well below neuro goal <130/80 in office and at home.  Recommend he monitor BP at home daily and document.  Will continue Telmisartan 80 MG daily only.  Continue focus on DASH diet and regular exercise at home.  LABS: CMP, CBC, urine ALB.  Continue collaboration with cardiology.  Recommend he wear compression hose at home to avoid syncopal episodes and orthostatic BP.

## 2023-08-10 NOTE — Assessment & Plan Note (Signed)
Chronic, ongoing.  Continue tight lipid control, may need to increase Atorvastatin to 80 MG if ever elevation, recent LDL below goal for stroke prevention.  Continue current Atorvastatin dosing.  Lipid check today. 

## 2023-08-10 NOTE — Assessment & Plan Note (Signed)
Chronic, stable.  Being followed by neurology.  Continue current Keppra dose as prescribed by them and collaboration with neurology, reviewed notes.  Continue Amitriptyline for history of headaches post CVA. 

## 2023-08-10 NOTE — Assessment & Plan Note (Signed)
Chronic, ongoing with A1c 7.5% today, trending up due to poor diet over the holidays. Urine ALB 80 February 2025. Continue Telmisartan for kidney protection. Neuro goal <6.5%, recommend he take medication daily and focus heavily on diet -- as often is at goal, but recently has fallen off diet routine.  Continue  Metformin 1000 MG BID and Jardiance 25 MG - obtains via assistance (continue to work with PharmD).  May need to consider GLP1 next visit if elevations continue -- discussed with him and he prefers to not add any medication at this time.  Recommend he check BS twice daily + continue focus on regular exercise and diet.   - ARB on board + statin - Eye and foot exam up to date. - Vaccinations up to date with exception of Shingrix.

## 2023-08-11 ENCOUNTER — Encounter: Payer: Self-pay | Admitting: Nurse Practitioner

## 2023-08-11 LAB — CBC WITH DIFFERENTIAL/PLATELET
Basophils Absolute: 0 10*3/uL (ref 0.0–0.2)
Basos: 1 %
EOS (ABSOLUTE): 0.1 10*3/uL (ref 0.0–0.4)
Eos: 3 %
Hematocrit: 41.2 % (ref 37.5–51.0)
Hemoglobin: 13.5 g/dL (ref 13.0–17.7)
Immature Grans (Abs): 0 10*3/uL (ref 0.0–0.1)
Immature Granulocytes: 0 %
Lymphocytes Absolute: 0.9 10*3/uL (ref 0.7–3.1)
Lymphs: 21 %
MCH: 31.8 pg (ref 26.6–33.0)
MCHC: 32.8 g/dL (ref 31.5–35.7)
MCV: 97 fL (ref 79–97)
Monocytes Absolute: 0.4 10*3/uL (ref 0.1–0.9)
Monocytes: 9 %
Neutrophils Absolute: 3 10*3/uL (ref 1.4–7.0)
Neutrophils: 66 %
Platelets: 146 10*3/uL — ABNORMAL LOW (ref 150–450)
RBC: 4.25 x10E6/uL (ref 4.14–5.80)
RDW: 12 % (ref 11.6–15.4)
WBC: 4.5 10*3/uL (ref 3.4–10.8)

## 2023-08-11 LAB — COMPREHENSIVE METABOLIC PANEL WITH GFR
ALT: 44 [IU]/L (ref 0–44)
AST: 34 [IU]/L (ref 0–40)
Albumin: 4.3 g/dL (ref 3.8–4.8)
Alkaline Phosphatase: 80 [IU]/L (ref 44–121)
BUN/Creatinine Ratio: 22 (ref 10–24)
BUN: 31 mg/dL — ABNORMAL HIGH (ref 8–27)
Bilirubin Total: 0.5 mg/dL (ref 0.0–1.2)
CO2: 21 mmol/L (ref 20–29)
Calcium: 9.4 mg/dL (ref 8.6–10.2)
Chloride: 101 mmol/L (ref 96–106)
Creatinine, Ser: 1.43 mg/dL — ABNORMAL HIGH (ref 0.76–1.27)
Globulin, Total: 2.1 g/dL (ref 1.5–4.5)
Glucose: 142 mg/dL — ABNORMAL HIGH (ref 70–99)
Potassium: 4.6 mmol/L (ref 3.5–5.2)
Sodium: 140 mmol/L (ref 134–144)
Total Protein: 6.4 g/dL (ref 6.0–8.5)
eGFR: 52 mL/min/{1.73_m2} — ABNORMAL LOW

## 2023-08-11 LAB — LIPID PANEL W/O CHOL/HDL RATIO
Cholesterol, Total: 123 mg/dL (ref 100–199)
HDL: 60 mg/dL
LDL Chol Calc (NIH): 44 mg/dL (ref 0–99)
Triglycerides: 102 mg/dL (ref 0–149)
VLDL Cholesterol Cal: 19 mg/dL (ref 5–40)

## 2023-08-11 NOTE — Progress Notes (Signed)
Contacted via MyChart   Good afternoon Romolo, your labs have returned.   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  Liver function, AST and ALT, is normal. - CBC shows no anemia or infection. Platelet count still a little low.  We will monitor this closely to ensure no bleeding. - Lipid panel shows at goal levels. Keep being amazing!!  Thank you for allowing me to participate in your care.  I appreciate you. Kindest regards, Esli Jernigan

## 2023-08-25 DIAGNOSIS — H35721 Serous detachment of retinal pigment epithelium, right eye: Secondary | ICD-10-CM | POA: Diagnosis not present

## 2023-08-25 DIAGNOSIS — H353122 Nonexudative age-related macular degeneration, left eye, intermediate dry stage: Secondary | ICD-10-CM | POA: Diagnosis not present

## 2023-08-25 DIAGNOSIS — E119 Type 2 diabetes mellitus without complications: Secondary | ICD-10-CM | POA: Diagnosis not present

## 2023-08-25 DIAGNOSIS — H43821 Vitreomacular adhesion, right eye: Secondary | ICD-10-CM | POA: Diagnosis not present

## 2023-08-25 DIAGNOSIS — H353211 Exudative age-related macular degeneration, right eye, with active choroidal neovascularization: Secondary | ICD-10-CM | POA: Diagnosis not present

## 2023-09-07 ENCOUNTER — Telehealth: Payer: Self-pay | Admitting: Nurse Practitioner

## 2023-09-07 ENCOUNTER — Encounter: Payer: Self-pay | Admitting: Nurse Practitioner

## 2023-09-07 MED ORDER — ALBUTEROL SULFATE HFA 108 (90 BASE) MCG/ACT IN AERS: 2.0000 | INHALATION_SPRAY | Freq: Four times a day (QID) | RESPIRATORY_TRACT | 4 refills | Status: DC | PRN

## 2023-09-07 NOTE — Telephone Encounter (Signed)
 Patients wife came in the office and stated that he need a refill on his Albuterol Inhaler. Please advise.

## 2023-09-21 ENCOUNTER — Telehealth: Payer: Self-pay | Admitting: Nurse Practitioner

## 2023-09-21 NOTE — Telephone Encounter (Signed)
 Patient dropped off document  Department [employer] Form , to be filled out by provider. Patient requested to send it back via Call Patient to pick up within 5-days. Document is located in providers folder. Please advise at Mobile [phone] (mobile)

## 2023-09-22 NOTE — Telephone Encounter (Signed)
 Paperwork started. Given to provider to complete and sign off on.

## 2023-09-24 NOTE — Telephone Encounter (Signed)
 Called and LVM notifying patient that his forms are ready to be picked up. Also notified him of Jolene's message regarding his CPAP compliance report.

## 2023-09-29 DIAGNOSIS — H353211 Exudative age-related macular degeneration, right eye, with active choroidal neovascularization: Secondary | ICD-10-CM | POA: Diagnosis not present

## 2023-09-29 DIAGNOSIS — H43821 Vitreomacular adhesion, right eye: Secondary | ICD-10-CM | POA: Diagnosis not present

## 2023-09-29 DIAGNOSIS — E119 Type 2 diabetes mellitus without complications: Secondary | ICD-10-CM | POA: Diagnosis not present

## 2023-09-29 DIAGNOSIS — H35721 Serous detachment of retinal pigment epithelium, right eye: Secondary | ICD-10-CM | POA: Diagnosis not present

## 2023-09-29 DIAGNOSIS — H353122 Nonexudative age-related macular degeneration, left eye, intermediate dry stage: Secondary | ICD-10-CM | POA: Diagnosis not present

## 2023-11-05 DIAGNOSIS — H35721 Serous detachment of retinal pigment epithelium, right eye: Secondary | ICD-10-CM | POA: Diagnosis not present

## 2023-11-05 DIAGNOSIS — H02836 Dermatochalasis of left eye, unspecified eyelid: Secondary | ICD-10-CM | POA: Diagnosis not present

## 2023-11-05 DIAGNOSIS — H43821 Vitreomacular adhesion, right eye: Secondary | ICD-10-CM | POA: Diagnosis not present

## 2023-11-05 DIAGNOSIS — H353122 Nonexudative age-related macular degeneration, left eye, intermediate dry stage: Secondary | ICD-10-CM | POA: Diagnosis not present

## 2023-11-05 DIAGNOSIS — E119 Type 2 diabetes mellitus without complications: Secondary | ICD-10-CM | POA: Diagnosis not present

## 2023-11-05 DIAGNOSIS — H353211 Exudative age-related macular degeneration, right eye, with active choroidal neovascularization: Secondary | ICD-10-CM | POA: Diagnosis not present

## 2023-11-05 DIAGNOSIS — H02833 Dermatochalasis of right eye, unspecified eyelid: Secondary | ICD-10-CM | POA: Diagnosis not present

## 2023-11-08 NOTE — Patient Instructions (Signed)
Be Involved in Caring For Your Health:  Taking Medications When medications are taken as directed, they can greatly improve your health. But if they are not taken as prescribed, they may not work. In some cases, not taking them correctly can be harmful. To help ensure your treatment remains effective and safe, understand your medications and how to take them. Bring your medications to each visit for review by your provider.  Your lab results, notes, and after visit summary will be available on My Chart. We strongly encourage you to use this feature. If lab results are abnormal the clinic will contact you with the appropriate steps. If the clinic does not contact you assume the results are satisfactory. You can always view your results on My Chart. If you have questions regarding your health or results, please contact the clinic during office hours. You can also ask questions on My Chart.  We at [facility] are grateful that you chose [place] to provide your care. We strive to provide evidence-based and compassionate care and are always looking for feedback. If you get a survey from the clinic please complete this so we can hear your opinions.  Diabetes Mellitus and Exercise Regular exercise is important for your health, especially if you have diabetes mellitus. Exercise is not just about losing weight. It can also help you increase muscle strength and bone density and reduce body fat and stress. This can help your level of endurance and make you more fit and flexible. Why should I exercise if I have diabetes? Exercise has many benefits for people with diabetes. It can: Help lower and control your blood sugar (glucose). Help your body respond better and become more sensitive to the hormone insulin. Reduce how much insulin your body needs. Lower your risk for heart disease by: Lowering how much "bad" cholesterol and triglycerides you have in your body. Increasing how much "good" cholesterol  you have in your body. Lowering your blood pressure. Lowering your blood glucose levels. What is my activity plan? Your health care provider or an expert trained in diabetes care (certified diabetes educator) can help you make an activity plan. This plan can help you find the type of exercise that works for you. It may also tell you how often to exercise and for how long. Be sure to: Get at least 150 minutes of medium-intensity or high-intensity exercise each week. This may involve brisk walking, biking, or water aerobics. Do stretching and strengthening exercises at least 2 times a week. This may involve yoga or weight lifting. Spread out your activity over at least 3 days of the week. Get some form of physical activity each day. Do not go more than 2 days in a row without some kind of activity. Avoid being inactive for more than 30 minutes at a time. Take frequent breaks to walk or stretch. Choose activities that you enjoy. Set goals that you know you can accomplish. Start slowly and increase the intensity of your exercise over time. How do I manage my diabetes during exercise?  Monitor your blood glucose Check your blood glucose before and after you exercise. If your blood glucose is 240 mg/dL (40.9 mmol/L) or higher before you exercise, check your urine for ketones. These are chemicals created by the liver. If you have ketones in your urine, do not exercise until your blood glucose returns to normal. If your blood glucose is 100 mg/dL (5.6 mmol/L) or lower, eat a snack that has 15-20 grams of carbohydrate in  it. Check your blood glucose 15 minutes after the snack to make sure that your level is above 100 mg/dL (5.6 mmol/L) before you start to exercise. Your risk for low blood glucose (hypoglycemia) goes up during and after exercise. Know the symptoms of this condition and how to treat it. Follow these instructions at home: Keep a carbohydrate snack on hand for use before, during, and after  exercise. This can help prevent or treat hypoglycemia. Avoid injecting insulin into parts of your body that are going to be used during exercise. This may include: Your arms, when you are going to play tennis. Your legs, when you are about to go jogging. Keep track of your exercise habits. This can help you and your health care provider watch and adjust your activity plan. Write down: What you eat before and after you exercise. Blood glucose levels before and after you exercise. The type and amount of exercise you do. Talk to your health care provider before you start a new activity. They may need to: Make sure that the activity is safe for you. Adjust your insulin, other medicines, and food that you eat. Drink water while you exercise. This can stop you from losing too much water (dehydration). It can also prevent problems caused by having a lot of heat in your body (heat stroke). Where to find more information American Diabetes Association: diabetes.org Association of Diabetes Care & Education Specialists: diabeteseducator.org This information is not intended to replace advice given to you by your health care provider. Make sure you discuss any questions you have with your health care provider. Document Revised: 12/11/2021 Document Reviewed: 12/11/2021 Elsevier Patient Education  2024 [employer].

## 2023-11-11 ENCOUNTER — Encounter: Payer: Self-pay | Admitting: Nurse Practitioner

## 2023-11-11 ENCOUNTER — Ambulatory Visit (INDEPENDENT_AMBULATORY_CARE_PROVIDER_SITE_OTHER): Payer: PPO | Admitting: Nurse Practitioner

## 2023-11-11 VITALS — BP 120/68 | HR 57 | Temp 97.4°F | Ht 69.5 in | Wt 201.8 lb

## 2023-11-11 DIAGNOSIS — I700 Atherosclerosis of aorta: Secondary | ICD-10-CM

## 2023-11-11 DIAGNOSIS — E1165 Type 2 diabetes mellitus with hyperglycemia: Secondary | ICD-10-CM

## 2023-11-11 DIAGNOSIS — D696 Thrombocytopenia, unspecified: Secondary | ICD-10-CM

## 2023-11-11 DIAGNOSIS — E1159 Type 2 diabetes mellitus with other circulatory complications: Secondary | ICD-10-CM | POA: Diagnosis not present

## 2023-11-11 DIAGNOSIS — D692 Other nonthrombocytopenic purpura: Secondary | ICD-10-CM | POA: Diagnosis not present

## 2023-11-11 DIAGNOSIS — E1169 Type 2 diabetes mellitus with other specified complication: Secondary | ICD-10-CM | POA: Diagnosis not present

## 2023-11-11 DIAGNOSIS — I4819 Other persistent atrial fibrillation: Secondary | ICD-10-CM

## 2023-11-11 DIAGNOSIS — E559 Vitamin D deficiency, unspecified: Secondary | ICD-10-CM

## 2023-11-11 DIAGNOSIS — J4520 Mild intermittent asthma, uncomplicated: Secondary | ICD-10-CM | POA: Diagnosis not present

## 2023-11-11 DIAGNOSIS — I152 Hypertension secondary to endocrine disorders: Secondary | ICD-10-CM | POA: Diagnosis not present

## 2023-11-11 DIAGNOSIS — E785 Hyperlipidemia, unspecified: Secondary | ICD-10-CM | POA: Diagnosis not present

## 2023-11-11 DIAGNOSIS — R569 Unspecified convulsions: Secondary | ICD-10-CM

## 2023-11-11 DIAGNOSIS — Z8673 Personal history of transient ischemic attack (TIA), and cerebral infarction without residual deficits: Secondary | ICD-10-CM | POA: Diagnosis not present

## 2023-11-11 DIAGNOSIS — E119 Type 2 diabetes mellitus without complications: Secondary | ICD-10-CM

## 2023-11-11 DIAGNOSIS — Z7984 Long term (current) use of oral hypoglycemic drugs: Secondary | ICD-10-CM | POA: Diagnosis not present

## 2023-11-11 LAB — BAYER DCA HB A1C WAIVED: HB A1C (BAYER DCA - WAIVED): 7 % — ABNORMAL HIGH (ref 4.8–5.6)

## 2023-11-11 MED ORDER — ALBUTEROL SULFATE HFA 108 (90 BASE) MCG/ACT IN AERS: 2.0000 | INHALATION_SPRAY | Freq: Four times a day (QID) | RESPIRATORY_TRACT | 4 refills | Status: DC | PRN

## 2023-11-11 NOTE — Assessment & Plan Note (Signed)
 Chronic, ongoing with BP well below neuro goal <130/80 in office and at home.  Recommend he monitor BP at home daily and document.  Will continue Telmisartan  80 MG daily only.  Continue focus on DASH diet and regular exercise at home.  LABS: CMP.  Continue collaboration with cardiology.  Recommend he wear compression hose at home to avoid syncopal episodes and orthostatic BP.

## 2023-11-11 NOTE — Assessment & Plan Note (Signed)
Chronic, improved on recent labs.  Continue daily supplement and adjust as needed. Recheck today. 

## 2023-11-11 NOTE — Assessment & Plan Note (Signed)
Chronic, ongoing.  Stable at this time.  Continue current medication regimen and collaboration with pulmonary as needed.

## 2023-11-11 NOTE — Assessment & Plan Note (Signed)
Continue collaboration with neurology due to significant stroke history.  Continue current medication regimen.  Discussed stroke prevention goals BP <130/80, LDL <55, A1c <6.5%.

## 2023-11-11 NOTE — Assessment & Plan Note (Signed)
Chronic, stable.  Being followed by neurology.  Continue current Keppra dose as prescribed by them and collaboration with neurology, reviewed notes.  Continue Amitriptyline for history of headaches post CVA. 

## 2023-11-11 NOTE — Assessment & Plan Note (Addendum)
 Chronic.  Noted on imaging 07/11/20 and previous imaging.  Recommend continue daily statin therapy.  Unable to take ASA or blood thinner due to past history.

## 2023-11-11 NOTE — Assessment & Plan Note (Signed)
Refer to diabetes with hyperglycemia plan of care.

## 2023-11-11 NOTE — Assessment & Plan Note (Signed)
Chronic.  Noted on exam, continue to monitor and recommend gentle skin care at home + notify provider if skin breakdown presents. 

## 2023-11-11 NOTE — Assessment & Plan Note (Signed)
 Chronic, ongoing with A1c 7% today, trending up down with diet and exercise changes. Urine ALB 80 February 2025. Continue Telmisartan  for kidney protection. Neuro goal <6.5%, he prefers not to add on medication and instead to continue current focus.  Continue  Metformin  1000 MG BID and Jardiance  25 MG - obtains via assistance (continue to work with PharmD).  May need to consider GLP1 next visit if elevations continue -- discussed with him and he prefers to not add any medication at this time.  Recommend he check BS twice daily + continue focus on regular exercise and diet.   - ARB on board + statin - Eye and foot exam up to date. - Vaccinations up to date with exception of Shingrix.

## 2023-11-11 NOTE — Assessment & Plan Note (Signed)
 Chronic, ongoing with cardioversion on 07/04/21 + Amulet procedure 07/18/22.  Continue collaboration with cardiology.

## 2023-11-11 NOTE — Progress Notes (Addendum)
 BP 120/68 (BP Location: Left Arm, Patient Position: Sitting, Cuff Size: Normal)   Pulse (!) 57   Temp (!) 97.4 F (36.3 C) (Oral)   Ht 5' 9.5" (1.765 m)   Wt 201 lb 12.8 oz (91.5 kg)   SpO2 94%   BMI 29.37 kg/m    Subjective:    Patient ID: Andrew Cobb, male    DOB: [birth year], 73 y.o.   MRN: [id number]  HPI: Andrew Cobb is a 73 y.o. male  Chief Complaint  Patient presents with   Diabetes   Hypertension   Hyperlipidemia   DIABETES A1c 7.5% on 08/10/23. Taking  Metformin  1000 MG BID and Jardiance  25 MG (obtains assistance with Jardiance ). Has been working on diet, cut out chocolate products and [place] [employer]. Hypoglycemic episodes:no Polydipsia/polyuria: no Visual disturbance: no Chest pain: no Paresthesias: no Glucose Monitoring: yes  Accucheck frequency: Daily  Fasting glucose: 106 to 119  Post prandial:  Evening:  Before meals: Taking Insulin ?: no  Long acting insulin :  Short acting insulin : Blood Pressure Monitoring: daily Retinal Examination: Up to Date goes for injections every 6 weeks Foot Exam: Up to Date Pneumovax: Up to Date Influenza: Up to Date Aspirin : no   HYPERTENSION / HYPERLIPIDEMIA Continues on Telmisartan  80 MG daily and Atorvastatin  40 MG daily. Aortic atherosclerosis on imaging 07/11/20.  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 Last visit with cardiology on 10/08/22.   Saw neurology last on 03/25/22. History of stroke events x 3.  Currently uses CPAP. CVA caused some mild expressive aphasia. Takes Keppra  for history of seizure like activity post CVAs + Amitriptyline  for headaches post CVA.  No recent headaches or seizures. Satisfied with current treatment? yes Duration of hypertension: chronic BP monitoring frequency: daily BP range: 130/60-70 on average BP medication side effects: no Duration of hyperlipidemia: chronic Cholesterol medication side effects: no Cholesterol supplements: none Medication compliance:  good compliance Aspirin : no Recent stressors: no Recurrent headaches: no Visual changes: no Palpitations: no Dyspnea: no Chest pain: no Lower extremity edema: no Dizzy/lightheaded: no  The ASCVD Risk score (Arnett DK, et al., 2019) failed to calculate for the following reasons:   Risk score cannot be calculated because patient has a medical history suggesting prior/existing ASCVD  ATRIAL FIBRILLATION Atrial fibrillation status: stable Satisfied with current treatment: yes  Medication side effects:  no Medication compliance: good compliance Etiology of atrial fibrillation:  Palpitations:  no Chest pain:  no Dyspnea on exertion:  no Orthopnea:  no Syncope:  no Edema:  no Ventricular rate control: Not indicated Anti-coagulation: not indicated   ASTHMA Continues on Albuterol  + allergy medications. Continue on Vitamin D  for history of lows. Asthma status: stable Satisfied with current treatment?: yes Albuterol /rescue inhaler frequency: often first thing in morning if wheezing Dyspnea frequency: no Wheezing frequency:no Cough frequency: no Nocturnal symptom frequency: no Limitation of activity: no Current upper respiratory symptoms: no Triggers: unknown Aerochamber/spacer use: no Visits to ER or Urgent Care in past year: no Pneumovax: Up to Date Influenza: Up to Date   Relevant past medical, surgical, family and social history reviewed and updated as indicated. Interim medical history since our last visit reviewed. Allergies and medications reviewed and updated.  Review of Systems  Constitutional:  Negative for activity change, diaphoresis, fatigue and fever.  Respiratory:  Negative for cough, chest tightness, shortness of breath and wheezing.   Cardiovascular:  Negative for chest pain, palpitations and leg swelling.  Gastrointestinal: Negative.   Neurological: Negative.  Psychiatric/Behavioral: Negative.      Per HPI unless specifically indicated above      Objective:    BP 120/68 (BP Location: Left Arm, Patient Position: Sitting, Cuff Size: Normal)   Pulse (!) 57   Temp (!) 97.4 F (36.3 C) (Oral)   Ht 5' 9.5" (1.765 m)   Wt 201 lb 12.8 oz (91.5 kg)   SpO2 94%   BMI 29.37 kg/m   Wt Readings from Last 3 Encounters:  11/11/23 201 lb 12.8 oz (91.5 kg)  08/10/23 204 lb (92.5 kg)  07/15/23 202 lb 12.8 oz (92 kg)    Physical Exam Vitals and nursing note reviewed.  Constitutional:      General: He is awake. He is not in acute distress.    Appearance: He is well-developed and well-groomed. He is not ill-appearing.  HENT:     Head: Normocephalic and atraumatic.     Right Ear: Hearing normal. No drainage.     Left Ear: Hearing normal. No drainage.  Eyes:     General: Lids are normal.        Right eye: No discharge.        Left eye: No discharge.     Conjunctiva/sclera: Conjunctivae normal.     Pupils: Pupils are equal, round, and reactive to light.  Neck:     Thyroid : No thyromegaly.     Vascular: No carotid bruit or JVD.     Trachea: Trachea normal.  Cardiovascular:     Rate and Rhythm: Regular rhythm. Bradycardia present.     Heart sounds: Normal heart sounds, S1 normal and S2 normal. No murmur heard.    No gallop.  Pulmonary:     Effort: Pulmonary effort is normal. No accessory muscle usage or respiratory distress.     Breath sounds: Normal breath sounds.  Abdominal:     General: Bowel sounds are normal.     Palpations: Abdomen is soft. There is no hepatomegaly or splenomegaly.  Musculoskeletal:        General: Normal range of motion.     Cervical back: Normal range of motion and neck supple.     Right lower leg: No edema.     Left lower leg: No edema.  Skin:    General: Skin is warm and dry.     Capillary Refill: Capillary refill takes less than 2 seconds.     Findings: Bruising present.     Comments: Bilateral bruises scattered to upper extremities.  Neurological:     Mental Status: He is alert and oriented to  person, place, and time.     Deep Tendon Reflexes: Reflexes are normal and symmetric.  Psychiatric:        Attention and Perception: Attention normal.        Mood and Affect: Mood normal.        Speech: Speech normal.        Behavior: Behavior normal. Behavior is cooperative.        Thought Content: Thought content normal.    Diabetic Foot Exam - Simple   Simple Foot Form Visual Inspection No deformities, no ulcerations, no other skin breakdown bilaterally: Yes Sensation Testing Intact to touch and monofilament testing bilaterally: Yes Pulse Check Posterior Tibialis and Dorsalis pulse intact bilaterally: Yes Comments     Results for orders placed or performed in visit on 08/10/23  Bayer DCA Hb A1c Waived   Collection Time: 08/10/23  8:39 AM  Result Value Ref Range   HB A1C (  BAYER DCA - WAIVED) 7.5 (H) 4.8 - 5.6 %  Microalbumin, Urine Waived   Collection Time: 08/10/23  8:39 AM  Result Value Ref Range   Microalb, Ur Waived 80 (H) 0 - 19 mg/L   Creatinine, Urine Waived 200 10 - 300 mg/dL   Microalb/Creat Ratio 30-300 (H) <30 mg/g  Comprehensive metabolic panel   Collection Time: 08/10/23  8:41 AM  Result Value Ref Range   Glucose 142 (H) 70 - 99 mg/dL   BUN 31 (H) 8 - 27 mg/dL   Creatinine, Ser 1.61 (H) 0.76 - 1.27 mg/dL   eGFR 52 (L) >09 UE/AVW/0.98   BUN/Creatinine Ratio 22 10 - 24   Sodium 140 134 - 144 mmol/L   Potassium 4.6 3.5 - 5.2 mmol/L   Chloride 101 96 - 106 mmol/L   CO2 21 20 - 29 mmol/L   Calcium  9.4 8.6 - 10.2 mg/dL   Total Protein 6.4 6.0 - 8.5 g/dL   Albumin 4.3 3.8 - 4.8 g/dL   Globulin, Total 2.1 1.5 - 4.5 g/dL   Bilirubin Total 0.5 0.0 - 1.2 mg/dL   Alkaline Phosphatase 80 44 - 121 IU/L   AST 34 0 - 40 IU/L   ALT 44 0 - 44 IU/L  Lipid Panel w/o Chol/HDL Ratio   Collection Time: 08/10/23  8:41 AM  Result Value Ref Range   Cholesterol, Total 123 100 - 199 mg/dL   Triglycerides 119 0 - 149 mg/dL   HDL 60 >14 mg/dL   VLDL Cholesterol Cal 19 5 -  40 mg/dL   LDL Chol Calc (NIH) 44 0 - 99 mg/dL  CBC with Differential/Platelet   Collection Time: 08/10/23  8:41 AM  Result Value Ref Range   WBC 4.5 3.4 - 10.8 x10E3/uL   RBC 4.25 4.14 - 5.80 x10E6/uL   Hemoglobin 13.5 13.0 - 17.7 g/dL   Hematocrit 78.2 95.6 - 51.0 %   MCV 97 79 - 97 fL   MCH 31.8 26.6 - 33.0 pg   MCHC 32.8 31.5 - 35.7 g/dL   RDW 21.3 08.6 - 57.8 %   Platelets 146 (L) 150 - 450 x10E3/uL   Neutrophils 66 Not Estab. %   Lymphs 21 Not Estab. %   Monocytes 9 Not Estab. %   Eos 3 Not Estab. %   Basos 1 Not Estab. %   Neutrophils Absolute 3.0 1.4 - 7.0 x10E3/uL   Lymphocytes Absolute 0.9 0.7 - 3.1 x10E3/uL   Monocytes Absolute 0.4 0.1 - 0.9 x10E3/uL   EOS (ABSOLUTE) 0.1 0.0 - 0.4 x10E3/uL   Basophils Absolute 0.0 0.0 - 0.2 x10E3/uL   Immature Granulocytes 0 Not Estab. %   Immature Grans (Abs) 0.0 0.0 - 0.1 x10E3/uL      Assessment & Plan:   Problem List Items Addressed This Visit       Cardiovascular and Mediastinum   Senile purpura (HCC)   Chronic.  Noted on exam, continue to monitor and recommend gentle skin care at home + notify provider if skin breakdown presents.      Relevant Orders   CBC with Differential/Platelet   Persistent atrial fibrillation (HCC)   Chronic, ongoing with cardioversion on 07/04/21 + Amulet procedure 07/18/22.  Continue collaboration with cardiology.        Relevant Orders   Comprehensive metabolic panel with GFR   Lipid Panel w/o Chol/HDL Ratio   Hypertension associated with diabetes (HCC)   Chronic, ongoing with BP well below neuro goal <130/80 in office  and at home.  Recommend he monitor BP at home daily and document.  Will continue Telmisartan  80 MG daily only.  Continue focus on DASH diet and regular exercise at home.  LABS: CMP.  Continue collaboration with cardiology.  Recommend he wear compression hose at home to avoid syncopal episodes and orthostatic BP.        Relevant Orders   Bayer DCA Hb A1c Waived    Comprehensive metabolic panel with GFR   CBC with Differential/Platelet   TSH   Aortic atherosclerosis (HCC)   Chronic.  Noted on imaging 07/11/20 and previous imaging.  Recommend continue daily statin therapy.  Unable to take ASA or blood thinner due to past history.      Relevant Orders   Comprehensive metabolic panel with GFR   Lipid Panel w/o Chol/HDL Ratio     Respiratory   Asthma   Chronic, ongoing.  Stable at this time.  Continue current medication regimen and collaboration with pulmonary as needed.      Relevant Medications   albuterol  (VENTOLIN  HFA) 108 (90 Base) MCG/ACT inhaler   Other Relevant Orders   CBC with Differential/Platelet     Endocrine   Uncontrolled type 2 diabetes mellitus with hyperglycemia, without long-term current use of insulin  (HCC) - Primary   Chronic, ongoing with A1c 7% today, trending up down with diet and exercise changes. Urine ALB 80 February 2025. Continue Telmisartan  for kidney protection. Neuro goal <6.5%, he prefers not to add on medication and instead to continue current focus.  Continue  Metformin  1000 MG BID and Jardiance  25 MG - obtains via assistance (continue to work with PharmD).  May need to consider GLP1 next visit if elevations continue -- discussed with him and he prefers to not add any medication at this time.  Recommend he check BS twice daily + continue focus on regular exercise and diet.   - ARB on board + statin - Eye and foot exam up to date. - Vaccinations up to date with exception of Shingrix.      Relevant Orders   Bayer DCA Hb A1c Waived   Hyperlipidemia associated with type 2 diabetes mellitus (HCC)   Chronic, ongoing.  Continue tight lipid control, may need to increase Atorvastatin  to 80 MG if ever elevation, recent LDL below goal for stroke prevention.  Continue current Atorvastatin  dosing.  Lipid check today.      Relevant Orders   Bayer DCA Hb A1c Waived   Comprehensive metabolic panel with GFR   Lipid Panel w/o  Chol/HDL Ratio   Diabetes mellitus treated with oral medication (HCC)   Refer to diabetes with hyperglycemia plan of care.      Relevant Orders   Bayer DCA Hb A1c Waived     Hematopoietic and Hemostatic   Thrombocytopenia (HCC)   Chronic, stable.  Noted on past labs, check CBC every 6 months and monitor closely.  Send to hematology as needed.  CBC next in July 2025.      Relevant Orders   CBC with Differential/Platelet     Other   Vitamin D  deficiency   Chronic, improved on recent labs.  Continue daily supplement and adjust as needed. Recheck today.      Relevant Orders   VITAMIN D  25 Hydroxy (Vit-D Deficiency, Fractures)   Seizures (HCC)   Chronic, stable.  Being followed by neurology.  Continue current Keppra  dose as prescribed by them and collaboration with neurology, reviewed notes.  Continue Amitriptyline  for history of headaches  post CVA.      History of stroke   Continue collaboration with neurology due to significant stroke history.  Continue current medication regimen.  Discussed stroke prevention goals BP <130/80, LDL <55, A1c <6.5%.        Follow up plan: Return in about 3 months (around 02/11/2024) for T2DM, HTN/HLD, ASTHMA -- plus needs Medicare Wellness with nurse.

## 2023-11-11 NOTE — Assessment & Plan Note (Addendum)
 Chronic, stable.  Noted on past labs, check CBC every 6 months and monitor closely.  Send to hematology as needed.  CBC next in July 2025.

## 2023-11-11 NOTE — Assessment & Plan Note (Signed)
Chronic, ongoing.  Continue tight lipid control, may need to increase Atorvastatin to 80 MG if ever elevation, recent LDL below goal for stroke prevention.  Continue current Atorvastatin dosing.  Lipid check today. 

## 2023-11-12 ENCOUNTER — Encounter: Payer: Self-pay | Admitting: Nurse Practitioner

## 2023-11-12 LAB — LIPID PANEL W/O CHOL/HDL RATIO
Cholesterol, Total: 125 mg/dL (ref 100–199)
HDL: 54 mg/dL (ref >39)
LDL Chol Calc (NIH): 51 mg/dL (ref 0–99)
Triglycerides: 109 mg/dL (ref 0–149)
VLDL Cholesterol Cal: 20 mg/dL (ref 5–40)

## 2023-11-12 LAB — CBC WITH DIFFERENTIAL/PLATELET
Basophils Absolute: 0 10*3/uL (ref 0.0–0.2)
Basos: 1 %
EOS (ABSOLUTE): 0.1 10*3/uL (ref 0.0–0.4)
Eos: 3 %
Hematocrit: 43.3 % (ref 37.5–51.0)
Hemoglobin: 14.2 g/dL (ref 13.0–17.7)
Immature Grans (Abs): 0 10*3/uL (ref 0.0–0.1)
Immature Granulocytes: 0 %
Lymphocytes Absolute: 0.8 10*3/uL (ref 0.7–3.1)
Lymphs: 18 %
MCH: 31.7 pg (ref 26.6–33.0)
MCHC: 32.8 g/dL (ref 31.5–35.7)
MCV: 97 fL (ref 79–97)
Monocytes Absolute: 0.5 10*3/uL (ref 0.1–0.9)
Monocytes: 10 %
Neutrophils Absolute: 3.3 10*3/uL (ref 1.4–7.0)
Neutrophils: 68 %
Platelets: 152 10*3/uL (ref 150–450)
RBC: 4.48 x10E6/uL (ref 4.14–5.80)
RDW: 11.5 % — ABNORMAL LOW (ref 11.6–15.4)
WBC: 4.7 10*3/uL (ref 3.4–10.8)

## 2023-11-12 LAB — COMPREHENSIVE METABOLIC PANEL WITH GFR
ALT: 47 IU/L — ABNORMAL HIGH (ref 0–44)
AST: 36 IU/L (ref 0–40)
Albumin: 4.5 g/dL (ref 3.8–4.8)
Alkaline Phosphatase: 86 IU/L (ref 44–121)
BUN/Creatinine Ratio: 24 (ref 10–24)
BUN: 27 mg/dL (ref 8–27)
Bilirubin Total: 0.6 mg/dL (ref 0.0–1.2)
CO2: 21 mmol/L (ref 20–29)
Calcium: 9.4 mg/dL (ref 8.6–10.2)
Chloride: 100 mmol/L (ref 96–106)
Creatinine, Ser: 1.13 mg/dL (ref 0.76–1.27)
Globulin, Total: 1.9 g/dL (ref 1.5–4.5)
Glucose: 126 mg/dL — ABNORMAL HIGH (ref 70–99)
Potassium: 4.3 mmol/L (ref 3.5–5.2)
Sodium: 139 mmol/L (ref 134–144)
Total Protein: 6.4 g/dL (ref 6.0–8.5)
eGFR: 69 mL/min/{1.73_m2} (ref >59)

## 2023-11-12 LAB — TSH: TSH: 1.57 u[IU]/mL (ref 0.450–4.500)

## 2023-11-12 LAB — VITAMIN D 25 HYDROXY (VIT D DEFICIENCY, FRACTURES): Vit D, 25-Hydroxy: 35.2 ng/mL (ref 30.0–100.0)

## 2023-11-12 NOTE — Progress Notes (Signed)
 Contacted via MyChart   Good afternoon Andrew Cobb, your labs have returned: - Kidney function, creatinine and eGFR, remains norma. Liver function has mild elevation in ALT we will continue to monitor.  AST is normal. - CBC shows no anemia or infection. - Remainder of labs are all stable.  Continue all medications.  Any questions? Keep being stellar!!  Thank you for allowing me to participate in your care.  I appreciate you. Kindest regards, [name]

## 2023-11-17 ENCOUNTER — Ambulatory Visit: Admitting: Emergency Medicine

## 2023-11-17 VITALS — Ht 70.0 in | Wt 195.0 lb

## 2023-11-17 DIAGNOSIS — Z0000 Encounter for general adult medical examination without abnormal findings: Secondary | ICD-10-CM

## 2023-11-17 NOTE — Progress Notes (Signed)
 Subjective:   Andrew Cobb is a 73 y.o. who presents for a Medicare Wellness preventive visit.  As a reminder, Annual Wellness Visits don't include a physical exam, and some assessments may be limited, especially if this visit is performed virtually. We may recommend an in-person visit if needed.  Visit Complete: Virtual I connected with  Andrew Cobb on 11/17/23 by a audio enabled telemedicine application and verified that I am speaking with the correct person using two identifiers.  Patient Location: Other:  Andrew Cobb, [place] on vacation  Provider Location: Office/Clinic  I discussed the limitations of evaluation and management by telemedicine. The patient expressed understanding and agreed to proceed.  Vital Signs: Because this visit was a virtual/telehealth visit, some criteria may be missing or patient reported. Any vitals not documented were not able to be obtained and vitals that have been documented are patient reported.  VideoDeclined- This patient declined Librarian, academic. Therefore the visit was completed with audio only.  Persons Participating in Visit: Andrew Cobb, wife and patient was present during visit.  AWV Questionnaire: No: Patient Medicare AWV questionnaire was not completed prior to this visit.  Cardiac Risk Factors include: advanced age (>22men, >27 women);male gender;diabetes mellitus;dyslipidemia;hypertension;Other (see comment), Risk factor comments: OSA (cpap), CAD     Objective:     Today's Vitals   11/17/23 0756  Weight: 195 lb (88.5 kg)  Height: 5\' 10"  (1.778 m)   Body mass index is 27.98 kg/m.     11/17/2023    8:09 AM 07/15/2023    9:46 AM 10/27/2022   11:39 AM 07/14/2022    9:29 AM 03/06/2022    9:38 AM 10/04/2021    8:56 PM 10/04/2021    9:39 AM  Advanced Directives  Does Patient Have a Medical Advance Directive? Yes No Yes No Yes Yes Yes  Type of Estate agent of [place];Living will   Healthcare Power of [place];Living will  Healthcare Power of [place];Living will Healthcare Power of Attorney Out of facility DNR (pink MOST or yellow form);Living will  Does patient want to make changes to medical advance directive? No - Patient declined No - Patient declined No - Patient declined No - Patient declined  No - Patient declined   Copy of Healthcare Power of Attorney in Chart? Yes - validated most recent copy scanned in chart (See row information)  Yes - validated most recent copy scanned in chart (See row information)      Would patient like information on creating a medical advance directive?    No - Patient declined       Current Medications (verified) Outpatient Encounter Medications as of 11/17/2023  Medication Sig   acyclovir  (ZOVIRAX ) 400 MG tablet Take 1 tablet (400 mg total) by mouth 2 (two) times daily.   albuterol  (VENTOLIN  HFA) 108 (90 Base) MCG/ACT inhaler Inhale 2 puffs into the lungs every 6 (six) hours as needed for wheezing or shortness of breath.   amitriptyline  (ELAVIL ) 25 MG tablet Take 1 tablet (25 mg total) by mouth at bedtime.   atorvastatin  (LIPITOR) 40 MG tablet Take 1 tablet (40 mg total) by mouth at bedtime.   cetirizine (ZYRTEC) 10 MG tablet Take 10 mg by mouth daily.   Cholecalciferol  (VITAMIN D ) 2000 units CAPS Take 2,000 Units by mouth daily.   clobetasol  cream (TEMOVATE ) 0.05 % Apply 1 Application topically 2 (two) times daily.   Cyanocobalamin  (B-12) 5000 MCG CAPS Take 5,000 mcg by mouth 2 (two) times  a week.   empagliflozin  (JARDIANCE ) 25 MG TABS tablet Take 1 tablet (25 mg total) by mouth daily.   fluticasone  (FLONASE ) 50 MCG/ACT nasal spray Place 2 sprays into both nostrils daily.   glucose blood (ONETOUCH ULTRA) test strip USE TO CHECK GLUCOSE TWICE DAILY. USE AS INSTRUCTED   Lancets (ONETOUCH ULTRASOFT) lancets Use as instructed   levETIRAcetam  (KEPPRA ) 500 MG tablet Take 1 tablet (500 mg total) by mouth 2 (two) times daily.   metFORMIN   (GLUCOPHAGE ) 1000 MG tablet Take 1 tablet (1,000 mg total) by mouth 2 (two) times daily with a meal.   Multiple Vitamins-Minerals (MULTIVITAMIN PO) Take 1 tablet by mouth daily.    tamsulosin  (FLOMAX ) 0.4 MG CAPS capsule Take 2 capsules (0.8 mg total) by mouth daily after supper. (Patient taking differently: Take 0.4 mg by mouth daily after supper.)   telmisartan  (MICARDIS ) 80 MG tablet Take 1 tablet (80 mg total) by mouth daily.   Facility-Administered Encounter Medications as of 11/17/2023  Medication   0.9 %  sodium chloride  infusion   0.9 %  sodium chloride  infusion    Allergies (verified) Bactrim [sulfamethoxazole-trimethoprim], Succinylsulphathiazole, and Tetracyclines & related   History: Past Medical History:  Diagnosis Date   Arthritis    Asthma    has not needed since 6/21   Cancer [facility])    prostate   Chronic venous insufficiency    varicose vein lower extremity with inflammation   Coronary artery disease 1996   two stents placed    Diabetes mellitus without complication (HCC)    type 2 on metformin    GERD (gastroesophageal reflux disease)    no issues since gastric bypass surgery as stated per pt   Hyperlipidemia    Hypertension    Hypogonadism in male    MRSA (methicillin resistant Staphylococcus aureus) infection    07/30/2008 thru 08/07/2008  abdominal abcess   Sleep apnea    on BIPAP   Stented coronary artery    Stroke (HCC) 09/07/2019   no defecits   Thrombocythemia    Past Surgical History:  Procedure Laterality Date   ANGIOPLASTY     ANGIOPLASTY     with stent 04/07/1995   ANTERIOR CERVICAL DECOMPRESSION/DISCECTOMY FUSION 4 LEVELS N/A 08/17/2017   Procedure: Anterior discectomy with fusion and plate fixation Cervical Three-Four, Four-Five, Five-Six, and Six-Seven Fusion;  Surgeon: Ditty, Raelene Bullocks, MD;  Location: [facility] OR;  Service: Neurosurgery;  Laterality: N/A;  Anterior discectomy with fusion and plate fixation Cervical Three-Four, Four-Five,  Five-Six, and Six-Seven Fusion    APPENDECTOMY     1966   BUBBLE STUDY  09/20/2018   Procedure: BUBBLE STUDY;  Surgeon: Loyde Rule, MD;  Location: [facility] ENDOSCOPY;  Service: Cardiovascular;;   CARDIAC CATHETERIZATION  1996   CARDIOVASCULAR STRESS TEST     07/31/2011   CARDIOVERSION N/A 07/04/2021   Procedure: CARDIOVERSION;  Surgeon: Hazle Lites, MD;  Location: [facility] ENDOSCOPY;  Service: Cardiovascular;  Laterality: N/A;   CARPAL TUNNEL RELEASE Left    CHOLECYSTECTOMY     2006   COLONOSCOPY WITH PROPOFOL  N/A 03/26/2021   Procedure: COLONOSCOPY WITH PROPOFOL ;  Surgeon: Luke Salaam, MD;  Location[facility] ENDOSCOPY;  Service: Gastroenterology;  Laterality: N/A;   colonscopy      08/25/2012   EYE SURGERY Bilateral    cataract   FUNCTIONAL ENDOSCOPIC SINUS SURGERY     11/10/2013   GASTRIC BYPASS     10/05/2012   HERNIA REPAIR     left inguinal 1981  INCISION AND DRAINAGE ABSCESS Right 02/26/2017   Procedure: INCISION AND DRAINAGE ABSCESS;  Surgeon: Bart Born, MD;  Location: [facility] ORS;  Service: Urology;  Laterality: Right;   JOINT REPLACEMENT     bilateral   left ankle surgery      05/03/2003    LEFT ATRIAL APPENDAGE OCCLUSION N/A 03/06/2022   Procedure: LEFT ATRIAL APPENDAGE OCCLUSION;  Surgeon: Boyce Byes, MD;  Location: [facility] INVASIVE CV LAB;  Service: Cardiovascular;  Laterality: N/A;   left carpel tunnel      09/18/1993   left knee meniscal tear      01/25/2010   left knee meniscal tear repair      05/04/1996   left rotator cuff repair      05/03/2003    LOOP RECORDER INSERTION N/A 09/20/2018   Procedure: LOOP RECORDER INSERTION;  Surgeon: Tammie Fall, MD;  Location: [facility] INVASIVE CV LAB;  Service: Cardiovascular;  Laterality: N/A;   REPLACEMENT TOTAL KNEE BILATERAL  07/13/2015   REVERSE SHOULDER ARTHROPLASTY Left 08/30/2020   Procedure: REVERSE SHOULDER ARTHROPLASTY;  Surgeon: Ellard Gunning, MD;  Location: [facility] ORS;  Service: Orthopedics;  Laterality: Left;     right ankle surgery      fracture has 2 screws 07/07/1997   right carpel tunnel      05/16/1992   right shoulder replacement      01/27/2006   SCROTAL EXPLORATION Right 02/26/2017   Procedure: SCROTUM EXPLORATION;  Surgeon: Bart Born, MD;  Location: [facility] ORS;  Service: Urology;  Laterality: Right;   TEE WITHOUT CARDIOVERSION N/A 09/20/2018   Procedure: TRANSESOPHAGEAL ECHOCARDIOGRAM (TEE);  Surgeon: Loyde Rule, MD;  Location: [facility] ENDOSCOPY;  Service: Cardiovascular;  Laterality: N/A;   TEE WITHOUT CARDIOVERSION N/A 03/06/2022   Procedure: TRANSESOPHAGEAL ECHOCARDIOGRAM (TEE);  Surgeon: Boyce Byes, MD;  Location: [facility] INVASIVE CV LAB;  Service: Cardiovascular;  Laterality: N/A;   TOTAL KNEE ARTHROPLASTY Left 07/13/2015   Procedure: LEFT TOTAL KNEE ARTHROPLASTY;  Surgeon: Liliane Rei, MD;  Location: [facility] ORS;  Service: Orthopedics;  Laterality: Left;   Family History  Problem Relation Age of Onset   Cancer Mother        pancreatic   Diabetes Mother    Stroke Mother    Heart disease Mother    Hyperlipidemia Mother    Hypertension Mother    Heart attack Mother    Heart disease Father    Stroke Father    Diabetes Father    Hypertension Father    Heart attack Father    Hyperlipidemia Father    Pancreatic cancer Father    Cancer Sister    Cancer Brother        lung   Cancer Brother    Kidney cancer Neg Hx    Bladder Cancer Neg Hx    Prostate cancer Neg Hx    Social History   Socioeconomic History   Marital status: Married    Spouse name: Andrew Cobb   Number of children: 2   Years of education: Not on file   Highest education level: Some college, no degree  Occupational History    Comment: drives for [employer]   Tobacco Use   Smoking status: Former    Current packs/day: 0.00    Average packs/day: 1 pack/day for 10.0 years (10.0 ttl pk-yrs)    Types: Cigarettes    Start date: 07/07/1974    Quit date: 07/07/1984    Years since quitting: 39.3    Passive exposure:  Never   Smokeless tobacco: Never   Tobacco comments:    Former Smoker quit 1986 07/23/2021  Vaping Use   Vaping status: Never Used  Substance and Sexual Activity   Alcohol use: No    Alcohol/week: 0.0 standard drinks of alcohol   Drug use: No   Sexual activity: Yes  Other Topics Concern   Not on file  Social History Narrative   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   Social Drivers of Health   Financial Resource Strain: Low Risk  (11/17/2023)   Overall Financial Resource Strain (CARDIA)    Difficulty of Paying Living Expenses: Not hard at all  Food Insecurity: No Food Insecurity (11/17/2023)   Hunger Vital Sign    Worried About Running Out of Food in the Last Year: Never true    Ran Out of Food in the Last Year: Never true  Transportation Needs: No Transportation Needs (11/17/2023)   PRAPARE - Administrator, Civil Service (Medical): No    Lack of Transportation (Non-Medical): No  Physical Activity: Sufficiently Active (11/17/2023)   Exercise Vital Sign    Days of Exercise per Week: 4 days    Minutes of Exercise per Session: 40 min  Stress: No Stress Concern Present (11/17/2023)   [employer] of Occupational Health - Occupational Stress Questionnaire    Feeling of Stress : Not at all  Social Connections: Moderately Integrated (11/17/2023)   Social Connection and Isolation Panel [NHANES]    Frequency of Communication with Friends and Family: Once a week    Frequency of Social Gatherings with Friends and Family: Three times a week    Attends Religious Services: More than 4 times per year    Active Member of Clubs or Organizations: No    Attends Banker Meetings: Never    Marital Status: Married    Tobacco Counseling Counseling given: Not Answered Tobacco comments: Former Smoker quit 1986 07/23/2021    Clinical Intake:  Pre-visit preparation completed: Yes  Pain : No/denies pain     BMI - recorded:  27.98 Nutritional Status: BMI 25 -29 Overweight Nutritional Risks: None Diabetes: Yes (FBS 106 per patient) CBG done?: No Did pt. bring in CBG monitor from home?: No  Lab Results  Component Value Date   HGBA1C 7.0 (H) 11/11/2023   HGBA1C 7.5 (H) 08/10/2023   HGBA1C 7.0 (H) 05/07/2023     How often do you need to have someone help you when you read instructions, pamphlets, or other written materials from your doctor or pharmacy?: 1 - Never  Interpreter Needed?: No  Information entered by :: Jaunita Messier, CMA   Activities of Daily Living     11/17/2023    7:59 AM 08/10/2023    8:53 AM  In your present state of health, do you have any difficulty performing the following activities:  Hearing? 1 0  Comment wears hearing aids   Vision? 0 0  Difficulty concentrating or making decisions? 0 0  Walking or climbing stairs? 0 0  Dressing or bathing? 0 0  Doing errands, shopping? 0 0  Preparing Food and eating ? N   Using the Toilet? N   In the past six months, have you accidently leaked urine? N   Do you have problems with loss of bowel control? N   Managing your Medications? N   Managing your Finances? N   Housekeeping or managing your Housekeeping? N     Patient Care Team: Niarada,  Lavelle Posey, NP as PCP - General (Nurse Practitioner) Tammie Fall, MD as PCP - Electrophysiology (Cardiology) Glenis Langdon, MD as Radiation Oncologist (Radiation Oncology) Rolando Cliche, [facility] as Pharmacist (Pharmacist) Maris Sickle, MD as Referring Physician (Ophthalmology)  Indicate any recent Medical Services you may have received from other than [facility] providers in the past year (date may be approximate).     Assessment:    This is a routine wellness examination for [place].  Hearing/Vision screen Hearing Screening - Comments:: Wears hearing aids Vision Screening - Comments:: Gets diabetic eye exams, Dr. Candi Chafe, [place], [place]   Goals Addressed             This Visit's Progress     Patient Stated       Lose 15-20 lbs       Depression Screen     11/17/2023    8:07 AM 11/11/2023    9:25 AM 10/27/2022   11:37 AM 11/01/2021    9:11 AM 07/24/2021   10:42 AM 05/06/2021    2:10 PM 07/20/2020   10:34 AM  PHQ 2/9 Scores  PHQ - 2 Score 0 0 0 0 0 0 0  PHQ- 9 Score 0 0 0 0  0     Fall Risk     11/17/2023    8:11 AM 11/11/2023    9:25 AM 10/27/2022   11:40 AM 11/01/2021    9:11 AM 07/24/2021   10:34 AM  Fall Risk   Falls in the past year? 0 0 0 1 0  Number falls in past yr: 0 0 0 1 0  Injury with Fall? 0 0 0 1 0  Risk for fall due to : No Fall Risks No Fall Risks No Fall Risks History of fall(s)   Follow up Falls prevention discussed;Falls evaluation completed Falls evaluation completed Falls prevention discussed;Falls evaluation completed Falls evaluation completed Falls evaluation completed;Falls prevention discussed    MEDICARE RISK AT HOME:  Medicare Risk at Home Any stairs in or around the home?: Yes (2 steps) If so, are there any without handrails?: Yes Home free of loose throw rugs in walkways, pet beds, electrical cords, etc?: Yes Adequate lighting in your home to reduce risk of falls?: Yes Life alert?: No Use of a cane, walker or w/c?: No Grab bars in the bathroom?: No Shower chair or bench in shower?: No Elevated toilet seat or a handicapped toilet?: Yes  TIMED UP AND GO:  Was the test performed?  No  Cognitive Function: 6CIT completed        11/17/2023    8:12 AM 10/27/2022   11:46 AM 07/20/2020   10:35 AM 04/22/2018    9:10 AM 04/20/2017    8:14 AM  6CIT Screen  What Year? 0 points 0 points 0 points 0 points 0 points  What month? 0 points 0 points 0 points 0 points 0 points  What time? 0 points 0 points 0 points 0 points 0 points  Count back from 20 2 points 0 points 0 points 0 points 0 points  Months in reverse 2 points 0 points 4 points 0 points   Repeat phrase 6 points 0 points 6 points 0 points 0 points  Total Score 10 points 0 points 10  points 0 points     Immunizations Immunization History  Administered Date(s) Administered   Fluad Quad(high Dose 65+) 03/16/2019, 03/20/2020, 04/22/2021, 04/15/2022   Fluad Trivalent(High Dose 65+) 04/24/2023   Influenza Whole 04/09/2009   Influenza, High Dose  Seasonal PF 04/06/2017, 04/22/2018   Influenza,inj,Quad PF,6+ Mos 04/24/2015   Influenza,inj,quad, With Preservative 04/06/2020   Influenza-Unspecified 03/07/2014, 04/06/2017   Moderna Sars-Covid-2 Vaccination 12/19/2019, 01/16/2020   Pneumococcal Conjugate-13 03/20/2020   Pneumococcal Polysaccharide-23 05/07/2021   Td 03/12/2005   Tdap 07/17/2016   Zoster, Live 07/17/2016    Screening Tests Health Maintenance  Topic Date Due   COVID-19 Vaccine (3 - Moderna risk series) 11/24/2023 (Originally 02/13/2020)   Zoster Vaccines- Shingrix (1 of 2) 02/11/2024 (Originally 01/20/1970)   INFLUENZA VACCINE  02/05/2024   HEMOGLOBIN A1C  05/13/2024   OPHTHALMOLOGY EXAM  07/19/2024   Diabetic kidney evaluation - Urine ACR  08/09/2024   Diabetic kidney evaluation - eGFR measurement  11/10/2024   FOOT EXAM  11/10/2024   Medicare Annual Wellness (AWV)  11/16/2024   DTaP/Tdap/Td (3 - Td or Tdap) 07/17/2026   Colonoscopy  03/26/2028   Pneumonia Vaccine 60+ Years old  Completed   Hepatitis C Screening  Completed   HPV VACCINES  Aged Out   Meningococcal B Vaccine  Aged Out    Health Maintenance  There are no preventive care reminders to display for this patient.  Health Maintenance Items Addressed: See Nurse Notes  Additional Screening:  Vision Screening: Recommended annual ophthalmology exams for early detection of glaucoma and other disorders of the eye.  Dental Screening: Recommended annual dental exams for proper oral hygiene  Community Resource Referral / Chronic Care Management: CRR required this visit?  No   CCM required this visit?  No   Plan:    I have personally reviewed and noted the following in the patient's  chart:   Medical and social history Use of alcohol, tobacco or illicit drugs  Current medications and supplements including opioid prescriptions. Patient is not currently taking opioid prescriptions. Functional ability and status Nutritional status Physical activity Advanced directives List of other physicians Hospitalizations, surgeries, and ER visits in previous 12 months Vitals Screenings to include cognitive, depression, and falls Referrals and appointments  In addition, I have reviewed and discussed with patient certain preventive protocols, quality metrics, and best practice recommendations. A written personalized care plan for preventive services as well as general preventive health recommendations were provided to patient.   Jaunita Messier, CMA   11/17/2023   After Visit Summary: (MyChart) Due to this being a telephonic visit, the after visit summary with patients personalized plan was offered to patient via MyChart   Notes: Please refer to Routing Comments.

## 2023-11-17 NOTE — Patient Instructions (Signed)
 [name] , Thank you for taking time out of your busy schedule to complete your Annual Wellness Visit with me. I enjoyed our conversation and look forward to speaking with you again next year. I, as well as your care team,  appreciate your ongoing commitment to your health goals. Please review the following plan we discussed and let me know if I can assist you in the future. Your Game plan/ To Do List    Referrals: None  Follow up Visits: Next Medicare AWV with our clinical staff: 11/24/24 @ 8:00am (phone visit)   Have you seen your provider in the last 6 months (3 months if uncontrolled diabetes)? Yes Next Office Visit with your provider: 02/17/24 @ 9:40am  Clinician Recommendations:  Aim for 30 minutes of exercise or brisk walking, 6-8 glasses of water , and 5 servings of fruits and vegetables each day.       This is a list of the screening recommended for you and due dates:  Health Maintenance  Topic Date Due   COVID-19 Vaccine (3 - Moderna risk series) 11/24/2023*   Zoster (Shingles) Vaccine (1 of 2) 02/11/2024*   Flu Shot  02/05/2024   Hemoglobin A1C  05/13/2024   Eye exam for diabetics  07/19/2024   Yearly kidney health urinalysis for diabetes  08/09/2024   Yearly kidney function blood test for diabetes  11/10/2024   Complete foot exam   11/10/2024   Medicare Annual Wellness Visit  11/16/2024   DTaP/Tdap/Td vaccine (3 - Td or Tdap) 07/17/2026   Colon Cancer Screening  03/26/2028   Pneumonia Vaccine  Completed   Hepatitis C Screening  Completed   HPV Vaccine  Aged Out   Meningitis B Vaccine  Aged Out  *Topic was postponed. The date shown is not the original due date.    Advanced directives: (In Chart) A copy of your advanced directives are scanned into your chart should your provider ever need it. Advance Care Planning is important because it:  [x]  Makes sure you receive the medical care that is consistent with your values, goals, and preferences  [x]  It provides guidance  to your family and loved ones and reduces their decisional burden about whether or not they are making the right decisions based on your wishes.  Follow the link provided in your after visit summary or read over the paperwork we have mailed to you to help you started getting your Advance Directives in place. If you need assistance in completing these, please reach out to us  so that we can help you!  See attachments for Preventive Care and Fall Prevention Tips. Fall Prevention in the Home, Adult Falls can cause injuries and affect people of all ages. There are many simple things that you can do to make your home safe and to help prevent falls. If you need it, ask for help making these changes. What actions can I take to prevent falls? General information Use good lighting in all rooms. Make sure to: Replace any light bulbs that burn out. Turn on lights if it is dark and use night-lights. Keep items that you use often in easy-to-reach places. Lower the shelves around your home if needed. Move furniture so that there are clear paths around it. Do not keep throw rugs or other things on the floor that can make you trip. If any of your floors are uneven, fix them. Add color or contrast paint or tape to clearly mark and help you see: Grab bars or handrails. First  and last steps of staircases. Where the edge of each step is. If you use a ladder or stepladder: Make sure that it is fully opened. Do not climb a closed ladder. Make sure the sides of the ladder are locked in place. Have someone hold the ladder while you use it. Know where your pets are as you move through your home. What can I do in the bathroom?     Keep the floor dry. Clean up any water  that is on the floor right away. Remove soap buildup in the bathtub or shower. Buildup makes bathtubs and showers slippery. Use non-skid mats or decals on the floor of the bathtub or shower. Attach bath mats securely with double-sided, non-slip  rug tape. If you need to sit down while you are in the shower, use a non-slip stool. Install grab bars by the toilet and in the bathtub and shower. Do not use towel bars as grab bars. What can I do in the bedroom? Make sure that you have a light by your bed that is easy to reach. Do not use any sheets or blankets on your bed that hang to the floor. Have a firm bench or chair with side arms that you can use for support when you get dressed. What can I do in the kitchen? Clean up any spills right away. If you need to reach something above you, use a sturdy step stool that has a grab bar. Keep electrical cables out of the way. Do not use floor polish or wax that makes floors slippery. What can I do with my stairs? Do not leave anything on the stairs. Make sure that you have a light switch at the top and the bottom of the stairs. Have them installed if you do not have them. Make sure that there are handrails on both sides of the stairs. Fix handrails that are broken or loose. Make sure that handrails are as long as the staircases. Install non-slip stair treads on all stairs in your home if they do not have carpet. Avoid having throw rugs at the top or bottom of stairs, or secure the rugs with carpet tape to prevent them from moving. Choose a carpet design that does not hide the edge of steps on the stairs. Make sure that carpet is firmly attached to the stairs. Fix any carpet that is loose or worn. What can I do on the outside of my home? Use bright outdoor lighting. Repair the edges of walkways and driveways and fix any cracks. Clear paths of anything that can make you trip, such as tools or rocks. Add color or contrast paint or tape to clearly mark and help you see high doorway thresholds. Trim any bushes or trees on the main path into your home. Check that handrails are securely fastened and in good repair. Both sides of all steps should have handrails. Install guardrails along the edges of  any raised decks or porches. Have leaves, snow, and ice cleared regularly. Use sand, salt, or ice melt on walkways during winter months if you live where there is ice and snow. In the garage, clean up any spills right away, including grease or oil spills. What other actions can I take? Review your medicines with your health care provider. Some medicines can make you confused or feel dizzy. This can increase your chance of falling. Wear closed-toe shoes that fit well and support your feet. Wear shoes that have rubber soles and low heels. Use a cane, walker,  scooter, or crutches that help you move around if needed. Talk with your provider about other ways that you can decrease your risk of falls. This may include seeing a physical therapist to learn to do exercises to improve movement and strength. Where to find more information Centers for Disease Control and Prevention, STEADI: TonerPromos.no [employer] on Aging: BaseRingTones.pl National Institute on Aging: BaseRingTones.pl Contact a health care provider if: You are afraid of falling at home. You feel weak, drowsy, or dizzy at home. You fall at home. Get help right away if you: Lose consciousness or have trouble moving after a fall. Have a fall that causes a head injury. These symptoms may be an emergency. Get help right away. Call 911. Do not wait to see if the symptoms will go away. Do not drive yourself to the hospital. This information is not intended to replace advice given to you by your health care provider. Make sure you discuss any questions you have with your health care provider. Document Revised: 02/24/2022 Document Reviewed: 02/24/2022 Elsevier Patient Education  2024 [employer].

## 2023-12-17 DIAGNOSIS — H02836 Dermatochalasis of left eye, unspecified eyelid: Secondary | ICD-10-CM | POA: Diagnosis not present

## 2023-12-17 DIAGNOSIS — H35721 Serous detachment of retinal pigment epithelium, right eye: Secondary | ICD-10-CM | POA: Diagnosis not present

## 2023-12-17 DIAGNOSIS — E119 Type 2 diabetes mellitus without complications: Secondary | ICD-10-CM | POA: Diagnosis not present

## 2023-12-17 DIAGNOSIS — H353122 Nonexudative age-related macular degeneration, left eye, intermediate dry stage: Secondary | ICD-10-CM | POA: Diagnosis not present

## 2023-12-17 DIAGNOSIS — H353113 Nonexudative age-related macular degeneration, right eye, advanced atrophic without subfoveal involvement: Secondary | ICD-10-CM | POA: Diagnosis not present

## 2023-12-17 DIAGNOSIS — H43821 Vitreomacular adhesion, right eye: Secondary | ICD-10-CM | POA: Diagnosis not present

## 2023-12-17 DIAGNOSIS — H353211 Exudative age-related macular degeneration, right eye, with active choroidal neovascularization: Secondary | ICD-10-CM | POA: Diagnosis not present

## 2023-12-17 DIAGNOSIS — H02833 Dermatochalasis of right eye, unspecified eyelid: Secondary | ICD-10-CM | POA: Diagnosis not present

## 2023-12-17 LAB — HM DIABETES EYE EXAM

## 2023-12-22 ENCOUNTER — Ambulatory Visit

## 2024-01-21 ENCOUNTER — Encounter: Payer: Self-pay | Admitting: Nurse Practitioner

## 2024-01-21 DIAGNOSIS — E119 Type 2 diabetes mellitus without complications: Secondary | ICD-10-CM | POA: Diagnosis not present

## 2024-01-21 DIAGNOSIS — H35721 Serous detachment of retinal pigment epithelium, right eye: Secondary | ICD-10-CM | POA: Diagnosis not present

## 2024-01-21 DIAGNOSIS — H353113 Nonexudative age-related macular degeneration, right eye, advanced atrophic without subfoveal involvement: Secondary | ICD-10-CM | POA: Diagnosis not present

## 2024-01-21 DIAGNOSIS — H353211 Exudative age-related macular degeneration, right eye, with active choroidal neovascularization: Secondary | ICD-10-CM | POA: Diagnosis not present

## 2024-01-21 DIAGNOSIS — H02836 Dermatochalasis of left eye, unspecified eyelid: Secondary | ICD-10-CM | POA: Diagnosis not present

## 2024-01-21 DIAGNOSIS — H02833 Dermatochalasis of right eye, unspecified eyelid: Secondary | ICD-10-CM | POA: Diagnosis not present

## 2024-01-21 DIAGNOSIS — H43821 Vitreomacular adhesion, right eye: Secondary | ICD-10-CM | POA: Diagnosis not present

## 2024-01-21 DIAGNOSIS — H353122 Nonexudative age-related macular degeneration, left eye, intermediate dry stage: Secondary | ICD-10-CM | POA: Diagnosis not present

## 2024-01-26 ENCOUNTER — Other Ambulatory Visit: Payer: Self-pay | Admitting: Nurse Practitioner

## 2024-01-28 NOTE — Telephone Encounter (Signed)
 Requested Prescriptions  Pending Prescriptions Disp Refills   JARDIANCE  25 MG TABS tablet [Pharmacy Med Name: JARDIANCE  25MG  TAB] 90 tablet 0    Sig: TAKE ONE TABLET BY MOUTH DAILY     Endocrinology:  Diabetes - SGLT2 Inhibitors Passed - 01/28/2024  1:22 PM      Passed - Cr in normal range and within 360 days    Creatinine  Date Value Ref Range Status  07/29/2011 0.99 0.60 - 1.30 mg/dL Final   Creatinine, Ser  Date Value Ref Range Status  11/11/2023 1.13 0.76 - 1.27 mg/dL Final         Passed - HBA1C is between 0 and 7.9 and within 180 days    HB A1C (BAYER DCA - WAIVED)  Date Value Ref Range Status  11/11/2023 7.0 (H) 4.8 - 5.6 % Final    Comment:             Prediabetes: 5.7 - 6.4          Diabetes: >6.4          Glycemic control for adults with diabetes: <7.0          Passed - eGFR in normal range and within 360 days    EGFR (African American)  Date Value Ref Range Status  07/29/2011 >60 >39mL/min Final   GFR calc Af Amer  Date Value Ref Range Status  08/27/2020 67 >59 mL/min/1.73 Final    Comment:    **In accordance with recommendations from the NKF-ASN Task force,**   [facility] is in the process of updating its eGFR calculation to the   2021 CKD-EPI creatinine equation that estimates kidney function   without a race variable.    EGFR (Non-African Amer.)  Date Value Ref Range Status  07/29/2011 >60 >26mL/min Final    Comment:    eGFR values <89mL/min/1.73 m2 may be an indication of chronic kidney disease (CKD). Calculated eGFR, using the MRDR Study equation, is useful in  patients with stable renal function. The eGFR calculation will not be reliable in acutely ill patients when serum creatinine is changing rapidly. It is not useful in patients on dialysis. The eGFR calculation may not be applicable to patients at the low and high extremes of body sizes, pregnant women, and vegetarians.    GFR, Estimated  Date Value Ref Range Status  12/11/2021 >60 >60  mL/min Final    Comment:    (NOTE) Calculated using the CKD-EPI Creatinine Equation (2021)    GFR  Date Value Ref Range Status  10/14/2019 49.88 (L) >60.00 mL/min Final   eGFR  Date Value Ref Range Status  11/11/2023 69 >59 mL/min/1.73 Final         Passed - Valid encounter within last 6 months    Recent Outpatient Visits           2 months ago Uncontrolled type 2 diabetes mellitus with hyperglycemia, without long-term current use of insulin  (HCC)   [facility] [place], Laurel T, NP   5 months ago Uncontrolled type 2 diabetes mellitus with hyperglycemia, without long-term current use of insulin  (HCC)   [facility] [place], Melanie DASEN, NP

## 2024-02-02 DIAGNOSIS — M7061 Trochanteric bursitis, right hip: Secondary | ICD-10-CM | POA: Diagnosis not present

## 2024-02-11 DIAGNOSIS — Z96652 Presence of left artificial knee joint: Secondary | ICD-10-CM | POA: Diagnosis not present

## 2024-02-11 DIAGNOSIS — M7062 Trochanteric bursitis, left hip: Secondary | ICD-10-CM | POA: Diagnosis not present

## 2024-02-11 DIAGNOSIS — M5451 Vertebrogenic low back pain: Secondary | ICD-10-CM | POA: Diagnosis not present

## 2024-02-13 NOTE — Patient Instructions (Signed)
Be Involved in Caring For Your Health:  Taking Medications When medications are taken as directed, they can greatly improve your health. But if they are not taken as prescribed, they may not work. In some cases, not taking them correctly can be harmful. To help ensure your treatment remains effective and safe, understand your medications and how to take them. Bring your medications to each visit for review by your provider.  Your lab results, notes, and after visit summary will be available on My Chart. We strongly encourage you to use this feature. If lab results are abnormal the clinic will contact you with the appropriate steps. If the clinic does not contact you assume the results are satisfactory. You can always view your results on My Chart. If you have questions regarding your health or results, please contact the clinic during office hours. You can also ask questions on My Chart.  We at [facility] are grateful that you chose [place] to provide your care. We strive to provide evidence-based and compassionate care and are always looking for feedback. If you get a survey from the clinic please complete this so we can hear your opinions.  Diabetes Mellitus and Exercise Regular exercise is important for your health, especially if you have diabetes mellitus. Exercise is not just about losing weight. It can also help you increase muscle strength and bone density and reduce body fat and stress. This can help your level of endurance and make you more fit and flexible. Why should I exercise if I have diabetes? Exercise has many benefits for people with diabetes. It can: Help lower and control your blood sugar (glucose). Help your body respond better and become more sensitive to the hormone insulin. Reduce how much insulin your body needs. Lower your risk for heart disease by: Lowering how much "bad" cholesterol and triglycerides you have in your body. Increasing how much "good" cholesterol  you have in your body. Lowering your blood pressure. Lowering your blood glucose levels. What is my activity plan? Your health care provider or an expert trained in diabetes care (certified diabetes educator) can help you make an activity plan. This plan can help you find the type of exercise that works for you. It may also tell you how often to exercise and for how long. Be sure to: Get at least 150 minutes of medium-intensity or high-intensity exercise each week. This may involve brisk walking, biking, or water aerobics. Do stretching and strengthening exercises at least 2 times a week. This may involve yoga or weight lifting. Spread out your activity over at least 3 days of the week. Get some form of physical activity each day. Do not go more than 2 days in a row without some kind of activity. Avoid being inactive for more than 30 minutes at a time. Take frequent breaks to walk or stretch. Choose activities that you enjoy. Set goals that you know you can accomplish. Start slowly and increase the intensity of your exercise over time. How do I manage my diabetes during exercise?  Monitor your blood glucose Check your blood glucose before and after you exercise. If your blood glucose is 240 mg/dL (40.9 mmol/L) or higher before you exercise, check your urine for ketones. These are chemicals created by the liver. If you have ketones in your urine, do not exercise until your blood glucose returns to normal. If your blood glucose is 100 mg/dL (5.6 mmol/L) or lower, eat a snack that has 15-20 grams of carbohydrate in  it. Check your blood glucose 15 minutes after the snack to make sure that your level is above 100 mg/dL (5.6 mmol/L) before you start to exercise. Your risk for low blood glucose (hypoglycemia) goes up during and after exercise. Know the symptoms of this condition and how to treat it. Follow these instructions at home: Keep a carbohydrate snack on hand for use before, during, and after  exercise. This can help prevent or treat hypoglycemia. Avoid injecting insulin into parts of your body that are going to be used during exercise. This may include: Your arms, when you are going to play tennis. Your legs, when you are about to go jogging. Keep track of your exercise habits. This can help you and your health care provider watch and adjust your activity plan. Write down: What you eat before and after you exercise. Blood glucose levels before and after you exercise. The type and amount of exercise you do. Talk to your health care provider before you start a new activity. They may need to: Make sure that the activity is safe for you. Adjust your insulin, other medicines, and food that you eat. Drink water while you exercise. This can stop you from losing too much water (dehydration). It can also prevent problems caused by having a lot of heat in your body (heat stroke). Where to find more information American Diabetes Association: diabetes.org Association of Diabetes Care & Education Specialists: diabeteseducator.org This information is not intended to replace advice given to you by your health care provider. Make sure you discuss any questions you have with your health care provider. Document Revised: 12/11/2021 Document Reviewed: 12/11/2021 Elsevier Patient Education  2024 [employer].

## 2024-02-17 ENCOUNTER — Encounter: Payer: Self-pay | Admitting: Nurse Practitioner

## 2024-02-17 ENCOUNTER — Ambulatory Visit: Admitting: Nurse Practitioner

## 2024-02-17 VITALS — BP 112/73 | HR 80 | Temp 98.3°F | Ht 69.5 in | Wt 196.4 lb

## 2024-02-17 DIAGNOSIS — I152 Hypertension secondary to endocrine disorders: Secondary | ICD-10-CM | POA: Diagnosis not present

## 2024-02-17 DIAGNOSIS — E119 Type 2 diabetes mellitus without complications: Secondary | ICD-10-CM

## 2024-02-17 DIAGNOSIS — G4733 Obstructive sleep apnea (adult) (pediatric): Secondary | ICD-10-CM

## 2024-02-17 DIAGNOSIS — D692 Other nonthrombocytopenic purpura: Secondary | ICD-10-CM | POA: Diagnosis not present

## 2024-02-17 DIAGNOSIS — E1159 Type 2 diabetes mellitus with other circulatory complications: Secondary | ICD-10-CM | POA: Diagnosis not present

## 2024-02-17 DIAGNOSIS — E1169 Type 2 diabetes mellitus with other specified complication: Secondary | ICD-10-CM | POA: Diagnosis not present

## 2024-02-17 DIAGNOSIS — I4819 Other persistent atrial fibrillation: Secondary | ICD-10-CM

## 2024-02-17 DIAGNOSIS — Z8673 Personal history of transient ischemic attack (TIA), and cerebral infarction without residual deficits: Secondary | ICD-10-CM

## 2024-02-17 DIAGNOSIS — Z7984 Long term (current) use of oral hypoglycemic drugs: Secondary | ICD-10-CM

## 2024-02-17 DIAGNOSIS — D696 Thrombocytopenia, unspecified: Secondary | ICD-10-CM

## 2024-02-17 DIAGNOSIS — E1165 Type 2 diabetes mellitus with hyperglycemia: Secondary | ICD-10-CM

## 2024-02-17 DIAGNOSIS — E785 Hyperlipidemia, unspecified: Secondary | ICD-10-CM | POA: Diagnosis not present

## 2024-02-17 DIAGNOSIS — R569 Unspecified convulsions: Secondary | ICD-10-CM

## 2024-02-17 LAB — BAYER DCA HB A1C WAIVED: HB A1C (BAYER DCA - WAIVED): 7.2 % — ABNORMAL HIGH (ref 4.8–5.6)

## 2024-02-17 MED ORDER — AMITRIPTYLINE HCL 25 MG PO TABS: 25.0000 mg | ORAL_TABLET | Freq: Every day | ORAL | 4 refills | Status: DC

## 2024-02-17 MED ORDER — METFORMIN HCL 1000 MG PO TABS: 1000.0000 mg | ORAL_TABLET | Freq: Two times a day (BID) | ORAL | 4 refills | Status: DC

## 2024-02-17 MED ORDER — LEVETIRACETAM 500 MG PO TABS: 500.0000 mg | ORAL_TABLET | Freq: Two times a day (BID) | ORAL | 4 refills | Status: DC

## 2024-02-17 MED ORDER — ATORVASTATIN CALCIUM 40 MG PO TABS: 40.0000 mg | ORAL_TABLET | Freq: Every day | ORAL | 4 refills | Status: DC

## 2024-02-17 MED ORDER — FLUTICASONE PROPIONATE 50 MCG/ACT NA SUSP: 2.0000 | Freq: Every day | NASAL | 4 refills | Status: DC

## 2024-02-17 MED ORDER — ACYCLOVIR 400 MG PO TABS: 400.0000 mg | ORAL_TABLET | Freq: Two times a day (BID) | ORAL | 4 refills | Status: DC

## 2024-02-17 MED ORDER — TELMISARTAN 80 MG PO TABS: 80.0000 mg | ORAL_TABLET | Freq: Every day | ORAL | 4 refills | Status: DC

## 2024-02-17 NOTE — Assessment & Plan Note (Signed)
 Chronic, stable.  Noted on past labs, check CBC every 6 months and monitor closely.  Send to hematology as needed.  CBC next in November 2025.
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
## 2024-02-17 NOTE — Assessment & Plan Note (Signed)
Refer to diabetes with hyperglycemia plan of care.

## 2024-02-17 NOTE — Assessment & Plan Note (Signed)
 Chronic, ongoing with cardioversion on 07/04/21 + Amulet procedure 07/18/22.  Continue collaboration with cardiology, highly recommend he schedule follow-up with them for an annual visit and discussed with him importance of this.  Regular rhythm on exam today.

## 2024-02-17 NOTE — Assessment & Plan Note (Signed)
 Chronic, ongoing with BP well below neuro goal <130/80 in office and at home.  Recommend he monitor BP at home daily and document.  Will continue Telmisartan  80 MG daily only.  Continue focus on DASH diet and regular exercise at home.  LABS: CMP.  Continue collaboration with cardiology, highly recommend he schedule follow-up with them.  Recommend he wear compression hose at home to avoid syncopal episodes and orthostatic BP.

## 2024-02-17 NOTE — Assessment & Plan Note (Signed)
 Chronic, stable.  Being followed by neurology, recommend he schedule follow-up with them.  Continue current Keppra  dose as prescribed by them and collaboration with neurology, reviewed notes.  Continue Amitriptyline  for history of headaches post CVA.

## 2024-02-17 NOTE — Assessment & Plan Note (Signed)
Continue collaboration with neurology due to significant stroke history.  Continue current medication regimen.  Discussed stroke prevention goals BP <130/80, LDL <55, A1c <6.5%.

## 2024-02-17 NOTE — Assessment & Plan Note (Signed)
Chronic.  Noted on exam, continue to monitor and recommend gentle skin care at home + notify provider if skin breakdown presents. 

## 2024-02-17 NOTE — Assessment & Plan Note (Signed)
 Chronic, ongoing with A1c 7.2% today, trending up with recent steroid injection. Urine ALB 80 February 2025. Continue Telmisartan  for kidney protection. Neuro goal <6.5%, he prefers not to add on medication at this time, although this was recommended, and instead to continue current focus.  Continue  Metformin  1000 MG BID and Jardiance  25 MG - obtains via assistance (continue to work with PharmD).  May need to consider GLP1 or Januvia next visit if elevations continue -- discussed with him and he prefers to not add any medication at this time.  Recommend he check BS twice daily + continue focus on regular exercise and diet.   - ARB on board + statin - Eye and foot exam up to date. - Vaccinations up to date with exception of Shingrix.

## 2024-02-17 NOTE — Assessment & Plan Note (Signed)
 Chronic, stable with 100% use of CPAP.  Praised for this.  Neurology was looking into [facility] for him in the past.

## 2024-02-17 NOTE — Assessment & Plan Note (Signed)
 Chronic.  Noted on exam, continue to monitor and recommend gentle skin care at home + notify provider if skin breakdown presents.

## 2024-02-17 NOTE — Assessment & Plan Note (Signed)
Chronic, ongoing.  Continue tight lipid control, may need to increase Atorvastatin to 80 MG if ever elevation, recent LDL below goal for stroke prevention.  Continue current Atorvastatin dosing.  Lipid check today. 

## 2024-02-18 ENCOUNTER — Ambulatory Visit: Payer: Self-pay | Admitting: Nurse Practitioner

## 2024-02-18 DIAGNOSIS — R7989 Other specified abnormal findings of blood chemistry: Secondary | ICD-10-CM

## 2024-02-18 LAB — COMPREHENSIVE METABOLIC PANEL WITH GFR
ALT: 93 IU/L — ABNORMAL HIGH (ref 0–44)
AST: 41 IU/L — ABNORMAL HIGH (ref 0–40)
Albumin: 4.3 g/dL (ref 3.8–4.8)
Alkaline Phosphatase: 78 IU/L (ref 44–121)
BUN/Creatinine Ratio: 21 (ref 10–24)
BUN: 32 mg/dL — ABNORMAL HIGH (ref 8–27)
Bilirubin Total: 0.8 mg/dL (ref 0.0–1.2)
CO2: 21 mmol/L (ref 20–29)
Calcium: 9.3 mg/dL (ref 8.6–10.2)
Chloride: 98 mmol/L (ref 96–106)
Creatinine, Ser: 1.56 mg/dL — ABNORMAL HIGH (ref 0.76–1.27)
Globulin, Total: 2 g/dL (ref 1.5–4.5)
Glucose: 195 mg/dL — ABNORMAL HIGH (ref 70–99)
Potassium: 4.5 mmol/L (ref 3.5–5.2)
Sodium: 138 mmol/L (ref 134–144)
Total Protein: 6.3 g/dL (ref 6.0–8.5)
eGFR: 47 mL/min/1.73 — ABNORMAL LOW (ref >59)

## 2024-02-18 LAB — LIPID PANEL W/O CHOL/HDL RATIO
Cholesterol, Total: 114 mg/dL (ref 100–199)
HDL: 57 mg/dL (ref >39)
LDL Chol Calc (NIH): 40 mg/dL (ref 0–99)
Triglycerides: 88 mg/dL (ref 0–149)
VLDL Cholesterol Cal: 17 mg/dL (ref 5–40)

## 2024-02-18 NOTE — Progress Notes (Signed)
 Contacted via MyChart -- needs lab only visit in one week please  Good day Malyk, your labs have returned: - Kidney function continues to show Stage 3a kidney disease.  Ensure good water  intake daily and no Ibuprofen . - Liver function tests have trended up some, have you taken lots of Tylenol  or had any alcohol recently? If so reduce these.  I would like to recheck outpatient in one week please. - Lipid panel continues to show levels at goal.  Any questions? Keep being stellar!!  Thank you for allowing me to participate in your care.  I appreciate you. Kindest regards, Castin Donaghue

## 2024-02-22 NOTE — Progress Notes (Signed)
 Called patient to get scheduled for a one week lab only visit, Not able to leave a message.

## 2024-02-22 NOTE — Telephone Encounter (Signed)
 Copied from CRM #[id number]. Topic: Appointments - Appointment Scheduling >> Feb 22, 2024  1:41 PM Marissa P wrote: Patient/patient representative is calling to schedule an appointment. Refer to attachments for appointment information.  Patient called back for missed call, needing labs done it stated but he wants a callback because he does not feel he needs labs

## 2024-02-24 ENCOUNTER — Telehealth: Payer: Self-pay | Admitting: Nurse Practitioner

## 2024-02-24 NOTE — Telephone Encounter (Signed)
 Patient brought in Letter from [employer] asking provider to review and assist him in being reconsidered under the Medical review program. Patient request call back at [phone]

## 2024-02-25 NOTE — Telephone Encounter (Signed)
Letter given to provider to review

## 2024-02-26 NOTE — Telephone Encounter (Signed)
 Called and notified patient of providers message.

## 2024-03-01 ENCOUNTER — Other Ambulatory Visit

## 2024-03-02 ENCOUNTER — Other Ambulatory Visit

## 2024-03-02 DIAGNOSIS — R7989 Other specified abnormal findings of blood chemistry: Secondary | ICD-10-CM

## 2024-03-03 ENCOUNTER — Ambulatory Visit: Payer: Self-pay | Admitting: Nurse Practitioner

## 2024-03-03 LAB — HEPATIC FUNCTION PANEL
ALT: 77 IU/L — ABNORMAL HIGH (ref 0–44)
AST: 42 IU/L — ABNORMAL HIGH (ref 0–40)
Albumin: 4.6 g/dL (ref 3.8–4.8)
Alkaline Phosphatase: 80 IU/L (ref 44–121)
Bilirubin Total: 0.8 mg/dL (ref 0.0–1.2)
Bilirubin, Direct: 0.35 mg/dL (ref 0.00–0.40)
Total Protein: 6.7 g/dL (ref 6.0–8.5)

## 2024-03-03 NOTE — Progress Notes (Signed)
 Good day, please let Andrew Cobb know his labs have returned.  Liver function testing is still a little elevated, but has come down some.  We will recheck next visit and if elevations continue we may get ultrasound to check on liver.  Any questions? Keep being amazing!!  Thank you for allowing me to participate in your care.  I appreciate you. Kindest regards, Sohan Potvin

## 2024-03-15 DIAGNOSIS — E119 Type 2 diabetes mellitus without complications: Secondary | ICD-10-CM | POA: Diagnosis not present

## 2024-03-15 DIAGNOSIS — H353122 Nonexudative age-related macular degeneration, left eye, intermediate dry stage: Secondary | ICD-10-CM | POA: Diagnosis not present

## 2024-03-15 DIAGNOSIS — H43821 Vitreomacular adhesion, right eye: Secondary | ICD-10-CM | POA: Diagnosis not present

## 2024-03-15 DIAGNOSIS — H353113 Nonexudative age-related macular degeneration, right eye, advanced atrophic without subfoveal involvement: Secondary | ICD-10-CM | POA: Diagnosis not present

## 2024-03-15 DIAGNOSIS — H353211 Exudative age-related macular degeneration, right eye, with active choroidal neovascularization: Secondary | ICD-10-CM | POA: Diagnosis not present

## 2024-03-15 DIAGNOSIS — H35721 Serous detachment of retinal pigment epithelium, right eye: Secondary | ICD-10-CM | POA: Diagnosis not present

## 2024-03-21 ENCOUNTER — Other Ambulatory Visit: Payer: Self-pay | Admitting: Nurse Practitioner

## 2024-03-21 DIAGNOSIS — R569 Unspecified convulsions: Secondary | ICD-10-CM

## 2024-03-21 DIAGNOSIS — E119 Type 2 diabetes mellitus without complications: Secondary | ICD-10-CM

## 2024-03-21 NOTE — Telephone Encounter (Unsigned)
 Copied from CRM #[id number]. Topic: Clinical - Medication Refill >> Mar 21, 2024  9:07 AM Mia F wrote: Medication: acyclovir  (ZOVIRAX ) 400 MG tablet  atorvastatin  (LIPITOR) 40 MG tablet  telmisartan  (MICARDIS ) 80 MG tablet   amitriptyline  (ELAVIL ) 25 MG tablet   fluticasone  (FLONASE ) 50 MCG/ACT nasal spray  levETIRAcetam  (KEPPRA ) 500 MG tablet  metFORMIN  (GLUCOPHAGE ) 1000 MG tablet  albuterol  (VENTOLIN  HFA) 108 (90 Base) MCG/ACT inhaler  tamsulosin  (FLOMAX ) 0.4 MG CAPS capsule  glucose blood (ONETOUCH ULTRA) test strip  Lancets (ONETOUCH ULTRASOFT) lancets   Has the patient contacted their pharmacy? Yes (Agent: If no, request that the patient contact the pharmacy for the refill. If patient does not wish to contact the pharmacy document the reason why and proceed with request.) (Agent: If yes, when and what did the pharmacy advise?)  This is the patient's preferred pharmacy:   [facility]) [place]  - [place], [place] - [address] [place] [address] Phone: [phone] Fax: [phone]   Is this the correct pharmacy for this prescription? Yes If no, delete pharmacy and type the correct one.   Has the prescription been filled recently? No  Is the patient out of the medication? No  Has the patient been seen for an appointment in the last year OR does the patient have an upcoming appointment? Yes  Can we respond through MyChart? Yes  Agent: Please be advised that Rx refills may take up to 3 business days. We ask that you follow-up with your pharmacy.

## 2024-03-22 MED ORDER — ONETOUCH ULTRASOFT LANCETS MISC: 8 refills | Status: AC

## 2024-03-22 MED ORDER — AMITRIPTYLINE HCL 25 MG PO TABS: 25.0000 mg | ORAL_TABLET | Freq: Every day | ORAL | 4 refills | Status: AC

## 2024-03-22 MED ORDER — ALBUTEROL SULFATE HFA 108 (90 BASE) MCG/ACT IN AERS: 2.0000 | INHALATION_SPRAY | Freq: Four times a day (QID) | RESPIRATORY_TRACT | 4 refills | Status: AC | PRN

## 2024-03-22 MED ORDER — ATORVASTATIN CALCIUM 40 MG PO TABS: 40.0000 mg | ORAL_TABLET | Freq: Every day | ORAL | 4 refills | Status: AC

## 2024-03-22 MED ORDER — ACYCLOVIR 400 MG PO TABS: 400.0000 mg | ORAL_TABLET | Freq: Two times a day (BID) | ORAL | 4 refills | Status: AC

## 2024-03-22 MED ORDER — LEVETIRACETAM 500 MG PO TABS: 500.0000 mg | ORAL_TABLET | Freq: Two times a day (BID) | ORAL | 4 refills | Status: DC

## 2024-03-22 MED ORDER — FLUTICASONE PROPIONATE 50 MCG/ACT NA SUSP: 2.0000 | Freq: Every day | NASAL | 4 refills | Status: AC

## 2024-03-22 MED ORDER — TAMSULOSIN HCL 0.4 MG PO CAPS: 0.4000 mg | ORAL_CAPSULE | Freq: Every day | ORAL | 4 refills | Status: AC

## 2024-03-22 MED ORDER — ONETOUCH ULTRA VI STRP: ORAL_STRIP | 4 refills | Status: AC

## 2024-03-22 MED ORDER — METFORMIN HCL 1000 MG PO TABS: 1000.0000 mg | ORAL_TABLET | Freq: Two times a day (BID) | ORAL | 4 refills | Status: AC

## 2024-03-22 NOTE — Telephone Encounter (Signed)
 Pt requesting change to mail order  Requested Prescriptions  Pending Prescriptions Disp Refills   acyclovir  (ZOVIRAX ) 400 MG tablet 180 tablet 4    Sig: Take 1 tablet (400 mg total) by mouth 2 (two) times daily.     Antimicrobials:  Antiviral Agents - Anti-Herpetic Passed - 03/22/2024  2:25 PM      Passed - Valid encounter within last 12 months    Recent Outpatient Visits           1 month ago Uncontrolled type 2 diabetes mellitus with hyperglycemia, without long-term current use of insulin  (HCC)   [place] [facility] [place], [place] T, NP   4 months ago Uncontrolled type 2 diabetes mellitus with hyperglycemia, without long-term current use of insulin  (HCC)   [place] [place], [place] T, NP   7 months ago Uncontrolled type 2 diabetes mellitus with hyperglycemia, without long-term current use of insulin  (HCC)   [place] [facility] [place], Jolene T, NP               atorvastatin  (LIPITOR) 40 MG tablet 90 tablet 4    Sig: Take 1 tablet (40 mg total) by mouth at bedtime.     Cardiovascular:  Antilipid - Statins Failed - 03/22/2024  2:25 PM      Failed - Lipid Panel in normal range within the last 12 months    Cholesterol, Total  Date Value Ref Range Status  02/17/2024 114 100 - 199 mg/dL Final   Cholesterol Piccolo, Waived  Date Value Ref Range Status  01/25/2018 111 <200 mg/dL Final    Comment:                            Desirable                <200                         Borderline High      200- 239                         High                     >239    LDL Chol Calc (NIH)  Date Value Ref Range Status  02/17/2024 40 0 - 99 mg/dL Final   HDL  Date Value Ref Range Status  02/17/2024 57 >39 mg/dL Final   Triglycerides  Date Value Ref Range Status  02/17/2024 88 0 - 149 mg/dL Final   Triglycerides Piccolo,Waived  Date Value Ref Range Status  01/25/2018 150 (H) <150 mg/dL Final    Comment:                             Normal                   <150                         Borderline High     150 - 199                         High                200 -  499                         Very High                >499          Passed - Patient is not pregnant      Passed - Valid encounter within last 12 months    Recent Outpatient Visits           1 month ago Uncontrolled type 2 diabetes mellitus with hyperglycemia, without long-term current use of insulin  (HCC)   [place] [facility] [place], [place] T, NP   4 months ago Uncontrolled type 2 diabetes mellitus with hyperglycemia, without long-term current use of insulin  (HCC)   [place] [facility] [place], Jolene T, NP   7 months ago Uncontrolled type 2 diabetes mellitus with hyperglycemia, without long-term current use of insulin  (HCC)   [place] [facility] [place], [place] T, NP               tamsulosin  (FLOMAX ) 0.4 MG CAPS capsule 60 capsule 4    Sig: Take 2 capsules (0.8 mg total) by mouth daily after supper.     Urology: Alpha-Adrenergic Blocker Failed - 03/22/2024  2:25 PM      Failed - PSA in normal range and within 360 days    Prostate Specific Ag, Serum  Date Value Ref Range Status  06/21/2019 7.0 (H) 0.0 - 4.0 ng/mL Final    Comment:    Roche ECLIA methodology. According to the American Urological Association, Serum PSA should decrease and remain at undetectable levels after radical prostatectomy. The AUA defines biochemical recurrence as an initial PSA value 0.2 ng/mL or greater followed by a subsequent confirmatory PSA value 0.2 ng/mL or greater. Values obtained with different assay methods or kits cannot be used interchangeably. Results cannot be interpreted as absolute evidence of the presence or absence of malignant disease.          Passed - Last BP in normal range    BP Readings from Last 1 Encounters:  02/17/24 112/73         Passed - Valid encounter within  last 12 months    Recent Outpatient Visits           1 month ago Uncontrolled type 2 diabetes mellitus with hyperglycemia, without long-term current use of insulin  (HCC)   [facility] [place], [place] T, NP   4 months ago Uncontrolled type 2 diabetes mellitus with hyperglycemia, without long-term current use of insulin  (HCC)   [facility] [place], [place] T, NP   7 months ago Uncontrolled type 2 diabetes mellitus with hyperglycemia, without long-term current use of insulin  (HCC)   [place] [facility] [place], Jolene T, NP               amitriptyline  (ELAVIL ) 25 MG tablet 90 tablet 4    Sig: Take 1 tablet (25 mg total) by mouth at bedtime.     Psychiatry:  Antidepressants - Heterocyclics (TCAs) Passed - 03/22/2024  2:25 PM      Passed - Valid encounter within last 6 months    Recent Outpatient Visits           1 month ago Uncontrolled type 2 diabetes mellitus with hyperglycemia, without long-term current use of insulin  [facility])   [facility] [place], [place] T,  NP   4 months ago Uncontrolled type 2 diabetes mellitus with hyperglycemia, without long-term current use of insulin  (HCC)   [facility] [place], [place] T, NP   7 months ago Uncontrolled type 2 diabetes mellitus with hyperglycemia, without long-term current use of insulin  (HCC)   [place] [facility] [place], Jolene T, NP               fluticasone  (FLONASE ) 50 MCG/ACT nasal spray 48 g 4    Sig: Place 2 sprays into both nostrils daily.     Ear, Nose, and Throat: Nasal Preparations - Corticosteroids Passed - 03/22/2024  2:25 PM      Passed - Valid encounter within last 12 months    Recent Outpatient Visits           1 month ago Uncontrolled type 2 diabetes mellitus with hyperglycemia, without long-term current use of insulin  (HCC)   [place] [facility] [place], [place] T, NP    4 months ago Uncontrolled type 2 diabetes mellitus with hyperglycemia, without long-term current use of insulin  (HCC)   [place] [facility] [place], Jolene T, NP   7 months ago Uncontrolled type 2 diabetes mellitus with hyperglycemia, without long-term current use of insulin  (HCC)   [place] [facility] [place], Jolene T, NP               levETIRAcetam  (KEPPRA ) 500 MG tablet 180 tablet 4    Sig: Take 1 tablet (500 mg total) by mouth 2 (two) times daily.     Neurology:  Anticonvulsants - levetiracetam  Failed - 03/22/2024  2:25 PM      Failed - Cr in normal range and within 360 days    Creatinine  Date Value Ref Range Status  07/29/2011 0.99 0.60 - 1.30 mg/dL Final   Creatinine, Ser  Date Value Ref Range Status  02/17/2024 1.56 (H) 0.76 - 1.27 mg/dL Final         Passed - Completed PHQ-2 or PHQ-9 in the last 360 days      Passed - Valid encounter within last 12 months    Recent Outpatient Visits           1 month ago Uncontrolled type 2 diabetes mellitus with hyperglycemia, without long-term current use of insulin  (HCC)   [place] [facility] [place], [place] T, NP   4 months ago Uncontrolled type 2 diabetes mellitus with hyperglycemia, without long-term current use of insulin  (HCC)   [place] [facility] [place], [place] T, NP   7 months ago Uncontrolled type 2 diabetes mellitus with hyperglycemia, without long-term current use of insulin  (HCC)   [facility] [place], [place] T, NP               albuterol  (VENTOLIN  HFA) 108 (90 Base) MCG/ACT inhaler 18 g 4    Sig: Inhale 2 puffs into the lungs every 6 (six) hours as needed for wheezing or shortness of breath.     Pulmonology:  Beta Agonists 2 Passed - 03/22/2024  2:25 PM      Passed - Last BP in normal range    BP Readings from Last 1 Encounters:  02/17/24 112/73         Passed - Last Heart Rate in normal range    Pulse  Readings from Last 1 Encounters:  02/17/24 80         Passed - Valid encounter within  last 12 months    Recent Outpatient Visits           1 month ago Uncontrolled type 2 diabetes mellitus with hyperglycemia, without long-term current use of insulin  (HCC)   [facility] [place], [place] T, NP   4 months ago Uncontrolled type 2 diabetes mellitus with hyperglycemia, without long-term current use of insulin  (HCC)   [place] [facility] [place], Jolene T, NP   7 months ago Uncontrolled type 2 diabetes mellitus with hyperglycemia, without long-term current use of insulin  (HCC)    [facility] [place] [place], Jolene T, NP               metFORMIN  (GLUCOPHAGE ) 1000 MG tablet 180 tablet 4    Sig: Take 1 tablet (1,000 mg total) by mouth 2 (two) times daily with a meal.     Endocrinology:  Diabetes - Biguanides Failed - 03/22/2024  2:25 PM      Failed - Cr in normal range and within 360 days    Creatinine  Date Value Ref Range Status  07/29/2011 0.99 0.60 - 1.30 mg/dL Final   Creatinine, Ser  Date Value Ref Range Status  02/17/2024 1.56 (H) 0.76 - 1.27 mg/dL Final         Failed - eGFR in normal range and within 360 days    EGFR (African American)  Date Value Ref Range Status  07/29/2011 >60 >5mL/min Final   GFR calc Af Amer  Date Value Ref Range Status  08/27/2020 67 >59 mL/min/1.73 Final    Comment:    **In accordance with recommendations from the NKF-ASN Task force,**   [facility] is in the process of updating its eGFR calculation to the   2021 CKD-EPI creatinine equation that estimates kidney function   without a race variable.    EGFR (Non-African Amer.)  Date Value Ref Range Status  07/29/2011 >60 >47mL/min Final    Comment:    eGFR values <32mL/min/1.73 m2 may be an indication of chronic kidney disease (CKD). Calculated eGFR, using the MRDR Study equation, is useful in  patients with stable renal function. The  eGFR calculation will not be reliable in acutely ill patients when serum creatinine is changing rapidly. It is not useful in patients on dialysis. The eGFR calculation may not be applicable to patients at the low and high extremes of body sizes, pregnant women, and vegetarians.    GFR, Estimated  Date Value Ref Range Status  12/11/2021 >60 >60 mL/min Final    Comment:    (NOTE) Calculated using the CKD-EPI Creatinine Equation (2021)    GFR  Date Value Ref Range Status  10/14/2019 49.88 (L) >60.00 mL/min Final   eGFR  Date Value Ref Range Status  02/17/2024 47 (L) >59 mL/min/1.73 Final         Failed - B12 Level in normal range and within 720 days    Vitamin B-12  Date Value Ref Range Status  05/06/2021 >2000 (H) 232 - 1245 pg/mL Final         Passed - HBA1C is between 0 and 7.9 and within 180 days    HB A1C (BAYER DCA - WAIVED)  Date Value Ref Range Status  02/17/2024 7.2 (H) 4.8 - 5.6 % Final    Comment:             Prediabetes: 5.7 - 6.4          Diabetes: >6.4  Glycemic control for adults with diabetes: <7.0          Passed - Valid encounter within last 6 months    Recent Outpatient Visits           1 month ago Uncontrolled type 2 diabetes mellitus with hyperglycemia, without long-term current use of insulin  (HCC)   [place] [place], [place] T, NP   4 months ago Uncontrolled type 2 diabetes mellitus with hyperglycemia, without long-term current use of insulin  (HCC)   [place] [facility] [place], Jolene T, NP   7 months ago Uncontrolled type 2 diabetes mellitus with hyperglycemia, without long-term current use of insulin  (HCC)   [facility] [place], [place] T, NP              Passed - CBC within normal limits and completed in the last 12 months    WBC  Date Value Ref Range Status  11/11/2023 4.7 3.4 - 10.8 x10E3/uL Final  10/05/2021 5.5 4.0 - 10.5 K/uL Final   RBC  Date Value  Ref Range Status  11/11/2023 4.48 4.14 - 5.80 x10E6/uL Final  10/05/2021 2.80 (L) 4.22 - 5.81 MIL/uL Final   Hemoglobin  Date Value Ref Range Status  11/11/2023 14.2 13.0 - 17.7 g/dL Final   Hematocrit  Date Value Ref Range Status  11/11/2023 43.3 37.5 - 51.0 % Final   MCHC  Date Value Ref Range Status  11/11/2023 32.8 31.5 - 35.7 g/dL Final  95/98/7976 68.7 30.0 - 36.0 g/dL Final   [facility]  Date Value Ref Range Status  11/11/2023 31.7 26.6 - 33.0 pg Final  10/05/2021 30.7 26.0 - 34.0 pg Final   MCV  Date Value Ref Range Status  11/11/2023 97 79 - 97 fL Final  07/29/2011 89 80 - 100 fL Final   No results found for: PLTCOUNTKUC, LABPLAT, POCPLA RDW  Date Value Ref Range Status  11/11/2023 11.5 (L) 11.6 - 15.4 % Final  07/29/2011 13.8 11.5 - 14.5 % Final          Lancets (ONETOUCH ULTRASOFT) lancets 100 each 8    Sig: Use as instructed     Endocrinology: Diabetes - Testing Supplies Passed - 03/22/2024  2:25 PM      Passed - Valid encounter within last 12 months    Recent Outpatient Visits           1 month ago Uncontrolled type 2 diabetes mellitus with hyperglycemia, without long-term current use of insulin  (HCC)   [place] [facility] [place], [place] T, NP   4 months ago Uncontrolled type 2 diabetes mellitus with hyperglycemia, without long-term current use of insulin  (HCC)   [place] [facility] [place], Jolene T, NP   7 months ago Uncontrolled type 2 diabetes mellitus with hyperglycemia, without long-term current use of insulin  (HCC)   [facility] [place], Jolene T, NP               glucose blood (ONETOUCH ULTRA) test strip 100 each 4    Sig: USE TO CHECK GLUCOSE TWICE DAILY. USE AS INSTRUCTED     Endocrinology: Diabetes - Testing Supplies Passed - 03/22/2024  2:25 PM      Passed - Valid encounter within last 12 months    Recent Outpatient Visits           1 month ago Uncontrolled type 2  diabetes mellitus with hyperglycemia, without long-term current use of  insulin  (HCC)   [facility] [place], [place] T, NP   4 months ago Uncontrolled type 2 diabetes mellitus with hyperglycemia, without long-term current use of insulin  (HCC)   [place] [place], [place] T, NP   7 months ago Uncontrolled type 2 diabetes mellitus with hyperglycemia, without long-term current use of insulin  [facility])   [place] [facility] [place], Melanie DASEN, NP

## 2024-03-22 NOTE — Telephone Encounter (Signed)
 Requested medication (s) are due for refill today: yes  Requested medication (s) are on the active medication list: yes  Last refill:  07/28/23  Future visit scheduled: no  Notes to clinic:  PSA out of date   Requested Prescriptions  Pending Prescriptions Disp Refills   tamsulosin  (FLOMAX ) 0.4 MG CAPS capsule 60 capsule 4    Sig: Take 2 capsules (0.8 mg total) by mouth daily after supper.     Urology: Alpha-Adrenergic Blocker Failed - 03/22/2024  2:25 PM      Failed - PSA in normal range and within 360 days    Prostate Specific Ag, Serum  Date Value Ref Range Status  06/21/2019 7.0 (H) 0.0 - 4.0 ng/mL Final    Comment:    Roche ECLIA methodology. According to the American Urological Association, Serum PSA should decrease and remain at undetectable levels after radical prostatectomy. The AUA defines biochemical recurrence as an initial PSA value 0.2 ng/mL or greater followed by a subsequent confirmatory PSA value 0.2 ng/mL or greater. Values obtained with different assay methods or kits cannot be used interchangeably. Results cannot be interpreted as absolute evidence of the presence or absence of malignant disease.          Passed - Last BP in normal range    BP Readings from Last 1 Encounters:  02/17/24 112/73         Passed - Valid encounter within last 12 months    Recent Outpatient Visits           1 month ago Uncontrolled type 2 diabetes mellitus with hyperglycemia, without long-term current use of insulin  (HCC)   [facility] [place], [place] T, NP   4 months ago Uncontrolled type 2 diabetes mellitus with hyperglycemia, without long-term current use of insulin  (HCC)   [place] [place], [place] T, NP   7 months ago Uncontrolled type 2 diabetes mellitus with hyperglycemia, without long-term current use of insulin  (HCC)   [facility] [place], Melanie T, NP              Signed  Prescriptions Disp Refills   acyclovir  (ZOVIRAX ) 400 MG tablet 180 tablet 4    Sig: Take 1 tablet (400 mg total) by mouth 2 (two) times daily.     Antimicrobials:  Antiviral Agents - Anti-Herpetic Passed - 03/22/2024  2:25 PM      Passed - Valid encounter within last 12 months    Recent Outpatient Visits           1 month ago Uncontrolled type 2 diabetes mellitus with hyperglycemia, without long-term current use of insulin  (HCC)   [facility] [place], [place] T, NP   4 months ago Uncontrolled type 2 diabetes mellitus with hyperglycemia, without long-term current use of insulin  (HCC)   [place] [facility] [place], [place] T, NP   7 months ago Uncontrolled type 2 diabetes mellitus with hyperglycemia, without long-term current use of insulin  (HCC)   [place] [facility] [place], Jolene T, NP               atorvastatin  (LIPITOR) 40 MG tablet 90 tablet 4    Sig: Take 1 tablet (40 mg total) by mouth at bedtime.     Cardiovascular:  Antilipid - Statins Failed - 03/22/2024  2:25 PM      Failed - Lipid Panel in normal range within the last 12 months  Cholesterol, Total  Date Value Ref Range Status  02/17/2024 114 100 - 199 mg/dL Final   Cholesterol [place], [place]  Date Value Ref Range Status  01/25/2018 111 <200 mg/dL Final    Comment:                            Desirable                <200                         Borderline High      200- 239                         High                     >239    LDL Chol Calc (NIH)  Date Value Ref Range Status  02/17/2024 40 0 - 99 mg/dL Final   HDL  Date Value Ref Range Status  02/17/2024 57 >39 mg/dL Final   Triglycerides  Date Value Ref Range Status  02/17/2024 88 0 - 149 mg/dL Final   Triglycerides Piccolo,Waived  Date Value Ref Range Status  01/25/2018 150 (H) <150 mg/dL Final    Comment:                            Normal                   <150                          Borderline High     150 - 199                         High                200 - 499                         Very High                >499          Passed - Patient is not pregnant      Passed - Valid encounter within last 12 months    Recent Outpatient Visits           1 month ago Uncontrolled type 2 diabetes mellitus with hyperglycemia, without long-term current use of insulin  (HCC)   [place] [facility] [place], [place] T, NP   4 months ago Uncontrolled type 2 diabetes mellitus with hyperglycemia, without long-term current use of insulin  (HCC)   [place] [facility] [place], Jolene T, NP   7 months ago Uncontrolled type 2 diabetes mellitus with hyperglycemia, without long-term current use of insulin  (HCC)   [place] [facility] [place], Jolene T, NP               amitriptyline  (ELAVIL ) 25 MG tablet 90 tablet 4    Sig: Take 1 tablet (25 mg total) by mouth at bedtime.     Psychiatry:  Antidepressants - Heterocyclics (TCAs) Passed - 03/22/2024  2:25 PM      Passed - Valid encounter within last 6 months  Recent Outpatient Visits           1 month ago Uncontrolled type 2 diabetes mellitus with hyperglycemia, without long-term current use of insulin  (HCC)   [place] [facility] [facility] [place], [place] T, NP   4 months ago Uncontrolled type 2 diabetes mellitus with hyperglycemia, without long-term current use of insulin  (HCC)   [place] [facility] [place], [place] T, NP   7 months ago Uncontrolled type 2 diabetes mellitus with hyperglycemia, without long-term current use of insulin  (HCC)   [place] [facility] [place], Jolene T, NP               fluticasone  (FLONASE ) 50 MCG/ACT nasal spray 48 g 4    Sig: Place 2 sprays into both nostrils daily.     Ear, Nose, and Throat: Nasal Preparations - Corticosteroids Passed - 03/22/2024  2:25 PM      Passed - Valid encounter within last 12  months    Recent Outpatient Visits           1 month ago Uncontrolled type 2 diabetes mellitus with hyperglycemia, without long-term current use of insulin  (HCC)   [facility] [facility] [place], [place] T, NP   4 months ago Uncontrolled type 2 diabetes mellitus with hyperglycemia, without long-term current use of insulin  (HCC)   [place] [facility] [place], Jolene T, NP   7 months ago Uncontrolled type 2 diabetes mellitus with hyperglycemia, without long-term current use of insulin  (HCC)   [place] [facility] [place], Jolene T, NP               levETIRAcetam  (KEPPRA ) 500 MG tablet 180 tablet 4    Sig: Take 1 tablet (500 mg total) by mouth 2 (two) times daily.     Neurology:  Anticonvulsants - levetiracetam  Failed - 03/22/2024  2:25 PM      Failed - Cr in normal range and within 360 days    Creatinine  Date Value Ref Range Status  07/29/2011 0.99 0.60 - 1.30 mg/dL Final   Creatinine, Ser  Date Value Ref Range Status  02/17/2024 1.56 (H) 0.76 - 1.27 mg/dL Final         Passed - Completed PHQ-2 or PHQ-9 in the last 360 days      Passed - Valid encounter within last 12 months    Recent Outpatient Visits           1 month ago Uncontrolled type 2 diabetes mellitus with hyperglycemia, without long-term current use of insulin  (HCC)   [place] [facility] [place], [place] T, NP   4 months ago Uncontrolled type 2 diabetes mellitus with hyperglycemia, without long-term current use of insulin  (HCC)   [place] [facility] [place], [place] T, NP   7 months ago Uncontrolled type 2 diabetes mellitus with hyperglycemia, without long-term current use of insulin  (HCC)    [facility] [place], [place] T, NP               albuterol  (VENTOLIN  HFA) 108 (90 Base) MCG/ACT inhaler 18 g 4    Sig: Inhale 2 puffs into the lungs every 6 (six) hours as needed for wheezing or shortness of breath.      Pulmonology:  Beta Agonists 2 Passed - 03/22/2024  2:25 PM      Passed - Last BP in normal range    BP Readings from Last 1 Encounters:  02/17/24 112/73  Passed - Last Heart Rate in normal range    Pulse Readings from Last 1 Encounters:  02/17/24 80         Passed - Valid encounter within last 12 months    Recent Outpatient Visits           1 month ago Uncontrolled type 2 diabetes mellitus with hyperglycemia, without long-term current use of insulin  (HCC)   [place] [place], [place] T, NP   4 months ago Uncontrolled type 2 diabetes mellitus with hyperglycemia, without long-term current use of insulin  (HCC)   [place] [facility] [place], Jolene T, NP   7 months ago Uncontrolled type 2 diabetes mellitus with hyperglycemia, without long-term current use of insulin  (HCC)   [place]  [facility] [place], Jolene T, NP               metFORMIN  (GLUCOPHAGE ) 1000 MG tablet 180 tablet 4    Sig: Take 1 tablet (1,000 mg total) by mouth 2 (two) times daily with a meal.     Endocrinology:  Diabetes - Biguanides Failed - 03/22/2024  2:25 PM      Failed - Cr in normal range and within 360 days    Creatinine  Date Value Ref Range Status  07/29/2011 0.99 0.60 - 1.30 mg/dL Final   Creatinine, Ser  Date Value Ref Range Status  02/17/2024 1.56 (H) 0.76 - 1.27 mg/dL Final         Failed - eGFR in normal range and within 360 days    EGFR (African American)  Date Value Ref Range Status  07/29/2011 >60 >52mL/min Final   GFR calc Af Amer  Date Value Ref Range Status  08/27/2020 67 >59 mL/min/1.73 Final    Comment:    **In accordance with recommendations from the NKF-ASN Task force,**   [facility] is in the process of updating its eGFR calculation to the   2021 CKD-EPI creatinine equation that estimates kidney function   without a race variable.    EGFR (Non-African Amer.)  Date Value Ref Range Status  07/29/2011 >60  >44mL/min Final    Comment:    eGFR values <33mL/min/1.73 m2 may be an indication of chronic kidney disease (CKD). Calculated eGFR, using the MRDR Study equation, is useful in  patients with stable renal function. The eGFR calculation will not be reliable in acutely ill patients when serum creatinine is changing rapidly. It is not useful in patients on dialysis. The eGFR calculation may not be applicable to patients at the low and high extremes of body sizes, pregnant women, and vegetarians.    GFR, Estimated  Date Value Ref Range Status  12/11/2021 >60 >60 mL/min Final    Comment:    (NOTE) Calculated using the CKD-EPI Creatinine Equation (2021)    GFR  Date Value Ref Range Status  10/14/2019 49.88 (L) >60.00 mL/min Final   eGFR  Date Value Ref Range Status  02/17/2024 47 (L) >59 mL/min/1.73 Final         Failed - B12 Level in normal range and within 720 days    Vitamin B-12  Date Value Ref Range Status  05/06/2021 >2000 (H) 232 - 1245 pg/mL Final         Passed - HBA1C is between 0 and 7.9 and within 180 days    HB A1C (BAYER DCA - WAIVED)  Date Value Ref Range Status  02/17/2024 7.2 (H) 4.8 - 5.6 % Final    Comment:  Prediabetes: 5.7 - 6.4          Diabetes: >6.4          Glycemic control for adults with diabetes: <7.0          Passed - Valid encounter within last 6 months    Recent Outpatient Visits           1 month ago Uncontrolled type 2 diabetes mellitus with hyperglycemia, without long-term current use of insulin  (HCC)   [place] [facility] [place], [place] T, NP   4 months ago Uncontrolled type 2 diabetes mellitus with hyperglycemia, without long-term current use of insulin  (HCC)   [place] [facility] [place], [place] T, NP   7 months ago Uncontrolled type 2 diabetes mellitus with hyperglycemia, without long-term current use of insulin  (HCC)   [facility] [place], [place] T, NP               Passed - CBC within normal limits and completed in the last 12 months    WBC  Date Value Ref Range Status  11/11/2023 4.7 3.4 - 10.8 x10E3/uL Final  10/05/2021 5.5 4.0 - 10.5 K/uL Final   RBC  Date Value Ref Range Status  11/11/2023 4.48 4.14 - 5.80 x10E6/uL Final  10/05/2021 2.80 (L) 4.22 - 5.81 MIL/uL Final   Hemoglobin  Date Value Ref Range Status  11/11/2023 14.2 13.0 - 17.7 g/dL Final   Hematocrit  Date Value Ref Range Status  11/11/2023 43.3 37.5 - 51.0 % Final   MCHC  Date Value Ref Range Status  11/11/2023 32.8 31.5 - 35.7 g/dL Final  95/98/7976 68.7 30.0 - 36.0 g/dL Final   [facility]  Date Value Ref Range Status  11/11/2023 31.7 26.6 - 33.0 pg Final  10/05/2021 30.7 26.0 - 34.0 pg Final   MCV  Date Value Ref Range Status  11/11/2023 97 79 - 97 fL Final  07/29/2011 89 80 - 100 fL Final   No results found for: PLTCOUNTKUC, LABPLAT, POCPLA RDW  Date Value Ref Range Status  11/11/2023 11.5 (L) 11.6 - 15.4 % Final  07/29/2011 13.8 11.5 - 14.5 % Final          Lancets (ONETOUCH ULTRASOFT) lancets 100 each 8    Sig: Use as instructed     Endocrinology: Diabetes - Testing Supplies Passed - 03/22/2024  2:25 PM      Passed - Valid encounter within last 12 months    Recent Outpatient Visits           1 month ago Uncontrolled type 2 diabetes mellitus with hyperglycemia, without long-term current use of insulin  (HCC)   [place] [facility] [place], [place] T, NP   4 months ago Uncontrolled type 2 diabetes mellitus with hyperglycemia, without long-term current use of insulin  (HCC)   [place] [facility] [place], Jolene T, NP   7 months ago Uncontrolled type 2 diabetes mellitus with hyperglycemia, without long-term current use of insulin  (HCC)   [place] [facility] [place], Jolene T, NP               glucose blood (ONETOUCH ULTRA) test strip 100 each 4    Sig: USE TO CHECK GLUCOSE TWICE DAILY. USE  AS INSTRUCTED     Endocrinology: Diabetes - Testing Supplies Passed - 03/22/2024  2:25 PM      Passed - Valid encounter within last 12 months    Recent Outpatient Visits  1 month ago Uncontrolled type 2 diabetes mellitus with hyperglycemia, without long-term current use of insulin  (HCC)   [place] [facility] [place], [place] T, NP   4 months ago Uncontrolled type 2 diabetes mellitus with hyperglycemia, without long-term current use of insulin  (HCC)   [place] [place], [place] T, NP   7 months ago Uncontrolled type 2 diabetes mellitus with hyperglycemia, without long-term current use of insulin  [facility])   [place] [facility] [place], Melanie DASEN, NP

## 2024-03-24 ENCOUNTER — Telehealth: Payer: Self-pay

## 2024-03-24 NOTE — Telephone Encounter (Signed)
 Copied from CRM [id number]. Topic: Clinical - Medication Question >> Mar 24, 2024  9:45 AM Treva T wrote: Reason for CRM: Received call from Bridgette, with [facility], calling regarding prescription for Lancets [facility] ULTRASOFT) lancets.  Per pharmacy, needs specific directions on how many times lancets are to be used to test, per insurance in order to cover.  Can follow up at [phone].   [facility]) [place]  - [place], [place] - [address] [place] [address] Phone: [phone] Fax: [phone]

## 2024-03-24 NOTE — Telephone Encounter (Signed)
 Assume once daily since he is not using insulin ?

## 2024-03-28 NOTE — Telephone Encounter (Signed)
 Bridgette from [facility] called to find out how many time patient would test using the lancets. Per provider note it is once daily

## 2024-03-29 ENCOUNTER — Encounter: Payer: Self-pay | Admitting: Nurse Practitioner

## 2024-03-29 MED ORDER — TELMISARTAN 80 MG PO TABS: 80.0000 mg | ORAL_TABLET | Freq: Every day | ORAL | 4 refills | Status: AC

## 2024-03-29 NOTE — Telephone Encounter (Signed)
 No further action

## 2024-05-02 DIAGNOSIS — H353122 Nonexudative age-related macular degeneration, left eye, intermediate dry stage: Secondary | ICD-10-CM | POA: Diagnosis not present

## 2024-05-02 DIAGNOSIS — E119 Type 2 diabetes mellitus without complications: Secondary | ICD-10-CM | POA: Diagnosis not present

## 2024-05-02 DIAGNOSIS — H353211 Exudative age-related macular degeneration, right eye, with active choroidal neovascularization: Secondary | ICD-10-CM | POA: Diagnosis not present

## 2024-05-02 DIAGNOSIS — H35721 Serous detachment of retinal pigment epithelium, right eye: Secondary | ICD-10-CM | POA: Diagnosis not present

## 2024-05-02 DIAGNOSIS — H353113 Nonexudative age-related macular degeneration, right eye, advanced atrophic without subfoveal involvement: Secondary | ICD-10-CM | POA: Diagnosis not present

## 2024-05-02 DIAGNOSIS — H43821 Vitreomacular adhesion, right eye: Secondary | ICD-10-CM | POA: Diagnosis not present

## 2024-05-03 ENCOUNTER — Ambulatory Visit (INDEPENDENT_AMBULATORY_CARE_PROVIDER_SITE_OTHER)

## 2024-05-03 DIAGNOSIS — Z23 Encounter for immunization: Secondary | ICD-10-CM | POA: Diagnosis not present
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
## 2024-05-05 ENCOUNTER — Ambulatory Visit: Payer: PPO | Admitting: Adult Health

## 2024-05-05 ENCOUNTER — Encounter: Payer: Self-pay | Admitting: Adult Health

## 2024-05-05 VITALS — BP 140/82 | HR 65 | Ht 70.0 in | Wt 200.8 lb

## 2024-05-05 DIAGNOSIS — G4733 Obstructive sleep apnea (adult) (pediatric): Secondary | ICD-10-CM

## 2024-05-05 DIAGNOSIS — R569 Unspecified convulsions: Secondary | ICD-10-CM | POA: Diagnosis not present

## 2024-05-05 DIAGNOSIS — I639 Cerebral infarction, unspecified: Secondary | ICD-10-CM

## 2024-05-05 DIAGNOSIS — I69398 Other sequelae of cerebral infarction: Secondary | ICD-10-CM

## 2024-05-05 DIAGNOSIS — T887XXA Unspecified adverse effect of drug or medicament, initial encounter: Secondary | ICD-10-CM

## 2024-05-05 MED ORDER — LEVETIRACETAM ER 500 MG PO TB24: 1000.0000 mg | ORAL_TABLET | Freq: Every day | ORAL | 3 refills | Status: DC

## 2024-05-05 NOTE — Progress Notes (Signed)
 SABRA

## 2024-05-05 NOTE — Patient Instructions (Addendum)
 Will switch you to Keppra  XR (extended release) 1000mg  nightly - this can help reduce mood side effects of regular keppra   If mood changes persist, would recommend switching you to Briviact which comes with less mood side effects   Please call with any seizure activity   Continue nightly use of CPAP for adequate sleep apnea management   Continue to follow with DME [employer] for any needed supplies or CPAP related concerns     Follow up in 1 year or call earlier if needed

## 2024-05-06 ENCOUNTER — Telehealth: Payer: Self-pay | Admitting: Adult Health

## 2024-05-06 NOTE — Telephone Encounter (Signed)
 Pt's wife called wanting to speak to the nurse regarding the Briviact that was prescribed to the pt. She states it is to expensive for them and she is wanting to know if there is something else he can take instead of that. Please advise.

## 2024-05-06 NOTE — Telephone Encounter (Signed)
 Call to pt/wife, no answer. Left message and Advised that harlene had mentioned another medication if brivact not covered. Advised will reach out when jessica reviews and places new order. Advised to call in the mean time with questions or concerns.

## 2024-05-08 DIAGNOSIS — M5416 Radiculopathy, lumbar region: Secondary | ICD-10-CM | POA: Diagnosis not present

## 2024-05-09 NOTE — Telephone Encounter (Signed)
 Pt wife called stating that  Np wanted to changed  pt medication due  TO price. Pt wife called to recommend Pt be place on  Lamotrigine  Pharmacy  [facility] [address] [place]), [place] - [address] Phone: [phone]  Fax: [phone]

## 2024-05-09 NOTE — Telephone Encounter (Signed)
Wife is requesting 90 day supply.

## 2024-05-09 NOTE — Telephone Encounter (Signed)
 This was discussed at recent visit and due to the prolonged time to titrate from Keppra  to lamotrigine (takes 5 weeks to get to therapeutic dose of lamotrigine and then will have to gradually taper off Keppra ), we were going to wait until after the holiday season as there can be an increased risk of breakthrough seizures while transitioning medications. If they are wanting to go ahead with this transition now, I will place order but please verify this is what they are wanting to do. Thank you.

## 2024-05-10 ENCOUNTER — Other Ambulatory Visit: Payer: Self-pay | Admitting: *Deleted

## 2024-05-10 DIAGNOSIS — R569 Unspecified convulsions: Secondary | ICD-10-CM

## 2024-05-10 DIAGNOSIS — T887XXA Unspecified adverse effect of drug or medicament, initial encounter: Secondary | ICD-10-CM

## 2024-05-10 MED ORDER — LAMOTRIGINE 25 MG PO TABS: ORAL_TABLET | ORAL | 0 refills | Status: DC

## 2024-05-10 MED ORDER — LEVETIRACETAM 500 MG PO TABS: 500.0000 mg | ORAL_TABLET | Freq: Two times a day (BID) | ORAL | 0 refills | Status: DC

## 2024-05-10 NOTE — Telephone Encounter (Signed)
 Please advise on titration schedule to initiate lamotrigine as noted below. Once he completes week 5, request he return to office at week 6 to check lamotrigine level and CMP. If satisfactory, will start to reduce his keppra  dosage. Please continue to take keppra  for now. Please clarify if he continued with keppra  IR 500mg  BID or did he switch to Keppra  XR 1000 mg daily. Thank you.   Week 1: 25mg  (one pill) at night  Week 2: 25mg  (one pill) twice daily Week 3: 50mg  (two pills) twice daily  Week 4: 75mg  (three pills) twice daily Week 5: 100mg  (fours pills) twice daily

## 2024-05-10 NOTE — Telephone Encounter (Addendum)
 I called pt and spoke to wife.  She had titration schedule for lamotrigine.  Pt is to take keppra  IR 500mg  po bid while on titration and at 6wks will have lab work done. From results will then taper keppra .  Scheduled 06-22-2024 at 0800 trough level. Mychart message sent to pt.  Verbalized understanding.

## 2024-05-10 NOTE — Telephone Encounter (Signed)
 Call to wife, they would like to go ahead and start the lamictal. She is aware if he develops a rash to stop medication and let us  know. She is also aware of increased risk for breakthrough seizure. I explained that the  titration would happen over 5 weeks and to follow instructions given on label. Continue Keppra  unitl further instructions given on that. Wife verbalized understanding.

## 2024-05-10 NOTE — Addendum Note (Signed)
 Addended by: NEYSA NENA RAMAN on: 05/10/2024 05:15 PM   Modules accepted: Orders

## 2024-05-16 DIAGNOSIS — R0789 Other chest pain: Secondary | ICD-10-CM | POA: Diagnosis not present

## 2024-05-19 ENCOUNTER — Other Ambulatory Visit: Payer: Self-pay | Admitting: Orthopedic Surgery

## 2024-05-19 ENCOUNTER — Ambulatory Visit: Admitting: Nurse Practitioner

## 2024-05-19 DIAGNOSIS — M5416 Radiculopathy, lumbar region: Secondary | ICD-10-CM

## 2024-05-20 ENCOUNTER — Ambulatory Visit: Payer: Self-pay

## 2024-05-20 ENCOUNTER — Ambulatory Visit: Admitting: Nurse Practitioner

## 2024-05-20 ENCOUNTER — Encounter: Payer: Self-pay | Admitting: Pediatrics

## 2024-05-20 ENCOUNTER — Ambulatory Visit: Admitting: Pediatrics

## 2024-05-20 VITALS — BP 124/74 | HR 78 | Temp 97.3°F | Ht 70.0 in | Wt 201.6 lb

## 2024-05-20 DIAGNOSIS — J189 Pneumonia, unspecified organism: Secondary | ICD-10-CM

## 2024-05-20 MED ORDER — AZITHROMYCIN 250 MG PO TABS: ORAL_TABLET | ORAL | 0 refills | Status: AC

## 2024-05-20 MED ORDER — PREDNISONE 20 MG PO TABS: 40.0000 mg | ORAL_TABLET | Freq: Every day | ORAL | 0 refills | Status: AC

## 2024-05-20 NOTE — Progress Notes (Signed)
 Patient for DG Lumbar Myelogram Inj/CT Lumbar Myelogram on Mon 05/23/24, I called and spoke with the patient's wife, [name] on the phone and gave pre-procedure instructions. [name] was made aware to have the patient here at 8:30a and check in at the H&V entrance. [name] stated understanding.  Called 05/20/24

## 2024-05-20 NOTE — Progress Notes (Signed)
 Office Visit  BP 124/74   Pulse 78   Temp (!) 97.3 F (36.3 C) (Oral)   Ht 5' 10 (1.778 m)   Wt 201 lb 9.6 oz (91.4 kg)   SpO2 98%   BMI 28.93 kg/m    Subjective:    Patient ID: Andrew Cobb, male    DOB: 09/20/1950, 73 y.o.   MRN: [id number]  HPI: Andrew Cobb is a 73 y.o. male  Chief Complaint  Patient presents with   Cough    Onset about 7 days ago. Denies fever. Using Tylenol , aleve, and cough med. Not much relief from OTC meds   Nasal Congestion    Discussed the use of AI scribe software for clinical note transcription with the patient, who gave verbal consent to proceed.  History of Present Illness   Andrew Cobb is a 73 year old male with asthma who presents with a cough and green-yellow sputum production.  He has been experiencing symptoms for about seven days, with the main issue being the production of green and yellow sputum. He describes it as 'gunk just coming out and everything.'  He has been using his inhaler more frequently and also uses Flonase . Despite these measures, there is no improvement in his symptoms. He has also tried Tessalon  pearls without relief.  He mentions having a CT scan scheduled for his back on Monday due to pain he has been experiencing for about two months.  He received a flu shot approximately three weeks ago.      Relevant past medical, surgical, family and social history reviewed and updated as indicated. Interim medical history since our last visit reviewed. Allergies and medications reviewed and updated.  ROS per HPI unless specifically indicated above     Objective:    BP 124/74   Pulse 78   Temp (!) 97.3 F (36.3 C) (Oral)   Ht 5' 10 (1.778 m)   Wt 201 lb 9.6 oz (91.4 kg)   SpO2 98%   BMI 28.93 kg/m   Wt Readings from Last 3 Encounters:  05/20/24 201 lb 9.6 oz (91.4 kg)  05/05/24 200 lb 12.8 oz (91.1 kg)  02/17/24 196 lb 6.4 oz (89.1 kg)     Physical Exam Constitutional:      General: He is  not in acute distress.    Appearance: Normal appearance. He is ill-appearing.  HENT:     Nose: Congestion present.  Cardiovascular:     Rate and Rhythm: Normal rate and regular rhythm.  Pulmonary:     Effort: Pulmonary effort is normal.     Breath sounds: Rales present.     Comments: Left upper lung field w rales o/w clear Musculoskeletal:        General: Normal range of motion.  Skin:    Comments: Normal skin color  Neurological:     General: No focal deficit present.     Mental Status: He is alert. Mental status is at baseline.  Psychiatric:        Mood and Affect: Mood normal.        Behavior: Behavior normal.        Thought Content: Thought content normal.         11/17/2023    8:07 AM 11/11/2023    9:25 AM 10/27/2022   11:37 AM 11/01/2021    9:11 AM 07/24/2021   10:42 AM  Depression screen PHQ 2/9  Decreased Interest 0 0 0 0 0  Down, Depressed,  Hopeless 0 0 0 0 0  PHQ - 2 Score 0 0 0 0 0  Altered sleeping 0 0 0 0   Tired, decreased energy 0 0 0 0   Change in appetite 0 0 0 0   Feeling bad or failure about yourself  0 0 0 0   Trouble concentrating 0 0 0 0   Moving slowly or fidgety/restless 0 0 0 0   Suicidal thoughts 0 0 0 0   PHQ-9 Score 0  0  0  0    Difficult doing work/chores Not difficult at all Not difficult at all Not difficult at all Not difficult at all      Data saved with a previous flowsheet row definition       11/01/2021    9:12 AM  GAD 7 : Generalized Anxiety Score  Nervous, Anxious, on Edge 0  Control/stop worrying 0  Worry too much - different things 0  Trouble relaxing 0  Restless 0  Easily annoyed or irritable 0  Afraid - awful might happen 0  Total GAD 7 Score 0  Anxiety Difficulty Not difficult at all       Assessment & Plan:  Assessment & Plan   Atypical pneumonia Symptoms suggest atypical pneumonia, suspect underlying asthma contributing. Increased inhaler use. Exam notable for rales prominent in left upper lung. No wheeze.  -  Prescribed steroids and azithromycin  (Z-Pak). - Advised continued use of Tessalon , acetaminophen , and ibuprofen  as needed. - Instructed follow-up if no improvement next week. -     predniSONE ; Take 2 tablets (40 mg total) by mouth daily with breakfast for 5 days.  Dispense: 10 tablet; Refill: 0 -     Azithromycin ; Take 2 tablets on day 1, then 1 tablet daily on days 2 through 5  Dispense: 6 tablet; Refill: 0   Follow up plan: Return if symptoms worsen or fail to improve.  Hadassah SHAUNNA Nett, MD  Approximately 30 minutes spent on patient encounter today including assessment, counseling, diagnosing, treatment plan development, and charting.

## 2024-05-20 NOTE — Telephone Encounter (Signed)
 On schedule this afternoon with Dr. Herold.

## 2024-05-20 NOTE — Telephone Encounter (Signed)
 Duplicate encounter  Copied from CRM #[id number]. Topic: Clinical - Red Word Triage >> May 20, 2024  8:21 AM Rosaria BRAVO wrote: Red Word that prompted transfer to Nurse Triage: Coughing up green mucous, hoarse, congested, cough.   ----------------------------------------------------------------------- From previous Reason for Contact - Scheduling: Patient/patient representative is calling to schedule an appointment. Refer to attachments for appointment information. >> May 20, 2024  8:35 AM Rosaria E wrote: Was instructed to change this to a PINK word

## 2024-05-20 NOTE — Telephone Encounter (Signed)
 FYI Only or Action Required?: Action required by provider: request for appointment and clinical question for provider.  Patient was last seen in primary care on 02/17/2024 by Valerio Melanie DASEN, NP.  Called Nurse Triage reporting No chief complaint on file..  Symptoms began several days ago.  Interventions attempted: OTC medications: Ibuprofen , Tylenol .  Symptoms are: gradually worsening.  Triage Disposition: See Physician Within 24 Hours  Patient/caregiver understands and will follow disposition?: Yes Reason for Disposition  [1] Continuous (nonstop) coughing interferes with work or school AND [2] no improvement using cough treatment per Care Advice  Answer Assessment - Initial Assessment Questions 1. ONSET: When did the cough begin?      3 Days ago  2. SEVERITY: How bad is the cough today?      Constant  3. SPUTUM: Describe the color of your sputum (e.g., none, dry cough; clear, white, yellow, green)     Green  4. HEMOPTYSIS: Are you coughing up any blood? If Yes, ask: How much? (e.g., flecks, streaks, tablespoons, etc.)     No  5. DIFFICULTY BREATHING: Are you having difficulty breathing? If Yes, ask: How bad is it? (e.g., mild, moderate, severe)      No  6. FEVER: Do you have a fever? If Yes, ask: What is your temperature, how was it measured, and when did it start?     Unsure  7. CARDIAC HISTORY: Do you have any history of heart disease? (e.g., heart attack, congestive heart failure)      Hypertension  8. LUNG HISTORY: Do you have any history of lung disease?  (e.g., pulmonary embolus, asthma, emphysema)     Asthma  9. PE RISK FACTORS: Do you have a history of blood clots? (or: recent major surgery, recent prolonged travel, bedridden)     No  10. OTHER SYMPTOMS: Do you have any other symptoms? (e.g., runny nose, wheezing, chest pain)       Runny Nose, Wheezing   12. TRAVEL: Have you traveled out of the country in the last month? (e.g.,  travel history, exposures)       No  Protocols used: Cough - Acute Productive-A-AH

## 2024-05-20 NOTE — Patient Instructions (Addendum)
 Most cold symptoms last up to 2 weeks, but cough can sometimes linger up to 4 weeks.  However if your symtpoms get WORSE - like you develop fevers or get more shortness of breath, then call your clinic as you may need to be evaluated.   Aches and Pains Acetaminophen (Tylenol): 1000mg  ("extra strength" tablets are 500mg , so take 2) every 8 hours if needed  Ibuprofen (Advil/Motrin) 400-800mg  (comes in 200mg  pills OTC, so 2-4 pills) every 8 hours. - Avoid in excess if cardiac blood pressure issues  Sore Throat:  See Aches and Pains meds above, also Sore throat sprays and lozenges may also help.   Cough:  Honey 2 TBS every 4-6 hours if needed.  Robitussin DM syrup or generic equivalent which has (guaifenesin = an expectorant to help you get stuff up + dextromethorphan (DM) = cough supressant). You can also get this in tablet formula (like Mucinex DM or generic equivalent).  If you have asthma or are wheezing and have a tight chest, then albuterol inhaler (Ventolin, ProAir) may be helpful - you need a prescription for this.   Congestion:  oxymetazoline (Afrin) nasal stray: 2 sprays each nostril every 12 hours. Don't use more than 3 days in a row to avoid building a tolerance to it.  Sinus rinse (neti pot) high volume sinus rinse can help open up your sinuses and be helpful, especially if you're having sinus pressure and headaches.   Other:  Umcka (pelargonium sidoides extract) can to shorten cold symptoms (can be hard to find, but [employer] carries it: brand name Umcka ColdCare from [employer]). Works best if you start taking at earliest signs of cold symptoms.  Andrographis paniculata is another herbal remedy with less evidence, but may reduce common cold symptoms in adults.  zinc acetate lozenges >= 80 mg/day reduces duration but not severity of cold symptoms in adults, but it is associated with bad taste and nausea Heated humidified air may reduce cold symptoms, so try using a humidifier -  especially in your bedroom at night.  Stay hydrated! Aim to drink at least 2 liters of water daily.   What doesn't work (but lots of folks think might) Vitamin C: bummer right?! But there's no evidence that high dose vitamin C will help cold symptoms.

## 2024-05-23 ENCOUNTER — Ambulatory Visit
Admission: RE | Admit: 2024-05-23 | Discharge: 2024-05-23 | Disposition: A | Discharge status: Home / Self Care | Source: Ambulatory Visit | Attending: Orthopedic Surgery | Admitting: Orthopedic Surgery

## 2024-05-23 DIAGNOSIS — M4726 Other spondylosis with radiculopathy, lumbar region: Secondary | ICD-10-CM | POA: Diagnosis not present

## 2024-05-23 DIAGNOSIS — M5416 Radiculopathy, lumbar region: Secondary | ICD-10-CM | POA: Insufficient documentation

## 2024-05-23 MED ORDER — ACETAMINOPHEN 325 MG PO TABS
650.0000 mg | ORAL_TABLET | Freq: Four times a day (QID) | ORAL | Status: DC | PRN
  Filled 2024-05-23: qty 2

## 2024-05-23 MED ORDER — IOHEXOL 180 MG/ML  SOLN
10.0000 mL | Freq: Once | INTRAMUSCULAR | Status: AC | PRN
  Administered 2024-05-23: 10 mL

## 2024-05-23 MED ORDER — IOHEXOL 180 MG/ML  SOLN
20.0000 mL | Freq: Once | INTRAMUSCULAR | Status: AC | PRN
  Administered 2024-05-23: 20 mL

## 2024-05-23 MED ORDER — LIDOCAINE 1 % OPTIME INJ - NO CHARGE
5.0000 mL | Freq: Once | INTRAMUSCULAR | Status: AC
  Administered 2024-05-23: 5 mL
  Filled 2024-05-23: qty 6

## 2024-05-23 NOTE — Discharge Instructions (Signed)
 Myelogram, Care After Refer to this sheet in the next few weeks. These instructions provide you with information on caring for yourself after your procedure. Your health care provider may also give you more specific instructions. Your treatment has been planned according to current medical practices, but problems sometimes occur. Call your health care provider if you have any problems or questions after your procedure. What can I expect after the procedure? After your procedure, it is typical to have the following sensations: Mild discomfort or pain at the insertion site. Mild headache that is relieved with pain medicines.  Follow these instructions at home:  Avoid lifting anything heavier than 10 lb (4.5 kg) for at least 12 hours after the procedure. Drink enough fluids to keep your urine clear or pale yellow. Remain at 30 degree head elevation for the remainder of the day.  Contact a health care provider if: You have fever or chills. You have nausea or vomiting. You have a headache that lasts for more than 2 days. Get help right away if: You have any numbness or tingling in your legs. You are unable to control your bowel or bladder. You have bleeding or swelling in your back at the insertion site. You are dizzy or faint. This information is not intended to replace advice given to you by your health care provider. Make sure you discuss any questions you have with your health care provider. Document Released: 06/28/2013 Document Revised: 11/29/2015 Document Reviewed: 03/01/2013 Elsevier Interactive Patient Education  2017 [employer].

## 2024-05-25 ENCOUNTER — Encounter: Payer: Self-pay | Admitting: Nurse Practitioner

## 2024-05-26 ENCOUNTER — Encounter: Payer: Self-pay | Admitting: Nurse Practitioner

## 2024-05-26 ENCOUNTER — Ambulatory Visit (INDEPENDENT_AMBULATORY_CARE_PROVIDER_SITE_OTHER): Admitting: Nurse Practitioner

## 2024-05-26 VITALS — BP 108/70 | HR 94 | Resp 15 | Ht 70.0 in | Wt 194.5 lb

## 2024-05-26 DIAGNOSIS — L03115 Cellulitis of right lower limb: Secondary | ICD-10-CM | POA: Insufficient documentation

## 2024-05-26 MED ORDER — CEPHALEXIN 500 MG PO CAPS: 500.0000 mg | ORAL_CAPSULE | Freq: Three times a day (TID) | ORAL | 0 refills | Status: AC

## 2024-05-26 MED ORDER — MUPIROCIN 2 % EX OINT: 1.0000 | TOPICAL_OINTMENT | Freq: Two times a day (BID) | CUTANEOUS | 2 refills | Status: AC

## 2024-05-26 NOTE — Patient Instructions (Signed)

## 2024-05-26 NOTE — Progress Notes (Signed)
 BP 108/70 (BP Location: Left Arm, Patient Position: Sitting, Cuff Size: Normal)   Pulse 94   Resp 15   Ht 5' 10 (1.778 m)   Wt 194 lb 8 oz (88.2 kg)   SpO2 96%   BMI 27.91 kg/m    Subjective:    Patient ID: Andrew Cobb, male    DOB: 04-30-1951, 73 y.o.   MRN: [id number]  HPI: Andrew Cobb is a 73 y.o. male  Chief Complaint  Patient presents with   Skin tears    Ongoing for 4 weeks. Still draining, Using antibiotics and band aid. Happened with a fall.    SKIN INFECTION Ongoing for 4 weeks to right lower leg.  Hit is against area causing skin tear. Is diabetic, last A1c on 02/17/24 was 7.2%. Was treated for PNA on 05/20/24 with Zpack. Duration: weeks Location: right lower leg History of trauma in area: yes Pain: no Quality: none Severity: 0/10 Redness: yes Swelling: yes Oozing: yes Pus: yes Fevers: no Nausea/vomiting: no Status: fluctuating Treatments attempted:abx ointment and hydrogen peroxide  Tetanus: UTD   Relevant past medical, surgical, family and social history reviewed and updated as indicated. Interim medical history since our last visit reviewed. Allergies and medications reviewed and updated.  Review of Systems  Constitutional:  Negative for activity change, diaphoresis, fatigue and fever.  Respiratory:  Negative for cough, chest tightness, shortness of breath and wheezing.   Cardiovascular:  Negative for chest pain, palpitations and leg swelling.  Gastrointestinal: Negative.   Skin:  Positive for wound.  Neurological: Negative.   Psychiatric/Behavioral: Negative.      Per HPI unless specifically indicated above     Objective:    BP 108/70 (BP Location: Left Arm, Patient Position: Sitting, Cuff Size: Normal)   Pulse 94   Resp 15   Ht 5' 10 (1.778 m)   Wt 194 lb 8 oz (88.2 kg)   SpO2 96%   BMI 27.91 kg/m   Wt Readings from Last 3 Encounters:  05/26/24 194 lb 8 oz (88.2 kg)  05/23/24 195 lb (88.5 kg)  05/20/24 201 lb 9.6 oz (91.4  kg)    Physical Exam Vitals and nursing note reviewed.  Constitutional:      General: He is awake. He is not in acute distress.    Appearance: He is well-developed and well-groomed. He is not ill-appearing.  HENT:     Head: Normocephalic and atraumatic.     Right Ear: Hearing normal. No drainage.     Left Ear: Hearing normal. No drainage.  Eyes:     General: Lids are normal.        Right eye: No discharge.        Left eye: No discharge.     Conjunctiva/sclera: Conjunctivae normal.     Pupils: Pupils are equal, round, and reactive to light.  Neck:     Thyroid : No thyromegaly.     Vascular: No carotid bruit or JVD.     Trachea: Trachea normal.  Cardiovascular:     Rate and Rhythm: Normal rate and regular rhythm.     Heart sounds: Normal heart sounds, S1 normal and S2 normal. No murmur heard.    No gallop.  Pulmonary:     Effort: Pulmonary effort is normal. No accessory muscle usage or respiratory distress.     Breath sounds: Normal breath sounds.  Abdominal:     General: Bowel sounds are normal.     Palpations: Abdomen is soft. There is  no hepatomegaly or splenomegaly.  Musculoskeletal:        General: Normal range of motion.     Cervical back: Normal range of motion and neck supple.     Right lower leg: No edema.     Left lower leg: No edema.  Skin:    General: Skin is warm and dry.     Capillary Refill: Capillary refill takes less than 2 seconds.     Findings: Bruising and wound present.         Comments: Bilateral bruises scattered to upper extremities.  Neurological:     Mental Status: He is alert and oriented to person, place, and time.     Deep Tendon Reflexes: Reflexes are normal and symmetric.  Psychiatric:        Attention and Perception: Attention normal.        Mood and Affect: Mood normal.        Speech: Speech normal.        Behavior: Behavior normal. Behavior is cooperative.        Thought Content: Thought content normal.     Wound right lower  leg   Results for orders placed or performed in visit on 03/02/24  Hepatic function panel   Collection Time: 03/02/24  9:03 AM  Result Value Ref Range   Total Protein 6.7 6.0 - 8.5 g/dL   Albumin 4.6 3.8 - 4.8 g/dL   Bilirubin Total 0.8 0.0 - 1.2 mg/dL   Bilirubin, Direct 9.64 0.00 - 0.40 mg/dL   Alkaline Phosphatase 80 44 - 121 IU/L   AST 42 (H) 0 - 40 IU/L   ALT 77 (H) 0 - 44 IU/L   *Note: Due to a large number of results and/or encounters for the requested time period, some results have not been displayed. A complete set of results can be found in Results Review.      Assessment & Plan:   Problem List Items Addressed This Visit       Other   Cellulitis of right lower leg - Primary   Acute, presented 4 weeks ago per patient and is not fully healing. To right lower leg, initially skin tear. Diabetic. Will start Keflex  500 MG TID and Mupirocin  applied to wound. Recommend to monitor redness and if extends further alert PCP immediately OR if a fever presents. Culture of wound sent and will adjust abx as needed. Strict ER precautions provided. Return in one week, if not improvement will get into wound care.      Relevant Orders   Wound culture     Follow up plan: Return in about 1 week (around 06/02/2024) for Wound Check -- try to get in before Thanksgiving, can be SD spot.

## 2024-05-26 NOTE — Assessment & Plan Note (Signed)
 Acute, presented 4 weeks ago per patient and is not fully healing. To right lower leg, initially skin tear. Diabetic. Will start Keflex  500 MG TID and Mupirocin  applied to wound. Recommend to monitor redness and if extends further alert PCP immediately OR if a fever presents. Culture of wound sent and will adjust abx as needed. Strict ER precautions provided. Return in one week, if not improvement will get into wound care.

## 2024-05-27 DIAGNOSIS — M5416 Radiculopathy, lumbar region: Secondary | ICD-10-CM | POA: Diagnosis not present

## 2024-05-28 ENCOUNTER — Ambulatory Visit: Payer: Self-pay | Admitting: Nurse Practitioner

## 2024-05-28 MED ORDER — FLUCONAZOLE 150 MG PO TABS: 150.0000 mg | ORAL_TABLET | Freq: Every day | ORAL | 0 refills | Status: DC

## 2024-05-28 NOTE — Patient Instructions (Incomplete)

## 2024-05-28 NOTE — Progress Notes (Signed)
 Good evening Andrew Cobb, your wound culture is returning. Still awaiting one more result. However, there is a moderate amount of yeast in wound. Continue antibiotic for now and I am sending in a one time dose of Fluconazole  for you to take, please hold your Atorvastatin  and Amitriptyline  for 24 hours after you take the Fluconazole  dose to avoid interactions. Any questions? Keep being amazing!!  Thank you for allowing me to participate in your care.  I appreciate you. Kindest regards, Emireth Cockerham

## 2024-05-30 ENCOUNTER — Ambulatory Visit: Admitting: Nurse Practitioner

## 2024-05-30 ENCOUNTER — Encounter: Payer: Self-pay | Admitting: Nurse Practitioner

## 2024-05-30 VITALS — BP 132/76 | HR 54 | Temp 97.9°F | Resp 17 | Ht 70.0 in | Wt 195.0 lb

## 2024-05-30 DIAGNOSIS — L03115 Cellulitis of right lower limb: Secondary | ICD-10-CM | POA: Diagnosis not present

## 2024-05-30 NOTE — Progress Notes (Signed)
 BP 132/76 (BP Location: Left Arm, Patient Position: Sitting, Cuff Size: Normal)   Pulse (!) 54   Temp 97.9 F (36.6 C) (Oral)   Resp 17   Ht 5' 10 (1.778 m)   Wt 195 lb (88.5 kg)   SpO2 98%   BMI 27.98 kg/m    Subjective:    Patient ID: Andrew Cobb, male    DOB: [birth year], 73 y.o.   MRN: [id number]  HPI: Andrew Cobb is a 73 y.o. male  Chief Complaint  Patient presents with   Cellulitis of right lower leg    Feels it is improving. Starting to scab over.    SKIN INFECTION Follow-up today for wound check.  Started on Keflex  and Mupirocin . Culture complete still pending, did note yeast and was sent in Fluconazole . Reports he feels it is healing.  Started about 4 weeks to right lower leg.  Hit it against area causing skin tear. Is diabetic, last A1c on 02/17/24 was 7.2%. Was treated for PNA on 05/20/24 with Zpack. Duration: weeks Location: right lower leg History of trauma in area: yes Pain: no Quality: none Severity: 0/10 Redness: yes Swelling: yes Oozing: yes Pus: yes Fevers: no Nausea/vomiting: no Status: fluctuating Treatments attempted:abx ointment and hydrogen peroxide  Tetanus: UTD   Relevant past medical, surgical, family and social history reviewed and updated as indicated. Interim medical history since our last visit reviewed. Allergies and medications reviewed and updated.  Review of Systems  Constitutional:  Negative for activity change, diaphoresis, fatigue and fever.  Respiratory:  Negative for cough, chest tightness, shortness of breath and wheezing.   Cardiovascular:  Negative for chest pain, palpitations and leg swelling.  Gastrointestinal: Negative.   Skin:  Positive for wound.  Neurological: Negative.   Psychiatric/Behavioral: Negative.      Per HPI unless specifically indicated above     Objective:    BP 132/76 (BP Location: Left Arm, Patient Position: Sitting, Cuff Size: Normal)   Pulse (!) 54   Temp 97.9 F (36.6 C) (Oral)    Resp 17   Ht 5' 10 (1.778 m)   Wt 195 lb (88.5 kg)   SpO2 98%   BMI 27.98 kg/m   Wt Readings from Last 3 Encounters:  05/30/24 195 lb (88.5 kg)  05/26/24 194 lb 8 oz (88.2 kg)  05/23/24 195 lb (88.5 kg)    Physical Exam Vitals and nursing note reviewed.  Constitutional:      General: He is awake. He is not in acute distress.    Appearance: He is well-developed and well-groomed. He is not ill-appearing.  HENT:     Head: Normocephalic and atraumatic.     Right Ear: Hearing normal. No drainage.     Left Ear: Hearing normal. No drainage.  Eyes:     General: Lids are normal.        Right eye: No discharge.        Left eye: No discharge.     Conjunctiva/sclera: Conjunctivae normal.     Pupils: Pupils are equal, round, and reactive to light.  Neck:     Thyroid : No thyromegaly.     Vascular: No carotid bruit or JVD.     Trachea: Trachea normal.  Cardiovascular:     Rate and Rhythm: Normal rate and regular rhythm.     Heart sounds: Normal heart sounds, S1 normal and S2 normal. No murmur heard.    No gallop.  Pulmonary:     Effort: Pulmonary effort is  normal. No accessory muscle usage or respiratory distress.     Breath sounds: Normal breath sounds.  Abdominal:     General: Bowel sounds are normal.     Palpations: Abdomen is soft. There is no hepatomegaly or splenomegaly.  Musculoskeletal:        General: Normal range of motion.     Cervical back: Normal range of motion and neck supple.     Right lower leg: No edema.     Left lower leg: No edema.  Skin:    General: Skin is warm and dry.     Capillary Refill: Capillary refill takes less than 2 seconds.     Findings: Bruising and wound present.         Comments: Bilateral bruises scattered to upper extremities.  Neurological:     Mental Status: He is alert and oriented to person, place, and time.     Deep Tendon Reflexes: Reflexes are normal and symmetric.  Psychiatric:        Attention and Perception: Attention normal.         Mood and Affect: Mood normal.        Speech: Speech normal.        Behavior: Behavior normal. Behavior is cooperative.        Thought Content: Thought content normal.     Results for orders placed or performed in visit on 05/26/24  Wound culture   Collection Time: 05/26/24  1:17 PM   Specimen: Wound   WO  Result Value Ref Range   Gram Stain Result Final report    Organism ID, Bacteria Comment    Organism ID, Bacteria Comment    Organism ID, Bacteria Comment    Aerobic Bacterial Culture WILL FOLLOW    *Note: Due to a large number of results and/or encounters for the requested time period, some results have not been displayed. A complete set of results can be found in Results Review.      Assessment & Plan:   Problem List Items Addressed This Visit       Other   Cellulitis of right lower leg - Primary   Acute, presented 4 weeks ago per patient and is not fully healing. To right lower leg, initially skin tear. Diabetic. Sugars have been a bit higher since infection he reports. Will continue Keflex  500 MG TID and Mupirocin  applied to wound + recommend he start placing Manuka Honey to wound BID. Recommend to monitor redness and if extends further alert PCP immediately OR if a fever presents. Waiting on full culture results, did note yeast and was treated for this. Strict ER precautions provided. Urgent referral to wound care due to slow improvement and diabetes.      Relevant Orders   Ambulatory referral to Wound Clinic      Follow up plan: Return in about 1 week (around 06/06/2024) for Wound check.

## 2024-05-30 NOTE — Assessment & Plan Note (Signed)
 Acute, presented 4 weeks ago per patient and is not fully healing. To right lower leg, initially skin tear. Diabetic. Sugars have been a bit higher since infection he reports. Will continue Keflex  500 MG TID and Mupirocin  applied to wound + recommend he start placing Manuka Honey to wound BID. Recommend to monitor redness and if extends further alert PCP immediately OR if a fever presents. Waiting on full culture results, did note yeast and was treated for this. Strict ER precautions provided. Urgent referral to wound care due to slow improvement and diabetes.

## 2024-06-01 LAB — WOUND CULTURE

## 2024-06-01 NOTE — Progress Notes (Signed)
 Contacted via MyChart  As discussed the wound is showing yeast on final report. Ensure to take the Diflucan  that was ordered. Any questions?

## 2024-06-03 NOTE — Patient Instructions (Signed)

## 2024-06-06 ENCOUNTER — Encounter: Payer: Self-pay | Admitting: Nurse Practitioner

## 2024-06-06 ENCOUNTER — Ambulatory Visit: Admitting: Nurse Practitioner

## 2024-06-06 VITALS — BP 126/70 | HR 58 | Temp 98.4°F | Resp 16 | Ht 70.0 in | Wt 201.0 lb

## 2024-06-06 DIAGNOSIS — S81801A Unspecified open wound, right lower leg, initial encounter: Secondary | ICD-10-CM

## 2024-06-06 DIAGNOSIS — L03115 Cellulitis of right lower limb: Secondary | ICD-10-CM | POA: Diagnosis not present

## 2024-06-06 MED ORDER — AMOXICILLIN-POT CLAVULANATE 875-125 MG PO TABS: 1.0000 | ORAL_TABLET | Freq: Two times a day (BID) | ORAL | 0 refills | Status: DC

## 2024-06-06 MED ORDER — FLUCONAZOLE 150 MG PO TABS: 150.0000 mg | ORAL_TABLET | Freq: Every day | ORAL | 0 refills | Status: AC

## 2024-06-06 MED ORDER — CEPHALEXIN 500 MG PO CAPS: 500.0000 mg | ORAL_CAPSULE | Freq: Three times a day (TID) | ORAL | 0 refills | Status: AC

## 2024-06-06 NOTE — Progress Notes (Signed)
 BP 126/70 (BP Location: Left Arm)   Pulse (!) 58   Temp 98.4 F (36.9 C) (Oral)   Resp 16   Ht 5' 10 (1.778 m)   Wt 201 lb (91.2 kg)   SpO2 98%   BMI 28.84 kg/m    Subjective:    Patient ID: Andrew Cobb, male    DOB: 01-26-1951, 73 y.o.   MRN: [id number]  HPI: Andrew Cobb is a 74 y.o. male  Chief Complaint  Patient presents with   Wound Check   SKIN INFECTION Started on Keflex  and Mupirocin  on 05/26/24, this was extended on 05/30/24. Culture complete noted candida parapsilosis and Fluconazole  dose given. Started about >4 weeks to right lower leg.  Hit it against area causing skin tear. Is diabetic, last A1c on 02/17/24 was 7.2%. Was treated for PNA on 05/20/24 with Zpack. He feels wound is about the same. Sees would clinic for initial visit on 06/22/24. Duration: weeks Location: right lower leg History of trauma in area: yes Pain: no Quality: none Severity: 0/10 Redness: yes Swelling: yes Oozing: yes Pus: yes Fevers: no Nausea/vomiting: no Status: fluctuating Treatments attempted:abx ointment and hydrogen peroxide  Tetanus: UTD   Relevant past medical, surgical, family and social history reviewed and updated as indicated. Interim medical history since our last visit reviewed. Allergies and medications reviewed and updated.  Review of Systems  Constitutional:  Negative for activity change, diaphoresis, fatigue and fever.  Respiratory:  Negative for cough, chest tightness, shortness of breath and wheezing.   Cardiovascular:  Negative for chest pain, palpitations and leg swelling.  Gastrointestinal: Negative.   Skin:  Positive for wound.  Neurological: Negative.   Psychiatric/Behavioral: Negative.      Per HPI unless specifically indicated above     Objective:    BP 126/70 (BP Location: Left Arm)   Pulse (!) 58   Temp 98.4 F (36.9 C) (Oral)   Resp 16   Ht 5' 10 (1.778 m)   Wt 201 lb (91.2 kg)   SpO2 98%   BMI 28.84 kg/m   Wt Readings from  Last 3 Encounters:  06/06/24 201 lb (91.2 kg)  05/30/24 195 lb (88.5 kg)  05/26/24 194 lb 8 oz (88.2 kg)    Physical Exam Vitals and nursing note reviewed.  Constitutional:      General: He is awake. He is not in acute distress.    Appearance: He is well-developed and well-groomed. He is not ill-appearing.  HENT:     Head: Normocephalic and atraumatic.     Right Ear: Hearing normal. No drainage.     Left Ear: Hearing normal. No drainage.  Eyes:     General: Lids are normal.        Right eye: No discharge.        Left eye: No discharge.     Conjunctiva/sclera: Conjunctivae normal.     Pupils: Pupils are equal, round, and reactive to light.  Neck:     Thyroid : No thyromegaly.     Vascular: No carotid bruit or JVD.     Trachea: Trachea normal.  Cardiovascular:     Rate and Rhythm: Regular rhythm. Bradycardia present.     Heart sounds: Normal heart sounds, S1 normal and S2 normal. No murmur heard.    No gallop.  Pulmonary:     Effort: Pulmonary effort is normal. No accessory muscle usage or respiratory distress.     Breath sounds: Normal breath sounds.  Abdominal:  General: Bowel sounds are normal.     Palpations: Abdomen is soft. There is no hepatomegaly or splenomegaly.  Musculoskeletal:        General: Normal range of motion.     Cervical back: Normal range of motion and neck supple.     Right lower leg: No edema.     Left lower leg: No edema.  Skin:    General: Skin is warm and dry.     Capillary Refill: Capillary refill takes less than 2 seconds.     Findings: Bruising and wound present.         Comments: Bilateral bruises scattered to upper extremities.  Neurological:     Mental Status: He is alert and oriented to person, place, and time.     Deep Tendon Reflexes: Reflexes are normal and symmetric.  Psychiatric:        Attention and Perception: Attention normal.        Mood and Affect: Mood normal.        Speech: Speech normal.        Behavior: Behavior  normal. Behavior is cooperative.        Thought Content: Thought content normal.     Results for orders placed or performed in visit on 05/26/24  Wound culture   Collection Time: 05/26/24  1:17 PM   Specimen: Wound   WO  Result Value Ref Range   Gram Stain Result Final report    Organism ID, Bacteria Comment    Organism ID, Bacteria Comment    Organism ID, Bacteria Comment    Aerobic Bacterial Culture Final report (A)    Organism ID, Bacteria Candida parapsilosis (A)    Organism ID, Bacteria Mixed skin flora    Organism ID, Bacteria Not applicable    *Note: Due to a large number of results and/or encounters for the requested time period, some results have not been displayed. A complete set of results can be found in Results Review.      Assessment & Plan:   Problem List Items Addressed This Visit       Other   Open wound of right lower leg   Refer to cellulitis plan of care.      Relevant Orders   Ambulatory referral to Home Health   Cellulitis of right lower leg - Primary   Acute, presented >4 weeks ago per patient and is not fully healing. To right lower leg, initially skin tear. Diabetic. Will extend Keflex  for 5 more days as seeing some improvement and continue Manuka Honey to wound BID. Send in another dose of Fluconazole  due to culture with candida and aware to hold statin for 24 hours after taking this. Recommend to monitor redness and if extends further alert PCP immediately OR if a fever presents. Strict ER precautions provided. Scheduled to see wound clinic on 17th, appreciate their input. Referral for home health to come in ASAP to assist with wound care and recommendations. Discussed plan of care with patient.      Relevant Orders   Ambulatory referral to Home Health       Follow up plan: Return in about 1 week (around 06/13/2024) for Wound check.

## 2024-06-06 NOTE — Assessment & Plan Note (Addendum)
 Acute, presented >4 weeks ago per patient and is not fully healing. To right lower leg, initially skin tear. Diabetic. Will extend Keflex  for 5 more days as seeing some improvement and continue Manuka Honey to wound BID. Send in another dose of Fluconazole  due to culture with candida and aware to hold statin for 24 hours after taking this. Recommend to monitor redness and if extends further alert PCP immediately OR if a fever presents. Strict ER precautions provided. Scheduled to see wound clinic on 17th, appreciate their input. Referral for home health to come in ASAP to assist with wound care and recommendations. Discussed plan of care with patient.

## 2024-06-06 NOTE — Assessment & Plan Note (Signed)
Refer to cellulitis plan of care. 

## 2024-06-14 ENCOUNTER — Ambulatory Visit (INDEPENDENT_AMBULATORY_CARE_PROVIDER_SITE_OTHER): Admitting: Nurse Practitioner

## 2024-06-14 ENCOUNTER — Encounter: Payer: Self-pay | Admitting: Nurse Practitioner

## 2024-06-14 VITALS — BP 138/68 | HR 62 | Temp 98.9°F | Resp 15 | Ht 70.0 in | Wt 201.0 lb

## 2024-06-14 DIAGNOSIS — Z7984 Long term (current) use of oral hypoglycemic drugs: Secondary | ICD-10-CM | POA: Diagnosis not present

## 2024-06-14 DIAGNOSIS — S81801A Unspecified open wound, right lower leg, initial encounter: Secondary | ICD-10-CM

## 2024-06-14 DIAGNOSIS — L03115 Cellulitis of right lower limb: Secondary | ICD-10-CM | POA: Diagnosis not present

## 2024-06-14 NOTE — Patient Instructions (Signed)

## 2024-06-14 NOTE — Assessment & Plan Note (Signed)
Refer to cellulitis plan of care. 

## 2024-06-14 NOTE — Assessment & Plan Note (Signed)
 Acute, presented 6 weeks ago after skin tear. Some improvement noted to today and has decreased in size. To right lower leg. Diabetic. Continue Manuka Honey to wound BID. Has had lengthy treatment with Keflex  and Fluconazole . Recommend to monitor redness and if extends further alert PCP immediately OR if a fever presents. Strict ER precautions provided. Scheduled to see wound clinic on 17th, appreciate their input, and wound care nurse scheduled to come out tomorrow, which will be beneficial.

## 2024-06-14 NOTE — Progress Notes (Signed)
 BP 138/68 (BP Location: Left Arm, Patient Position: Sitting, Cuff Size: Normal)   Pulse 62   Temp 98.9 F (37.2 C) (Oral)   Resp 15   Ht 5' 10 (1.778 m)   Wt 201 lb (91.2 kg)   SpO2 98%   BMI 28.84 kg/m    Subjective:    Patient ID: Andrew Cobb, male    DOB: October 20, 1950, 73 y.o.   MRN: [id number]  HPI: Andrew Cobb is a 73 y.o. male  Chief Complaint  Patient presents with   Wound Check    Still swollen, no pain however. Has a home nurse coming to check as well tomorrow.    SKIN INFECTION Follow-up for right lower leg wound check. Started on Keflex  and Mupirocin  on 05/26/24, this treatment was extended previous visits. Culture complete noted candida parapsilosis and Fluconazole  dose given x 2 total. Started about 6 weeks to right lower leg.  Hit it against area causing skin tear initially which did not heal. Is diabetic, last A1c on 02/17/24 was 7.2%, scheduled for recheck 07/06/24. Sees would clinic for initial visit on 06/22/24. He feels it is getting better, still a little drainage but less. Home health nurse comes tomorrow for initial wound care visit. Duration: weeks Location: right lower leg History of trauma in area: yes Pain: no Quality: none Severity: 0/10 Redness: yes Swelling: yes Oozing: yes, less drainage Pus: improving Fevers: no Nausea/vomiting: no Status: fluctuating Treatments attempted:abx ointment and hydrogen peroxide  Tetanus: UTD   Relevant past medical, surgical, family and social history reviewed and updated as indicated. Interim medical history since our last visit reviewed. Allergies and medications reviewed and updated.  Review of Systems  Constitutional:  Negative for activity change, diaphoresis, fatigue and fever.  Respiratory:  Negative for cough, chest tightness, shortness of breath and wheezing.   Cardiovascular:  Negative for chest pain, palpitations and leg swelling.  Gastrointestinal: Negative.   Skin:  Positive for wound.   Neurological: Negative.   Psychiatric/Behavioral: Negative.      Per HPI unless specifically indicated above     Objective:    BP 138/68 (BP Location: Left Arm, Patient Position: Sitting, Cuff Size: Normal)   Pulse 62   Temp 98.9 F (37.2 C) (Oral)   Resp 15   Ht 5' 10 (1.778 m)   Wt 201 lb (91.2 kg)   SpO2 98%   BMI 28.84 kg/m   Wt Readings from Last 3 Encounters:  06/14/24 201 lb (91.2 kg)  06/06/24 201 lb (91.2 kg)  05/30/24 195 lb (88.5 kg)    Physical Exam Vitals and nursing note reviewed.  Constitutional:      General: He is awake. He is not in acute distress.    Appearance: He is well-developed and well-groomed. He is not ill-appearing.  HENT:     Head: Normocephalic and atraumatic.     Right Ear: Hearing normal. No drainage.     Left Ear: Hearing normal. No drainage.  Eyes:     General: Lids are normal.        Right eye: No discharge.        Left eye: No discharge.     Conjunctiva/sclera: Conjunctivae normal.     Pupils: Pupils are equal, round, and reactive to light.  Neck:     Thyroid : No thyromegaly.     Vascular: No carotid bruit or JVD.     Trachea: Trachea normal.  Cardiovascular:     Rate and Rhythm: Normal rate  and regular rhythm.     Heart sounds: Normal heart sounds, S1 normal and S2 normal. No murmur heard.    No gallop.  Pulmonary:     Effort: Pulmonary effort is normal. No accessory muscle usage or respiratory distress.     Breath sounds: Normal breath sounds.  Abdominal:     General: Bowel sounds are normal.     Palpations: Abdomen is soft. There is no hepatomegaly or splenomegaly.  Musculoskeletal:        General: Normal range of motion.     Cervical back: Normal range of motion and neck supple.     Right lower leg: No edema.     Left lower leg: No edema.  Skin:    General: Skin is warm and dry.     Capillary Refill: Capillary refill takes less than 2 seconds.     Findings: Bruising and wound present.         Comments: Bilateral  bruises scattered to upper extremities.  Neurological:     Mental Status: He is alert and oriented to person, place, and time.     Deep Tendon Reflexes: Reflexes are normal and symmetric.  Psychiatric:        Attention and Perception: Attention normal.        Mood and Affect: Mood normal.        Speech: Speech normal.        Behavior: Behavior normal. Behavior is cooperative.        Thought Content: Thought content normal.     Results for orders placed or performed in visit on 05/26/24  Wound culture   Collection Time: 05/26/24  1:17 PM   Specimen: Wound   WO  Result Value Ref Range   Gram Stain Result Final report    Organism ID, Bacteria Comment    Organism ID, Bacteria Comment    Organism ID, Bacteria Comment    Aerobic Bacterial Culture Final report (A)    Organism ID, Bacteria Candida parapsilosis (A)    Organism ID, Bacteria Mixed skin flora    Organism ID, Bacteria Not applicable    *Note: Due to a large number of results and/or encounters for the requested time period, some results have not been displayed. A complete set of results can be found in Results Review.      Assessment & Plan:   Problem List Items Addressed This Visit       Other   Open wound of right lower leg - Primary   Refer to cellulitis plan of care.      Cellulitis of right lower leg   Acute, presented 6 weeks ago after skin tear. Some improvement noted to today and has decreased in size. To right lower leg. Diabetic. Continue Manuka Honey to wound BID. Has had lengthy treatment with Keflex  and Fluconazole . Recommend to monitor redness and if extends further alert PCP immediately OR if a fever presents. Strict ER precautions provided. Scheduled to see wound clinic on 17th, appreciate their input, and wound care nurse scheduled to come out tomorrow, which will be beneficial.       Follow up plan: Return for as scheduled on 07/06/24.

## 2024-06-15 DIAGNOSIS — M5416 Radiculopathy, lumbar region: Secondary | ICD-10-CM | POA: Diagnosis not present

## 2024-06-22 ENCOUNTER — Other Ambulatory Visit: Payer: Self-pay

## 2024-06-22 ENCOUNTER — Encounter: Attending: Internal Medicine | Admitting: Internal Medicine

## 2024-06-22 DIAGNOSIS — E11622 Type 2 diabetes mellitus with other skin ulcer: Secondary | ICD-10-CM | POA: Diagnosis not present

## 2024-06-22 DIAGNOSIS — X58XXXA Exposure to other specified factors, initial encounter: Secondary | ICD-10-CM | POA: Diagnosis not present

## 2024-06-22 DIAGNOSIS — L97818 Non-pressure chronic ulcer of other part of right lower leg with other specified severity: Secondary | ICD-10-CM | POA: Insufficient documentation

## 2024-06-22 DIAGNOSIS — S80811A Abrasion, right lower leg, initial encounter: Secondary | ICD-10-CM | POA: Diagnosis present

## 2024-06-22 DIAGNOSIS — Z0289 Encounter for other administrative examinations: Secondary | ICD-10-CM

## 2024-06-22 DIAGNOSIS — I87311 Chronic venous hypertension (idiopathic) with ulcer of right lower extremity: Secondary | ICD-10-CM | POA: Diagnosis not present

## 2024-06-22 DIAGNOSIS — T887XXA Unspecified adverse effect of drug or medicament, initial encounter: Secondary | ICD-10-CM

## 2024-06-22 DIAGNOSIS — I69398 Other sequelae of cerebral infarction: Secondary | ICD-10-CM

## 2024-06-24 LAB — COMPREHENSIVE METABOLIC PANEL WITH GFR
ALT: 54 IU/L — ABNORMAL HIGH (ref 0–44)
AST: 38 IU/L (ref 0–40)
Albumin: 4.5 g/dL (ref 3.8–4.8)
Alkaline Phosphatase: 87 IU/L (ref 47–123)
BUN/Creatinine Ratio: 24 (ref 10–24)
BUN: 30 mg/dL — ABNORMAL HIGH (ref 8–27)
Bilirubin Total: 0.4 mg/dL (ref 0.0–1.2)
CO2: 23 mmol/L (ref 20–29)
Calcium: 9.2 mg/dL (ref 8.6–10.2)
Chloride: 100 mmol/L (ref 96–106)
Creatinine, Ser: 1.24 mg/dL (ref 0.76–1.27)
Globulin, Total: 1.7 g/dL (ref 1.5–4.5)
Glucose: 202 mg/dL — ABNORMAL HIGH (ref 70–99)
Potassium: 4.7 mmol/L (ref 3.5–5.2)
Sodium: 141 mmol/L (ref 134–144)
Total Protein: 6.2 g/dL (ref 6.0–8.5)
eGFR: 61 mL/min/1.73 (ref >59)

## 2024-06-24 LAB — LAMOTRIGINE LEVEL: Lamotrigine Lvl: 4.1 ug/mL (ref 2.0–20.0)

## 2024-06-27 ENCOUNTER — Ambulatory Visit: Payer: Self-pay | Admitting: Adult Health

## 2024-06-27 DIAGNOSIS — S81801A Unspecified open wound, right lower leg, initial encounter: Secondary | ICD-10-CM | POA: Diagnosis not present

## 2024-06-27 DIAGNOSIS — I10 Essential (primary) hypertension: Secondary | ICD-10-CM | POA: Diagnosis not present

## 2024-06-27 DIAGNOSIS — Z792 Long term (current) use of antibiotics: Secondary | ICD-10-CM | POA: Diagnosis not present

## 2024-06-27 DIAGNOSIS — L03115 Cellulitis of right lower limb: Secondary | ICD-10-CM | POA: Diagnosis not present

## 2024-06-27 DIAGNOSIS — S81001A Unspecified open wound, right knee, initial encounter: Secondary | ICD-10-CM | POA: Diagnosis not present

## 2024-06-27 DIAGNOSIS — B372 Candidiasis of skin and nail: Secondary | ICD-10-CM | POA: Diagnosis not present

## 2024-06-27 DIAGNOSIS — Z79899 Other long term (current) drug therapy: Secondary | ICD-10-CM | POA: Diagnosis not present

## 2024-06-27 DIAGNOSIS — Z8673 Personal history of transient ischemic attack (TIA), and cerebral infarction without residual deficits: Secondary | ICD-10-CM | POA: Diagnosis not present

## 2024-06-27 DIAGNOSIS — Z8701 Personal history of pneumonia (recurrent): Secondary | ICD-10-CM | POA: Diagnosis not present

## 2024-06-27 DIAGNOSIS — Z7982 Long term (current) use of aspirin: Secondary | ICD-10-CM | POA: Diagnosis not present

## 2024-06-27 DIAGNOSIS — E119 Type 2 diabetes mellitus without complications: Secondary | ICD-10-CM | POA: Diagnosis not present

## 2024-06-27 DIAGNOSIS — Z7984 Long term (current) use of oral hypoglycemic drugs: Secondary | ICD-10-CM | POA: Diagnosis not present

## 2024-06-28 MED ORDER — LEVETIRACETAM 250 MG PO TABS: ORAL_TABLET | ORAL | 0 refills | Status: AC

## 2024-06-28 MED ORDER — LAMOTRIGINE 100 MG PO TABS: 100.0000 mg | ORAL_TABLET | Freq: Two times a day (BID) | ORAL | 3 refills | Status: AC

## 2024-07-02 NOTE — Patient Instructions (Signed)

## 2024-07-05 ENCOUNTER — Other Ambulatory Visit: Payer: Self-pay | Admitting: *Deleted

## 2024-07-05 ENCOUNTER — Encounter: Admitting: Physician Assistant

## 2024-07-05 DIAGNOSIS — S80811A Abrasion, right lower leg, initial encounter: Secondary | ICD-10-CM | POA: Diagnosis not present

## 2024-07-05 DIAGNOSIS — C61 Malignant neoplasm of prostate: Secondary | ICD-10-CM

## 2024-07-05 LAB — OPHTHALMOLOGY REPORT-SCANNED

## 2024-07-06 ENCOUNTER — Encounter: Payer: Self-pay | Admitting: Nurse Practitioner

## 2024-07-06 ENCOUNTER — Ambulatory Visit: Admitting: Nurse Practitioner

## 2024-07-06 VITALS — BP 136/72 | HR 62 | Temp 98.1°F | Resp 18 | Ht 70.0 in | Wt 205.0 lb

## 2024-07-06 DIAGNOSIS — S81801D Unspecified open wound, right lower leg, subsequent encounter: Secondary | ICD-10-CM | POA: Diagnosis not present

## 2024-07-06 DIAGNOSIS — E119 Type 2 diabetes mellitus without complications: Secondary | ICD-10-CM

## 2024-07-06 DIAGNOSIS — D696 Thrombocytopenia, unspecified: Secondary | ICD-10-CM | POA: Diagnosis not present

## 2024-07-06 DIAGNOSIS — J4520 Mild intermittent asthma, uncomplicated: Secondary | ICD-10-CM

## 2024-07-06 DIAGNOSIS — Z7984 Long term (current) use of oral hypoglycemic drugs: Secondary | ICD-10-CM | POA: Diagnosis not present

## 2024-07-06 DIAGNOSIS — G4733 Obstructive sleep apnea (adult) (pediatric): Secondary | ICD-10-CM

## 2024-07-06 DIAGNOSIS — I4819 Other persistent atrial fibrillation: Secondary | ICD-10-CM

## 2024-07-06 DIAGNOSIS — E1159 Type 2 diabetes mellitus with other circulatory complications: Secondary | ICD-10-CM | POA: Diagnosis not present

## 2024-07-06 DIAGNOSIS — R569 Unspecified convulsions: Secondary | ICD-10-CM | POA: Diagnosis not present

## 2024-07-06 DIAGNOSIS — I152 Hypertension secondary to endocrine disorders: Secondary | ICD-10-CM

## 2024-07-06 DIAGNOSIS — I872 Venous insufficiency (chronic) (peripheral): Secondary | ICD-10-CM | POA: Diagnosis not present

## 2024-07-06 DIAGNOSIS — E1169 Type 2 diabetes mellitus with other specified complication: Secondary | ICD-10-CM | POA: Diagnosis not present

## 2024-07-06 DIAGNOSIS — Z8673 Personal history of transient ischemic attack (TIA), and cerebral infarction without residual deficits: Secondary | ICD-10-CM | POA: Diagnosis not present

## 2024-07-06 DIAGNOSIS — E1165 Type 2 diabetes mellitus with hyperglycemia: Secondary | ICD-10-CM

## 2024-07-06 LAB — BAYER DCA HB A1C WAIVED: HB A1C (BAYER DCA - WAIVED): 6.7 % — ABNORMAL HIGH (ref 4.8–5.6)

## 2024-07-06 NOTE — Assessment & Plan Note (Signed)
Refer to diabetes with hyperglycemia plan of care.

## 2024-07-06 NOTE — Progress Notes (Signed)
 "  BP 136/72 (BP Location: Left Arm, Patient Position: Sitting, Cuff Size: Normal)   Pulse 62   Temp 98.1 F (36.7 C) (Oral)   Resp 18   Ht 5' 10 (1.778 m)   Wt 205 lb (93 kg)   SpO2 98%   BMI 29.41 kg/m    Subjective:    Patient ID: Andrew Cobb, male    DOB: [birth year], 73 y.o.   MRN: [id number]  HPI: Andrew Cobb is a 73 y.o. male  Chief Complaint  Patient presents with   Follow-up    No major concerns   DIABETES August 7.2% A1c. Continues Metformin  1000 MG BID and Jardiance  25 MG (obtains via assistance). Has wound to lower right leg, following wound care at present with last visit 06/22/24. He reports this is improving. Hypoglycemic episodes:no Polydipsia/polyuria: no Visual disturbance: no Chest pain: no Paresthesias: no Glucose Monitoring: yes  Accucheck frequency: Daily  Fasting glucose: 107 this morning, been between 95 and 115  Post prandial:  Evening:  Before meals: Taking Insulin ?: no  Long acting insulin :  Short acting insulin : Blood Pressure Monitoring: daily Retinal Examination: Up to Date has injections every 9 weeks, right eye Foot Exam: Up to Date Pneumovax: Up to Date Influenza: Up to Date Aspirin : no   HYPERTENSION / HYPERLIPIDEMIA Continues Telmisartan  80 MG daily and Atorvastatin  40 MG daily. Aortic atherosclerosis on imaging 07/11/20. Uses CPAP nightly, 100%. Saw cardiology last on 10/28/22. Continues Keppra  and Lamictal  for history of seizure-like activity post CVAs + Amitriptyline  for headaches post CVA.  No recent headaches or seizures. Saw neurology last on 05/05/24 and they are adjusting medications. Intermittent episodes of low platelets on past labs.  HISTORY: Heart surgery performed last on 07/18/22 for amulet left atrial appendage. 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  Satisfied with current treatment? yes Duration of hypertension: chronic BP monitoring frequency: daily BP range: 125-130/70-80 range  on average BP medication side effects: no Duration of hyperlipidemia: chronic Cholesterol medication side effects: no Cholesterol supplements: none Medication compliance: good compliance Aspirin : no Recent stressors: no Recurrent headaches: no Visual changes: no Palpitations: no Dyspnea: no Chest pain: no Lower extremity edema: no Dizzy/lightheaded: no  The ASCVD Risk score (Arnett DK, et al., 2019) failed to calculate for the following reasons:   Risk score cannot be calculated because patient has a medical history suggesting prior/existing ASCVD   * - Cholesterol units were assumed  ATRIAL FIBRILLATION Atrial fibrillation status: stable Satisfied with current treatment: yes  Medication side effects:  no Medication compliance: good compliance Etiology of atrial fibrillation:  Palpitations:  no Chest pain:  no Dyspnea on exertion:  no Orthopnea:  no Syncope:  no Edema:  no Ventricular rate control: Not indicated Anti-coagulation: not indicated   ASTHMA Asthma status: stable Satisfied with current treatment?: yes Albuterol /rescue inhaler frequency: minimal use Dyspnea frequency: no Wheezing frequency:no Cough frequency: no Nocturnal symptom frequency: no Limitation of activity: no Current upper respiratory symptoms: no Triggers: allergies Home peak flows:none Last Spirometry: not recent Failed/intolerant to following asthma meds: none Asthma meds in past: as above Aerochamber/spacer use: no Visits to ER or Urgent Care in past year: no Pneumovax: Up to Date Influenza: Up to Date   Relevant past medical, surgical, family and social history reviewed and updated as indicated. Interim medical history since our last visit reviewed. Allergies and medications reviewed and updated.  Review of Systems  Constitutional:  Negative for activity change, diaphoresis, fatigue and  fever.  Respiratory:  Negative for cough, chest tightness, shortness of breath and wheezing.    Cardiovascular:  Negative for chest pain, palpitations and leg swelling.  Gastrointestinal: Negative.   Neurological: Negative.   Psychiatric/Behavioral: Negative.      Per HPI unless specifically indicated above     Objective:    BP 136/72 (BP Location: Left Arm, Patient Position: Sitting, Cuff Size: Normal)   Pulse 62   Temp 98.1 F (36.7 C) (Oral)   Resp 18   Ht 5' 10 (1.778 m)   Wt 205 lb (93 kg)   SpO2 98%   BMI 29.41 kg/m   Wt Readings from Last 3 Encounters:  07/06/24 205 lb (93 kg)  06/14/24 201 lb (91.2 kg)  06/06/24 201 lb (91.2 kg)    Physical Exam Vitals and nursing note reviewed.  Constitutional:      General: He is awake. He is not in acute distress.    Appearance: He is well-developed and well-groomed. He is not ill-appearing.  HENT:     Head: Normocephalic and atraumatic.     Right Ear: Hearing normal. No drainage.     Left Ear: Hearing normal. No drainage.  Eyes:     General: Lids are normal.        Right eye: No discharge.        Left eye: No discharge.     Conjunctiva/sclera: Conjunctivae normal.     Pupils: Pupils are equal, round, and reactive to light.  Neck:     Thyroid : No thyromegaly.     Vascular: No carotid bruit or JVD.     Trachea: Trachea normal.  Cardiovascular:     Rate and Rhythm: Normal rate and regular rhythm.     Heart sounds: Normal heart sounds, S1 normal and S2 normal. No murmur heard.    No gallop.  Pulmonary:     Effort: Pulmonary effort is normal. No accessory muscle usage or respiratory distress.     Breath sounds: Normal breath sounds. No decreased breath sounds, wheezing or rales.  Abdominal:     General: Bowel sounds are normal.     Palpations: Abdomen is soft. There is no hepatomegaly or splenomegaly.  Musculoskeletal:        General: Normal range of motion.     Cervical back: Normal range of motion and neck supple.     Right lower leg: No edema.     Left lower leg: No edema.  Skin:    General: Skin is warm  and dry.     Capillary Refill: Capillary refill takes less than 2 seconds.     Findings: Bruising present.     Comments: Bilateral bruises scattered to upper extremities.  Neurological:     Mental Status: He is alert and oriented to person, place, and time.     Deep Tendon Reflexes: Reflexes are normal and symmetric.  Psychiatric:        Attention and Perception: Attention normal.        Mood and Affect: Mood normal.        Speech: Speech normal.        Behavior: Behavior normal. Behavior is cooperative.        Thought Content: Thought content normal.    Results for orders placed or performed in visit on 06/22/24  Lamotrigine  level   Collection Time: 06/22/24  8:03 AM  Result Value Ref Range   Lamotrigine  Lvl 4.1 2.0 - 20.0 ug/mL  CMP   Collection Time:  06/22/24  8:03 AM  Result Value Ref Range   Glucose 202 (H) 70 - 99 mg/dL   BUN 30 (H) 8 - 27 mg/dL   Creatinine, Ser 8.75 0.76 - 1.27 mg/dL   eGFR 61 >40 fO/fpw/8.26   BUN/Creatinine Ratio 24 10 - 24   Sodium 141 134 - 144 mmol/L   Potassium 4.7 3.5 - 5.2 mmol/L   Chloride 100 96 - 106 mmol/L   CO2 23 20 - 29 mmol/L   Calcium  9.2 8.6 - 10.2 mg/dL   Total Protein 6.2 6.0 - 8.5 g/dL   Albumin 4.5 3.8 - 4.8 g/dL   Globulin, Total 1.7 1.5 - 4.5 g/dL   Bilirubin Total 0.4 0.0 - 1.2 mg/dL   Alkaline Phosphatase 87 47 - 123 IU/L   AST 38 0 - 40 IU/L   ALT 54 (H) 0 - 44 IU/L   *Note: Due to a large number of results and/or encounters for the requested time period, some results have not been displayed. A complete set of results can be found in Results Review.      Assessment & Plan:   Problem List Items Addressed This Visit       Cardiovascular and Mediastinum   Persistent atrial fibrillation (HCC)   Chronic, ongoing with cardioversion on 07/04/21 + Amulet procedure 07/18/22.  Continue collaboration with cardiology, highly recommend he schedule follow-up with them for an annual visit and discussed with him importance of this.   Regular rhythm on exam today.      Relevant Orders   Comprehensive metabolic panel with GFR   Hypertension associated with diabetes (HCC)   Chronic, ongoing with BP close to neuro goal today and often at goal on home checks.  Recommend he monitor BP at home daily and document.  Will continue Telmisartan  80 MG daily only.  Continue focus on DASH diet and regular exercise at home.  LABS: CBC, CMP.  Continue collaboration with cardiology, highly recommend he schedule follow-up with them.  Recommend he wear compression hose at home to avoid syncopal episodes and orthostatic BP.        Relevant Orders   CBC with Differential/Platelet   Comprehensive metabolic panel with GFR   Chronic venous insufficiency   Chronic, ongoing. Continue to monitor for wounds and discussed with him how to perform skin checks at home and perform daily skin care.      Relevant Orders   CBC with Differential/Platelet     Respiratory   OSA (obstructive sleep apnea)   Chronic, stable with 100% use of CPAP.  Praised for this.  Neurology was looking into [facility] for him in the past.      Asthma   Chronic, ongoing.  Stable at this time.  Continue current medication regimen and collaboration with pulmonary as needed.      Relevant Orders   CBC with Differential/Platelet     Endocrine   Uncontrolled type 2 diabetes mellitus with hyperglycemia, without long-term current use of insulin  (HCC) - Primary   Chronic, ongoing with A1c 6.7% today, trend down from previous of 7.2%. Urine ALB 80 February 2025. Continue Telmisartan  for kidney protection. Neuro goal <6.5%, he prefers not to add on medication at this time, although this was recommended, and instead to continue current focus.  Continue  Metformin  1000 MG BID and Jardiance  25 MG - obtains via assistance (continue to work with PharmD).  May need to consider GLP1 or Januvia future visit if elevations -- discussed with him and  he prefers to not add any medication at this  time.  Recommend he check BS twice daily + continue focus on regular exercise and diet.   - ARB on board + statin - Eye and foot exam up to date. - Vaccinations up to date with exception of Shingrix.      Relevant Orders   CBC with Differential/Platelet   Bayer DCA Hb A1c Waived   Hyperlipidemia associated with type 2 diabetes mellitus (HCC)   Chronic, ongoing.  Continue tight lipid control, may need to increase Atorvastatin  to 80 MG if ever elevations above goal, recent LDL below goal for stroke prevention.  Continue current Atorvastatin  dosing.  Lipid check today.      Relevant Orders   CBC with Differential/Platelet   Comprehensive metabolic panel with GFR   Lipid Panel w/o Chol/HDL Ratio   Diabetes mellitus treated with oral medication (HCC)   Refer to diabetes with hyperglycemia plan of care.      Relevant Orders   CBC with Differential/Platelet     Hematopoietic and Hemostatic   Thrombocytopenia   Chronic, stable.  Noted on past labs, check CBC every 6 months and monitor closely.  Send to hematology as needed.  CBC next in July 2026.      Relevant Orders   CBC with Differential/Platelet     Other   Seizures (HCC)   Chronic, stable.  Being followed by neurology, recent note reviewed.  Continue current Keppra  and Lamictal  as prescribed by them and collaboration with neurology.  Continue Amitriptyline  for history of headaches post CVA.      Relevant Orders   CBC with Differential/Platelet   Open wound of right lower leg   Acute and improving. Will continue to collaborate with wound management, recent note reviewed.      History of stroke   Continue collaboration with neurology due to significant stroke history.  Continue current medication regimen.  Discussed stroke prevention goals BP <130/80, LDL <55, A1c <6.5%.        Follow up plan: Return in about 3 months (around 10/04/2024) for Annual Physical.      "

## 2024-07-06 NOTE — Assessment & Plan Note (Signed)
 Chronic, ongoing with BP close to neuro goal today and often at goal on home checks.  Recommend he monitor BP at home daily and document.  Will continue Telmisartan  80 MG daily only.  Continue focus on DASH diet and regular exercise at home.  LABS: CBC, CMP.  Continue collaboration with cardiology, highly recommend he schedule follow-up with them.  Recommend he wear compression hose at home to avoid syncopal episodes and orthostatic BP.

## 2024-07-06 NOTE — Assessment & Plan Note (Signed)
 Chronic, ongoing. Continue to monitor for wounds and discussed with him how to perform skin checks at home and perform daily skin care.

## 2024-07-06 NOTE — Assessment & Plan Note (Signed)
Chronic, ongoing.  Stable at this time.  Continue current medication regimen and collaboration with pulmonary as needed.

## 2024-07-06 NOTE — Assessment & Plan Note (Signed)
 Acute and improving. Will continue to collaborate with wound management, recent note reviewed.

## 2024-07-06 NOTE — Assessment & Plan Note (Signed)
 Chronic, ongoing with A1c 6.7% today, trend down from previous of 7.2%. Urine ALB 80 February 2025. Continue Telmisartan  for kidney protection. Neuro goal <6.5%, he prefers not to add on medication at this time, although this was recommended, and instead to continue current focus.  Continue  Metformin  1000 MG BID and Jardiance  25 MG - obtains via assistance (continue to work with PharmD).  May need to consider GLP1 or Januvia future visit if elevations -- discussed with him and he prefers to not add any medication at this time.  Recommend he check BS twice daily + continue focus on regular exercise and diet.   - ARB on board + statin - Eye and foot exam up to date. - Vaccinations up to date with exception of Shingrix.

## 2024-07-06 NOTE — Assessment & Plan Note (Signed)
 Chronic, stable with 100% use of CPAP.  Praised for this.  Neurology was looking into [facility] for him in the past.

## 2024-07-06 NOTE — Assessment & Plan Note (Signed)
Continue collaboration with neurology due to significant stroke history.  Continue current medication regimen.  Discussed stroke prevention goals BP <130/80, LDL <55, A1c <6.5%.

## 2024-07-06 NOTE — Assessment & Plan Note (Signed)
 Chronic, ongoing.  Continue tight lipid control, may need to increase Atorvastatin  to 80 MG if ever elevations above goal, recent LDL below goal for stroke prevention.  Continue current Atorvastatin  dosing.  Lipid check today.

## 2024-07-06 NOTE — Assessment & Plan Note (Signed)
 Chronic, stable.  Noted on past labs, check CBC every 6 months and monitor closely.  Send to hematology as needed.  CBC next in July 2026.

## 2024-07-06 NOTE — Assessment & Plan Note (Signed)
 Chronic, ongoing with cardioversion on 07/04/21 + Amulet procedure 07/18/22.  Continue collaboration with cardiology, highly recommend he schedule follow-up with them for an annual visit and discussed with him importance of this.  Regular rhythm on exam today.

## 2024-07-06 NOTE — Assessment & Plan Note (Signed)
 Chronic, stable.  Being followed by neurology, recent note reviewed.  Continue current Keppra  and Lamictal  as prescribed by them and collaboration with neurology.  Continue Amitriptyline  for history of headaches post CVA.

## 2024-07-07 ENCOUNTER — Ambulatory Visit: Payer: Self-pay | Admitting: Nurse Practitioner

## 2024-07-07 LAB — CBC WITH DIFFERENTIAL/PLATELET
Basophils Absolute: 0 x10E3/uL (ref 0.0–0.2)
Basos: 1 %
EOS (ABSOLUTE): 0.1 x10E3/uL (ref 0.0–0.4)
Eos: 2 %
Hematocrit: 42.2 % (ref 37.5–51.0)
Hemoglobin: 13.7 g/dL (ref 13.0–17.7)
Immature Grans (Abs): 0 x10E3/uL (ref 0.0–0.1)
Immature Granulocytes: 0 %
Lymphocytes Absolute: 0.7 x10E3/uL (ref 0.7–3.1)
Lymphs: 14 %
MCH: 32 pg (ref 26.6–33.0)
MCHC: 32.5 g/dL (ref 31.5–35.7)
MCV: 99 fL — ABNORMAL HIGH (ref 79–97)
Monocytes Absolute: 0.4 x10E3/uL (ref 0.1–0.9)
Monocytes: 8 %
Neutrophils Absolute: 4 x10E3/uL (ref 1.4–7.0)
Neutrophils: 75 %
Platelets: 145 x10E3/uL — ABNORMAL LOW (ref 150–450)
RBC: 4.28 x10E6/uL (ref 4.14–5.80)
RDW: 11.6 % (ref 11.6–15.4)
WBC: 5.3 x10E3/uL (ref 3.4–10.8)

## 2024-07-07 LAB — COMPREHENSIVE METABOLIC PANEL WITH GFR
ALT: 68 IU/L — ABNORMAL HIGH (ref 0–44)
AST: 51 IU/L — ABNORMAL HIGH (ref 0–40)
Albumin: 4.6 g/dL (ref 3.8–4.8)
Alkaline Phosphatase: 90 IU/L (ref 47–123)
BUN/Creatinine Ratio: 27 — ABNORMAL HIGH (ref 10–24)
BUN: 33 mg/dL — ABNORMAL HIGH (ref 8–27)
Bilirubin Total: 0.6 mg/dL (ref 0.0–1.2)
CO2: 22 mmol/L (ref 20–29)
Calcium: 9.2 mg/dL (ref 8.6–10.2)
Chloride: 101 mmol/L (ref 96–106)
Creatinine, Ser: 1.22 mg/dL (ref 0.76–1.27)
Globulin, Total: 2.1 g/dL (ref 1.5–4.5)
Glucose: 133 mg/dL — ABNORMAL HIGH (ref 70–99)
Potassium: 4.3 mmol/L (ref 3.5–5.2)
Sodium: 140 mmol/L (ref 134–144)
Total Protein: 6.7 g/dL (ref 6.0–8.5)
eGFR: 63 mL/min/1.73

## 2024-07-07 LAB — LIPID PANEL W/O CHOL/HDL RATIO
Cholesterol, Total: 146 mg/dL (ref 100–199)
HDL: 71 mg/dL
LDL Chol Calc (NIH): 59 mg/dL (ref 0–99)
Triglycerides: 86 mg/dL (ref 0–149)
VLDL Cholesterol Cal: 16 mg/dL (ref 5–40)

## 2024-07-07 NOTE — Progress Notes (Signed)
 Contacted via MyChart  Good morning Treyven, your labs have returned: - CBC is overall stable with mildly low platelets, but this is stable overall. - Kidney function, creatinine and eGFR, remains normal. Liver function, AST and ALT, is showing ongoing elevations but no worsening. We will continue to monitor closely and may consider imaging of liver at future visit. - Lipid panel remains stable. Any questions? Keep being amazing!!  Thank you for allowing me to participate in your care.  I appreciate you. Kindest regards, Skyllar Notarianni

## 2024-07-14 ENCOUNTER — Encounter: Attending: Physician Assistant

## 2024-07-14 ENCOUNTER — Inpatient Hospital Stay: Payer: PPO | Attending: Radiation Oncology

## 2024-07-14 DIAGNOSIS — X58XXXD Exposure to other specified factors, subsequent encounter: Secondary | ICD-10-CM | POA: Insufficient documentation

## 2024-07-14 DIAGNOSIS — C61 Malignant neoplasm of prostate: Secondary | ICD-10-CM | POA: Insufficient documentation

## 2024-07-14 DIAGNOSIS — I87311 Chronic venous hypertension (idiopathic) with ulcer of right lower extremity: Secondary | ICD-10-CM | POA: Diagnosis not present

## 2024-07-14 DIAGNOSIS — E11622 Type 2 diabetes mellitus with other skin ulcer: Secondary | ICD-10-CM | POA: Diagnosis not present

## 2024-07-14 DIAGNOSIS — S80811D Abrasion, right lower leg, subsequent encounter: Secondary | ICD-10-CM | POA: Insufficient documentation

## 2024-07-14 DIAGNOSIS — L97818 Non-pressure chronic ulcer of other part of right lower leg with other specified severity: Secondary | ICD-10-CM | POA: Diagnosis not present

## 2024-07-14 LAB — PSA: Prostatic Specific Antigen: 0.16 ng/mL (ref 0.00–4.00)

## 2024-07-19 ENCOUNTER — Encounter: Admitting: Physician Assistant

## 2024-07-19 DIAGNOSIS — E11622 Type 2 diabetes mellitus with other skin ulcer: Secondary | ICD-10-CM | POA: Diagnosis not present

## 2024-07-21 ENCOUNTER — Ambulatory Visit
Admission: RE | Admit: 2024-07-21 | Discharge: 2024-07-21 | Disposition: A | Discharge status: Home / Self Care | Payer: PPO | Source: Ambulatory Visit | Attending: Radiation Oncology | Admitting: Radiation Oncology

## 2024-07-21 ENCOUNTER — Encounter: Payer: Self-pay | Admitting: Radiation Oncology

## 2024-07-21 VITALS — BP 116/72 | HR 73 | Temp 98.5°F | Resp 20 | Wt 196.6 lb

## 2024-07-21 DIAGNOSIS — R197 Diarrhea, unspecified: Secondary | ICD-10-CM | POA: Insufficient documentation

## 2024-07-21 DIAGNOSIS — Z923 Personal history of irradiation: Secondary | ICD-10-CM | POA: Diagnosis not present

## 2024-07-21 DIAGNOSIS — C61 Malignant neoplasm of prostate: Secondary | ICD-10-CM | POA: Diagnosis present

## 2024-07-21 NOTE — Progress Notes (Signed)
 Radiation Oncology Follow up Note  Name: Andrew Cobb   Date:   07/21/2024 MRN:  [id number] DOB: 27-Jun-1951    This 75 y.o. 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  He specifically denies any increased lower urinary tract symptoms diarrhea or fatigue.SABRA  His PSA continues to decline is now 0.16 down from 0.34 a year ago he is under excellent biochemical control of his disease.  Patient had a recent lumbar spine MRI's which I have reviewed showing multilevel degenerative changes of L2-S1.  There is also some eccentric partially calcified disc bulges at L2-3.  Patient is clinically doing well.  COMPLICATIONS OF TREATMENT: none  FOLLOW UP COMPLIANCE: keeps appointments   PHYSICAL EXAM:  BP 116/72   Pulse 73   Temp 98.5 F (36.9 C) (Tympanic)   Resp 20   Wt 196 lb 9.6 oz (89.2 kg)   BMI 28.21 kg/m  Well-developed well-nourished patient in NAD. HEENT reveals PERLA, EOMI, discs not visualized.  Oral cavity is clear. No oral mucosal lesions are identified. Neck is clear without evidence of cervical or supraclavicular adenopathy. Lungs are clear to A&P. Cardiac examination is essentially unremarkable with regular rate and rhythm without murmur rub or thrill. Abdomen is benign with no organomegaly or masses noted. Motor sensory and DTR levels are equal and symmetric in the upper and lower extremities. Cranial nerves II through XII are grossly intact. Proprioception is intact. No peripheral adenopathy or edema is identified. No motor or sensory levels are noted. Crude visual fields are within normal range.  RADIOLOGY  RESULTS: MRI scan reviewed compatible with above-stated findings  PLAN: Present time patient is under excellent biochemical control of his prostate cancer after 4 years of treatment.  I am going to turn follow-up care over to his GP I have asked that he perform a PSA every year.  Patient knows to call at anytime with any concerns.  I would like to take this opportunity to thank you for allowing me to participate in the care of your patient.SABRA Marcey Penton, MD

## 2024-07-26 ENCOUNTER — Encounter: Admitting: Physician Assistant

## 2024-07-26 DIAGNOSIS — E11622 Type 2 diabetes mellitus with other skin ulcer: Secondary | ICD-10-CM | POA: Diagnosis not present

## 2024-08-02 ENCOUNTER — Encounter: Admitting: Physician Assistant

## 2024-08-02 DIAGNOSIS — E11622 Type 2 diabetes mellitus with other skin ulcer: Secondary | ICD-10-CM | POA: Diagnosis not present

## 2024-08-08 ENCOUNTER — Other Ambulatory Visit: Payer: Self-pay | Admitting: Nurse Practitioner

## 2024-08-08 NOTE — Telephone Encounter (Signed)
 Copied from CRM #[id number]. Topic: Clinical - Medication Refill >> Aug 08, 2024 10:47 AM Tysheama G wrote: Medication: JARDIANCE  25 MG TABS tablet  Has the patient contacted their pharmacy? Yes (Agent: If no, request that the patient contact the pharmacy for the refill. If patient does not wish to contact the pharmacy document the reason why and proceed with request.) (Agent: If yes, when and what did the pharmacy advise?)  This is the patient's preferred pharmacy:    [name]) [place]  - [place], [place] - [address] [place] [address] Phone: [phone] Fax: [phone]   Is this the correct pharmacy for this prescription? Yes If no, delete pharmacy and type the correct one.   Has the prescription been filled recently? No  Is the patient out of the medication? No  Has the patient been seen for an appointment in the last year OR does the patient have an upcoming appointment? Yes  Can we respond through MyChart? Yes  Agent: Please be advised that Rx refills may take up to 3 business days. We ask that you follow-up with your pharmacy.

## 2024-08-09 ENCOUNTER — Telehealth: Payer: Self-pay

## 2024-08-09 MED ORDER — JARDIANCE 25 MG PO TABS: 25.0000 mg | ORAL_TABLET | Freq: Every day | ORAL | 1 refills | Status: AC

## 2024-08-09 NOTE — Telephone Encounter (Signed)
 Requested Prescriptions  Pending Prescriptions Disp Refills   JARDIANCE  25 MG TABS tablet 90 tablet 0    Sig: Take 1 tablet (25 mg total) by mouth daily.     Endocrinology:  Diabetes - SGLT2 Inhibitors Passed - 08/09/2024  2:59 PM      Passed - Cr in normal range and within 360 days    Creatinine  Date Value Ref Range Status  07/29/2011 0.99 0.60 - 1.30 mg/dL Final   Creatinine, Ser  Date Value Ref Range Status  07/06/2024 1.22 0.76 - 1.27 mg/dL Final         Passed - HBA1C is between 0 and 7.9 and within 180 days    HB A1C (BAYER DCA - WAIVED)  Date Value Ref Range Status  07/06/2024 6.7 (H) 4.8 - 5.6 % Final    Comment:             Prediabetes: 5.7 - 6.4          Diabetes: >6.4          Glycemic control for adults with diabetes: <7.0          Passed - eGFR in normal range and within 360 days    EGFR (African American)  Date Value Ref Range Status  07/29/2011 >60 >49mL/min Final   GFR calc Af Amer  Date Value Ref Range Status  08/27/2020 67 >59 mL/min/1.73 Final    Comment:    **In accordance with recommendations from the NKF-ASN Task force,**   [facility] is in the process of updating its eGFR calculation to the   2021 CKD-EPI creatinine equation that estimates kidney function   without a race variable.    EGFR (Non-African Amer.)  Date Value Ref Range Status  07/29/2011 >60 >59mL/min Final    Comment:    eGFR values <78mL/min/1.73 m2 may be an indication of chronic kidney disease (CKD). Calculated eGFR, using the MRDR Study equation, is useful in  patients with stable renal function. The eGFR calculation will not be reliable in acutely ill patients when serum creatinine is changing rapidly. It is not useful in patients on dialysis. The eGFR calculation may not be applicable to patients at the low and high extremes of body sizes, pregnant women, and vegetarians.    GFR, Estimated  Date Value Ref Range Status  12/11/2021 >60 >60 mL/min Final    Comment:     (NOTE) Calculated using the CKD-EPI Creatinine Equation (2021)    GFR  Date Value Ref Range Status  10/14/2019 49.88 (L) >60.00 mL/min Final   eGFR  Date Value Ref Range Status  07/06/2024 63 >59 mL/min/1.73 Final         Passed - Valid encounter within last 6 months    Recent Outpatient Visits           1 month ago Uncontrolled type 2 diabetes mellitus with hyperglycemia, without long-term current use of insulin  (HCC)   [facility] [place], [place] T, NP   1 month ago Cellulitis of right lower leg   [facility] [place], [place] T, NP   2 months ago Cellulitis of right lower leg   [facility] [place], [place] T, NP   2 months ago Cellulitis of right lower leg    [facility] [place], [place] T, NP   2 months ago Cellulitis of right lower leg    [facility] [place], Melanie DASEN, NP

## 2024-08-09 NOTE — Telephone Encounter (Signed)
 FYI to provider.   Copied from CRM [id number]. Topic: Clinical - Medication Question >> Aug 08, 2024 10:50 AM Andrew Cobb wrote: Reason for CRM: Patient wanted to let Dr.[name] know that his neurologist took him off his levETIRAcetam  (KEPPRA ) 250 MG tablet and put him lamoTRIgine  (LAMICTAL ) 100 MG tablet

## 2024-08-16 ENCOUNTER — Encounter: Admitting: Physician Assistant

## 2024-10-20 ENCOUNTER — Encounter: Admitting: Nurse Practitioner

## 2024-11-24 ENCOUNTER — Ambulatory Visit

## 2025-01-19 ENCOUNTER — Inpatient Hospital Stay

## 2025-01-26 ENCOUNTER — Ambulatory Visit: Admitting: Radiation Oncology

## 2025-05-04 ENCOUNTER — Ambulatory Visit: Admitting: Adult Health
